# Patient Record
Sex: Female | Born: 1960 | Race: White | Hispanic: No | Smoking: Former smoker
Health system: Southern US, Community
[De-identification: ages and names within clinical notes are randomized; demographics above are authoritative.]

## PROBLEM LIST (undated history)

## (undated) DIAGNOSIS — I509 Heart failure, unspecified: Secondary | ICD-10-CM

## (undated) DIAGNOSIS — K589 Irritable bowel syndrome without diarrhea: Secondary | ICD-10-CM

## (undated) DIAGNOSIS — R0989 Other specified symptoms and signs involving the circulatory and respiratory systems: Secondary | ICD-10-CM

## (undated) DIAGNOSIS — R011 Cardiac murmur, unspecified: Secondary | ICD-10-CM

## (undated) DIAGNOSIS — K56609 Unspecified intestinal obstruction, unspecified as to partial versus complete obstruction: Secondary | ICD-10-CM

## (undated) DIAGNOSIS — I739 Peripheral vascular disease, unspecified: Secondary | ICD-10-CM

## (undated) DIAGNOSIS — Z5189 Encounter for other specified aftercare: Secondary | ICD-10-CM

## (undated) DIAGNOSIS — I350 Nonrheumatic aortic (valve) stenosis: Secondary | ICD-10-CM

## (undated) DIAGNOSIS — M1990 Unspecified osteoarthritis, unspecified site: Secondary | ICD-10-CM

## (undated) DIAGNOSIS — K219 Gastro-esophageal reflux disease without esophagitis: Secondary | ICD-10-CM

## (undated) DIAGNOSIS — E785 Hyperlipidemia, unspecified: Secondary | ICD-10-CM

## (undated) DIAGNOSIS — J449 Chronic obstructive pulmonary disease, unspecified: Secondary | ICD-10-CM

## (undated) DIAGNOSIS — F419 Anxiety disorder, unspecified: Secondary | ICD-10-CM

## (undated) DIAGNOSIS — I209 Angina pectoris, unspecified: Secondary | ICD-10-CM

## (undated) DIAGNOSIS — I219 Acute myocardial infarction, unspecified: Secondary | ICD-10-CM

## (undated) DIAGNOSIS — F209 Schizophrenia, unspecified: Secondary | ICD-10-CM

## (undated) DIAGNOSIS — J45909 Unspecified asthma, uncomplicated: Secondary | ICD-10-CM

## (undated) DIAGNOSIS — I2510 Atherosclerotic heart disease of native coronary artery without angina pectoris: Secondary | ICD-10-CM

## (undated) DIAGNOSIS — J439 Emphysema, unspecified: Secondary | ICD-10-CM

## (undated) DIAGNOSIS — H409 Unspecified glaucoma: Secondary | ICD-10-CM

## (undated) DIAGNOSIS — F329 Major depressive disorder, single episode, unspecified: Secondary | ICD-10-CM

## (undated) DIAGNOSIS — E559 Vitamin D deficiency, unspecified: Secondary | ICD-10-CM

## (undated) DIAGNOSIS — H269 Unspecified cataract: Secondary | ICD-10-CM

## (undated) DIAGNOSIS — R51 Headache: Secondary | ICD-10-CM

## (undated) DIAGNOSIS — C4490 Unspecified malignant neoplasm of skin, unspecified: Secondary | ICD-10-CM

## (undated) DIAGNOSIS — R519 Headache, unspecified: Secondary | ICD-10-CM

## (undated) DIAGNOSIS — C539 Malignant neoplasm of cervix uteri, unspecified: Secondary | ICD-10-CM

## (undated) DIAGNOSIS — R0602 Shortness of breath: Secondary | ICD-10-CM

## (undated) DIAGNOSIS — J189 Pneumonia, unspecified organism: Secondary | ICD-10-CM

## (undated) DIAGNOSIS — K859 Acute pancreatitis without necrosis or infection, unspecified: Secondary | ICD-10-CM

## (undated) DIAGNOSIS — G2581 Restless legs syndrome: Secondary | ICD-10-CM

## (undated) DIAGNOSIS — D649 Anemia, unspecified: Secondary | ICD-10-CM

## (undated) DIAGNOSIS — F319 Bipolar disorder, unspecified: Secondary | ICD-10-CM

## (undated) DIAGNOSIS — I10 Essential (primary) hypertension: Secondary | ICD-10-CM

## (undated) DIAGNOSIS — N185 Chronic kidney disease, stage 5: Secondary | ICD-10-CM

## (undated) DIAGNOSIS — R001 Bradycardia, unspecified: Secondary | ICD-10-CM

## (undated) DIAGNOSIS — Z86010 Personal history of colonic polyps: Secondary | ICD-10-CM

## (undated) DIAGNOSIS — G5600 Carpal tunnel syndrome, unspecified upper limb: Secondary | ICD-10-CM

## (undated) DIAGNOSIS — E079 Disorder of thyroid, unspecified: Secondary | ICD-10-CM

## (undated) DIAGNOSIS — K5792 Diverticulitis of intestine, part unspecified, without perforation or abscess without bleeding: Secondary | ICD-10-CM

## (undated) HISTORY — DX: Heart failure, unspecified: I50.9

## (undated) HISTORY — DX: Hyperlipidemia, unspecified: E78.5

## (undated) HISTORY — DX: Other specified symptoms and signs involving the circulatory and respiratory systems: R09.89

## (undated) HISTORY — DX: Unspecified cataract: H26.9

## (undated) HISTORY — DX: Unspecified asthma, uncomplicated: J45.909

## (undated) HISTORY — DX: Irritable bowel syndrome without diarrhea: K58.9

## (undated) HISTORY — DX: Schizophrenia, unspecified: F20.9

## (undated) HISTORY — DX: Acute pancreatitis without necrosis or infection, unspecified: K85.90

## (undated) HISTORY — DX: Unspecified intestinal obstruction, unspecified as to partial versus complete obstruction: K56.609

## (undated) HISTORY — DX: Nonrheumatic aortic (valve) stenosis: I35.0

## (undated) HISTORY — DX: Anemia, unspecified: D64.9

## (undated) HISTORY — DX: Unspecified glaucoma: H40.9

## (undated) HISTORY — DX: Unspecified osteoarthritis, unspecified site: M19.90

## (undated) HISTORY — DX: Emphysema, unspecified: J43.9

## (undated) HISTORY — DX: Restless legs syndrome: G25.81

## (undated) HISTORY — DX: Encounter for other specified aftercare: Z51.89

## (undated) HISTORY — DX: Unspecified malignant neoplasm of skin, unspecified: C44.90

## (undated) HISTORY — DX: Disorder of thyroid, unspecified: E07.9

## (undated) HISTORY — DX: Anxiety disorder, unspecified: F41.9

## (undated) HISTORY — DX: Personal history of colonic polyps: Z86.010

## (undated) HISTORY — DX: Gastro-esophageal reflux disease without esophagitis: K21.9

## (undated) HISTORY — DX: Malignant neoplasm of cervix uteri, unspecified: C53.9

## (undated) HISTORY — DX: Bipolar disorder, unspecified: F31.9

## (undated) HISTORY — PX: POLYPECTOMY: SHX149

## (undated) HISTORY — DX: Peripheral vascular disease, unspecified: I73.9

## (undated) HISTORY — DX: Vitamin D deficiency, unspecified: E55.9

## (undated) HISTORY — DX: Major depressive disorder, single episode, unspecified: F32.9

## (undated) HISTORY — DX: Bradycardia, unspecified: R00.1

## (undated) HISTORY — PX: UPPER GASTROINTESTINAL ENDOSCOPY: SHX188

## (undated) HISTORY — DX: Essential (primary) hypertension: I10

---

## 1983-10-02 DIAGNOSIS — C539 Malignant neoplasm of cervix uteri, unspecified: Secondary | ICD-10-CM

## 1983-10-02 HISTORY — DX: Malignant neoplasm of cervix uteri, unspecified: C53.9

## 1994-10-01 HISTORY — PX: RIGHT OOPHORECTOMY: SHX2359

## 1994-10-01 HISTORY — PX: APPENDECTOMY: SHX54

## 1995-10-02 HISTORY — PX: TOTAL ABDOMINAL HYSTERECTOMY: SHX209

## 1998-08-13 ENCOUNTER — Emergency Department (HOSPITAL_COMMUNITY): Admission: EM | Admit: 1998-08-13 | Discharge: 1998-08-13 | Payer: Self-pay | Admitting: Emergency Medicine

## 1998-08-13 ENCOUNTER — Encounter: Payer: Self-pay | Admitting: Emergency Medicine

## 1998-09-22 ENCOUNTER — Encounter: Admission: RE | Admit: 1998-09-22 | Discharge: 1998-09-22 | Payer: Self-pay | Admitting: Sports Medicine

## 1998-10-01 DIAGNOSIS — F32A Depression, unspecified: Secondary | ICD-10-CM

## 1998-10-01 DIAGNOSIS — J449 Chronic obstructive pulmonary disease, unspecified: Secondary | ICD-10-CM

## 1998-10-01 HISTORY — DX: Chronic obstructive pulmonary disease, unspecified: J44.9

## 1998-10-01 HISTORY — DX: Depression, unspecified: F32.A

## 1999-08-16 ENCOUNTER — Emergency Department (HOSPITAL_COMMUNITY): Admission: EM | Admit: 1999-08-16 | Discharge: 1999-08-16 | Payer: Self-pay | Admitting: Emergency Medicine

## 1999-09-15 ENCOUNTER — Encounter: Admission: RE | Admit: 1999-09-15 | Discharge: 1999-09-15 | Payer: Self-pay | Admitting: Family Medicine

## 1999-11-06 ENCOUNTER — Emergency Department (HOSPITAL_COMMUNITY): Admission: EM | Admit: 1999-11-06 | Discharge: 1999-11-06 | Payer: Self-pay | Admitting: Internal Medicine

## 1999-11-29 ENCOUNTER — Encounter: Admission: RE | Admit: 1999-11-29 | Discharge: 1999-11-29 | Payer: Self-pay | Admitting: Family Medicine

## 1999-12-07 ENCOUNTER — Ambulatory Visit (HOSPITAL_COMMUNITY): Admission: RE | Admit: 1999-12-07 | Discharge: 1999-12-07 | Payer: Self-pay | Admitting: Family Medicine

## 1999-12-07 ENCOUNTER — Encounter: Admission: RE | Admit: 1999-12-07 | Discharge: 1999-12-07 | Payer: Self-pay | Admitting: Family Medicine

## 1999-12-19 ENCOUNTER — Encounter: Admission: RE | Admit: 1999-12-19 | Discharge: 1999-12-19 | Payer: Self-pay | Admitting: Family Medicine

## 2000-01-01 ENCOUNTER — Ambulatory Visit (HOSPITAL_COMMUNITY): Admission: RE | Admit: 2000-01-01 | Discharge: 2000-01-01 | Payer: Self-pay | Admitting: *Deleted

## 2000-02-12 ENCOUNTER — Encounter: Admission: RE | Admit: 2000-02-12 | Discharge: 2000-02-12 | Payer: Self-pay | Admitting: Family Medicine

## 2001-05-22 ENCOUNTER — Encounter: Payer: Self-pay | Admitting: Emergency Medicine

## 2001-05-22 ENCOUNTER — Emergency Department (HOSPITAL_COMMUNITY): Admission: EM | Admit: 2001-05-22 | Discharge: 2001-05-22 | Payer: Self-pay | Admitting: Emergency Medicine

## 2002-04-28 ENCOUNTER — Emergency Department (HOSPITAL_COMMUNITY): Admission: EM | Admit: 2002-04-28 | Discharge: 2002-04-28 | Payer: Self-pay | Admitting: Emergency Medicine

## 2002-04-28 ENCOUNTER — Encounter: Payer: Self-pay | Admitting: Emergency Medicine

## 2002-11-05 ENCOUNTER — Ambulatory Visit (HOSPITAL_COMMUNITY): Admission: RE | Admit: 2002-11-05 | Discharge: 2002-11-05 | Payer: Self-pay | Admitting: Family Medicine

## 2002-11-06 ENCOUNTER — Emergency Department (HOSPITAL_COMMUNITY): Admission: EM | Admit: 2002-11-06 | Discharge: 2002-11-06 | Payer: Self-pay

## 2003-02-25 ENCOUNTER — Ambulatory Visit (HOSPITAL_COMMUNITY): Admission: RE | Admit: 2003-02-25 | Discharge: 2003-02-25 | Payer: Self-pay | Admitting: Orthopedic Surgery

## 2003-02-25 ENCOUNTER — Encounter: Payer: Self-pay | Admitting: Orthopedic Surgery

## 2003-03-17 ENCOUNTER — Ambulatory Visit (HOSPITAL_COMMUNITY): Admission: RE | Admit: 2003-03-17 | Discharge: 2003-03-17 | Payer: Self-pay | Admitting: Family Medicine

## 2003-12-25 ENCOUNTER — Emergency Department (HOSPITAL_COMMUNITY): Admission: EM | Admit: 2003-12-25 | Discharge: 2003-12-25 | Payer: Self-pay | Admitting: *Deleted

## 2004-02-16 ENCOUNTER — Ambulatory Visit (HOSPITAL_COMMUNITY): Admission: RE | Admit: 2004-02-16 | Discharge: 2004-02-16 | Payer: Self-pay | Admitting: Family Medicine

## 2004-06-13 ENCOUNTER — Ambulatory Visit: Payer: Self-pay | Admitting: Family Medicine

## 2004-06-16 ENCOUNTER — Ambulatory Visit (HOSPITAL_COMMUNITY): Admission: RE | Admit: 2004-06-16 | Discharge: 2004-06-16 | Payer: Self-pay | Admitting: Family Medicine

## 2004-09-02 ENCOUNTER — Encounter: Payer: Self-pay | Admitting: Emergency Medicine

## 2004-09-03 ENCOUNTER — Inpatient Hospital Stay (HOSPITAL_COMMUNITY): Admission: EM | Admit: 2004-09-03 | Discharge: 2004-09-07 | Payer: Self-pay | Admitting: Psychiatry

## 2004-09-03 ENCOUNTER — Ambulatory Visit: Payer: Self-pay | Admitting: Psychiatry

## 2004-09-13 ENCOUNTER — Ambulatory Visit: Payer: Self-pay | Admitting: Family Medicine

## 2004-09-13 ENCOUNTER — Other Ambulatory Visit: Admission: RE | Admit: 2004-09-13 | Discharge: 2004-09-13 | Payer: Self-pay | Admitting: Family Medicine

## 2004-10-24 ENCOUNTER — Ambulatory Visit: Payer: Self-pay | Admitting: Gastroenterology

## 2004-10-25 ENCOUNTER — Ambulatory Visit: Payer: Self-pay | Admitting: Gastroenterology

## 2004-10-27 ENCOUNTER — Ambulatory Visit: Payer: Self-pay | Admitting: Gastroenterology

## 2005-01-26 ENCOUNTER — Ambulatory Visit: Payer: Self-pay | Admitting: Family Medicine

## 2005-05-18 ENCOUNTER — Ambulatory Visit: Payer: Self-pay | Admitting: Family Medicine

## 2006-07-09 ENCOUNTER — Emergency Department (HOSPITAL_COMMUNITY): Admission: EM | Admit: 2006-07-09 | Discharge: 2006-07-09 | Payer: Self-pay | Admitting: Emergency Medicine

## 2006-10-01 DIAGNOSIS — D649 Anemia, unspecified: Secondary | ICD-10-CM

## 2006-10-01 DIAGNOSIS — K589 Irritable bowel syndrome without diarrhea: Secondary | ICD-10-CM

## 2006-10-01 DIAGNOSIS — K5792 Diverticulitis of intestine, part unspecified, without perforation or abscess without bleeding: Secondary | ICD-10-CM

## 2006-10-01 DIAGNOSIS — K56609 Unspecified intestinal obstruction, unspecified as to partial versus complete obstruction: Secondary | ICD-10-CM

## 2006-10-01 HISTORY — DX: Anemia, unspecified: D64.9

## 2006-10-01 HISTORY — PX: COLON SURGERY: SHX602

## 2006-10-01 HISTORY — DX: Diverticulitis of intestine, part unspecified, without perforation or abscess without bleeding: K57.92

## 2006-10-01 HISTORY — DX: Unspecified intestinal obstruction, unspecified as to partial versus complete obstruction: K56.609

## 2006-10-01 HISTORY — DX: Irritable bowel syndrome, unspecified: K58.9

## 2006-10-06 ENCOUNTER — Inpatient Hospital Stay (HOSPITAL_COMMUNITY): Admission: EM | Admit: 2006-10-06 | Discharge: 2006-10-13 | Payer: Self-pay | Admitting: Emergency Medicine

## 2006-11-01 ENCOUNTER — Ambulatory Visit (HOSPITAL_COMMUNITY): Admission: RE | Admit: 2006-11-01 | Discharge: 2006-11-01 | Payer: Self-pay | Admitting: General Surgery

## 2006-11-01 ENCOUNTER — Ambulatory Visit: Payer: Self-pay | Admitting: Family Medicine

## 2006-11-06 ENCOUNTER — Ambulatory Visit (HOSPITAL_COMMUNITY): Admission: RE | Admit: 2006-11-06 | Discharge: 2006-11-06 | Payer: Self-pay | Admitting: Family Medicine

## 2006-11-25 ENCOUNTER — Ambulatory Visit: Payer: Self-pay | Admitting: Family Medicine

## 2006-12-11 ENCOUNTER — Ambulatory Visit (HOSPITAL_COMMUNITY): Admission: RE | Admit: 2006-12-11 | Discharge: 2006-12-11 | Payer: Self-pay | Admitting: Family Medicine

## 2006-12-26 ENCOUNTER — Encounter: Admission: RE | Admit: 2006-12-26 | Discharge: 2006-12-26 | Payer: Self-pay | Admitting: Family Medicine

## 2007-01-04 ENCOUNTER — Inpatient Hospital Stay (HOSPITAL_COMMUNITY): Admission: EM | Admit: 2007-01-04 | Discharge: 2007-01-15 | Payer: Self-pay | Admitting: Emergency Medicine

## 2007-01-20 ENCOUNTER — Ambulatory Visit (HOSPITAL_COMMUNITY): Admission: RE | Admit: 2007-01-20 | Discharge: 2007-01-20 | Payer: Self-pay | Admitting: General Surgery

## 2007-03-03 ENCOUNTER — Inpatient Hospital Stay (HOSPITAL_COMMUNITY): Admission: RE | Admit: 2007-03-03 | Discharge: 2007-03-07 | Payer: Self-pay | Admitting: General Surgery

## 2007-03-03 ENCOUNTER — Encounter (INDEPENDENT_AMBULATORY_CARE_PROVIDER_SITE_OTHER): Payer: Self-pay | Admitting: General Surgery

## 2007-03-26 ENCOUNTER — Ambulatory Visit: Payer: Self-pay | Admitting: Family Medicine

## 2007-03-27 ENCOUNTER — Ambulatory Visit (HOSPITAL_COMMUNITY): Admission: RE | Admit: 2007-03-27 | Discharge: 2007-03-27 | Payer: Self-pay | Admitting: Family Medicine

## 2007-05-26 ENCOUNTER — Ambulatory Visit: Payer: Self-pay | Admitting: Internal Medicine

## 2007-06-13 ENCOUNTER — Ambulatory Visit (HOSPITAL_COMMUNITY): Admission: RE | Admit: 2007-06-13 | Discharge: 2007-06-13 | Payer: Self-pay | Admitting: Family Medicine

## 2007-10-02 DIAGNOSIS — Z86010 Personal history of colonic polyps: Secondary | ICD-10-CM

## 2007-10-02 HISTORY — DX: Personal history of colonic polyps: Z86.010

## 2008-05-11 ENCOUNTER — Emergency Department (HOSPITAL_COMMUNITY): Admission: EM | Admit: 2008-05-11 | Discharge: 2008-05-11 | Payer: Self-pay | Admitting: Emergency Medicine

## 2008-05-17 DIAGNOSIS — F17210 Nicotine dependence, cigarettes, uncomplicated: Secondary | ICD-10-CM | POA: Diagnosis present

## 2008-07-13 ENCOUNTER — Encounter: Payer: Self-pay | Admitting: Gastroenterology

## 2008-08-09 DIAGNOSIS — F329 Major depressive disorder, single episode, unspecified: Secondary | ICD-10-CM

## 2008-08-09 DIAGNOSIS — K5730 Diverticulosis of large intestine without perforation or abscess without bleeding: Secondary | ICD-10-CM | POA: Insufficient documentation

## 2008-08-09 DIAGNOSIS — R519 Headache, unspecified: Secondary | ICD-10-CM | POA: Insufficient documentation

## 2008-08-09 DIAGNOSIS — J45909 Unspecified asthma, uncomplicated: Secondary | ICD-10-CM | POA: Insufficient documentation

## 2008-08-09 DIAGNOSIS — K449 Diaphragmatic hernia without obstruction or gangrene: Secondary | ICD-10-CM | POA: Insufficient documentation

## 2008-08-09 DIAGNOSIS — Z860100 Personal history of colon polyps, unspecified: Secondary | ICD-10-CM | POA: Insufficient documentation

## 2008-08-09 DIAGNOSIS — Z85828 Personal history of other malignant neoplasm of skin: Secondary | ICD-10-CM | POA: Insufficient documentation

## 2008-08-09 DIAGNOSIS — K219 Gastro-esophageal reflux disease without esophagitis: Secondary | ICD-10-CM

## 2008-08-09 DIAGNOSIS — R51 Headache: Secondary | ICD-10-CM

## 2008-08-09 DIAGNOSIS — M129 Arthropathy, unspecified: Secondary | ICD-10-CM | POA: Insufficient documentation

## 2008-08-09 DIAGNOSIS — I10 Essential (primary) hypertension: Secondary | ICD-10-CM | POA: Insufficient documentation

## 2008-08-09 DIAGNOSIS — K222 Esophageal obstruction: Secondary | ICD-10-CM | POA: Insufficient documentation

## 2008-08-09 DIAGNOSIS — F411 Generalized anxiety disorder: Secondary | ICD-10-CM

## 2008-08-10 ENCOUNTER — Ambulatory Visit: Payer: Self-pay | Admitting: Gastroenterology

## 2008-08-10 DIAGNOSIS — K565 Intestinal adhesions [bands], unspecified as to partial versus complete obstruction: Secondary | ICD-10-CM

## 2008-08-10 DIAGNOSIS — Z8679 Personal history of other diseases of the circulatory system: Secondary | ICD-10-CM | POA: Insufficient documentation

## 2008-08-10 DIAGNOSIS — K589 Irritable bowel syndrome without diarrhea: Secondary | ICD-10-CM

## 2008-08-10 DIAGNOSIS — R195 Other fecal abnormalities: Secondary | ICD-10-CM | POA: Insufficient documentation

## 2008-08-10 DIAGNOSIS — D509 Iron deficiency anemia, unspecified: Secondary | ICD-10-CM | POA: Insufficient documentation

## 2008-08-10 DIAGNOSIS — R1084 Generalized abdominal pain: Secondary | ICD-10-CM | POA: Insufficient documentation

## 2008-08-10 LAB — CONVERTED CEMR LAB
Eosinophils Relative: 3.6 % (ref 0.0–5.0)
HCT: 41.4 % (ref 36.0–46.0)
Iron: 60 ug/dL (ref 42–145)
Lymphocytes Relative: 42.6 % (ref 12.0–46.0)
Monocytes Absolute: 0.4 10*3/uL (ref 0.1–1.0)
Monocytes Relative: 6.9 % (ref 3.0–12.0)
Neutrophils Relative %: 46.3 % (ref 43.0–77.0)
Platelets: 349 10*3/uL (ref 150–400)
RDW: 12.5 % (ref 11.5–14.6)
Saturation Ratios: 15 % — ABNORMAL LOW (ref 20.0–50.0)
Transferrin: 286.4 mg/dL (ref >=212.0)
Vitamin B-12: 343 pg/mL (ref 211–911)
WBC: 6.1 10*3/uL (ref 4.5–10.5)

## 2008-09-02 ENCOUNTER — Encounter: Payer: Self-pay | Admitting: Gastroenterology

## 2008-09-02 ENCOUNTER — Ambulatory Visit (HOSPITAL_COMMUNITY): Admission: RE | Admit: 2008-09-02 | Discharge: 2008-09-02 | Payer: Self-pay | Admitting: Gastroenterology

## 2008-09-02 ENCOUNTER — Ambulatory Visit: Payer: Self-pay | Admitting: Gastroenterology

## 2008-09-05 ENCOUNTER — Encounter: Payer: Self-pay | Admitting: Gastroenterology

## 2008-11-09 ENCOUNTER — Encounter: Admission: RE | Admit: 2008-11-09 | Discharge: 2008-11-09 | Payer: Self-pay | Admitting: Family Medicine

## 2009-12-19 ENCOUNTER — Emergency Department (HOSPITAL_COMMUNITY): Admission: EM | Admit: 2009-12-19 | Discharge: 2009-12-19 | Payer: Self-pay | Admitting: Emergency Medicine

## 2010-12-20 ENCOUNTER — Emergency Department (HOSPITAL_COMMUNITY): Payer: Self-pay

## 2010-12-20 ENCOUNTER — Emergency Department (HOSPITAL_COMMUNITY)
Admission: EM | Admit: 2010-12-20 | Discharge: 2010-12-21 | Disposition: A | Discharge status: Home / Self Care | Payer: Self-pay | Attending: Emergency Medicine | Admitting: Emergency Medicine

## 2010-12-20 DIAGNOSIS — R062 Wheezing: Secondary | ICD-10-CM | POA: Insufficient documentation

## 2010-12-20 DIAGNOSIS — J3489 Other specified disorders of nose and nasal sinuses: Secondary | ICD-10-CM | POA: Insufficient documentation

## 2010-12-20 DIAGNOSIS — R509 Fever, unspecified: Secondary | ICD-10-CM | POA: Insufficient documentation

## 2010-12-20 DIAGNOSIS — R0989 Other specified symptoms and signs involving the circulatory and respiratory systems: Secondary | ICD-10-CM | POA: Insufficient documentation

## 2010-12-20 DIAGNOSIS — R059 Cough, unspecified: Secondary | ICD-10-CM | POA: Insufficient documentation

## 2010-12-20 DIAGNOSIS — J438 Other emphysema: Secondary | ICD-10-CM | POA: Insufficient documentation

## 2010-12-20 DIAGNOSIS — R05 Cough: Secondary | ICD-10-CM | POA: Insufficient documentation

## 2010-12-20 DIAGNOSIS — R079 Chest pain, unspecified: Secondary | ICD-10-CM | POA: Insufficient documentation

## 2010-12-20 DIAGNOSIS — R0609 Other forms of dyspnea: Secondary | ICD-10-CM | POA: Insufficient documentation

## 2010-12-20 DIAGNOSIS — J40 Bronchitis, not specified as acute or chronic: Secondary | ICD-10-CM | POA: Insufficient documentation

## 2010-12-20 LAB — BASIC METABOLIC PANEL
BUN: 4 mg/dL — ABNORMAL LOW (ref 6–23)
CO2: 24 mEq/L (ref 19–32)
Glucose, Bld: 98 mg/dL (ref 70–99)
Potassium: 4 mEq/L (ref 3.5–5.1)
Sodium: 137 mEq/L (ref 135–145)

## 2010-12-20 LAB — CBC
HCT: 40.5 % (ref 36.0–46.0)
MCHC: 34.3 g/dL (ref 30.0–36.0)
MCV: 94 fL (ref 78.0–100.0)
Platelets: 399 10*3/uL (ref 150–400)
RDW: 12.9 % (ref 11.5–15.5)

## 2010-12-20 LAB — DIFFERENTIAL
Basophils Absolute: 0.1 10*3/uL (ref 0.0–0.1)
Eosinophils Absolute: 0.1 10*3/uL (ref 0.0–0.7)
Eosinophils Relative: 1 % (ref 0–5)
Lymphocytes Relative: 43 % (ref 12–46)
Lymphs Abs: 3.7 10*3/uL (ref 0.7–4.0)
Monocytes Absolute: 0.8 10*3/uL (ref 0.1–1.0)

## 2010-12-21 LAB — POCT CARDIAC MARKERS
CKMB, poc: <1 ng/mL — ABNORMAL LOW (ref 1.0–8.0)
Troponin i, poc: <0.05 ng/mL (ref 0.00–0.09)

## 2010-12-25 LAB — CBC
HCT: 38 % (ref 36.0–46.0)
Hemoglobin: 12.7 g/dL (ref 12.0–15.0)
Platelets: 375 10*3/uL (ref 150–400)
RBC: 3.93 MIL/uL (ref 3.87–5.11)
WBC: 6.7 10*3/uL (ref 4.0–10.5)

## 2010-12-25 LAB — POCT CARDIAC MARKERS
CKMB, poc: <1 ng/mL — ABNORMAL LOW (ref 1.0–8.0)
CKMB, poc: <1 ng/mL — ABNORMAL LOW (ref 1.0–8.0)
Troponin i, poc: <0.05 ng/mL (ref 0.00–0.09)

## 2010-12-25 LAB — BASIC METABOLIC PANEL
GFR calc Af Amer: >60 mL/min (ref >60)
GFR calc non Af Amer: >60 mL/min (ref >60)
Potassium: 4.3 mEq/L (ref 3.5–5.1)
Sodium: 138 mEq/L (ref 135–145)

## 2010-12-25 LAB — DIFFERENTIAL
Eosinophils Relative: 2 % (ref 0–5)
Lymphocytes Relative: 38 % (ref 12–46)
Lymphs Abs: 2.6 10*3/uL (ref 0.7–4.0)
Monocytes Absolute: 0.4 10*3/uL (ref 0.1–1.0)
Monocytes Relative: 7 % (ref 3–12)

## 2010-12-25 LAB — D-DIMER, QUANTITATIVE: D-Dimer, Quant: 0.4 ug/mL-FEU (ref 0.00–0.48)

## 2011-02-13 NOTE — Consult Note (Signed)
[name], [name]              ACCOUNT NO.:  [id number]   MEDICAL RECORD NO.:  [id number]          PATIENT TYPE:  INP   LOCATION:  [id number]                         FACILITY:  [facility]   PHYSICIAN:  Felizardo Hoffmann, M.D.  DATE OF BIRTH:  05-06-1961   DATE OF CONSULTATION:  03/06/2007  DATE OF DISCHARGE:                                 CONSULTATION   REASON FOR CONSULTATION:  Anxiety and depression.   REQUESTING PHYSICIAN:  Gwenyth Ober, M.D.   HISTORY OF PRESENT ILLNESS:  [name] is a 50 year old female admitted  to [facility] on Feb 17, 2007, due to ongoing problems with  diverticulitis.   Ms. Sistrunk underwent a segmental sigmoid colectomy on March 03, 2007.  She  also had an appendectomy that day and enterolysis. ices   She has insomnia.  Her mood is within normal limits.  She has intact  interests and constructive future goals, however, her energy is  decreased.  She has no thoughts of harming herself or others.  She has  no delusions or hallucinations.  She did not continue her trazodone as  an outpatient.   She also did not continue Cymbalta.  The Cymbalta has just been  restarted this week.  She is not having any adverse medication effects.   PAST PSYCHIATRIC HISTORY:  [name] has no history of increased energy  or decreased need for sleep.  She has a history of several periods of  major depression including anhedonia, poor energy, depressed mood and  difficulty concentrating.   She has undergone psychiatric admission at the [facility] in December 2005.  She underwent treatment for major  depression.  She was treated with Effexor in the past.  She has also  received trazodone.  She was given Seroquel and 150 mg nightly in 2005.  She does not have a history of known psychosis, so evidently, the  Seroquel was for severe anxiety and insomnia.  However, this cannot be  confirmed.   The patient most recently has responded to Cymbalta at  60 mg daily, 50  mg of trazodone nightly, partially helping her with insomnia.   FAMILY PSYCHIATRIC HISTORY:  Alcoholism in several members.   SOCIAL HISTORY:  The patient is divorced.  She has been living alone.  She is to work part-time in Scientist, research (medical), however, she is medically disabled  now.  She has a history of rare use of alcohol without complications.  She used to use marijuana in the past, but no other illegal drugs.  She  does not use marijuana now.  She has one grown daughter.  Education is  through the ninth grade.  Religion is [employer].   PAST MEDICAL HISTORY:  1. Chronic diverticulitis, status post segmental sigmoid colectomy.  2. Status post appendectomy.  3. Status post enterolysis.   MEDICATIONS:  The MAR is reviewed.  The patient is on Cymbalta 60 mg  daily.   ALLERGIES:  VICODIN.   LABORATORY DATA:  Electrolytes are unremarkable with BUN 1, creatinine  0.61.  WBC 8.3, hemoglobin 14.3, platelet count 456.   REVIEW  OF SYSTEMS:  CONSTITUTIONAL:  Afebrile.  HEAD:  No trauma.  EYES:  No visual changes.  EARS:  No hearing impairment.  NOSE:  No rhinorrhea.  THROAT:  No sore throat.  NEUROLOGIC:  Unremarkable.  PSYCHIATRIC:  As  above.  CARDIOVASCULAR:  No chest pain, palpitations or edema.  RESPIRATORY:  No coughing or wheezing.  GASTROINTESTINAL:  As above.  The patient has slight loose stools now, but her stools are becoming  more solid.  GENITOURINARY:  No dysuria.  SKIN:  Unremarkable.  HEMATOLOGIC/LYMPHATIC:  Unremarkable.  MUSCULOSKELETAL:  No deformities.  ENDOCRINE/METABOLIC unremarkable.   PHYSICAL EXAMINATION:  VITAL SIGNS:  Temperature 98.6, pulse 74,  respirations 16, blood pressure 137/75, O2 saturation on room air 98%.   MENTAL STATUS EXAM:  Ms. Stbernard is a middle-aged female partially  reclined in supine position in her hospital bed with good eye contact.  She is alert.  She is socially appropriate and cooperative.  She is  oriented to all spheres.  Her  memory is intact to immediate recent and  remote.  Fund of knowledge and intelligence are within normal limits.  Concentration is slightly decreased.  Affect is mildly constricted.  Mood is within normal limits.  Insight is intact.  Judgment is intact.   ASSESSMENT:  AXIS I:  1. (293.84) Anxiety disorder, not otherwise  specified.  1. (296.35) Major depressive disorder, recurrent, in partial      remission.  AXIS II:  Deferred.  AXIS III:  See general medical problems.  AXIS IV:  General medical.  AXIS V:  55.   IMPRESSION:  [name] is not at risk to harm herself or others.  She  agrees to call emergency services immediately for any thoughts of  harming herself, thoughts of harming others or distress.   The undersigned provided ego supportive psychotherapy and education.   The indications, alternatives and adverse effects of Cymbalta and  trazodone were discussed with the patient.  She understands and wants to  proceed as follows.   RECOMMENDATIONS:  1. Would continue Cymbalta 60 mg q.a.m. for antidepression.  Also, the      [place] component of Cymbalta should be effective in helping with      anxiety.  2. To help with insomnia and also augment Cymbalta, would utilize      trazodone and 50 mg nightly with 50 mg nightly p.r.n.  Would then      adjust trazodone as needed and as tolerated until sleep is normal      not to exceed 200      mg nightly.  3. Outpatient psychiatric followup can be obtained at one of the      clinics attached to [place] or [facility].      Felizardo Hoffmann, M.D.  Electronically Signed     JW/MEDQ  D:  03/09/2007  T:  03/10/2007  Job:  [id number]

## 2011-02-13 NOTE — Discharge Summary (Signed)
[name], [name]              ACCOUNT NO.:  [id number]   MEDICAL RECORD NO.:  [id number]          PATIENT TYPE:  INP   LOCATION:  [id number]                         FACILITY:  [facility]   PHYSICIAN:  Gwenyth Ober, M.D.    DATE OF BIRTH:  Aug 01, 1961   DATE OF ADMISSION:  03/03/2007  DATE OF DISCHARGE:  03/07/2007                               DISCHARGE SUMMARY   DISCHARGE DIAGNOSIS:  Chronic and acute diverticulitis.   ADDITIONAL DIAGNOSES:  Include:  1. Depression.  2. Tobacco or nicotine dependence.   PRINCIPLE PROCEDURE:  Was a sigmoid colectomy with primary anastomosis  done by Dr. Hulen Skains.   DISCHARGE MEDICATIONS:  1. Percocet 10/325 mg with acetaminophen, 1 tablet orally every 4      hours as needed for pain.  2. Reglan 2 mg, 1 tablet twice a day.  3. Trazodone 100 mg 1 tablet at bedtime.  4. Cymbalta 60 mg 1 tablet p.o. daily.  5. Nicotine patch 1 mg, one topically every day.   FOLLOW-UP:  To see Dr. Hulen Skains in two weeks.  She is to have her staples  removed and Steri-Strips applied today, prior to discharge.  She is to  do activity wise and no heavy lifting for another 4 weeks.  Should  return to work in 3 weeks with light duty, increase her activity slowly.  She can shower and pat her wound dry.   BRIEF SUMMARY OF HOSPITAL COURSE:  The patient was admitted on the day  of surgery on March 03, 2007, at which time she underwent an sigmoid  colectomy with colon with0 a primary anastomosis.  The procedure was  uneventful.  She had a hand-sewn anastomosis.  Post-op day #1, she was  started on sips of clear liquids and advanced to full clear liquids on  post-op day #2.  She was advanced to full liquids, and then a soft diet  and post-op day #3.   Dr. Rhona Raider saw her in follow-up for her previous depression related  to some home issues.  He recommended increasing her trazodone to 100 mg  at bedtime.  She was already on Cymbalta and 50 mg of trazodone prior to  that.  Post-op day  #4, she was doing well.  Her wound was __________  well with no evidence of infection.  Staples were to be removed and  Steri-Strips applied.  She was to follow up to see Dr. Hulen Skains in 2 weeks.   DIET ON DISCHARGE:  Soft.  She can advance that to a regular diet.   Condition is stable.      Gwenyth Ober, M.D.  Electronically Signed     JOW/MEDQ  D:  03/07/2007  T:  03/07/2007  Job:  [id number]

## 2011-02-13 NOTE — Op Note (Signed)
NAMEDANNELLE, BARGERSTOCK              ACCOUNT NO.:  [id number]   MEDICAL RECORD NO.:  [id number]          PATIENT TYPE:  INP   LOCATION:  2550                         FACILITY:  [facility]   PHYSICIAN:  Gwenyth Ober, M.D.    DATE OF BIRTH:  1961/05/24   DATE OF PROCEDURE:  03/03/2007  DATE OF DISCHARGE:                               OPERATIVE REPORT   PREOP DIAGNOSIS:  Sigmoid diverticulitis.   POSTOP DIAGNOSIS:  1. Sigmoid diverticulitis, chronic.  2. Multiple lower abdominal adhesions.  3. Retrocecal appendage.   PROCEDURE:  1. Segmental sigmoid colectomy.  2. Appendectomy.  3. Enterolysis.   SURGEON:  Gwenyth Ober, M.D.   ASSISTANT:  Jacelyn Pi, M.D.   ANESTHESIA:  General endotracheal.   ESTIMATED BLOOD LOSS:  Less than 50 mL.   COMPLICATIONS:  None.   CONDITION:  Stable.   INDICATIONS FOR OPERATION:  The patient is a 50 year old with a history  of substance abuse, but prior admissions for diverticulitis who comes in  for sigmoid colectomy.   FINDINGS:  [name] mid-to-distal sigmoid colon was chronically  inflamed and scarred and adhesed to the left pelvic brim.  We could  easily separate it from the ureter on that side.   The patient had multiple adhesions of the distal ileum in the right  lower quadrant, likely secondary to previous surgery for hysterectomy.  The patient also had a retrocecal appendix which because of its  position, if it had become inflamed may have been problematic.   OPERATION:  The patient was taken to the operating room and placed on he  table in the supine position.  After an adequate endotracheal anesthetic  was administered, she was prepped and draped in the usual sterile manner  thus closing the midline of the abdomen.   The incision was made from just the left of the umbilicus all the way  down to the pelvic brim; and was taken down through the midline fascia.  A Balfour retractor was placed and the patient was placed in  Trendelenburg position.   There were adhesions of small bowel to the right lower quadrant of the  pelvis which were taken down sharply with Metzenbaum scissors.  This  freed up the terminal ileum entering into the cecum which appeared to be  normal, but the appendix tracked out posteriorly and deeply  retroperitoneally; and, therefore, decision was made to remove it  because of the possibility of appendicitis; and its difficult position  retrocecal.  We mobilized the mesoappendix and then ligated at its base  with a 2-0 silk.  The base of the appendix was transected using a GIA-75  stapler which was used subsequently in order to transect the colon.   The area of the diverticulitis which had apparently been causing the  patient problems was adhesed to the pelvic brim.  We took it down with  Metzenbaum scissors; and also electrocautery.  Care was taken not to  injure the left ureter which could be visualized as a tract over the  iliac vessels.  We found a place in the proximal sigmoid colon where  we  came across it with a GIA-75 stapler.  The mesentery of the sigmoid  colon was taken using a LigaSure device with curved tips.  The distal  incision was just distal to the area of diverticulitis; and we came  across that area also with the GIA-75 stapler.  Again, the intervening  mesentery was taken with the LigaSure device.  We subsequently did a  hand-sewn, two-layer anastomosis between the proximal sigmoid; and the  distal sigmoid using Lembert back wall and anterior wall stitches of 3-0  silk; and also internal posterior wall running and then Connell  stitches, anteriorly, of 3-0 Vicryl.  A spring clamp was placed on the  proximal bowel during anastomosis with the bowel open.  The mesentery  was subsequently closed using interrupted 2-0 silk sutures.  Care was  taken not to snare any important structures during their closure.  The  ureter continued to be intact and uninjured.   We  changed our gloves and then irrigated the pelvis with warm saline  solution.  We then, removed the Balfour retractors; and closed the  abdomen using a running #1 PDS suture.  No drains were placed.  All  needle counts, sponge counts, and instrument counts were correct.  Sterile dressing was applied.      Gwenyth Ober, M.D.  Electronically Signed     JOW/MEDQ  D:  03/03/2007  T:  03/03/2007  Job:  [id number]   cc:   Felizardo Hoffmann, M.D.  Cherene Altes, M.D.
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
## 2011-02-16 NOTE — H&P (Signed)
[name], [name]              ACCOUNT NO.:  [id number]   MEDICAL RECORD NO.:  [id number]          PATIENT TYPE:  INP   LOCATION:  5707                         FACILITY:  [facility]   PHYSICIAN:  Lucy Chris, MD     DATE OF BIRTH:  December 17, 1960   DATE OF ADMISSION:  01/03/2007  DATE OF DISCHARGE:                              HISTORY & PHYSICAL   CHIEF COMPLAINT:  Abdominal pain.   HISTORY OF PRESENT ILLNESS:  The patient is a 50 year old woman with a  recent hospitalization in January of this year for diverticulitis with  microperforation and abscess formation, who presents to the emergency  room today because of two to three days of severe constipation and one  week of abdominal pain which she describes as sharp, severe, with  radiation throughout the abdomen.  She is unable to note anything that  makes it better or worse.  She does have two to three episodes of emesis  over the past week, but evidently this is not new for her.  She also  reports subjective fevers and hematochezia.  She denies any hematemesis,  though.   PAST MEDICAL HISTORY:  1. Sigmoid diverticulitis with microperforation and abscess in January      of 2008.  2. Irritable bowel syndrome.  3. Depression.  4. Right clavicular fracture.  5. Chronic obstructive pulmonary disease not currently on any      medications.  6. Status post total abdominal hysterectomy.  7. Normocytic anemia with hemoglobin of 11.1 during her      hospitalization in January.   FAMILY HISTORY:  Father is currently living, with history of  bone  cancer.  Mother is deceased from emphysema.   SOCIAL HISTORY:  She is divorced, lives alone, is currently seeking  Medicaid.  She does work part time in Scientist, research (medical).  She continues to smoke  two to three packs per day, and denies any alcohol use.   CURRENT MEDICATIONS:  None.   DRUG ALLERGIES:  No known drug allergies.   DRUG INTOLERANCES:  Vicodin causes nausea.   REVIEW OF SYSTEMS:  As per  HPI.  Positive for subjective fevers, chills,  fatigue.  Negative for chest pain, shortness of breath, cough, lower  extremity swelling, orthopnea, nocturia. Positive for abdominal pain,  mild nausea, emesis - this does not appear to be new.  Positive for  hematochezia.  Negative for melena.  Negative for hematemesis.  Negative  for dysuria.   PHYSICAL EXAMINATION:  VITAL SIGNS:  Temperature 97.8, pulse 68, blood  pressure 105/60, respiratory rate 18.  The patient is satting 97% on  room air.  GENERAL:  She appears older than stated age.  She is in no acute  distress.  CARDIOVASCULAR:  Distant, but regular rate and rhythm with no  appreciable murmurs, rubs, or gallops.  RESPIRATORY:  Respirations are with bibasilar rhonchi, with fair air  movement.  ABDOMEN:  Decreased bowel sounds, soft, diffuse tenderness to palpation,  greatest in the left lower quadrant.  No rebound, no guarding.  GU EXAM:  Deferred.  EXTREMITIES:  Without any edema or cyanosis.  PSYCHIATRIC EXAM:  She is very tearful and tangential, with slightly  pressured speech.   LABORATORY STUDIES:  She has a white blood cell count of 16 with a left  shift.  She has a hemoglobin of 14.6, platelet count of 340,000.  Sodium  139, potassium 3.9, chloride 108, bicarb 24, BUN 1, creatinine 0.5.  LFTs are all within normal limits.  Anion gap is 7.  Urinalysis is  negative.  CT of the pelvis questions recurrent diverticulitis and  possible underlying sigmoid cancer.   ASSESSMENT AND PLAN:  1. Diverticulitis.  Acutely, we will admit her to the hospital, keep      her n.p.o., hydrate with normal saline, and treat with      ciprofloxacin and metronidazole as this is the patient's second      attack in a relatively short period of time.  She will likely      require a surgical evaluation after this acute episode to discuss      removing part of the sigmoid colon.  Evidently, this has been      broached with her at some point  already.  2. Rhonchi on exam.  The patient appears to be asymptomatic.  She does      not have any wheezing to indicate this is from a chronic      obstructive pulmonary disease exacerbation.  Will start with a      chest x-ray and proceed from there.  Of note, she is currently      satting very well on room air and, again, is asymptomatic with this      physical exam finding.  3. Chronic obstructive pulmonary disease.  Again, appears to be      stable, although she does have some rhonchi on exam.  Will start      with a chest x-ray and follow her from there.  4. Chronic tobacco use.  Will ask for a smoking cessation consult and      encourage cessation while she is in the hospital.      Lucy Chris, MD  Electronically Signed     KK/MEDQ  D:  01/04/2007  T:  01/04/2007  Job:  [id number]

## 2011-02-16 NOTE — Discharge Summary (Signed)
NAMEEBONEE, OLEARY              ACCOUNT NO.:  [id number]   MEDICAL RECORD NO.:  [id number]          PATIENT TYPE:  INP   LOCATION:  3021                         FACILITY:  [facility]   PHYSICIAN:  Cherene Altes, M.D.DATE OF BIRTH:  Feb 07, 1961   DATE OF ADMISSION:  10/06/2006  DATE OF DISCHARGE:  10/13/2006                               DISCHARGE SUMMARY   PRIMARY CARE PHYSICIAN:  [facility].   GENERAL SURGEON:  Gwenyth Ober, M.D.   DISCHARGE DIAGNOSES:  1. Sigmoid diverticulitis with micro perforation and abscess.      a.     Clinical symptoms significantly improved.      b.     Confirmed resolving abscess by followup CT scan.  2. Irritable bowel syndrome.  3. History of depression with previous suicidal ideation.  4. Fracture of the right clavicle, October, under ongoing care by      orthopedic surgery.  5. Chronic obstructive pulmonary disease with ongoing tobacco abuse.  6. Status post total abdominal hysterectomy.  7. Normocytic anemia felt to be secondary to gastrointestinal blood      loss related to diverticular bleeding, will need long term      outpatient followup.   DISCHARGE MEDICATIONS:  1. Ciprofloxacin 500 mg b.i.d. for 10 days then stop.  2. Flagyl 500 mg p.o. t.i.d. for 10 days then stop.  3. Percocet 2 tablets every four hours p.r.n. pain.   FOLLOWUP:  1. The patient will follow up with Dr. Judeth Horn at [facility] in 10-14 days for a recheck.  2. The patient is also advised to follow up at [facility] in 7-14 days for routine medical followup and re-evaluation      of her anemia.   PROCEDURES:  1. A CT scan of the abdomen and pelvis on October 08, 2006:  Sigmoid      colon diverticulitis and small adjacent diverticular abscess of      approximately 12 x 16-mm with normal-appearing appendix.  2. A CT scan of the abdomen and pelvis on October 12, 2006:  Improving      diverticulitis and diminished abscess  size.   HISTORY OF PRESENT ILLNESS:  For details concerning the patient's  admission, please see history and physical dictated by Dr. Gwynneth Aliment on  October 06, 2006.   HOSPITAL COURSE:  Ms. Charidy Helmus is a pleasant 50 year old female  with a known history of diverticulosis diagnosed via colonoscopy in  2005.  She has suffered with at least 2-3 episodes of diverticulitis in  the interim since that time.  She usually follows a very close  diverticular appropriate diet.  Over the last week to two weeks, she has  had significant social stress and has become noncompliant with her diet.  She then began to notice the insidious onset of crampy left lower  quadrant abdominal pain.  She ultimately presented to the hospital  because of progression of pain and some episodes of bright red blood per  rectum.  A CT scan of the abdomen revealed significant sigmoid  diverticulitis with a diverticular abscess.  The patient was kept on NPO  status and antibiotics were initiated.  A surgery consult was  accomplished and agreed with medical therapy.  With IV antibiotics and  judicious diet restrictions, the patient improved significantly.  By  October 12, 2006, the patient's diet was able to be advanced to regular.  A CT scan of the abdomen on the same day revealed a decrease in size of  the small abscess and evidence of improving sigmoid diverticulitis.   The patient is to be discharged on a strict diverticular appropriate  diet.  She will complete 10 additional days or oral antibiotics.  She  will follow up with Dr. Hulen Skains as described above for consideration of  possible elective partial colectomy in the future.  She is also advised  to follow up with her primary care physician at [facility] for ongoing  medical care.   During this hospitalization, it was appreciated that the patient was  mildly anemic with a hemoglobin ranging from 11.5 to 12.  MCV was at the  upper limits of normal.  B12 was  obtained and was found to be normal.  It is felt that this patient's anemia is likely an anemia of acute blood  loss due to her recent GI bleeding of a diverticular nature.  Outpatient  followup is recommended at [facility] with further management  options to be made based upon followup lab values.   The patient does have a known history of COPD and continues to smoke.  Tobacco cessation consultation was offered during this hospital stay.  At the time of her discharge, there was no evidence of active wheezing  and it was not felt that long term inhalers are a necessity at the  present time.      Cherene Altes, M.D.  Electronically Signed     JTM/MEDQ  D:  10/12/2006  T:  10/12/2006  Job:  [id number]   cc:   Rock Nephew, M.D.

## 2011-02-16 NOTE — Discharge Summary (Signed)
[name], [name]              ACCOUNT NO.:  [id number]   MEDICAL RECORD NO.:  [id number]          PATIENT TYPE:  INP   LOCATION:  [id number]                         FACILITY:  [facility]   PHYSICIAN:  Cherene Altes, M.D.DATE OF BIRTH:  [birth year]   DATE OF ADMISSION:  01/03/2007  DATE OF DISCHARGE:  01/15/2007                               DISCHARGE SUMMARY   ADDENDUM:   PRIMARY CARE PHYSICIAN:  Unassigned.   GENERAL SURGEON:  Gwenyth Ober, M.D.   PSYCHIATRIST:  Felizardo Hoffmann, M.D.   DISCHARGE MEDICATIONS:  1. Ciprofloxacin 500 mg p.o. b.i.d. for 10 days.  2. Flagyl 500 mg p.o. b.i.d. for 10 days.  3. Trazodone 50 mg q.h.s.  4. Cymbalta 20 mg daily.  5. Phenergan 25 mg q.6 h. p.r.n.  6. Percocet 5/325 one to two q.4-6 h. as needed with a maximum of 10      tablets per day.   FOLLOWUP:  The patient is instructed to follow up with Dr. Hulen Skains as he  is directed her.  She is to call his office at [phone] to arrange for  surgery in approximately 1 week.  She has discussed this plan herself  with Dr. Hulen Skains and assures me that she is aware of what her followup  issues are.   DISCHARGE DIAGNOSES:  No change from previously dictated discharge  summary.   HOSPITAL COURSE:  On January 15, 2007, the patient was deemed to be stable  for discharge home.  Her vital signs were stable and she was afebrile.  She was able to tolerate a regular diet.  Followup plans have been made  with Dr. Chucky May of general surgery for the patient to return in one  week for consideration of surgery.      Cherene Altes, M.D.  Electronically Signed     JTM/MEDQ  D:  01/15/2007  T:  01/15/2007  Job:  [id number]   cc:   Gwenyth Ober, M.D.  Felizardo Hoffmann, M.D.
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
## 2011-02-16 NOTE — Consult Note (Signed)
[name], Collins              ACCOUNT NO.:  [id number]   MEDICAL RECORD NO.:  [id number]          PATIENT TYPE:  INP   LOCATION:  [id number]                         FACILITY:  [facility]   PHYSICIAN:  Felizardo Hoffmann, M.D.  DATE OF BIRTH:  1961-05-18   DATE OF CONSULTATION:  01/07/2007  DATE OF DISCHARGE:                                 CONSULTATION   REQUESTING PHYSICIAN:  Mobolaji B. Maia Petties, M.D.   REASON FOR CONSULTATION:  Depression, insomnia.   HISTORY OF PRESENT ILLNESS:  [name] is a 50 year old female admitted  to the [facility] on January 03, 2007.  She has been experiencing  increased medical problems including abdominal pain.   She has several weeks of depressed mood, decreased energy, lack of  interest, decreased concentration and insomnia.  She has no thoughts of  harming herself.  She has no thoughts of harming others.  She has no  delusions.  She has no hallucinations.   PAST PSYCHIATRIC HISTORY:  [name] does not have a history of mania.  She has a history of several depressions in the past.   [facility].  At that time, she was treated for major depression.  She was  treated with trazodone 100 mg at night.  She also was given Seroquel 150  mg daily.  Evidently, the Seroquel was given for severe anxiety at that  time.   She also has a history of being tried on Effexor.   FAMILY PSYCHIATRIC HISTORY:  Several are alcoholics.   SOCIAL HISTORY:  Education:  Ninth grade.  Occupation:  Medically  disabled for emphysema.  The patient has one grown daughter.  She has  used marijuana in the remote past but no other illegal drugs.  She has  rarely used alcohol.  She is divorced.  She lives alone.  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   GENERAL MEDICAL PROBLEMS:  1. Sigmoid diverticulitis.  2. Irritable bowel syndrome.  3. Right clavicular fracture.  4. Chronic obstructive  pulmonary disease.  5. History of abdominal hysterectomy.  6. Normocytic anemia.   MEDICATIONS:  The MAR is reviewed.   ALLERGIES:  IS TO VICODIN.   LABORATORY DATA:  WBC 5.7, hemoglobin 10.6, platelet count 337, BUN 3,  creatinine 0.57.   REVIEW OF SYSTEMS:  Noncontributory.   PHYSICAL EXAMINATION:  VITAL SIGNS:  Temperature is 98.5, pulse 87,  respiration 20, blood pressure 141/62, O2 saturation on room air 96%.   MENTAL STATUS EXAM:  [name] is alert.  She is oriented to all  spheres.  Her memory is intact to immediate, recent and remote.  Her  fund of knowledge and intelligence are within normal limits.  Her affect  is constricted.  Her mood is depressed.  Her speech involves normal rate  and prosody.  Thought process:  Logical, coherent, goal-directed.  No  looseness of associations.  Thought content:  No thoughts of harming  herself, no thoughts of harming others.  No delusions, no  hallucinations.  Her concentration is mildly  decreased.  Her insight is  good.  Her judgment is intact.  She is socially appropriate.  Her eye  contact is good.   ASSESSMENT:  AXIS I:  Major depressive disorder recurrent, severe.  293.84, anxiety disorder, not otherwise specified.  AXIS II:  Deferred.  AXIS III:  See general medical problems.  AXIS IV:  General medical and primary support group.  AXIS V:  55.   [name] is not at risk to harm herself or others.   She agrees to use emergency services immediately for any thoughts of  harming herself, thoughts of harming others or distress.   The undersigned provided ego supportive psychotherapy and education.   The indications, alternatives and adverse effects of trazodone and  Cymbalta were discussed with the patient.  She understands and would  like to proceed as below.   RECOMMENDATIONS:  1. Start Cymbalta at 20 mg p.o. q.a.m. and then would increase as      tolerated to 30 mg p.o. b.i.d. at 8:00 a.m. and 4:00 p.m. within       the next 10 days.  2. Would use trazodone 50-100 mg every night for insomnia.  This may      be adjusted by 50 mg per day as needed for insomnia up to an      estimated effective dose of 100-150 mg every night.  It is      estimated that she will not need more than 300 mg every night.  3. Regarding outpatient follow-up, would ask the case manager to set      this patient up with an appointment at one of the psychiatric      clinics attached to [facility] or [facility].  Also of the [facility] mental [facility] is available.      Other sources of counseling include [facility].      Felizardo Hoffmann, M.D.  Electronically Signed     JW/MEDQ  D:  01/11/2007  T:  01/11/2007  Job:  [id number]
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
## 2011-02-16 NOTE — Discharge Summary (Signed)
[name], [name]              ACCOUNT NO.:  [id number]   MEDICAL RECORD NO.:  [id number]          PATIENT TYPE:  INP   LOCATION:  [id number]                         FACILITY:  [facility]   PHYSICIAN:  Aquilla Hacker, M.D. DATE OF BIRTH:  November 09, 1960   DATE OF ADMISSION:  01/03/2007  DATE OF DISCHARGE:                               DISCHARGE SUMMARY   PRIMARY CARE DOCTOR:  Unassigned.   FINAL DIAGNOSES:  1. Recurring acute diverticulitis.  2. Urine drug screen positive for cocaine.  3. Urine drug screen positive for marijuana.  4. Urine drug screen positive for benzodiazepine.  5. Major depressive disorder.  6. Anxiety disorder.   CONSULTATIONS:  1. General surgery with Dr. Hulen Skains.  2. Psychiatry with Dr. Rhona Raider.   PROCEDURES:  1. CT scan of the abdomen and pelvis completed January 03, 2007.  2. Acute abdominal x-rays completed January 03, 2007.  3. Portable chest x-ray completed January 04, 2007.  4. CT scan of the abdomen and pelvis completed January 09, 2007   HISTORY OF PRESENT ILLNESS:  Ms. [name] is a 50 year old female who was  admitted secondary to chief complaint of abdominal pain.  She indicated  that two to three days prior to this admission.  She had developed  severe constipation, accompanied by at least one week of abdominal pain.  It was rather sharp and severe with radiation throughout her abdomen.   PAST MEDICAL HISTORY:  Please see that dictated by Dr. Lucy Chris.   HOSPITAL COURSE BY PROBLEMS:  Problem #1:  RECURRING DIVERTICULITIS.  A  CT scan of the patient's abdomen and pelvis was completed on January 03, 2007.  This revealed mild inflammatory stranding around the sigmoid  colon in the region of several small peripheral diverticula.  The  radiologist indicated that this more than likely represented a recurrent  diverticulitis.  It appeared to be in a similar location as that seen  previously.  The patient was started on empiric Ciprofloxacin along with  metronidazole.  General surgery was consulted and Dr. Hulen Skains has been  following the patient.  It appeared that the patient had initially been  conflicted.  With regards to whether or not she wanted to pursue a  surgical intervention. Over the latter portion of the hospitalization  she convinced herself that surgery would be the best avenue to pursue at  this time.  She has been maintained on around-the-clock pain medications  IV.  She has indicated that despite being given pain medications, her  abdominal pain has never completely resolved.  Repeat CAT scan of the  abdomen done on January 09, 2007 revealed no acute abdominal finding.  CAT  scan of the patient's pelvis revealed a nearly normal exam; the colonic  wall show some muscular hypertrophy, consistent with the patient's known  diverticulosis, but no inflammatory changes surrounding the colon were  seen.  Over the past few days the patient has remained afebrile.  Her  white count has been normal.  General surgery has indicated that they  may operate on the patient within the next week.   Problem #2:  DIARRHEA.  The patient has indicated that she has had  ongoing diarrhea, throughout her hospitalization.  Stool studies have  been sent; and they were negative for Giardia, and cryptosporidium.  C.  difficile was sent x3.  Each of the three samples sent for C. difficile  was negative; this is been monitored.   Problem #3:  DEPRESSION.  Dr. Rhona Raider has seen the patient on April 8,  he indicated that the patient suffers from Major depressive disorder,  which is recurrent and severe, as well as an anxiety disorder.  He  recommended that Cymbalta be initiated.  Trazodone also should be  initiated at night time for insomnia.   Problem #4:  URINE DRUG SCREEN POSITIVE for cocaine, marijuana, and  benzodiazepine.  The patient has been counseled against the use of  street drugs.   This brings me up to January 14, 2007.  It appears that the  patient is  being considered to undergo surgery, a week from now.      Aquilla Hacker, M.D.  Electronically Signed     OR/MEDQ  D:  01/14/2007  T:  01/14/2007  Job:  [id number]   cc:   Gwenyth Ober, M.D.

## 2011-07-19 LAB — BASIC METABOLIC PANEL
BUN: 1 — ABNORMAL LOW
GFR calc non Af Amer: >60
Glucose, Bld: 71
Potassium: 3.7

## 2011-10-02 DIAGNOSIS — I10 Essential (primary) hypertension: Secondary | ICD-10-CM

## 2011-10-02 DIAGNOSIS — E785 Hyperlipidemia, unspecified: Secondary | ICD-10-CM

## 2011-10-02 DIAGNOSIS — K859 Acute pancreatitis without necrosis or infection, unspecified: Secondary | ICD-10-CM

## 2011-10-02 HISTORY — DX: Hyperlipidemia, unspecified: E78.5

## 2011-10-02 HISTORY — DX: Essential (primary) hypertension: I10

## 2011-10-02 HISTORY — DX: Acute pancreatitis without necrosis or infection, unspecified: K85.90

## 2011-10-16 ENCOUNTER — Emergency Department (HOSPITAL_COMMUNITY)
Admission: EM | Admit: 2011-10-16 | Discharge: 2011-10-16 | Disposition: A | Discharge status: Home / Self Care | Payer: Self-pay | Attending: Emergency Medicine | Admitting: Emergency Medicine

## 2011-10-16 ENCOUNTER — Encounter (HOSPITAL_COMMUNITY): Payer: Self-pay | Admitting: Physical Medicine and Rehabilitation

## 2011-10-16 ENCOUNTER — Emergency Department (HOSPITAL_COMMUNITY): Payer: Self-pay

## 2011-10-16 ENCOUNTER — Other Ambulatory Visit: Payer: Self-pay

## 2011-10-16 DIAGNOSIS — R0602 Shortness of breath: Secondary | ICD-10-CM | POA: Insufficient documentation

## 2011-10-16 DIAGNOSIS — J449 Chronic obstructive pulmonary disease, unspecified: Secondary | ICD-10-CM | POA: Insufficient documentation

## 2011-10-16 DIAGNOSIS — M79609 Pain in unspecified limb: Secondary | ICD-10-CM | POA: Insufficient documentation

## 2011-10-16 DIAGNOSIS — R6883 Chills (without fever): Secondary | ICD-10-CM | POA: Insufficient documentation

## 2011-10-16 DIAGNOSIS — J4489 Other specified chronic obstructive pulmonary disease: Secondary | ICD-10-CM | POA: Insufficient documentation

## 2011-10-16 DIAGNOSIS — R079 Chest pain, unspecified: Secondary | ICD-10-CM | POA: Insufficient documentation

## 2011-10-16 DIAGNOSIS — R6884 Jaw pain: Secondary | ICD-10-CM | POA: Insufficient documentation

## 2011-10-16 DIAGNOSIS — Z7982 Long term (current) use of aspirin: Secondary | ICD-10-CM | POA: Insufficient documentation

## 2011-10-16 DIAGNOSIS — R109 Unspecified abdominal pain: Secondary | ICD-10-CM | POA: Insufficient documentation

## 2011-10-16 DIAGNOSIS — R110 Nausea: Secondary | ICD-10-CM | POA: Insufficient documentation

## 2011-10-16 HISTORY — DX: Diverticulitis of intestine, part unspecified, without perforation or abscess without bleeding: K57.92

## 2011-10-16 HISTORY — DX: Chronic obstructive pulmonary disease, unspecified: J44.9

## 2011-10-16 HISTORY — DX: Carpal tunnel syndrome, unspecified upper limb: G56.00

## 2011-10-16 LAB — LIPASE, BLOOD: Lipase: 93 U/L — ABNORMAL HIGH (ref 11–59)

## 2011-10-16 LAB — DIFFERENTIAL
Basophils Relative: 1 % (ref 0–1)
Eosinophils Absolute: 0.2 10*3/uL (ref 0.0–0.7)
Eosinophils Relative: 2 % (ref 0–5)
Monocytes Absolute: 0.6 10*3/uL (ref 0.1–1.0)
Monocytes Relative: 6 % (ref 3–12)
Neutrophils Relative %: 64 % (ref 43–77)

## 2011-10-16 LAB — CBC
Hemoglobin: 14.3 g/dL (ref 12.0–15.0)
MCH: 32.9 pg (ref 26.0–34.0)
MCHC: 34.8 g/dL (ref 30.0–36.0)

## 2011-10-16 LAB — COMPREHENSIVE METABOLIC PANEL
Albumin: 4.3 g/dL (ref 3.5–5.2)
BUN: 7 mg/dL (ref 6–23)
Calcium: 9.9 mg/dL (ref 8.4–10.5)
Creatinine, Ser: 0.52 mg/dL (ref 0.50–1.10)
Total Protein: 8 g/dL (ref 6.0–8.3)

## 2011-10-16 LAB — POCT I-STAT TROPONIN I: Troponin i, poc: 0 ng/mL (ref 0.00–0.08)

## 2011-10-16 MED ORDER — ONDANSETRON HCL 4 MG/2ML IJ SOLN
4.0000 mg | INTRAMUSCULAR | Status: DC | PRN
  Administered 2011-10-16: 4 mg via INTRAVENOUS
  Filled 2011-10-16 (×2): qty 2

## 2011-10-16 MED ORDER — PROMETHAZINE HCL 25 MG/ML IJ SOLN
25.0000 mg | INTRAMUSCULAR | Status: AC
  Administered 2011-10-16: 25 mg via INTRAVENOUS
  Filled 2011-10-16: qty 1

## 2011-10-16 MED ORDER — MORPHINE SULFATE 4 MG/ML IJ SOLN
4.0000 mg | Freq: Once | INTRAMUSCULAR | Status: AC
  Administered 2011-10-16: 4 mg via INTRAVENOUS
  Filled 2011-10-16: qty 1

## 2011-10-16 MED ORDER — OXYCODONE-ACETAMINOPHEN 5-325 MG PO TABS: 1.0000 | ORAL_TABLET | Freq: Four times a day (QID) | ORAL | Status: AC | PRN

## 2011-10-16 MED ORDER — DIPHENHYDRAMINE HCL 50 MG/ML IJ SOLN
50.0000 mg | Freq: Once | INTRAMUSCULAR | Status: DC
  Filled 2011-10-16: qty 1

## 2011-10-16 MED ORDER — SODIUM CHLORIDE 0.9 % IV BOLUS (SEPSIS)
500.0000 mL | Freq: Once | INTRAVENOUS | Status: AC
  Administered 2011-10-16: 500 mL via INTRAVENOUS

## 2011-10-16 MED ORDER — IOHEXOL 300 MG/ML  SOLN
80.0000 mL | Freq: Once | INTRAMUSCULAR | Status: AC | PRN
  Administered 2011-10-16: 80 mL via INTRAVENOUS

## 2011-10-16 MED ORDER — IOHEXOL 300 MG/ML  SOLN: 20.0000 mL | INTRAMUSCULAR | Status: AC

## 2011-10-16 MED ORDER — MORPHINE SULFATE 4 MG/ML IJ SOLN
6.0000 mg | Freq: Once | INTRAMUSCULAR | Status: AC
  Administered 2011-10-16: 6 mg via INTRAVENOUS
  Filled 2011-10-16: qty 2

## 2011-10-16 MED ORDER — MORPHINE SULFATE 4 MG/ML IJ SOLN
6.0000 mg | Freq: Once | INTRAMUSCULAR | Status: AC
  Administered 2011-10-16: 6 mg via INTRAVENOUS
  Filled 2011-10-16 (×2): qty 1

## 2011-10-16 NOTE — ED Notes (Signed)
Patient still in radiology.

## 2011-10-16 NOTE — ED Notes (Addendum)
Pt placed on cardiac monitor, bp cuff, and pulse ox.

## 2011-10-16 NOTE — ED Notes (Signed)
Pt assisted to bedside commode on arrival.

## 2011-10-16 NOTE — ED Provider Notes (Cosign Needed)
History     CSN: [id number]  Arrival date & time 10/16/11  1616   First MD Initiated Contact with Patient 10/16/11 1639      Chief Complaint  Patient presents with  . Chest Pain  . Shortness of Breath    (Consider location/radiation/quality/duration/timing/severity/associated sxs/prior treatment) HPI Comments: Patient presents emergency Department with a chief complaint of chest pain and shortness of breath with a history significant for COPD (emphysema) .  Patient states that the pain started around 2 o'clock this afternoon and that it is pleuritic and squeezing in nature.  Patient rates her pain as a 10 out of 10.  Location is midsternal that moves to her right chest.  In addition patient reports she had jaw pain, chills, nausea, left sided arm pain.  Arm pain x 3 weeks.  Patient is unaware or of any exacerbating or alleviating factors.  Patient arrived to emergency department via EMS where she was given one sublingual nitroglycerin that provided temporary relief.  Denies recent surgery, hx of blood clots, recent bed rest or travel, unilateral leg swelling or claudication, and hemoptysis. Pt also denies DOE, PND,  & orthopnea. Denies V/D, reports epigastric abdominal pain. Is unaware if worse with food. Denies knowledge of abd surgery in past.     Patient is a 51 y.o. female presenting with chest pain and shortness of breath. The history is provided by the patient.  Chest Pain Primary symptoms include shortness of breath.    Shortness of Breath  Associated symptoms include chest pain and shortness of breath.    Past Medical History  Diagnosis Date  . COPD (chronic obstructive pulmonary disease)   . Carpal tunnel syndrome   . Diverticulitis     No past surgical history on file.  No family history on file.  History  Substance Use Topics  . Smoking status: Not on file  . Smokeless tobacco: Not on file  . Alcohol Use:     OB History    Grav Para Term Preterm Abortions  TAB SAB Ect Mult Living                  Review of Systems  Respiratory: Positive for shortness of breath.   Cardiovascular: Positive for chest pain.    Allergies  Aspirin; Doxycycline; and Hydrocodone-acetaminophen  Home Medications   Current Outpatient Rx  Name Route Sig Dispense Refill  . ASPIRIN 81 MG PO CHEW Oral Chew 81 mg by mouth once.      BP 116/65  Pulse 56  Temp(Src) 98.5 F (36.9 C) (Oral)  Resp 17  SpO2 100%  Physical Exam  Nursing note and vitals reviewed. Constitutional: She is oriented to person, place, and time. She appears well-developed and well-nourished. No distress.  HENT:  Head: Normocephalic and atraumatic.  Eyes:       Normal appearance  Neck: Normal range of motion.  Cardiovascular: Normal rate, regular rhythm, normal heart sounds and intact distal pulses.   Pulmonary/Chest: Effort normal and breath sounds normal. No respiratory distress. She exhibits no tenderness.  Abdominal: Soft. Bowel sounds are normal. She exhibits no distension. There is tenderness in the right upper quadrant, epigastric area and periumbilical area. There is no guarding.       Tenderness to palpation severe  Musculoskeletal: She exhibits no edema and no tenderness.       2+ Dorsalis Pedis pulses  Neurological: She is alert and oriented to person, place, and time.  Skin: Skin is warm and dry.  No rash noted. She is not diaphoretic.  Psychiatric: She has a normal mood and affect. Her behavior is normal.    ED Course  Procedures (including critical care time)  Labs Reviewed  CBC - Abnormal; Notable for the following:    Platelets 440 (*)    All other components within normal limits  LIPASE, BLOOD - Abnormal; Notable for the following:    Lipase 93 (*)    All other components within normal limits  DIFFERENTIAL  COMPREHENSIVE METABOLIC PANEL  POCT I-STAT TROPONIN I  I-STAT TROPONIN I   Dg Chest 2 View  10/16/2011  *RADIOLOGY REPORT*  Clinical Data: Severe  right-sided chest pain today  CHEST - 2 VIEW  Comparison: Chest x-ray of 12/20/2010  Findings: No active infiltrate or effusion is seen.  There is some peribronchial thickening which may indicate bronchitis. Mediastinal contours are stable.  The heart is within normal limits in size.  An old mid right clavicular fracture is noted.  No acute bony abnormality is seen.  IMPRESSION: No active lung disease.  Peribronchial thickening may indicate bronchitis.  Old right mid clavicular fracture.  Original Report Authenticated By: Joretta Bachelor, M.D.   US Abdomen Complete  10/16/2011  *RADIOLOGY REPORT*  Clinical Data:  Right upper quadrant pain.  COMPLETE ABDOMINAL ULTRASOUND  Comparison:  03/27/2007 CT abdomen and pelvis.  Findings:  Gallbladder:  No gallstones, gallbladder wall thickening, or pericholecystic fluid.  Common bile duct:  4.9 mm.  Liver:  No focal lesion identified.  Within normal limits in parenchymal echogenicity.  IVC:  Unremarkable.  Pancreas:  Small portions not visualized secondary to bowel gas. Portions visualized appear unremarkable.  Spleen:  6.6 cm.  No focal mass.  Right Kidney:  10.2 cm. No hydronephrosis or renal mass.  Left Kidney:  10.8 cm. No hydronephrosis or renal mass.  Abdominal aorta:  Atherosclerotic type changes.  Maximal AP dimension 2.1 cm.  IMPRESSION: No gallstones.  Atherosclerotic type changes of the aorta with maximal AP dimension 2.1 cm.  Original Report Authenticated By: Doug Sou, M.D.     No diagnosis found. Due to patient's story and physical exam abdominal ultrasound has been ordered for further evaluation of epigastric and right upper quadrant abdominal pain.  Because of patient's lack of cardiac history, it negative troponin, normal EKG it is not likely the cardiac is the cause of patient's pain.  Patient's pain is currently being managed and emergency department with morphine and nausea with Zofran.  A CT abdomen and pelvis was ordered for patient's  elevated lipase to rule out possible pancreatitis.  Patient did not have a history of alcohol use, abuse.  Patient was unable to tolerate the oral contrast and Phenergan was given.   CT IMPRESSION:  1. No acute abnormalities seen within the abdomen or pelvis; no evidence for pancreatitis. 2. Diffuse calcification along the abdominal aorta and its branches, including at the origins of both renal arteries, advanced for age. 3. 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 4. Question of tiny hepatic cyst. 5. Dependent bibasilar atelectasis noted at the lung bases.  Original Report Authenticated By: Santa Lighter, M.D.  Patient had no acute findings on CT exam to explain abdominal pain.  Patient will be discharged with instructions to followup with gastroenterology for further evaluation of this pain.  This patient is also some been seen by Dr. Monia Pouch who agrees with my plan to dc pt with a PPI  and close follow up.    MDM  Abdominal Pain (RUQ, epigastric, periumbilical)     Medical screening examination/treatment/procedure(s) were conducted as a shared visit with non-physician practitioner(s) and myself.  I personally evaluated the patient during the encounter 51 yo woman with epigastric pain, and epigastric and RUQ tenderness on exam.  Lab workup shows mildly elevated lipase, negative abdominal ultrasound and abdominal and pelvic CT.  Rx with PPI, GI followup. Katy Apo, M.D.     Verl Dicker, [place] 10/21/11 [place], MD 10/24/11 [phone]

## 2011-10-16 NOTE — ED Notes (Signed)
Patient drinking po contrast again now after Phenergan.

## 2011-10-16 NOTE — ED Provider Notes (Signed)
4:40 PM  Date: 10/16/2011  Rate: 66  Rhythm: normal sinus rhythm  QRS Axis: normal  Intervals: normal  ST/T Wave abnormalities: normal  Conduction Disutrbances:none  Narrative Interpretation: Normal EKG  Old EKG Reviewed: unchanged    Mylinda Latina III, MD 10/16/11 [phone]

## 2011-10-16 NOTE — ED Notes (Signed)
Pt presents to department via [facility] for evaluation of midsternal/right sided chest pain. Onset this afternoon. Pain increases with deep breathing and movement. 10/10 upon arrival to ED. Received 1 sublingual nitroglycerin with some relief. 20g LAC. Pt conscious alert and oriented x4.

## 2011-10-16 NOTE — ED Notes (Signed)
Patient presents with sudden onset substernal chest pain since 1330 this afternoon with worsening pain upon deep inspiration.  Patient alert and orientedx3, skin warm, dry and intact, and reporting pain 7/10 which improved after EMS gave 1 SL nitro.  Patient had 81mg  of aspirin PTA.

## 2012-04-15 ENCOUNTER — Emergency Department (HOSPITAL_COMMUNITY)
Admission: EM | Admit: 2012-04-15 | Discharge: 2012-04-15 | Disposition: A | Discharge status: Home / Self Care | Payer: Medicaid Other | Attending: Emergency Medicine | Admitting: Emergency Medicine

## 2012-04-15 ENCOUNTER — Encounter (HOSPITAL_COMMUNITY): Payer: Self-pay

## 2012-04-15 ENCOUNTER — Emergency Department (HOSPITAL_COMMUNITY): Payer: Medicaid Other

## 2012-04-15 DIAGNOSIS — J449 Chronic obstructive pulmonary disease, unspecified: Secondary | ICD-10-CM

## 2012-04-15 DIAGNOSIS — B9789 Other viral agents as the cause of diseases classified elsewhere: Secondary | ICD-10-CM | POA: Insufficient documentation

## 2012-04-15 DIAGNOSIS — F17200 Nicotine dependence, unspecified, uncomplicated: Secondary | ICD-10-CM | POA: Insufficient documentation

## 2012-04-15 DIAGNOSIS — J4489 Other specified chronic obstructive pulmonary disease: Secondary | ICD-10-CM | POA: Insufficient documentation

## 2012-04-15 DIAGNOSIS — Z7982 Long term (current) use of aspirin: Secondary | ICD-10-CM | POA: Insufficient documentation

## 2012-04-15 DIAGNOSIS — R599 Enlarged lymph nodes, unspecified: Secondary | ICD-10-CM | POA: Insufficient documentation

## 2012-04-15 DIAGNOSIS — B349 Viral infection, unspecified: Secondary | ICD-10-CM

## 2012-04-15 LAB — URINALYSIS, ROUTINE W REFLEX MICROSCOPIC
Ketones, ur: NEGATIVE mg/dL
Leukocytes, UA: NEGATIVE
Protein, ur: NEGATIVE mg/dL
Urobilinogen, UA: 0.2 mg/dL (ref 0.0–1.0)

## 2012-04-15 LAB — CBC WITH DIFFERENTIAL/PLATELET
Eosinophils Absolute: 0.2 10*3/uL (ref 0.0–0.7)
Hemoglobin: 15.6 g/dL — ABNORMAL HIGH (ref 12.0–15.0)
Lymphocytes Relative: 32 % (ref 12–46)
Lymphs Abs: 2.7 10*3/uL (ref 0.7–4.0)
MCH: 32.4 pg (ref 26.0–34.0)
Monocytes Relative: 7 % (ref 3–12)
Neutrophils Relative %: 59 % (ref 43–77)
Platelets: 388 10*3/uL (ref 150–400)
RBC: 4.81 MIL/uL (ref 3.87–5.11)
WBC: 8.5 10*3/uL (ref 4.0–10.5)

## 2012-04-15 LAB — COMPREHENSIVE METABOLIC PANEL
ALT: 15 U/L (ref 0–35)
AST: 21 U/L (ref 0–37)
Calcium: 10.5 mg/dL (ref 8.4–10.5)
Creatinine, Ser: 0.59 mg/dL (ref 0.50–1.10)
GFR calc Af Amer: >90 mL/min (ref >90)
Glucose, Bld: 94 mg/dL (ref 70–99)
Sodium: 139 mEq/L (ref 135–145)
Total Protein: 8.8 g/dL — ABNORMAL HIGH (ref 6.0–8.3)

## 2012-04-15 MED ORDER — OXYCODONE-ACETAMINOPHEN 5-325 MG PO TABS
1.0000 | ORAL_TABLET | Freq: Once | ORAL | Status: AC
  Administered 2012-04-15: 1 via ORAL
  Filled 2012-04-15: qty 1

## 2012-04-15 MED ORDER — AEROCHAMBER PLUS W/MASK MISC
Status: AC
  Administered 2012-04-15: 1
  Filled 2012-04-15: qty 1

## 2012-04-15 MED ORDER — ALBUTEROL SULFATE HFA 108 (90 BASE) MCG/ACT IN AERS
4.0000 | INHALATION_SPRAY | RESPIRATORY_TRACT | Status: DC | PRN
  Administered 2012-04-15: 4 via RESPIRATORY_TRACT
  Filled 2012-04-15: qty 6.7

## 2012-04-15 MED ORDER — ONDANSETRON 4 MG PO TBDP
4.0000 mg | ORAL_TABLET | Freq: Once | ORAL | Status: AC
  Administered 2012-04-15: 4 mg via ORAL
  Filled 2012-04-15: qty 1

## 2012-04-15 NOTE — ED Notes (Signed)
sts feels like her pancreas hurts, and all of her lymphnodes are sore all over, and low grade fever.

## 2012-04-15 NOTE — ED Provider Notes (Signed)
History     CSN: [id number]  Arrival date & time 04/15/12  49   First MD Initiated Contact with Patient 04/15/12 1609      Chief Complaint  Patient presents with  . Lymphadenopathy    (Consider location/radiation/quality/duration/timing/severity/associated sxs/prior treatment) Patient is a 51 y.o. female presenting with general illness and abdominal pain. The history is provided by the patient and medical records.  Illness  The current episode started 3 to 5 days ago. The onset was gradual. The problem occurs continuously. The problem has been gradually improving. The problem is severe. The symptoms are relieved by one or more OTC medications. Associated symptoms include a fever (subjective), abdominal pain, congestion, rhinorrhea, cough, URI and wheezing. Pertinent negatives include no constipation, no diarrhea, no nausea, no vomiting, no headaches, no muscle aches and no rash. She has been eating less than usual. There were no sick contacts. She has received no recent medical care.  Abdominal Pain The primary symptoms of the illness include abdominal pain and fever (subjective). The primary symptoms of the illness do not include shortness of breath, nausea, vomiting, diarrhea or dysuria. The current episode started more than 2 days ago. Onset quality: waxing and waning. The problem has not changed since onset. The abdominal pain is located in the epigastric region. The abdominal pain does not radiate.  The patient states that she believes she is currently not pregnant. The patient has had a change in bowel habit (single white stool several days ago). Symptoms associated with the illness do not include chills, constipation, urgency, hematuria, frequency or back pain.    Past Medical History  Diagnosis Date  . COPD (chronic obstructive pulmonary disease)   . Carpal tunnel syndrome   . Diverticulitis     No past surgical history on file.  No family history on file.  History    Substance Use Topics  . Smoking status: Current Everyday Smoker  . Smokeless tobacco: Not on file  . Alcohol Use: No    OB History    Grav Para Term Preterm Abortions TAB SAB Ect Mult Living                  Review of Systems  Constitutional: Positive for fever (subjective). Negative for chills.  HENT: Positive for congestion and rhinorrhea.   Respiratory: Positive for cough and wheezing. Negative for shortness of breath.   Cardiovascular: Negative for chest pain and palpitations.  Gastrointestinal: Positive for abdominal pain. Negative for nausea, vomiting, diarrhea, constipation and abdominal distention.  Genitourinary: Negative for dysuria, urgency, frequency and hematuria.  Musculoskeletal: Negative for back pain.  Skin: Negative for color change and rash.  Neurological: Negative for light-headedness and headaches.  Hematological: Positive for adenopathy.  All other systems reviewed and are negative.    Allergies  Doxycycline; Aspirin; Hydrocodone-acetaminophen; and Iohexol  Home Medications   Current Outpatient Rx  Name Route Sig Dispense Refill  . ASPIRIN-ACETAMINOPHEN-CAFFEINE 250-250-65 MG PO TABS Oral Take 2 tablets by mouth every 6 (six) hours as needed. For pain    . OVER THE COUNTER MEDICATION Both Eyes Place 1 drop into both eyes daily as needed. Re-wetting drop for dry eyes      BP 150/93  Pulse 66  Temp 98.3 F (36.8 C) (Oral)  Resp 14  SpO2 97%  Physical Exam  Nursing note and vitals reviewed. Constitutional: She is oriented to person, place, and time. She appears well-developed and well-nourished.  HENT:  Head: Normocephalic and atraumatic.  Eyes: Pupils  are equal, round, and reactive to light.  Neck: Full passive range of motion without pain. No Brudzinski's sign and no Kernig's sign noted.  Cardiovascular: Normal rate, regular rhythm, normal heart sounds and intact distal pulses.   Pulmonary/Chest: Effort normal. No respiratory distress. She  has wheezes (mild, diffuse).  Abdominal: Soft. Bowel sounds are normal. She exhibits no distension. There is tenderness (very mild, epigastric). There is no rebound and no guarding.  Lymphadenopathy:    She has cervical adenopathy (tender, anterior).  Neurological: She is alert and oriented to person, place, and time.  Skin: Skin is warm and dry.  Psychiatric: She has a normal mood and affect.    ED Course  Procedures (including critical care time)  Labs Reviewed  COMPREHENSIVE METABOLIC PANEL - Abnormal; Notable for the following:    Total Protein 8.8 (*)     All other components within normal limits  LIPASE, BLOOD - Abnormal; Notable for the following:    Lipase 134 (*)     All other components within normal limits  CBC WITH DIFFERENTIAL - Abnormal; Notable for the following:    Hemoglobin 15.6 (*)     All other components within normal limits  URINALYSIS, ROUTINE W REFLEX MICROSCOPIC   Dg Chest 2 View  04/15/2012  *RADIOLOGY REPORT*  Clinical Data: Cough, fever, shortness of breath  CHEST - 2 VIEW  Comparison: 10/16/2011  Findings: Mild hyperinflation without focal pneumonia, collapse, consolidation, effusion, or pneumothorax.  Trachea midline.  Normal heart size and vascularity.  Remote right mid clavicle fracture with healed deformity.  IMPRESSION: Mild hyperinflation without acute chest process  Original Report Authenticated By: Jerilynn Mages. Daryll Brod, M.D.     1. Viral syndrome   2. COPD (chronic obstructive pulmonary disease)       MDM  This is a 51 year old female who presents here today with 2 separate complaints. She states that starting on Saturday the 13th she had diffuse painful lymphadenopathy as well subjective low-grade fevers. She states that since Saturday the symptoms have been gradually improving, but she does still have persistent anterior lymphadenopathy that has not completely resolved. She has had some cough and congestion, as well as some chest tightness and  shortness of breath consistent with prior emphysema exacerbation, however since she does not currently have an inhaler at home to use. She denies any other focal infectious symptoms. Her second complaint is epigastric discomfort intermittently since January when she was seen here and found to have an elevated lipase, when a right upper quadrant ultrasound and a CT scan did not show any acute findings. The patient was instructed to followup with GI about her pain, but was unable to do so due to lack of insurance. She's not having worse pain than normal, increased frequency of her pain, significant vomiting, fevers, or weight loss; she does note a single white stool several days ago, but has not had any other changes in her stools, and they have otherwise been normal in color. She has anterior cervical lymphadenopathy, mild diffuse wheezing, very minimal abdominal tenderness and exam is otherwise unremarkable. Labs here are remarkable for lipase of 134 which is only mildly elevated from her previous value last time she was seen. With chronic intermittent pain since her last visit, and mild discomfort today do not feel there is any benefit to repeating a CT scan, given her negative scan her last visit. Chest x-ray was obtained given her URI type symptoms and shortness of breath, which does not show any  acute findings. The patient was given several breathing treatments with albuterol and does endorse significant improvement of her shortness of breath and her chest tightness. Discussed with the patient continued symptomatic treatment of her viral illness, followup with a regular Dr. for chronic management of her emphysema, as well as for further evaluation of her abdominal pain and mild elevation of lipase. Discussed indications for return, the patient expressed understanding.       Marcelino Scot, MD 04/15/12 [phone]

## 2012-04-18 NOTE — ED Provider Notes (Signed)
I saw and evaluated the patient, reviewed the resident's note and I agree with the findings and plan. Pt c/o recent low grade fever and l/a. No fever currently. No chills/sweats. abd soft nt.   Mirna Mires, MD 04/18/12 [phone]

## 2012-06-07 ENCOUNTER — Emergency Department: Payer: Self-pay | Admitting: Emergency Medicine

## 2012-06-07 LAB — URINALYSIS, COMPLETE
Bilirubin,UR: NEGATIVE
Leukocyte Esterase: NEGATIVE
Nitrite: NEGATIVE
Ph: 5 (ref 4.5–8.0)
Protein: NEGATIVE
RBC,UR: 1 /HPF (ref 0–5)
Squamous Epithelial: 2
WBC UR: 1 /HPF (ref 0–5)

## 2012-06-07 LAB — LIPASE, BLOOD: Lipase: 509 U/L — ABNORMAL HIGH (ref 73–393)

## 2012-06-07 LAB — COMPREHENSIVE METABOLIC PANEL
Albumin: 4.3 g/dL (ref 3.4–5.0)
Alkaline Phosphatase: 123 U/L (ref 50–136)
Anion Gap: 6 — ABNORMAL LOW (ref 7–16)
BUN: 11 mg/dL (ref 7–18)
Bilirubin,Total: 0.7 mg/dL (ref 0.2–1.0)
Creatinine: 0.83 mg/dL (ref 0.60–1.30)
Glucose: 110 mg/dL — ABNORMAL HIGH (ref 65–99)
SGOT(AST): 29 U/L (ref 15–37)
SGPT (ALT): 20 U/L (ref 12–78)
Total Protein: 8.2 g/dL (ref 6.4–8.2)

## 2012-06-07 LAB — CBC
MCHC: 34.5 g/dL (ref 32.0–36.0)
Platelet: 362 10*3/uL (ref 150–440)
RDW: 13.8 % (ref 11.5–14.5)

## 2012-06-18 DIAGNOSIS — K5720 Diverticulitis of large intestine with perforation and abscess without bleeding: Secondary | ICD-10-CM | POA: Insufficient documentation

## 2012-07-21 ENCOUNTER — Encounter: Payer: Self-pay | Admitting: Gastroenterology

## 2012-07-21 ENCOUNTER — Other Ambulatory Visit: Payer: Self-pay | Admitting: Family Medicine

## 2012-07-21 DIAGNOSIS — Z1239 Encounter for other screening for malignant neoplasm of breast: Secondary | ICD-10-CM | POA: Insufficient documentation

## 2012-07-21 DIAGNOSIS — Z1231 Encounter for screening mammogram for malignant neoplasm of breast: Secondary | ICD-10-CM

## 2012-08-22 ENCOUNTER — Ambulatory Visit
Admission: RE | Admit: 2012-08-22 | Discharge: 2012-08-22 | Disposition: A | Discharge status: Home / Self Care | Payer: Medicaid Other | Source: Ambulatory Visit | Attending: Family Medicine | Admitting: Family Medicine

## 2012-08-22 DIAGNOSIS — Z1231 Encounter for screening mammogram for malignant neoplasm of breast: Secondary | ICD-10-CM

## 2012-08-26 ENCOUNTER — Ambulatory Visit (INDEPENDENT_AMBULATORY_CARE_PROVIDER_SITE_OTHER): Payer: Medicaid Other | Admitting: Gastroenterology

## 2012-08-26 ENCOUNTER — Encounter: Payer: Self-pay | Admitting: Gastroenterology

## 2012-08-26 VITALS — BP 148/90 | HR 90 | Ht 60.25 in | Wt 112.2 lb

## 2012-08-26 DIAGNOSIS — R109 Unspecified abdominal pain: Secondary | ICD-10-CM

## 2012-08-26 DIAGNOSIS — Z9049 Acquired absence of other specified parts of digestive tract: Secondary | ICD-10-CM

## 2012-08-26 DIAGNOSIS — K660 Peritoneal adhesions (postprocedural) (postinfection): Secondary | ICD-10-CM

## 2012-08-26 DIAGNOSIS — R110 Nausea: Secondary | ICD-10-CM

## 2012-08-26 DIAGNOSIS — F17200 Nicotine dependence, unspecified, uncomplicated: Secondary | ICD-10-CM

## 2012-08-26 DIAGNOSIS — Z9889 Other specified postprocedural states: Secondary | ICD-10-CM

## 2012-08-26 NOTE — Progress Notes (Signed)
History of Present Illness:  This is a very complex and involved 51 year old Caucasian female schizophrenic, heavy smoker with COPD who has chronic left mid abdominal pain for greater than 8 years.  She had perforated diverticulitis in 2008 that required partial colectomy by Dr. Hulen Skains.  She's had abdominal pain ever since that illness..  She underwent endoscopy and colonoscopy on several occasions, last performed in 2009,both which were unremarkable. She continues with steady abdominal pain unrelated to bowel movements, eating, or any other alleviating or precipitating factors.  She is in and out of the emergency room with pain, and had a CT scan in January of 2012 that was reviewed and was unremarkable.  She was told at that time that she had "pancreatitis".  Review of her record shows no previous gallbladder ultrasound exam.   The patient is crying throughout our exam and cannot understand why she has  to go through the disability process every year.  Apparently she is disabled and on Medicaid.  Recent labs from primary care were reviewed  And are entirely unremarkable.  There is no evidence of anemia or liver function test abnormalities.  She denies alcohol abuse.  I have reviewed this patient's present history, medical and surgical past history, allergies and medications.     ROS: The remainder of the 10 point ROS is ++... she is checked every possible review of system and plan all our exams.  As always he makes evaluated in her very difficulty.  Also complains of chronic anxiety and depression, arthralgias, myalgias, schizophrenia, Z. chronic gynecologic problems.  She has shortness of breath and cannot walk very far without dyspnea on exertion.    Physical Exam: Remarkably healthy-appearing patient although she is a chronic smoker.  Her vital signs today are stable blood pressure 140/90, pulse 90 and regular, weight   112 pounds the BMI of 21.74. General well developed well nourished patient in  no acute distress, appearing their stated age Eyes PERRLA, no icterus, fundoscopic exam per opthamologist Skin no lesions noted Neck supple, no adenopathy, no thyroid enlargement, no tenderness Chest clear to percussion and auscultation Heart no significant murmurs, gallops or rubs noted Abdomen no hepatosplenomegaly masses or tenderness, BS normal.  Rectal inspection normal no fissures, or fistulae noted.  No masses or tenderness on digital exam. Stool guaiac negative.  Screening during rectal exam because of pain. Extremities no acute joint lesions, edema, phlebitis or evidence of cellulitis. Neurologic patient oriented x 3, cranial nerves intact, no focal neurologic deficits noted. Psychological mental status normal and normal affect.  Assessment and plan: This patient does not need repeat endoscopy and colonoscopy.  She has chronic, chronic abdominal pain largely functional in nature perhaps related in some degree to intestinal adhesions from her previous diverticulitis and surgery.  There is no evidence of bowel obstruction, pancreatitis, malignancy, or other reasons for her chronic left mid abdominal pain.  She's had CT scan and serial labs within the last year that are normal.  Also I do not think she needs further labs or any further CT scans. I have referred her to a local PAIN CLINIC management of her chronic abdominal pain syndrome. To complete her workup I have scheduled upper abdominal ultrasound exam to exclude cholelithiasis as this has not been completed.    CC DR.James Wyatt at [place] and primary care,,,  Encounter Diagnoses  Name Primary?  . Abdominal pain Yes  . Nausea

## 2012-08-26 NOTE — Patient Instructions (Addendum)
You have been given a separate informational sheet regarding your tobacco use, the importance of quitting and local resources to help you quit.  You have been scheduled for an abdominal ultrasound at [facility] (1st floor of hospital) on 08-27-2012 at 8 AM. Please arrive 15 minutes prior to your appointment for registration. Make certain not to have anything to eat or drink 6 hours prior to your appointment. Should you need to reschedule your appointment, please contact radiology at [phone]. This test typically takes about 30 minutes to perform.  We will call you back with your information regarding your pain clinic appointment.

## 2012-08-27 ENCOUNTER — Ambulatory Visit (HOSPITAL_COMMUNITY): Payer: Medicaid Other

## 2012-08-27 ENCOUNTER — Ambulatory Visit (HOSPITAL_COMMUNITY)
Admission: RE | Admit: 2012-08-27 | Discharge: 2012-08-27 | Disposition: A | Discharge status: Home / Self Care | Payer: Medicaid Other | Source: Ambulatory Visit | Attending: Gastroenterology | Admitting: Gastroenterology

## 2012-08-27 DIAGNOSIS — R109 Unspecified abdominal pain: Secondary | ICD-10-CM | POA: Insufficient documentation

## 2012-08-27 DIAGNOSIS — R110 Nausea: Secondary | ICD-10-CM | POA: Insufficient documentation

## 2012-09-03 ENCOUNTER — Telehealth: Payer: Self-pay | Admitting: *Deleted

## 2012-09-03 NOTE — Telephone Encounter (Signed)
Called pain management because I faxed referral on 08-26-2012, have fax confirmation..  Pain management said they did not receive fax, wants me to fax again to [phone] attention Dorothy.  Faxed again. Waiting on response

## 2012-09-08 ENCOUNTER — Telehealth: Payer: Self-pay | Admitting: *Deleted

## 2012-09-08 NOTE — Telephone Encounter (Signed)
Called Pain Management  at [phone] and spoke with Abigail Butts. Abigail Butts transferred me to Earlie Server who is the referral coordinator.  Per Earlie Server never received fax and I have fax confirmation that fax was sent twice.  Will fax again and advised Earlie Server to call me if she does not receive.   Dorothy verbalized understanding.

## 2012-10-07 ENCOUNTER — Observation Stay (HOSPITAL_COMMUNITY)
Admission: EM | Admit: 2012-10-07 | Discharge: 2012-10-08 | Disposition: A | Discharge status: Home / Self Care | Payer: Medicaid Other | Attending: Internal Medicine | Admitting: Internal Medicine

## 2012-10-07 ENCOUNTER — Emergency Department (HOSPITAL_COMMUNITY): Payer: Medicaid Other

## 2012-10-07 ENCOUNTER — Encounter (HOSPITAL_COMMUNITY): Payer: Self-pay

## 2012-10-07 DIAGNOSIS — J45909 Unspecified asthma, uncomplicated: Secondary | ICD-10-CM | POA: Diagnosis present

## 2012-10-07 DIAGNOSIS — K565 Intestinal adhesions [bands], unspecified as to partial versus complete obstruction: Secondary | ICD-10-CM

## 2012-10-07 DIAGNOSIS — K222 Esophageal obstruction: Secondary | ICD-10-CM

## 2012-10-07 DIAGNOSIS — Z8679 Personal history of other diseases of the circulatory system: Secondary | ICD-10-CM

## 2012-10-07 DIAGNOSIS — M25519 Pain in unspecified shoulder: Secondary | ICD-10-CM | POA: Insufficient documentation

## 2012-10-07 DIAGNOSIS — R079 Chest pain, unspecified: Secondary | ICD-10-CM

## 2012-10-07 DIAGNOSIS — F17200 Nicotine dependence, unspecified, uncomplicated: Secondary | ICD-10-CM | POA: Insufficient documentation

## 2012-10-07 DIAGNOSIS — R195 Other fecal abnormalities: Secondary | ICD-10-CM

## 2012-10-07 DIAGNOSIS — F319 Bipolar disorder, unspecified: Secondary | ICD-10-CM | POA: Insufficient documentation

## 2012-10-07 DIAGNOSIS — R51 Headache: Secondary | ICD-10-CM

## 2012-10-07 DIAGNOSIS — R0602 Shortness of breath: Secondary | ICD-10-CM | POA: Insufficient documentation

## 2012-10-07 DIAGNOSIS — F209 Schizophrenia, unspecified: Secondary | ICD-10-CM | POA: Insufficient documentation

## 2012-10-07 DIAGNOSIS — R1084 Generalized abdominal pain: Secondary | ICD-10-CM

## 2012-10-07 DIAGNOSIS — K589 Irritable bowel syndrome without diarrhea: Secondary | ICD-10-CM | POA: Diagnosis present

## 2012-10-07 DIAGNOSIS — Z860100 Personal history of colon polyps, unspecified: Secondary | ICD-10-CM

## 2012-10-07 DIAGNOSIS — F411 Generalized anxiety disorder: Secondary | ICD-10-CM | POA: Diagnosis present

## 2012-10-07 DIAGNOSIS — F3289 Other specified depressive episodes: Secondary | ICD-10-CM

## 2012-10-07 DIAGNOSIS — M129 Arthropathy, unspecified: Secondary | ICD-10-CM

## 2012-10-07 DIAGNOSIS — F329 Major depressive disorder, single episode, unspecified: Secondary | ICD-10-CM

## 2012-10-07 DIAGNOSIS — I10 Essential (primary) hypertension: Secondary | ICD-10-CM | POA: Diagnosis present

## 2012-10-07 DIAGNOSIS — K449 Diaphragmatic hernia without obstruction or gangrene: Secondary | ICD-10-CM

## 2012-10-07 DIAGNOSIS — Z23 Encounter for immunization: Secondary | ICD-10-CM | POA: Insufficient documentation

## 2012-10-07 DIAGNOSIS — K5730 Diverticulosis of large intestine without perforation or abscess without bleeding: Secondary | ICD-10-CM

## 2012-10-07 DIAGNOSIS — Z862 Personal history of diseases of the blood and blood-forming organs and certain disorders involving the immune mechanism: Secondary | ICD-10-CM

## 2012-10-07 DIAGNOSIS — Z85828 Personal history of other malignant neoplasm of skin: Secondary | ICD-10-CM

## 2012-10-07 DIAGNOSIS — R0789 Other chest pain: Principal | ICD-10-CM | POA: Diagnosis present

## 2012-10-07 DIAGNOSIS — K219 Gastro-esophageal reflux disease without esophagitis: Secondary | ICD-10-CM | POA: Diagnosis present

## 2012-10-07 HISTORY — DX: Shortness of breath: R06.02

## 2012-10-07 LAB — COMPREHENSIVE METABOLIC PANEL
ALT: 11 U/L (ref 0–35)
AST: 18 U/L (ref 0–37)
Albumin: 4.2 g/dL (ref 3.5–5.2)
Alkaline Phosphatase: 91 U/L (ref 39–117)
CO2: 23 mEq/L (ref 19–32)
Chloride: 105 mEq/L (ref 96–112)
Creatinine, Ser: 0.8 mg/dL (ref 0.50–1.10)
GFR calc non Af Amer: 85 mL/min — ABNORMAL LOW (ref >90)
Potassium: 4.3 mEq/L (ref 3.5–5.1)
Total Bilirubin: 0.3 mg/dL (ref 0.3–1.2)

## 2012-10-07 LAB — CBC WITH DIFFERENTIAL/PLATELET
Basophils Absolute: 0.1 10*3/uL (ref 0.0–0.1)
HCT: 39 % (ref 36.0–46.0)
Hemoglobin: 13.4 g/dL (ref 12.0–15.0)
Lymphocytes Relative: 38 % (ref 12–46)
Monocytes Absolute: 0.4 10*3/uL (ref 0.1–1.0)
Neutro Abs: 3.9 10*3/uL (ref 1.7–7.7)
Neutrophils Relative %: 52 % (ref 43–77)
RDW: 12.8 % (ref 11.5–15.5)
WBC: 7.4 10*3/uL (ref 4.0–10.5)

## 2012-10-07 LAB — CBC
MCH: 31.8 pg (ref 26.0–34.0)
MCHC: 33.8 g/dL (ref 30.0–36.0)
Platelets: 352 10*3/uL (ref 150–400)
RBC: 4.18 MIL/uL (ref 3.87–5.11)

## 2012-10-07 LAB — CREATININE, SERUM
Creatinine, Ser: 0.91 mg/dL (ref 0.50–1.10)
GFR calc non Af Amer: 73 mL/min — ABNORMAL LOW (ref >90)

## 2012-10-07 LAB — URINALYSIS, ROUTINE W REFLEX MICROSCOPIC
Bilirubin Urine: NEGATIVE
Glucose, UA: NEGATIVE mg/dL
Hgb urine dipstick: NEGATIVE
Ketones, ur: NEGATIVE mg/dL
Protein, ur: NEGATIVE mg/dL
Urobilinogen, UA: 0.2 mg/dL (ref 0.0–1.0)

## 2012-10-07 MED ORDER — GUAIFENESIN ER 600 MG PO TB12
600.0000 mg | ORAL_TABLET | Freq: Two times a day (BID) | ORAL | Status: DC
  Filled 2012-10-07 (×3): qty 1

## 2012-10-07 MED ORDER — ACETAMINOPHEN 325 MG PO TABS: 650.0000 mg | ORAL_TABLET | Freq: Four times a day (QID) | ORAL | Status: DC | PRN

## 2012-10-07 MED ORDER — PANTOPRAZOLE SODIUM 20 MG PO TBEC
20.0000 mg | DELAYED_RELEASE_TABLET | Freq: Every day | ORAL | Status: DC
  Administered 2012-10-08: 20 mg via ORAL
  Filled 2012-10-07: qty 1

## 2012-10-07 MED ORDER — IBUPROFEN 400 MG PO TABS
400.0000 mg | ORAL_TABLET | Freq: Every day | ORAL | Status: DC | PRN
  Administered 2012-10-08: 400 mg via ORAL
  Filled 2012-10-07 (×2): qty 1

## 2012-10-07 MED ORDER — ZOLPIDEM TARTRATE 5 MG PO TABS: 5.0000 mg | ORAL_TABLET | Freq: Every evening | ORAL | Status: DC | PRN

## 2012-10-07 MED ORDER — ONDANSETRON HCL 4 MG/2ML IJ SOLN: 4.0000 mg | Freq: Four times a day (QID) | INTRAMUSCULAR | Status: DC | PRN

## 2012-10-07 MED ORDER — SODIUM CHLORIDE 0.9 % IJ SOLN
3.0000 mL | Freq: Two times a day (BID) | INTRAMUSCULAR | Status: DC
  Administered 2012-10-07: 3 mL via INTRAVENOUS

## 2012-10-07 MED ORDER — DOCUSATE SODIUM 100 MG PO CAPS
100.0000 mg | ORAL_CAPSULE | Freq: Two times a day (BID) | ORAL | Status: DC
  Administered 2012-10-07 – 2012-10-08 (×2): 100 mg via ORAL
  Filled 2012-10-07 (×3): qty 1

## 2012-10-07 MED ORDER — OXYCODONE-ACETAMINOPHEN 5-325 MG PO TABS
1.0000 | ORAL_TABLET | Freq: Four times a day (QID) | ORAL | Status: DC | PRN
  Administered 2012-10-07 – 2012-10-08 (×2): 1 via ORAL
  Filled 2012-10-07 (×3): qty 1

## 2012-10-07 MED ORDER — SODIUM CHLORIDE 0.9 % IV SOLN: 250.0000 mL | INTRAVENOUS | Status: DC | PRN

## 2012-10-07 MED ORDER — ALUM & MAG HYDROXIDE-SIMETH 200-200-20 MG/5ML PO SUSP: 30.0000 mL | Freq: Four times a day (QID) | ORAL | Status: DC | PRN

## 2012-10-07 MED ORDER — SODIUM CHLORIDE 0.9 % IJ SOLN: 3.0000 mL | INTRAMUSCULAR | Status: DC | PRN

## 2012-10-07 MED ORDER — SENNOSIDES-DOCUSATE SODIUM 8.6-50 MG PO TABS
1.0000 | ORAL_TABLET | Freq: Every evening | ORAL | Status: DC | PRN
  Filled 2012-10-07: qty 1

## 2012-10-07 MED ORDER — NICOTINE 21 MG/24HR TD PT24
21.0000 mg | MEDICATED_PATCH | Freq: Every day | TRANSDERMAL | Status: DC
  Administered 2012-10-07 – 2012-10-08 (×2): 21 mg via TRANSDERMAL
  Filled 2012-10-07 (×2): qty 1

## 2012-10-07 MED ORDER — ACETAMINOPHEN 650 MG RE SUPP: 650.0000 mg | Freq: Four times a day (QID) | RECTAL | Status: DC | PRN

## 2012-10-07 MED ORDER — MORPHINE SULFATE 2 MG/ML IJ SOLN: 1.0000 mg | INTRAMUSCULAR | Status: DC | PRN

## 2012-10-07 MED ORDER — MEGESTROL ACETATE 40 MG PO TABS: 40.0000 mg | ORAL_TABLET | ORAL | Status: DC

## 2012-10-07 MED ORDER — ONDANSETRON HCL 4 MG PO TABS
4.0000 mg | ORAL_TABLET | Freq: Four times a day (QID) | ORAL | Status: DC | PRN
  Administered 2012-10-08: 4 mg via ORAL
  Filled 2012-10-07: qty 1

## 2012-10-07 MED ORDER — ONDANSETRON HCL 4 MG/2ML IJ SOLN: 4.0000 mg | Freq: Three times a day (TID) | INTRAMUSCULAR | Status: AC | PRN

## 2012-10-07 MED ORDER — PNEUMOCOCCAL VAC POLYVALENT 25 MCG/0.5ML IJ INJ
0.5000 mL | INJECTION | INTRAMUSCULAR | Status: AC
  Administered 2012-10-08: 0.5 mL via INTRAMUSCULAR
  Filled 2012-10-07: qty 0.5

## 2012-10-07 MED ORDER — SODIUM CHLORIDE 0.9 % IJ SOLN: 3.0000 mL | Freq: Two times a day (BID) | INTRAMUSCULAR | Status: DC

## 2012-10-07 MED ORDER — ENOXAPARIN SODIUM 40 MG/0.4ML ~~LOC~~ SOLN
40.0000 mg | SUBCUTANEOUS | Status: DC
  Administered 2012-10-07: 40 mg via SUBCUTANEOUS
  Filled 2012-10-07 (×2): qty 0.4

## 2012-10-07 MED ORDER — LISINOPRIL 20 MG PO TABS
20.0000 mg | ORAL_TABLET | Freq: Every day | ORAL | Status: DC
Start: 2012-10-08 — End: 2012-10-08
  Administered 2012-10-08: 20 mg via ORAL
  Filled 2012-10-07: qty 1

## 2012-10-07 MED ORDER — ONDANSETRON HCL 4 MG/2ML IJ SOLN
4.0000 mg | Freq: Once | INTRAMUSCULAR | Status: AC
  Administered 2012-10-07: 4 mg via INTRAVENOUS
  Filled 2012-10-07: qty 2

## 2012-10-07 MED ORDER — NITROGLYCERIN 0.4 MG SL SUBL: 0.4000 mg | SUBLINGUAL_TABLET | SUBLINGUAL | Status: DC | PRN

## 2012-10-07 MED ORDER — MEGESTROL ACETATE 400 MG/10ML PO SUSP
400.0000 mg | Freq: Every day | ORAL | Status: DC
  Filled 2012-10-07 (×2): qty 10

## 2012-10-07 NOTE — ED Notes (Signed)
Pt had to urinate immediately upon arrival.  Pt given bedside commode and sample collected and left at bedside

## 2012-10-07 NOTE — H&P (Signed)
[facility] History and Physical  [name] [id number] DOB: 07/19/1961 DOA: 10/07/2012  Referring physician:  PCP: Cheyenne Cipro, MD  Specialists: cardiology  Chief Complaint: chest pain  HPI: [name] is a 52 y.o. female with past medical history that includes HTN , gerd, IBS, diverticulitus, asthma,hyperlipidemia, anxiety who presents to ED from pain clinic  With cc chest pain. Information obtained from patient. She reports today she was at pain clinic engaged in pulmonary function tests when she developed sudden restrosternal chest pain. Denies radiation of pain, describes pain as constant pressure and dull. Nothing made worse and the NTG given helped. Rates the pain as 7/10 and characterizes as severe. Associated symptoms include no sob, nausea no diaphoresis, no vomiting. Pt reports that she has had intermittent CP for last 4 months and she has been seen by her PCP who referred her to GI. GI referred her to pain clinic for chronic abdominal pain. Symptoms came on suddenly, have subsided, characterized as severe. Given risk factors [facility] asked to admit for further eval treatment  Review of Systems: The patient denies anorexia,  vision loss, decreased hearing, hoarseness,  syncope, dyspnea on exertion, peripheral edema, balance deficits, hemoptysis,  melena, hematochezia, severe indigestion/heartburn, hematuria, incontinence, genital sores, muscle weakness, suspicious skin lesions, transient blindness, difficulty walking, , unusual weight change, abnormal bleeding, enlarged lymph nodes, angioedema, and breast masses.    Past Medical History  Diagnosis Date  . COPD (chronic obstructive pulmonary disease) 2000  . Carpal tunnel syndrome   . Diverticulitis 2008  . Pancreatitis 10/2011  . Schizophrenia   . RLS (restless legs syndrome)   . GERD (gastroesophageal reflux disease) 2008  . Bipolar 1 disorder   . Small bowel obstruction 2008  . Cervical cancer 1985  . History  of colon polyps 2009  . Anemia 2008  . Hypertension 2013  . Headache   . Anxiety   . Asthma   . Arthritis 2000  . Depression 2000  . HLD (hyperlipidemia) 2013  . IBS (irritable bowel syndrome) 2008  . Shortness of breath    Past Surgical History  Procedure Date  . Colon surgery 2008    6 inches of colon removed due to obstruction  . Partial hysterectomy 1995  . Total abdominal hysterectomy 1996   Social History:  reports that she has been smoking Cigarettes.  She has a 58.5 pack-year smoking history. She has never used smokeless tobacco. She reports that she does not drink alcohol or use illicit drugs. Pt employed as Armed forces logistics/support/administrative officer at [employer]. Lives at home and grandson lives with her. Independent ADL's   Allergies  Allergen Reactions  . Doxycycline Anaphylaxis and Hives  . Aspirin Other (See Comments)    Internal bleeding  . Hydrocodone-Acetaminophen Nausea And Vomiting  . Iohexol Itching    Pt has itching nose after iv contrast injection    Family History  Problem Relation Age of Onset  . Other Mother     many bowel obstructions  . Kidney cancer Father   . Bone cancer Father   . Diabetes Father   . Diabetes Daughter   . Heart disease Mother   . Heart disease Father      Prior to Admission medications   Medication Sig Start Date End Date Taking? Authorizing Provider  Artificial Tear Ointment (LUBRICANT EYE OP) Apply 2 drops to eye every morning.   Yes Historical Provider, MD  ibuprofen (ADVIL,MOTRIN) 200 MG tablet Take 400 mg by mouth daily as needed. For headache  Yes Historical Provider, MD  lisinopril (PRINIVIL,ZESTRIL) 20 MG tablet Take 20 mg by mouth daily.   Yes Historical Provider, MD  megestrol (MEGACE) 40 MG tablet Take 40 mg by mouth once a week.   Yes Historical Provider, MD  oxyCODONE-acetaminophen (PERCOCET/ROXICET) 5-325 MG per tablet Take 1 tablet by mouth 4 (four) times daily as needed. For pain   Yes Historical Provider, MD  pantoprazole  (PROTONIX) 20 MG tablet Take 20 mg by mouth daily.   Yes Historical Provider, MD   Physical Exam: Filed Vitals:   10/07/12 1315 10/07/12 1330 10/07/12 1415 10/07/12 1444  BP: 124/76 137/65 127/58 142/69  Pulse: 51 59 56 52  Temp:    98 F (36.7 C)  TempSrc:    Oral  Resp: 20 18 13 16   Height:    5' (1.524 m)  Weight:    49.6 kg (109 lb 5.6 oz)  SpO2: 98% 99% 99% 99%     General:  Awake alert somewhat thin, looks older than stated age  Eyes: PERRL EOMI no scleral icterus  ENT: ears clear, nose without drainage, mucus membranes mouth pink slightly dry  Neck: supple, no JVD full ROM no lymphadenopathy  Cardiovascular: RRR No MGR No LEE PPP  Respiratory: normal effort. BS somewhat distant but clear No wheeze  Abdomen: soft +BS non-tender to palpation  Skin: warm/dry, no rash/lesion  Musculoskeletal: MOE no joint swelling/erythema. Non-tender to palpation  Psychiatric: appropriate, cooperative  Neurologic: cranial nerve II-XII intact speech clear facial symetry  Labs on Admission:  Basic Metabolic Panel:  Lab 99991111 1212  NA 139  K 4.3  CL 105  CO2 23  GLUCOSE 104*  BUN 9  CREATININE 0.80  CALCIUM 9.8  MG --  PHOS --   Liver Function Tests:  Lab 10/07/12 1212  AST 18  ALT 11  ALKPHOS 91  BILITOT 0.3  PROT 7.2  ALBUMIN 4.2   No results found for this basename: LIPASE:5,AMYLASE:5 in the last 168 hours No results found for this basename: AMMONIA:5 in the last 168 hours CBC:  Lab 10/07/12 1212  WBC 7.4  NEUTROABS 3.9  HGB 13.4  HCT 39.0  MCV 92.9  PLT 349   Cardiac Enzymes: No results found for this basename: CKTOTAL:5,CKMB:5,CKMBINDEX:5,TROPONINI:5 in the last 168 hours  BNP (last 3 results) No results found for this basename: PROBNP:3 in the last 8760 hours CBG: No results found for this basename: GLUCAP:5 in the last 168 hours  Radiological Exams on Admission: Dg Chest 2 View  10/07/2012  *RADIOLOGY REPORT*  Clinical Data: Chest pain   CHEST - 2 VIEW  Comparison: 04/15/2012  Findings: The heart is enlarged.  Pulmonary vascularity is within normal limits.  Lungs are hyperaerated with bronchitic and interstitial changes.  Right clavicle deformity is stable.  No pneumothorax or pleural effusion.  IMPRESSION: No active cardiopulmonary disease.  Chronic changes are noted.   Original Report Authenticated By: Marybelle Killings, M.D.     EKG: Independently reviewed. Similar to previous 1/13 with slightly prominent T waves  Assessment/Plan Principal Problem:  *Chest pain, atypical Active Problems:  ANXIETY  HYPERTENSION  ASTHMA  GERD  Irritable bowel syndrome  1. Chest pain: atypical. Will admit to tele. Improved on admission.  Will provide NTG and morphine, O2 support. Will cycle CE and repeat EKG in am. Pain relieved with NTG. Will check lipid panel and request cardiac consult given risk factors. Pt given ASA per EMS. Chart indicates allergy with effect nausea. i did not  start.  Likely need stress test but perhaps could be done OP.  2. HTN: fair control on admission. Pt reports difficult to control of late presumably due to pain issue. Lisinopril recently increased to 20mg . Will continue  3. IBS/diverticulosis/ chronic abdominal pain syndrome: currently at baseline. Unclear how much related to perceived chest pain. Continue home PPI, malox, pain med. Monitor   4. Asthma: at base line. Not 02 dependant. Chest xray without cardio pulmonary disease. Monitor sats with VS. 02 as needed  5. Hyperlipidemia: will check lipid panel. Consider statin depending on result   6. GERD: at baseline. Continue home med  7. Anxiety: stable at base line. monitor    [facility]  Code Status: full Family Communication:  Disposition Plan: home when ready hopefully in am  Time spent: 63 minutes  [facility]   If 7PM-7AM, please contact night-coverage [email or web address] Password [facility] 10/07/2012, 3:07  PM   Attending -seen and examined. Agree with above assessment and plan. Admitted with chest pain-sounds atypical but does have several risks factors-HTN, smoker, family history and dyslipidemia. Will consult cards for risk stratification. Is allergic to Aspirin, may need to start Plavix if troponins are elevated.  Nena Alexander MD

## 2012-10-07 NOTE — Consult Note (Addendum)
CARDIOLOGY CONSULT NOTE    [name] MRN: [id number] DOB/AGE: Jul 04, 1961 52 y.o.  Admit date: 10/07/2012 Referring Physician:  Nigel Bridgeman Primary Physician: Rachell Cipro, MD Primary Cardiologist:  New Reason for Consultation: Chest Pain  Principal Problem:  *Chest pain, atypical Active Problems:  ANXIETY  HYPERTENSION  ASTHMA  GERD  Irritable bowel syndrome   HPI:   52 yo with chronic pain. Mostly epigastric from previous diverticulitis and colon resection.  Takes percocet 3-5 x/day.  Was establishing at pain clinic  And doing a breathing test and got chest and epigastric pain.  Chest pain atypical and non exertional.  Persisted after test stopped. Waxing and waning and  Still has it.  ECG is normal with no ischemia or pericardial changes.  Still smokes.  History of COPD.  No pleuritic component cough sputum or hemoptysis.  No  postional component.  POC troponin normal.  Has had elevated lipase.  No recent stress testing.  Pain is sharp and non radiating.  No recent trauma.  CRF's HTN On Rx and smoking   @ROS @ All other systems reviewed and negative except as noted above  Past Medical History  Diagnosis Date  . COPD (chronic obstructive pulmonary disease) 2000  . Carpal tunnel syndrome   . Diverticulitis 2008  . Pancreatitis 10/2011  . Schizophrenia   . RLS (restless legs syndrome)   . GERD (gastroesophageal reflux disease) 2008  . Bipolar 1 disorder   . Small bowel obstruction 2008  . Cervical cancer 1985  . History of colon polyps 2009  . Anemia 2008  . Hypertension 2013  . Headache   . Anxiety   . Asthma   . Arthritis 2000  . Depression 2000  . HLD (hyperlipidemia) 2013  . IBS (irritable bowel syndrome) 2008  . Shortness of breath     Family History  Problem Relation Age of Onset  . Other Mother     many bowel obstructions  . Kidney cancer Father   . Bone cancer Father   . Diabetes Father   . Diabetes Daughter   . Heart disease Mother     . Heart disease Father     History   Social History  . Marital Status: Divorced    Spouse Name: N/A    Number of Children: 1  . Years of Education: N/A   Occupational History  . grill cook/cashier    Social History Main Topics  . Smoking status: Current Every Day Smoker -- 1.5 packs/day for 39 years    Types: Cigarettes  . Smokeless tobacco: Never Used  . Alcohol Use: No  . Drug Use: No  . Sexually Active: Yes    Birth Control/ Protection: Post-menopausal   Other Topics Concern  . Not on file   Social History Narrative  . No narrative on file    Past Surgical History  Procedure Date  . Colon surgery 2008    6 inches of colon removed due to obstruction  . Partial hysterectomy 1995  . Total abdominal hysterectomy 1996        . docusate sodium  100 mg Oral BID  . enoxaparin (LOVENOX) injection  40 mg Subcutaneous Q24H  . guaiFENesin  600 mg Oral BID  . lisinopril  20 mg Oral Daily  . megestrol  400 mg Oral Daily  . pantoprazole  20 mg Oral Daily  . pneumococcal 23 valent vaccine  0.5 mL Intramuscular Tomorrow-1000  . sodium chloride  3 mL Intravenous Q12H  . sodium  chloride  3 mL Intravenous Q12H      Physical Exam: Blood pressure 142/69, pulse 52, temperature 98 F (36.7 C), temperature source Oral, resp. rate 16, height 5' (1.524 m), weight 109 lb 5.6 oz (49.6 kg), SpO2 99.00%.    Affect appropriate Healthy:  appears stated age 52: normal Neck supple with no adenopathy JVP normal no bruits no thyromegaly Lungs clear with no wheezing and good diaphragmatic motion Heart:  S1/S2 no murmur, no rub, gallop or click PMI normal Abdomen: mild epigastric pain , BS positve, no tenderness, no AAA no bruit.  No HSM or HJR Distal pulses intact with no bruits No edema Neuro non-focal Skin warm and dry No muscular weakness   Labs:   Lab Results  Component Value Date   WBC 7.4 10/07/2012   HGB 13.4 10/07/2012   HCT 39.0 10/07/2012   MCV 92.9 10/07/2012   PLT  349 10/07/2012    Lab 10/07/12 1212  NA 139  K 4.3  CL 105  CO2 23  BUN 9  CREATININE 0.80  CALCIUM 9.8  PROT 7.2  BILITOT 0.3  ALKPHOS 91  ALT 11  AST 18  GLUCOSE 104*      Radiology: Dg Chest 2 View  10/07/2012  *RADIOLOGY REPORT*  Clinical Data: Chest pain  CHEST - 2 VIEW  Comparison: 04/15/2012  Findings: The heart is enlarged.  Pulmonary vascularity is within normal limits.  Lungs are hyperaerated with bronchitic and interstitial changes.  Right clavicle deformity is stable.  No pneumothorax or pleural effusion.  IMPRESSION: No active cardiopulmonary disease.  Chronic changes are noted.   Original Report Authenticated By: Marybelle Killings, M.D.     EKG: NSR normal ECG   ASSESSMENT AND PLAN:  Chest Pain:  Atypical with normal exam , ECG, troponin and CXR.  Will order stress echo for a.m. Epigastric Pain.  Per primary service abdomen non acute check amylase and lipas. Chronic Pain:  F/U pain clinic percocet for now.   Smoking: CXR ok no motivation to quit.   HTN:  Continue ACE f/u Dr Ernie Hew  Signed: Jenkins Rouge 10/07/2012, 4:08 PM

## 2012-10-07 NOTE — ED Notes (Signed)
Pt in NSR in route in EMS

## 2012-10-07 NOTE — ED Notes (Signed)
Pt in via [facility] from [place], pt c/o non radiating mid CP while doing exercise that required holding her breath for periods of time, pt received 324 ASA & x 1 SL nitro prior to arrival, pt A&O x4, follows commands, speaks in complete sentences, pt anxious in route

## 2012-10-07 NOTE — ED Provider Notes (Signed)
History     CSN: [id number]  Arrival date & time 10/07/12  1121   First MD Initiated Contact with Patient 10/07/12 1125      Chief Complaint  Patient presents with  . Chest Pain    (Consider location/radiation/quality/duration/timing/severity/associated sxs/prior treatment) HPI Comments: Patient with history of COPD, smoking, hypertension, high blood pressure, and family history of coronary artery disease -- presents today with complaint of chest pain. Patient was at her first pain clinic appointment when she developed 6-7/10 left chest pain that radiated to her left shoulder. She has pain due to adhesions from diverticulitis. EMS was called. Patient received one nitroglycerin which reduced her pain to 1/10. She was also given aspirin prior to arrival. Patient notes that she has had episodes of chest pain over the past 2 months that have mostly been associated with activity and resolved with rest. Patient states that she awoke 5 nights ago from sleep with chest pain. This is unusual for her. She followed up with her doctor and had a normal EKG. There was mention of having the patient see cardiologist. Patient had a remote stress test in the recent workups. Mother and father both had heart attacks in their 81s and 49's. Patient has shortness of breath that is at baseline due to COPD, she is not oxygen dependent. Onset of symptoms acute. Course is improving.  The history is provided by the patient.    Past Medical History  Diagnosis Date  . COPD (chronic obstructive pulmonary disease) 2000  . Carpal tunnel syndrome   . Diverticulitis 2008  . Pancreatitis 10/2011  . Schizophrenia   . RLS (restless legs syndrome)   . GERD (gastroesophageal reflux disease) 2008  . Bipolar 1 disorder   . Small bowel obstruction 2008  . Cervical cancer 1985  . History of colon polyps 2009  . Anemia 2008  . Hypertension 2013  . Headache   . Anxiety   . Asthma   . Arthritis 2000  . Depression 2000  . HLD  (hyperlipidemia) 2013  . IBS (irritable bowel syndrome) 2008    Past Surgical History  Procedure Date  . Colon surgery 2008    6 inches of colon removed due to obstruction  . Partial hysterectomy 1995  . Total abdominal hysterectomy 1996    Family History  Problem Relation Age of Onset  . Other Mother     many bowel obstructions  . Kidney cancer Father   . Bone cancer Father   . Diabetes Father   . Diabetes Daughter   . Heart disease Mother   . Heart disease Father     History  Substance Use Topics  . Smoking status: Current Every Day Smoker    Types: Cigarettes  . Smokeless tobacco: Never Used  . Alcohol Use: No    OB History    Grav Para Term Preterm Abortions TAB SAB Ect Mult Living                  Review of Systems  Constitutional: Negative for fever.  HENT: Negative for neck pain.   Eyes: Negative for redness.  Respiratory: Positive for shortness of breath. Negative for cough.   Cardiovascular: Positive for chest pain. Negative for palpitations and leg swelling.  Gastrointestinal: Negative for nausea, vomiting and abdominal pain.  Genitourinary: Negative for dysuria.  Musculoskeletal: Negative for back pain.  Skin: Negative for rash.  Neurological: Negative for syncope and light-headedness.    Allergies  Doxycycline; Aspirin; Hydrocodone-acetaminophen; and Iohexol  Home Medications   Current Outpatient Rx  Name  Route  Sig  Dispense  Refill  . ASPIRIN-ACETAMINOPHEN-CAFFEINE 250-250-65 MG PO TABS   Oral   Take 2 tablets by mouth every 6 (six) hours as needed. For pain         . OVER THE COUNTER MEDICATION   Both Eyes   Place 1 drop into both eyes daily as needed. Re-wetting drop for dry eyes           BP 152/69  Pulse 58  Temp 97.6 F (36.4 C) (Oral)  Resp 23  SpO2 100%  Physical Exam  Nursing note and vitals reviewed. Constitutional: She appears well-developed and well-nourished.  HENT:  Head: Normocephalic and atraumatic.    Mouth/Throat: Mucous membranes are normal. Mucous membranes are not dry.  Eyes: Conjunctivae normal are normal.  Neck: Trachea normal and normal range of motion. Neck supple. Normal carotid pulses and no JVD present. No muscular tenderness present. Carotid bruit is not present. No tracheal deviation present.  Cardiovascular: Normal rate, regular rhythm, S1 normal, S2 normal, normal heart sounds and intact distal pulses.  Exam reveals no decreased pulses.   No murmur heard. Pulses:      Radial pulses are 2+ on the right side, and 2+ on the left side.  Pulmonary/Chest: Effort normal. No respiratory distress. She has no wheezes. She exhibits no tenderness.  Abdominal: Soft. Normal aorta and bowel sounds are normal. There is tenderness. There is no rebound and no guarding.       Mild, generalized, patient states at baseline.   Musculoskeletal: Normal range of motion.  Neurological: She is alert.  Skin: Skin is warm and dry. She is not diaphoretic. No cyanosis. No pallor.  Psychiatric: She has a normal mood and affect.    ED Course  Procedures (including critical care time)  Labs Reviewed  CBC WITH DIFFERENTIAL - Abnormal; Notable for the following:    Basophils Relative 2 (*)     All other components within normal limits  COMPREHENSIVE METABOLIC PANEL - Abnormal; Notable for the following:    Glucose, Bld 104 (*)     GFR calc non Af Amer 85 (*)     All other components within normal limits  URINALYSIS, ROUTINE W REFLEX MICROSCOPIC  POCT I-STAT TROPONIN I   Dg Chest 2 View  10/07/2012  *RADIOLOGY REPORT*  Clinical Data: Chest pain  CHEST - 2 VIEW  Comparison: 04/15/2012  Findings: The heart is enlarged.  Pulmonary vascularity is within normal limits.  Lungs are hyperaerated with bronchitic and interstitial changes.  Right clavicle deformity is stable.  No pneumothorax or pleural effusion.  IMPRESSION: No active cardiopulmonary disease.  Chronic changes are noted.   Original Report  Authenticated By: Marybelle Killings, M.D.      1. Chest pain     11:50 AM Patient seen and examined. Work-up initiated. She has received ASA at pain clinic. EKG reviewed.    Vital signs reviewed and are as follows: Filed Vitals:   10/07/12 1132  BP: 152/69  Pulse: 58  Temp: 97.6 F (36.4 C)  Resp: 23    Date: 10/07/2012  Rate: 57  Rhythm: sinus bradycardia  QRS Axis: normal  Intervals: normal  ST/T Wave abnormalities: normal  Conduction Disutrbances:none  Narrative Interpretation: anteroseptal q-waves  Old EKG Reviewed: unchanged from 10/16/11  1:40 PM Spoke with [facility] who will see and admit for ACS rule-out.    MDM  Chest pain, story suggestive of unstable angina.  Will admit for rule-out and further eval. Initial work-up reassuring.         Carlisle Cater, [place] 10/07/12 1341

## 2012-10-08 DIAGNOSIS — R0789 Other chest pain: Secondary | ICD-10-CM

## 2012-10-08 DIAGNOSIS — R072 Precordial pain: Secondary | ICD-10-CM

## 2012-10-08 DIAGNOSIS — F329 Major depressive disorder, single episode, unspecified: Secondary | ICD-10-CM

## 2012-10-08 LAB — LIPID PANEL
Cholesterol: 190 mg/dL (ref 0–200)
Total CHOL/HDL Ratio: 3.7 RATIO
Triglycerides: 152 mg/dL — ABNORMAL HIGH (ref <150)
VLDL: 30 mg/dL (ref 0–40)

## 2012-10-08 MED ORDER — NICOTINE 21 MG/24HR TD PT24: 1.0000 | MEDICATED_PATCH | Freq: Every day | TRANSDERMAL | Status: DC

## 2012-10-08 NOTE — Progress Notes (Signed)
[name], female   DOB: 13-Mar-1961, 52 y.o.   MRN: [id number]    Subjective:  Atypical chest pain and abdominal pain  Objective:  Filed Vitals:   10/07/12 1444 10/07/12 2053 10/08/12 0500 10/08/12 0643  BP: 142/69 110/65 90/61 125/73  Pulse: 52 49 57   Temp: 98 F (36.7 C) 98.1 F (36.7 C) 97.5 F (36.4 C)   TempSrc: Oral Oral Oral   Resp: 16 16 16    Height: 5' (1.524 m)     Weight: 109 lb 5.6 oz (49.6 kg)     SpO2: 99% 97% 98%     Intake/Output from previous day: No intake or output data in the 24 hours ending 10/08/12 0835  Physical Exam: Affect appropriate Healthy:  appears stated age HEENT: normal Neck supple with no adenopathy JVP normal no bruits no thyromegaly Lungs clear with no wheezing and good diaphragmatic motion Heart:  S1/S2 no murmur, no rub, gallop or click PMI normal Abdomen: benighn, BS positve, no tenderness, no AAA S/P diverticular surgery no bruit.  No HSM or HJR Distal pulses intact with no bruits No edema Neuro non-focal Skin warm and dry No muscular weakness   Lab Results: Basic Metabolic Panel:  Basename 10/07/12 1635 10/07/12 1212  NA -- 139  K -- 4.3  CL -- 105  CO2 -- 23  GLUCOSE -- 104*  BUN -- 9  CREATININE 0.91 0.80  CALCIUM -- 9.8  MG -- --  PHOS -- --   Liver Function Tests:  [facility] 10/07/12 1212  AST 18  ALT 11  ALKPHOS 91  BILITOT 0.3  PROT 7.2  ALBUMIN 4.2    Basename 10/07/12 1635  LIPASE 47  AMYLASE --   CBC:  Basename 10/07/12 1635 10/07/12 1212  WBC 7.4 7.4  NEUTROABS -- 3.9  HGB 13.3 13.4  HCT 39.4 39.0  MCV 94.3 92.9  PLT 352 349   Cardiac Enzymes:  Basename 10/08/12 0228 10/07/12 2024 10/07/12 1635  CKTOTAL -- -- --  CKMB -- -- --  CKMBINDEX -- -- --  TROPONINI <0.30 <0.30 <0.30   BNP: No components found with this basename: POCBNP:3 D-Dimer: No results found for this basename: DDIMER:2 in the last 72 hours Hemoglobin A1C: No results found for this basename:  HGBA1C in the last 72 hours Fasting Lipid Panel:  Basename 10/08/12 0228  CHOL 190  HDL 51  LDLCALC 109*  TRIG 152*  CHOLHDL 3.7  LDLDIRECT --   Thyroid Function Tests: No results found for this basename: TSH,T4TOTAL,FREET3,T3FREE,THYROIDAB in the last 72 hours Anemia Panel: No results found for this basename: VITAMINB12,FOLATE,FERRITIN,TIBC,IRON,RETICCTPCT in the last 72 hours  Imaging: Dg Chest 2 View  10/07/2012  *RADIOLOGY REPORT*  Clinical Data: Chest pain  CHEST - 2 VIEW  Comparison: 04/15/2012  Findings: The heart is enlarged.  Pulmonary vascularity is within normal limits.  Lungs are hyperaerated with bronchitic and interstitial changes.  Right clavicle deformity is stable.  No pneumothorax or pleural effusion.  IMPRESSION: No active cardiopulmonary disease.  Chronic changes are noted.   Original Report Authenticated By: Marybelle Killings, M.D.     Cardiac Studies:  ECG:  NSR normal ECG 10/07/12    Telemetry: NSR no arrhythmia 10/08/2012   Medications:     . docusate sodium  100 mg Oral BID  . enoxaparin (LOVENOX) injection  40 mg Subcutaneous Q24H  . guaiFENesin  600 mg Oral BID  . lisinopril  20 mg Oral Daily  . megestrol  400 mg  Oral Daily  . nicotine  21 mg Transdermal Daily  . pantoprazole  20 mg Oral Daily  . pneumococcal 23 valent vaccine  0.5 mL Intramuscular Tomorrow-1000  . sodium chloride  3 mL Intravenous Q12H  . sodium chloride  3 mL Intravenous Q12H       Assessment/Plan:  Atypical chest pain:  For stress echo this am ok to D/C form our standpoint if negative HTN:  Continue ACE low sodium diet Smoking:  Nicotine patch CXR ok Chronic Pain: primary issue f/u clinic  Jenkins Rouge 10/08/2012, 8:35 AM

## 2012-10-08 NOTE — Discharge Summary (Signed)
Physician Discharge Summary  [name] [id number] DOB: 05-30-1961 DOA: 10/07/2012  PCP: Rachell Cipro, MD  Admit date: 10/07/2012 Discharge date: 10/08/2012  Time spent: 35 minutes  Recommendations for Outpatient Follow-up:  1. Stop smoking  Discharge Diagnoses:  Principal Problem:  *Chest pain, atypical Active Problems:  ANXIETY  HYPERTENSION  ASTHMA  GERD  Irritable bowel syndrome   Discharge Condition: improved  Diet recommendation: cardiac  Filed Weights   10/07/12 1444  Weight: 49.6 kg (109 lb 5.6 oz)    History of present illness:  [name] is a 52 y.o. female with past medical history that includes HTN , gerd, IBS, diverticulitus, asthma,hyperlipidemia, anxiety who presents to ED from pain clinic  With cc chest pain. Information obtained from patient. She reports today she was at pain clinic engaged in pulmonary function tests when she developed sudden restrosternal chest pain. Denies radiation of pain, describes pain as constant pressure and dull. Nothing made worse and the NTG given helped. Rates the pain as 7/10 and characterizes as severe. Associated symptoms include no sob, nausea no diaphoresis, no vomiting. Pt reports that she has had intermittent CP for last 4 months and she has been seen by her PCP who referred her to GI. GI referred her to pain clinic for chronic abdominal pain. Symptoms came on suddenly, have subsided, characterized as severe. Given risk factors [facility] asked to admit for further eval treatment   Hospital Course:  Chest Pain: Atypical with normal exam , ECG, troponin and CXR.  stress echo done- see results below  Epigastric Pain. Per primary service abdomen non acute, lipase ok- being worked up as an outpatient.  Chronic Pain: F/U pain clinic   Smoking: given patches for help with quitting will need to be titrated down by PCP HTN: Continue ACE    Procedures:  Stress echo: Rest:  - LV size was normal. - LV global systolic  function was normal. - Normal wall motion; no LV regional wall motion abnormalities. Stress:  - LV global systolic function was vigorous. - No evidence for new LV regional wall motion abnormalities.  ------------------------------------------------------------ Stress echo results: This is intrepreted as a nondiagnostic stress echo. The ECG was negative for ischemia. The images were obtained at a submaximal HR (73 % of predeicted maximal HR for the best first image, 56% predicted maximal HR for the last image). There were no segmental wall motion abnormalities to suggest ischemia at maximal exercise effort.  Imp: No evidence of ischemia but images were obtained at a submaximal level suggest Lexiscan Myoview if there is significant concern for ischemia.     Consultations:  cardiology  Discharge Exam: Filed Vitals:   10/07/12 1444 10/07/12 2053 10/08/12 0500 10/08/12 0643  BP: 142/69 110/65 90/61 125/73  Pulse: 52 49 57   Temp: 98 F (36.7 C) 98.1 F (36.7 C) 97.5 F (36.4 C)   TempSrc: Oral Oral Oral   Resp: 16 16 16    Height: 5' (1.524 m)     Weight: 49.6 kg (109 lb 5.6 oz)     SpO2: 99% 97% 98%     General: A+Ox3, NAd Cardiovascular: rrr Respiratory: clear anterior  Discharge Instructions      Discharge Orders    Future Orders Please Complete By Expires   Diet - low sodium heart healthy      Increase activity slowly      Discharge instructions      Comments:   Stop smoking       Medication List  As of 10/08/2012 12:26 PM    TAKE these medications         ibuprofen 200 MG tablet   Commonly known as: ADVIL,MOTRIN   Take 400 mg by mouth daily as needed. For headache      lisinopril 20 MG tablet   Commonly known as: PRINIVIL,ZESTRIL   Take 20 mg by mouth daily.      LUBRICANT EYE OP   Apply 2 drops to eye every morning.      megestrol 40 MG/ML suspension   Commonly known as: MEGACE   Take 200 mg by mouth daily. Take 1 teaspoonful daily.        nicotine 21 mg/24hr patch   Commonly known as: NICODERM CQ - dosed in mg/24 hours   Place 1 patch onto the skin daily.      oxyCODONE-acetaminophen 5-325 MG per tablet   Commonly known as: PERCOCET/ROXICET   Take 1 tablet by mouth 4 (four) times daily as needed. For pain      pantoprazole 20 MG tablet   Commonly known as: PROTONIX   Take 20 mg by mouth daily.        Follow-up Information    Follow up with [facility], MD. In 2 weeks.   Contact information:   [address], [place] [address] [phone]           The results of significant diagnostics from this hospitalization (including imaging, microbiology, ancillary and laboratory) are listed below for reference.    Significant Diagnostic Studies: Dg Chest 2 View  10/07/2012  *RADIOLOGY REPORT*  Clinical Data: Chest pain  CHEST - 2 VIEW  Comparison: 04/15/2012  Findings: The heart is enlarged.  Pulmonary vascularity is within normal limits.  Lungs are hyperaerated with bronchitic and interstitial changes.  Right clavicle deformity is stable.  No pneumothorax or pleural effusion.  IMPRESSION: No active cardiopulmonary disease.  Chronic changes are noted.   Original Report Authenticated By: Marybelle Killings, M.D.     Microbiology: No results found for this or any previous visit (from the past 240 hour(s)).   Labs: Basic Metabolic Panel:  Lab [id number] 1635 10/07/12 1212  NA -- 139  K -- 4.3  CL -- 105  CO2 -- 23  GLUCOSE -- 104*  BUN -- 9  CREATININE 0.91 0.80  CALCIUM -- 9.8  MG -- --  PHOS -- --   Liver Function Tests:  Lab 10/07/12 1212  AST 18  ALT 11  ALKPHOS 91  BILITOT 0.3  PROT 7.2  ALBUMIN 4.2    Lab 10/07/12 1635  LIPASE 47  AMYLASE --   No results found for this basename: AMMONIA:5 in the last 168 hours CBC:  Lab 10/07/12 1635 10/07/12 1212  WBC 7.4 7.4  NEUTROABS -- 3.9  HGB 13.3 13.4  HCT 39.4 39.0  MCV 94.3 92.9  PLT 352 349   Cardiac Enzymes:  Lab  10/08/12 0228 10/07/12 2024 10/07/12 1635  CKTOTAL -- -- --  CKMB -- -- --  CKMBINDEX -- -- --  TROPONINI <0.30 <0.30 <0.30   BNP: BNP (last 3 results) No results found for this basename: PROBNP:3 in the last 8760 hours CBG: No results found for this basename: GLUCAP:5 in the last 168 hours     Signed:  Eulogio Bear  [facility] 10/08/2012, 12:26 PM

## 2012-10-08 NOTE — Progress Notes (Signed)
Utilization review completed.  

## 2012-10-08 NOTE — Progress Notes (Signed)
  Echocardiogram Echocardiogram Stress Test has been performed.  Mauricio Po 10/08/2012, 9:52 AM

## 2012-10-09 NOTE — ED Provider Notes (Signed)
Medical screening examination/treatment/procedure(s) were performed by non-physician practitioner and as supervising physician I was immediately available for consultation/collaboration.   Sharyon Cable, MD 10/09/12 [phone]

## 2012-11-07 ENCOUNTER — Emergency Department (HOSPITAL_COMMUNITY): Payer: Medicaid Other

## 2012-11-07 ENCOUNTER — Encounter (HOSPITAL_COMMUNITY): Payer: Self-pay | Admitting: Emergency Medicine

## 2012-11-07 ENCOUNTER — Emergency Department (HOSPITAL_COMMUNITY)
Admission: EM | Admit: 2012-11-07 | Discharge: 2012-11-07 | Disposition: A | Discharge status: Home / Self Care | Payer: Medicaid Other | Attending: Emergency Medicine | Admitting: Emergency Medicine

## 2012-11-07 DIAGNOSIS — Z8541 Personal history of malignant neoplasm of cervix uteri: Secondary | ICD-10-CM | POA: Insufficient documentation

## 2012-11-07 DIAGNOSIS — Z79899 Other long term (current) drug therapy: Secondary | ICD-10-CM | POA: Insufficient documentation

## 2012-11-07 DIAGNOSIS — R609 Edema, unspecified: Secondary | ICD-10-CM | POA: Insufficient documentation

## 2012-11-07 DIAGNOSIS — R6884 Jaw pain: Secondary | ICD-10-CM | POA: Insufficient documentation

## 2012-11-07 DIAGNOSIS — R0602 Shortness of breath: Secondary | ICD-10-CM | POA: Insufficient documentation

## 2012-11-07 DIAGNOSIS — IMO0002 Reserved for concepts with insufficient information to code with codable children: Secondary | ICD-10-CM | POA: Insufficient documentation

## 2012-11-07 DIAGNOSIS — J449 Chronic obstructive pulmonary disease, unspecified: Secondary | ICD-10-CM | POA: Insufficient documentation

## 2012-11-07 DIAGNOSIS — Z8719 Personal history of other diseases of the digestive system: Secondary | ICD-10-CM | POA: Insufficient documentation

## 2012-11-07 DIAGNOSIS — Z8639 Personal history of other endocrine, nutritional and metabolic disease: Secondary | ICD-10-CM | POA: Insufficient documentation

## 2012-11-07 DIAGNOSIS — R0789 Other chest pain: Secondary | ICD-10-CM | POA: Insufficient documentation

## 2012-11-07 DIAGNOSIS — K219 Gastro-esophageal reflux disease without esophagitis: Secondary | ICD-10-CM | POA: Insufficient documentation

## 2012-11-07 DIAGNOSIS — F17200 Nicotine dependence, unspecified, uncomplicated: Secondary | ICD-10-CM | POA: Insufficient documentation

## 2012-11-07 DIAGNOSIS — Z860100 Personal history of colon polyps, unspecified: Secondary | ICD-10-CM | POA: Insufficient documentation

## 2012-11-07 DIAGNOSIS — Z8659 Personal history of other mental and behavioral disorders: Secondary | ICD-10-CM | POA: Insufficient documentation

## 2012-11-07 DIAGNOSIS — I10 Essential (primary) hypertension: Secondary | ICD-10-CM | POA: Insufficient documentation

## 2012-11-07 DIAGNOSIS — Z8669 Personal history of other diseases of the nervous system and sense organs: Secondary | ICD-10-CM | POA: Insufficient documentation

## 2012-11-07 DIAGNOSIS — J4489 Other specified chronic obstructive pulmonary disease: Secondary | ICD-10-CM | POA: Insufficient documentation

## 2012-11-07 DIAGNOSIS — Z862 Personal history of diseases of the blood and blood-forming organs and certain disorders involving the immune mechanism: Secondary | ICD-10-CM | POA: Insufficient documentation

## 2012-11-07 LAB — BASIC METABOLIC PANEL
BUN: 10 mg/dL (ref 6–23)
CO2: 25 mEq/L (ref 19–32)
Chloride: 104 mEq/L (ref 96–112)
Creatinine, Ser: 0.93 mg/dL (ref 0.50–1.10)
Glucose, Bld: 82 mg/dL (ref 70–99)
Potassium: 4.5 mEq/L (ref 3.5–5.1)

## 2012-11-07 LAB — CBC
HCT: 36.3 % (ref 36.0–46.0)
Hemoglobin: 12.1 g/dL (ref 12.0–15.0)
MCV: 95.3 fL (ref 78.0–100.0)
RBC: 3.81 MIL/uL — ABNORMAL LOW (ref 3.87–5.11)
RDW: 13.3 % (ref 11.5–15.5)
WBC: 8.1 10*3/uL (ref 4.0–10.5)

## 2012-11-07 MED ORDER — ALBUTEROL SULFATE (5 MG/ML) 0.5% IN NEBU
2.5000 mg | INHALATION_SOLUTION | RESPIRATORY_TRACT | Status: DC
  Administered 2012-11-07: 2.5 mg via RESPIRATORY_TRACT
  Filled 2012-11-07 (×2): qty 0.5

## 2012-11-07 MED ORDER — IPRATROPIUM BROMIDE 0.02 % IN SOLN
0.5000 mg | RESPIRATORY_TRACT | Status: DC
  Administered 2012-11-07: 0.5 mg via RESPIRATORY_TRACT
  Filled 2012-11-07 (×2): qty 2.5

## 2012-11-07 NOTE — ED Provider Notes (Signed)
MSE was initiated and I personally evaluated the patient and placed orders (if any) at  5:10 PM on November 07, 2012. CBC, bmet, troponin, cxr, ekg, duo-neb treatment. 52 year old female presents to the emergency department from her primary care physician's office due to increasing shortness of breath. 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 Shortness of breath worse on exertion. When she saw her PCP earlier today she was advised to go to the emergency room for a full workup. Denies chest pain, nausea, vomiting or diaphoresis. She had a stress test last month at [facility] which was negative. Admits to having left shoulder and arm pain on and off recently. States her ankles and hands get swollen when she wakes up in the morning. Her PCP told her she may have to get cardiac cath. On exam patient is well-developed, well-nourished and appears in no apparent distress. Heart tachycardic with regular rhythm. Lungs with mild inspiratory and expiratory wheezes scattered. Labs ordered an duo nebs started. She'll be moved to the acute side of the ED The patient appears stable so that the remainder of the MSE may be completed by another provider.  Illene Labrador, PA-C 11/07/12 1714

## 2012-11-07 NOTE — ED Notes (Addendum)
Pt is sent by Dr. Lorenza Burton for work up for recent increase in SOB. Pt in NAD. Sats 99% on room air. Pt says "i walked from the parking light and to the building and thought i was going to pass out...and was very winded."  Pt also reports chest pain and pain in left shoulder.

## 2012-11-07 NOTE — ED Provider Notes (Signed)
History    CSN: [id number] Arrival date & time 11/07/12  1632 First MD Initiated Contact with Patient 11/07/12 1710     Cc: chest pain  HPI Pt has been having chest pain for at least the last month.  THe pain is sharp pain in the chest and will radiate to the left shoulder, left jaw and the arm.  The pain will come and go.  Often in the am she will wake up with the pain in her arm.  She also notices swelling in her legs intermittently.  The pain occurs every day.  It usually lasts seconds at a time.  The jaw pain may last for about a minute. Her SOB increases with walking.   She was admitted to the hospital last month and had a stress test that was normal.  She saw her doctor today and was sent back to the ED.  Pt continues to smoke.  She has been taking her meds for acid reflux.  Past Medical History  Diagnosis Date  . COPD (chronic obstructive pulmonary disease) 2000  . Carpal tunnel syndrome   . Diverticulitis 2008  . Pancreatitis 10/2011  . Schizophrenia   . RLS (restless legs syndrome)   . GERD (gastroesophageal reflux disease) 2008  . Bipolar 1 disorder   . Small bowel obstruction 2008  . Cervical cancer 1985  . History of colon polyps 2009  . Anemia 2008  . Hypertension 2013  . Headache   . Anxiety   . Asthma   . Arthritis 2000  . Depression 2000  . HLD (hyperlipidemia) 2013  . IBS (irritable bowel syndrome) 2008  . Shortness of breath     Past Surgical History  Procedure Date  . Colon surgery 2008    6 inches of colon removed due to obstruction  . Partial hysterectomy 1995  . Total abdominal hysterectomy 1996    Family History  Problem Relation Age of Onset  . Other Mother     many bowel obstructions  . Kidney cancer Father   . Bone cancer Father   . Diabetes Father   . Diabetes Daughter   . Heart disease Mother   . Heart disease Father     History  Substance Use Topics  . Smoking status: Current Every Day Smoker -- 1.5 packs/day for 39 years   Types: Cigarettes  . Smokeless tobacco: Never Used  . Alcohol Use: No    OB History    Grav Para Term Preterm Abortions TAB SAB Ect Mult Living                  Review of Systems  All other systems reviewed and are negative.    Allergies  Doxycycline; Aspirin; Hydrocodone-acetaminophen; and Iohexol  Home Medications   Current Outpatient Rx  Name  Route  Sig  Dispense  Refill  . ALBUTEROL SULFATE HFA 108 (90 BASE) MCG/ACT IN AERS   Inhalation   Inhale 2 puffs into the lungs every 6 (six) hours as needed. Asthma         . LUBRICANT EYE OP   Ophthalmic   Apply 2 drops to eye every morning.         Marland Kitchen FLUTICASONE-SALMETEROL 250-50 MCG/DOSE IN AEPB   Inhalation   Inhale 1 puff into the lungs every 12 (twelve) hours.         Marland Kitchen LISINOPRIL 20 MG PO TABS   Oral   Take 20 mg by mouth daily.         Marland Kitchen  OXYCODONE-ACETAMINOPHEN 5-325 MG PO TABS   Oral   Take 1 tablet by mouth 4 (four) times daily as needed. For pain         . PANTOPRAZOLE SODIUM 20 MG PO TBEC   Oral   Take 20 mg by mouth daily.           BP 137/70  Pulse 101  Temp 98 F (36.7 C)  Resp 20  SpO2 99%  Physical Exam  Nursing note and vitals reviewed. Constitutional: She appears well-developed and well-nourished. No distress.  HENT:  Head: Normocephalic and atraumatic.  Right Ear: External ear normal.  Left Ear: External ear normal.  Eyes: Conjunctivae normal are normal. Right eye exhibits no discharge. Left eye exhibits no discharge. No scleral icterus.  Neck: Neck supple. No tracheal deviation present.  Cardiovascular: Normal rate, regular rhythm and intact distal pulses.   Pulmonary/Chest: Effort normal and breath sounds normal. No stridor. No respiratory distress. She has no wheezes. She has no rales.  Abdominal: Soft. Bowel sounds are normal. She exhibits no distension. There is no tenderness. There is no rebound and no guarding.  Musculoskeletal: She exhibits no edema and no  tenderness.  Neurological: She is alert. She has normal strength. No sensory deficit. Cranial nerve deficit:  no gross defecits noted. She exhibits normal muscle tone. She displays no seizure activity. Coordination normal.  Skin: Skin is warm and dry. No rash noted. She is not diaphoretic.  Psychiatric: She has a normal mood and affect.    ED Course  Procedures (including critical care time)  Labs Reviewed  CBC - Abnormal; Notable for the following:    RBC 3.81 (*)     All other components within normal limits  BASIC METABOLIC PANEL - Abnormal; Notable for the following:    GFR calc non Af Amer 70 (*)     GFR calc Af Amer 82 (*)     All other components within normal limits  POCT I-STAT TROPONIN I   Dg Chest 2 View  11/07/2012  *RADIOLOGY REPORT*  Clinical Data: COPD  CHEST - 2 VIEW  Comparison: Chest radiograph 10/07/2012  Findings: Normal mediastinum and cardiac silhouette.  Normal pulmonary  vasculature.  No evidence of effusion, infiltrate, or pneumothorax.  No acute bony abnormality.  IMPRESSION: No acute cardiopulmonary process.   Original Report Authenticated By: Suzy Bouchard, M.D.      Diagnosis: Chest pain   MDM  [name] EKG is unremarkable. Her chest x-ray and laboratory tests are unremarkable. The patient had inpatient hospitalization for this chest pain last month. She had a normal stress echocardiogram. She was seen by [facility] cardiology who felt that she had atypical chest pain. The patient was seen by her doctor and sent back to the emergency room. Her symptoms are atypical. The patient is not wheezing. I doubt she is having any significant COPD exacerbation. I doubt pulmonary embolism.  She does not have any risk factors and does not have any leg swelling on exam.  She is not tachycardic during my evaluation  Considering her recent evaluation that was normal and do not feel that she requires inpatient hospitalization. I will refer her to [facility] cardiology to  discuss whether they feel that any further intervention is necessary. They saw her while she was in the emergency department.        Kathalene Frames, MD 11/07/12 2030

## 2013-05-12 ENCOUNTER — Encounter (HOSPITAL_COMMUNITY): Payer: Self-pay | Admitting: Emergency Medicine

## 2013-05-12 ENCOUNTER — Emergency Department (HOSPITAL_COMMUNITY)
Admission: EM | Admit: 2013-05-12 | Discharge: 2013-05-12 | Disposition: A | Discharge status: Home / Self Care | Payer: Medicaid Other | Attending: Emergency Medicine | Admitting: Emergency Medicine

## 2013-05-12 DIAGNOSIS — F19939 Other psychoactive substance use, unspecified with withdrawal, unspecified: Secondary | ICD-10-CM | POA: Insufficient documentation

## 2013-05-12 DIAGNOSIS — R110 Nausea: Secondary | ICD-10-CM

## 2013-05-12 DIAGNOSIS — F17200 Nicotine dependence, unspecified, uncomplicated: Secondary | ICD-10-CM | POA: Insufficient documentation

## 2013-05-12 DIAGNOSIS — F319 Bipolar disorder, unspecified: Secondary | ICD-10-CM | POA: Insufficient documentation

## 2013-05-12 DIAGNOSIS — K219 Gastro-esophageal reflux disease without esophagitis: Secondary | ICD-10-CM | POA: Insufficient documentation

## 2013-05-12 DIAGNOSIS — Z8659 Personal history of other mental and behavioral disorders: Secondary | ICD-10-CM | POA: Insufficient documentation

## 2013-05-12 DIAGNOSIS — Z860100 Personal history of colon polyps, unspecified: Secondary | ICD-10-CM | POA: Insufficient documentation

## 2013-05-12 DIAGNOSIS — J441 Chronic obstructive pulmonary disease with (acute) exacerbation: Secondary | ICD-10-CM | POA: Insufficient documentation

## 2013-05-12 DIAGNOSIS — M129 Arthropathy, unspecified: Secondary | ICD-10-CM | POA: Insufficient documentation

## 2013-05-12 DIAGNOSIS — Z8719 Personal history of other diseases of the digestive system: Secondary | ICD-10-CM | POA: Insufficient documentation

## 2013-05-12 DIAGNOSIS — G479 Sleep disorder, unspecified: Secondary | ICD-10-CM | POA: Insufficient documentation

## 2013-05-12 DIAGNOSIS — Z8639 Personal history of other endocrine, nutritional and metabolic disease: Secondary | ICD-10-CM | POA: Insufficient documentation

## 2013-05-12 DIAGNOSIS — F411 Generalized anxiety disorder: Secondary | ICD-10-CM | POA: Insufficient documentation

## 2013-05-12 DIAGNOSIS — Z8541 Personal history of malignant neoplasm of cervix uteri: Secondary | ICD-10-CM | POA: Insufficient documentation

## 2013-05-12 DIAGNOSIS — Z760 Encounter for issue of repeat prescription: Secondary | ICD-10-CM | POA: Insufficient documentation

## 2013-05-12 DIAGNOSIS — Z79899 Other long term (current) drug therapy: Secondary | ICD-10-CM | POA: Insufficient documentation

## 2013-05-12 DIAGNOSIS — Z8669 Personal history of other diseases of the nervous system and sense organs: Secondary | ICD-10-CM | POA: Insufficient documentation

## 2013-05-12 DIAGNOSIS — Z862 Personal history of diseases of the blood and blood-forming organs and certain disorders involving the immune mechanism: Secondary | ICD-10-CM | POA: Insufficient documentation

## 2013-05-12 DIAGNOSIS — R450 Nervousness: Secondary | ICD-10-CM | POA: Insufficient documentation

## 2013-05-12 DIAGNOSIS — R443 Hallucinations, unspecified: Secondary | ICD-10-CM | POA: Insufficient documentation

## 2013-05-12 DIAGNOSIS — R112 Nausea with vomiting, unspecified: Secondary | ICD-10-CM | POA: Insufficient documentation

## 2013-05-12 DIAGNOSIS — I10 Essential (primary) hypertension: Secondary | ICD-10-CM | POA: Insufficient documentation

## 2013-05-12 LAB — CBC
HCT: 40.9 % (ref 36.0–46.0)
Hemoglobin: 14.4 g/dL (ref 12.0–15.0)
MCV: 93 fL (ref 78.0–100.0)
RBC: 4.4 MIL/uL (ref 3.87–5.11)
WBC: 7.6 10*3/uL (ref 4.0–10.5)

## 2013-05-12 LAB — RAPID URINE DRUG SCREEN, HOSP PERFORMED
Amphetamines: NOT DETECTED
Barbiturates: NOT DETECTED
Benzodiazepines: NOT DETECTED

## 2013-05-12 LAB — ACETAMINOPHEN LEVEL: Acetaminophen (Tylenol), Serum: <15 ug/mL (ref 10–30)

## 2013-05-12 LAB — COMPREHENSIVE METABOLIC PANEL
Alkaline Phosphatase: 90 U/L (ref 39–117)
BUN: 10 mg/dL (ref 6–23)
CO2: 22 mEq/L (ref 19–32)
Chloride: 104 mEq/L (ref 96–112)
Creatinine, Ser: 0.87 mg/dL (ref 0.50–1.10)
GFR calc non Af Amer: 76 mL/min — ABNORMAL LOW (ref >90)
Potassium: 4.5 mEq/L (ref 3.5–5.1)
Total Bilirubin: 0.4 mg/dL (ref 0.3–1.2)

## 2013-05-12 LAB — SALICYLATE LEVEL: Salicylate Lvl: <2 mg/dL — ABNORMAL LOW (ref 2.8–20.0)

## 2013-05-12 LAB — ETHANOL: Alcohol, Ethyl (B): <11 mg/dL (ref 0–11)

## 2013-05-12 MED ORDER — LORAZEPAM 1 MG PO TABS
1.0000 mg | ORAL_TABLET | Freq: Once | ORAL | Status: AC
  Administered 2013-05-12: 1 mg via ORAL
  Filled 2013-05-12: qty 1

## 2013-05-12 MED ORDER — VENLAFAXINE HCL ER 75 MG PO CP24: 75.0000 mg | ORAL_CAPSULE | Freq: Every day | ORAL | Status: DC

## 2013-05-12 MED ORDER — ALPRAZOLAM 0.5 MG PO TABS: 0.5000 mg | ORAL_TABLET | Freq: Every evening | ORAL | Status: DC | PRN

## 2013-05-12 MED ORDER — VENLAFAXINE HCL 75 MG PO TABS
75.0000 mg | ORAL_TABLET | ORAL | Status: AC
  Administered 2013-05-12: 75 mg via ORAL
  Filled 2013-05-12: qty 1

## 2013-05-12 NOTE — ED Provider Notes (Signed)
CSN: [id number]     Arrival date & time 05/12/13  1253 History     First MD Initiated Contact with Patient 05/12/13 1406     Chief Complaint  Patient presents with  . Medical Clearance  . Suicidal  . Medication Refill   (Consider location/radiation/quality/duration/timing/severity/associated sxs/prior Treatment) HPI Comments: 52 yo female with anxiety, depression, htn IBS hx presents with withdrawal sxs.  Since being off of effexor on Friday patient has had worsening nausea and feels like she is going to come out of her skin.  Flu like sxs.  Pt has a few episodes of suicidal ideation to slit her wrist but she feels improved and has no plan or similar ideation.  She just wants back on her effexor that she was doing well with.  She denies any specific reason or rx for stopping them.  She was sent to psychologist who does not prescribe and does not have reliable psychiatrist.    The history is provided by the patient.    Past Medical History  Diagnosis Date  . COPD (chronic obstructive pulmonary disease) 2000  . Carpal tunnel syndrome   . Diverticulitis 2008  . Pancreatitis 10/2011  . Schizophrenia   . RLS (restless legs syndrome)   . GERD (gastroesophageal reflux disease) 2008  . Bipolar 1 disorder   . Small bowel obstruction 2008  . Cervical cancer 1985  . History of colon polyps 2009  . Anemia 2008  . Hypertension 2013  . Headache(784.0)   . Anxiety   . Asthma   . Arthritis 2000  . Depression 2000  . HLD (hyperlipidemia) 2013  . IBS (irritable bowel syndrome) 2008  . Shortness of breath    Past Surgical History  Procedure Laterality Date  . Colon surgery  2008    6 inches of colon removed due to obstruction  . Partial hysterectomy  1995  . Total abdominal hysterectomy  1996   Family History  Problem Relation Age of Onset  . Other Mother     many bowel obstructions  . Kidney cancer Father   . Bone cancer Father   . Diabetes Father   . Diabetes Daughter   . Heart  disease Mother   . Heart disease Father    History  Substance Use Topics  . Smoking status: Current Every Day Smoker -- 1.50 packs/day for 39 years    Types: Cigarettes  . Smokeless tobacco: Never Used  . Alcohol Use: No   OB History   Grav Para Term Preterm Abortions TAB SAB Ect Mult Living                 Review of Systems  Constitutional: Positive for appetite change. Negative for fever and chills.  HENT: Negative for neck pain and neck stiffness.   Eyes: Negative for visual disturbance.  Respiratory: Negative for shortness of breath.   Cardiovascular: Negative for chest pain.  Gastrointestinal: Positive for nausea and vomiting. Negative for abdominal pain.  Genitourinary: Negative for dysuria and flank pain.  Musculoskeletal: Negative for back pain.  Skin: Negative for rash.  Neurological: Negative for light-headedness and headaches.  Psychiatric/Behavioral: Positive for suicidal ideas, hallucinations (auditory) and sleep disturbance. Negative for self-injury. The patient is nervous/anxious.     Allergies  Doxycycline; Aspirin; Hydrocodone-acetaminophen; and Iohexol  Home Medications   Current Outpatient Rx  Name  Route  Sig  Dispense  Refill  . albuterol (PROVENTIL HFA;VENTOLIN HFA) 108 (90 BASE) MCG/ACT inhaler   Inhalation   Inhale  2 puffs into the lungs 2 (two) times daily as needed (asthma).          . Artificial Tear Ointment (LUBRICANT EYE OP)   Ophthalmic   Apply 2 drops to eye daily as needed (dry eyes).          . Fluticasone-Salmeterol (ADVAIR) 250-50 MCG/DOSE AEPB   Inhalation   Inhale 1 puff into the lungs every 12 (twelve) hours.         Marland Kitchen ibuprofen (ADVIL,MOTRIN) 200 MG tablet   Oral   Take 800 mg by mouth 2 (two) times daily as needed for pain.         Marland Kitchen latanoprost (XALATAN) 0.005 % ophthalmic solution   Both Eyes   Place 1 drop into both eyes at bedtime.         Marland Kitchen lisinopril (PRINIVIL,ZESTRIL) 20 MG tablet   Oral   Take 20 mg  by mouth daily.         . pantoprazole (PROTONIX) 40 MG tablet   Oral   Take 40 mg by mouth daily.         Marland Kitchen PRESCRIPTION MEDICATION   Right Eye   Place 1 application into the right eye at bedtime. Eye ointment from Dr. Herbert Deaner for pain         . ALPRAZolam (XANAX) 0.5 MG tablet   Oral   Take 1 tablet (0.5 mg total) by mouth at bedtime as needed for sleep.   6 tablet   0   . prednisoLONE acetate (PRED FORTE) 1 % ophthalmic suspension   Both Eyes   Place 1 drop into both eyes See admin instructions. 1 drop every 2 hours as directed, then 4 times daily         . venlafaxine XR (EFFEXOR XR) 75 MG 24 hr capsule   Oral   Take 1 capsule (75 mg total) by mouth daily.   30 capsule   0    BP 142/72  Pulse 68  Temp(Src) 98 F (36.7 C) (Oral)  Resp 18  SpO2 99% Physical Exam  Nursing note and vitals reviewed. Constitutional: She is oriented to person, place, and time. She appears well-developed and well-nourished.  HENT:  Head: Normocephalic and atraumatic.  Eyes: Conjunctivae are normal. Right eye exhibits no discharge. Left eye exhibits no discharge.  Neck: Normal range of motion. Neck supple. No tracheal deviation present.  Cardiovascular: Normal rate and regular rhythm.   Pulmonary/Chest: Effort normal and breath sounds normal.  Abdominal: Soft. She exhibits no distension. There is no tenderness. There is no guarding.  Musculoskeletal: She exhibits no edema.  Neurological: She is alert and oriented to person, place, and time. No cranial nerve deficit.  Skin: Skin is warm. No rash noted.  Psychiatric: Her mood appears anxious. Her speech is not rapid and/or pressured and not delayed.  Mild anxious Normal speech.  Pt not currently suicidal. Pt more angry about not being able to refill script than anything.  Denies HI    ED Course   Procedures (including critical care time)  Labs Reviewed  CBC - Abnormal; Notable for the following:    Platelets 403 (*)    All  other components within normal limits  COMPREHENSIVE METABOLIC PANEL - Abnormal; Notable for the following:    Glucose, Bld 102 (*)    GFR calc non Af Amer 76 (*)    GFR calc Af Amer 89 (*)    All other components within normal limits  SALICYLATE LEVEL -  Abnormal; Notable for the following:    Salicylate Lvl [id number] (*)    All other components within normal limits  ACETAMINOPHEN LEVEL  ETHANOL  URINE RAPID DRUG SCREEN (HOSP PERFORMED)   No results found. 1. Nausea   2. Medication withdrawal, uncomplicated     MDM  Close fup discussed. Ativan in ED.  Pt very low risk for suicide, she feels safe going home, she does not have any intent on hurting herself. Effexor in ED and script with strict fup discussed. Pt improved in ED.  DC  Mariea Clonts, MD 05/12/13 [phone]

## 2013-05-12 NOTE — ED Notes (Signed)
PT reports she needs help withdrawing on Effexor 75mg  which she takes for depression. Been taking for 6 mos. Pt was tearful upon arrival but seems appropriate at this time. Talks with sitter. Last dose of Effexor was Friday.

## 2013-05-12 NOTE — ED Notes (Signed)
Sprite and warm blankets given

## 2013-05-12 NOTE — ED Notes (Signed)
Pt here c/o that "her PCP wont refill her Effexor" and thinks she is in withdrawal; pt sts SI and is tearful; pt sts N/V and sts out of meds x 4 days; pt admits to visual and auditory hallucinations

## 2013-08-14 DIAGNOSIS — E8940 Asymptomatic postprocedural ovarian failure: Secondary | ICD-10-CM | POA: Insufficient documentation

## 2013-08-14 DIAGNOSIS — N952 Postmenopausal atrophic vaginitis: Secondary | ICD-10-CM | POA: Insufficient documentation

## 2014-04-23 ENCOUNTER — Other Ambulatory Visit: Payer: Self-pay | Admitting: Nephrology

## 2014-04-23 DIAGNOSIS — N183 Chronic kidney disease, stage 3 unspecified: Secondary | ICD-10-CM

## 2014-04-30 ENCOUNTER — Other Ambulatory Visit: Payer: Medicaid Other

## 2014-04-30 ENCOUNTER — Ambulatory Visit
Admission: RE | Admit: 2014-04-30 | Discharge: 2014-04-30 | Disposition: A | Discharge status: Home / Self Care | Payer: Medicaid Other | Source: Ambulatory Visit | Attending: Nephrology | Admitting: Nephrology

## 2014-04-30 ENCOUNTER — Other Ambulatory Visit: Payer: Self-pay | Admitting: Nephrology

## 2014-04-30 DIAGNOSIS — N183 Chronic kidney disease, stage 3 unspecified: Secondary | ICD-10-CM

## 2014-04-30 DIAGNOSIS — I10 Essential (primary) hypertension: Secondary | ICD-10-CM

## 2014-04-30 DIAGNOSIS — N2581 Secondary hyperparathyroidism of renal origin: Secondary | ICD-10-CM | POA: Insufficient documentation

## 2014-05-06 ENCOUNTER — Other Ambulatory Visit: Payer: Self-pay | Admitting: Nephrology

## 2014-05-06 DIAGNOSIS — I10 Essential (primary) hypertension: Secondary | ICD-10-CM

## 2014-05-13 ENCOUNTER — Other Ambulatory Visit: Payer: Medicaid Other

## 2014-06-25 ENCOUNTER — Emergency Department: Payer: Self-pay | Admitting: Emergency Medicine

## 2014-06-25 LAB — CBC
HCT: 27.7 % — ABNORMAL LOW (ref 35.0–47.0)
HGB: 8.8 g/dL — ABNORMAL LOW (ref 12.0–16.0)
MCH: 28.3 pg (ref 26.0–34.0)
MCHC: 31.8 g/dL — ABNORMAL LOW (ref 32.0–36.0)
MCV: 89 fL (ref 80–100)
PLATELETS: 608 10*3/uL — AB (ref 150–440)
RBC: 3.11 10*6/uL — ABNORMAL LOW (ref 3.80–5.20)
RDW: 16.8 % — ABNORMAL HIGH (ref 11.5–14.5)
WBC: 9.8 10*3/uL (ref 3.6–11.0)

## 2014-06-25 LAB — COMPREHENSIVE METABOLIC PANEL
ALT: 14 U/L
Albumin: 2.8 g/dL — ABNORMAL LOW (ref 3.4–5.0)
Alkaline Phosphatase: 102 U/L
Anion Gap: 4 — ABNORMAL LOW (ref 7–16)
BUN: 14 mg/dL (ref 7–18)
Bilirubin,Total: 0.2 mg/dL (ref 0.2–1.0)
CHLORIDE: 102 mmol/L (ref 98–107)
Calcium, Total: 9.1 mg/dL (ref 8.5–10.1)
Co2: 28 mmol/L (ref 21–32)
Creatinine: 1.23 mg/dL (ref 0.60–1.30)
EGFR (African American): 59 — ABNORMAL LOW
GFR CALC NON AF AMER: 49 — AB
Glucose: 90 mg/dL (ref 65–99)
Osmolality: 268 (ref 275–301)
Potassium: 4.8 mmol/L (ref 3.5–5.1)
SGOT(AST): 21 U/L (ref 15–37)
Sodium: 134 mmol/L — ABNORMAL LOW (ref 136–145)
TOTAL PROTEIN: 7.5 g/dL (ref 6.4–8.2)

## 2014-06-25 LAB — TROPONIN I

## 2014-06-28 ENCOUNTER — Encounter (HOSPITAL_COMMUNITY): Payer: Self-pay | Admitting: Emergency Medicine

## 2014-06-28 ENCOUNTER — Emergency Department (HOSPITAL_COMMUNITY): Payer: Medicaid Other

## 2014-06-28 ENCOUNTER — Emergency Department (HOSPITAL_COMMUNITY)
Admission: EM | Admit: 2014-06-28 | Discharge: 2014-06-28 | Disposition: A | Discharge status: Home / Self Care | Payer: Medicaid Other | Attending: Emergency Medicine | Admitting: Emergency Medicine

## 2014-06-28 DIAGNOSIS — K219 Gastro-esophageal reflux disease without esophagitis: Secondary | ICD-10-CM | POA: Insufficient documentation

## 2014-06-28 DIAGNOSIS — Z8739 Personal history of other diseases of the musculoskeletal system and connective tissue: Secondary | ICD-10-CM | POA: Diagnosis not present

## 2014-06-28 DIAGNOSIS — F209 Schizophrenia, unspecified: Secondary | ICD-10-CM | POA: Diagnosis not present

## 2014-06-28 DIAGNOSIS — I10 Essential (primary) hypertension: Secondary | ICD-10-CM | POA: Diagnosis not present

## 2014-06-28 DIAGNOSIS — Z8669 Personal history of other diseases of the nervous system and sense organs: Secondary | ICD-10-CM | POA: Insufficient documentation

## 2014-06-28 DIAGNOSIS — Z862 Personal history of diseases of the blood and blood-forming organs and certain disorders involving the immune mechanism: Secondary | ICD-10-CM | POA: Insufficient documentation

## 2014-06-28 DIAGNOSIS — Z8541 Personal history of malignant neoplasm of cervix uteri: Secondary | ICD-10-CM | POA: Diagnosis not present

## 2014-06-28 DIAGNOSIS — Z79899 Other long term (current) drug therapy: Secondary | ICD-10-CM | POA: Diagnosis not present

## 2014-06-28 DIAGNOSIS — J45901 Unspecified asthma with (acute) exacerbation: Secondary | ICD-10-CM

## 2014-06-28 DIAGNOSIS — F319 Bipolar disorder, unspecified: Secondary | ICD-10-CM | POA: Insufficient documentation

## 2014-06-28 DIAGNOSIS — J189 Pneumonia, unspecified organism: Secondary | ICD-10-CM

## 2014-06-28 DIAGNOSIS — Z860100 Personal history of colon polyps, unspecified: Secondary | ICD-10-CM | POA: Insufficient documentation

## 2014-06-28 DIAGNOSIS — F17200 Nicotine dependence, unspecified, uncomplicated: Secondary | ICD-10-CM | POA: Diagnosis not present

## 2014-06-28 DIAGNOSIS — J441 Chronic obstructive pulmonary disease with (acute) exacerbation: Secondary | ICD-10-CM | POA: Insufficient documentation

## 2014-06-28 DIAGNOSIS — J159 Unspecified bacterial pneumonia: Secondary | ICD-10-CM | POA: Insufficient documentation

## 2014-06-28 DIAGNOSIS — Z87448 Personal history of other diseases of urinary system: Secondary | ICD-10-CM | POA: Insufficient documentation

## 2014-06-28 DIAGNOSIS — R079 Chest pain, unspecified: Secondary | ICD-10-CM | POA: Insufficient documentation

## 2014-06-28 DIAGNOSIS — Z792 Long term (current) use of antibiotics: Secondary | ICD-10-CM | POA: Insufficient documentation

## 2014-06-28 DIAGNOSIS — IMO0002 Reserved for concepts with insufficient information to code with codable children: Secondary | ICD-10-CM | POA: Diagnosis not present

## 2014-06-28 DIAGNOSIS — F411 Generalized anxiety disorder: Secondary | ICD-10-CM | POA: Diagnosis not present

## 2014-06-28 DIAGNOSIS — Z8639 Personal history of other endocrine, nutritional and metabolic disease: Secondary | ICD-10-CM | POA: Insufficient documentation

## 2014-06-28 LAB — URINALYSIS, ROUTINE W REFLEX MICROSCOPIC
BILIRUBIN URINE: NEGATIVE
Glucose, UA: NEGATIVE mg/dL
Hgb urine dipstick: NEGATIVE
KETONES UR: NEGATIVE mg/dL
LEUKOCYTES UA: NEGATIVE
Nitrite: NEGATIVE
PH: 6 (ref 5.0–8.0)
PROTEIN: NEGATIVE mg/dL
Specific Gravity, Urine: 1.012 (ref 1.005–1.030)
Urobilinogen, UA: 0.2 mg/dL (ref 0.0–1.0)

## 2014-06-28 LAB — COMPREHENSIVE METABOLIC PANEL
ALT: 15 U/L (ref 0–35)
ANION GAP: 11 (ref 5–15)
AST: 19 U/L (ref 0–37)
Albumin: 3.2 g/dL — ABNORMAL LOW (ref 3.5–5.2)
Alkaline Phosphatase: 99 U/L (ref 39–117)
BUN: 23 mg/dL (ref 6–23)
CO2: 28 meq/L (ref 19–32)
Calcium: 9.6 mg/dL (ref 8.4–10.5)
Chloride: 95 mEq/L — ABNORMAL LOW (ref 96–112)
Creatinine, Ser: 1.57 mg/dL — ABNORMAL HIGH (ref 0.50–1.10)
GFR, EST AFRICAN AMERICAN: 43 mL/min — AB (ref >90)
GFR, EST NON AFRICAN AMERICAN: 37 mL/min — AB (ref >90)
GLUCOSE: 80 mg/dL (ref 70–99)
Potassium: 5.2 mEq/L (ref 3.7–5.3)
Sodium: 134 mEq/L — ABNORMAL LOW (ref 137–147)
TOTAL PROTEIN: 7.2 g/dL (ref 6.0–8.3)
Total Bilirubin: <0.2 mg/dL — ABNORMAL LOW (ref 0.3–1.2)

## 2014-06-28 LAB — I-STAT TROPONIN, ED: Troponin i, poc: 0 ng/mL (ref 0.00–0.08)

## 2014-06-28 LAB — CBC
HEMATOCRIT: 28.4 % — AB (ref 36.0–46.0)
HEMOGLOBIN: 9 g/dL — AB (ref 12.0–15.0)
MCH: 27.7 pg (ref 26.0–34.0)
MCHC: 31.7 g/dL (ref 30.0–36.0)
MCV: 87.4 fL (ref 78.0–100.0)
Platelets: 727 10*3/uL — ABNORMAL HIGH (ref 150–400)
RBC: 3.25 MIL/uL — AB (ref 3.87–5.11)
RDW: 16.3 % — ABNORMAL HIGH (ref 11.5–15.5)
WBC: 12.8 10*3/uL — ABNORMAL HIGH (ref 4.0–10.5)

## 2014-06-28 MED ORDER — METHYLPREDNISOLONE SODIUM SUCC 125 MG IJ SOLR
125.0000 mg | Freq: Once | INTRAMUSCULAR | Status: AC
  Administered 2014-06-28: 125 mg via INTRAVENOUS
  Filled 2014-06-28: qty 2

## 2014-06-28 MED ORDER — GUAIFENESIN-CODEINE 100-10 MG/5ML PO SOLN: 10.0000 mL | Freq: Once | ORAL | Status: DC

## 2014-06-28 MED ORDER — AZITHROMYCIN 500 MG IV SOLR
500.0000 mg | Freq: Once | INTRAVENOUS | Status: AC
  Administered 2014-06-28: 500 mg via INTRAVENOUS
  Filled 2014-06-28 (×2): qty 500

## 2014-06-28 MED ORDER — DEXTROSE 5 % IV SOLN
1.0000 g | Freq: Once | INTRAVENOUS | Status: AC
  Administered 2014-06-28: 1 g via INTRAVENOUS
  Filled 2014-06-28: qty 10

## 2014-06-28 MED ORDER — GUAIFENESIN-CODEINE 100-10 MG/5ML PO SOLN: 10.0000 mL | Freq: Three times a day (TID) | ORAL | Status: DC | PRN

## 2014-06-28 MED ORDER — GUAIFENESIN 100 MG/5ML PO SOLN
5.0000 mL | Freq: Once | ORAL | Status: AC
  Administered 2014-06-28: 100 mg via ORAL
  Filled 2014-06-28: qty 5

## 2014-06-28 MED ORDER — PREDNISONE 20 MG PO TABS: 40.0000 mg | ORAL_TABLET | Freq: Every day | ORAL | Status: DC

## 2014-06-28 MED ORDER — IPRATROPIUM-ALBUTEROL 0.5-2.5 (3) MG/3ML IN SOLN
3.0000 mL | Freq: Once | RESPIRATORY_TRACT | Status: AC
  Administered 2014-06-28: 3 mL via RESPIRATORY_TRACT
  Filled 2014-06-28: qty 3

## 2014-06-28 MED ORDER — ALBUTEROL SULFATE HFA 108 (90 BASE) MCG/ACT IN AERS: 2.0000 | INHALATION_SPRAY | Freq: Four times a day (QID) | RESPIRATORY_TRACT | Status: DC | PRN

## 2014-06-28 NOTE — ED Notes (Signed)
Got dx w/ pneu  Both lungs at [facility] on Friday, yesterday  Started to have  Cp yesterday sharp under left breast  Pt cont to smoke  Has had some sob

## 2014-06-28 NOTE — ED Provider Notes (Signed)
CSN: [id number]     Arrival date & time 06/28/14  1500 History   First MD Initiated Contact with Patient 06/28/14 1702     Chief Complaint  Patient presents with  . Chest Pain     (Consider location/radiation/quality/duration/timing/severity/associated sxs/prior Treatment) HPI Comments: Patient is a 53 yo F PMHx significant for COPD, HTN, HLD, schizophrenia, presented to emergency department for continued chest pain, cough, wheezing. Patient states she's to [facility] on Friday and diagnosed with bilateral pneumonia and sent home on Levaquin. She said she is unable to take Levaquin due to her myasthenia gravis. She states the pharmacist gave her a Z-Pak and sent home. She states her symptoms have worsened despite taking the antibiotic. She endorses associated subjective fevers and chills along with nausea. No home O2 use.  Patient is a 53 y.o. female presenting with chest pain.  Chest Pain Associated symptoms: cough, fever (subjective) and nausea   Associated symptoms: no abdominal pain and not vomiting     Past Medical History  Diagnosis Date  . COPD (chronic obstructive pulmonary disease) 2000  . Carpal tunnel syndrome   . Diverticulitis 2008  . Pancreatitis 10/2011  . Schizophrenia   . RLS (restless legs syndrome)   . GERD (gastroesophageal reflux disease) 2008  . Bipolar 1 disorder   . Small bowel obstruction 2008  . Cervical cancer 1985  . History of colon polyps 2009  . Anemia 2008  . Hypertension 2013  . Headache(784.0)   . Anxiety   . Asthma   . Arthritis 2000  . Depression 2000  . HLD (hyperlipidemia) 2013  . IBS (irritable bowel syndrome) 2008  . Shortness of breath   . Renal disorder    Past Surgical History  Procedure Laterality Date  . Colon surgery  2008    6 inches of colon removed due to obstruction  . Partial hysterectomy  1995  . Total abdominal hysterectomy  1996   Family History  Problem Relation Age of Onset  . Other Mother     many  bowel obstructions  . Kidney cancer Father   . Bone cancer Father   . Diabetes Father   . Diabetes Daughter   . Heart disease Mother   . Heart disease Father    History  Substance Use Topics  . Smoking status: Current Every Day Smoker -- 1.50 packs/day for 39 years    Types: Cigarettes  . Smokeless tobacco: Never Used  . Alcohol Use: No   OB History   Grav Para Term Preterm Abortions TAB SAB Ect Mult Living                 Review of Systems  Constitutional: Positive for fever (subjective) and chills.  Respiratory: Positive for cough, chest tightness and wheezing.   Cardiovascular: Positive for chest pain.  Gastrointestinal: Positive for nausea. Negative for vomiting and abdominal pain.  All other systems reviewed and are negative.     Allergies  Doxycycline; Aspirin; Hydrocodone-acetaminophen; and Iohexol  Home Medications   Prior to Admission medications   Medication Sig Start Date End Date Taking? Authorizing Provider  albuterol (PROVENTIL HFA;VENTOLIN HFA) 108 (90 BASE) MCG/ACT inhaler Inhale 2 puffs into the lungs 2 (two) times daily as needed (asthma).    Yes Historical Provider, MD  ALPRAZolam Duanne Moron) 0.5 MG tablet Take 1 tablet (0.5 mg total) by mouth at bedtime as needed for sleep. 05/12/13  Yes Mariea Clonts, MD  amLODipine (NORVASC) 10 MG tablet Take 10 mg  by mouth daily.   Yes Historical Provider, MD  azithromycin (ZITHROMAX) 250 MG tablet Take 250-500 mg by mouth daily. Take 2 tablets by mouth on day 1 then then 1 tablet every day for 4 days after. A total of 5 days of therapy   Yes Historical Provider, MD  cholecalciferol (VITAMIN D) 1000 UNITS tablet Take 1,000 Units by mouth daily.   Yes Historical Provider, MD  Fluticasone-Salmeterol (ADVAIR) 250-50 MCG/DOSE AEPB Inhale 1 puff into the lungs every 12 (twelve) hours.   Yes Historical Provider, MD  gabapentin (NEURONTIN) 400 MG capsule Take 400-1,200 mg by mouth 4 (four) times daily. Patient takes 400 mg 3  times daily and 1200 mg at bedtime   Yes Historical Provider, MD  hydrochlorothiazide (HYDRODIURIL) 25 MG tablet Take 25 mg by mouth daily.   Yes Historical Provider, MD  metoprolol (LOPRESSOR) 50 MG tablet Take 50 mg by mouth 2 (two) times daily.   Yes Historical Provider, MD  pantoprazole (PROTONIX) 40 MG tablet Take 40 mg by mouth daily.   Yes Historical Provider, MD  ranitidine (ZANTAC) 150 MG tablet Take 150 mg by mouth 2 (two) times daily.   Yes Historical Provider, MD  venlafaxine XR (EFFEXOR XR) 75 MG 24 hr capsule Take 1 capsule (75 mg total) by mouth daily. 05/12/13  Yes Mariea Clonts, MD  albuterol (PROVENTIL HFA;VENTOLIN HFA) 108 (90 BASE) MCG/ACT inhaler Inhale 2 puffs into the lungs every 6 (six) hours as needed for wheezing or shortness of breath. 06/28/14   Sandra Brents L Waddell Iten, PA-C  guaiFENesin-codeine 100-10 MG/5ML syrup Take 10 mLs by mouth 3 (three) times daily as needed for cough. 06/28/14   Willo Yoon L Huntley Knoop, PA-C  predniSONE (DELTASONE) 20 MG tablet Take 2 tablets (40 mg total) by mouth daily. 06/28/14   Sylvain Hasten L Sarvesh Meddaugh, PA-C   BP 158/99  Pulse 67  Temp(Src) 98.6 F (37 C) (Oral)  Resp 21  Wt 93 lb (42.185 kg)  SpO2 99% Physical Exam  Nursing note and vitals reviewed. Constitutional: She is oriented to person, place, and time. She appears well-developed and well-nourished. No distress.  HENT:  Head: Normocephalic and atraumatic.  Right Ear: External ear normal.  Left Ear: External ear normal.  Nose: Nose normal.  Mouth/Throat: Oropharynx is clear and moist. No oropharyngeal exudate.  Eyes: Conjunctivae are normal.  Neck: Normal range of motion. Neck supple.  Cardiovascular: Normal rate, regular rhythm and normal heart sounds.   Pulmonary/Chest: Effort normal. She has wheezes (diffuse inspiratory and wheezing).  Abdominal: Soft. There is no tenderness.  Musculoskeletal: Normal range of motion. She exhibits no edema.  Neurological: She is alert and  oriented to person, place, and time.  Skin: Skin is warm and dry. She is not diaphoretic.  Psychiatric: She has a normal mood and affect.    ED Course  Procedures (including critical care time) Medications  guaiFENesin-codeine 100-10 MG/5ML solution 10 mL (not administered)  ipratropium-albuterol (DUONEB) 0.5-2.5 (3) MG/3ML nebulizer solution 3 mL (3 mLs Nebulization Given 06/28/14 1825)  azithromycin (ZITHROMAX) 500 mg in dextrose 5 % 250 mL IVPB (0 mg Intravenous Stopped 06/28/14 2212)  cefTRIAXone (ROCEPHIN) 1 g in dextrose 5 % 50 mL IVPB (0 g Intravenous Stopped 06/28/14 2102)  ipratropium-albuterol (DUONEB) 0.5-2.5 (3) MG/3ML nebulizer solution 3 mL (3 mLs Nebulization Given 06/28/14 1940)  methylPREDNISolone sodium succinate (SOLU-MEDROL) 125 mg/2 mL injection 125 mg (125 mg Intravenous Given 06/28/14 2024)  guaiFENesin (ROBITUSSIN) 100 MG/5ML solution 100 mg (100 mg Oral Given 06/28/14  2026)    Labs Review Labs Reviewed  CBC - Abnormal; Notable for the following:    WBC 12.8 (*)    RBC 3.25 (*)    Hemoglobin 9.0 (*)    HCT 28.4 (*)    RDW 16.3 (*)    Platelets 727 (*)    All other components within normal limits  COMPREHENSIVE METABOLIC PANEL - Abnormal; Notable for the following:    Sodium 134 (*)    Chloride 95 (*)    Creatinine, Ser 1.57 (*)    Albumin 3.2 (*)    Total Bilirubin <0.2 (*)    GFR calc non Af Amer 37 (*)    GFR calc Af Amer 43 (*)    All other components within normal limits  URINALYSIS, ROUTINE W REFLEX MICROSCOPIC  I-STAT TROPOININ, ED    Imaging Review Dg Chest 2 View  06/28/2014   ADDENDUM REPORT: 06/28/2014 18:22  ADDENDUM: There is an old healed fracture of the mid right clavicle.   Electronically Signed   By: Lowella Grip M.D.   On: 06/28/2014 18:22   06/28/2014   CLINICAL DATA:  Chest pain  EXAM: CHEST  2 VIEW  COMPARISON:  November 07, 2012  FINDINGS: There is patchy infiltrate in the inferior lingula. There is mild atelectasis in the right  middle lobe. Elsewhere lungs are clear. The heart is slightly enlarged with pulmonary vascularity within normal limits. No adenopathy. No bone lesions.  IMPRESSION: Inferior lingular infiltrate. Atelectasis right middle lobe. Heart is mildly enlarged.  Electronically Signed: By: Lowella Grip M.D. On: 06/28/2014 18:20     EKG Interpretation None      8:14 PM mild expiratory wheeze. Patient endorses improvement in breathing and coughing. Discussed lab results and imaging results with patient.  MDM   Final diagnoses:  Community acquired pneumonia    Filed Vitals:   06/28/14 2135  BP: 158/99  Pulse: 67  Temp: 98.6 F (37 C)  Resp: 21   Afebrile, NAD, non-toxic appearing, AAOx4. I have reviewed nursing notes, vital signs, and all appropriate lab and imaging results for this patient.   Patient has been diagnosed with CAP via chest xray. Pt is not ill appearing, immunocompromised, therefore I feel like the they can be treated as an OP with abx therapy. Patient is able to ambulate without hypoxia. Cough and chest wall pain managed in the emergency department. Pt has been advised to return to the ED if symptoms worsen or they do not improve. Pt verbalizes understanding and is agreeable with plan. Patient is stable at time of discharge   Patient d/w with Dr. Tawnya Crook, agrees with plan.      Harlow Mares, PA-C 06/28/14 2239

## 2014-06-28 NOTE — ED Notes (Signed)
Ambulated pt around unit and pt remained between 97% and 100% on room air with a pulse rate of 67 bpm

## 2014-06-28 NOTE — ED Notes (Signed)
Pt ambulated in hallway on Pulse Ox. O2 Sats ranged between 91%-93%. Pt c/o SOB while ambulating.

## 2014-06-28 NOTE — Discharge Instructions (Signed)
Please follow up with your primary care physician in 1-2 days. If you do not have one please call the [facility] number listed above. Please finish your antibiotic course. Please use your inhaler 2 puffs every 4-6 hours to help with cough and chest tightness. Please take Robitussin with codeine as prescribed. His aorta this may make you drowsy do not drive with medication.Please read all discharge instructions and return precautions.    Pneumonia Pneumonia is an infection of the lungs.  CAUSES Pneumonia may be caused by bacteria or a virus. Usually, these infections are caused by breathing infectious particles into the lungs (respiratory tract). SIGNS AND SYMPTOMS   Cough.  Fever.  Chest pain.  Increased rate of breathing.  Wheezing.  Mucus production. DIAGNOSIS  If you have the common symptoms of pneumonia, your health care provider will typically confirm the diagnosis with a chest X-ray. The X-ray will show an abnormality in the lung (pulmonary infiltrate) if you have pneumonia. Other tests of your blood, urine, or sputum may be done to find the specific cause of your pneumonia. Your health care provider may also do tests (blood gases or pulse oximetry) to see how well your lungs are working. TREATMENT  Some forms of pneumonia may be spread to other people when you cough or sneeze. You may be asked to wear a mask before and during your exam. Pneumonia that is caused by bacteria is treated with antibiotic medicine. Pneumonia that is caused by the influenza virus may be treated with an antiviral medicine. Most other viral infections must run their course. These infections will not respond to antibiotics.  HOME CARE INSTRUCTIONS   Cough suppressants may be used if you are losing too much rest. However, coughing protects you by clearing your lungs. You should avoid using cough suppressants if you can.  Your health care provider may have prescribed medicine if he or she  thinks your pneumonia is caused by bacteria or influenza. Finish your medicine even if you start to feel better.  Your health care provider may also prescribe an expectorant. This loosens the mucus to be coughed up.  Take medicines only as directed by your health care provider.  Do not smoke. Smoking is a common cause of bronchitis and can contribute to pneumonia. If you are a smoker and continue to smoke, your cough may last several weeks after your pneumonia has cleared.  A cold steam vaporizer or humidifier in your room or home may help loosen mucus.  Coughing is often worse at night. Sleeping in a semi-upright position in a recliner or using a couple pillows under your head will help with this.  Get rest as you feel it is needed. Your body will usually let you know when you need to rest. PREVENTION A pneumococcal shot (vaccine) is available to prevent a common bacterial cause of pneumonia. This is usually suggested for:  People over 82 years old.  Patients on chemotherapy.  People with chronic lung problems, such as bronchitis or emphysema.  People with immune system problems. If you are over 65 or have a high risk condition, you may receive the pneumococcal vaccine if you have not received it before. In some countries, a routine influenza vaccine is also recommended. This vaccine can help prevent some cases of pneumonia.You may be offered the influenza vaccine as part of your care. If you smoke, it is time to quit. You may receive instructions on how to stop smoking. Your health care provider  can provide medicines and counseling to help you quit. SEEK MEDICAL CARE IF: You have a fever. SEEK IMMEDIATE MEDICAL CARE IF:   Your illness becomes worse. This is especially true if you are elderly or weakened from any other disease.  You cannot control your cough with suppressants and are losing sleep.  You begin coughing up blood.  You develop pain which is getting worse or is  uncontrolled with medicines.  Any of the symptoms which initially brought you in for treatment are getting worse rather than better.  You develop shortness of breath or chest pain. MAKE SURE YOU:   Understand these instructions.  Will watch your condition.  Will get help right away if you are not doing well or get worse. Document Released: 09/17/2005 Document Revised: 02/01/2014 Document Reviewed: 12/07/2010 [facility] Patient Information 2015 [place], [place]. This information is not intended to replace advice given to you by your health care provider. Make sure you discuss any questions you have with your health care provider.

## 2014-06-29 NOTE — ED Provider Notes (Signed)
Medical screening examination/treatment/procedure(s) were performed by non-physician practitioner and as supervising physician I was immediately available for consultation/collaboration.   EKG Interpretation   Date/Time:  Monday June 28 2014 15:05:31 EDT Ventricular Rate:  59 PR Interval:  118 QRS Duration: 82 QT Interval:  430 QTC Calculation: 425 R Axis:   75 Text Interpretation:  Sinus bradycardia Possible Left atrial enlargement  Left ventricular hypertrophy LVH more prominent of latest EKG Confirmed by  DOCHERTY  MD, MEGAN ([id number]) on 06/29/2014 12:05:39 AM        Ernestina Patches, MD 06/29/14 0005

## 2014-07-01 DIAGNOSIS — J189 Pneumonia, unspecified organism: Secondary | ICD-10-CM

## 2014-07-01 HISTORY — DX: Pneumonia, unspecified organism: J18.9

## 2014-07-02 ENCOUNTER — Inpatient Hospital Stay (HOSPITAL_COMMUNITY)
Admission: EM | Admit: 2014-07-02 | Discharge: 2014-07-04 | DRG: 291 | Disposition: A | Discharge status: Home / Self Care | Payer: Medicaid Other | Attending: Internal Medicine | Admitting: Internal Medicine

## 2014-07-02 ENCOUNTER — Emergency Department (HOSPITAL_COMMUNITY): Payer: Medicaid Other

## 2014-07-02 ENCOUNTER — Encounter (HOSPITAL_COMMUNITY): Payer: Self-pay | Admitting: Emergency Medicine

## 2014-07-02 DIAGNOSIS — R0902 Hypoxemia: Secondary | ICD-10-CM | POA: Diagnosis not present

## 2014-07-02 DIAGNOSIS — F17210 Nicotine dependence, cigarettes, uncomplicated: Secondary | ICD-10-CM | POA: Diagnosis present

## 2014-07-02 DIAGNOSIS — F319 Bipolar disorder, unspecified: Secondary | ICD-10-CM | POA: Diagnosis present

## 2014-07-02 DIAGNOSIS — E871 Hypo-osmolality and hyponatremia: Secondary | ICD-10-CM | POA: Diagnosis present

## 2014-07-02 DIAGNOSIS — I5030 Unspecified diastolic (congestive) heart failure: Secondary | ICD-10-CM

## 2014-07-02 DIAGNOSIS — N183 Chronic kidney disease, stage 3 unspecified: Secondary | ICD-10-CM

## 2014-07-02 DIAGNOSIS — K219 Gastro-esophageal reflux disease without esophagitis: Secondary | ICD-10-CM | POA: Diagnosis present

## 2014-07-02 DIAGNOSIS — I10 Essential (primary) hypertension: Secondary | ICD-10-CM

## 2014-07-02 DIAGNOSIS — Z7952 Long term (current) use of systemic steroids: Secondary | ICD-10-CM | POA: Diagnosis not present

## 2014-07-02 DIAGNOSIS — Z86010 Personal history of colonic polyps: Secondary | ICD-10-CM

## 2014-07-02 DIAGNOSIS — Z681 Body mass index (BMI) 19 or less, adult: Secondary | ICD-10-CM

## 2014-07-02 DIAGNOSIS — J45909 Unspecified asthma, uncomplicated: Secondary | ICD-10-CM | POA: Diagnosis present

## 2014-07-02 DIAGNOSIS — F419 Anxiety disorder, unspecified: Secondary | ICD-10-CM | POA: Diagnosis present

## 2014-07-02 DIAGNOSIS — J449 Chronic obstructive pulmonary disease, unspecified: Secondary | ICD-10-CM | POA: Diagnosis present

## 2014-07-02 DIAGNOSIS — R634 Abnormal weight loss: Secondary | ICD-10-CM | POA: Diagnosis present

## 2014-07-02 DIAGNOSIS — R195 Other fecal abnormalities: Secondary | ICD-10-CM | POA: Diagnosis present

## 2014-07-02 DIAGNOSIS — R0602 Shortness of breath: Secondary | ICD-10-CM | POA: Diagnosis present

## 2014-07-02 DIAGNOSIS — D638 Anemia in other chronic diseases classified elsewhere: Secondary | ICD-10-CM

## 2014-07-02 DIAGNOSIS — E46 Unspecified protein-calorie malnutrition: Secondary | ICD-10-CM

## 2014-07-02 DIAGNOSIS — I5031 Acute diastolic (congestive) heart failure: Principal | ICD-10-CM | POA: Diagnosis present

## 2014-07-02 DIAGNOSIS — J189 Pneumonia, unspecified organism: Secondary | ICD-10-CM | POA: Insufficient documentation

## 2014-07-02 DIAGNOSIS — I059 Rheumatic mitral valve disease, unspecified: Secondary | ICD-10-CM

## 2014-07-02 DIAGNOSIS — D509 Iron deficiency anemia, unspecified: Secondary | ICD-10-CM | POA: Diagnosis present

## 2014-07-02 DIAGNOSIS — Z9114 Patient's other noncompliance with medication regimen: Secondary | ICD-10-CM | POA: Diagnosis present

## 2014-07-02 DIAGNOSIS — F209 Schizophrenia, unspecified: Secondary | ICD-10-CM | POA: Diagnosis present

## 2014-07-02 DIAGNOSIS — I129 Hypertensive chronic kidney disease with stage 1 through stage 4 chronic kidney disease, or unspecified chronic kidney disease: Secondary | ICD-10-CM | POA: Diagnosis present

## 2014-07-02 DIAGNOSIS — J441 Chronic obstructive pulmonary disease with (acute) exacerbation: Secondary | ICD-10-CM | POA: Diagnosis present

## 2014-07-02 DIAGNOSIS — Z7901 Long term (current) use of anticoagulants: Secondary | ICD-10-CM

## 2014-07-02 DIAGNOSIS — E43 Unspecified severe protein-calorie malnutrition: Secondary | ICD-10-CM | POA: Diagnosis present

## 2014-07-02 DIAGNOSIS — I509 Heart failure, unspecified: Secondary | ICD-10-CM

## 2014-07-02 DIAGNOSIS — R69 Illness, unspecified: Secondary | ICD-10-CM

## 2014-07-02 DIAGNOSIS — R1084 Generalized abdominal pain: Secondary | ICD-10-CM

## 2014-07-02 DIAGNOSIS — N184 Chronic kidney disease, stage 4 (severe): Secondary | ICD-10-CM | POA: Diagnosis present

## 2014-07-02 DIAGNOSIS — G8929 Other chronic pain: Secondary | ICD-10-CM

## 2014-07-02 DIAGNOSIS — M1990 Unspecified osteoarthritis, unspecified site: Secondary | ICD-10-CM | POA: Diagnosis present

## 2014-07-02 DIAGNOSIS — F411 Generalized anxiety disorder: Secondary | ICD-10-CM | POA: Diagnosis present

## 2014-07-02 LAB — I-STAT CHEM 8, ED
BUN: 39 mg/dL — AB (ref 6–23)
CALCIUM ION: 1.13 mmol/L (ref 1.12–1.23)
CHLORIDE: 102 meq/L (ref 96–112)
Creatinine, Ser: 1.6 mg/dL — ABNORMAL HIGH (ref 0.50–1.10)
Glucose, Bld: 126 mg/dL — ABNORMAL HIGH (ref 70–99)
HEMATOCRIT: 29 % — AB (ref 36.0–46.0)
Hemoglobin: 9.9 g/dL — ABNORMAL LOW (ref 12.0–15.0)
POTASSIUM: 4 meq/L (ref 3.7–5.3)
Sodium: 134 mEq/L — ABNORMAL LOW (ref 137–147)
TCO2: 23 mmol/L (ref 0–100)

## 2014-07-02 LAB — CBC WITH DIFFERENTIAL/PLATELET
BASOS ABS: 0 10*3/uL (ref 0.0–0.1)
BASOS PCT: 0 % (ref 0–1)
EOS ABS: 0 10*3/uL (ref 0.0–0.7)
Eosinophils Relative: 0 % (ref 0–5)
HEMATOCRIT: 26.9 % — AB (ref 36.0–46.0)
HEMOGLOBIN: 8.7 g/dL — AB (ref 12.0–15.0)
Lymphocytes Relative: 4 % — ABNORMAL LOW (ref 12–46)
Lymphs Abs: 0.7 10*3/uL (ref 0.7–4.0)
MCH: 28.1 pg (ref 26.0–34.0)
MCHC: 32.3 g/dL (ref 30.0–36.0)
MCV: 86.8 fL (ref 78.0–100.0)
MONO ABS: 0.9 10*3/uL (ref 0.1–1.0)
Monocytes Relative: 5 % (ref 3–12)
Neutro Abs: 17.5 10*3/uL — ABNORMAL HIGH (ref 1.7–7.7)
Neutrophils Relative %: 91 % — ABNORMAL HIGH (ref 43–77)
Platelets: 606 10*3/uL — ABNORMAL HIGH (ref 150–400)
RBC: 3.1 MIL/uL — ABNORMAL LOW (ref 3.87–5.11)
RDW: 16.3 % — AB (ref 11.5–15.5)
WBC: 19 10*3/uL — ABNORMAL HIGH (ref 4.0–10.5)

## 2014-07-02 LAB — I-STAT VENOUS BLOOD GAS, ED
Acid-Base Excess: 1 mmol/L (ref 0.0–2.0)
BICARBONATE: 26 meq/L — AB (ref 20.0–24.0)
O2 Saturation: 92 %
TCO2: 27 mmol/L (ref 0–100)
pCO2, Ven: 41.7 mmHg — ABNORMAL LOW (ref 45.0–50.0)
pH, Ven: 7.402 — ABNORMAL HIGH (ref 7.250–7.300)
pO2, Ven: 64 mmHg — ABNORMAL HIGH (ref 30.0–45.0)

## 2014-07-02 LAB — BASIC METABOLIC PANEL
ANION GAP: 13 (ref 5–15)
BUN: 42 mg/dL — ABNORMAL HIGH (ref 6–23)
CALCIUM: 9 mg/dL (ref 8.4–10.5)
CO2: 24 mEq/L (ref 19–32)
CREATININE: 1.56 mg/dL — AB (ref 0.50–1.10)
Chloride: 94 mEq/L — ABNORMAL LOW (ref 96–112)
GFR, EST AFRICAN AMERICAN: 43 mL/min — AB (ref >90)
GFR, EST NON AFRICAN AMERICAN: 37 mL/min — AB (ref >90)
Glucose, Bld: 123 mg/dL — ABNORMAL HIGH (ref 70–99)
Potassium: 3.9 mEq/L (ref 3.7–5.3)
Sodium: 131 mEq/L — ABNORMAL LOW (ref 137–147)

## 2014-07-02 LAB — I-STAT TROPONIN, ED: Troponin i, poc: 0.02 ng/mL (ref 0.00–0.08)

## 2014-07-02 LAB — POC OCCULT BLOOD, ED: FECAL OCCULT BLD: POSITIVE — AB

## 2014-07-02 LAB — I-STAT CG4 LACTIC ACID, ED: Lactic Acid, Venous: 1.87 mmol/L (ref 0.5–2.2)

## 2014-07-02 LAB — PRO B NATRIURETIC PEPTIDE: PRO B NATRI PEPTIDE: 27552 pg/mL — AB (ref 0–125)

## 2014-07-02 MED ORDER — ACETAMINOPHEN 650 MG RE SUPP: 650.0000 mg | Freq: Four times a day (QID) | RECTAL | Status: DC | PRN

## 2014-07-02 MED ORDER — HEPARIN SODIUM (PORCINE) 5000 UNIT/ML IJ SOLN
5000.0000 [IU] | Freq: Three times a day (TID) | INTRAMUSCULAR | Status: DC
  Administered 2014-07-02 – 2014-07-04 (×7): 5000 [IU] via SUBCUTANEOUS
  Filled 2014-07-02 (×8): qty 1

## 2014-07-02 MED ORDER — ALBUTEROL SULFATE (2.5 MG/3ML) 0.083% IN NEBU: 3.0000 mL | INHALATION_SOLUTION | Freq: Two times a day (BID) | RESPIRATORY_TRACT | Status: DC | PRN

## 2014-07-02 MED ORDER — SODIUM CHLORIDE 0.9 % IV SOLN: 250.0000 mL | INTRAVENOUS | Status: DC | PRN

## 2014-07-02 MED ORDER — SODIUM CHLORIDE 0.9 % IJ SOLN
3.0000 mL | Freq: Two times a day (BID) | INTRAMUSCULAR | Status: DC
  Administered 2014-07-02 – 2014-07-03 (×3): 3 mL via INTRAVENOUS

## 2014-07-02 MED ORDER — SODIUM CHLORIDE 0.9 % IJ SOLN
3.0000 mL | Freq: Two times a day (BID) | INTRAMUSCULAR | Status: DC
  Administered 2014-07-03 – 2014-07-04 (×3): 3 mL via INTRAVENOUS

## 2014-07-02 MED ORDER — SODIUM CHLORIDE 0.9 % IV BOLUS (SEPSIS): 1000.0000 mL | Freq: Once | INTRAVENOUS | Status: DC

## 2014-07-02 MED ORDER — BOOST / RESOURCE BREEZE PO LIQD
1.0000 | Freq: Three times a day (TID) | ORAL | Status: DC
  Administered 2014-07-02 – 2014-07-04 (×6): 1 via ORAL
  Administered 2014-07-04: 14:00:00 via ORAL

## 2014-07-02 MED ORDER — VENLAFAXINE HCL ER 150 MG PO CP24
300.0000 mg | ORAL_CAPSULE | Freq: Every day | ORAL | Status: DC
  Administered 2014-07-03 – 2014-07-04 (×2): 300 mg via ORAL
  Filled 2014-07-02 (×3): qty 2

## 2014-07-02 MED ORDER — FUROSEMIDE 10 MG/ML IJ SOLN
40.0000 mg | Freq: Once | INTRAMUSCULAR | Status: AC
  Administered 2014-07-02: 40 mg via INTRAVENOUS
  Filled 2014-07-02: qty 4

## 2014-07-02 MED ORDER — IPRATROPIUM BROMIDE 0.02 % IN SOLN: 0.5000 mg | Freq: Four times a day (QID) | RESPIRATORY_TRACT | Status: DC | PRN

## 2014-07-02 MED ORDER — GABAPENTIN 400 MG PO CAPS
400.0000 mg | ORAL_CAPSULE | Freq: Three times a day (TID) | ORAL | Status: DC
  Administered 2014-07-02 – 2014-07-04 (×7): 400 mg via ORAL
  Filled 2014-07-02 (×8): qty 1

## 2014-07-02 MED ORDER — MOMETASONE FURO-FORMOTEROL FUM 100-5 MCG/ACT IN AERO
2.0000 | INHALATION_SPRAY | Freq: Two times a day (BID) | RESPIRATORY_TRACT | Status: DC
  Administered 2014-07-02 – 2014-07-04 (×4): 2 via RESPIRATORY_TRACT
  Filled 2014-07-02: qty 8.8

## 2014-07-02 MED ORDER — ACETAMINOPHEN 325 MG PO TABS: 650.0000 mg | ORAL_TABLET | Freq: Four times a day (QID) | ORAL | Status: DC | PRN

## 2014-07-02 MED ORDER — ALBUTEROL SULFATE (2.5 MG/3ML) 0.083% IN NEBU: 2.5000 mg | INHALATION_SOLUTION | Freq: Four times a day (QID) | RESPIRATORY_TRACT | Status: DC | PRN

## 2014-07-02 MED ORDER — ONDANSETRON HCL 4 MG/2ML IJ SOLN: 4.0000 mg | Freq: Four times a day (QID) | INTRAMUSCULAR | Status: DC | PRN

## 2014-07-02 MED ORDER — INFLUENZA VAC SPLIT QUAD 0.5 ML IM SUSY
0.5000 mL | PREFILLED_SYRINGE | INTRAMUSCULAR | Status: AC
  Administered 2014-07-03: 0.5 mL via INTRAMUSCULAR
  Filled 2014-07-02: qty 0.5

## 2014-07-02 MED ORDER — ONDANSETRON HCL 4 MG PO TABS: 4.0000 mg | ORAL_TABLET | Freq: Four times a day (QID) | ORAL | Status: DC | PRN

## 2014-07-02 MED ORDER — VITAMIN D3 25 MCG (1000 UNIT) PO TABS
1000.0000 [IU] | ORAL_TABLET | Freq: Every day | ORAL | Status: DC
  Administered 2014-07-02 – 2014-07-04 (×3): 1000 [IU] via ORAL
  Filled 2014-07-02 (×3): qty 1

## 2014-07-02 MED ORDER — NICOTINE 21 MG/24HR TD PT24
21.0000 mg | MEDICATED_PATCH | Freq: Every day | TRANSDERMAL | Status: DC
  Administered 2014-07-02 – 2014-07-04 (×3): 21 mg via TRANSDERMAL
  Filled 2014-07-02 (×3): qty 1

## 2014-07-02 MED ORDER — DEXTROSE 5 % IV SOLN
2.0000 g | Freq: Once | INTRAVENOUS | Status: AC
  Administered 2014-07-02: 2 g via INTRAVENOUS
  Filled 2014-07-02: qty 2

## 2014-07-02 MED ORDER — PREDNISONE 20 MG PO TABS
20.0000 mg | ORAL_TABLET | Freq: Two times a day (BID) | ORAL | Status: DC
  Administered 2014-07-02 – 2014-07-04 (×4): 20 mg via ORAL
  Filled 2014-07-02 (×5): qty 1

## 2014-07-02 MED ORDER — RAMELTEON 8 MG PO TABS
8.0000 mg | ORAL_TABLET | Freq: Every day | ORAL | Status: DC
  Administered 2014-07-02 – 2014-07-03 (×2): 8 mg via ORAL
  Filled 2014-07-02 (×3): qty 1

## 2014-07-02 MED ORDER — IPRATROPIUM-ALBUTEROL 0.5-2.5 (3) MG/3ML IN SOLN
3.0000 mL | Freq: Once | RESPIRATORY_TRACT | Status: AC
  Administered 2014-07-02: 3 mL via RESPIRATORY_TRACT
  Filled 2014-07-02: qty 3

## 2014-07-02 MED ORDER — GABAPENTIN 400 MG PO CAPS: 400.0000 mg | ORAL_CAPSULE | Freq: Four times a day (QID) | ORAL | Status: DC

## 2014-07-02 MED ORDER — ALPRAZOLAM 0.5 MG PO TABS
0.5000 mg | ORAL_TABLET | Freq: Every day | ORAL | Status: DC | PRN
  Administered 2014-07-03: 0.5 mg via ORAL
  Filled 2014-07-02: qty 1

## 2014-07-02 MED ORDER — AMLODIPINE BESYLATE 5 MG PO TABS
5.0000 mg | ORAL_TABLET | Freq: Every day | ORAL | Status: DC
  Administered 2014-07-02 – 2014-07-03 (×2): 5 mg via ORAL
  Filled 2014-07-02 (×2): qty 1

## 2014-07-02 MED ORDER — GABAPENTIN 600 MG PO TABS
1200.0000 mg | ORAL_TABLET | Freq: Every day | ORAL | Status: DC
  Administered 2014-07-02 – 2014-07-04 (×3): 1200 mg via ORAL
  Filled 2014-07-02 (×3): qty 2

## 2014-07-02 MED ORDER — PRAZOSIN HCL 2 MG PO CAPS
2.0000 mg | ORAL_CAPSULE | Freq: Every day | ORAL | Status: DC
  Administered 2014-07-02 – 2014-07-03 (×2): 2 mg via ORAL
  Filled 2014-07-02 (×3): qty 1

## 2014-07-02 MED ORDER — SODIUM CHLORIDE 0.9 % IJ SOLN: 3.0000 mL | INTRAMUSCULAR | Status: DC | PRN

## 2014-07-02 MED ORDER — PANTOPRAZOLE SODIUM 40 MG PO TBEC
40.0000 mg | DELAYED_RELEASE_TABLET | Freq: Every day | ORAL | Status: DC
  Administered 2014-07-02 – 2014-07-04 (×3): 40 mg via ORAL
  Filled 2014-07-02 (×3): qty 1

## 2014-07-02 MED ORDER — METOPROLOL TARTRATE 50 MG PO TABS
50.0000 mg | ORAL_TABLET | Freq: Two times a day (BID) | ORAL | Status: DC
  Administered 2014-07-02 – 2014-07-04 (×5): 50 mg via ORAL
  Filled 2014-07-02 (×6): qty 1

## 2014-07-02 MED ORDER — GABAPENTIN 800 MG PO TABS
400.0000 mg | ORAL_TABLET | Freq: Three times a day (TID) | ORAL | Status: DC
  Filled 2014-07-02 (×2): qty 0.5

## 2014-07-02 MED ORDER — TRAMADOL HCL 50 MG PO TABS
50.0000 mg | ORAL_TABLET | Freq: Three times a day (TID) | ORAL | Status: DC
  Administered 2014-07-02 – 2014-07-04 (×7): 50 mg via ORAL
  Filled 2014-07-02 (×7): qty 1

## 2014-07-02 NOTE — H&P (Signed)
Date: 07/02/2014               Patient Name:  [name] MRN: [id number]  DOB: [birth year] Age / Sex: 53 y.o., female   PCP: Leamon Arnt, MD         Medical Service: Internal Medicine Teaching Service         Attending Physician: Dr. Michel Bickers, MD    First Contact: Dr. Genene Churn  Pager: [phone]  Second Contact: Dr. Heber Florence Pager: [phone]       After Hours (After 5p/  First Contact Pager: [phone]  weekends / holidays): Second Contact Pager: [phone]   Chief Complaint: Shortness of breath  History of Present Illness:  [name] is a 53 year old woman with PMH of COPD, CKD stage 3, bipolar disorder, schizophrenia, chronic generalized abdominal pain, HTN, who presents to the [facility] ED for evaluation of shortness of breath. She has had shortness of breath for 2 weeks now with cough productive of clear/yellow sputum and wheezing as well as subjective fever. She was treated for bilateral lower lobe pneumonia at [place] with Levaquin which she did not take due to her own report of having myasthenia gravis (no formal diagnosis in her chart per PCP). Instead of the Levaquin she was given Zpack by her pharmacist which she finished on Tuesday. She felt somewhat better for a a few days while taking Zpack but worsened on Monday and was seen at the [facility] ED. She was given Azithromycin IV and Ceftriaxone IV during that ED visit and was discharged with prescription for prednisone, DuoNeb, and continuation of Zpack. Her cough improved but her shortness of breath persisted and she returned to the ED today.   In the ED she was found to be hypoxic with O2 sats to 80s (though not charted), with tachypnea (RR of 25), and was started on oxygen supplementation. VBG was 7.4/41/64/26. Lactic acid 1.87. ProBNP elevated to [address]. CXR with no consolidation but mild cardiomegaly and moderate CHF. Nonetheless, she was started on Ceftriaxone IV for pneumonia. She was also found to be hypertensive with BP of 194/87  and was given Lasix 40mg  IV once. The IMTS team was called for admission for further evaluation and treatment of her shortness of breath.    Outpatient medications: ALBUTEROL SULFATE HFA 108 (90 BASE) MCG/ACT INHALER Inhale 2 puffs into the lungs every 6 (six) hours as needed for Wheezing or Shortness of Breath.  ALPRAZOLAM (XANAX) 0.25 MG TABLET Take 0.25 mg by mouth 3 (three) times a day as needed for Sleep.  CALCIUM CARBONATE-VITAMIN D (CALTRATE 600 + D) 600-400 MG-UNIT PER TABLET Take 1 tablet by mouth 2 (two) times daily.  AMLODIPINE 5mg  once daily CONJUGATED ESTROGENS (PREMARIN) VAGINAL CREAM Place 0.5 g vaginally at bedtime as needed.  FLUTICASONE-SALMETEROL (ADVAIR DISKUS) 500-50 MCG/DOSE AEPB INHALATION POWDER Inhale 1 puff into the lungs 2 (two) times daily.  GABAPENTIN (NEURONTIN) 300 MG CAPSULE Take 2 capsules (600 mg total) by mouth 3 (three) times a day.  HYDROCHLOROTHIAZIDE (HYDRODIURIL) 25 MG TABLET Take 1 tablet (25 mg total) by mouth daily.  IBUPROFEN (MOTRIN) 200 MG TABLET Take 200 mg by mouth every 6 (six) hours as needed.  IPRATROPIUM (ATROVENT HFA) 17 MCG/ACT INHALER Inhale 2 puffs into the lungs 2 (two) times daily.  METOPROLOL TARTRATE (LOPRESSOR) 50 MG TABLET Take 1 tablet (50 mg total) by mouth 2 (two) times daily.  PRAZOSIN 2mg  once tablet by mouth daily at bedtime PANTOPRAZOLE SODIUM (PROTONIX) 40 MG TABLET  Take 1 tablet (40 mg total) by mouth every morning.  TRAMADOL (ULTRAM) 50 MG TABLET Take 1 tablet (50 mg total) by mouth every 8 (eight) hours as needed for Pain.  VENLAFAXINE HCL (EFFEXOR XR) 75 MG 24 HR CAPSULE Take 1 capsule (75 mg total) by mouth at bedtime.    Meds: Current Facility-Administered Medications  Medication Dose Route Frequency Provider Last Rate Last Dose  . 0.9 %  sodium chloride infusion  250 mL Intravenous PRN Blain Pais, MD      . acetaminophen (TYLENOL) tablet 650 mg  650 mg Oral Q6H PRN Blain Pais, MD       Or  .  acetaminophen (TYLENOL) suppository 650 mg  650 mg Rectal Q6H PRN Blain Pais, MD      . albuterol (PROVENTIL) (2.5 MG/3ML) 0.083% nebulizer solution 2.5 mg  2.5 mg Nebulization Q6H PRN Blain Pais, MD      . albuterol (PROVENTIL) (2.5 MG/3ML) 0.083% nebulizer solution 3 mL  3 mL Inhalation BID PRN Blain Pais, MD      . ALPRAZolam Duanne Moron) tablet 0.5 mg  0.5 mg Oral Daily PRN Blain Pais, MD      . amLODipine (NORVASC) tablet 5 mg  5 mg Oral Daily Blain Pais, MD      . cholecalciferol (VITAMIN D) tablet 1,000 Units  1,000 Units Oral Daily Blain Pais, MD      . furosemide (LASIX) injection 40 mg  40 mg Intravenous Once Blain Pais, MD      . gabapentin (NEURONTIN) tablet 1,200 mg  1,200 mg Oral QHS Michel Bickers, MD      . gabapentin (NEURONTIN) tablet 400 mg  400 mg Oral TID [facility] Michel Bickers, MD      . heparin injection 5,000 Units  5,000 Units Subcutaneous 3 times per day Blain Pais, MD      . ipratropium (ATROVENT) nebulizer solution 0.5 mg  0.5 mg Nebulization Q6H PRN Blain Pais, MD      . metoprolol (LOPRESSOR) tablet 50 mg  50 mg Oral BID Blain Pais, MD      . mometasone-formoterol (DULERA) 100-5 MCG/ACT inhaler 2 puff  2 puff Inhalation BID Blain Pais, MD      . nicotine (NICODERM CQ - dosed in mg/24 hours) patch 21 mg  21 mg Transdermal Daily Blain Pais, MD      . ondansetron (ZOFRAN) tablet 4 mg  4 mg Oral Q6H PRN Blain Pais, MD       Or  . ondansetron (ZOFRAN) injection 4 mg  4 mg Intravenous Q6H PRN Blain Pais, MD      . pantoprazole (PROTONIX) EC tablet 40 mg  40 mg Oral Daily Blain Pais, MD      . prazosin (MINIPRESS) capsule 2 mg  2 mg Oral QHS Blain Pais, MD      . predniSONE (DELTASONE) tablet 20 mg  20 mg Oral BID Blain Pais, MD      . sodium chloride 0.9 % injection 3 mL  3 mL Intravenous Q12H Blain Pais, MD      . sodium  chloride 0.9 % injection 3 mL  3 mL Intravenous Q12H Blain Pais, MD      . sodium chloride 0.9 % injection 3 mL  3 mL Intravenous PRN Blain Pais, MD      . traMADol Veatrice Bourbon) tablet 50 mg  50 mg Oral Q8H Blain Pais, MD      . Derrill Memo ON 07/03/2014] venlafaxine XR (EFFEXOR-XR) 24 hr capsule 300 mg  300 mg Oral Q breakfast Blain Pais, MD        Allergies: Allergies as of 07/02/2014 - Review Complete 07/02/2014  Allergen Reaction Noted  . Doxycycline Anaphylaxis and Hives   . Aspirin Other (See Comments) 10/16/2011  . Hydrocodone-acetaminophen Nausea And Vomiting   . Iohexol Itching 10/16/2011   Past Medical History  Diagnosis Date  . COPD (chronic obstructive pulmonary disease) 2000  . Carpal tunnel syndrome   . Diverticulitis 2008  . Pancreatitis 10/2011  . Schizophrenia   . RLS (restless legs syndrome)   . GERD (gastroesophageal reflux disease) 2008  . Bipolar 1 disorder   . Small bowel obstruction 2008  . Cervical cancer 1985  . History of colon polyps 2009  . Anemia 2008  . Hypertension 2013  . Headache(784.0)   . Anxiety   . Asthma   . Arthritis 2000  . Depression 2000  . HLD (hyperlipidemia) 2013  . IBS (irritable bowel syndrome) 2008  . Shortness of breath   . Renal disorder    Past Surgical History  Procedure Laterality Date  . Colon surgery  2008    6 inches of colon removed due to obstruction  . Partial hysterectomy  1995  . Total abdominal hysterectomy  1996   Family History  Problem Relation Age of Onset  . Other Mother     many bowel obstructions  . Kidney cancer Father   . Bone cancer Father   . Diabetes Father   . Diabetes Daughter   . Heart disease Mother   . Heart disease Father    History   Social History  . Marital Status: Single    Spouse Name: N/A    Number of Children: 1  . Years of Education: N/A   Occupational History  . grill cook/cashier    Social History Main Topics  . Smoking status: Current  Every Day Smoker -- 1.50 packs/day for 39 years    Types: Cigarettes  . Smokeless tobacco: Never Used  . Alcohol Use: No  . Drug Use: No  . Sexual Activity: Yes    Birth Control/ Protection: Post-menopausal   Other Topics Concern  . Not on file   Social History Narrative  . No narrative on file    Review of Systems: Review of Systems  Constitutional: Negative for fever, chills, weight loss, malaise/fatigue and diaphoresis.  Respiratory: Positive for cough, shortness of breath and wheezing. Negative for hemoptysis.   Cardiovascular: Positive for leg swelling. Negative for chest pain and orthopnea.  Gastrointestinal: Negative for nausea, vomiting and diarrhea.  Genitourinary: Negative for dysuria.  Musculoskeletal: Positive for back pain. Negative for myalgias.  Skin: Negative for rash.  Neurological: Negative for dizziness and weakness.  Psychiatric/Behavioral: Negative for depression and suicidal ideas. The patient is not nervous/anxious.     Physical Exam: Blood pressure 200/94, pulse 74, temperature 98 F (36.7 C), temperature source Oral, resp. rate 22, height 5\' 1"  (1.549 m), weight 95 lb 10.9 oz (43.4 kg), SpO2 98.00%. Physical Exam  Nursing note and vitals reviewed. Constitutional: She is oriented to person, place, and time. She appears well-developed. No distress.  Thin   Eyes: Conjunctivae are normal. No scleral icterus.  Cardiovascular: Normal rate and regular rhythm.   No murmur heard. Respiratory: Effort normal. No respiratory distress. She has wheezes. She has rales. She  exhibits no tenderness.  Bilateral crackles up to her mid lung fields.  Faint expiratory wheezing at the bases  GI: Soft. Bowel sounds are normal. She exhibits no distension. There is no tenderness. There is no rebound and no guarding.  Musculoskeletal:  Trace pitting edema bilaterally up to her knees  Neurological: She is alert and oriented to person, place, and time.  Skin: Skin is warm and  dry. No rash noted. She is not diaphoretic. No erythema. No pallor.  Psychiatric: She has a normal mood and affect.     Lab results: Basic Metabolic Panel:  Recent Labs  07/02/14 0655 07/02/14 0709  NA 131* 134*  K 3.9 4.0  CL 94* 102  CO2 24  --   GLUCOSE 123* 126*  BUN 42* 39*  CREATININE 1.56* 1.60*  CALCIUM 9.0  --    CBC:  Recent Labs  07/02/14 0655 07/02/14 0709  WBC 19.0*  --   NEUTROABS 17.5*  --   HGB 8.7* 9.9*  HCT 26.9* 29.0*  MCV 86.8  --   PLT 606*  --    BNP:  Recent Labs  07/02/14 0655  PROBNP 27552.0*   Urine Drug Screen: Drugs of Abuse     Component Value Date/Time   LABOPIA NONE DETECTED 05/12/2013 1536   COCAINSCRNUR NONE DETECTED 05/12/2013 1536   LABBENZ NONE DETECTED 05/12/2013 1536   AMPHETMU NONE DETECTED 05/12/2013 1536   THCU POSITIVE* 05/12/2013 1536   LABBARB NONE DETECTED 05/12/2013 1536     Imaging results:  Dg Chest 2 View  07/02/2014   CLINICAL DATA:  Short of breath.  Cough.  Evaluate pneumonia.  EXAM: CHEST  2 VIEW  COMPARISON:  06/28/2014.  FINDINGS: Mild cardiomegaly. New basilar opacity with interstitial and alveolar airspace disease consistent with pulmonary edema. New bilateral pleural effusions. Basilar atelectasis. Old RIGHT clavicle fracture noted. Given the interstitial opacity and alveolar opacity with pleural effusions, the findings are most compatible with CHF.  IMPRESSION: Mild to moderate CHF.   Electronically Signed   By: Dereck Ligas M.D.   On: 07/02/2014 07:19    Other results: ED ECG REPORT  Date: 07/02/2014  EKG Time: 646  Rate: 69  Rhythm: normal sinus rhythm,    Axis: normal  Intervals: borderline short PR interval, nl QTc.   ST&T Change: TWi in V1 and V2 as seen in previous tracings. LVH  Narrative Interpretation: Biatrial enlargement, unchanged from previous tracing     Assessment & Plan by Problem: 53 year old woman with PMH of COPD, current smoker, CKD stage 3, Bipolar disorder,  schizophrenia, chronic generalized abdominal pain, presenting with shortness of breath x 2 weeks with a cough that improved after tx for CAP but SOB that has persisted with CXR and physical exam findings of volume overload.   Shortness of breath: With hypoxia. Likely multifactorial but most likely exacerbated by volume overload. She does not carry formal diagnosis of CHF with stress echocardiogram in January 2014 showing normal systolic function with no wall motion abnormality. She does have worsening renal function with creatinine of 2.3 per her last PCP's visit in 03/29/14. Her Creatinine here is 1.6 with hyponatremia and proBNP elevated to 27552. Her CXR is c/w moderate CHF findings--I dicussed her CXR of today with Dr. Gerilyn Pilgrim and he explains that her findings are very unlikely consistent with pneumonia. She was recently treated for CAP with reported fever 1 week ago but has not had fever since finishing her Zpack and her cough is  no longer productive. She has a leucocytosis but this is non specific and could also be secondary to prednisone use. She denies fever/chills since last week. She received ceftriaxone 2g in the ED with blood cultures drawn. Well's score and geneva score for PE are zero with very low suspicious for PE.   -Admit to telemetry  -Will not continue Abx for now, continue monitoring for fever -Lasix 40mg  IV BID, may increase if no increase in UOP -Strict intake and output -Daily weights -BMET in am for Creatinine and potassium trending -CBC in am -DuoNebs q6hr PRN for wheezing/COPD -smoking cessation counseling -Consider repeating 2 view CXR depending on symptoms    COPD: Shortness of breath could be due to mild exacerbation though volume overload with pulmonary edema is the most likely explanation. Very mild wheezing on physical exam.  -O2 supplementation PRN -Continue prednisone 40 mg daily (day 4/5) -Ambulate with O2 sats once SOB is improved -Continue DuoNebs -Dulera (on  home Advair but this is not preferred formulary) -incentive spirometry -counseling on tobacco cessation   HTN: BP elevated here at 194/87 on presentation. Pt did not take her home medications which are amlodipine 5mg  daily, prazosin 2mg  qHS, metoprolol 50mg  BID, and HCTZ 25mg  daily.  -Resume amlodipine 5mg   -Lasix 40mg  IV BID per above -Hold HCTZ while she is on Lasix -Resume metoprolol 50mg  BID  Anemia of chronic disease: Hgb of 8.7-9.9 here, BL since dx of CKD is unknown. Likely multifactorial due to anemia of chronic disease (CKD), dilutional, protein-calorie malnutrition (BMI of 18) . Anemia panel from 2009 revealed  iron of 60, ferritin of 25, nl folate of  She has hx of FOBT positive with extensive work up. Per ED provider, rectal exam with a small, nodular mass, it feels to be about half a centimeter right at the anal entrance. She has history of colonoscopy with Dr. 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 Last colonoscopy in December 2009 with single small, hyperplastic appearing sigmoid polyp, normal appearing anastomosis at 25cm from anus from previous diverticulitis surgery. She also had endoscopy on December 2009 that was normal. Her CKD has progressively worsened in the last year and may also explain her anemia.  -Monitor for s/s of bleeding -Diuresis per above -Dietitian consult -CBC in am -anemia panel -follow up with GI as outpatient -follow up with Nephrology as outpatient   CKD stage 3: Followed by Dr. Jimmy Footman in Nephrology. Bl Cr appears to be ~1.8 but 1.6 here. Hypervolemia could be 2/2 to her decline in renal function, may need fluid restriction, chronic diuretic use -BMET in am -Lasix IV per above -Continue home vitamin D supplementation   Anxiety - Continue home Xanax (this tx was confirmed by her PCP).  -Xanax 0.5mg  daily PRN for anxiety  Bipolar disorder - On  Effexor 150mg  24 hr at home which was continued.  She is not on a mood stabilizer per her PCP's note but is followed by psychiatry.   Schizophrenia - Stable. Reported in her chart but not on antipsychotic. Followed by psychiatry as outpt.   Tobacco use disorder- Has 40 pack-year smoking history.  -Continue counseling on smoking cessation -nicotine patch 21mg  daily  Chronic generalized abdominal pain - She has chronic abdominal pain with extensive work up. Her PCP does not prescribed Percocet for her but she has had this tx off and one for various provider for other complaints.  -Continue home tramadol  50mg  q8hr PRN for pain -Continue gabapentin 400mg  TID ac and 1200mg  qHS.  -Pt will need pain clinic referral from her PCP after her discharge  Protein calorie malnutrition - Likely due to worsening renal disease and COPD. BMI of 18.  -Consult to dietitian  FEN:  [place] BMET in am Renal diet  DVT prophylaxis: Heparin [place] TID  Dispo: Disposition is deferred at this time, awaiting improvement of current medical problems. Anticipated discharge in approximately 1-2 day(s).   The patient does have a current PCP Leamon Arnt, MD) and does not need an [facility] hospital follow-up appointment after discharge.  The patient does not have transportation limitations that hinder transportation to clinic appointments.  Signed: Blain Pais, MD 07/02/2014, 11:39 AM

## 2014-07-02 NOTE — Progress Notes (Signed)
Patient BP 200/89 on admission.  MD notified via resident pager.  MD stated to give patient's daily blood pressure medications and recheck.  Will continue to monitor.

## 2014-07-02 NOTE — Progress Notes (Signed)
  Echocardiogram 2D Echocardiogram has been performed.  [name] 07/02/2014, 3:07 PM

## 2014-07-02 NOTE — Progress Notes (Signed)
Admission note:  Arrival Method: stretcher from ED Mental Orientation: A & O x 4 Telemetry: placed on telemetry box 10, CCMD notified Assessment: Assessed and documented in Doc Flowsheets Skin: intact IV: right arm, saline locked Pain: denies Tubes: none Safety Measures: Educated on fall precautions, verbalized understaning Admission Screening: Called Admission Nurse  [phone] Orientation: Patient has been oriented to the unit, staff and to the room.

## 2014-07-02 NOTE — ED Notes (Signed)
Admitting physician at bedside

## 2014-07-02 NOTE — Progress Notes (Signed)
UR completed 

## 2014-07-02 NOTE — ED Provider Notes (Addendum)
I have assumed care for the patient and examined her at 735. The patient is alert interactive her speech is clear. She is tachypneic but saturating 100% on nasal cannula oxygen. Lung sounds have coarse crackles and occasional expiratory wheeze predominantly clear with coughing. Heart is regular and tachycardic I do not appreciate any rub murmur gallop. Her lower extremities have no edema the calves are soft and nontender.  At this time chest x-ray interpretation by radiology is for CHF and she does have elevated BNP. However there were focal infiltrate on chest x-ray done on 928, these have increased in density, and the patient reports she had a temperature documented at 103 one week ago. I due to a presentation is consistent with a pneumonia with failed outpatient treatment and possible associated CHF, with primary illness being pneumonia. She did not endorse classic influenza symptoms of acute onset of illness with fever body aches chills. Onset sounds more insidious starting with nonproductive cough for several weeks.  Rectal exam is for a small, nodular mass, it feels to be about half a centimeter right at the anal entrance. She has several hard small stools in the vault. The stool is brown and normal color. No melena, no blood tinging.  CRITICAL CARE Performed by: Charlesetta Shanks   Total critical care time: 30  Critical care time was exclusive of separately billable procedures and treating other patients.  Critical care was necessary to treat or prevent imminent or life-threatening deterioration.  Critical care was time spent personally by me on the following activities: development of treatment plan with patient and/or surrogate as well as nursing, discussions with consultants, evaluation of patient's response to treatment, examination of patient, obtaining history from patient or surrogate, ordering and performing treatments and interventions, ordering and review of laboratory studies,  ordering and review of radiographic studies, pulse oximetry and re-evaluation of patient's condition.  Charlesetta Shanks, MD 07/02/14 [id number]  Charlesetta Shanks, MD 07/02/14 [place], MD 07/02/14 [phone]

## 2014-07-02 NOTE — ED Provider Notes (Addendum)
CSN: [id number]     Arrival date & time 07/02/14  [id number] History   First MD Initiated Contact with Patient 07/02/14 [phone]     Chief Complaint  Patient presents with  . Shortness of Breath     (Consider location/radiation/quality/duration/timing/severity/associated sxs/prior Treatment) HPI [name] is a 53 y.o. female with past medical history of COPD, chronic kidney disease coming in with shortness of breath. Patient was seen at [facility] a week ago was diagnosed with pneumonia. She was sent home on Levaquin however she could not take it due to her myasthenia gravis. The pharmacist gave her azithromycin instead and she completed this course. She states that she does not feel like she's gotten better since then. She's been using her inhaler more often but denies wheezing. She's had a nonproductive cough as well. She has not been admitted to the hospital since January 2014. Patient denies wearing oxygen at home. She has no further complaints.  10 Systems reviewed and are negative for acute change except as noted in the HPI.      Past Medical History  Diagnosis Date  . COPD (chronic obstructive pulmonary disease) 2000  . Carpal tunnel syndrome   . Diverticulitis 2008  . Pancreatitis 10/2011  . Schizophrenia   . RLS (restless legs syndrome)   . GERD (gastroesophageal reflux disease) 2008  . Bipolar 1 disorder   . Small bowel obstruction 2008  . Cervical cancer 1985  . History of colon polyps 2009  . Anemia 2008  . Hypertension 2013  . Headache(784.0)   . Anxiety   . Asthma   . Arthritis 2000  . Depression 2000  . HLD (hyperlipidemia) 2013  . IBS (irritable bowel syndrome) 2008  . Shortness of breath   . Renal disorder    Past Surgical History  Procedure Laterality Date  . Colon surgery  2008    6 inches of colon removed due to obstruction  . Partial hysterectomy  1995  . Total abdominal hysterectomy  1996   Family History  Problem Relation Age of Onset  . Other Mother      many bowel obstructions  . Kidney cancer Father   . Bone cancer Father   . Diabetes Father   . Diabetes Daughter   . Heart disease Mother   . Heart disease Father    History  Substance Use Topics  . Smoking status: Current Every Day Smoker -- 1.50 packs/day for 39 years    Types: Cigarettes  . Smokeless tobacco: Never Used  . Alcohol Use: No   OB History   Grav Para Term Preterm Abortions TAB SAB Ect Mult Living                 Review of Systems    Allergies  Doxycycline; Aspirin; Hydrocodone-acetaminophen; and Iohexol  Home Medications   Prior to Admission medications   Medication Sig Start Date End Date Taking? Authorizing Provider  albuterol (PROVENTIL HFA;VENTOLIN HFA) 108 (90 BASE) MCG/ACT inhaler Inhale 2 puffs into the lungs 2 (two) times daily as needed (asthma).     Historical Provider, MD  albuterol (PROVENTIL HFA;VENTOLIN HFA) 108 (90 BASE) MCG/ACT inhaler Inhale 2 puffs into the lungs every 6 (six) hours as needed for wheezing or shortness of breath. 06/28/14   Jennifer L Piepenbrink, PA-C  ALPRAZolam (XANAX) 0.5 MG tablet Take 1 tablet (0.5 mg total) by mouth at bedtime as needed for sleep. 05/12/13   Mariea Clonts, MD  amLODipine (NORVASC) 10 MG tablet Take  10 mg by mouth daily.    Historical Provider, MD  azithromycin (ZITHROMAX) 250 MG tablet Take 250-500 mg by mouth daily. Take 2 tablets by mouth on day 1 then then 1 tablet every day for 4 days after. A total of 5 days of therapy    Historical Provider, MD  cholecalciferol (VITAMIN D) 1000 UNITS tablet Take 1,000 Units by mouth daily.    Historical Provider, MD  Fluticasone-Salmeterol (ADVAIR) 250-50 MCG/DOSE AEPB Inhale 1 puff into the lungs every 12 (twelve) hours.    Historical Provider, MD  gabapentin (NEURONTIN) 400 MG capsule Take 400-1,200 mg by mouth 4 (four) times daily. Patient takes 400 mg 3 times daily and 1200 mg at bedtime    Historical Provider, MD  guaiFENesin-codeine 100-10 MG/5ML syrup  Take 10 mLs by mouth 3 (three) times daily as needed for cough. 06/28/14   Jennifer L Piepenbrink, PA-C  hydrochlorothiazide (HYDRODIURIL) 25 MG tablet Take 25 mg by mouth daily.    Historical Provider, MD  metoprolol (LOPRESSOR) 50 MG tablet Take 50 mg by mouth 2 (two) times daily.    Historical Provider, MD  pantoprazole (PROTONIX) 40 MG tablet Take 40 mg by mouth daily.    Historical Provider, MD  predniSONE (DELTASONE) 20 MG tablet Take 2 tablets (40 mg total) by mouth daily. 06/28/14   Jennifer L Piepenbrink, PA-C  ranitidine (ZANTAC) 150 MG tablet Take 150 mg by mouth 2 (two) times daily.    Historical Provider, MD  venlafaxine XR (EFFEXOR XR) 75 MG 24 hr capsule Take 1 capsule (75 mg total) by mouth daily. 05/12/13   Mariea Clonts, MD   BP 187/92  Pulse 69  Temp(Src) 98.1 F (36.7 C) (Oral)  Resp 23  SpO2 93% Physical Exam  Nursing note and vitals reviewed. Constitutional: She is oriented to person, place, and time. She appears well-developed and well-nourished. She appears distressed.  HENT:  Head: Normocephalic and atraumatic.  Nose: Nose normal.  Mouth/Throat: Oropharynx is clear and moist. No oropharyngeal exudate.  Eyes: Conjunctivae and EOM are normal. Pupils are equal, round, and reactive to light. No scleral icterus.  Neck: Normal range of motion. Neck supple. No JVD present. No tracheal deviation present. No thyromegaly present.  Cardiovascular: Normal rate, regular rhythm and normal heart sounds.  Exam reveals no gallop and no friction rub.   No murmur heard. Pulmonary/Chest: Effort normal and breath sounds normal. No respiratory distress. She has no wheezes. She exhibits no tenderness.  Tachypnea noted, increased work of breathing, speaking in full sentences.  No wheezing or rhonchi heard on exam.  Abdominal: Soft. Bowel sounds are normal. She exhibits no distension and no mass. There is no tenderness. There is no rebound and no guarding.  Musculoskeletal: Normal range of  motion. She exhibits no edema and no tenderness.  Lymphadenopathy:    She has no cervical adenopathy.  Neurological: She is alert and oriented to person, place, and time. No cranial nerve deficit. She exhibits normal muscle tone.  Skin: Skin is warm and dry. No rash noted. She is not diaphoretic. No erythema. No pallor.    ED Course  Procedures (including critical care time) Labs Review Labs Reviewed  CULTURE, BLOOD (ROUTINE X 2)  CULTURE, BLOOD (ROUTINE X 2)  CBC WITH DIFFERENTIAL  BASIC METABOLIC PANEL  I-STAT CG4 LACTIC ACID, ED  I-STAT TROPOININ, ED  I-STAT VENOUS BLOOD GAS, ED  I-STAT CHEM 8, ED    Imaging Review No results found.   EKG Interpretation None  MDM   Final diagnoses:  None    Patient presents to emergency department out of concern for shortness of breath. She has been diagnosed with pneumonia and is now hypoxic to 80% on room air.  Patient likely failed outpatient therapy for treatment. She has not been hospitalized since January 2014, she was given ceftriaxone IV. Labs and chest x-ray are currently pending. Patient will have to be admitted for hypoxic pneumonia. Please see the oncoming provider's note for ultimate disposition.  CRITICAL CARE Performed by: Everlene Balls   Total critical care time: 35 min  Critical care time was exclusive of separately billable procedures and treating other patients.  Critical care was necessary to treat or prevent imminent or life-threatening deterioration.  Critical care was time spent personally by me on the following activities: development of treatment plan with patient and/or surrogate as well as nursing, discussions with consultants, evaluation of patient's response to treatment, examination of patient, obtaining history from patient or surrogate, ordering and performing treatments and interventions, ordering and review of laboratory studies, ordering and review of radiographic studies, pulse oximetry and  re-evaluation of patient's condition.   Everlene Balls, MD 07/02/14 0700  Everlene Balls, MD 07/02/14 [phone]

## 2014-07-02 NOTE — ED Notes (Signed)
Pt given bedside commode

## 2014-07-02 NOTE — Progress Notes (Signed)
INITIAL NUTRITION ASSESSMENT  DOCUMENTATION CODES Per approved criteria  -Severe malnutrition in the context of chronic illness -Underweight   INTERVENTION: -Resource Breeze po TID, each supplement provides 250 kcal and 9 grams of protein -Patient educated on a low potassium diet and provided with low-moderate potassium foods to maximize food choices. We also discussed renal friendly protein/nutrition supplement options at home and discussed ways to increase calorie intake.   NUTRITION DIAGNOSIS: Malnutrition related to CKD as evidenced by 14% weight loss in 1 year and severe muscle and fat depletion.   Goal: Patient will meet >/=90% of estimated nutrition needs  Monitor:  PO intake, weight, labs, I/Os  Reason for Assessment: Consult  53 y.o. female  Admitting Dx: Shortness of breath  ASSESSMENT: 53 year old woman with PMH of COPD, CKD stage 3, bipolar disorder, schizophrenia, chronic generalized abdominal pain, HTN, who [facility] ED for evaluation of shortness of breath. She has had shortness of breath for 2 weeks now with cough productive of clear/yellow sputum and wheezing as well as subjective fever.   Patient with a BMI of 18.1. She reports that her appetite has been fair due to CKD. She does try to eat regularly, but struggles to get enough food with limitations on potassium intake. Potassium currently WNL, but she states that she has had issues with high potassium.   She has lost 14% of her usual weight in the last 1-2 years, which is not significant over the time frame, but she does have evidence of severe fat and muscle depletion.   Nutrition Focused Physical Exam:  Subcutaneous Fat:  Orbital Region: moderate depletion Upper Arm Region: severe depletion Thoracic and Lumbar Region: severe depletion  Muscle:  Temple Region: moderate depletion Clavicle Bone Region: severe depletion Clavicle and Acromion Bone Region: severe depletion Scapular Bone Region:  severe depletion Dorsal Hand: severe depletion Patellar Region: severe depletion Anterior Thigh Region: severe depletion Posterior Calf Region: severe depletion  Edema: not present   Height: Ht Readings from Last 1 Encounters:  07/02/14 5\' 1"  (1.549 m)    Weight: Wt Readings from Last 1 Encounters:  07/02/14 95 lb 10.9 oz (43.4 kg)    Ideal Body Weight: 105 pounds  % Ideal Body Weight: 90%  Wt Readings from Last 10 Encounters:  07/02/14 95 lb 10.9 oz (43.4 kg)  06/28/14 93 lb (42.185 kg)  10/07/12 109 lb 5.6 oz (49.6 kg)  08/26/12 112 lb 4 oz (50.916 kg)  08/10/08 124 lb 2.1 oz (56.306 kg)    Usual Body Weight: 110 pounds  % Usual Body Weight: 86%  BMI:  Body mass index is 18.09 kg/(m^2). Patient is underweight.   Estimated Nutritional Needs: Kcal: 1400-1500 kcal Protein: 50-60 g Fluid: >1.5 L/day  Skin: intact  Diet Order: Renal  EDUCATION NEEDS: -Education needs addressed   Intake/Output Summary (Last 24 hours) at 07/02/14 1529 Last data filed at 07/02/14 1300  Gross per 24 hour  Intake    360 ml  Output      5 ml  Net    355 ml    Last BM: PTA   Labs:   Recent Labs Lab 06/28/14 1510 07/02/14 0655 07/02/14 0709  NA 134* 131* 134*  K 5.2 3.9 4.0  CL 95* 94* 102  CO2 28 24  --   BUN 23 42* 39*  CREATININE 1.57* 1.56* 1.60*  CALCIUM 9.6 9.0  --   GLUCOSE 80 123* 126*    CBG (last 3)  No  results found for this basename: GLUCAP,  in the last 72 hours  Scheduled Meds: . amLODipine  5 mg Oral Daily  . cholecalciferol  1,000 Units Oral Daily  . gabapentin  400 mg Oral TID AC  . gabapentin  1,200 mg Oral QHS  . heparin  5,000 Units Subcutaneous 3 times per day  . [START ON 07/03/2014] Influenza vac split quadrivalent PF  0.5 mL Intramuscular Tomorrow-1000  . metoprolol  50 mg Oral BID  . mometasone-formoterol  2 puff Inhalation BID  . nicotine  21 mg Transdermal Daily  . pantoprazole  40 mg Oral Daily  . prazosin  2 mg Oral QHS  .  predniSONE  20 mg Oral BID  . sodium chloride  3 mL Intravenous Q12H  . sodium chloride  3 mL Intravenous Q12H  . traMADol  50 mg Oral Q8H  . [START ON 07/03/2014] venlafaxine XR  300 mg Oral Q breakfast    Continuous Infusions:   Past Medical History  Diagnosis Date  . COPD (chronic obstructive pulmonary disease) 2000  . Carpal tunnel syndrome   . Diverticulitis 2008  . Pancreatitis 10/2011  . Schizophrenia   . RLS (restless legs syndrome)   . GERD (gastroesophageal reflux disease) 2008  . Bipolar 1 disorder   . Small bowel obstruction 2008  . Cervical cancer 1985  . History of colon polyps 2009  . Anemia 2008  . Hypertension 2013  . Headache(784.0)   . Anxiety   . Asthma   . Arthritis 2000  . Depression 2000  . HLD (hyperlipidemia) 2013  . IBS (irritable bowel syndrome) 2008  . Shortness of breath   . Renal disorder     Past Surgical History  Procedure Laterality Date  . Colon surgery  2008    6 inches of colon removed due to obstruction  . Partial hysterectomy  1995  . Total abdominal hysterectomy  1996    Larey Seat, RD, LDN Pager #: [phone] After-Hours Pager #: [phone]

## 2014-07-02 NOTE — ED Notes (Signed)
Pt EKG results given to Dr. Claudine Mouton

## 2014-07-02 NOTE — Care Management Note (Signed)
07/02/2014 Noted referral for assistance with medications, however this pt has Medicaid , therefore CM is unable to assist with medications.   Will follow for other d/c needs.   Jasmine Pang RN MPH, case manager, [phone]

## 2014-07-02 NOTE — ED Notes (Signed)
Patient with shortness of breath and chest pain.  Patient was seen earlier yesterday.  Patient with oxygen sats in 80's.

## 2014-07-03 ENCOUNTER — Inpatient Hospital Stay (HOSPITAL_COMMUNITY): Payer: Medicaid Other

## 2014-07-03 DIAGNOSIS — N183 Chronic kidney disease, stage 3 (moderate): Secondary | ICD-10-CM

## 2014-07-03 DIAGNOSIS — Z720 Tobacco use: Secondary | ICD-10-CM

## 2014-07-03 DIAGNOSIS — F419 Anxiety disorder, unspecified: Secondary | ICD-10-CM

## 2014-07-03 DIAGNOSIS — F209 Schizophrenia, unspecified: Secondary | ICD-10-CM

## 2014-07-03 DIAGNOSIS — R0602 Shortness of breath: Secondary | ICD-10-CM

## 2014-07-03 DIAGNOSIS — R0902 Hypoxemia: Secondary | ICD-10-CM

## 2014-07-03 DIAGNOSIS — D649 Anemia, unspecified: Secondary | ICD-10-CM

## 2014-07-03 DIAGNOSIS — I129 Hypertensive chronic kidney disease with stage 1 through stage 4 chronic kidney disease, or unspecified chronic kidney disease: Secondary | ICD-10-CM

## 2014-07-03 DIAGNOSIS — R109 Unspecified abdominal pain: Secondary | ICD-10-CM

## 2014-07-03 DIAGNOSIS — F319 Bipolar disorder, unspecified: Secondary | ICD-10-CM

## 2014-07-03 DIAGNOSIS — E43 Unspecified severe protein-calorie malnutrition: Secondary | ICD-10-CM

## 2014-07-03 DIAGNOSIS — J449 Chronic obstructive pulmonary disease, unspecified: Secondary | ICD-10-CM

## 2014-07-03 LAB — BASIC METABOLIC PANEL
Anion gap: 14 (ref 5–15)
BUN: 45 mg/dL — AB (ref 6–23)
CHLORIDE: 95 meq/L — AB (ref 96–112)
CO2: 27 meq/L (ref 19–32)
CREATININE: 1.87 mg/dL — AB (ref 0.50–1.10)
Calcium: 8.9 mg/dL (ref 8.4–10.5)
GFR calc Af Amer: 35 mL/min — ABNORMAL LOW (ref >90)
GFR calc non Af Amer: 30 mL/min — ABNORMAL LOW (ref >90)
Glucose, Bld: 147 mg/dL — ABNORMAL HIGH (ref 70–99)
Potassium: 5 mEq/L (ref 3.7–5.3)
Sodium: 136 mEq/L — ABNORMAL LOW (ref 137–147)

## 2014-07-03 LAB — CBC
HCT: 27 % — ABNORMAL LOW (ref 36.0–46.0)
HEMOGLOBIN: 8.6 g/dL — AB (ref 12.0–15.0)
MCH: 26.5 pg (ref 26.0–34.0)
MCHC: 31.9 g/dL (ref 30.0–36.0)
MCV: 83.3 fL (ref 78.0–100.0)
Platelets: 538 10*3/uL — ABNORMAL HIGH (ref 150–400)
RBC: 3.24 MIL/uL — AB (ref 3.87–5.11)
RDW: 15.9 % — ABNORMAL HIGH (ref 11.5–15.5)
WBC: 9.7 10*3/uL (ref 4.0–10.5)

## 2014-07-03 LAB — TROPONIN I: Troponin I: <0.3 ng/mL (ref <0.30)

## 2014-07-03 LAB — RETICULOCYTES
RBC.: 3.54 MIL/uL — ABNORMAL LOW (ref 3.87–5.11)
Retic Count, Absolute: 123.9 10*3/uL (ref 19.0–186.0)
Retic Ct Pct: 3.5 % — ABNORMAL HIGH (ref 0.4–3.1)

## 2014-07-03 MED ORDER — ASPIRIN 81 MG PO CHEW
324.0000 mg | CHEWABLE_TABLET | Freq: Once | ORAL | Status: AC
  Administered 2014-07-03: 324 mg via ORAL
  Filled 2014-07-03: qty 4

## 2014-07-03 MED ORDER — AMLODIPINE BESYLATE 10 MG PO TABS
10.0000 mg | ORAL_TABLET | Freq: Every day | ORAL | Status: DC
  Administered 2014-07-04: 10 mg via ORAL
  Filled 2014-07-03: qty 1

## 2014-07-03 MED ORDER — AMLODIPINE BESYLATE 5 MG PO TABS
5.0000 mg | ORAL_TABLET | Freq: Once | ORAL | Status: AC
  Administered 2014-07-03: 5 mg via ORAL
  Filled 2014-07-03 (×2): qty 1

## 2014-07-03 NOTE — Plan of Care (Signed)
Problem: Phase I Progression Outcomes Goal: First antibiotic given within 6hrs of admit Outcome: Completed/Met Date Met:  07/03/14 07/02/14 see eMAR Goal: Confirm chest x-ray completed Outcome: Completed/Met Date Met:  07/03/14 07/02/14

## 2014-07-03 NOTE — Progress Notes (Signed)
Pt complains of CP 8/10 that started about an hour ago and is constant. VS 98.4-70-19-187/86. O2 sat. Is 100% on 2L nasal canula. Rapid response nurse notified. MD notified. Orders received.

## 2014-07-03 NOTE — Progress Notes (Signed)
Subjective: Feels some weak ness today. States that SOB and cough is better. Felt that she didn't get better from outpatient treatment for her PNA (with zeepac, prednisone). States that her SOB is related to lying flat at night. Admits to weight loss without trying. States her anemia has been looked at by PCP but thinks nothing was done about it. Denies chest pain, fever, chills, n/v.   Objective: Vital signs in last 24 hours: Filed Vitals:   07/03/14 0402 07/03/14 0500 07/03/14 0606 07/03/14 1025  BP: 175/77  156/78 191/92  Pulse: 59  59 64  Temp:   98.3 F (36.8 C) 98.3 F (36.8 C)  TempSrc:    Oral  Resp:   19 20  Height:      Weight:  90 lb 11.2 oz (41.141 kg)    SpO2:   100% 96%   Weight change:   Intake/Output Summary (Last 24 hours) at 07/03/14 1507 Last data filed at 07/03/14 1027  Gross per 24 hour  Intake   1040 ml  Output      0 ml  Net   1040 ml   Vitals reviewed. General: resting in bed, NAD HEENT: PERRL, EOMI, no scleral icterus Cardiac: RRR, no rubs, murmurs or gallops Pulm: clear to auscultation bilaterally, no wheezes, trace crackles on bases.  Abd: soft, nontender, nondistended, BS present Ext: warm and well perfused, no pedal edema Neuro: alert and oriented X3, cranial nerves II-XII grossly intact, strength and sensation to light touch equal in bilateral upper and lower extremities  Lab Results: Basic Metabolic Panel:  Recent Labs Lab 07/02/14 0655 07/02/14 0709 07/03/14 0412  NA 131* 134* 136*  K 3.9 4.0 5.0  CL 94* 102 95*  CO2 24  --  27  GLUCOSE 123* 126* 147*  BUN 42* 39* 45*  CREATININE 1.56* 1.60* 1.87*  CALCIUM 9.0  --  8.9   Liver Function Tests:  Recent Labs Lab 06/28/14 1510  AST 19  ALT 15  ALKPHOS 99  BILITOT <0.2*  PROT 7.2  ALBUMIN 3.2*   No results found for this basename: LIPASE, AMYLASE,  in the last 168 hours No results found for this basename: AMMONIA,  in the last 168 hours CBC:  Recent Labs Lab  07/02/14 0655 07/02/14 0709 07/03/14 0412  WBC 19.0*  --  9.7  NEUTROABS 17.5*  --   --   HGB 8.7* 9.9* 8.6*  HCT 26.9* 29.0* 27.0*  MCV 86.8  --  83.3  PLT 606*  --  538*   Cardiac Enzymes: No results found for this basename: CKTOTAL, CKMB, CKMBINDEX, TROPONINI,  in the last 168 hours BNP:  Recent Labs Lab 07/02/14 0655  PROBNP 27552.0*   D-Dimer: No results found for this basename: DDIMER,  in the last 168 hours CBG: No results found for this basename: GLUCAP,  in the last 168 hours Hemoglobin A1C: No results found for this basename: HGBA1C,  in the last 168 hours Fasting Lipid Panel: No results found for this basename: CHOL, HDL, LDLCALC, TRIG, CHOLHDL, LDLDIRECT,  in the last 168 hours Thyroid Function Tests: No results found for this basename: TSH, T4TOTAL, FREET4, T3FREE, THYROIDAB,  in the last 168 hours Coagulation: No results found for this basename: LABPROT, INR,  in the last 168 hours Anemia Panel: No results found for this basename: VITAMINB12, FOLATE, FERRITIN, TIBC, IRON, RETICCTPCT,  in the last 168 hours Urine Drug Screen: Drugs of Abuse     Component Value Date/Time  LABOPIA NONE DETECTED 05/12/2013 1536   COCAINSCRNUR NONE DETECTED 05/12/2013 1536   LABBENZ NONE DETECTED 05/12/2013 1536   AMPHETMU NONE DETECTED 05/12/2013 1536   THCU POSITIVE* 05/12/2013 1536   LABBARB NONE DETECTED 05/12/2013 1536    Alcohol Level: No results found for this basename: ETH,  in the last 168 hours Urinalysis:  Recent Labs Lab 06/28/14 1518  COLORURINE YELLOW  LABSPEC 1.012  PHURINE 6.0  GLUCOSEU NEGATIVE  HGBUR NEGATIVE  BILIRUBINUR NEGATIVE  KETONESUR NEGATIVE  PROTEINUR NEGATIVE  UROBILINOGEN 0.2  NITRITE NEGATIVE  [place]. Labs:  Micro Results: Recent Results (from the past 240 hour(s))  CULTURE, BLOOD (ROUTINE X 2)     Status: None   Collection Time    07/02/14  7:42 AM      Result Value Ref Range Status   Specimen Description  BLOOD RIGHT FOREARM   Final   Special Requests BOTTLES DRAWN AEROBIC AND ANAEROBIC 5CC   Final   Culture  Setup Time     Final   Value: 07/02/2014 12:35     Performed at [facility]   Culture     Final   Value:        BLOOD CULTURE RECEIVED NO GROWTH TO DATE CULTURE WILL BE HELD FOR 5 DAYS BEFORE ISSUING A FINAL NEGATIVE REPORT     Performed at [facility]   Report Status PENDING   Incomplete  CULTURE, BLOOD (ROUTINE X 2)     Status: None   Collection Time    07/02/14  7:48 AM      Result Value Ref Range Status   Specimen Description BLOOD LEFT ANTECUBITAL   Final   Special Requests     Final   Value: BOTTLES DRAWN AEROBIC AND ANAEROBIC 5CC BLUE, 2CC RED   Culture  Setup Time     Final   Value: 07/02/2014 12:35     Performed at [facility]   Culture     Final   Value:        BLOOD CULTURE RECEIVED NO GROWTH TO DATE CULTURE WILL BE HELD FOR 5 DAYS BEFORE ISSUING A FINAL NEGATIVE REPORT     Performed at [facility]   Report Status PENDING   Incomplete   Studies/Results: Dg Chest 2 View  07/02/2014   CLINICAL DATA:  Short of breath.  Cough.  Evaluate pneumonia.  EXAM: CHEST  2 VIEW  COMPARISON:  06/28/2014.  FINDINGS: Mild cardiomegaly. New basilar opacity with interstitial and alveolar airspace disease consistent with pulmonary edema. New bilateral pleural effusions. Basilar atelectasis. Old RIGHT clavicle fracture noted. Given the interstitial opacity and alveolar opacity with pleural effusions, the findings are most compatible with CHF.  IMPRESSION: Mild to moderate CHF.   Electronically Signed   By: Dereck Ligas M.D.   On: 07/02/2014 07:19   Medications: I have reviewed the patient's current medications. Scheduled Meds: . [START ON 07/04/2014] amLODipine  10 mg Oral Daily  . amLODipine  5 mg Oral Once  . cholecalciferol  1,000 Units Oral Daily  . feeding supplement (RESOURCE BREEZE)  1 Container Oral TID BM  . gabapentin  400 mg Oral TID AC  .  gabapentin  1,200 mg Oral QHS  . heparin  5,000 Units Subcutaneous 3 times per day  . metoprolol  50 mg Oral BID  . mometasone-formoterol  2 puff Inhalation BID  . nicotine  21 mg Transdermal Daily  . pantoprazole  40 mg  Oral Daily  . prazosin  2 mg Oral QHS  . predniSONE  20 mg Oral BID  . ramelteon  8 mg Oral QHS  . sodium chloride  3 mL Intravenous Q12H  . sodium chloride  3 mL Intravenous Q12H  . traMADol  50 mg Oral Q8H  . venlafaxine XR  300 mg Oral Q breakfast   Continuous Infusions:  PRN Meds:.sodium chloride, acetaminophen, acetaminophen, albuterol, ALPRAZolam, ipratropium, ondansetron (ZOFRAN) IV, ondansetron, sodium chloride Assessment/Plan: Principal Problem:   Shortness of breath Active Problems:   Anxiety state   Abdominal pain, generalized   FECAL OCCULT BLOOD   Chronic disease anemia   COPD (chronic obstructive pulmonary disease)   CKD (chronic kidney disease) stage 3, GFR 30-59 ml/min   Affective bipolar disorder   Schizophrenia   Tobacco use   Protein-calorie malnutrition, severe   53 yo female with COPD, CKD, here with SOB unsure if from CHF or PNA.   SOB -could be from CHF or PNA. Had BLE edema and CXR showing some opacities on bilateral bases.  Obtained 2D ECHO showing EF 55-60%, MR regur, mod LA dilation. Received 40mg  IV lasix 2x yesterday. Today no BLE edema, lungs sound clear, breathing is better today. This improvement is consistent with 5 lb weight loss since admission which would suggest volume overload and CHF. - will repeat CXR today to see if bases cleared up which would point out to CHF as the source of SOB. Otherwise, we would lean towards PNA as the source and perhaps start abx. - monitor weight, strict i/o - Duoneb/dulera, ISS.  HTN - BP remains in 190's.  - is on amlodopine 5mg  and metoprolol 50mg  BID at home.  - she might be chronically high. Increased amlodopine to 10mg  today and continuing metoprolol 50mg  BID. - avoid acei/arbs. Will  not treat aggressively. May give hydralazine if >200 consistently.  CKD stage 3 - unclear baseline. Crt was 0.9 around 08/2013, bumped up after AceI, has been around 1.8 new baseline since then. Followed by Dr. Jimmy Footman. Currently Crt ~1.6-1.8.  - will have her follow up with Dr. Jimmy Footman upon discharge. Will monitor kidney function to make sure it's not worsening -avoid ACEI/ARBs.  Severe Malnutrition / Weight loss - had lost weight from 114 lb to 95lb without trying. Multifactorial, could be from CKD, COPD, malnutrition, or malignancy. Doesn't feel like eating much. - needs to be followed closely by PCP outpatient. - had been followed by GI closely with regular colonoscopies, last 2-3 years ago per patient with polyp removal. - resource breeze TID, appreciate nutrition recs.   Normocytic Anemia unclear if acute vs chronic -baseline hgb unclear since onset of CKD. Hgb was ~12 1 year ago but has been low per patient.  Currently hgb ~8.6. Denies any bloody stool or hempotysis. FOBT positive. Has been followed up by GI outpatient. Has hx of diverticulutis, had colon anastomosis. Last cx 2-3 years ago per patient with some polyps. Will need to be addressed by PCP.  - have ordered anemia panel here.f/up.  COPD -  -finished 5 days prednisone today.  - cont duonebs/ dulera (advair at home). ISS, tobacco cessation.   Chronic generalized ab pain - had extensive workup by PCP.  - Continue home tramadol 50mg  q8hr PRN for pain  -Continue gabapentin 400mg  TID ac and 1200mg  qHS.  -Pt will need pain clinic referral from her PCP after her discharge  Anxiety - Continue home Xanax (this tx was confirmed by her PCP).  -Xanax  0.5mg  daily PRN for anxiety  Bipolar disorder - On Effexor 150mg  24 hr at home which was continued. She is not on a mood stabilizer per her PCP's note but is followed by psychiatry.   Schizophrenia - Stable. Reported in her chart but not on antipsychotic. Followed by psychiatry as  outpt.   Tobacco use disorder- Has 40 pack-year smoking history.  -Continue counseling on smoking cessation  -nicotine patch 21mg  daily  DVT prophylaxis: Heparin [place] TID  Dispo: Disposition is deferred at this time, awaiting improvement of current medical problems.  Anticipated discharge in approximately 2-3 day(s).   The patient does have a current PCP Leamon Arnt, MD) and does need an [facility] hospital follow-up appointment after discharge.  The patient does not have transportation limitations that hinder transportation to clinic appointments.  .Services Needed at time of discharge: Y = Yes, Blank = No PT:   OT:   RN:   Equipment:   Other:     LOS: 1 day   Dellia Nims, MD 07/03/2014, 3:07 PM

## 2014-07-03 NOTE — Progress Notes (Signed)
Pts daughter concerned that pts "dose of neurontin is too high for her body weight." Daughter is also requesting a 3-in-1 bedside commode as pt sometimes has "difficulty making it to the bathroom in time." Will continue to monitor.

## 2014-07-03 NOTE — Progress Notes (Signed)
[name], female   DOB: 1961/01/05, 53 y.o.   MRN: [id number]        Attending progress note    Date of Admission:  07/02/2014     Principal Problem:   Shortness of breath Active Problems:   Anxiety state   Abdominal pain, generalized   FECAL OCCULT BLOOD   Chronic disease anemia   COPD (chronic obstructive pulmonary disease)   CKD (chronic kidney disease) stage 3, GFR 30-59 ml/min   Affective bipolar disorder   Schizophrenia   Tobacco use   Protein-calorie malnutrition, severe   . [START ON 07/04/2014] amLODipine  10 mg Oral Daily  . amLODipine  5 mg Oral Once  . cholecalciferol  1,000 Units Oral Daily  . feeding supplement (RESOURCE BREEZE)  1 Container Oral TID BM  . gabapentin  400 mg Oral TID AC  . gabapentin  1,200 mg Oral QHS  . heparin  5,000 Units Subcutaneous 3 times per day  . metoprolol  50 mg Oral BID  . mometasone-formoterol  2 puff Inhalation BID  . nicotine  21 mg Transdermal Daily  . pantoprazole  40 mg Oral Daily  . prazosin  2 mg Oral QHS  . predniSONE  20 mg Oral BID  . ramelteon  8 mg Oral QHS  . sodium chloride  3 mL Intravenous Q12H  . sodium chloride  3 mL Intravenous Q12H  . traMADol  50 mg Oral Q8H  . venlafaxine XR  300 mg Oral Q breakfast    I have seen and examined [name] with Dr. Heber [place] and our team. [name] probably has multifactorial shortness of breath with components of COPD, community-acquired pneumonia and CHF from diastolic dysfunction. She is feeling better today after one dose of antibiotics and diuresis. She is reported to have chronic kidney disease but looking at recent labs it appears that she had some acute kidney injury while on an ACE inhibitor. Her creatinine returned to normal but she now has acute renal insufficiency with this illness. I also note that she has significant unintentional weight loss. Records indicate that she has lost 24 pounds since January of 2014. She's also developed a new  normocytic anemia. These will need close outpatient followup. We will repeat a chest x-ray today to see if her infiltrates have cleared significantly suggesting that diastolic heart failure was the major problem.  Michel Bickers, MD [facility] [phone] pager   [phone] cell 07/03/2014, 2:10 PM

## 2014-07-04 DIAGNOSIS — I509 Heart failure, unspecified: Secondary | ICD-10-CM

## 2014-07-04 DIAGNOSIS — D509 Iron deficiency anemia, unspecified: Secondary | ICD-10-CM

## 2014-07-04 LAB — IRON AND TIBC
Iron: 46 ug/dL (ref 42–135)
Saturation Ratios: 9 % — ABNORMAL LOW (ref 20–55)
TIBC: 496 ug/dL — AB (ref 250–470)
UIBC: 450 ug/dL — ABNORMAL HIGH (ref 125–400)

## 2014-07-04 LAB — FERRITIN: FERRITIN: 20 ng/mL (ref 10–291)

## 2014-07-04 MED ORDER — AMLODIPINE BESYLATE 10 MG PO TABS: 10.0000 mg | ORAL_TABLET | Freq: Every day | ORAL | Status: DC

## 2014-07-04 MED ORDER — OXYCODONE-ACETAMINOPHEN 5-325 MG PO TABS
1.0000 | ORAL_TABLET | Freq: Once | ORAL | Status: AC
  Administered 2014-07-04: 1 via ORAL
  Filled 2014-07-04: qty 1

## 2014-07-04 MED ORDER — FERROUS SULFATE 325 (65 FE) MG PO TABS
325.0000 mg | ORAL_TABLET | Freq: Three times a day (TID) | ORAL | Status: DC
  Filled 2014-07-04 (×2): qty 1

## 2014-07-04 MED ORDER — BOOST / RESOURCE BREEZE PO LIQD: 1.0000 | Freq: Three times a day (TID) | ORAL | Status: DC

## 2014-07-04 MED ORDER — DOCUSATE SODIUM 100 MG PO CAPS
100.0000 mg | ORAL_CAPSULE | Freq: Two times a day (BID) | ORAL | Status: DC
  Administered 2014-07-04: 100 mg via ORAL
  Filled 2014-07-04: qty 1

## 2014-07-04 MED ORDER — DSS 100 MG PO CAPS: 100.0000 mg | ORAL_CAPSULE | Freq: Two times a day (BID) | ORAL | Status: DC

## 2014-07-04 MED ORDER — FERROUS SULFATE 325 (65 FE) MG PO TABS: 325.0000 mg | ORAL_TABLET | Freq: Three times a day (TID) | ORAL | Status: DC

## 2014-07-04 MED ORDER — GABAPENTIN 400 MG PO CAPS: 400.0000 mg | ORAL_CAPSULE | Freq: Two times a day (BID) | ORAL | Status: DC

## 2014-07-04 NOTE — Evaluation (Signed)
Physical Therapy Evaluation/ discharge Patient Details Name: Lacheryl Raney MRN: [id number] DOB: 1961/04/22 Today's Date: 07/04/2014   History of Present Illness  Ms. Dicarlo is a 53 year old woman with PMH of COPD, CKD stage 3, bipolar disorder, schizophrenia, chronic generalized abdominal pain, HTN, who presents to the [facility] ED for evaluation of shortness of breath. She has had shortness of breath for 2 weeks now with cough productive of clear/yellow sputum and wheezing as well as subjective fever. She was treated for bilateral lower lobe pneumonia at [place] with Levaquin which she did not take due to her own report of having myasthenia gravis (no formal diagnosis in her chart per PCP).   Clinical Impression  Pt moving well with rapid gait speed, no CP, no SOB and able to maintain sats 97% throughout the entire time on RA. Pt able to don socks EOB, no deviations or LOB with gait and no further therapy needs with pt aware and agreeable.     Follow Up Recommendations No PT follow up    Equipment Recommendations  None recommended by PT    Recommendations for Other Services       Precautions / Restrictions Precautions Precautions: None      Mobility  Bed Mobility Overal bed mobility: Independent                Transfers Overall transfer level: Independent                  Ambulation/Gait Ambulation/Gait assistance: Independent Ambulation Distance (Feet): 500 Feet Assistive device: None Gait Pattern/deviations: WFL(Within Functional Limits)   Gait velocity interpretation: >2.62 ft/sec, indicative of independent community ambulator    Stairs Stairs: Yes Stairs assistance: Modified independent (Device/Increase time) Stair Management: One rail Left;Alternating pattern;Forwards Number of Stairs: 5    Wheelchair Mobility    Modified Rankin (Stroke Patients Only)       Balance Overall balance assessment: No apparent balance deficits (not formally  assessed)                                           Pertinent Vitals/Pain Pain Assessment: No/denies pain    Home Living Family/patient expects to be discharged to:: Private residence Living Arrangements: Spouse/significant other Available Help at Discharge: Family;Available 24 hours/day Type of Home: Mobile home Home Access: Stairs to enter Entrance Stairs-Rails: Right Entrance Stairs-Number of Steps: 4 Home Layout: One level Home Equipment: None      Prior Function Level of Independence: Independent               Hand Dominance        Extremity/Trunk Assessment   Upper Extremity Assessment: Overall WFL for tasks assessed           Lower Extremity Assessment: Overall WFL for tasks assessed      Cervical / Trunk Assessment: Normal  Communication   Communication: No difficulties  Cognition Arousal/Alertness: Awake/alert Behavior During Therapy: WFL for tasks assessed/performed Overall Cognitive Status: Within Functional Limits for tasks assessed                      General Comments      Exercises        Assessment/Plan    PT Assessment Patent does not need any further PT services  PT Diagnosis Other (comment) (Pt referred to therapy for Shortness of breath)  PT Problem List    PT Treatment Interventions     PT Goals (Current goals can be found in the Care Plan section) Acute Rehab PT Goals PT Goal Formulation: No goals set, d/c therapy    Frequency     Barriers to discharge        Co-evaluation               End of Session   Activity Tolerance: Patient tolerated treatment well Patient left: in bed;with call bell/phone within reach Nurse Communication: Mobility status         Time: [id number] PT Time Calculation (min): 7 min   Charges:   PT Evaluation $Initial PT Evaluation Tier I: 1 Procedure     PT G CodesMelford Aase 07/04/2014, 2:02 PM Elwyn Reach,  [place]

## 2014-07-04 NOTE — Progress Notes (Signed)
Sitting at the bedside,patient's baseline room air oxygen saturation is100%,ambulated along the entire length of the hallway to and from her room,patient able to maintained room air saturation while walking at 95%-100%,no chest pain,no dizziness, not in distress.

## 2014-07-04 NOTE — Progress Notes (Signed)
Called per floor RN at 2020 for pt with 8/10 chest pain. Unable to see right away. RN advised to get a set of VS, EKG and notify MD for orders. Suggesting labs, aspirin, NTG, and/or morphine and evaluation of EKG from MD. Upon my arrival at 2045 pt already received aspirin chewable, MD called updated on pt status per RN and troponin lab ordered. Pt alert oriented resting comfortable in bed. Denies chest pain but complains of some mild anxiety. Rn encouraged to give xanax prn with evening meds and monitor troponin lab. BP elevated, per RN bp has been elevated for her entire hospital stay, MD aware and no orders concerning bp at this time. Advised to call myself or provider for further concerns.

## 2014-07-04 NOTE — Discharge Instructions (Signed)
Please Pick up and start taking Iron pills, take 325mg  of Iron three times a day.  This may cause some constipation so please start taking 100mg  of Colace twice a day to prevent constipation. I want you to increase Amlodipine to 10mg  a day Please resume your hydrochlorothiazide. It is very important to follow up with you PCP in the next 1-2 weeks, I also want you to see your GI doctor and kidney doctor soon as well.

## 2014-07-04 NOTE — Discharge Summary (Signed)
Name: [name] MRN: [id number] DOB: 03-Dec-1960 53 y.o. PCP: Leamon Arnt, MD  Date of Admission: 07/02/2014  6:36 AM Date of Discharge: 07/04/2014 Attending Physician: Michel Bickers, MD  Discharge Diagnosis: Principal Problem:   Heart failure with preserved ejection fraction Active Problems:   Anxiety state   Abdominal pain, generalized   FECAL OCCULT BLOOD   Anemia, iron deficiency   COPD (chronic obstructive pulmonary disease)   CKD (chronic kidney disease) stage 3, GFR 30-59 ml/min   Affective bipolar disorder   Schizophrenia   Tobacco use   Protein-calorie malnutrition, severe  Discharge Medications:   Medication List    STOP taking these medications       predniSONE 20 MG tablet  Commonly known as:  DELTASONE      TAKE these medications       albuterol 108 (90 BASE) MCG/ACT inhaler  Commonly known as:  PROVENTIL HFA;VENTOLIN HFA  Inhale 2 puffs into the lungs 2 (two) times daily as needed (asthma).     ALPRAZolam 0.5 MG tablet  Commonly known as:  XANAX  Take 1 mg by mouth daily as needed for anxiety.     amLODipine 10 MG tablet  Commonly known as:  NORVASC  Take 1 tablet (10 mg total) by mouth daily.     cholecalciferol 1000 UNITS tablet  Commonly known as:  VITAMIN D  Take 1,000 Units by mouth daily.     DSS 100 MG Caps  Take 100 mg by mouth 2 (two) times daily.     feeding supplement (RESOURCE BREEZE) Liqd  Take 1 Container by mouth 3 (three) times daily between meals.     ferrous sulfate 325 (65 FE) MG tablet  Take 1 tablet (325 mg total) by mouth 3 (three) times daily with meals.     Fluticasone-Salmeterol 250-50 MCG/DOSE Aepb  Commonly known as:  ADVAIR  Inhale 1 puff into the lungs every 12 (twelve) hours.     gabapentin 400 MG capsule  Commonly known as:  NEURONTIN  Take 1 capsule (400 mg total) by mouth 2 (two) times daily. Patient takes 400 mg 3 times daily and 1200 mg at bedtime     hydrochlorothiazide 25 MG tablet    Commonly known as:  HYDRODIURIL  Take 25 mg by mouth daily.     metoprolol 50 MG tablet  Commonly known as:  LOPRESSOR  Take 50 mg by mouth 2 (two) times daily.     pantoprazole 40 MG tablet  Commonly known as:  PROTONIX  Take 40 mg by mouth daily.     prazosin 2 MG capsule  Commonly known as:  MINIPRESS  Take 2 mg by mouth at bedtime.     ranitidine 150 MG tablet  Commonly known as:  ZANTAC  Take 150 mg by mouth 2 (two) times daily.     venlafaxine XR 150 MG 24 hr capsule  Commonly known as:  EFFEXOR-XR  Take 300 mg by mouth daily with breakfast.        Disposition and follow-up:   Ms.[name] was discharged from [facility] in Stable condition.  At the hospital follow up visit please address:  1.  Symptoms of dyspnea (diuresed while inpatient, but did not start any home lasix).  Compliance with supplemental Iron.  Repeat Renal function.  2.  Labs / imaging needed at time of follow-up: BMP  3.  Pending labs/ test needing follow-up: None  Follow-up Appointments:   Discharge Instructions:  Discharge Instructions   (HEART FAILURE PATIENTS) Call MD:  Anytime you have any of the following symptoms: 1) 3 pound weight gain in 24 hours or 5 pounds in 1 week 2) shortness of breath, with or without a dry hacking cough 3) swelling in the hands, feet or stomach 4) if you have to sleep on extra pillows at night in order to breathe.    Complete by:  As directed      Call MD for:  difficulty breathing, headache or visual disturbances    Complete by:  As directed      Diet - low sodium heart healthy    Complete by:  As directed      Increase activity slowly    Complete by:  As directed            Consultations:    Procedures Performed:  Dg Chest 2 View  07/03/2014   CLINICAL DATA:  Recent hospitalization for pneumonia ; now with shortness of breath; history of CHF, COPD, and hypertension ; current tobacco use  EXAM: CHEST  2 VIEW  COMPARISON:  PA  and lateral chest of July 02, 2014  FINDINGS: The lungs are hyperinflated with hemidiaphragm flattening. There are small bilateral pleural effusions. The pulmonary interstitial markings have markedly improved since yesterday's study. The cardiopericardial silhouette is normal in size. The pulmonary vascularity is not engorged. The mediastinum is normal in width. There is an old ununited posttraumatic fracture of the midshaft of the right clavicle.  IMPRESSION: There has been marked improvement in the appearance of the pulmonary interstitium consistent with resolving CHF. There remain small bilateral pleural effusions. There is underlying COPD.   Electronically Signed   By: David  Martinique   On: 07/03/2014 15:26   Dg Chest 2 View  07/02/2014   CLINICAL DATA:  Short of breath.  Cough.  Evaluate pneumonia.  EXAM: CHEST  2 VIEW  COMPARISON:  06/28/2014.  FINDINGS: Mild cardiomegaly. New basilar opacity with interstitial and alveolar airspace disease consistent with pulmonary edema. New bilateral pleural effusions. Basilar atelectasis. Old RIGHT clavicle fracture noted. Given the interstitial opacity and alveolar opacity with pleural effusions, the findings are most compatible with CHF.  IMPRESSION: Mild to moderate CHF.   Electronically Signed   By: Dereck Ligas M.D.   On: 07/02/2014 07:19   Dg Chest 2 View  06/28/2014   ADDENDUM REPORT: 06/28/2014 18:22  ADDENDUM: There is an old healed fracture of the mid right clavicle.   Electronically Signed   By: Lowella Grip M.D.   On: 06/28/2014 18:22   06/28/2014   CLINICAL DATA:  Chest pain  EXAM: CHEST  2 VIEW  COMPARISON:  November 07, 2012  FINDINGS: There is patchy infiltrate in the inferior lingula. There is mild atelectasis in the right middle lobe. Elsewhere lungs are clear. The heart is slightly enlarged with pulmonary vascularity within normal limits. No adenopathy. No bone lesions.  IMPRESSION: Inferior lingular infiltrate. Atelectasis right middle  lobe. Heart is mildly enlarged.  Electronically Signed: By: Lowella Grip M.D. On: 06/28/2014 18:20    2D Echo: 07/02/14:Study Conclusions  - Left ventricle: The cavity size was normal. Wall thickness was normal. Systolic function was normal. The estimated ejection fraction was in the range of 55% to 60%. - Mitral valve: There was moderate regurgitation. - Left atrium: The atrium was moderately dilated. - Pulmonary arteries: PA peak pressure: 59 mm Hg (S). - Pericardium, extracardiac: There was a left pleural effusion.  Admission HPI: Ms. Kyles is a 53 year old woman with PMH of COPD, CKD stage 3, bipolar disorder, schizophrenia, chronic generalized abdominal pain, HTN, who presents to the [facility] ED for evaluation of shortness of breath. She has had shortness of breath for 2 weeks now with cough productive of clear/yellow sputum and wheezing as well as subjective fever. She was treated for bilateral lower lobe pneumonia at [place] with Levaquin which she did not take due to her own report of having myasthenia gravis (no formal diagnosis in her chart per PCP). Instead of the Levaquin she was given Zpack by her pharmacist which she finished on Tuesday. She felt somewhat better for a a few days while taking Zpack but worsened on Monday and was seen at the [facility] ED. She was given Azithromycin IV and Ceftriaxone IV during that ED visit and was discharged with prescription for prednisone, DuoNeb, and continuation of Zpack.  Her cough improved but her shortness of breath persisted and she returned to the ED today.  In the ED she was found to be hypoxic with O2 sats to 80s (though not charted), with tachypnea (RR of 25), and was started on oxygen supplementation. VBG was 7.4/41/64/26. Lactic acid 1.87. ProBNP elevated to [address]. CXR with no consolidation but mild cardiomegaly and moderate CHF. Nonetheless, she was started on Ceftriaxone IV for pneumonia. She was also found to be hypertensive with BP  of 194/87 and was given Lasix 40mg  IV once. The IMTS team was called for admission for further evaluation and treatment of her shortness of breath.    Hospital Course by problem list:   Heart failure with preserved ejection fraction Patient was admitted for dyspnea and reported a history of recently being treated for Pneumonia with azithromycin.  On admission her CXR showed vascular congestion consistent with acute congestive heart failure.  In addition her proBNP was elevated to 27k.  She was initially started on both treatment for CAP and CHF with IV antibiotics and IV Lasix.  She was monitored clinically and had good UOP and remained afebrile.  She responded quickly to the diuresis and a transthoracic echocardiogram showed that she had elevated pulmonary artery pressures despite normal LVEF.  She was continue with diuresis and a repeat CXR showed marked improvement consistent with resolving CHF.  She was able to maintain oxygenation without supplementation and was ambulated by nursing without hypoxia.  She was seen by physical therapy prior to discharge without any home health needs identified.    Anemia, iron deficiency -Patient has a history of anemia, she attributed this to her CKD however this was felt to be unlikely as her CKD appears to be stage 3.  Iron studies were obtained which revealed high TIBC low sat ratio and a ferritin of 20 c/w Iron deficiency anemia.  She was started on iron supplements, and encourage to take this 3 times a day.  She was started on a stool softener    COPD (chronic obstructive pulmonary disease) - She was already in the process of treatment with prednisone for a presumed COPD exacerbation.  We continued treatment to finish her 5 day course.  We continue her home medications, no wheezing was appreciated    CKD (chronic kidney disease) stage 3, GFR 30-59 ml/min - She has had progressive kidney disease and is now followed by Dr. Jimmy Footman.  Her renal function was  monitored with diuresis and her SCr was 1.8 on discharge.  Discharge Vitals:   BP 170/78  Pulse 72  Temp(Src) 98.5 F (36.9 C) (Oral)  Resp 20  Ht 5\' 1"  (1.549 m)  Wt 90 lb 11.2 oz (41.141 kg)  BMI 17.15 kg/m2  SpO2 97%  Discharge Labs:  Results for orders placed during the hospital encounter of 07/02/14 (from the past 24 hour(s))  IRON AND TIBC     Status: Abnormal   Collection Time    07/03/14  3:45 PM      Result Value Ref Range   Iron 46  42 - 135 ug/dL   TIBC 496 (*) 250 - 470 ug/dL   Saturation Ratios 9 (*) 20 - 55 %   UIBC 450 (*) 125 - 400 ug/dL  FERRITIN     Status: None   Collection Time    07/03/14  3:45 PM      Result Value Ref Range   Ferritin 20  10 - 291 ng/mL  RETICULOCYTES     Status: Abnormal   Collection Time    07/03/14  3:45 PM      Result Value Ref Range   Retic Ct Pct 3.5 (*) 0.4 - 3.1 %   RBC. 3.54 (*) 3.87 - 5.11 MIL/uL   Retic Count, Manual 123.9  19.0 - 186.0 K/uL  TROPONIN I     Status: None   Collection Time    07/03/14  8:36 PM      Result Value Ref Range   Troponin I <0.30  <0.30 ng/mL    Signed: Lucious Groves, DO 07/04/2014, 3:23 PM    Services Ordered on Discharge: none Equipment Ordered on Discharge: none

## 2014-07-04 NOTE — Progress Notes (Signed)
Subjective: Patient reports she feels much better today,  Reports her SOB has pretty much resolved. She has no acute complaints. Objective: Vital signs in last 24 hours: Filed Vitals:   07/03/14 2340 07/04/14 0450 07/04/14 0929 07/04/14 1400  BP: 186/83 158/76 170/78   Pulse: 70 67 66 72  Temp:  98.3 F (36.8 C) 98.5 F (36.9 C)   TempSrc:  Oral Oral   Resp:  18 20   Height:      Weight:      SpO2:  98% 98% 97%   Weight change:   Intake/Output Summary (Last 24 hours) at 07/04/14 1500 Last data filed at 07/03/14 1808  Gross per 24 hour  Intake    240 ml  Output      0 ml  Net    240 ml  General: resting in bed Cardiac: RRR, no rubs, murmurs or gallops Pulm: CTAB Abd: soft, nontender, nondistended, BS present Ext: warm and well perfused, no pedal edema Neuro: alert and oriented Lab Results: Basic Metabolic Panel:  Recent Labs Lab 07/02/14 0655 07/02/14 0709 07/03/14 0412  NA 131* 134* 136*  K 3.9 4.0 5.0  CL 94* 102 95*  CO2 24  --  27  GLUCOSE 123* 126* 147*  BUN 42* 39* 45*  CREATININE 1.56* 1.60* 1.87*  CALCIUM 9.0  --  8.9   Liver Function Tests:  Recent Labs Lab 06/28/14 1510  AST 19  ALT 15  ALKPHOS 99  BILITOT <0.2*  PROT 7.2  ALBUMIN 3.2*   No results found for this basename: LIPASE, AMYLASE,  in the last 168 hours No results found for this basename: AMMONIA,  in the last 168 hours CBC:  Recent Labs Lab 07/02/14 0655 07/02/14 0709 07/03/14 0412  WBC 19.0*  --  9.7  NEUTROABS 17.5*  --   --   HGB 8.7* 9.9* 8.6*  HCT 26.9* 29.0* 27.0*  MCV 86.8  --  83.3  PLT 606*  --  538*   Cardiac Enzymes:  Recent Labs Lab 07/03/14 2036  TROPONINI <0.30   BNP:  Recent Labs Lab 07/02/14 0655  PROBNP 27552.0*   D-Dimer: No results found for this basename: DDIMER,  in the last 168 hours CBG: No results found for this basename: GLUCAP,  in the last 168 hours Hemoglobin A1C: No results found for this basename: HGBA1C,  in the last  168 hours Fasting Lipid Panel: No results found for this basename: CHOL, HDL, LDLCALC, TRIG, CHOLHDL, LDLDIRECT,  in the last 168 hours Thyroid Function Tests: No results found for this basename: TSH, T4TOTAL, FREET4, T3FREE, THYROIDAB,  in the last 168 hours Coagulation: No results found for this basename: LABPROT, INR,  in the last 168 hours Anemia Panel:  Recent Labs Lab 07/03/14 1545  FERRITIN 20  TIBC 496*  IRON 46  RETICCTPCT 3.5*   Urine Drug Screen: Drugs of Abuse     Component Value Date/Time   LABOPIA NONE DETECTED 05/12/2013 1536   COCAINSCRNUR NONE DETECTED 05/12/2013 1536   LABBENZ NONE DETECTED 05/12/2013 1536   AMPHETMU NONE DETECTED 05/12/2013 1536   THCU POSITIVE* 05/12/2013 1536   LABBARB NONE DETECTED 05/12/2013 1536    Alcohol Level: No results found for this basename: ETH,  in the last 168 hours Urinalysis:  Recent Labs Lab 06/28/14 1518  COLORURINE YELLOW  LABSPEC 1.012  PHURINE 6.0  GLUCOSEU NEGATIVE  HGBUR NEGATIVE  BILIRUBINUR NEGATIVE  KETONESUR NEGATIVE  PROTEINUR NEGATIVE  UROBILINOGEN 0.2  NITRITE  NEGATIVE  LEUKOCYTESUR NEGATIVE    Micro Results: Recent Results (from the past 240 hour(s))  CULTURE, BLOOD (ROUTINE X 2)     Status: None   Collection Time    07/02/14  7:42 AM      Result Value Ref Range Status   Specimen Description BLOOD RIGHT FOREARM   Final   Special Requests BOTTLES DRAWN AEROBIC AND ANAEROBIC 5CC   Final   Culture  Setup Time     Final   Value: 07/02/2014 12:35     Performed at [facility]   Culture     Final   Value:        BLOOD CULTURE RECEIVED NO GROWTH TO DATE CULTURE WILL BE HELD FOR 5 DAYS BEFORE ISSUING A FINAL NEGATIVE REPORT     Performed at [facility]   Report Status PENDING   Incomplete  CULTURE, BLOOD (ROUTINE X 2)     Status: None   Collection Time    07/02/14  7:48 AM      Result Value Ref Range Status   Specimen Description BLOOD LEFT ANTECUBITAL   Final   Special Requests      Final   Value: BOTTLES DRAWN AEROBIC AND ANAEROBIC 5CC BLUE, 2CC RED   Culture  Setup Time     Final   Value: 07/02/2014 12:35     Performed at [facility]   Culture     Final   Value:        BLOOD CULTURE RECEIVED NO GROWTH TO DATE CULTURE WILL BE HELD FOR 5 DAYS BEFORE ISSUING A FINAL NEGATIVE REPORT     Performed at [facility]   Report Status PENDING   Incomplete   Studies/Results: Dg Chest 2 View  07/03/2014   CLINICAL DATA:  Recent hospitalization for pneumonia ; now with shortness of breath; history of CHF, COPD, and hypertension ; current tobacco use  EXAM: CHEST  2 VIEW  COMPARISON:  PA and lateral chest of July 02, 2014  FINDINGS: The lungs are hyperinflated with hemidiaphragm flattening. There are small bilateral pleural effusions. The pulmonary interstitial markings have markedly improved since yesterday's study. The cardiopericardial silhouette is normal in size. The pulmonary vascularity is not engorged. The mediastinum is normal in width. There is an old ununited posttraumatic fracture of the midshaft of the right clavicle.  IMPRESSION: There has been marked improvement in the appearance of the pulmonary interstitium consistent with resolving CHF. There remain small bilateral pleural effusions. There is underlying COPD.   Electronically Signed   By: David  Martinique   On: 07/03/2014 15:26   Medications: I have reviewed the patient's current medications. Scheduled Meds: . amLODipine  10 mg Oral Daily  . cholecalciferol  1,000 Units Oral Daily  . docusate sodium  100 mg Oral BID  . feeding supplement (RESOURCE BREEZE)  1 Container Oral TID BM  . ferrous sulfate  325 mg Oral TID WC  . gabapentin  400 mg Oral TID AC  . gabapentin  1,200 mg Oral QHS  . heparin  5,000 Units Subcutaneous 3 times per day  . metoprolol  50 mg Oral BID  . mometasone-formoterol  2 puff Inhalation BID  . nicotine  21 mg Transdermal Daily  . pantoprazole  40 mg Oral Daily  . prazosin   2 mg Oral QHS  . predniSONE  20 mg Oral BID  . ramelteon  8 mg Oral QHS  . sodium chloride  3 mL Intravenous  Q12H  . sodium chloride  3 mL Intravenous Q12H  . traMADol  50 mg Oral Q8H  . venlafaxine XR  300 mg Oral Q breakfast   Continuous Infusions:  PRN Meds:.sodium chloride, acetaminophen, acetaminophen, albuterol, ALPRAZolam, ipratropium, ondansetron (ZOFRAN) IV, ondansetron, sodium chloride Assessment/Plan:   Heart failure with preserved ejection fraction - Patient had echo this admission with normal ejection fraction but presented with acute dyspnea.  CXR on admission suggested pulmonary edema and she was diuresed, repeat CXR yesterday showed marked improved in pulmonary edema suggestive of CHF as the cause of her acute dyspnea. - Patient ambulated by nursing and maintained O2 saturation of >95%. - Worked with physical therapy today with no acute needs or home health needs identified. - Will discharge home today and resume HCTZ, this may need to be reevaluated (and possibly changed to lasix) by her PCP which she confirms she will have close follow up.    Anxiety state - Home Xanax   Abdominal pain, generalized    Anemia, iron deficiency Hgb down to 8.6 mg/dl this admission, anemia panel was checked with showed high TIBC and ferritin of 20 suggesting IDA.  Of note she does have a history of diverticulosis and colon polyps.   -Will start patient on supplemental Iron and bowel regimen. She will need close follow up with her Gastroenterologist especially given her rencet weight loss.Marland Kitchen    COPD (chronic obstructive pulmonary disease) -Continue home meds, no wheezing on exam.    CKD (chronic kidney disease) stage 3, GFR 30-59 ml/min - Will need close follow up with her nephrologist Dr. Jimmy Footman.    Protein-calorie malnutrition, severe - Will need close monitoring of her weight as outpatient.  Dispo: Discharge home today  The patient does have a current PCP Leamon Arnt, MD)  and does not need an [facility] hospital follow-up appointment after discharge.  The patient does not have transportation limitations that hinder transportation to clinic appointments.  .Services Needed at time of discharge: Y = Yes, Blank = No PT:   OT:   RN:   Equipment:   Other:     LOS: 2 days   Lucious Groves, DO 07/04/2014, 3:00 PM

## 2014-07-04 NOTE — Progress Notes (Signed)
D/C instructions reviewed with Pt and family at the bedside. 1 percocet given per MD order for Pts headache of 8/10. Pt D/Cd at this time.

## 2014-07-08 LAB — CULTURE, BLOOD (ROUTINE X 2)
CULTURE: NO GROWTH
Culture: NO GROWTH

## 2014-07-26 ENCOUNTER — Inpatient Hospital Stay (HOSPITAL_COMMUNITY): Admission: RE | Admit: 2014-07-26 | Payer: Medicaid Other | Source: Ambulatory Visit

## 2014-07-30 ENCOUNTER — Inpatient Hospital Stay (HOSPITAL_COMMUNITY): Admission: RE | Admit: 2014-07-30 | Payer: Medicaid Other | Source: Ambulatory Visit

## 2014-07-30 DIAGNOSIS — D638 Anemia in other chronic diseases classified elsewhere: Secondary | ICD-10-CM | POA: Insufficient documentation

## 2014-09-06 DIAGNOSIS — I209 Angina pectoris, unspecified: Secondary | ICD-10-CM

## 2014-09-07 ENCOUNTER — Encounter (HOSPITAL_COMMUNITY): Payer: Self-pay | Admitting: General Practice

## 2014-09-07 ENCOUNTER — Encounter (HOSPITAL_COMMUNITY)
Admission: RE | Disposition: A | Discharge status: Home / Self Care | Payer: Self-pay | Source: Ambulatory Visit | Attending: Cardiology

## 2014-09-07 ENCOUNTER — Ambulatory Visit (HOSPITAL_COMMUNITY)
Admission: RE | Admit: 2014-09-07 | Discharge: 2014-09-08 | Disposition: A | Discharge status: Home / Self Care | Payer: Medicaid Other | Source: Ambulatory Visit | Attending: Cardiology | Admitting: Cardiology

## 2014-09-07 DIAGNOSIS — N184 Chronic kidney disease, stage 4 (severe): Secondary | ICD-10-CM | POA: Diagnosis not present

## 2014-09-07 DIAGNOSIS — I150 Renovascular hypertension: Secondary | ICD-10-CM | POA: Diagnosis present

## 2014-09-07 DIAGNOSIS — Z720 Tobacco use: Secondary | ICD-10-CM | POA: Insufficient documentation

## 2014-09-07 DIAGNOSIS — R001 Bradycardia, unspecified: Secondary | ICD-10-CM | POA: Insufficient documentation

## 2014-09-07 DIAGNOSIS — Z7982 Long term (current) use of aspirin: Secondary | ICD-10-CM | POA: Diagnosis not present

## 2014-09-07 DIAGNOSIS — I701 Atherosclerosis of renal artery: Secondary | ICD-10-CM | POA: Diagnosis not present

## 2014-09-07 DIAGNOSIS — I517 Cardiomegaly: Secondary | ICD-10-CM | POA: Diagnosis not present

## 2014-09-07 DIAGNOSIS — E785 Hyperlipidemia, unspecified: Secondary | ICD-10-CM | POA: Insufficient documentation

## 2014-09-07 DIAGNOSIS — J449 Chronic obstructive pulmonary disease, unspecified: Secondary | ICD-10-CM | POA: Insufficient documentation

## 2014-09-07 DIAGNOSIS — Z862 Personal history of diseases of the blood and blood-forming organs and certain disorders involving the immune mechanism: Secondary | ICD-10-CM | POA: Diagnosis not present

## 2014-09-07 DIAGNOSIS — I129 Hypertensive chronic kidney disease with stage 1 through stage 4 chronic kidney disease, or unspecified chronic kidney disease: Secondary | ICD-10-CM | POA: Diagnosis not present

## 2014-09-07 DIAGNOSIS — I209 Angina pectoris, unspecified: Secondary | ICD-10-CM

## 2014-09-07 DIAGNOSIS — I700 Atherosclerosis of aorta: Secondary | ICD-10-CM | POA: Insufficient documentation

## 2014-09-07 DIAGNOSIS — R079 Chest pain, unspecified: Secondary | ICD-10-CM | POA: Diagnosis present

## 2014-09-07 HISTORY — DX: Chronic kidney disease, stage 5: N18.5

## 2014-09-07 HISTORY — PX: LEFT HEART CATHETERIZATION WITH CORONARY ANGIOGRAM: SHX5451

## 2014-09-07 HISTORY — DX: Atherosclerotic heart disease of native coronary artery without angina pectoris: I25.10

## 2014-09-07 HISTORY — DX: Cardiac murmur, unspecified: R01.1

## 2014-09-07 HISTORY — DX: Headache, unspecified: R51.9

## 2014-09-07 HISTORY — DX: Headache: R51

## 2014-09-07 HISTORY — PX: ABDOMINAL ANGIOGRAM: SHX5499

## 2014-09-07 HISTORY — PX: CORONARY ANGIOPLASTY WITH STENT PLACEMENT: SHX49

## 2014-09-07 HISTORY — DX: Pneumonia, unspecified organism: J18.9

## 2014-09-07 LAB — BASIC METABOLIC PANEL
Anion gap: 15 (ref 5–15)
BUN: 51 mg/dL — ABNORMAL HIGH (ref 6–23)
CHLORIDE: 100 meq/L (ref 96–112)
CO2: 25 mEq/L (ref 19–32)
Calcium: 9.3 mg/dL (ref 8.4–10.5)
Creatinine, Ser: 2.02 mg/dL — ABNORMAL HIGH (ref 0.50–1.10)
GFR calc Af Amer: 32 mL/min — ABNORMAL LOW (ref >90)
GFR calc non Af Amer: 27 mL/min — ABNORMAL LOW (ref >90)
Glucose, Bld: 97 mg/dL (ref 70–99)
Potassium: 4.5 mEq/L (ref 3.7–5.3)
Sodium: 140 mEq/L (ref 137–147)

## 2014-09-07 LAB — POCT ACTIVATED CLOTTING TIME
Activated Clotting Time: 220 seconds
Activated Clotting Time: 221 seconds

## 2014-09-07 SURGERY — LEFT HEART CATHETERIZATION WITH CORONARY ANGIOGRAM
Anesthesia: LOCAL

## 2014-09-07 MED ORDER — LIDOCAINE HCL (PF) 1 % IJ SOLN
INTRAMUSCULAR | Status: AC
  Filled 2014-09-07: qty 30

## 2014-09-07 MED ORDER — CLOPIDOGREL BISULFATE 75 MG PO TABS
75.0000 mg | ORAL_TABLET | Freq: Every day | ORAL | Status: DC
  Administered 2014-09-08: 09:00:00 75 mg via ORAL
  Filled 2014-09-07: qty 1

## 2014-09-07 MED ORDER — SODIUM CHLORIDE 0.9 % IV SOLN: INTRAVENOUS | Status: DC

## 2014-09-07 MED ORDER — PANTOPRAZOLE SODIUM 40 MG PO TBEC
40.0000 mg | DELAYED_RELEASE_TABLET | Freq: Every day | ORAL | Status: DC
Start: 2014-09-07 — End: 2014-09-08
  Administered 2014-09-07 – 2014-09-08 (×2): 40 mg via ORAL
  Filled 2014-09-07 (×2): qty 1

## 2014-09-07 MED ORDER — METHYLPREDNISOLONE SODIUM SUCC 125 MG IJ SOLR: 125.0000 mg | Freq: Once | INTRAMUSCULAR | Status: DC

## 2014-09-07 MED ORDER — HEPARIN (PORCINE) IN NACL 2-0.9 UNIT/ML-% IJ SOLN
INTRAMUSCULAR | Status: AC
  Filled 2014-09-07: qty 1000

## 2014-09-07 MED ORDER — GABAPENTIN 400 MG PO CAPS
400.0000 mg | ORAL_CAPSULE | Freq: Two times a day (BID) | ORAL | Status: DC
Start: 2014-09-07 — End: 2014-09-08
  Administered 2014-09-07 – 2014-09-08 (×2): 400 mg via ORAL
  Filled 2014-09-07 (×3): qty 1

## 2014-09-07 MED ORDER — FENTANYL CITRATE 0.05 MG/ML IJ SOLN
INTRAMUSCULAR | Status: AC
  Filled 2014-09-07: qty 2

## 2014-09-07 MED ORDER — LABETALOL HCL 5 MG/ML IV SOLN
INTRAVENOUS | Status: AC
  Filled 2014-09-07: qty 4

## 2014-09-07 MED ORDER — AMLODIPINE BESYLATE 10 MG PO TABS
10.0000 mg | ORAL_TABLET | Freq: Every day | ORAL | Status: DC
  Administered 2014-09-08: 06:00:00 10 mg via ORAL
  Filled 2014-09-07 (×2): qty 1

## 2014-09-07 MED ORDER — METOPROLOL TARTRATE 50 MG PO TABS
50.0000 mg | ORAL_TABLET | Freq: Two times a day (BID) | ORAL | Status: DC
  Administered 2014-09-07 – 2014-09-08 (×2): 50 mg via ORAL
  Filled 2014-09-07 (×2): qty 1
  Filled 2014-09-07: qty 2
  Filled 2014-09-07: qty 1

## 2014-09-07 MED ORDER — ALPRAZOLAM 0.5 MG PO TABS
1.0000 mg | ORAL_TABLET | Freq: Three times a day (TID) | ORAL | Status: DC | PRN
  Administered 2014-09-07 – 2014-09-08 (×2): 1 mg via ORAL
  Filled 2014-09-07 (×2): qty 2

## 2014-09-07 MED ORDER — HYDRALAZINE HCL 20 MG/ML IJ SOLN
INTRAMUSCULAR | Status: AC
  Filled 2014-09-07: qty 1

## 2014-09-07 MED ORDER — MOMETASONE FURO-FORMOTEROL FUM 200-5 MCG/ACT IN AERO
2.0000 | INHALATION_SPRAY | Freq: Two times a day (BID) | RESPIRATORY_TRACT | Status: DC
  Filled 2014-09-07: qty 8.8

## 2014-09-07 MED ORDER — HEPARIN SODIUM (PORCINE) 1000 UNIT/ML IJ SOLN
INTRAMUSCULAR | Status: AC
  Filled 2014-09-07: qty 1

## 2014-09-07 MED ORDER — CLOPIDOGREL BISULFATE 75 MG PO TABS
600.0000 mg | ORAL_TABLET | Freq: Once | ORAL | Status: AC
  Administered 2014-09-07: 600 mg via ORAL
  Filled 2014-09-07: qty 8

## 2014-09-07 MED ORDER — NITROGLYCERIN 1 MG/10 ML FOR IR/CATH LAB
INTRA_ARTERIAL | Status: AC
  Filled 2014-09-07: qty 10

## 2014-09-07 MED ORDER — DOCUSATE SODIUM 100 MG PO CAPS
100.0000 mg | ORAL_CAPSULE | Freq: Two times a day (BID) | ORAL | Status: DC
  Administered 2014-09-07: 100 mg via ORAL
  Filled 2014-09-07 (×3): qty 1

## 2014-09-07 MED ORDER — SODIUM CHLORIDE 0.9 % IV BOLUS (SEPSIS)
500.0000 mL | Freq: Once | INTRAVENOUS | Status: AC
  Administered 2014-09-07: 500 mL via INTRAVENOUS

## 2014-09-07 MED ORDER — BOOST / RESOURCE BREEZE PO LIQD
1.0000 | Freq: Three times a day (TID) | ORAL | Status: DC
  Administered 2014-09-07 – 2014-09-08 (×2): 1 via ORAL
  Filled 2014-09-07 (×6): qty 1

## 2014-09-07 MED ORDER — SODIUM CHLORIDE 0.9 % IJ SOLN: 3.0000 mL | INTRAMUSCULAR | Status: DC | PRN

## 2014-09-07 MED ORDER — VENLAFAXINE HCL ER 150 MG PO CP24
300.0000 mg | ORAL_CAPSULE | Freq: Every day | ORAL | Status: DC
  Administered 2014-09-08: 300 mg via ORAL
  Filled 2014-09-07 (×2): qty 2

## 2014-09-07 MED ORDER — ACETAMINOPHEN 325 MG PO TABS
650.0000 mg | ORAL_TABLET | ORAL | Status: DC | PRN
  Administered 2014-09-07 – 2014-09-08 (×2): 650 mg via ORAL
  Filled 2014-09-07 (×2): qty 2

## 2014-09-07 MED ORDER — SODIUM CHLORIDE 0.9 % IV SOLN: 250.0000 mL | INTRAVENOUS | Status: DC | PRN

## 2014-09-07 MED ORDER — MIDAZOLAM HCL 2 MG/2ML IJ SOLN
INTRAMUSCULAR | Status: AC
  Filled 2014-09-07: qty 2

## 2014-09-07 MED ORDER — ONDANSETRON HCL 4 MG/2ML IJ SOLN: 4.0000 mg | Freq: Four times a day (QID) | INTRAMUSCULAR | Status: DC | PRN

## 2014-09-07 MED ORDER — SODIUM CHLORIDE 0.9 % IV SOLN
40.0000 mg | Freq: Once | INTRAVENOUS | Status: DC
  Filled 2014-09-07: qty 4

## 2014-09-07 MED ORDER — SODIUM CHLORIDE 0.9 % IJ SOLN: 3.0000 mL | Freq: Two times a day (BID) | INTRAMUSCULAR | Status: DC

## 2014-09-07 MED ORDER — ALBUTEROL SULFATE (2.5 MG/3ML) 0.083% IN NEBU: 3.0000 mL | INHALATION_SOLUTION | Freq: Two times a day (BID) | RESPIRATORY_TRACT | Status: DC | PRN

## 2014-09-07 MED ORDER — DIPHENHYDRAMINE HCL 50 MG/ML IJ SOLN: 25.0000 mg | Freq: Once | INTRAMUSCULAR | Status: DC

## 2014-09-07 MED ORDER — VITAMIN D3 25 MCG (1000 UNIT) PO TABS
1000.0000 [IU] | ORAL_TABLET | Freq: Every day | ORAL | Status: DC
  Administered 2014-09-07 – 2014-09-08 (×2): 1000 [IU] via ORAL
  Filled 2014-09-07 (×2): qty 1

## 2014-09-07 MED ORDER — PRAZOSIN HCL 2 MG PO CAPS
2.0000 mg | ORAL_CAPSULE | Freq: Every day | ORAL | Status: DC
  Filled 2014-09-07: qty 1

## 2014-09-07 NOTE — CV Procedure (Signed)
Procedure performed: Selective left and right coronary arteriogram, abdominal aortogram, selective right and left renal arteriogram, PTA and stenting of the right renal artery with implantation of a 6.0 x 15 mm Herculink Elite balloon-expandable stent, stenosis reduced from 99% to 0%. PTA and stenting of the left renal artery with implantation of a 6.0 x 18 mm Herculink Elite balloon-expandable stent, stenosis reduced from 90% to 0%. No gradient post angioplasty. Right femoral arteriogram and closure of right femoral arterial access with Perclose.  Indication: Patient is a 53 year old Caucasian female with chronic tobacco use disorder, hyperlipidemia, hypertensive renal disease, stage 3-4 chronic kidney disease, difficult to control hypertension, who presents with chest pain suggestive of angina pectoris. Outpatient stress test revealed dilated LV, post stress dilatation of the LV, no evidence of ischemia, EF 36%, considered to be high risk and cannot exclude multivessel disease. Outpatient radiology renal duplex had revealed normal appearing kidneys with high range normal velocity across both the renal arteries. However due to difficult to control hypertension, abnormal stress test, clinically a loud bruit in the abdomen, I had suspected she probably has renal artery stenosis, she had developed renal failure on ACE inhibitor in the past, with this, she was brought to the angiography suite to eval at both the coronary anatomy and possible renal anatomy and possible renal angioplasty.  Patient was hydrated prior to angiography, staged coronary intervention was planned, minimal contrast use was planned, patient aware of risks and benefits.  Coronary angiography: Aortic pressures were 204/94 with a mean of 131 mmHg. Left main coronary artery is smooth and normal. Circumflex coronary artery large vessel, smooth and normal. LAD large vessel, smooth and normal. Right coronary artery dominant, smooth and  normal.  Abdominal aortogram: The infrarenal abdominal aorta was severely calcified and there is moderate diffuse atherosclerotic changes noted throughout the aorta. There were 2 renal arteries one on either sides.   Selective right and left renal arteriogram: Right renal artery had a high-grade 90-95% stenosis, left renal artery 90% stenosis, both heavily calcified.  Interventional data: Successful PTA and stenting of bilateral renal arteries. PTA and stenting of the right renal artery with implantation of a 6.0 x 15 mm Herculink Elite balloon-expandable stent, stenosis reduced from 99% to 0%. PTA and stenting of the left renal artery with implantation of a 6.0 x 18 mm Herculink Elite balloon-expandable stent, stenosis reduced from 90% to 0%. No gradient post angioplasty.  A total of 40-50 mL of contrast was utilized for the entire procedure.   Technique: Under sterile precautions using a 5 French right femoral arterial access a 5 French Judkins left diagnostic catheter was utilized to engage left main coronary artery and angiography was performed. The catheter then exchanged to a 5 [place] Judkins right diagnostic catheter and angiography was performed. Same catheter was utilized to perform abdominal aortogram and selective right and left renal arteriogram.  Technique of intervention: 5 French sheath exchanged to a 6 [place] sheath. A 6 French LIMA guide catheter was utilized engaged the right artery. Using stabilizer 0.014 x 1 90 cm guidewire, I cross the right renal artery stenosis, using heparin find coagulation, I utilized a 5.0 x 15 mm via Trak 14+ balloon and performed balloon angioplasty at 12 atmospheric pressure 2 for 60 seconds and 90 seconds. This was followed by implantation of a 6.0 x 15 mm Herculink stent deployed at 10 atmospheric pressure. Both the inflow and outflow of the stent was post dilated at 12 atmospheric pressure for 45 seconds each.  Attention was then directed towards the  left renal artery. Much more difficult cannulation, I had to use 5 French glide tip SOS catheter to engage the left renal artery ostium. Once the ostium was engaged, the SOS catheter was pulled out and the guide catheter 6 French LIMA guide was utilized for performing angioplasty. Using the same guidewire, I utilized a previously used via Trak balloon and balloon angioplasty at 10 atmospheric pressure was performed 2 for 60 and 30 seconds. This was followed by attempt to angioplasty and stenting with a 6.0 x 18 mm Herculink Elite stent, the whole system prolapsed including the guidewire. I was able to again reengage the left renal artery, and using stabilizer guidewire I then advanced the previously utilized 5.0 x 15 mm via Trak balloon and the balloon was inflated to 12 atmospheric pressure distal to the stenosis, and I gently advanced the guide catheter to be throat the left renal artery. The balloon was then pulled out and then the stent was readvanced and this time I was able to stent the left renal artery and deployed the stent at 10 atmospheric pressure for 60 seconds followed by post dilatation at the ostium at 10 atmospheric pressure for 30 seconds. Post angioplasty there was no pressure gradient, angiography revealed excellent result. The guidewire was withdrawn guide catheter disengaged and pulled out of the body over the J-wire. Right femoral arterial gram was performed through the arterial access sheath and access closed with Perclose with excellent hemostasis. A total of 40-50 mL of contrast was utilized for the entire procedure.

## 2014-09-07 NOTE — H&P (Signed)
  Please see office visit notes for complete details of HPI.  

## 2014-09-07 NOTE — Interval H&P Note (Signed)
History and Physical Interval Note:  09/07/2014 11:52 AM  [name]  has presented today for surgery, with the diagnosis of cp, positive nuclear stress test  The various methods of treatment have been discussed with the patient and family. After consideration of risks, benefits and other options for treatment, the patient has consented to  Procedure(s): LEFT HEART CATHETERIZATION WITH CORONARY ANGIOGRAM (N/A) ABDOMINAL ANGIOGRAM (N/A) and possible renal angioplasty as a surgical intervention .  [name] history has been reviewed, patient examined, no change in status, stable for surgery.  I have reviewed the patient's chart and labs.  Questions were answered to the patient's satisfaction.     Laverda Page

## 2014-09-07 NOTE — Progress Notes (Addendum)
Called [employer] RN, regarding abnormal lab values of BUN 52 and Creatinine 2.16 Drawn in office and new order for stat BMET obtained.

## 2014-09-07 NOTE — Progress Notes (Signed)
Stat BMP results back NS bolus completed.  Called to  Annie Main, R.N. Who will discuss with Dr. Einar Gip and notify us of plan of care.

## 2014-09-08 DIAGNOSIS — I700 Atherosclerosis of aorta: Secondary | ICD-10-CM | POA: Diagnosis not present

## 2014-09-08 LAB — BASIC METABOLIC PANEL
Anion gap: 15 (ref 5–15)
BUN: 27 mg/dL — ABNORMAL HIGH (ref 6–23)
CO2: 23 mEq/L (ref 19–32)
CREATININE: 1.3 mg/dL — AB (ref 0.50–1.10)
Calcium: 9.7 mg/dL (ref 8.4–10.5)
Chloride: 100 mEq/L (ref 96–112)
GFR calc non Af Amer: 47 mL/min — ABNORMAL LOW (ref >90)
GFR, EST AFRICAN AMERICAN: 54 mL/min — AB (ref >90)
Glucose, Bld: 89 mg/dL (ref 70–99)
Potassium: 3.8 mEq/L (ref 3.7–5.3)
Sodium: 138 mEq/L (ref 137–147)

## 2014-09-08 LAB — CBC
HEMATOCRIT: 35.1 % — AB (ref 36.0–46.0)
Hemoglobin: 11.3 g/dL — ABNORMAL LOW (ref 12.0–15.0)
MCH: 28.8 pg (ref 26.0–34.0)
MCHC: 32.2 g/dL (ref 30.0–36.0)
MCV: 89.5 fL (ref 78.0–100.0)
Platelets: 363 10*3/uL (ref 150–400)
RBC: 3.92 MIL/uL (ref 3.87–5.11)
RDW: 17.5 % — ABNORMAL HIGH (ref 11.5–15.5)
WBC: 6.3 10*3/uL (ref 4.0–10.5)

## 2014-09-08 MED ORDER — ASPIRIN 81 MG PO TABS: 81.0000 mg | ORAL_TABLET | Freq: Every day | ORAL | Status: DC

## 2014-09-08 MED ORDER — FAMOTIDINE 20 MG PO TABS: 20.0000 mg | ORAL_TABLET | Freq: Two times a day (BID) | ORAL | Status: DC

## 2014-09-08 MED ORDER — CLOPIDOGREL BISULFATE 75 MG PO TABS: 75.0000 mg | ORAL_TABLET | Freq: Every day | ORAL | Status: DC

## 2014-09-08 NOTE — Discharge Summary (Signed)
Physician Discharge Summary  [name] MRN: [id number] DOB/AGE: Jul 02, 1961 53 y.o.  Admit date: 09/07/2014 Discharge date: 09/08/2014  Primary Discharge Diagnosis Renovascular hypertension Bilateral renal artery stenosis, successful PTA and stenting of bilateral renal arteries with implantation of a 6.0 x 18 mm Herculink Elite balloon-expandable stent, stenosis reduced from 90% to 0%. Stage IV chronic kidney disease  Secondary Discharge Diagnosis Hypertension Hyperlipidemia Tobacco use disorder COPD  Significant Diagnostic Studies: 09/07/2014: Coronary angiography: Aortic pressures were 204/94 with a mean of 131 mmHg. Left main coronary artery is smooth and normal. Circumflex coronary artery large vessel, smooth and normal. LAD large vessel, smooth and normal. Right coronary artery dominant, smooth and normal.  Abdominal aortogram: The infrarenal abdominal aorta was severely calcified and there is moderate diffuse atherosclerotic changes noted throughout the aorta. There were 2 renal arteries one on either sides.   Selective right and left renal arteriogram: Right renal artery had a high-grade 90-95% stenosis, left renal artery 90% stenosis, both heavily calcified.  Interventional data: Successful PTA and stenting of bilateral renal arteries. PTA and stenting of the right renal artery with implantation of a 6.0 x 15 mm Herculink Elite balloon-expandable stent, stenosis reduced from 99% to 0%. PTA and stenting of the left renal artery with implantation of a 6.0 x 18 mm Herculink Elite balloon-expandable stent, stenosis reduced from 90% to 0%. No gradient post angioplasty. A total of 40-50 mL of contrast was utilized for the entire procedure.  Hospital Course: Patient in the outpatient basis had chest pain suggestive of angina pectoris, has multiple cardio vascular risk factors, outpatient stress testing had revealed post stress LV dilatation, hence given continued chest  discomfort, multivessel disease was thought about, she was scheduled for outpatient elective coronary angiography.  She also has difficult to control hypertension and stage IV chronic kidney disease. I had clinically suspected bilateral renal artery stenosis. Hence was also scheduled for renal arteriogram. Due to stage IV chronic kidney disease minimal contrast use was planned.  Patient underwent successful coronary and renal arteriogram, revealing no significant coronary artery disease, but has extensive abdominal aortic atherosclerosis, bilateral atherosclerotic renal artery stenosis which were high-grade between 90-95%. She underwent successful revascularization.  Following this, a blood pressure immediately improved, serum creatinine decreased from 2.12 to 1.3, EGFR improved from 22 to 47 mL. The following morning although the blood pressure was slightly elevated, no changes were done, would let her recuperate, I will see her back in the outpatient basis, and then readjust her antihypertensive medications. Smoking cessation was again discussed with the patient.  Recommendations on discharge: Patient has history of anemia in the past, has history of GI bleed remotely, I have started her on clopidogrel 75 mg by mouth daily, will use it at least for minimum of 4 weeks, if possible will try to use it for a total of 12 weeks and also advised her to take aspirin 81 mg by mouth daily indefinitely. Started the patient on Pepcid 40 mg by mouth daily. I will see her back in the office in 10 days to 2 weeks.  Discharge Exam: Blood pressure 176/89, pulse 54, temperature 97.8 F (36.6 C), temperature source Oral, resp. rate 18, height 5' 1.25" (1.556 m), weight 43.5 kg (95 lb 14.4 oz), SpO2 95 %.   General appearance: alert, cooperative, appears older than stated age and no distress Resp: rhonchi bilaterally and wheezes bilaterally Cardio: S1, S2 normal, no S3 or S4 and systolic murmur: early systolic 1/6,  crescendo at 2nd  right intercostal space GI: soft, non-tender; bowel sounds normal; no masses,  no organomegaly Extremities: extremities normal, atraumatic, no cyanosis or edema Pulses: Faint bilateral carotid bruit present, prominent abdominal bruit present, faint bilateral femoral bruit present. Popliteal pulse is 1-2+ and pedal pulses 1+ bilaterally. Right groin site has healed well. Neurologic: Grossly normal Labs:   Lab Results  Component Value Date   WBC 6.3 09/08/2014   HGB 11.3* 09/08/2014   HCT 35.1* 09/08/2014   MCV 89.5 09/08/2014   PLT 363 09/08/2014    Recent Labs Lab 09/08/14 0258  NA 138  K 3.8  CL 100  CO2 23  BUN 27*  CREATININE 1.30*  CALCIUM 9.7  GLUCOSE 89   Lab Results  Component Value Date   TROPONINI <0.30 07/03/2014    Lipid Panel     Component Value Date/Time   CHOL 190 10/08/2012 0228   TRIG 152* 10/08/2012 0228   HDL 51 10/08/2012 0228   CHOLHDL 3.7 10/08/2012 0228   VLDL 30 10/08/2012 0228   LDLCALC 109* 10/08/2012 0228  . FOLLOW UP PLANS AND APPOINTMENTS    Medication List    STOP taking these medications        ferrous sulfate 325 (65 FE) MG tablet     furosemide 80 MG tablet  Commonly known as:  LASIX      TAKE these medications        acetaminophen 500 MG tablet  Commonly known as:  TYLENOL  Take 2,000 mg by mouth every 6 (six) hours as needed for headache.     albuterol 108 (90 BASE) MCG/ACT inhaler  Commonly known as:  PROVENTIL HFA;VENTOLIN HFA  Inhale 2 puffs into the lungs 2 (two) times daily as needed (asthma).     ALPRAZolam 0.5 MG tablet  Commonly known as:  XANAX  Take 1 mg by mouth 3 (three) times daily as needed for anxiety.     amLODipine 10 MG tablet  Commonly known as:  NORVASC  Take 1 tablet (10 mg total) by mouth daily.     aspirin 81 MG tablet  Take 1 tablet (81 mg total) by mouth daily.     cholecalciferol 1000 UNITS tablet  Commonly known as:  VITAMIN D  Take 1,000 Units by mouth daily.      clopidogrel 75 MG tablet  Commonly known as:  PLAVIX  Take 1 tablet (75 mg total) by mouth daily.     DSS 100 MG Caps  Take 100 mg by mouth 2 (two) times daily.     famotidine 20 MG tablet  Commonly known as:  PEPCID  Take 1 tablet (20 mg total) by mouth 2 (two) times daily.     feeding supplement (RESOURCE BREEZE) Liqd  Take 1 Container by mouth 3 (three) times daily between meals.     Fluticasone-Salmeterol 500-50 MCG/DOSE Aepb  Commonly known as:  ADVAIR  Inhale 1 puff into the lungs 2 (two) times daily.     gabapentin 400 MG capsule  Commonly known as:  NEURONTIN  Take 1 capsule (400 mg total) by mouth 2 (two) times daily. Patient takes 400 mg 3 times daily and 1200 mg at bedtime     metoprolol 50 MG tablet  Commonly known as:  LOPRESSOR  Take 50 mg by mouth 2 (two) times daily.     pantoprazole 40 MG tablet  Commonly known as:  PROTONIX  Take 40 mg by mouth daily.     prazosin 2 MG capsule  Commonly known as:  MINIPRESS  Take 2 mg by mouth at bedtime.     venlafaxine XR 150 MG 24 hr capsule  Commonly known as:  EFFEXOR-XR  Take 300 mg by mouth daily with breakfast.          Laverda Page, MD 09/08/2014, 8:20 AM  Pager: [phone] Office: [phone] If no answer: [phone]

## 2014-09-09 ENCOUNTER — Encounter (HOSPITAL_COMMUNITY): Payer: Self-pay | Admitting: Cardiology

## 2014-10-19 DIAGNOSIS — Z9049 Acquired absence of other specified parts of digestive tract: Secondary | ICD-10-CM | POA: Insufficient documentation

## 2015-09-07 ENCOUNTER — Emergency Department (HOSPITAL_COMMUNITY)
Admission: EM | Admit: 2015-09-07 | Discharge: 2015-09-07 | Disposition: A | Discharge status: Home / Self Care | Payer: Medicare Other | Attending: Emergency Medicine | Admitting: Emergency Medicine

## 2015-09-07 ENCOUNTER — Emergency Department (HOSPITAL_COMMUNITY): Payer: Medicare Other

## 2015-09-07 ENCOUNTER — Encounter (HOSPITAL_COMMUNITY): Payer: Self-pay

## 2015-09-07 DIAGNOSIS — N185 Chronic kidney disease, stage 5: Secondary | ICD-10-CM | POA: Diagnosis not present

## 2015-09-07 DIAGNOSIS — Z7951 Long term (current) use of inhaled steroids: Secondary | ICD-10-CM | POA: Insufficient documentation

## 2015-09-07 DIAGNOSIS — Z9889 Other specified postprocedural states: Secondary | ICD-10-CM | POA: Insufficient documentation

## 2015-09-07 DIAGNOSIS — Z8541 Personal history of malignant neoplasm of cervix uteri: Secondary | ICD-10-CM | POA: Diagnosis not present

## 2015-09-07 DIAGNOSIS — R011 Cardiac murmur, unspecified: Secondary | ICD-10-CM | POA: Insufficient documentation

## 2015-09-07 DIAGNOSIS — K589 Irritable bowel syndrome without diarrhea: Secondary | ICD-10-CM | POA: Diagnosis not present

## 2015-09-07 DIAGNOSIS — F419 Anxiety disorder, unspecified: Secondary | ICD-10-CM | POA: Insufficient documentation

## 2015-09-07 DIAGNOSIS — Z86010 Personal history of colonic polyps: Secondary | ICD-10-CM | POA: Diagnosis not present

## 2015-09-07 DIAGNOSIS — Z862 Personal history of diseases of the blood and blood-forming organs and certain disorders involving the immune mechanism: Secondary | ICD-10-CM | POA: Diagnosis not present

## 2015-09-07 DIAGNOSIS — Z7982 Long term (current) use of aspirin: Secondary | ICD-10-CM | POA: Diagnosis not present

## 2015-09-07 DIAGNOSIS — Z8701 Personal history of pneumonia (recurrent): Secondary | ICD-10-CM | POA: Insufficient documentation

## 2015-09-07 DIAGNOSIS — M79645 Pain in left finger(s): Secondary | ICD-10-CM

## 2015-09-07 DIAGNOSIS — M1990 Unspecified osteoarthritis, unspecified site: Secondary | ICD-10-CM | POA: Diagnosis not present

## 2015-09-07 DIAGNOSIS — F319 Bipolar disorder, unspecified: Secondary | ICD-10-CM | POA: Diagnosis not present

## 2015-09-07 DIAGNOSIS — I120 Hypertensive chronic kidney disease with stage 5 chronic kidney disease or end stage renal disease: Secondary | ICD-10-CM | POA: Diagnosis not present

## 2015-09-07 DIAGNOSIS — Z79899 Other long term (current) drug therapy: Secondary | ICD-10-CM | POA: Insufficient documentation

## 2015-09-07 DIAGNOSIS — J449 Chronic obstructive pulmonary disease, unspecified: Secondary | ICD-10-CM | POA: Diagnosis not present

## 2015-09-07 DIAGNOSIS — Z7902 Long term (current) use of antithrombotics/antiplatelets: Secondary | ICD-10-CM | POA: Diagnosis not present

## 2015-09-07 DIAGNOSIS — Z9861 Coronary angioplasty status: Secondary | ICD-10-CM | POA: Diagnosis not present

## 2015-09-07 DIAGNOSIS — Z8639 Personal history of other endocrine, nutritional and metabolic disease: Secondary | ICD-10-CM | POA: Diagnosis not present

## 2015-09-07 DIAGNOSIS — I2510 Atherosclerotic heart disease of native coronary artery without angina pectoris: Secondary | ICD-10-CM | POA: Diagnosis not present

## 2015-09-07 DIAGNOSIS — G2581 Restless legs syndrome: Secondary | ICD-10-CM | POA: Insufficient documentation

## 2015-09-07 DIAGNOSIS — K219 Gastro-esophageal reflux disease without esophagitis: Secondary | ICD-10-CM | POA: Insufficient documentation

## 2015-09-07 NOTE — ED Notes (Signed)
Patient reports that she had a puncture wound to the left thumb and is now having pain that radiates into the left shoulder. Patient also reports decreased movement of the left thumb.

## 2015-09-07 NOTE — ED Provider Notes (Signed)
CSN: [id number]     Arrival date & time 09/07/15  1119 History   First MD Initiated Contact with Patient 09/07/15 1134     Chief Complaint  Patient presents with  . thumb injury      (Consider location/radiation/quality/duration/timing/severity/associated sxs/prior Treatment) HPI Comments: Pt comes in with c/o left thumb tenderness that started after a puncture wound 2 weeks ago. She states that the puncture wound healed but she feels like she can't move her thumb at times and she gets radiating pain up to her shoulder. Denies numbness or weakness. No previous injury  The history is provided by the patient. No language interpreter was used.    Past Medical History  Diagnosis Date  . COPD (chronic obstructive pulmonary disease) ([place]) 2000  . Carpal tunnel syndrome   . Diverticulitis 2008  . Pancreatitis 10/2011  . Schizophrenia ([place])   . RLS (restless legs syndrome)   . Bipolar 1 disorder ([place])   . Small bowel obstruction ([place]) 2008  . Cervical cancer ([place]) 1985  . History of colon polyps 2009  . Anemia 2008  . Hypertension 2013  . Anxiety   . Asthma   . Depression 2000  . HLD (hyperlipidemia) 2013  . IBS (irritable bowel syndrome) 2008  . Shortness of breath   . Coronary artery disease   . Heart murmur   . GERD (gastroesophageal reflux disease)   . Daily headache   . Arthritis     "all my joints ache" (09/07/2014)  . Chronic kidney disease (CKD), stage V ([place])   . Pneumonia 07/2014   Past Surgical History  Procedure Laterality Date  . Colon surgery  2008    6 inches of colon removed due to obstruction  . Right oophorectomy Right 1996  . Total abdominal hysterectomy  1997  . Coronary angioplasty with stent placement  09/07/2014    "2"  . Appendectomy  1996  . Left heart catheterization with coronary angiogram N/A 09/07/2014    Procedure: LEFT HEART CATHETERIZATION WITH CORONARY ANGIOGRAM;  Surgeon: Laverda Page, MD;  Location[facility] C[facility] LAB;  Service:  Cardiovascular;  Laterality: N/A;  . Abdominal angiogram N/A 09/07/2014    Procedure: ABDOMINAL ANGIOGRAM;  Surgeon: Laverda Page, MD;  Location: [facility] CATH LAB;  Service: Cardiovascular;  Laterality: N/A;   Family History  Problem Relation Age of Onset  . Other Mother     many bowel obstructions  . Kidney cancer Father   . Bone cancer Father   . Diabetes Father   . Diabetes Daughter   . Heart disease Mother   . Heart disease Father    Social History  Substance Use Topics  . Smoking status: Current Every Day Smoker -- 0.50 packs/day for 40 years    Types: Cigarettes  . Smokeless tobacco: Never Used  . Alcohol Use: No   OB History    No data available     Review of Systems  All other systems reviewed and are negative.     Allergies  Doxycycline; Aspirin; Hydrocodone-acetaminophen; and Iohexol  Home Medications   Prior to Admission medications   Medication Sig Start Date End Date Taking? Authorizing Provider  acetaminophen (TYLENOL) 500 MG tablet Take 2,000 mg by mouth every 6 (six) hours as needed for headache.    Historical Provider, MD  albuterol (PROVENTIL HFA;VENTOLIN HFA) 108 (90 BASE) MCG/ACT inhaler Inhale 2 puffs into the lungs 2 (two) times daily as needed (asthma).     Historical Provider, MD  ALPRAZolam (XANAX) 0.5 MG tablet Take 1 mg by mouth 3 (three) times daily as needed for anxiety.     Historical Provider, MD  amLODipine (NORVASC) 10 MG tablet Take 1 tablet (10 mg total) by mouth daily. 07/04/14   Lucious Groves, DO  aspirin 81 MG tablet Take 1 tablet (81 mg total) by mouth daily. 09/08/14   Adrian Prows, MD  cholecalciferol (VITAMIN D) 1000 UNITS tablet Take 1,000 Units by mouth daily.    Historical Provider, MD  clopidogrel (PLAVIX) 75 MG tablet Take 1 tablet (75 mg total) by mouth daily. 09/08/14   Adrian Prows, MD  docusate sodium 100 MG CAPS Take 100 mg by mouth 2 (two) times daily. 07/04/14   Lucious Groves, DO  famotidine (PEPCID) 20 MG tablet Take 1  tablet (20 mg total) by mouth 2 (two) times daily. 09/08/14   Adrian Prows, MD  feeding supplement, RESOURCE BREEZE, (RESOURCE BREEZE) LIQD Take 1 Container by mouth 3 (three) times daily between meals. 07/04/14   Lucious Groves, DO  Fluticasone-Salmeterol (ADVAIR) 500-50 MCG/DOSE AEPB Inhale 1 puff into the lungs 2 (two) times daily.    Historical Provider, MD  gabapentin (NEURONTIN) 400 MG capsule Take 1 capsule (400 mg total) by mouth 2 (two) times daily. Patient takes 400 mg 3 times daily and 1200 mg at bedtime Patient taking differently: Take 400 mg by mouth 2 (two) times daily.  07/04/14   Lucious Groves, DO  metoprolol (LOPRESSOR) 50 MG tablet Take 50 mg by mouth 2 (two) times daily.    Historical Provider, MD  pantoprazole (PROTONIX) 40 MG tablet Take 40 mg by mouth daily.    Historical Provider, MD  prazosin (MINIPRESS) 2 MG capsule Take 2 mg by mouth at bedtime.    Historical Provider, MD  venlafaxine XR (EFFEXOR-XR) 150 MG 24 hr capsule Take 300 mg by mouth daily with breakfast.    Historical Provider, MD   BP 139/84 mmHg  Pulse 53  Temp(Src) 98.1 F (36.7 C) (Oral)  Resp 16  Ht 5' 1.75" (1.568 m)  Wt 48.081 kg  BMI 19.56 kg/m2  SpO2 97% Physical Exam  Constitutional: She is oriented to person, place, and time. She appears well-developed and well-nourished.  Cardiovascular: Normal rate and regular rhythm.   Pulmonary/Chest: Effort normal and breath sounds normal.  Musculoskeletal: Normal range of motion.  Full rom of the left thumb. Good sensation and strength. Tender at the base of the tumb. Abrasion noted. No open wound  Neurological: She is alert and oriented to person, place, and time. She exhibits normal muscle tone. Coordination normal.  Skin: Skin is warm and dry.  Nursing note and vitals reviewed.   ED Course  Procedures (including critical care time) Labs Review Labs Reviewed - No data to display  Imaging Review Dg Finger Thumb Left  09/07/2015  CLINICAL DATA:   Puncture wound 2 weeks ago with persistent pain and decreased motion, initial encounter EXAM: LEFT THUMB 2+V COMPARISON:  None. FINDINGS: No acute fracture or dislocation is noted. No significant soft tissue abnormality is seen. No radiopaque foreign body is noted. IMPRESSION: No acute abnormality seen. Electronically Signed   By: Inez Catalina M.D.   On: 09/07/2015 12:45   I have personally reviewed and evaluated these images and lab results as part of my medical decision-making.   EKG Interpretation None      MDM   Final diagnoses:  Thumb pain, left    No acute bony problem  noted. No cellulitis. Pt placed in thumb spica for comfort.    Glendell Docker, NP 09/07/15 [place], MD 09/07/15 [phone]

## 2015-09-07 NOTE — Discharge Instructions (Signed)

## 2015-10-31 ENCOUNTER — Inpatient Hospital Stay (HOSPITAL_COMMUNITY)
Admission: EM | Admit: 2015-10-31 | Discharge: 2015-11-02 | DRG: 253 | Disposition: A | Discharge status: Home / Self Care | Payer: Medicare Other | Attending: Cardiology | Admitting: Cardiology

## 2015-10-31 ENCOUNTER — Encounter (HOSPITAL_COMMUNITY): Payer: Self-pay | Admitting: Emergency Medicine

## 2015-10-31 DIAGNOSIS — F319 Bipolar disorder, unspecified: Secondary | ICD-10-CM | POA: Diagnosis present

## 2015-10-31 DIAGNOSIS — I701 Atherosclerosis of renal artery: Secondary | ICD-10-CM | POA: Diagnosis present

## 2015-10-31 DIAGNOSIS — I2510 Atherosclerotic heart disease of native coronary artery without angina pectoris: Secondary | ICD-10-CM | POA: Diagnosis present

## 2015-10-31 DIAGNOSIS — Z79899 Other long term (current) drug therapy: Secondary | ICD-10-CM

## 2015-10-31 DIAGNOSIS — J45909 Unspecified asthma, uncomplicated: Secondary | ICD-10-CM | POA: Diagnosis present

## 2015-10-31 DIAGNOSIS — R0602 Shortness of breath: Secondary | ICD-10-CM | POA: Diagnosis not present

## 2015-10-31 DIAGNOSIS — F419 Anxiety disorder, unspecified: Secondary | ICD-10-CM | POA: Diagnosis present

## 2015-10-31 DIAGNOSIS — I10 Essential (primary) hypertension: Secondary | ICD-10-CM

## 2015-10-31 DIAGNOSIS — Z7902 Long term (current) use of antithrombotics/antiplatelets: Secondary | ICD-10-CM

## 2015-10-31 DIAGNOSIS — T82858A Stenosis of vascular prosthetic devices, implants and grafts, initial encounter: Secondary | ICD-10-CM | POA: Diagnosis not present

## 2015-10-31 DIAGNOSIS — K219 Gastro-esophageal reflux disease without esophagitis: Secondary | ICD-10-CM | POA: Diagnosis present

## 2015-10-31 DIAGNOSIS — F17210 Nicotine dependence, cigarettes, uncomplicated: Secondary | ICD-10-CM | POA: Diagnosis present

## 2015-10-31 DIAGNOSIS — Z7982 Long term (current) use of aspirin: Secondary | ICD-10-CM

## 2015-10-31 DIAGNOSIS — R0902 Hypoxemia: Secondary | ICD-10-CM

## 2015-10-31 DIAGNOSIS — I160 Hypertensive urgency: Secondary | ICD-10-CM | POA: Diagnosis present

## 2015-10-31 DIAGNOSIS — F209 Schizophrenia, unspecified: Secondary | ICD-10-CM | POA: Diagnosis present

## 2015-10-31 DIAGNOSIS — R519 Headache, unspecified: Secondary | ICD-10-CM | POA: Diagnosis present

## 2015-10-31 DIAGNOSIS — M1990 Unspecified osteoarthritis, unspecified site: Secondary | ICD-10-CM | POA: Diagnosis present

## 2015-10-31 DIAGNOSIS — I130 Hypertensive heart and chronic kidney disease with heart failure and stage 1 through stage 4 chronic kidney disease, or unspecified chronic kidney disease: Secondary | ICD-10-CM | POA: Diagnosis present

## 2015-10-31 DIAGNOSIS — K589 Irritable bowel syndrome without diarrhea: Secondary | ICD-10-CM | POA: Diagnosis present

## 2015-10-31 DIAGNOSIS — E785 Hyperlipidemia, unspecified: Secondary | ICD-10-CM | POA: Diagnosis present

## 2015-10-31 DIAGNOSIS — Y832 Surgical operation with anastomosis, bypass or graft as the cause of abnormal reaction of the patient, or of later complication, without mention of misadventure at the time of the procedure: Secondary | ICD-10-CM | POA: Diagnosis present

## 2015-10-31 DIAGNOSIS — Z955 Presence of coronary angioplasty implant and graft: Secondary | ICD-10-CM

## 2015-10-31 DIAGNOSIS — N184 Chronic kidney disease, stage 4 (severe): Secondary | ICD-10-CM | POA: Diagnosis present

## 2015-10-31 DIAGNOSIS — Z8541 Personal history of malignant neoplasm of cervix uteri: Secondary | ICD-10-CM

## 2015-10-31 DIAGNOSIS — J449 Chronic obstructive pulmonary disease, unspecified: Secondary | ICD-10-CM | POA: Diagnosis present

## 2015-10-31 DIAGNOSIS — R51 Headache: Secondary | ICD-10-CM

## 2015-10-31 DIAGNOSIS — N179 Acute kidney failure, unspecified: Secondary | ICD-10-CM | POA: Diagnosis present

## 2015-10-31 DIAGNOSIS — I129 Hypertensive chronic kidney disease with stage 1 through stage 4 chronic kidney disease, or unspecified chronic kidney disease: Secondary | ICD-10-CM | POA: Diagnosis present

## 2015-10-31 DIAGNOSIS — I5032 Chronic diastolic (congestive) heart failure: Secondary | ICD-10-CM | POA: Diagnosis present

## 2015-10-31 DIAGNOSIS — R079 Chest pain, unspecified: Secondary | ICD-10-CM | POA: Diagnosis present

## 2015-10-31 DIAGNOSIS — I2781 Cor pulmonale (chronic): Secondary | ICD-10-CM | POA: Diagnosis present

## 2015-10-31 LAB — BASIC METABOLIC PANEL
ANION GAP: 12 (ref 5–15)
BUN: 19 mg/dL (ref 6–20)
CO2: 27 mmol/L (ref 22–32)
Calcium: 9.7 mg/dL (ref 8.9–10.3)
Chloride: 99 mmol/L — ABNORMAL LOW (ref 101–111)
Creatinine, Ser: 1.93 mg/dL — ABNORMAL HIGH (ref 0.44–1.00)
GFR calc non Af Amer: 29 mL/min — ABNORMAL LOW (ref >60)
GFR, EST AFRICAN AMERICAN: 33 mL/min — AB (ref >60)
Glucose, Bld: 109 mg/dL — ABNORMAL HIGH (ref 65–99)
Potassium: 3.4 mmol/L — ABNORMAL LOW (ref 3.5–5.1)
Sodium: 138 mmol/L (ref 135–145)

## 2015-10-31 LAB — CBC
HCT: 34.8 % — ABNORMAL LOW (ref 36.0–46.0)
Hemoglobin: 11.2 g/dL — ABNORMAL LOW (ref 12.0–15.0)
MCH: 29.5 pg (ref 26.0–34.0)
MCHC: 32.2 g/dL (ref 30.0–36.0)
MCV: 91.6 fL (ref 78.0–100.0)
Platelets: 351 10*3/uL (ref 150–400)
RBC: 3.8 MIL/uL — ABNORMAL LOW (ref 3.87–5.11)
RDW: 15.4 % (ref 11.5–15.5)
WBC: 7.9 10*3/uL (ref 4.0–10.5)

## 2015-10-31 NOTE — ED Notes (Signed)
Pt. reports headache / neck pain for several days unrelieved by OTC medication  with blurred vision , photophobia and nausea , denies head injury or fever ,  Hypertensive at triage .

## 2015-10-31 NOTE — ED Provider Notes (Signed)
By signing my name below, I, [name], attest that this documentation has been prepared under the direction and in the presence of [place], DO. Electronically Signed: [name], ED Scribe. 11/01/2015. 2:37 AM.  TIME SEEN: 12:01 AM  CHIEF COMPLAINT: headache  HPI: [name] is a 55 y.o. female with history of hypertension on metoprolol and Minipress, COPD not on oxygen who presents to the Emergency Department complaining of a constant, atraumatic, 9/10 in severity, waxing and waning, headache with onset 5 days ago. Her headaches are typically improved with Tylenol, but that has been ineffective for this pain. She last had pain like this before a CHF exacerbation. She denies any numbness, tingling or focal weakness.   She is also complaining of right-sided neck pain, productive cough, SOB for 5 days and intermittent sharp/tight/pressure-like chest pain (none at present, last episode near noon).   She is aware that her blood pressure has been elevated, but denies missing any doses of her metoprolol or prazosin. Pt denies LE swelling or pain, fever. She is not on a blood thinner. Her diuretic was stopped in July 2016.   PCP is Dr. Jonni Sanger with [facility].   ROS: See HPI Constitutional: no fever  Eyes: no drainage  ENT: no runny nose   Cardiovascular: chest pain  Resp: SOB  GI: no vomiting GU: no dysuria Integumentary: no rash  Allergy: no hives  Musculoskeletal: no leg swelling  Neurological: no slurred speech ROS otherwise negative  PAST MEDICAL HISTORY/PAST SURGICAL HISTORY:  Past Medical History  Diagnosis Date  . COPD (chronic obstructive pulmonary disease) ([place]) 2000  . Carpal tunnel syndrome   . Diverticulitis 2008  . Pancreatitis 10/2011  . Schizophrenia ([place])   . RLS (restless legs syndrome)   . Bipolar 1 disorder ([place])   . Small bowel obstruction ([place]) 2008  . Cervical cancer ([place]) 1985  . History of colon polyps 2009  . Anemia 2008  . Hypertension  2013  . Anxiety   . Asthma   . Depression 2000  . HLD (hyperlipidemia) 2013  . IBS (irritable bowel syndrome) 2008  . Shortness of breath   . Coronary artery disease   . Heart murmur   . GERD (gastroesophageal reflux disease)   . Daily headache   . Arthritis     "all my joints ache" (09/07/2014)  . Chronic kidney disease (CKD), stage V ([place])   . Pneumonia 07/2014    MEDICATIONS:  Prior to Admission medications   Medication Sig Start Date End Date Taking? Authorizing Provider  acetaminophen (TYLENOL) 500 MG tablet Take 2,000 mg by mouth every 6 (six) hours as needed for headache.    Historical Provider, MD  albuterol (PROVENTIL HFA;VENTOLIN HFA) 108 (90 BASE) MCG/ACT inhaler Inhale 2 puffs into the lungs 2 (two) times daily as needed (asthma).     Historical Provider, MD  ALPRAZolam Duanne Moron) 0.5 MG tablet Take 1 mg by mouth 3 (three) times daily as needed for anxiety.     Historical Provider, MD  amLODipine (NORVASC) 10 MG tablet Take 1 tablet (10 mg total) by mouth daily. 07/04/14   Lucious Groves, DO  aspirin 81 MG tablet Take 1 tablet (81 mg total) by mouth daily. 09/08/14   Adrian Prows, MD  cholecalciferol (VITAMIN D) 1000 UNITS tablet Take 1,000 Units by mouth daily.    Historical Provider, MD  clopidogrel (PLAVIX) 75 MG tablet Take 1 tablet (75 mg total) by mouth daily. 09/08/14   Adrian Prows, MD  docusate sodium 100 MG CAPS Take 100 mg by mouth 2 (two) times daily. 07/04/14   Lucious Groves, DO  famotidine (PEPCID) 20 MG tablet Take 1 tablet (20 mg total) by mouth 2 (two) times daily. 09/08/14   Adrian Prows, MD  feeding supplement, RESOURCE BREEZE, (RESOURCE BREEZE) LIQD Take 1 Container by mouth 3 (three) times daily between meals. 07/04/14   Lucious Groves, DO  Fluticasone-Salmeterol (ADVAIR) 500-50 MCG/DOSE AEPB Inhale 1 puff into the lungs 2 (two) times daily.    Historical Provider, MD  gabapentin (NEURONTIN) 400 MG capsule Take 1 capsule (400 mg total) by mouth 2 (two) times daily.  Patient takes 400 mg 3 times daily and 1200 mg at bedtime Patient taking differently: Take 400 mg by mouth 2 (two) times daily.  07/04/14   Lucious Groves, DO  metoprolol (LOPRESSOR) 50 MG tablet Take 50 mg by mouth 2 (two) times daily.    Historical Provider, MD  pantoprazole (PROTONIX) 40 MG tablet Take 40 mg by mouth daily.    Historical Provider, MD  prazosin (MINIPRESS) 2 MG capsule Take 2 mg by mouth at bedtime.    Historical Provider, MD  venlafaxine XR (EFFEXOR-XR) 150 MG 24 hr capsule Take 300 mg by mouth daily with breakfast.    Historical Provider, MD    ALLERGIES:  Allergies  Allergen Reactions  . Doxycycline Anaphylaxis and Hives  . Aspirin Other (See Comments)    Internal bleeding  . Hydrocodone-Acetaminophen Nausea And Vomiting  . Iohexol Itching    Pt has itching nose after iv contrast injection    SOCIAL HISTORY:  Social History  Substance Use Topics  . Smoking status: Current Every Day Smoker -- 0.50 packs/day for 40 years    Types: Cigarettes  . Smokeless tobacco: Never Used  . Alcohol Use: No    FAMILY HISTORY: Family History  Problem Relation Age of Onset  . Other Mother     many bowel obstructions  . Kidney cancer Father   . Bone cancer Father   . Diabetes Father   . Diabetes Daughter   . Heart disease Mother   . Heart disease Father     EXAM: BP 226/95 mmHg  Pulse 64  Temp(Src) 97.9 F (36.6 C) (Oral)  Resp 16  SpO2 97% CONSTITUTIONAL: Alert and oriented and responds appropriately to questions. chronically-ill appearing; well-nourished HEAD: Normocephalic EYES: Conjunctivae clear, PERRL ENT: normal nose; no rhinorrhea; moist mucous membranes; pharynx without lesions noted NECK: Supple, no meningismus, no LAD, no JVD but pt does have bruit when auscultation carotid artery on the right side, no bruit on the left; no nuchal rigidity CARD: RRR; S1 and S2 appreciated; no murmurs, no clicks, no rubs, no gallops RESP: Normal chest excursion without  splinting or tachypnea; breath sounds equal bilaterally; very mild intermittent scattered wheezing and more persistent localized wheezing in the right lower lobe , no rhonchi, no rales, no hypoxia or respiratory distress, speaking full sentences ABD/GI: Normal bowel sounds; non-distended; soft, non-tender, no rebound, no guarding, no peritoneal signs BACK:  The back appears normal and is non-tender to palpation, there is no CVA tenderness EXT: Normal ROM in all joints; non-tender to palpation; no edema; normal capillary refill; no cyanosis, no calf tenderness or swelling    SKIN: Normal color for age and race; warm NEURO: Moves all extremities equally, sensation to light touch intact diffusely, cranial nerves II through XII intact PSYCH: [name] mood and manner are appropriate. Grooming and personal hygiene  are appropriate.  MEDICAL DECISION MAKING: Patient here with multiple complaints. She states she has had a headache for the past 5 days. Not sudden onset, thunderclap but she does describe it as severe. Reports she has had similar headaches in the past. Is noted to be extremely hypertensive which is new for her. Reports she has been taking her blood pressure medication as prescribed. No new neurologic deficits. No head injury. Not on anticoagulation. Will treat with morphine for pain control and see if this improves her headache as well as her blood pressure. We'll also obtain a head CT.   Patient also complaining of chest pain. She does have risk factors for ACS. No chest pain currently. EKG shows no new ischemic changes. Concern for hypertensive urgency as well. She is also short of breath with some mild wheezing. We'll give DuoNeb, Solu-Medrol and reassess.  ED PROGRESS: Patient's labs show chronic kidney disease which is unchanged compared to prior. His urine does show trace hematuria and proteinuria which is new. Troponin is negative. Chest x-ray clear. Head CT negative for intracranial  hemorrhage, mass or stroke.  1:31 AM I re-evaluated the patient and her headache has improved to 5/10 in severity. Blood pressure is 225/100 at this time. Pt had O2 saturation at 88% on RA when I entered the room. She was placed on 2L with improvement to 95%. He does not have an oxygen requirement at home. We'll give second dose of IV pain medication as well as IV hydralazine for her persistent hypertension.  2:32 AM-Consult complete with Dr. Hal Hope (Hospitalist). Patient case explained and discussed. Agrees to admit patient for further evaluation and treatment, inpatient telemetry. Patient's blood pressure has improved to 195/95. We'll hold on further blood pressure treatment, drips at this time and hospitalist agrees. Here transferred to hospitalist service for further management. Patient and her daughter updated with plan.   EKG Interpretation  Date/Time:  Tuesday November 01 2015 00:03:26 EST Ventricular Rate:  64 PR Interval:  135 QRS Duration: 91 QT Interval:  473 QTC Calculation: 488 R Axis:   86 Text Interpretation:  Sinus rhythm Left atrial enlargement Probable left ventricular hypertrophy Anterior Q waves, possibly due to LVH No significant change since last tracing Confirmed by WARD,  DO, KRISTEN [id number]) on 11/01/2015 12:05:53 AM       I personally performed the services described in this documentation, which was scribed in my presence. The recorded information has been reviewed and is accurate.      [place], DO 11/01/15 [id number]

## 2015-11-01 ENCOUNTER — Emergency Department (HOSPITAL_COMMUNITY): Payer: Medicare Other

## 2015-11-01 ENCOUNTER — Encounter (HOSPITAL_COMMUNITY)
Admission: EM | Disposition: A | Discharge status: Home / Self Care | Payer: Self-pay | Source: Home / Self Care | Attending: Internal Medicine

## 2015-11-01 ENCOUNTER — Encounter (HOSPITAL_COMMUNITY): Payer: Self-pay | Admitting: Internal Medicine

## 2015-11-01 DIAGNOSIS — N184 Chronic kidney disease, stage 4 (severe): Secondary | ICD-10-CM | POA: Diagnosis present

## 2015-11-01 DIAGNOSIS — F209 Schizophrenia, unspecified: Secondary | ICD-10-CM | POA: Diagnosis present

## 2015-11-01 DIAGNOSIS — Z7982 Long term (current) use of aspirin: Secondary | ICD-10-CM | POA: Diagnosis not present

## 2015-11-01 DIAGNOSIS — M1990 Unspecified osteoarthritis, unspecified site: Secondary | ICD-10-CM | POA: Diagnosis present

## 2015-11-01 DIAGNOSIS — F419 Anxiety disorder, unspecified: Secondary | ICD-10-CM | POA: Diagnosis present

## 2015-11-01 DIAGNOSIS — I2781 Cor pulmonale (chronic): Secondary | ICD-10-CM | POA: Diagnosis present

## 2015-11-01 DIAGNOSIS — I701 Atherosclerosis of renal artery: Secondary | ICD-10-CM | POA: Diagnosis present

## 2015-11-01 DIAGNOSIS — R51 Headache: Secondary | ICD-10-CM

## 2015-11-01 DIAGNOSIS — R079 Chest pain, unspecified: Secondary | ICD-10-CM

## 2015-11-01 DIAGNOSIS — K589 Irritable bowel syndrome without diarrhea: Secondary | ICD-10-CM | POA: Diagnosis present

## 2015-11-01 DIAGNOSIS — Z8541 Personal history of malignant neoplasm of cervix uteri: Secondary | ICD-10-CM | POA: Diagnosis not present

## 2015-11-01 DIAGNOSIS — N179 Acute kidney failure, unspecified: Secondary | ICD-10-CM | POA: Diagnosis present

## 2015-11-01 DIAGNOSIS — I130 Hypertensive heart and chronic kidney disease with heart failure and stage 1 through stage 4 chronic kidney disease, or unspecified chronic kidney disease: Secondary | ICD-10-CM | POA: Diagnosis present

## 2015-11-01 DIAGNOSIS — I5032 Chronic diastolic (congestive) heart failure: Secondary | ICD-10-CM | POA: Diagnosis present

## 2015-11-01 DIAGNOSIS — Z7902 Long term (current) use of antithrombotics/antiplatelets: Secondary | ICD-10-CM | POA: Diagnosis not present

## 2015-11-01 DIAGNOSIS — Z79899 Other long term (current) drug therapy: Secondary | ICD-10-CM | POA: Diagnosis not present

## 2015-11-01 DIAGNOSIS — K219 Gastro-esophageal reflux disease without esophagitis: Secondary | ICD-10-CM | POA: Diagnosis present

## 2015-11-01 DIAGNOSIS — R0602 Shortness of breath: Secondary | ICD-10-CM | POA: Diagnosis present

## 2015-11-01 DIAGNOSIS — E785 Hyperlipidemia, unspecified: Secondary | ICD-10-CM | POA: Diagnosis present

## 2015-11-01 DIAGNOSIS — T82858A Stenosis of vascular prosthetic devices, implants and grafts, initial encounter: Secondary | ICD-10-CM | POA: Diagnosis present

## 2015-11-01 DIAGNOSIS — Y832 Surgical operation with anastomosis, bypass or graft as the cause of abnormal reaction of the patient, or of later complication, without mention of misadventure at the time of the procedure: Secondary | ICD-10-CM | POA: Diagnosis present

## 2015-11-01 DIAGNOSIS — I160 Hypertensive urgency: Secondary | ICD-10-CM | POA: Diagnosis present

## 2015-11-01 DIAGNOSIS — F17210 Nicotine dependence, cigarettes, uncomplicated: Secondary | ICD-10-CM | POA: Diagnosis present

## 2015-11-01 DIAGNOSIS — I129 Hypertensive chronic kidney disease with stage 1 through stage 4 chronic kidney disease, or unspecified chronic kidney disease: Secondary | ICD-10-CM | POA: Diagnosis present

## 2015-11-01 DIAGNOSIS — J45909 Unspecified asthma, uncomplicated: Secondary | ICD-10-CM | POA: Diagnosis present

## 2015-11-01 DIAGNOSIS — J449 Chronic obstructive pulmonary disease, unspecified: Secondary | ICD-10-CM | POA: Diagnosis present

## 2015-11-01 DIAGNOSIS — I2510 Atherosclerotic heart disease of native coronary artery without angina pectoris: Secondary | ICD-10-CM | POA: Diagnosis present

## 2015-11-01 DIAGNOSIS — Z955 Presence of coronary angioplasty implant and graft: Secondary | ICD-10-CM | POA: Diagnosis not present

## 2015-11-01 DIAGNOSIS — R519 Headache, unspecified: Secondary | ICD-10-CM | POA: Diagnosis present

## 2015-11-01 DIAGNOSIS — F319 Bipolar disorder, unspecified: Secondary | ICD-10-CM | POA: Diagnosis present

## 2015-11-01 HISTORY — PX: PERIPHERAL VASCULAR CATHETERIZATION: SHX172C

## 2015-11-01 LAB — URINE MICROSCOPIC-ADD ON

## 2015-11-01 LAB — COMPREHENSIVE METABOLIC PANEL
ALT: 16 U/L (ref 14–54)
ANION GAP: 9 (ref 5–15)
AST: 29 U/L (ref 15–41)
Albumin: 4 g/dL (ref 3.5–5.0)
Alkaline Phosphatase: 106 U/L (ref 38–126)
BILIRUBIN TOTAL: 0.3 mg/dL (ref 0.3–1.2)
BUN: 21 mg/dL — AB (ref 6–20)
CHLORIDE: 99 mmol/L — AB (ref 101–111)
CO2: 27 mmol/L (ref 22–32)
Calcium: 9.5 mg/dL (ref 8.9–10.3)
Creatinine, Ser: 2.08 mg/dL — ABNORMAL HIGH (ref 0.44–1.00)
GFR, EST AFRICAN AMERICAN: 30 mL/min — AB (ref >60)
GFR, EST NON AFRICAN AMERICAN: 26 mL/min — AB (ref >60)
Glucose, Bld: 164 mg/dL — ABNORMAL HIGH (ref 65–99)
POTASSIUM: 3.6 mmol/L (ref 3.5–5.1)
Sodium: 135 mmol/L (ref 135–145)
TOTAL PROTEIN: 7.3 g/dL (ref 6.5–8.1)

## 2015-11-01 LAB — CBC WITH DIFFERENTIAL/PLATELET
BASOS ABS: 0.1 10*3/uL (ref 0.0–0.1)
Basophils Relative: 1 %
EOS ABS: 0.1 10*3/uL (ref 0.0–0.7)
EOS PCT: 1 %
HCT: 34.4 % — ABNORMAL LOW (ref 36.0–46.0)
Hemoglobin: 11.5 g/dL — ABNORMAL LOW (ref 12.0–15.0)
LYMPHS ABS: 0.5 10*3/uL — AB (ref 0.7–4.0)
LYMPHS PCT: 7 %
MCH: 30.9 pg (ref 26.0–34.0)
MCHC: 33.4 g/dL (ref 30.0–36.0)
MCV: 92.5 fL (ref 78.0–100.0)
Monocytes Absolute: 0.1 10*3/uL (ref 0.1–1.0)
Monocytes Relative: 1 %
NEUTROS PCT: 90 %
Neutro Abs: 6.5 10*3/uL (ref 1.7–7.7)
PLATELETS: 314 10*3/uL (ref 150–400)
RBC: 3.72 MIL/uL — ABNORMAL LOW (ref 3.87–5.11)
RDW: 15.6 % — AB (ref 11.5–15.5)
WBC: 7.2 10*3/uL (ref 4.0–10.5)

## 2015-11-01 LAB — POCT ACTIVATED CLOTTING TIME
ACTIVATED CLOTTING TIME: 281 s
Activated Clotting Time: 147 seconds
Activated Clotting Time: 198 seconds
Activated Clotting Time: 193 seconds
Activated Clotting Time: 178 seconds

## 2015-11-01 LAB — URINALYSIS, ROUTINE W REFLEX MICROSCOPIC
Bilirubin Urine: NEGATIVE
Glucose, UA: NEGATIVE mg/dL
Ketones, ur: NEGATIVE mg/dL
LEUKOCYTES UA: NEGATIVE
NITRITE: NEGATIVE
Protein, ur: >300 mg/dL — AB
SPECIFIC GRAVITY, URINE: 1.008 (ref 1.005–1.030)
pH: 6.5 (ref 5.0–8.0)

## 2015-11-01 LAB — TROPONIN I: Troponin I: <0.03 ng/mL (ref <0.031)

## 2015-11-01 LAB — BRAIN NATRIURETIC PEPTIDE: B Natriuretic Peptide: 140.2 pg/mL — ABNORMAL HIGH (ref 0.0–100.0)

## 2015-11-01 LAB — SEDIMENTATION RATE: Sed Rate: 49 mm/hr — ABNORMAL HIGH (ref 0–22)

## 2015-11-01 SURGERY — RENAL ANGIOGRAPHY

## 2015-11-01 MED ORDER — GABAPENTIN 400 MG PO CAPS
400.0000 mg | ORAL_CAPSULE | Freq: Two times a day (BID) | ORAL | Status: DC
  Administered 2015-11-01 – 2015-11-02 (×3): 400 mg via ORAL
  Filled 2015-11-01 (×3): qty 1

## 2015-11-01 MED ORDER — AMLODIPINE BESYLATE 5 MG PO TABS
5.0000 mg | ORAL_TABLET | ORAL | Status: AC
  Administered 2015-11-01: 5 mg via ORAL
  Filled 2015-11-01: qty 1

## 2015-11-01 MED ORDER — SODIUM CHLORIDE 0.9 % IV SOLN: 1.0000 mL/kg/h | INTRAVENOUS | Status: AC

## 2015-11-01 MED ORDER — LABETALOL HCL 5 MG/ML IV SOLN
INTRAVENOUS | Status: AC
  Filled 2015-11-01: qty 4

## 2015-11-01 MED ORDER — HEPARIN (PORCINE) IN NACL 2-0.9 UNIT/ML-% IJ SOLN
INTRAMUSCULAR | Status: DC | PRN
  Administered 2015-11-01: 15 mL

## 2015-11-01 MED ORDER — METOPROLOL TARTRATE 50 MG PO TABS
50.0000 mg | ORAL_TABLET | Freq: Two times a day (BID) | ORAL | Status: DC
Start: 2015-11-01 — End: 2015-11-01
  Administered 2015-11-01: 50 mg via ORAL
  Filled 2015-11-01: qty 1

## 2015-11-01 MED ORDER — AMLODIPINE BESYLATE 5 MG PO TABS
5.0000 mg | ORAL_TABLET | Freq: Every day | ORAL | Status: DC
  Administered 2015-11-01: 5 mg via ORAL
  Filled 2015-11-01: qty 1

## 2015-11-01 MED ORDER — CLOPIDOGREL BISULFATE 75 MG PO TABS
75.0000 mg | ORAL_TABLET | Freq: Every day | ORAL | Status: DC
  Administered 2015-11-01 – 2015-11-02 (×2): 75 mg via ORAL
  Filled 2015-11-01 (×2): qty 1

## 2015-11-01 MED ORDER — ALBUTEROL SULFATE (2.5 MG/3ML) 0.083% IN NEBU: 2.5000 mg | INHALATION_SOLUTION | RESPIRATORY_TRACT | Status: DC

## 2015-11-01 MED ORDER — METHYLPREDNISOLONE SODIUM SUCC 125 MG IJ SOLR
125.0000 mg | Freq: Once | INTRAMUSCULAR | Status: AC
  Administered 2015-11-01: 125 mg via INTRAVENOUS
  Filled 2015-11-01: qty 2

## 2015-11-01 MED ORDER — MIDAZOLAM HCL 2 MG/2ML IJ SOLN
INTRAMUSCULAR | Status: DC | PRN
  Administered 2015-11-01: 1 mg via INTRAVENOUS

## 2015-11-01 MED ORDER — CILOSTAZOL 100 MG PO TABS
100.0000 mg | ORAL_TABLET | Freq: Two times a day (BID) | ORAL | Status: DC
  Administered 2015-11-01: 100 mg via ORAL
  Filled 2015-11-01 (×3): qty 1

## 2015-11-01 MED ORDER — HYDRALAZINE HCL 20 MG/ML IJ SOLN: 10.0000 mg | INTRAMUSCULAR | Status: DC | PRN

## 2015-11-01 MED ORDER — HEPARIN SODIUM (PORCINE) 1000 UNIT/ML IJ SOLN
INTRAMUSCULAR | Status: AC
  Filled 2015-11-01: qty 1

## 2015-11-01 MED ORDER — SODIUM BICARBONATE BOLUS VIA INFUSION
INTRAVENOUS | Status: AC
  Administered 2015-11-01: 12:00:00 via INTRAVENOUS
  Filled 2015-11-01 (×2): qty 1

## 2015-11-01 MED ORDER — AMLODIPINE BESYLATE 10 MG PO TABS: 10.0000 mg | ORAL_TABLET | Freq: Every day | ORAL | Status: DC

## 2015-11-01 MED ORDER — MIDAZOLAM HCL 2 MG/2ML IJ SOLN
INTRAMUSCULAR | Status: AC
  Filled 2015-11-01: qty 2

## 2015-11-01 MED ORDER — IPRATROPIUM BROMIDE 0.02 % IN SOLN: 0.5000 mg | RESPIRATORY_TRACT | Status: DC

## 2015-11-01 MED ORDER — PANTOPRAZOLE SODIUM 40 MG PO TBEC
40.0000 mg | DELAYED_RELEASE_TABLET | Freq: Every day | ORAL | Status: DC
  Administered 2015-11-01 – 2015-11-02 (×2): 40 mg via ORAL
  Filled 2015-11-01 (×2): qty 1

## 2015-11-01 MED ORDER — ALBUTEROL SULFATE (2.5 MG/3ML) 0.083% IN NEBU: 2.5000 mg | INHALATION_SOLUTION | RESPIRATORY_TRACT | Status: DC | PRN

## 2015-11-01 MED ORDER — IPRATROPIUM-ALBUTEROL 0.5-2.5 (3) MG/3ML IN SOLN
RESPIRATORY_TRACT | Status: AC
  Filled 2015-11-01: qty 3

## 2015-11-01 MED ORDER — HYDROMORPHONE HCL 1 MG/ML IJ SOLN
INTRAMUSCULAR | Status: AC
  Filled 2015-11-01: qty 1

## 2015-11-01 MED ORDER — LABETALOL HCL 5 MG/ML IV SOLN
INTRAVENOUS | Status: DC | PRN
  Administered 2015-11-01 (×2): 15 mg via INTRAVENOUS

## 2015-11-01 MED ORDER — ONDANSETRON HCL 4 MG PO TABS: 4.0000 mg | ORAL_TABLET | Freq: Four times a day (QID) | ORAL | Status: DC | PRN

## 2015-11-01 MED ORDER — FENTANYL CITRATE (PF) 100 MCG/2ML IJ SOLN: INTRAMUSCULAR | Status: DC | PRN

## 2015-11-01 MED ORDER — ALPRAZOLAM 0.5 MG PO TABS
1.0000 mg | ORAL_TABLET | Freq: Three times a day (TID) | ORAL | Status: DC | PRN
  Administered 2015-11-01 – 2015-11-02 (×2): 1 mg via ORAL
  Filled 2015-11-01 (×2): qty 2

## 2015-11-01 MED ORDER — VENLAFAXINE HCL ER 150 MG PO CP24
300.0000 mg | ORAL_CAPSULE | Freq: Every day | ORAL | Status: DC
  Administered 2015-11-01 – 2015-11-02 (×2): 300 mg via ORAL
  Filled 2015-11-01 (×3): qty 2

## 2015-11-01 MED ORDER — FAMOTIDINE 20 MG PO TABS
20.0000 mg | ORAL_TABLET | Freq: Two times a day (BID) | ORAL | Status: DC
  Administered 2015-11-01 – 2015-11-02 (×3): 20 mg via ORAL
  Filled 2015-11-01 (×3): qty 1

## 2015-11-01 MED ORDER — IPRATROPIUM-ALBUTEROL 0.5-2.5 (3) MG/3ML IN SOLN: 3.0000 mL | Freq: Four times a day (QID) | RESPIRATORY_TRACT | Status: DC

## 2015-11-01 MED ORDER — DEXTROSE 5 % IV SOLN
Freq: Once | INTRAVENOUS | Status: AC
  Administered 2015-11-01: 22:00:00 via INTRAVENOUS
  Filled 2015-11-01 (×2): qty 1000

## 2015-11-01 MED ORDER — MORPHINE SULFATE (PF) 4 MG/ML IV SOLN
4.0000 mg | Freq: Once | INTRAVENOUS | Status: AC
  Administered 2015-11-01: 4 mg via INTRAVENOUS
  Filled 2015-11-01: qty 1

## 2015-11-01 MED ORDER — IPRATROPIUM-ALBUTEROL 0.5-2.5 (3) MG/3ML IN SOLN
3.0000 mL | Freq: Once | RESPIRATORY_TRACT | Status: AC
  Administered 2015-11-01: 3 mL via RESPIRATORY_TRACT
  Filled 2015-11-01: qty 3

## 2015-11-01 MED ORDER — HYDRALAZINE HCL 20 MG/ML IJ SOLN
10.0000 mg | Freq: Once | INTRAMUSCULAR | Status: AC
  Administered 2015-11-01: 10 mg via INTRAVENOUS
  Filled 2015-11-01: qty 1

## 2015-11-01 MED ORDER — MOMETASONE FURO-FORMOTEROL FUM 200-5 MCG/ACT IN AERO
2.0000 | INHALATION_SPRAY | Freq: Two times a day (BID) | RESPIRATORY_TRACT | Status: DC
Start: 2015-11-01 — End: 2015-11-02
  Administered 2015-11-01 – 2015-11-02 (×3): 2 via RESPIRATORY_TRACT
  Filled 2015-11-01: qty 8.8

## 2015-11-01 MED ORDER — SODIUM CHLORIDE 0.9 % IV SOLN
INTRAVENOUS | Status: DC
  Administered 2015-11-01 – 2015-11-02 (×2): via INTRAVENOUS

## 2015-11-01 MED ORDER — NITROGLYCERIN 0.4 MG SL SUBL
0.4000 mg | SUBLINGUAL_TABLET | SUBLINGUAL | Status: DC | PRN
Start: 2015-11-01 — End: 2015-11-02

## 2015-11-01 MED ORDER — IODIXANOL 320 MG/ML IV SOLN
INTRAVENOUS | Status: DC | PRN
  Administered 2015-11-01 (×2): 8 mL via INTRA_ARTERIAL

## 2015-11-01 MED ORDER — METOPROLOL TARTRATE 50 MG PO TABS
50.0000 mg | ORAL_TABLET | Freq: Two times a day (BID) | ORAL | Status: DC
  Administered 2015-11-02: 50 mg via ORAL
  Filled 2015-11-01: qty 1

## 2015-11-01 MED ORDER — DIPHENHYDRAMINE HCL 50 MG/ML IJ SOLN
25.0000 mg | Freq: Once | INTRAMUSCULAR | Status: AC
  Administered 2015-11-01: 25 mg via INTRAVENOUS
  Filled 2015-11-01: qty 1

## 2015-11-01 MED ORDER — ONDANSETRON HCL 4 MG/2ML IJ SOLN: 4.0000 mg | Freq: Four times a day (QID) | INTRAMUSCULAR | Status: DC | PRN

## 2015-11-01 MED ORDER — SODIUM CHLORIDE 0.9 % IV SOLN
INTRAVENOUS | Status: DC | PRN
  Administered 2015-11-01: 10 mL/h via INTRAVENOUS

## 2015-11-01 MED ORDER — ASPIRIN 81 MG PO TABS: 81.0000 mg | ORAL_TABLET | Freq: Every day | ORAL | Status: DC

## 2015-11-01 MED ORDER — HYDRALAZINE HCL 20 MG/ML IJ SOLN
INTRAMUSCULAR | Status: AC
  Filled 2015-11-01: qty 1

## 2015-11-01 MED ORDER — HYDROMORPHONE HCL 1 MG/ML IJ SOLN
INTRAMUSCULAR | Status: DC | PRN
  Administered 2015-11-01: 0.5 mg via INTRAVENOUS

## 2015-11-01 MED ORDER — HYDRALAZINE HCL 20 MG/ML IJ SOLN
INTRAMUSCULAR | Status: DC | PRN
  Administered 2015-11-01: 10 mg via INTRAVENOUS

## 2015-11-01 MED ORDER — AMLODIPINE BESYLATE 10 MG PO TABS
10.0000 mg | ORAL_TABLET | Freq: Every day | ORAL | Status: DC
  Administered 2015-11-02: 10 mg via ORAL
  Filled 2015-11-01: qty 1

## 2015-11-01 MED ORDER — ASPIRIN EC 325 MG PO TBEC
325.0000 mg | DELAYED_RELEASE_TABLET | Freq: Every day | ORAL | Status: DC
  Administered 2015-11-01: 325 mg via ORAL
  Filled 2015-11-01: qty 1

## 2015-11-01 MED ORDER — ATORVASTATIN CALCIUM 80 MG PO TABS
80.0000 mg | ORAL_TABLET | Freq: Every day | ORAL | Status: DC
  Administered 2015-11-01 – 2015-11-02 (×2): 80 mg via ORAL
  Filled 2015-11-01 (×2): qty 1

## 2015-11-01 MED ORDER — MORPHINE SULFATE (PF) 2 MG/ML IV SOLN: 1.0000 mg | INTRAVENOUS | Status: DC | PRN

## 2015-11-01 MED ORDER — HEPARIN SODIUM (PORCINE) 1000 UNIT/ML IJ SOLN
INTRAMUSCULAR | Status: DC | PRN
  Administered 2015-11-01: 3000 [IU] via INTRAVENOUS
  Administered 2015-11-01: 4000 [IU] via INTRAVENOUS

## 2015-11-01 MED ORDER — DOCUSATE SODIUM 100 MG PO CAPS
100.0000 mg | ORAL_CAPSULE | Freq: Two times a day (BID) | ORAL | Status: DC
  Administered 2015-11-01 – 2015-11-02 (×3): 100 mg via ORAL
  Filled 2015-11-01 (×3): qty 1

## 2015-11-01 MED ORDER — IPRATROPIUM-ALBUTEROL 0.5-2.5 (3) MG/3ML IN SOLN
3.0000 mL | Freq: Three times a day (TID) | RESPIRATORY_TRACT | Status: DC
  Administered 2015-11-01: 3 mL via RESPIRATORY_TRACT

## 2015-11-01 MED ORDER — ONDANSETRON HCL 4 MG/2ML IJ SOLN
4.0000 mg | Freq: Once | INTRAMUSCULAR | Status: AC
  Administered 2015-11-01: 4 mg via INTRAVENOUS
  Filled 2015-11-01: qty 2

## 2015-11-01 SURGICAL SUPPLY — 18 items
BALLN ANGIOSCULPT OTW 6X20 (BALLOONS) ×2
BALLOON ANGIOSCULPT OTW 6X20 (BALLOONS) IMPLANT
CATH ANGIO 5F PIGTAIL 100CM (CATHETERS) ×1 IMPLANT
CATH CROSS OVER TEMPO 5F (CATHETERS) ×2 IMPLANT
FILTER CO2 0.2 MICRON (VASCULAR PRODUCTS) ×1 IMPLANT
GUIDE CATH VISTA IMA 6F (CATHETERS) ×1 IMPLANT
KIT ENCORE 26 ADVANTAGE (KITS) ×2 IMPLANT
KIT MICROINTRODUCER STIFF 5F (SHEATH) ×1 IMPLANT
KIT PV (KITS) ×2 IMPLANT
RESERVOIR CO2 (VASCULAR PRODUCTS) ×1 IMPLANT
SET FLUSH CO2 (MISCELLANEOUS) ×1 IMPLANT
SHEATH PINNACLE 6F 10CM (SHEATH) ×1 IMPLANT
SYR MEDRAD MARK V 150ML (SYRINGE) ×2 IMPLANT
TRANSDUCER W/STOPCOCK (MISCELLANEOUS) ×2 IMPLANT
TRAY PV CATH (CUSTOM PROCEDURE TRAY) ×2 IMPLANT
TUBING CIL FLEX 10 FLL-RA (TUBING) ×1 IMPLANT
WIRE HITORQ VERSACORE ST 145CM (WIRE) ×1 IMPLANT
WIRE STABILIZER XS .014X180CM (WIRE) ×1 IMPLANT

## 2015-11-01 NOTE — Progress Notes (Signed)
Site area: RFA Site Prior to Removal:  Level 0 Pressure Applied For:54min Manual:   yes Patient Status During Pull: stable  Post Pull Site:  Level 0 Post Pull Instructions Given: yes  Post Pull Pulses Present: palpable Dressing Applied: clear  Bedrest begins @ 1820 till 2220 Comments:  RFA

## 2015-11-01 NOTE — Consult Note (Signed)
CARDIOLOGY CONSULT NOTE  [name] MRN: [id number] DOB/AGE: 53-Aug-1962 53 y.o.  Admit date: 10/31/2015 Referring Physician  Paulino Door, MD Primary Physician:  Leamon Arnt, MD Reason for Consultation  Chest Pain  HPI: [name]  is a 55 y.o. female caucasian female with COPD, diastolic CHF, tobacco abuse, and difficult to control hypertension with CKD IV which had resolved with improvement in eGFR from 22 to >50 since angioplasty to her renal arteries on 09/07/2014. She was last seen in the office on 05/13/2015 at which time she was doing well, blood pressure was controlled and renal function was stable (Crea 1.25/eGFR 50). She has h/o chronic exertional chest pain suggestive of angina pectoris with normal coronary arteries which is NTG responsive. She is now admitted with hypertensive urgency, chest pain, and headache. No elevation in cardiac enzymes, no acute EKG changes.   For the past 2 days she has not been feeling well, had diffuse chest discomfort and sharp chest pains but states that not enough to take nitroglycerin. She felt that her blood pressure was elevated and presented to the emergency room. Presently she is asymptomatic without any chest pain.   Past Medical History  Diagnosis Date  . COPD (chronic obstructive pulmonary disease) ([place]) 2000  . Carpal tunnel syndrome   . Diverticulitis 2008  . Pancreatitis 10/2011  . Schizophrenia ([place])   . RLS (restless legs syndrome)   . Bipolar 1 disorder ([place])   . Small bowel obstruction ([place]) 2008  . Cervical cancer ([place]) 1985  . History of colon polyps 2009  . Anemia 2008  . Hypertension 2013  . Anxiety   . Asthma   . Depression 2000  . HLD (hyperlipidemia) 2013  . IBS (irritable bowel syndrome) 2008  . Shortness of breath   . Coronary artery disease   . Heart murmur   . GERD (gastroesophageal reflux disease)   . Daily headache   . Arthritis     "all my joints ache" (09/07/2014)  . Chronic kidney  disease (CKD), stage V ([place])   . Pneumonia 07/2014     Past Surgical History  Procedure Laterality Date  . Colon surgery  2008    6 inches of colon removed due to obstruction  . Right oophorectomy Right 1996  . Total abdominal hysterectomy  1997  . Coronary angioplasty with stent placement  09/07/2014    "2"  . Appendectomy  1996  . Left heart catheterization with coronary angiogram N/A 09/07/2014    Procedure: LEFT HEART CATHETERIZATION WITH CORONARY ANGIOGRAM;  Surgeon: Laverda Page, MD;  Location: [facility] CATH LAB;  Service: Cardiovascular;  Laterality: N/A;  . Abdominal angiogram N/A 09/07/2014    Procedure: ABDOMINAL ANGIOGRAM;  Surgeon: Laverda Page, MD;  Location: [facility] CATH LAB;  Service: Cardiovascular;  Laterality: N/A;     Family History  Problem Relation Age of Onset  . Other Mother     many bowel obstructions  . Kidney cancer Father   . Bone cancer Father   . Diabetes Father   . Diabetes Daughter   . Heart disease Mother   . Heart disease Father      Social History: Social History   Social History  . Marital Status: Divorced    Spouse Name: N/A  . Number of Children: 1  . Years of Education: N/A   Occupational History  . grill cook/cashier    Social History Main Topics  . Smoking status: Current Every Day Smoker -- 0.50  packs/day for 40 years    Types: Cigarettes  . Smokeless tobacco: Never Used  . Alcohol Use: No  . Drug Use: No  . Sexual Activity: Yes    Birth Control/ Protection: Post-menopausal   Other Topics Concern  . Not on file   Social History Narrative     Prescriptions prior to admission  Medication Sig Dispense Refill Last Dose  . acetaminophen (TYLENOL) 500 MG tablet Take 2,000 mg by mouth every 6 (six) hours as needed for headache.   09/06/2014 at Unknown time  . albuterol (PROVENTIL HFA;VENTOLIN HFA) 108 (90 BASE) MCG/ACT inhaler Inhale 2 puffs into the lungs 2 (two) times daily as needed (asthma).    09/07/2014 at Unknown time  .  ALPRAZolam (XANAX) 0.5 MG tablet Take 1 mg by mouth 3 (three) times daily as needed for anxiety.    Past Week at Unknown time  . amLODipine (NORVASC) 10 MG tablet Take 1 tablet (10 mg total) by mouth daily. 30 tablet 0 09/07/2014 at 0800  . aspirin 81 MG tablet Take 1 tablet (81 mg total) by mouth daily. 360 tablet 0   . cholecalciferol (VITAMIN D) 1000 UNITS tablet Take 1,000 Units by mouth daily.   Past Month at Unknown time  . clopidogrel (PLAVIX) 75 MG tablet Take 1 tablet (75 mg total) by mouth daily. 30 tablet 0   . docusate sodium 100 MG CAPS Take 100 mg by mouth 2 (two) times daily. 60 capsule 0 Past Week at Unknown time  . famotidine (PEPCID) 20 MG tablet Take 1 tablet (20 mg total) by mouth 2 (two) times daily. 30 tablet 6   . feeding supplement, RESOURCE BREEZE, (RESOURCE BREEZE) LIQD Take 1 Container by mouth 3 (three) times daily between meals.  0 Unknown at Unknown time  . Fluticasone-Salmeterol (ADVAIR) 500-50 MCG/DOSE AEPB Inhale 1 puff into the lungs 2 (two) times daily.   09/06/2014 at Unknown time  . gabapentin (NEURONTIN) 400 MG capsule Take 1 capsule (400 mg total) by mouth 2 (two) times daily. Patient takes 400 mg 3 times daily and 1200 mg at bedtime (Patient taking differently: Take 400 mg by mouth 2 (two) times daily. )   09/07/2014 at 0700  . metoprolol (LOPRESSOR) 50 MG tablet Take 50 mg by mouth 2 (two) times daily.   09/07/2014 at 0700  . pantoprazole (PROTONIX) 40 MG tablet Take 40 mg by mouth daily.   09/07/2014 at 0700  . prazosin (MINIPRESS) 2 MG capsule Take 2 mg by mouth at bedtime.   09/07/2014 at 0700  . venlafaxine XR (EFFEXOR-XR) 150 MG 24 hr capsule Take 300 mg by mouth daily with breakfast.   09/07/2014 at 0700     ROS: General: no fevers/chills/night sweats Eyes: no blurry vision, diplopia, or amaurosis ENT: no sore throat or hearing loss Resp: Has chronic cough, wheezing, no hemoptysis CV: reports chest pain; no edema or palpitations GI: no abdominal pain,  nausea, vomiting, diarrhea, or constipation GU: no dysuria, frequency, or hematuria Skin: no rash Neuro: Reports headache; no numbness, tingling, or weakness of extremities Musculoskeletal: no joint pain or swelling Heme: no bleeding, DVT, or easy bruising Endo: no polydipsia or polyuria    Physical Exam: Blood pressure 175/82, pulse 65, temperature 98.1 F (36.7 C), temperature source Oral, resp. rate 16, height '5\' 1"'  (1.549 m), weight 49.034 kg (108 lb 1.6 oz), SpO2 97 %.   General appearance: alert, cooperative, appears older than stated age and no distress Lungs: Bilateral scattered rhonchi  present Heart: regular rate and rhythm, S1, S2 normal, no murmur, click, rub or gallop Abdomen: soft, non-tender; bowel sounds normal; no masses,  no organomegaly Extremities: extremities normal, atraumatic, no cyanosis or edema Pulses: 2+ and symmetric abdominal bruit heard Neurologic: Grossly normal  Labs: BMP Latest Ref Rng 11/01/2015 10/31/2015 09/08/2014  Glucose 65 - 99 mg/dL 164(H) 109(H) 89  BUN 6 - 20 mg/dL 21(H) 19 27(H)  Creatinine 0.44 - 1.00 mg/dL 2.08(H) 1.93(H) 1.30(H)  Sodium 135 - 145 mmol/L 135 138 138  Potassium 3.5 - 5.1 mmol/L 3.6 3.4(L) 3.8  Chloride 101 - 111 mmol/L 99(L) 99(L) 100  CO2 22 - 32 mmol/L '27 27 23  ' Calcium 8.9 - 10.3 mg/dL 9.5 9.7 9.7    Lab Results  Component Value Date   WBC 7.9 10/31/2015   HGB 11.2* 10/31/2015   HCT 34.8* 10/31/2015   MCV 91.6 10/31/2015   PLT 351 10/31/2015     Lipid Panel     Component Value Date/Time   CHOL 190 10/08/2012 0228   TRIG 152* 10/08/2012 0228   HDL 51 10/08/2012 0228   CHOLHDL 3.7 10/08/2012 0228   VLDL 30 10/08/2012 0228   LDLCALC 109* 10/08/2012 0228    BNP (last 3 results)  Recent Labs  11/01/15 0056  BNP 140.2*     Recent Labs  11/01/15 0056 11/01/15 0558  TROPONINI <0.03 <0.03    Lab Results  Component Value Date   TROPONINI <0.03 11/01/2015    EKG 11/01/2015: Sinus rhythm at a rate  of 64 bpm, normal axis, normal intervals, borderline criteria for LVH, no evidence of ischemia.  Echo 07/02/2014:  - Left ventricle: The cavity size was normal. Wall thickness was normal. Systolic function was normal. The estimated ejection fraction was in the range of 55% to 60%. - Mitral valve: There was moderate regurgitation. - Left atrium: The atrium was moderately dilated. - Pulmonary arteries: PA peak pressure: 59 mm Hg (S). - Pericardium, extracardiac: There was a left pleural effusion.   Radiology: Dg Chest 2 View  11/01/2015  CLINICAL DATA:  Mid chest pain and shortness of breath.  Headache. EXAM: CHEST  2 VIEW COMPARISON:  Most recent chest radiographs 07/03/2014 FINDINGS: The cardiomediastinal contours are normal. Mild hyperinflation and bronchitic change. Pulmonary vasculature is normal. No consolidation, pleural effusion, or pneumothorax. No acute osseous abnormalities are seen. Remote right midshaft clavicle fracture again seen. IMPRESSION: No acute pulmonary process. Electronically Signed   By: Jeb Levering M.D.   On: 11/01/2015 01:07   Ct Head Wo Contrast  11/01/2015  CLINICAL DATA:  Headache for 5 days, hypertension. Light sensitivity with blurry vision. EXAM: CT HEAD WITHOUT CONTRAST TECHNIQUE: Contiguous axial images were obtained from the base of the skull through the vertex without intravenous contrast. COMPARISON:  Head CT 06/07/2012 FINDINGS: No intracranial hemorrhage, mass effect, or midline shift. No hydrocephalus. The basilar cisterns are patent. No evidence of territorial infarct. No intracranial fluid collection. Calvarium is intact. Included paranasal sinuses and mastoid air cells are well aerated. IMPRESSION: No acute intracranial abnormality. Electronically Signed   By: Jeb Levering M.D.   On: 11/01/2015 01:05    Scheduled Meds: . albuterol  2.5 mg Nebulization Q4H  . amLODipine  5 mg Oral Daily  . aspirin EC  325 mg Oral Daily  . clopidogrel  75 mg Oral  Daily  . docusate sodium  100 mg Oral BID  . famotidine  20 mg Oral BID  . gabapentin  400 mg  Oral BID  . ipratropium  0.5 mg Nebulization Q4H  . metoprolol  50 mg Oral BID  . mometasone-formoterol  2 puff Inhalation BID  . pantoprazole  40 mg Oral Daily  . venlafaxine XR  300 mg Oral Q breakfast   Continuous Infusions:  PRN Meds:.albuterol, ALPRAZolam, hydrALAZINE, morphine injection, ondansetron **OR** ondansetron (ZOFRAN) IV  ASSESSMENT AND PLAN:  1. Chest pain (Coronary angiogram 09/07/2014: Normal coronary arteries), cardiac troponin negative for injury. Suspect musculoskeletal etiology and probable hypertension with hypertensive urgency. 2. Renovascular hypertension: 09/07/2014 H/O Right renal artery 6.0 x 15 mm Herculink, left renal artery 6.0 x 18 mm Herculis Balloon expandable stent, 95% bilateral renal artery stenosis to 0% 3. Acute on chronic CKD 4. Tobacco use disorder 5. Hyperlipidemia 6. Chronic cor pulmonale and diastolic heart failure  Recommendation: due to hypertensive urgency and decrease in renal function, suspect renal artery restenosis. She is already scheduled for renal angiography later today for further evaluation. Will begin bicarb gtt for contrast induced nephropathy protection. I have discussed with the patient regarding renal angiography and angioplasty, she understands 1-2% risk of acute renal failure, kidney loss. Procedure not limited to contrast nephropathy. Less than 1% overall risk of death, bleeding, infection also extend into the patient. Patient is willing to proceed. I will try to perform the procedure today if schedule permits.  Adrian Prows, MD 11/01/2015, 6:46 AM [facility]. [place] Pager: [phone] Office: [phone] If no answer Cell [phone]

## 2015-11-01 NOTE — Interval H&P Note (Signed)
History and Physical Interval Note:  11/01/2015 2:11 PM  [name]  has presented today for surgery, with the diagnosis of claudication  The various methods of treatment have been discussed with the patient and family. After consideration of risks, benefits and other options for treatment, the patient has consented to  Procedure(s): Renal Angiography (N/A) and possible angioplasty  as a surgical intervention .  [name] history has been reviewed, patient examined, no change in status, stable for surgery.  I have reviewed the patient's chart and labs.  Questions were answered to the patient's satisfaction.     Adrian Prows

## 2015-11-01 NOTE — H&P (View-Only) (Signed)
CARDIOLOGY CONSULT NOTE  [name] MRN: [id number] DOB/AGE: 10/26/1960 55 y.o.  Admit date: 10/31/2015 Referring Physician  Paulino Door, MD Primary Physician:  Leamon Arnt, MD Reason for Consultation  Chest Pain  HPI: [name]  is a 55 y.o. female caucasian female with COPD, diastolic CHF, tobacco abuse, and difficult to control hypertension with CKD IV which had resolved with improvement in eGFR from 22 to >50 since angioplasty to her renal arteries on 09/07/2014. She was last seen in the office on 05/13/2015 at which time she was doing well, blood pressure was controlled and renal function was stable (Crea 1.25/eGFR 50). She has h/o chronic exertional chest pain suggestive of angina pectoris with normal coronary arteries which is NTG responsive. She is now admitted with hypertensive urgency, chest pain, and headache. No elevation in cardiac enzymes, no acute EKG changes.   For the past 2 days she has not been feeling well, had diffuse chest discomfort and sharp chest pains but states that not enough to take nitroglycerin. She felt that her blood pressure was elevated and presented to the emergency room. Presently she is asymptomatic without any chest pain.   Past Medical History  Diagnosis Date  . COPD (chronic obstructive pulmonary disease) ([place]) 2000  . Carpal tunnel syndrome   . Diverticulitis 2008  . Pancreatitis 10/2011  . Schizophrenia ([place])   . RLS (restless legs syndrome)   . Bipolar 1 disorder ([place])   . Small bowel obstruction ([place]) 2008  . Cervical cancer ([place]) 1985  . History of colon polyps 2009  . Anemia 2008  . Hypertension 2013  . Anxiety   . Asthma   . Depression 2000  . HLD (hyperlipidemia) 2013  . IBS (irritable bowel syndrome) 2008  . Shortness of breath   . Coronary artery disease   . Heart murmur   . GERD (gastroesophageal reflux disease)   . Daily headache   . Arthritis     "all my joints ache" (09/07/2014)  . Chronic kidney  disease (CKD), stage V ([facility])   . Pneumonia 07/2014     Past Surgical History  Procedure Laterality Date  . Colon surgery  2008    6 inches of colon removed due to obstruction  . Right oophorectomy Right 1996  . Total abdominal hysterectomy  1997  . Coronary angioplasty with stent placement  09/07/2014    "2"  . Appendectomy  1996  . Left heart catheterization with coronary angiogram N/A 09/07/2014    Procedure: LEFT HEART CATHETERIZATION WITH CORONARY ANGIOGRAM;  Surgeon: Laverda Page, MD;  Location: [facility] CATH LAB;  Service: Cardiovascular;  Laterality: N/A;  . Abdominal angiogram N/A 09/07/2014    Procedure: ABDOMINAL ANGIOGRAM;  Surgeon: Laverda Page, MD;  Location: [facility] CATH LAB;  Service: Cardiovascular;  Laterality: N/A;     Family History  Problem Relation Age of Onset  . Other Mother     many bowel obstructions  . Kidney cancer Father   . Bone cancer Father   . Diabetes Father   . Diabetes Daughter   . Heart disease Mother   . Heart disease Father      Social History: Social History   Social History  . Marital Status: Divorced    Spouse Name: N/A  . Number of Children: 1  . Years of Education: N/A   Occupational History  . grill cook/cashier    Social History Main Topics  . Smoking status: Current Every Day Smoker -- 0.50  packs/day for 40 years    Types: Cigarettes  . Smokeless tobacco: Never Used  . Alcohol Use: No  . Drug Use: No  . Sexual Activity: Yes    Birth Control/ Protection: Post-menopausal   Other Topics Concern  . Not on file   Social History Narrative     Prescriptions prior to admission  Medication Sig Dispense Refill Last Dose  . acetaminophen (TYLENOL) 500 MG tablet Take 2,000 mg by mouth every 6 (six) hours as needed for headache.   09/06/2014 at Unknown time  . albuterol (PROVENTIL HFA;VENTOLIN HFA) 108 (90 BASE) MCG/ACT inhaler Inhale 2 puffs into the lungs 2 (two) times daily as needed (asthma).    09/07/2014 at Unknown time  .  ALPRAZolam (XANAX) 0.5 MG tablet Take 1 mg by mouth 3 (three) times daily as needed for anxiety.    Past Week at Unknown time  . amLODipine (NORVASC) 10 MG tablet Take 1 tablet (10 mg total) by mouth daily. 30 tablet 0 09/07/2014 at 0800  . aspirin 81 MG tablet Take 1 tablet (81 mg total) by mouth daily. 360 tablet 0   . cholecalciferol (VITAMIN D) 1000 UNITS tablet Take 1,000 Units by mouth daily.   Past Month at Unknown time  . clopidogrel (PLAVIX) 75 MG tablet Take 1 tablet (75 mg total) by mouth daily. 30 tablet 0   . docusate sodium 100 MG CAPS Take 100 mg by mouth 2 (two) times daily. 60 capsule 0 Past Week at Unknown time  . famotidine (PEPCID) 20 MG tablet Take 1 tablet (20 mg total) by mouth 2 (two) times daily. 30 tablet 6   . feeding supplement, RESOURCE BREEZE, (RESOURCE BREEZE) LIQD Take 1 Container by mouth 3 (three) times daily between meals.  0 Unknown at Unknown time  . Fluticasone-Salmeterol (ADVAIR) 500-50 MCG/DOSE AEPB Inhale 1 puff into the lungs 2 (two) times daily.   09/06/2014 at Unknown time  . gabapentin (NEURONTIN) 400 MG capsule Take 1 capsule (400 mg total) by mouth 2 (two) times daily. Patient takes 400 mg 3 times daily and 1200 mg at bedtime (Patient taking differently: Take 400 mg by mouth 2 (two) times daily. )   09/07/2014 at 0700  . metoprolol (LOPRESSOR) 50 MG tablet Take 50 mg by mouth 2 (two) times daily.   09/07/2014 at 0700  . pantoprazole (PROTONIX) 40 MG tablet Take 40 mg by mouth daily.   09/07/2014 at 0700  . prazosin (MINIPRESS) 2 MG capsule Take 2 mg by mouth at bedtime.   09/07/2014 at 0700  . venlafaxine XR (EFFEXOR-XR) 150 MG 24 hr capsule Take 300 mg by mouth daily with breakfast.   09/07/2014 at 0700     ROS: General: no fevers/chills/night sweats Eyes: no blurry vision, diplopia, or amaurosis ENT: no sore throat or hearing loss Resp: Has chronic cough, wheezing, no hemoptysis CV: reports chest pain; no edema or palpitations GI: no abdominal pain,  nausea, vomiting, diarrhea, or constipation GU: no dysuria, frequency, or hematuria Skin: no rash Neuro: Reports headache; no numbness, tingling, or weakness of extremities Musculoskeletal: no joint pain or swelling Heme: no bleeding, DVT, or easy bruising Endo: no polydipsia or polyuria    Physical Exam: Blood pressure 175/82, pulse 65, temperature 98.1 F (36.7 C), temperature source Oral, resp. rate 16, height '5\' 1"'  (1.549 m), weight 49.034 kg (108 lb 1.6 oz), SpO2 97 %.   General appearance: alert, cooperative, appears older than stated age and no distress Lungs: Bilateral scattered rhonchi  present Heart: regular rate and rhythm, S1, S2 normal, no murmur, click, rub or gallop Abdomen: soft, non-tender; bowel sounds normal; no masses,  no organomegaly Extremities: extremities normal, atraumatic, no cyanosis or edema Pulses: 2+ and symmetric abdominal bruit heard Neurologic: Grossly normal  Labs: BMP Latest Ref Rng 11/01/2015 10/31/2015 09/08/2014  Glucose 65 - 99 mg/dL 164(H) 109(H) 89  BUN 6 - 20 mg/dL 21(H) 19 27(H)  Creatinine 0.44 - 1.00 mg/dL 2.08(H) 1.93(H) 1.30(H)  Sodium 135 - 145 mmol/L 135 138 138  Potassium 3.5 - 5.1 mmol/L 3.6 3.4(L) 3.8  Chloride 101 - 111 mmol/L 99(L) 99(L) 100  CO2 22 - 32 mmol/L '27 27 23  ' Calcium 8.9 - 10.3 mg/dL 9.5 9.7 9.7    Lab Results  Component Value Date   WBC 7.9 10/31/2015   HGB 11.2* 10/31/2015   HCT 34.8* 10/31/2015   MCV 91.6 10/31/2015   PLT 351 10/31/2015     Lipid Panel     Component Value Date/Time   CHOL 190 10/08/2012 0228   TRIG 152* 10/08/2012 0228   HDL 51 10/08/2012 0228   CHOLHDL 3.7 10/08/2012 0228   VLDL 30 10/08/2012 0228   LDLCALC 109* 10/08/2012 0228    BNP (last 3 results)  Recent Labs  11/01/15 0056  BNP 140.2*     Recent Labs  11/01/15 0056 11/01/15 0558  TROPONINI <0.03 <0.03    Lab Results  Component Value Date   TROPONINI <0.03 11/01/2015    EKG 11/01/2015: Sinus rhythm at a rate  of 64 bpm, normal axis, normal intervals, borderline criteria for LVH, no evidence of ischemia.  Echo 07/02/2014:  - Left ventricle: The cavity size was normal. Wall thickness was normal. Systolic function was normal. The estimated ejection fraction was in the range of 55% to 60%. - Mitral valve: There was moderate regurgitation. - Left atrium: The atrium was moderately dilated. - Pulmonary arteries: PA peak pressure: 59 mm Hg (S). - Pericardium, extracardiac: There was a left pleural effusion.   Radiology: Dg Chest 2 View  11/01/2015  CLINICAL DATA:  Mid chest pain and shortness of breath.  Headache. EXAM: CHEST  2 VIEW COMPARISON:  Most recent chest radiographs 07/03/2014 FINDINGS: The cardiomediastinal contours are normal. Mild hyperinflation and bronchitic change. Pulmonary vasculature is normal. No consolidation, pleural effusion, or pneumothorax. No acute osseous abnormalities are seen. Remote right midshaft clavicle fracture again seen. IMPRESSION: No acute pulmonary process. Electronically Signed   By: Jeb Levering M.D.   On: 11/01/2015 01:07   Ct Head Wo Contrast  11/01/2015  CLINICAL DATA:  Headache for 5 days, hypertension. Light sensitivity with blurry vision. EXAM: CT HEAD WITHOUT CONTRAST TECHNIQUE: Contiguous axial images were obtained from the base of the skull through the vertex without intravenous contrast. COMPARISON:  Head CT 06/07/2012 FINDINGS: No intracranial hemorrhage, mass effect, or midline shift. No hydrocephalus. The basilar cisterns are patent. No evidence of territorial infarct. No intracranial fluid collection. Calvarium is intact. Included paranasal sinuses and mastoid air cells are well aerated. IMPRESSION: No acute intracranial abnormality. Electronically Signed   By: Jeb Levering M.D.   On: 11/01/2015 01:05    Scheduled Meds: . albuterol  2.5 mg Nebulization Q4H  . amLODipine  5 mg Oral Daily  . aspirin EC  325 mg Oral Daily  . clopidogrel  75 mg Oral  Daily  . docusate sodium  100 mg Oral BID  . famotidine  20 mg Oral BID  . gabapentin  400 mg  Oral BID  . ipratropium  0.5 mg Nebulization Q4H  . metoprolol  50 mg Oral BID  . mometasone-formoterol  2 puff Inhalation BID  . pantoprazole  40 mg Oral Daily  . venlafaxine XR  300 mg Oral Q breakfast   Continuous Infusions:  PRN Meds:.albuterol, ALPRAZolam, hydrALAZINE, morphine injection, ondansetron **OR** ondansetron (ZOFRAN) IV  ASSESSMENT AND PLAN:  1. Chest pain (Coronary angiogram 09/07/2014: Normal coronary arteries), cardiac troponin negative for injury. Suspect musculoskeletal etiology and probable hypertension with hypertensive urgency. 2. Renovascular hypertension: 09/07/2014 H/O Right renal artery 6.0 x 15 mm Herculink, left renal artery 6.0 x 18 mm Herculis Balloon expandable stent, 95% bilateral renal artery stenosis to 0% 3. Acute on chronic CKD 4. Tobacco use disorder 5. Hyperlipidemia 6. Chronic cor pulmonale and diastolic heart failure  Recommendation: due to hypertensive urgency and decrease in renal function, suspect renal artery restenosis. She is already scheduled for renal angiography later today for further evaluation. Will begin bicarb gtt for contrast induced nephropathy protection. I have discussed with the patient regarding renal angiography and angioplasty, she understands 1-2% risk of acute renal failure, kidney loss. Procedure not limited to contrast nephropathy. Less than 1% overall risk of death, bleeding, infection also extend into the patient. Patient is willing to proceed. I will try to perform the procedure today if schedule permits.  Adrian Prows, MD 11/01/2015, 6:46 AM [facility]. [place] Pager: [phone] Office: [phone] If no answer Cell [phone]

## 2015-11-01 NOTE — H&P (Signed)
[facility] History and Physical  Mariana Doorley [id number] DOB: August 29, 1961 DOA: 10/31/2015  Referring physician: Dr. Leonides Schanz. PCP: Leamon Arnt, MD  Specialists: Dr. Einar Gip. Cardiologist.  Chief Complaint: Headache and chest pain.  HPI: [name] is a 55 y.o. female history of hypertension, COPD, renal artery stenosis, chronic disease present to the ER because of headache. Patient has been having headache over the last 5 days. Headache is mostly in the frontal area. Denies any blurred vision has some nausea. Denies any focal deficits. Patient also has been having chest pain off and on last 5 days. In the ER patient was given IV hydralazine following which patient's blood pressure shows improvement. Troponin was negative EKG showing nonspecific changes. Chest x-ray was unremarkable CT head was unremarkable. Patient will be admitted for hypertensive urgency and chest pain. Since patient also is mildly wheezing Solu-Medrol was given in the ER. Patient states her antihypertensives very recently de-escalated 3 months ago.   Review of Systems: As presented in the history of presenting illness, rest negative.  Past Medical History  Diagnosis Date  . COPD (chronic obstructive pulmonary disease) ([place]) 2000  . Carpal tunnel syndrome   . Diverticulitis 2008  . Pancreatitis 10/2011  . Schizophrenia ([place])   . RLS (restless legs syndrome)   . Bipolar 1 disorder ([place])   . Small bowel obstruction ([place]) 2008  . Cervical cancer ([place]) 1985  . History of colon polyps 2009  . Anemia 2008  . Hypertension 2013  . Anxiety   . Asthma   . Depression 2000  . HLD (hyperlipidemia) 2013  . IBS (irritable bowel syndrome) 2008  . Shortness of breath   . Coronary artery disease   . Heart murmur   . GERD (gastroesophageal reflux disease)   . Daily headache   . Arthritis     "all my joints ache" (09/07/2014)  . Chronic kidney disease (CKD), stage V ([place])   . Pneumonia 07/2014   Past Surgical  History  Procedure Laterality Date  . Colon surgery  2008    6 inches of colon removed due to obstruction  . Right oophorectomy Right 1996  . Total abdominal hysterectomy  1997  . Coronary angioplasty with stent placement  09/07/2014    "2"  . Appendectomy  1996  . Left heart catheterization with coronary angiogram N/A 09/07/2014    Procedure: LEFT HEART CATHETERIZATION WITH CORONARY ANGIOGRAM;  Surgeon: Laverda Page, MD;  Location: [facility] CATH LAB;  Service: Cardiovascular;  Laterality: N/A;  . Abdominal angiogram N/A 09/07/2014    Procedure: ABDOMINAL ANGIOGRAM;  Surgeon: Laverda Page, MD;  Location: [facility] CATH LAB;  Service: Cardiovascular;  Laterality: N/A;   Social History:  reports that she has been smoking Cigarettes.  She has a 20 pack-year smoking history. She has never used smokeless tobacco. She reports that she does not drink alcohol or use illicit drugs. Where does patient live home. Can patient participate in ADLs? Yes.  Allergies  Allergen Reactions  . Doxycycline Anaphylaxis and Hives  . Aspirin Other (See Comments)    Internal bleeding  . Hydrocodone-Acetaminophen Nausea And Vomiting  . Iohexol Itching    Pt has itching nose after iv contrast injection    Family History:  Family History  Problem Relation Age of Onset  . Other Mother     many bowel obstructions  . Kidney cancer Father   . Bone cancer Father   . Diabetes Father   . Diabetes Daughter   . Heart disease  Mother   . Heart disease Father       Prior to Admission medications   Medication Sig Start Date End Date Taking? Authorizing Provider  acetaminophen (TYLENOL) 500 MG tablet Take 2,000 mg by mouth every 6 (six) hours as needed for headache.    Historical Provider, MD  albuterol (PROVENTIL HFA;VENTOLIN HFA) 108 (90 BASE) MCG/ACT inhaler Inhale 2 puffs into the lungs 2 (two) times daily as needed (asthma).     Historical Provider, MD  ALPRAZolam Duanne Moron) 0.5 MG tablet Take 1 mg by mouth 3 (three)  times daily as needed for anxiety.     Historical Provider, MD  amLODipine (NORVASC) 10 MG tablet Take 1 tablet (10 mg total) by mouth daily. 07/04/14   Lucious Groves, DO  aspirin 81 MG tablet Take 1 tablet (81 mg total) by mouth daily. 09/08/14   Adrian Prows, MD  cholecalciferol (VITAMIN D) 1000 UNITS tablet Take 1,000 Units by mouth daily.    Historical Provider, MD  clopidogrel (PLAVIX) 75 MG tablet Take 1 tablet (75 mg total) by mouth daily. 09/08/14   Adrian Prows, MD  docusate sodium 100 MG CAPS Take 100 mg by mouth 2 (two) times daily. 07/04/14   Lucious Groves, DO  famotidine (PEPCID) 20 MG tablet Take 1 tablet (20 mg total) by mouth 2 (two) times daily. 09/08/14   Adrian Prows, MD  feeding supplement, RESOURCE BREEZE, (RESOURCE BREEZE) LIQD Take 1 Container by mouth 3 (three) times daily between meals. 07/04/14   Lucious Groves, DO  Fluticasone-Salmeterol (ADVAIR) 500-50 MCG/DOSE AEPB Inhale 1 puff into the lungs 2 (two) times daily.    Historical Provider, MD  gabapentin (NEURONTIN) 400 MG capsule Take 1 capsule (400 mg total) by mouth 2 (two) times daily. Patient takes 400 mg 3 times daily and 1200 mg at bedtime Patient taking differently: Take 400 mg by mouth 2 (two) times daily.  07/04/14   Lucious Groves, DO  metoprolol (LOPRESSOR) 50 MG tablet Take 50 mg by mouth 2 (two) times daily.    Historical Provider, MD  pantoprazole (PROTONIX) 40 MG tablet Take 40 mg by mouth daily.    Historical Provider, MD  prazosin (MINIPRESS) 2 MG capsule Take 2 mg by mouth at bedtime.    Historical Provider, MD  venlafaxine XR (EFFEXOR-XR) 150 MG 24 hr capsule Take 300 mg by mouth daily with breakfast.    Historical Provider, MD    Physical Exam: Filed Vitals:   11/01/15 0322 11/01/15 0333 11/01/15 0354 11/01/15 0404  BP: 189/84 202/96 159/99 170/78  Pulse:  58 65 65  Temp:    98.1 F (36.7 C)  TempSrc:    Oral  Resp:  15  16  Height:    5\' 1"  (1.549 m)  Weight:    49.034 kg (108 lb 1.6 oz)  SpO2:  98%  97%      General:  Moderately built and nourished.  Eyes: Anicteric. No pallor.  ENT: No discharge from the ears eyes nose normal.  Neck: No JVD appreciated. No mass felt.  Cardiovascular: S1 and S2 heard.  Respiratory: No rhonchi or crepitations.  Abdomen: Soft nontender bowel sounds present.  Skin: No rash.  Musculoskeletal: No edema.  Psychiatric: Appears normal.  Neurologic: Alert awake oriented to time place and person. Moves all extremities.  Labs on Admission:  Basic Metabolic Panel:  Recent Labs Lab 10/31/15 2259  NA 138  K 3.4*  CL 99*  CO2 27  GLUCOSE 109*  BUN 19  CREATININE 1.93*  CALCIUM 9.7   Liver Function Tests: No results for input(s): AST, ALT, ALKPHOS, BILITOT, PROT, ALBUMIN in the last 168 hours. No results for input(s): LIPASE, AMYLASE in the last 168 hours. No results for input(s): AMMONIA in the last 168 hours. CBC:  Recent Labs Lab 10/31/15 2259  WBC 7.9  HGB 11.2*  HCT 34.8*  MCV 91.6  PLT 351   Cardiac Enzymes:  Recent Labs Lab 11/01/15 0056  TROPONINI <0.03    BNP (last 3 results)  Recent Labs  11/01/15 0056  BNP 140.2*    ProBNP (last 3 results) No results for input(s): PROBNP in the last 8760 hours.  CBG: No results for input(s): GLUCAP in the last 168 hours.  Radiological Exams on Admission: Dg Chest 2 View  11/01/2015  CLINICAL DATA:  Mid chest pain and shortness of breath.  Headache. EXAM: CHEST  2 VIEW COMPARISON:  Most recent chest radiographs 07/03/2014 FINDINGS: The cardiomediastinal contours are normal. Mild hyperinflation and bronchitic change. Pulmonary vasculature is normal. No consolidation, pleural effusion, or pneumothorax. No acute osseous abnormalities are seen. Remote right midshaft clavicle fracture again seen. IMPRESSION: No acute pulmonary process. Electronically Signed   By: Jeb Levering M.D.   On: 11/01/2015 01:07   Ct Head Wo Contrast  11/01/2015  CLINICAL DATA:  Headache for 5 days,  hypertension. Light sensitivity with blurry vision. EXAM: CT HEAD WITHOUT CONTRAST TECHNIQUE: Contiguous axial images were obtained from the base of the skull through the vertex without intravenous contrast. COMPARISON:  Head CT 06/07/2012 FINDINGS: No intracranial hemorrhage, mass effect, or midline shift. No hydrocephalus. The basilar cisterns are patent. No evidence of territorial infarct. No intracranial fluid collection. Calvarium is intact. Included paranasal sinuses and mastoid air cells are well aerated. IMPRESSION: No acute intracranial abnormality. Electronically Signed   By: Jeb Levering M.D.   On: 11/01/2015 01:05    EKG: Independently reviewed. Normal sinus rhythm with LVH changes.  Assessment/Plan Principal Problem:   Hypertensive urgency Active Problems:   COPD (chronic obstructive pulmonary disease) (HCC)   Chest pain   Headache   Pain in the chest   1. Hypertensive urgency - patient will be continued on when necessary IV hydralazine for systolic blood pressure more than 180. I have started patient on Norvasc 5 mg by mouth daily and will continue patient's metoprolol. Slowly escalate patient's antihypertensives. 2. Chest pain - probably related to #1. Cycle cardiac markers. Aspirin. Check 2-D echo. When necessary nitroglycerin. I have consulted patient's cardiologist Dr. Einar Gip. 3. COPD - was initially wheezing in the ER. Presently breathing is improved. I will continue with nebulizers. 4. Headache probably related to uncontrolled hypertension - has improved after blood pressure was controlled. Closely monitor. Check sedimentation rate. 5. History of chronic kidney disease stage III with renal artery stenosis status post stenting - closely monitor metabolic panel.   DVT Prophylaxis SCDs until blood pressure is controlled.  Code Status: Full code.  Family Communication: Discussed with patient.  Disposition Plan: Admit to inpatient.    Kaiah Hosea N. [facility] Pager [phone].  If 7PM-7AM, please contact night-coverage [email or web address] Password [email or web address] 11/01/2015, 5:04 AM

## 2015-11-01 NOTE — ED Notes (Signed)
Consulted EDP regarding patient's continued hypertension, RN to consult admitting MD.

## 2015-11-01 NOTE — ED Notes (Signed)
Dr. Hal Hope has seen patient, BP has come down, pt is okay to go to telemetry floor. Floor nurse notified.

## 2015-11-01 NOTE — Progress Notes (Signed)
   [facility]                                                                              Patient Demographics  [name], is a 55 y.o. female, DOB - 08/27/1961, [id number]  Admit date - 10/31/2015   Admitting Physician Rise Patience, MD  Outpatient Primary MD for the patient is ANDY,CAMILLE L, MD  LOS - 0   Chief Complaint  Patient presents with  . Headache  . Hypertension      HPI on 11/01/2015 by Dr. Gean Birchwood [name] is a 55 y.o. female history of hypertension, COPD, renal artery stenosis, chronic disease present to the ER because of headache. Patient has been having headache over the last 5 days. Headache is mostly in the frontal area. Denies any blurred vision has some nausea. Denies any focal deficits. Patient also has been having chest pain off and on last 5 days. In the ER patient was given IV hydralazine following which patient's blood pressure shows improvement. Troponin was negative EKG showing nonspecific changes. Chest x-ray was unremarkable CT head was unremarkable. Patient will be admitted for hypertensive urgency and chest pain. Since patient also is mildly wheezing Solu-Medrol was given in the ER. Patient states her antihypertensives very recently de-escalated 3 months ago.   Assessment & Plan   Patient admitted earlier today by Dr. Gean Birchwood.  See H&P for details.  Agree with current assessment and plan.  Accelerated hypertension -Suspected to be secondary to renal artery stenosis -Continue amlodipine and metoprolol, PRN hydralazine -plan for angiography later today -Dr. Einar Gip consulted and appreciated  Chest pain -Troponins cycled, currently negative -Possibly musculoskeletal in etiology versus hypertensive urgency -Cardiology consulted and  appreciated -Continue metoprolol  COPD -Stable currently no wheezing -Continue nebs as needed  Headache -Resolved -CT of the head unremarkable  Acute on Chronic kidney  disease, stage III-IV -Patient did have chronic kidney disease however underwent angioplasty to renal arteries 09/07/2014, GFR had recovered. -Dr. Nadyne Coombes suspects possible restenosis of renal arteries, patient scheduled for angiography today -Given bicarbonate, Solu-Medrol, Benadryl prior to procedure  Tobacco abuse -Discussed smoking cessation   Code Status: Full  Family Communication: Family at bedside  Disposition Plan: Admitted. Pending further recommendations and workup.  Time Spent in minutes   30 minutes  Procedures  None  Consults   Cardiology  DVT Prophylaxis  SCDs  [name] D.O. on 11/01/2015 at 12:27 PM  Between 7am to 7pm - Pager - [phone]  After 7pm go to [email or web address] - password [email or web address]  And look for the night coverage person covering for me after hours  [facility] Office  [phone]

## 2015-11-02 ENCOUNTER — Encounter (HOSPITAL_COMMUNITY): Payer: Self-pay | Admitting: Cardiology

## 2015-11-02 LAB — BASIC METABOLIC PANEL
Anion gap: 13 (ref 5–15)
BUN: 23 mg/dL — AB (ref 6–20)
CO2: 28 mmol/L (ref 22–32)
CREATININE: 1.58 mg/dL — AB (ref 0.44–1.00)
Calcium: 8.8 mg/dL — ABNORMAL LOW (ref 8.9–10.3)
Chloride: 93 mmol/L — ABNORMAL LOW (ref 101–111)
GFR calc Af Amer: 42 mL/min — ABNORMAL LOW (ref >60)
GFR, EST NON AFRICAN AMERICAN: 36 mL/min — AB (ref >60)
Glucose, Bld: 153 mg/dL — ABNORMAL HIGH (ref 65–99)
POTASSIUM: 3.6 mmol/L (ref 3.5–5.1)
SODIUM: 134 mmol/L — AB (ref 135–145)

## 2015-11-02 LAB — CBC
HEMATOCRIT: 29.2 % — AB (ref 36.0–46.0)
Hemoglobin: 9.3 g/dL — ABNORMAL LOW (ref 12.0–15.0)
MCH: 29.2 pg (ref 26.0–34.0)
MCHC: 31.8 g/dL (ref 30.0–36.0)
MCV: 91.8 fL (ref 78.0–100.0)
PLATELETS: 314 10*3/uL (ref 150–400)
RBC: 3.18 MIL/uL — ABNORMAL LOW (ref 3.87–5.11)
RDW: 15.7 % — AB (ref 11.5–15.5)
WBC: 10.5 10*3/uL (ref 4.0–10.5)

## 2015-11-02 MED ORDER — INFLUENZA VAC SPLIT QUAD 0.5 ML IM SUSY: 0.5000 mL | PREFILLED_SYRINGE | INTRAMUSCULAR | Status: DC

## 2015-11-02 MED ORDER — CLOPIDOGREL BISULFATE 75 MG PO TABS: 75.0000 mg | ORAL_TABLET | Freq: Every day | ORAL | Status: DC

## 2015-11-02 MED ORDER — HYDRALAZINE HCL 25 MG PO TABS: 25.0000 mg | ORAL_TABLET | Freq: Three times a day (TID) | ORAL | Status: DC

## 2015-11-02 MED ORDER — AMLODIPINE BESYLATE 10 MG PO TABS: 10.0000 mg | ORAL_TABLET | Freq: Every day | ORAL | Status: DC

## 2015-11-02 MED ORDER — CILOSTAZOL 100 MG PO TABS: 100.0000 mg | ORAL_TABLET | Freq: Two times a day (BID) | ORAL | Status: DC

## 2015-11-02 NOTE — Care Management Note (Signed)
Case Management Note  Patient Details  Name: [name] MRN: [id number] Date of Birth: July 19, 1961  Subjective/Objective:  Pt admitted with hypertensive urgency                 Action/Plan:  Pt is independent from home   Expected Discharge Date:                  Expected Discharge Plan:  Home/Self Care  In-House Referral:     Discharge planning Services  CM Consult  Post Acute Care Choice:    Choice offered to:     DME Arranged:    DME Agency:     HH Arranged:    [place] Agency:     Status of Service:  Completed, signed off  Medicare Important Message Given:    Date Medicare IM Given:    Medicare IM give by:    Date Additional Medicare IM Given:    Additional Medicare Important Message give by:     If discussed at [place] of Stay Meetings, dates discussed:    Additional Comments: Pt will discharge home today . CM assessed pt prior to discharge and no CM needs were determined Maryclare Labrador, RN 11/02/2015, 9:50 AM

## 2015-11-02 NOTE — Discharge Instructions (Signed)
DASH Eating Plan °DASH stands for "Dietary Approaches to Stop Hypertension." The DASH eating plan is a healthy eating plan that has been shown to reduce high blood pressure (hypertension). Additional health benefits may include reducing the risk of type 2 diabetes mellitus, heart disease, and stroke. The DASH eating plan may also help with weight loss. °WHAT DO I NEED TO KNOW ABOUT THE DASH EATING PLAN? °For the DASH eating plan, you will follow these general guidelines: °· Choose foods with a percent daily value for sodium of less than 5% (as listed on the food label). °· Use salt-free seasonings or herbs instead of table salt or sea salt. °· Check with your health care provider or pharmacist before using salt substitutes. °· Eat lower-sodium products, often labeled as "lower sodium" or "no salt added." °· Eat fresh foods. °· Eat more vegetables, fruits, and low-fat dairy products. °· Choose whole grains. Look for the word "whole" as the first word in the ingredient list. °· Choose fish and skinless chicken or turkey more often than red meat. Limit fish, poultry, and meat to 6 oz (170 g) each day. °· Limit sweets, desserts, sugars, and sugary drinks. °· Choose heart-healthy fats. °· Limit cheese to 1 oz (28 g) per day. °· Eat more home-cooked food and less restaurant, buffet, and fast food. °· Limit fried foods. °· Cook foods using methods other than frying. °· Limit canned vegetables. If you do use them, rinse them well to decrease the sodium. °· When eating at a restaurant, ask that your food be prepared with less salt, or no salt if possible. °WHAT FOODS CAN I EAT? °Seek help from a dietitian for individual calorie needs. °Grains °Whole grain or whole wheat bread. Brown rice. Whole grain or whole wheat pasta. Quinoa, bulgur, and whole grain cereals. Low-sodium cereals. Corn or whole wheat flour tortillas. Whole grain cornbread. Whole grain crackers. Low-sodium crackers. °Vegetables °Fresh or frozen vegetables  (raw, steamed, roasted, or grilled). Low-sodium or reduced-sodium tomato and vegetable juices. Low-sodium or reduced-sodium tomato sauce and paste. Low-sodium or reduced-sodium canned vegetables.  °Fruits °All fresh, canned (in natural juice), or frozen fruits. °Meat and Other Protein Products °Ground beef (85% or leaner), grass-fed beef, or beef trimmed of fat. Skinless chicken or turkey. Ground chicken or turkey. Pork trimmed of fat. All fish and seafood. Eggs. Dried beans, peas, or lentils. Unsalted nuts and seeds. Unsalted canned beans. °Dairy °Low-fat dairy products, such as skim or 1% milk, 2% or reduced-fat cheeses, low-fat ricotta or cottage cheese, or plain low-fat yogurt. Low-sodium or reduced-sodium cheeses. °Fats and Oils °Tub margarines without trans fats. Light or reduced-fat mayonnaise and salad dressings (reduced sodium). Avocado. Safflower, olive, or canola oils. Natural peanut or almond butter. °Other °Unsalted popcorn and pretzels. °The items listed above may not be a complete list of recommended foods or beverages. Contact your dietitian for more options. °WHAT FOODS ARE NOT RECOMMENDED? °Grains °White bread. White pasta. White rice. Refined cornbread. Bagels and croissants. Crackers that contain trans fat. °Vegetables °Creamed or fried vegetables. Vegetables in a cheese sauce. Regular canned vegetables. Regular canned tomato sauce and paste. Regular tomato and vegetable juices. °Fruits °Dried fruits. Canned fruit in light or heavy syrup. Fruit juice. °Meat and Other Protein Products °Fatty cuts of meat. Ribs, chicken wings, bacon, sausage, bologna, salami, chitterlings, fatback, hot dogs, bratwurst, and packaged luncheon meats. Salted nuts and seeds. Canned beans with salt. °Dairy °Whole or 2% milk, cream, half-and-half, and cream cheese. Whole-fat or sweetened yogurt. Full-fat   cheeses or blue cheese. Nondairy creamers and whipped toppings. Processed cheese, cheese spreads, or cheese  curds. Condiments Onion and garlic salt, seasoned salt, table salt, and sea salt. Canned and packaged gravies. Worcestershire sauce. Tartar sauce. Barbecue sauce. Teriyaki sauce. Soy sauce, including reduced sodium. Steak sauce. Fish sauce. Oyster sauce. Cocktail sauce. Horseradish. Ketchup and mustard. Meat flavorings and tenderizers. Bouillon cubes. Hot sauce. Tabasco sauce. Marinades. Taco seasonings. Relishes. Fats and Oils Butter, stick margarine, lard, shortening, ghee, and bacon fat. Coconut, palm kernel, or palm oils. Regular salad dressings. Other Pickles and olives. Salted popcorn and pretzels. The items listed above may not be a complete list of foods and beverages to avoid. Contact your dietitian for more information. WHERE CAN I FIND MORE INFORMATION? National Heart, Lung, and Blood Institute: travelstabloid.com   This information is not intended to replace advice given to you by your health care provider. Make sure you discuss any questions you have with your health care provider.   Document Released: 09/06/2011 Document Revised: 10/08/2014 Document Reviewed: 07/22/2013 Elsevier Interactive Patient Education 2016 [employer].  Hypertension Hypertension is another name for high blood pressure. High blood pressure forces your heart to work harder to pump blood. A blood pressure reading has two numbers, which includes a higher number over a lower number (example: 110/72). HOME CARE   Have your blood pressure rechecked by your doctor.  Only take medicine as told by your doctor. Follow the directions carefully. The medicine does not work as well if you skip doses. Skipping doses also puts you at risk for problems.  Do not smoke.  Monitor your blood pressure at home as told by your doctor. GET HELP IF:  You think you are having a reaction to the medicine you are taking.  You have repeat headaches or feel dizzy.  You have puffiness (swelling)  in your ankles.  You have trouble with your vision. GET HELP RIGHT AWAY IF:   You get a very bad headache and are confused.  You feel weak, numb, or faint.  You get chest or belly (abdominal) pain.  You throw up (vomit).  You cannot breathe very well. MAKE SURE YOU:   Understand these instructions.  Will watch your condition.  Will get help right away if you are not doing well or get worse.   This information is not intended to replace advice given to you by your health care provider. Make sure you discuss any questions you have with your health care provider.   Document Released: 03/05/2008 Document Revised: 09/22/2013 Document Reviewed: 07/10/2013 Elsevier Interactive Patient Education [employer].

## 2015-11-02 NOTE — Progress Notes (Signed)
Pt. Discharged to home  Pt. D/C'd via wheelchair with spouse Discharge information reviewed and given All personal belongings given to Pt.  Education discussed and reviewed  IV was d/c intact upon removal Tele d/c

## 2015-11-02 NOTE — Discharge Summary (Signed)
Physician Discharge Summary  [name] MRN: [id number] DOB/AGE: 53-Jul-1962 53 y.o.  Admit date: 10/31/2015 Discharge date: 11/02/2015  Discharge Diagnoses: 1. Chest pain (Coronary angiogram 09/07/2014: Normal coronary arteries), cardiac troponin negative for injury. Suspect musculoskeletal etiology and probable hypertension with hypertensive urgency. 2. Renovascular hypertension (09/07/2014 H/O Right renal artery 6.0 x 15 mm Herculink, left renal artery 6.0 x 18 mm Herculis Balloon expandable stent, 95% bilateral renal artery stenosis to 0%) 3. Acute on chronic CKD, resolving 4. Tobacco use disorder 5. Hyperlipidemia 6. Chronic cor pulmonale and diastolic heart failure  Significant Diagnostic Studies: Renal angiography 10/31/2014: Abdominal aortogram revealed mild atherosclerotic changes of the abdominal aorta. 2 renal arteries one on either sides. They appeared to be patent however not well visualized due to CO2 injection.  Selective right renal arteriogram revealed widely patent right renal artery without pressure gradient. 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 Suggestive of high-grade greater than 90% stenosis.  Hospital Course:  [name] is a 55 y.o. female caucasian female with COPD, diastolic CHF, tobacco abuse, and difficult to control hypertension with CKD IV which had resolved with improvement in eGFR from 22 to >50 since angioplasty to her renal arteries on 09/07/2014. She was last seen in the office on 05/13/2015 at which time she was doing well, blood pressure was controlled and renal function was stable (Crea 1.25/eGFR 50). She has h/o chronic exertional chest pain suggestive of angina pectoris with normal coronary arteries which is NTG responsive. She was admitted on 10/31/2015 with hypertensive urgency, chest pain, and headache. No elevation in  cardiac enzymes, no acute EKG changes.   Due to hypertensive urgency and decrease in renal function, suspected renal artery restenosis. She was scheduled for renal angiography on 11/01/2015.  After confirming stenosis severe at the on the left renal artery, she underwent successful PTA and scoring balloon angioplasty with a 6.0 x 20 mm Angiosculpt balloon, peak 13 atmospheric pressure inflation. Post procedure there was 0 mmHg pressure gradient from greater than 150 mmHg pressure gradient previous to the procedure. A total of 8 mL of contrast was utilized for diagnostic angiography and interventional procedure. About 180-200 mL of CO2 was utilized for angiography.   Recommendations on discharge: Renal function improving with eGFR improved from 30 to 42. Suspect blood pressure will continue to improve but will add Hydralazine 50m tid for now. Discussed smoking cessation at length. Follow up outpatient next week.   Discharge Exam: Blood pressure 150/56, pulse 73, temperature 98.2 F (36.8 C), temperature source Oral, resp. rate 18, height _0  (1.549 m), weight 51.529 kg (113 lb 9.6 oz), SpO2 95 %.    General appearance: alert, cooperative, appears older than stated age and no distress Lungs: Bilateral scattered rhonchi present Heart: regular rate and rhythm, S1, S2 normal, no murmur, click, rub or gallop Abdomen: soft, non-tender; bowel sounds normal; no masses, no organomegaly Extremities: extremities normal, atraumatic, no cyanosis or edema Pulses: 2+ and symmetric abdominal bruit heard, right femoral access site asymptomatic Neurologic: Grossly normal  Labs:   BMP Latest Ref Rng 11/02/2015 11/01/2015 10/31/2015  Glucose 65 - 99 mg/dL 153(H) 164(H) 109(H)  BUN 6 - 20 mg/dL 23(H) 21(H) 19  Creatinine 0.44 - 1.00 mg/dL 1.58(H) 2.08(H) 1.93(H)  Sodium 135 - 145 mmol/L 134(L) 135 138  Potassium 3.5 - 5.1 mmol/L 3.6 3.6 3.4(L)  Chloride 101 - 111 mmol/L 93(L) 99(L) 99(L)  CO2 22 - 32 mmol/L _0 Calcium 8.9 - 10.3 mg/dL 8.8(L) 9.5 9.7   Lab Results  Component Value Date   WBC 10.5 11/02/2015   HGB 9.3* 11/02/2015   HCT 29.2* 11/02/2015   MCV 91.8 11/02/2015   PLT 314 11/02/2015     Lipid Panel     Component Value Date/Time   CHOL 190 10/08/2012 0228   TRIG 152* 10/08/2012 0228   HDL 51 10/08/2012 0228   CHOLHDL 3.7 10/08/2012 0228   VLDL 30 10/08/2012 0228   LDLCALC 109* 10/08/2012 0228    BNP (last 3 results)  Recent Labs  11/01/15 0056  BNP 140.2*    HEMOGLOBIN A1C No results found for: HGBA1C, MPG  Cardiac Panel (last 3 results)  Recent Labs  11/01/15 0056 11/01/15 0558 11/01/15 1910  TROPONINI <0.03 <0.03 <0.03    Lab Results  Component Value Date   TROPONINI <0.03 11/01/2015     TSH No results for input(s): TSH in the last 8760 hours.  EKG 11/01/2015: Sinus rhythm at a rate of 64 bpm, normal axis, normal intervals, borderline criteria for LVH, no evidence of ischemia.   Radiology: Dg Chest 2 View  11/01/2015  CLINICAL DATA:  Mid chest pain and shortness of breath.  Headache. EXAM: CHEST  2 VIEW COMPARISON:  Most recent chest radiographs 07/03/2014 FINDINGS: The cardiomediastinal contours are normal. Mild hyperinflation and bronchitic change. Pulmonary vasculature is normal. No consolidation, pleural effusion, or pneumothorax. No acute osseous abnormalities are seen. Remote right midshaft clavicle fracture again seen. IMPRESSION: No acute pulmonary process. Electronically Signed   By: Jeb Levering M.D.   On: 11/01/2015 01:07   Ct Head Wo Contrast  11/01/2015  CLINICAL DATA:  Headache for 5 days, hypertension. Light sensitivity with blurry vision. EXAM: CT HEAD WITHOUT CONTRAST TECHNIQUE: Contiguous axial images were obtained from the base of the skull through the vertex without intravenous contrast. COMPARISON:  Head CT 06/07/2012 FINDINGS: No intracranial hemorrhage, mass effect, or midline shift. No hydrocephalus. The basilar  cisterns are patent. No evidence of territorial infarct. No intracranial fluid collection. Calvarium is intact. Included paranasal sinuses and mastoid air cells are well aerated. IMPRESSION: No acute intracranial abnormality. Electronically Signed   By: Jeb Levering M.D.   On: 11/01/2015 01:05      FOLLOW UP PLANS AND APPOINTMENTS  Follow-up Information    Follow up with Adrian Prows, MD In 1 week.   Specialty:  Cardiology   Contact information:   [address] [place] [address] [phone]          Medication List    STOP taking these medications        aspirin 81 MG tablet      TAKE these medications        acetaminophen 500 MG tablet  Commonly known as:  TYLENOL  Take 2,000 mg by mouth every 6 (six) hours as needed for headache.     albuterol 108 (90 Base) MCG/ACT inhaler  Commonly known as:  PROVENTIL HFA;VENTOLIN HFA  Inhale 2 puffs into the lungs 2 (two) times daily as needed (asthma).     ALPRAZolam 0.5 MG tablet  Commonly known as:  XANAX  Take 1 mg by mouth 2 (two) times daily as needed for anxiety.     amLODipine 10 MG tablet  Commonly known as:  NORVASC  Take 1 tablet (10 mg total) by mouth daily.     atorvastatin 80 MG  tablet  Commonly known as:  LIPITOR  Take 80 mg by mouth daily.     cholecalciferol 1000 units tablet  Commonly known as:  VITAMIN D  Take 1,000 Units by mouth daily.     cilostazol 100 MG tablet  Commonly known as:  PLETAL  Take 1 tablet (100 mg total) by mouth 2 (two) times daily.     clopidogrel 75 MG tablet  Commonly known as:  PLAVIX  Take 1 tablet (75 mg total) by mouth daily.     famotidine 20 MG tablet  Commonly known as:  PEPCID  Take 1 tablet (20 mg total) by mouth 2 (two) times daily.     gabapentin 400 MG capsule  Commonly known as:  NEURONTIN  Take 1 capsule (400 mg total) by mouth 2 (two) times daily. Patient takes 400 mg 3 times daily and 1200 mg at bedtime     hydrALAZINE 25 MG tablet   Commonly known as:  APRESOLINE  Take 1 tablet (25 mg total) by mouth 3 (three) times daily.     metoprolol 50 MG tablet  Commonly known as:  LOPRESSOR  Take 50 mg by mouth 2 (two) times daily.     nitroGLYCERIN 0.4 MG SL tablet  Commonly known as:  NITROSTAT  Place 0.4 mg under the tongue every 5 (five) minutes as needed for chest pain.     pantoprazole 40 MG tablet  Commonly known as:  PROTONIX  Take 40 mg by mouth daily.     venlafaxine XR 150 MG 24 hr capsule  Commonly known as:  EFFEXOR-XR  Take 300 mg by mouth daily with breakfast.          Rachel Bo, NP-C 11/02/2015, 7:33 AM [facility] Pager: [phone] Office: [phone]

## 2015-11-09 ENCOUNTER — Emergency Department (HOSPITAL_COMMUNITY)
Admission: EM | Admit: 2015-11-09 | Discharge: 2015-11-09 | Disposition: A | Discharge status: Home / Self Care | Payer: Medicare Other | Attending: Emergency Medicine | Admitting: Emergency Medicine

## 2015-11-09 ENCOUNTER — Emergency Department (HOSPITAL_COMMUNITY): Payer: Medicare Other

## 2015-11-09 DIAGNOSIS — R011 Cardiac murmur, unspecified: Secondary | ICD-10-CM | POA: Diagnosis not present

## 2015-11-09 DIAGNOSIS — N185 Chronic kidney disease, stage 5: Secondary | ICD-10-CM | POA: Diagnosis not present

## 2015-11-09 DIAGNOSIS — Z79899 Other long term (current) drug therapy: Secondary | ICD-10-CM | POA: Insufficient documentation

## 2015-11-09 DIAGNOSIS — Z862 Personal history of diseases of the blood and blood-forming organs and certain disorders involving the immune mechanism: Secondary | ICD-10-CM | POA: Insufficient documentation

## 2015-11-09 DIAGNOSIS — F419 Anxiety disorder, unspecified: Secondary | ICD-10-CM | POA: Insufficient documentation

## 2015-11-09 DIAGNOSIS — Z86010 Personal history of colonic polyps: Secondary | ICD-10-CM | POA: Diagnosis not present

## 2015-11-09 DIAGNOSIS — I120 Hypertensive chronic kidney disease with stage 5 chronic kidney disease or end stage renal disease: Secondary | ICD-10-CM | POA: Insufficient documentation

## 2015-11-09 DIAGNOSIS — Z760 Encounter for issue of repeat prescription: Secondary | ICD-10-CM | POA: Diagnosis not present

## 2015-11-09 DIAGNOSIS — F319 Bipolar disorder, unspecified: Secondary | ICD-10-CM | POA: Diagnosis not present

## 2015-11-09 DIAGNOSIS — Z8701 Personal history of pneumonia (recurrent): Secondary | ICD-10-CM | POA: Diagnosis not present

## 2015-11-09 DIAGNOSIS — Z8541 Personal history of malignant neoplasm of cervix uteri: Secondary | ICD-10-CM | POA: Diagnosis not present

## 2015-11-09 DIAGNOSIS — F209 Schizophrenia, unspecified: Secondary | ICD-10-CM | POA: Diagnosis not present

## 2015-11-09 DIAGNOSIS — J449 Chronic obstructive pulmonary disease, unspecified: Secondary | ICD-10-CM | POA: Diagnosis not present

## 2015-11-09 DIAGNOSIS — R079 Chest pain, unspecified: Secondary | ICD-10-CM | POA: Diagnosis present

## 2015-11-09 DIAGNOSIS — Z9889 Other specified postprocedural states: Secondary | ICD-10-CM | POA: Diagnosis not present

## 2015-11-09 DIAGNOSIS — E785 Hyperlipidemia, unspecified: Secondary | ICD-10-CM | POA: Diagnosis not present

## 2015-11-09 DIAGNOSIS — K219 Gastro-esophageal reflux disease without esophagitis: Secondary | ICD-10-CM | POA: Insufficient documentation

## 2015-11-09 DIAGNOSIS — I2510 Atherosclerotic heart disease of native coronary artery without angina pectoris: Secondary | ICD-10-CM | POA: Insufficient documentation

## 2015-11-09 DIAGNOSIS — G2581 Restless legs syndrome: Secondary | ICD-10-CM | POA: Insufficient documentation

## 2015-11-09 DIAGNOSIS — Z7902 Long term (current) use of antithrombotics/antiplatelets: Secondary | ICD-10-CM | POA: Diagnosis not present

## 2015-11-09 DIAGNOSIS — F17210 Nicotine dependence, cigarettes, uncomplicated: Secondary | ICD-10-CM | POA: Diagnosis not present

## 2015-11-09 LAB — I-STAT TROPONIN, ED
TROPONIN I, POC: 0.09 ng/mL — AB (ref 0.00–0.08)
TROPONIN I, POC: 0.08 ng/mL (ref 0.00–0.08)

## 2015-11-09 LAB — CBC
HCT: 33.9 % — ABNORMAL LOW (ref 36.0–46.0)
Hemoglobin: 11.3 g/dL — ABNORMAL LOW (ref 12.0–15.0)
MCH: 30.5 pg (ref 26.0–34.0)
MCHC: 33.3 g/dL (ref 30.0–36.0)
MCV: 91.4 fL (ref 78.0–100.0)
PLATELETS: 523 10*3/uL — AB (ref 150–400)
RBC: 3.71 MIL/uL — AB (ref 3.87–5.11)
RDW: 15.4 % (ref 11.5–15.5)
WBC: 7.6 10*3/uL (ref 4.0–10.5)

## 2015-11-09 LAB — BASIC METABOLIC PANEL
Anion gap: 11 (ref 5–15)
BUN: 12 mg/dL (ref 6–20)
CALCIUM: 9.9 mg/dL (ref 8.9–10.3)
CO2: 19 mmol/L — ABNORMAL LOW (ref 22–32)
CREATININE: 1.11 mg/dL — AB (ref 0.44–1.00)
Chloride: 107 mmol/L (ref 101–111)
GFR, EST NON AFRICAN AMERICAN: 56 mL/min — AB (ref >60)
Glucose, Bld: 111 mg/dL — ABNORMAL HIGH (ref 65–99)
Potassium: 4.8 mmol/L (ref 3.5–5.1)
SODIUM: 137 mmol/L (ref 135–145)

## 2015-11-09 MED ORDER — VENLAFAXINE HCL ER 150 MG PO CP24
150.0000 mg | ORAL_CAPSULE | Freq: Once | ORAL | Status: AC
  Administered 2015-11-09: 150 mg via ORAL
  Filled 2015-11-09: qty 1

## 2015-11-09 MED ORDER — VENLAFAXINE HCL ER 150 MG PO CP24: 150.0000 mg | ORAL_CAPSULE | Freq: Every day | ORAL | Status: DC

## 2015-11-09 NOTE — ED Provider Notes (Signed)
Patient with intermittent chest pains since yesterday anterior lasting anywhere from a minute to 20 minutes. She vomited one time this morning. Pain worse with changing position, improved with many still. Nonexertional. No other associated symptoms. She continues to smoke She also reports that she ran out of her Effexor a few weeks ago. Her daughter reports that she was "talking out of her head" earlier this morning. Presently patient is alert appropriate Glasgow Coma Score 15 lungs clear to auscultation heart regular rate and rhythm no murmurs abdomen nondistended nontender extremities edema.  Orlie Dakin, MD 11/09/15 1745

## 2015-11-09 NOTE — ED Notes (Signed)
Patient undressed, in gown, on monitor, continuous pulse oximetry and blood pressure cuff; visitor at bedside 

## 2015-11-09 NOTE — Discharge Instructions (Signed)
Nonspecific Chest Pain  Follow up with Dr. Jonni Sanger. Make an appointment to get your medications refilled. Return for chest pain, shortness of breath, or diaphoresis. Chest pain can be caused by many different conditions. There is always a chance that your pain could be related to something serious, such as a heart attack or a blood clot in your lungs. Chest pain can also be caused by conditions that are not life-threatening. If you have chest pain, it is very important to follow up with your health care provider. CAUSES  Chest pain can be caused by:  Heartburn.  Pneumonia or bronchitis.  Anxiety or stress.  Inflammation around your heart (pericarditis) or lung (pleuritis or pleurisy).  A blood clot in your lung.  A collapsed lung (pneumothorax). It can develop suddenly on its own (spontaneous pneumothorax) or from trauma to the chest.  Shingles infection (varicella-zoster virus).  Heart attack.  Damage to the bones, muscles, and cartilage that make up your chest wall. This can include:  Bruised bones due to injury.  Strained muscles or cartilage due to frequent or repeated coughing or overwork.  Fracture to one or more ribs.  Sore cartilage due to inflammation (costochondritis). RISK FACTORS  Risk factors for chest pain may include:  Activities that increase your risk for trauma or injury to your chest.  Respiratory infections or conditions that cause frequent coughing.  Medical conditions or overeating that can cause heartburn.  Heart disease or family history of heart disease.  Conditions or health behaviors that increase your risk of developing a blood clot.  Having had chicken pox (varicella zoster). SIGNS AND SYMPTOMS Chest pain can feel like:  Burning or tingling on the surface of your chest or deep in your chest.  Crushing, pressure, aching, or squeezing pain.  Dull or sharp pain that is worse when you move, cough, or take a deep breath.  Pain that is also  felt in your back, neck, shoulder, or arm, or pain that spreads to any of these areas. Your chest pain may come and go, or it may stay constant. DIAGNOSIS Lab tests or other studies may be needed to find the cause of your pain. Your health care provider may have you take a test called an ambulatory ECG (electrocardiogram). An ECG records your heartbeat patterns at the time the test is performed. You may also have other tests, such as:  Transthoracic echocardiogram (TTE). During echocardiography, sound waves are used to create a picture of all of the heart structures and to look at how blood flows through your heart.  Transesophageal echocardiogram (TEE).This is a more advanced imaging test that obtains images from inside your body. It allows your health care provider to see your heart in finer detail.  Cardiac monitoring. This allows your health care provider to monitor your heart rate and rhythm in real time.  Holter monitor. This is a portable device that records your heartbeat and can help to diagnose abnormal heartbeats. It allows your health care provider to track your heart activity for several days, if needed.  Stress tests. These can be done through exercise or by taking medicine that makes your heart beat more quickly.  Blood tests.  Imaging tests. TREATMENT  Your treatment depends on what is causing your chest pain. Treatment may include:  Medicines. These may include:  Acid blockers for heartburn.  Anti-inflammatory medicine.  Pain medicine for inflammatory conditions.  Antibiotic medicine, if an infection is present.  Medicines to dissolve blood clots.  Medicines to  treat coronary artery disease.  Supportive care for conditions that do not require medicines. This may include:  Resting.  Applying heat or cold packs to injured areas.  Limiting activities until pain decreases. HOME CARE INSTRUCTIONS  If you were prescribed an antibiotic medicine, finish it all  even if you start to feel better.  Avoid any activities that bring on chest pain.  Do not use any tobacco products, including cigarettes, chewing tobacco, or electronic cigarettes. If you need help quitting, ask your health care provider.  Do not drink alcohol.  Take medicines only as directed by your health care provider.  Keep all follow-up visits as directed by your health care provider. This is important. This includes any further testing if your chest pain does not go away.  If heartburn is the cause for your chest pain, you may be told to keep your head raised (elevated) while sleeping. This reduces the chance that acid will go from your stomach into your esophagus.  Make lifestyle changes as directed by your health care provider. These may include:  Getting regular exercise. Ask your health care provider to suggest some activities that are safe for you.  Eating a heart-healthy diet. A registered dietitian can help you to learn healthy eating options.  Maintaining a healthy weight.  Managing diabetes, if necessary.  Reducing stress. SEEK MEDICAL CARE IF:  Your chest pain does not go away after treatment.  You have a rash with blisters on your chest.  You have a fever. SEEK IMMEDIATE MEDICAL CARE IF:   Your chest pain is worse.  You have an increasing cough, or you cough up blood.  You have severe abdominal pain.  You have severe weakness.  You faint.  You have chills.  You have sudden, unexplained chest discomfort.  You have sudden, unexplained discomfort in your arms, back, neck, or jaw.  You have shortness of breath at any time.  You suddenly start to sweat, or your skin gets clammy.  You feel nauseous or you vomit.  You suddenly feel light-headed or dizzy.  Your heart begins to beat quickly, or it feels like it is skipping beats. These symptoms may represent a serious problem that is an emergency. Do not wait to see if the symptoms will go away. Get  medical help right away. Call your local emergency services (911 in the [place] Do not drive yourself to the hospital.   This information is not intended to replace advice given to you by your health care provider. Make sure you discuss any questions you have with your health care provider.   Document Released: 06/27/2005 Document Revised: 10/08/2014 Document Reviewed: 04/23/2014 Elsevier Interactive Patient Education [employer].

## 2015-11-09 NOTE — ED Notes (Signed)
Patient's daughter states that the patient hasn't taken effexor in 3 days and asks that the patient receive this medication in the emergency department today.

## 2015-11-09 NOTE — Progress Notes (Signed)
CSW engaged with Patient at her bedside to discuss outpatient mental health resources. Patient reports that she was previously being treated at the [facility] but missed an appointment and was "kicked out". CSW provided Patient with resources to outpatient medication/therapy providers in the [facility] as well as information for [facility] in [place] that has walk-in hours for new patients Monday-Friday from 8AM-3PM. Patient was appreciative of CSW's visit. Patient denies any other concerns at this time. CSW signing off as social work interventions have been completed. Please contact if new need(s) arise.   Holly Bodily, LCSW [facility] ED/ [place] Social Worker [phone]

## 2015-11-09 NOTE — ED Provider Notes (Signed)
CSN: [id number]     Arrival date & time 11/09/15  1032 History   First MD Initiated Contact with Patient 11/09/15 1033     Chief Complaint  Patient presents with  . Chest Pain   (Consider location/radiation/quality/duration/timing/severity/associated sxs/prior Treatment) The history is provided by the patient and a relative. No language interpreter was used.   [name] is a 55 year old female with a history of COPD, pancreatitis, schizophrenia, SBO, anemia, hypertension, hyperlipidemia, and CKD who presents via EMS for chest pain that began last night. She describes it as an intermittent dull pain on the left side of her chest radiating to the middle. Daughter states that she was admitted last week but left early even though they recommended that she stay. He reports that she was unable to get some of her blood pressure medications and that she has not followed up with her PCP appointments and that they told her she would not be getting her medications due to missed appointments. She took a Xanax last night to help calm her nerves.  She is not on any anticoagulation medication. Past Medical History  Diagnosis Date  . COPD (chronic obstructive pulmonary disease) ([place]) 2000  . Carpal tunnel syndrome   . Diverticulitis 2008  . Pancreatitis 10/2011  . Schizophrenia ([place])   . RLS (restless legs syndrome)   . Bipolar 1 disorder ([place])   . Small bowel obstruction ([place]) 2008  . Cervical cancer ([place]) 1985  . History of colon polyps 2009  . Anemia 2008  . Hypertension 2013  . Anxiety   . Asthma   . Depression 2000  . HLD (hyperlipidemia) 2013  . IBS (irritable bowel syndrome) 2008  . Shortness of breath   . Coronary artery disease   . Heart murmur   . GERD (gastroesophageal reflux disease)   . Daily headache   . Arthritis     "all my joints ache" (09/07/2014)  . Chronic kidney disease (CKD), stage V ([place])   . Pneumonia 07/2014   Past Surgical History  Procedure Laterality Date  .  Colon surgery  2008    6 inches of colon removed due to obstruction  . Right oophorectomy Right 1996  . Total abdominal hysterectomy  1997  . Coronary angioplasty with stent placement  09/07/2014    "2"  . Appendectomy  1996  . Left heart catheterization with coronary angiogram N/A 09/07/2014    Procedure: LEFT HEART CATHETERIZATION WITH CORONARY ANGIOGRAM;  Surgeon: Laverda Page, MD;  Location: [facility] CATH LAB;  Service: Cardiovascular;  Laterality: N/A;  . Abdominal angiogram N/A 09/07/2014    Procedure: ABDOMINAL ANGIOGRAM;  Surgeon: Laverda Page, MD;  Location: [facility] CATH LAB;  Service: Cardiovascular;  Laterality: N/A;  . Peripheral vascular catheterization N/A 11/01/2015    Procedure: Renal Angiography;  Surgeon: Adrian Prows, MD;  Location: [facility] CV LAB;  Service: Cardiovascular;  Laterality: N/A;   Family History  Problem Relation Age of Onset  . Other Mother     many bowel obstructions  . Kidney cancer Father   . Bone cancer Father   . Diabetes Father   . Diabetes Daughter   . Heart disease Mother   . Heart disease Father    Social History  Substance Use Topics  . Smoking status: Current Every Day Smoker -- 0.50 packs/day for 40 years    Types: Cigarettes  . Smokeless tobacco: Never Used  . Alcohol Use: No   OB History    No data available  Review of Systems  All other systems reviewed and are negative.     Allergies  Doxycycline; Aspirin; Hydrocodone-acetaminophen; and Iohexol  Home Medications   Prior to Admission medications   Medication Sig Start Date End Date Taking? Authorizing Provider  acetaminophen (TYLENOL) 500 MG tablet Take 2,000 mg by mouth every 6 (six) hours as needed for headache.   Yes Historical Provider, MD  albuterol (PROVENTIL HFA;VENTOLIN HFA) 108 (90 BASE) MCG/ACT inhaler Inhale 2 puffs into the lungs 2 (two) times daily as needed (asthma).    Yes Historical Provider, MD  ALPRAZolam Duanne Moron) 0.5 MG tablet Take 1 mg by mouth 2  (two) times daily as needed for anxiety.    Yes Historical Provider, MD  amLODipine (NORVASC) 10 MG tablet Take 1 tablet (10 mg total) by mouth daily. 11/02/15  Yes Neldon Labella, NP  atorvastatin (LIPITOR) 80 MG tablet Take 80 mg by mouth daily.   Yes Historical Provider, MD  cholecalciferol (VITAMIN D) 1000 UNITS tablet Take 1,000 Units by mouth daily.   Yes Historical Provider, MD  cilostazol (PLETAL) 100 MG tablet Take 1 tablet (100 mg total) by mouth 2 (two) times daily. 11/02/15  Yes Neldon Labella, NP  clopidogrel (PLAVIX) 75 MG tablet Take 1 tablet (75 mg total) by mouth daily. 11/02/15  Yes Neldon Labella, NP  docusate sodium (COLACE) 100 MG capsule Take 100 mg by mouth 2 (two) times daily.   Yes Historical Provider, MD  famotidine (PEPCID) 20 MG tablet Take 1 tablet (20 mg total) by mouth 2 (two) times daily. Patient taking differently: Take 20 mg by mouth daily.  09/08/14  Yes Adrian Prows, MD  gabapentin (NEURONTIN) 400 MG capsule Take 1 capsule (400 mg total) by mouth 2 (two) times daily. Patient takes 400 mg 3 times daily and 1200 mg at bedtime Patient taking differently: Take 800 mg by mouth at bedtime.  07/04/14  Yes Lucious Groves, DO  hydrALAZINE (APRESOLINE) 25 MG tablet Take 1 tablet (25 mg total) by mouth 3 (three) times daily. 11/02/15  Yes Neldon Labella, NP  isosorbide mononitrate (IMDUR) 120 MG 24 hr tablet Take 60 mg by mouth daily.   Yes Historical Provider, MD  metoprolol (LOPRESSOR) 50 MG tablet Take 50 mg by mouth 2 (two) times daily.   Yes Historical Provider, MD  pantoprazole (PROTONIX) 40 MG tablet Take 40 mg by mouth daily.   Yes Historical Provider, MD  nitroGLYCERIN (NITROSTAT) 0.4 MG SL tablet Place 0.4 mg under the tongue every 5 (five) minutes as needed for chest pain.    Historical Provider, MD  venlafaxine XR (EFFEXOR XR) 150 MG 24 hr capsule Take 1 capsule (150 mg total) by mouth daily with breakfast. 11/09/15   Brendy Ficek Patel-Mills, PA-C   BP 132/77 mmHg   Pulse 77  Temp(Src) 97.9 F (36.6 C) (Oral)  Resp 17  SpO2 97% Physical Exam  Constitutional: She is oriented to person, place, and time. She appears well-developed and well-nourished. She appears distressed.  Anxious  HENT:  Head: Normocephalic and atraumatic.  Eyes: Conjunctivae are normal.  Neck: Normal range of motion. Neck supple.  Cardiovascular: Normal rate, regular rhythm and normal heart sounds.   No murmur heard. RRR. No murmur.  Pulmonary/Chest: Effort normal and breath sounds normal. No respiratory distress. She has no wheezes. She has no rales.  No shortness of breath. Regular rate and rhythm.  Abdominal: Soft. There is no tenderness.  Musculoskeletal: Normal range of motion. She exhibits no edema or tenderness.  No lower extremity edema or tenderness.  Neurological: She is alert and oriented to person, place, and time.  Skin: Skin is warm and dry.  Nursing note and vitals reviewed.   ED Course  Procedures (including critical care time) Labs Review Labs Reviewed  BASIC METABOLIC PANEL - Abnormal; Notable for the following:    CO2 19 (*)    Glucose, Bld 111 (*)    Creatinine, Ser 1.11 (*)    GFR calc non Af Amer 56 (*)    All other components within normal limits  CBC - Abnormal; Notable for the following:    RBC 3.71 (*)    Hemoglobin 11.3 (*)    HCT 33.9 (*)    Platelets 523 (*)    All other components within normal limits  I-STAT TROPOININ, ED - Abnormal; Notable for the following:    Troponin i, poc 0.09 (*)    All other components within normal limits  I-STAT TROPOININ, ED    Imaging Review Dg Chest 2 View  11/09/2015  CLINICAL DATA:  Left-sided chest pain for 1 day EXAM: CHEST  2 VIEW COMPARISON:  November 01, 2015 FINDINGS: There is no edema or consolidation. The heart size and pulmonary vascularity are normal. No adenopathy. No acute bone lesions. There is evidence of a prior fracture of the right clavicle with remodeling. There is atherosclerotic  calcification in the abdominal aorta with apparent renal stents bilaterally. IMPRESSION: No edema or consolidation. Electronically Signed   By: Lowella Grip III M.D.   On: 11/09/2015 11:23   I have personally reviewed and evaluated these images and lab results as part of my medical decision-making.   EKG Interpretation   Date/Time:  Wednesday November 09 2015 10:44:17 EST Ventricular Rate:  81 PR Interval:  116 QRS Duration: 86 QT Interval:  395 QTC Calculation: 458 R Axis:   99 Text Interpretation:  Ectopic atrial rhythm Atrial premature complex  Borderline short PR interval Left ventricular hypertrophy Probable lateral  infarct, age indeterminate No significant change since last tracing  Confirmed by JACUBOWITZ  MD, SAM [id number]) on 11/09/2015 12:18:44 PM      MDM   Final diagnoses:  Chest pain, unspecified chest pain type  Medication refill   She presents for chest pain that began last night. Denies shortness of breath. She was seen by Dr. Einar Gip with cardiology 9 days ago. Per his note, patient has history of chronic external chest pain suggestive of angina pectoris with normal coronary arteries. She was admitted for hypertensive urgency and chest pain. She had no troponin elevation or EKG changes during her stay. She had been having chest pain but not enough to take nitroglycerin.Patient had echocardiogram done in October 2015 which showed ejection fraction of 55-60%.  Selective right renal arteriogram revealed widely patent right renal artery without pressure gradient. 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 Suggestive of high-grade greater than 90% stenosis. Recommendations on discharge: Renal function improving with eGFR improved from 30 to 42. Suspect blood pressure will continue to improve but will add Hydralazine 58m tid for now. Discussed smoking cessation at  length. Follow up outpatient next week.  Today, patient reports taking her hydralazine. BP is stable at 129/81. Recheck: Patient seems to be more calm. Daughter at bedside. I spoke to Dr. GEinar Gipregarding the patient's chest pain and labs. Patient's creatinine is now 1.11 which is significantly improved from disposition last week.  He was not worried about troponin of 0.09 and states that this may be related to her renal disease. Repeat troponin is 0.08. Hemoglobin is stable. Chest x-ray shows no edema, infiltrate, or pneumothorax. EKG is unchanged from one week ago. Dr. Einar Gip agreed that patient could go home and follow up with Dr. Jonni Sanger.   She was written prescription for effexor.  She was given psych referral by social work. Return precautions discussed with patient.  Filed Vitals:   11/09/15 1330 11/09/15 1345  BP: 148/70 132/77  Pulse: 78 77  Temp:    Resp:       Ottie Glazier, PA-C 11/09/15 [place], MD 11/09/15 1745

## 2015-11-09 NOTE — ED Notes (Signed)
Per [facility] patient complains of chest pain since yesterday.  Patient describes pain as "starting out sharp, and then as it goes away it gets dull".  Patient states she had a vascular stint placed in her right leg November 03, 2015 and she hasn't felt well since.  Patient also complains of feeling chilled and that she took a xanax last night to go to sleep.

## 2016-04-04 NOTE — H&P (Signed)
OFFICE VISIT NOTES COPIED TO EPIC FOR DOCUMENTATION  History of Present Illness Cheyenne Page MD; 03/12/2016 6:35 AM) Patient words: Last O/V 11/28/2015; 3 Mth F/U - renal duplex results; Pt c/o being weak & tired. She is also having frequent headaches.  The patient is a 55 year old female who presents for a Follow-up for Hypertension. She has a history of COPD, diastolic CHF, tobacco abuse, and difficult to control hypertension with CKD IV which had resolved with improvement in eGFR from 22 to >50 since angioplasty to her renal arteries on 09/07/2014. She has h/o chronic exertional chest pain suggestive of angina pectoris with normal coronary arteries which is NTG responsive.  She was admitted on 10/31/2015 with hypertensive urgency and decrease in renal function, suspected renal artery restenosis. She was scheduled for renal angiography on 11/01/2015. She had restenosis on the left renal artery, she underwent successful scoring balloon angioplasty with a 6.0 x 20 mm Angiosculpt balloon. She was scheduled for repeat renal artery duplex for surveillance and presents today for follow up. Denies any new symptoms or concerns today except for increased frequency of headaches. No visual disturbances. No nausea or vomiting. Still smoking. BP controlled. No chest pain, has not used s.l NTG recently.   Problem List/Past Medical ([name]; 03/12/2016 6:32 AM) Cor pulmonale (chronic) (I27.81)  Non-rheumatic mitral regurgitation (I34.0)  Dyspnea and respiratory abnormalities (R06.00, R06.89)  Angina pectoris (I20.9)  Coronary angiogram 09/07/2014: Normal coronary arteries. Lexiscan Myoview stress test 07/28/2014: 1. Resting EKG demonstrates what appears to be ectopic atrial rhythm. There is nonspecific inferior and lateral 1 mm ST segment depression, cannot exclude ischemia. Stress EKG is nondiagnostic for ischemia, however there was additional 1 mm ST depression specifically in the inferior lead.  Stress symptoms included dizziness. Stress terminated due to and of protocol. 2. The t.i.d. index was 1.16. The left ventricle was dilated moderately, with left ventricular end-diastolic volume of 665 mL. The stress images appeared to show additional LV dilatation. The perfusion imaging study demonstrates uniform uptake of radioisotope without demonstrable ischemia. Dynamic QGS images reveal mild global hypokinesis, LVEF estimated at 36%. This represents a high risk study, clinical correlation is highly recommended. Multivessel disease cannot be completely excluded. 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 Centrilobular emphysema (J43.2)  Renal artery stenosis, native, bilateral (I70.1)  Renal artery duplex 02/29/2016: Hemodynamically significant stenosis of the right renal artery. Normal intrarenal vascular perfusion is noted in both kidneys. Diffuse mixed plaque noted in the abdominal aorta. Clinical correlation is suggested. Compared to 11/24/15, left renal artery stenosis not present and right renal artery stenosis is now and represents in-stent restenosis. Left renal artery stent is patent. Nephrology office visit 02/01/2015: Follow-up CKD stage IV, has improved to stage II since renal artery stenting. Labs and BP stable. No changes. Renal artery duplex 11/24/2015: Hemodynamically significant stenosis of the left renal artery. Normal intrarenal vascular perfusion is noted in both kidneys. Diffuse plaque noted in the abdominal aorta with diffuse calcification. Clinical correlation is suggested. No significant change from 02/03/2015. Right renal artery stent patent. Consider further w/u if clinically indicated. Renal arteriogram/angioplasty 11/01/2015: H/O bilateral renal stenting on 09/07/2014. Right renal artery 6.0 x 15 mm Herculink widely patent, left renal artery 6.0 x 18 mm Herculink in-stent  restenosis s/p 6x20 mm Angiosculpt scoring balloon PTA, 90% to 0%. Bruit (arterial) (R09.89)  carotid, abdominal and bifemoral. CHF (congestive  heart failure), NYHA class II, chronic, diastolic (M01.02)  Echocardiogram 07/02/2014: Normal LV systolic function, ejection fraction 55-60%. Moderately dilated left atrium. Moderate mitral regurgitation, mild tricuspid regurgitation and moderate pulmonary hypertension, PA pressure estimated to be 59 mmHg. Right ventricle was normal. 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 Follow up in one year is appropriate if clinically indicated. Hypovitaminosis D (E55.9)  Schizophrenia (F20.9)  RLS (restless legs syndrome) (G25.81)  Anxiety (F41.9)  Arthritis (M19.90)  IDA (iron deficiency anemia) (D50.9)   Allergies ([name]; March 17, 2016 2:03 PM) Doxycycline *DERMATOLOGICALS*  Anaphylaxis, Rash. Aspirin *ANALGESICS - NonNarcotic*  internal bleeding HYDROcodone Bitartrate *CHEMICALS*  Itching, Vomiting. Iohexol *DIAGNOSTIC PRODUCTS*   Family History ([name]; 17-Mar-2016 2:03 PM) Mother  Deceased. at age 67, from MI ; HEART DISEASE, known seizures, lung disease and colon cancer Father  Deceased. at age 58, from [place], BONE CANCER, DIABETES, HEART DISEASE, prostate cancer Siblings  2 Older Brothers  Social History ([name]; March 17, 2016 2:03 PM) Current tobacco use  Current every day smoker. 1/3 ppd (pt uses Nicotine patches also) Non Drinker/No Alcohol Use  Marital status  Divorced. Number of Children  1. Living Situation  Lives with relatives. 2 grandchildren live with her  Past  Surgical History ([name]; 03/17/16 2:03 PM) PARTIAL HYSTERECTOMY 1996 Hysterectomy; Abdominal 1997 total Colectomy 2009 6 inches of colon removed due to obstruction.  Medication History ([name]; March 17, 2016 2:13 PM) Atorvastatin Calcium (80MG Tablet, 1 (one) Tablet Oral daily, Taken starting 02/17/2016) Active. Clopidogrel Bisulfate (75MG Tablet, 1 Tablet Oral daily, Taken starting 02/17/2016) Active. Chlorthalidone (25MG Tablet, 1 (one) Tablet Oral daily, Taken starting 12/13/2015) Active. Famotidine (20MG Tablet, 1 Tablet Oral daily, Taken starting 12/07/2015) Active. Nitrostat (0.4MG Tab Sublingual, 1 (one) Tab Sub Tab Sublingua Sublingual every 5 minutes as needed for chest pain., Taken starting 10/05/2015) Active. Isosorbide Mononitrate ER (120MG Tablet ER 24HR,  (one half) Tablet ER 24HR Ta Oral daily, Taken starting 04/05/2015) Active. Nicotine (21MG/24HR Patch 24HR, 1 (one) Patch 24HR Patch 24HR Transdermal daily, Taken starting 07/23/2014) Active. ProAir HFA (108 (90 Base)MCG/ACT Aerosol Soln, 2 puffs Inhalation daily as needed) Active. Albuterol-Ipratropium (2.5-0.5MG/3ML Solution, 2 puffs Inhalation 4-5 times daily) Active. Pantoprazole Sodium (40MG Tablet DR, 1 Oral daily) Active. Metoprolol Tartrate (50MG Tablet, 1 Oral two times daily) Active. Gabapentin (400MG Tablet, 1 Oral daily) Active. Venlafaxine HCl ER (150MG Tablet ER 24HR, 2 Oral every morning) Active. Docusate Sodium (100MG Capsule, 1 Oral two times daily) Active. ALPRAZolam (1MG Tablet, 2 Oral daily as needed) Active. Spiriva HandiHaler (18MCG Capsule, 1 puff Inhalation daily) Active. Calcium 600 (1500 (600 Ca)MG Tablet, 1 Oral daily) Active. Medications Reconciled (Verbally)  Diagnostic Studies History ([name]; 03/17/16 2:05 PM) Worthy Keeler  12/09/2015: Creatinine 1.12, eGFR 56, potassium 5.7 11/29/2015: Creatinine 1.07, eGFR 59, potassium 5.8, Hb 9.7/HCT 28.8 with normocytic  indices 11/28/2015: Creatinine 1.16, eGFR 54, potassium 5.5, total cholesterol 154, triglycerides 112, HDL 56, LDL 76, LDL particle #802, vitamin D 32.9 11/09/2015: BUN 12, creatinine 1.11, eGFR 56, potassium 4.8, HDL 11.3/HCT 33.9 with normocytic indices, PLT 523 10/31/2015: BUN 19, creatinine 1.93, eGFR 29, potassium 3.4, HDL 11.2/HCT 34.8 with normocytic indices, PLT 351, BNP 140.2 01/03/2015: Total cholesterol 144, triglycerides 139, HDL 53, LDL 63, creatinine 1.0, CMP normal  10/08/2012: Total cholesterol 190, triglycerides 152, HDL 51, LDL 109. Colonoscopy 09/02/2008 Assessment: Single small, hyperplastic appearing sigmoid polyp. Otherwise normal examination to the cecum. Normal appearing anastomosis at 25cm from anus from previous diverticulitis surgery. Stress Echo 10/08/2012 Stress echo results: This is intrepreted as a nondiagnostic stress echo. The ECG was negative for ischemia. The images were obtained at a submaximal HR (73 % of predeicted maximal HR for the best first image, 56% predicted maximal HR for the last image). There were no segmental wall motion abnormalities to suggest ischemia at maximal exercise effort. Imp: No evidence of ischemia but images were obtained at a submaximal level suggest Lexiscan Myoview if there is significant concern for ischemia. Renal Dopplers 04/30/2014 Right Kidney: Length: 8.8 cm (decreased) Echogenicity within normal limits. No mass or hydronephrosis visualized. Left Kidney: Length: 10.3 cm. Echogenicity within normal limits. No mass or hydronephrosis visualized. Bladder: Within normal. Atherosclerotic disease of the abdominal aorta. Abdominal aorta normal in caliber. Two right renal arteries are demonstrated , one left. Bilateral upper end velocity. Echocardiogram 07/02/2014 - Left ventricle: The cavity size was normal. Wall thickness was normal. Systolic function was normal. The estimated ejection fraction was in the range of 55% to 60%. - Mitral valve: There was  moderate regurgitation. - Left atrium: The atrium was moderately dilated. - Moderate pulmonary hypertension. PA peak pressure: 59 mm Hg (S). - Pericardium, extracardiac: There was a left pleural effusion. Nuclear stress test 07/28/2014 Lexiscan Myoview stress test 07/28/2014: 1. Resting EKG demonstrates what appears to be ectopic atrial rhythm. There is nonspecific inferior and lateral 1 mm ST segment depression, cannot exclude ischemia. Stress EKG is nondiagnostic for ischemia, however there was additional 1 mm ST depression specifically in the inferior lead. Stress symptoms included dizziness. Stress terminated due to and of protocol. 2. The t.i.d. index was 1.16. The left ventricle was dilated moderately, with left ventricular end-diastolic volume of 175 mL. The stress images appeared to show additional LV dilatation. The perfusion imaging study demonstrates uniform uptake of radioisotope without demonstrable ischemia. Dynamic QGS images reveal mild global hypokinesis, LVEF estimated at 36%. This represents a high risk study, clinical correlation is highly recommended. Multivessel disease cannot be completely excluded. Coronary Angiogram 09/07/2014 Normal coronary arteries. Renal Arteriogram/Angioplasty 09/07/2014 Right renal artery 6.0 x 15 mm Herculink, left renal artery 6.0 x 18 mm Herculean Balloon expandable stent, 95% bilateral renal artery stenosis to 0% Renal Doppler 02/03/2015 Normal renal ultrasound demonstating normal kidney size. No hydronephrosis. See separate scan for renal artery stenosis. Renal Arteriogram 11/01/2015 : H/O bilateral renal stenting on 09/07/2014: Right renal artery 6.0 x 15 mm Herculink widely patent, left renal artery 6.0 x 18 mm Herculink in-stent restenosis s/p 6x20 mm Angiosculpt scoring balloon PTA, 90% to 0%. Renal Doppler 02/29/2016 Hemodynamically significant stenosis of the right renal artery. Normal intrarenal vascular perfusion is noted in both kidneys.  Diffuse mixed plaque noted in the abdominal aorta. Clinical correlation is suggested. Compared to 11/24/15, left renal artery stenosis not present and right renal artery stenosis is now and represents in-stent restenosis. Left renal artery stent is patent.    Review of Systems Cheyenne Page MD; 03/08/2016 2:44 PM) General Present- Weight Gain (intentional). Not Present- Fever and Night Sweats. Skin Not Present- Itching and Rash. HEENT Not Present- Headache. Respiratory Present- Chronic Cough, Difficulty Breathing on Exertion (improving since nearly quitting smoking) and Sputum Production (especially in the morning). Not Present- Hemoptysis and Snoring. Cardiovascular Not Present- Chest Pain, Claudications and Fainting. Gastrointestinal Not  Present- Abdominal Pain, Constipation, Diarrhea, Nausea and Vomiting. Female Genitourinary Present- Change in urinary stream and Difficulty Emptying Bladder. Musculoskeletal Present- Back Pain and Joint Pain (diffuse and back pain). Neurological Not Present- Headaches. Hematology Not Present- Blood Clots, Easy Bruising and Nose Bleed.  Vitals ([name]; 03/08/2016 2:16 PM) 03/08/2016 2:05 PM Weight: 114.31 lb Height: 61in Body Surface Area: 1.49 m Body Mass Index: 21.6 kg/m  Pulse: 87 (Regular)  P.OX: 90% (Room air) BP: 122/64 (Sitting, Left Arm, Standard)       Physical Exam Cheyenne Page MD; 03/08/2016 2:44 PM) General Mental Status-Alert. General Appearance-Cooperative, Appears older than stated age, Not in acute distress. Orientation-Oriented X3. Build & Nutrition-Asthenic and Moderately built.  Head and Neck Thyroid Gland Characteristics - no palpable nodules, no palpable enlargement.  Chest and Lung Exam Palpation Tender - No chest wall tenderness. Auscultation Breath sounds - Bronchovesicular - Both Lung Fields. Adventitious sounds - Expiratory wheeze - Both Lung  Fields.  Cardiovascular Inspection Jugular vein - Right - No Distention. Auscultation Heart Sounds - Normal heart sounds. Murmurs & Other Heart Sounds: Murmur - Location - Aortic Area, Mitral Area and Pulmonic Area. Timing - Early systolic. Grade - III/VI.  Abdomen Palpation/Percussion Normal exam - Non Tender and No hepatosplenomegaly. Auscultation Normal exam - Bowel sounds normal. Abdominal bruit - Periumbilical.  Peripheral Vascular Lower Extremity Inspection - Bilateral - Not Ischemic, No Pigmentation, no Varicose veins. Palpation - Femoral pulse - Bilateral - Bruit. Popliteal pulse - Bilateral - Normal. Dorsalis pedis pulse - Left - Normal. Right - 1+. Posterior tibial pulse - Bilateral - 1+. Carotid arteries - Left-Soft Bruit. Carotid arteries - Right-Harsh Bruit. Abdomen-Epigastric bruit present, No prominent abdominal aortic pulsation.  Neurologic Motor-Grossly intact without any focal deficits.  Musculoskeletal Global Assessment Left Lower Extremity - normal range of motion without pain. Right Lower Extremity - normal range of motion without pain.   Results Cheyenne Page MD; 03/13/2016 5:41 PM) Labs  Name Value Range Date METABOLIC PANEL, BASIC (05397)   Collected: 12/09/2015 4:06 PM Glucose, Serum 98 mg/dL 65-99 mg/dL Collected: 12/09/2015 4:06 PM BUN 16 mg/dL 6-24 mg/dL Collected: 12/09/2015 4:06 PM Creatinine, Serum 1.12 mg/dL 0.57-1.00 mg/dL Collected: 12/09/2015 4:06 PM eGFR If NonAfricn Am 56 mL/min/1.73 >59 mL/min/1.73 Collected: 12/09/2015 4:06 PM eGFR If Africn Am 65 mL/min/1.73 >59 mL/min/1.73 Collected: 12/09/2015 4:06 PM BUN/Creatinine Ratio 14 9-23 Collected: 12/09/2015 4:06 PM Sodium, Serum 135 mmol/L 134-144 mmol/L Collected: 12/09/2015 4:06 PM Potassium, Serum 5.7 mmol/L 3.5-5.2 mmol/L Collected: 12/09/2015 4:06 PM Chloride, Serum 103 mmol/L 96-106  mmol/L Collected: 12/09/2015 4:06 PM Carbon Dioxide, Total 25 mmol/L 18-29 mmol/L Collected: 12/09/2015 4:06 PM Calcium, Serum 9.7 mg/dL 8.7-10.2 mg/dL Collected: 12/09/2015 4:06 PM LIPOPROTEIN, BLD, BY NMR (67341)   Collected: 05/13/2015 3:23 PM LDL-P 1230 nmol/L <1000 nmol/L Collected: 05/13/2015 3:23 PM LDL-C 97 mg/dL 0-99 mg/dL Collected: 05/13/2015 3:23 PM HDL-C 44 mg/dL >39 mg/dL Collected: 05/13/2015 3:23 PM Triglycerides 112 mg/dL 0-149 mg/dL Collected: 05/13/2015 3:23 PM Cholesterol, Total 163 mg/dL 100-199 mg/dL Collected: 05/13/2015 3:23 PM HDL-P (Total) 29.7 umol/L >=30.5 umol/L Collected: 05/13/2015 3:23 PM Small LDL-P 615 nmol/L <=527 nmol/L Collected: 05/13/2015 3:23 PM LDL Size 21.0 nm >20.5 nm Collected: 05/13/2015 3:23 PM LP-IR Score 47 <=45 Collected: 05/13/2015 3:23 PM    Assessment & Plan Cheyenne Page MD; 03/12/2016 6:39 AM) Hypertension, 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 No change from EKG on  05/13/2015. Future Plans 8/63/8177: METABOLIC PANEL, COMPREHENSIVE (11657) - one time 03/30/2016: CBC & PLATELETS (AUTO) (90383) - one time Renal artery stenosis, native, bilateral (I70.1) Story: Renal artery duplex 02/29/2016: Hemodynamically significant stenosis of the right renal artery. Normal intrarenal vascular perfusion is noted in both kidneys. Diffuse mixed plaque noted in the abdominal aorta. Clinical correlation is suggested. Compared to 11/24/15, left renal artery stenosis not present and right renal artery stenosis is now and represents in-stent restenosis. Left renal artery stent is patent.  Nephrology office visit 02/01/2015: Follow-up CKD stage IV, has improved to stage II since renal artery stenting. Labs and BP stable. No changes.  Renal arteriogram/angioplasty  11/01/2015: H/O bilateral renal stenting on 09/07/2014: Right renal artery 6.0 x 15 mm Herculink widely patent, left renal artery 6.0 x 18 mm Herculink in-stent restenosis s/p 6x20 mm Angiosculpt scoring balloon PTA, 90% to 0%. Future Plans 03/30/2016: PT (PROTHROMBIN TIME) (33832) - one time Asymptomatic bilateral carotid artery stenosis (N19.16) Story: Carotid artery duplex 01/19/2015: Mild stenosis of <50% in bilateral carotid bulb with heteregenous plaque. Follow up in one year is appropriate if clinically indicated. Hyperlipidemia, group A (E78.00) Future Plans 03/30/2016: LIPID PANEL (60600) - one time Hypovitaminosis D (E55.9) Labwork Story: 12/09/2015: Creatinine 1.12, eGFR 56, potassium 5.7  11/29/2015: Creatinine 1.07, eGFR 59, potassium 5.8, Hb 9.7/HCT 28.8 with normocytic indices. Total cholesterol 154, triglycerides 112, HDL 56, LDL 76, LDL particle #802, vitamin D 32.9  11/09/2015: BUN 12, creatinine 1.11, eGFR 56, potassium 4.8, HDL 11.3/HCT 33.9 with normocytic indices, PLT 523  10/31/2015: BUN 19, creatinine 1.93, eGFR 29, potassium 3.4, HDL 11.2/HCT 34.8 with normocytic indices, PLT 351, BNP 140.2  01/03/2015: Total cholesterol 144, triglycerides 139, HDL 53, LDL 63, creatinine 1.0, CMP normal. 10/08/2012: Total cholesterol 190, triglycerides 152, HDL 51, LDL 109. Tobacco use disorder (F17.200) Story: Now down from 2 packs to 1/3 pack cigarettes a day. Current Plans Mechanism of underlying disease process and action of medications discussed with the patient. I discussed primary/secondary prevention and also dietary counseling was done. Patient has started to smoke again, and I had a 50 minutes of discussions regarding smoking cessation and the effects of smoking on cardiac vascular fitness. Patient appears to be motivated to quit smoking again.  She has recurrent restenosis of right femoral artery stent, I would like to proceed with repeat renal angiography for renal consultation.  Discussed the risks associated with peripheral angiography, patient understands and is willing to proceed. Lipids are controlled.  I'll repeat lipid profile along with CMP for pre-renal angiography workup. I will see her back after angiography. Medication reconciliation needs to be performed and patient will fax a list. She has symptoms suggestive of urinary tract infection with frequency and lower abdominal discomfort, advised her to contact her PCP.  CC: Dr. Billey Chang.    Signed by Cheyenne Page, MD (03/13/2016 5:42 PM)

## 2016-04-09 MED ORDER — SODIUM CHLORIDE 0.9% FLUSH: 3.0000 mL | Freq: Two times a day (BID) | INTRAVENOUS | Status: DC

## 2016-04-09 MED ORDER — SODIUM CHLORIDE 0.9 % IV SOLN
INTRAVENOUS | Status: DC
  Administered 2016-04-10: 08:00:00 via INTRAVENOUS

## 2016-04-09 MED ORDER — SODIUM CHLORIDE 0.9 % IV SOLN: 250.0000 mL | INTRAVENOUS | Status: DC | PRN

## 2016-04-09 MED ORDER — SODIUM CHLORIDE 0.9% FLUSH: 3.0000 mL | INTRAVENOUS | Status: DC | PRN

## 2016-04-10 ENCOUNTER — Encounter (HOSPITAL_COMMUNITY)
Admission: RE | Disposition: A | Discharge status: Home / Self Care | Payer: Self-pay | Source: Ambulatory Visit | Attending: Cardiology

## 2016-04-10 ENCOUNTER — Ambulatory Visit (HOSPITAL_COMMUNITY)
Admission: RE | Admit: 2016-04-10 | Discharge: 2016-04-10 | Disposition: A | Discharge status: Home / Self Care | Payer: Medicare Other | Source: Ambulatory Visit | Attending: Cardiology | Admitting: Cardiology

## 2016-04-10 DIAGNOSIS — I209 Angina pectoris, unspecified: Secondary | ICD-10-CM | POA: Diagnosis not present

## 2016-04-10 DIAGNOSIS — T82856A Stenosis of peripheral vascular stent, initial encounter: Secondary | ICD-10-CM | POA: Insufficient documentation

## 2016-04-10 DIAGNOSIS — N185 Chronic kidney disease, stage 5: Secondary | ICD-10-CM | POA: Insufficient documentation

## 2016-04-10 DIAGNOSIS — I6523 Occlusion and stenosis of bilateral carotid arteries: Secondary | ICD-10-CM | POA: Insufficient documentation

## 2016-04-10 DIAGNOSIS — I081 Rheumatic disorders of both mitral and tricuspid valves: Secondary | ICD-10-CM | POA: Diagnosis not present

## 2016-04-10 DIAGNOSIS — I5032 Chronic diastolic (congestive) heart failure: Secondary | ICD-10-CM | POA: Insufficient documentation

## 2016-04-10 DIAGNOSIS — Y812 Prosthetic and other implants, materials and accessory general- and plastic-surgery devices associated with adverse incidents: Secondary | ICD-10-CM | POA: Diagnosis not present

## 2016-04-10 DIAGNOSIS — F17210 Nicotine dependence, cigarettes, uncomplicated: Secondary | ICD-10-CM | POA: Diagnosis not present

## 2016-04-10 DIAGNOSIS — F319 Bipolar disorder, unspecified: Secondary | ICD-10-CM | POA: Insufficient documentation

## 2016-04-10 DIAGNOSIS — J449 Chronic obstructive pulmonary disease, unspecified: Secondary | ICD-10-CM | POA: Diagnosis not present

## 2016-04-10 DIAGNOSIS — I150 Renovascular hypertension: Secondary | ICD-10-CM | POA: Diagnosis not present

## 2016-04-10 DIAGNOSIS — Z8249 Family history of ischemic heart disease and other diseases of the circulatory system: Secondary | ICD-10-CM | POA: Insufficient documentation

## 2016-04-10 DIAGNOSIS — Z808 Family history of malignant neoplasm of other organs or systems: Secondary | ICD-10-CM | POA: Insufficient documentation

## 2016-04-10 DIAGNOSIS — F209 Schizophrenia, unspecified: Secondary | ICD-10-CM | POA: Diagnosis not present

## 2016-04-10 DIAGNOSIS — E559 Vitamin D deficiency, unspecified: Secondary | ICD-10-CM | POA: Insufficient documentation

## 2016-04-10 DIAGNOSIS — E785 Hyperlipidemia, unspecified: Secondary | ICD-10-CM | POA: Diagnosis not present

## 2016-04-10 DIAGNOSIS — I701 Atherosclerosis of renal artery: Secondary | ICD-10-CM | POA: Diagnosis present

## 2016-04-10 DIAGNOSIS — I272 Other secondary pulmonary hypertension: Secondary | ICD-10-CM | POA: Diagnosis not present

## 2016-04-10 DIAGNOSIS — I2781 Cor pulmonale (chronic): Secondary | ICD-10-CM | POA: Diagnosis not present

## 2016-04-10 DIAGNOSIS — Z8042 Family history of malignant neoplasm of prostate: Secondary | ICD-10-CM | POA: Diagnosis not present

## 2016-04-10 DIAGNOSIS — G2581 Restless legs syndrome: Secondary | ICD-10-CM | POA: Insufficient documentation

## 2016-04-10 DIAGNOSIS — I700 Atherosclerosis of aorta: Secondary | ICD-10-CM | POA: Diagnosis not present

## 2016-04-10 DIAGNOSIS — F419 Anxiety disorder, unspecified: Secondary | ICD-10-CM | POA: Diagnosis not present

## 2016-04-10 DIAGNOSIS — D509 Iron deficiency anemia, unspecified: Secondary | ICD-10-CM | POA: Insufficient documentation

## 2016-04-10 DIAGNOSIS — M1990 Unspecified osteoarthritis, unspecified site: Secondary | ICD-10-CM | POA: Insufficient documentation

## 2016-04-10 DIAGNOSIS — Z800 Family history of malignant neoplasm of digestive organs: Secondary | ICD-10-CM | POA: Insufficient documentation

## 2016-04-10 DIAGNOSIS — I132 Hypertensive heart and chronic kidney disease with heart failure and with stage 5 chronic kidney disease, or end stage renal disease: Secondary | ICD-10-CM | POA: Diagnosis not present

## 2016-04-10 DIAGNOSIS — K219 Gastro-esophageal reflux disease without esophagitis: Secondary | ICD-10-CM | POA: Diagnosis not present

## 2016-04-10 DIAGNOSIS — Z801 Family history of malignant neoplasm of trachea, bronchus and lung: Secondary | ICD-10-CM | POA: Insufficient documentation

## 2016-04-10 HISTORY — PX: PERIPHERAL VASCULAR CATHETERIZATION: SHX172C

## 2016-04-10 LAB — POCT ACTIVATED CLOTTING TIME
ACTIVATED CLOTTING TIME: 224 s
ACTIVATED CLOTTING TIME: 241 s
Activated Clotting Time: 197 seconds
Activated Clotting Time: 180 seconds

## 2016-04-10 SURGERY — RENAL ANGIOGRAPHY

## 2016-04-10 MED ORDER — HEPARIN (PORCINE) IN NACL 2-0.9 UNIT/ML-% IJ SOLN
INTRAMUSCULAR | Status: DC | PRN
  Administered 2016-04-10: 1000 mL

## 2016-04-10 MED ORDER — HEPARIN SODIUM (PORCINE) 1000 UNIT/ML IJ SOLN
INTRAMUSCULAR | Status: AC
  Filled 2016-04-10: qty 1

## 2016-04-10 MED ORDER — MIDAZOLAM HCL 2 MG/2ML IJ SOLN
INTRAMUSCULAR | Status: AC
  Filled 2016-04-10: qty 2

## 2016-04-10 MED ORDER — DIPHENHYDRAMINE HCL 25 MG PO CAPS
50.0000 mg | ORAL_CAPSULE | Freq: Once | ORAL | Status: AC
  Administered 2016-04-10: 50 mg via ORAL

## 2016-04-10 MED ORDER — FAMOTIDINE IN NACL 20-0.9 MG/50ML-% IV SOLN
20.0000 mg | Freq: Once | INTRAVENOUS | Status: AC
  Administered 2016-04-10: 20 mg via INTRAVENOUS

## 2016-04-10 MED ORDER — MIDAZOLAM HCL 2 MG/2ML IJ SOLN
INTRAMUSCULAR | Status: DC | PRN
  Administered 2016-04-10 (×2): 1 mg via INTRAVENOUS

## 2016-04-10 MED ORDER — IOPAMIDOL (ISOVUE-370) INJECTION 76%
INTRAVENOUS | Status: DC | PRN
  Administered 2016-04-10: 25 mL via INTRA_ARTERIAL

## 2016-04-10 MED ORDER — FENTANYL CITRATE (PF) 100 MCG/2ML IJ SOLN
INTRAMUSCULAR | Status: AC
  Filled 2016-04-10: qty 2

## 2016-04-10 MED ORDER — FENTANYL CITRATE (PF) 100 MCG/2ML IJ SOLN
INTRAMUSCULAR | Status: DC | PRN
  Administered 2016-04-10: 25 ug via INTRAVENOUS

## 2016-04-10 MED ORDER — DIPHENHYDRAMINE HCL 50 MG/ML IJ SOLN
INTRAMUSCULAR | Status: AC
  Filled 2016-04-10: qty 1

## 2016-04-10 MED ORDER — LIDOCAINE HCL (PF) 1 % IJ SOLN
INTRAMUSCULAR | Status: AC
  Filled 2016-04-10: qty 30

## 2016-04-10 MED ORDER — DIPHENHYDRAMINE HCL 25 MG PO CAPS
ORAL_CAPSULE | ORAL | Status: AC
  Filled 2016-04-10: qty 2

## 2016-04-10 MED ORDER — HEPARIN SODIUM (PORCINE) 1000 UNIT/ML IJ SOLN
INTRAMUSCULAR | Status: DC | PRN
  Administered 2016-04-10: 1000 [IU] via INTRAVENOUS
  Administered 2016-04-10: 5000 [IU] via INTRAVENOUS

## 2016-04-10 MED ORDER — METHYLPREDNISOLONE SODIUM SUCC 125 MG IJ SOLR
INTRAMUSCULAR | Status: AC
  Filled 2016-04-10: qty 2

## 2016-04-10 MED ORDER — METHYLPREDNISOLONE SODIUM SUCC 125 MG IJ SOLR
80.0000 mg | INTRAMUSCULAR | Status: AC
  Administered 2016-04-10: 80 mg via INTRAVENOUS

## 2016-04-10 MED ORDER — LIDOCAINE HCL (PF) 1 % IJ SOLN
INTRAMUSCULAR | Status: DC | PRN
  Administered 2016-04-10: 16 mL via INTRADERMAL

## 2016-04-10 MED ORDER — FAMOTIDINE IN NACL 20-0.9 MG/50ML-% IV SOLN
INTRAVENOUS | Status: AC
  Filled 2016-04-10: qty 50

## 2016-04-10 MED ORDER — SODIUM CHLORIDE 0.9 % IV SOLN: 1.0000 mL/kg/h | INTRAVENOUS | Status: DC

## 2016-04-10 MED ORDER — FENTANYL CITRATE (PF) 100 MCG/2ML IJ SOLN
50.0000 ug | INTRAMUSCULAR | Status: DC | PRN
  Administered 2016-04-10 (×2): 50 ug via INTRAVENOUS

## 2016-04-10 SURGICAL SUPPLY — 16 items
BALLN ANGIOSCULPT OTW 6X20 (BALLOONS) ×4
BALLOON ANGIOSCULPT OTW 6X20 (BALLOONS) IMPLANT
CATH ANGIO 5F PIGTAIL 65CM (CATHETERS) ×2 IMPLANT
CATH CROSS OVER TEMPO 5F (CATHETERS) ×2 IMPLANT
GUIDE CATH VISTA JR4 6F (CATHETERS) ×2 IMPLANT
KIT ENCORE 26 ADVANTAGE (KITS) ×2 IMPLANT
KIT MICROINTRODUCER STIFF 5F (SHEATH) ×2 IMPLANT
KIT PV (KITS) ×4 IMPLANT
SHEATH PINNACLE 6F 10CM (SHEATH) ×2 IMPLANT
STOPCOCK MORSE 400PSI 3WAY (MISCELLANEOUS) ×2 IMPLANT
SYR MEDRAD MARK V 150ML (SYRINGE) ×4 IMPLANT
TRANSDUCER W/STOPCOCK (MISCELLANEOUS) ×4 IMPLANT
TRAY PV CATH (CUSTOM PROCEDURE TRAY) ×4 IMPLANT
TUBING CIL FLEX 10 FLL-RA (TUBING) ×2 IMPLANT
WIRE HITORQ VERSACORE ST 145CM (WIRE) ×2 IMPLANT
WIRE SPARTACORE .014X300CM (WIRE) ×2 IMPLANT

## 2016-04-10 NOTE — Progress Notes (Signed)
Site area: RFA  Site Prior to Removal:  Level 0 Pressure Applied For: Manual: yes   Patient Status During Pull: stable  Post Pull Site:  Level 0 Post Pull Instructions Given:yes   Post Pull Pulses Present:  Dressing Applied:Tegaderm   Bedrest begins @  Comments:

## 2016-04-10 NOTE — Interval H&P Note (Signed)
History and Physical Interval Note:  04/10/2016 9:13 AM  [name]  has presented today for surgery, with the diagnosis of renal artery stenosis  The various methods of treatment have been discussed with the patient and family. After consideration of risks, benefits and other options for treatment, the patient has consented to  Procedure(s): Renal Angiography (N/A) and possible angioplasty as a surgical intervention .  [name] history has been reviewed, patient examined, no change in status, stable for surgery.  I have reviewed the patient's chart and labs.  Questions were answered to the patient's satisfaction.     Adrian Prows

## 2016-04-10 NOTE — Progress Notes (Signed)
Rt sheath pulled from rt groin and held for 20 min by Satira Sark. Vitals stable. Level 0. Dressed with gauzen and tape, Pt given instructions.

## 2016-04-10 NOTE — Discharge Instructions (Signed)
Angiogram, Care After °Refer to this sheet in the next few weeks. These instructions provide you with information about caring for yourself after your procedure. Your health care provider may also give you more specific instructions. Your treatment has been planned according to current medical practices, but problems sometimes occur. Call your health care provider if you have any problems or questions after your procedure. °WHAT TO EXPECT AFTER THE PROCEDURE °After your procedure, it is typical to have the following: °· Bruising at the catheter insertion site that usually fades within 1-2 weeks. °· Blood collecting in the tissue (hematoma) that may be painful to the touch. It should usually decrease in size and tenderness within 1-2 weeks. °HOME CARE INSTRUCTIONS °· Take medicines only as directed by your health care provider. °· You may shower 24-48 hours after the procedure or as directed by your health care provider. Remove the bandage (dressing) and gently wash the site with plain soap and water. Pat the area dry with a clean towel. Do not rub the site, because this may cause bleeding. °· Do not take baths, swim, or use a hot tub until your health care provider approves. °· Check your insertion site every day for redness, swelling, or drainage. °· Do not apply powder or lotion to the site. °· Do not lift over 10 lb (4.5 kg) for 5 days after your procedure or as directed by your health care provider. °· Ask your health care provider when it is okay to: °¨ Return to work or school. °¨ Resume usual physical activities or sports. °¨ Resume sexual activity. °· Do not drive home if you are discharged the same day as the procedure. Have someone else drive you. °· You may drive 24 hours after the procedure unless otherwise instructed by your health care provider. °· Do not operate machinery or power tools for 24 hours after the procedure or as directed by your health care provider. °· If your procedure was done as an  outpatient procedure, which means that you went home the same day as your procedure, a responsible adult should be with you for the first 24 hours after you arrive home. °· Keep all follow-up visits as directed by your health care provider. This is important. °SEEK MEDICAL CARE IF: °· You have a fever. °· You have chills. °· You have increased bleeding from the catheter insertion site. Hold pressure on the site. °SEEK IMMEDIATE MEDICAL CARE IF: °· You have unusual pain at the catheter insertion site. °· You have redness, warmth, or swelling at the catheter insertion site. °· You have drainage (other than a small amount of blood on the dressing) from the catheter insertion site. °· The catheter insertion site is bleeding, and the bleeding does not stop after 30 minutes of holding steady pressure on the site. °· The area near or just beyond the catheter insertion site becomes pale, cool, tingly, or numb. °  °This information is not intended to replace advice given to you by your health care provider. Make sure you discuss any questions you have with your health care provider. °  °Document Released: 04/05/2005 Document Revised: 10/08/2014 Document Reviewed: 02/18/2013 °Elsevier Interactive Patient Education ©2016 [employer] ° °

## 2016-04-11 ENCOUNTER — Encounter (HOSPITAL_COMMUNITY): Payer: Self-pay | Admitting: Cardiology

## 2016-06-22 DIAGNOSIS — R6889 Other general symptoms and signs: Secondary | ICD-10-CM | POA: Insufficient documentation

## 2016-10-02 ENCOUNTER — Ambulatory Visit (INDEPENDENT_AMBULATORY_CARE_PROVIDER_SITE_OTHER): Payer: Medicare Other | Admitting: Gastroenterology

## 2016-10-02 ENCOUNTER — Telehealth: Payer: Self-pay | Admitting: *Deleted

## 2016-10-02 ENCOUNTER — Encounter: Payer: Self-pay | Admitting: Gastroenterology

## 2016-10-02 VITALS — BP 118/64 | HR 70 | Ht 61.75 in | Wt 118.0 lb

## 2016-10-02 DIAGNOSIS — R195 Other fecal abnormalities: Secondary | ICD-10-CM | POA: Diagnosis not present

## 2016-10-02 DIAGNOSIS — K5900 Constipation, unspecified: Secondary | ICD-10-CM

## 2016-10-02 MED ORDER — NA SULFATE-K SULFATE-MG SULF 17.5-3.13-1.6 GM/177ML PO SOLN: 1.0000 | Freq: Once | ORAL | 0 refills | Status: AC

## 2016-10-02 NOTE — Telephone Encounter (Signed)
10/02/2016   RE: [name] DOB: Mar 08, 1961 MRN: [id number]   Dear Dr Einar Gip,   We have scheduled the above patient for an endoscopic procedure. Our records show that she is on anticoagulation therapy.   Please advise as to how long the patient may come off her therapy of Pletal prior to the procedure, which is scheduled for 10/25/2016.  Please fax back/ or route the completed form to [place] at [phone].   Sincerely,    Genella Mech ,CMA

## 2016-10-02 NOTE — Progress Notes (Signed)
HPI: This is a  A pleasant 56 yo woman   who was referred to me by Leamon Arnt, MD  to evaluate  + cologuard screening test by her PCP   Chief complaint is *+ cologuard, also chronic (decade) abd pains.  Below is cut/pasted from 2013 office note from her previous gastroenterologist, Dr. Sharlett Iles at her most recent visit with [place]: "This is a very complex and involved 56 year old Caucasian female schizophrenic, heavy smoker with COPD who has chronic left mid abdominal pain for greater than 8 years.  She had perforated diverticulitis in 2008 that required partial colectomy by Dr. Hulen Skains.  She's had abdominal pain ever since that illness..  She underwent endoscopy and colonoscopy on several occasions, last performed in 2009,both which were unremarkable. She continues with steady abdominal pain unrelated to bowel movements, eating, or any other alleviating or precipitating factors.  She is in and out of the emergency room with pain, and had a CT scan in January of 2012 that was reviewed and was unremarkable.  She was told at that time that she had "pancreatitis".  Review of her record shows no previous gallbladder ultrasound exam."   She had colonoscopy by me, referred by Dr. Sharlett Iles 08/2008: normal left sided colo-colonic anastomosis from previous diverticular surgery, single small polyp that was HP on pathology.  I recommended recall screening at 10 year interval.  Has scibbolous stools chronically. Has never tried fiber supplements. She does take Colace which works intermittently  Overall stable weight.  Recent cologuard was positive; 06/2016  07/2014 echo showed LV EF 55-60%  She also mentioned her chronic left lower quadrant abdominal pains. These have not worsened. This is something she still with for a decade at least.  Intermittently bloody stools.  She is on Pletal for peripheral vascular disease  Review of systems: Pertinent positive and negative review of systems were noted in  the above HPI section. Complete review of systems was performed and was otherwise normal.   Past Medical History:  Diagnosis Date  . Anemia 2008  . Anxiety   . Arthritis    "all my joints ache" (09/07/2014)  . Asthma   . Bipolar 1 disorder ([place])   . Carpal tunnel syndrome   . Cervical cancer ([place]) 1985  . Chronic kidney disease (CKD), stage V ([place])   . COPD (chronic obstructive pulmonary disease) ([place]) 2000  . Coronary artery disease   . Daily headache   . Depression 2000  . Diverticulitis 2008  . GERD (gastroesophageal reflux disease)   . Heart murmur   . History of colon polyps 2009  . HLD (hyperlipidemia) 2013  . Hypertension 2013  . IBS (irritable bowel syndrome) 2008  . Pancreatitis 10/2011  . Pneumonia 07/2014  . RLS (restless legs syndrome)   . Schizophrenia ([place])   . Shortness of breath   . Small bowel obstruction 2008    Past Surgical History:  Procedure Laterality Date  . ABDOMINAL ANGIOGRAM N/A 09/07/2014   Procedure: ABDOMINAL ANGIOGRAM;  Surgeon: Laverda Page, MD;  Location: [facility] CATH LAB;  Service: Cardiovascular;  Laterality: N/A;  . APPENDECTOMY  1996  . COLON SURGERY  2008   6 inches of colon removed due to obstruction  . CORONARY ANGIOPLASTY WITH STENT PLACEMENT  09/07/2014   "2"  . LEFT HEART CATHETERIZATION WITH CORONARY ANGIOGRAM N/A 09/07/2014   Procedure: LEFT HEART CATHETERIZATION WITH CORONARY ANGIOGRAM;  Surgeon: Laverda Page, MD;  Location: [facility] CATH LAB;  Service: Cardiovascular;  Laterality:  N/A;  . PERIPHERAL VASCULAR CATHETERIZATION N/A 11/01/2015   Procedure: Renal Angiography;  Surgeon: Adrian Prows, MD;  Location: [facility] CV LAB;  Service: Cardiovascular;  Laterality: N/A;  . PERIPHERAL VASCULAR CATHETERIZATION N/A 04/10/2016   Procedure: Renal Angiography;  Surgeon: Adrian Prows, MD;  Location: [place] [facility] CV LAB;  Service: Cardiovascular;  Laterality: N/A;  . PERIPHERAL VASCULAR CATHETERIZATION  04/10/2016   Procedure: Peripheral  Vascular Intervention;  Surgeon: Adrian Prows, MD;  Location: [place] CV LAB;  Service: Cardiovascular;;  . RIGHT OOPHORECTOMY Right 1996  . TOTAL ABDOMINAL HYSTERECTOMY  1997    Current Outpatient Prescriptions  Medication Sig Dispense Refill  . acetaminophen (TYLENOL) 500 MG tablet Take 2,000 mg by mouth every 6 (six) hours as needed for headache.    . albuterol (PROVENTIL HFA;VENTOLIN HFA) 108 (90 BASE) MCG/ACT inhaler Inhale 2 puffs into the lungs 2 (two) times daily as needed (asthma).     . ALPRAZolam (XANAX) 0.5 MG tablet Take 1 mg by mouth 2 (two) times daily as needed for anxiety.     . ALPRAZolam (XANAX) 1 MG tablet Take 1 mg by mouth 2 (two) times daily as needed for anxiety.     Marland Kitchen amLODipine (NORVASC) 10 MG tablet Take 1 tablet (10 mg total) by mouth daily. 30 tablet 1  . aspirin EC 81 MG tablet Take 81 mg by mouth daily.    Marland Kitchen atorvastatin (LIPITOR) 80 MG tablet Take 80 mg by mouth daily.    . cholecalciferol (VITAMIN D) 1000 UNITS tablet Take 1,000 Units by mouth daily.    . cilostazol (PLETAL) 100 MG tablet Take 1 tablet (100 mg total) by mouth 2 (two) times daily. 60 tablet 1  . docusate sodium (COLACE) 100 MG capsule Take 100 mg by mouth daily as needed for mild constipation.     . famotidine (PEPCID) 20 MG tablet Take 1 tablet (20 mg total) by mouth 2 (two) times daily. (Patient taking differently: Take 20 mg by mouth daily. ) 30 tablet 6  . gabapentin (NEURONTIN) 400 MG capsule Take 1 capsule (400 mg total) by mouth 2 (two) times daily. Patient takes 400 mg 3 times daily and 1200 mg at bedtime (Patient taking differently: Take 800 mg by mouth at bedtime. )    . isosorbide mononitrate (IMDUR) 120 MG 24 hr tablet Take 60 mg by mouth daily.    . metoprolol (LOPRESSOR) 50 MG tablet Take 50 mg by mouth 2 (two) times daily.    . nitroGLYCERIN (NITROSTAT) 0.4 MG SL tablet Place 0.4 mg under the tongue every 5 (five) minutes as needed for chest pain.    . pantoprazole (PROTONIX) 40  MG tablet Take 40 mg by mouth daily.    Marland Kitchen venlafaxine XR (EFFEXOR XR) 150 MG 24 hr capsule Take 1 capsule (150 mg total) by mouth daily with breakfast. 30 capsule 0   No current facility-administered medications for this visit.     Allergies as of 10/02/2016 - Review Complete 10/02/2016  Allergen Reaction Noted  . Doxycycline Anaphylaxis and Hives   . Aspirin Other (See Comments) 10/16/2011  . Hydrocodone-acetaminophen Nausea And Vomiting   . Iohexol Itching 10/16/2011    Family History  Problem Relation Age of Onset  . Other Mother     many bowel obstructions  . Heart disease Mother   . Kidney cancer Father   . Bone cancer Father   . Diabetes Father   . Heart disease Father   . Diabetes Daughter  Social History   Social History  . Marital status: Divorced    Spouse name: N/A  . Number of children: 1  . Years of education: N/A   Occupational History  . grill cook/cashier Unemployed   Social History Main Topics  . Smoking status: Current Every Day Smoker    Packs/day: 0.50    Years: 40.00    Types: Cigarettes  . Smokeless tobacco: Never Used  . Alcohol use No  . Drug use: No  . Sexual activity: Yes    Birth control/ protection: Post-menopausal   Other Topics Concern  . Not on file   Social History Narrative  . No narrative on file     Physical Exam: BP 118/64   Pulse 70   Ht 5' 1.75" (1.568 m)   Wt 118 lb (53.5 kg)   BMI 21.76 kg/m  Constitutional: generally well-appearing Psychiatric: alert and oriented x3 Eyes: extraocular movements intact Mouth: oral pharynx moist, no lesions Neck: supple no lymphadenopathy Cardiovascular: heart regular rate and rhythm Lungs: clear to auscultation bilaterally Abdomen: soft, nontender, nondistended, no obvious ascites, no peritoneal signs, normal bowel sounds Extremities: no lower extremity edema bilaterally Skin: no lesions on visible extremities   Assessment and plan: 56 y.o. female with  Positive [place]  guard colon cancer screening test  For her positive colon cancer screening test she needs colonoscopy to check for colon cancer, large advanced polyps. We will arrange for that to be done at her soonest convenience. She tends to have scibbolous stools with straining frequently. She has never tried fiber supplements I recommended she try those now. Generally these are better than Colace. I see no reason for any further blood tests or imaging studies. She will have to hold her Pletal for 5 days before the procedure and we will communicate with her cardiologist to make sure he thinks that is safe for her to do.    Owens Loffler, MD [facility] 10/02/2016, 2:00 PM  Cc: Leamon Arnt, MD

## 2016-10-02 NOTE — Patient Instructions (Addendum)
You will be set up for a colonoscopy for + cologuard screening test [facility] hospital, next available).  Please start taking citrucel (orange flavored) powder fiber supplement.  This may cause some bloating at first but that usually goes away. Begin with a small spoonful and work your way up to a large, heaping spoonful daily over a week.  We will contact your prescriber of your Pletal

## 2016-10-04 ENCOUNTER — Encounter: Payer: Self-pay | Admitting: Cardiology

## 2016-10-04 NOTE — Telephone Encounter (Signed)
===  View-only below this line===  ----- Message ----- From: Adrian Prows, MD Sent: 10/04/2016   6:24 AM To: Oda Kilts, CMA Subject: Pletal                                         Hold Pletal for 6 days prior to procedure and restart same day if no biopsy otherwise 4-5 days later. Adrian Prows   Called patient to inform her of Dr Milderd Meager instructions   Patient verbally understands

## 2016-10-23 ENCOUNTER — Encounter (HOSPITAL_COMMUNITY): Payer: Self-pay | Admitting: *Deleted

## 2016-10-25 ENCOUNTER — Ambulatory Visit (HOSPITAL_COMMUNITY): Payer: Medicare Other | Admitting: Anesthesiology

## 2016-10-25 ENCOUNTER — Encounter (HOSPITAL_COMMUNITY)
Admission: RE | Disposition: A | Discharge status: Home / Self Care | Payer: Self-pay | Source: Ambulatory Visit | Attending: Gastroenterology

## 2016-10-25 ENCOUNTER — Encounter (HOSPITAL_COMMUNITY): Payer: Self-pay | Admitting: *Deleted

## 2016-10-25 ENCOUNTER — Ambulatory Visit (HOSPITAL_COMMUNITY)
Admission: RE | Admit: 2016-10-25 | Discharge: 2016-10-25 | Disposition: A | Discharge status: Home / Self Care | Payer: Medicare Other | Source: Ambulatory Visit | Attending: Gastroenterology | Admitting: Gastroenterology

## 2016-10-25 DIAGNOSIS — F209 Schizophrenia, unspecified: Secondary | ICD-10-CM | POA: Diagnosis not present

## 2016-10-25 DIAGNOSIS — D123 Benign neoplasm of transverse colon: Secondary | ICD-10-CM | POA: Diagnosis not present

## 2016-10-25 DIAGNOSIS — K219 Gastro-esophageal reflux disease without esophagitis: Secondary | ICD-10-CM | POA: Insufficient documentation

## 2016-10-25 DIAGNOSIS — E785 Hyperlipidemia, unspecified: Secondary | ICD-10-CM | POA: Insufficient documentation

## 2016-10-25 DIAGNOSIS — D125 Benign neoplasm of sigmoid colon: Secondary | ICD-10-CM | POA: Diagnosis not present

## 2016-10-25 DIAGNOSIS — D649 Anemia, unspecified: Secondary | ICD-10-CM | POA: Insufficient documentation

## 2016-10-25 DIAGNOSIS — Z86010 Personal history of colonic polyps: Secondary | ICD-10-CM | POA: Insufficient documentation

## 2016-10-25 DIAGNOSIS — K859 Acute pancreatitis without necrosis or infection, unspecified: Secondary | ICD-10-CM | POA: Diagnosis not present

## 2016-10-25 DIAGNOSIS — K589 Irritable bowel syndrome without diarrhea: Secondary | ICD-10-CM | POA: Insufficient documentation

## 2016-10-25 DIAGNOSIS — F329 Major depressive disorder, single episode, unspecified: Secondary | ICD-10-CM | POA: Diagnosis not present

## 2016-10-25 DIAGNOSIS — I2510 Atherosclerotic heart disease of native coronary artery without angina pectoris: Secondary | ICD-10-CM | POA: Diagnosis not present

## 2016-10-25 DIAGNOSIS — R195 Other fecal abnormalities: Secondary | ICD-10-CM

## 2016-10-25 DIAGNOSIS — G5600 Carpal tunnel syndrome, unspecified upper limb: Secondary | ICD-10-CM | POA: Diagnosis not present

## 2016-10-25 DIAGNOSIS — J449 Chronic obstructive pulmonary disease, unspecified: Secondary | ICD-10-CM | POA: Insufficient documentation

## 2016-10-25 DIAGNOSIS — D124 Benign neoplasm of descending colon: Secondary | ICD-10-CM | POA: Diagnosis not present

## 2016-10-25 DIAGNOSIS — Z79899 Other long term (current) drug therapy: Secondary | ICD-10-CM | POA: Diagnosis not present

## 2016-10-25 DIAGNOSIS — K5900 Constipation, unspecified: Secondary | ICD-10-CM

## 2016-10-25 DIAGNOSIS — I129 Hypertensive chronic kidney disease with stage 1 through stage 4 chronic kidney disease, or unspecified chronic kidney disease: Secondary | ICD-10-CM | POA: Diagnosis not present

## 2016-10-25 DIAGNOSIS — K56609 Unspecified intestinal obstruction, unspecified as to partial versus complete obstruction: Secondary | ICD-10-CM | POA: Insufficient documentation

## 2016-10-25 DIAGNOSIS — Z8541 Personal history of malignant neoplasm of cervix uteri: Secondary | ICD-10-CM | POA: Insufficient documentation

## 2016-10-25 DIAGNOSIS — F319 Bipolar disorder, unspecified: Secondary | ICD-10-CM | POA: Insufficient documentation

## 2016-10-25 DIAGNOSIS — Z1211 Encounter for screening for malignant neoplasm of colon: Secondary | ICD-10-CM | POA: Insufficient documentation

## 2016-10-25 DIAGNOSIS — G2581 Restless legs syndrome: Secondary | ICD-10-CM | POA: Diagnosis not present

## 2016-10-25 DIAGNOSIS — F17210 Nicotine dependence, cigarettes, uncomplicated: Secondary | ICD-10-CM | POA: Insufficient documentation

## 2016-10-25 DIAGNOSIS — M1990 Unspecified osteoarthritis, unspecified site: Secondary | ICD-10-CM | POA: Insufficient documentation

## 2016-10-25 DIAGNOSIS — Z7982 Long term (current) use of aspirin: Secondary | ICD-10-CM | POA: Insufficient documentation

## 2016-10-25 DIAGNOSIS — K635 Polyp of colon: Secondary | ICD-10-CM | POA: Insufficient documentation

## 2016-10-25 DIAGNOSIS — F419 Anxiety disorder, unspecified: Secondary | ICD-10-CM | POA: Diagnosis not present

## 2016-10-25 HISTORY — DX: Acute myocardial infarction, unspecified: I21.9

## 2016-10-25 HISTORY — PX: COLONOSCOPY WITH PROPOFOL: SHX5780

## 2016-10-25 SURGERY — COLONOSCOPY WITH PROPOFOL
Anesthesia: Monitor Anesthesia Care

## 2016-10-25 MED ORDER — PHENYLEPHRINE HCL 10 MG/ML IJ SOLN
INTRAMUSCULAR | Status: DC | PRN
  Administered 2016-10-25: 120 ug via INTRAVENOUS

## 2016-10-25 MED ORDER — PROPOFOL 500 MG/50ML IV EMUL
INTRAVENOUS | Status: DC | PRN
  Administered 2016-10-25: 250 ug/kg/min via INTRAVENOUS

## 2016-10-25 MED ORDER — PROPOFOL 10 MG/ML IV BOLUS
INTRAVENOUS | Status: AC
  Filled 2016-10-25: qty 20

## 2016-10-25 MED ORDER — PROPOFOL 10 MG/ML IV BOLUS
INTRAVENOUS | Status: AC
  Filled 2016-10-25: qty 40

## 2016-10-25 MED ORDER — SODIUM CHLORIDE 0.9 % IV SOLN: INTRAVENOUS | Status: DC

## 2016-10-25 MED ORDER — LACTATED RINGERS IV SOLN
INTRAVENOUS | Status: DC
  Administered 2016-10-25: 1000 mL via INTRAVENOUS

## 2016-10-25 MED ORDER — LIDOCAINE 2% (20 MG/ML) 5 ML SYRINGE
INTRAMUSCULAR | Status: AC
  Filled 2016-10-25: qty 5

## 2016-10-25 SURGICAL SUPPLY — 22 items

## 2016-10-25 NOTE — Transfer of Care (Signed)
Immediate Anesthesia Transfer of Care Note  Patient: [name]  Procedure(s) Performed: Procedure(s): COLONOSCOPY WITH PROPOFOL (N/A)  Patient Location: PACU  Anesthesia Type:MAC  Level of Consciousness: awake, alert , oriented and patient cooperative  Airway & Oxygen Therapy: Patient Spontanous Breathing and Patient connected to face mask oxygen  Post-op Assessment: Report given to RN, Post -op Vital signs reviewed and stable and Patient moving all extremities X 4  Post vital signs: stable  Last Vitals:  Vitals:   10/25/16 0813 10/25/16 0923  BP: (!) 157/77 (!) 110/52  Pulse: 83 (!) 56  Resp: 15 18  Temp: 36.4 C 36.7 C    Last Pain:  Vitals:   10/25/16 0923  TempSrc: Oral         Complications: No apparent anesthesia complications

## 2016-10-25 NOTE — Anesthesia Preprocedure Evaluation (Signed)
Anesthesia Evaluation  Patient identified by MRN, date of birth, ID band Patient awake    Reviewed: Allergy & Precautions, H&P , Patient's Chart, lab work & pertinent test results, reviewed documented beta blocker date and time   Airway Mallampati: II  TM Distance: >3 FB Neck ROM: full    Dental no notable dental hx.    Pulmonary Current Smoker,    Pulmonary exam normal breath sounds clear to auscultation       Cardiovascular hypertension,  Rhythm:regular Rate:Normal     Neuro/Psych    GI/Hepatic   Endo/Other    Renal/GU      Musculoskeletal   Abdominal   Peds  Hematology   Anesthesia Other Findings   Reproductive/Obstetrics                             Anesthesia Physical Anesthesia Plan  ASA: II  Anesthesia Plan: MAC   Post-op Pain Management:    Induction: Intravenous  Airway Management Planned: Mask and Natural Airway  Additional Equipment:   Intra-op Plan:   Post-operative Plan:   Informed Consent: I have reviewed the patients History and Physical, chart, labs and discussed the procedure including the risks, benefits and alternatives for the proposed anesthesia with the patient or authorized representative who has indicated his/her understanding and acceptance.   Dental Advisory Given  Plan Discussed with: CRNA and Surgeon  Anesthesia Plan Comments: (Discussed sedation and potential to need to place airway or ETT if warranted by clinical changes intra-operatively. We will start procedure as MAC.)        Anesthesia Quick Evaluation

## 2016-10-25 NOTE — Op Note (Signed)
[facility] Patient Name: [name] Procedure Date: 10/25/2016 MRN: [id number] Attending MD: Milus Banister , MD Date of Birth: 02-May-1961 CSN: [id number] Age: 56 Admit Type: Outpatient Procedure:                Colonoscopy Indications:              Positive Cologuard test Providers:                Milus Banister, MD, Vista Lawman, RN, William Dalton, Technician Referring MD:              Medicines:                Monitored Anesthesia Care Complications:            No immediate complications. Estimated blood loss:                            None. Estimated Blood Loss:     Estimated blood loss: none. Procedure:                Pre-Anesthesia Assessment:                           - Prior to the procedure, a History and Physical                            was performed, and patient medications and                            allergies were reviewed. [name] tolerance of                            previous anesthesia was also reviewed. The risks                            and benefits of the procedure and the sedation                            options and risks were discussed with the patient.                            All questions were answered, and informed consent                            was obtained. Prior Anticoagulants: The patient has                            taken Plavix (clopidogrel), last dose was 5 days                            prior to procedure. ASA Grade Assessment: III - A                            patient  with severe systemic disease. After                            reviewing the risks and benefits, the patient was                            deemed in satisfactory condition to undergo the                            procedure.                           After obtaining informed consent, the colonoscope                            was passed under direct vision. Throughout the   procedure, the patient's blood pressure, pulse, and                            oxygen saturations were monitored continuously. The                            EC-[id number] ([id number]) scope was introduced through                            the anus and advanced to the the cecum, identified                            by appendiceal orifice and ileocecal valve. The                            colonoscopy was performed without difficulty. The                            patient tolerated the procedure well. The quality                            of the bowel preparation was excellent. The                            ileocecal valve, appendiceal orifice, and rectum                            were photographed. Scope In: 8:46:29 AM Scope Out: 9:18:08 AM Scope Withdrawal Time: 0 hours 26 minutes 3 seconds  Total Procedure Duration: 0 hours 31 minutes 39 seconds  Findings:      Two sessile polyps were found in the descending colon and transverse       colon. The polyps were 3 to 4 mm in size. These polyps were removed with       a cold snare. Resection and retrieval were complete.      A 10 mm polyp was found in the distal sigmoid colon. The polyp was       sessile. The polyp was removed with a hot snare, piecemeal fashion.       Resection and  retrieval were complete.      Two sessile polyps were found in the recto-sigmoid colon. The polyps       were 4 to 5 mm in size. These polyps were removed with a cold snare.       Resection and retrieval were complete.      The exam was otherwise without abnormality on direct and retroflexion       views. Impression:               - Two 3 to 4 mm polyps in the descending colon and                            in the transverse colon, removed with a cold snare.                            Resected and retrieved. (jar 1)                           - One 10 mm polyp in the distal sigmoid colon,                            removed with a hot snare in piecemeal fashion.                             Resected and retrieved. (jar 2)                           - Two (hyperplastic appearing) 4 to 5 mm polyps at                            the recto-sigmoid colon, removed with a cold snare.                            Resected and retrieved. (jar 3)                           - The examination was otherwise normal on direct                            and retroflexion views. Moderate Sedation:      N/A- Per Anesthesia Care Recommendation:           - Patient has a contact number available for                            emergencies. The signs and symptoms of potential                            delayed complications were discussed with the                            patient. Return to normal activities tomorrow.                            Written discharge instructions were  provided to the                            patient.                           - Resume previous diet.                           - Continue present medications.                           - Repeat colonoscopy is recommended. The                            colonoscopy date will be determined after pathology                            results from today's exam become available for                            review. Possibly you will need repeat flexible                            sigmoidoscopy in 6 months given the piecemeal                            resection needed for the distal sigmoid polyp.                           - Please start the fiber supplements which we spoke                            about at the time of your office visit. Procedure Code(s):        --- Professional ---                           [phone], Colonoscopy, flexible; with removal of                            tumor(s), polyp(s), or other lesion(s) by snare                            technique Diagnosis Code(s):        --- Professional ---                           D12.4, Benign neoplasm of descending colon                            D12.3, Benign neoplasm of transverse colon (hepatic                            flexure or splenic flexure)  D12.7, Benign neoplasm of rectosigmoid junction                           D12.5, Benign neoplasm of sigmoid colon                           R19.5, Other fecal abnormalities CPT copyright 2016 American Medical Association. All rights reserved. The codes documented in this report are preliminary and upon coder review may  be revised to meet current compliance requirements. Milus Banister, MD 10/25/2016 9:29:22 AM This report has been signed electronically. Number of Addenda: 0

## 2016-10-25 NOTE — Interval H&P Note (Signed)
History and Physical Interval Note:  10/25/2016 8:29 AM  [name]  has presented today for surgery, with the diagnosis of Positive cologard  The various methods of treatment have been discussed with the patient and family. After consideration of risks, benefits and other options for treatment, the patient has consented to  Procedure(s): COLONOSCOPY WITH PROPOFOL (N/A) as a surgical intervention .  [name] history has been reviewed, patient examined, no change in status, stable for surgery.  I have reviewed the patient's chart and labs.  Questions were answered to the patient's satisfaction.     Milus Banister

## 2016-10-25 NOTE — Discharge Instructions (Signed)

## 2016-10-25 NOTE — H&P (View-Only) (Signed)
HPI: This is a  A pleasant 56 yo woman   who was referred to me by Leamon Arnt, MD  to evaluate  + cologuard screening test by her PCP   Chief complaint is *+ cologuard, also chronic (decade) abd pains.  Below is cut/pasted from 2013 office note from her previous gastroenterologist, Dr. Sharlett Iles at her most recent visit with [place]: "This is a very complex and involved 56 year old Caucasian female schizophrenic, heavy smoker with COPD who has chronic left mid abdominal pain for greater than 8 years.  She had perforated diverticulitis in 2008 that required partial colectomy by Dr. Hulen Skains.  She's had abdominal pain ever since that illness..  She underwent endoscopy and colonoscopy on several occasions, last performed in 2009,both which were unremarkable. She continues with steady abdominal pain unrelated to bowel movements, eating, or any other alleviating or precipitating factors.  She is in and out of the emergency room with pain, and had a CT scan in January of 2012 that was reviewed and was unremarkable.  She was told at that time that she had "pancreatitis".  Review of her record shows no previous gallbladder ultrasound exam."   She had colonoscopy by me, referred by Dr. Sharlett Iles 08/2008: normal left sided colo-colonic anastomosis from previous diverticular surgery, single small polyp that was HP on pathology.  I recommended recall screening at 10 year interval.  Has scibbolous stools chronically. Has never tried fiber supplements. She does take Colace which works intermittently  Overall stable weight.  Recent cologuard was positive; 06/2016  07/2014 echo showed LV EF 55-60%  She also mentioned her chronic left lower quadrant abdominal pains. These have not worsened. This is something she still with for a decade at least.  Intermittently bloody stools.  She is on Pletal for peripheral vascular disease  Review of systems: Pertinent positive and negative review of systems were noted in  the above HPI section. Complete review of systems was performed and was otherwise normal.   Past Medical History:  Diagnosis Date  . Anemia 2008  . Anxiety   . Arthritis    "all my joints ache" (09/07/2014)  . Asthma   . Bipolar 1 disorder ([place])   . Carpal tunnel syndrome   . Cervical cancer ([place]) 1985  . Chronic kidney disease (CKD), stage V ([place])   . COPD (chronic obstructive pulmonary disease) ([place]) 2000  . Coronary artery disease   . Daily headache   . Depression 2000  . Diverticulitis 2008  . GERD (gastroesophageal reflux disease)   . Heart murmur   . History of colon polyps 2009  . HLD (hyperlipidemia) 2013  . Hypertension 2013  . IBS (irritable bowel syndrome) 2008  . Pancreatitis 10/2011  . Pneumonia 07/2014  . RLS (restless legs syndrome)   . Schizophrenia ([place])   . Shortness of breath   . Small bowel obstruction 2008    Past Surgical History:  Procedure Laterality Date  . ABDOMINAL ANGIOGRAM N/A 09/07/2014   Procedure: ABDOMINAL ANGIOGRAM;  Surgeon: Laverda Page, MD;  Location: [facility] CATH LAB;  Service: Cardiovascular;  Laterality: N/A;  . APPENDECTOMY  1996  . COLON SURGERY  2008   6 inches of colon removed due to obstruction  . CORONARY ANGIOPLASTY WITH STENT PLACEMENT  09/07/2014   "2"  . LEFT HEART CATHETERIZATION WITH CORONARY ANGIOGRAM N/A 09/07/2014   Procedure: LEFT HEART CATHETERIZATION WITH CORONARY ANGIOGRAM;  Surgeon: Laverda Page, MD;  Location: [facility] CATH LAB;  Service: Cardiovascular;  Laterality:  N/A;  . PERIPHERAL VASCULAR CATHETERIZATION N/A 11/01/2015   Procedure: Renal Angiography;  Surgeon: Adrian Prows, MD;  Location: [place] CV LAB;  Service: Cardiovascular;  Laterality: N/A;  . PERIPHERAL VASCULAR CATHETERIZATION N/A 04/10/2016   Procedure: Renal Angiography;  Surgeon: Adrian Prows, MD;  Location: [place] CV LAB;  Service: Cardiovascular;  Laterality: N/A;  . PERIPHERAL VASCULAR CATHETERIZATION  04/10/2016   Procedure: Peripheral  Vascular Intervention;  Surgeon: Adrian Prows, MD;  Location: [place] CV LAB;  Service: Cardiovascular;;  . RIGHT OOPHORECTOMY Right 1996  . TOTAL ABDOMINAL HYSTERECTOMY  1997    Current Outpatient Prescriptions  Medication Sig Dispense Refill  . acetaminophen (TYLENOL) 500 MG tablet Take 2,000 mg by mouth every 6 (six) hours as needed for headache.    . albuterol (PROVENTIL HFA;VENTOLIN HFA) 108 (90 BASE) MCG/ACT inhaler Inhale 2 puffs into the lungs 2 (two) times daily as needed (asthma).     . ALPRAZolam (XANAX) 0.5 MG tablet Take 1 mg by mouth 2 (two) times daily as needed for anxiety.     . ALPRAZolam (XANAX) 1 MG tablet Take 1 mg by mouth 2 (two) times daily as needed for anxiety.     Marland Kitchen amLODipine (NORVASC) 10 MG tablet Take 1 tablet (10 mg total) by mouth daily. 30 tablet 1  . aspirin EC 81 MG tablet Take 81 mg by mouth daily.    Marland Kitchen atorvastatin (LIPITOR) 80 MG tablet Take 80 mg by mouth daily.    . cholecalciferol (VITAMIN D) 1000 UNITS tablet Take 1,000 Units by mouth daily.    . cilostazol (PLETAL) 100 MG tablet Take 1 tablet (100 mg total) by mouth 2 (two) times daily. 60 tablet 1  . docusate sodium (COLACE) 100 MG capsule Take 100 mg by mouth daily as needed for mild constipation.     . famotidine (PEPCID) 20 MG tablet Take 1 tablet (20 mg total) by mouth 2 (two) times daily. (Patient taking differently: Take 20 mg by mouth daily. ) 30 tablet 6  . gabapentin (NEURONTIN) 400 MG capsule Take 1 capsule (400 mg total) by mouth 2 (two) times daily. Patient takes 400 mg 3 times daily and 1200 mg at bedtime (Patient taking differently: Take 800 mg by mouth at bedtime. )    . isosorbide mononitrate (IMDUR) 120 MG 24 hr tablet Take 60 mg by mouth daily.    . metoprolol (LOPRESSOR) 50 MG tablet Take 50 mg by mouth 2 (two) times daily.    . nitroGLYCERIN (NITROSTAT) 0.4 MG SL tablet Place 0.4 mg under the tongue every 5 (five) minutes as needed for chest pain.    . pantoprazole (PROTONIX) 40  MG tablet Take 40 mg by mouth daily.    Marland Kitchen venlafaxine XR (EFFEXOR XR) 150 MG 24 hr capsule Take 1 capsule (150 mg total) by mouth daily with breakfast. 30 capsule 0   No current facility-administered medications for this visit.     Allergies as of 10/02/2016 - Review Complete 10/02/2016  Allergen Reaction Noted  . Doxycycline Anaphylaxis and Hives   . Aspirin Other (See Comments) 10/16/2011  . Hydrocodone-acetaminophen Nausea And Vomiting   . Iohexol Itching 10/16/2011    Family History  Problem Relation Age of Onset  . Other Mother     many bowel obstructions  . Heart disease Mother   . Kidney cancer Father   . Bone cancer Father   . Diabetes Father   . Heart disease Father   . Diabetes Daughter  Social History   Social History  . Marital status: Divorced    Spouse name: N/A  . Number of children: 1  . Years of education: N/A   Occupational History  . grill cook/cashier Unemployed   Social History Main Topics  . Smoking status: Current Every Day Smoker    Packs/day: 0.50    Years: 40.00    Types: Cigarettes  . Smokeless tobacco: Never Used  . Alcohol use No  . Drug use: No  . Sexual activity: Yes    Birth control/ protection: Post-menopausal   Other Topics Concern  . Not on file   Social History Narrative  . No narrative on file     Physical Exam: BP 118/64   Pulse 70   Ht 5' 1.75" (1.568 m)   Wt 118 lb (53.5 kg)   BMI 21.76 kg/m  Constitutional: generally well-appearing Psychiatric: alert and oriented x3 Eyes: extraocular movements intact Mouth: oral pharynx moist, no lesions Neck: supple no lymphadenopathy Cardiovascular: heart regular rate and rhythm Lungs: clear to auscultation bilaterally Abdomen: soft, nontender, nondistended, no obvious ascites, no peritoneal signs, normal bowel sounds Extremities: no lower extremity edema bilaterally Skin: no lesions on visible extremities   Assessment and plan: 56 y.o. female with  Positive [place]  guard colon cancer screening test  For her positive colon cancer screening test she needs colonoscopy to check for colon cancer, large advanced polyps. We will arrange for that to be done at her soonest convenience. She tends to have scibbolous stools with straining frequently. She has never tried fiber supplements I recommended she try those now. Generally these are better than Colace. I see no reason for any further blood tests or imaging studies. She will have to hold her Pletal for 5 days before the procedure and we will communicate with her cardiologist to make sure he thinks that is safe for her to do.    Owens Loffler, MD [facility] 10/02/2016, 2:00 PM  Cc: Leamon Arnt, MD

## 2016-10-25 NOTE — Anesthesia Postprocedure Evaluation (Signed)
Anesthesia Post Note  Patient: [name]  Procedure(s) Performed: Procedure(s) (LRB): COLONOSCOPY WITH PROPOFOL (N/A)  Patient location during evaluation: PACU Anesthesia Type: MAC Level of consciousness: awake and alert Pain management: pain level controlled Vital Signs Assessment: post-procedure vital signs reviewed and stable Respiratory status: spontaneous breathing, nonlabored ventilation, respiratory function stable and patient connected to nasal cannula oxygen Cardiovascular status: stable and blood pressure returned to baseline Anesthetic complications: no       Last Vitals:  Vitals:   10/25/16 0930 10/25/16 0950  BP: 133/60 (!) 154/83  Pulse:    Resp:    Temp:      Last Pain:  Vitals:   10/25/16 0923  TempSrc: Oral                 Aubriee Szeto EDWARD

## 2016-10-26 ENCOUNTER — Encounter (HOSPITAL_COMMUNITY): Payer: Self-pay | Admitting: Gastroenterology

## 2017-02-15 ENCOUNTER — Encounter: Payer: Self-pay | Admitting: Gastroenterology

## 2017-02-22 ENCOUNTER — Encounter: Payer: Self-pay | Admitting: Gastroenterology

## 2017-03-05 ENCOUNTER — Ambulatory Visit (INDEPENDENT_AMBULATORY_CARE_PROVIDER_SITE_OTHER)
Admission: RE | Admit: 2017-03-05 | Discharge: 2017-03-05 | Disposition: A | Discharge status: Home / Self Care | Payer: Medicare Other | Source: Ambulatory Visit | Attending: Internal Medicine | Admitting: Internal Medicine

## 2017-03-05 ENCOUNTER — Ambulatory Visit (INDEPENDENT_AMBULATORY_CARE_PROVIDER_SITE_OTHER): Payer: Medicare Other | Admitting: Internal Medicine

## 2017-03-05 ENCOUNTER — Encounter: Payer: Self-pay | Admitting: Internal Medicine

## 2017-03-05 VITALS — BP 90/60 | HR 74 | Ht 61.75 in | Wt 118.0 lb

## 2017-03-05 DIAGNOSIS — J449 Chronic obstructive pulmonary disease, unspecified: Secondary | ICD-10-CM

## 2017-03-05 DIAGNOSIS — I10 Essential (primary) hypertension: Secondary | ICD-10-CM | POA: Diagnosis not present

## 2017-03-05 DIAGNOSIS — F17210 Nicotine dependence, cigarettes, uncomplicated: Secondary | ICD-10-CM | POA: Diagnosis not present

## 2017-03-05 MED ORDER — MOMETASONE FURO-FORMOTEROL FUM 100-5 MCG/ACT IN AERO: 2.0000 | INHALATION_SPRAY | Freq: Two times a day (BID) | RESPIRATORY_TRACT | 0 refills | Status: DC

## 2017-03-05 MED ORDER — ATENOLOL 50 MG PO TABS: 50.0000 mg | ORAL_TABLET | Freq: Every day | ORAL | Status: DC

## 2017-03-05 MED ORDER — MOMETASONE FURO-FORMOTEROL FUM 100-5 MCG/ACT IN AERO: 2.0000 | INHALATION_SPRAY | Freq: Two times a day (BID) | RESPIRATORY_TRACT | 11 refills | Status: DC

## 2017-03-05 NOTE — Patient Instructions (Addendum)
Add pepcid 20 mg (over the counter)  at bedtime to see if it helps your night time breathing issues  Continue protonix Take 30-60 min before first meal of the day   GERD (REFLUX)  is an extremely common cause of respiratory symptoms just like yours , many times with no obvious heartburn at all.    It can be treated with medication, but also with lifestyle changes including elevation of the head of your bed (ideally with 6 inch  bed blocks),  Smoking cessation, avoidance of late meals, excessive alcohol, and avoid fatty foods, chocolate, peppermint, colas, red wine, and acidic juices such as orange juice.  NO MINT OR MENTHOL PRODUCTS SO NO COUGH DROPS   USE SUGARLESS CANDY INSTEAD (Jolley ranchers or Stover's or Life Savers) or even ice chips will also do - the key is to swallow to prevent all throat clearing. NO OIL BASED VITAMINS - use powdered substitutes.   The key is to stop smoking completely before smoking completely stops you!   dulera 100 Take 2 puffs first thing in am and then another 2 puffs about 12 hours later - if not helping don't fill the prescription .   Work on inhaler technique:  relax and gently blow all the way out then take a nice smooth deep breath back in, triggering the inhaler at same time you start breathing in.  Hold for up to 5 seconds if you can. Blow out thru nose. Rinse and gargle with water when done  Only use your albuterol as a rescue medication to be used if you can't catch your breath by resting or doing a relaxed purse lip breathing pattern.  - The less you use it, the better it will work when you need it. - Ok to use up to 2 puffs  every 4 hours if you must but call for immediate appointment if use goes up over your usual need - Don't leave home without it !!  (think of it like the spare tire for your car)     Please schedule a follow up office visit in 6 weeks, call sooner if needed with all medications /inhalers/ solutions in hand so we can verify  exactly what you are taking. This includes all medications from all doctors and over the counters

## 2017-03-05 NOTE — Progress Notes (Signed)
Subjective:     [name], female   DOB: [birth year],    MRN: [id number]  HPI  45 yowf active smoker with doe x 2000 dx as copd "[facility] at [facility] point but progressed to point where sob whenever lie down assoc with congested cough and no better on Bevespi but better on ventolin so referred to pulmonary clinic 03/05/2017 by Dr   Billey Chang for copd eval but did not meet spirometric criteria for copd on initial encounter    03/05/2017 1st [facility] office visit/ Tyjai Matuszak   Chief Complaint  Patient presents with  . Pulmonary Consult    Referred by Dr. Alcide Goodness. Pt states she was dxed with COPD/Emphysema since 2000.  She states her breathing is worse when she lies down flat and also with exertion such as walking from room to room at home.   onset was 2000 doe indolent / progressive  but quite variable best days doe = MMRC1 = can walk nl pace, flat grade, can't hurry or go uphills or steps s sob  Esp p prednisone last rx April  And maybe still needs albuterol maybe once a day around lunch Am mucus x 20 min x sev tbsp yellowish mucus   Not able to lie flat x 2 years wakes up around 1 am and sometimes stays up for hours due to sob but never tries noct ventolin  No obvious day to day or daytime variability or assoc   mucus plugs or hemoptysis or cp or chest tightness, subjective wheeze or overt sinus or hb symptoms. No unusual exp hx or h/o childhood pna/ asthma or knowledge of premature birth.   Also denies any obvious fluctuation of symptoms with weather or environmental changes or other aggravating or alleviating factors except as outlined above   Current Medications, Allergies, Complete Past Medical History, Past Surgical History, Family History, and Social History were reviewed in [employer] record.  ROS  The following are not active complaints unless bolded sore throat, dysphagia, dental problems, itching, sneezing,  nasal congestion or excess/  purulent secretions, ear ache,   fever, chills, sweats, unintended wt loss, classically pleuritic or exertional cp,  orthopnea pnd or leg swelling, presyncope, palpitations, abdominal pain, anorexia, nausea, vomiting, diarrhea  or change in bowel or bladder habits, change in stools or urine, dysuria,hematuria,  rash, arthralgias, visual complaints, headache, numbness, weakness or ataxia or problems with walking or coordination,  change in mood/affect or memory.            Review of Systems     Objective:   Physical Exam     amb wf nad with very congested sounding coughing and about 10 y older in appearance than stated age   Wt Readings from Last 3 Encounters:  03/05/17 118 lb (53.5 kg)  10/25/16 118 lb (53.5 kg)  10/02/16 118 lb (53.5 kg)    Vital signs reviewed   HEENT: nl  turbinates bilaterally, and oropharynx. Nl external ear canals without cough reflex - edentulous   NECK :  without JVD/Nodes/TM/ nl carotid upstrokes bilaterally   LUNGS: no acc muscle use,  slt barrel chested  With scattered but minimal insp and exp rhonchi bilaterally    CV:  RRR  no s3 or murmur or increase in P2, and no edema   ABD:  soft and nontender with nl inspiratory excursion in the supine position. No bruits or organomegaly appreciated, bowel sounds nl  MS:  Nl gait/  ext warm without deformities, calf tenderness, cyanosis or clubbing No obvious joint restrictions   SKIN: warm and dry without lesions    NEURO:  alert, approp, nl sensorium with  no motor or cerebellar deficits apparent.    CXR PA and Lateral:   03/05/2017 :    I personally reviewed images and agree with radiology impression as follows:   No active cardiopulmonary disease.      Assessment:

## 2017-03-05 NOTE — Assessment & Plan Note (Signed)

## 2017-03-05 NOTE — Assessment & Plan Note (Signed)
Strongly prefer in this setting: Bystolic, the most beta -1  selective Beta blocker available in sample form, with bisoprolol the most selective generic choice  on the market.   Try bisoprolol 5 mg daily

## 2017-03-06 ENCOUNTER — Encounter: Payer: Self-pay | Admitting: Internal Medicine

## 2017-03-06 NOTE — Progress Notes (Signed)
Spoke with pt and notified of results per Dr. Wert. Pt verbalized understanding and denied any questions. 

## 2017-03-06 NOTE — Assessment & Plan Note (Addendum)
Spirometry 03/05/2017  FEV1 1.39 (56%)  Ratio 76 with mild curvature no rx prior - 03/05/2017  After extensive coaching HFA effectiveness =    75% try dulera 100 2bid    She does not meet criteria for copd but does have classic CB hx/ findings and may benefit from rx for AB with lose dose dulera but the most impt aspect of her care is to stop smoking (see separate a/p)   She may also have a component of noct gerd masquerading as noct asthma so needs max gerd rx until returns / discussed     Total time devoted to counseling  > 50 % of initial 60 min office visit:  review case with pt/ discussion of options/alternatives/ personally creating written customized instructions  in presence of pt  then going over those specific  Instructions directly with the pt including how to use all of the meds but in particular covering each new medication in detail and the difference between the maintenance= "automatic" meds and the prns using an action plan format for the latter (If this problem/symptom => do that organization reading Left to right).  Please see AVS from this visit for a full list of these instructions which I personally wrote for this pt and  are unique to this visit.

## 2017-03-07 ENCOUNTER — Ambulatory Visit (INDEPENDENT_AMBULATORY_CARE_PROVIDER_SITE_OTHER): Payer: Medicare Other | Admitting: Physician Assistant

## 2017-03-07 ENCOUNTER — Encounter: Payer: Self-pay | Admitting: Physician Assistant

## 2017-03-07 VITALS — BP 124/72 | HR 66 | Ht 61.75 in | Wt 118.4 lb

## 2017-03-07 DIAGNOSIS — R1314 Dysphagia, pharyngoesophageal phase: Secondary | ICD-10-CM

## 2017-03-07 DIAGNOSIS — Z7901 Long term (current) use of anticoagulants: Secondary | ICD-10-CM | POA: Diagnosis not present

## 2017-03-07 DIAGNOSIS — Z86010 Personal history of colonic polyps: Secondary | ICD-10-CM

## 2017-03-07 NOTE — Progress Notes (Signed)
Chief Complaint: Dysphagia, history of adenomatous polyps  HPI:  [name] is a 56 year old Caucasian female with a past medical history of anxiety, schizophrenia, heavy smoking with COPD, CKD, CAD maintained on Plavix, reflux and multiple others listed below, who presents to clinic today to schedule her repeat flex sigmoidoscopy as instructed by Dr. Ardis Hughs after time of colonoscopy in January and for a complaint of dysphagia.    Per review of chart it appears patient's last colonoscopy was performed 10/25/16 then for a positive cologuard test. At that time, she had finding of 2 3-4 mm polyps in the descending colon and transverse colon, one 10 mm polyp in the distal sigmoid colon removed with a hot snare in piecemeal factious fashion, 2 4-5 mm polyps at the rectosigmoid colon and otherwise normal exam. Pathology revealed tubular adenomas and hyperplastic polyps. It was recommended patient have a repeat flex sigmoidoscopy in 6 months due to the fact that a tubular adenoma was removed piecemeal fashion at that time.   Today, the patient describes that she knows she is due for a repeat procedure due to finding of polyps at last colonoscopy. Her primary complaint today is of dysphagia telling me that this occurs at least 6 times per week. This can happen with her pills or solid food but never occurs with liquids. She tells me that everything feels as though it get stuck on its way down and will eventually "melt" down", or it is encouraged with some drinking of water. This has happened to the patient before and required dilation per report.   Patient does have chronic reflux symptoms which she tells me are terrible if she is not on her medicine. She takes Protonix 40 mg once daily and Pepcid twice a day.   Patient does use Plavix on a daily basis for her coronary artery disease.   Patient denies fever, chills, melena, weight loss, fatigue, anorexia, nausea, vomiting, abdominal pain or symptoms that awaken  her at night.  Past Medical History:  Diagnosis Date  . Anemia 2008  . Anxiety   . Arthritis    "all my joints ache" (09/07/2014)  . Asthma   . Bipolar 1 disorder ([place])   . Carpal tunnel syndrome   . Cervical cancer ([place]) 1985  . Chronic kidney disease (CKD), stage V ([place])   . COPD (chronic obstructive pulmonary disease) ([place]) 2000  . Coronary artery disease   . Daily headache   . Depression 2000  . Diverticulitis 2008  . GERD (gastroesophageal reflux disease)   . Heart murmur   . History of colon polyps 2009  . HLD (hyperlipidemia) 2013  . Hypertension 2013  . IBS (irritable bowel syndrome) 2008  . Myocardial infarction ([place])    2015  . Pancreatitis 10/2011  . Pneumonia 07/2014  . RLS (restless legs syndrome)   . Schizophrenia ([place])   . Shortness of breath   . Small bowel obstruction ([place]) 2008    Past Surgical History:  Procedure Laterality Date  . ABDOMINAL ANGIOGRAM N/A 09/07/2014   Procedure: ABDOMINAL ANGIOGRAM;  Surgeon: Laverda Page, MD;  Location: [facility] CATH LAB;  Service: Cardiovascular;  Laterality: N/A;  . APPENDECTOMY  1996  . COLON SURGERY  2008   6 inches of colon removed due to obstruction  . COLONOSCOPY WITH PROPOFOL N/A 10/25/2016   Procedure: COLONOSCOPY WITH PROPOFOL;  Surgeon: Milus Banister, MD;  Location: [facility] ENDOSCOPY;  Service: Endoscopy;  Laterality: N/A;  . CORONARY ANGIOPLASTY WITH STENT PLACEMENT  09/07/2014   "2"  . LEFT HEART CATHETERIZATION WITH CORONARY ANGIOGRAM N/A 09/07/2014   Procedure: LEFT HEART CATHETERIZATION WITH CORONARY ANGIOGRAM;  Surgeon: Laverda Page, MD;  Location: [facility] CATH LAB;  Service: Cardiovascular;  Laterality: N/A;  . PERIPHERAL VASCULAR CATHETERIZATION N/A 11/01/2015   Procedure: Renal Angiography;  Surgeon: Adrian Prows, MD;  Location: [place] CV LAB;  Service: Cardiovascular;  Laterality: N/A;  . PERIPHERAL VASCULAR CATHETERIZATION N/A 04/10/2016   Procedure: Renal Angiography;  Surgeon: Adrian Prows, MD;   Location: [place] CV LAB;  Service: Cardiovascular;  Laterality: N/A;  . PERIPHERAL VASCULAR CATHETERIZATION  04/10/2016   Procedure: Peripheral Vascular Intervention;  Surgeon: Adrian Prows, MD;  Location: [place] [facility] CV LAB;  Service: Cardiovascular;;  . RIGHT OOPHORECTOMY Right 1996  . TOTAL ABDOMINAL HYSTERECTOMY  1997    Current Outpatient Prescriptions  Medication Sig Dispense Refill  . albuterol (PROVENTIL HFA;VENTOLIN HFA) 108 (90 BASE) MCG/ACT inhaler Inhale 2 puffs into the lungs 2 (two) times daily as needed (asthma).     Marland Kitchen amLODipine (NORVASC) 10 MG tablet Take 1 tablet (10 mg total) by mouth daily. 30 tablet 1  . aspirin EC 81 MG tablet Take 81 mg by mouth daily.    Marland Kitchen atenolol (TENORMIN) 50 MG tablet Take 1 tablet (50 mg total) by mouth daily.    Marland Kitchen atorvastatin (LIPITOR) 80 MG tablet Take 80 mg by mouth daily.    . chlorthalidone (HYGROTON) 25 MG tablet Take 25 mg by mouth daily.    . clopidogrel (PLAVIX) 75 MG tablet Take 75 mg by mouth daily.    . famotidine (PEPCID) 20 MG tablet Take 1 tablet (20 mg total) by mouth 2 (two) times daily. (Patient taking differently: Take 20 mg by mouth daily. ) 30 tablet 6  . gabapentin (NEURONTIN) 400 MG capsule Take 1 capsule (400 mg total) by mouth 2 (two) times daily. Patient takes 400 mg 3 times daily and 1200 mg at bedtime (Patient taking differently: Take 400 mg by mouth daily. )    . isosorbide mononitrate (IMDUR) 120 MG 24 hr tablet Take 60 mg by mouth daily.    . metoprolol (LOPRESSOR) 50 MG tablet Take 50 mg by mouth 2 (two) times daily.    . mometasone-formoterol (DULERA) 100-5 MCG/ACT AERO Inhale 2 puffs into the lungs 2 (two) times daily. 1 Inhaler 11  . nitroGLYCERIN (NITROSTAT) 0.4 MG SL tablet Place 0.4 mg under the tongue every 5 (five) minutes as needed for chest pain.    . pantoprazole (PROTONIX) 40 MG tablet Take 40 mg by mouth daily.    Marland Kitchen venlafaxine XR (EFFEXOR XR) 150 MG 24 hr capsule Take 1 capsule (150 mg total) by mouth  daily with breakfast. 30 capsule 0   No current facility-administered medications for this visit.     Allergies as of 03/07/2017 - Review Complete 03/06/2017  Allergen Reaction Noted  . Doxycycline Anaphylaxis and Hives   . Aspirin Other (See Comments) 10/16/2011  . Hydrocodone-acetaminophen Nausea And Vomiting   . Iohexol Itching 10/16/2011    Family History  Problem Relation Age of Onset  . Other Mother        many bowel obstructions  . Heart disease Mother   . Kidney cancer Father   . Bone cancer Father   . Diabetes Father   . Heart disease Father   . Diabetes Daughter     Social History   Social History  . Marital status: Divorced    Spouse name:  N/A  . Number of children: 1  . Years of education: N/A   Occupational History  . grill cook/cashier Unemployed   Social History Main Topics  . Smoking status: Current Every Day Smoker    Packs/day: 0.50    Years: 40.00    Types: Cigarettes  . Smokeless tobacco: Never Used  . Alcohol use No  . Drug use: No  . Sexual activity: Yes    Birth control/ protection: Post-menopausal   Other Topics Concern  . Not on file   Social History Narrative  . No narrative on file    Review of Systems:    Constitutional: No weight loss, fever or chills Cardiovascular: No chest pain Respiratory:Chronic SOB Gastrointestinal: See HPI and otherwise negative   Physical Exam:  Vital signs: BP 124/72   Pulse 66   Ht 5' 1.75" (1.568 m)   Wt 118 lb 6.4 oz (53.7 kg)   SpO2 94%   BMI 21.83 kg/m    Constitutional:   Pleasant Caucasian female appears to be in NAD, Well developed, Well nourished, alert and cooperative Respiratory: Respirations even and unlabored. Lungs clear to auscultation bilaterally.   No wheezes, crackles, or rhonchi.  Cardiovascular: Normal S1, S2. No MRG. Regular rate and rhythm. No peripheral edema, cyanosis or pallor.  Gastrointestinal:  Soft, nondistended, mild LLQ ttp. No rebound or guarding. Normal  bowel sounds. No appreciable masses or hepatomegaly. Psychiatric:  Demonstrates good judgement and reason without abnormal affect or behaviors.  No recent labs or imaging.  Assessment: 1. Dysphagia: Worsening over the past 6 months, patient has history of esophageal stricture last dilated in 2006; likely this represents the same 2. History of adenomatous polyps: Last colonoscopy in January of this year, tubular adenoma was removed in piecemeal fashion from rectosigmoid junction and repeat flexible sigmoidoscopy was recommended in 6 months  Plan: 1. Scheduled patient for an EGD with dilation and a flexible sigmoidoscopy with Dr. Ardis Hughs in the [facility]. Did discuss risks, benefits, limitations and alternatives and the patient agrees to proceed. 2. Patient to continue her Pantoprazole 40 mg daily and Pepcid 20 mg twice a day 3. Reviewed anti-dysphagia measures including taking small bites, chewing well, avoiding distraction while eating and the chin tuck technique 4. Patient was advised to hold her Plavix for 5 days prior to her procedure. We will communicate with her cardiologist to ensure that holding her Plavix is acceptable. 5. Patient to return to clinic per recommendation from Dr. Ardis Hughs after time of procedures.  Ellouise Newer, PA-C [facility] 03/07/2017, 1:19 PM  Cc: Leamon Arnt, MD

## 2017-03-07 NOTE — Patient Instructions (Addendum)
You have been scheduled for a flexible sigmoidoscopy and endoscopy. Please follow the written instructions given to you at your visit today. If you use inhalers (even only as needed), please bring them with you on the day of your procedure.

## 2017-03-08 NOTE — Progress Notes (Signed)
I agree with the above note, plan 

## 2017-03-15 ENCOUNTER — Telehealth: Payer: Self-pay | Admitting: Emergency Medicine

## 2017-03-15 NOTE — Telephone Encounter (Signed)
Received fax from Dr. Irven Shelling office ok to hold Plavix for 5 days. Attempted to call patient and her voicemail is not set up. Will try and call back later.     [name] September 06, 1961 [id number]  Dear Dr. Einar Gip:  We have scheduled the above named patient for a(n) endoscopy procedure. Our records show that (s)he is on anticoagulation therapy.  Please advise as to whether the patient may come off their therapy of Plavix 5 days prior to their procedure which is scheduled for 05-08-17.  Please route your response to Tinnie Gens, CMA or fax response to [phone].    Sincerely,  Tinnie Gens, [facility]

## 2017-04-12 NOTE — Anesthesia Postprocedure Evaluation (Signed)
Anesthesia Post Note  Patient: [name]  Procedure(s) Performed: Procedure(s) (LRB): COLONOSCOPY WITH PROPOFOL (N/A)     Anesthesia Post Evaluation  Last Vitals:  Vitals:   10/25/16 0930 10/25/16 0950  BP: 133/60 (!) 154/83  Pulse:    Resp:    Temp:      Last Pain:  Vitals:   10/29/16 1054  TempSrc:   PainSc: 0-No pain                 Lakecia Deschamps EDWARD

## 2017-04-12 NOTE — Addendum Note (Signed)
Addendum  created 04/12/17 1320 by Lyndle Herrlich, MD   Sign clinical note

## 2017-04-16 ENCOUNTER — Ambulatory Visit: Payer: Medicare Other | Admitting: Internal Medicine

## 2017-04-26 ENCOUNTER — Ambulatory Visit (INDEPENDENT_AMBULATORY_CARE_PROVIDER_SITE_OTHER): Payer: Medicare Other | Admitting: Internal Medicine

## 2017-04-26 ENCOUNTER — Other Ambulatory Visit: Payer: Medicare Other

## 2017-04-26 ENCOUNTER — Encounter: Payer: Self-pay | Admitting: Internal Medicine

## 2017-04-26 VITALS — BP 104/68 | HR 56 | Ht 61.75 in | Wt 112.0 lb

## 2017-04-26 DIAGNOSIS — J449 Chronic obstructive pulmonary disease, unspecified: Secondary | ICD-10-CM

## 2017-04-26 DIAGNOSIS — I10 Essential (primary) hypertension: Secondary | ICD-10-CM

## 2017-04-26 DIAGNOSIS — F17210 Nicotine dependence, cigarettes, uncomplicated: Secondary | ICD-10-CM | POA: Diagnosis not present

## 2017-04-26 MED ORDER — MOMETASONE FURO-FORMOTEROL FUM 100-5 MCG/ACT IN AERO: 2.0000 | INHALATION_SPRAY | Freq: Two times a day (BID) | RESPIRATORY_TRACT | 0 refills | Status: DC

## 2017-04-26 MED ORDER — MOMETASONE FURO-FORMOTEROL FUM 100-5 MCG/ACT IN AERO: INHALATION_SPRAY | RESPIRATORY_TRACT | 11 refills | Status: AC

## 2017-04-26 MED ORDER — BISOPROLOL FUMARATE 5 MG PO TABS: 5.0000 mg | ORAL_TABLET | Freq: Every day | ORAL | 2 refills | Status: DC

## 2017-04-26 NOTE — Assessment & Plan Note (Signed)
Spirometry 03/05/2017  FEV1 1.39 (56%)  Ratio 76 with mild curvature no rx prior - 03/05/2017    try dulera 100 2bid  > improved while on it  - 04/26/2017  After extensive coaching HFA effectiveness =    75% from a baseline of 50% > continue dulera 100 2bid - Alpha One AT def screen 04/26/2017 >>>   Agree with her husband apparently a homozygote she needs to be screened for carrier status though given how minimal her copd is relative to her smoking hx this seems very unlikely   Main issue is smoking, not choice of meds, so pulmonary f/u is prn   I had an extended discussion with the patient/daughter  reviewing all relevant studies completed to date and  lasting 15 to 20 minutes of a 25 minute visit    Each maintenance medication was reviewed in detail including most importantly the difference between maintenance and prns and under what circumstances the prns are to be triggered using an action plan format that is not reflected in the computer generated alphabetically organized AVS.    Please see AVS for specific instructions unique to this visit that I personally wrote and verbalized to the the pt in detail and then reviewed with pt  by my nurse highlighting any  changes in therapy recommended at today's visit to their plan of care.

## 2017-04-26 NOTE — Assessment & Plan Note (Signed)

## 2017-04-26 NOTE — Patient Instructions (Addendum)
Bisoprolol 5 mg twice daily and stop atenolol and lopressor (metaprolol)    Resume dulera 100 Take 2 puffs first thing in am and then another 2 puffs about 12 hours later.   Work on inhaler technique:  relax and gently blow all the way out then take a nice smooth deep breath back in, triggering the inhaler at same time you start breathing in.  Hold for up to 5 seconds if you can. Blow out thru nose. Rinse and gargle with water when done       Only use your albuterol as a rescue medication to be used if you can't catch your breath by resting or doing a relaxed purse lip breathing pattern.  - The less you use it, the better it will work when you need it. - Ok to use up to 2 puffs  every 4 hours if you must but call for immediate appointment if use goes up over your usual need - Don't leave home without it !!  (think of it like the spare tire for your car)   The key is to stop smoking completely before smoking completely stops you!   .Please remember to go to the lab department downstairs in the basement  for your tests - we will call you with the results when they are available.       If you are satisfied with your treatment plan,  let your doctor know and he/she can either refill your medications or you can return here when your prescription runs out.     If in any way you are not 100% satisfied,  please tell us.  If 100% better, tell your friends!  Pulmonary follow up is as needed

## 2017-04-26 NOTE — Progress Notes (Signed)
Subjective:     [name], female   DOB: [birth year],    MRN: [id number]    Brief patient profile:  74 yowf active smoker with doe x 2000 dx as copd "[facility] at [facility] point but progressed to point where sob whenever lie down assoc with congested cough and no better on Bevespi but better on ventolin so referred to pulmonary clinic 03/05/2017 by Dr   Billey Chang for copd eval but did not meet spirometric criteria for copd on initial encounter     History of Present Illness  03/05/2017 1st [facility] office visit/ [facility]   Chief Complaint  Patient presents with  . Pulmonary Consult    Referred by Dr. Alcide Goodness. Pt states she was dxed with COPD/Emphysema since 2000.  She states her breathing is worse when she lies down flat and also with exertion such as walking from room to room at home.   onset was 2000 doe indolent / progressive  but quite variable best days doe = MMRC1 = can walk nl pace, flat grade, can't hurry or go uphills or steps s sob  Esp p prednisone last rx April  And maybe still needs albuterol maybe once a day around lunch Am mucus x 20 min x sev tbsp yellowish mucus   Not able to lie flat x 2 years wakes up around 1 am and sometimes stays up for hours due to sob but never tries noct ventolin rec Add pepcid 20 mg (over the counter)  at bedtime to see if it helps your night time breathing issues Continue protonix Take 30-60 min before first meal of the day  GERD The key is to stop smoking completely before smoking completely stops you!  dulera 100 Take 2 puffs first thing in am and then another 2 puffs about 12 hours later - if not helping don't fill the prescription Work on inhaler technique:   Only use your albuterol as a rescue medication      04/26/2017  f/u ov/[facility] re:  COPD 0 / AB worse off dulera and still smoking  Chief Complaint  Patient presents with  . Follow-up    Breathing has been worse x 1 wk. She has been out of [facility] for the past wks.   She is coughing more with white to yellow sputum. She is using her albuterol inhaler 2 x per wk on average.    doing better hs and daytime on dulera 100 vs off with much less saba  Turns out husband has alpha one def and daughter wants her checked for the trait  No obvious day to day or daytime variability or assoc   mucus plugs or hemoptysis or cp or chest tightness, subjective wheeze or overt sinus or hb symptoms. No unusual exp hx or h/o childhood pna/ asthma or knowledge of premature birth.  Sleeping ok while on meds (duelra and hs pepcid)  without nocturnal  or early am exacerbation  of respiratory  c/o's or need for noct saba. Also denies any obvious fluctuation of symptoms with weather or environmental changes or other aggravating or alleviating factors except as outlined above   Current Medications, Allergies, Complete Past Medical History, Past Surgical History, Family History, and Social History were reviewed in [employer] record.  ROS  The following are not active complaints unless bolded sore throat, dysphagia, dental problems, itching, sneezing,  nasal congestion or excess/ purulent secretions, ear ache,   fever, chills, sweats, unintended wt  loss, classically pleuritic or exertional cp,  orthopnea pnd or leg swelling, presyncope, palpitations, abdominal pain, anorexia, nausea, vomiting, diarrhea  or change in bowel or bladder habits, change in stools or urine, dysuria,hematuria,  rash, arthralgias, visual complaints, headache, numbness, weakness or ataxia or problems with walking or coordination,  change in mood/affect or memory.                          Objective:   Physical Exam     amb wf nad with rattling sounding coughing and about 10 y older in appearance than stated age    04/26/2017        112   03/05/17 118 lb (53.5 kg)  10/25/16 118 lb (53.5 kg)  10/02/16 118 lb (53.5 kg)    Vital signs reviewed  - Note on arrival 02 sats  97% on RA  and bp 104/68 and pulse 56      HEENT: nl  turbinates bilaterally, and oropharynx. Nl external ear canals without cough reflex - edentulous   NECK :  without JVD/Nodes/TM/ nl carotid upstrokes bilaterally   LUNGS: no acc muscle use,  slt barrel chested  With  minimal insp and exp rhonchi bilaterally    CV:  RRR  no s3 or murmur or increase in P2, and no edema   ABD:  soft and nontender with nl inspiratory excursion in the supine position. No bruits or organomegaly appreciated, bowel sounds nl  MS:  Nl gait/ ext warm without deformities, calf tenderness, cyanosis or clubbing No obvious joint restrictions   SKIN: warm and dry without lesions    NEURO:  alert, approp, nl sensorium with  no motor or cerebellar deficits apparent.    CXR PA and Lateral:   03/05/2017 :    I personally reviewed images and agree with radiology impression as follows:   No active cardiopulmonary disease.   Labs ordered 04/26/2017  Alpha one AT screen     Assessment:

## 2017-04-26 NOTE — Assessment & Plan Note (Addendum)
   Strongly prefer in this setting: Bystolic, the most beta -1  selective Beta blocker available in sample form, with bisoprolol the most selective generic choice  on the market.   Rec:  Change tenormin /opressor to bisoprolol 5 bid  04/26/2017 due to copd/ab and redundancy with BB

## 2017-04-29 LAB — ALPHA-1-ANTITRYPSIN: A-1 Antitrypsin, Ser: 172 mg/dL (ref 83–199)

## 2017-04-30 LAB — ALPHA-1 ANTITRYPSIN PHENOTYPE: A1 ANTITRYPSIN: 176 mg/dL (ref 83–199)

## 2017-05-01 NOTE — Progress Notes (Signed)
ATC, NA and no VM set up yet, [facility]

## 2017-05-02 NOTE — Progress Notes (Signed)
Spoke with pt and notified of results per Dr. Wert. Pt verbalized understanding and denied any questions. 

## 2017-05-08 ENCOUNTER — Encounter: Payer: Self-pay | Admitting: Gastroenterology

## 2017-05-08 ENCOUNTER — Ambulatory Visit (AMBULATORY_SURGERY_CENTER): Payer: Medicare Other | Admitting: Gastroenterology

## 2017-05-08 VITALS — BP 116/55 | HR 52 | Temp 97.8°F | Resp 31 | Ht 61.0 in | Wt 112.0 lb

## 2017-05-08 DIAGNOSIS — R1310 Dysphagia, unspecified: Secondary | ICD-10-CM | POA: Diagnosis not present

## 2017-05-08 DIAGNOSIS — Z86010 Personal history of colonic polyps: Secondary | ICD-10-CM | POA: Diagnosis not present

## 2017-05-08 DIAGNOSIS — D128 Benign neoplasm of rectum: Secondary | ICD-10-CM

## 2017-05-08 DIAGNOSIS — K621 Rectal polyp: Secondary | ICD-10-CM

## 2017-05-08 MED ORDER — SODIUM CHLORIDE 0.9 % IV SOLN: 500.0000 mL | INTRAVENOUS | Status: DC

## 2017-05-08 NOTE — Op Note (Signed)
[place] [facility] Patient Name: [name] Procedure Date: 05/08/2017 2:32 PM MRN: [id number] Endoscopist: Milus Banister , MD Age: 56 Referring MD:  Date of Birth: [birth year] Gender: Female Account #: [id number] Procedure:                Upper GI endoscopy Indications:              Dysphagia Medicines:                Monitored Anesthesia Care Procedure:                Pre-Anesthesia Assessment:                           - Prior to the procedure, a History and Physical                            was performed, and patient medications and                            allergies were reviewed. [name] tolerance of                            previous anesthesia was also reviewed. The risks                            and benefits of the procedure and the sedation                            options and risks were discussed with the patient.                            All questions were answered, and informed consent                            was obtained. Prior Anticoagulants: The patient has                            taken Plavix (clopidogrel), last dose was 5 days                            prior to procedure. ASA Grade Assessment: III - A                            patient with severe systemic disease. After                            reviewing the risks and benefits, the patient was                            deemed in satisfactory condition to undergo the                            procedure.  After obtaining informed consent, the endoscope was                            passed under direct vision. Throughout the                            procedure, the patient's blood pressure, pulse, and                            oxygen saturations were monitored continuously. The                            Endoscope was introduced through the mouth, and                            advanced to the second part of duodenum. The upper   GI endoscopy was accomplished without difficulty.                            The patient tolerated the procedure well. Scope In: Scope Out: Findings:                 The esophagus was normal.                           The stomach was normal.                           The examined duodenum was normal. Complications:            No immediate complications. Estimated blood loss:                            None. Estimated Blood Loss:     Estimated blood loss: none. Impression:               - Normal esophagus.                           - Normal stomach.                           - Normal examined duodenum. Recommendation:           - Patient has a contact number available for                            emergencies. The signs and symptoms of potential                            delayed complications were discussed with the                            patient. Return to normal activities tomorrow.                            Written discharge instructions were provided to the  patient.                           - Resume previous diet. Chew your food well, eat                            slowly and take small bites.                           - Continue present medications. OK to resume your                            plavix today. Milus Banister, MD 05/08/2017 2:51:25 PM This report has been signed electronically.

## 2017-05-08 NOTE — Op Note (Signed)
[place] Patient Name: [name] Procedure Date: 05/08/2017 2:32 PM MRN: [id number] Endoscopist: Milus Banister , MD Age: 56 Referring MD:  Date of Birth: 1961/06/25 Gender: Female Account #: [id number] Procedure:                Flexible Sigmoidoscopy Indications:              High risk colon cancer surveillance: Personal                            history of colonic polyps (10/2016 colonoscopy Dr.                            Ardis Hughs, several polyps removed, one was 63mm                            adenoma in sigmoid that was removed                            piecemeal/cautery) Medicines:                Monitored Anesthesia Care Procedure:                Pre-Anesthesia Assessment:                           - Prior to the procedure, a History and Physical                            was performed, and patient medications and                            allergies were reviewed. [name] tolerance of                            previous anesthesia was also reviewed. The risks                            and benefits of the procedure and the sedation                            options and risks were discussed with the patient.                            All questions were answered, and informed consent                            was obtained. Prior Anticoagulants: The patient has                            taken Plavix (clopidogrel), last dose was 5 days                            prior to procedure. ASA Grade Assessment: III - A  patient with severe systemic disease. After                            reviewing the risks and benefits, the patient was                            deemed in satisfactory condition to undergo the                            procedure.                           After obtaining informed consent, the scope was                            passed under direct vision. The Colonoscope was                            introduced  through the anus and advanced to the the                            descending colon. The flexible sigmoidoscopy was                            accomplished without difficulty. The patient                            tolerated the procedure well. The quality of the                            bowel preparation was fair. Scope In: Scope Out: Findings:                 A 6 mm polyp was found in the rectum. The polyp was                            sessile. The polyp was removed with a cold snare.                            Resection and retrieval were complete.                           The exam was otherwise without abnormality. Complications:            No immediate complications. Estimated blood loss:                            None. Estimated Blood Loss:     Estimated blood loss: none. Impression:               - Preparation of the colon was fair.                           - One 6 mm polyp in the rectum, removed with a cold  snare. Resected and retrieved.                           - The examination was otherwise normal. Recommendation:           - Patient has a contact number available for                            emergencies. The signs and symptoms of potential                            delayed complications were discussed with the                            patient. Return to normal activities tomorrow.                            Written discharge instructions were provided to the                            patient.                           - Resume regular diet.                           - Continue present medications.                           - Await pathology for final recomendations. Milus Banister, MD 05/08/2017 2:45:48 PM This report has been signed electronically.

## 2017-05-09 ENCOUNTER — Telehealth: Payer: Self-pay

## 2017-05-09 DIAGNOSIS — K621 Rectal polyp: Secondary | ICD-10-CM | POA: Diagnosis not present

## 2017-05-09 NOTE — Telephone Encounter (Signed)
  Follow up Call-  Call back number 05/08/2017  Post procedure Call Back phone  # [phone]  Permission to leave phone message Yes  Some recent data might be hidden     Patient questions:  Do you have a fever, pain , or abdominal swelling? No. Pain Score  0 *  Have you tolerated food without any problems? Yes.    Have you been able to return to your normal activities? Yes.    Do you have any questions about your discharge instructions: Diet   No. Medications  No. Follow up visit  No.  Do you have questions or concerns about your Care? No.  Actions: * If pain score is 4 or above: No action needed, pain <4.

## 2017-05-09 NOTE — Patient Instructions (Signed)
YOU HAD AN ENDOSCOPIC PROCEDURE TODAY: Refer to the procedure report and other information in the discharge instructions given to you for any specific questions about what was found during the examination. If this information does not answer your questions, please call [place] office at [phone] to clarify.  ° °YOU SHOULD EXPECT: Some feelings of bloating in the abdomen. Passage of more gas than usual. Walking can help get rid of the air that was put into your GI tract during the procedure and reduce the bloating. If you had a lower endoscopy (such as a colonoscopy or flexible sigmoidoscopy) you may notice spotting of blood in your stool or on the toilet paper. Some abdominal soreness may be present for a day or two, also. ° °DIET: Your first meal following the procedure should be a light meal and then it is ok to progress to your normal diet. A half-sandwich or bowl of soup is an example of a good first meal. Heavy or fried foods are harder to digest and may make you feel nauseous or bloated. Drink plenty of fluids but you should avoid alcoholic beverages for 24 hours. If you had a esophageal dilation, please see attached instructions for diet.   ° °ACTIVITY: Your care partner should take you home directly after the procedure. You should plan to take it easy, moving slowly for the rest of the day. You can resume normal activity the day after the procedure however YOU SHOULD NOT DRIVE, use power tools, machinery or perform tasks that involve climbing or major physical exertion for 24 hours (because of the sedation medicines used during the test).  ° °SYMPTOMS TO REPORT IMMEDIATELY: °A gastroenterologist can be reached at any hour. Please call [phone]  for any of the following symptoms:  °Following lower endoscopy (colonoscopy, flexible sigmoidoscopy) °Excessive amounts of blood in the stool  °Significant tenderness, worsening of abdominal pains  °Swelling of the abdomen that is new, acute  °Fever of 100° or  higher  °Following upper endoscopy (EGD, EUS, ERCP, esophageal dilation) °Vomiting of blood or coffee ground material  °New, significant abdominal pain  °New, significant chest pain or pain under the shoulder blades  °Painful or persistently difficult swallowing  °New shortness of breath  °Black, tarry-looking or red, bloody stools ° °FOLLOW UP:  °If any biopsies were taken you will be contacted by phone or by letter within the next 1-3 weeks. Call [phone]  if you have not heard about the biopsies in 3 weeks.  °Please also call with any specific questions about appointments or follow up tests. ° °

## 2017-05-09 NOTE — Progress Notes (Signed)
Pathology put into chart on 05-09-17 at 716 d/t [employer] being down 05-08-17

## 2017-05-19 ENCOUNTER — Encounter: Payer: Self-pay | Admitting: Gastroenterology

## 2017-06-05 ENCOUNTER — Emergency Department (HOSPITAL_COMMUNITY): Payer: Medicare Other

## 2017-06-05 ENCOUNTER — Encounter (HOSPITAL_COMMUNITY): Payer: Self-pay | Admitting: *Deleted

## 2017-06-05 DIAGNOSIS — Z5321 Procedure and treatment not carried out due to patient leaving prior to being seen by health care provider: Secondary | ICD-10-CM | POA: Diagnosis not present

## 2017-06-05 DIAGNOSIS — R079 Chest pain, unspecified: Secondary | ICD-10-CM | POA: Insufficient documentation

## 2017-06-05 LAB — BASIC METABOLIC PANEL
ANION GAP: 8 (ref 5–15)
BUN: 16 mg/dL (ref 6–20)
CHLORIDE: 99 mmol/L — AB (ref 101–111)
CO2: 27 mmol/L (ref 22–32)
Calcium: 9.7 mg/dL (ref 8.9–10.3)
Creatinine, Ser: 1.51 mg/dL — ABNORMAL HIGH (ref 0.44–1.00)
GFR calc Af Amer: 44 mL/min — ABNORMAL LOW (ref >60)
GFR calc non Af Amer: 38 mL/min — ABNORMAL LOW (ref >60)
GLUCOSE: 68 mg/dL (ref 65–99)
POTASSIUM: 4.5 mmol/L (ref 3.5–5.1)
Sodium: 134 mmol/L — ABNORMAL LOW (ref 135–145)

## 2017-06-05 LAB — CBC
HEMATOCRIT: 35.9 % — AB (ref 36.0–46.0)
HEMOGLOBIN: 11.6 g/dL — AB (ref 12.0–15.0)
MCH: 29.2 pg (ref 26.0–34.0)
MCHC: 32.3 g/dL (ref 30.0–36.0)
MCV: 90.4 fL (ref 78.0–100.0)
Platelets: 402 10*3/uL — ABNORMAL HIGH (ref 150–400)
RBC: 3.97 MIL/uL (ref 3.87–5.11)
RDW: 16.3 % — ABNORMAL HIGH (ref 11.5–15.5)
WBC: 7 10*3/uL (ref 4.0–10.5)

## 2017-06-05 LAB — I-STAT TROPONIN, ED: Troponin i, poc: 0.01 ng/mL (ref 0.00–0.08)

## 2017-06-05 NOTE — ED Triage Notes (Signed)
Pt in c/o CP & SOB & n/v onset x 5 days, pt reports vomiting yesterday morning, pt sent here by PCP for hypotension and eval for CP, pt A&O x4

## 2017-06-06 ENCOUNTER — Emergency Department (HOSPITAL_COMMUNITY)
Admission: EM | Admit: 2017-06-06 | Discharge: 2017-06-06 | Disposition: A | Discharge status: Home / Self Care | Payer: Medicare Other | Attending: Emergency Medicine | Admitting: Emergency Medicine

## 2017-06-06 NOTE — ED Notes (Signed)
Patient called for room x 2, no answer.

## 2017-06-06 NOTE — ED Notes (Signed)
Patient called for VS, no answer. Third time called with no answer.

## 2017-10-27 DIAGNOSIS — I701 Atherosclerosis of renal artery: Secondary | ICD-10-CM | POA: Diagnosis present

## 2017-10-27 NOTE — H&P (Signed)
OFFICE VISIT NOTES COPIED TO EPIC FOR DOCUMENTATION  . History of Present Illness Laverda Page MD; 10/02/2017 5:22 PM) Patient words: Last OV 07/04/2017; test results.  The patient is a 56 year old female who presents for a Follow-up for Peripheral vascular disease.  Additional reasons for visit:  Hypertension is described as the following: Patient with resistant hypertension, had stage IV chronic kidney disease prior to bilateral renal artery stenting, which is resolved and now has stage II to 3 kidney disease and has avoided hemodialysis. Blood pressure also improved since bilateral renal angioplasty. She underwent renal duplex scan for surveillance and presents here for follow-up.  She was diagnosed with orthostatic hypotension in September when I saw her, and I had discontinued amlodipine and metoprolol due to marked orthostasis. Since then symptoms of dizziness is improved. Except for mild dizziness occasionally.  She has a history of COPD, diastolic CHF, ongoing tobacco abuse, and difficult to control hypertension with CKD IV which had resolved with improvement in eGFR from 22 to >50 since bilateral renal angioplasty on 09/07/2014. Underwent renal angiography on 11/01/2015. Underwent scoring balloon angioplasty with a 6.0 x 20 mm Angiosculpt balloon for restenosis on the left renal artery.   Problem List/Past Medical Anderson Malta Sergeant; 10/02/2017 3:32 PM) Schizophrenia (F20.9)  RLS (restless legs syndrome) (G25.81)  Anxiety (F41.9)  Arthritis (M19.90)  IDA (iron deficiency anemia) (D50.9)  Non-rheumatic mitral regurgitation (I34.0)  Dyspnea and respiratory abnormalities (R06.00, R06.89)  Ectopic atrial rhythm (I49.1)  Bipolar 1 disorder (F31.9)  Tobacco use disorder (F17.200)  Now down from 2 packs to 1/2 pack cigarettes a day. Centrilobular emphysema (J43.2)  Renal artery stenosis, native, bilateral (I70.1)  Renal artery duplex 09/18/2017: Hemodynamically  significant stenosis of the right renal artery. Normal intrarenal vascular perfusion is noted in both kidneys. There is increased echogenecity of both kidneys suggests medico-renal disease with right kidney slightly shrunk at 8.03x3.97x4 cm compared to 9.144.664.6 cm Diffuse plaque noted in the abdominal aorta with calcification. Compared to the study done on 01/22/2017, left renal artery stenosis not evident, right renal artery stenosis new. Patient has h/o bilateral renal artery stenting. Bilateral carotid bruits (R09.89)  Carotid artery duplex 05/30/2017: Minimal stenosis in the left internal carotid artery (1-15%). Minimal iblateral heteregenous plaque. Antegrade right vertebral artery flow. Antegrade left vertebral artery flow. F/U studies if clinically indicated. CHF (congestive heart failure), NYHA class II, chronic, diastolic (X90.24)  Echocardiogram 02/26/2017: Left ventricle cavity is normal in size. Normal global wall motion. Normal diastolic filling pattern. Calculated EF 63%. Left atrial cavity is mild to moderately dilated by volume. Borderline prolapse of the posterior MV leaflet with mild anteriorly directed MR. Mild tricuspid regurgitation. No evidence of pulmonary hypertension. Compared to hospital Echocardiogram 07/02/2014: Moderate mitral regurgitation and moderate pulmonary hypertension, PA pressure estimated to be 59 mmHg no longer present. 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 Follow up in one year is appropriate if clinically indicated. Hypovitaminosis D (E55.9)  Labwork  Labs 05/09/2017: Total cholesterol 141, triglycerides 140, HDL 44, LDL 68. Labs 01/21/2017: HB 12.3/HCT 37.0,  platelets 418, WBC 7.0. Normal indicis. TSH normal. Total cholesterol 218, triglycerides 115, HDL 47, LDL 138. Serum glucose 84 mg, BUN 16, creatinine 1.11, eGFR 56 mL, potassium 4.8. CMP otherwise normal. Claudication (I73.9)  Lower extremity  arterial duplex 05/09/2016: No hemodynamically significant stenoses are identified in the lower extremity arterial system. Mild diffuse plaque noted. This exam reveals mildly decreased perfusion of the lower extremity, noted at the post tibial artery level. LABI 0.95 and RABI 0.94 with triphasic waveform (normal). LEFT POP FOSSA-- ? septated bakers cyst measuring 2x2 cm noted. Other iron deficiency anemias (C12.7)  Systolic murmur (N17.0)  Orthostatic hypotension (I95.1)  Atypical chest pain (R07.89)  Sinus bradycardia (R00.1)  EKG 06/06/2017: Sinus bradycardia at 49 bpm, normal axis, normal interval, no evidence of ischemia. Normal EKG.  Allergies Anderson Malta Sergeant; 2017-10-17 3:32 PM) Angiotensin Receptor Blockers  Diovan severe hyperkalemia even at low dose Doxycycline *DERMATOLOGICALS*  Anaphylaxis, Rash. Aspirin *ANALGESICS - NonNarcotic*  internal bleeding HYDROcodone Bitartrate *CHEMICALS*  Itching, Vomiting. Iohexol *DIAGNOSTIC PRODUCTS*   Family History Lolita Lenz; 2017/10/17 3:32 PM) Mother  Deceased. at age 75, from MI ; HEART DISEASE, known seizures, lung disease and colon cancer Father  Deceased. at age 49, from [place], BONE CANCER, DIABETES, HEART DISEASE, prostate cancer Siblings  2 Older Brothers  Social History Anderson Malta Sergeant; 2017/10/17 3:32 PM) Current tobacco use  Current every day smoker. 1/3 ppd (pt uses Nicotine patches also) Non Drinker/No Alcohol Use  Marital status  Divorced. Number of Children  1. Living Situation  Lives with relatives. 1 grandchild lives with her  Past Surgical History Anderson Malta Sergeant; Oct 17, 2017 3:32 PM) PARTIAL HYSTERECTOMY [1996]: Hysterectomy; Abdominal [1997]:  total Colectomy [2009]: 6 inches of colon removed due to obstruction.  Medication History Anderson Malta Sergeant; 17-Oct-2017 3:42 PM) Isosorbide Mononitrate ER (120MG Tablet ER 24HR,  (one half) Ta Tablet ER 24H Oral daily, Taken starting 07/26/2017) Active. Atorvastatin Calcium (80MG Tablet, 1 (one) Table Tabl Table Oral daily, Taken starting 07/26/2017) Active. Chlorthalidone (25MG Tablet, 1 (one) Tablet Oral daily, Taken starting 07/26/2017) Active. Atenolol (50MG Tablet, 1 (one) Tablet Oral daily, Taken starting 07/26/2017) Active. Nicotine (21MG/24HR Patch 24HR, 1 (one) Patch 24HR Patch 24HR Transdermal daily, Taken starting 07/23/2014) Active. Pantoprazole Sodium (40MG Tablet DR, 1 Oral daily) Active. Gabapentin (400MG Tablet, 2 Oral at bedtime) Active. Venlafaxine HCl ER (150MG Tablet ER 24HR, 2 Oral every morning) Active. ALPRAZolam (1MG Tablet, 1 Oral three times daily as needed) Active. Aspirin EC (81MG Tablet DR, 1 Oral daily) Active. (Pt not consistant) Calcium-Vitamin D (1 Oral daily) Specific strength unknown - Active. Dulera (100-5MCG/ACT Aerosol, 2 puffs Inhalation two times daily) Active. Medications Reconciled (verbally with pt)  Diagnostic Studies History Laverda Page, MD; October 17, 2017 5:23 PM) Renal Dopplers [09/18/2017]: Hemodynamically significant stenosis of the right renal artery. Normal intrarenal vascular perfusion is noted in both kidneys. There is increased echogenecity of both kidneys suggests medico-renal disease with right kidney slightly shrunk at 8.03x3.97x4 cm compared to 9.144.664.6 cm Diffuse plaque noted in the abdominal aorta with calcification. Compared to the study done on 01/22/2017, left renal artery stenosis not evident, right renal artery stenosis new. Patient has h/o bilateral renal artery stenting. Renal Angiogram [04/10/2016]: Scoring balloon angioplasty left in-stent restenosis with 6 x 20 mm angiosculpt balloon. Right renal  artery stent widely patent. H/O bilateral renal stenting on 09/07/2014: Right renal artery 6.0 x 15 mm Herculink widely patent, left renal artery 6.0 x 18 mm Herculink stent. Echocardiogram [02/26/2017]: Left ventricle cavity is normal in size. Normal global wall motion. Normal diastolic filling pattern. Calculated EF 63%. Left atrial cavity is mild to moderately dilated by volume. Borderline prolapse of the posterior MV leaflet with mild anteriorly directed MR. Mild tricuspid regurgitation. No evidence  of pulmonary hypertension. Compared to hospital Echocardiogram 07/02/2014: Moderate mitral regurgitation and moderate pulmonary hypertension, PA pressure estimated to be 59 mmHg no longer present. Coronary Angiogram [04/10/2016]: No significant CAD. Normal LVEF.    Review of Systems Laverda Page MD; 10/02/2017 4:35 PM) General Present- Fatigue. Not Present- Fever and Night Sweats. Skin Not Present- Itching and Rash. HEENT Not Present- Headache. Respiratory Present- Chronic Cough, Difficulty Breathing on Exertion and Sputum Production (especially in the morning). Not Present- Hemoptysis and Snoring. Cardiovascular Present- Chest Pain. Not Present- Fainting. Gastrointestinal Present- Abdominal Pain (epigastric), Dysphagia (occasionally. EGD 05/07/2017 normal) and Nausea (for the last few days). Not Present- Constipation, Diarrhea and Vomiting. Neurological Present- Dizziness and Tremor. Not Present- Headaches and Syncope. Hematology Not Present- Blood Clots, Easy Bruising and Nose Bleed.  Vitals Anderson Malta Sergeant; 10/02/2017 3:44 PM) 10/02/2017 3:33 PM Weight: 120.13 lb Height: 61in Body Surface Area: 1.52 m Body Mass Index: 22.7 kg/m  Pulse: 57 (Regular)  P.OX: 97% (Room air) BP: 131/69 (Sitting, Left Arm, Standard)       Physical Exam Laverda Page MD; 10/02/2017 4:33 PM) General Mental Status-Alert. General Appearance-Cooperative, Appears older than stated age,  Not in acute distress. Orientation-Oriented X3. Build & Nutrition-Asthenic and Moderately built.  Head and Neck Thyroid Gland Characteristics - no palpable nodules, no palpable enlargement.  Chest and Lung Exam Palpation Tender - No chest wall tenderness. Auscultation Breath sounds - Bronchovesicular - Both Lung Fields. Adventitious sounds - Expiratory wheeze - Both Lung Fields.  Cardiovascular Inspection Jugular vein - Right - No Distention. Auscultation Heart Sounds - Normal heart sounds. Murmurs & Other Heart Sounds: Murmur - Location - Aortic Area and Tricuspid Area. Timing - Early systolic. Grade - II/VI.  Abdomen Palpation/Percussion Palpation and Percussion of the abdomen reveal - Mild epigastic tenderness present and No hepatosplenomegaly. Auscultation Normal exam - Bowel sounds normal. Abdominal Bruit - Periumbilical.  Peripheral Vascular Lower Extremity Inspection - Bilateral - Not Ischemic, No Pigmentation, no Varicose veins. Palpation - Femoral pulse - Bilateral - 2+ and Bruit. Popliteal pulse - Bilateral - 1+. Dorsalis pedis pulse - Bilateral - 1+. Posterior tibial pulse - Bilateral - 1+. Carotid arteries - Left-Harsh Bruit. Carotid arteries - Right-Soft Bruit. Abdomen-Epigastric bruit present, No prominent abdominal aortic pulsation.  Neurologic Motor-Grossly intact without any focal deficits.  Musculoskeletal Global Assessment Left Lower Extremity - normal range of motion without pain. Right Lower Extremity - normal range of motion without pain.    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 Peak Velocity of 308/66 cm/s. Normal intrarenal vascular perfusion is noted in both  kidneys. There is increased echogenecity of both kidneys suggests medico-renal disease with right kidney slightly shrunk at 8.03x3.97x4 cm compared to 9.144.664.6 cm Diffuse plaque noted in the abdominal aorta with calcification. Compared to the study done on 01/22/2017, left renal artery stenosis not evident, right renal artery stenosis new. Patient has h/o bilateral renal artery stenting. Impression: Renal arteriogram 04/10/2016: Scoring balloon angioplasty left in-stent restenosis with 6 x 20 mm angiosculpt balloon. Right renal artery stent widely patent. H/O bilateral renal stenting on 09/07/2014: Right renal artery 6.0 x 15 mm Herculink widely patent, left renal artery 6.0 x 18 mm Herculink stent.   Tobacco use disorder (F17.200) Story: Now down from 2 packs to 1/2 pack cigarettes a day  and using Nicotine patch Labwork Story: 10/24/2017: Creatinine 1.18, EGFR 52/60, potassium 5.1, BMP normal.  Platelets 417, CBC otherwise normal.  INR 1.0, prothrombin time 10.4.  Labs 05/09/2017: Total cholesterol 141, triglycerides 140, HDL 44, LDL 68.  Labs 01/21/2017: HB 12.3/HCT 37.0, platelets 418, WBC 7.0. Normal indicis. TSH normal. Total cholesterol 218, triglycerides 115, HDL 47, LDL 138. Serum glucose 84 mg, BUN 16, creatinine 1.11, eGFR 56 mL, potassium 4.8. CMP otherwise normal.  Note:- Recommendations:  Patient is here on a three-month office visit and follow-up of renovascular hypertension, recently performed renal duplex clearly indicates restenosis in the right renal artery with high-grade velocity. Unfortunately still continues to smoke, atypical chest pain is probably related to significant GERD with continued tobacco use disorder and the medications. She does have mild epigastric tenderness. No dark stools. Dizziness is mild and stable.  In view of renal artery stenosis, reduced. Kidney size, would recommend proceeding with repeat renal angiogram and possible angioplasty. At the outset  prior to renal angioplasty, patient was close to stage IV chronic kidney disease with uncontrolled hypertension and since renal angioplasty blood pressure has been very well controlled and renal function is remained stable. Ration is out of the risks and benefits of renal angiography and angioplasty including risk of bleeding, perforation, loss of kidney and also development of worsening renal function due to contrast.  CC: Dr. Billey Chang.    Signed by Laverda Page, MD (10/02/2017 5:24 PM)

## 2017-10-29 ENCOUNTER — Ambulatory Visit (HOSPITAL_COMMUNITY)
Admission: RE | Disposition: A | Discharge status: Home / Self Care | Payer: Self-pay | Source: Ambulatory Visit | Attending: Cardiology

## 2017-10-29 ENCOUNTER — Ambulatory Visit (HOSPITAL_COMMUNITY)
Admission: RE | Admit: 2017-10-29 | Discharge: 2017-10-29 | Disposition: A | Discharge status: Home / Self Care | Payer: Medicare Other | Source: Ambulatory Visit | Attending: Cardiology | Admitting: Cardiology

## 2017-10-29 DIAGNOSIS — M1990 Unspecified osteoarthritis, unspecified site: Secondary | ICD-10-CM | POA: Diagnosis not present

## 2017-10-29 DIAGNOSIS — K219 Gastro-esophageal reflux disease without esophagitis: Secondary | ICD-10-CM | POA: Insufficient documentation

## 2017-10-29 DIAGNOSIS — I739 Peripheral vascular disease, unspecified: Secondary | ICD-10-CM | POA: Insufficient documentation

## 2017-10-29 DIAGNOSIS — N184 Chronic kidney disease, stage 4 (severe): Secondary | ICD-10-CM | POA: Diagnosis not present

## 2017-10-29 DIAGNOSIS — E559 Vitamin D deficiency, unspecified: Secondary | ICD-10-CM | POA: Insufficient documentation

## 2017-10-29 DIAGNOSIS — G2581 Restless legs syndrome: Secondary | ICD-10-CM | POA: Diagnosis not present

## 2017-10-29 DIAGNOSIS — I5032 Chronic diastolic (congestive) heart failure: Secondary | ICD-10-CM | POA: Diagnosis not present

## 2017-10-29 DIAGNOSIS — I130 Hypertensive heart and chronic kidney disease with heart failure and stage 1 through stage 4 chronic kidney disease, or unspecified chronic kidney disease: Secondary | ICD-10-CM | POA: Insufficient documentation

## 2017-10-29 DIAGNOSIS — F419 Anxiety disorder, unspecified: Secondary | ICD-10-CM | POA: Diagnosis not present

## 2017-10-29 DIAGNOSIS — F209 Schizophrenia, unspecified: Secondary | ICD-10-CM | POA: Insufficient documentation

## 2017-10-29 DIAGNOSIS — I701 Atherosclerosis of renal artery: Secondary | ICD-10-CM | POA: Diagnosis present

## 2017-10-29 DIAGNOSIS — Z7982 Long term (current) use of aspirin: Secondary | ICD-10-CM | POA: Diagnosis not present

## 2017-10-29 DIAGNOSIS — E7800 Pure hypercholesterolemia, unspecified: Secondary | ICD-10-CM | POA: Insufficient documentation

## 2017-10-29 DIAGNOSIS — F319 Bipolar disorder, unspecified: Secondary | ICD-10-CM | POA: Diagnosis not present

## 2017-10-29 DIAGNOSIS — I6523 Occlusion and stenosis of bilateral carotid arteries: Secondary | ICD-10-CM | POA: Insufficient documentation

## 2017-10-29 DIAGNOSIS — Z8249 Family history of ischemic heart disease and other diseases of the circulatory system: Secondary | ICD-10-CM | POA: Diagnosis not present

## 2017-10-29 DIAGNOSIS — I700 Atherosclerosis of aorta: Secondary | ICD-10-CM | POA: Diagnosis not present

## 2017-10-29 DIAGNOSIS — I951 Orthostatic hypotension: Secondary | ICD-10-CM | POA: Insufficient documentation

## 2017-10-29 DIAGNOSIS — F17210 Nicotine dependence, cigarettes, uncomplicated: Secondary | ICD-10-CM | POA: Insufficient documentation

## 2017-10-29 DIAGNOSIS — I150 Renovascular hypertension: Secondary | ICD-10-CM | POA: Diagnosis present

## 2017-10-29 DIAGNOSIS — R0789 Other chest pain: Secondary | ICD-10-CM | POA: Diagnosis not present

## 2017-10-29 HISTORY — PX: LOWER EXTREMITY ANGIOGRAPHY: CATH118251

## 2017-10-29 HISTORY — PX: PERIPHERAL VASCULAR INTERVENTION: CATH118257

## 2017-10-29 HISTORY — PX: RENAL ANGIOGRAPHY: CATH118260

## 2017-10-29 SURGERY — RENAL ANGIOGRAPHY
Anesthesia: LOCAL

## 2017-10-29 MED ORDER — MIDAZOLAM HCL 2 MG/2ML IJ SOLN
INTRAMUSCULAR | Status: AC
  Filled 2017-10-29: qty 2

## 2017-10-29 MED ORDER — SODIUM CHLORIDE 0.9 % IV SOLN: 250.0000 mL | INTRAVENOUS | Status: DC | PRN

## 2017-10-29 MED ORDER — HEPARIN (PORCINE) IN NACL 2-0.9 UNIT/ML-% IJ SOLN
INTRAMUSCULAR | Status: AC
  Filled 2017-10-29: qty 1000

## 2017-10-29 MED ORDER — MIDAZOLAM HCL 2 MG/2ML IJ SOLN
INTRAMUSCULAR | Status: DC | PRN
  Administered 2017-10-29 (×3): 1 mg via INTRAVENOUS

## 2017-10-29 MED ORDER — SODIUM CHLORIDE 0.9% FLUSH: 3.0000 mL | INTRAVENOUS | Status: DC | PRN

## 2017-10-29 MED ORDER — SODIUM CHLORIDE 0.9% FLUSH: 3.0000 mL | Freq: Two times a day (BID) | INTRAVENOUS | Status: DC

## 2017-10-29 MED ORDER — CLOPIDOGREL BISULFATE 300 MG PO TABS
ORAL_TABLET | ORAL | Status: AC
  Filled 2017-10-29: qty 1

## 2017-10-29 MED ORDER — CLOPIDOGREL BISULFATE 75 MG PO TABS
300.0000 mg | ORAL_TABLET | Freq: Once | ORAL | Status: AC
  Administered 2017-10-29: 300 mg via ORAL

## 2017-10-29 MED ORDER — HEPARIN SODIUM (PORCINE) 1000 UNIT/ML IJ SOLN
INTRAMUSCULAR | Status: DC | PRN
  Administered 2017-10-29: 3000 [IU] via INTRAVENOUS

## 2017-10-29 MED ORDER — HYDRALAZINE HCL 20 MG/ML IJ SOLN: 5.0000 mg | INTRAMUSCULAR | Status: DC | PRN

## 2017-10-29 MED ORDER — METHYLPREDNISOLONE SODIUM SUCC 125 MG IJ SOLR
INTRAMUSCULAR | Status: AC
  Administered 2017-10-29: 125 mg via INTRAVENOUS
  Filled 2017-10-29: qty 2

## 2017-10-29 MED ORDER — DIPHENHYDRAMINE HCL 50 MG/ML IJ SOLN
25.0000 mg | Freq: Once | INTRAMUSCULAR | Status: AC
  Administered 2017-10-29: 25 mg via INTRAVENOUS

## 2017-10-29 MED ORDER — SODIUM CHLORIDE 0.9 % IV SOLN: INTRAVENOUS | Status: DC

## 2017-10-29 MED ORDER — HEPARIN (PORCINE) IN NACL 2-0.9 UNIT/ML-% IJ SOLN
INTRAMUSCULAR | Status: AC | PRN
  Administered 2017-10-29: 1000 mL

## 2017-10-29 MED ORDER — METHYLPREDNISOLONE SODIUM SUCC 125 MG IJ SOLR
125.0000 mg | Freq: Once | INTRAMUSCULAR | Status: AC
  Administered 2017-10-29: 125 mg via INTRAVENOUS

## 2017-10-29 MED ORDER — HEPARIN SODIUM (PORCINE) 1000 UNIT/ML IJ SOLN
INTRAMUSCULAR | Status: AC
  Filled 2017-10-29: qty 1

## 2017-10-29 MED ORDER — LIDOCAINE HCL 1 % IJ SOLN
INTRAMUSCULAR | Status: AC
  Filled 2017-10-29: qty 20

## 2017-10-29 MED ORDER — LIDOCAINE HCL (PF) 1 % IJ SOLN
INTRAMUSCULAR | Status: DC | PRN
  Administered 2017-10-29: 30 mL

## 2017-10-29 MED ORDER — SODIUM CHLORIDE 0.9 % WEIGHT BASED INFUSION: 1.0000 mL/kg/h | INTRAVENOUS | Status: DC

## 2017-10-29 MED ORDER — FENTANYL CITRATE (PF) 100 MCG/2ML IJ SOLN
INTRAMUSCULAR | Status: DC | PRN
  Administered 2017-10-29 (×3): 25 ug via INTRAVENOUS

## 2017-10-29 MED ORDER — ASPIRIN 81 MG PO CHEW
CHEWABLE_TABLET | ORAL | Status: AC
  Administered 2017-10-29: 81 mg
  Filled 2017-10-29: qty 1

## 2017-10-29 MED ORDER — CLOPIDOGREL BISULFATE 75 MG PO TABS: 75.0000 mg | ORAL_TABLET | Freq: Every day | ORAL | 3 refills | Status: AC

## 2017-10-29 MED ORDER — DIPHENHYDRAMINE HCL 50 MG/ML IJ SOLN
INTRAMUSCULAR | Status: AC
  Administered 2017-10-29: 25 mg via INTRAVENOUS
  Filled 2017-10-29: qty 1

## 2017-10-29 MED ORDER — IODIXANOL 320 MG/ML IV SOLN
INTRAVENOUS | Status: DC | PRN
  Administered 2017-10-29: 110 mL via INTRA_ARTERIAL

## 2017-10-29 MED ORDER — FENTANYL CITRATE (PF) 100 MCG/2ML IJ SOLN
INTRAMUSCULAR | Status: AC
  Filled 2017-10-29: qty 2

## 2017-10-29 SURGICAL SUPPLY — 24 items
BALLN MUSTANG 12X20X75 (BALLOONS) ×3
BALLOON MUSTANG 12X20X75 (BALLOONS) IMPLANT
CATH ANGIO 5F BER2 65CM (CATHETERS) ×1 IMPLANT
CATH ANGIO 5F PIGTAIL 65CM (CATHETERS) ×1 IMPLANT
CATH SOFT-VU 4F 65 STRAIGHT (CATHETERS) IMPLANT
CATH SOFT-VU STRAIGHT 4F 65CM (CATHETERS) ×3
DEVICE CLOSURE PERCLS PRGLD 6F (VASCULAR PRODUCTS) IMPLANT
GUIDE CATH VISTA IMA 6F (CATHETERS) ×1 IMPLANT
KIT ENCORE 26 ADVANTAGE (KITS) ×1 IMPLANT
KIT MICROINTRODUCER STIFF 5F (SHEATH) ×1 IMPLANT
KIT PV (KITS) ×3 IMPLANT
PERCLOSE PROGLIDE 6F (VASCULAR PRODUCTS) ×6
SHEATH BRITE TIP 7FR 35CM (SHEATH) ×1 IMPLANT
SHEATH PINNACLE 6F 10CM (SHEATH) ×1 IMPLANT
SHEATH PINNACLE 7F 10CM (SHEATH) ×1 IMPLANT
STENT OMNILINK ELITE 10X29X80 (Permanent Stent) ×2 IMPLANT
STOPCOCK MORSE 400PSI 3WAY (MISCELLANEOUS) ×1 IMPLANT
SYRINGE MEDRAD AVANTA MACH 7 (SYRINGE) ×1 IMPLANT
TAPE VIPERTRACK RADIOPAQ (MISCELLANEOUS) IMPLANT
TAPE VIPERTRACK RADIOPAQUE (MISCELLANEOUS) ×3
TRANSDUCER W/STOPCOCK (MISCELLANEOUS) ×3 IMPLANT
TRAY PV CATH (CUSTOM PROCEDURE TRAY) ×3 IMPLANT
WIRE HITORQ VERSACORE ST 145CM (WIRE) ×1 IMPLANT
WIRE J 3MM .035X145CM (WIRE) ×1 IMPLANT

## 2017-10-29 NOTE — Discharge Instructions (Signed)

## 2017-10-29 NOTE — Interval H&P Note (Signed)
History and Physical Interval Note:  10/29/2017 7:22 AM  [name]  has presented today for surgery, with the diagnosis of renal artery stenosis  The various methods of treatment have been discussed with the patient and family. After consideration of risks, benefits and other options for treatment, the patient has consented to  Procedure(s): RENAL ANGIOGRAPHY (N/A) as a surgical intervention .  [name] history has been reviewed, patient examined, no change in status, stable for surgery.  I have reviewed the patient's chart and labs.  Questions were answered to the patient's satisfaction.     [place]

## 2017-10-29 NOTE — Progress Notes (Signed)
Pt ambulatory in hall tolerated well.  Right groin level zero.  Will continue to monitor

## 2017-10-30 ENCOUNTER — Encounter (HOSPITAL_COMMUNITY): Payer: Self-pay | Admitting: Cardiology

## 2017-10-30 MED FILL — Lidocaine HCl Local Inj 1%: INTRAMUSCULAR | Qty: 20 | Status: AC

## 2017-12-26 DIAGNOSIS — Z9181 History of falling: Secondary | ICD-10-CM | POA: Insufficient documentation

## 2018-01-09 ENCOUNTER — Ambulatory Visit: Payer: Medicare Other | Attending: Family Medicine | Admitting: Physical Therapy

## 2018-03-21 DIAGNOSIS — R9439 Abnormal result of other cardiovascular function study: Secondary | ICD-10-CM

## 2018-03-21 NOTE — Progress Notes (Addendum)
Labs 03/18/2018:  Serum glucose 102 mg, BUN 8, creatinine 0.95, eGFR > 60, potassium 5.6.   HB 9.6/HCT 31.9, Normal indicis.  Platelets elevated at 467.  Patient has had long standing anemia with normal indices and no active bleeding.  12/06/2017: Creatinine 1.13, EGFR 55, potassium 5.9, BMP otherwise normal.

## 2018-03-21 NOTE — H&P (Signed)
[name] 03/12/2018 1:30 PM Location: [facility] Patient #: [id number] DOB: 15-Apr-1961 Divorced / Language: [name] / Race: White Female   History of Present Illness [name]; 03/13/2018 9:56 PM) Patient words: Last O/V 02/05/2018; F/U Nuc, ABI's, Carotid Doppler.  The patient is a 57 year old female who presents for a Follow-up for Hypertension. Patient with resistant hypertension, COPD, diastolic CHF, ongoing tobacco abuse, and difficult to control hypertension with CKD stage IV which has resolved with improvement in EGFR from 22 to greater than 50 since bilateral internal angioplasty on 09/07/2014. Underwent renal angiography on 11/01/2015. Underwent scoring balloon angioplasty with 6.020 mm angiosculpt balloon for restenosis of the left renal artery. Blood pressure is also improved since bilateral renal angioplasty. She underwent renal duplex scan for surveillance that showed restenosis in the right renal artery with high-grade velocities. She underwent repeat renal and abdominal angiogram on 10/29/2017 that showed widely patent renal arteries, but distal abdominal aorta calcification in which she underwent stenting with 10x 29 mm Omnilink Elite stent.  Patient presents for follow up after testing. Due to continued episodes of chest pain that is nitroglycerin responsive, she underwent lexiscan nuclear stress testing. Continues to have claudication symptoms in her left leg and repeat ABI was performed. No new complaints. Continues to smoke 1/2 PPD.   Problem List/Past Medical ([name]; 03/12/2018 1:43 PM) Schizophrenia (F20.9)  RLS (restless legs syndrome) (G25.81)  Anxiety (F41.9)  Arthritis (M19.90)  IDA (iron deficiency anemia) (D50.9)  Non-rheumatic mitral regurgitation (I34.0)  Dyspnea and respiratory abnormalities (R06.00, R06.89)  Ectopic atrial rhythm (I49.1)  Bipolar 1 disorder (F31.9)  Tobacco use disorder (F17.200)  Now down  from 2 packs to 1/2 pack cigarettes a day and using Nicotine patch Centrilobular emphysema (J43.2)  Renal artery stenosis, native, bilateral (I70.1)  Renal artery duplex 09/18/2017: Hemodynamically significant stenosis of the right renal artery. Peak Velocity of 308/66 cm/s. Normal intrarenal vascular perfusion is noted in both kidneys. There is increased echogenecity of both kidneys suggests medico-renal disease with right kidney slightly shrunk at 8.03x3.97x4 cm compared to 9.144.664.6 cm Diffuse plaque noted in the abdominal aorta with calcification. Compared to the study done on 01/22/2017, left renal artery stenosis not evident, right renal artery stenosis new. Patient has h/o bilateral renal artery stenting. Bilateral carotid bruits (R09.89)  Carotid artery duplex 03/06/2018: Stenosis in the left internal carotid artery (16-49%). Bilateral carotids show mild diffuse heterogeneous plaque. Antegrade right vertebral artery flow. Antegrade left vertebral artery flow. Compared to the study done on 05/30/2017, left ICA stenosis slightly worse. Follow up in one year is appropriate if clinically indicated. CHF (congestive heart failure), NYHA class II, chronic, diastolic (X21.19)  Echocardiogram 02/26/2017: Left ventricle cavity is normal in size. Normal global wall motion. Normal diastolic filling pattern. Calculated EF 63%. Left atrial cavity is mild to moderately dilated by volume. Borderline prolapse of the posterior MV leaflet with mild anteriorly directed MR. Mild tricuspid regurgitation. No evidence of pulmonary hypertension. Compared to hospital Echocardiogram 07/02/2014: Moderate mitral regurgitation and moderate pulmonary hypertension, PA pressure estimated to be 59 mmHg no longer present. 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 Follow up in one year is appropriate if clinically indicated. Hypovitaminosis D (E55.9)  Labwork  12/06/2017: Creatinine 1.13, EGFR 55, potassium  5.9, BMP otherwise normal. 10/24/2017: Creatinine 1.18, EGFR 52/60, potassium 5.1, BMP normal. Platelets 417, CBC otherwise normal. INR 1.0, prothrombin time 10.4. Labs 05/09/2017: Total cholesterol 141, triglycerides 140, HDL 44, LDL 68. Labs 01/21/2017: HB 12.3/HCT 37.0, platelets 418, WBC 7.0. Normal indicis. TSH normal. Total cholesterol 218, triglycerides 115, HDL 47, LDL 138. Serum glucose 84 mg, BUN 16, creatinine 1.11, eGFR 56 mL, potassium 4.8. CMP otherwise normal. Claudication (I73.9)  abdominal aortogram 10/29/2017: Widely patent renal arteries. Distal abdominal aorta calcification percent stenosis S/P 10 x 29 mm Omnilink Elite stent, 80% to 0%. Lower extremity arterial duplex 05/09/2016: No hemodynamically significant stenoses are identified in the lower extremity arterial system. Mild diffuse plaque noted. This exam reveals mildly decreased perfusion of the lower extremity, noted at the post tibial artery level. LABI 0.95 and RABI 0.94 with triphasic waveform (normal). LEFT POP FOSSA-- ? septated bakers cyst measuring 2x2 cm noted. Other iron deficiency anemias (G31.5)  Systolic murmur (V76.1)  Orthostatic hypotension (I95.1)  Atypical chest pain (R07.89)  EKG 12/05/2017: Normal sinus rhythm at 73 bpm, normal axis, normal interval, no evidence of ischemia. Normal EKG. Lexiscan myoview stress test 02/17/2018: 1. Lexiscan stress test was performed. Exercise capacity was not assessed. Stress symptoms included dizziness, nausea, headache, and chest pressure.. Peak blood pressure was 176/88 mmHg. Stress EKG is non diagnostic for ischemia as it is a pharmacologic stress. In addition, it showed The stress  electrocardiogram showed sinus tachycardia, normal stress conduction, left ventricular hypertrophy, no stress arrhythmias and normal stress repolarization. 2. The overall quality of the study is excellent. Left ventricular cavity is noted to be normal on the rest and stress studies. Gated SPECT images reveal normal myocardial thickening and wall motion. The left ventricular ejection fraction was calculated or visually estimated to be 78%. SPECT images demonstrate small perfusion abnormality of moderate intensity in the mid anterolateral and apical lateral myocardial wall(s) on the stress images, reversible on rest images. Findings suggest small area of moderate intensity ischemia in mid to apical anterolateral myocardium. 3. Low risk study. Sinus bradycardia (R00.1)  EKG 06/06/2017: Sinus bradycardia at 49 bpm, normal axis, normal interval, no evidence of ischemia. Normal EKG. Right groin pain (R10.31)  Abdominal aortic stenosis (Q25.1)  abdominal aortogram 10/29/2017: Widely patent renal arteries. Distal abdominal aorta calcification percent stenosis S/P 10 x 29 mm Omnilink Elite stent, 80% to 0%.  Allergies ([name]; 04-09-2018 1:43 PM) Angiotensin Receptor Blockers  Diovan severe hyperkalemia even at low dose Doxycycline *DERMATOLOGICALS*  Anaphylaxis, Rash. Aspirin *ANALGESICS - NonNarcotic*  internal bleeding HYDROcodone Bitartrate *CHEMICALS*  Itching, Vomiting. Iohexol *DIAGNOSTIC PRODUCTS*   Family History ([name]; 04/09/2018 1:43 PM) Mother  Deceased. at age 32, from MI ; HEART DISEASE, known seizures, lung disease and colon cancer Father  Deceased. at age 33, from [place], BONE CANCER, DIABETES, HEART DISEASE, prostate cancer Siblings  2 Older Brothers  Social History ([name]; 2018-04-09 1:43 PM) Current tobacco use  Current every day smoker. 1/3 ppd (pt uses Nicotine patches also) Non Drinker/No Alcohol Use  Marital status  Divorced. Number of  Children  1. Living Situation  Lives with relatives. 1 grandchild lives with her  Past Surgical History ([name]; 2018/04/09 1:43 PM) PARTIAL HYSTERECTOMY [1996]: Hysterectomy; Abdominal [1997]: total Colectomy [2009]: 6 inches of colon removed due to obstruction.  Medication History (Cheyenne Louretta Shorten; April 09, 2018 1:49 PM) Pantoprazole Sodium (40MG Tablet DR, 1 Tablet Tablet Oral daily, Taken starting 12/06/2017) Active. Atenolol (25MG Tablet,  Tablet Tablet Oral daily, Taken  starting 12/05/2017) Active. Famotidine (20MG Tablet, 1 Table Tablet Tablet Tab Oral daily, Taken starting 10/25/2017) Active. Isosorbide Mononitrate ER (60MG Tablet ER 24HR, 1 Tablet ER 24HR Tablet ER 24H Oral daily, Taken starting 07/26/2017) Active. Atorvastatin Calcium (80MG Tablet, 1 (one) Table Tabl Table Oral daily, Taken starting 07/26/2017) Active. Nicotine (21MG/24HR Patch 24HR, 1 (one) Patch 24HR Patch 24HR Transdermal daily, Taken starting 07/23/2014) Active. Gabapentin (400MG Tablet, 2 Oral daily) Active. Venlafaxine HCl ER (150MG Tablet ER 24HR, 2 Oral every morning) Active. ALPRAZolam (1MG Tablet, 1 Oral three times daily as needed) Active. Aspirin EC (81MG Tablet DR, 1 Oral daily) Active. (Pt not consistant) Calcium-Vitamin D (1 Oral daily) Specific strength unknown - Active. Dulera (100-5MCG/ACT Aerosol, 2 puffs Inhalation two times daily) Active. Clopidogrel Bisulfate (75MG Tablet, 1 Oral daily) Active. Premarin (0.625MG/GM Cream, Vaginal weekly) Active. ProAir HFA (108 (90 Base)MCG/ACT Aerosol Soln, Inhalation prn) Active. Medications Reconciled (verbally "everything the same")  Diagnostic Studies History ([name]; 04/10/2018 1:45 PM) ABI's  03/06/2018: This exam reveals mildly decreased perfusion of the right lower extremity, noted at the dorsalis pedis artery level (ABI 0.87) and mildly decreased perfusion of the left lower extremity, noted at the post tibial artery  level (ABI 0.84). Moderately abnormal waveforms of the right ankle. Mildly abnormal waveforms of the left ankle. Compared to 05/09/2016, left ABI 0.95 and right ABI 0.94 with normal waveforms. Carotid Doppler  03/06/2018: Stenosis in the left internal carotid artery (16-49%). Bilateral carotids show mild diffuse heterogeneous plaque. Antegrade right vertebral artery flow. Antegrade left vertebral artery flow. Compared to the study done on 05/30/2017, left ICA stenosis slightly worse. Follow up in one year is appropriate if clinically indicated. Nuclear stress test  02/17/2018: 1. Lexiscan stress test was performed. Exercise capacity was not assessed. Stress symptoms included dizziness, nausea, headache, and chest pressure.. Peak blood pressure was 176/88 mmHg. Stress EKG is non diagnostic for ischemia as it is a pharmacologic stress. In addition, it showed The stress electrocardiogram showed sinus tachycardia, normal stress conduction, left ventricular hypertrophy, no stress arrhythmias and normal stress repolarization. 2. The overall quality of the study is excellent. Left ventricular cavity is noted to be normal on the rest and stress studies. Gated SPECT images reveal normal myocardial thickening and wall motion. The left ventricular ejection fraction was calculated or visually estimated to be 78%. SPECT images demonstrate small perfusion abnormality of moderate intensity in the mid anterolateral and apical lateral myocardial wall(s) on the stress images, reversible on rest images. Findings suggest small area of moderate intensity ischemia in mid to apical anterolateral myocardium. 3. Low risk study.    Review of Systems Cheyenne Maine, Collins; 03/13/2018 9:56 PM) General Not Present- Appetite Loss and Weight Gain. Respiratory Not Present- Chronic Cough and Wakes up from Sleep Wheezing or Short of Breath. Cardiovascular Present- Chest Pain (at rest; worsened), Claudications (improved) and  Difficulty Breathing On Exertion (chronic stable). Not Present- Difficulty Breathing Lying Down. Gastrointestinal Present- Nausea. Not Present- Black, Tarry Stool and Difficulty Swallowing. Musculoskeletal Present- Muscle Pain (left leg and left arm along with tingling/numbness). Not Present- Decreased Range of Motion and Muscle Atrophy. Neurological Not Present- Attention Deficit. Psychiatric Not Present- Personality Changes and Suicidal Ideation. Endocrine Not Present- Cold Intolerance and Heat Intolerance. Hematology Not Present- Abnormal Bleeding. All other systems negative  Vitals ([name]; April 10, 2018 1:53 PM) 2018/04/10 1:52 PM Weight: 121.19 lb Height: 61in Body Surface Area: 1.53 m Body Mass Index: 22.9 kg/m  Pulse: 80 (Regular)  P.OX: 95% (Room  air) BP: 145/69 (Standing, Right Arm, Standard)    03/12/2018 1:51 PM Weight: 121.19 lb Height: 61in Body Surface Area: 1.53 m Body Mass Index: 22.9 kg/m  Pulse: 70 (Regular)  P.OX: 97% (Room air) BP: 170/78 (Sitting, Right Arm, Standard)    03/12/2018 1:46 PM Weight: 121.19 lb Height: 61in Body Surface Area: 1.53 m Body Mass Index: 22.9 kg/m  Pulse: 71 (Regular)  P.OX: 98% (Room air) BP: 168/77 (Supine, Right Arm, Standard)       Physical Exam Cheyenne Maine, Collins; 03/13/2018 9:56 PM) General Mental Status-Alert. General Appearance-Cooperative, Appears older than stated age, Not in acute distress. Orientation-Oriented X3. Build & Nutrition-Asthenic and Moderately built.  Chest and Lung Exam Inspection Shape - Barrel-shaped. Auscultation Breath sounds - Prolonged expiration - Both Lung Fields.  Cardiovascular Auscultation Rhythm - Regular. Heart Sounds - Normal heart sounds.  Abdomen Palpation/Percussion Normal exam - Mild epigastic tenderness present and No hepatosplenomegaly. Auscultation Normal exam - Bowel sounds normal. Abdominal bruit -  Periumbilical.  Peripheral Vascular Lower Extremity Inspection - Bilateral - Not Ischemic, No Pigmentation, no Varicose veins. Palpation - Temperature - Bilateral - Warm. Femoral pulse - Bilateral - 2+ and Bruit. Popliteal pulse - Bilateral - 1+. Dorsalis pedis pulse - Bilateral - 1+. Posterior tibial pulse - Bilateral - 1+. Carotid arteries - Left-Harsh Bruit. Carotid arteries - Right-Soft Bruit. Abdomen-Epigastric bruit present, No prominent abdominal aortic pulsation.  Neurologic Motor-Grossly intact without any focal deficits.  Musculoskeletal Global Assessment Left Lower Extremity - normal range of motion without pain. Right Lower Extremity - normal range of motion without pain.   Results [name]; 03/13/2018 10:00 PM) Procedures  Name Value Date Complete duplex scan of bilateral carotid arteries (76226) Comments: Carotid artery duplex 03/06/2018: Stenosis in the left internal carotid artery (16-49%). Bilateral carotids show mild diffuse heterogeneous plaque. Antegrade right vertebral artery flow. Antegrade left vertebral artery flow. Compared to the study done on 05/30/2017, left ICA stenosis slightly worse. Follow up in one year is appropriate if clinically indicated.  Performed: 03/05/2018 3:40 PM CV - Ankle Brachial Index (13.2) Pre Procedure Procedure: Ankle Brachial Index (ABI 03/06/2018: This exam reveals mildly decreased perfusion of the right lower extremity, noted at the dorsalis pedis artery level (ABI 0.87) and mildly decreased perfusion of the left lower extremity, noted at the post tibial artery level (ABI 0.84). Moderately abnormal waveforms of the right ankle. Mildly abnormal waveforms of the left ankle. Compared to 05/09/2016, left ABI 0.95 and right ABI 0.94 with normal waveforms.)  Performed: 03/05/2018 3:40 PM    Assessment & Plan [name]; 03/13/2018 10:00 PM) Abnormal nuclear stress test (R94.39) Atypical chest pain  (R07.89) Story: EKG 12/05/2017: Normal sinus rhythm at 73 bpm, normal axis, normal interval, no evidence of ischemia. Normal EKG.  Lexiscan myoview stress test 02/17/2018: 1. Lexiscan stress test was performed. Exercise capacity was not assessed. Stress symptoms included dizziness, nausea, headache, and chest pressure.. Peak blood pressure was 176/88 mmHg. Stress EKG is non diagnostic for ischemia as it is a pharmacologic stress. In addition, it showed The stress electrocardiogram showed sinus tachycardia, normal stress conduction, left ventricular hypertrophy, no stress arrhythmias and normal stress repolarization. 2. The overall quality of the study is excellent. Left ventricular cavity is noted to be normal on the rest and stress studies. Gated SPECT images reveal normal myocardial thickening and wall motion. The left ventricular ejection fraction was calculated or visually estimated to be 78%. SPECT images demonstrate small perfusion abnormality of  moderate intensity in the mid anterolateral and apical lateral myocardial wall(s) on the stress images, reversible on rest images. Findings suggest small area of moderate intensity ischemia in mid to apical anterolateral myocardium. 3. Intermediate risk study. Current Plans METABOLIC PANEL, BASIC (76720) CBC & PLATELETS (AUTO) (94709) Abdominal aortic stenosis (Q25.1) Story: abdominal aortogram 10/29/2017: Widely patent renal arteries. Distal abdominal aorta calcification percent stenosis S/P 10 x 29 mm Omnilink Elite stent, 80% to 0%. Tobacco use disorder (F17.200) Story: Now down from 2 packs to 1/2 pack cigarettes a day and using Nicotine patch Bilateral carotid bruits (R09.89) Story: Carotid artery duplex 03/06/2018: Stenosis in the left internal carotid artery (16-49%). Bilateral carotids show mild diffuse heterogeneous plaque. Antegrade right vertebral artery flow. Antegrade left vertebral artery flow. Compared to the study done on 05/30/2017,  left ICA stenosis slightly worse. Follow up in one year is appropriate if clinically indicated. Peripheral artery disease (I73.9) Story: ABI 03/06/2018: This exam reveals mildly decreased perfusion of the right lower extremity, noted at the dorsalis pedis artery level (ABI 0.87) and mildly decreased perfusion of the left lower extremity, noted at the post tibial artery level (ABI 0.84). Moderately abnormal waveforms of the right ankle. Mildly abnormal waveforms of the left ankle. Compared to 05/09/2016, left ABI 0.95 and right ABI 0.94 with normal waveforms. Labwork Story: 12/06/2017: Creatinine 1.13, EGFR 55, potassium 5.9, BMP otherwise normal.  10/24/2017: Creatinine 1.18, EGFR 52/60, potassium 5.1, BMP normal. Platelets 417, CBC otherwise normal. INR 1.0, prothrombin time 10.4.  Labs 05/09/2017: Total cholesterol 141, triglycerides 140, HDL 44, LDL 68.  Labs 01/21/2017: HB 12.3/HCT 37.0, platelets 418, WBC 7.0. Normal indicis. TSH normal. Total cholesterol 218, triglycerides 115, HDL 47, LDL 138. Serum glucose 84 mg, BUN 16, creatinine 1.11, eGFR 56 mL, potassium 4.8. CMP otherwise normal.  Note:. Recommendation:  Patient presents for follow-up after testing. She continues to have nitroglycerin responsive chest pain, but do feel that her symptoms are atypical. In view of her history and abnormal stress test, I discussed options of continued aggressive medical management versus coronary angiogram and possible revascularization. Patient wishes to proceed with coronary angiogram. Schedule for cardiac catheterization, and possible angioplasty. We discussed regarding risks, benefits, alternatives to this including stress testing, CTA and continued medical therapy. Patient wants to proceed. Understands <1-2% risk of death, stroke, MI, urgent CABG, bleeding, infection, renal failure but not limited to these.  Has slight progression in peripheral artery disease by recent ABI. She is on appropriate  medical therapy and there is no evidence of ischemic limb. We'll continue to watch clinically and managed with aggressive medical management.  Also has slight worsening and left carotid stenosis, will repeat carotid duplex in 1 year for surveillance.  Extensive discussion with the patient regarding the importance of smoking cessation. I discussed that her continued smoking has led to continued progression of her disease and the risk of continued smoking. She appears motivated to quit and has been able to cut back to half pack per day. She will continue to work to quit smoking with use of nicotine patches. We'll see her back after the procedure for further recommendations and evaluation.  *I have discussed this case with Dr. Virgina Jock and he personally examined the patient and participated in formulating the plan.*  CC: Dr. Bernerd Limbo    Signed by Cheyenne Maine, Collins (03/13/2018 10:01 PM)

## 2018-03-25 ENCOUNTER — Ambulatory Visit (HOSPITAL_COMMUNITY)
Admission: RE | Admit: 2018-03-25 | Discharge: 2018-03-25 | Disposition: A | Discharge status: Home / Self Care | Payer: Medicare Other | Source: Ambulatory Visit | Attending: Cardiology | Admitting: Cardiology

## 2018-03-25 ENCOUNTER — Ambulatory Visit (HOSPITAL_COMMUNITY)
Admission: RE | Disposition: A | Discharge status: Home / Self Care | Payer: Self-pay | Source: Ambulatory Visit | Attending: Cardiology

## 2018-03-25 DIAGNOSIS — I340 Nonrheumatic mitral (valve) insufficiency: Secondary | ICD-10-CM | POA: Insufficient documentation

## 2018-03-25 DIAGNOSIS — G2581 Restless legs syndrome: Secondary | ICD-10-CM | POA: Insufficient documentation

## 2018-03-25 DIAGNOSIS — M1990 Unspecified osteoarthritis, unspecified site: Secondary | ICD-10-CM | POA: Insufficient documentation

## 2018-03-25 DIAGNOSIS — R9439 Abnormal result of other cardiovascular function study: Secondary | ICD-10-CM | POA: Diagnosis not present

## 2018-03-25 DIAGNOSIS — F209 Schizophrenia, unspecified: Secondary | ICD-10-CM | POA: Diagnosis not present

## 2018-03-25 DIAGNOSIS — I110 Hypertensive heart disease with heart failure: Secondary | ICD-10-CM | POA: Diagnosis not present

## 2018-03-25 DIAGNOSIS — E7800 Pure hypercholesterolemia, unspecified: Secondary | ICD-10-CM | POA: Diagnosis not present

## 2018-03-25 DIAGNOSIS — F319 Bipolar disorder, unspecified: Secondary | ICD-10-CM | POA: Diagnosis not present

## 2018-03-25 DIAGNOSIS — R079 Chest pain, unspecified: Secondary | ICD-10-CM | POA: Diagnosis present

## 2018-03-25 DIAGNOSIS — R0789 Other chest pain: Secondary | ICD-10-CM | POA: Diagnosis not present

## 2018-03-25 DIAGNOSIS — K219 Gastro-esophageal reflux disease without esophagitis: Secondary | ICD-10-CM | POA: Diagnosis not present

## 2018-03-25 DIAGNOSIS — E559 Vitamin D deficiency, unspecified: Secondary | ICD-10-CM | POA: Diagnosis not present

## 2018-03-25 DIAGNOSIS — I6523 Occlusion and stenosis of bilateral carotid arteries: Secondary | ICD-10-CM | POA: Insufficient documentation

## 2018-03-25 DIAGNOSIS — Z8249 Family history of ischemic heart disease and other diseases of the circulatory system: Secondary | ICD-10-CM | POA: Insufficient documentation

## 2018-03-25 DIAGNOSIS — I5032 Chronic diastolic (congestive) heart failure: Secondary | ICD-10-CM | POA: Insufficient documentation

## 2018-03-25 DIAGNOSIS — F17210 Nicotine dependence, cigarettes, uncomplicated: Secondary | ICD-10-CM | POA: Diagnosis not present

## 2018-03-25 DIAGNOSIS — I701 Atherosclerosis of renal artery: Secondary | ICD-10-CM | POA: Diagnosis not present

## 2018-03-25 DIAGNOSIS — F419 Anxiety disorder, unspecified: Secondary | ICD-10-CM | POA: Insufficient documentation

## 2018-03-25 HISTORY — PX: LEFT HEART CATH AND CORONARY ANGIOGRAPHY: CATH118249

## 2018-03-25 SURGERY — LEFT HEART CATH AND CORONARY ANGIOGRAPHY
Anesthesia: LOCAL

## 2018-03-25 MED ORDER — DIPHENHYDRAMINE HCL 50 MG/ML IJ SOLN
INTRAMUSCULAR | Status: AC
  Filled 2018-03-25: qty 1

## 2018-03-25 MED ORDER — NITROGLYCERIN 1 MG/10 ML FOR IR/CATH LAB
INTRA_ARTERIAL | Status: AC
  Filled 2018-03-25: qty 10

## 2018-03-25 MED ORDER — HEPARIN (PORCINE) IN NACL 2-0.9 UNITS/ML
INTRAMUSCULAR | Status: AC | PRN
  Administered 2018-03-25 (×2): 500 mL via INTRA_ARTERIAL

## 2018-03-25 MED ORDER — FENTANYL CITRATE (PF) 100 MCG/2ML IJ SOLN
INTRAMUSCULAR | Status: DC | PRN
  Administered 2018-03-25: 25 ug via INTRAVENOUS

## 2018-03-25 MED ORDER — ACETAMINOPHEN 325 MG PO TABS: 650.0000 mg | ORAL_TABLET | ORAL | Status: DC | PRN

## 2018-03-25 MED ORDER — MIDAZOLAM HCL 2 MG/2ML IJ SOLN
INTRAMUSCULAR | Status: AC
Start: 2018-03-25 — End: 2018-03-25
  Filled 2018-03-25: qty 2

## 2018-03-25 MED ORDER — METHYLPREDNISOLONE SODIUM SUCC 125 MG IJ SOLR
INTRAMUSCULAR | Status: DC | PRN
  Administered 2018-03-25: 125 mg via INTRAVENOUS

## 2018-03-25 MED ORDER — IOHEXOL 350 MG/ML SOLN
INTRAVENOUS | Status: DC | PRN
  Administered 2018-03-25: 75 mL via INTRA_ARTERIAL

## 2018-03-25 MED ORDER — FENTANYL CITRATE (PF) 100 MCG/2ML IJ SOLN
INTRAMUSCULAR | Status: AC
  Filled 2018-03-25: qty 2

## 2018-03-25 MED ORDER — SODIUM CHLORIDE 0.9% FLUSH: 3.0000 mL | INTRAVENOUS | Status: DC | PRN

## 2018-03-25 MED ORDER — SODIUM CHLORIDE 0.9 % IV SOLN: 250.0000 mL | INTRAVENOUS | Status: DC | PRN

## 2018-03-25 MED ORDER — SODIUM CHLORIDE 0.9 % IV SOLN: INTRAVENOUS | Status: AC

## 2018-03-25 MED ORDER — SODIUM CHLORIDE 0.9% FLUSH: 3.0000 mL | Freq: Two times a day (BID) | INTRAVENOUS | Status: DC

## 2018-03-25 MED ORDER — METHYLPREDNISOLONE SODIUM SUCC 125 MG IJ SOLR
INTRAMUSCULAR | Status: AC
  Filled 2018-03-25: qty 2

## 2018-03-25 MED ORDER — LIDOCAINE HCL (PF) 1 % IJ SOLN
INTRAMUSCULAR | Status: AC
  Filled 2018-03-25: qty 30

## 2018-03-25 MED ORDER — VERAPAMIL HCL 2.5 MG/ML IV SOLN
INTRAVENOUS | Status: AC
  Filled 2018-03-25: qty 2

## 2018-03-25 MED ORDER — DIPHENHYDRAMINE HCL 50 MG/ML IJ SOLN
INTRAMUSCULAR | Status: DC | PRN
  Administered 2018-03-25: 25 mg via INTRAVENOUS

## 2018-03-25 MED ORDER — ONDANSETRON HCL 4 MG/2ML IJ SOLN: 4.0000 mg | Freq: Four times a day (QID) | INTRAMUSCULAR | Status: DC | PRN

## 2018-03-25 MED ORDER — ASPIRIN 81 MG PO CHEW
81.0000 mg | CHEWABLE_TABLET | ORAL | Status: AC
  Administered 2018-03-25: 81 mg via ORAL

## 2018-03-25 MED ORDER — LIDOCAINE HCL (PF) 1 % IJ SOLN
INTRAMUSCULAR | Status: DC | PRN
  Administered 2018-03-25: 2 mL

## 2018-03-25 MED ORDER — ASPIRIN 81 MG PO CHEW
CHEWABLE_TABLET | ORAL | Status: AC
  Administered 2018-03-25: 81 mg via ORAL
  Filled 2018-03-25: qty 1

## 2018-03-25 MED ORDER — SODIUM CHLORIDE 0.9% FLUSH
3.0000 mL | INTRAVENOUS | Status: DC | PRN
Start: 2018-03-25 — End: 2018-03-25

## 2018-03-25 MED ORDER — SODIUM CHLORIDE 0.9 % IV SOLN
INTRAVENOUS | Status: AC
  Administered 2018-03-25: 11:00:00 via INTRAVENOUS

## 2018-03-25 MED ORDER — MIDAZOLAM HCL 2 MG/2ML IJ SOLN: INTRAMUSCULAR | Status: DC | PRN

## 2018-03-25 MED ORDER — HEPARIN (PORCINE) IN NACL 2-0.9 UNITS/ML
INTRAMUSCULAR | Status: DC | PRN
  Administered 2018-03-25 (×2): 5 mL via INTRA_ARTERIAL

## 2018-03-25 MED ORDER — FAMOTIDINE IN NACL 20-0.9 MG/50ML-% IV SOLN
INTRAVENOUS | Status: AC | PRN
  Administered 2018-03-25: 20 mg via INTRAVENOUS

## 2018-03-25 MED ORDER — HEPARIN (PORCINE) IN NACL 1000-0.9 UT/500ML-% IV SOLN
INTRAVENOUS | Status: AC
  Filled 2018-03-25: qty 1000

## 2018-03-25 MED ORDER — HEPARIN SODIUM (PORCINE) 1000 UNIT/ML IJ SOLN
INTRAMUSCULAR | Status: DC | PRN
  Administered 2018-03-25: 3000 [IU] via INTRAVENOUS

## 2018-03-25 MED ORDER — FAMOTIDINE IN NACL 20-0.9 MG/50ML-% IV SOLN
INTRAVENOUS | Status: AC
  Filled 2018-03-25: qty 50

## 2018-03-25 SURGICAL SUPPLY — 11 items
CATH INFINITI 5 FR AR1 MOD (CATHETERS) ×1 IMPLANT
CATH INFINITI 5 FR JL3.5 (CATHETERS) ×1 IMPLANT
CATH INFINITI JR4 5F (CATHETERS) ×1 IMPLANT
DEVICE RAD COMP TR BAND LRG (VASCULAR PRODUCTS) ×2 IMPLANT
GLIDESHEATH SLEND A-KIT 6F 20G (SHEATH) ×1 IMPLANT
GUIDEWIRE INQWIRE 1.5J.035X260 (WIRE) IMPLANT
INQWIRE 1.5J .035X260CM (WIRE) ×2
KIT HEART LEFT (KITS) ×2 IMPLANT
PACK CARDIAC CATHETERIZATION (CUSTOM PROCEDURE TRAY) ×2 IMPLANT
TRANSDUCER W/STOPCOCK (MISCELLANEOUS) ×2 IMPLANT
TUBING CIL FLEX 10 FLL-RA (TUBING) ×2 IMPLANT

## 2018-03-25 NOTE — Discharge Instructions (Signed)

## 2018-03-25 NOTE — Interval H&P Note (Signed)
History and Physical Interval Note:  03/25/2018 12:28 PM  [name]  has presented today for surgery, with the diagnosis of cp, abnormal stress test  The various methods of treatment have been discussed with the patient and family. After consideration of risks, benefits and other options for treatment, the patient has consented to  Procedure(s): LEFT HEART CATH AND CORONARY ANGIOGRAPHY (N/A) as a surgical intervention .  [name] history has been reviewed, patient examined, no change in status, stable for surgery.  I have reviewed the patient's chart and labs.  Questions were answered to the patient's satisfaction.    2016/2017 Appropriate Use Criteria for Coronary Revascularization Clinical Presentation: Diabetes Mellitus? Symptom Status? S/P CABG? Antianginal Therapy (# of long-acting drugs)? Results of Non-invasive testing? FFR/iFR results in all diseased vessels? Patient undergoing renal transplant? Patient undergoing percutaneous valve procedure (TAVR, MitraClip, Others)? Symptom Status:  Ischemic Symptoms  Non-invasive Testing:  Intermediate Risk  If no or indeterminate stress test, FFR/iFR results in all diseased vessels:  N/A  Diabetes Mellitus:  No  S/P CABG:  No  Antianginal therapy (number of long-acting drugs):  >=2  Patient undergoing renal transplant:  No  Patient undergoing percutaneous valve procedure:  No    newline 1 Vessel Disease PCI CABG  No proximal LAD involvement, No proximal left dominant LCX involvement A (8); Indication 2 M (6); Indication 2   Proximal left dominant LCX involvement A (8); Indication 5 A (8); Indication 5   Proximal LAD involvement A (8); Indication 5 A (8); Indication 5   newline 2 Vessel Disease  No proximal LAD involvement A (8); Indication 8 A (7); Indication 8   Proximal LAD involvement A (8); Indication 11 A (8); Indication 11   newline 3 Vessel Disease  Low disease complexity (e.g., focal stenoses, SYNTAX <=22) A (8);  Indication 17 A (8); Indication 17   Intermediate or high disease complexity (e.g., SYNTAX >=23) M (6); Indication 21 A (9); Indication 21   newline Left Main Disease  Isolated LMCA disease: ostial or midshaft A (7); Indication 24 A (9); Indication 24   Isolated LMCA disease: bifurcation involvement M (6); Indication 25 A (9); Indication 25   LMCA ostial or midshaft, concurrent low disease burden multivessel disease (e.g., 1-2 additional focal stenoses, SYNTAX <=22) A (7); Indication 26 A (9); Indication 26   LMCA ostial or midshaft, concurrent intermediate or high disease burden multivessel disease (e.g., 1-2 additional bifurcation stenoses, long stenoses, SYNTAX >=23) M (4); Indication 27 A (9); Indication 27   LMCA bifurcation involvement, concurrent low disease burden multivessel disease (e.g., 1-2 additional focal stenoses, SYNTAX <=22) M (6); Indication 28 A (9); Indication 28   LMCA bifurcation involvement, concurrent intermediate or high disease burden multivessel disease (e.g., 1-2 additional bifurcation stenoses, long stenoses, SYNTAX >=23) R (3); Indication 29 A (9); Indication [place]

## 2018-03-25 NOTE — Research (Signed)
CADFEM Informed Consent   Subject Name: [name]  Subject met inclusion and exclusion criteria.  The informed consent form, study requirements and expectations were reviewed with the subject and questions and concerns were addressed prior to the signing of the consent form.  The subject verbalized understanding of the trail requirements.  The subject agreed to participate in the CADFEM trial and signed the informed consent.  The informed consent was obtained prior to performance of any protocol-specific procedures for the subject.  A copy of the signed informed consent was given to the subject and a copy was placed in the subject's medical record.  Neva Seat 03/25/2018, 11:07 AM

## 2018-03-26 ENCOUNTER — Encounter (HOSPITAL_COMMUNITY): Payer: Self-pay | Admitting: Cardiology

## 2018-03-26 MED FILL — Heparin Sod (Porcine)-NaCl IV Soln 1000 Unit/500ML-0.9%: INTRAVENOUS | Qty: 1000 | Status: AC

## 2018-03-26 MED FILL — Nitroglycerin IV Soln 100 MCG/ML in D5W: INTRA_ARTERIAL | Qty: 10 | Status: AC

## 2018-03-26 MED FILL — Famotidine in NaCl 0.9% IV Soln 20 MG/50ML: INTRAVENOUS | Qty: 50 | Status: AC

## 2018-10-22 DIAGNOSIS — E7800 Pure hypercholesterolemia, unspecified: Secondary | ICD-10-CM | POA: Insufficient documentation

## 2018-11-19 ENCOUNTER — Encounter (HOSPITAL_COMMUNITY): Payer: Self-pay | Admitting: Emergency Medicine

## 2018-11-19 ENCOUNTER — Ambulatory Visit (HOSPITAL_COMMUNITY)
Admission: EM | Admit: 2018-11-19 | Discharge: 2018-11-19 | Disposition: A | Discharge status: Home / Self Care | Payer: Medicare Other

## 2018-11-19 DIAGNOSIS — L989 Disorder of the skin and subcutaneous tissue, unspecified: Secondary | ICD-10-CM

## 2018-11-19 NOTE — ED Notes (Signed)
Patient able to ambulate independently  

## 2018-11-19 NOTE — Discharge Instructions (Addendum)
I am worried this is skin cancer and you need a biopsy. You need to see dermatology for further work up.  You can also call surgery center to see if they can help with this.

## 2018-11-19 NOTE — ED Provider Notes (Signed)
[facility]    CSN: [id number] Arrival date & time: 11/19/18  0935     History   Chief Complaint Chief Complaint  Patient presents with  . Mass    HPI [name] is a 58 y.o. female.   Patient is a 58 year old female presents with lesion to the right forearm. This has been present and worsening over the past few months. The area has been somewhat itchy and painful. 2 months back she was scratched in that area by a dog and has been cleaning it. No drainage or fever to the area. The area bleeds and then scabs over.  Patient is currently taking Plavix.  she reports that she quit taking her Plavix 2 days ago.   Denies any fever, joint pain. Denies any recent changes in lotions, detergents, foods or other possible irritants. No recent travel. Nobody else at home has the rash. Patient has been outside but denies any contact with plants or insects. No new foods or medications.  She does have a history of skin cancer  ROS per HPI       This voicemail on 2 days ago patient was documented to have some procedure and appointment so she will in the morning Past Medical History:  Diagnosis Date  . Anemia 2008  . Anxiety   . Arthritis    "all my joints ache" (09/07/2014)  . Asthma   . Bipolar 1 disorder ([place])   . Carpal tunnel syndrome   . Cervical cancer ([place]) 1985  . Chronic kidney disease (CKD), stage V ([place])   . COPD (chronic obstructive pulmonary disease) ([place]) 2000  . Coronary artery disease   . Daily headache   . Depression 2000  . Diverticulitis 2008  . GERD (gastroesophageal reflux disease)   . Glaucoma   . Heart murmur   . History of colon polyps 2009  . HLD (hyperlipidemia) 2013  . Hypertension 2013  . IBS (irritable bowel syndrome) 2008  . Myocardial infarction ([place])    2015  . Pancreatitis 10/2011  . Pneumonia 07/2014  . RLS (restless legs syndrome)   . Schizophrenia ([place])   . Shortness of breath   . Small bowel obstruction ([place]) 2008     Patient Active Problem List   Diagnosis Date Noted  . Abnormal stress test 03/21/2018  . Renal artery stenosis, native, bilateral ([place]) 10/27/2017  . Benign neoplasm of transverse colon   . Benign neoplasm of descending colon   . Benign neoplasm of sigmoid colon   . Hypertensive urgency 11/01/2015  . Chest pain 11/01/2015  . 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 Angina pectoris ([place]) 09/06/2014  . Heart failure with preserved ejection fraction ([place]) 07/02/2014  . COPD GOLD 0 / still smoking 07/02/2014  . CKD (chronic kidney disease) stage 3, GFR 30-59 ml/min (HCC) 07/02/2014  . Protein-calorie malnutrition, severe ([place]) 07/02/2014  . Affective bipolar disorder ([place]) 02/02/2009  . ADHESIONS, INTESTINAL W/OBSTRUCTION 08/10/2008  . Irritable bowel syndrome 08/10/2008  . Abdominal pain, generalized 08/10/2008  . FECAL OCCULT BLOOD 08/10/2008  . Anemia, iron deficiency 08/10/2008  . Anxiety state 08/09/2008  . DEPRESSION 08/09/2008  . Essential hypertension 08/09/2008  . ASTHMA 08/09/2008  . ESOPHAGEAL STRICTURE 08/09/2008  . GERD 08/09/2008  . HIATAL HERNIA 08/09/2008  . Diverticulosis of large intestine 08/09/2008  . ARTHRITIS 08/09/2008  . HEADACHE, CHRONIC 08/09/2008  . SKIN CANCER, HX OF 08/09/2008  . COLONIC POLYPS, HYPERPLASTIC, HX OF 08/09/2008  . Cigarette smoker 05/17/2008  .  Schizophrenia ([place]) 04/30/2008    Past Surgical History:  Procedure Laterality Date  . ABDOMINAL ANGIOGRAM N/A 09/07/2014   Procedure: ABDOMINAL ANGIOGRAM;  Surgeon: Laverda Page, MD;  Location: [facility] CATH LAB;  Service: Cardiovascular;  Laterality: N/A;  . APPENDECTOMY  1996  . COLON SURGERY  2008   6 inches of colon removed due to obstruction  . COLONOSCOPY WITH PROPOFOL N/A 10/25/2016   Procedure: COLONOSCOPY WITH PROPOFOL;  Surgeon: Milus Banister, MD;  Location: [facility] ENDOSCOPY;  Service: Endoscopy;  Laterality: N/A;  . CORONARY ANGIOPLASTY WITH STENT PLACEMENT  09/07/2014    "2"  . LEFT HEART CATH AND CORONARY ANGIOGRAPHY N/A 03/25/2018   Procedure: LEFT HEART CATH AND CORONARY ANGIOGRAPHY;  Surgeon: Nigel Mormon, MD;  Location: [place] CV LAB;  Service: Cardiovascular;  Laterality: N/A;  . LEFT HEART CATHETERIZATION WITH CORONARY ANGIOGRAM N/A 09/07/2014   Procedure: LEFT HEART CATHETERIZATION WITH CORONARY ANGIOGRAM;  Surgeon: Laverda Page, MD;  Location: [facility] CATH LAB;  Service: Cardiovascular;  Laterality: N/A;  . LOWER EXTREMITY ANGIOGRAPHY  10/29/2017   Procedure: Lower Extremity Angiography;  Surgeon: Adrian Prows, MD;  Location: [place] CV LAB;  Service: Cardiovascular;;  . PERIPHERAL VASCULAR CATHETERIZATION N/A 11/01/2015   Procedure: Renal Angiography;  Surgeon: Adrian Prows, MD;  Location: [place] CV LAB;  Service: Cardiovascular;  Laterality: N/A;  . PERIPHERAL VASCULAR CATHETERIZATION N/A 04/10/2016   Procedure: Renal Angiography;  Surgeon: Adrian Prows, MD;  Location: [place] CV LAB;  Service: Cardiovascular;  Laterality: N/A;  . PERIPHERAL VASCULAR CATHETERIZATION  04/10/2016   Procedure: Peripheral Vascular Intervention;  Surgeon: Adrian Prows, MD;  Location: [place] CV LAB;  Service: Cardiovascular;;  . PERIPHERAL VASCULAR INTERVENTION  10/29/2017   Procedure: PERIPHERAL VASCULAR INTERVENTION;  Surgeon: Adrian Prows, MD;  Location: [place] CV LAB;  Service: Cardiovascular;;  . RENAL ANGIOGRAPHY N/A 10/29/2017   Procedure: RENAL ANGIOGRAPHY;  Surgeon: Adrian Prows, MD;  Location: [place] CV LAB;  Service: Cardiovascular;  Laterality: N/A;  . RIGHT OOPHORECTOMY Right 1996  . TOTAL ABDOMINAL HYSTERECTOMY  1997    OB History   No obstetric history on file.      Home Medications    Prior to Admission medications   Medication Sig Start Date End Date Taking? Authorizing Provider  acetaminophen (TYLENOL) 500 MG tablet Take 1,000 mg by mouth every 8 (eight) hours as needed for mild pain or headache.   Yes [provider]  albuterol (PROVENTIL HFA;VENTOLIN HFA) 108 (90 BASE) MCG/ACT inhaler Inhale 2 puffs into the lungs 2 (two) times daily as needed (asthma).    Yes [provider]  ALPRAZolam Duanne Moron) 1 MG tablet Take 1 tablet by mouth 2 (two) times daily as needed for anxiety.  04/15/17  Yes [provider]  aspirin EC 81 MG tablet Take 81 mg by mouth daily.   Yes [provider]  atenolol (TENORMIN) 25 MG tablet Take 12.5 mg by mouth daily.   Yes [provider]  atorvastatin (LIPITOR) 80 MG tablet Take 40 mg by mouth daily.    Yes [provider]  Calcium Carb-Cholecalciferol (CALCIUM-VITAMIN D) 600-400 MG-UNIT TABS Take 1 tablet by mouth daily.   Yes [provider]  clopidogrel (PLAVIX) 75 MG tablet Take 75 mg by mouth daily.   Yes [provider]  famotidine (PEPCID) 20 MG tablet Take 1 tablet (20 mg total) by mouth 2 (two) times daily. Patient taking differently: Take 20 mg by mouth daily.  09/08/14  Yes Adrian Prows, MD  gabapentin (NEURONTIN) 400 MG capsule Take 1 capsule (400 mg total) by mouth 2 (two) times daily. Patient takes 400 mg 3 times daily and 1200 mg at bedtime Patient taking differently: Take 400 mg by mouth at bedtime.  07/04/14  Yes Lucious Groves, DO  mometasone-formoterol (DULERA) 100-5 MCG/ACT AERO Take 2 puffs first thing in am and then another 2 puffs about 12 hours later. Patient taking differently: Inhale 2 puffs into the lungs 2 (two) times daily. Take 2 puffs first thing in am and then another 2 puffs about 12 hours later. 04/26/17  Yes Tanda Rockers, MD  Multiple Vitamin (MULTIVITAMIN WITH MINERALS) TABS tablet Take 1 tablet by mouth daily.   Yes [provider]  pantoprazole (PROTONIX) 40 MG tablet Take 40 mg by mouth daily.   Yes [provider]  venlafaxine XR (EFFEXOR XR) 150 MG 24 hr capsule Take 1 capsule (150 mg total) by mouth daily with breakfast. Patient taking differently: Take 300 mg by mouth  daily with breakfast.  11/09/15  Yes Patel-Mills, Orvil Feil, PA-C  isosorbide mononitrate (IMDUR) 60 MG 24 hr tablet Take 60 mg by mouth daily.    [provider]  nitroGLYCERIN (NITROSTAT) 0.4 MG SL tablet Place 0.4 mg under the tongue every 5 (five) minutes as needed for chest pain.    [provider]    Family History Family History  Problem Relation Age of Onset  . Other Mother        many bowel obstructions  . Heart disease Mother   . Kidney cancer Father   . Bone cancer Father   . Diabetes Father   . Heart disease Father   . Diabetes Daughter     Social History Social History   Tobacco Use  . Smoking status: Current Every Day Smoker    Packs/day: 0.75    Years: 40.00    Pack years: 30.00    Types: Cigarettes  . Smokeless tobacco: Never Used  Substance Use Topics  . Alcohol use: No  . Drug use: Yes    Frequency: 3.0 times per week    Types: Marijuana    Comment: last use yesterday     Allergies   Doxycycline; Hydrocodone-acetaminophen; and Iohexol   Review of Systems Review of Systems   Physical Exam Triage Vital Signs ED Triage Vitals  Enc Vitals Group     BP 11/19/18 0959 134/79     Pulse Rate 11/19/18 0959 (!) 54     Resp 11/19/18 0959 16     Temp 11/19/18 0959 97.8 F (36.6 C)     Temp Source 11/19/18 0959 Oral     SpO2 11/19/18 0959 97 %     Weight --      Height --      Head Circumference --      Peak Flow --      Pain Score 11/19/18 1001 7     Pain Loc --      Pain Edu? --      Excl. in [place]? --    No data found.  Updated Vital Signs BP 134/79 (BP Location: Right Arm)   Pulse (!) 54   Temp 97.8 F (36.6 C) (Oral)   Resp 16   SpO2 97%   Visual Acuity Right Eye Distance:   Left Eye Distance:   Bilateral Distance:    Right Eye Near:   Left Eye Near:    Bilateral Near:  Physical Exam Vitals signs and nursing note reviewed.  Constitutional:      Appearance: Normal appearance.  HENT:     Head: Normocephalic  and atraumatic.     Nose: Nose normal.     Mouth/Throat:     Pharynx: Oropharynx is clear.  Neck:     Musculoskeletal: Normal range of motion.  Pulmonary:     Effort: Pulmonary effort is normal.  Musculoskeletal: Normal range of motion.  Skin:    General: Skin is warm and dry.     Findings: Lesion present.     Comments: See picture for detail  Neurological:     Mental Status: She is alert.  Psychiatric:        Mood and Affect: Mood normal.        UC Treatments / Results  Labs (all labs ordered are listed, but only abnormal results are displayed) Labs Reviewed - No data to display  EKG None  Radiology No results found.  Procedures Procedures (including critical care time)  Medications Ordered in UC Medications - No data to display  Initial Impression / Assessment and Plan / UC Course  I have reviewed the triage vital signs and the nursing notes.  Pertinent labs & imaging results that were available during my care of the patient were reviewed by me and considered in my medical decision making (see chart for details).     There is some concern for some sort of cancerous lesion Patient has appointment on March 10 with a dermatologist Spoke with Dr. 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  He agreed to have her come back in tomorrow and have it removed and sent to pathology for testing since she does not want to wait until then.  Pt called and she agreed to plan.  She will return tomorrow.   Final Clinical Impressions(s) / UC Diagnoses   Final diagnoses:  Skin lesions     Discharge Instructions     I am worried this is skin cancer and you need a biopsy. You need to see dermatology for further work up.  You can also call surgery center to see if they can help with this.      ED Prescriptions    None     Controlled Substance Prescriptions [place] Controlled Substance Registry consulted? Not Applicable   Orvan July, NP 11/19/18 1430

## 2018-11-19 NOTE — ED Triage Notes (Signed)
Pt presents to [facility] for assessment of growth on her right forearm x 3 months.  Pt was scratched/punctured by a dog climbing over her.

## 2018-11-20 ENCOUNTER — Other Ambulatory Visit: Payer: Self-pay

## 2018-11-20 ENCOUNTER — Encounter (HOSPITAL_COMMUNITY): Payer: Self-pay | Admitting: Emergency Medicine

## 2018-11-20 ENCOUNTER — Ambulatory Visit (HOSPITAL_COMMUNITY)
Admission: EM | Admit: 2018-11-20 | Discharge: 2018-11-20 | Disposition: A | Discharge status: Home / Self Care | Payer: Medicare Other | Attending: Family Medicine | Admitting: Family Medicine

## 2018-11-20 DIAGNOSIS — L989 Disorder of the skin and subcutaneous tissue, unspecified: Secondary | ICD-10-CM | POA: Insufficient documentation

## 2018-11-20 MED ORDER — TRAMADOL HCL 50 MG PO TABS: 50.0000 mg | ORAL_TABLET | Freq: Two times a day (BID) | ORAL | 0 refills | Status: AC | PRN

## 2018-11-20 MED ORDER — LIDOCAINE-EPINEPHRINE (PF) 2 %-1:200000 IJ SOLN
INTRAMUSCULAR | Status: AC
  Filled 2018-11-20: qty 20

## 2018-11-20 MED ORDER — CLINDAMYCIN HCL 150 MG PO CAPS: 150.0000 mg | ORAL_CAPSULE | Freq: Four times a day (QID) | ORAL | 0 refills | Status: DC

## 2018-11-20 MED ORDER — VARENICLINE TARTRATE 1 MG PO TABS: 1.0000 mg | ORAL_TABLET | Freq: Two times a day (BID) | ORAL | 2 refills | Status: DC

## 2018-11-20 NOTE — ED Provider Notes (Signed)
[place]    CSN: [id number] Arrival date & time: 11/20/18  0901     History   Chief Complaint Chief Complaint  Patient presents with  . Skin Ulcer    HPI [name] is a 58 y.o. female.   HPI  Past Medical History:  Diagnosis Date  . Anemia 2008  . Anxiety   . Arthritis    "all my joints ache" (09/07/2014)  . Asthma   . Bipolar 1 disorder ([place])   . Carpal tunnel syndrome   . Cervical cancer ([place]) 1985  . Chronic kidney disease (CKD), stage V ([place])   . COPD (chronic obstructive pulmonary disease) ([place]) 2000  . Coronary artery disease   . Daily headache   . Depression 2000  . Diverticulitis 2008  . GERD (gastroesophageal reflux disease)   . Glaucoma   . Heart murmur   . History of colon polyps 2009  . HLD (hyperlipidemia) 2013  . Hypertension 2013  . IBS (irritable bowel syndrome) 2008  . Myocardial infarction ([place])    2015  . Pancreatitis 10/2011  . Pneumonia 07/2014  . RLS (restless legs syndrome)   . Schizophrenia ([place])   . Shortness of breath   . Small bowel obstruction ([place]) 2008    Patient Active Problem List   Diagnosis Date Noted  . Abnormal stress test 03/21/2018  . Renal artery stenosis, native, bilateral ([place]) 10/27/2017  . Benign neoplasm of transverse colon   . Benign neoplasm of descending colon   . Benign neoplasm of sigmoid colon   . Hypertensive urgency 11/01/2015  . Chest pain 11/01/2015  . 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 Angina pectoris ([place]) 09/06/2014  . Heart failure with preserved ejection fraction ([place]) 07/02/2014  . COPD GOLD 0 / still smoking 07/02/2014  . CKD (chronic kidney disease) stage 3, GFR 30-59 ml/min (HCC) 07/02/2014  . Protein-calorie malnutrition, severe ([place]) 07/02/2014  . Affective bipolar disorder ([place]) 02/02/2009  . ADHESIONS, INTESTINAL W/OBSTRUCTION 08/10/2008  . Irritable bowel syndrome 08/10/2008  . Abdominal pain, generalized 08/10/2008  . FECAL OCCULT BLOOD  08/10/2008  . Anemia, iron deficiency 08/10/2008  . Anxiety state 08/09/2008  . DEPRESSION 08/09/2008  . Essential hypertension 08/09/2008  . ASTHMA 08/09/2008  . ESOPHAGEAL STRICTURE 08/09/2008  . GERD 08/09/2008  . HIATAL HERNIA 08/09/2008  . Diverticulosis of large intestine 08/09/2008  . ARTHRITIS 08/09/2008  . HEADACHE, CHRONIC 08/09/2008  . SKIN CANCER, HX OF 08/09/2008  . COLONIC POLYPS, HYPERPLASTIC, HX OF 08/09/2008  . Cigarette smoker 05/17/2008  . Schizophrenia ([place]) 04/30/2008    Past Surgical History:  Procedure Laterality Date  . ABDOMINAL ANGIOGRAM N/A 09/07/2014   Procedure: ABDOMINAL ANGIOGRAM;  Surgeon: Laverda Page, MD;  Location: [facility] CATH LAB;  Service: Cardiovascular;  Laterality: N/A;  . APPENDECTOMY  1996  . COLON SURGERY  2008   6 inches of colon removed due to obstruction  . COLONOSCOPY WITH PROPOFOL N/A 10/25/2016   Procedure: COLONOSCOPY WITH PROPOFOL;  Surgeon: Milus Banister, MD;  Location: [facility] ENDOSCOPY;  Service: Endoscopy;  Laterality: N/A;  . CORONARY ANGIOPLASTY WITH STENT PLACEMENT  09/07/2014   "2"  . LEFT HEART CATH AND CORONARY ANGIOGRAPHY N/A 03/25/2018   Procedure: LEFT HEART CATH AND CORONARY ANGIOGRAPHY;  Surgeon: Nigel Mormon, MD;  Location: [place] CV LAB;  Service: Cardiovascular;  Laterality: N/A;  . LEFT HEART CATHETERIZATION WITH CORONARY ANGIOGRAM N/A 09/07/2014   Procedure: LEFT HEART CATHETERIZATION WITH CORONARY ANGIOGRAM;  Surgeon: Turner Daniels  Einar Gip, MD;  Location: [place] CATH LAB;  Service: Cardiovascular;  Laterality: N/A;  . LOWER EXTREMITY ANGIOGRAPHY  10/29/2017   Procedure: Lower Extremity Angiography;  Surgeon: Adrian Prows, MD;  Location: [place] CV LAB;  Service: Cardiovascular;;  . PERIPHERAL VASCULAR CATHETERIZATION N/A 11/01/2015   Procedure: Renal Angiography;  Surgeon: Adrian Prows, MD;  Location: [place] CV LAB;  Service: Cardiovascular;  Laterality: N/A;  . PERIPHERAL VASCULAR CATHETERIZATION N/A  04/10/2016   Procedure: Renal Angiography;  Surgeon: Adrian Prows, MD;  Location: [place] CV LAB;  Service: Cardiovascular;  Laterality: N/A;  . PERIPHERAL VASCULAR CATHETERIZATION  04/10/2016   Procedure: Peripheral Vascular Intervention;  Surgeon: Adrian Prows, MD;  Location: [place] CV LAB;  Service: Cardiovascular;;  . PERIPHERAL VASCULAR INTERVENTION  10/29/2017   Procedure: PERIPHERAL VASCULAR INTERVENTION;  Surgeon: Adrian Prows, MD;  Location: [place] CV LAB;  Service: Cardiovascular;;  . RENAL ANGIOGRAPHY N/A 10/29/2017   Procedure: RENAL ANGIOGRAPHY;  Surgeon: Adrian Prows, MD;  Location: [place] CV LAB;  Service: Cardiovascular;  Laterality: N/A;  . RIGHT OOPHORECTOMY Right 1996  . TOTAL ABDOMINAL HYSTERECTOMY  1997    OB History   No obstetric history on file.      Home Medications    Prior to Admission medications   Medication Sig Start Date End Date Taking? Authorizing Provider  acetaminophen (TYLENOL) 500 MG tablet Take 1,000 mg by mouth every 8 (eight) hours as needed for mild pain or headache.    [provider]  albuterol (PROVENTIL HFA;VENTOLIN HFA) 108 (90 BASE) MCG/ACT inhaler Inhale 2 puffs into the lungs 2 (two) times daily as needed (asthma).     [provider]  ALPRAZolam Duanne Moron) 1 MG tablet Take 1 tablet by mouth 2 (two) times daily as needed for anxiety.  04/15/17   [provider]  aspirin EC 81 MG tablet Take 81 mg by mouth daily.    [provider]  atenolol (TENORMIN) 25 MG tablet Take 12.5 mg by mouth daily.    [provider]  atorvastatin (LIPITOR) 80 MG tablet Take 40 mg by mouth daily.     [provider]  Calcium Carb-Cholecalciferol (CALCIUM-VITAMIN D) 600-400 MG-UNIT TABS Take 1 tablet by mouth daily.    [provider]  clindamycin (CLEOCIN) 150 MG capsule Take 1 capsule (150 mg total) by mouth every 6 (six) hours. 11/20/18   Loura Halt A, NP  clopidogrel (PLAVIX) 75 MG tablet Take  75 mg by mouth daily.    [provider]  famotidine (PEPCID) 20 MG tablet Take 1 tablet (20 mg total) by mouth 2 (two) times daily. Patient taking differently: Take 20 mg by mouth daily.  09/08/14   Adrian Prows, MD  gabapentin (NEURONTIN) 400 MG capsule Take 1 capsule (400 mg total) by mouth 2 (two) times daily. Patient takes 400 mg 3 times daily and 1200 mg at bedtime Patient taking differently: Take 400 mg by mouth at bedtime.  07/04/14   Lucious Groves, DO  isosorbide mononitrate (IMDUR) 60 MG 24 hr tablet Take 60 mg by mouth daily.    [provider]  mometasone-formoterol (DULERA) 100-5 MCG/ACT AERO Take 2 puffs first thing in am and then another 2 puffs about 12 hours later. Patient taking differently: Inhale 2 puffs into the lungs 2 (two) times daily. Take 2 puffs first thing in am and then another 2 puffs about 12 hours later. 04/26/17   Tanda Rockers, MD  Multiple Vitamin (MULTIVITAMIN WITH  MINERALS) TABS tablet Take 1 tablet by mouth daily.    [provider]  nitroGLYCERIN (NITROSTAT) 0.4 MG SL tablet Place 0.4 mg under the tongue every 5 (five) minutes as needed for chest pain.    [provider]  pantoprazole (PROTONIX) 40 MG tablet Take 40 mg by mouth daily.    [provider]  traMADol (ULTRAM) 50 MG tablet Take 1 tablet (50 mg total) by mouth every 12 (twelve) hours as needed for up to 3 days for severe pain. 11/20/18 11/23/18  Loura Halt A, NP  venlafaxine XR (EFFEXOR XR) 150 MG 24 hr capsule Take 1 capsule (150 mg total) by mouth daily with breakfast. Patient taking differently: Take 300 mg by mouth daily with breakfast.  11/09/15   Patel-Mills, Orvil Feil, PA-C    Family History Family History  Problem Relation Age of Onset  . Other Mother        many bowel obstructions  . Heart disease Mother   . Kidney cancer Father   . Bone cancer Father   . Diabetes Father   . Heart disease Father   . Diabetes Daughter     Social History Social  History   Tobacco Use  . Smoking status: Current Every Day Smoker    Packs/day: 0.75    Years: 40.00    Pack years: 30.00    Types: Cigarettes  . Smokeless tobacco: Never Used  Substance Use Topics  . Alcohol use: No  . Drug use: Yes    Frequency: 3.0 times per week    Types: Marijuana    Comment: last use yesterday     Allergies   Doxycycline; Hydrocodone-acetaminophen; and Iohexol   Review of Systems Review of Systems   Physical Exam Triage Vital Signs ED Triage Vitals  Enc Vitals Group     BP 11/20/18 0923 130/84     Pulse Rate 11/20/18 0923 65     Resp 11/20/18 0923 16     Temp 11/20/18 0923 98.4 F (36.9 C)     Temp Source 11/20/18 0923 Oral     SpO2 11/20/18 0923 96 %     Weight --      Height --      Head Circumference --      Peak Flow --      Pain Score 11/20/18 0920 8     Pain Loc --      Pain Edu? --      Excl. in [place]? --    No data found.  Updated Vital Signs BP 130/84   Pulse 65   Temp 98.4 F (36.9 C) (Oral)   Resp 16   SpO2 96%   Visual Acuity Right Eye Distance:   Left Eye Distance:   Bilateral Distance:    Right Eye Near:   Left Eye Near:    Bilateral Near:     Physical Exam   UC Treatments / Results  Labs (all labs ordered are listed, but only abnormal results are displayed) Labs Reviewed - No data to display  EKG None  Radiology No results found.  Procedures Laceration Repair Date/Time: 11/20/2018 10:27 AM Performed by: Robyn Haber, MD Authorized by: Robyn Haber, MD   Consent:    Consent obtained:  Verbal   Consent given by:  Patient   Risks discussed:  Infection, need for additional repair and poor cosmetic result   Alternatives discussed:  No treatment Anesthesia (see MAR for exact dosages):    Anesthesia method:  Local  infiltration   Local anesthetic:  Lidocaine 2% WITH epi Laceration details:    Location:  Shoulder/arm   Shoulder/arm location:  R lower arm   Length (cm):  5   Depth (mm):   5 Repair type:    Repair type:  Complex Pre-procedure details:    Preparation:  Patient was prepped and draped in usual sterile fashion Exploration:    Limited defect created (wound extended): no     Hemostasis achieved with:  Direct pressure   Wound exploration: wound explored through full range of motion     Contaminated: no   Treatment:    Area cleansed with:  Shur-Clens   Amount of cleaning:  Standard   Visualized foreign bodies/material removed: no     Debridement:  Moderate   Undermining:  Minimal   Scar revision: no   Skin repair:    Repair method:  Sutures   Suture size:  4-0   Suture material:  Prolene   Suture technique:  Vertical mattress   Number of sutures:  10 Approximation:    Approximation:  Close Post-procedure details:    Dressing:  Bulky dressing   Patient tolerance of procedure:  Tolerated well, no immediate complications   (including critical care time)  Medications Ordered in UC Medications - No data to display  Initial Impression / Assessment and Plan / UC Course  I have reviewed the triage vital signs and the nursing notes.  Pertinent labs & imaging results that were available during my care of the patient were reviewed by me and considered in my medical decision making (see chart for details).    Final Clinical Impressions(s) / UC Diagnoses   Final diagnoses:  Encounter for removal of skin lesion     Discharge Instructions     We have remove the lesion from your arm.  You will need to return 10 days for suture removal I am prescribing some antibiotics I will give you a few tramadol to help with pain for the next couple of days We will send the lesion for biopsy and call you with the results    ED Prescriptions    Medication Sig Dispense Auth. Provider   traMADol (ULTRAM) 50 MG tablet Take 1 tablet (50 mg total) by mouth every 12 (twelve) hours as needed for up to 3 days for severe pain. 6 tablet Bast, Traci A, NP   clindamycin  (CLEOCIN) 150 MG capsule Take 1 capsule (150 mg total) by mouth every 6 (six) hours. 28 capsule Loura Halt A, NP     Controlled Substance Prescriptions [place] Controlled Substance Registry consulted? Not Applicable   Robyn Haber, MD 11/20/18 1032

## 2018-11-20 NOTE — Discharge Instructions (Signed)
We have remove the lesion from your arm.  You will need to return 10 days for suture removal I am prescribing some antibiotics I will give you a few tramadol to help with pain for the next couple of days We will send the lesion for biopsy and call you with the results

## 2018-11-20 NOTE — ED Triage Notes (Signed)
PT has a lesion on right forearm that has been present for 2 months.

## 2018-11-26 ENCOUNTER — Ambulatory Visit (INDEPENDENT_AMBULATORY_CARE_PROVIDER_SITE_OTHER): Payer: Medicare Other | Admitting: Cardiology

## 2018-11-26 ENCOUNTER — Encounter: Payer: Self-pay | Admitting: Cardiology

## 2018-11-26 VITALS — BP 132/68 | HR 53 | Ht 61.0 in | Wt 123.0 lb

## 2018-11-26 DIAGNOSIS — M5440 Lumbago with sciatica, unspecified side: Secondary | ICD-10-CM

## 2018-11-26 DIAGNOSIS — I10 Essential (primary) hypertension: Secondary | ICD-10-CM

## 2018-11-26 DIAGNOSIS — G8929 Other chronic pain: Secondary | ICD-10-CM

## 2018-11-26 DIAGNOSIS — Q251 Coarctation of aorta: Secondary | ICD-10-CM | POA: Diagnosis not present

## 2018-11-26 DIAGNOSIS — I739 Peripheral vascular disease, unspecified: Secondary | ICD-10-CM

## 2018-11-26 DIAGNOSIS — F17200 Nicotine dependence, unspecified, uncomplicated: Secondary | ICD-10-CM

## 2018-11-26 NOTE — Progress Notes (Signed)
Subjective:   [name], female    DOB: [birth year], 58 y.o.   MRN: [id number]  Bernerd Limbo, MD:  Chief Complaint  Patient presents with  . Hypertension    pt c/o leg pain    HPI: [name]  is a 58 y.o. female  with  hypertension, COPD, diastolic CHF, ongoing tobacco abuse, and difficult to control hypertension with CKD, with bilateral intrarenal angioplasty in 2015 and again underwent renal angiography in 2017 that showed  restenosis of the left renal artery and underwent scoring balloon angioplasty with 6.020 mm angiosculpt balloon.She underwent renal duplex scan for surveillance that showed restenosis in the right renal artery with high-grade velocities. She underwent repeat renal and abdominal angiogram on 10/29/2017 that showed widely patent renal arteries, but distal abdominal aorta stenosis with diffuse disease, calcification was present for which she underwent stenting with 10x 29 mm Omnilink Elite stent.  Due to continued symptoms of chest pain that was nitroglycerin responsive, underwent coronary angiogram on 03/25/2018 that revealed normal coronary arteries. She continues to have occasional chest pain, but now feels that this is related to her back pain. Continues to smoke 1/2 PPD.  She is here on 3 month follow up. She has now had right arm ulceration removal and was told last week that biopsy confirmed skin cancer.. She is to see Dermatology in March. She has another spot on her right forearm that she feels is also cancer.   Today, she is complaining of left leg pain that starts in her hip and radiates down her leg associated with exertion, but does not occur each time she exerts herself, but does feel this is worsening. She also has chronic back pain. She has been told to have severe CKD; however, on last check, kidney function is normal.     Past Medical History:  Diagnosis Date  . Anemia 2008  . Anxiety   . Aortic stenosis   . Arthritis    "all  my joints ache" (09/07/2014)  . Asthma   . Bipolar 1 disorder ([place])   . Bradycardia   . Bruit   . Carpal tunnel syndrome   . Cervical cancer ([place]) 1985  . CHF (congestive heart failure) ([place])   . Chronic kidney disease (CKD), stage V ([place])   . COPD (chronic obstructive pulmonary disease) ([place]) 2000  . Coronary artery disease   . Daily headache   . Depression 2000  . Diverticulitis 2008  . GERD (gastroesophageal reflux disease)   . GERD (gastroesophageal reflux disease)   . Glaucoma   . Heart murmur   . History of colon polyps 2009  . HLD (hyperlipidemia) 2013  . Hypertension 2013  . Hypovitaminosis D   . IBS (irritable bowel syndrome) 2008  . Myocardial infarction ([place])    2015  . PAD (peripheral artery disease) ([place])   . Pancreatitis 10/2011  . Pneumonia 07/2014  . RLS (restless legs syndrome)   . Schizophrenia ([place])   . Shortness of breath   . Skin cancer   . Small bowel obstruction ([place]) 2008    Past Surgical History:  Procedure Laterality Date  . ABDOMINAL ANGIOGRAM N/A 09/07/2014   Procedure: ABDOMINAL ANGIOGRAM;  Surgeon: Laverda Page, MD;  [facility] CATH LAB;  Service: Cardiovascular;  Laterality: N/A;  . APPENDECTOMY  1996  . COLON SURGERY  2008   6 inches of colon removed due to obstruction  . COLONOSCOPY WITH PROPOFOL N/A 10/25/2016   Procedure:  COLONOSCOPY WITH PROPOFOL;  Surgeon: Milus Banister, MD;  Location: [facility] ENDOSCOPY;  Service: Endoscopy;  Laterality: N/A;  . CORONARY ANGIOPLASTY WITH STENT PLACEMENT  09/07/2014   "2"  . LEFT HEART CATH AND CORONARY ANGIOGRAPHY N/A 03/25/2018   Procedure: LEFT HEART CATH AND CORONARY ANGIOGRAPHY;  Surgeon: Nigel Mormon, MD;  Location: [place] CV LAB;  Service: Cardiovascular;  Laterality: N/A;  . LEFT HEART CATHETERIZATION WITH CORONARY ANGIOGRAM N/A 09/07/2014   Procedure: LEFT HEART CATHETERIZATION WITH CORONARY ANGIOGRAM;  Surgeon: Laverda Page, MD;  Location: [facility] CATH LAB;  Service:  Cardiovascular;  Laterality: N/A;  . LOWER EXTREMITY ANGIOGRAPHY  10/29/2017   Procedure: Lower Extremity Angiography;  Surgeon: Adrian Prows, MD;  Location: [place] CV LAB;  Service: Cardiovascular;;  . PERIPHERAL VASCULAR CATHETERIZATION N/A 11/01/2015   Procedure: Renal Angiography;  Surgeon: Adrian Prows, MD;  Location: [place] CV LAB;  Service: Cardiovascular;  Laterality: N/A;  . PERIPHERAL VASCULAR CATHETERIZATION N/A 04/10/2016   Procedure: Renal Angiography;  Surgeon: Adrian Prows, MD;  Location: [place] CV LAB;  Service: Cardiovascular;  Laterality: N/A;  . PERIPHERAL VASCULAR CATHETERIZATION  04/10/2016   Procedure: Peripheral Vascular Intervention;  Surgeon: Adrian Prows, MD;  Location: [place] CV LAB;  Service: Cardiovascular;;  . PERIPHERAL VASCULAR INTERVENTION  10/29/2017   Procedure: PERIPHERAL VASCULAR INTERVENTION;  Surgeon: Adrian Prows, MD;  Location: [place] CV LAB;  Service: Cardiovascular;;  . RENAL ANGIOGRAPHY N/A 10/29/2017   Procedure: RENAL ANGIOGRAPHY;  Surgeon: Adrian Prows, MD;  Location: [place] CV LAB;  Service: Cardiovascular;  Laterality: N/A;  . RIGHT OOPHORECTOMY Right 1996  . TOTAL ABDOMINAL HYSTERECTOMY  1997    Family History  Problem Relation Age of Onset  . Other Mother        many bowel obstructions  . Heart disease Mother   . Kidney cancer Father   . Bone cancer Father   . Diabetes Father   . Heart disease Father   . Diabetes Daughter     Social History   Socioeconomic History  . Marital status: Divorced    Spouse name: Not on file  . Number of children: 1  . Years of education: Not on file  . Highest education level: Not on file  Occupational History  . Occupation: Scientist, research (life sciences): UNEMPLOYED  Social Needs  . Financial resource strain: Not on file  . Food insecurity:    Worry: Not on file    Inability: Not on file  . Transportation needs:    Medical: Not on file    Non-medical: Not on file  Tobacco Use  .  Smoking status: Current Every Day Smoker    Packs/day: 0.75    Years: 40.00    Pack years: 30.00    Types: Cigarettes  . Smokeless tobacco: Never Used  Substance and Sexual Activity  . Alcohol use: No  . Drug use: Yes    Frequency: 3.0 times per week    Types: Marijuana    Comment: last use yesterday  . Sexual activity: Yes    Birth control/protection: Post-menopausal  Lifestyle  . Physical activity:    Days per week: Not on file    Minutes per session: Not on file  . Stress: Not on file  Relationships  . Social connections:    Talks on phone: Not on file    Gets together: Not on file    Attends religious service: Not on file    Active member  of club or organization: Not on file    Attends meetings of clubs or organizations: Not on file    Relationship status: Not on file  . Intimate partner violence:    Fear of current or ex partner: Not on file    Emotionally abused: Not on file    Physically abused: Not on file    Forced sexual activity: Not on file  Other Topics Concern  . Not on file  Social History Narrative  . Not on file    Current Meds  Medication Sig  . acetaminophen (TYLENOL) 500 MG tablet Take 1,000 mg by mouth every 8 (eight) hours as needed for mild pain or headache.  . albuterol (PROVENTIL HFA;VENTOLIN HFA) 108 (90 BASE) MCG/ACT inhaler Inhale 2 puffs into the lungs 2 (two) times daily as needed (asthma).   . ALPRAZolam (XANAX) 1 MG tablet Take 1 tablet by mouth 2 (two) times daily as needed for anxiety.   Marland Kitchen aspirin EC 81 MG tablet Take 81 mg by mouth daily.  Marland Kitchen atenolol (TENORMIN) 25 MG tablet Take 12.5 mg by mouth daily.  Marland Kitchen atorvastatin (LIPITOR) 80 MG tablet Take 80 mg by mouth daily.   . Calcium Carb-Cholecalciferol (CALCIUM-VITAMIN D) 600-400 MG-UNIT TABS Take 1 tablet by mouth daily.  . clindamycin (CLEOCIN) 150 MG capsule Take 1 capsule (150 mg total) by mouth every 6 (six) hours.  . clopidogrel (PLAVIX) 75 MG tablet Take 75 mg by mouth daily.  Marland Kitchen  conjugated estrogens (PREMARIN) vaginal cream Place vaginally.  . famotidine (PEPCID) 20 MG tablet Take 1 tablet (20 mg total) by mouth 2 (two) times daily. (Patient taking differently: Take 20 mg by mouth daily. )  . gabapentin (NEURONTIN) 400 MG capsule Take 1 capsule (400 mg total) by mouth 2 (two) times daily. Patient takes 400 mg 3 times daily and 1200 mg at bedtime (Patient taking differently: Take 400 mg by mouth at bedtime. )  . mometasone-formoterol (DULERA) 100-5 MCG/ACT AERO Take 2 puffs first thing in am and then another 2 puffs about 12 hours later. (Patient taking differently: Inhale 2 puffs into the lungs 2 (two) times daily. Take 2 puffs first thing in am and then another 2 puffs about 12 hours later.)  . Multiple Vitamin (MULTIVITAMIN WITH MINERALS) TABS tablet Take 1 tablet by mouth daily.  . nitroGLYCERIN (NITROSTAT) 0.4 MG SL tablet Place 0.4 mg under the tongue every 5 (five) minutes as needed for chest pain.  . pantoprazole (PROTONIX) 40 MG tablet Take 40 mg by mouth daily.  . varenicline (CHANTIX CONTINUING MONTH PAK) 1 MG tablet Take 1 tablet (1 mg total) by mouth 2 (two) times daily.  Marland Kitchen venlafaxine XR (EFFEXOR XR) 150 MG 24 hr capsule Take 1 capsule (150 mg total) by mouth daily with breakfast. (Patient taking differently: Take 300 mg by mouth daily with breakfast. )     Review of Systems  Constitution: Negative for decreased appetite, malaise/fatigue, weight gain and weight loss.  Eyes: Negative for visual disturbance.  Cardiovascular: Positive for chest pain, claudication and dyspnea on exertion. Negative for leg swelling, orthopnea, palpitations and syncope.  Respiratory: Negative for hemoptysis and wheezing.   Endocrine: Negative for cold intolerance and heat intolerance.  Hematologic/Lymphatic: Does not bruise/bleed easily.  Skin: Negative for nail changes.  Musculoskeletal: Negative for muscle weakness and myalgias.  Gastrointestinal: Negative for abdominal pain,  change in bowel habit, nausea and vomiting.  Neurological: Negative for difficulty with concentration, dizziness, focal weakness and headaches.  Psychiatric/Behavioral:  Negative for altered mental status and suicidal ideas.  All other systems reviewed and are negative.      Objective:     Blood pressure 132/68, pulse (!) 53, height 5\' 1"  (1.549 m), weight 123 lb (55.8 kg), SpO2 97 %.  Echocardiogram 02/26/2017: Left ventricle cavity is normal in size. Normal global wall motion. Normal diastolic filling pattern. Calculated EF 63%. Left atrial cavity is mild to moderately dilated by volume. Borderline prolapse of the posterior MV leaflet with mild anteriorly directed MR. Mild tricuspid regurgitation. No evidence of pulmonary hypertension. Compared to hospital Echocardiogram 07/02/2014: Moderate mitral regurgitation and moderate pulmonary hypertension, PA pressure estimated to be 59 mmHg no longer present.   Lexiscan myoview stress test 02/17/2018: 1. Lexiscan stress test was performed. Exercise capacity was not assessed. Stress symptoms included dizziness, nausea, headache, and chest pressure.. Peak blood pressure was 176/88 mmHg. Stress EKG is non diagnostic for ischemia as it is a pharmacologic stress. In addition, it showed The stress electrocardiogram showed sinus tachycardia, normal stress conduction, left ventricular hypertrophy, no stress arrhythmias and normal stress repolarization. 2. The overall quality of the study is excellent. Left ventricular cavity is noted to be normal on the rest and stress studies. Gated SPECT images reveal normal myocardial thickening and wall motion. The left ventricular ejection fraction was calculated or visually estimated to be 78%. SPECT images demonstrate small perfusion abnormality of moderate intensity in the mid anterolateral and apical lateral myocardial wall(s) on the stress images, reversible on rest images. Findings suggest small area of moderate  intensity ischemia in mid to apical anterolateral myocardium. 3. Intermediate risk study.  03/25/2018-cardiac cath.-normal coronary arteries.  abdominal aortogram 10/29/2017: Widely patent renal arteries. Distal abdominal aorta diffuse disease, calcification with 80% stenosis S/P 10 x 29 mm Omnilink Elite stent, 80% to 0%.  Renal arteriogram 02/29/2018: Widely patient stents. 04/10/2016: Scoring balloon angioplasty left in-stent restenosis with 6 x 20 mm angiosculpt balloon.  Right renal artery stent widely patent. H/O bilateral renal stenting on 09/07/2014: Right renal artery 6.0 x 15 mm Herculink widely patent, left renal artery  6.0 x 18 mm Herculink stent.  Renal artery duplex 09/18/2017: Hemodynamically significant stenosis of the right renal artery. Peak Velocity of 308/66 cm/s. Normal intrarenal vascular perfusion is noted in both kidneys. There is increased echogenecity of both kidneys suggests medico-renal disease with right kidney slightly shrunk at 8.03x3.97x4 cm compared to 9.144.664.6 cm Diffuse plaque noted in the abdominal aorta with calcification.  Compared to the study done on 01/22/2017, left renal artery stenosis not evident, right renal artery stenosis new.  Patient has h/o bilateral renal artery stenting.  Physical Exam  Constitutional: She is oriented to person, place, and time. Vital signs are normal. She appears well-developed and well-nourished.  HENT:  Head: Normocephalic and atraumatic.  Neck: Normal range of motion.  Cardiovascular: Normal rate, regular rhythm, normal heart sounds and intact distal pulses.  Pulses:      Carotid pulses are on the right side with bruit and on the left side with bruit.      Femoral pulses are 2+ on the right side with bruit and 2+ on the left side with bruit.      Popliteal pulses are 1+ on the right side and 1+ on the left side.       Dorsalis pedis pulses are 1+ on the right side and 1+ on the left side.       Posterior tibial  pulses are 1+ on the right side  and 1+ on the left side.  Pulmonary/Chest: Effort normal and breath sounds normal. No accessory muscle usage. No respiratory distress.  Prolonged expiration, barrel shaped chest  Abdominal: Soft. Bowel sounds are normal. She exhibits abdominal bruit.  Musculoskeletal: Normal range of motion.  Neurological: She is alert and oriented to person, place, and time.  Skin: Skin is warm and dry.  Vitals reviewed.          Assessment & Recommendations:   1. Claudication [facility]) Patient has symptoms of claudication particularly in her left leg. Vascular exam is unchanged, no evidence of limb ischemia. Although her symptoms may be neurogenic from back pain, given her history, will further evaluate with bilateral ABI.  2. Essential hypertension Has generally been well controlled, and actually have decreased her dose of atenolol down to in the past. Will reevaluate at her next office visit.  3. Abdominal aortic stenosis On plavix and ASA therapy. Consider [place] for surveillance, will discuss at her next OV.  4. Chronic low back pain with sciatica, sciatica laterality unspecified, unspecified back pain laterality Suspect may be etiology for her claudication symptoms. She is on Gabapentin. Could consider Lyrica if she continues to have symptoms with Gabapentin.   5. Tobacco use disorder Unfortunately, she continues to smoke despite multiple conversations about the importance of quitting. She states that she is under to much stress right now in finding about skin cancer to work to quit right now. Will reevaluate her readiness and willingness at next OV.   I will see her back after the ABI for reevaluation and further recommendations.  Jeri Lager, FNP-C [facility], [place] Office: [phone] Fax: [phone]

## 2018-11-30 ENCOUNTER — Encounter: Payer: Self-pay | Admitting: Cardiology

## 2018-11-30 DIAGNOSIS — Q251 Coarctation of aorta: Secondary | ICD-10-CM | POA: Insufficient documentation

## 2018-12-05 ENCOUNTER — Ambulatory Visit: Payer: Medicare Other

## 2018-12-05 DIAGNOSIS — I739 Peripheral vascular disease, unspecified: Secondary | ICD-10-CM

## 2018-12-19 ENCOUNTER — Other Ambulatory Visit: Payer: Self-pay | Admitting: Cardiology

## 2018-12-19 DIAGNOSIS — R0989 Other specified symptoms and signs involving the circulatory and respiratory systems: Secondary | ICD-10-CM

## 2018-12-24 ENCOUNTER — Other Ambulatory Visit: Payer: Self-pay | Admitting: Cardiology

## 2018-12-25 ENCOUNTER — Telehealth: Payer: Self-pay

## 2018-12-25 ENCOUNTER — Encounter: Payer: Medicare Other | Admitting: Cardiology

## 2018-12-25 ENCOUNTER — Other Ambulatory Visit: Payer: Self-pay

## 2019-01-16 NOTE — Progress Notes (Signed)
Patient no showed

## 2019-01-22 ENCOUNTER — Ambulatory Visit (INDEPENDENT_AMBULATORY_CARE_PROVIDER_SITE_OTHER): Payer: Medicare Other | Admitting: Cardiology

## 2019-01-22 ENCOUNTER — Other Ambulatory Visit: Payer: Self-pay

## 2019-01-22 ENCOUNTER — Encounter: Payer: Self-pay | Admitting: Cardiology

## 2019-01-22 VITALS — BP 122/56 | HR 61 | Ht 61.0 in | Wt 121.0 lb

## 2019-01-22 DIAGNOSIS — G8929 Other chronic pain: Secondary | ICD-10-CM

## 2019-01-22 DIAGNOSIS — R0789 Other chest pain: Secondary | ICD-10-CM | POA: Diagnosis not present

## 2019-01-22 DIAGNOSIS — Q251 Coarctation of aorta: Secondary | ICD-10-CM

## 2019-01-22 DIAGNOSIS — M5440 Lumbago with sciatica, unspecified side: Secondary | ICD-10-CM

## 2019-01-22 DIAGNOSIS — I10 Essential (primary) hypertension: Secondary | ICD-10-CM

## 2019-01-22 DIAGNOSIS — F17200 Nicotine dependence, unspecified, uncomplicated: Secondary | ICD-10-CM | POA: Diagnosis not present

## 2019-01-22 DIAGNOSIS — I739 Peripheral vascular disease, unspecified: Secondary | ICD-10-CM | POA: Diagnosis not present

## 2019-01-22 DIAGNOSIS — R079 Chest pain, unspecified: Secondary | ICD-10-CM

## 2019-01-22 NOTE — Progress Notes (Signed)
Subjective:   [name], female    DOB: 1960-10-25, 58 y.o.   MRN: [id number]  Bernerd Limbo, MD:  No chief complaint on file.  This visit type was conducted due to national recommendations for restrictions regarding the COVID-19 Pandemic (e.g. social distancing).  This format is felt to be most appropriate for this patient at this time.  All issues noted in this document were discussed and addressed.  No physical exam was performed (except for noted visual exam findings with Telehealth visits).  The patient has consented to conduct a Telehealth visit and understands insurance will be billed.   I discussed the limitations of evaluation and management by telemedicine and the availability of in person appointments. The patient expressed understanding and agreed to proceed.  Virtual Visit via Video Note is as below  I connected with [name], on 01/22/19 at 1450 by a video enabled telemedicine application and verified that I am speaking with the correct person using two identifiers.     I have discussed with her regarding the safety during COVID Pandemic and steps and precautions including social distancing with the patient.    HPI: [name]  is a 58 y.o. female  with  hypertension, COPD, diastolic CHF, ongoing tobacco abuse, and difficult to control hypertension with CKD, with bilateral intrarenal angioplasty in 2015 and again underwent renal angiography in 2017 that showed  restenosis of the left renal artery and underwent scoring balloon angioplasty with 6.020 mm angiosculpt balloon.She underwent renal duplex scan for surveillance that showed restenosis in the right renal artery with high-grade velocities. She underwent repeat renal and abdominal angiogram on 10/29/2017 that showed widely patent renal arteries, but distal abdominal aorta stenosis with diffuse disease, calcification was present for which she underwent stenting with 10x 29 mm Omnilink Elite stent.  Due  to continued symptoms of chest pain that was nitroglycerin responsive, underwent coronary angiogram on 03/25/2018 that revealed normal coronary arteries. She continues to have occasional chest pain that is felt to be related to her back; however, does have nitroglycerin responsive chest pain and last episode was one week ago. Not associated with exertion. Continues to smoke 1/2 PPD.  This is a virtual visit to discuss her lower extremity results. Previously she had reported left leg pain; however, today states is worse on the right leg with exertion. Does not occur each time with exertion and can also be at rest. No leg ulcerations. 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   She also has chronic back pain. She has been told to have severe CKD; however, on last check, kidney function is normal.     Past Medical History:  Diagnosis Date  . Anemia 2008  . Anxiety   . Aortic stenosis   . Arthritis    "all my joints ache" (09/07/2014)  . Asthma   . Bipolar 1 disorder ([place])   . Bradycardia   . Bruit   . Carpal tunnel syndrome   . Cervical cancer ([place]) 1985  . CHF (congestive heart failure) ([place])   . Chronic kidney disease (CKD), stage V ([place])   . COPD (chronic obstructive pulmonary disease) ([place]) 2000  . Coronary artery disease   . Daily headache   . Depression 2000  . Diverticulitis 2008  . GERD (gastroesophageal reflux disease)   . GERD (gastroesophageal reflux disease)   .  Glaucoma   . Heart murmur   . History of colon polyps 2009  . HLD (hyperlipidemia) 2013  . Hypertension 2013  . Hypovitaminosis D   . IBS (irritable bowel syndrome) 2008  . Myocardial infarction ([place])    2015  . PAD (peripheral artery disease) ([place])   . Pancreatitis 10/2011  . Pneumonia 07/2014  . RLS (restless legs syndrome)   . Schizophrenia ([place])   . Shortness of breath   . Skin cancer    . Small bowel obstruction ([place]) 2008    Past Surgical History:  Procedure Laterality Date  . ABDOMINAL ANGIOGRAM N/A 09/07/2014   Procedure: ABDOMINAL ANGIOGRAM;  Surgeon: Laverda Page, MD;  Location: [facility] CATH LAB;  Service: Cardiovascular;  Laterality: N/A;  . APPENDECTOMY  1996  . COLON SURGERY  2008   6 inches of colon removed due to obstruction  . COLONOSCOPY WITH PROPOFOL N/A 10/25/2016   Procedure: COLONOSCOPY WITH PROPOFOL;  Surgeon: Milus Banister, MD;  Location: [facility] ENDOSCOPY;  Service: Endoscopy;  Laterality: N/A;  . CORONARY ANGIOPLASTY WITH STENT PLACEMENT  09/07/2014   "2"  . LEFT HEART CATH AND CORONARY ANGIOGRAPHY N/A 03/25/2018   Procedure: LEFT HEART CATH AND CORONARY ANGIOGRAPHY;  Surgeon: Nigel Mormon, MD;  Location: [place] CV LAB;  Service: Cardiovascular;  Laterality: N/A;  . LEFT HEART CATHETERIZATION WITH CORONARY ANGIOGRAM N/A 09/07/2014   Procedure: LEFT HEART CATHETERIZATION WITH CORONARY ANGIOGRAM;  Surgeon: Laverda Page, MD;  Location: [facility] CATH LAB;  Service: Cardiovascular;  Laterality: N/A;  . LOWER EXTREMITY ANGIOGRAPHY  10/29/2017   Procedure: Lower Extremity Angiography;  Surgeon: Adrian Prows, MD;  Location: [place] CV LAB;  Service: Cardiovascular;;  . PERIPHERAL VASCULAR CATHETERIZATION N/A 11/01/2015   Procedure: Renal Angiography;  Surgeon: Adrian Prows, MD;  Location: [address] CV LAB;  Service: Cardiovascular;  Laterality: N/A;  . PERIPHERAL VASCULAR CATHETERIZATION N/A 04/10/2016   Procedure: Renal Angiography;  Surgeon: Adrian Prows, MD;  Location: [place] CV LAB;  Service: Cardiovascular;  Laterality: N/A;  . PERIPHERAL VASCULAR CATHETERIZATION  04/10/2016   Procedure: Peripheral Vascular Intervention;  Surgeon: Adrian Prows, MD;  Location: [place] CV LAB;  Service: Cardiovascular;;  . PERIPHERAL VASCULAR INTERVENTION  10/29/2017   Procedure: PERIPHERAL VASCULAR INTERVENTION;  Surgeon: Adrian Prows, MD;  Location: [place] CV LAB;   Service: Cardiovascular;;  . RENAL ANGIOGRAPHY N/A 10/29/2017   Procedure: RENAL ANGIOGRAPHY;  Surgeon: Adrian Prows, MD;  Location: [place] CV LAB;  Service: Cardiovascular;  Laterality: N/A;  . RIGHT OOPHORECTOMY Right 1996  . TOTAL ABDOMINAL HYSTERECTOMY  1997    Family History  Problem Relation Age of Onset  . Other Mother        many bowel obstructions  . Heart disease Mother   . Kidney cancer Father   . Bone cancer Father   . Diabetes Father   . Heart disease Father   . Diabetes Daughter     Social History   Socioeconomic History  . Marital status: Divorced    Spouse name: Not on file  . Number of children: 1  . Years of education: Not on file  . Highest education level: Not on file  Occupational History  . Occupation: Scientist, research (life sciences): UNEMPLOYED  Social Needs  . Financial resource strain: Not on file  . Food insecurity:    Worry: Not on file    Inability: Not on file  . Transportation needs:    Medical: Not on  file    Non-medical: Not on file  Tobacco Use  . Smoking status: Current Every Day Smoker    Packs/day: 0.75    Years: 40.00    Pack years: 30.00    Types: Cigarettes  . Smokeless tobacco: Never Used  Substance and Sexual Activity  . Alcohol use: No  . Drug use: Yes    Frequency: 3.0 times per week    Types: Marijuana    Comment: last use yesterday  . Sexual activity: Yes    Birth control/protection: Post-menopausal  Lifestyle  . Physical activity:    Days per week: Not on file    Minutes per session: Not on file  . Stress: Not on file  Relationships  . Social connections:    Talks on phone: Not on file    Gets together: Not on file    Attends religious service: Not on file    Active member of club or organization: Not on file    Attends meetings of clubs or organizations: Not on file    Relationship status: Not on file  . Intimate partner violence:    Fear of current or ex partner: Not on file    Emotionally abused: Not  on file    Physically abused: Not on file    Forced sexual activity: Not on file  Other Topics Concern  . Not on file  Social History Narrative  . Not on file    No outpatient medications have been marked as taking for the 01/22/19 encounter (Appointment) with Miquel Dunn, NP.     Review of Systems  Constitution: Negative for decreased appetite, malaise/fatigue, weight gain and weight loss.  Eyes: Negative for visual disturbance.  Cardiovascular: Positive for chest pain, claudication and dyspnea on exertion. Negative for leg swelling, orthopnea, palpitations and syncope.  Respiratory: Negative for hemoptysis and wheezing.   Endocrine: Negative for cold intolerance and heat intolerance.  Hematologic/Lymphatic: Does not bruise/bleed easily.  Skin: Negative for nail changes.  Musculoskeletal: Negative for muscle weakness and myalgias.  Gastrointestinal: Negative for abdominal pain, change in bowel habit, nausea and vomiting.  Neurological: Negative for difficulty with concentration, dizziness, focal weakness and headaches.  Psychiatric/Behavioral: Negative for altered mental status and suicidal ideas.  All other systems reviewed and are negative.      Objective:     There were no vitals taken for this visit.  Lower extremity arterial duplex 12/05/2018: No hemodynamically significant stenoses are identified in the lower extremity arterial system.  This exam reveals moderately decreased perfusion of the right lower extremity, noted at the dorsalis pedis and post tibial artery level (ABI 0.68) and mildly decreased perfusion of the left lower extremity, noted at the post tibial artery level (ABI 0.83). Triphasic waveform the the major vessels suggest diffuse small vessel disease. Left proximal SFA has < 50% stenosis.  208/06/2016, mild progression of PAD with decreased ABI bilaterally compared to left ABI 0.95 and right ABI 0.94.  Echocardiogram 02/26/2017: Left ventricle  cavity is normal in size. Normal global wall motion. Normal diastolic filling pattern. Calculated EF 63%. Left atrial cavity is mild to moderately dilated by volume. Borderline prolapse of the posterior MV leaflet with mild anteriorly directed MR. Mild tricuspid regurgitation. No evidence of pulmonary hypertension. Compared to hospital Echocardiogram 07/02/2014: Moderate mitral regurgitation and moderate pulmonary hypertension, PA pressure estimated to be 59 mmHg no longer present.   Lexiscan myoview stress test 02/17/2018: 1. Lexiscan stress test was performed. Exercise capacity was not assessed. Stress  symptoms included dizziness, nausea, headache, and chest pressure.. Peak blood pressure was 176/88 mmHg. Stress EKG is non diagnostic for ischemia as it is a pharmacologic stress. In addition, it showed The stress electrocardiogram showed sinus tachycardia, normal stress conduction, left ventricular hypertrophy, no stress arrhythmias and normal stress repolarization. 2. The overall quality of the study is excellent. Left ventricular cavity is noted to be normal on the rest and stress studies. Gated SPECT images reveal normal myocardial thickening and wall motion. The left ventricular ejection fraction was calculated or visually estimated to be 78%. SPECT images demonstrate small perfusion abnormality of moderate intensity in the mid anterolateral and apical lateral myocardial wall(s) on the stress images, reversible on rest images. Findings suggest small area of moderate intensity ischemia in mid to apical anterolateral myocardium. 3. Intermediate risk study.  03/25/2018-cardiac cath.-normal coronary arteries.  abdominal aortogram 10/29/2017: Widely patent renal arteries. Distal abdominal aorta diffuse disease, calcification with 80% stenosis S/P 10 x 29 mm Omnilink Elite stent, 80% to 0%.  Renal arteriogram 02/29/2018: Widely patient stents. 04/10/2016: Scoring balloon angioplasty left in-stent restenosis  with 6 x 20 mm angiosculpt balloon.  Right renal artery stent widely patent. H/O bilateral renal stenting on 09/07/2014: Right renal artery 6.0 x 15 mm Herculink widely patent, left renal artery  6.0 x 18 mm Herculink stent.  Renal artery duplex 09/18/2017: Hemodynamically significant stenosis of the right renal artery. Peak Velocity of 308/66 cm/s. Normal intrarenal vascular perfusion is noted in both kidneys. There is increased echogenecity of both kidneys suggests medico-renal disease with right kidney slightly shrunk at 8.03x3.97x4 cm compared to 9.144.664.6 cm Diffuse plaque noted in the abdominal aorta with calcification.  Compared to the study done on 01/22/2017, left renal artery stenosis not evident, right renal artery stenosis new.  Patient has h/o bilateral renal artery stenting.  Physical Exam  Constitutional: She is oriented to person, place, and time. She appears well-developed and well-nourished. No distress.  Neck: No JVD present. No tracheal deviation present.  Pulmonary/Chest: Effort normal. No respiratory distress.  Neurological: She is alert and oriented to person, place, and time.  Psychiatric: Her behavior is normal.        Assessment & Recommendations:   1. Peripheral arterial disease ([place]) I have discussed recently obtained LE arterial duplex results with the patient. Found to have mild progression of PAD by ABI; however, triphasic waveform throughout that is suggestive of small vessel disease. Continues to be on ASA and plavix and high dose statin. Would recommend continuing the same and monitor her claudication symptoms. Suspect her pain in her legs is multifactoral from PAD, neuropathy, and back pain. Advised her that her continued progression of PAD is purely related to continued tobacco use and have stressed the importance of cessation.  2. Chronic low back pain with sciatica, sciatica laterality unspecified, unspecified back pain laterality Suspect may be  contributing to her leg pain as well. On gabapentin for neuropathic pain, question if she would benefit from Lyrica. Will defer to PCP.  3. Tobacco use disorder [place] that patient work to completely quit smoking. I have advised her that her continued progression of PAD is related to this and will continue unless she quits smoking. Patient verbalizes understanding and states she will work to quit smoking.   4. Chest pain of uncertain etiology Had normal coronary angiogram in June 2019 that was also nitroglycerin responsive at that time. I suspect non-cardiac etiology for her chest pain possibly related to her back pain.   5.  Essential hypertension Well controlled on current medical therapy. Continue the same.   6. Abdominal aortic stenosis Continue on ASA and plavix. She is scheduled for carotid duplex in June, will also schedule for abdominal aortic duplex at that time to follow up given her history of stenosis and stent placement.    Plan: I will see her back in 4 months for follow up, but encouraged her to contact me sooner if needed for any new or worsening problems.    Jeri Lager, FNP-C [facility], [place] Office: [phone] Fax: [phone]

## 2019-03-09 ENCOUNTER — Other Ambulatory Visit: Payer: Medicare Other

## 2019-03-09 DIAGNOSIS — Z5329 Procedure and treatment not carried out because of patient's decision for other reasons: Secondary | ICD-10-CM

## 2019-03-10 ENCOUNTER — Telehealth: Payer: Self-pay

## 2019-03-10 NOTE — Telephone Encounter (Signed)
Pt called stating that she has been having a heaviness in both arms and cp/pressure x 3 days. Also having heart rate issues. She said its almost like anxiety. She can feel it coming on. She had an [place] appt scheduled yesterday but pt got sick on the way her and had to go back home. It has been rescheduled to 7/27. Please advise.//ah

## 2019-03-10 NOTE — Telephone Encounter (Signed)
Does she have any nitroglycerin? She will likely need to be seen.

## 2019-03-10 NOTE — Telephone Encounter (Signed)
Pt has nitro but has only been taking aspirin daily. LMOM to call and schedule appt.//ah

## 2019-03-11 ENCOUNTER — Encounter: Payer: Self-pay | Admitting: Cardiology

## 2019-03-11 ENCOUNTER — Ambulatory Visit (INDEPENDENT_AMBULATORY_CARE_PROVIDER_SITE_OTHER): Payer: Medicare Other | Admitting: Cardiology

## 2019-03-11 ENCOUNTER — Other Ambulatory Visit: Payer: Self-pay

## 2019-03-11 VITALS — BP 151/81 | HR 53 | Ht 61.0 in | Wt 118.0 lb

## 2019-03-11 DIAGNOSIS — R0789 Other chest pain: Secondary | ICD-10-CM | POA: Diagnosis not present

## 2019-03-11 DIAGNOSIS — Q251 Coarctation of aorta: Secondary | ICD-10-CM

## 2019-03-11 DIAGNOSIS — I209 Angina pectoris, unspecified: Secondary | ICD-10-CM | POA: Diagnosis not present

## 2019-03-11 DIAGNOSIS — F17200 Nicotine dependence, unspecified, uncomplicated: Secondary | ICD-10-CM | POA: Diagnosis not present

## 2019-03-11 MED ORDER — ISOSORBIDE MONONITRATE ER 30 MG PO TB24: 30.0000 mg | ORAL_TABLET | Freq: Every day | ORAL | 1 refills | Status: DC

## 2019-03-11 NOTE — Telephone Encounter (Signed)
Appt 6/10.//ah

## 2019-03-11 NOTE — Progress Notes (Signed)
Subjective:   [name], female    DOB: 07/10/1961, 58 y.o.   MRN: [id number]  Bernerd Limbo, MD:  Chief Complaint  Patient presents with   Hypertension   Chest Pain     HPI: [name]  is a 58 y.o. female  with  hypertension, COPD, diastolic CHF, ongoing tobacco abuse, and difficult to control hypertension with CKD, with bilateral intrarenal angioplasty in 2015 and again underwent renal angiography in 2017 that showed  restenosis of the left renal artery and underwent scoring balloon angioplasty with 6.020 mm angiosculpt balloon.She underwent renal duplex scan for surveillance that showed restenosis in the right renal artery with high-grade velocities. She underwent repeat renal and abdominal angiogram on 10/29/2017 that showed widely patent renal arteries, but distal abdominal aorta stenosis with diffuse disease, calcification was present for which she underwent stenting with 10x 29 mm Omnilink Elite stent.  Patient seen for acute visit today for chest pain. For the last 2 weeks has had more frequent episodes of chest pain with exertion associated with bilateral arm heaviness. States that her activity level has decreased because of this. She used nitroglycerin last week that relieved her discomfort. She can have bilateral arm heaviness without chest pain occasionally. She underwent coronary angiogram in June 2019 for nitroglycerin responsive chest pain that revealed normal coronary arteries.   Continues to smoke 1/2 PPD.  She was scheduled for abdominal aortic duplex at her last office visit, that she has not had performed yet.   She also has chronic back pain. She has been told to have severe CKD; however, on last check, kidney function is normal.     Past Medical History:  Diagnosis Date   Anemia 2008   Anxiety    Aortic stenosis    Arthritis    "all my joints ache" (09/07/2014)   Asthma    Bipolar 1 disorder (HCC)    Bradycardia    Bruit     Carpal tunnel syndrome    Cervical cancer ([place]) 1985   CHF (congestive heart failure) (HCC)    Chronic kidney disease (CKD), stage V (HCC)    COPD (chronic obstructive pulmonary disease) ([place]) 2000   Coronary artery disease    Daily headache    Depression 2000   Diverticulitis 2008   GERD (gastroesophageal reflux disease)    GERD (gastroesophageal reflux disease)    Glaucoma    Heart murmur    History of colon polyps 2009   HLD (hyperlipidemia) 2013   Hypertension 2013   Hypovitaminosis D    IBS (irritable bowel syndrome) 2008   Myocardial infarction [facility])    2015   PAD (peripheral artery disease) ([place])    Pancreatitis 10/2011   Pneumonia 07/2014   RLS (restless legs syndrome)    Schizophrenia (HCC)    Shortness of breath    Skin cancer    Small bowel obstruction ([place]) 2008    Past Surgical History:  Procedure Laterality Date   ABDOMINAL ANGIOGRAM N/A 09/07/2014   Procedure: ABDOMINAL ANGIOGRAM;  Surgeon: Laverda Page, MD;  Location: [facility] CATH LAB;  Service: Cardiovascular;  Laterality: N/A;   APPENDECTOMY  1996   COLON SURGERY  2008   6 inches of colon removed due to obstruction   COLONOSCOPY WITH PROPOFOL N/A 10/25/2016   Procedure: COLONOSCOPY WITH PROPOFOL;  Surgeon: Milus Banister, MD;  Location: [facility] ENDOSCOPY;  Service: Endoscopy;  Laterality: N/A;   CORONARY ANGIOPLASTY WITH STENT PLACEMENT  09/07/2014   "  2"   LEFT HEART CATH AND CORONARY ANGIOGRAPHY N/A 03/25/2018   Procedure: LEFT HEART CATH AND CORONARY ANGIOGRAPHY;  Surgeon: Nigel Mormon, MD;  Location: [place] CV LAB;  Service: Cardiovascular;  Laterality: N/A;   LEFT HEART CATHETERIZATION WITH CORONARY ANGIOGRAM N/A 09/07/2014   Procedure: LEFT HEART CATHETERIZATION WITH CORONARY ANGIOGRAM;  Surgeon: Laverda Page, MD;  Location: [facility] CATH LAB;  Service: Cardiovascular;  Laterality: N/A;   LOWER EXTREMITY ANGIOGRAPHY  10/29/2017   Procedure: Lower Extremity  Angiography;  Surgeon: Adrian Prows, MD;  Location: [place] CV LAB;  Service: Cardiovascular;;   PERIPHERAL VASCULAR CATHETERIZATION N/A 11/01/2015   Procedure: Renal Angiography;  Surgeon: Adrian Prows, MD;  Location: [place] CV LAB;  Service: Cardiovascular;  Laterality: N/A;   PERIPHERAL VASCULAR CATHETERIZATION N/A 04/10/2016   Procedure: Renal Angiography;  Surgeon: Adrian Prows, MD;  Location: [place] CV LAB;  Service: Cardiovascular;  Laterality: N/A;   PERIPHERAL VASCULAR CATHETERIZATION  04/10/2016   Procedure: Peripheral Vascular Intervention;  Surgeon: Adrian Prows, MD;  Location: [place] CV LAB;  Service: Cardiovascular;;   PERIPHERAL VASCULAR INTERVENTION  10/29/2017   Procedure: PERIPHERAL VASCULAR INTERVENTION;  Surgeon: Adrian Prows, MD;  Location: [place] CV LAB;  Service: Cardiovascular;;   RENAL ANGIOGRAPHY N/A 10/29/2017   Procedure: RENAL ANGIOGRAPHY;  Surgeon: Adrian Prows, MD;  Location: [place] CV LAB;  Service: Cardiovascular;  Laterality: N/A;   RIGHT OOPHORECTOMY Right 1996   TOTAL ABDOMINAL HYSTERECTOMY  1997    Family History  Problem Relation Age of Onset   Other Mother        many bowel obstructions   Heart disease Mother    Kidney cancer Father    Bone cancer Father    Diabetes Father    Heart disease Father    Diabetes Daughter    Heart disease Brother     Social History   Socioeconomic History   Marital status: Divorced    Spouse name: Not on file   Number of children: 1   Years of education: Not on file   Highest education level: Not on file  Occupational History   Occupation: Scientist, research (life sciences): UNEMPLOYED  Social Designer, fashion/clothing strain: Not on file   Food insecurity    Worry: Not on file    Inability: Not on file   Transportation needs    Medical: Not on file    Non-medical: Not on file  Tobacco Use   Smoking status: Current Every Day Smoker    Packs/day: 0.75    Years: 40.00     Pack years: 30.00    Types: Cigarettes   Smokeless tobacco: Never Used  Substance and Sexual Activity   Alcohol use: No   Drug use: Yes    Frequency: 3.0 times per week    Types: Marijuana    Comment: last use yesterday   Sexual activity: Yes    Birth control/protection: Post-menopausal  Lifestyle   Physical activity    Days per week: Not on file    Minutes per session: Not on file   Stress: Not on file  Relationships   Social connections    Talks on phone: Not on file    Gets together: Not on file    Attends religious service: Not on file    Active member of club or organization: Not on file    Attends meetings of clubs or organizations: Not on file    Relationship status:  Not on file   Intimate partner violence    Fear of current or ex partner: Not on file    Emotionally abused: Not on file    Physically abused: Not on file    Forced sexual activity: Not on file  Other Topics Concern   Not on file  Social History Narrative   Not on file    Current Meds  Medication Sig   acetaminophen (TYLENOL) 500 MG tablet Take 1,000 mg by mouth every 8 (eight) hours as needed for mild pain or headache.   albuterol (PROVENTIL HFA;VENTOLIN HFA) 108 (90 BASE) MCG/ACT inhaler Inhale 2 puffs into the lungs 2 (two) times daily as needed (asthma).    ALPRAZolam (XANAX) 1 MG tablet Take 1 tablet by mouth 2 (two) times daily as needed for anxiety.    aspirin EC 81 MG tablet Take 81 mg by mouth daily.   atenolol (TENORMIN) 25 MG tablet Take 12.5 mg by mouth daily.   atorvastatin (LIPITOR) 80 MG tablet Take 80 mg by mouth daily.    clopidogrel (PLAVIX) 75 MG tablet TAKE 1 TABLET BY MOUTH ONCE DAILY   conjugated estrogens (PREMARIN) vaginal cream Place vaginally.   gabapentin (NEURONTIN) 400 MG capsule Take 1 capsule (400 mg total) by mouth 2 (two) times daily. Patient takes 400 mg 3 times daily and 1200 mg at bedtime (Patient taking differently: Take 400 mg by mouth at  bedtime. )   mometasone-formoterol (DULERA) 100-5 MCG/ACT AERO Take 2 puffs first thing in am and then another 2 puffs about 12 hours later. (Patient taking differently: Inhale 2 puffs into the lungs 2 (two) times daily. Take 2 puffs first thing in am and then another 2 puffs about 12 hours later.)   Multiple Vitamin (MULTIVITAMIN WITH MINERALS) TABS tablet Take 1 tablet by mouth daily.   nitroGLYCERIN (NITROSTAT) 0.4 MG SL tablet Place 0.4 mg under the tongue every 5 (five) minutes as needed for chest pain.   pantoprazole (PROTONIX) 40 MG tablet Take 40 mg by mouth daily.   venlafaxine XR (EFFEXOR XR) 150 MG 24 hr capsule Take 1 capsule (150 mg total) by mouth daily with breakfast. (Patient taking differently: Take 300 mg by mouth daily with breakfast. )   vitamin B-12 (CYANOCOBALAMIN) 500 MCG tablet Take 500 mcg by mouth daily.     Review of Systems  Constitution: Negative for decreased appetite, malaise/fatigue, weight gain and weight loss.  Eyes: Negative for visual disturbance.  Cardiovascular: Positive for chest pain, claudication and dyspnea on exertion. Negative for leg swelling, orthopnea, palpitations and syncope.  Respiratory: Negative for hemoptysis and wheezing.   Endocrine: Negative for cold intolerance and heat intolerance.  Hematologic/Lymphatic: Does not bruise/bleed easily.  Skin: Negative for nail changes.  Musculoskeletal: Negative for muscle weakness and myalgias.  Gastrointestinal: Negative for abdominal pain, change in bowel habit, nausea and vomiting.  Neurological: Negative for difficulty with concentration, dizziness, focal weakness and headaches.  Psychiatric/Behavioral: Negative for altered mental status and suicidal ideas.  All other systems reviewed and are negative.      Objective:     Blood pressure (!) 151/81, pulse (!) 53, height 5\' 1"  (1.549 m), weight 118 lb (53.5 kg), SpO2 98 %.  Lower extremity arterial duplex 12/05/2018: No hemodynamically  significant stenoses are identified in the lower extremity arterial system.  This exam reveals moderately decreased perfusion of the right lower extremity, noted at the dorsalis pedis and post tibial artery level (ABI 0.68) and mildly decreased perfusion of the  left lower extremity, noted at the post tibial artery level (ABI 0.83). Triphasic waveform the the major vessels suggest diffuse small vessel disease. Left proximal SFA has < 50% stenosis.  208/06/2016, mild progression of PAD with decreased ABI bilaterally compared to left ABI 0.95 and right ABI 0.94.  Echocardiogram 02/26/2017: Left ventricle cavity is normal in size. Normal global wall motion. Normal diastolic filling pattern. Calculated EF 63%. Left atrial cavity is mild to moderately dilated by volume. Borderline prolapse of the posterior MV leaflet with mild anteriorly directed MR. Mild tricuspid regurgitation. No evidence of pulmonary hypertension. Compared to hospital Echocardiogram 07/02/2014: Moderate mitral regurgitation and moderate pulmonary hypertension, PA pressure estimated to be 59 mmHg no longer present.   Lexiscan myoview stress test 02/17/2018: 1. Lexiscan stress test was performed. Exercise capacity was not assessed. Stress symptoms included dizziness, nausea, headache, and chest pressure.. Peak blood pressure was 176/88 mmHg. Stress EKG is non diagnostic for ischemia as it is a pharmacologic stress. In addition, it showed The stress electrocardiogram showed sinus tachycardia, normal stress conduction, left ventricular hypertrophy, no stress arrhythmias and normal stress repolarization. 2. The overall quality of the study is excellent. Left ventricular cavity is noted to be normal on the rest and stress studies. Gated SPECT images reveal normal myocardial thickening and wall motion. The left ventricular ejection fraction was calculated or visually estimated to be 78%. SPECT images demonstrate small perfusion abnormality of  moderate intensity in the mid anterolateral and apical lateral myocardial wall(s) on the stress images, reversible on rest images. Findings suggest small area of moderate intensity ischemia in mid to apical anterolateral myocardium. 3. Intermediate risk study.  03/25/2018-cardiac cath.-normal coronary arteries.  abdominal aortogram 10/29/2017: Widely patent renal arteries. Distal abdominal aorta diffuse disease, calcification with 80% stenosis S/P 10 x 29 mm Omnilink Elite stent, 80% to 0%.  Renal arteriogram 02/29/2018: Widely patient stents. 04/10/2016: Scoring balloon angioplasty left in-stent restenosis with 6 x 20 mm angiosculpt balloon.  Right renal artery stent widely patent. H/O bilateral renal stenting on 09/07/2014: Right renal artery 6.0 x 15 mm Herculink widely patent, left renal artery  6.0 x 18 mm Herculink stent.  Renal artery duplex 09/18/2017: Hemodynamically significant stenosis of the right renal artery. Peak Velocity of 308/66 cm/s. Normal intrarenal vascular perfusion is noted in both kidneys. There is increased echogenecity of both kidneys suggests medico-renal disease with right kidney slightly shrunk at 8.03x3.97x4 cm compared to 9.144.664.6 cm Diffuse plaque noted in the abdominal aorta with calcification.  Compared to the study done on 01/22/2017, left renal artery stenosis not evident, right renal artery stenosis new.  Patient has h/o bilateral renal artery stenting.  Physical Exam  Constitutional: She is oriented to person, place, and time. Vital signs are normal. She appears well-developed and well-nourished.  HENT:  Head: Normocephalic and atraumatic.  Neck: Normal range of motion.  Cardiovascular: Normal rate, regular rhythm, normal heart sounds and intact distal pulses.  Pulses:      Carotid pulses are on the right side with bruit and on the left side with bruit.      Femoral pulses are 2+ on the right side with bruit and 2+ on the left side with bruit.       Popliteal pulses are 1+ on the right side and 1+ on the left side.       Dorsalis pedis pulses are 1+ on the right side and 1+ on the left side.       Posterior tibial pulses are 1+  on the right side and 1+ on the left side.  Pulmonary/Chest: Effort normal and breath sounds normal. No accessory muscle usage. No respiratory distress. She exhibits tenderness.  Prolonged expiration, barrel shaped chest  Abdominal: Soft. Bowel sounds are normal. She exhibits abdominal bruit.  Musculoskeletal: Normal range of motion.  Neurological: She is alert and oriented to person, place, and time.  Skin: Skin is warm and dry.  Vitals reviewed.       Assessment & Recommendations:   1. Angina pectoris ([place])   2. Musculoskeletal chest pain   3. Tobacco use disorder   4. Abdominal aortic stenosis    EKG 03/11/2019: Sinus bradycardia at 53 bpm, left atrial enlargement, normal axis, no evidence of ischemia.    Plan:  Patient seen for acute visit today for increased frequency of chest pain. While she does have musculoskeletal chest pain noted with palpation of chest wall in substernal region, this chest pain is different than her exertional chest pain. Suspect her symptoms of angina are likely related to microvascular disease as she had normal coronary angiogram in 2019. I will start her on Isosorbide mononitrate 30 mg daily. If she does not have improvement, will try ranexa. Advised her to use nitroglycerin as needed. Blood pressure is also elevated today.   She will continue with aortic duplex as ordered previously. I again have urged her to quit smoking. She is not interested in medications. I will see her in a few weeks for follow up.    *I have discussed this case with Dr. Einar Gip and he participated in formulating the plan.*    Jeri Lager, [facility], [place] Office: [phone] Fax: [phone]

## 2019-03-12 ENCOUNTER — Encounter: Payer: Self-pay | Admitting: Cardiology

## 2019-03-25 ENCOUNTER — Ambulatory Visit: Payer: Medicare Other | Admitting: Cardiology

## 2019-03-25 NOTE — Progress Notes (Deleted)
Primary Physician:  Bernerd Limbo, MD   [name], female    DOB: December 10, 1960, 58 y.o.   MRN: [id number]  Subjective:    No chief complaint on file.   HPI: [name]  is a 58 y.o. female  with hypertension, COPD, diastolic CHF, ongoing tobacco abuse, and difficult to control hypertension with CKD, with bilateral intrarenal angioplasty in 2015 and again underwent renal angiography in 2017 that showed  restenosis of the left renal artery and underwent scoring balloon angioplasty with 6.020 mm angiosculpt balloon.She underwent renal duplex scan for surveillance that showed restenosis in the right renal artery with high-grade velocities. She underwent repeat renal and abdominal angiogram on 10/29/2017 that showed widely patent renal arteries, but distal abdominal aorta stenosis with diffuse disease, calcification was present for which she underwent stenting with 10x 29 mm Omnilink Elite stent.  Patient seen 2 weeks ago for symptoms of angina felt to be related to microvascular disease, she was started on Imdur 30 mg daily. She now presents for follow up.  She underwent coronary angiogram in June 2019 for nitroglycerin responsive chest pain that revealed normal coronary arteries.   Continues to smoke 1/2 PPD.  She was scheduled for abdominal aortic duplex at her last office visit, that she has not had performed yet.   She also has chronic back pain. She has been told to have severe CKD; however, on last check, kidney function is normal.   Past Medical History:  Diagnosis Date   Anemia 2008   Anxiety    Aortic stenosis    Arthritis    "all my joints ache" (09/07/2014)   Asthma    Bipolar 1 disorder (HCC)    Bradycardia    Bruit    Carpal tunnel syndrome    Cervical cancer ([place]) 1985   CHF (congestive heart failure) (HCC)    Chronic kidney disease (CKD), stage V (HCC)    COPD (chronic obstructive pulmonary disease) ([place]) 2000   Coronary  artery disease    Daily headache    Depression 2000   Diverticulitis 2008   GERD (gastroesophageal reflux disease)    GERD (gastroesophageal reflux disease)    Glaucoma    Heart murmur    History of colon polyps 2009   HLD (hyperlipidemia) 2013   Hypertension 2013   Hypovitaminosis D    IBS (irritable bowel syndrome) 2008   Myocardial infarction [facility])    2015   PAD (peripheral artery disease) ([place])    Pancreatitis 10/2011   Pneumonia 07/2014   RLS (restless legs syndrome)    Schizophrenia (HCC)    Shortness of breath    Skin cancer    Small bowel obstruction ([place]) 2008    Past Surgical History:  Procedure Laterality Date   ABDOMINAL ANGIOGRAM N/A 09/07/2014   Procedure: ABDOMINAL ANGIOGRAM;  Surgeon: Laverda Page, MD;  Location: [facility] CATH LAB;  Service: Cardiovascular;  Laterality: N/A;   APPENDECTOMY  1996   COLON SURGERY  2008   6 inches of colon removed due to obstruction   COLONOSCOPY WITH PROPOFOL N/A 10/25/2016   Procedure: COLONOSCOPY WITH PROPOFOL;  Surgeon: Milus Banister, MD;  Location: [facility] ENDOSCOPY;  Service: Endoscopy;  Laterality: N/A;   CORONARY ANGIOPLASTY WITH STENT PLACEMENT  09/07/2014   "2"   LEFT HEART CATH AND CORONARY ANGIOGRAPHY N/A 03/25/2018   Procedure: LEFT HEART CATH AND CORONARY ANGIOGRAPHY;  Surgeon: Nigel Mormon, MD;  Location: [place] CV LAB;  Service:  Cardiovascular;  Laterality: N/A;   LEFT HEART CATHETERIZATION WITH CORONARY ANGIOGRAM N/A 09/07/2014   Procedure: LEFT HEART CATHETERIZATION WITH CORONARY ANGIOGRAM;  Surgeon: Laverda Page, MD;  Location: [facility] CATH LAB;  Service: Cardiovascular;  Laterality: N/A;   LOWER EXTREMITY ANGIOGRAPHY  10/29/2017   Procedure: Lower Extremity Angiography;  Surgeon: Adrian Prows, MD;  Location: [place] CV LAB;  Service: Cardiovascular;;   PERIPHERAL VASCULAR CATHETERIZATION N/A 11/01/2015   Procedure: Renal Angiography;  Surgeon: Adrian Prows, MD;  Location: [place] CV LAB;  Service: Cardiovascular;  Laterality: N/A;   PERIPHERAL VASCULAR CATHETERIZATION N/A 04/10/2016   Procedure: Renal Angiography;  Surgeon: Adrian Prows, MD;  Location: [facility] CV LAB;  Service: Cardiovascular;  Laterality: N/A;   PERIPHERAL VASCULAR CATHETERIZATION  04/10/2016   Procedure: Peripheral Vascular Intervention;  Surgeon: Adrian Prows, MD;  Location: [place] CV LAB;  Service: Cardiovascular;;   PERIPHERAL VASCULAR INTERVENTION  10/29/2017   Procedure: PERIPHERAL VASCULAR INTERVENTION;  Surgeon: Adrian Prows, MD;  Location: [place] CV LAB;  Service: Cardiovascular;;   RENAL ANGIOGRAPHY N/A 10/29/2017   Procedure: RENAL ANGIOGRAPHY;  Surgeon: Adrian Prows, MD;  Location: [place] CV LAB;  Service: Cardiovascular;  Laterality: N/A;   RIGHT OOPHORECTOMY Right 1996   TOTAL ABDOMINAL HYSTERECTOMY  1997    Social History   Socioeconomic History   Marital status: Divorced    Spouse name: Not on file   Number of children: 1   Years of education: Not on file   Highest education level: Not on file  Occupational History   Occupation: Scientist, research (life sciences): UNEMPLOYED  Social Designer, fashion/clothing strain: Not on file   Food insecurity    Worry: Not on file    Inability: Not on file   Transportation needs    Medical: Not on file    Non-medical: Not on file  Tobacco Use   Smoking status: Current Every Day Smoker    Packs/day: 0.75    Years: 40.00    Pack years: 30.00    Types: Cigarettes   Smokeless tobacco: Never Used  Substance and Sexual Activity   Alcohol use: No   Drug use: Yes    Frequency: 3.0 times per week    Types: Marijuana    Comment: last use yesterday   Sexual activity: Yes    Birth control/protection: Post-menopausal  Lifestyle   Physical activity    Days per week: Not on file    Minutes per session: Not on file   Stress: Not on file  Relationships   Social connections    Talks on phone: Not on file     Gets together: Not on file    Attends religious service: Not on file    Active member of club or organization: Not on file    Attends meetings of clubs or organizations: Not on file    Relationship status: Not on file   Intimate partner violence    Fear of current or ex partner: Not on file    Emotionally abused: Not on file    Physically abused: Not on file    Forced sexual activity: Not on file  Other Topics Concern   Not on file  Social History Narrative   Not on file    ROS    Objective:  There were no vitals taken for this visit. There is no height or weight on file to calculate BMI.    Physical Exam Radiology: No results  found.  Laboratory examination:   *** CMP Latest Ref Rng & Units 06/05/2017 11/09/2015 11/02/2015  Glucose 65 - 99 mg/dL 68 111(H) 153(H)  BUN 6 - 20 mg/dL 16 12 23(H)  Creatinine 0.44 - 1.00 mg/dL 1.51(H) 1.11(H) 1.58(H)  Sodium 135 - 145 mmol/L 134(L) 137 134(L)  Potassium 3.5 - 5.1 mmol/L 4.5 4.8 3.6  Chloride 101 - 111 mmol/L 99(L) 107 93(L)  CO2 22 - 32 mmol/L 27 19(L) 28  Calcium 8.9 - 10.3 mg/dL 9.7 9.9 8.8(L)  Total Protein 6.5 - 8.1 g/dL - - -  Total Bilirubin 0.3 - 1.2 mg/dL - - -  Alkaline Phos 38 - 126 U/L - - -  AST 15 - 41 U/L - - -  ALT 14 - 54 U/L - - -   CBC Latest Ref Rng & Units 06/05/2017 11/09/2015 11/02/2015  WBC 4.0 - 10.5 K/uL 7.0 7.6 10.5  Hemoglobin 12.0 - 15.0 g/dL 11.6(L) 11.3(L) 9.3(L)  Hematocrit 36.0 - 46.0 % 35.9(L) 33.9(L) 29.2(L)  Platelets 150 - 400 K/uL 402(H) 523(H) 314   Lipid Panel     Component Value Date/Time   CHOL 190 10/08/2012 0228   TRIG 152 (H) 10/08/2012 0228   HDL 51 10/08/2012 0228   CHOLHDL 3.7 10/08/2012 0228   VLDL 30 10/08/2012 0228   LDLCALC 109 (H) 10/08/2012 0228   HEMOGLOBIN A1C No results found for: HGBA1C, MPG TSH No results for input(s): TSH in the last 8760 hours.  PRN Meds:. There are no discontinued medications. No outpatient medications have been marked as taking for  the 03/25/19 encounter (Appointment) with Miquel Dunn, NP.    Cardiac Studies:   Lower extremity arterial duplex 12/05/2018: No hemodynamically significant stenoses are identified in the lower extremity arterial system.  This exam reveals moderately decreased perfusion of the right lower extremity, noted at the dorsalis pedis and post tibial artery level (ABI 0.68) and mildly decreased perfusion of the left lower extremity, noted at the post tibial artery level (ABI 0.83). Triphasic waveform the the major vessels suggest diffuse small vessel disease. Left proximal SFA has < 50% stenosis.  208/06/2016, mild progression of PAD with decreased ABI bilaterally compared to left ABI 0.95 and right ABI 0.94.  Echocardiogram 02/26/2017: Left ventricle cavity is normal in size. Normal global wall motion. Normal diastolic filling pattern. Calculated EF 63%. Left atrial cavity is mild to moderately dilated by volume. Borderline prolapse of the posterior MV leaflet with mild anteriorly directed MR. Mild tricuspid regurgitation. No evidence of pulmonary hypertension. Compared to hospital Echocardiogram 07/02/2014: Moderate mitral regurgitation and moderate pulmonary hypertension, PA pressure estimated to be 59 mmHg no longer present.   Lexiscan myoview stress test 02/17/2018: 1. Lexiscan stress test was performed. Exercise capacity was not assessed. Stress symptoms included dizziness, nausea, headache, and chest pressure.. Peak blood pressure was 176/88 mmHg. Stress EKG is non diagnostic for ischemia as it is a pharmacologic stress. In addition, it showed The stress electrocardiogram showed sinus tachycardia, normal stress conduction, left ventricular hypertrophy, no stress arrhythmias and normal stress repolarization. 2. The overall quality of the study is excellent. Left ventricular cavity is noted to be normal on the rest and stress studies. Gated SPECT images reveal normal myocardial thickening and  wall motion. The left ventricular ejection fraction was calculated or visually estimated to be 78%. SPECT images demonstrate small perfusion abnormality of moderate intensity in the mid anterolateral and apical lateral myocardial wall(s) on the stress images, reversible on rest images. Findings suggest  small area of moderate intensity ischemia in mid to apical anterolateral myocardium. 3. Intermediate risk study.  03/25/2018-cardiac cath.-normal coronary arteries.  abdominal aortogram 10/29/2017: Widely patent renal arteries. Distal abdominal aorta diffuse disease, calcification with 80% stenosis S/P 10 x 29 mm Omnilink Elite stent, 80% to 0%.  Renal arteriogram 02/29/2018: Widely patient stents. 04/10/2016: Scoring balloon angioplasty left in-stent restenosis with 6 x 20 mm angiosculpt balloon.  Right renal artery stent widely patent. H/O bilateral renal stenting on 09/07/2014: Right renal artery 6.0 x 15 mm Herculink widely patent, left renal artery  6.0 x 18 mm Herculink stent.  Renal artery duplex 09/18/2017: Hemodynamically significant stenosis of the right renal artery. Peak Velocity of 308/66 cm/s. Normal intrarenal vascular perfusion is noted in both kidneys. There is increased echogenecity of both kidneys suggests medico-renal disease with right kidney slightly shrunk at 8.03x3.97x4 cm compared to 9.144.664.6 cm Diffuse plaque noted in the abdominal aorta with calcification.  Compared to the study done on 01/22/2017, left renal artery stenosis not evident, right renal artery stenosis new.  Patient has h/o bilateral renal artery stenting.  Assessment:   No diagnosis found.  EKG 03/11/2019: Sinus bradycardia at 53 bpm, left atrial enlargement, normal axis, no evidence of ischemia.   Recommendations:   If no improvement in chest pain with Imdur, will try ranexa.  Miquel Dunn, MSN, APRN, FNP-C [facility]. [place] Office: [phone] Fax: [phone]

## 2019-03-30 NOTE — Progress Notes (Signed)
Primary Physician:  Bernerd Limbo, MD   [name], female    DOB: 05/10/1961, 58 y.o.   MRN: [id number]  Subjective:    Chief Complaint  Patient presents with  . Hypertension  . Chest Pain  . Follow-up    HPI: [name]  is a 58 y.o. female  with hypertension, COPD, diastolic CHF, ongoing tobacco abuse, and difficult to control hypertension with CKD, with bilateral intrarenal angioplasty in 2015 and again underwent renal angiography in 2017 that showed  restenosis of the left renal artery and underwent scoring balloon angioplasty with 6.020 mm angiosculpt balloon.She underwent renal duplex scan for surveillance that showed restenosis in the right renal artery with high-grade velocities. She underwent repeat renal and abdominal angiogram on 10/29/2017 that showed widely patent renal arteries, but distal abdominal aorta stenosis with diffuse disease, calcification was present for which she underwent stenting with 10x 29 mm Omnilink Elite stent.  Patient seen 2 weeks ago for symptoms of angina felt to be related to microvascular disease, she was started on Imdur 30 mg daily. She now presents for follow up.  Reports significant improvement in chest pain with addition of Imdur. She has been taking 1 1/2 of atenolol due to high BP. Overall feeling well. She underwent coronary angiogram in June 2019 for nitroglycerin responsive chest pain that revealed normal coronary arteries.   Continues to smoke 1/2 PPD.  She was scheduled for abdominal aortic duplex a few months ago, that she has not had performed yet.   She also has chronic back pain. She has been told to have severe CKD; however, on last check, kidney function is normal. Patient continues to feel that she needs to see nephrologist as she has bilateral flank pain with standing for prolonged periods.   Past Medical History:  Diagnosis Date  . Anemia 2008  . Anxiety   . Aortic stenosis   . Arthritis    "all my joints ache" (09/07/2014)  . Asthma   . Bipolar 1 disorder ([place])   . Bradycardia   . Bruit   . Carpal tunnel syndrome   . Cervical cancer ([place]) 1985  . CHF (congestive heart failure) ([place])   . Chronic kidney disease (CKD), stage V ([place])   . COPD (chronic obstructive pulmonary disease) ([place]) 2000  . Coronary artery disease   . Daily headache   . Depression 2000  . Diverticulitis 2008  . GERD (gastroesophageal reflux disease)   . GERD (gastroesophageal reflux disease)   . Glaucoma   . Heart murmur   . History of colon polyps 2009  . HLD (hyperlipidemia) 2013  . Hypertension 2013  . Hypovitaminosis D   . IBS (irritable bowel syndrome) 2008  . Myocardial infarction ([place])    2015  . PAD (peripheral artery disease) ([place])   . Pancreatitis 10/2011  . Pneumonia 07/2014  . RLS (restless legs syndrome)   . Schizophrenia ([place])   . Shortness of breath   . Skin cancer   . Small bowel obstruction ([place]) 2008    Past Surgical History:  Procedure Laterality Date  . ABDOMINAL ANGIOGRAM N/A 09/07/2014   Procedure: ABDOMINAL ANGIOGRAM;  Surgeon: Laverda Page, MD;  Location: [facility] CATH LAB;  Service: Cardiovascular;  Laterality: N/A;  . APPENDECTOMY  1996  . COLON SURGERY  2008   6 inches of colon removed due to obstruction  . COLONOSCOPY WITH PROPOFOL N/A 10/25/2016   Procedure: COLONOSCOPY WITH PROPOFOL;  Surgeon: Milus Banister,  MD;  Location: [facility] ENDOSCOPY;  Service: Endoscopy;  Laterality: N/A;  . CORONARY ANGIOPLASTY WITH STENT PLACEMENT  09/07/2014   "2"  . LEFT HEART CATH AND CORONARY ANGIOGRAPHY N/A 03/25/2018   Procedure: LEFT HEART CATH AND CORONARY ANGIOGRAPHY;  Surgeon: Nigel Mormon, MD;  Location: [place] CV LAB;  Service: Cardiovascular;  Laterality: N/A;  . LEFT HEART CATHETERIZATION WITH CORONARY ANGIOGRAM N/A 09/07/2014   Procedure: LEFT HEART CATHETERIZATION WITH CORONARY ANGIOGRAM;  Surgeon: Laverda Page, MD;  Location: [facility] CATH LAB;  Service:  Cardiovascular;  Laterality: N/A;  . LOWER EXTREMITY ANGIOGRAPHY  10/29/2017   Procedure: Lower Extremity Angiography;  Surgeon: Adrian Prows, MD;  Location: [place] CV LAB;  Service: Cardiovascular;;  . PERIPHERAL VASCULAR CATHETERIZATION N/A 11/01/2015   Procedure: Renal Angiography;  Surgeon: Adrian Prows, MD;  Location: [place] CV LAB;  Service: Cardiovascular;  Laterality: N/A;  . PERIPHERAL VASCULAR CATHETERIZATION N/A 04/10/2016   Procedure: Renal Angiography;  Surgeon: Adrian Prows, MD;  Location: [place] CV LAB;  Service: Cardiovascular;  Laterality: N/A;  . PERIPHERAL VASCULAR CATHETERIZATION  04/10/2016   Procedure: Peripheral Vascular Intervention;  Surgeon: Adrian Prows, MD;  Location: [place] CV LAB;  Service: Cardiovascular;;  . PERIPHERAL VASCULAR INTERVENTION  10/29/2017   Procedure: PERIPHERAL VASCULAR INTERVENTION;  Surgeon: Adrian Prows, MD;  Location: [place] CV LAB;  Service: Cardiovascular;;  . RENAL ANGIOGRAPHY N/A 10/29/2017   Procedure: RENAL ANGIOGRAPHY;  Surgeon: Adrian Prows, MD;  Location: [place] CV LAB;  Service: Cardiovascular;  Laterality: N/A;  . RIGHT OOPHORECTOMY Right 1996  . TOTAL ABDOMINAL HYSTERECTOMY  1997    Social History   Socioeconomic History  . Marital status: Divorced    Spouse name: Not on file  . Number of children: 1  . Years of education: Not on file  . Highest education level: Not on file  Occupational History  . Occupation: Scientist, research (life sciences): UNEMPLOYED  Social Needs  . Financial resource strain: Not on file  . Food insecurity    Worry: Not on file    Inability: Not on file  . Transportation needs    Medical: Not on file    Non-medical: Not on file  Tobacco Use  . Smoking status: Current Every Day Smoker    Packs/day: 0.25    Years: 40.00    Pack years: 10.00    Types: Cigarettes  . Smokeless tobacco: Never Used  Substance and Sexual Activity  . Alcohol use: No  . Drug use: Yes    Frequency: 3.0 times  per week    Types: Marijuana    Comment: last use yesterday  . Sexual activity: Yes    Birth control/protection: Post-menopausal  Lifestyle  . Physical activity    Days per week: Not on file    Minutes per session: Not on file  . Stress: Not on file  Relationships  . Social Herbalist on phone: Not on file    Gets together: Not on file    Attends religious service: Not on file    Active member of club or organization: Not on file    Attends meetings of clubs or organizations: Not on file    Relationship status: Not on file  . Intimate partner violence    Fear of current or ex partner: Not on file    Emotionally abused: Not on file    Physically abused: Not on file    Forced sexual activity: Not  on file  Other Topics Concern  . Not on file  Social History Narrative  . Not on file    Review of Systems  Constitution: Negative for decreased appetite, malaise/fatigue, weight gain and weight loss.  Eyes: Negative for visual disturbance.  Cardiovascular: Positive for claudication and dyspnea on exertion. Negative for chest pain, leg swelling, orthopnea, palpitations and syncope.  Respiratory: Negative for hemoptysis and wheezing.   Endocrine: Negative for cold intolerance and heat intolerance.  Hematologic/Lymphatic: Does not bruise/bleed easily.  Skin: Negative for nail changes.  Musculoskeletal: Negative for muscle weakness and myalgias.  Gastrointestinal: Negative for abdominal pain, change in bowel habit, nausea and vomiting.  Neurological: Negative for difficulty with concentration, dizziness, focal weakness and headaches.  Psychiatric/Behavioral: Negative for altered mental status and suicidal ideas.  All other systems reviewed and are negative.     Objective:  Blood pressure (!) 156/81, pulse (!) 58, height 5\' 1"  (1.549 m), weight 116 lb 6.4 oz (52.8 kg), SpO2 98 %. Body mass index is 21.99 kg/m.    Physical Exam  Constitutional: She is oriented to person,  place, and time. Vital signs are normal. She appears well-developed and well-nourished.  HENT:  Head: Normocephalic and atraumatic.  Neck: Normal range of motion.  Cardiovascular: Normal rate, regular rhythm, normal heart sounds and intact distal pulses.  Pulses:      Carotid pulses are on the right side with bruit and on the left side with bruit.      Femoral pulses are 2+ on the right side with bruit and 2+ on the left side with bruit.      Popliteal pulses are 1+ on the right side and 1+ on the left side.       Dorsalis pedis pulses are 1+ on the right side and 1+ on the left side.       Posterior tibial pulses are 1+ on the right side and 1+ on the left side.  Pulmonary/Chest: Effort normal and breath sounds normal. No accessory muscle usage. No respiratory distress. She exhibits tenderness.  Prolonged expiration, barrel shaped chest  Abdominal: Soft. Bowel sounds are normal. She exhibits abdominal bruit.  Musculoskeletal: Normal range of motion.  Neurological: She is alert and oriented to person, place, and time.  Skin: Skin is warm and dry.  Vitals reviewed.  Radiology: No results found.  Laboratory examination:    CMP Latest Ref Rng & Units 06/05/2017 11/09/2015 11/02/2015  Glucose 65 - 99 mg/dL 68 111(H) 153(H)  BUN 6 - 20 mg/dL 16 12 23(H)  Creatinine 0.44 - 1.00 mg/dL 1.51(H) 1.11(H) 1.58(H)  Sodium 135 - 145 mmol/L 134(L) 137 134(L)  Potassium 3.5 - 5.1 mmol/L 4.5 4.8 3.6  Chloride 101 - 111 mmol/L 99(L) 107 93(L)  CO2 22 - 32 mmol/L 27 19(L) 28  Calcium 8.9 - 10.3 mg/dL 9.7 9.9 8.8(L)  Total Protein 6.5 - 8.1 g/dL - - -  Total Bilirubin 0.3 - 1.2 mg/dL - - -  Alkaline Phos 38 - 126 U/L - - -  AST 15 - 41 U/L - - -  ALT 14 - 54 U/L - - -   CBC Latest Ref Rng & Units 06/05/2017 11/09/2015 11/02/2015  WBC 4.0 - 10.5 K/uL 7.0 7.6 10.5  Hemoglobin 12.0 - 15.0 g/dL 11.6(L) 11.3(L) 9.3(L)  Hematocrit 36.0 - 46.0 % 35.9(L) 33.9(L) 29.2(L)  Platelets 150 - 400 K/uL 402(H) 523(H)  314   Lipid Panel     Component Value Date/Time   CHOL  190 10/08/2012 0228   TRIG 152 (H) 10/08/2012 0228   HDL 51 10/08/2012 0228   CHOLHDL 3.7 10/08/2012 0228   VLDL 30 10/08/2012 0228   LDLCALC 109 (H) 10/08/2012 0228   HEMOGLOBIN A1C No results found for: HGBA1C, MPG TSH No results for input(s): TSH in the last 8760 hours.  PRN Meds:. Medications Discontinued During This Encounter  Medication Reason  . clindamycin (CLEOCIN) 150 MG capsule Error   Current Meds  Medication Sig  . acetaminophen (TYLENOL) 500 MG tablet Take 1,000 mg by mouth every 8 (eight) hours as needed for mild pain or headache.  . albuterol (PROVENTIL HFA;VENTOLIN HFA) 108 (90 BASE) MCG/ACT inhaler Inhale 2 puffs into the lungs 2 (two) times daily as needed (asthma).   . ALPRAZolam (XANAX) 1 MG tablet Take 1 tablet by mouth 2 (two) times daily as needed for anxiety.   Marland Kitchen aspirin EC 81 MG tablet Take 81 mg by mouth daily.  Marland Kitchen atenolol (TENORMIN) 25 MG tablet Take by mouth daily. 1 1/2 daily  . atorvastatin (LIPITOR) 80 MG tablet Take 80 mg by mouth daily.   . Calcium Carb-Cholecalciferol (CALCIUM-VITAMIN D) 600-400 MG-UNIT TABS Take 1 tablet by mouth daily.  . clopidogrel (PLAVIX) 75 MG tablet TAKE 1 TABLET BY MOUTH ONCE DAILY  . conjugated estrogens (PREMARIN) vaginal cream Place vaginally.  . gabapentin (NEURONTIN) 400 MG capsule Take 1 capsule (400 mg total) by mouth 2 (two) times daily. Patient takes 400 mg 3 times daily and 1200 mg at bedtime (Patient taking differently: Take 400 mg by mouth at bedtime. )  . isosorbide mononitrate (IMDUR) 30 MG 24 hr tablet Take 1 tablet (30 mg total) by mouth daily.  . mometasone-formoterol (DULERA) 100-5 MCG/ACT AERO Take 2 puffs first thing in am and then another 2 puffs about 12 hours later. (Patient taking differently: Inhale 2 puffs into the lungs 2 (two) times daily. Take 2 puffs first thing in am and then another 2 puffs about 12 hours later.)  . Multiple Vitamin  (MULTIVITAMIN WITH MINERALS) TABS tablet Take 1 tablet by mouth daily.  . nitroGLYCERIN (NITROSTAT) 0.4 MG SL tablet Place 0.4 mg under the tongue every 5 (five) minutes as needed for chest pain.  . pantoprazole (PROTONIX) 40 MG tablet Take 40 mg by mouth daily.  Marland Kitchen venlafaxine XR (EFFEXOR XR) 150 MG 24 hr capsule Take 1 capsule (150 mg total) by mouth daily with breakfast. (Patient taking differently: Take 300 mg by mouth daily with breakfast. )  . vitamin B-12 (CYANOCOBALAMIN) 500 MCG tablet Take 500 mcg by mouth daily.    Cardiac Studies:   Lower extremity arterial duplex 12/05/2018: No hemodynamically significant stenoses are identified in the lower extremity arterial system.  This exam reveals moderately decreased perfusion of the right lower extremity, noted at the dorsalis pedis and post tibial artery level (ABI 0.68) and mildly decreased perfusion of the left lower extremity, noted at the post tibial artery level (ABI 0.83). Triphasic waveform the the major vessels suggest diffuse small vessel disease. Left proximal SFA has < 50% stenosis.  208/06/2016, mild progression of PAD with decreased ABI bilaterally compared to left ABI 0.95 and right ABI 0.94.  Echocardiogram 02/26/2017: Left ventricle cavity is normal in size. Normal global wall motion. Normal diastolic filling pattern. Calculated EF 63%. Left atrial cavity is mild to moderately dilated by volume. Borderline prolapse of the posterior MV leaflet with mild anteriorly directed MR. Mild tricuspid regurgitation. No evidence of pulmonary hypertension. Compared to hospital  Echocardiogram 07/02/2014: Moderate mitral regurgitation and moderate pulmonary hypertension, PA pressure estimated to be 59 mmHg no longer present.  Lexiscan myoview stress test 02/17/2018: 1. Lexiscan stress test was performed. Exercise capacity was not assessed. Stress symptoms included dizziness, nausea, headache, and chest pressure.. Peak blood pressure was  176/88 mmHg. Stress EKG is non diagnostic for ischemia as it is a pharmacologic stress. In addition, it showed The stress electrocardiogram showed sinus tachycardia, normal stress conduction, left ventricular hypertrophy, no stress arrhythmias and normal stress repolarization. 2. The overall quality of the study is excellent. Left ventricular cavity is noted to be normal on the rest and stress studies. Gated SPECT images reveal normal myocardial thickening and wall motion. The left ventricular ejection fraction was calculated or visually estimated to be 78%. SPECT images demonstrate small perfusion abnormality of moderate intensity in the mid anterolateral and apical lateral myocardial wall(s) on the stress images, reversible on rest images. Findings suggest small area of moderate intensity ischemia in mid to apical anterolateral myocardium. 3. Intermediate risk study.  03/25/2018-cardiac cath.-normal coronary arteries.  abdominal aortogram 10/29/2017: Widely patent renal arteries. Distal abdominal aorta diffuse disease, calcification with 80% stenosis S/P 10 x 29 mm Omnilink Elite stent, 80% to 0%.  Renal arteriogram 02/29/2018: Widely patient stents. 04/10/2016: Scoring balloon angioplasty left in-stent restenosis with 6 x 20 mm angiosculpt balloon.  Right renal artery stent widely patent. H/O bilateral renal stenting on 09/07/2014: Right renal artery 6.0 x 15 mm Herculink widely patent, left renal artery  6.0 x 18 mm Herculink stent.  Renal artery duplex 09/18/2017: Hemodynamically significant stenosis of the right renal artery. Peak Velocity of 308/66 cm/s. Normal intrarenal vascular perfusion is noted in both kidneys. There is increased echogenecity of both kidneys suggests medico-renal disease with right kidney slightly shrunk at 8.03x3.97x4 cm compared to 9.144.664.6 cm Diffuse plaque noted in the abdominal aorta with calcification.  Compared to the study done on 01/22/2017, left renal artery  stenosis not evident, right renal artery stenosis new.  Patient has h/o bilateral renal artery stenting.  Assessment:     ICD-10-CM   1. Angina pectoris (HCC)  I20.9   2. Peripheral arterial disease (HCC)  I73.9   3. Tobacco use disorder  F17.200   4. Essential hypertension  I10     EKG 03/11/2019: Sinus bradycardia at 53 bpm, left atrial enlargement, normal axis, no evidence of ischemia.   Recommendations:   Chest pain has significantly improved with addition of Imdur, will continue the same. Continue to feel angina symptoms related to microvascular disease. If she has recurrence of chest pain, will consider trial of Ranexa.  Blood pressure is elevated today, she has been taking 11/2 of atenolol. I will increase atenolol to 25 mg twice a day. She will start home monitoring as well. She has previously had issues with orthostatic hypotension.   I had ordered aortic duplex a few months ago; however, was cancelled in view of COVID. Will have this rescheduled for surviellance of PAD and due to history of abdominal aortic stenosis. Claudication symptoms are stable. She will continue to need aggressive risk factor modifications particularly with smoking cessation. She is working to quit on her own at this time. I will see her back in 2-3 months for follow up.   Miquel Dunn, MSN, APRN, FNP-C [facility] Cardiovascular. [place] Office: [phone] Fax: [phone]

## 2019-03-31 ENCOUNTER — Encounter: Payer: Self-pay | Admitting: Cardiology

## 2019-03-31 ENCOUNTER — Other Ambulatory Visit: Payer: Self-pay

## 2019-03-31 ENCOUNTER — Ambulatory Visit (INDEPENDENT_AMBULATORY_CARE_PROVIDER_SITE_OTHER): Payer: Medicare Other | Admitting: Cardiology

## 2019-03-31 VITALS — BP 156/81 | HR 58 | Ht 61.0 in | Wt 116.4 lb

## 2019-03-31 DIAGNOSIS — F17200 Nicotine dependence, unspecified, uncomplicated: Secondary | ICD-10-CM | POA: Diagnosis not present

## 2019-03-31 DIAGNOSIS — I10 Essential (primary) hypertension: Secondary | ICD-10-CM

## 2019-03-31 DIAGNOSIS — I739 Peripheral vascular disease, unspecified: Secondary | ICD-10-CM

## 2019-03-31 DIAGNOSIS — I209 Angina pectoris, unspecified: Secondary | ICD-10-CM | POA: Diagnosis not present

## 2019-03-31 MED ORDER — ATENOLOL 25 MG PO TABS: 25.0000 mg | ORAL_TABLET | Freq: Two times a day (BID) | ORAL | 1 refills | Status: DC

## 2019-04-17 ENCOUNTER — Ambulatory Visit (INDEPENDENT_AMBULATORY_CARE_PROVIDER_SITE_OTHER): Payer: Medicare Other

## 2019-04-17 ENCOUNTER — Other Ambulatory Visit: Payer: Self-pay

## 2019-04-17 DIAGNOSIS — R0989 Other specified symptoms and signs involving the circulatory and respiratory systems: Secondary | ICD-10-CM | POA: Diagnosis not present

## 2019-04-17 DIAGNOSIS — I6522 Occlusion and stenosis of left carotid artery: Secondary | ICD-10-CM

## 2019-05-13 ENCOUNTER — Other Ambulatory Visit: Payer: Medicare Other

## 2019-05-19 ENCOUNTER — Other Ambulatory Visit: Payer: Self-pay | Admitting: Cardiology

## 2019-05-27 ENCOUNTER — Other Ambulatory Visit: Payer: Self-pay

## 2019-05-27 ENCOUNTER — Ambulatory Visit (INDEPENDENT_AMBULATORY_CARE_PROVIDER_SITE_OTHER): Payer: Medicare Other

## 2019-05-27 DIAGNOSIS — Q251 Coarctation of aorta: Secondary | ICD-10-CM | POA: Diagnosis not present

## 2019-06-02 ENCOUNTER — Ambulatory Visit: Payer: Medicare Other | Admitting: Cardiology

## 2019-06-03 ENCOUNTER — Ambulatory Visit: Payer: Medicare Other | Admitting: Cardiology

## 2019-06-03 NOTE — Progress Notes (Deleted)
Primary Physician:  Bernerd Limbo, MD   [name], female    DOB: Jan 03, 1961, 58 y.o.   MRN: [id number]  Subjective:    No chief complaint on file.   HPI: [name]  is a 58 y.o. female  with hypertension, COPD, diastolic CHF, ongoing tobacco abuse, and difficult to control hypertension with CKD, with bilateral intrarenal angioplasty in 2015 and again underwent renal angiography in 2017 that showed  restenosis of the left renal artery and underwent scoring balloon angioplasty with 6.020 mm angiosculpt balloon.She underwent renal duplex scan for surveillance that showed restenosis in the right renal artery with high-grade velocities. She underwent repeat renal and abdominal angiogram on 10/29/2017 that showed widely patent renal arteries, but distal abdominal aorta stenosis with diffuse disease, calcification was present for which she underwent stenting with 10x 29 mm Omnilink Elite stent.  Recently seen for symptoms of angina felt to be related to microvascular disease, she was started on Imdur 30 mg daily. Symptoms have improved with this. She underwent abdominal aortic duplex and now presents for follow up.  Reports significant improvement in chest pain with addition of Imdur. She has been taking 1 1/2 of atenolol due to high BP. Overall feeling well. She underwent coronary angiogram in June 2019 for nitroglycerin responsive chest pain that revealed normal coronary arteries.   Continues to smoke 1/2 PPD.  She also has chronic back pain. She has been told to have severe CKD; however, on last check, kidney function is normal. Patient continues to feel that she needs to see nephrologist as she has bilateral flank pain with standing for prolonged periods.   Past Medical History:  Diagnosis Date  . Anemia 2008  . Anxiety   . Aortic stenosis   . Arthritis    "all my joints ache" (09/07/2014)  . Asthma   . Bipolar 1 disorder ([place])   . Bradycardia   . Bruit    . Carpal tunnel syndrome   . Cervical cancer ([place]) 1985  . CHF (congestive heart failure) ([place])   . Chronic kidney disease (CKD), stage V ([place])   . COPD (chronic obstructive pulmonary disease) ([place]) 2000  . Coronary artery disease   . Daily headache   . Depression 2000  . Diverticulitis 2008  . GERD (gastroesophageal reflux disease)   . GERD (gastroesophageal reflux disease)   . Glaucoma   . Heart murmur   . History of colon polyps 2009  . HLD (hyperlipidemia) 2013  . Hypertension 2013  . Hypovitaminosis D   . IBS (irritable bowel syndrome) 2008  . Myocardial infarction ([place])    2015  . PAD (peripheral artery disease) ([place])   . Pancreatitis 10/2011  . Pneumonia 07/2014  . RLS (restless legs syndrome)   . Schizophrenia ([place])   . Shortness of breath   . Skin cancer   . Small bowel obstruction ([place]) 2008    Past Surgical History:  Procedure Laterality Date  . ABDOMINAL ANGIOGRAM N/A 09/07/2014   Procedure: ABDOMINAL ANGIOGRAM;  Surgeon: Laverda Page, MD;  Location: [facility] CATH LAB;  Service: Cardiovascular;  Laterality: N/A;  . APPENDECTOMY  1996  . COLON SURGERY  2008   6 inches of colon removed due to obstruction  . COLONOSCOPY WITH PROPOFOL N/A 10/25/2016   Procedure: COLONOSCOPY WITH PROPOFOL;  Surgeon: Milus Banister, MD;  Location: [facility] ENDOSCOPY;  Service: Endoscopy;  Laterality: N/A;  . CORONARY ANGIOPLASTY WITH STENT PLACEMENT  09/07/2014   "2"  .  LEFT HEART CATH AND CORONARY ANGIOGRAPHY N/A 03/25/2018   Procedure: LEFT HEART CATH AND CORONARY ANGIOGRAPHY;  Surgeon: Nigel Mormon, MD;  Location: [place] CV LAB;  Service: Cardiovascular;  Laterality: N/A;  . LEFT HEART CATHETERIZATION WITH CORONARY ANGIOGRAM N/A 09/07/2014   Procedure: LEFT HEART CATHETERIZATION WITH CORONARY ANGIOGRAM;  Surgeon: Laverda Page, MD;  Location: [facility] CATH LAB;  Service: Cardiovascular;  Laterality: N/A;  . LOWER EXTREMITY ANGIOGRAPHY  10/29/2017   Procedure: Lower Extremity  Angiography;  Surgeon: Adrian Prows, MD;  Location: [place] CV LAB;  Service: Cardiovascular;;  . PERIPHERAL VASCULAR CATHETERIZATION N/A 11/01/2015   Procedure: Renal Angiography;  Surgeon: Adrian Prows, MD;  Location: [place] CV LAB;  Service: Cardiovascular;  Laterality: N/A;  . PERIPHERAL VASCULAR CATHETERIZATION N/A 04/10/2016   Procedure: Renal Angiography;  Surgeon: Adrian Prows, MD;  Location: [place] CV LAB;  Service: Cardiovascular;  Laterality: N/A;  . PERIPHERAL VASCULAR CATHETERIZATION  04/10/2016   Procedure: Peripheral Vascular Intervention;  Surgeon: Adrian Prows, MD;  Location: [place] CV LAB;  Service: Cardiovascular;;  . PERIPHERAL VASCULAR INTERVENTION  10/29/2017   Procedure: PERIPHERAL VASCULAR INTERVENTION;  Surgeon: Adrian Prows, MD;  Location: [place] CV LAB;  Service: Cardiovascular;;  . RENAL ANGIOGRAPHY N/A 10/29/2017   Procedure: RENAL ANGIOGRAPHY;  Surgeon: Adrian Prows, MD;  Location: [place] CV LAB;  Service: Cardiovascular;  Laterality: N/A;  . RIGHT OOPHORECTOMY Right 1996  . TOTAL ABDOMINAL HYSTERECTOMY  1997    Social History   Socioeconomic History  . Marital status: Divorced    Spouse name: Not on file  . Number of children: 1  . Years of education: Not on file  . Highest education level: Not on file  Occupational History  . Occupation: Scientist, research (life sciences): UNEMPLOYED  Social Needs  . Financial resource strain: Not on file  . Food insecurity    Worry: Not on file    Inability: Not on file  . Transportation needs    Medical: Not on file    Non-medical: Not on file  Tobacco Use  . Smoking status: Current Every Day Smoker    Packs/day: 0.25    Years: 40.00    Pack years: 10.00    Types: Cigarettes  . Smokeless tobacco: Never Used  Substance and Sexual Activity  . Alcohol use: No  . Drug use: Yes    Frequency: 3.0 times per week    Types: Marijuana    Comment: last use yesterday  . Sexual activity: Yes    Birth  control/protection: Post-menopausal  Lifestyle  . Physical activity    Days per week: Not on file    Minutes per session: Not on file  . Stress: Not on file  Relationships  . Social Herbalist on phone: Not on file    Gets together: Not on file    Attends religious service: Not on file    Active member of club or organization: Not on file    Attends meetings of clubs or organizations: Not on file    Relationship status: Not on file  . Intimate partner violence    Fear of current or ex partner: Not on file    Emotionally abused: Not on file    Physically abused: Not on file    Forced sexual activity: Not on file  Other Topics Concern  . Not on file  Social History Narrative  . Not on file    Review of  Systems  Constitution: Negative for decreased appetite, malaise/fatigue, weight gain and weight loss.  Eyes: Negative for visual disturbance.  Cardiovascular: Positive for claudication and dyspnea on exertion. Negative for chest pain, leg swelling, orthopnea, palpitations and syncope.  Respiratory: Negative for hemoptysis and wheezing.   Endocrine: Negative for cold intolerance and heat intolerance.  Hematologic/Lymphatic: Does not bruise/bleed easily.  Skin: Negative for nail changes.  Musculoskeletal: Negative for muscle weakness and myalgias.  Gastrointestinal: Negative for abdominal pain, change in bowel habit, nausea and vomiting.  Neurological: Negative for difficulty with concentration, dizziness, focal weakness and headaches.  Psychiatric/Behavioral: Negative for altered mental status and suicidal ideas.  All other systems reviewed and are negative.     Objective:  There were no vitals taken for this visit. There is no height or weight on file to calculate BMI.    Physical Exam  Constitutional: She is oriented to person, place, and time. Vital signs are normal. She appears well-developed and well-nourished.  HENT:  Head: Normocephalic and atraumatic.   Neck: Normal range of motion.  Cardiovascular: Normal rate, regular rhythm, normal heart sounds and intact distal pulses.  Pulses:      Carotid pulses are on the right side with bruit and on the left side with bruit.      Femoral pulses are 2+ on the right side with bruit and 2+ on the left side with bruit.      Popliteal pulses are 1+ on the right side and 1+ on the left side.       Dorsalis pedis pulses are 1+ on the right side and 1+ on the left side.       Posterior tibial pulses are 1+ on the right side and 1+ on the left side.  Pulmonary/Chest: Effort normal and breath sounds normal. No accessory muscle usage. No respiratory distress. She exhibits tenderness.  Prolonged expiration, barrel shaped chest  Abdominal: Soft. Bowel sounds are normal. She exhibits abdominal bruit.  Musculoskeletal: Normal range of motion.  Neurological: She is alert and oriented to person, place, and time.  Skin: Skin is warm and dry.  Vitals reviewed.  Radiology: No results found.  Laboratory examination:    CMP Latest Ref Rng & Units 06/05/2017 11/09/2015 11/02/2015  Glucose 65 - 99 mg/dL 68 111(H) 153(H)  BUN 6 - 20 mg/dL 16 12 23(H)  Creatinine 0.44 - 1.00 mg/dL 1.51(H) 1.11(H) 1.58(H)  Sodium 135 - 145 mmol/L 134(L) 137 134(L)  Potassium 3.5 - 5.1 mmol/L 4.5 4.8 3.6  Chloride 101 - 111 mmol/L 99(L) 107 93(L)  CO2 22 - 32 mmol/L 27 19(L) 28  Calcium 8.9 - 10.3 mg/dL 9.7 9.9 8.8(L)  Total Protein 6.5 - 8.1 g/dL - - -  Total Bilirubin 0.3 - 1.2 mg/dL - - -  Alkaline Phos 38 - 126 U/L - - -  AST 15 - 41 U/L - - -  ALT 14 - 54 U/L - - -   CBC Latest Ref Rng & Units 06/05/2017 11/09/2015 11/02/2015  WBC 4.0 - 10.5 K/uL 7.0 7.6 10.5  Hemoglobin 12.0 - 15.0 g/dL 11.6(L) 11.3(L) 9.3(L)  Hematocrit 36.0 - 46.0 % 35.9(L) 33.9(L) 29.2(L)  Platelets 150 - 400 K/uL 402(H) 523(H) 314   Lipid Panel     Component Value Date/Time   CHOL 190 10/08/2012 0228   TRIG 152 (H) 10/08/2012 0228   HDL 51 10/08/2012 0228    CHOLHDL 3.7 10/08/2012 0228   VLDL 30 10/08/2012 0228   LDLCALC 109 (H) 10/08/2012  0228   HEMOGLOBIN A1C No results found for: HGBA1C, MPG TSH No results for input(s): TSH in the last 8760 hours.  PRN Meds:. There are no discontinued medications. No outpatient medications have been marked as taking for the 06/03/19 encounter (Appointment) with Miquel Dunn, NP.    Cardiac Studies:   Abdominal Aortic Duplex  05/27/2019: Moderate plaque noted in the proximal, mid and distal aorta.  No abdominal aortic aneurysm. Distal abdominal aortic velocity is mildly elevated suggestive of <50% stenosis. Patient has h/o distal abdominal aortic stenosis and stenting on 10/29/2017. The stent appears patent.   Lower extremity arterial duplex 12/05/2018: No hemodynamically significant stenoses are identified in the lower extremity arterial system.  This exam reveals moderately decreased perfusion of the right lower extremity, noted at the dorsalis pedis and post tibial artery level (ABI 0.68) and mildly decreased perfusion of the left lower extremity, noted at the post tibial artery level (ABI 0.83). Triphasic waveform the the major vessels suggest diffuse small vessel disease. Left proximal SFA has < 50% stenosis.  208/06/2016, mild progression of PAD with decreased ABI bilaterally compared to left ABI 0.95 and right ABI 0.94.  Echocardiogram 02/26/2017: Left ventricle cavity is normal in size. Normal global wall motion. Normal diastolic filling pattern. Calculated EF 63%. Left atrial cavity is mild to moderately dilated by volume. Borderline prolapse of the posterior MV leaflet with mild anteriorly directed MR. Mild tricuspid regurgitation. No evidence of pulmonary hypertension. Compared to hospital Echocardiogram 07/02/2014: Moderate mitral regurgitation and moderate pulmonary hypertension, PA pressure estimated to be 59 mmHg no longer present.  Lexiscan myoview stress test 02/17/2018: 1.  Lexiscan stress test was performed. Exercise capacity was not assessed. Stress symptoms included dizziness, nausea, headache, and chest pressure.. Peak blood pressure was 176/88 mmHg. Stress EKG is non diagnostic for ischemia as it is a pharmacologic stress. In addition, it showed The stress electrocardiogram showed sinus tachycardia, normal stress conduction, left ventricular hypertrophy, no stress arrhythmias and normal stress repolarization. 2. The overall quality of the study is excellent. Left ventricular cavity is noted to be normal on the rest and stress studies. Gated SPECT images reveal normal myocardial thickening and wall motion. The left ventricular ejection fraction was calculated or visually estimated to be 78%. SPECT images demonstrate small perfusion abnormality of moderate intensity in the mid anterolateral and apical lateral myocardial wall(s) on the stress images, reversible on rest images. Findings suggest small area of moderate intensity ischemia in mid to apical anterolateral myocardium. 3. Intermediate risk study.  03/25/2018-cardiac cath.-normal coronary arteries.  abdominal aortogram 10/29/2017: Widely patent renal arteries. Distal abdominal aorta diffuse disease, calcification with 80% stenosis S/P 10 x 29 mm Omnilink Elite stent, 80% to 0%.  Renal arteriogram 02/29/2018: Widely patient stents. 04/10/2016: Scoring balloon angioplasty left in-stent restenosis with 6 x 20 mm angiosculpt balloon.  Right renal artery stent widely patent. H/O bilateral renal stenting on 09/07/2014: Right renal artery 6.0 x 15 mm Herculink widely patent, left renal artery  6.0 x 18 mm Herculink stent.  Renal artery duplex 09/18/2017: Hemodynamically significant stenosis of the right renal artery. Peak Velocity of 308/66 cm/s. Normal intrarenal vascular perfusion is noted in both kidneys. There is increased echogenecity of both kidneys suggests medico-renal disease with right kidney slightly shrunk at  8.03x3.97x4 cm compared to 9.144.664.6 cm Diffuse plaque noted in the abdominal aorta with calcification.  Compared to the study done on 01/22/2017, left renal artery stenosis not evident, right renal artery stenosis new.  Patient has h/o  bilateral renal artery stenting.  Assessment:   No diagnosis found.  EKG 03/11/2019: Sinus bradycardia at 53 bpm, left atrial enlargement, normal axis, no evidence of ischemia.   Recommendations:   Chest pain has significantly improved with addition of Imdur, will continue the same. Continue to feel angina symptoms related to microvascular disease. If she has recurrence of chest pain, will consider trial of Ranexa.  Blood pressure is elevated today, she has been taking 11/2 of atenolol. I will increase atenolol to 25 mg twice a day. She will start home monitoring as well. She has previously had issues with orthostatic hypotension.   I had ordered aortic duplex a few months ago; however, was cancelled in view of COVID. Will have this rescheduled for surviellance of PAD and due to history of abdominal aortic stenosis. Claudication symptoms are stable. She will continue to need aggressive risk factor modifications particularly with smoking cessation. She is working to quit on her own at this time. I will see her back in 2-3 months for follow up.   Miquel Dunn, MSN, APRN, FNP-C [facility]. [place] Office: [phone] Fax: [phone]

## 2019-07-01 ENCOUNTER — Ambulatory Visit: Payer: Medicare Other | Admitting: Cardiology

## 2019-07-02 ENCOUNTER — Ambulatory Visit (INDEPENDENT_AMBULATORY_CARE_PROVIDER_SITE_OTHER): Payer: Medicare Other | Admitting: Cardiology

## 2019-07-02 ENCOUNTER — Encounter: Payer: Self-pay | Admitting: Cardiology

## 2019-07-02 ENCOUNTER — Other Ambulatory Visit: Payer: Self-pay

## 2019-07-02 VITALS — BP 134/68 | HR 56 | Temp 98.0°F | Ht 61.75 in | Wt 113.8 lb

## 2019-07-02 DIAGNOSIS — R0789 Other chest pain: Secondary | ICD-10-CM

## 2019-07-02 DIAGNOSIS — I209 Angina pectoris, unspecified: Secondary | ICD-10-CM

## 2019-07-02 DIAGNOSIS — I739 Peripheral vascular disease, unspecified: Secondary | ICD-10-CM

## 2019-07-02 DIAGNOSIS — F17200 Nicotine dependence, unspecified, uncomplicated: Secondary | ICD-10-CM

## 2019-07-02 DIAGNOSIS — I10 Essential (primary) hypertension: Secondary | ICD-10-CM

## 2019-07-02 MED ORDER — ISOSORBIDE MONONITRATE ER 60 MG PO TB24: 60.0000 mg | ORAL_TABLET | Freq: Every day | ORAL | 3 refills | Status: DC

## 2019-07-02 NOTE — Progress Notes (Signed)
Primary Physician:  Bernerd Limbo, MD   [name], female    DOB: 1961/08/22, 58 y.o.   MRN: [id number]  Subjective:    Chief Complaint  Patient presents with   Follow-up   Chest Pain   Hypertension    HPI: [name]  is a 58 y.o. female  with hypertension, COPD, diastolic CHF, ongoing tobacco abuse, and difficult to control hypertension with CKD, with bilateral intrarenal angioplasty in 2015 and again underwent renal angiography in 2017 that showed  restenosis of the left renal artery and underwent scoring balloon angioplasty with 6.020 mm angiosculpt balloon.She underwent renal duplex scan for surveillance that showed restenosis in the right renal artery with high-grade velocities. She underwent repeat renal and abdominal angiogram on 10/29/2017 that showed widely patent renal arteries, but distal abdominal aorta stenosis with diffuse disease, calcification was present for which she underwent stenting with 10x 29 mm Omnilink Elite stent.  She underwent abdominal aortic duplex and now presents for follow up.  Patient reports that she is not feeling well for last few weeks, under significant amount of stress surrounding her break-up with her long-term boyfriend and stress with upcoming move.  She has since been having chest discomfort that is fairly persistent, but does have more intense episodes that resolve with nitroglycerin.  States her chest pain is exacerbated by lifting heavy objects or certain positions.  Also worsened with deep breathing.  She had been seen a few months ago for angina and initially had significant improvement in chest pain with addition of Imdur. She has been taking 1 1/2 of atenolol due to high BP. Overall feeling well. She underwent coronary angiogram in June 2019 for nitroglycerin responsive chest pain that revealed normal coronary arteries.   Continues to smoke 1/2 PPD, but slightly more.  She also has chronic back pain. She  has been told to have severe CKD; however, on last check, kidney function is normal. Patient continues to feel that she needs to see nephrologist as she has bilateral flank pain with standing for prolonged periods.   Past Medical History:  Diagnosis Date   Anemia 2008   Anxiety    Aortic stenosis    Arthritis    "all my joints ache" (09/07/2014)   Asthma    Bipolar 1 disorder (HCC)    Bradycardia    Bruit    Carpal tunnel syndrome    Cervical cancer (HCC) 1985   CHF (congestive heart failure) (HCC)    Chronic kidney disease (CKD), stage V (HCC)    COPD (chronic obstructive pulmonary disease) ([place]) 2000   Coronary artery disease    Daily headache    Depression 2000   Diverticulitis 2008   GERD (gastroesophageal reflux disease)    GERD (gastroesophageal reflux disease)    Glaucoma    Heart murmur    History of colon polyps 2009   HLD (hyperlipidemia) 2013   Hypertension 2013   Hypovitaminosis D    IBS (irritable bowel syndrome) 2008   Myocardial infarction [facility])    2015   PAD (peripheral artery disease) ([place])    Pancreatitis 10/2011   Pneumonia 07/2014   RLS (restless legs syndrome)    Schizophrenia (HCC)    Shortness of breath    Skin cancer    Small bowel obstruction ([place]) 2008    Past Surgical History:  Procedure Laterality Date   ABDOMINAL ANGIOGRAM N/A 09/07/2014   Procedure: ABDOMINAL ANGIOGRAM;  Surgeon: Laverda Page, MD;  Location: [place] CATH LAB;  Service: Cardiovascular;  Laterality: N/A;   APPENDECTOMY  1996   COLON SURGERY  2008   6 inches of colon removed due to obstruction   COLONOSCOPY WITH PROPOFOL N/A 10/25/2016   Procedure: COLONOSCOPY WITH PROPOFOL;  Surgeon: Milus Banister, MD;  Location: [facility] ENDOSCOPY;  Service: Endoscopy;  Laterality: N/A;   CORONARY ANGIOPLASTY WITH STENT PLACEMENT  09/07/2014   "2"   LEFT HEART CATH AND CORONARY ANGIOGRAPHY N/A 03/25/2018   Procedure: LEFT HEART CATH AND CORONARY  ANGIOGRAPHY;  Surgeon: Nigel Mormon, MD;  Location: [place] CV LAB;  Service: Cardiovascular;  Laterality: N/A;   LEFT HEART CATHETERIZATION WITH CORONARY ANGIOGRAM N/A 09/07/2014   Procedure: LEFT HEART CATHETERIZATION WITH CORONARY ANGIOGRAM;  Surgeon: Laverda Page, MD;  Location: [facility] C[facility] LAB;  Service: Cardiovascular;  Laterality: N/A;   LOWER EXTREMITY ANGIOGRAPHY  10/29/2017   Procedure: Lower Extremity Angiography;  Surgeon: Adrian Prows, MD;  Location: [place] CV LAB;  Service: Cardiovascular;;   PERIPHERAL VASCULAR CATHETERIZATION N/A 11/01/2015   Procedure: Renal Angiography;  Surgeon: Adrian Prows, MD;  Location: [place] CV LAB;  Service: Cardiovascular;  Laterality: N/A;   PERIPHERAL VASCULAR CATHETERIZATION N/A 04/10/2016   Procedure: Renal Angiography;  Surgeon: Adrian Prows, MD;  Location: [place] CV LAB;  Service: Cardiovascular;  Laterality: N/A;   PERIPHERAL VASCULAR CATHETERIZATION  04/10/2016   Procedure: Peripheral Vascular Intervention;  Surgeon: Adrian Prows, MD;  Location: [place] CV LAB;  Service: Cardiovascular;;   PERIPHERAL VASCULAR INTERVENTION  10/29/2017   Procedure: PERIPHERAL VASCULAR INTERVENTION;  Surgeon: Adrian Prows, MD;  Location: [place] CV LAB;  Service: Cardiovascular;;   RENAL ANGIOGRAPHY N/A 10/29/2017   Procedure: RENAL ANGIOGRAPHY;  Surgeon: Adrian Prows, MD;  Location: [place] CV LAB;  Service: Cardiovascular;  Laterality: N/A;   RIGHT OOPHORECTOMY Right 1996   TOTAL ABDOMINAL HYSTERECTOMY  1997    Social History   Socioeconomic History   Marital status: Divorced    Spouse name: Not on file   Number of children: 1   Years of education: Not on file   Highest education level: Not on file  Occupational History   Occupation: Scientist, research (life sciences): UNEMPLOYED  Social Designer, fashion/clothing strain: Not on file   Food insecurity    Worry: Not on file    Inability: Not on file   Transportation  needs    Medical: Not on file    Non-medical: Not on file  Tobacco Use   Smoking status: Current Every Day Smoker    Packs/day: 0.25    Years: 40.00    Pack years: 10.00    Types: Cigarettes   Smokeless tobacco: Never Used  Substance and Sexual Activity   Alcohol use: No   Drug use: Yes    Frequency: 3.0 times per week    Types: Marijuana    Comment: last use yesterday   Sexual activity: Yes    Birth control/protection: Post-menopausal  Lifestyle   Physical activity    Days per week: Not on file    Minutes per session: Not on file   Stress: Not on file  Relationships   Social connections    Talks on phone: Not on file    Gets together: Not on file    Attends religious service: Not on file    Active member of club or organization: Not on file    Attends meetings of clubs or organizations: Not on  file    Relationship status: Not on file   Intimate partner violence    Fear of current or ex partner: Not on file    Emotionally abused: Not on file    Physically abused: Not on file    Forced sexual activity: Not on file  Other Topics Concern   Not on file  Social History Narrative   Not on file    Review of Systems  Constitution: Negative for decreased appetite, malaise/fatigue, weight gain and weight loss.  Eyes: Negative for visual disturbance.  Cardiovascular: Positive for chest pain, claudication and dyspnea on exertion. Negative for leg swelling, orthopnea, palpitations and syncope.  Respiratory: Negative for hemoptysis and wheezing.   Endocrine: Negative for cold intolerance and heat intolerance.  Hematologic/Lymphatic: Does not bruise/bleed easily.  Skin: Negative for nail changes.  Musculoskeletal: Negative for muscle weakness and myalgias.  Gastrointestinal: Negative for abdominal pain, change in bowel habit, nausea and vomiting.  Neurological: Negative for difficulty with concentration, dizziness, focal weakness and headaches.    Psychiatric/Behavioral: Positive for depression. Negative for altered mental status and suicidal ideas.  All other systems reviewed and are negative.     Objective:  Blood pressure 134/68, pulse (!) 56, temperature 98 F (36.7 C), height 5' 1.75" (1.568 m), weight 113 lb 12.8 oz (51.6 kg), SpO2 99 %. Body mass index is 20.98 kg/m.    Physical Exam  Constitutional: She is oriented to person, place, and time. Vital signs are normal. She appears well-developed and well-nourished.  HENT:  Head: Normocephalic and atraumatic.  Neck: Normal range of motion.  Cardiovascular: Normal rate, regular rhythm, normal heart sounds and intact distal pulses.  Pulses:      Carotid pulses are on the right side with bruit and on the left side with bruit.      Femoral pulses are 2+ on the right side with bruit and 2+ on the left side with bruit.      Popliteal pulses are 1+ on the right side and 1+ on the left side.       Dorsalis pedis pulses are 1+ on the right side and 1+ on the left side.       Posterior tibial pulses are 1+ on the right side and 1+ on the left side.  Pulmonary/Chest: Effort normal and breath sounds normal. No accessory muscle usage. No respiratory distress. She exhibits tenderness.  Prolonged expiration, barrel shaped chest  Abdominal: Soft. Bowel sounds are normal. She exhibits abdominal bruit.  Musculoskeletal: Normal range of motion.  Neurological: She is alert and oriented to person, place, and time.  Skin: Skin is warm and dry.  Vitals reviewed.  Radiology: No results found.  Laboratory examination:    CMP Latest Ref Rng & Units 06/05/2017 11/09/2015 11/02/2015  Glucose 65 - 99 mg/dL 68 111(H) 153(H)  BUN 6 - 20 mg/dL 16 12 23(H)  Creatinine 0.44 - 1.00 mg/dL 1.51(H) 1.11(H) 1.58(H)  Sodium 135 - 145 mmol/L 134(L) 137 134(L)  Potassium 3.5 - 5.1 mmol/L 4.5 4.8 3.6  Chloride 101 - 111 mmol/L 99(L) 107 93(L)  CO2 22 - 32 mmol/L 27 19(L) 28  Calcium 8.9 - 10.3 mg/dL 9.7 9.9  8.8(L)  Total Protein 6.5 - 8.1 g/dL - - -  Total Bilirubin 0.3 - 1.2 mg/dL - - -  Alkaline Phos 38 - 126 U/L - - -  AST 15 - 41 U/L - - -  ALT 14 - 54 U/L - - -   CBC Latest  Ref Rng & Units 06/05/2017 11/09/2015 11/02/2015  WBC 4.0 - 10.5 K/uL 7.0 7.6 10.5  Hemoglobin 12.0 - 15.0 g/dL 11.6(L) 11.3(L) 9.3(L)  Hematocrit 36.0 - 46.0 % 35.9(L) 33.9(L) 29.2(L)  Platelets 150 - 400 K/uL 402(H) 523(H) 314   Lipid Panel     Component Value Date/Time   CHOL 190 10/08/2012 0228   TRIG 152 (H) 10/08/2012 0228   HDL 51 10/08/2012 0228   CHOLHDL 3.7 10/08/2012 0228   VLDL 30 10/08/2012 0228   LDLCALC 109 (H) 10/08/2012 0228   HEMOGLOBIN A1C No results found for: HGBA1C, MPG TSH No results for input(s): TSH in the last 8760 hours.  PRN Meds:. Medications Discontinued During This Encounter  Medication Reason   isosorbide mononitrate (IMDUR) 30 MG 24 hr tablet Dose change   Current Meds  Medication Sig   acetaminophen (TYLENOL) 500 MG tablet Take 1,000 mg by mouth every 8 (eight) hours as needed for mild pain or headache.   albuterol (PROVENTIL HFA;VENTOLIN HFA) 108 (90 BASE) MCG/ACT inhaler Inhale 2 puffs into the lungs 2 (two) times daily as needed (asthma).    ALPRAZolam (XANAX) 1 MG tablet Take 1 tablet by mouth 2 (two) times daily as needed for anxiety.    aspirin EC 81 MG tablet Take 81 mg by mouth daily.   atenolol (TENORMIN) 25 MG tablet Take 1 tablet (25 mg total) by mouth 2 (two) times daily.   atorvastatin (LIPITOR) 80 MG tablet Take 80 mg by mouth daily.    azelastine (ASTELIN) 0.1 % nasal spray Place 1 spray into both nostrils daily at 2 PM.   Calcium Carb-Cholecalciferol (CALCIUM-VITAMIN D) 600-400 MG-UNIT TABS Take 1 tablet by mouth daily.   cetirizine (ZYRTEC) 10 MG tablet Take 1 tablet by mouth daily at 2 PM.   clopidogrel (PLAVIX) 75 MG tablet TAKE 1 TABLET BY MOUTH ONCE DAILY   conjugated estrogens (PREMARIN) vaginal cream Place vaginally.   gabapentin  (NEURONTIN) 400 MG capsule Take 1 capsule (400 mg total) by mouth 2 (two) times daily. Patient takes 400 mg 3 times daily and 1200 mg at bedtime (Patient taking differently: Take 400 mg by mouth at bedtime. )   mometasone-formoterol (DULERA) 100-5 MCG/ACT AERO Take 2 puffs first thing in am and then another 2 puffs about 12 hours later. (Patient taking differently: Inhale 2 puffs into the lungs 2 (two) times daily. Take 2 puffs first thing in am and then another 2 puffs about 12 hours later.)   Multiple Vitamin (MULTIVITAMIN WITH MINERALS) TABS tablet Take 1 tablet by mouth daily.   nitroGLYCERIN (NITROSTAT) 0.4 MG SL tablet Place 0.4 mg under the tongue every 5 (five) minutes as needed for chest pain.   pantoprazole (PROTONIX) 40 MG tablet Take 40 mg by mouth daily.   venlafaxine XR (EFFEXOR XR) 150 MG 24 hr capsule Take 1 capsule (150 mg total) by mouth daily with breakfast. (Patient taking differently: Take 300 mg by mouth daily with breakfast. )   vitamin B-12 (CYANOCOBALAMIN) 500 MCG tablet Take 500 mcg by mouth daily.   [DISCONTINUED] isosorbide mononitrate (IMDUR) 30 MG 24 hr tablet TAKE 1 TABLET BY MOUTH DAILY    Cardiac Studies:   Abdominal Aortic Duplex  05/27/2019: Moderate plaque noted in the proximal, mid and distal aorta.  No abdominal aortic aneurysm. Distal abdominal aortic velocity is mildly elevated suggestive of <50% stenosis. Patient has h/o distal abdominal aortic stenosis and stenting on 10/29/2017. The stent appears patent.   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 Antegrade right vertebral artery flow. Antegrade left vertebral artery flow. Compared to the study done on 03/05/2018, left ICA stenosis has progressed from less than 50%.  No significant change in bilateral mild diffuse heterogenous plaque. Follow up in six months is appropriate if clinically indicated.  Lower extremity arterial duplex 12/05/2018: No  hemodynamically significant stenoses are identified in the lower extremity arterial system.  This exam reveals moderately decreased perfusion of the right lower extremity, noted at the dorsalis pedis and post tibial artery level (ABI 0.68) and mildly decreased perfusion of the left lower extremity, noted at the post tibial artery level (ABI 0.83). Triphasic waveform the the major vessels suggest diffuse small vessel disease. Left proximal SFA has < 50% stenosis.  208/06/2016, mild progression of PAD with decreased ABI bilaterally compared to left ABI 0.95 and right ABI 0.94.  Echocardiogram 02/26/2017: Left ventricle cavity is normal in size. Normal global wall motion. Normal diastolic filling pattern. Calculated EF 63%. Left atrial cavity is mild to moderately dilated by volume. Borderline prolapse of the posterior MV leaflet with mild anteriorly directed MR. Mild tricuspid regurgitation. No evidence of pulmonary hypertension. Compared to hospital Echocardiogram 07/02/2014: Moderate mitral regurgitation and moderate pulmonary hypertension, PA pressure estimated to be 59 mmHg no longer present.  Lexiscan myoview stress test 02/17/2018: 1. Lexiscan stress test was performed. Exercise capacity was not assessed. Stress symptoms included dizziness, nausea, headache, and chest pressure.. Peak blood pressure was 176/88 mmHg. Stress EKG is non diagnostic for ischemia as it is a pharmacologic stress. In addition, it showed The stress electrocardiogram showed sinus tachycardia, normal stress conduction, left ventricular hypertrophy, no stress arrhythmias and normal stress repolarization. 2. The overall quality of the study is excellent. Left ventricular cavity is noted to be normal on the rest and stress studies. Gated SPECT images reveal normal myocardial thickening and wall motion. The left ventricular ejection fraction was calculated or visually estimated to be 78%. SPECT images demonstrate small perfusion  abnormality of moderate intensity in the mid anterolateral and apical lateral myocardial wall(s) on the stress images, reversible on rest images. Findings suggest small area of moderate intensity ischemia in mid to apical anterolateral myocardium. 3. Intermediate risk study.  03/25/2018-cardiac cath.-normal coronary arteries.  abdominal aortogram 10/29/2017: Widely patent renal arteries. Distal abdominal aorta diffuse disease, calcification with 80% stenosis S/P 10 x 29 mm Omnilink Elite stent, 80% to 0%.  Renal arteriogram 02/29/2018: Widely patient stents. 04/10/2016: Scoring balloon angioplasty left in-stent restenosis with 6 x 20 mm angiosculpt balloon.  Right renal artery stent widely patent. H/O bilateral renal stenting on 09/07/2014: Right renal artery 6.0 x 15 mm Herculink widely patent, left renal artery  6.0 x 18 mm Herculink stent.  Renal artery duplex 09/18/2017: Hemodynamically significant stenosis of the right renal artery. Peak Velocity of 308/66 cm/s. Normal intrarenal vascular perfusion is noted in both kidneys. There is increased echogenecity of both kidneys suggests medico-renal disease with right kidney slightly shrunk at 8.03x3.97x4 cm compared to 9.144.664.6 cm Diffuse plaque noted in the abdominal aorta with calcification.  Compared to the study done on 01/22/2017, left renal artery stenosis not evident, right renal artery stenosis new.  Patient has h/o bilateral renal artery stenting.  Assessment:     ICD-10-CM   1. Angina pectoris ([place])  I20.9 EKG 12-Lead  2. Musculoskeletal chest pain  R07.89   3. Essential hypertension  I10   4. Peripheral arterial disease (HCC)  I73.9   5.  Tobacco use disorder  F17.200     EKG 07/02/2019: Sinus bradycardia at 52 bpm, normal axis, no evidence of ischemia.  Recommendations:   Patient has both nitroglycerin responsive chest pain suggestive of angina as well as musculoskeletal chest pain as she does have some tenderness on  physical exam that reproduces her chest pain.  I suspect her symptoms of angina are related to microvascular disease as she has had normal coronary angiogram in the past.  I will further increase her Imdur up to 60 mg.  Could also consider addition of Ranexa if she continues to have symptoms of angina.  There are some musculoskeletal chest pain, encouraged her to use heat and ice to her chest wall as well as Tylenol as needed.  I discussed recently obtain aortic duplex that shows patent abdominal aortic stenting.  She does have Distal velocity increased suggestive of greater than 50% stenosis.  She has recently underwent carotid duplex that showed slight progression of left ICA stenosis.  I recommended continued aggressive medical management.  Continue with Plavix.  She is due for annual physical with her PCP and will have lipids performed at that time.  Her symptoms of claudication have been stable.  I have encouraged her to discuss with her PCP on her upcoming appointment regarding her anxiety and depression as she may benefit from some counseling to help with her significant life changes recently and coping.  She has been unable to quit despite multiple conversations regarding the importance of quitting in the past.  She will continue to work on this.  I will see her back in 6 weeks to follow-up on her chest pain.  Miquel Dunn, MSN, APRN, FNP-C [facility]. [place] Office: [phone] Fax: [phone]

## 2019-07-06 ENCOUNTER — Other Ambulatory Visit: Payer: Self-pay | Admitting: Family Medicine

## 2019-07-06 DIAGNOSIS — Z1231 Encounter for screening mammogram for malignant neoplasm of breast: Secondary | ICD-10-CM

## 2019-07-07 ENCOUNTER — Ambulatory Visit: Payer: Medicare Other

## 2019-08-13 ENCOUNTER — Other Ambulatory Visit: Payer: Self-pay

## 2019-08-13 ENCOUNTER — Telehealth (INDEPENDENT_AMBULATORY_CARE_PROVIDER_SITE_OTHER): Payer: Medicare Other | Admitting: Cardiology

## 2019-08-13 ENCOUNTER — Encounter: Payer: Self-pay | Admitting: Cardiology

## 2019-08-13 VITALS — BP 118/63 | HR 51 | Ht 61.75 in | Wt 115.0 lb

## 2019-08-13 DIAGNOSIS — I209 Angina pectoris, unspecified: Secondary | ICD-10-CM

## 2019-08-13 DIAGNOSIS — F17200 Nicotine dependence, unspecified, uncomplicated: Secondary | ICD-10-CM

## 2019-08-13 DIAGNOSIS — I10 Essential (primary) hypertension: Secondary | ICD-10-CM

## 2019-08-13 NOTE — Progress Notes (Signed)
Primary Physician:  Bernerd Limbo, MD   [name], female    DOB: 20-May-1961, 58 y.o.   MRN: [id number]  Subjective:    Chief Complaint  Patient presents with   Chest Pain   Follow-up    leg weakness   This visit type was conducted due to national recommendations for restrictions regarding the COVID-19 Pandemic (e.g. social distancing).  This format is felt to be most appropriate for this patient at this time.  All issues noted in this document were discussed and addressed.  No physical exam was performed (except for noted visual exam findings with Telehealth visits).  The patient has consented to conduct a Telehealth visit and understands insurance will be billed.   I discussed the limitations of evaluation and management by telemedicine and the availability of in person appointments. The patient expressed understanding and agreed to proceed.  Virtual Visit via Video Note is as below  I connected with@, on 08/13/19 at 1320 by telephone and verified that I am speaking with the correct person using two identifiers. Unable to perform video visit as patient did not have equipment.    I have discussed with the patient regarding the safety during COVID Pandemic and steps and precautions including social distancing with the patient.    HPI: [name]  is a 58 y.o. female  with hypertension, COPD, diastolic CHF, ongoing tobacco abuse, and difficult to control hypertension with CKD, with bilateral intrarenal angioplasty in 2015 and again underwent renal angiography in 2017 that showed  restenosis of the left renal artery and underwent scoring balloon angioplasty with 6.020 mm angiosculpt balloon.She underwent renal duplex scan for surveillance that showed restenosis in the right renal artery with high-grade velocities. She underwent repeat renal and abdominal angiogram on 10/29/2017 that showed widely patent renal arteries, but distal abdominal aorta stenosis with  diffuse disease, calcification was present for which she underwent stenting with 10x 29 mm Omnilink Elite stent.  She underwent abdominal aortic duplex in August 2020 revealing patency abdominal aortic stent.  He was last seen 6 weeks ago for chest pain, Imdur was increased to 60 mg.  This is a telephone appointment for follow-up on her chest pain.  She has had significant improvement in her chest discomfort with medication changes and she is feeling much better.  No significant complaints today.  She underwent coronary angiogram in June 2019 for nitroglycerin responsive chest pain that revealed normal coronary arteries.   She reports recently being able to significantly cut back on tobacco use to now only smoking 1 or 2 cigarettes some days, but most days she is able to not smoke at all.  She is continue to work on complete smoking cessation.  She continues to be under significant life stress in dealing with her living situation and recent break-up with her boyfriend.  She also has chronic back pain. She has been told to have severe CKD; however, on last check, kidney function is normal.   Past Medical History:  Diagnosis Date   Anemia 2008   Anxiety    Aortic stenosis    Arthritis    "all my joints ache" (09/07/2014)   Asthma    Bipolar 1 disorder (HCC)    Bradycardia    Bruit    Carpal tunnel syndrome    Cervical cancer ([place]) 1985   CHF (congestive heart failure) (HCC)    Chronic kidney disease (CKD), stage V (HCC)    COPD (chronic obstructive pulmonary disease) (  [place]) 2000   Coronary artery disease    Daily headache    Depression 2000   Diverticulitis 2008   GERD (gastroesophageal reflux disease)    GERD (gastroesophageal reflux disease)    Glaucoma    Heart murmur    History of colon polyps 2009   HLD (hyperlipidemia) 2013   Hypertension 2013   Hypovitaminosis D    IBS (irritable bowel syndrome) 2008   Myocardial infarction [facility])    2015    PAD (peripheral artery disease) (HCC)    Pancreatitis 10/2011   Pneumonia 07/2014   RLS (restless legs syndrome)    Schizophrenia (HCC)    Shortness of breath    Skin cancer    Small bowel obstruction ([place]) 2008    Past Surgical History:  Procedure Laterality Date   ABDOMINAL ANGIOGRAM N/A 09/07/2014   Procedure: ABDOMINAL ANGIOGRAM;  Surgeon: Laverda Page, MD;  Location[facility] CATH LAB;  Service: Cardiovascular;  Laterality: N/A;   APPENDECTOMY  1996   COLON SURGERY  2008   6 inches of colon removed due to obstruction   COLONOSCOPY WITH PROPOFOL N/A 10/25/2016   Procedure: COLONOSCOPY WITH PROPOFOL;  Surgeon: Milus Banister, MD;  Location: [facility] ENDOSCOPY;  Service: Endoscopy;  Laterality: N/A;   CORONARY ANGIOPLASTY WITH STENT PLACEMENT  09/07/2014   "2"   LEFT HEART CATH AND CORONARY ANGIOGRAPHY N/A 03/25/2018   Procedure: LEFT HEART CATH AND CORONARY ANGIOGRAPHY;  Surgeon: Nigel Mormon, MD;  Location: [place] CV LAB;  Service: Cardiovascular;  Laterality: N/A;   LEFT HEART CATHETERIZATION WITH CORONARY ANGIOGRAM N/A 09/07/2014   Procedure: LEFT HEART CATHETERIZATION WITH CORONARY ANGIOGRAM;  Surgeon: Laverda Page, MD;  Location: [facility] CATH LAB;  Service: Cardiovascular;  Laterality: N/A;   LOWER EXTREMITY ANGIOGRAPHY  10/29/2017   Procedure: Lower Extremity Angiography;  Surgeon: Adrian Prows, MD;  Location: [place] CV LAB;  Service: Cardiovascular;;   PERIPHERAL VASCULAR CATHETERIZATION N/A 11/01/2015   Procedure: Renal Angiography;  Surgeon: Adrian Prows, MD;  Location: [place] CV LAB;  Service: Cardiovascular;  Laterality: N/A;   PERIPHERAL VASCULAR CATHETERIZATION N/A 04/10/2016   Procedure: Renal Angiography;  Surgeon: Adrian Prows, MD;  Location: [place] CV LAB;  Service: Cardiovascular;  Laterality: N/A;   PERIPHERAL VASCULAR CATHETERIZATION  04/10/2016   Procedure: Peripheral Vascular Intervention;  Surgeon: Adrian Prows, MD;  Location: [place]  CV LAB;  Service: Cardiovascular;;   PERIPHERAL VASCULAR INTERVENTION  10/29/2017   Procedure: PERIPHERAL VASCULAR INTERVENTION;  Surgeon: Adrian Prows, MD;  Location: [place] CV LAB;  Service: Cardiovascular;;   RENAL ANGIOGRAPHY N/A 10/29/2017   Procedure: RENAL ANGIOGRAPHY;  Surgeon: Adrian Prows, MD;  Location: [place] CV LAB;  Service: Cardiovascular;  Laterality: N/A;   RIGHT OOPHORECTOMY Right 1996   TOTAL ABDOMINAL HYSTERECTOMY  1997    Social History   Socioeconomic History   Marital status: Divorced    Spouse name: Not on file   Number of children: 1   Years of education: Not on file   Highest education level: Not on file  Occupational History   Occupation: Scientist, research (life sciences): UNEMPLOYED  Social Designer, fashion/clothing strain: Not on file   Food insecurity    Worry: Not on file    Inability: Not on file   Transportation needs    Medical: Not on file    Non-medical: Not on file  Tobacco Use   Smoking status: Current Every Day Smoker  Packs/day: 0.25    Years: 40.00    Pack years: 10.00    Types: Cigarettes   Smokeless tobacco: Never Used  Substance and Sexual Activity   Alcohol use: No   Drug use: Yes    Frequency: 3.0 times per week    Types: Marijuana    Comment: last use yesterday   Sexual activity: Yes    Birth control/protection: Post-menopausal  Lifestyle   Physical activity    Days per week: Not on file    Minutes per session: Not on file   Stress: Not on file  Relationships   Social connections    Talks on phone: Not on file    Gets together: Not on file    Attends religious service: Not on file    Active member of club or organization: Not on file    Attends meetings of clubs or organizations: Not on file    Relationship status: Not on file   Intimate partner violence    Fear of current or ex partner: Not on file    Emotionally abused: Not on file    Physically abused: Not on file    Forced sexual  activity: Not on file  Other Topics Concern   Not on file  Social History Narrative   Not on file    Review of Systems  Constitution: Negative for decreased appetite, malaise/fatigue, weight gain and weight loss.  Eyes: Negative for visual disturbance.  Cardiovascular: Positive for claudication and dyspnea on exertion. Negative for chest pain, leg swelling, orthopnea, palpitations and syncope.  Respiratory: Negative for hemoptysis and wheezing.   Endocrine: Negative for cold intolerance and heat intolerance.  Hematologic/Lymphatic: Does not bruise/bleed easily.  Skin: Negative for nail changes.  Musculoskeletal: Negative for muscle weakness and myalgias.  Gastrointestinal: Negative for abdominal pain, change in bowel habit, nausea and vomiting.  Neurological: Negative for difficulty with concentration, dizziness, focal weakness and headaches.  Psychiatric/Behavioral: Positive for depression. Negative for altered mental status and suicidal ideas.  All other systems reviewed and are negative.     Objective:  Blood pressure 118/63, pulse (!) 51, height 5' 1.75" (1.568 m), weight 115 lb (52.2 kg). Body mass index is 21.2 kg/m.    Physical Exam  Constitutional: She is oriented to person, place, and time. Vital signs are normal. She appears well-developed and well-nourished.  HENT:  Head: Normocephalic and atraumatic.  Neck: Normal range of motion.  Cardiovascular: Normal rate, regular rhythm, normal heart sounds and intact distal pulses.  Pulses:      Carotid pulses are on the right side with bruit and on the left side with bruit.      Femoral pulses are 2+ on the right side with bruit and 2+ on the left side with bruit.      Popliteal pulses are 1+ on the right side and 1+ on the left side.       Dorsalis pedis pulses are 1+ on the right side and 1+ on the left side.       Posterior tibial pulses are 1+ on the right side and 1+ on the left side.  Pulmonary/Chest: Effort normal and  breath sounds normal. No accessory muscle usage. No respiratory distress. She exhibits tenderness.  Prolonged expiration, barrel shaped chest  Abdominal: Soft. Bowel sounds are normal. She exhibits abdominal bruit.  Musculoskeletal: Normal range of motion.  Neurological: She is alert and oriented to person, place, and time.  Skin: Skin is warm and dry.  Vitals reviewed.  Radiology: No results found.  Laboratory examination:    CMP Latest Ref Rng & Units 06/05/2017 11/09/2015 11/02/2015  Glucose 65 - 99 mg/dL 68 111(H) 153(H)  BUN 6 - 20 mg/dL 16 12 23(H)  Creatinine 0.44 - 1.00 mg/dL 1.51(H) 1.11(H) 1.58(H)  Sodium 135 - 145 mmol/L 134(L) 137 134(L)  Potassium 3.5 - 5.1 mmol/L 4.5 4.8 3.6  Chloride 101 - 111 mmol/L 99(L) 107 93(L)  CO2 22 - 32 mmol/L 27 19(L) 28  Calcium 8.9 - 10.3 mg/dL 9.7 9.9 8.8(L)  Total Protein 6.5 - 8.1 g/dL - - -  Total Bilirubin 0.3 - 1.2 mg/dL - - -  Alkaline Phos 38 - 126 U/L - - -  AST 15 - 41 U/L - - -  ALT 14 - 54 U/L - - -   CBC Latest Ref Rng & Units 06/05/2017 11/09/2015 11/02/2015  WBC 4.0 - 10.5 K/uL 7.0 7.6 10.5  Hemoglobin 12.0 - 15.0 g/dL 11.6(L) 11.3(L) 9.3(L)  Hematocrit 36.0 - 46.0 % 35.9(L) 33.9(L) 29.2(L)  Platelets 150 - 400 K/uL 402(H) 523(H) 314   Lipid Panel     Component Value Date/Time   CHOL 190 10/08/2012 0228   TRIG 152 (H) 10/08/2012 0228   HDL 51 10/08/2012 0228   CHOLHDL 3.7 10/08/2012 0228   VLDL 30 10/08/2012 0228   LDLCALC 109 (H) 10/08/2012 0228   HEMOGLOBIN A1C No results found for: HGBA1C, MPG TSH No results for input(s): TSH in the last 8760 hours.  PRN Meds:. There are no discontinued medications. Current Meds  Medication Sig   acetaminophen (TYLENOL) 500 MG tablet Take 1,000 mg by mouth every 8 (eight) hours as needed for mild pain or headache.   albuterol (PROVENTIL HFA;VENTOLIN HFA) 108 (90 BASE) MCG/ACT inhaler Inhale 2 puffs into the lungs 2 (two) times daily as needed (asthma).    ALPRAZolam (XANAX) 1  MG tablet Take 1 tablet by mouth 2 (two) times daily as needed for anxiety.    aspirin EC 81 MG tablet Take 81 mg by mouth daily.   atenolol (TENORMIN) 25 MG tablet Take 1 tablet (25 mg total) by mouth 2 (two) times daily.   atorvastatin (LIPITOR) 80 MG tablet Take 80 mg by mouth daily.    azelastine (ASTELIN) 0.1 % nasal spray Place 1 spray into both nostrils daily at 2 PM.   cetirizine (ZYRTEC) 10 MG tablet Take 1 tablet by mouth daily at 2 PM.   clopidogrel (PLAVIX) 75 MG tablet TAKE 1 TABLET BY MOUTH ONCE DAILY   conjugated estrogens (PREMARIN) vaginal cream Place vaginally.   gabapentin (NEURONTIN) 400 MG capsule Take 1 capsule (400 mg total) by mouth 2 (two) times daily. Patient takes 400 mg 3 times daily and 1200 mg at bedtime (Patient taking differently: Take 400 mg by mouth at bedtime. )   isosorbide mononitrate (IMDUR) 60 MG 24 hr tablet Take 1 tablet (60 mg total) by mouth daily.   Multiple Vitamin (MULTIVITAMIN WITH MINERALS) TABS tablet Take 1 tablet by mouth daily.   nitroGLYCERIN (NITROSTAT) 0.4 MG SL tablet Place 0.4 mg under the tongue every 5 (five) minutes as needed for chest pain.   pantoprazole (PROTONIX) 40 MG tablet Take 40 mg by mouth daily.   venlafaxine XR (EFFEXOR XR) 150 MG 24 hr capsule Take 1 capsule (150 mg total) by mouth daily with breakfast. (Patient taking differently: Take 300 mg by mouth daily with breakfast. )   vitamin B-12 (CYANOCOBALAMIN) 500 MCG tablet Take 500 mcg by  mouth daily.    Cardiac Studies:   Abdominal Aortic Duplex  05/27/2019: Moderate plaque noted in the proximal, mid and distal aorta.  No abdominal aortic aneurysm. Distal abdominal aortic velocity is mildly elevated suggestive of <50% stenosis. Patient has h/o distal abdominal aortic stenosis and stenting on 10/29/2017. The stent appears patent.   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 Antegrade right vertebral artery flow.  Antegrade left vertebral artery flow. Compared to the study done on 03/05/2018, left ICA stenosis has progressed from less than 50%.  No significant change in bilateral mild diffuse heterogenous plaque. Follow up in six months is appropriate if clinically indicated.  Lower extremity arterial duplex 12/05/2018: No hemodynamically significant stenoses are identified in the lower extremity arterial system.  This exam reveals moderately decreased perfusion of the right lower extremity, noted at the dorsalis pedis and post tibial artery level (ABI 0.68) and mildly decreased perfusion of the left lower extremity, noted at the post tibial artery level (ABI 0.83). Triphasic waveform the the major vessels suggest diffuse small vessel disease. Left proximal SFA has < 50% stenosis.  208/06/2016, mild progression of PAD with decreased ABI bilaterally compared to left ABI 0.95 and right ABI 0.94.  Echocardiogram 02/26/2017: Left ventricle cavity is normal in size. Normal global wall motion. Normal diastolic filling pattern. Calculated EF 63%. Left atrial cavity is mild to moderately dilated by volume. Borderline prolapse of the posterior MV leaflet with mild anteriorly directed MR. Mild tricuspid regurgitation. No evidence of pulmonary hypertension. Compared to hospital Echocardiogram 07/02/2014: Moderate mitral regurgitation and moderate pulmonary hypertension, PA pressure estimated to be 59 mmHg no longer present.  Lexiscan myoview stress test 02/17/2018: 1. Lexiscan stress test was performed. Exercise capacity was not assessed. Stress symptoms included dizziness, nausea, headache, and chest pressure.. Peak blood pressure was 176/88 mmHg. Stress EKG is non diagnostic for ischemia as it is a pharmacologic stress. In addition, it showed The stress electrocardiogram showed sinus tachycardia, normal stress conduction, left ventricular hypertrophy, no stress arrhythmias and normal stress repolarization. 2. The  overall quality of the study is excellent. Left ventricular cavity is noted to be normal on the rest and stress studies. Gated SPECT images reveal normal myocardial thickening and wall motion. The left ventricular ejection fraction was calculated or visually estimated to be 78%. SPECT images demonstrate small perfusion abnormality of moderate intensity in the mid anterolateral and apical lateral myocardial wall(s) on the stress images, reversible on rest images. Findings suggest small area of moderate intensity ischemia in mid to apical anterolateral myocardium. 3. Intermediate risk study.  03/25/2018-cardiac cath.-normal coronary arteries.  abdominal aortogram 10/29/2017: Widely patent renal arteries. Distal abdominal aorta diffuse disease, calcification with 80% stenosis S/P 10 x 29 mm Omnilink Elite stent, 80% to 0%.  Renal arteriogram 02/29/2018: Widely patient stents. 04/10/2016: Scoring balloon angioplasty left in-stent restenosis with 6 x 20 mm angiosculpt balloon.  Right renal artery stent widely patent. H/O bilateral renal stenting on 09/07/2014: Right renal artery 6.0 x 15 mm Herculink widely patent, left renal artery  6.0 x 18 mm Herculink stent.  Renal artery duplex 09/18/2017: Hemodynamically significant stenosis of the right renal artery. Peak Velocity of 308/66 cm/s. Normal intrarenal vascular perfusion is noted in both kidneys. There is increased echogenecity of both kidneys suggests medico-renal disease with right kidney slightly shrunk at 8.03x3.97x4 cm compared to 9.144.664.6 cm Diffuse plaque noted in the abdominal aorta with calcification.  Compared to the study done on 01/22/2017, left  renal artery stenosis not evident, right renal artery stenosis new.  Patient has h/o bilateral renal artery stenting.  Assessment:     ICD-10-CM   1. Angina pectoris (HCC)  I20.9   2. Essential hypertension  I10   3. Tobacco use disorder  F17.200     EKG 07/02/2019: Sinus bradycardia at  52 bpm, normal axis, no evidence of ischemia.  Recommendations:   Since being on higher dose of Imdur, she has had improvement in her chest pain it is overall feeling well.  Her blood pressure remained stable.  She has not had any significant dizziness.  She is on appropriate medical therapy.  She is to see her PCP next week for physical and will request labs to be sent to [place] for our evaluation.  Since last seen by me, she has significantly cut back on her tobacco use and goes most days without smoking any cigarettes.  She has had a significant difficulty with quitting smoking, I have congratulated her on this and provided positive reinforcement.  Encouraged her to continue to work on complete smoking cessation to further reduce her risk factors.  We will plan to see her back in approximately 6 months for follow-up on PAD encouraged her to contact me sooner as needed.  Miquel Dunn, MSN, APRN, FNP-C [facility]. [place] Office: [phone] Fax: [phone]

## 2019-09-03 ENCOUNTER — Other Ambulatory Visit: Payer: Self-pay

## 2019-09-03 ENCOUNTER — Ambulatory Visit
Admission: RE | Admit: 2019-09-03 | Discharge: 2019-09-03 | Disposition: A | Discharge status: Home / Self Care | Payer: Medicare Other | Source: Ambulatory Visit | Attending: Family Medicine | Admitting: Family Medicine

## 2019-09-03 DIAGNOSIS — Z1231 Encounter for screening mammogram for malignant neoplasm of breast: Secondary | ICD-10-CM

## 2019-10-09 ENCOUNTER — Other Ambulatory Visit: Payer: Self-pay

## 2019-10-09 ENCOUNTER — Ambulatory Visit (INDEPENDENT_AMBULATORY_CARE_PROVIDER_SITE_OTHER): Payer: Medicare Other

## 2019-10-09 DIAGNOSIS — I6523 Occlusion and stenosis of bilateral carotid arteries: Secondary | ICD-10-CM

## 2019-10-09 DIAGNOSIS — R0989 Other specified symptoms and signs involving the circulatory and respiratory systems: Secondary | ICD-10-CM | POA: Diagnosis not present

## 2019-10-09 DIAGNOSIS — I6522 Occlusion and stenosis of left carotid artery: Secondary | ICD-10-CM

## 2019-10-11 ENCOUNTER — Other Ambulatory Visit: Payer: Self-pay | Admitting: Cardiology

## 2019-10-11 DIAGNOSIS — I6523 Occlusion and stenosis of bilateral carotid arteries: Secondary | ICD-10-CM

## 2019-10-11 NOTE — Progress Notes (Signed)
There is progression of carotid disease. Needs OV to look at  her cardiac risks again. Please set up routine OV with me

## 2019-10-12 ENCOUNTER — Telehealth: Payer: Self-pay

## 2019-10-14 ENCOUNTER — Telehealth: Payer: Self-pay | Admitting: Cardiology

## 2019-10-14 ENCOUNTER — Other Ambulatory Visit: Payer: Self-pay | Admitting: Cardiology

## 2019-10-14 DIAGNOSIS — I6523 Occlusion and stenosis of bilateral carotid arteries: Secondary | ICD-10-CM

## 2019-10-14 NOTE — Telephone Encounter (Signed)
Patient has not had recent lipid profile testing. Will try to have lipids performed today

## 2019-10-15 ENCOUNTER — Other Ambulatory Visit: Payer: Self-pay

## 2019-10-15 ENCOUNTER — Encounter: Payer: Self-pay | Admitting: Cardiology

## 2019-10-15 ENCOUNTER — Ambulatory Visit (INDEPENDENT_AMBULATORY_CARE_PROVIDER_SITE_OTHER): Payer: Medicare Other | Admitting: Cardiology

## 2019-10-15 VITALS — BP 138/71 | HR 58 | Temp 97.6°F | Ht 61.75 in | Wt 112.8 lb

## 2019-10-15 DIAGNOSIS — I6523 Occlusion and stenosis of bilateral carotid arteries: Secondary | ICD-10-CM | POA: Diagnosis not present

## 2019-10-15 DIAGNOSIS — K219 Gastro-esophageal reflux disease without esophagitis: Secondary | ICD-10-CM

## 2019-10-15 DIAGNOSIS — F17200 Nicotine dependence, unspecified, uncomplicated: Secondary | ICD-10-CM | POA: Diagnosis not present

## 2019-10-15 DIAGNOSIS — R0789 Other chest pain: Secondary | ICD-10-CM

## 2019-10-15 DIAGNOSIS — E7800 Pure hypercholesterolemia, unspecified: Secondary | ICD-10-CM | POA: Diagnosis not present

## 2019-10-15 DIAGNOSIS — I358 Other nonrheumatic aortic valve disorders: Secondary | ICD-10-CM

## 2019-10-15 LAB — LIPID PANEL
Chol/HDL Ratio: 2.5 ratio (ref 0.0–4.4)
Cholesterol, Total: 134 mg/dL (ref 100–199)
HDL: 54 mg/dL (ref >39)
LDL Chol Calc (NIH): 64 mg/dL (ref 0–99)
Triglycerides: 81 mg/dL (ref 0–149)
VLDL Cholesterol Cal: 16 mg/dL (ref 5–40)

## 2019-10-15 MED ORDER — PANTOPRAZOLE SODIUM 40 MG PO TBEC: 40.0000 mg | DELAYED_RELEASE_TABLET | Freq: Two times a day (BID) | ORAL | 3 refills | Status: DC

## 2019-10-15 MED ORDER — NITROGLYCERIN 0.4 MG SL SUBL: 0.4000 mg | SUBLINGUAL_TABLET | SUBLINGUAL | 1 refills | Status: AC | PRN

## 2019-10-15 NOTE — Progress Notes (Signed)
Primary Physician:  Bernerd Limbo, MD   [name], female    DOB: 02/13/1961, 59 y.o.   MRN: [id number]  Subjective:    Chief Complaint  [name]  is a 59 y.o. female  with hypertension, COPD, diastolic CHF, ongoing tobacco abuse, and difficult to control hypertension with CKD, with bilateral intrarenal angioplasty in 2015 and again underwent renal angiography in 2017 that showed  restenosis of the left renal artery and underwent scoring balloon angioplasty with 6.020 mm angiosculpt balloon.She underwent renal duplex scan for surveillance that showed restenosis in the right renal artery with high-grade velocities. She underwent repeat renal and abdominal angiogram on 10/29/2017 that showed widely patent renal arteries, but distal abdominal aorta stenosis with diffuse disease, calcification was present for which she underwent stenting with 10x 29 mm Omnilink Elite stent. She underwent coronary angiogram in June 2019 for nitroglycerin responsive chest pain that revealed normal coronary arteries.   She underwent abdominal aortic duplex in August 2020 revealing patency abdominal aortic stent.  I have brought patient in to see him today to discuss her recent carotid duplex results.  She is overall doing well, her personal situation has improved and she is under less stress.  She is compliant with her medications.  She does complain of central chest pain that is persistent for the last 2 weeks.  Has history of GERD and feels that this may be contributing.  She is also tender to touch of her chest.  Unfortunately she continues to smoke.  She also has chronic back pain. She has been told to have severe CKD; however, on last check, kidney function is normal.   Past Medical History:  Diagnosis Date  . Anemia 2008  . Anxiety   . Aortic stenosis   . Arthritis    "all my joints ache" (09/07/2014)  . Asthma   . Bipolar 1 disorder ([place])   . Bradycardia   . Bruit   . Carpal tunnel syndrome   . Cervical cancer ([place]) 1985  . CHF (congestive heart failure) ([place])   . Chronic kidney disease (CKD), stage V ([place])   . COPD (chronic obstructive pulmonary disease) ([place]) 2000  . Coronary artery disease   . Daily headache   . Depression 2000  . Diverticulitis 2008  . GERD (gastroesophageal reflux disease)   . GERD (gastroesophageal reflux disease)   . Glaucoma   . Heart murmur   . History of colon polyps 2009  . HLD (hyperlipidemia) 2013  . Hypertension 2013  . Hypovitaminosis D   . IBS (irritable bowel syndrome) 2008  . Myocardial infarction ([place])    2015  . PAD (peripheral artery disease) ([place])   . Pancreatitis 10/2011  . Pneumonia 07/2014  . RLS (restless legs syndrome)   . Schizophrenia ([place])   . Shortness of breath   . Skin cancer   . Small bowel obstruction ([place]) 2008    Past Surgical History:  Procedure Laterality Date  . ABDOMINAL ANGIOGRAM N/A 09/07/2014   Procedure: ABDOMINAL ANGIOGRAM;  Surgeon: Laverda Page, MD;  Location: [facility] CATH LAB;  Service: Cardiovascular;  Laterality: N/A;  . APPENDECTOMY  1996  . COLON SURGERY  2008   6 inches of colon removed due to obstruction  . COLONOSCOPY WITH PROPOFOL N/A 10/25/2016   Procedure: COLONOSCOPY WITH PROPOFOL;  Surgeon: Milus Banister,  MD;  Location: [facility] ENDOSCOPY;  Service: Endoscopy;  Laterality: N/A;  . CORONARY ANGIOPLASTY WITH STENT PLACEMENT  09/07/2014   "2"  . LEFT HEART CATH AND CORONARY ANGIOGRAPHY N/A 03/25/2018   Procedure: LEFT HEART CATH AND CORONARY ANGIOGRAPHY;  Surgeon: Nigel Mormon, MD;  Location: [place] CV LAB;  Service: Cardiovascular;  Laterality: N/A;  . LEFT HEART CATHETERIZATION WITH CORONARY ANGIOGRAM N/A 09/07/2014   Procedure: LEFT HEART CATHETERIZATION WITH CORONARY ANGIOGRAM;  Surgeon: Laverda Page, MD;  Location: [facility] CATH LAB;  Service:  Cardiovascular;  Laterality: N/A;  . LOWER EXTREMITY ANGIOGRAPHY  10/29/2017   Procedure: Lower Extremity Angiography;  Surgeon: Adrian Prows, MD;  Location: [place] CV LAB;  Service: Cardiovascular;;  . PERIPHERAL VASCULAR CATHETERIZATION N/A 11/01/2015   Procedure: Renal Angiography;  Surgeon: Adrian Prows, MD;  Location: [place] CV LAB;  Service: Cardiovascular;  Laterality: N/A;  . PERIPHERAL VASCULAR CATHETERIZATION N/A 04/10/2016   Procedure: Renal Angiography;  Surgeon: Adrian Prows, MD;  Location: [place] CV LAB;  Service: Cardiovascular;  Laterality: N/A;  . PERIPHERAL VASCULAR CATHETERIZATION  04/10/2016   Procedure: Peripheral Vascular Intervention;  Surgeon: Adrian Prows, MD;  Location: [place] CV LAB;  Service: Cardiovascular;;  . PERIPHERAL VASCULAR INTERVENTION  10/29/2017   Procedure: PERIPHERAL VASCULAR INTERVENTION;  Surgeon: Adrian Prows, MD;  Location: [place] CV LAB;  Service: Cardiovascular;;  . RENAL ANGIOGRAPHY N/A 10/29/2017   Procedure: RENAL ANGIOGRAPHY;  Surgeon: Adrian Prows, MD;  Location: [place] CV LAB;  Service: Cardiovascular;  Laterality: N/A;  . RIGHT OOPHORECTOMY Right 1996  . TOTAL ABDOMINAL HYSTERECTOMY  1997    Social History   Socioeconomic History  . Marital status: Divorced    Spouse name: Not on file  . Number of children: 1  . Years of education: Not on file  . Highest education level: Not on file  Occupational History  . Occupation: Scientist, research (life sciences): UNEMPLOYED  Tobacco Use  . Smoking status: Current Every Day Smoker    Packs/day: 0.25    Years: 40.00    Pack years: 10.00    Types: Cigarettes  . Smokeless tobacco: Never Used  Substance and Sexual Activity  . Alcohol use: No  . Drug use: Yes    Frequency: 3.0 times per week    Types: Marijuana    Comment: last use yesterday  . Sexual activity: Yes    Birth control/protection: Post-menopausal  Other Topics Concern  . Not on file  Social History Narrative  . Not  on file   Social Determinants of Health   Financial Resource Strain:   . Difficulty of Paying Living Expenses: Not on file  Food Insecurity:   . Worried About Charity fundraiser in the Last Year: Not on file  . Ran Out of Food in the Last Year: Not on file  Transportation Needs:   . Lack of Transportation (Medical): Not on file  . Lack of Transportation (Non-Medical): Not on file  Physical Activity:   . Days of Exercise per Week: Not on file  . Minutes of Exercise per Session: Not on file  Stress:   . Feeling of Stress : Not on file  Social Connections:   . Frequency of Communication with Friends and Family: Not on file  . Frequency of Social Gatherings with Friends and Family: Not on file  . Attends Religious Services: Not on file  . Active Member of Clubs or Organizations: Not on file  . Attends  Club or Organization Meetings: Not on file  . Marital Status: Not on file  Intimate Partner Violence:   . Fear of Current or Ex-Partner: Not on file  . Emotionally Abused: Not on file  . Physically Abused: Not on file  . Sexually Abused: Not on file    Review of Systems  Constitution: Negative for decreased appetite, malaise/fatigue, weight gain and weight loss.  Eyes: Negative for visual disturbance.  Cardiovascular: Positive for chest pain, claudication and dyspnea on exertion. Negative for leg swelling, orthopnea, palpitations and syncope.  Respiratory: Negative for hemoptysis and wheezing.   Endocrine: Negative for cold intolerance and heat intolerance.  Hematologic/Lymphatic: Does not bruise/bleed easily.  Skin: Negative for nail changes.  Musculoskeletal: Negative for muscle weakness and myalgias.  Gastrointestinal: Negative for abdominal pain, change in bowel habit, nausea and vomiting.  Neurological: Negative for difficulty with concentration, dizziness, focal weakness and headaches.  Psychiatric/Behavioral: Positive for depression. Negative for altered mental status and  suicidal ideas.  All other systems reviewed and are negative.     Objective:  Blood pressure 138/71, pulse (!) 58, temperature 97.6 F (36.4 C), height 5' 1.75" (1.568 m), weight 112 lb 12.8 oz (51.2 kg), SpO2 97 %. Body mass index is 20.8 kg/m.    Physical Exam  Constitutional: She is oriented to person, place, and time. Vital signs are normal. She appears well-developed and well-nourished.  HENT:  Head: Normocephalic and atraumatic.  Cardiovascular: Normal rate, regular rhythm and intact distal pulses.  Murmur heard.  Early systolic murmur is present with a grade of 2/6 at the upper right sternal border and upper left sternal border. Pulses:      Carotid pulses are on the right side with bruit and on the left side with bruit.      Femoral pulses are 2+ on the right side with bruit and 2+ on the left side with bruit.      Popliteal pulses are 1+ on the right side and 1+ on the left side.       Dorsalis pedis pulses are 1+ on the right side and 1+ on the left side.       Posterior tibial pulses are 1+ on the right side and 1+ on the left side.  Pulmonary/Chest: Effort normal and breath sounds normal. No accessory muscle usage. No respiratory distress. She exhibits tenderness.    Prolonged expiration, barrel shaped chest  Abdominal: Soft. Bowel sounds are normal. She exhibits abdominal bruit.  Musculoskeletal:        General: Normal range of motion.     Cervical back: Normal range of motion.  Neurological: She is alert and oriented to person, place, and time.  Skin: Skin is warm and dry.  Vitals reviewed.  Radiology: No results found.  Laboratory examination:    CMP Latest Ref Rng & Units 06/05/2017 11/09/2015 11/02/2015  Glucose 65 - 99 mg/dL 68 111(H) 153(H)  BUN 6 - 20 mg/dL 16 12 23(H)  Creatinine 0.44 - 1.00 mg/dL 1.51(H) 1.11(H) 1.58(H)  Sodium 135 - 145 mmol/L 134(L) 137 134(L)  Potassium 3.5 - 5.1 mmol/L 4.5 4.8 3.6  Chloride 101 - 111 mmol/L 99(L) 107 93(L)  CO2 22 -  32 mmol/L 27 19(L) 28  Calcium 8.9 - 10.3 mg/dL 9.7 9.9 8.8(L)  Total Protein 6.5 - 8.1 g/dL - - -  Total Bilirubin 0.3 - 1.2 mg/dL - - -  Alkaline Phos 38 - 126 U/L - - -  AST 15 - 41 U/L - - -  ALT 14 - 54 U/L - - -   CBC Latest Ref Rng & Units 06/05/2017 11/09/2015 11/02/2015  WBC 4.0 - 10.5 K/uL 7.0 7.6 10.5  Hemoglobin 12.0 - 15.0 g/dL 11.6(L) 11.3(L) 9.3(L)  Hematocrit 36.0 - 46.0 % 35.9(L) 33.9(L) 29.2(L)  Platelets 150 - 400 K/uL 402(H) 523(H) 314   Lipid Panel     Component Value Date/Time   CHOL 134 10/14/2019 1408   TRIG 81 10/14/2019 1408   HDL 54 10/14/2019 1408   CHOLHDL 2.5 10/14/2019 1408   CHOLHDL 3.7 10/08/2012 0228   VLDL 30 10/08/2012 0228   LDLCALC 64 10/14/2019 1408   HEMOGLOBIN A1C No results found for: HGBA1C, MPG TSH No results for input(s): TSH in the last 8760 hours.  PRN Meds:. Medications Discontinued During This Encounter  Medication Reason  . pantoprazole (PROTONIX) 40 MG tablet Discontinued by provider  . nitroGLYCERIN (NITROSTAT) 0.4 MG SL tablet Reorder   Current Meds  Medication Sig  . acetaminophen (TYLENOL) 500 MG tablet Take 1,000 mg by mouth every 8 (eight) hours as needed for mild pain or headache.  . albuterol (PROVENTIL HFA;VENTOLIN HFA) 108 (90 BASE) MCG/ACT inhaler Inhale 2 puffs into the lungs 2 (two) times daily as needed (asthma).   . ALPRAZolam (XANAX) 1 MG tablet Take 1 tablet by mouth 2 (two) times daily as needed for anxiety.   Marland Kitchen aspirin EC 81 MG tablet Take 81 mg by mouth daily.  Marland Kitchen atenolol (TENORMIN) 25 MG tablet Take 1 tablet (25 mg total) by mouth 2 (two) times daily.  Marland Kitchen atorvastatin (LIPITOR) 80 MG tablet Take 80 mg by mouth daily.   Marland Kitchen azelastine (ASTELIN) 0.1 % nasal spray Place 1 spray into both nostrils daily at 2 PM.  . Calcium Carb-Cholecalciferol (CALCIUM-VITAMIN D) 600-400 MG-UNIT TABS Take 1 tablet by mouth daily.  . cetirizine (ZYRTEC) 10 MG tablet Take 1 tablet by mouth daily at 2 PM.  . clopidogrel (PLAVIX) 75  MG tablet TAKE 1 TABLET BY MOUTH ONCE A DAY  . conjugated estrogens (PREMARIN) vaginal cream Place vaginally.  . gabapentin (NEURONTIN) 400 MG capsule Take 1 capsule (400 mg total) by mouth 2 (two) times daily. Patient takes 400 mg 3 times daily and 1200 mg at bedtime (Patient taking differently: Take 400 mg by mouth at bedtime. )  . isosorbide mononitrate (IMDUR) 60 MG 24 hr tablet Take 1 tablet (60 mg total) by mouth daily.  . mometasone-formoterol (DULERA) 100-5 MCG/ACT AERO Take 2 puffs first thing in am and then another 2 puffs about 12 hours later.  . Multiple Vitamin (MULTIVITAMIN WITH MINERALS) TABS tablet Take 1 tablet by mouth daily.  . nitroGLYCERIN (NITROSTAT) 0.4 MG SL tablet Place 1 tablet (0.4 mg total) under the tongue every 5 (five) minutes as needed for chest pain.  Marland Kitchen venlafaxine XR (EFFEXOR XR) 150 MG 24 hr capsule Take 1 capsule (150 mg total) by mouth daily with breakfast. (Patient taking differently: Take 300 mg by mouth daily with breakfast. )  . vitamin B-12 (CYANOCOBALAMIN) 500 MCG tablet Take 500 mcg by mouth daily.  . [DISCONTINUED] nitroGLYCERIN (NITROSTAT) 0.4 MG SL tablet Place 0.4 mg under the tongue every 5 (five) minutes as needed for chest pain.  . [DISCONTINUED] pantoprazole (PROTONIX) 40 MG tablet Take 40 mg by mouth daily.    Cardiac Studies:   Carotid artery duplex  10/09/2019: Doppler flow velocities in the right internal carotid artery are consistent with stenosis in the range of 50-69% with >= 50% heterogeneous  plaque. Doppler flow velocities in the left internal carotid artery are consistent with stenosis in the range of 50-69% with >= 50% heterogeneous plaque. Antegrade right vertebral artery flow. Antegrade left vertebral artery flow. Compared to the study done on 04/17/2019, this progression of disease, right ICA stenosis is new. Follow up in six months is appropriate if clinically indicated.  Abdominal Aortic Duplex  05/27/2019: Moderate plaque  noted in the proximal, mid and distal aorta.  No abdominal aortic aneurysm. Distal abdominal aortic velocity is mildly elevated suggestive of <50% stenosis. Patient has h/o distal abdominal aortic stenosis and stenting on 10/29/2017. The stent appears patent.   Lower extremity arterial duplex 12/05/2018: No hemodynamically significant stenoses are identified in the lower extremity arterial system.  This exam reveals moderately decreased perfusion of the right lower extremity, noted at the dorsalis pedis and post tibial artery level (ABI 0.68) and mildly decreased perfusion of the left lower extremity, noted at the post tibial artery level (ABI 0.83). Triphasic waveform the the major vessels suggest diffuse small vessel disease. Left proximal SFA has < 50% stenosis.  208/06/2016, mild progression of PAD with decreased ABI bilaterally compared to left ABI 0.95 and right ABI 0.94.  Echocardiogram 02/26/2017: Left ventricle cavity is normal in size. Normal global wall motion. Normal diastolic filling pattern. Calculated EF 63%. Left atrial cavity is mild to moderately dilated by volume. Borderline prolapse of the posterior MV leaflet with mild anteriorly directed MR. Mild tricuspid regurgitation. No evidence of pulmonary hypertension. Compared to hospital Echocardiogram 07/02/2014: Moderate mitral regurgitation and moderate pulmonary hypertension, PA pressure estimated to be 59 mmHg no longer present.  Lexiscan myoview stress test 02/17/2018: 1. Lexiscan stress test was performed. Exercise capacity was not assessed. Stress symptoms included dizziness, nausea, headache, and chest pressure.. Peak blood pressure was 176/88 mmHg. Stress EKG is non diagnostic for ischemia as it is a pharmacologic stress. In addition, it showed The stress electrocardiogram showed sinus tachycardia, normal stress conduction, left ventricular hypertrophy, no stress arrhythmias and normal stress repolarization. 2. The overall  quality of the study is excellent. Left ventricular cavity is noted to be normal on the rest and stress studies. Gated SPECT images reveal normal myocardial thickening and wall motion. The left ventricular ejection fraction was calculated or visually estimated to be 78%. SPECT images demonstrate small perfusion abnormality of moderate intensity in the mid anterolateral and apical lateral myocardial wall(s) on the stress images, reversible on rest images. Findings suggest small area of moderate intensity ischemia in mid to apical anterolateral myocardium. 3. Intermediate risk study.  03/25/2018-cardiac cath.-normal coronary arteries.  abdominal aortogram 10/29/2017: Widely patent renal arteries. Distal abdominal aorta diffuse disease, calcification with 80% stenosis S/P 10 x 29 mm Omnilink Elite stent, 80% to 0%.  Renal arteriogram 02/29/2018: Widely patient stents. 04/10/2016: Scoring balloon angioplasty left in-stent restenosis with 6 x 20 mm angiosculpt balloon.  Right renal artery stent widely patent. H/O bilateral renal stenting on 09/07/2014: Right renal artery 6.0 x 15 mm Herculink widely patent, left renal artery  6.0 x 18 mm Herculink stent.  Renal artery duplex 09/18/2017: Hemodynamically significant stenosis of the right renal artery. Peak Velocity of 308/66 cm/s. Normal intrarenal vascular perfusion is noted in both kidneys. There is increased echogenecity of both kidneys suggests medico-renal disease with right kidney slightly shrunk at 8.03x3.97x4 cm compared to 9.144.664.6 cm Diffuse plaque noted in the abdominal aorta with calcification.  Compared to the study done on 01/22/2017, left renal artery stenosis not evident, right renal artery  stenosis new.  Patient has h/o bilateral renal artery stenting.  Assessment:     ICD-10-CM   1. Asymptomatic bilateral carotid artery stenosis  I65.23   2. Aortic systolic murmur on examination  I35.8 PCV ECHOCARDIOGRAM COMPLETE  3. Pure  hypercholesterolemia  E78.00   4. Tobacco use disorder  F17.200   5. GERD without esophagitis  K21.9   6. Musculoskeletal chest pain  R07.89     EKG 07/02/2019: Sinus bradycardia at 52 bpm, normal axis, no evidence of ischemia.  Recommendations:   I discussed her recent carotid duplex results, she is on appropriate medical therapy.  Lipids are well controlled.  I feel that progression of bilateral carotid stenosis is purely related to her continued smoking.  She was extensively counseled on the importance of quitting smoking to further reduce her risk.  She has failed medications in the past.  She plans to quit on her own.  She will need repeat carotid duplex in 6 months for continued surveillance.  In regards to her chest pain, symptoms are suggestive of musculoskeletal etiology given tenderness with palpation of her chest wall.  I encouraged her to use heat and ice to her chest.  She also has history of GERD, I have asked her to increase her PPI to twice a day for the next few weeks to see if this will also help.  Do not suspect angina equivalents.  Previously chest pain that was suggestive of angina improved with Imdur.  She is also previously had normal coronary angiogram in 2019; however, suspect small vessel disease given her long history of tobacco use.  She is noted to have newly noted systolic aortic murmur on physical exam, will obtain echocardiogram for further evaluation.  I will plan to see her back in 4 weeks for telephone appointment to discuss her echo results and see her progress on smoking cessation.  Miquel Dunn, MSN, APRN, FNP-C [facility]. [place] Office: [phone] Fax: [phone]

## 2019-10-16 ENCOUNTER — Ambulatory Visit (INDEPENDENT_AMBULATORY_CARE_PROVIDER_SITE_OTHER): Payer: Medicare Other

## 2019-10-16 DIAGNOSIS — I358 Other nonrheumatic aortic valve disorders: Secondary | ICD-10-CM

## 2019-10-16 DIAGNOSIS — I340 Nonrheumatic mitral (valve) insufficiency: Secondary | ICD-10-CM

## 2019-11-12 ENCOUNTER — Encounter: Payer: Self-pay | Admitting: Cardiology

## 2019-11-12 ENCOUNTER — Other Ambulatory Visit: Payer: Self-pay

## 2019-11-12 ENCOUNTER — Telehealth: Payer: Self-pay

## 2019-11-12 ENCOUNTER — Telehealth: Payer: Medicare Other | Admitting: Cardiology

## 2019-11-12 VITALS — BP 132/62 | HR 56

## 2019-11-12 DIAGNOSIS — I6523 Occlusion and stenosis of bilateral carotid arteries: Secondary | ICD-10-CM

## 2019-11-12 DIAGNOSIS — E7800 Pure hypercholesterolemia, unspecified: Secondary | ICD-10-CM

## 2019-11-12 DIAGNOSIS — R42 Dizziness and giddiness: Secondary | ICD-10-CM | POA: Diagnosis not present

## 2019-11-12 DIAGNOSIS — I739 Peripheral vascular disease, unspecified: Secondary | ICD-10-CM

## 2019-11-12 DIAGNOSIS — F17200 Nicotine dependence, unspecified, uncomplicated: Secondary | ICD-10-CM

## 2019-11-12 DIAGNOSIS — D509 Iron deficiency anemia, unspecified: Secondary | ICD-10-CM | POA: Diagnosis not present

## 2019-11-12 DIAGNOSIS — R0789 Other chest pain: Secondary | ICD-10-CM

## 2019-11-12 MED ORDER — EZETIMIBE 10 MG PO TABS: 10.0000 mg | ORAL_TABLET | Freq: Every day | ORAL | 1 refills | Status: DC

## 2019-11-12 MED ORDER — NEXLIZET 180-10 MG PO TABS: 1.0000 | ORAL_TABLET | Freq: Every day | ORAL | 2 refills | Status: DC

## 2019-11-12 NOTE — Progress Notes (Signed)
Primary Physician:  Bernerd Limbo, MD   [name], female    DOB: July 31, 1961, 59 y.o.   MRN: [id number]  Subjective:   This visit type was conducted due to national recommendations for restrictions regarding the COVID-19 Pandemic (e.g. social distancing).  This format is felt to be most appropriate for this patient at this time.  All issues noted in this document were discussed and addressed.  No physical exam was performed (except for noted visual exam findings with Telehealth visits).  The patient has consented to conduct a Telehealth visit and understands insurance will be billed.   I discussed the limitations of evaluation and management by telemedicine and the availability of in person appointments. The patient expressed understanding and agreed to proceed.  Virtual Visit via Video Note is as below  I connected with@, on 11/12/19 at 1413 by telephone and verified that I am speaking with the correct person using two identifiers. Unable to perform video visit as patient did not have equipment.    I have discussed with the patient regarding the safety during COVID Pandemic and steps and precautions including social distancing with the patient.    Chief Complaint  Patient presents with  . Results    Echocardiogram    HPI: [name]  is a 59 y.o. female  with hypertension, COPD, diastolic CHF, ongoing tobacco abuse, and difficult to control hypertension with CKD, with bilateral intrarenal angioplasty in 2015 and again underwent renal angiography in 2017 that showed  restenosis of the left renal artery and underwent scoring balloon angioplasty with 6.020 mm angiosculpt balloon.She underwent renal duplex scan for surveillance that showed restenosis in the right renal artery with high-grade velocities. She underwent repeat renal and abdominal angiogram on 10/29/2017 that showed widely patent renal arteries, but distal abdominal aorta stenosis with  diffuse disease, calcification was present for which she underwent stenting with 10x 29 mm Omnilink Elite stent. She underwent coronary angiogram in June 2019 for nitroglycerin responsive chest pain that revealed normal coronary arteries.   She underwent abdominal aortic duplex in August 2020 revealing patency abdominal aortic stent. She recently underwent carotid duplex in Jan 2021 showing progression of disease. She underwent echocardiogram for follow up on aortic systolic murmur and presents to discuss results.  She is overall doing well. States that she feels out of it and slight dizziness. No syncope. States that her recent labs have shown that she is anemic. She does mention that she had a minimal blood in her stool, but has not had any recurrence since. She has been referred back to GI and is seeing them next month.   Dyspnea is stable. Continues to have intermittent chest pain, not associated with exertion.   Unfortunately she continues to smoke about 1/2 pack per day. Hoping to work on quitting. .  She also has chronic back pain. She has been told to have severe CKD; however, on last check, kidney function is normal.   Past Medical History:  Diagnosis Date  . Anemia 2008  . Anxiety   . Aortic stenosis   . Arthritis    "all my joints ache" (09/07/2014)  . Asthma   . Bipolar 1 disorder ([place])   . Bradycardia   . Bruit   . Carpal tunnel syndrome   . Cervical cancer ([place]) 1985  . CHF (congestive heart failure) ([place])   . Chronic kidney disease (CKD), stage V ([place])   . COPD (chronic obstructive pulmonary disease) ([place]) 2000  .  Coronary artery disease   . Daily headache   . Depression 2000  . Diverticulitis 2008  . GERD (gastroesophageal reflux disease)   . GERD (gastroesophageal reflux disease)   . Glaucoma   . Heart murmur   . History of colon polyps 2009  . HLD (hyperlipidemia) 2013  . Hypertension 2013  . Hypovitaminosis D   . IBS (irritable bowel syndrome) 2008  .  Myocardial infarction ([place])    2015  . PAD (peripheral artery disease) ([place])   . Pancreatitis 10/2011  . Pneumonia 07/2014  . RLS (restless legs syndrome)   . Schizophrenia ([place])   . Shortness of breath   . Skin cancer   . Small bowel obstruction ([place]) 2008    Past Surgical History:  Procedure Laterality Date  . ABDOMINAL ANGIOGRAM N/A 09/07/2014   Procedure: ABDOMINAL ANGIOGRAM;  Surgeon: Laverda Page, MD;  Location: [facility] CATH LAB;  Service: Cardiovascular;  Laterality: N/A;  . APPENDECTOMY  1996  . COLON SURGERY  2008   6 inches of colon removed due to obstruction  . COLONOSCOPY WITH PROPOFOL N/A 10/25/2016   Procedure: COLONOSCOPY WITH PROPOFOL;  Surgeon: Milus Banister, MD;  Location: [facility] ENDOSCOPY;  Service: Endoscopy;  Laterality: N/A;  . CORONARY ANGIOPLASTY WITH STENT PLACEMENT  09/07/2014   "2"  . LEFT HEART CATH AND CORONARY ANGIOGRAPHY N/A 03/25/2018   Procedure: LEFT HEART CATH AND CORONARY ANGIOGRAPHY;  Surgeon: Nigel Mormon, MD;  Location: [place] CV LAB;  Service: Cardiovascular;  Laterality: N/A;  . LEFT HEART CATHETERIZATION WITH CORONARY ANGIOGRAM N/A 09/07/2014   Procedure: LEFT HEART CATHETERIZATION WITH CORONARY ANGIOGRAM;  Surgeon: Laverda Page, MD;  Location: [facility] CATH LAB;  Service: Cardiovascular;  Laterality: N/A;  . LOWER EXTREMITY ANGIOGRAPHY  10/29/2017   Procedure: Lower Extremity Angiography;  Surgeon: Adrian Prows, MD;  Location: [place] CV LAB;  Service: Cardiovascular;;  . PERIPHERAL VASCULAR CATHETERIZATION N/A 11/01/2015   Procedure: Renal Angiography;  Surgeon: Adrian Prows, MD;  Location: [place] CV LAB;  Service: Cardiovascular;  Laterality: N/A;  . PERIPHERAL VASCULAR CATHETERIZATION N/A 04/10/2016   Procedure: Renal Angiography;  Surgeon: Adrian Prows, MD;  Location: [place] CV LAB;  Service: Cardiovascular;  Laterality: N/A;  . PERIPHERAL VASCULAR CATHETERIZATION  04/10/2016   Procedure: Peripheral Vascular Intervention;   Surgeon: Adrian Prows, MD;  Location: [place] CV LAB;  Service: Cardiovascular;;  . PERIPHERAL VASCULAR INTERVENTION  10/29/2017   Procedure: PERIPHERAL VASCULAR INTERVENTION;  Surgeon: Adrian Prows, MD;  Location: [place] CV LAB;  Service: Cardiovascular;;  . RENAL ANGIOGRAPHY N/A 10/29/2017   Procedure: RENAL ANGIOGRAPHY;  Surgeon: Adrian Prows, MD;  Location: [place] CV LAB;  Service: Cardiovascular;  Laterality: N/A;  . RIGHT OOPHORECTOMY Right 1996  . TOTAL ABDOMINAL HYSTERECTOMY  1997    Social History   Socioeconomic History  . Marital status: Divorced    Spouse name: Not on file  . Number of children: 1  . Years of education: Not on file  . Highest education level: Not on file  Occupational History  . Occupation: Scientist, research (life sciences): UNEMPLOYED  Tobacco Use  . Smoking status: Current Every Day Smoker    Packs/day: 0.25    Years: 40.00    Pack years: 10.00    Types: Cigarettes  . Smokeless tobacco: Never Used  Substance and Sexual Activity  . Alcohol use: No  . Drug use: Yes    Frequency: 3.0 times per week    Types:  Marijuana    Comment: last use yesterday  . Sexual activity: Yes    Birth control/protection: Post-menopausal  Other Topics Concern  . Not on file  Social History Narrative  . Not on file   Social Determinants of Health   Financial Resource Strain:   . Difficulty of Paying Living Expenses: Not on file  Food Insecurity:   . Worried About Charity fundraiser in the Last Year: Not on file  . Ran Out of Food in the Last Year: Not on file  Transportation Needs:   . Lack of Transportation (Medical): Not on file  . Lack of Transportation (Non-Medical): Not on file  Physical Activity:   . Days of Exercise per Week: Not on file  . Minutes of Exercise per Session: Not on file  Stress:   . Feeling of Stress : Not on file  Social Connections:   . Frequency of Communication with Friends and Family: Not on file  . Frequency of Social  Gatherings with Friends and Family: Not on file  . Attends Religious Services: Not on file  . Active Member of Clubs or Organizations: Not on file  . Attends Archivist Meetings: Not on file  . Marital Status: Not on file  Intimate Partner Violence:   . Fear of Current or Ex-Partner: Not on file  . Emotionally Abused: Not on file  . Physically Abused: Not on file  . Sexually Abused: Not on file    Review of Systems  Constitution: Negative for decreased appetite, malaise/fatigue, weight gain and weight loss.  Eyes: Negative for visual disturbance.  Cardiovascular: Positive for chest pain, claudication and dyspnea on exertion. Negative for leg swelling, orthopnea, palpitations and syncope.  Respiratory: Negative for hemoptysis and wheezing.   Endocrine: Negative for cold intolerance and heat intolerance.  Hematologic/Lymphatic: Does not bruise/bleed easily.  Skin: Negative for nail changes.  Musculoskeletal: Negative for muscle weakness and myalgias.  Gastrointestinal: Negative for abdominal pain, change in bowel habit, nausea and vomiting.  Neurological: Positive for dizziness. Negative for difficulty with concentration, focal weakness and headaches.  Psychiatric/Behavioral: Positive for depression. Negative for altered mental status and suicidal ideas.  All other systems reviewed and are negative.     Objective:  Blood pressure 132/62, pulse (!) 56. There is no height or weight on file to calculate BMI.    Physical exam not performed or limited due to virtual visit.  Please see exam details from prior visit is as below.   Physical Exam  Constitutional: She is oriented to person, place, and time. Vital signs are normal. She appears well-developed and well-nourished.  HENT:  Head: Normocephalic and atraumatic.  Cardiovascular: Normal rate, regular rhythm and intact distal pulses.  Murmur heard.  Early systolic murmur is present with a grade of 2/6 at the upper right  sternal border and upper left sternal border. Pulses:      Carotid pulses are on the right side with bruit and on the left side with bruit.      Femoral pulses are 2+ on the right side with bruit and 2+ on the left side with bruit.      Popliteal pulses are 1+ on the right side and 1+ on the left side.       Dorsalis pedis pulses are 1+ on the right side and 1+ on the left side.       Posterior tibial pulses are 1+ on the right side and 1+ on the left side.  Pulmonary/Chest:  Effort normal and breath sounds normal. No accessory muscle usage. No respiratory distress. She exhibits tenderness.  Prolonged expiration, barrel shaped chest  Abdominal: Soft. Bowel sounds are normal. She exhibits abdominal bruit.  Musculoskeletal:        General: Normal range of motion.     Cervical back: Normal range of motion.  Neurological: She is alert and oriented to person, place, and time.  Skin: Skin is warm and dry.  Vitals reviewed.  Radiology: No results found.  Laboratory examination:    CMP Latest Ref Rng & Units 06/05/2017 11/09/2015 11/02/2015  Glucose 65 - 99 mg/dL 68 111(H) 153(H)  BUN 6 - 20 mg/dL 16 12 23(H)  Creatinine 0.44 - 1.00 mg/dL 1.51(H) 1.11(H) 1.58(H)  Sodium 135 - 145 mmol/L 134(L) 137 134(L)  Potassium 3.5 - 5.1 mmol/L 4.5 4.8 3.6  Chloride 101 - 111 mmol/L 99(L) 107 93(L)  CO2 22 - 32 mmol/L 27 19(L) 28  Calcium 8.9 - 10.3 mg/dL 9.7 9.9 8.8(L)  Total Protein 6.5 - 8.1 g/dL - - -  Total Bilirubin 0.3 - 1.2 mg/dL - - -  Alkaline Phos 38 - 126 U/L - - -  AST 15 - 41 U/L - - -  ALT 14 - 54 U/L - - -   CBC Latest Ref Rng & Units 06/05/2017 11/09/2015 11/02/2015  WBC 4.0 - 10.5 K/uL 7.0 7.6 10.5  Hemoglobin 12.0 - 15.0 g/dL 11.6(L) 11.3(L) 9.3(L)  Hematocrit 36.0 - 46.0 % 35.9(L) 33.9(L) 29.2(L)  Platelets 150 - 400 K/uL 402(H) 523(H) 314   Lipid Panel     Component Value Date/Time   CHOL 134 10/14/2019 1408   TRIG 81 10/14/2019 1408   HDL 54 10/14/2019 1408   CHOLHDL 2.5  10/14/2019 1408   CHOLHDL 3.7 10/08/2012 0228   VLDL 30 10/08/2012 0228   LDLCALC 64 10/14/2019 1408   HEMOGLOBIN A1C No results found for: HGBA1C, MPG TSH No results for input(s): TSH in the last 8760 hours.  External labs 11/05/2019:  Glucose 101 (H) 65 - 99 mg/dL  BUN 13 6 - 24 mg/dL  Creatinine, Serum 0.90 0.57 - 1 mg/dL  eGFR If NonAfrican American 71 >59 mL/min/1.73  eGFR If African American 82 >59 mL/min/1.73  BUN/Creatinine Ratio 14 9 - 23   Sodium 138 134 - 144 mmol/L  Potassium 5.8 (H) 3.5 - 5.2 mmol/L  Chloride 102 96 - 106 mmol/L  CO2 22 20 - 29 mmol/L  CALCIUM 10.4 (H) 8.7 - 10.2 mg/dL  Total Protein 7.0 6 - 8.5 g/dL  Albumin, Serum 4.6 3.8 - 4.9 g/dL  Globulin, Total 2.4 1.5 - 4.5 g/dL  Albumin/Globulin Ratio 1.9 1.2 - 2.2   Total Bilirubin 0.2 0 - 1.2 mg/dL  Alkaline Phosphatase 117 39 - 117 IU/L  AST 25 0 - 40 IU/L  ALT (SGPT) 9 0 - 32 IU/L   TIBC 493 (H) 250 - 450 ug/dL [facility]   UIBC 132 131 - 425 ug/dL [facility]   Iron 361 (H) 27 - 159 ug/dL [facility]   Iron Saturation 73 (H) 15 - 55 % [facility]    WBC 6.8 4.5 - 11.0 x 10^3/uL [facility]   % LYMPH 25.9 25.0 - 40.0 % [facility]   MONO % 7.4 4.0 - 12.0 % [facility]   % GRAN 66.7 50.0 - 73.0 % [facility]   Lymphs Absolute 1.8 1.0 -  4.5 x 10^3/uL [facility]   Monos Absolute 0.5 0.1 - 1.0 x 10^3/uL [facility]   Grans Absolute 4.5 1.5 - 7.5 x 10^3/uL [facility]   RBC 3.28 (A) 4.00 - 5.20 x10E6/uL [facility]   HGB 7.1 (A) 12.0 - 15.0 g/dL [facility]   Hematocrit 24.8 (A) 35.8 - 47.9 % [facility]   MCV 75.5 (A) 82.0 - 98.0 fL [facility]   MCH 21.5 (A) 26.0 - 34.0 pg [facility]   MCHC 28.5 (A) 31.0 - 37.0 g/dL [facility]   RDW 19.3 (A) 11.7 - 16.5 % [facility]   Platelet Count 460 (A) 150 - 400 x 10^3/uL [facility]   MPV 6.8 (A) 7.1 - 10.5 fL [facility]    Cholesterol, Total 187 100 - 199 mg/dL [facility]   Triglycerides 120 0 - 149 mg/dL [facility]   HDL 47 >39 mg/dL [facility]   VLDL Cholesterol Cal 22 5 - 40 mg/dL [facility]   LDL 118 (H) 0 - 99 mg/dL [facility]   LDL/HDL Ratio 2.5 Comment:                   LDL/HDL Ratio                      Men Women               1/2 Avg.Risk 1.0  1.5                 Avg.Risk 3.6  3.2                2X Avg.Risk 6.2  5.0                3X Avg.Risk 8.0  6.1  0.0 - 3.2 ratio       PRN Meds:. Medications Discontinued During This Encounter  Medication Reason  . cetirizine (ZYRTEC) 10 MG tablet Error  . aspirin EC 81 MG tablet Discontinued by provider  . Bempedoic Acid-Ezetimibe (NEXLIZET) 180-10 MG TABS Discontinued by provider   Current Meds  Medication Sig  . acetaminophen (TYLENOL) 500 MG tablet Take 1,000 mg by mouth every 8 (eight) hours as needed for mild pain or headache.  . albuterol (PROVENTIL HFA;VENTOLIN HFA) 108 (90 BASE) MCG/ACT inhaler Inhale 2 puffs into the lungs 2 (two) times daily as needed (asthma).   . ALPRAZolam (XANAX) 1 MG tablet Take 1 tablet by mouth 2 (two) times daily as needed for anxiety.   Marland Kitchen atenolol (TENORMIN) 25 MG tablet Take 1 tablet (25 mg total) by mouth 2 (two) times daily.  Marland Kitchen atorvastatin (LIPITOR) 80 MG tablet Take 80 mg by mouth daily.   Marland Kitchen azelastine (ASTELIN) 0.1 % nasal spray Place 1 spray into both nostrils daily at 2 PM.  . Calcium Carb-Cholecalciferol (CALCIUM-VITAMIN D) 600-400 MG-UNIT TABS Take 1 tablet by mouth daily.  . clopidogrel (PLAVIX) 75 MG tablet TAKE 1 TABLET BY MOUTH ONCE A DAY  . gabapentin (NEURONTIN) 400 MG  capsule Take 1 capsule (400 mg total) by mouth 2 (two) times daily. Patient takes 400 mg 3 times daily and 1200 mg at bedtime (Patient taking differently: Take 400 mg by mouth at bedtime. )  .  isosorbide mononitrate (IMDUR) 60 MG 24 hr tablet Take 1 tablet (60 mg total) by mouth daily.  . mometasone-formoterol (DULERA) 100-5 MCG/ACT AERO Take 2 puffs first thing in am and then another 2 puffs about 12 hours later.  . Multiple Vitamin (MULTIVITAMIN WITH MINERALS) TABS tablet Take 1 tablet by mouth daily.  . nitroGLYCERIN (NITROSTAT) 0.4 MG SL tablet Place 1 tablet (0.4 mg total) under the tongue every 5 (five) minutes as needed for chest pain.  . pantoprazole (PROTONIX) 40 MG tablet Take 1 tablet (40 mg total) by mouth 2 (two) times daily.  Marland Kitchen venlafaxine XR (EFFEXOR XR) 150 MG 24 hr capsule Take 1 capsule (150 mg total) by mouth daily with breakfast. (Patient taking differently: Take 300 mg by mouth daily with breakfast. )  . vitamin B-12 (CYANOCOBALAMIN) 500 MCG tablet Take 500 mcg by mouth daily.  . [DISCONTINUED] aspirin EC 81 MG tablet Take 81 mg by mouth daily.    Cardiac Studies:   Echocardiogram 10/16/2019:  Normal LV systolic function with EF 60%. Left ventricle cavity is normal  in size. Normal global wall motion. Normal diastolic filling pattern.  Calculated EF 60%.  Left atrial cavity is mildly dilated in 4 chamber views.  Structurally normal mitral valve. Mild (Grade I) mitral regurgitation.  Structurally normal tricuspid valve. Mild tricuspid regurgitation. No  evidence of pulmonary hypertension.  Compared to 02/25/2017, no significant change.   Carotid artery duplex  10/09/2019: Doppler flow velocities in the right internal carotid artery are consistent with stenosis in the range of 50-69% with >= 50% heterogeneous plaque. Doppler flow velocities in the left internal carotid artery are consistent with stenosis in the range of 50-69% with >= 50% heterogeneous plaque. Antegrade  right vertebral artery flow. Antegrade left vertebral artery flow. Compared to the study done on 04/17/2019, this progression of disease, right ICA stenosis is new. Follow up in six months is appropriate if clinically indicated.  Abdominal Aortic Duplex  05/27/2019: Moderate plaque noted in the proximal, mid and distal aorta.  No abdominal aortic aneurysm. Distal abdominal aortic velocity is mildly elevated suggestive of <50% stenosis. Patient has h/o distal abdominal aortic stenosis and stenting on 10/29/2017. The stent appears patent.   Lower extremity arterial duplex 12/05/2018: No hemodynamically significant stenoses are identified in the lower extremity arterial system.  This exam reveals moderately decreased perfusion of the right lower extremity, noted at the dorsalis pedis and post tibial artery level (ABI 0.68) and mildly decreased perfusion of the left lower extremity, noted at the post tibial artery level (ABI 0.83). Triphasic waveform the the major vessels suggest diffuse small vessel disease. Left proximal SFA has < 50% stenosis.  208/06/2016, mild progression of PAD with decreased ABI bilaterally compared to left ABI 0.95 and right ABI 0.94.  Lexiscan myoview stress test 02/17/2018: 1. Lexiscan stress test was performed. Exercise capacity was not assessed. Stress symptoms included dizziness, nausea, headache, and chest pressure.. Peak blood pressure was 176/88 mmHg. Stress EKG is non diagnostic for ischemia as it is a pharmacologic stress. In addition, it showed The stress electrocardiogram showed sinus tachycardia, normal stress conduction, left ventricular hypertrophy, no stress arrhythmias and normal stress repolarization. 2. The overall quality of the study is excellent. Left ventricular cavity is noted to be normal on the rest and stress studies. Gated SPECT images reveal normal myocardial thickening and wall motion. The left ventricular ejection fraction was calculated or visually  estimated to be 78%. SPECT images demonstrate small perfusion abnormality of moderate intensity  in the mid anterolateral and apical lateral myocardial wall(s) on the stress images, reversible on rest images. Findings suggest small area of moderate intensity ischemia in mid to apical anterolateral myocardium. 3. Intermediate risk study.  03/25/2018-cardiac cath.-normal coronary arteries.  abdominal aortogram 10/29/2017: Widely patent renal arteries. Distal abdominal aorta diffuse disease, calcification with 80% stenosis S/P 10 x 29 mm Omnilink Elite stent, 80% to 0%.  Renal arteriogram 02/29/2018: Widely patient stents. 04/10/2016: Scoring balloon angioplasty left in-stent restenosis with 6 x 20 mm angiosculpt balloon.  Right renal artery stent widely patent. H/O bilateral renal stenting on 09/07/2014: Right renal artery 6.0 x 15 mm Herculink widely patent, left renal artery  6.0 x 18 mm Herculink stent.  Renal artery duplex 09/18/2017: Hemodynamically significant stenosis of the right renal artery. Peak Velocity of 308/66 cm/s. Normal intrarenal vascular perfusion is noted in both kidneys. There is increased echogenecity of both kidneys suggests medico-renal disease with right kidney slightly shrunk at 8.03x3.97x4 cm compared to 9.144.664.6 cm Diffuse plaque noted in the abdominal aorta with calcification.  Compared to the study done on 01/22/2017, left renal artery stenosis not evident, right renal artery stenosis new.  Patient has h/o bilateral renal artery stenting.  Assessment:     ICD-10-CM   1. Microcytic anemia  D50.9   2. Atypical chest pain  R07.89   3. Dizziness  R42   4. Asymptomatic bilateral carotid artery stenosis  I65.23   5. Peripheral arterial disease (HCC)  I73.9   6. Pure hypercholesterolemia  E78.00   7. Tobacco use disorder  F17.200     EKG 07/02/2019: Sinus bradycardia at 52 bpm, normal axis, no evidence of ischemia.  Recommendations:   I have reviewed and  discussed her echocardiogram results, suspect aortic murmur is systolic ejection murmur likely related to anemia.  I am concerned regarding her microcytic anemia and also that she had minimal mild blood in her stool a few weeks ago, fortunately has not had recurrence.  She is to follow-up with her PCP next week and is being seen by GI next month.  As her stenting has been over 1 year ago, I will discontinue aspirin and also hold her Plavix for now pending further GI work-up.  Once anemia stabilizes and she has undergone further evaluation with GI, will consider resuming her Plavix only in view of her PAD and carotid disease.  I suspect her dizziness is related to her anemia as well.  No syncope.  Continues to have intermittent episodes of chest discomfort, but nothing like prior to being on Imdur.  Continue with as needed nitroglycerin use.  I suspect her chest discomfort may also be noncardiac related, potentially also contributed by anemia and GERD.  I have reviewed her other labs from PCP, although her lipids were well controlled January, I recently panel, her LDL is uncontrolled.  She is on max dose of Lipitor, will add Zetia 10 mg daily.  She may need Nexletol as well but will first try Zetia.  Also Nexletol has potential to worsen anemia; therefore, I have not started this.  Blood pressure is well controlled, continue with antihypertensive therapy.  She continues to need aggressive risk factor modification with smoking cessation.  This was again stressed to her today.  She has failed medical therapy in the past, states she will have to quit on her own.  We have discussed ways to try to cut back on cigarette use.  We will continue to closely monitor her progress.  Would like  to see her back in 3 months for follow-up, but encouraged her to contact me sooner if needed.  Miquel Dunn, MSN, APRN, FNP-C [facility]. [place] Office: [phone] Fax: [phone]

## 2019-11-16 ENCOUNTER — Telehealth: Payer: Self-pay

## 2019-11-16 NOTE — Telephone Encounter (Signed)
Nexlizet has been approved  Through 09/30/20 from [facility]

## 2019-11-17 NOTE — Telephone Encounter (Signed)
error 

## 2019-12-11 ENCOUNTER — Other Ambulatory Visit: Payer: Self-pay

## 2019-12-15 ENCOUNTER — Ambulatory Visit: Payer: Medicare Other | Admitting: Gastroenterology

## 2020-01-07 ENCOUNTER — Encounter: Payer: Self-pay | Admitting: Radiology

## 2020-01-14 ENCOUNTER — Other Ambulatory Visit: Payer: Self-pay | Admitting: Cardiology

## 2020-01-25 ENCOUNTER — Ambulatory Visit (INDEPENDENT_AMBULATORY_CARE_PROVIDER_SITE_OTHER): Payer: Medicare Other | Admitting: Obstetrics and Gynecology

## 2020-01-25 ENCOUNTER — Other Ambulatory Visit: Payer: Self-pay

## 2020-01-25 ENCOUNTER — Other Ambulatory Visit (HOSPITAL_COMMUNITY)
Admission: RE | Admit: 2020-01-25 | Discharge: 2020-01-25 | Disposition: A | Discharge status: Home / Self Care | Payer: Medicare Other | Source: Ambulatory Visit | Attending: Obstetrics and Gynecology | Admitting: Obstetrics and Gynecology

## 2020-01-25 ENCOUNTER — Encounter: Payer: Self-pay | Admitting: Obstetrics and Gynecology

## 2020-01-25 VITALS — BP 113/66 | HR 60 | Ht 61.75 in | Wt 112.0 lb

## 2020-01-25 DIAGNOSIS — Z1151 Encounter for screening for human papillomavirus (HPV): Secondary | ICD-10-CM | POA: Diagnosis not present

## 2020-01-25 DIAGNOSIS — Z1272 Encounter for screening for malignant neoplasm of vagina: Secondary | ICD-10-CM | POA: Insufficient documentation

## 2020-01-25 DIAGNOSIS — L989 Disorder of the skin and subcutaneous tissue, unspecified: Secondary | ICD-10-CM

## 2020-01-25 DIAGNOSIS — Z90710 Acquired absence of both cervix and uterus: Secondary | ICD-10-CM | POA: Diagnosis not present

## 2020-01-25 DIAGNOSIS — A630 Anogenital (venereal) warts: Secondary | ICD-10-CM | POA: Insufficient documentation

## 2020-01-25 NOTE — Progress Notes (Signed)
Obstetrics and Gynecology New Patient Evaluation  Appointment Date: 01/25/2020  OBGYN Clinic: [facility]  Primary Care Provider: Bernerd Limbo  Referring Provider: Bernerd Limbo, MD  Chief Complaint:  Chief Complaint  Patient presents with  . Mass    on vagina and buttocks    History of Present Illness: [name] is a 59 y.o. Caucasian G2P0011 (No LMP recorded. Patient has had a hysterectomy.), seen for the above chief complaint.   Patient felt when wiping some bumps on her bottom in March 2021; she believes she may have had some a long time ago and that it resolved on its own.  She recently went to her PCP and was put on abx and referred to [place]; she believes the abx helped.   Bumps were never tender or painful   Review of Systems: Pertinent items are noted in HPI.   Patient Active Problem List   Diagnosis Date Noted  . Abdominal aortic stenosis 11/30/2018  . Pure hypercholesterolemia 10/22/2018  . Abnormal stress test 03/21/2018  . At high risk for injury related to fall 12/26/2017  . Renal artery stenosis, native, bilateral ([place]) 10/27/2017  . Benign neoplasm of transverse colon   . Benign neoplasm of descending colon   . Benign neoplasm of sigmoid colon   . Abnormality on screening test 06/22/2016  . Chest pain 11/01/2015  . History of partial colectomy 10/19/2014  . 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 Anemia, chronic disease 07/30/2014  . Heart failure with preserved ejection fraction ([place]) 07/02/2014  . COPD GOLD 0 / still smoking 07/02/2014  . CKD (chronic kidney disease) stage 3, GFR 30-59 ml/min 07/02/2014  . Protein-calorie malnutrition, severe ([place]) 07/02/2014  . Hyperparathyroidism, secondary renal ([place]) 04/30/2014  . Postsurgical menopause 08/14/2013  . Vaginal atrophy 08/14/2013  . Screening for breast cancer 07/21/2012  . Diverticulitis of colon with perforation 06/18/2012  . Affective bipolar  disorder ([place]) 02/02/2009  . ADHESIONS, INTESTINAL W/OBSTRUCTION 08/10/2008  . Irritable bowel syndrome 08/10/2008  . Abdominal pain, generalized 08/10/2008  . FECAL OCCULT BLOOD 08/10/2008  . Anemia, iron deficiency 08/10/2008  . Anxiety state 08/09/2008  . DEPRESSION 08/09/2008  . Essential hypertension 08/09/2008  . ASTHMA 08/09/2008  . ESOPHAGEAL STRICTURE 08/09/2008  . GERD 08/09/2008  . HIATAL HERNIA 08/09/2008  . Diverticulosis of large intestine 08/09/2008  . ARTHRITIS 08/09/2008  . HEADACHE, CHRONIC 08/09/2008  . SKIN CANCER, HX OF 08/09/2008  . COLONIC POLYPS, HYPERPLASTIC, HX OF 08/09/2008  . Cigarette smoker 05/17/2008  . Schizophrenia ([place]) 04/30/2008    Past Medical History:  Past Medical History:  Diagnosis Date  . Anemia 2008  . Anxiety   . Aortic stenosis   . Arthritis    "all my joints ache" (09/07/2014)  . Asthma   . Bipolar 1 disorder ([place])   . Bradycardia   . Bruit   . Carpal tunnel syndrome   . Cervical cancer ([place]) 1985  . CHF (congestive heart failure) ([place])   . Chronic kidney disease (CKD), stage V ([place])   . COPD (chronic obstructive pulmonary disease) ([place]) 2000  . Coronary artery disease   . Daily headache   . Depression 2000  . Diverticulitis 2008  . GERD (gastroesophageal reflux disease)   . Glaucoma   . Heart murmur   . History of colon polyps 2009  . HLD (hyperlipidemia) 2013  . Hypertension 2013  . Hypovitaminosis D   . IBS (irritable bowel syndrome) 2008  . Myocardial infarction ([place])  2015  . PAD (peripheral artery disease) ([place])   . Pancreatitis 10/2011  . Pneumonia 07/2014  . RLS (restless legs syndrome)   . Schizophrenia ([place])   . Shortness of breath   . Skin cancer   . Small bowel obstruction ([place]) 2008    Past Surgical History:  Past Surgical History:  Procedure Laterality Date  . ABDOMINAL ANGIOGRAM N/A 09/07/2014   Procedure: ABDOMINAL ANGIOGRAM;  Surgeon: Laverda Page, MD;  Location: [facility] CATH LAB;   Service: Cardiovascular;  Laterality: N/A;  . APPENDECTOMY  1996  . COLON SURGERY  2008   6 inches of colon removed due to obstruction  . COLONOSCOPY WITH PROPOFOL N/A 10/25/2016   Procedure: COLONOSCOPY WITH PROPOFOL;  Surgeon: Milus Banister, MD;  Location: [facility] ENDOSCOPY;  Service: Endoscopy;  Laterality: N/A;  . CORONARY ANGIOPLASTY WITH STENT PLACEMENT  09/07/2014   "2"  . LEFT HEART CATH AND CORONARY ANGIOGRAPHY N/A 03/25/2018   Procedure: LEFT HEART CATH AND CORONARY ANGIOGRAPHY;  Surgeon: Nigel Mormon, MD;  Location: [place] CV LAB;  Service: Cardiovascular;  Laterality: N/A;  . LEFT HEART CATHETERIZATION WITH CORONARY ANGIOGRAM N/A 09/07/2014   Procedure: LEFT HEART CATHETERIZATION WITH CORONARY ANGIOGRAM;  Surgeon: Laverda Page, MD;  Location: [facility] CATH LAB;  Service: Cardiovascular;  Laterality: N/A;  . LOWER EXTREMITY ANGIOGRAPHY  10/29/2017   Procedure: Lower Extremity Angiography;  Surgeon: Adrian Prows, MD;  Location: [place] CV LAB;  Service: Cardiovascular;;  . PERIPHERAL VASCULAR CATHETERIZATION N/A 11/01/2015   Procedure: Renal Angiography;  Surgeon: Adrian Prows, MD;  Location: [place] CV LAB;  Service: Cardiovascular;  Laterality: N/A;  . PERIPHERAL VASCULAR CATHETERIZATION N/A 04/10/2016   Procedure: Renal Angiography;  Surgeon: Adrian Prows, MD;  Location: [place] CV LAB;  Service: Cardiovascular;  Laterality: N/A;  . PERIPHERAL VASCULAR CATHETERIZATION  04/10/2016   Procedure: Peripheral Vascular Intervention;  Surgeon: Adrian Prows, MD;  Location: [place] CV LAB;  Service: Cardiovascular;;  . PERIPHERAL VASCULAR INTERVENTION  10/29/2017   Procedure: PERIPHERAL VASCULAR INTERVENTION;  Surgeon: Adrian Prows, MD;  Location: [place] CV LAB;  Service: Cardiovascular;;  . RENAL ANGIOGRAPHY N/A 10/29/2017   Procedure: RENAL ANGIOGRAPHY;  Surgeon: Adrian Prows, MD;  Location: [place] CV LAB;  Service: Cardiovascular;  Laterality: N/A;  . RIGHT OOPHORECTOMY Right  1996  . TOTAL ABDOMINAL HYSTERECTOMY  1997    Past Obstetrical History:  OB History  Gravida Para Term Preterm AB Living  2       1 1   SAB TAB Ectopic Multiple Live Births      1   1    # Outcome Date GA Lbr Len/2nd Weight Sex Delivery Anes PTL Lv  2 Gravida           1 Ectopic             Past Gynecological History: As per HPI. No h/o VB or spotting since hysterectomy  Social History:  Social History   Socioeconomic History  . Marital status: Divorced    Spouse name: Not on file  . Number of children: 1  . Years of education: Not on file  . Highest education level: Not on file  Occupational History  . Occupation: Scientist, research (life sciences): UNEMPLOYED  Tobacco Use  . Smoking status: Current Every Day Smoker    Packs/day: 0.25    Years: 40.00    Pack years: 10.00    Types: Cigarettes  . Smokeless tobacco: Never Used  Substance and Sexual Activity  . Alcohol use: No  . Drug use: Yes    Frequency: 3.0 times per week    Types: Marijuana    Comment: last use yesterday  . Sexual activity: Yes    Birth control/protection: Post-menopausal  Other Topics Concern  . Not on file  Social History Narrative  . Not on file   Social Determinants of Health   Financial Resource Strain:   . Difficulty of Paying Living Expenses:   Food Insecurity:   . Worried About Charity fundraiser in the Last Year:   . Arboriculturist in the Last Year:   Transportation Needs:   . Film/video editor (Medical):   Marland Kitchen Lack of Transportation (Non-Medical):   Physical Activity:   . Days of Exercise per Week:   . Minutes of Exercise per Session:   Stress:   . Feeling of Stress :   Social Connections:   . Frequency of Communication with Friends and Family:   . Frequency of Social Gatherings with Friends and Family:   . Attends Religious Services:   . Active Member of Clubs or Organizations:   . Attends Archivist Meetings:   Marland Kitchen Marital Status:   Intimate Partner  Violence:   . Fear of Current or Ex-Partner:   . Emotionally Abused:   Marland Kitchen Physically Abused:   . Sexually Abused:     Family History:  Family History  Problem Relation Age of Onset  . Other Mother        many bowel obstructions  . Heart disease Mother   . Kidney cancer Father   . Bone cancer Father   . Diabetes Father   . Heart disease Father   . Diabetes Daughter   . Heart disease Brother     Medications Robbi C. Stegman had no medications administered during this visit. Current Outpatient Medications  Medication Sig Dispense Refill  . acetaminophen (TYLENOL) 500 MG tablet Take 1,000 mg by mouth every 8 (eight) hours as needed for mild pain or headache.    . albuterol (PROVENTIL HFA;VENTOLIN HFA) 108 (90 BASE) MCG/ACT inhaler Inhale 2 puffs into the lungs 2 (two) times daily as needed (asthma).     . ALPRAZolam (XANAX) 1 MG tablet Take 1 tablet by mouth 2 (two) times daily as needed for anxiety.     Marland Kitchen atenolol (TENORMIN) 25 MG tablet Take 1 tablet (25 mg total) by mouth 2 (two) times daily. 180 tablet 1  . atorvastatin (LIPITOR) 80 MG tablet Take 80 mg by mouth daily.     Marland Kitchen atorvastatin (LIPITOR) 80 MG tablet TAKE 1 TABLET BY MOUTH ONCE A DAY. NEED APPT FOR REFILLS.    Marland Kitchen azelastine (ASTELIN) 0.1 % nasal spray Place 1 spray into both nostrils daily at 2 PM.    . Calcium Carb-Cholecalciferol (CALCIUM-VITAMIN D) 600-400 MG-UNIT TABS Take 1 tablet by mouth daily.    . clopidogrel (PLAVIX) 75 MG tablet TAKE 1 TABLET BY MOUTH ONCE A DAY 90 tablet 3  . ezetimibe (ZETIA) 10 MG tablet TAKE 1 TABLET BY MOUTH ONCE DAILY 30 tablet 1  . gabapentin (NEURONTIN) 400 MG capsule Take 1 capsule (400 mg total) by mouth 2 (two) times daily. Patient takes 400 mg 3 times daily and 1200 mg at bedtime (Patient taking differently: Take 400 mg by mouth at bedtime. )    . mometasone-formoterol (DULERA) 100-5 MCG/ACT AERO Take 2 puffs first thing in am and then another 2 puffs  about 12 hours later. 1 Inhaler  11  . Multiple Vitamin (MULTIVITAMIN WITH MINERALS) TABS tablet Take 1 tablet by mouth daily.    . nitroGLYCERIN (NITROSTAT) 0.4 MG SL tablet Place 1 tablet (0.4 mg total) under the tongue every 5 (five) minutes as needed for chest pain. 30 tablet 1  . pantoprazole (PROTONIX) 40 MG tablet Take 1 tablet (40 mg total) by mouth 2 (two) times daily. 30 tablet 3  . venlafaxine XR (EFFEXOR XR) 150 MG 24 hr capsule Take 1 capsule (150 mg total) by mouth daily with breakfast. (Patient taking differently: Take 300 mg by mouth daily with breakfast. ) 30 capsule 0  . vitamin B-12 (CYANOCOBALAMIN) 500 MCG tablet Take 500 mcg by mouth daily.    . isosorbide mononitrate (IMDUR) 60 MG 24 hr tablet Take 1 tablet (60 mg total) by mouth daily. 90 tablet 3   No current facility-administered medications for this visit.    Allergies Doxycycline, Hydrocodone-acetaminophen, and Iohexol   Physical Exam:  BP 113/66   Pulse 60   Ht 5' 1.75" (1.568 m)   Wt 112 lb (50.8 kg)   BMI 20.65 kg/m  Body mass index is 20.65 kg/m. General appearance: Well nourished, well developed female in no acute distress.  Respiratory:  Normal respiratory effort Abdomen:  no masses, hernias; diffusely non tender to palpation, non distended Neuro/Psych:  Normal mood and affect.  Skin:  Warm and dry.  Lymphatic:  No inguinal lymphadenopathy.   Pelvic exam: is not limited by body habitus EGBUS: within normal limits Vagina: within normal limits and with no blood or discharge in the vault. Normal cuff, nttp Cervix: surgically absent Uterus:  Surgically absent Adnexa:  normal adnexa and no mass, fullness, tenderness Rectovaginal: deferred  Pt notes three areas. At the area just to the right near the clitoral hood, I don't see or feel anything and pt doesn't feel spot anymore. There are two spots that are mirror of each other. One is approximately 4-5cm from the rectum at 2:30; it looks like a small blackhead, nttp. Same area on the  right looks normal.   Laboratory: none  Radiology: none  Assessment: pt doing well  Plan:  1. Bumps on skin No concern. Pt to let us know if becomes symptomatic or more develop. I also told her if pap is negative then she likely doesn't need anymore.  - HIV antibody (with reflex) - Cytology - PAP( [place])  Orders Placed This Encounter  Procedures  . HIV antibody (with reflex)    RTC PRN  Durene Romans MD Attending [facility])

## 2020-01-27 LAB — CYTOLOGY - PAP
Chlamydia: NEGATIVE
Comment: NEGATIVE
Comment: NEGATIVE
Comment: NEGATIVE
Comment: NORMAL
Diagnosis: NEGATIVE
High risk HPV: NEGATIVE
Neisseria Gonorrhea: NEGATIVE
Trichomonas: NEGATIVE

## 2020-02-08 NOTE — Progress Notes (Signed)
Primary Physician/Referring:  Bernerd Limbo, MD  [name], female    DOB: [birth year], 59 y.o.   MRN: [id number]  Chief Complaint  Patient presents with  . PAD  . Tobacco Use  . Microcytic Anemia   HPI:    Khaleelah Yowell  is a 59 y.o. Caucasian female  with hypertension, COPD, diastolic CHF, ongoing tobacco abuse (2 cig a day), and difficult to control hypertension with CKD, with bilateral renal artery angioplasty, PAD with distal abdominal aorta stenosis S/P stenting with 10x 29 mm Omnilink Elite stent. She underwent coronary angiogram in June 2019 for nitroglycerin responsive chest pain that revealed normal coronary arteries.   Dyspnea is stable. Continues to have intermittent chest pain, not associated with exertion and nitrate responsive almost daily but has not stopped her from activity. Unfortunately she continues to smoke about 2 cigarettes per day (reduced from 1/2 pack last OV. Hoping to work on quitting.    Past Medical History:  Diagnosis Date  . Anemia 2008  . Anxiety   . Aortic stenosis   . Arthritis    "all my joints ache" (09/07/2014)  . Asthma   . Bipolar 1 disorder ([place])   . Bradycardia   . Bruit   . Carpal tunnel syndrome   . Cervical cancer ([place]) 1985  . CHF (congestive heart failure) ([place])   . Chronic kidney disease (CKD), stage V ([place])   . COPD (chronic obstructive pulmonary disease) ([place]) 2000  . Coronary artery disease   . Daily headache   . Depression 2000  . Diverticulitis 2008  . GERD (gastroesophageal reflux disease)   . Glaucoma   . Heart murmur   . History of colon polyps 2009  . HLD (hyperlipidemia) 2013  . Hypertension 2013  . Hypovitaminosis D   . IBS (irritable bowel syndrome) 2008  . Myocardial infarction ([place])    2015  . PAD (peripheral artery disease) ([place])   . Pancreatitis 10/2011  . Pneumonia 07/2014  . RLS (restless legs syndrome)   . Schizophrenia ([place])   . Shortness of breath   . Skin  cancer   . Small bowel obstruction ([place]) 2008   Past Surgical History:  Procedure Laterality Date  . ABDOMINAL ANGIOGRAM N/A 09/07/2014   Procedure: ABDOMINAL ANGIOGRAM;  Surgeon: Laverda Page, MD;  Location: [facility] CATH LAB;  Service: Cardiovascular;  Laterality: N/A;  . APPENDECTOMY  1996  . COLON SURGERY  2008   6 inches of colon removed due to obstruction  . COLONOSCOPY WITH PROPOFOL N/A 10/25/2016   Procedure: COLONOSCOPY WITH PROPOFOL;  Surgeon: Milus Banister, MD;  Location: [facility] ENDOSCOPY;  Service: Endoscopy;  Laterality: N/A;  . CORONARY ANGIOPLASTY WITH STENT PLACEMENT  09/07/2014   "2"  . LEFT HEART CATH AND CORONARY ANGIOGRAPHY N/A 03/25/2018   Procedure: LEFT HEART CATH AND CORONARY ANGIOGRAPHY;  Surgeon: Nigel Mormon, MD;  Location: [place] CV LAB;  Service: Cardiovascular;  Laterality: N/A;  . LEFT HEART CATHETERIZATION WITH CORONARY ANGIOGRAM N/A 09/07/2014   Procedure: LEFT HEART CATHETERIZATION WITH CORONARY ANGIOGRAM;  Surgeon: Laverda Page, MD;  Location: [facility] CATH LAB;  Service: Cardiovascular;  Laterality: N/A;  . LOWER EXTREMITY ANGIOGRAPHY  10/29/2017   Procedure: Lower Extremity Angiography;  Surgeon: Adrian Prows, MD;  Location: [place] CV LAB;  Service: Cardiovascular;;  . PERIPHERAL VASCULAR CATHETERIZATION N/A 11/01/2015   Procedure: Renal Angiography;  Surgeon: Adrian Prows, MD;  Location: [place] CV LAB;  Service: Cardiovascular;  Laterality: N/A;  . PERIPHERAL VASCULAR CATHETERIZATION N/A 04/10/2016   Procedure: Renal Angiography;  Surgeon: Adrian Prows, MD;  Location: [place] CV LAB;  Service: Cardiovascular;  Laterality: N/A;  . PERIPHERAL VASCULAR CATHETERIZATION  04/10/2016   Procedure: Peripheral Vascular Intervention;  Surgeon: Adrian Prows, MD;  Location: [place] CV LAB;  Service: Cardiovascular;;  . PERIPHERAL VASCULAR INTERVENTION  10/29/2017   Procedure: PERIPHERAL VASCULAR INTERVENTION;  Surgeon: Adrian Prows, MD;  Location: [place]  CV LAB;  Service: Cardiovascular;;  . RENAL ANGIOGRAPHY N/A 10/29/2017   Procedure: RENAL ANGIOGRAPHY;  Surgeon: Adrian Prows, MD;  Location: [place] CV LAB;  Service: Cardiovascular;  Laterality: N/A;  . RIGHT OOPHORECTOMY Right 1996  . TOTAL ABDOMINAL HYSTERECTOMY  1997   Family History  Problem Relation Age of Onset  . Other Mother        many bowel obstructions  . Heart disease Mother   . Kidney cancer Father   . Bone cancer Father   . Diabetes Father   . Heart disease Father   . Diabetes Daughter   . Heart disease Brother     Social History   Tobacco Use  . Smoking status: Current Every Day Smoker    Packs/day: 0.25    Years: 40.00    Pack years: 10.00    Types: Cigarettes  . Smokeless tobacco: Never Used  Substance Use Topics  . Alcohol use: No   Marital Status: Divorced  ROS  Review of Systems  Constitution: Negative for weight gain.  Cardiovascular: Positive for chest pain and dyspnea on exertion. Negative for claudication, leg swelling and syncope.  Musculoskeletal: Negative for joint swelling.  Neurological: Positive for dizziness.  Psychiatric/Behavioral: Positive for depression.   Objective  Blood pressure (!) 150/74, pulse (!) 54, temperature (!) 96.6 F (35.9 C), temperature source Temporal, resp. rate 16, height '5\' 1"'  (1.549 m), weight 116 lb 6.4 oz (52.8 kg), SpO2 98 %.  Vitals with BMI 02/10/2020 02/10/2020 01/25/2020  Height - '5\' 1"'  5' 1.75"  Weight - 116 lbs 6 oz 112 lbs  BMI - 76.54 65.03  Systolic 546 568 127  Diastolic 74 81 66  Pulse 54 54 60     Physical Exam  Constitutional: She appears well-developed and well-nourished. No distress.  Cardiovascular: Normal rate, regular rhythm, intact distal pulses and normal pulses. Exam reveals no gallop.  Murmur heard.  Early systolic murmur is present with a grade of 2/6 at the upper right sternal border and upper left sternal border. Pulses:      Carotid pulses are on the right side with bruit and  on the left side with bruit. No leg edema. No JVD.    Pulmonary/Chest: Effort normal and breath sounds normal. No accessory muscle usage. No respiratory distress.  Prolonged expiration, barrel shaped chest   Abdominal: Soft.   Laboratory examination:   No results for input(s): NA, K, CL, CO2, GLUCOSE, BUN, CREATININE, CALCIUM, GFRNONAA, GFRAA in the last 8760 hours. CrCl cannot be calculated (Patient's most recent lab result is older than the maximum 21 days allowed.).  CMP Latest Ref Rng & Units 06/05/2017 11/09/2015 11/02/2015  Glucose 65 - 99 mg/dL 68 111(H) 153(H)  BUN 6 - 20 mg/dL 16 12 23(H)  Creatinine 0.44 - 1.00 mg/dL 1.51(H) 1.11(H) 1.58(H)  Sodium 135 - 145 mmol/L 134(L) 137 134(L)  Potassium 3.5 - 5.1 mmol/L 4.5 4.8 3.6  Chloride 101 - 111 mmol/L 99(L) 107 93(L)  CO2 22 - 32 mmol/L 27 19(L)  28  Calcium 8.9 - 10.3 mg/dL 9.7 9.9 8.8(L)  Total Protein 6.5 - 8.1 g/dL - - -  Total Bilirubin 0.3 - 1.2 mg/dL - - -  Alkaline Phos 38 - 126 U/L - - -  AST 15 - 41 U/L - - -  ALT 14 - 54 U/L - - -   CBC Latest Ref Rng & Units 06/05/2017 11/09/2015 11/02/2015  WBC 4.0 - 10.5 K/uL 7.0 7.6 10.5  Hemoglobin 12.0 - 15.0 g/dL 11.6(L) 11.3(L) 9.3(L)  Hematocrit 36.0 - 46.0 % 35.9(L) 33.9(L) 29.2(L)  Platelets 150 - 400 K/uL 402(H) 523(H) 314   Lipid Panel     Component Value Date/Time   CHOL 134 10/14/2019 1408   TRIG 81 10/14/2019 1408   HDL 54 10/14/2019 1408   CHOLHDL 2.5 10/14/2019 1408   CHOLHDL 3.7 10/08/2012 0228   VLDL 30 10/08/2012 0228   LDLCALC 64 10/14/2019 1408   HEMOGLOBIN A1C No results found for: HGBA1C, MPG TSH No results for input(s): TSH in the last 8760 hours.  External labs:   External labs 11/05/2019:  Glucose 101 (H) 65 - 99 mg/dL  BUN 13 6 - 24 mg/dL  Creatinine, Serum 0.90 0.57 - 1 mg/dL  eGFR If NonAfrican American 71 >59 mL/min/1.73  eGFR If African American 82 >59 mL/min/1.73  BUN/Creatinine Ratio 14 9 - 23   Sodium 138 134 - 144 mmol/L  Potassium 5.8  (H) 3.5 - 5.2 mmol/L  Chloride 102 96 - 106 mmol/L  CO2 22 20 - 29 mmol/L  CALCIUM 10.4 (H) 8.7 - 10.2 mg/dL  Total Protein 7.0 6 - 8.5 g/dL  Albumin, Serum 4.6 3.8 - 4.9 g/dL  Globulin, Total 2.4 1.5 - 4.5 g/dL  Albumin/Globulin Ratio 1.9 1.2 - 2.2   Total Bilirubin 0.2 0 - 1.2 mg/dL  Alkaline Phosphatase 117 39 - 117 IU/L  AST 25 0 - 40 IU/L  ALT (SGPT) 9 0 - 32 IU/L   TIBC 493 (H) 250 - 450 ug/dL [facility]   UIBC 132 131 - 425 ug/dL [facility]   Iron 361 (H) 27 - 159 ug/dL [facility]   Iron Saturation 73 (H) 15 - 55 % [facility]    WBC 6.8 4.5 - 11.0 x 10^3/uL [facility]   % LYMPH 25.9 25.0 - 40.0 % [facility]   MONO % 7.4 4.0 - 12.0 % [facility]   % GRAN 66.7 50.0 - 73.0 % [facility]   Lymphs Absolute 1.8 1.0 - 4.5 x 10^3/uL [facility]   Monos Absolute 0.5 0.1 - 1.0 x 10^3/uL [facility]   Grans Absolute 4.5 1.5 - 7.5 x 10^3/uL [facility]   RBC 3.28 (A) 4.00 - 5.20 x10E6/uL [facility]   HGB 7.1 (A) 12.0 - 15.0 g/dL [facility]   Hematocrit 24.8 (A) 35.8 - 47.9 % [facility]   MCV 75.5 (A) 82.0 - 98.0 fL [facility]   MCH 21.5 (A) 26.0 - 34.0 pg [facility]   MCHC 28.5 (A) 31.0 - 37.0 g/dL [facility]   RDW 19.3 (A) 11.7 - 16.5 % [facility]   Platelet Count 460 (A) 150 - 400 x 10^3/uL [facility]   MPV 6.8 (A)  7.1 - 10.5 fL [facility]    Cholesterol, Total 187 100 - 199 mg/dL [facility]   Triglycerides 120 0 - 149 mg/dL [facility]   HDL 47 >39 mg/dL [facility]   VLDL Cholesterol Cal 22 5 - 40 mg/dL [facility]   LDL 118 (H) 0 - 99 mg/dL [facility]   LDL/HDL Ratio 2.5           0.0 - 3.2 ratio      Medications  and allergies   Allergies  Allergen Reactions  . Doxycycline Anaphylaxis and Hives  . Hydrocodone-Acetaminophen Nausea And Vomiting  . Iohexol Itching    Pt has itching nose after iv contrast injection     Current Outpatient Medications  Medication Instructions  . acetaminophen (TYLENOL) 1,000 mg, Oral, Every 8 hours PRN  . albuterol (PROVENTIL HFA;VENTOLIN HFA) 108 (90 BASE) MCG/ACT inhaler 2 puffs, 2 times daily PRN  . ALPRAZolam (XANAX) 1 MG tablet 1 tablet, Oral, 2 times daily PRN  . atenolol (TENORMIN) 25 mg, Oral, 2 times daily  . atorvastatin (LIPITOR) 80 mg, Oral, Daily  . azelastine (ASTELIN) 0.1 % nasal spray 1 spray, Each Nare, Daily  . Calcium Carb-Cholecalciferol (CALCIUM-VITAMIN D) 600-400 MG-UNIT TABS 1 tablet, Oral, Daily  . clopidogrel (PLAVIX) 75 MG tablet TAKE 1 TABLET BY MOUTH ONCE A DAY  . ezetimibe (ZETIA) 10 MG tablet TAKE 1 TABLET BY MOUTH ONCE DAILY  . gabapentin (NEURONTIN) 400 mg, Oral, 2 times daily, Patient takes 400 mg 3 times daily and 1200 mg at bedtime  . isosorbide mononitrate (IMDUR) 60 mg, Oral, Daily  . mometasone-formoterol (DULERA) 100-5 MCG/ACT AERO Take 2 puffs first thing in am and then another 2 puffs about 12 hours later.  . Multiple Vitamin (MULTIVITAMIN WITH MINERALS) TABS tablet 1 tablet, Oral, Daily  . nitroGLYCERIN (NITROSTAT) 0.4 mg, Sublingual, Every 5 min PRN  . pantoprazole (PROTONIX) 40 mg, Oral, 2 times daily  . traZODone (DESYREL) 100 mg, Oral, Nightly  . venlafaxine XR (EFFEXOR XR) 150 mg, Oral, Daily with breakfast  . vitamin B-12 (CYANOCOBALAMIN) 500 mcg, Oral, Daily   Radiology:   No results found.  Cardiac Studies:   Renal arteriogram 02/29/2018: Widely patient stents. 04/10/2016: Scoring balloon angioplasty left in-stent restenosis with 6 x 20 mm angiosculpt balloon.  Right renal artery stent widely patent. H/O bilateral renal stenting on 09/07/2014: Right renal artery 6.0 x 15 mm Herculink widely patent, left renal  artery  6.0 x 18 mm Herculink stent.  Renal artery duplex 09/18/2017: Hemodynamically significant stenosis of the right renal artery. Peak Velocity of 308/66 cm/s. Normal intrarenal vascular perfusion is noted in both kidneys. There is increased echogenecity of both kidneys suggests medico-renal disease with right kidney slightly shrunk at 8.03x3.97x4 cm compared to 9.144.664.6 cm Diffuse plaque noted in the abdominal aorta with calcification.  Compared to the study done on 01/22/2017, left renal artery stenosis not evident, right renal artery stenosis new.  Patient has h/o bilateral renal artery stenting.  Abdominal aortogram 10/29/2017: Widely patent renal arteries. Distal abdominal aorta diffuse disease, calcification with 80% stenosis S/P 10 x 29 mm Omnilink Elite stent, 80% to 0%.  Lexiscan myoview stress test 02/17/2018:  1. Lexiscan stress test was performed. Exercise capacity was not assessed. Stress symptoms included dizziness, nausea, headache, and chest pressure.. Peak blood pressure was 176/88 mmHg. Stress EKG is non diagnostic for ischemia as it is a pharmacologic stress. In addition, it showed The stress electrocardiogram showed sinus  tachycardia, normal stress conduction, left ventricular hypertrophy, no stress arrhythmias and normal stress repolarization. 2. The overall quality of the study is excellent. Left ventricular cavity is noted to be normal on the rest and stress studies. Gated SPECT images reveal normal myocardial thickening and wall motion. The left ventricular ejection fraction was calculated or visually estimated to be 78%. SPECT images demonstrate small perfusion abnormality of moderate intensity in the mid anterolateral and apical lateral myocardial wall(s) on the stress images, reversible on rest images. Findings suggest small area of moderate intensity ischemia in mid to apical anterolateral myocardium. Intermediate risk study.  Cardiac Cath 03/25/2018: Normal coronary  arteries.  Lower extremity arterial duplex 12/05/2018: No hemodynamically significant stenoses are identified in the lower extremity arterial system.  This exam reveals moderately decreased perfusion of the right lower extremity, noted at the dorsalis pedis and post tibial artery level (ABI 0.68) and mildly decreased perfusion of the left lower extremity, noted at the post tibial artery level (ABI 0.83). Triphasic waveform the the major vessels suggest diffuse small vessel disease. Left proximal SFA has < 50% stenosis.  208/06/2016, mild progression of PAD with decreased ABI bilaterally compared to left ABI 0.95 and right ABI 0.94.  Abdominal Aortic Duplex  05/27/2019: Moderate plaque noted in the proximal, mid and distal aorta.  No abdominal aortic aneurysm. Distal abdominal aortic velocity is mildly elevated suggestive of <50% stenosis. Patient has h/o distal abdominal aortic stenosis and stenting on 10/29/2017. The stent appears patent.   Carotid artery duplex  10/09/2019: Doppler flow velocities in the right internal carotid artery are consistent with stenosis in the range of 50-69% with >= 50% heterogeneous plaque. Doppler flow velocities in the left internal carotid artery are consistent with stenosis in the range of 50-69% with >= 50% heterogeneous plaque. Antegrade right vertebral artery flow. Antegrade left vertebral artery flow. Compared to the study done on 04/17/2019, this progression of disease, right ICA stenosis is new. Follow up in six months is appropriate if clinically indicated.  Echocardiogram 10/16/2019:  Normal LV systolic function with EF 60%. Left ventricle cavity is normal in size. Normal global wall motion. Normal diastolic filling pattern.  Calculated EF 60%.  Left atrial cavity is mildly dilated in 4 chamber views.  Structurally normal mitral valve. Mild (Grade I) mitral regurgitation.  Structurally normal tricuspid valve. Mild tricuspid regurgitation. No evidence  of pulmonary hypertension.  Compared to 02/25/2017, no significant change.   EKG  EKG 02/10/2020: Sinus bradycardia at rate of 55 bpm, normal axis, poor R wave progression, probably normal variant.  No evidence of ischemia.   No significant change from 07/02/2019.  Assessment     ICD-10-CM   1. Microcytic anemia  D50.9   2. Atypical chest pain  R07.89 EKG 12-Lead  3. Dizziness  R42   4. Asymptomatic bilateral carotid artery stenosis  I65.23   5. Peripheral arterial disease (HCC)  I73.9   6. Pure hypercholesterolemia  E78.00   7. Tobacco use disorder  F17.200      No orders of the defined types were placed in this encounter.   There are no discontinued medications.  Recommendations:   She has been having frequent episodes of chest pain, exertional and I suspect this is angina related to demand ischemia. I am concerned regarding her microcytic anemia. On her last office visit 6 weeks ago Plavix was discontinued. She may need further evaluation for severe anemia and probably iron transfusion.  With regard to hypertension, systolic elevation in blood pressure  with severe anemia is possible, previously blood pressure has been well controlled. Hence I did not make any changes.  With regard to peripheral arterial disease, she has excellent bounding pulses in the lower extremity, we discussed regarding complete abstinence from tobacco, she is presently smoking only 2 cigarettes a day and having a hard time to quit that.  Carotid stenosis is only moderate, continue surveillance. Continue aspirin indefinitely.  She was started on Zetia on her last office visit due to elevated LDL, she is tolerating this. At some point I will perform repeat lipid profile testing. I would like to see her back in 6 weeks for follow-up of her symptoms of angina and hypertension as well.   Adrian Prows, MD, [facility] 02/10/2020, 2:58 PM [facility] Pager: [phone] Office: [phone]

## 2020-02-10 ENCOUNTER — Other Ambulatory Visit: Payer: Self-pay

## 2020-02-10 ENCOUNTER — Ambulatory Visit: Payer: Medicare Other | Admitting: Cardiology

## 2020-02-10 ENCOUNTER — Encounter: Payer: Self-pay | Admitting: Cardiology

## 2020-02-10 VITALS — BP 150/74 | HR 54 | Temp 96.6°F | Resp 16 | Ht 61.0 in | Wt 116.4 lb

## 2020-02-10 DIAGNOSIS — I739 Peripheral vascular disease, unspecified: Secondary | ICD-10-CM

## 2020-02-10 DIAGNOSIS — R0789 Other chest pain: Secondary | ICD-10-CM

## 2020-02-10 DIAGNOSIS — E7800 Pure hypercholesterolemia, unspecified: Secondary | ICD-10-CM

## 2020-02-10 DIAGNOSIS — D509 Iron deficiency anemia, unspecified: Secondary | ICD-10-CM

## 2020-02-10 DIAGNOSIS — R42 Dizziness and giddiness: Secondary | ICD-10-CM

## 2020-02-10 DIAGNOSIS — I209 Angina pectoris, unspecified: Secondary | ICD-10-CM

## 2020-02-10 DIAGNOSIS — I6523 Occlusion and stenosis of bilateral carotid arteries: Secondary | ICD-10-CM

## 2020-02-10 DIAGNOSIS — F17200 Nicotine dependence, unspecified, uncomplicated: Secondary | ICD-10-CM

## 2020-02-16 ENCOUNTER — Telehealth: Payer: Self-pay | Admitting: Physician Assistant

## 2020-02-16 NOTE — Telephone Encounter (Signed)
The pt has an appt with Nevin Bloodgood on 6/10 and wanted to see if there was anything sooner with Dr Ardis Hughs.  I advised her that there was nothing sooner and she could call periodically to see if there has been a cancellation. The pt has been advised of the information and verbalized understanding.

## 2020-02-16 NOTE — Telephone Encounter (Signed)
Patient called states she has a lot of rectal\Abd pain please advise

## 2020-03-04 ENCOUNTER — Other Ambulatory Visit: Payer: Self-pay | Admitting: Cardiology

## 2020-03-10 ENCOUNTER — Ambulatory Visit (INDEPENDENT_AMBULATORY_CARE_PROVIDER_SITE_OTHER): Payer: Medicare Other | Admitting: Nurse Practitioner

## 2020-03-10 ENCOUNTER — Encounter: Payer: Self-pay | Admitting: Nurse Practitioner

## 2020-03-10 VITALS — BP 168/82 | HR 57 | Ht 61.75 in | Wt 110.2 lb

## 2020-03-10 DIAGNOSIS — R935 Abnormal findings on diagnostic imaging of other abdominal regions, including retroperitoneum: Secondary | ICD-10-CM | POA: Diagnosis not present

## 2020-03-10 DIAGNOSIS — R1012 Left upper quadrant pain: Secondary | ICD-10-CM | POA: Diagnosis not present

## 2020-03-10 DIAGNOSIS — K5909 Other constipation: Secondary | ICD-10-CM | POA: Diagnosis not present

## 2020-03-10 NOTE — Patient Instructions (Addendum)
If you are age 57 or older, your body mass index should be between 23-30. Your Body mass index is 20.33 kg/m. If this is out of the aforementioned range listed, please consider follow up with your Primary Care Provider.  If you are age 27 or younger, your body mass index should be between 19-25. Your Body mass index is 20.33 kg/m. If this is out of the aformentioned range listed, please consider follow up with your Primary Care Provider.   Start Miralax 1 capful every other day and may increase to daily.   Call if no improvement ask to speak with [facility].   Follow up as needed.

## 2020-03-11 ENCOUNTER — Encounter: Payer: Self-pay | Admitting: Nurse Practitioner

## 2020-03-11 NOTE — Progress Notes (Signed)
IMPRESSION and PLAN:    59 year old female with PMH significant for adenomatous colon polyps, chronic abdominal pain, perforated diverticulitis in 2008 status post partial colectomy , schizophrenia, COPD  LUQ / Left mid abdominal pain. Resolved . --Pain was progressive over 2 weeks prior to seeing PCP on 03/01/2020. Patient was worried about a bowel obstruction from scar tissue related to remote partial colectomy. No acute findings on CT scan with contrast that day. Gastric fold thickening, likely sequelae of chronic gastritis. Mild fecal retention on CT scan.  --Pain resolved after getting results from two mineral oil enema.  --No NSAID use and on PPI.  --At this point will hold off on further evaluation such as EGD. Perhaps her pain was secondary to constipation. Dr. Ardis Collins can review CT scan. If pain recurs or if he has concerns about gastric findings then consider proceeding with an EGD. This could be done at time of next polyp surveillance colonoscopy in August (but we could get the studies done sooner if need be).  # Chronic constipation with scybalous stool --Recommend 64 ounces of water daily --Dose of MiraLAX every other day, can titrate to once daily if needed  # AKI.  --Developed within last few weeks. PCP following.   # Mild normocytic anemia.  --Recent hgb 10.9, baseline around 11.3 --No overt GI bleeding.  --After patient let office I spent time reviewing some of her older labs. Something odd with her previous abs in Care Everywhere.  On 11/11/19 her hgb was 7.1, MCV low at 75.  TIBC 493, iron 361, iron saturation 73% suggesting iron overload.  Then, on 02/15/20 her hgb was 10.9, iron sat was only 13%.  -Recommend getting a repeat CBC / iron studies at next PCP follow up.    HPI:    Primary GI: Cheyenne Caprice, MD       Chief complaint : concerned about a bowel blockage   [name] is a 59 year old female known to Dr. Ardis Collins, last seen in 2018.   She had a  colonoscopy January 2018 for evaluation of a positive Cologuard.  She had a colonoscopy by Dr. Ardis Collins January 2018 for evaluation of a positive Cologuard.  A couple of adenomatous polyps were removed.  One of the polyps was removed in a piecemeal fashion, she was scheduled for follow-up flexible sigmoidoscopy August 2018.  A 6 mm polyp was removed from the rectum, it was hyperplastic.  Patient due for recall colonoscopy in August of this year.   [name] saw PCP last week for evaluation of LUQ pain.  Records reviewed in care everywhere . LFTs and lipase were normal.  Cr was high as 2.59, it was 1.3 two weeks earlier but normal in February 2021. . WBC normal. Hgb 10.9. MCV 89.  Non -contrast CT scan on 03/01/20 negative for any acute findings explaining the pain.  There was gastric fold thickening, small hiatal hernia, mild fecal retention.     Previous Endoscopic Evaluations: Two sessile polyps were found in the descending colon and transverse colon. The polyps were 3 to 4 mm in size. These polyps were removed with a cold snare. Resection and retrieval were complete. Findings: A 10 mm polyp was found in the distal sigmoid colon. The polyp was sessile. The polyp was removed with a hot snare, piecemeal fashion. Resection and retrieval were complete. Two sessile polyps were found in the recto-sigmoid colon. The polyps were 4 to 5 mm in size. These polyps  were removed with a cold snare. Resection and retrieval were complete. The exam was otherwise without abnormality on direct and retroflexion views.  Colon, polyp(s), transverse, descending - TUBULAR ADENOMA (2). NO HIGH GRADE DYSPLASIA OR MALIGNANCY IDENTIFIED. 2. Colon, polyp(s), distal sigmoid - TUBULAR ADENOMA AND HYPERPLASTIC POLYP. NO HIGH GRADE DYSPLASIA OR MALIGNANCY IDENTIFIED. 3. Colon, polyp(s), rectosigmoid polyps - HYPERPLASTIC POLYP (2). NO ADENOMATOUS CHANGE OR MALIGNANCY   Review of systems:     No chest pain, no SOB, no fevers, no  urinary sx   Past Medical History:  Diagnosis Date  . Anemia 2008  . Anxiety   . Aortic stenosis   . Arthritis    "all my joints ache" (09/07/2014)  . Asthma   . Bipolar 1 disorder ([name])   . Bradycardia   . Bruit   . Carpal tunnel syndrome   . Cervical cancer ([name]) 1985  . CHF (congestive heart failure) ([name])   . Chronic kidney disease (CKD), stage V ([name])   . COPD (chronic obstructive pulmonary disease) ([name]) 2000  . Coronary artery disease   . Daily headache   . Depression 2000  . Diverticulitis 2008  . GERD (gastroesophageal reflux disease)   . Glaucoma   . Heart murmur   . History of colon polyps 2009  . HLD (hyperlipidemia) 2013  . Hypertension 2013  . Hypovitaminosis D   . IBS (irritable bowel syndrome) 2008  . Myocardial infarction ([name])    2015  . PAD (peripheral artery disease) ([name])   . Pancreatitis 10/2011  . Pneumonia 07/2014  . RLS (restless legs syndrome)   . Schizophrenia ([name])   . Shortness of breath   . Skin cancer   . Small bowel obstruction ([name]) 2008    Patient's surgical history, family medical history, social history, medications and allergies were all reviewed in Epic   Creatinine clearance cannot be calculated (Patient's most recent lab result is older than the maximum 21 days allowed.)  Current Outpatient Medications  Medication Sig Dispense Refill  . acetaminophen (TYLENOL) 500 MG tablet Take 1,000 mg by mouth every 8 (eight) hours as needed for mild pain or headache.    . albuterol (PROVENTIL HFA;VENTOLIN HFA) 108 (90 BASE) MCG/ACT inhaler Inhale 2 puffs into the lungs 2 (two) times daily as needed (asthma).     . ALPRAZolam (XANAX) 1 MG tablet Take 1 tablet by mouth 2 (two) times daily as needed for anxiety.     Marland Kitchen atenolol (TENORMIN) 25 MG tablet Take 1 tablet (25 mg total) by mouth 2 (two) times daily. 180 tablet 1  . atorvastatin (LIPITOR) 80 MG tablet Take 80 mg by mouth daily.     Marland Kitchen azelastine (ASTELIN) 0.1 % nasal spray Place 1 spray  into both nostrils daily at 2 PM.    . Calcium Carb-Cholecalciferol (CALCIUM-VITAMIN D) 600-400 MG-UNIT TABS Take 1 tablet by mouth daily.    Marland Kitchen ezetimibe (ZETIA) 10 MG tablet TAKE 1 TABLET BY MOUTH ONCE DAILY 30 tablet 1  . gabapentin (NEURONTIN) 400 MG capsule Take 1 capsule (400 mg total) by mouth 2 (two) times daily. Patient takes 400 mg 3 times daily and 1200 mg at bedtime (Patient taking differently: Take 400 mg by mouth at bedtime. )    . mometasone-formoterol (DULERA) 100-5 MCG/ACT AERO Take 2 puffs first thing in am and then another 2 puffs about 12 hours later. 1 Inhaler 11  . Multiple Vitamin (MULTIVITAMIN WITH MINERALS) TABS tablet Take 1 tablet by mouth daily.    Marland Kitchen  nitroGLYCERIN (NITROSTAT) 0.4 MG SL tablet Place 1 tablet (0.4 mg total) under the tongue every 5 (five) minutes as needed for chest pain. 30 tablet 1  . pantoprazole (PROTONIX) 40 MG tablet TAKE 1 TABLET BY MOUTH TWICE (2) DAILY 60 tablet 2  . traZODone (DESYREL) 100 MG tablet Take 100 mg by mouth as needed.     . venlafaxine XR (EFFEXOR XR) 150 MG 24 hr capsule Take 1 capsule (150 mg total) by mouth daily with breakfast. (Patient taking differently: Take 300 mg by mouth daily with breakfast. ) 30 capsule 0  . vitamin B-12 (CYANOCOBALAMIN) 500 MCG tablet Take 500 mcg by mouth daily.    . isosorbide mononitrate (IMDUR) 60 MG 24 hr tablet Take 1 tablet (60 mg total) by mouth daily. 90 tablet 3   No current facility-administered medications for this visit.    Physical Exam:     BP (!) 168/82   Pulse (!) 57   Ht 5' 1.75" (1.568 m)   Wt 110 lb 4 oz (50 kg)   BMI 20.33 kg/m   GENERAL:  Pleasant female in NAD PSYCH: : Cooperative, normal affect CARDIAC:  RRR PULM: Normal respiratory effort, lungs CTA bilaterally, no wheezing ABDOMEN:  Nondistended, soft, nontender. No obvious masses, no hepatomegaly,  normal bowel sounds SKIN:  turgor, no lesions seen Musculoskeletal:  Normal muscle tone, normal strength NEURO: Alert  and oriented x 3, no focal neurologic deficits   Tye Savoy , NP 03/11/2020, 12:40 PM

## 2020-03-14 NOTE — Progress Notes (Signed)
Outside CT report viewed, I could not access images.  I agree with the above note, plan

## 2020-03-24 ENCOUNTER — Ambulatory Visit: Payer: Medicare Other | Admitting: Cardiology

## 2020-03-24 ENCOUNTER — Other Ambulatory Visit: Payer: Self-pay

## 2020-03-24 ENCOUNTER — Encounter: Payer: Self-pay | Admitting: Cardiology

## 2020-03-24 VITALS — BP 160/96 | HR 59 | Ht 61.75 in | Wt 110.6 lb

## 2020-03-24 DIAGNOSIS — N184 Chronic kidney disease, stage 4 (severe): Secondary | ICD-10-CM

## 2020-03-24 DIAGNOSIS — I6523 Occlusion and stenosis of bilateral carotid arteries: Secondary | ICD-10-CM

## 2020-03-24 DIAGNOSIS — E7800 Pure hypercholesterolemia, unspecified: Secondary | ICD-10-CM

## 2020-03-24 DIAGNOSIS — I10 Essential (primary) hypertension: Secondary | ICD-10-CM

## 2020-03-24 DIAGNOSIS — I209 Angina pectoris, unspecified: Secondary | ICD-10-CM

## 2020-03-24 DIAGNOSIS — I1A0 Resistant hypertension: Secondary | ICD-10-CM

## 2020-03-24 MED ORDER — EZETIMIBE 10 MG PO TABS: 10.0000 mg | ORAL_TABLET | Freq: Every day | ORAL | 3 refills | Status: DC

## 2020-03-24 MED ORDER — HYDRALAZINE HCL 25 MG PO TABS: 25.0000 mg | ORAL_TABLET | Freq: Three times a day (TID) | ORAL | 2 refills | Status: DC

## 2020-03-24 MED ORDER — ISOSORBIDE DINITRATE 30 MG PO TABS: 30.0000 mg | ORAL_TABLET | Freq: Three times a day (TID) | ORAL | 2 refills | Status: DC

## 2020-03-24 NOTE — Progress Notes (Signed)
Primary Physician/Referring:  Bernerd Limbo, MD  [name], female    DOB: 1960/10/24, 59 y.o.   MRN: [id number]  Chief Complaint  Patient presents with  . Chest Pain  . Hypertension  . Follow-up    c/o headache and chest pain,fatigue,sob,very depressed   HPI:    [name]  is a 59 y.o. Caucasian female  with hypertension, COPD, diastolic CHF, ongoing tobacco abuse (2 cig a day), and difficult to control hypertension with CKD, with bilateral renal artery angioplasty, PAD with distal abdominal aorta stenosis S/P stenting with 10x 29 mm Omnilink Elite stent. She underwent coronary angiogram in June 2019 for nitroglycerin responsive chest pain that revealed normal coronary arteries.   I have seen her 6 weeks ago, she was having frequent episodes of angina and also was using nitroglycerin fairly frequently.  She had also reduce smoking.  However on my evaluation she was found to be severely anemic and she was placed on oral iron supplements by her PCP.  She has noticed improvement in symptoms of angina and she has not used nitroglycerin anymore.  Dyspnea has remained stable.  No PND or orthopnea.  She still continues to have fatigue.  Still having difficulty with smoking cessation.  Past Medical History:  Diagnosis Date  . Anemia 2008  . Anxiety   . Aortic stenosis   . Arthritis    "all my joints ache" (09/07/2014)  . Asthma   . Bipolar 1 disorder ([place])   . Bradycardia   . Bruit   . Carpal tunnel syndrome   . Cervical cancer ([place]) 1985  . CHF (congestive heart failure) ([place])   . Chronic kidney disease (CKD), stage V ([place])   . COPD (chronic obstructive pulmonary disease) ([place]) 2000  . Coronary artery disease   . Daily headache   . Depression 2000  . Diverticulitis 2008  . GERD (gastroesophageal reflux disease)   . Glaucoma   . Heart murmur   . History of colon polyps 2009  . HLD (hyperlipidemia) 2013  . Hypertension 2013  .  Hypovitaminosis D   . IBS (irritable bowel syndrome) 2008  . Myocardial infarction ([place])    2015  . PAD (peripheral artery disease) ([place])   . Pancreatitis 10/2011  . Pneumonia 07/2014  . RLS (restless legs syndrome)   . Schizophrenia ([place])   . Shortness of breath   . Skin cancer   . Small bowel obstruction ([place]) 2008   Past Surgical History:  Procedure Laterality Date  . ABDOMINAL ANGIOGRAM N/A 09/07/2014   Procedure: ABDOMINAL ANGIOGRAM;  Surgeon: Laverda Page, MD;  Location: [facility] CATH LAB;  Service: Cardiovascular;  Laterality: N/A;  . APPENDECTOMY  1996  . COLON SURGERY  2008   6 inches of colon removed due to obstruction  . COLONOSCOPY WITH PROPOFOL N/A 10/25/2016   Procedure: COLONOSCOPY WITH PROPOFOL;  Surgeon: Milus Banister, MD;  Location: [facility] ENDOSCOPY;  Service: Endoscopy;  Laterality: N/A;  . CORONARY ANGIOPLASTY WITH STENT PLACEMENT  09/07/2014   "2"  . LEFT HEART CATH AND CORONARY ANGIOGRAPHY N/A 03/25/2018   Procedure: LEFT HEART CATH AND CORONARY ANGIOGRAPHY;  Surgeon: Nigel Mormon, MD;  Location: [place] CV LAB;  Service: Cardiovascular;  Laterality: N/A;  . LEFT HEART CATHETERIZATION WITH CORONARY ANGIOGRAM N/A 09/07/2014   Procedure: LEFT HEART CATHETERIZATION WITH CORONARY ANGIOGRAM;  Surgeon: Laverda Page, MD;  Location: [facility] CATH LAB;  Service: Cardiovascular;  Laterality: N/A;  . LOWER EXTREMITY  ANGIOGRAPHY  10/29/2017   Procedure: Lower Extremity Angiography;  Surgeon: Adrian Prows, MD;  Location: [place] CV LAB;  Service: Cardiovascular;;  . PERIPHERAL VASCULAR CATHETERIZATION N/A 11/01/2015   Procedure: Renal Angiography;  Surgeon: Adrian Prows, MD;  Location: [place] CV LAB;  Service: Cardiovascular;  Laterality: N/A;  . PERIPHERAL VASCULAR CATHETERIZATION N/A 04/10/2016   Procedure: Renal Angiography;  Surgeon: Adrian Prows, MD;  Location: [place] CV LAB;  Service: Cardiovascular;  Laterality: N/A;  . PERIPHERAL VASCULAR CATHETERIZATION   04/10/2016   Procedure: Peripheral Vascular Intervention;  Surgeon: Adrian Prows, MD;  Location: [place] CV LAB;  Service: Cardiovascular;;  . PERIPHERAL VASCULAR INTERVENTION  10/29/2017   Procedure: PERIPHERAL VASCULAR INTERVENTION;  Surgeon: Adrian Prows, MD;  Location: [place] CV LAB;  Service: Cardiovascular;;  . RENAL ANGIOGRAPHY N/A 10/29/2017   Procedure: RENAL ANGIOGRAPHY;  Surgeon: Adrian Prows, MD;  Location: [place] CV LAB;  Service: Cardiovascular;  Laterality: N/A;  . RIGHT OOPHORECTOMY Right 1996  . TOTAL ABDOMINAL HYSTERECTOMY  1997   Family History  Problem Relation Age of Onset  . Other Mother        many bowel obstructions  . Heart disease Mother   . Colon polyps Mother   . Kidney cancer Father   . Bone cancer Father   . Diabetes Father   . Heart disease Father   . Diabetes Daughter   . Colon cancer Paternal Grandfather   . Heart disease Brother   . Esophageal cancer Neg Hx     Social History   Tobacco Use  . Smoking status: Current Every Day Smoker    Packs/day: 0.25    Years: 40.00    Pack years: 10.00    Types: Cigarettes  . Smokeless tobacco: Never Used  Substance Use Topics  . Alcohol use: No   Marital Status: Divorced  ROS  Review of Systems  Constitutional: Negative for weight gain.  Cardiovascular: Positive for chest pain and dyspnea on exertion. Negative for claudication, leg swelling and syncope.  Musculoskeletal: Negative for joint swelling.  Neurological: Positive for dizziness.  Psychiatric/Behavioral: Positive for depression.   Objective  Blood pressure (!) 160/96, pulse (!) 59, height 5' 1.75" (1.568 m), weight 110 lb 9.6 oz (50.2 kg), SpO2 97 %.  Vitals with BMI 03/24/2020 03/24/2020 03/10/2020  Height - 5' 1.75" 5' 1.75"  Weight - 110 lbs 10 oz 110 lbs 4 oz  BMI - 63.1 49.70  Systolic 263 785 885  Diastolic 96 97 82  Pulse - 59 57     Physical Exam Constitutional:      General: She is not in acute distress.    Appearance:  She is well-developed.  Cardiovascular:     Rate and Rhythm: Normal rate and regular rhythm.     Pulses: Normal pulses and intact distal pulses.          Carotid pulses are on the right side with bruit and on the left side with bruit.    Heart sounds: Murmur heard.  Early systolic murmur is present with a grade of 2/6 at the upper right sternal border and upper left sternal border.  No gallop.      Comments: No leg edema. No JVD.   Pulmonary:     Effort: Pulmonary effort is normal. No accessory muscle usage or respiratory distress.     Breath sounds: Normal breath sounds.  Abdominal:     Palpations: Abdomen is soft.    Laboratory examination:  Lipid  Panel     Component Value Date/Time   CHOL 134 10/14/2019 1408   TRIG 81 10/14/2019 1408   HDL 54 10/14/2019 1408   CHOLHDL 2.5 10/14/2019 1408   CHOLHDL 3.7 10/08/2012 0228   VLDL 30 10/08/2012 0228   LDLCALC 64 10/14/2019 1408   External labs:   Lab 03/01/2020:  Hb 10.9/HCT 34.4, normal indicis, platelet counts 376.  Serum glucose 100 mg, BUN 21, creatinine 2.59, EGFR 20 mL.  Sodium 140, potassium 5.9.  LFTs normal.  11/05/2019:   Glucose 101 (H) 65 - 99 mg/dL  BUN 13 6 - 24 mg/dL  Creatinine, Serum 0.90 0.57 - 1 mg/dL  eGFR If NonAfrican American 71 >59 mL/min/1.73  eGFR If African American 82 >59 mL/min/1.73  BUN/Creatinine Ratio 14 9 - 23   Sodium 138 134 - 144 mmol/L  Potassium 5.8 (H) 3.5 - 5.2 mmol/L  Chloride 102 96 - 106 mmol/L  CO2 22 20 - 29 mmol/L  CALCIUM 10.4 (H) 8.7 - 10.2 mg/dL  Total Protein 7.0 6 - 8.5 g/dL  Albumin, Serum 4.6 3.8 - 4.9 g/dL  Globulin, Total 2.4 1.5 - 4.5 g/dL  Albumin/Globulin Ratio 1.9 1.2 - 2.2   Total Bilirubin 0.2 0 - 1.2 mg/dL  Alkaline Phosphatase 117 39 - 117 IU/L  AST 25 0 - 40 IU/L  ALT (SGPT) 9 0 - 32 IU/L   TIBC 493 (H) 250 - 450 ug/dL [facility]   UIBC 132 131 - 425 ug/dL [facility]   Iron 361 (H) 27 - 159 ug/dL [facility]   Iron Saturation 73 (H) 15 - 55 %  [facility] 1    Hb 7.1/HCT 24.8, microcytic indicis.  Platelets 160. Cholesterol, Total 187 100 - 199 mg/dL [facility]   Triglycerides 120 0 - 149 mg/dL [facility]   HDL 47 >39 mg/dL [facility]   VLDL Cholesterol Cal 22 5 - 40 mg/dL [facility]   LDL 118 (H) 0 - 99 mg/dL [facility]   LDL/HDL Ratio 2.5           0.0 - 3.2 ratio      Medications and allergies   Allergies  Allergen Reactions  . Doxycycline Anaphylaxis and Hives  . Hydrocodone-Acetaminophen Nausea And Vomiting  . Iohexol Itching    Pt has itching nose after iv contrast injection     Current Outpatient Medications  Medication Instructions  . acetaminophen (TYLENOL) 1,000 mg, Oral, Every 8 hours PRN  . albuterol (PROVENTIL HFA;VENTOLIN HFA) 108 (90 BASE) MCG/ACT inhaler 2 puffs, 2 times daily PRN  . ALPRAZolam (XANAX) 1 MG tablet 1 tablet, Oral, 2 times daily PRN  . atenolol (TENORMIN) 25 mg, Oral, 2 times daily  . atorvastatin (LIPITOR) 80 mg, Oral, Daily  . azelastine (ASTELIN) 0.1 % nasal spray 1 spray, Each Nare, Daily  . ezetimibe (ZETIA) 10 mg, Oral, Daily  . gabapentin (NEURONTIN) 400 mg, Oral, 2 times daily, Patient takes 400 mg 3 times daily and 1200 mg at bedtime  . hydrALAZINE (APRESOLINE) 25 mg, Oral, 3 times daily  . isosorbide dinitrate (ISORDIL) 30 mg, Oral, 3 times daily  . mometasone-formoterol (DULERA) 100-5 MCG/ACT AERO Take 2 puffs first thing in am and then another 2 puffs about 12 hours later.  . nitroGLYCERIN (NITROSTAT) 0.4 mg, Sublingual, Every 5 min PRN  . pantoprazole (PROTONIX) 40 MG tablet TAKE 1 TABLET BY MOUTH TWICE (2) DAILY  . traZODone (DESYREL) 100 mg, Oral, As needed  . venlafaxine XR [facility]  XR) 150 mg, Oral, Daily with breakfast   Radiology:   No results found.  Cardiac Studies:   Renal arteriogram 02/29/2018: Widely patient stents. 04/10/2016: Scoring balloon angioplasty left in-stent restenosis with 6 x 20 mm angiosculpt balloon.  Right renal artery stent widely  patent. H/O bilateral renal stenting on 09/07/2014: Right renal artery 6.0 x 15 mm Herculink widely patent, left renal artery  6.0 x 18 mm Herculink stent.  Renal artery duplex 09/18/2017: Hemodynamically significant stenosis of the right renal artery. Peak Velocity of 308/66 cm/s. Normal intrarenal vascular perfusion is noted in both kidneys. There is increased echogenecity of both kidneys suggests medico-renal disease with right kidney slightly shrunk at 8.03x3.97x4 cm compared to 9.144.664.6 cm Diffuse plaque noted in the abdominal aorta with calcification.  Compared to the study done on 01/22/2017, left renal artery stenosis not evident, right renal artery stenosis new.  Patient has h/o bilateral renal artery stenting.  Abdominal aortogram 10/29/2017: Widely patent renal arteries. Distal abdominal aorta diffuse disease, calcification with 80% stenosis S/P 10 x 29 mm Omnilink Elite stent, 80% to 0%.  Lexiscan myoview stress test 02/17/2018:  1. Lexiscan stress test was performed. Exercise capacity was not assessed. Stress symptoms included dizziness, nausea, headache, and chest pressure.. Peak blood pressure was 176/88 mmHg. Stress EKG is non diagnostic for ischemia as it is a pharmacologic stress. In addition, it showed The stress electrocardiogram showed sinus tachycardia, normal stress conduction, left ventricular hypertrophy, no stress arrhythmias and normal stress repolarization. 2. The overall quality of the study is excellent. Left ventricular cavity is noted to be normal on the rest and stress studies. Gated SPECT images reveal normal myocardial thickening and wall motion. The left ventricular ejection fraction was calculated or visually estimated to be 78%. SPECT images demonstrate small perfusion abnormality of moderate intensity in the mid anterolateral and apical lateral myocardial wall(s) on the stress images, reversible on rest images. Findings suggest small area of moderate intensity  ischemia in mid to apical anterolateral myocardium. Intermediate risk study.  Cardiac Cath 03/25/2018: Normal coronary arteries.  Lower extremity arterial duplex 12/05/2018: No hemodynamically significant stenoses are identified in the lower extremity arterial system.  This exam reveals moderately decreased perfusion of the right lower extremity, noted at the dorsalis pedis and post tibial artery level (ABI 0.68) and mildly decreased perfusion of the left lower extremity, noted at the post tibial artery level (ABI 0.83). Triphasic waveform the the major vessels suggest diffuse small vessel disease. Left proximal SFA has < 50% stenosis.  208/06/2016, mild progression of PAD with decreased ABI bilaterally compared to left ABI 0.95 and right ABI 0.94.  Abdominal Aortic Duplex  05/27/2019: Moderate plaque noted in the proximal, mid and distal aorta.  No abdominal aortic aneurysm. Distal abdominal aortic velocity is mildly elevated suggestive of <50% stenosis. Patient has h/o distal abdominal aortic stenosis and stenting on 10/29/2017. The stent appears patent.   Carotid artery duplex  10/09/2019: Doppler flow velocities in the right internal carotid artery are consistent with stenosis in the range of 50-69% with >= 50% heterogeneous plaque. Doppler flow velocities in the left internal carotid artery are consistent with stenosis in the range of 50-69% with >= 50% heterogeneous plaque. Antegrade right vertebral artery flow. Antegrade left vertebral artery flow. Compared to the study done on 04/17/2019, this progression of disease, right ICA stenosis is new. Follow up in six months is appropriate if clinically indicated.  Echocardiogram 10/16/2019:  Normal LV systolic function with EF 60%. Left ventricle cavity  is normal in size. Normal global wall motion. Normal diastolic filling pattern.  Calculated EF 60%.  Left atrial cavity is mildly dilated in 4 chamber views.  Structurally normal mitral valve.  Mild (Grade I) mitral regurgitation.  Structurally normal tricuspid valve. Mild tricuspid regurgitation. No evidence of pulmonary hypertension.  Compared to 02/25/2017, no significant change.   EKG  EKG 03/24/2020: Normal sinus rhythm with rate of 58 bpm, normal axis, no evidence of ischemia.  Borderline criteria for LVH with R in V5 26 mm.   No significant change from EKG 02/10/2020.  Assessment     ICD-10-CM   1. Angina pectoris (HCC)  I20.9 EKG 12-Lead    isosorbide dinitrate (ISORDIL) 30 MG tablet    CBC  2. Resistant hypertension  I10 isosorbide dinitrate (ISORDIL) 30 MG tablet    hydrALAZINE (APRESOLINE) 25 MG tablet    CBC    Basic metabolic panel  3. Asymptomatic bilateral carotid artery stenosis  I65.23   4. Pure hypercholesterolemia  E78.00 Lipid Panel With LDL/HDL Ratio  5. CKD (chronic kidney disease) stage 4, GFR 15-29 ml/min (HCC)  N18.4      Meds ordered this encounter  Medications  . ezetimibe (ZETIA) 10 MG tablet    Sig: Take 1 tablet (10 mg total) by mouth daily.    Dispense:  90 tablet    Refill:  3  . isosorbide dinitrate (ISORDIL) 30 MG tablet    Sig: Take 1 tablet (30 mg total) by mouth 3 (three) times daily.    Dispense:  90 tablet    Refill:  2  . hydrALAZINE (APRESOLINE) 25 MG tablet    Sig: Take 1 tablet (25 mg total) by mouth 3 (three) times daily.    Dispense:  90 tablet    Refill:  2    Medications Discontinued During This Encounter  Medication Reason  . Calcium Carb-Cholecalciferol (CALCIUM-VITAMIN D) 600-400 MG-UNIT TABS Discontinued by provider  . vitamin B-12 (CYANOCOBALAMIN) 500 MCG tablet Discontinued by provider  . Multiple Vitamin (MULTIVITAMIN WITH MINERALS) TABS tablet Discontinued by provider  . ezetimibe (ZETIA) 10 MG tablet Reorder  . isosorbide mononitrate (IMDUR) 60 MG 24 hr tablet Change in therapy    Recommendations:   [name]  is a 59 y.o. Caucasian female  with hypertension, COPD, diastolic CHF, ongoing  tobacco abuse (2 cig a day), and difficult to control hypertension with CKD, with bilateral renal artery angioplasty, PAD with distal abdominal aorta stenosis S/P stenting with 10x 29 mm Omnilink Elite stent. She underwent coronary angiogram in June 2019 for nitroglycerin responsive chest pain that revealed normal coronary arteries.   On her last office visit she was having frequent episodes of angina which I suspect is related to severe iron deficiency anemia.  I reviewed her external labs, hemoglobin is improved.  However she is having severe constipation since being on oral iron, she has discontinued this.  I will let her PCP know.  It may be worthwhile to consider iron transfusion.  On review of the external labs, I also noticed that she has now developed stage IV chronic kidney disease, this is new to her.  I would like to repeat another BMP to confirm worsening renal function and if indeed her BMP confirms this, she needs nephrology consultation as well.  With regards resident hypertension, I will discontinue isosorbide mononitrate and switch her to isosorbide dinitrate 25 mg 3 times daily along with hydralazine 25 mg p.o. 3 times daily, she can always take  an extra dose if systolic blood pressure is >150 mmHg.  I like to see him back in 6 weeks for follow-up.  Concerned about carotid disease, I have reassured her.  She will need continued surveillance of carotid stenosis.  She is now tolerating Zetia, I will recheck lipids.  Smoking cessation was discussed.  Patient has enrolled herself in "quit now" program and is trying her best.  Adrian Prows, MD, [facility] 03/24/2020, 2:36 PM [facility] [place] Pager: [phone] Office: [phone]

## 2020-04-01 LAB — CBC
Hematocrit: 35.1 % (ref 34.0–46.6)
Hemoglobin: 10.8 g/dL — ABNORMAL LOW (ref 11.1–15.9)
MCH: 27.3 pg (ref 26.6–33.0)
MCHC: 30.8 g/dL — ABNORMAL LOW (ref 31.5–35.7)
MCV: 89 fL (ref 79–97)
Platelets: 405 10*3/uL (ref 150–450)
RBC: 3.96 x10E6/uL (ref 3.77–5.28)
RDW: 18 % — ABNORMAL HIGH (ref 11.7–15.4)
WBC: 6.9 10*3/uL (ref 3.4–10.8)

## 2020-04-01 LAB — LIPID PANEL WITH LDL/HDL RATIO
Cholesterol, Total: 119 mg/dL (ref 100–199)
HDL: 50 mg/dL (ref >39)
LDL Chol Calc (NIH): 51 mg/dL (ref 0–99)
LDL/HDL Ratio: 1 ratio (ref 0.0–3.2)
Triglycerides: 92 mg/dL (ref 0–149)
VLDL Cholesterol Cal: 18 mg/dL (ref 5–40)

## 2020-04-01 LAB — BASIC METABOLIC PANEL
BUN/Creatinine Ratio: 9 (ref 9–23)
BUN: 18 mg/dL (ref 6–24)
CO2: 23 mmol/L (ref 20–29)
Calcium: 9.4 mg/dL (ref 8.7–10.2)
Chloride: 100 mmol/L (ref 96–106)
Creatinine, Ser: 2.08 mg/dL — ABNORMAL HIGH (ref 0.57–1.00)
GFR calc Af Amer: 30 mL/min/{1.73_m2} — ABNORMAL LOW (ref >59)
GFR calc non Af Amer: 26 mL/min/{1.73_m2} — ABNORMAL LOW (ref >59)
Glucose: 76 mg/dL (ref 65–99)
Potassium: 5.1 mmol/L (ref 3.5–5.2)
Sodium: 137 mmol/L (ref 134–144)

## 2020-04-01 NOTE — Progress Notes (Signed)
Hemoglobin is dropping again.  Renal function has deteriorated significantly compared to previous but stable from PCP labs.  Will probably need nephrology consultation.  Will discuss on office visit.

## 2020-04-08 ENCOUNTER — Other Ambulatory Visit: Payer: Self-pay

## 2020-04-08 ENCOUNTER — Ambulatory Visit: Payer: Medicare Other

## 2020-04-08 DIAGNOSIS — I6523 Occlusion and stenosis of bilateral carotid arteries: Secondary | ICD-10-CM

## 2020-04-10 ENCOUNTER — Other Ambulatory Visit: Payer: Self-pay | Admitting: Cardiology

## 2020-04-10 DIAGNOSIS — I6523 Occlusion and stenosis of bilateral carotid arteries: Secondary | ICD-10-CM

## 2020-04-13 ENCOUNTER — Telehealth: Payer: Self-pay

## 2020-04-13 NOTE — Telephone Encounter (Signed)
Amlodipine 10 mg daily and she needs renal angiogram. She is aware of the procedure and if agrees, will set it up or she can come in for OV first

## 2020-04-13 NOTE — Telephone Encounter (Signed)
The patient called and said that her BP is still consistently high. She said it has been 203/106, she feels the meds are not helping

## 2020-04-14 ENCOUNTER — Other Ambulatory Visit: Payer: Self-pay

## 2020-04-14 MED ORDER — AMLODIPINE BESYLATE 10 MG PO TABS: 10.0000 mg | ORAL_TABLET | Freq: Every day | ORAL | 1 refills | Status: DC

## 2020-04-15 NOTE — Telephone Encounter (Signed)
LMOM for pt to call office to sch procedure

## 2020-04-18 NOTE — Telephone Encounter (Signed)
[place] for 05/10/2020

## 2020-05-04 ENCOUNTER — Other Ambulatory Visit: Payer: Self-pay

## 2020-05-04 DIAGNOSIS — I6523 Occlusion and stenosis of bilateral carotid arteries: Secondary | ICD-10-CM

## 2020-05-05 ENCOUNTER — Ambulatory Visit: Payer: Medicare Other | Admitting: Cardiology

## 2020-05-05 LAB — BMP8+EGFR
BUN/Creatinine Ratio: 9 (ref 9–23)
BUN: 22 mg/dL (ref 6–24)
CO2: 23 mmol/L (ref 20–29)
Calcium: 9.7 mg/dL (ref 8.7–10.2)
Chloride: 99 mmol/L (ref 96–106)
Creatinine, Ser: 2.42 mg/dL — ABNORMAL HIGH (ref 0.57–1.00)
GFR calc Af Amer: 25 mL/min/{1.73_m2} — ABNORMAL LOW (ref >59)
GFR calc non Af Amer: 22 mL/min/{1.73_m2} — ABNORMAL LOW (ref >59)
Glucose: 80 mg/dL (ref 65–99)
Potassium: 5.3 mmol/L — ABNORMAL HIGH (ref 3.5–5.2)
Sodium: 138 mmol/L (ref 134–144)

## 2020-05-06 ENCOUNTER — Other Ambulatory Visit (HOSPITAL_COMMUNITY)
Admission: RE | Admit: 2020-05-06 | Discharge: 2020-05-06 | Disposition: A | Discharge status: Home / Self Care | Payer: Medicare Other | Source: Ambulatory Visit | Attending: Cardiology | Admitting: Cardiology

## 2020-05-06 DIAGNOSIS — Z20822 Contact with and (suspected) exposure to covid-19: Secondary | ICD-10-CM | POA: Diagnosis not present

## 2020-05-06 DIAGNOSIS — Z01812 Encounter for preprocedural laboratory examination: Secondary | ICD-10-CM | POA: Insufficient documentation

## 2020-05-06 LAB — SARS CORONAVIRUS 2 (TAT 6-24 HRS): SARS Coronavirus 2: NEGATIVE

## 2020-05-10 ENCOUNTER — Observation Stay (HOSPITAL_COMMUNITY)
Admission: RE | Admit: 2020-05-10 | Discharge: 2020-05-11 | Disposition: A | Discharge status: Home / Self Care | Payer: Medicare Other | Attending: Cardiology | Admitting: Cardiology

## 2020-05-10 ENCOUNTER — Other Ambulatory Visit: Payer: Self-pay

## 2020-05-10 ENCOUNTER — Encounter (HOSPITAL_COMMUNITY)
Admission: RE | Disposition: A | Discharge status: Home / Self Care | Payer: Self-pay | Source: Home / Self Care | Attending: Cardiology

## 2020-05-10 DIAGNOSIS — Z8541 Personal history of malignant neoplasm of cervix uteri: Secondary | ICD-10-CM | POA: Diagnosis not present

## 2020-05-10 DIAGNOSIS — Z885 Allergy status to narcotic agent status: Secondary | ICD-10-CM | POA: Insufficient documentation

## 2020-05-10 DIAGNOSIS — N186 End stage renal disease: Secondary | ICD-10-CM | POA: Insufficient documentation

## 2020-05-10 DIAGNOSIS — I2510 Atherosclerotic heart disease of native coronary artery without angina pectoris: Secondary | ICD-10-CM | POA: Diagnosis not present

## 2020-05-10 DIAGNOSIS — M1990 Unspecified osteoarthritis, unspecified site: Secondary | ICD-10-CM | POA: Insufficient documentation

## 2020-05-10 DIAGNOSIS — H409 Unspecified glaucoma: Secondary | ICD-10-CM | POA: Insufficient documentation

## 2020-05-10 DIAGNOSIS — Z85828 Personal history of other malignant neoplasm of skin: Secondary | ICD-10-CM | POA: Insufficient documentation

## 2020-05-10 DIAGNOSIS — I132 Hypertensive heart and chronic kidney disease with heart failure and with stage 5 chronic kidney disease, or end stage renal disease: Secondary | ICD-10-CM | POA: Insufficient documentation

## 2020-05-10 DIAGNOSIS — K219 Gastro-esophageal reflux disease without esophagitis: Secondary | ICD-10-CM | POA: Insufficient documentation

## 2020-05-10 DIAGNOSIS — F17210 Nicotine dependence, cigarettes, uncomplicated: Secondary | ICD-10-CM | POA: Diagnosis not present

## 2020-05-10 DIAGNOSIS — E785 Hyperlipidemia, unspecified: Secondary | ICD-10-CM | POA: Diagnosis not present

## 2020-05-10 DIAGNOSIS — Z90721 Acquired absence of ovaries, unilateral: Secondary | ICD-10-CM | POA: Diagnosis not present

## 2020-05-10 DIAGNOSIS — I5030 Unspecified diastolic (congestive) heart failure: Secondary | ICD-10-CM | POA: Diagnosis not present

## 2020-05-10 DIAGNOSIS — I701 Atherosclerosis of renal artery: Secondary | ICD-10-CM | POA: Diagnosis present

## 2020-05-10 DIAGNOSIS — I252 Old myocardial infarction: Secondary | ICD-10-CM | POA: Diagnosis not present

## 2020-05-10 DIAGNOSIS — Z79899 Other long term (current) drug therapy: Secondary | ICD-10-CM | POA: Diagnosis not present

## 2020-05-10 DIAGNOSIS — Z91041 Radiographic dye allergy status: Secondary | ICD-10-CM | POA: Insufficient documentation

## 2020-05-10 DIAGNOSIS — R079 Chest pain, unspecified: Secondary | ICD-10-CM | POA: Diagnosis present

## 2020-05-10 DIAGNOSIS — Z90710 Acquired absence of both cervix and uterus: Secondary | ICD-10-CM | POA: Diagnosis not present

## 2020-05-10 DIAGNOSIS — I739 Peripheral vascular disease, unspecified: Secondary | ICD-10-CM | POA: Diagnosis not present

## 2020-05-10 DIAGNOSIS — Z881 Allergy status to other antibiotic agents status: Secondary | ICD-10-CM | POA: Insufficient documentation

## 2020-05-10 DIAGNOSIS — Z833 Family history of diabetes mellitus: Secondary | ICD-10-CM | POA: Insufficient documentation

## 2020-05-10 DIAGNOSIS — Z8051 Family history of malignant neoplasm of kidney: Secondary | ICD-10-CM | POA: Insufficient documentation

## 2020-05-10 DIAGNOSIS — I509 Heart failure, unspecified: Secondary | ICD-10-CM | POA: Diagnosis not present

## 2020-05-10 DIAGNOSIS — Z8249 Family history of ischemic heart disease and other diseases of the circulatory system: Secondary | ICD-10-CM | POA: Insufficient documentation

## 2020-05-10 DIAGNOSIS — I150 Renovascular hypertension: Principal | ICD-10-CM | POA: Insufficient documentation

## 2020-05-10 DIAGNOSIS — J449 Chronic obstructive pulmonary disease, unspecified: Secondary | ICD-10-CM | POA: Insufficient documentation

## 2020-05-10 DIAGNOSIS — G2581 Restless legs syndrome: Secondary | ICD-10-CM | POA: Diagnosis not present

## 2020-05-10 DIAGNOSIS — Z800 Family history of malignant neoplasm of digestive organs: Secondary | ICD-10-CM | POA: Insufficient documentation

## 2020-05-10 DIAGNOSIS — Z808 Family history of malignant neoplasm of other organs or systems: Secondary | ICD-10-CM | POA: Insufficient documentation

## 2020-05-10 HISTORY — PX: RENAL ANGIOGRAPHY: CATH118260

## 2020-05-10 LAB — CBC
HCT: 39.9 % (ref 36.0–46.0)
Hemoglobin: 12.3 g/dL (ref 12.0–15.0)
MCH: 27.2 pg (ref 26.0–34.0)
MCHC: 30.8 g/dL (ref 30.0–36.0)
MCV: 88.3 fL (ref 80.0–100.0)
Platelets: 386 10*3/uL (ref 150–400)
RBC: 4.52 MIL/uL (ref 3.87–5.11)
RDW: 17.5 % — ABNORMAL HIGH (ref 11.5–15.5)
WBC: 9.4 10*3/uL (ref 4.0–10.5)
nRBC: 0 % (ref 0.0–0.2)

## 2020-05-10 LAB — BASIC METABOLIC PANEL
Anion gap: 14 (ref 5–15)
BUN: 15 mg/dL (ref 6–20)
CO2: 23 mmol/L (ref 22–32)
Calcium: 9.7 mg/dL (ref 8.9–10.3)
Chloride: 101 mmol/L (ref 98–111)
Creatinine, Ser: 1.92 mg/dL — ABNORMAL HIGH (ref 0.44–1.00)
GFR calc Af Amer: 33 mL/min — ABNORMAL LOW (ref >60)
GFR calc non Af Amer: 28 mL/min — ABNORMAL LOW (ref >60)
Glucose, Bld: 108 mg/dL — ABNORMAL HIGH (ref 70–99)
Potassium: 4.2 mmol/L (ref 3.5–5.1)
Sodium: 138 mmol/L (ref 135–145)

## 2020-05-10 SURGERY — RENAL ANGIOGRAPHY
Anesthesia: LOCAL

## 2020-05-10 MED ORDER — GABAPENTIN 400 MG PO CAPS
400.0000 mg | ORAL_CAPSULE | Freq: Two times a day (BID) | ORAL | Status: DC
  Administered 2020-05-10 – 2020-05-11 (×2): 400 mg via ORAL
  Filled 2020-05-10 (×2): qty 1

## 2020-05-10 MED ORDER — SODIUM CHLORIDE 0.9 % IV SOLN: 250.0000 mL | INTRAVENOUS | Status: DC | PRN

## 2020-05-10 MED ORDER — TRAZODONE HCL 100 MG PO TABS: 100.0000 mg | ORAL_TABLET | Freq: Every evening | ORAL | Status: DC | PRN

## 2020-05-10 MED ORDER — LABETALOL HCL 5 MG/ML IV SOLN: 10.0000 mg | INTRAVENOUS | Status: DC | PRN

## 2020-05-10 MED ORDER — FAMOTIDINE IN NACL 20-0.9 MG/50ML-% IV SOLN: 20.0000 mg | INTRAVENOUS | Status: DC

## 2020-05-10 MED ORDER — ONDANSETRON HCL 4 MG/2ML IJ SOLN
4.0000 mg | Freq: Four times a day (QID) | INTRAMUSCULAR | Status: DC | PRN
  Administered 2020-05-11: 4 mg via INTRAVENOUS
  Filled 2020-05-10: qty 2

## 2020-05-10 MED ORDER — ACETAMINOPHEN 325 MG PO TABS: 650.0000 mg | ORAL_TABLET | ORAL | Status: DC | PRN

## 2020-05-10 MED ORDER — SODIUM CHLORIDE 0.9% FLUSH: 3.0000 mL | INTRAVENOUS | Status: DC | PRN

## 2020-05-10 MED ORDER — ASPIRIN 81 MG PO CHEW
CHEWABLE_TABLET | ORAL | Status: DC | PRN
  Administered 2020-05-10: 324 mg via ORAL

## 2020-05-10 MED ORDER — HEPARIN (PORCINE) IN NACL 1000-0.9 UT/500ML-% IV SOLN
INTRAVENOUS | Status: DC | PRN
  Administered 2020-05-10 (×2): 500 mL

## 2020-05-10 MED ORDER — HYDRALAZINE HCL 20 MG/ML IJ SOLN: 5.0000 mg | INTRAMUSCULAR | Status: DC | PRN

## 2020-05-10 MED ORDER — HYDRALAZINE HCL 25 MG PO TABS
25.0000 mg | ORAL_TABLET | Freq: Three times a day (TID) | ORAL | Status: DC
  Administered 2020-05-10 – 2020-05-11 (×2): 25 mg via ORAL
  Filled 2020-05-10 (×2): qty 1

## 2020-05-10 MED ORDER — CLOPIDOGREL BISULFATE 300 MG PO TABS
ORAL_TABLET | ORAL | Status: DC | PRN
  Administered 2020-05-10: 300 mg via ORAL

## 2020-05-10 MED ORDER — VENLAFAXINE HCL ER 150 MG PO CP24
150.0000 mg | ORAL_CAPSULE | Freq: Every day | ORAL | Status: DC
  Administered 2020-05-11: 150 mg via ORAL
  Filled 2020-05-10: qty 2
  Filled 2020-05-10: qty 1

## 2020-05-10 MED ORDER — AZELASTINE HCL 0.1 % NA SOLN
1.0000 | Freq: Every day | NASAL | Status: DC | PRN
  Filled 2020-05-10: qty 30

## 2020-05-10 MED ORDER — HEPARIN (PORCINE) IN NACL 1000-0.9 UT/500ML-% IV SOLN
INTRAVENOUS | Status: AC
  Filled 2020-05-10: qty 1000

## 2020-05-10 MED ORDER — FENTANYL CITRATE (PF) 100 MCG/2ML IJ SOLN
INTRAMUSCULAR | Status: DC | PRN
  Administered 2020-05-10: 50 ug via INTRAVENOUS
  Administered 2020-05-10 (×2): 25 ug via INTRAVENOUS
  Administered 2020-05-10 (×2): 50 ug via INTRAVENOUS

## 2020-05-10 MED ORDER — CLOPIDOGREL BISULFATE 75 MG PO TABS
75.0000 mg | ORAL_TABLET | Freq: Every day | ORAL | 2 refills | Status: DC
Start: 2020-05-10 — End: 2020-08-07

## 2020-05-10 MED ORDER — SODIUM CHLORIDE 0.9 % IV SOLN: INTRAVENOUS | Status: DC

## 2020-05-10 MED ORDER — CLOPIDOGREL BISULFATE 300 MG PO TABS
ORAL_TABLET | ORAL | Status: AC
  Filled 2020-05-10: qty 1

## 2020-05-10 MED ORDER — LIDOCAINE HCL (PF) 1 % IJ SOLN
INTRAMUSCULAR | Status: DC | PRN
  Administered 2020-05-10: 15 mL

## 2020-05-10 MED ORDER — DIPHENHYDRAMINE HCL 50 MG/ML IJ SOLN
25.0000 mg | INTRAMUSCULAR | Status: AC
  Administered 2020-05-10: 25 mg via INTRAVENOUS
  Filled 2020-05-10: qty 1

## 2020-05-10 MED ORDER — PANTOPRAZOLE SODIUM 40 MG PO TBEC: 80.0000 mg | DELAYED_RELEASE_TABLET | Freq: Every day | ORAL | Status: DC

## 2020-05-10 MED ORDER — HEPARIN SODIUM (PORCINE) 1000 UNIT/ML IJ SOLN
INTRAMUSCULAR | Status: AC
  Filled 2020-05-10: qty 1

## 2020-05-10 MED ORDER — ATORVASTATIN CALCIUM 80 MG PO TABS
80.0000 mg | ORAL_TABLET | Freq: Every day | ORAL | Status: DC
  Administered 2020-05-10 – 2020-05-11 (×2): 80 mg via ORAL
  Filled 2020-05-10 (×2): qty 1

## 2020-05-10 MED ORDER — NITROGLYCERIN 0.4 MG SL SUBL
SUBLINGUAL_TABLET | SUBLINGUAL | Status: AC
  Filled 2020-05-10: qty 1

## 2020-05-10 MED ORDER — IODIXANOL 320 MG/ML IV SOLN
INTRAVENOUS | Status: DC | PRN
  Administered 2020-05-10: 25 mL via INTRA_ARTERIAL

## 2020-05-10 MED ORDER — ATENOLOL 25 MG PO TABS
25.0000 mg | ORAL_TABLET | Freq: Every day | ORAL | Status: DC
  Administered 2020-05-10: 25 mg via ORAL
  Filled 2020-05-10 (×2): qty 1

## 2020-05-10 MED ORDER — FENTANYL CITRATE (PF) 100 MCG/2ML IJ SOLN
INTRAMUSCULAR | Status: AC
  Filled 2020-05-10: qty 2

## 2020-05-10 MED ORDER — ASPIRIN 81 MG PO CHEW
CHEWABLE_TABLET | ORAL | Status: AC
  Filled 2020-05-10: qty 4

## 2020-05-10 MED ORDER — ALBUTEROL SULFATE (2.5 MG/3ML) 0.083% IN NEBU: 2.5000 mg | INHALATION_SOLUTION | Freq: Two times a day (BID) | RESPIRATORY_TRACT | Status: DC | PRN

## 2020-05-10 MED ORDER — ALPRAZOLAM 0.5 MG PO TABS
1.0000 mg | ORAL_TABLET | Freq: Two times a day (BID) | ORAL | Status: DC | PRN
  Filled 2020-05-10: qty 2

## 2020-05-10 MED ORDER — SODIUM CHLORIDE 0.9 % IV BOLUS
500.0000 mL | Freq: Once | INTRAVENOUS | Status: AC
  Administered 2020-05-10: 500 mL via INTRAVENOUS

## 2020-05-10 MED ORDER — ALBUTEROL SULFATE HFA 108 (90 BASE) MCG/ACT IN AERS: 2.0000 | INHALATION_SPRAY | Freq: Two times a day (BID) | RESPIRATORY_TRACT | Status: DC | PRN

## 2020-05-10 MED ORDER — ASPIRIN EC 81 MG PO TBEC
81.0000 mg | DELAYED_RELEASE_TABLET | Freq: Every day | ORAL | 2 refills | Status: DC
Start: 2020-05-10 — End: 2020-08-27

## 2020-05-10 MED ORDER — SODIUM CHLORIDE 0.9 % IV SOLN: INTRAVENOUS | Status: AC

## 2020-05-10 MED ORDER — AMLODIPINE BESYLATE 10 MG PO TABS
10.0000 mg | ORAL_TABLET | Freq: Every day | ORAL | Status: DC
  Administered 2020-05-10 – 2020-05-11 (×2): 10 mg via ORAL
  Filled 2020-05-10 (×2): qty 1

## 2020-05-10 MED ORDER — METHYLPREDNISOLONE SODIUM SUCC 125 MG IJ SOLR
125.0000 mg | INTRAMUSCULAR | Status: AC
  Administered 2020-05-10: 125 mg via INTRAVENOUS
  Filled 2020-05-10: qty 2

## 2020-05-10 MED ORDER — ISOSORBIDE MONONITRATE ER 60 MG PO TB24
60.0000 mg | ORAL_TABLET | Freq: Every day | ORAL | Status: DC
  Administered 2020-05-11: 60 mg via ORAL
  Filled 2020-05-10: qty 1

## 2020-05-10 MED ORDER — EZETIMIBE 10 MG PO TABS
10.0000 mg | ORAL_TABLET | Freq: Every day | ORAL | Status: DC
  Administered 2020-05-10 – 2020-05-11 (×2): 10 mg via ORAL
  Filled 2020-05-10 (×2): qty 1

## 2020-05-10 MED ORDER — PANTOPRAZOLE SODIUM 40 MG PO TBEC
80.0000 mg | DELAYED_RELEASE_TABLET | Freq: Every day | ORAL | Status: DC
  Administered 2020-05-11: 80 mg via ORAL
  Filled 2020-05-10: qty 2

## 2020-05-10 MED ORDER — MIDAZOLAM HCL 2 MG/2ML IJ SOLN
INTRAMUSCULAR | Status: DC | PRN
  Administered 2020-05-10: 2 mg via INTRAVENOUS

## 2020-05-10 MED ORDER — SODIUM CHLORIDE 0.9% FLUSH: 3.0000 mL | Freq: Two times a day (BID) | INTRAVENOUS | Status: DC

## 2020-05-10 MED ORDER — MIDAZOLAM HCL 2 MG/2ML IJ SOLN
INTRAMUSCULAR | Status: AC
  Filled 2020-05-10: qty 2

## 2020-05-10 MED ORDER — NITROGLYCERIN 0.4 MG SL SUBL: 0.4000 mg | SUBLINGUAL_TABLET | SUBLINGUAL | Status: DC | PRN

## 2020-05-10 MED ORDER — HEPARIN SODIUM (PORCINE) 1000 UNIT/ML IJ SOLN
INTRAMUSCULAR | Status: DC | PRN
  Administered 2020-05-10: 5000 [IU] via INTRAVENOUS
  Administered 2020-05-10: 2000 [IU] via INTRAVENOUS

## 2020-05-10 MED ORDER — LIDOCAINE HCL (PF) 1 % IJ SOLN
INTRAMUSCULAR | Status: AC
  Filled 2020-05-10: qty 30

## 2020-05-10 MED ORDER — ACETAMINOPHEN 500 MG PO TABS
1000.0000 mg | ORAL_TABLET | Freq: Three times a day (TID) | ORAL | Status: DC | PRN
  Administered 2020-05-10: 1000 mg via ORAL
  Filled 2020-05-10 (×4): qty 2

## 2020-05-10 SURGICAL SUPPLY — 30 items
BALLN SAPPHIRE ~~LOC~~ 3.0X15 (BALLOONS) ×1 IMPLANT
BALLN VIATRAC 6X15X135 (BALLOONS) ×2
BALLN WOLVERINE 4.00X10 (BALLOONS) ×2
BALLOON VIATRAC 6X15X135 (BALLOONS) IMPLANT
BALLOON WOLVERINE 4.00X10 (BALLOONS) IMPLANT
CATH ANGIO 5F PIGTAIL 65CM (CATHETERS) ×1 IMPLANT
CATH CROSS OVER TEMPO 5F (CATHETERS) ×2 IMPLANT
COVER DOME SNAP 22 D (MISCELLANEOUS) ×2 IMPLANT
DEVICE CLOSURE PERCLS PRGLD 6F (VASCULAR PRODUCTS) IMPLANT
FILTER CO2 0.2 MICRON (VASCULAR PRODUCTS) ×2 IMPLANT
GUIDE CATH VISTA JR4 6F (CATHETERS) ×2 IMPLANT
KIT ENCORE 26 ADVANTAGE (KITS) ×1 IMPLANT
KIT ESSENTIALS PG (KITS) ×1 IMPLANT
KIT MICROPUNCTURE NIT STIFF (SHEATH) ×1 IMPLANT
KIT PV (KITS) ×2 IMPLANT
PERCLOSE PROGLIDE 6F (VASCULAR PRODUCTS) ×4
PROTECTION STATION PRESSURIZED (MISCELLANEOUS) ×2
RESERVOIR CO2 (VASCULAR PRODUCTS) ×1 IMPLANT
SET FLUSH CO2 (MISCELLANEOUS) ×1 IMPLANT
SHEATH PINNACLE 5F 10CM (SHEATH) ×1 IMPLANT
SHEATH PINNACLE 6F 10CM (SHEATH) ×1 IMPLANT
SHEATH PROBE COVER 6X72 (BAG) ×1 IMPLANT
STATION PROTECTION PRESSURIZED (MISCELLANEOUS) IMPLANT
STOPCOCK MORSE 400PSI 3WAY (MISCELLANEOUS) ×1 IMPLANT
TRANSDUCER W/STOPCOCK (MISCELLANEOUS) ×2 IMPLANT
TRAY PV CATH (CUSTOM PROCEDURE TRAY) ×2 IMPLANT
WIRE BENTSON .035X145CM (WIRE) ×1 IMPLANT
WIRE EMERALD 3MM-J .035X150CM (WIRE) ×1 IMPLANT
WIRE HI TORQ COMMND ES.014X300 (WIRE) ×1 IMPLANT
WIRE STABILIZER XS .014X180CM (WIRE) ×1 IMPLANT

## 2020-05-10 NOTE — H&P (Signed)
OV 6/24 copied for documentation    Primary Physician/Referring:  Bernerd Limbo, MD  [name], female    DOB: 1961-04-30, 59 y.o.   MRN: [id number]  No chief complaint on file.  HPI:    [name]  is a 59 y.o. Caucasian female  with hypertension, COPD, diastolic CHF, ongoing tobacco abuse (2 cig a day), and difficult to control hypertension with CKD, with bilateral renal artery angioplasty, PAD with distal abdominal aorta stenosis S/P stenting with 10x 29 mm Omnilink Elite stent. She underwent coronary angiogram in June 2019 for nitroglycerin responsive chest pain that revealed normal coronary arteries.   I have seen her 6 weeks ago, she was having frequent episodes of angina and also was using nitroglycerin fairly frequently.  She had also reduce smoking.  However on my evaluation she was found to be severely anemic and she was placed on oral iron supplements by her PCP.  She has noticed improvement in symptoms of angina and she has not used nitroglycerin anymore.  Dyspnea has remained stable.  No PND or orthopnea.  She still continues to have fatigue.  Still having difficulty with smoking cessation.  Past Medical History:  Diagnosis Date  . Anemia 2008  . Anxiety   . Aortic stenosis   . Arthritis    "all my joints ache" (09/07/2014)  . Asthma   . Bipolar 1 disorder ([place])   . Bradycardia   . Bruit   . Carpal tunnel syndrome   . Cervical cancer ([place]) 1985  . CHF (congestive heart failure) ([place])   . Chronic kidney disease (CKD), stage V ([place])   . COPD (chronic obstructive pulmonary disease) ([place]) 2000  . Coronary artery disease   . Daily headache   . Depression 2000  . Diverticulitis 2008  . GERD (gastroesophageal reflux disease)   . Glaucoma   . Heart murmur   . History of colon polyps 2009  . HLD (hyperlipidemia) 2013  . Hypertension 2013  . Hypovitaminosis D   . IBS (irritable bowel syndrome) 2008  . Myocardial infarction ([place])     2015  . PAD (peripheral artery disease) ([place])   . Pancreatitis 10/2011  . Pneumonia 07/2014  . RLS (restless legs syndrome)   . Schizophrenia ([place])   . Shortness of breath   . Skin cancer   . Small bowel obstruction ([place]) 2008   Past Surgical History:  Procedure Laterality Date  . ABDOMINAL ANGIOGRAM N/A 09/07/2014   Procedure: ABDOMINAL ANGIOGRAM;  Surgeon: Laverda Page, MD;  Location: [facility] CATH LAB;  Service: Cardiovascular;  Laterality: N/A;  . APPENDECTOMY  1996  . COLON SURGERY  2008   6 inches of colon removed due to obstruction  . COLONOSCOPY WITH PROPOFOL N/A 10/25/2016   Procedure: COLONOSCOPY WITH PROPOFOL;  Surgeon: Milus Banister, MD;  Location: [facility] ENDOSCOPY;  Service: Endoscopy;  Laterality: N/A;  . CORONARY ANGIOPLASTY WITH STENT PLACEMENT  09/07/2014   "2"  . LEFT HEART CATH AND CORONARY ANGIOGRAPHY N/A 03/25/2018   Procedure: LEFT HEART CATH AND CORONARY ANGIOGRAPHY;  Surgeon: Nigel Mormon, MD;  Location: [place] CV LAB;  Service: Cardiovascular;  Laterality: N/A;  . LEFT HEART CATHETERIZATION WITH CORONARY ANGIOGRAM N/A 09/07/2014   Procedure: LEFT HEART CATHETERIZATION WITH CORONARY ANGIOGRAM;  Surgeon: Laverda Page, MD;  Location: [facility] CATH LAB;  Service: Cardiovascular;  Laterality: N/A;  . LOWER EXTREMITY ANGIOGRAPHY  10/29/2017   Procedure: Lower Extremity Angiography;  Surgeon: Adrian Prows, MD;  Location: [place] CV LAB;  Service: Cardiovascular;;  . PERIPHERAL VASCULAR CATHETERIZATION N/A 11/01/2015   Procedure: Renal Angiography;  Surgeon: Adrian Prows, MD;  Location: [place] CV LAB;  Service: Cardiovascular;  Laterality: N/A;  . PERIPHERAL VASCULAR CATHETERIZATION N/A 04/10/2016   Procedure: Renal Angiography;  Surgeon: Adrian Prows, MD;  Location: [place] CV LAB;  Service: Cardiovascular;  Laterality: N/A;  . PERIPHERAL VASCULAR CATHETERIZATION  04/10/2016   Procedure: Peripheral Vascular Intervention;  Surgeon: Adrian Prows, MD;  Location: [place] CV LAB;  Service: Cardiovascular;;  . PERIPHERAL VASCULAR INTERVENTION  10/29/2017   Procedure: PERIPHERAL VASCULAR INTERVENTION;  Surgeon: Adrian Prows, MD;  Location: [place] CV LAB;  Service: Cardiovascular;;  . RENAL ANGIOGRAPHY N/A 10/29/2017   Procedure: RENAL ANGIOGRAPHY;  Surgeon: Adrian Prows, MD;  Location: [place] CV LAB;  Service: Cardiovascular;  Laterality: N/A;  . RIGHT OOPHORECTOMY Right 1996  . TOTAL ABDOMINAL HYSTERECTOMY  1997   Family History  Problem Relation Age of Onset  . Other Mother        many bowel obstructions  . Heart disease Mother   . Colon polyps Mother   . Kidney cancer Father   . Bone cancer Father   . Diabetes Father   . Heart disease Father   . Diabetes Daughter   . Colon cancer Paternal Grandfather   . Heart disease Brother   . Esophageal cancer Neg Hx     Social History   Tobacco Use  . Smoking status: Current Every Day Smoker    Packs/day: 0.25    Years: 40.00    Pack years: 10.00    Types: Cigarettes  . Smokeless tobacco: Never Used  Substance Use Topics  . Alcohol use: No   Marital Status: Divorced  ROS  Review of Systems  Constitutional: Negative for weight gain.  Cardiovascular: Positive for chest pain and dyspnea on exertion. Negative for claudication, leg swelling and syncope.  Musculoskeletal: Negative for joint swelling.  Neurological: Positive for dizziness.  Psychiatric/Behavioral: Positive for depression.   Objective  Blood pressure (!) 188/94, pulse (!) 56, temperature 97.8 F (36.6 C), temperature source Oral, resp. rate 15, height 5' 1.75" (1.568 m), weight 49.9 kg, SpO2 98 %.  Vitals with BMI 05/10/2020 03/24/2020 03/24/2020  Height 5' 1.75" - 5' 1.75"  Weight 110 lbs - 110 lbs 10 oz  BMI 92.42 - 68.3  Systolic 419 622 297  Diastolic 94 96 97  Pulse 56 - 59     Physical Exam Constitutional:      General: She is not in acute distress.    Appearance: She is well-developed.  Cardiovascular:      Rate and Rhythm: Normal rate and regular rhythm.     Pulses: Normal pulses and intact distal pulses.          Carotid pulses are on the right side with bruit and on the left side with bruit.    Heart sounds: Murmur heard.  Early systolic murmur is present with a grade of 2/6 at the upper right sternal border and upper left sternal border.  No gallop.      Comments: No leg edema. No JVD.   Pulmonary:     Effort: Pulmonary effort is normal. No accessory muscle usage or respiratory distress.     Breath sounds: Normal breath sounds.  Abdominal:     Palpations: Abdomen is soft.    Laboratory examination:  Lipid Panel     Component Value Date/Time  CHOL 119 03/31/2020 1336   TRIG 92 03/31/2020 1336   HDL 50 03/31/2020 1336   CHOLHDL 2.5 10/14/2019 1408   CHOLHDL 3.7 10/08/2012 0228   VLDL 30 10/08/2012 0228   LDLCALC 51 03/31/2020 1336   External labs:   Lab 03/01/2020:  Hb 10.9/HCT 34.4, normal indicis, platelet counts 376.  Serum glucose 100 mg, BUN 21, creatinine 2.59, EGFR 20 mL.  Sodium 140, potassium 5.9.  LFTs normal.  11/05/2019:   Glucose 101 (H) 65 - 99 mg/dL  BUN 13 6 - 24 mg/dL  Creatinine, Serum 0.90 0.57 - 1 mg/dL  eGFR If NonAfrican American 71 >59 mL/min/1.73  eGFR If African American 82 >59 mL/min/1.73  BUN/Creatinine Ratio 14 9 - 23   Sodium 138 134 - 144 mmol/L  Potassium 5.8 (H) 3.5 - 5.2 mmol/L  Chloride 102 96 - 106 mmol/L  CO2 22 20 - 29 mmol/L  CALCIUM 10.4 (H) 8.7 - 10.2 mg/dL  Total Protein 7.0 6 - 8.5 g/dL  Albumin, Serum 4.6 3.8 - 4.9 g/dL  Globulin, Total 2.4 1.5 - 4.5 g/dL  Albumin/Globulin Ratio 1.9 1.2 - 2.2   Total Bilirubin 0.2 0 - 1.2 mg/dL  Alkaline Phosphatase 117 39 - 117 IU/L  AST 25 0 - 40 IU/L  ALT (SGPT) 9 0 - 32 IU/L   TIBC 493 (H) 250 - 450 ug/dL [facility]   UIBC 132 131 - 425 ug/dL [facility] 1   Iron 361 (H) 27 - 159 ug/dL [facility]   Iron Saturation 73 (H) 15 - 55 % [facility]    Hb 7.1/HCT 24.8, microcytic indicis.   Platelets 160. Cholesterol, Total 187 100 - 199 mg/dL [facility]   Triglycerides 120 0 - 149 mg/dL [facility]   HDL 47 >39 mg/dL [facility]   VLDL Cholesterol Cal 22 5 - 40 mg/dL [facility]   LDL 118 (H) 0 - 99 mg/dL [facility] 1   LDL/HDL Ratio 2.5           0.0 - 3.2 ratio      Medications and allergies   Allergies  Allergen Reactions  . Doxycycline Anaphylaxis and Hives  . Hydrocodone-Acetaminophen Nausea And Vomiting  . Iohexol Itching    Pt has itching nose after iv contrast injection     Current Outpatient Medications  Medication Instructions  . acetaminophen (TYLENOL) 1,000 mg, Oral, Every 8 hours PRN  . albuterol (PROVENTIL HFA;VENTOLIN HFA) 108 (90 BASE) MCG/ACT inhaler 2 puffs, Inhalation, 2 times daily PRN  . ALPRAZolam (XANAX) 1 MG tablet 1 tablet, Oral, 2 times daily PRN  . amLODipine (NORVASC) 10 mg, Oral, Daily  . atenolol (TENORMIN) 25 mg, Oral, 2 times daily  . atorvastatin (LIPITOR) 80 mg, Oral, Daily  . azelastine (ASTELIN) 0.1 % nasal spray 1 spray, Each Nare, Daily PRN  . ezetimibe (ZETIA) 10 mg, Oral, Daily  . gabapentin (NEURONTIN) 400 mg, Oral, 2 times daily, Patient takes 400 mg 3 times daily and 1200 mg at bedtime  . hydrALAZINE (APRESOLINE) 25 mg, Oral, 3 times daily  . isosorbide dinitrate (ISORDIL) 30 mg, Oral, 3 times daily  . isosorbide mononitrate (IMDUR) 60 mg, Oral, Daily  . mometasone-formoterol (DULERA) 100-5 MCG/ACT AERO Take 2 puffs first thing in am and then another 2 puffs about 12 hours later.  . nitroGLYCERIN (NITROSTAT) 0.4 mg, Sublingual, Every 5 min PRN  . pantoprazole (PROTONIX) 40 MG tablet TAKE 1 TABLET BY MOUTH TWICE (2) DAILY  . traZODone (DESYREL) 100  mg, Oral, At bedtime PRN  . venlafaxine XR (EFFEXOR XR) 150 mg, Oral, Daily with breakfast   Radiology:   No results found.  Cardiac Studies:   Renal arteriogram 02/29/2018: Widely patient stents. 04/10/2016: Scoring balloon angioplasty left in-stent restenosis with  6 x 20 mm angiosculpt balloon.  Right renal artery stent widely patent. H/O bilateral renal stenting on 09/07/2014: Right renal artery 6.0 x 15 mm Herculink widely patent, left renal artery  6.0 x 18 mm Herculink stent.  Renal artery duplex 09/18/2017: Hemodynamically significant stenosis of the right renal artery. Peak Velocity of 308/66 cm/s. Normal intrarenal vascular perfusion is noted in both kidneys. There is increased echogenecity of both kidneys suggests medico-renal disease with right kidney slightly shrunk at 8.03x3.97x4 cm compared to 9.144.664.6 cm Diffuse plaque noted in the abdominal aorta with calcification.  Compared to the study done on 01/22/2017, left renal artery stenosis not evident, right renal artery stenosis new.  Patient has h/o bilateral renal artery stenting.  Abdominal aortogram 10/29/2017: Widely patent renal arteries. Distal abdominal aorta diffuse disease, calcification with 80% stenosis S/P 10 x 29 mm Omnilink Elite stent, 80% to 0%.  Lexiscan myoview stress test 02/17/2018:  1. Lexiscan stress test was performed. Exercise capacity was not assessed. Stress symptoms included dizziness, nausea, headache, and chest pressure.. Peak blood pressure was 176/88 mmHg. Stress EKG is non diagnostic for ischemia as it is a pharmacologic stress. In addition, it showed The stress electrocardiogram showed sinus tachycardia, normal stress conduction, left ventricular hypertrophy, no stress arrhythmias and normal stress repolarization. 2. The overall quality of the study is excellent. Left ventricular cavity is noted to be normal on the rest and stress studies. Gated SPECT images reveal normal myocardial thickening and wall motion. The left ventricular ejection fraction was calculated or visually estimated to be 78%. SPECT images demonstrate small perfusion abnormality of moderate intensity in the mid anterolateral and apical lateral myocardial wall(s) on the stress images, reversible on  rest images. Findings suggest small area of moderate intensity ischemia in mid to apical anterolateral myocardium. Intermediate risk study.  Cardiac Cath 03/25/2018: Normal coronary arteries.  Lower extremity arterial duplex 12/05/2018: No hemodynamically significant stenoses are identified in the lower extremity arterial system.  This exam reveals moderately decreased perfusion of the right lower extremity, noted at the dorsalis pedis and post tibial artery level (ABI 0.68) and mildly decreased perfusion of the left lower extremity, noted at the post tibial artery level (ABI 0.83). Triphasic waveform the the major vessels suggest diffuse small vessel disease. Left proximal SFA has < 50% stenosis.  208/06/2016, mild progression of PAD with decreased ABI bilaterally compared to left ABI 0.95 and right ABI 0.94.  Abdominal Aortic Duplex  05/27/2019: Moderate plaque noted in the proximal, mid and distal aorta.  No abdominal aortic aneurysm. Distal abdominal aortic velocity is mildly elevated suggestive of <50% stenosis. Patient has h/o distal abdominal aortic stenosis and stenting on 10/29/2017. The stent appears patent.   Carotid artery duplex  10/09/2019: Doppler flow velocities in the right internal carotid artery are consistent with stenosis in the range of 50-69% with >= 50% heterogeneous plaque. Doppler flow velocities in the left internal carotid artery are consistent with stenosis in the range of 50-69% with >= 50% heterogeneous plaque. Antegrade right vertebral artery flow. Antegrade left vertebral artery flow. Compared to the study done on 04/17/2019, this progression of disease, right ICA stenosis is new. Follow up in six months is appropriate if clinically indicated.  Echocardiogram 10/16/2019:  Normal LV systolic function with EF 60%. Left ventricle cavity is normal in size. Normal global wall motion. Normal diastolic filling pattern.  Calculated EF 60%.  Left atrial cavity is mildly  dilated in 4 chamber views.  Structurally normal mitral valve. Mild (Grade I) mitral regurgitation.  Structurally normal tricuspid valve. Mild tricuspid regurgitation. No evidence of pulmonary hypertension.  Compared to 02/25/2017, no significant change.   EKG  EKG 03/24/2020: Normal sinus rhythm with rate of 58 bpm, normal axis, no evidence of ischemia.  Borderline criteria for LVH with R in V5 26 mm.   No significant change from EKG 02/10/2020.  Assessment   No diagnosis found.   Meds ordered this encounter  Medications  . 0.9 %  sodium chloride infusion  . sodium chloride flush (NS) 0.9 % injection 3 mL  . sodium chloride flush (NS) 0.9 % injection 3 mL  . 0.9 %  sodium chloride infusion  . methylPREDNISolone sodium succinate (SOLU-MEDROL) 125 mg/2 mL injection 125 mg  . DISCONTD: famotidine (PEPCID) IVPB 20 mg premix  . diphenhydrAMINE (BENADRYL) injection 25 mg  . sodium chloride 0.9 % bolus 500 mL    Medications Discontinued During This Encounter  Medication Reason  . famotidine (PEPCID) IVPB 20 mg premix     Recommendations:   [name]  is a 59 y.o. Caucasian female  with hypertension, COPD, diastolic CHF, ongoing tobacco abuse (2 cig a day), and difficult to control hypertension with CKD, with bilateral renal artery angioplasty, PAD with distal abdominal aorta stenosis S/P stenting with 10x 29 mm Omnilink Elite stent. She underwent coronary angiogram in June 2019 for nitroglycerin responsive chest pain that revealed normal coronary arteries.   On her last office visit she was having frequent episodes of angina which I suspect is related to severe iron deficiency anemia.  I reviewed her external labs, hemoglobin is improved.  However she is having severe constipation since being on oral iron, she has discontinued this.  I will let her PCP know.  It may be worthwhile to consider iron transfusion.  On review of the external labs, I also noticed that she has now  developed stage IV chronic kidney disease, this is new to her.  I would like to repeat another BMP to confirm worsening renal function and if indeed her BMP confirms this, she needs nephrology consultation as well.  With regards resident hypertension, I will discontinue isosorbide mononitrate and switch her to isosorbide dinitrate 25 mg 3 times daily along with hydralazine 25 mg p.o. 3 times daily, she can always take an extra dose if systolic blood pressure is >150 mmHg.  I like to see him back in 6 weeks for follow-up.  Concerned about carotid disease, I have reassured her.  She will need continued surveillance of carotid stenosis.  She is now tolerating Zetia, I will recheck lipids.  Smoking cessation was discussed.  Patient has enrolled herself in "quit now" program and is trying her best.  Adrian Prows, MD, [facility] 05/10/2020, 10:26 AM [facility] [place] Pager: [phone] Office: [phone]

## 2020-05-10 NOTE — Progress Notes (Signed)
Report called and client transferred to 3-E-22 via bed

## 2020-05-10 NOTE — Interval H&P Note (Signed)
History and Physical Interval Note:  05/10/2020 11:27 AM  [name]  has presented today for surgery, with the diagnosis of hp - elevator blooc pressure.  The various methods of treatment have been discussed with the patient and family. After consideration of risks, benefits and other options for treatment, the patient has consented to  Procedure(s): RENAL ANGIOGRAPHY (N/A) and possible renal artery angioplasty as a surgical intervention.  [name] history has been reviewed, patient examined, no change in status, stable for surgery.  I have reviewed the patient's chart and labs.  Questions were answered to the patient's satisfaction.     [name]

## 2020-05-10 NOTE — Progress Notes (Addendum)
Client c/o 3/10 chest pain and 4/10 right flank pain; right groin without hematoma or bleeding noted Dr Virgina Jock notified

## 2020-05-10 NOTE — Progress Notes (Signed)
When took NTG into room client states chest discomfort is gone

## 2020-05-11 ENCOUNTER — Ambulatory Visit: Payer: Medicare Other | Admitting: Cardiology

## 2020-05-11 ENCOUNTER — Encounter (HOSPITAL_COMMUNITY): Payer: Self-pay | Admitting: Cardiology

## 2020-05-11 ENCOUNTER — Other Ambulatory Visit: Payer: Self-pay | Admitting: Cardiology

## 2020-05-11 DIAGNOSIS — I10 Essential (primary) hypertension: Secondary | ICD-10-CM

## 2020-05-11 DIAGNOSIS — I150 Renovascular hypertension: Secondary | ICD-10-CM | POA: Diagnosis not present

## 2020-05-11 LAB — BASIC METABOLIC PANEL
Anion gap: 9 (ref 5–15)
BUN: 20 mg/dL (ref 6–20)
CO2: 26 mmol/L (ref 22–32)
Calcium: 9.5 mg/dL (ref 8.9–10.3)
Chloride: 102 mmol/L (ref 98–111)
Creatinine, Ser: 2.09 mg/dL — ABNORMAL HIGH (ref 0.44–1.00)
GFR calc Af Amer: 30 mL/min — ABNORMAL LOW (ref >60)
GFR calc non Af Amer: 26 mL/min — ABNORMAL LOW (ref >60)
Glucose, Bld: 104 mg/dL — ABNORMAL HIGH (ref 70–99)
Potassium: 4.8 mmol/L (ref 3.5–5.1)
Sodium: 137 mmol/L (ref 135–145)

## 2020-05-11 LAB — POCT ACTIVATED CLOTTING TIME
Activated Clotting Time: 230 seconds
Activated Clotting Time: 268 seconds

## 2020-05-11 NOTE — Discharge Summary (Signed)
Physician Discharge Summary  [name] MRN: [id number] DOB/AGE: Apr 29, 1961 59 y.o. Cheyenne Limbo, MD   Admit date: 05/10/2020 Discharge date: 05/11/2020  Primary Discharge Diagnosis Renovascular hypertension Bilateral renal artery stents Successful but incomplete revascularization of the occluded left renal artery  Significant Diagnostic Studies:  Renal arteriogram 05/10/2020: Patent Rt renal stent (placed 2015, 6.0 x 15 mm Herculink) 100% restenosis of Lt renals stent with flush occlusion 6.0 x 18 mm Herculink)  Successful angioplasty Lt renal stent Antegrade dissection in distal Lt renal artery with decreased distal flow, too distal for stenting. There is blush in left kidney. Still has residual stenosis.  She will be monitored for 6-8 hrs for any signs of renal infarct.    Radiology: PERIPHERAL VASCULAR CATHETERIZATION  Result Date: 05/10/2020 Patent Rt renal stent (placed 2015,  6.0 x 15 mm Herculink) 100% restenosis of Lt renals stent with flush occlusion 6.0 x 18 mm Herculink) Successful angioplasty Lt renal stent Antegrade dissection in distal Lt renal artery with decreased distal flow, too distal for stenting. There is blush in left kidney. She will be monitored for 6-8 hrs for any signs of renal infarct. Cheyenne Mormon, MD [facility] [place] Pager: [phone] Office: [phone]     Hospital Course: [name] is a 59 y.o. female  patient Caucasian female  with hypertension, COPD, diastolic CHF, ongoing tobacco abuse (2 cig a day), and difficult to control hypertension with CKD, with bilateral renal artery angioplasty, PAD with distal abdominal aorta stenosis S/P stenting with 10x 29 mm Omnilink Elite stent. She underwent coronary angiogram in June 2019 for nitroglycerin responsive chest pain that revealed normal coronary arteries.  Recently she has had acute worsening renal function, difficult to control hypertension in  spite of being on greater than 5 antihypertensive medications, hence felt that she has probably developed renal artery stenosis.  I did not feel like we should have renal artery duplex but probably proceed directly with angiography.  She underwent elective angiography and was found to have occluded left renal artery.  She underwent successful but partial revascularization of the left renal artery in-stent restenosis.  The following morning her blood pressure had improved, presenting blood pressure was greater than 664 mmHg systolic which improved to 139/87 mmHg, renal function has improved with a creatinine from 2.59 to the present 2.0, she is without any abdominal discomfort, hematuria or flank pain.)  Also looks stable without any hematoma.  Recommendations on discharge: She will probably need relook renal angiography in 3 to 4 weeks, if she indeed has patent renal artery, I may need to refer her to either [facility] or [facility] to consider laser atherectomy of the renal artery.  Plaque burden is significant. ASA 81 mg/day, Plavix 75mg /day.  She will also need repeat BMP in the next 1 week or so.  Discharge Exam: Vitals with BMI 05/11/2020 05/11/2020 05/11/2020  Height - - -  Weight - 109 lbs 8 oz -  BMI - 40.3 -  Systolic 474 259 563  Diastolic 87 79 73  Pulse 57 61 60    Physical Exam Constitutional:      General: She is not in acute distress.    Appearance: She is well-developed.  Cardiovascular:     Rate and Rhythm: Normal rate and regular rhythm.     Pulses: Normal pulses and intact distal pulses.          Carotid pulses are on the right side with bruit and  on the left side with bruit.    Heart sounds: Murmur heard.  Early systolic murmur is present with a grade of 2/6 at the upper right sternal border and upper left sternal border.  No gallop.      Comments: No leg edema. No JVD.   Pulmonary:     Effort: Pulmonary effort is normal. No accessory muscle usage or  respiratory distress.     Breath sounds: Normal breath sounds.  Abdominal:     Palpations: Abdomen is soft.    Labs:   Lab Results  Component Value Date   WBC 9.4 05/10/2020   HGB 12.3 05/10/2020   HCT 39.9 05/10/2020   MCV 88.3 05/10/2020   PLT 386 05/10/2020    Recent Labs  Lab 05/11/20 0617  NA 137  K 4.8  CL 102  CO2 26  BUN 20  CREATININE 2.09*  CALCIUM 9.5  GLUCOSE 104*   Lipid Panel     Component Value Date/Time   CHOL 119 03/31/2020 1336   TRIG 92 03/31/2020 1336   HDL 50 03/31/2020 1336   CHOLHDL 2.5 10/14/2019 1408   CHOLHDL 3.7 10/08/2012 0228   VLDL 30 10/08/2012 0228   LDLCALC 51 03/31/2020 1336   FOLLOW UP PLANS AND APPOINTMENTS  Allergies as of 05/11/2020      Reactions   Doxycycline Anaphylaxis, Hives   Hydrocodone-acetaminophen Nausea And Vomiting   Iohexol Itching   Pt has itching nose after iv contrast injection      Medication List    TAKE these medications   acetaminophen 500 MG tablet Commonly known as: TYLENOL Take 1,000 mg by mouth every 8 (eight) hours as needed for mild pain or headache.   albuterol 108 (90 Base) MCG/ACT inhaler Commonly known as: VENTOLIN HFA Inhale 2 puffs into the lungs 2 (two) times daily as needed for wheezing or shortness of breath (asthma).   ALPRAZolam 1 MG tablet Commonly known as: XANAX Take 1 tablet by mouth 2 (two) times daily as needed for anxiety.   amLODipine 10 MG tablet Commonly known as: NORVASC Take 1 tablet (10 mg total) by mouth daily.   aspirin EC 81 MG tablet Take 1 tablet (81 mg total) by mouth daily.   atenolol 25 MG tablet Commonly known as: TENORMIN Take 1 tablet (25 mg total) by mouth 2 (two) times daily. What changed: when to take this   atorvastatin 80 MG tablet Commonly known as: LIPITOR Take 80 mg by mouth daily.   azelastine 0.1 % nasal spray Commonly known as: ASTELIN Place 1 spray into both nostrils daily as needed for rhinitis.   clopidogrel 75 MG  tablet Commonly known as: Plavix Take 1 tablet (75 mg total) by mouth daily.   ezetimibe 10 MG tablet Commonly known as: ZETIA Take 1 tablet (10 mg total) by mouth daily.   gabapentin 400 MG capsule Commonly known as: NEURONTIN Take 1 capsule (400 mg total) by mouth 2 (two) times daily. Patient takes 400 mg 3 times daily and 1200 mg at bedtime What changed:   how much to take  when to take this  additional instructions   hydrALAZINE 25 MG tablet Commonly known as: APRESOLINE Take 1 tablet (25 mg total) by mouth 3 (three) times daily.   isosorbide dinitrate 30 MG tablet Commonly known as: ISORDIL Take 1 tablet (30 mg total) by mouth 3 (three) times daily.   isosorbide mononitrate 60 MG 24 hr tablet Commonly known as: IMDUR Take 60 mg by  mouth daily.   mometasone-formoterol 100-5 MCG/ACT Aero Commonly known as: DULERA Take 2 puffs first thing in am and then another 2 puffs about 12 hours later. What changed:   how much to take  how to take this  when to take this  additional instructions   nitroGLYCERIN 0.4 MG SL tablet Commonly known as: NITROSTAT Place 1 tablet (0.4 mg total) under the tongue every 5 (five) minutes as needed for chest pain.   pantoprazole 40 MG tablet Commonly known as: PROTONIX TAKE 1 TABLET BY MOUTH TWICE (2) DAILY What changed: See the new instructions.   traZODone 100 MG tablet Commonly known as: DESYREL Take 100 mg by mouth at bedtime as needed for sleep.   venlafaxine XR 150 MG 24 hr capsule Commonly known as: Effexor XR Take 1 capsule (150 mg total) by mouth daily with breakfast. What changed: how much to take       Follow-up Information    Adrian Prows, MD Follow up on 05/27/2020.   Specialty: Cardiology Why: 05/27/2020 10:30 AM. Bring all medications and arrive 15 min early Contact information: [place] [address] [phone]                Adrian Prows, MD, [facility] 05/11/2020, 10:12  AM Office: [phone]

## 2020-05-11 NOTE — Plan of Care (Signed)
?  Problem: Education: ?Goal: Ability to demonstrate management of disease process will improve ?Outcome: Progressing ?  ?

## 2020-05-20 ENCOUNTER — Ambulatory Visit: Payer: Medicare Other | Admitting: Cardiology

## 2020-05-26 ENCOUNTER — Ambulatory Visit: Payer: Medicare Other | Admitting: Cardiology

## 2020-05-27 ENCOUNTER — Encounter: Payer: Self-pay | Admitting: Cardiology

## 2020-05-27 ENCOUNTER — Other Ambulatory Visit: Payer: Self-pay

## 2020-05-27 ENCOUNTER — Ambulatory Visit: Payer: Medicare Other | Admitting: Cardiology

## 2020-05-27 VITALS — BP 91/52 | HR 61 | Resp 16 | Ht 61.0 in | Wt 110.0 lb

## 2020-05-27 DIAGNOSIS — Z9889 Other specified postprocedural states: Secondary | ICD-10-CM

## 2020-05-27 DIAGNOSIS — I701 Atherosclerosis of renal artery: Secondary | ICD-10-CM

## 2020-05-27 DIAGNOSIS — I10 Essential (primary) hypertension: Secondary | ICD-10-CM

## 2020-05-27 DIAGNOSIS — N184 Chronic kidney disease, stage 4 (severe): Secondary | ICD-10-CM

## 2020-05-27 MED ORDER — AMLODIPINE BESYLATE 10 MG PO TABS: 5.0000 mg | ORAL_TABLET | Freq: Every day | ORAL | 1 refills | Status: DC

## 2020-05-27 NOTE — Progress Notes (Signed)
Primary Physician/Referring:  Bernerd Limbo, MD  [name], female    DOB: 1961-02-03, 59 y.o.   MRN: [id number]  Chief Complaint  Patient presents with  . Dizziness  . Hypertension  . Hyperlipidemia  . Follow-up    6 week  . Chest Pain   HPI:    [name]  is a 59 y.o. Caucasian female  with hypertension, COPD, diastolic CHF, ongoing tobacco abuse (2 cig a day), and difficult to control hypertension with CKD, with bilateral renal artery angioplasty, PAD with distal abdominal aorta stenosis S/P stenting with 10x 29 mm Omnilink Elite stent. She underwent coronary angiogram in June 2019 for nitroglycerin responsive chest pain that revealed normal coronary arteries.   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  She underwent a partially successful balloon angioplasty with antegrade dissection noted in the renal artery and hopefully this has remained patent.  She has now having frequent episodes of dizziness and near syncope and very low blood pressure.  She has not had any further chest pain. Dyspnea is stable. Still having difficulty with smoking cessation.  Past Medical History:  Diagnosis Date  . Anemia 2008  . Anxiety   . Aortic stenosis   . Arthritis    "all my joints ache" (09/07/2014)  . Asthma   . Bipolar 1 disorder ([place])   . Bradycardia   . Bruit   . Carpal tunnel syndrome   . Cervical cancer ([place]) 1985  . CHF (congestive heart failure) ([place])   . Chronic kidney disease (CKD), stage V ([place])   . COPD (chronic obstructive pulmonary disease) ([place]) 2000  . Coronary artery disease   . Daily headache   . Depression 2000  . Diverticulitis 2008  . GERD (gastroesophageal reflux disease)   . Glaucoma   . Heart murmur   . History of colon polyps 2009  . HLD (hyperlipidemia) 2013  . Hypertension 2013  .  Hypovitaminosis D   . IBS (irritable bowel syndrome) 2008  . Myocardial infarction ([place])    2015  . PAD (peripheral artery disease) ([place])   . Pancreatitis 10/2011  . Pneumonia 07/2014  . RLS (restless legs syndrome)   . Schizophrenia ([place])   . Shortness of breath   . Skin cancer   . Small bowel obstruction ([place]) 2008   Past Surgical History:  Procedure Laterality Date  . ABDOMINAL ANGIOGRAM N/A 09/07/2014   Procedure: ABDOMINAL ANGIOGRAM;  Surgeon: Laverda Page, MD;  Location: [facility] CATH LAB;  Service: Cardiovascular;  Laterality: N/A;  . APPENDECTOMY  1996  . COLON SURGERY  2008   6 inches of colon removed due to obstruction  . COLONOSCOPY WITH PROPOFOL N/A 10/25/2016   Procedure: COLONOSCOPY WITH PROPOFOL;  Surgeon: Milus Banister, MD;  Location: [facility] ENDOSCOPY;  Service: Endoscopy;  Laterality: N/A;  . CORONARY ANGIOPLASTY WITH STENT PLACEMENT  09/07/2014   "2"  . LEFT HEART CATH AND CORONARY ANGIOGRAPHY N/A 03/25/2018   Procedure: LEFT HEART CATH AND CORONARY ANGIOGRAPHY;  Surgeon: Nigel Mormon, MD;  Location: [place] CV LAB;  Service: Cardiovascular;  Laterality: N/A;  . LEFT HEART CATHETERIZATION WITH CORONARY ANGIOGRAM N/A 09/07/2014   Procedure: LEFT HEART CATHETERIZATION WITH CORONARY ANGIOGRAM;  Surgeon: Laverda Page, MD;  Location: [facility] CATH LAB;  Service: Cardiovascular;  Laterality: N/A;  . LOWER EXTREMITY ANGIOGRAPHY  10/29/2017   Procedure: Lower Extremity  Angiography;  Surgeon: Adrian Prows, MD;  Location: [place] CV LAB;  Service: Cardiovascular;;  . PERIPHERAL VASCULAR CATHETERIZATION N/A 11/01/2015   Procedure: Renal Angiography;  Surgeon: Adrian Prows, MD;  Location: [place] CV LAB;  Service: Cardiovascular;  Laterality: N/A;  . PERIPHERAL VASCULAR CATHETERIZATION N/A 04/10/2016   Procedure: Renal Angiography;  Surgeon: Adrian Prows, MD;  Location: [place] CV LAB;  Service: Cardiovascular;  Laterality: N/A;  . PERIPHERAL VASCULAR CATHETERIZATION   04/10/2016   Procedure: Peripheral Vascular Intervention;  Surgeon: Adrian Prows, MD;  Location: [place] CV LAB;  Service: Cardiovascular;;  . PERIPHERAL VASCULAR INTERVENTION  10/29/2017   Procedure: PERIPHERAL VASCULAR INTERVENTION;  Surgeon: Adrian Prows, MD;  Location: [place] CV LAB;  Service: Cardiovascular;;  . RENAL ANGIOGRAPHY N/A 10/29/2017   Procedure: RENAL ANGIOGRAPHY;  Surgeon: Adrian Prows, MD;  Location: [place] CV LAB;  Service: Cardiovascular;  Laterality: N/A;  . RENAL ANGIOGRAPHY N/A 05/10/2020   Procedure: RENAL ANGIOGRAPHY;  Surgeon: Nigel Mormon, MD;  Location: [place] CV LAB;  Service: Cardiovascular;  Laterality: N/A;  . RIGHT OOPHORECTOMY Right 1996  . TOTAL ABDOMINAL HYSTERECTOMY  1997   Family History  Problem Relation Age of Onset  . Other Mother        many bowel obstructions  . Heart disease Mother   . Colon polyps Mother   . Kidney cancer Father   . Bone cancer Father   . Diabetes Father   . Heart disease Father   . Diabetes Daughter   . Colon cancer Paternal Grandfather   . Heart disease Brother   . Esophageal cancer Neg Hx     Social History   Tobacco Use  . Smoking status: Current Every Day Smoker    Packs/day: 0.25    Years: 40.00    Pack years: 10.00    Types: Cigarettes  . Smokeless tobacco: Never Used  Substance Use Topics  . Alcohol use: No   Marital Status: Divorced  ROS  Review of Systems  Constitutional: Negative for weight gain.  Cardiovascular: Positive for dyspnea on exertion and near-syncope. Negative for chest pain, claudication and leg swelling.  Musculoskeletal: Negative for joint swelling.  Neurological: Positive for dizziness.  Psychiatric/Behavioral: Positive for depression.   Objective  Blood pressure (!) 91/52, pulse 61, resp. rate 16, height '5\' 1"'  (1.549 m), weight 110 lb (49.9 kg), SpO2 96 %.  Vitals with BMI 05/27/2020 05/27/2020 05/27/2020  Height - - -  Weight - - -  BMI - - -  Systolic 91 403  754  Diastolic 52 61 62  Pulse 61 56 55     Physical Exam Constitutional:      General: She is not in acute distress.    Appearance: She is well-developed.  Cardiovascular:     Rate and Rhythm: Normal rate and regular rhythm.     Pulses: Normal pulses and intact distal pulses.          Carotid pulses are on the right side with bruit and on the left side with bruit.    Heart sounds: Murmur heard.  Early systolic murmur is present with a grade of 2/6 at the upper right sternal border and upper left sternal border.  No gallop.      Comments: No leg edema. No JVD.   Pulmonary:     Effort: Pulmonary effort is normal. No accessory muscle usage or respiratory distress.     Breath sounds: Normal breath sounds.  Abdominal:  Palpations: Abdomen is soft.    Laboratory examination:   CMP Latest Ref Rng & Units 05/11/2020 05/10/2020 05/04/2020  Glucose 70 - 99 mg/dL 104(H) 108(H) 80  BUN 6 - 20 mg/dL '20 15 22  ' Creatinine 0.44 - 1.00 mg/dL 2.09(H) 1.92(H) 2.42(H)  Sodium 135 - 145 mmol/L 137 138 138  Potassium 3.5 - 5.1 mmol/L 4.8 4.2 5.3(H)  Chloride 98 - 111 mmol/L 102 101 99  CO2 22 - 32 mmol/L '26 23 23  ' Calcium 8.9 - 10.3 mg/dL 9.5 9.7 9.7  Total Protein 6.5 - 8.1 g/dL - - -  Total Bilirubin 0.3 - 1.2 mg/dL - - -  Alkaline Phos 38 - 126 U/L - - -  AST 15 - 41 U/L - - -  ALT 14 - 54 U/L - - -   CBC Latest Ref Rng & Units 05/10/2020 03/31/2020 06/05/2017  WBC 4.0 - 10.5 K/uL 9.4 6.9 7.0  Hemoglobin 12.0 - 15.0 g/dL 12.3 10.8(L) 11.6(L)  Hematocrit 36 - 46 % 39.9 35.1 35.9(L)  Platelets 150 - 400 K/uL 386 405 402(H)   Lipid Panel Recent Labs    10/14/19 1408 03/31/20 1336  CHOL 134 119  TRIG 81 92  LDLCALC 64 51  HDL 54 50  CHOLHDL 2.5  --     HEMOGLOBIN A1C No results found for: HGBA1C, MPG TSH No results for input(s): TSH in the last 8760 hours.   External labs:   Lab 03/01/2020:  Hb 10.9/HCT 34.4, normal indicis, platelet counts 376.  Serum glucose 100 mg, BUN 21,  creatinine 2.59, EGFR 20 mL.  Sodium 140, potassium 5.9.  LFTs normal.  Medications and allergies   Allergies  Allergen Reactions  . Doxycycline Anaphylaxis and Hives  . Hydrocodone-Acetaminophen Nausea And Vomiting  . Iohexol Itching    Pt has itching nose after iv contrast injection    Outpatient Medications Prior to Visit  Medication Sig Dispense Refill  . acetaminophen (TYLENOL) 500 MG tablet Take 1,000 mg by mouth every 8 (eight) hours as needed for mild pain or headache.    . albuterol (PROVENTIL HFA;VENTOLIN HFA) 108 (90 BASE) MCG/ACT inhaler Inhale 2 puffs into the lungs 2 (two) times daily as needed for wheezing or shortness of breath (asthma).     . ALPRAZolam (XANAX) 1 MG tablet Take 1 tablet by mouth 2 (two) times daily as needed for anxiety.     Marland Kitchen aspirin EC 81 MG tablet Take 1 tablet (81 mg total) by mouth daily. 30 tablet 2  . atenolol (TENORMIN) 25 MG tablet Take 1 tablet (25 mg total) by mouth 2 (two) times daily. (Patient taking differently: Take 25 mg by mouth daily. ) 180 tablet 1  . atorvastatin (LIPITOR) 80 MG tablet Take 80 mg by mouth daily.     Marland Kitchen azelastine (ASTELIN) 0.1 % nasal spray Place 1 spray into both nostrils daily as needed for rhinitis.     Marland Kitchen clopidogrel (PLAVIX) 75 MG tablet Take 1 tablet (75 mg total) by mouth daily. 30 tablet 2  . ezetimibe (ZETIA) 10 MG tablet Take 1 tablet (10 mg total) by mouth daily. 90 tablet 3  . gabapentin (NEURONTIN) 400 MG capsule Take 1 capsule (400 mg total) by mouth 2 (two) times daily. Patient takes 400 mg 3 times daily and 1200 mg at bedtime (Patient taking differently: Take 800 mg by mouth at bedtime. )    . hydrALAZINE (APRESOLINE) 25 MG tablet Take 1 tablet (25 mg total) by mouth 3 (  three) times daily. 90 tablet 2  . isosorbide mononitrate (IMDUR) 60 MG 24 hr tablet Take 60 mg by mouth daily.    . mometasone-formoterol (DULERA) 100-5 MCG/ACT AERO Take 2 puffs first thing in am and then another 2 puffs about 12 hours  later. (Patient taking differently: Inhale 2 puffs into the lungs daily. ) 1 Inhaler 11  . nitroGLYCERIN (NITROSTAT) 0.4 MG SL tablet Place 1 tablet (0.4 mg total) under the tongue every 5 (five) minutes as needed for chest pain. 30 tablet 1  . pantoprazole (PROTONIX) 40 MG tablet TAKE 1 TABLET BY MOUTH TWICE (2) DAILY (Patient taking differently: Take 80 mg by mouth daily. ) 60 tablet 2  . traZODone (DESYREL) 100 MG tablet Take 100 mg by mouth at bedtime as needed for sleep.     Marland Kitchen venlafaxine XR (EFFEXOR XR) 150 MG 24 hr capsule Take 1 capsule (150 mg total) by mouth daily with breakfast. (Patient taking differently: Take 300 mg by mouth daily with breakfast. ) 30 capsule 0  . amLODipine (NORVASC) 10 MG tablet Take 1 tablet (10 mg total) by mouth daily. 30 tablet 1  . isosorbide dinitrate (ISORDIL) 30 MG tablet Take 1 tablet (30 mg total) by mouth 3 (three) times daily. 90 tablet 2   No facility-administered medications prior to visit.   Radiology:   No results found.  Cardiac Studies:   Renal arteriogram 02/29/2018: Widely patient stents. 04/10/2016: Scoring balloon angioplasty left in-stent restenosis with 6 x 20 mm angiosculpt balloon.  Right renal artery stent widely patent. H/O bilateral renal stenting on 09/07/2014: Right renal artery 6.0 x 15 mm Herculink widely patent, left renal artery  6.0 x 18 mm Herculink stent.  Renal artery duplex 09/18/2017: Hemodynamically significant stenosis of the right renal artery. Peak Velocity of 308/66 cm/s. Normal intrarenal vascular perfusion is noted in both kidneys. There is increased echogenecity of both kidneys suggests medico-renal disease with right kidney slightly shrunk at 8.03x3.97x4 cm compared to 9.144.664.6 cm Diffuse plaque noted in the abdominal aorta with calcification.  Compared to the study done on 01/22/2017, left renal artery stenosis not evident, right renal artery stenosis new.  Patient has h/o bilateral renal artery  stenting.  Abdominal aortogram 10/29/2017: Widely patent renal arteries. Distal abdominal aorta diffuse disease, calcification with 80% stenosis S/P 10 x 29 mm Omnilink Elite stent, 80% to 0%.  Lexiscan myoview stress test 02/17/2018:  1. Lexiscan stress test was performed. Exercise capacity was not assessed. Stress symptoms included dizziness, nausea, headache, and chest pressure.. Peak blood pressure was 176/88 mmHg. Stress EKG is non diagnostic for ischemia as it is a pharmacologic stress. In addition, it showed The stress electrocardiogram showed sinus tachycardia, normal stress conduction, left ventricular hypertrophy, no stress arrhythmias and normal stress repolarization. 2. The overall quality of the study is excellent. Left ventricular cavity is noted to be normal on the rest and stress studies. Gated SPECT images reveal normal myocardial thickening and wall motion. The left ventricular ejection fraction was calculated or visually estimated to be 78%. SPECT images demonstrate small perfusion abnormality of moderate intensity in the mid anterolateral and apical lateral myocardial wall(s) on the stress images, reversible on rest images. Findings suggest small area of moderate intensity ischemia in mid to apical anterolateral myocardium. Intermediate risk study.  Cardiac Cath 03/25/2018: Normal coronary arteries.  Lower extremity arterial duplex 12/05/2018: No hemodynamically significant stenoses are identified in the lower extremity arterial system.  This exam reveals moderately decreased perfusion of the right  lower extremity, noted at the dorsalis pedis and post tibial artery level (ABI 0.68) and mildly decreased perfusion of the left lower extremity, noted at the post tibial artery level (ABI 0.83). Triphasic waveform the the major vessels suggest diffuse small vessel disease. Left proximal SFA has < 50% stenosis.  208/06/2016, mild progression of PAD with decreased ABI bilaterally compared to  left ABI 0.95 and right ABI 0.94.  Abdominal Aortic Duplex  05/27/2019: Moderate plaque noted in the proximal, mid and distal aorta.  No abdominal aortic aneurysm. Distal abdominal aortic velocity is mildly elevated suggestive of <50% stenosis. Patient has h/o distal abdominal aortic stenosis and stenting on 10/29/2017. The stent appears patent.   Carotid artery duplex  10/09/2019: Doppler flow velocities in the right internal carotid artery are consistent with stenosis in the range of 50-69% with >= 50% heterogeneous plaque. Doppler flow velocities in the left internal carotid artery are consistent with stenosis in the range of 50-69% with >= 50% heterogeneous plaque. Antegrade right vertebral artery flow. Antegrade left vertebral artery flow. Compared to the study done on 04/17/2019, this progression of disease, right ICA stenosis is new. Follow up in six months is appropriate if clinically indicated.  Echocardiogram 10/16/2019:  Normal LV systolic function with EF 60%. Left ventricle cavity is normal in size. Normal global wall motion. Normal diastolic filling pattern.  Calculated EF 60%.  Left atrial cavity is mildly dilated in 4 chamber views.  Structurally normal mitral valve. Mild (Grade I) mitral regurgitation.  Structurally normal tricuspid valve. Mild tricuspid regurgitation. No evidence of pulmonary hypertension.  Compared to 02/25/2017, no significant change.   Renal angiogram 05/10/2020: Patent Rt renal stent (placed 2015, 6.0 x 15 mm Herculink) 100% restenosis of Lt renals stent with flush occlusion 6.0 x 18 mm Herculink)  Successful angioplasty Lt renal stent Antegrade dissection in distal Lt renal artery with decreased distal flow, too distal for stenting. There is blush in left kidney.  She will be monitored for 6-8 hrs for any signs of renal infarct.   EKG  EKG 03/24/2020: Normal sinus rhythm with rate of 58 bpm, normal axis, no evidence of ischemia.  Borderline  criteria for LVH with R in V5 26 mm.   No significant change from EKG 02/10/2020.  Assessment     ICD-10-CM   1. Resistant hypertension  I10 EKG 12-Lead    amLODipine (NORVASC) 10 MG tablet    PCV RENAL/RENAL ARTERY DUPLEX COMPLETE  2. CKD (chronic kidney disease) stage 4, GFR 15-29 ml/min (HCC)  N18.4   3. Bilateral renal artery stenosis (HCC)  I70.1 PCV RENAL/RENAL ARTERY DUPLEX COMPLETE  4. History of stent insertion of renal artery  Z98.890 PCV RENAL/RENAL ARTERY DUPLEX COMPLETE     Meds ordered this encounter  Medications  . amLODipine (NORVASC) 10 MG tablet    Sig: Take 0.5 tablets (5 mg total) by mouth daily.    Dispense:  30 tablet    Refill:  1    Medications Discontinued During This Encounter  Medication Reason  . isosorbide dinitrate (ISORDIL) 30 MG tablet Discontinued by provider  . amLODipine (NORVASC) 10 MG tablet     Recommendations:   [name]  is a 59 y.o. Caucasian female  with hypertension, COPD, diastolic CHF, ongoing tobacco abuse (2 cig a day), and difficult to control hypertension with CKD, with bilateral renal artery angioplasty, PAD with distal abdominal aorta stenosis S/P stenting with 10x 29 mm Omnilink Elite stent. She underwent coronary angiogram in June  2019 for nitroglycerin responsive chest pain that revealed normal coronary arteries.   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  She underwent a partially successful balloon angioplasty with antegrade dissection noted in the renal artery and hopefully this has remained patent.  She has now having frequent episodes of dizziness and near syncope and very low blood pressure.  I reduce the dose of amlodipine from 10 mg to 5 mg, discontinued isosorbide mononitrate in view of low blood pressure.  I am hoping that her renal function will have improved since angioplasty, she  now has stage IV chronic kidney disease probably related to occlusion of the left renal artery.  I would like to obtain a BMP today and see her back in 6 weeks.  I will repeat renal duplex to evaluate for renal artery stenosis and surveillance and to establish a baseline.

## 2020-06-08 ENCOUNTER — Other Ambulatory Visit: Payer: Self-pay | Admitting: Cardiology

## 2020-06-15 ENCOUNTER — Other Ambulatory Visit: Payer: Self-pay

## 2020-06-15 ENCOUNTER — Ambulatory Visit: Payer: Medicare Other

## 2020-06-15 DIAGNOSIS — Z9889 Other specified postprocedural states: Secondary | ICD-10-CM

## 2020-06-15 DIAGNOSIS — I701 Atherosclerosis of renal artery: Secondary | ICD-10-CM

## 2020-06-15 DIAGNOSIS — I10 Essential (primary) hypertension: Secondary | ICD-10-CM

## 2020-06-16 ENCOUNTER — Telehealth: Payer: Self-pay | Admitting: Cardiology

## 2020-06-16 DIAGNOSIS — N184 Chronic kidney disease, stage 4 (severe): Secondary | ICD-10-CM

## 2020-06-16 DIAGNOSIS — I701 Atherosclerosis of renal artery: Secondary | ICD-10-CM

## 2020-06-16 NOTE — Telephone Encounter (Signed)
Can I place a referral to in network nephrology in [place], [place] is out of network for [place]. Also she needs blood work that is pended

## 2020-06-17 NOTE — Telephone Encounter (Signed)
Yes you can put a referral in Dr. Youlanda Roys or Dr Lynnea Ferrier  Or you can just let me know the DX and I will take care of it.

## 2020-06-18 NOTE — Telephone Encounter (Signed)
ICD-10-CM   1. CKD (chronic kidney disease) stage 4, GFR 15-29 ml/min (HCC)  N18.4 Ambulatory referral to Nephrology  2. Bilateral renal artery stenosis [facility])  I70.1 Ambulatory referral to Nephrology  Dr. Youlanda Roys     Adrian Prows, MD, [facility] 06/18/2020, 7:47 AM Office: [phone]

## 2020-06-25 LAB — BASIC METABOLIC PANEL
BUN/Creatinine Ratio: 11 (ref 9–23)
BUN: 27 mg/dL — ABNORMAL HIGH (ref 6–24)
CO2: 18 mmol/L — ABNORMAL LOW (ref 20–29)
Calcium: 9 mg/dL (ref 8.7–10.2)
Chloride: 108 mmol/L — ABNORMAL HIGH (ref 96–106)
Creatinine, Ser: 2.41 mg/dL — ABNORMAL HIGH (ref 0.57–1.00)
GFR calc Af Amer: 25 mL/min/{1.73_m2} — ABNORMAL LOW (ref >59)
GFR calc non Af Amer: 22 mL/min/{1.73_m2} — ABNORMAL LOW (ref >59)
Glucose: 116 mg/dL — ABNORMAL HIGH (ref 65–99)
Potassium: 5.5 mmol/L — ABNORMAL HIGH (ref 3.5–5.2)
Sodium: 141 mmol/L (ref 134–144)

## 2020-06-29 ENCOUNTER — Ambulatory Visit: Payer: Medicare Other | Admitting: Cardiology

## 2020-06-29 ENCOUNTER — Encounter: Payer: Self-pay | Admitting: Cardiology

## 2020-06-29 ENCOUNTER — Other Ambulatory Visit: Payer: Self-pay

## 2020-06-29 VITALS — BP 110/46 | HR 60 | Resp 15 | Ht 61.0 in | Wt 106.0 lb

## 2020-06-29 DIAGNOSIS — I701 Atherosclerosis of renal artery: Secondary | ICD-10-CM

## 2020-06-29 DIAGNOSIS — I10 Essential (primary) hypertension: Secondary | ICD-10-CM

## 2020-06-29 DIAGNOSIS — Z9889 Other specified postprocedural states: Secondary | ICD-10-CM

## 2020-06-29 DIAGNOSIS — I739 Peripheral vascular disease, unspecified: Secondary | ICD-10-CM

## 2020-06-29 DIAGNOSIS — T466X5A Adverse effect of antihyperlipidemic and antiarteriosclerotic drugs, initial encounter: Secondary | ICD-10-CM

## 2020-06-29 NOTE — Progress Notes (Signed)
Primary Physician/Referring:  Bernerd Limbo, MD  [name], female    DOB: 1960-12-13, 59 y.o.   MRN: [id number]  Chief Complaint  Patient presents with  . Follow-up    4 week  . Hypertension   HPI:    [name]  is a 59 y.o. Caucasian female  with hypertension, COPD, diastolic CHF, ongoing tobacco abuse (2 cig a day), and difficult to control hypertension with CKD, with bilateral renal artery angioplasty, PAD with distal abdominal aorta stenosis S/P stenting with 10x 29 mm Omnilink Elite stent. She underwent coronary angiogram in June 2019 for nitroglycerin responsive chest pain that revealed normal coronary arteries.   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  She underwent a partially successful balloon angioplasty with antegrade dissection noted in the renal artery and hopefully this has remained patent.  She is presently complaining of mild generalized aches and pains and states that her blood pressure has been well controlled since angioplasty.  No chest pain, dyspnea has remained stable.  Unfortunately still smoking a pack of cigarettes a day.  Past Medical History:  Diagnosis Date  . Anemia 2008  . Anxiety   . Aortic stenosis   . Arthritis    "all my joints ache" (09/07/2014)  . Asthma   . Bipolar 1 disorder ([place])   . Bradycardia   . Bruit   . Carpal tunnel syndrome   . Cervical cancer ([place]) 1985  . CHF (congestive heart failure) ([place])   . Chronic kidney disease (CKD), stage V ([place])   . COPD (chronic obstructive pulmonary disease) ([place]) 2000  . Coronary artery disease   . Daily headache   . Depression 2000  . Diverticulitis 2008  . GERD (gastroesophageal reflux disease)   . Glaucoma   . Heart murmur   . History of colon polyps 2009  . HLD (hyperlipidemia) 2013  . Hypertension 2013  . Hypovitaminosis D    . IBS (irritable bowel syndrome) 2008  . Myocardial infarction ([place])    2015  . PAD (peripheral artery disease) ([place])   . Pancreatitis 10/2011  . Pneumonia 07/2014  . RLS (restless legs syndrome)   . Schizophrenia ([place])   . Shortness of breath   . Skin cancer   . Small bowel obstruction ([place]) 2008   Past Surgical History:  Procedure Laterality Date  . ABDOMINAL ANGIOGRAM N/A 09/07/2014   Procedure: ABDOMINAL ANGIOGRAM;  Surgeon: Laverda Page, MD;  Location: [facility] CATH LAB;  Service: Cardiovascular;  Laterality: N/A;  . APPENDECTOMY  1996  . COLON SURGERY  2008   6 inches of colon removed due to obstruction  . COLONOSCOPY WITH PROPOFOL N/A 10/25/2016   Procedure: COLONOSCOPY WITH PROPOFOL;  Surgeon: Milus Banister, MD;  Location: [facility] ENDOSCOPY;  Service: Endoscopy;  Laterality: N/A;  . CORONARY ANGIOPLASTY WITH STENT PLACEMENT  09/07/2014   "2"  . LEFT HEART CATH AND CORONARY ANGIOGRAPHY N/A 03/25/2018   Procedure: LEFT HEART CATH AND CORONARY ANGIOGRAPHY;  Surgeon: Nigel Mormon, MD;  Location: [place] CV LAB;  Service: Cardiovascular;  Laterality: N/A;  . LEFT HEART CATHETERIZATION WITH CORONARY ANGIOGRAM N/A 09/07/2014   Procedure: LEFT HEART CATHETERIZATION WITH CORONARY ANGIOGRAM;  Surgeon: Laverda Page, MD;  Location: [facility] CATH LAB;  Service: Cardiovascular;  Laterality: N/A;  . LOWER EXTREMITY ANGIOGRAPHY  10/29/2017   Procedure: Lower Extremity Angiography;  Surgeon: Einar Gip,  Ulice Dash, MD;  Location: [place] CV LAB;  Service: Cardiovascular;;  . PERIPHERAL VASCULAR CATHETERIZATION N/A 11/01/2015   Procedure: Renal Angiography;  Surgeon: Adrian Prows, MD;  Location: [place] CV LAB;  Service: Cardiovascular;  Laterality: N/A;  . PERIPHERAL VASCULAR CATHETERIZATION N/A 04/10/2016   Procedure: Renal Angiography;  Surgeon: Adrian Prows, MD;  Location: [place] CV LAB;  Service: Cardiovascular;  Laterality: N/A;  . PERIPHERAL VASCULAR CATHETERIZATION  04/10/2016   Procedure:  Peripheral Vascular Intervention;  Surgeon: Adrian Prows, MD;  Location: [place] CV LAB;  Service: Cardiovascular;;  . PERIPHERAL VASCULAR INTERVENTION  10/29/2017   Procedure: PERIPHERAL VASCULAR INTERVENTION;  Surgeon: Adrian Prows, MD;  Location: [place] CV LAB;  Service: Cardiovascular;;  . RENAL ANGIOGRAPHY N/A 10/29/2017   Procedure: RENAL ANGIOGRAPHY;  Surgeon: Adrian Prows, MD;  Location: [place] CV LAB;  Service: Cardiovascular;  Laterality: N/A;  . RENAL ANGIOGRAPHY N/A 05/10/2020   Procedure: RENAL ANGIOGRAPHY;  Surgeon: Nigel Mormon, MD;  Location: [place] CV LAB;  Service: Cardiovascular;  Laterality: N/A;  . RIGHT OOPHORECTOMY Right 1996  . TOTAL ABDOMINAL HYSTERECTOMY  1997   Family History  Problem Relation Age of Onset  . Other Mother        many bowel obstructions  . Heart disease Mother   . Colon polyps Mother   . Kidney cancer Father   . Bone cancer Father   . Diabetes Father   . Heart disease Father   . Diabetes Daughter   . Colon cancer Paternal Grandfather   . Heart disease Brother   . Esophageal cancer Neg Hx     Social History   Tobacco Use  . Smoking status: Current Every Day Smoker    Packs/day: 0.25    Years: 40.00    Pack years: 10.00    Types: Cigarettes  . Smokeless tobacco: Never Used  Substance Use Topics  . Alcohol use: No   Marital Status: Divorced  ROS  Review of Systems  Cardiovascular: Positive for dyspnea on exertion. Negative for chest pain, claudication and leg swelling.  Musculoskeletal: Positive for muscle cramps and muscle weakness (diffuse). Negative for joint swelling.  Neurological: Positive for dizziness.  Psychiatric/Behavioral: Positive for depression.   Objective  Blood pressure (!) 110/46, pulse 60, resp. rate 15, height '5\' 1"'  (1.549 m), weight 106 lb (48.1 kg), SpO2 98 %.  Vitals with BMI 06/29/2020 06/29/2020 05/27/2020  Height - '5\' 1"'  -  Weight - 106 lbs -  BMI - 44.03 -  Systolic 474 259 91    Diastolic 46 33 52  Pulse 60 60 61     Physical Exam Constitutional:      General: She is not in acute distress.    Appearance: She is well-developed.  Cardiovascular:     Rate and Rhythm: Normal rate and regular rhythm.     Pulses:          Carotid pulses are on the right side with bruit and on the left side with bruit.      Femoral pulses are 2+ on the right side and 2+ on the left side.      Popliteal pulses are 2+ on the right side and 2+ on the left side.       Dorsalis pedis pulses are 0 on the right side and 2+ on the left side.       Posterior tibial pulses are 0 on the right side and 2+ on the left side.  Heart sounds: Murmur heard.  Early systolic murmur is present with a grade of 2/6 at the upper right sternal border and upper left sternal border.  No gallop.      Comments: No leg edema. No JVD.   Pulmonary:     Effort: Pulmonary effort is normal. No accessory muscle usage or respiratory distress.     Breath sounds: Normal breath sounds.  Abdominal:     Palpations: Abdomen is soft.    Laboratory examination:   CMP Latest Ref Rng & Units 06/24/2020 05/11/2020 05/10/2020  Glucose 65 - 99 mg/dL 116(H) 104(H) 108(H)  BUN 6 - 24 mg/dL 27(H) 20 15  Creatinine 0.57 - 1.00 mg/dL 2.41(H) 2.09(H) 1.92(H)  Sodium 134 - 144 mmol/L 141 137 138  Potassium 3.5 - 5.2 mmol/L 5.5(H) 4.8 4.2  Chloride 96 - 106 mmol/L 108(H) 102 101  CO2 20 - 29 mmol/L 18(L) 26 23  Calcium 8.7 - 10.2 mg/dL 9.0 9.5 9.7  Total Protein 6.5 - 8.1 g/dL - - -  Total Bilirubin 0.3 - 1.2 mg/dL - - -  Alkaline Phos 38 - 126 U/L - - -  AST 15 - 41 U/L - - -  ALT 14 - 54 U/L - - -   CBC Latest Ref Rng & Units 05/10/2020 03/31/2020 06/05/2017  WBC 4.0 - 10.5 K/uL 9.4 6.9 7.0  Hemoglobin 12.0 - 15.0 g/dL 12.3 10.8(L) 11.6(L)  Hematocrit 36 - 46 % 39.9 35.1 35.9(L)  Platelets 150 - 400 K/uL 386 405 402(H)   Lipid Panel Recent Labs    10/14/19 1408 03/31/20 1336  CHOL 134 119  TRIG 81 92  LDLCALC 64 51   HDL 54 50  CHOLHDL 2.5  --     HEMOGLOBIN A1C No results found for: HGBA1C, MPG TSH No results for input(s): TSH in the last 8760 hours.   External labs:   Lab 03/01/2020:  Hb 10.9/HCT 34.4, normal indicis, platelet counts 376.  Serum glucose 100 mg, BUN 21, creatinine 2.59, EGFR 20 mL.  Sodium 140, potassium 5.9.  LFTs normal.  Medications and allergies   Allergies  Allergen Reactions  . Doxycycline Anaphylaxis and Hives  . Hydrocodone-Acetaminophen Nausea And Vomiting  . Iohexol Itching    Pt has itching nose after iv contrast injection    Outpatient Medications Prior to Visit  Medication Sig Dispense Refill  . acetaminophen (TYLENOL) 500 MG tablet Take 1,000 mg by mouth every 8 (eight) hours as needed for mild pain or headache.    . albuterol (PROVENTIL HFA;VENTOLIN HFA) 108 (90 BASE) MCG/ACT inhaler Inhale 2 puffs into the lungs 2 (two) times daily as needed for wheezing or shortness of breath (asthma).     . ALPRAZolam (XANAX) 1 MG tablet Take 1 tablet by mouth 2 (two) times daily as needed for anxiety.     Marland Kitchen amLODipine (NORVASC) 10 MG tablet Take 0.5 tablets (5 mg total) by mouth daily. 30 tablet 1  . aspirin EC 81 MG tablet Take 1 tablet (81 mg total) by mouth daily. 30 tablet 2  . atenolol (TENORMIN) 25 MG tablet Take 1 tablet (25 mg total) by mouth 2 (two) times daily. (Patient taking differently: Take 25 mg by mouth daily. ) 180 tablet 1  . atorvastatin (LIPITOR) 80 MG tablet Take 80 mg by mouth daily. Hold for 4  Weeks starting 06/29/2020    . azelastine (ASTELIN) 0.1 % nasal spray Place 1 spray into both nostrils daily as needed for rhinitis.     Marland Kitchen  clopidogrel (PLAVIX) 75 MG tablet Take 1 tablet (75 mg total) by mouth daily. 30 tablet 2  . gabapentin (NEURONTIN) 400 MG capsule Take 1 capsule (400 mg total) by mouth 2 (two) times daily. Patient takes 400 mg 3 times daily and 1200 mg at bedtime (Patient taking differently: Take 800 mg by mouth at bedtime. )    .  hydrALAZINE (APRESOLINE) 25 MG tablet Take 1 tablet (25 mg total) by mouth 3 (three) times daily. 90 tablet 2  . isosorbide mononitrate (IMDUR) 60 MG 24 hr tablet Take 60 mg by mouth daily.    . mometasone-formoterol (DULERA) 100-5 MCG/ACT AERO Take 2 puffs first thing in am and then another 2 puffs about 12 hours later. (Patient taking differently: Inhale 2 puffs into the lungs daily. ) 1 Inhaler 11  . nitroGLYCERIN (NITROSTAT) 0.4 MG SL tablet Place 1 tablet (0.4 mg total) under the tongue every 5 (five) minutes as needed for chest pain. 30 tablet 1  . pantoprazole (PROTONIX) 40 MG tablet TAKE 1 TABLET BY MOUTH TWICE (2) DAILY 180 tablet 2  . traZODone (DESYREL) 100 MG tablet Take 100 mg by mouth at bedtime as needed for sleep.     Marland Kitchen venlafaxine XR (EFFEXOR XR) 150 MG 24 hr capsule Take 1 capsule (150 mg total) by mouth daily with breakfast. (Patient taking differently: Take 300 mg by mouth daily with breakfast. ) 30 capsule 0  . ezetimibe (ZETIA) 10 MG tablet Take 1 tablet (10 mg total) by mouth daily. 90 tablet 3   No facility-administered medications prior to visit.   Radiology:   No results found.  Cardiac Studies:   Renal arteriogram 02/29/2018: Widely patient stents. 04/10/2016: Scoring balloon angioplasty left in-stent restenosis with 6 x 20 mm angiosculpt balloon.  Right renal artery stent widely patent. H/O bilateral renal stenting on 09/07/2014: Right renal artery 6.0 x 15 mm Herculink widely patent, left renal artery  6.0 x 18 mm Herculink stent.  Renal artery duplex 09/18/2017: Hemodynamically significant stenosis of the right renal artery. Peak Velocity of 308/66 cm/s. Normal intrarenal vascular perfusion is noted in both kidneys. There is increased echogenecity of both kidneys suggests medico-renal disease with right kidney slightly shrunk at 8.03x3.97x4 cm compared to 9.144.664.6 cm Diffuse plaque noted in the abdominal aorta with calcification.  Compared to the study done on  01/22/2017, left renal artery stenosis not evident, right renal artery stenosis new.  Patient has h/o bilateral renal artery stenting.  Abdominal aortogram 10/29/2017: Widely patent renal arteries. Distal abdominal aorta diffuse disease, calcification with 80% stenosis S/P 10 x 29 mm Omnilink Elite stent, 80% to 0%.  Lexiscan myoview stress test 02/17/2018:  1. Lexiscan stress test was performed. Exercise capacity was not assessed. Stress symptoms included dizziness, nausea, headache, and chest pressure.. Peak blood pressure was 176/88 mmHg. Stress EKG is non diagnostic for ischemia as it is a pharmacologic stress. In addition, it showed The stress electrocardiogram showed sinus tachycardia, normal stress conduction, left ventricular hypertrophy, no stress arrhythmias and normal stress repolarization. 2. The overall quality of the study is excellent. Left ventricular cavity is noted to be normal on the rest and stress studies. Gated SPECT images reveal normal myocardial thickening and wall motion. The left ventricular ejection fraction was calculated or visually estimated to be 78%. SPECT images demonstrate small perfusion abnormality of moderate intensity in the mid anterolateral and apical lateral myocardial wall(s) on the stress images, reversible on rest images. Findings suggest small area of moderate intensity ischemia  in mid to apical anterolateral myocardium. Intermediate risk study.  Cardiac Cath 03/25/2018: Normal coronary arteries.  Lower extremity arterial duplex 12/05/2018: No hemodynamically significant stenoses are identified in the lower extremity arterial system.  This exam reveals moderately decreased perfusion of the right lower extremity, noted at the dorsalis pedis and post tibial artery level (ABI 0.68) and mildly decreased perfusion of the left lower extremity, noted at the post tibial artery level (ABI 0.83). Triphasic waveform the the major vessels suggest diffuse small vessel  disease. Left proximal SFA has < 50% stenosis.  208/06/2016, mild progression of PAD with decreased ABI bilaterally compared to left ABI 0.95 and right ABI 0.94.  Abdominal Aortic Duplex  05/27/2019: Moderate plaque noted in the proximal, mid and distal aorta.  No abdominal aortic aneurysm. Distal abdominal aortic velocity is mildly elevated suggestive of <50% stenosis. Patient has h/o distal abdominal aortic stenosis and stenting on 10/29/2017. The stent appears patent.   Carotid artery duplex  10/09/2019: Doppler flow velocities in the right internal carotid artery are consistent with stenosis in the range of 50-69% with >= 50% heterogeneous plaque. Doppler flow velocities in the left internal carotid artery are consistent with stenosis in the range of 50-69% with >= 50% heterogeneous plaque. Antegrade right vertebral artery flow. Antegrade left vertebral artery flow. Compared to the study done on 04/17/2019, this progression of disease, right ICA stenosis is new. Follow up in six months is appropriate if clinically indicated.  Echocardiogram 10/16/2019:  Normal LV systolic function with EF 60%. Left ventricle cavity is normal in size. Normal global wall motion. Normal diastolic filling pattern.  Calculated EF 60%.  Left atrial cavity is mildly dilated in 4 chamber views.  Structurally normal mitral valve. Mild (Grade I) mitral regurgitation.  Structurally normal tricuspid valve. Mild tricuspid regurgitation. No evidence of pulmonary hypertension.  Compared to 02/25/2017, no significant change.   Renal angiogram 05/10/2020: Patent Rt renal stent (placed 2015, 6.0 x 15 mm Herculink) 100% restenosis of Lt renals stent with flush occlusion 6.0 x 18 mm Herculink)  Successful angioplasty Lt renal stent Antegrade dissection in distal Lt renal artery with decreased distal flow, too distal for stenting. There is blush in left kidney.  She will be monitored for 6-8 hrs for any signs of renal  infarct.   EKG  EKG 03/24/2020: Normal sinus rhythm with rate of 58 bpm, normal axis, no evidence of ischemia.  Borderline criteria for LVH with R in V5 26 mm.   No significant change from EKG 02/10/2020.  Assessment     ICD-10-CM   1. Bilateral renal artery stenosis (HCC)  I70.1   2. History of stent insertion of renal artery  Z98.890   3. Resistant hypertension  I10   4. Myalgia due to statin  M79.10    T46.6X5A   5. PAD (peripheral artery disease) (HCC)  I73.9     No orders of the defined types were placed in this encounter.   There are no discontinued medications.  Recommendations:   Daffney Greenly  is a 59 y.o. Caucasian female  with hypertension, COPD, diastolic CHF, ongoing tobacco abuse (2 cig a day), and difficult to control hypertension with CKD, with bilateral renal artery angioplasty, PAD with distal abdominal aorta stenosis S/P stenting with 10x 29 mm Omnilink Elite stent. She underwent coronary angiogram in June 2019 for nitroglycerin responsive chest pain that revealed normal coronary arteries.   Due to worsening renal function, difficulty in controlling hypertension, underwent repeat renal arteriogram on  05/10/2020 and found to have occluded left renal artery.  She underwent a partially successful balloon angioplasty with antegrade dissection noted in the renal artery and hopefully this has remained patent.  Since then blood pressure has been very well controlled.  I suspect in view of persistent renal dysfunction, she is probably completely occluded her left renal artery with loss of function and loss of endocrine function as well.  She now has scheduled appointment to see nephrology for follow-up of stage IV chronic kidney disease.  With regard to hyperlipidemia, she is presently on high-dose high intensity Lipitor at 80 mg.  Her generalized aches and pains and severe muscle aches and muscle weakness is also probably related to atorvastatin.  I have given her a  statin holiday for 4 weeks and would like to see her back at 4 weeks to see if her symptoms improved.  If indeed she has improvement in symptoms, we could reduce atorvastatin to 20 mg along with Zetia as a combination as lipids were well controlled on the present dose of the medication.  Smoking cessation was again done with the patient.  She does have peripheral arterial disease as evidenced by absent pulses in the right lower extremity.    Adrian Prows, MD, [facility] 06/29/2020, 12:18 PM Office: [phone]

## 2020-07-11 ENCOUNTER — Other Ambulatory Visit: Payer: Self-pay | Admitting: Cardiology

## 2020-07-11 DIAGNOSIS — I10 Essential (primary) hypertension: Secondary | ICD-10-CM

## 2020-07-14 ENCOUNTER — Other Ambulatory Visit: Payer: Self-pay | Admitting: Nephrology

## 2020-07-14 ENCOUNTER — Telehealth: Payer: Self-pay | Admitting: Cardiology

## 2020-07-14 DIAGNOSIS — R42 Dizziness and giddiness: Secondary | ICD-10-CM

## 2020-07-14 DIAGNOSIS — N184 Chronic kidney disease, stage 4 (severe): Secondary | ICD-10-CM

## 2020-07-14 DIAGNOSIS — R5383 Other fatigue: Secondary | ICD-10-CM

## 2020-07-14 NOTE — Telephone Encounter (Signed)
Discussed with patient that she continues to feel dyspnea on exertion as well as fatigue as discussed at previous visit on 06/29/2020.  However she states this has gotten slightly worse in the last few days.  Over the last few days she has also experienced nausea and several episodes of emesis as well as decreased appetite.  Discussed with patient suspect she has gastroenteritis that has caused her to be dehydrated and therefore is worsening her symptoms.  Patient will follow up in the office sooner than previously scheduled.  Will see her 07/18/2020 in order to evaluate her symptoms.  Encouraged her to let the office if symptoms worsen or fail to improve.  Discussed with patient signs or symptoms that would warrant her reporting to the emergency department for further evaluation, patient expressed understanding.    ICD-10-CM   1. Dizziness  R42   2. Other fatigue  R53.83

## 2020-07-14 NOTE — Telephone Encounter (Signed)
Patient called and advised that she is having a rough 2 weeks and very tearful because she is having shortness of breath when she is walking for short periods of time and describes as worsening when she is sleeping . Patient also c/o of no energy she is unsure if this is because she has been taking off of B12 recently and off of her previous chloesterol medication . Patient is requesting a call from [place] but her appointment has been moved up as well because her concerns . Thank you

## 2020-07-16 ENCOUNTER — Other Ambulatory Visit: Payer: Self-pay | Admitting: Cardiology

## 2020-07-16 DIAGNOSIS — I1A0 Resistant hypertension: Secondary | ICD-10-CM

## 2020-07-16 DIAGNOSIS — I10 Essential (primary) hypertension: Secondary | ICD-10-CM

## 2020-07-18 ENCOUNTER — Encounter: Payer: Self-pay | Admitting: Student

## 2020-07-18 ENCOUNTER — Ambulatory Visit: Payer: Medicare Other | Admitting: Student

## 2020-07-18 ENCOUNTER — Ambulatory Visit: Payer: Medicare Other

## 2020-07-18 ENCOUNTER — Other Ambulatory Visit: Payer: Self-pay

## 2020-07-18 VITALS — BP 150/70 | HR 63 | Resp 16 | Ht 61.0 in | Wt 104.0 lb

## 2020-07-18 DIAGNOSIS — I701 Atherosclerosis of renal artery: Secondary | ICD-10-CM

## 2020-07-18 DIAGNOSIS — I1A0 Resistant hypertension: Secondary | ICD-10-CM

## 2020-07-18 DIAGNOSIS — Z9889 Other specified postprocedural states: Secondary | ICD-10-CM

## 2020-07-18 DIAGNOSIS — R0602 Shortness of breath: Secondary | ICD-10-CM

## 2020-07-18 DIAGNOSIS — I10 Essential (primary) hypertension: Secondary | ICD-10-CM

## 2020-07-18 DIAGNOSIS — E7800 Pure hypercholesterolemia, unspecified: Secondary | ICD-10-CM

## 2020-07-18 MED ORDER — AMLODIPINE BESYLATE 10 MG PO TABS: 5.0000 mg | ORAL_TABLET | Freq: Every day | ORAL | 1 refills | Status: DC

## 2020-07-18 NOTE — Progress Notes (Signed)
Primary Physician/Referring:  Bernerd Limbo, MD  [name], female    DOB: May 15, 1961, 59 y.o.   MRN: [id number]  Chief Complaint  Patient presents with  . Follow-up    4 week  . Hypertension  . Hyperlipidemia   HPI:    [name]  is a 59 y.o. Caucasian female  with hypertension, COPD, diastolic CHF, ongoing tobacco abuse (2 cig a day), and difficult to control hypertension with CKD, with bilateral renal artery angioplasty, PAD with distal abdominal aorta stenosis S/P stenting with 10x 29 mm Omnilink Elite stent. She underwent coronary angiogram in June 2019 for nitroglycerin responsive chest pain that revealed normal coronary arteries. Repeat renal angiography with balloon angioplasty 05/10/2020.  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  Patient presents for 3-week follow-up.  At last visit patient was complaining of generalized muscle aches and weakness, and therefore was given a 4-week statin holiday.  She reports no improvement in general muscle aches and weakness since stopping atorvastatin.  Complains today of shortness of breath with exertion and at rest, worse over the last 2 weeks.  She has also had one episode of chest pain at rest relieved with 1 sublingual nitroglycerin tablet.  Of note she states she has been without her albuterol and Dulera inhalers for approximately 1 month.  Unfortunately she does continue to smoke approximately 2 cigarettes daily.  Patient was seen by nephrology for the first time last week and her hydralazine was increased from 25 mg to 50 mg 3 times daily.  Past Medical History:  Diagnosis Date  . Anemia 2008  . Anxiety   . Aortic stenosis   . Arthritis    "all my joints ache" (09/07/2014)  . Asthma   . Bipolar 1 disorder ([place])   . Bradycardia   . Bruit   . Carpal tunnel syndrome   . Cervical cancer ([place]) 1985  . CHF (congestive heart failure) ([place])   .  Chronic kidney disease (CKD), stage V ([place])   . COPD (chronic obstructive pulmonary disease) ([place]) 2000  . Coronary artery disease   . Daily headache   . Depression 2000  . Diverticulitis 2008  . GERD (gastroesophageal reflux disease)   . Glaucoma   . Heart murmur   . History of colon polyps 2009  . HLD (hyperlipidemia) 2013  . Hypertension 2013  . Hypovitaminosis D   . IBS (irritable bowel syndrome) 2008  . Myocardial infarction ([place])    2015  . PAD (peripheral artery disease) ([place])   . Pancreatitis 10/2011  . Pneumonia 07/2014  . RLS (restless legs syndrome)   . Schizophrenia ([place])   . Shortness of breath   . Skin cancer   . Small bowel obstruction ([place]) 2008   Past Surgical History:  Procedure Laterality Date  . ABDOMINAL ANGIOGRAM N/A 09/07/2014   Procedure: ABDOMINAL ANGIOGRAM;  Surgeon: Laverda Page, MD;  Location: [facility] CATH LAB;  Service: Cardiovascular;  Laterality: N/A;  . APPENDECTOMY  1996  . COLON SURGERY  2008   6 inches of colon removed due to obstruction  . COLONOSCOPY WITH PROPOFOL N/A 10/25/2016   Procedure: COLONOSCOPY WITH PROPOFOL;  Surgeon: Milus Banister, MD;  Location: [facility] ENDOSCOPY;  Service: Endoscopy;  Laterality: N/A;  . CORONARY ANGIOPLASTY WITH STENT PLACEMENT  09/07/2014   "2"  . LEFT HEART CATH AND CORONARY ANGIOGRAPHY N/A 03/25/2018   Procedure: LEFT HEART CATH AND CORONARY ANGIOGRAPHY;  Surgeon:  Patwardhan, Reynold Bowen, MD;  Location: [place] CV LAB;  Service: Cardiovascular;  Laterality: N/A;  . LEFT HEART CATHETERIZATION WITH CORONARY ANGIOGRAM N/A 09/07/2014   Procedure: LEFT HEART CATHETERIZATION WITH CORONARY ANGIOGRAM;  Surgeon: Laverda Page, MD;  Location: [facility] CATH LAB;  Service: Cardiovascular;  Laterality: N/A;  . LOWER EXTREMITY ANGIOGRAPHY  10/29/2017   Procedure: Lower Extremity Angiography;  Surgeon: Adrian Prows, MD;  Location: [place] CV LAB;  Service: Cardiovascular;;  . PERIPHERAL VASCULAR CATHETERIZATION N/A 11/01/2015    Procedure: Renal Angiography;  Surgeon: Adrian Prows, MD;  Location: [place] CV LAB;  Service: Cardiovascular;  Laterality: N/A;  . PERIPHERAL VASCULAR CATHETERIZATION N/A 04/10/2016   Procedure: Renal Angiography;  Surgeon: Adrian Prows, MD;  Location: [place] CV LAB;  Service: Cardiovascular;  Laterality: N/A;  . PERIPHERAL VASCULAR CATHETERIZATION  04/10/2016   Procedure: Peripheral Vascular Intervention;  Surgeon: Adrian Prows, MD;  Location: [place] CV LAB;  Service: Cardiovascular;;  . PERIPHERAL VASCULAR INTERVENTION  10/29/2017   Procedure: PERIPHERAL VASCULAR INTERVENTION;  Surgeon: Adrian Prows, MD;  Location: [place] CV LAB;  Service: Cardiovascular;;  . RENAL ANGIOGRAPHY N/A 10/29/2017   Procedure: RENAL ANGIOGRAPHY;  Surgeon: Adrian Prows, MD;  Location: [place] CV LAB;  Service: Cardiovascular;  Laterality: N/A;  . RENAL ANGIOGRAPHY N/A 05/10/2020   Procedure: RENAL ANGIOGRAPHY;  Surgeon: Nigel Mormon, MD;  Location: [place] CV LAB;  Service: Cardiovascular;  Laterality: N/A;  . RIGHT OOPHORECTOMY Right 1996  . TOTAL ABDOMINAL HYSTERECTOMY  1997   Family History  Problem Relation Age of Onset  . Other Mother        many bowel obstructions  . Heart disease Mother   . Colon polyps Mother   . Kidney cancer Father   . Bone cancer Father   . Diabetes Father   . Heart disease Father   . Diabetes Daughter   . Colon cancer Paternal Grandfather   . Heart disease Brother   . Esophageal cancer Neg Hx     Social History   Tobacco Use  . Smoking status: Current Every Day Smoker    Packs/day: 0.25    Years: 40.00    Pack years: 10.00    Types: Cigarettes  . Smokeless tobacco: Never Used  Substance Use Topics  . Alcohol use: No   Marital Status: Divorced  ROS  Review of Systems  Cardiovascular: Positive for chest pain and dyspnea on exertion. Negative for claudication and leg swelling.  Musculoskeletal: Positive for muscle cramps and muscle weakness  (diffuse). Negative for joint swelling.  Neurological: Positive for dizziness.  Psychiatric/Behavioral: Positive for depression.   Objective  Blood pressure (!) 150/70, pulse 63, resp. rate 16, height '5\' 1"'  (1.549 m), weight 104 lb (47.2 kg), SpO2 91 %.  Vitals with BMI 07/18/2020 06/29/2020 06/29/2020  Height '5\' 1"'  - '5\' 1"'   Weight 104 lbs - 106 lbs  BMI 75.10 - 25.85  Systolic 277 824 235  Diastolic 70 46 33  Pulse 63 60 60     Physical Exam Constitutional:      General: She is not in acute distress.    Appearance: She is well-developed. She is not toxic-appearing.     Comments: Patient is tearful throughout encounter.  Cardiovascular:     Rate and Rhythm: Normal rate and regular rhythm.     Pulses:          Carotid pulses are on the right side with bruit and on the left side  with bruit.      Femoral pulses are 2+ on the right side and 2+ on the left side.      Popliteal pulses are 2+ on the right side and 2+ on the left side.       Dorsalis pedis pulses are 0 on the right side and 2+ on the left side.       Posterior tibial pulses are 0 on the right side and 2+ on the left side.     Heart sounds: Murmur heard.  Early systolic murmur is present with a grade of 2/6 at the upper right sternal border and upper left sternal border.  No gallop.      Comments: No leg edema. No JVD.   Pulmonary:     Effort: Pulmonary effort is normal. No accessory muscle usage or respiratory distress.     Breath sounds: Examination of the right-lower field reveals decreased breath sounds. Examination of the left-lower field reveals decreased breath sounds. Decreased breath sounds and wheezing (Bilaterally thoughout, worse in upper lung fields) present.  Abdominal:     General: Bowel sounds are normal.     Palpations: Abdomen is soft.    Laboratory examination:   CMP Latest Ref Rng & Units 06/24/2020 05/11/2020 05/10/2020  Glucose 65 - 99 mg/dL 116(H) 104(H) 108(H)  BUN 6 - 24 mg/dL 27(H) 20 15    Creatinine 0.57 - 1.00 mg/dL 2.41(H) 2.09(H) 1.92(H)  Sodium 134 - 144 mmol/L 141 137 138  Potassium 3.5 - 5.2 mmol/L 5.5(H) 4.8 4.2  Chloride 96 - 106 mmol/L 108(H) 102 101  CO2 20 - 29 mmol/L 18(L) 26 23  Calcium 8.7 - 10.2 mg/dL 9.0 9.5 9.7  Total Protein 6.5 - 8.1 g/dL - - -  Total Bilirubin 0.3 - 1.2 mg/dL - - -  Alkaline Phos 38 - 126 U/L - - -  AST 15 - 41 U/L - - -  ALT 14 - 54 U/L - - -   CBC Latest Ref Rng & Units 05/10/2020 03/31/2020 06/05/2017  WBC 4.0 - 10.5 K/uL 9.4 6.9 7.0  Hemoglobin 12.0 - 15.0 g/dL 12.3 10.8(L) 11.6(L)  Hematocrit 36 - 46 % 39.9 35.1 35.9(L)  Platelets 150 - 400 K/uL 386 405 402(H)   Lipid Panel Recent Labs    10/14/19 1408 03/31/20 1336  CHOL 134 119  TRIG 81 92  LDLCALC 64 51  HDL 54 50  CHOLHDL 2.5  --     HEMOGLOBIN A1C No results found for: HGBA1C, MPG TSH No results for input(s): TSH in the last 8760 hours.   External labs:   Lab 03/01/2020:  Hb 10.9/HCT 34.4, normal indicis, platelet counts 376.  Serum glucose 100 mg, BUN 21, creatinine 2.59, EGFR 20 mL.  Sodium 140, potassium 5.9.  LFTs normal.  Medications and allergies   Allergies  Allergen Reactions  . Doxycycline Anaphylaxis and Hives  . Hydrocodone-Acetaminophen Nausea And Vomiting  . Iohexol Itching    Pt has itching nose after iv contrast injection    Outpatient Medications Prior to Visit  Medication Sig Dispense Refill  . acetaminophen (TYLENOL) 500 MG tablet Take 1,000 mg by mouth every 8 (eight) hours as needed for mild pain or headache.    . albuterol (PROVENTIL HFA;VENTOLIN HFA) 108 (90 BASE) MCG/ACT inhaler Inhale 2 puffs into the lungs 2 (two) times daily as needed for wheezing or shortness of breath (asthma).     . ALPRAZolam (XANAX) 1 MG tablet Take 1 tablet by mouth  2 (two) times daily as needed for anxiety.     Marland Kitchen aspirin EC 81 MG tablet Take 1 tablet (81 mg total) by mouth daily. 30 tablet 2  . atenolol (TENORMIN) 25 MG tablet Take 1 tablet (25 mg  total) by mouth 2 (two) times daily. (Patient taking differently: Take 25 mg by mouth daily. ) 180 tablet 1  . azelastine (ASTELIN) 0.1 % nasal spray Place 1 spray into both nostrils daily as needed for rhinitis.     Marland Kitchen clopidogrel (PLAVIX) 75 MG tablet Take 1 tablet (75 mg total) by mouth daily. 30 tablet 2  . ezetimibe (ZETIA) 10 MG tablet Take 1 tablet (10 mg total) by mouth daily. 90 tablet 3  . gabapentin (NEURONTIN) 400 MG capsule Take 1 capsule (400 mg total) by mouth 2 (two) times daily. Patient takes 400 mg 3 times daily and 1200 mg at bedtime (Patient taking differently: Take 800 mg by mouth at bedtime. )    . hydrALAZINE (APRESOLINE) 25 MG tablet Take 1 tablet (25 mg total) by mouth 3 (three) times daily. (Patient taking differently: Take 50 mg by mouth 3 (three) times daily. ) 90 tablet 2  . mometasone-formoterol (DULERA) 100-5 MCG/ACT AERO Take 2 puffs first thing in am and then another 2 puffs about 12 hours later. (Patient taking differently: Inhale 2 puffs into the lungs daily. ) 1 Inhaler 11  . nitroGLYCERIN (NITROSTAT) 0.4 MG SL tablet Place 1 tablet (0.4 mg total) under the tongue every 5 (five) minutes as needed for chest pain. 30 tablet 1  . pantoprazole (PROTONIX) 40 MG tablet TAKE 1 TABLET BY MOUTH TWICE (2) DAILY 180 tablet 2  . traZODone (DESYREL) 100 MG tablet Take 100 mg by mouth at bedtime as needed for sleep.     Marland Kitchen venlafaxine XR (EFFEXOR XR) 150 MG 24 hr capsule Take 1 capsule (150 mg total) by mouth daily with breakfast. (Patient taking differently: Take 300 mg by mouth daily with breakfast. ) 30 capsule 0  . amLODipine (NORVASC) 10 MG tablet TAKE 1 TABLET BY MOUTH ONCE A DAY 30 tablet 1  . isosorbide mononitrate (IMDUR) 60 MG 24 hr tablet Take 60 mg by mouth daily.    Marland Kitchen atorvastatin (LIPITOR) 80 MG tablet Take 80 mg by mouth daily. Hold for 4  Weeks starting 06/29/2020 (Patient not taking: Reported on 07/18/2020)     No facility-administered medications prior to visit.    Radiology:   No results found.  Cardiac Studies:   Renal arteriogram 02/29/2018: Widely patient stents. 04/10/2016: Scoring balloon angioplasty left in-stent restenosis with 6 x 20 mm angiosculpt balloon.  Right renal artery stent widely patent. H/O bilateral renal stenting on 09/07/2014: Right renal artery 6.0 x 15 mm Herculink widely patent, left renal artery  6.0 x 18 mm Herculink stent.  Renal artery duplex 09/18/2017: Hemodynamically significant stenosis of the right renal artery. Peak Velocity of 308/66 cm/s. Normal intrarenal vascular perfusion is noted in both kidneys. There is increased echogenecity of both kidneys suggests medico-renal disease with right kidney slightly shrunk at 8.03x3.97x4 cm compared to 9.144.664.6 cm Diffuse plaque noted in the abdominal aorta with calcification.  Compared to the study done on 01/22/2017, left renal artery stenosis not evident, right renal artery stenosis new.  Patient has h/o bilateral renal artery stenting.  Abdominal aortogram 10/29/2017: Widely patent renal arteries. Distal abdominal aorta diffuse disease, calcification with 80% stenosis S/P 10 x 29 mm Omnilink Elite stent, 80% to 0%.  Lexiscan myoview stress  test 02/17/2018:  1. Lexiscan stress test was performed. Exercise capacity was not assessed. Stress symptoms included dizziness, nausea, headache, and chest pressure.. Peak blood pressure was 176/88 mmHg. Stress EKG is non diagnostic for ischemia as it is a pharmacologic stress. In addition, it showed The stress electrocardiogram showed sinus tachycardia, normal stress conduction, left ventricular hypertrophy, no stress arrhythmias and normal stress repolarization. 2. The overall quality of the study is excellent. Left ventricular cavity is noted to be normal on the rest and stress studies. Gated SPECT images reveal normal myocardial thickening and wall motion. The left ventricular ejection fraction was calculated or visually estimated  to be 78%. SPECT images demonstrate small perfusion abnormality of moderate intensity in the mid anterolateral and apical lateral myocardial wall(s) on the stress images, reversible on rest images. Findings suggest small area of moderate intensity ischemia in mid to apical anterolateral myocardium. Intermediate risk study.  Cardiac Cath 03/25/2018: Normal coronary arteries.  Lower extremity arterial duplex 12/05/2018: No hemodynamically significant stenoses are identified in the lower extremity arterial system.  This exam reveals moderately decreased perfusion of the right lower extremity, noted at the dorsalis pedis and post tibial artery level (ABI 0.68) and mildly decreased perfusion of the left lower extremity, noted at the post tibial artery level (ABI 0.83). Triphasic waveform the the major vessels suggest diffuse small vessel disease. Left proximal SFA has < 50% stenosis.  208/06/2016, mild progression of PAD with decreased ABI bilaterally compared to left ABI 0.95 and right ABI 0.94.  Abdominal Aortic Duplex  05/27/2019: Moderate plaque noted in the proximal, mid and distal aorta.  No abdominal aortic aneurysm. Distal abdominal aortic velocity is mildly elevated suggestive of <50% stenosis. Patient has h/o distal abdominal aortic stenosis and stenting on 10/29/2017. The stent appears patent.   Carotid artery duplex  10/09/2019: Doppler flow velocities in the right internal carotid artery are consistent with stenosis in the range of 50-69% with >= 50% heterogeneous plaque. Doppler flow velocities in the left internal carotid artery are consistent with stenosis in the range of 50-69% with >= 50% heterogeneous plaque. Antegrade right vertebral artery flow. Antegrade left vertebral artery flow. Compared to the study done on 04/17/2019, this progression of disease, right ICA stenosis is new. Follow up in six months is appropriate if clinically indicated.  Echocardiogram 10/16/2019:  Normal LV  systolic function with EF 60%. Left ventricle cavity is normal in size. Normal global wall motion. Normal diastolic filling pattern.  Calculated EF 60%.  Left atrial cavity is mildly dilated in 4 chamber views.  Structurally normal mitral valve. Mild (Grade I) mitral regurgitation.  Structurally normal tricuspid valve. Mild tricuspid regurgitation. No evidence of pulmonary hypertension.  Compared to 02/25/2017, no significant change.   Renal angiogram 05/10/2020: Patent Rt renal stent (placed 2015, 6.0 x 15 mm Herculink) 100% restenosis of Lt renals stent with flush occlusion 6.0 x 18 mm Herculink)  Successful angioplasty Lt renal stent Antegrade dissection in distal Lt renal artery with decreased distal flow, too distal for stenting. There is blush in left kidney.  She will be monitored for 6-8 hrs for any signs of renal infarct.   Renal artery duplex 06/15/2020:  No evidence of renal artery occlusive disease in either renal artery.  Mild increase in right vascular resistivity index. Normal intrarenal vascular perfusion is noted in the left kidney.  Diffuse plaque noted in the abdominal aorta. Clinical correlation is suggested.  Study suggests patent renal artery stents.  EKG  EKG 03/24/2020: Normal sinus  rhythm with rate of 58 bpm, normal axis, no evidence of ischemia.  Borderline criteria for LVH with R in V5 26 mm.   No significant change from EKG 02/10/2020.  Assessment     ICD-10-CM   1. Shortness of breath  R06.02 Brain natriuretic peptide  2. Resistant hypertension  I10 amLODipine (NORVASC) 10 MG tablet  3. Pure hypercholesterolemia  E78.00   4. Bilateral renal artery stenosis (HCC)  I70.1   5. History of stent insertion of renal artery  Z98.890     Meds ordered this encounter  Medications  . amLODipine (NORVASC) 10 MG tablet    Sig: Take 0.5 tablets (5 mg total) by mouth daily.    Dispense:  30 tablet    Refill:  1    Medications Discontinued During This  Encounter  Medication Reason  . amLODipine (NORVASC) 10 MG tablet     Recommendations:   Rafeef Lau  is a 59 y.o. Caucasian female  with hypertension, COPD, diastolic CHF, ongoing tobacco abuse (2 cig a day), and difficult to control hypertension with CKD, with bilateral renal artery angioplasty, PAD with distal abdominal aorta stenosis S/P stenting with 10x 29 mm Omnilink Elite stent. She underwent coronary angiogram in June 2019 for nitroglycerin responsive chest pain that revealed normal coronary arteries.  Repeat renal angiography with balloon angioplasty 05/10/2020.  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  Patient presents today with complaints of shortness of breath both with exertion and at rest worse from baseline for the last 2 weeks.  At baseline she does have chronic dyspnea.  Exam revealed diffuse bilateral wheezing, worse in the upper lung fields, without crackles or rales at the bases of the lungs.  Exam today without clinical signs of acute heart failure.  Suspect shortness of breath to be related to underlying COPD exacerbation as patient has been without inhalers for approximately 1 month.  Suggest she follow-up with primary care provider, as well as refill inhalers as soon as possible.  As patient does have history of diastolic heart failure will obtain BNP to evaluate potential cardiac etiology of shortness of breath.  If symptoms fail to improve with proper COPD treatment, could consider further cardiac evaluation in the future including echocardiogram.  Patient's blood pressure is also elevated today.  She was recently seen by nephrology for the first time and hydralazine was increased from 25 mg to 50 mg 3 times daily, however patient has not increased this dose yet.  Encourage patient to continue taking antihypertensive medications as directed and to increase hydralazine as directed by nephrology.  Elevated blood  pressure may also be contributing to both shortness of breath and potential anginal symptoms.  Encourage patient to continue to monitor blood pressure.  Discussed with patient results of renal duplex 06/15/2020 which revealed patent renal artery stenosis.  However also noted diffuse abdominal aortic plaque.  Reiterated to patient importance of smoking cessation as well as medication, and diet and lifestyle compliance.  As patient's generalized muscle weakness and muscle aches have not improved since stopping atorvastatin suspect these are not related to her statin use.  However at this time patient wishes to continue with a statin holiday until follow-up in our office.  Will plan to restart atorvastatin 80 mg daily at next visit if patient still does not have improvement in myalgia.  Of note patient was very tearful and anxious throughout today's visit, I gave reassurance to patient.   Follow-up in  2 weeks for reevaluation of symptoms, hypertension.    Alethia Berthold, PA-C 07/18/2020, 12:12 PM Office: [phone]

## 2020-07-18 NOTE — Telephone Encounter (Signed)
Patient is being seen today

## 2020-07-20 ENCOUNTER — Other Ambulatory Visit: Payer: Self-pay

## 2020-07-20 ENCOUNTER — Ambulatory Visit
Admission: RE | Admit: 2020-07-20 | Discharge: 2020-07-20 | Disposition: A | Discharge status: Home / Self Care | Payer: Medicare Other | Source: Ambulatory Visit | Attending: Nephrology | Admitting: Nephrology

## 2020-07-20 DIAGNOSIS — N184 Chronic kidney disease, stage 4 (severe): Secondary | ICD-10-CM | POA: Insufficient documentation

## 2020-07-27 ENCOUNTER — Ambulatory Visit: Payer: Medicare Other | Admitting: Student

## 2020-07-29 ENCOUNTER — Telehealth: Payer: Self-pay

## 2020-07-29 NOTE — Telephone Encounter (Signed)
Error

## 2020-07-29 NOTE — Progress Notes (Deleted)
Primary Physician/Referring:  Bernerd Limbo, MD  [name], female    DOB: July 13, 1961, 59 y.o.   MRN: [id number]  No chief complaint on file.  HPI:    [name]  is a 59 y.o. Caucasian female  with hypertension, COPD, diastolic CHF, ongoing tobacco abuse (2 cig a day), and difficult to control hypertension with CKD, with bilateral renal artery angioplasty, PAD with distal abdominal aorta stenosis S/P stenting with 10x 29 mm Omnilink Elite stent. She underwent coronary angiogram in June 2019 for nitroglycerin responsive chest pain that revealed normal coronary arteries. Repeat renal angiography with balloon angioplasty 05/10/2020.  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  Patient presents for 3-week follow-up.  At last visit patient was complaining of generalized muscle aches and weakness, and therefore was given a 4-week statin holiday.  She reports no improvement in general muscle aches and weakness since stopping atorvastatin.  Complains today of shortness of breath with exertion and at rest, worse over the last 2 weeks.  She has also had one episode of chest pain at rest relieved with 1 sublingual nitroglycerin tablet.  Of note she states she has been without her albuterol and Dulera inhalers for approximately 1 month.  Unfortunately she does continue to smoke approximately 2 cigarettes daily.  Patient was seen by nephrology for the first time last week and her hydralazine was increased from 25 mg to 50 mg 3 times daily.  ***Increase hydralazine to 50 mg three times daily? Anginal symptoms, shortness of breath, smoking cessation, muscle weakness?  Past Medical History:  Diagnosis Date  . Anemia 2008  . Anxiety   . Aortic stenosis   . Arthritis    "all my joints ache" (09/07/2014)  . Asthma   . Bipolar 1 disorder ([place])   . Bradycardia   . Bruit   . Carpal tunnel syndrome   . Cervical cancer  ([place]) 1985  . CHF (congestive heart failure) ([place])   . Chronic kidney disease (CKD), stage V ([place])   . COPD (chronic obstructive pulmonary disease) ([place]) 2000  . Coronary artery disease   . Daily headache   . Depression 2000  . Diverticulitis 2008  . GERD (gastroesophageal reflux disease)   . Glaucoma   . Heart murmur   . History of colon polyps 2009  . HLD (hyperlipidemia) 2013  . Hypertension 2013  . Hypovitaminosis D   . IBS (irritable bowel syndrome) 2008  . Myocardial infarction ([place])    2015  . PAD (peripheral artery disease) ([place])   . Pancreatitis 10/2011  . Pneumonia 07/2014  . RLS (restless legs syndrome)   . Schizophrenia ([place])   . Shortness of breath   . Skin cancer   . Small bowel obstruction ([place]) 2008   Past Surgical History:  Procedure Laterality Date  . ABDOMINAL ANGIOGRAM N/A 09/07/2014   Procedure: ABDOMINAL ANGIOGRAM;  Surgeon: Laverda Page, MD;  Location: [facility] CATH LAB;  Service: Cardiovascular;  Laterality: N/A;  . APPENDECTOMY  1996  . COLON SURGERY  2008   6 inches of colon removed due to obstruction  . COLONOSCOPY WITH PROPOFOL N/A 10/25/2016   Procedure: COLONOSCOPY WITH PROPOFOL;  Surgeon: Milus Banister, MD;  Location: [facility] ENDOSCOPY;  Service: Endoscopy;  Laterality: N/A;  . CORONARY ANGIOPLASTY WITH STENT PLACEMENT  09/07/2014   "2"  . LEFT HEART CATH AND CORONARY ANGIOGRAPHY N/A 03/25/2018   Procedure: LEFT HEART CATH AND CORONARY ANGIOGRAPHY;  Surgeon: Nigel Mormon, MD;  Location: [place] CV LAB;  Service: Cardiovascular;  Laterality: N/A;  . LEFT HEART CATHETERIZATION WITH CORONARY ANGIOGRAM N/A 09/07/2014   Procedure: LEFT HEART CATHETERIZATION WITH CORONARY ANGIOGRAM;  Surgeon: Laverda Page, MD;  Location: [facility] CATH LAB;  Service: Cardiovascular;  Laterality: N/A;  . LOWER EXTREMITY ANGIOGRAPHY  10/29/2017   Procedure: Lower Extremity Angiography;  Surgeon: Adrian Prows, MD;  Location: [place] CV LAB;  Service: Cardiovascular;;    . PERIPHERAL VASCULAR CATHETERIZATION N/A 11/01/2015   Procedure: Renal Angiography;  Surgeon: Adrian Prows, MD;  Location: [place] CV LAB;  Service: Cardiovascular;  Laterality: N/A;  . PERIPHERAL VASCULAR CATHETERIZATION N/A 04/10/2016   Procedure: Renal Angiography;  Surgeon: Adrian Prows, MD;  Location: [place] CV LAB;  Service: Cardiovascular;  Laterality: N/A;  . PERIPHERAL VASCULAR CATHETERIZATION  04/10/2016   Procedure: Peripheral Vascular Intervention;  Surgeon: Adrian Prows, MD;  Location: [place] CV LAB;  Service: Cardiovascular;;  . PERIPHERAL VASCULAR INTERVENTION  10/29/2017   Procedure: PERIPHERAL VASCULAR INTERVENTION;  Surgeon: Adrian Prows, MD;  Location: [place] CV LAB;  Service: Cardiovascular;;  . RENAL ANGIOGRAPHY N/A 10/29/2017   Procedure: RENAL ANGIOGRAPHY;  Surgeon: Adrian Prows, MD;  Location: [place] CV LAB;  Service: Cardiovascular;  Laterality: N/A;  . RENAL ANGIOGRAPHY N/A 05/10/2020   Procedure: RENAL ANGIOGRAPHY;  Surgeon: Nigel Mormon, MD;  Location: [place] CV LAB;  Service: Cardiovascular;  Laterality: N/A;  . RIGHT OOPHORECTOMY Right 1996  . TOTAL ABDOMINAL HYSTERECTOMY  1997   Family History  Problem Relation Age of Onset  . Other Mother        many bowel obstructions  . Heart disease Mother   . Colon polyps Mother   . Kidney cancer Father   . Bone cancer Father   . Diabetes Father   . Heart disease Father   . Diabetes Daughter   . Colon cancer Paternal Grandfather   . Heart disease Brother   . Esophageal cancer Neg Hx     Social History   Tobacco Use  . Smoking status: Current Every Day Smoker    Packs/day: 0.25    Years: 40.00    Pack years: 10.00    Types: Cigarettes  . Smokeless tobacco: Never Used  Substance Use Topics  . Alcohol use: No   Marital Status: Divorced  ROS  Review of Systems  Cardiovascular: Positive for chest pain and dyspnea on exertion. Negative for claudication and leg swelling.  Musculoskeletal:  Positive for muscle cramps and muscle weakness (diffuse). Negative for joint swelling.  Neurological: Positive for dizziness.  Psychiatric/Behavioral: Positive for depression.   Objective  There were no vitals taken for this visit.  Vitals with BMI 07/18/2020 06/29/2020 06/29/2020  Height _0  - _1   Weight 104 lbs - 106 lbs  BMI 63.87 - 56.43  Systolic 329 518 841  Diastolic 70 46 33  Pulse 63 60 60     Physical Exam Constitutional:      General: She is not in acute distress.    Appearance: She is well-developed. She is not toxic-appearing.     Comments: Patient is tearful throughout encounter.  Cardiovascular:     Rate and Rhythm: Normal rate and regular rhythm.     Pulses:          Carotid pulses are on the right side with bruit and on the left side with bruit.      Femoral pulses are 2+ on  the right side and 2+ on the left side.      Popliteal pulses are 2+ on the right side and 2+ on the left side.       Dorsalis pedis pulses are 0 on the right side and 2+ on the left side.       Posterior tibial pulses are 0 on the right side and 2+ on the left side.     Heart sounds: Murmur heard.  Early systolic murmur is present with a grade of 2/6 at the upper right sternal border and upper left sternal border.  No gallop.      Comments: No leg edema. No JVD.   Pulmonary:     Effort: Pulmonary effort is normal. No accessory muscle usage or respiratory distress.     Breath sounds: Examination of the right-lower field reveals decreased breath sounds. Examination of the left-lower field reveals decreased breath sounds. Decreased breath sounds and wheezing (Bilaterally thoughout, worse in upper lung fields) present.  Abdominal:     General: Bowel sounds are normal.     Palpations: Abdomen is soft.    Laboratory examination:   CMP Latest Ref Rng & Units 06/24/2020 05/11/2020 05/10/2020  Glucose 65 - 99 mg/dL 116(H) 104(H) 108(H)  BUN 6 - 24 mg/dL 27(H) 20 15  Creatinine 0.57 - 1.00  mg/dL 2.41(H) 2.09(H) 1.92(H)  Sodium 134 - 144 mmol/L 141 137 138  Potassium 3.5 - 5.2 mmol/L 5.5(H) 4.8 4.2  Chloride 96 - 106 mmol/L 108(H) 102 101  CO2 20 - 29 mmol/L 18(L) 26 23  Calcium 8.7 - 10.2 mg/dL 9.0 9.5 9.7  Total Protein 6.5 - 8.1 g/dL - - -  Total Bilirubin 0.3 - 1.2 mg/dL - - -  Alkaline Phos 38 - 126 U/L - - -  AST 15 - 41 U/L - - -  ALT 14 - 54 U/L - - -   CBC Latest Ref Rng & Units 05/10/2020 03/31/2020 06/05/2017  WBC 4.0 - 10.5 K/uL 9.4 6.9 7.0  Hemoglobin 12.0 - 15.0 g/dL 12.3 10.8(L) 11.6(L)  Hematocrit 36 - 46 % 39.9 35.1 35.9(L)  Platelets 150 - 400 K/uL 386 405 402(H)   Lipid Panel Recent Labs    10/14/19 1408 03/31/20 1336  CHOL 134 119  TRIG 81 92  LDLCALC 64 51  HDL 54 50  CHOLHDL 2.5  --     HEMOGLOBIN A1C No results found for: HGBA1C, MPG TSH No results for input(s): TSH in the last 8760 hours.   External labs:   Lab 03/01/2020:  Hb 10.9/HCT 34.4, normal indicis, platelet counts 376.  Serum glucose 100 mg, BUN 21, creatinine 2.59, EGFR 20 mL.  Sodium 140, potassium 5.9.  LFTs normal.  Medications and allergies   Allergies  Allergen Reactions  . Doxycycline Anaphylaxis and Hives  . Hydrocodone-Acetaminophen Nausea And Vomiting  . Iohexol Itching    Pt has itching nose after iv contrast injection    Outpatient Medications Prior to Visit  Medication Sig Dispense Refill  . acetaminophen (TYLENOL) 500 MG tablet Take 1,000 mg by mouth every 8 (eight) hours as needed for mild pain or headache.    . albuterol (PROVENTIL HFA;VENTOLIN HFA) 108 (90 BASE) MCG/ACT inhaler Inhale 2 puffs into the lungs 2 (two) times daily as needed for wheezing or shortness of breath (asthma).     . ALPRAZolam (XANAX) 1 MG tablet Take 1 tablet by mouth 2 (two) times daily as needed for anxiety.     Marland Kitchen  amLODipine (NORVASC) 10 MG tablet Take 0.5 tablets (5 mg total) by mouth daily. 30 tablet 1  . aspirin EC 81 MG tablet Take 1 tablet (81 mg total) by mouth daily. 30  tablet 2  . atenolol (TENORMIN) 25 MG tablet Take 1 tablet (25 mg total) by mouth 2 (two) times daily. (Patient taking differently: Take 25 mg by mouth daily. ) 180 tablet 1  . atorvastatin (LIPITOR) 80 MG tablet Take 80 mg by mouth daily. Hold for 4  Weeks starting 06/29/2020 (Patient not taking: Reported on 07/18/2020)    . azelastine (ASTELIN) 0.1 % nasal spray Place 1 spray into both nostrils daily as needed for rhinitis.     Marland Kitchen clopidogrel (PLAVIX) 75 MG tablet Take 1 tablet (75 mg total) by mouth daily. 30 tablet 2  . ezetimibe (ZETIA) 10 MG tablet Take 1 tablet (10 mg total) by mouth daily. 90 tablet 3  . gabapentin (NEURONTIN) 400 MG capsule Take 1 capsule (400 mg total) by mouth 2 (two) times daily. Patient takes 400 mg 3 times daily and 1200 mg at bedtime (Patient taking differently: Take 800 mg by mouth at bedtime. )    . hydrALAZINE (APRESOLINE) 25 MG tablet Take 1 tablet (25 mg total) by mouth 3 (three) times daily. (Patient taking differently: Take 50 mg by mouth 3 (three) times daily. ) 90 tablet 2  . isosorbide mononitrate (IMDUR) 60 MG 24 hr tablet TAKE 1 TABLET BY MOUTH DAILY 90 tablet 3  . mometasone-formoterol (DULERA) 100-5 MCG/ACT AERO Take 2 puffs first thing in am and then another 2 puffs about 12 hours later. (Patient taking differently: Inhale 2 puffs into the lungs daily. ) 1 Inhaler 11  . nitroGLYCERIN (NITROSTAT) 0.4 MG SL tablet Place 1 tablet (0.4 mg total) under the tongue every 5 (five) minutes as needed for chest pain. 30 tablet 1  . pantoprazole (PROTONIX) 40 MG tablet TAKE 1 TABLET BY MOUTH TWICE (2) DAILY 180 tablet 2  . traZODone (DESYREL) 100 MG tablet Take 100 mg by mouth at bedtime as needed for sleep.     Marland Kitchen venlafaxine XR (EFFEXOR XR) 150 MG 24 hr capsule Take 1 capsule (150 mg total) by mouth daily with breakfast. (Patient taking differently: Take 300 mg by mouth daily with breakfast. ) 30 capsule 0   No facility-administered medications prior to visit.    Radiology:   No results found.  Cardiac Studies:   Renal arteriogram 02/29/2018: Widely patient stents. 04/10/2016: Scoring balloon angioplasty left in-stent restenosis with 6 x 20 mm angiosculpt balloon.  Right renal artery stent widely patent. H/O bilateral renal stenting on 09/07/2014: Right renal artery 6.0 x 15 mm Herculink widely patent, left renal artery  6.0 x 18 mm Herculink stent.  Renal artery duplex 09/18/2017: Hemodynamically significant stenosis of the right renal artery. Peak Velocity of 308/66 cm/s. Normal intrarenal vascular perfusion is noted in both kidneys. There is increased echogenecity of both kidneys suggests medico-renal disease with right kidney slightly shrunk at 8.03x3.97x4 cm compared to 9.144.664.6 cm Diffuse plaque noted in the abdominal aorta with calcification.  Compared to the study done on 01/22/2017, left renal artery stenosis not evident, right renal artery stenosis new.  Patient has h/o bilateral renal artery stenting.  Abdominal aortogram 10/29/2017: Widely patent renal arteries. Distal abdominal aorta diffuse disease, calcification with 80% stenosis S/P 10 x 29 mm Omnilink Elite stent, 80% to 0%.  Lexiscan myoview stress test 02/17/2018:  1. Lexiscan stress test was performed. Exercise capacity  was not assessed. Stress symptoms included dizziness, nausea, headache, and chest pressure.. Peak blood pressure was 176/88 mmHg. Stress EKG is non diagnostic for ischemia as it is a pharmacologic stress. In addition, it showed The stress electrocardiogram showed sinus tachycardia, normal stress conduction, left ventricular hypertrophy, no stress arrhythmias and normal stress repolarization. 2. The overall quality of the study is excellent. Left ventricular cavity is noted to be normal on the rest and stress studies. Gated SPECT images reveal normal myocardial thickening and wall motion. The left ventricular ejection fraction was calculated or visually estimated  to be 78%. SPECT images demonstrate small perfusion abnormality of moderate intensity in the mid anterolateral and apical lateral myocardial wall(s) on the stress images, reversible on rest images. Findings suggest small area of moderate intensity ischemia in mid to apical anterolateral myocardium. Intermediate risk study.  Cardiac Cath 03/25/2018: Normal coronary arteries.  Lower extremity arterial duplex 12/05/2018: No hemodynamically significant stenoses are identified in the lower extremity arterial system.  This exam reveals moderately decreased perfusion of the right lower extremity, noted at the dorsalis pedis and post tibial artery level (ABI 0.68) and mildly decreased perfusion of the left lower extremity, noted at the post tibial artery level (ABI 0.83). Triphasic waveform the the major vessels suggest diffuse small vessel disease. Left proximal SFA has < 50% stenosis.  208/06/2016, mild progression of PAD with decreased ABI bilaterally compared to left ABI 0.95 and right ABI 0.94.  Abdominal Aortic Duplex  05/27/2019: Moderate plaque noted in the proximal, mid and distal aorta.  No abdominal aortic aneurysm. Distal abdominal aortic velocity is mildly elevated suggestive of <50% stenosis. Patient has h/o distal abdominal aortic stenosis and stenting on 10/29/2017. The stent appears patent.   Carotid artery duplex  10/09/2019: Doppler flow velocities in the right internal carotid artery are consistent with stenosis in the range of 50-69% with >= 50% heterogeneous plaque. Doppler flow velocities in the left internal carotid artery are consistent with stenosis in the range of 50-69% with >= 50% heterogeneous plaque. Antegrade right vertebral artery flow. Antegrade left vertebral artery flow. Compared to the study done on 04/17/2019, this progression of disease, right ICA stenosis is new. Follow up in six months is appropriate if clinically indicated.  Echocardiogram 10/16/2019:  Normal LV  systolic function with EF 60%. Left ventricle cavity is normal in size. Normal global wall motion. Normal diastolic filling pattern.  Calculated EF 60%.  Left atrial cavity is mildly dilated in 4 chamber views.  Structurally normal mitral valve. Mild (Grade I) mitral regurgitation.  Structurally normal tricuspid valve. Mild tricuspid regurgitation. No evidence of pulmonary hypertension.  Compared to 02/25/2017, no significant change.   Renal angiogram 05/10/2020: Patent Rt renal stent (placed 2015, 6.0 x 15 mm Herculink) 100% restenosis of Lt renals stent with flush occlusion 6.0 x 18 mm Herculink)  Successful angioplasty Lt renal stent Antegrade dissection in distal Lt renal artery with decreased distal flow, too distal for stenting. There is blush in left kidney.  She will be monitored for 6-8 hrs for any signs of renal infarct.   Renal artery duplex 06/15/2020:  No evidence of renal artery occlusive disease in either renal artery.  Mild increase in right vascular resistivity index. Normal intrarenal vascular perfusion is noted in the left kidney.  Diffuse plaque noted in the abdominal aorta. Clinical correlation is suggested.  Study suggests patent renal artery stents.   EKG   EKG 03/24/2020: Normal sinus rhythm with rate of 58 bpm, normal axis, no  evidence of ischemia.  Borderline criteria for LVH with R in V5 26 mm.   No significant change from EKG 02/10/2020.  Assessment     ICD-10-CM   1. Resistant hypertension  I10   2. Shortness of breath  R06.02   3. Pure hypercholesterolemia  E78.00     No orders of the defined types were placed in this encounter.   There are no discontinued medications.  Recommendations:   Lameisha Schuenemann  is a 59 y.o. Caucasian female  with hypertension, COPD, diastolic CHF, ongoing tobacco abuse (2 cig a day), and difficult to control hypertension with CKD, with bilateral renal artery angioplasty, PAD with distal abdominal aorta  stenosis S/P stenting with 10x 29 mm Omnilink Elite stent. She underwent coronary angiogram in June 2019 for nitroglycerin responsive chest pain that revealed normal coronary arteries.  Repeat renal angiography with balloon angioplasty 05/10/2020.  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  Patient presents today with complaints of shortness of breath both with exertion and at rest worse from baseline for the last 2 weeks.  At baseline she does have chronic dyspnea.  Exam revealed diffuse bilateral wheezing, worse in the upper lung fields, without crackles or rales at the bases of the lungs.  Exam today without clinical signs of acute heart failure.  Suspect shortness of breath to be related to underlying COPD exacerbation as patient has been without inhalers for approximately 1 month.  Suggest she follow-up with primary care provider, as well as refill inhalers as soon as possible.  As patient does have history of diastolic heart failure will obtain BNP to evaluate potential cardiac etiology of shortness of breath.  If symptoms fail to improve with proper COPD treatment, could consider further cardiac evaluation in the future including echocardiogram.  Patient's blood pressure is also elevated today.  She was recently seen by nephrology for the first time and hydralazine was increased from 25 mg to 50 mg 3 times daily, however patient has not increased this dose yet.  Encourage patient to continue taking antihypertensive medications as directed and to increase hydralazine as directed by nephrology.  Elevated blood pressure may also be contributing to both shortness of breath and potential anginal symptoms.  Encourage patient to continue to monitor blood pressure.  Discussed with patient results of renal duplex 06/15/2020 which revealed patent renal artery stenosis.  However also noted diffuse abdominal aortic plaque.  Reiterated to patient importance of  smoking cessation as well as medication, and diet and lifestyle compliance.  As patient's generalized muscle weakness and muscle aches have not improved since stopping atorvastatin suspect these are not related to her statin use.  However at this time patient wishes to continue with a statin holiday until follow-up in our office.  Will plan to restart atorvastatin 80 mg daily at next visit if patient still does not have improvement in myalgia.  Of note patient was very tearful and anxious throughout today's visit, I gave reassurance to patient.   Follow-up in 2 weeks for reevaluation of symptoms, hypertension.   ***

## 2020-08-01 ENCOUNTER — Ambulatory Visit: Payer: Medicare Other | Admitting: Student

## 2020-08-01 DIAGNOSIS — E7800 Pure hypercholesterolemia, unspecified: Secondary | ICD-10-CM

## 2020-08-01 DIAGNOSIS — R0602 Shortness of breath: Secondary | ICD-10-CM

## 2020-08-01 DIAGNOSIS — I10 Essential (primary) hypertension: Secondary | ICD-10-CM

## 2020-08-04 ENCOUNTER — Emergency Department (HOSPITAL_COMMUNITY): Payer: Medicare Other

## 2020-08-04 ENCOUNTER — Other Ambulatory Visit: Payer: Self-pay

## 2020-08-04 ENCOUNTER — Inpatient Hospital Stay (HOSPITAL_COMMUNITY)
Admission: EM | Admit: 2020-08-04 | Discharge: 2020-08-07 | DRG: 378 | Disposition: A | Discharge status: Home / Self Care | Payer: Medicare Other | Attending: Internal Medicine | Admitting: Internal Medicine

## 2020-08-04 ENCOUNTER — Other Ambulatory Visit: Payer: Self-pay | Admitting: Cardiology

## 2020-08-04 DIAGNOSIS — Z881 Allergy status to other antibiotic agents status: Secondary | ICD-10-CM

## 2020-08-04 DIAGNOSIS — Z79891 Long term (current) use of opiate analgesic: Secondary | ICD-10-CM

## 2020-08-04 DIAGNOSIS — K5909 Other constipation: Secondary | ICD-10-CM | POA: Diagnosis present

## 2020-08-04 DIAGNOSIS — K635 Polyp of colon: Secondary | ICD-10-CM | POA: Diagnosis present

## 2020-08-04 DIAGNOSIS — I120 Hypertensive chronic kidney disease with stage 5 chronic kidney disease or end stage renal disease: Secondary | ICD-10-CM | POA: Diagnosis present

## 2020-08-04 DIAGNOSIS — F17210 Nicotine dependence, cigarettes, uncomplicated: Secondary | ICD-10-CM | POA: Diagnosis present

## 2020-08-04 DIAGNOSIS — Z7902 Long term (current) use of antithrombotics/antiplatelets: Secondary | ICD-10-CM

## 2020-08-04 DIAGNOSIS — Z955 Presence of coronary angioplasty implant and graft: Secondary | ICD-10-CM

## 2020-08-04 DIAGNOSIS — Z8541 Personal history of malignant neoplasm of cervix uteri: Secondary | ICD-10-CM

## 2020-08-04 DIAGNOSIS — D638 Anemia in other chronic diseases classified elsewhere: Secondary | ICD-10-CM | POA: Diagnosis present

## 2020-08-04 DIAGNOSIS — K922 Gastrointestinal hemorrhage, unspecified: Secondary | ICD-10-CM | POA: Diagnosis present

## 2020-08-04 DIAGNOSIS — D649 Anemia, unspecified: Secondary | ICD-10-CM

## 2020-08-04 DIAGNOSIS — E785 Hyperlipidemia, unspecified: Secondary | ICD-10-CM | POA: Diagnosis present

## 2020-08-04 DIAGNOSIS — Z7982 Long term (current) use of aspirin: Secondary | ICD-10-CM

## 2020-08-04 DIAGNOSIS — Z86010 Personal history of colonic polyps: Secondary | ICD-10-CM

## 2020-08-04 DIAGNOSIS — Z91041 Radiographic dye allergy status: Secondary | ICD-10-CM

## 2020-08-04 DIAGNOSIS — K5521 Angiodysplasia of colon with hemorrhage: Principal | ICD-10-CM | POA: Diagnosis present

## 2020-08-04 DIAGNOSIS — I2510 Atherosclerotic heart disease of native coronary artery without angina pectoris: Secondary | ICD-10-CM | POA: Diagnosis present

## 2020-08-04 DIAGNOSIS — Z9114 Patient's other noncompliance with medication regimen: Secondary | ICD-10-CM

## 2020-08-04 DIAGNOSIS — D62 Acute posthemorrhagic anemia: Secondary | ICD-10-CM | POA: Diagnosis present

## 2020-08-04 DIAGNOSIS — I252 Old myocardial infarction: Secondary | ICD-10-CM

## 2020-08-04 DIAGNOSIS — K581 Irritable bowel syndrome with constipation: Secondary | ICD-10-CM | POA: Diagnosis present

## 2020-08-04 DIAGNOSIS — Z90710 Acquired absence of both cervix and uterus: Secondary | ICD-10-CM

## 2020-08-04 DIAGNOSIS — F209 Schizophrenia, unspecified: Secondary | ICD-10-CM | POA: Diagnosis present

## 2020-08-04 DIAGNOSIS — Z85828 Personal history of other malignant neoplasm of skin: Secondary | ICD-10-CM

## 2020-08-04 DIAGNOSIS — I10 Essential (primary) hypertension: Secondary | ICD-10-CM | POA: Diagnosis present

## 2020-08-04 DIAGNOSIS — Z90722 Acquired absence of ovaries, bilateral: Secondary | ICD-10-CM

## 2020-08-04 DIAGNOSIS — Z7951 Long term (current) use of inhaled steroids: Secondary | ICD-10-CM

## 2020-08-04 DIAGNOSIS — N185 Chronic kidney disease, stage 5: Secondary | ICD-10-CM | POA: Diagnosis present

## 2020-08-04 DIAGNOSIS — F419 Anxiety disorder, unspecified: Secondary | ICD-10-CM | POA: Diagnosis present

## 2020-08-04 DIAGNOSIS — I739 Peripheral vascular disease, unspecified: Secondary | ICD-10-CM | POA: Diagnosis present

## 2020-08-04 DIAGNOSIS — K5730 Diverticulosis of large intestine without perforation or abscess without bleeding: Secondary | ICD-10-CM | POA: Diagnosis present

## 2020-08-04 DIAGNOSIS — R109 Unspecified abdominal pain: Secondary | ICD-10-CM

## 2020-08-04 DIAGNOSIS — Z79899 Other long term (current) drug therapy: Secondary | ICD-10-CM

## 2020-08-04 DIAGNOSIS — Z9049 Acquired absence of other specified parts of digestive tract: Secondary | ICD-10-CM

## 2020-08-04 DIAGNOSIS — F319 Bipolar disorder, unspecified: Secondary | ICD-10-CM | POA: Diagnosis present

## 2020-08-04 DIAGNOSIS — Z20822 Contact with and (suspected) exposure to covid-19: Secondary | ICD-10-CM | POA: Diagnosis present

## 2020-08-04 DIAGNOSIS — H409 Unspecified glaucoma: Secondary | ICD-10-CM | POA: Diagnosis present

## 2020-08-04 DIAGNOSIS — Z8249 Family history of ischemic heart disease and other diseases of the circulatory system: Secondary | ICD-10-CM

## 2020-08-04 DIAGNOSIS — G2581 Restless legs syndrome: Secondary | ICD-10-CM | POA: Diagnosis present

## 2020-08-04 DIAGNOSIS — I701 Atherosclerosis of renal artery: Secondary | ICD-10-CM | POA: Diagnosis present

## 2020-08-04 DIAGNOSIS — G8929 Other chronic pain: Secondary | ICD-10-CM | POA: Diagnosis present

## 2020-08-04 DIAGNOSIS — J449 Chronic obstructive pulmonary disease, unspecified: Secondary | ICD-10-CM | POA: Diagnosis present

## 2020-08-04 DIAGNOSIS — K219 Gastro-esophageal reflux disease without esophagitis: Secondary | ICD-10-CM | POA: Diagnosis present

## 2020-08-04 DIAGNOSIS — Z716 Tobacco abuse counseling: Secondary | ICD-10-CM

## 2020-08-04 DIAGNOSIS — D631 Anemia in chronic kidney disease: Secondary | ICD-10-CM | POA: Diagnosis present

## 2020-08-04 DIAGNOSIS — Z95828 Presence of other vascular implants and grafts: Secondary | ICD-10-CM

## 2020-08-04 DIAGNOSIS — Z885 Allergy status to narcotic agent status: Secondary | ICD-10-CM

## 2020-08-04 DIAGNOSIS — Z560 Unemployment, unspecified: Secondary | ICD-10-CM

## 2020-08-04 LAB — COMPREHENSIVE METABOLIC PANEL
ALT: 16 U/L (ref 0–44)
AST: 26 U/L (ref 15–41)
Albumin: 4 g/dL (ref 3.5–5.0)
Alkaline Phosphatase: 105 U/L (ref 38–126)
Anion gap: 14 (ref 5–15)
BUN: 20 mg/dL (ref 6–20)
CO2: 21 mmol/L — ABNORMAL LOW (ref 22–32)
Calcium: 9.5 mg/dL (ref 8.9–10.3)
Chloride: 98 mmol/L (ref 98–111)
Creatinine, Ser: 1.83 mg/dL — ABNORMAL HIGH (ref 0.44–1.00)
GFR, Estimated: 32 mL/min — ABNORMAL LOW (ref >60)
Glucose, Bld: 112 mg/dL — ABNORMAL HIGH (ref 70–99)
Potassium: 3.9 mmol/L (ref 3.5–5.1)
Sodium: 133 mmol/L — ABNORMAL LOW (ref 135–145)
Total Bilirubin: 0.6 mg/dL (ref 0.3–1.2)
Total Protein: 7.7 g/dL (ref 6.5–8.1)

## 2020-08-04 LAB — CBC WITH DIFFERENTIAL/PLATELET
Abs Immature Granulocytes: 0.03 10*3/uL (ref 0.00–0.07)
Basophils Absolute: 0.1 10*3/uL (ref 0.0–0.1)
Basophils Relative: 1 %
Eosinophils Absolute: 0.2 10*3/uL (ref 0.0–0.5)
Eosinophils Relative: 2 %
HCT: 17.4 % — ABNORMAL LOW (ref 36.0–46.0)
Hemoglobin: 5 g/dL — CL (ref 12.0–15.0)
Immature Granulocytes: 0 %
Lymphocytes Relative: 16 %
Lymphs Abs: 1.5 10*3/uL (ref 0.7–4.0)
MCH: 23.7 pg — ABNORMAL LOW (ref 26.0–34.0)
MCHC: 28.7 g/dL — ABNORMAL LOW (ref 30.0–36.0)
MCV: 82.5 fL (ref 80.0–100.0)
Monocytes Absolute: 0.6 10*3/uL (ref 0.1–1.0)
Monocytes Relative: 7 %
Neutro Abs: 6.6 10*3/uL (ref 1.7–7.7)
Neutrophils Relative %: 74 %
Platelets: 424 10*3/uL — ABNORMAL HIGH (ref 150–400)
RBC: 2.11 MIL/uL — ABNORMAL LOW (ref 3.87–5.11)
RDW: 18.3 % — ABNORMAL HIGH (ref 11.5–15.5)
WBC: 9 10*3/uL (ref 4.0–10.5)
nRBC: 0 % (ref 0.0–0.2)

## 2020-08-04 LAB — TROPONIN I (HIGH SENSITIVITY): Troponin I (High Sensitivity): 22 ng/L — ABNORMAL HIGH (ref <18)

## 2020-08-04 LAB — POC OCCULT BLOOD, ED: Fecal Occult Bld: NEGATIVE

## 2020-08-04 LAB — LIPASE, BLOOD: Lipase: 39 U/L (ref 11–51)

## 2020-08-04 LAB — PREPARE RBC (CROSSMATCH)

## 2020-08-04 MED ORDER — PANTOPRAZOLE SODIUM 40 MG IV SOLR
40.0000 mg | Freq: Once | INTRAVENOUS | Status: AC
  Administered 2020-08-04: 40 mg via INTRAVENOUS
  Filled 2020-08-04: qty 40

## 2020-08-04 MED ORDER — HYDROMORPHONE HCL 1 MG/ML IJ SOLN
1.0000 mg | Freq: Once | INTRAMUSCULAR | Status: AC
  Administered 2020-08-04: 1 mg via INTRAVENOUS
  Filled 2020-08-04: qty 1

## 2020-08-04 MED ORDER — MORPHINE SULFATE (PF) 4 MG/ML IV SOLN
4.0000 mg | Freq: Once | INTRAVENOUS | Status: AC
  Administered 2020-08-04: 4 mg via INTRAVENOUS
  Filled 2020-08-04: qty 1

## 2020-08-04 MED ORDER — ONDANSETRON HCL 4 MG/2ML IJ SOLN
4.0000 mg | Freq: Once | INTRAMUSCULAR | Status: AC
  Administered 2020-08-04: 4 mg via INTRAVENOUS
  Filled 2020-08-04: qty 2

## 2020-08-04 MED ORDER — SODIUM CHLORIDE 0.9 % IV BOLUS: 500.0000 mL | Freq: Once | INTRAVENOUS | Status: DC

## 2020-08-04 MED ORDER — SODIUM CHLORIDE 0.9 % IV SOLN
10.0000 mL/h | Freq: Once | INTRAVENOUS | Status: AC
  Administered 2020-08-05: 10 mL/h via INTRAVENOUS

## 2020-08-04 NOTE — ED Notes (Signed)
Pt oxygen decreased to 80% on room air, attempted to sit pt up and reposition without improvement. Pt placed on 2L  and oxygen increased to 98%.

## 2020-08-04 NOTE — ED Provider Notes (Signed)
[facility] DEPT Provider Note   CSN: [id number] Arrival date & time: 08/04/20  2137     History Chief Complaint  Patient presents with  . Abdominal Pain    [name] is a 59 y.o. female with a history of SBO, pancreatitis, hypertension, hyperlipidemia, COPD, bipolar 1 disorder, CHF, CAD, CKD, renal artery stenosis, diverticulosis, and prior abdominal surgeries including appendectomy, total hysterectomy, R oophorectomy, & colon resection who presents to the ED with complaints of abdominal pain x 3-4 days. Patient states pain is located primarily in the LLQ but is generalized, it is constant, progressively worsening, aggravated when she attempts to eat, no alleviating factors. Last BM 4 days ago- small/hard, still passing gas, has had nausea with 2 episode of emesis yesterday. Also reports subjective fevers & chills. She states when her abdominal pain is really bad she also gets L sided chest pain that lasts up to 10 minutes prior to resolution. Also feels short of breath with bad pain at times. Denies hematemesis, melena, urgency, frequency, vaginal bleeding, vaginal discharge, leg pain/swelling, or syncope.   HPIS      Past Medical History:  Diagnosis Date  . Anemia 2008  . Anxiety   . Aortic stenosis   . Arthritis    "all my joints ache" (09/07/2014)  . Asthma   . Bipolar 1 disorder ([place])   . Bradycardia   . Bruit   . Carpal tunnel syndrome   . Cervical cancer ([place]) 1985  . CHF (congestive heart failure) ([place])   . Chronic kidney disease (CKD), stage V ([place])   . COPD (chronic obstructive pulmonary disease) ([place]) 2000  . Coronary artery disease   . Daily headache   . Depression 2000  . Diverticulitis 2008  . GERD (gastroesophageal reflux disease)   . Glaucoma   . Heart murmur   . History of colon polyps 2009  . HLD (hyperlipidemia) 2013  . Hypertension 2013  . Hypovitaminosis D   . IBS (irritable bowel syndrome) 2008  .  Myocardial infarction ([place])    2015  . PAD (peripheral artery disease) ([place])   . Pancreatitis 10/2011  . Pneumonia 07/2014  . RLS (restless legs syndrome)   . Schizophrenia ([place])   . Shortness of breath   . Skin cancer   . Small bowel obstruction ([place]) 2008    Patient Active Problem List   Diagnosis Date Noted  . Renal artery stenosis ([place]) 05/10/2020  . Abdominal aortic stenosis 11/30/2018  . Pure hypercholesterolemia 10/22/2018  . Abnormal stress test 03/21/2018  . At high risk for injury related to fall 12/26/2017  . Renal artery stenosis, native, bilateral ([place]) 10/27/2017  . Benign neoplasm of transverse colon   . Benign neoplasm of descending colon   . Benign neoplasm of sigmoid colon   . Abnormality on screening test 06/22/2016  . Chest pain 11/01/2015  . History of partial colectomy 10/19/2014  . 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 Anemia, chronic disease 07/30/2014  . Heart failure with preserved ejection fraction ([place]) 07/02/2014  . COPD GOLD 0 / still smoking 07/02/2014  . CKD (chronic kidney disease) stage 3, GFR 30-59 ml/min (H[facility]) 07/02/2014  . Protein-calorie malnutrition, severe ([place]) 07/02/2014  . Hyperparathyroidism, secondary renal ([place]) 04/30/2014  . Postsurgical menopause 08/14/2013  . Vaginal atrophy 08/14/2013  . Screening for breast cancer 07/21/2012  . Diverticulitis of colon with perforation 06/18/2012  . Affective bipolar disorder ([place]) 02/02/2009  . ADHESIONS, INTESTINAL W/OBSTRUCTION 08/10/2008  . Irritable  bowel syndrome 08/10/2008  . Abdominal pain, generalized 08/10/2008  . FECAL OCCULT BLOOD 08/10/2008  . Anemia, iron deficiency 08/10/2008  . Anxiety state 08/09/2008  . DEPRESSION 08/09/2008  . Essential hypertension 08/09/2008  . ASTHMA 08/09/2008  . ESOPHAGEAL STRICTURE 08/09/2008  . GERD 08/09/2008  . HIATAL HERNIA 08/09/2008  . Diverticulosis of large intestine 08/09/2008  . ARTHRITIS 08/09/2008  . HEADACHE, CHRONIC  08/09/2008  . SKIN CANCER, HX OF 08/09/2008  . COLONIC POLYPS, HYPERPLASTIC, HX OF 08/09/2008  . Cigarette smoker 05/17/2008  . Schizophrenia ([place]) 04/30/2008    Past Surgical History:  Procedure Laterality Date  . ABDOMINAL ANGIOGRAM N/A 09/07/2014   Procedure: ABDOMINAL ANGIOGRAM;  Surgeon: Laverda Page, MD;  Location: [facility] CATH LAB;  Service: Cardiovascular;  Laterality: N/A;  . APPENDECTOMY  1996  . COLON SURGERY  2008   6 inches of colon removed due to obstruction  . COLONOSCOPY WITH PROPOFOL N/A 10/25/2016   Procedure: COLONOSCOPY WITH PROPOFOL;  Surgeon: Milus Banister, MD;  Location: [facility] ENDOSCOPY;  Service: Endoscopy;  Laterality: N/A;  . CORONARY ANGIOPLASTY WITH STENT PLACEMENT  09/07/2014   "2"  . LEFT HEART CATH AND CORONARY ANGIOGRAPHY N/A 03/25/2018   Procedure: LEFT HEART CATH AND CORONARY ANGIOGRAPHY;  Surgeon: Nigel Mormon, MD;  Location: [facility] CV LAB;  Service: Cardiovascular;  Laterality: N/A;  . LEFT HEART CATHETERIZATION WITH CORONARY ANGIOGRAM N/A 09/07/2014   Procedure: LEFT HEART CATHETERIZATION WITH CORONARY ANGIOGRAM;  Surgeon: Laverda Page, MD;  Location: [facility] CATH LAB;  Service: Cardiovascular;  Laterality: N/A;  . LOWER EXTREMITY ANGIOGRAPHY  10/29/2017   Procedure: Lower Extremity Angiography;  Surgeon: Adrian Prows, MD;  Location: [facility] CV LAB;  Service: Cardiovascular;;  . PERIPHERAL VASCULAR CATHETERIZATION N/A 11/01/2015   Procedure: Renal Angiography;  Surgeon: Adrian Prows, MD;  Location: [facility] CV LAB;  Service: Cardiovascular;  Laterality: N/A;  . PERIPHERAL VASCULAR CATHETERIZATION N/A 04/10/2016   Procedure: Renal Angiography;  Surgeon: Adrian Prows, MD;  Location: [facility] CV LAB;  Service: Cardiovascular;  Laterality: N/A;  . PERIPHERAL VASCULAR CATHETERIZATION  04/10/2016   Procedure: Peripheral Vascular Intervention;  Surgeon: Adrian Prows, MD;  Location: [facility] CV LAB;  Service: Cardiovascular;;  . PERIPHERAL VASCULAR  INTERVENTION  10/29/2017   Procedure: PERIPHERAL VASCULAR INTERVENTION;  Surgeon: Adrian Prows, MD;  Location: [facility] CV LAB;  Service: Cardiovascular;;  . RENAL ANGIOGRAPHY N/A 10/29/2017   Procedure: RENAL ANGIOGRAPHY;  Surgeon: Adrian Prows, MD;  Location: [facility] CV LAB;  Service: Cardiovascular;  Laterality: N/A;  . RENAL ANGIOGRAPHY N/A 05/10/2020   Procedure: RENAL ANGIOGRAPHY;  Surgeon: Nigel Mormon, MD;  Location: [facility] CV LAB;  Service: Cardiovascular;  Laterality: N/A;  . RIGHT OOPHORECTOMY Right 1996  . TOTAL ABDOMINAL HYSTERECTOMY  1997     OB History    Gravida  2   Para      Term      Preterm      AB  1   Living  1     SAB      TAB      Ectopic  1   Multiple      Live Births  1           Family History  Problem Relation Age of Onset  . Other Mother        many bowel obstructions  . Heart disease Mother   . Colon polyps Mother   . Kidney cancer Father   .  Bone cancer Father   . Diabetes Father   . Heart disease Father   . Diabetes Daughter   . Colon cancer Paternal Grandfather   . Heart disease Brother   . Esophageal cancer Neg Hx     Social History   Tobacco Use  . Smoking status: Current Every Day Smoker    Packs/day: 0.25    Years: 40.00    Pack years: 10.00    Types: Cigarettes  . Smokeless tobacco: Never Used  Vaping Use  . Vaping Use: Former  Substance Use Topics  . Alcohol use: No  . Drug use: Yes    Frequency: 3.0 times per week    Types: Marijuana    Comment: last use yesterday    Home Medications Prior to Admission medications   Medication Sig Start Date End Date Taking? Authorizing Provider  acetaminophen (TYLENOL) 500 MG tablet Take 1,000 mg by mouth every 8 (eight) hours as needed for mild pain or headache.   Yes [provider]  ALPRAZolam Duanne Moron) 1 MG tablet Take 0.5 tablets by mouth 2 (two) times daily as needed for anxiety.  04/15/17  Yes [provider]  amLODipine (NORVASC)  10 MG tablet Take 0.5 tablets (5 mg total) by mouth daily. 07/18/20  Yes Cantwell, Celeste C, PA-C  aspirin EC 81 MG tablet Take 1 tablet (81 mg total) by mouth daily. 05/10/20 05/10/21 Yes Patwardhan, Manish J, MD  atenolol (TENORMIN) 25 MG tablet TAKE 1 TABLET BY MOUTH ONCE A DAY 08/04/20  Yes Cantwell, Celeste C, PA-C  atorvastatin (LIPITOR) 80 MG tablet Take 80 mg by mouth daily.    Yes [provider]  azelastine (ASTELIN) 0.1 % nasal spray Place 1 spray into both nostrils daily as needed for rhinitis or allergies.  06/24/19 08/04/20 Yes [provider]  clopidogrel (PLAVIX) 75 MG tablet Take 1 tablet (75 mg total) by mouth daily. 05/10/20 05/10/21 Yes Patwardhan, Manish J, MD  ezetimibe (ZETIA) 10 MG tablet Take 1 tablet (10 mg total) by mouth daily. 03/24/20  Yes Adrian Prows, MD  isosorbide mononitrate (IMDUR) 60 MG 24 hr tablet TAKE 1 TABLET BY MOUTH DAILY 07/18/20  Yes Cantwell, Celeste C, PA-C  mometasone-formoterol (DULERA) 100-5 MCG/ACT AERO Take 2 puffs first thing in am and then another 2 puffs about 12 hours later. Patient taking differently: Inhale 2 puffs into the lungs daily.  04/26/17  Yes Tanda Rockers, MD  nitroGLYCERIN (NITROSTAT) 0.4 MG SL tablet Place 1 tablet (0.4 mg total) under the tongue every 5 (five) minutes as needed for chest pain. 10/15/19  Yes Miquel Dunn, NP  pantoprazole (PROTONIX) 40 MG tablet TAKE 1 TABLET BY MOUTH TWICE (2) DAILY Patient taking differently: Take 40 mg by mouth 2 (two) times daily.  06/08/20  Yes Adrian Prows, MD  traZODone (DESYREL) 100 MG tablet Take 100 mg by mouth at bedtime as needed for sleep.  01/04/20  Yes [provider]  venlafaxine XR (EFFEXOR XR) 150 MG 24 hr capsule Take 1 capsule (150 mg total) by mouth daily with breakfast. Patient taking differently: Take 300 mg by mouth daily with breakfast.  11/09/15  Yes Patel-Mills, Hanna, PA-C  albuterol (PROVENTIL HFA;VENTOLIN HFA) 108 (90 BASE) MCG/ACT inhaler Inhale 2  puffs into the lungs 2 (two) times daily as needed for wheezing or shortness of breath (asthma).     [provider]  gabapentin (NEURONTIN) 400 MG capsule Take 1 capsule (400 mg total) by mouth 2 (two) times daily.  Patient takes 400 mg 3 times daily and 1200 mg at bedtime Patient not taking: Reported on 08/04/2020 07/04/14   Lucious Groves, DO  hydrALAZINE (APRESOLINE) 25 MG tablet Take 1 tablet (25 mg total) by mouth 3 (three) times daily. Patient taking differently: Take 50 mg by mouth 3 (three) times daily.  03/24/20 07/18/20  Adrian Prows, MD    Allergies    Doxycycline, Hydrocodone-acetaminophen, and Iohexol  Review of Systems   Review of Systems  Constitutional: Positive for chills and fever.  Respiratory: Positive for shortness of breath.   Cardiovascular: Positive for chest pain. Negative for leg swelling.  Gastrointestinal: Positive for abdominal pain, constipation, nausea and vomiting. Negative for diarrhea.  Genitourinary: Negative for frequency and urgency.  Neurological: Negative for syncope.  All other systems reviewed and are negative.   Physical Exam Updated Vital Signs BP (!) 185/83   Pulse 83   Temp 98.1 F (36.7 C)   Resp 16   Ht 5\' 1"  (1.549 m)   Wt 46.7 kg   SpO2 98%   BMI 19.46 kg/m   Physical Exam Vitals and nursing note reviewed. Exam conducted with a chaperone present.  Constitutional:      General: She is not in acute distress.    Appearance: She is well-developed. She is not toxic-appearing.  HENT:     Head: Normocephalic and atraumatic.  Eyes:     General:        Right eye: No discharge.        Left eye: No discharge.     Conjunctiva/sclera: Conjunctivae normal.  Cardiovascular:     Rate and Rhythm: Normal rate and regular rhythm.  Pulmonary:     Effort: Pulmonary effort is normal. No respiratory distress.     Breath sounds: Normal breath sounds. No wheezing, rhonchi or rales.  Abdominal:     General: A surgical scar is present.  There is no distension.     Palpations: Abdomen is soft.     Tenderness: There is abdominal tenderness in the right lower quadrant, epigastric area, periumbilical area, suprapubic area, left upper quadrant and left lower quadrant.  Genitourinary:    Comments: External w/o fissures or hemorrhoids.  DRE with no stool present- no melena/hematochezia or bright red blood. Fecal occult testing therefore limited.  Musculoskeletal:     Cervical back: Neck supple.     Comments: No asymmetric LE edema.   Skin:    General: Skin is warm and dry.     Findings: No rash.  Neurological:     Mental Status: She is alert.     Comments: Clear speech.   Psychiatric:        Behavior: Behavior normal.     ED Results / Procedures / Treatments   Labs (all labs ordered are listed, but only abnormal results are displayed) Labs Reviewed  COMPREHENSIVE METABOLIC PANEL - Abnormal; Notable for the following components:      Result Value   Sodium 133 (*)    CO2 21 (*)    Glucose, Bld 112 (*)    Creatinine, Ser 1.83 (*)    GFR, Estimated 32 (*)    All other components within normal limits  CBC WITH DIFFERENTIAL/PLATELET - Abnormal; Notable for the following components:   RBC 2.11 (*)    Hemoglobin 5.0 (*)    HCT 17.4 (*)    MCH 23.7 (*)    MCHC 28.7 (*)    RDW 18.3 (*)    Platelets 424 (*)  All other components within normal limits  TROPONIN I (HIGH SENSITIVITY) - Abnormal; Notable for the following components:   Troponin I (High Sensitivity) 22 (*)    All other components within normal limits  RESPIRATORY PANEL BY RT PCR (FLU A&B, COVID)  LIPASE, BLOOD  URINALYSIS, ROUTINE W REFLEX MICROSCOPIC  POC OCCULT BLOOD, ED  TYPE AND SCREEN  PREPARE RBC (CROSSMATCH)    EKG EKG Interpretation  Date/Time:  Thursday August 04 2020 23:14:42 EDT Ventricular Rate:  76 PR Interval:    QRS Duration: 94 QT Interval:  400 QTC Calculation: 450 R Axis:   74 Text Interpretation: Sinus rhythm Right atrial  enlargement Consider left ventricular hypertrophy Since last tracing rate faster Confirmed by Addison Lank [phone]) on 08/04/2020 11:19:36 PM   Radiology CT Abdomen Pelvis Wo Contrast  Result Date: 08/05/2020 CLINICAL DATA:  Left abdominal pain EXAM: CT ABDOMEN AND PELVIS WITHOUT CONTRAST TECHNIQUE: Multidetector CT imaging of the abdomen and pelvis was performed following the standard protocol without IV contrast. COMPARISON:  None. FINDINGS: Lower chest: Trace left pleural effusion.  Left base atelectasis. Hepatobiliary: No focal hepatic abnormality. Gallbladder unremarkable. Pancreas: No focal abnormality or ductal dilatation. Spleen: No focal abnormality.  Normal size. Adrenals/Urinary Tract: No adrenal abnormality. No focal renal abnormality. No stones or hydronephrosis. Urinary bladder is unremarkable. Stomach/Bowel: Stomach, large and small bowel grossly unremarkable. Vascular/Lymphatic: Diffuse aortoiliac atherosclerosis. No aneurysm or adenopathy. Reproductive: Prior hysterectomy.  No adnexal masses. Other: No free fluid or free air. Musculoskeletal: No acute bony abnormality. IMPRESSION: No acute findings in the abdomen or pelvis. Diffuse vascular atherosclerosis. Trace left pleural effusion with left base atelectasis. Electronically Signed   By: Rolm Baptise M.D.   On: 08/05/2020 00:14   DG Abd Acute W/Chest  Result Date: 08/04/2020 CLINICAL DATA:  Left-sided abdominal pain EXAM: DG ABDOMEN ACUTE WITH 1 VIEW CHEST COMPARISON:  None. FINDINGS: There is no evidence of dilated bowel loops or free intraperitoneal air. No radiopaque calculi or other significant radiographic abnormality is seen. Vascular stents are seen within the mid abdomen. Heart size and mediastinal contours are within normal limits. Both lungs are clear. IMPRESSION: Negative abdominal radiographs.  No acute cardiopulmonary disease. Electronically Signed   By: Prudencio Pair M.D.   On: 08/04/2020 22:41    Procedures .Critical  Care Performed by: Amaryllis Dyke, PA-C Authorized by: Amaryllis Dyke, PA-C    CRITICAL CARE Performed by: Kennith Maes   Total critical care time: 40 minutes  Critical care time was exclusive of separately billable procedures and treating other patients.  Critical care was necessary to treat or prevent imminent or life-threatening deterioration.  Critical care was time spent personally by me on the following activities: development of treatment plan with patient and/or surrogate as well as nursing, discussions with consultants, evaluation of patient's response to treatment, examination of patient, obtaining history from patient or surrogate, ordering and performing treatments and interventions, ordering and review of laboratory studies, ordering and review of radiographic studies, pulse oximetry and re-evaluation of patient's condition.  (including critical care time)  Medications Ordered in ED Medications  0.9 %  sodium chloride infusion (has no administration in time range)  ondansetron (ZOFRAN) injection 4 mg (4 mg Intravenous Given 08/04/20 2220)  morphine 4 MG/ML injection 4 mg (4 mg Intravenous Given 08/04/20 2220)  HYDROmorphone (DILAUDID) injection 1 mg (1 mg Intravenous Given 08/04/20 2315)  pantoprazole (PROTONIX) injection 40 mg (40 mg Intravenous Given 08/04/20 2315)    ED Course  I  have reviewed the triage vital signs and the nursing notes.  Pertinent labs & imaging results that were available during my care of the patient were reviewed by me and considered in my medical decision making (see chart for details).    MDM Rules/Calculators/A&P                         Patient presents to the ED with complaints of abdominal pain.  Nontoxic, BP elevated, vitals otherwise fairly unremarkable.  Abdominal tenderness fairly generalized worse to the L side, not much tenderness in the RUQ.   Additional history obtained:  Additional history obtained from  chart review & nursing note review. . Lab Tests:  I Ordered, reviewed, and interpreted labs, which included:  CBC: Significant acute anemia with hemoglobin 5.0 and hematocrit 17.4.  Mild elevation in platelets.  No leukocytosis or leukopenia. CMP: Renal function is improved from prior. Lipase: Within normal limits Troponin: Mild elevation-no prior on record for comparison.  Imaging Studies ordered:  I ordered imaging studies which included acute chest/abdominal series, I independently visualized and interpreted imaging which showed no acute process. Subsequent CT A/P wo contrast ordered per discussion w/ Dr. Roslynn Amble.   Additional analgesics ordered given patient with only mild pain relief w/ morphine, did desaturate some with morphine initially per RN, patient maintaining her respiratory rate appropriately on re-assessment and saturating well on 2L via [place]. Will monitor closely.   Patient with significant anemia, on further discussion she does state that she did note a little bit of blood in the rectal area earlier today, however she has not been having bowel movements over the past few days, does not recall any specific melena.  DRE was limited as there was no stool in the vault, no blood present either.  IV Protonix, type and screen, and 2 units of blood ordered, her vitals are overall reassuring, she remains hypertensive.  Most recent colonoscopy 10/25/2016: multiple polyps present which were removed during procedure Most recent endoscopy I could find 09/02/2008: Examination was normal.   CT A/P without acute process in abdomen/pelvis- additional findings as above.   Concern for GI bleed w/ patient reported rectal bleeding& anemia. Will discuss w/ hospitalist service for admission- will also place non-emergent GI consult.   Patient updated on results & plan of care & is in agreement. Initially patient denied anticoagulation use to me, then stated she takes warfarin- PT/INR ordered & WNL-  subtherapeutic.   Discussed with hospitliast service Dr. Humphrey Rolls who accepts patient for admission. Non emergent GI consultation placed for AM evaluation.   Findings and plan of care discussed with supervising physician Dr. Roslynn Amble who is in agreement.   Portions of this note were generated with [employer]. Dictation errors may occur despite best attempts at proofreading.  Final Clinical Impression(s) / ED Diagnoses Final diagnoses:  Abdominal pain, unspecified abdominal location  Anemia, unspecified type    Rx / DC Orders ED Discharge Orders    None       Amaryllis Dyke, PA-C 08/05/20 0236    Lucrezia Starch, MD 08/06/20 [phone]

## 2020-08-04 NOTE — ED Notes (Signed)
Date and time results received: 08/04/20 2227 (use smartphrase ".now" to insert current time)  Test: hemoglobin Critical Value: 5.0  Name of Provider Notified: Kennith Maes, PA  Orders Received? Or Actions Taken?: see new orders

## 2020-08-04 NOTE — ED Triage Notes (Signed)
Pt c/o L sided abd pain x 4 days, last BM 4 days ago as well, reports nausea/dry heaves but no vomiting, hx bowel resection, denies fevers

## 2020-08-05 ENCOUNTER — Encounter (HOSPITAL_COMMUNITY): Payer: Self-pay | Admitting: Internal Medicine

## 2020-08-05 DIAGNOSIS — K922 Gastrointestinal hemorrhage, unspecified: Secondary | ICD-10-CM | POA: Diagnosis not present

## 2020-08-05 DIAGNOSIS — D62 Acute posthemorrhagic anemia: Secondary | ICD-10-CM | POA: Diagnosis present

## 2020-08-05 DIAGNOSIS — R109 Unspecified abdominal pain: Secondary | ICD-10-CM

## 2020-08-05 DIAGNOSIS — D649 Anemia, unspecified: Secondary | ICD-10-CM

## 2020-08-05 LAB — URINALYSIS, ROUTINE W REFLEX MICROSCOPIC
Bilirubin Urine: NEGATIVE
Glucose, UA: NEGATIVE mg/dL
Hgb urine dipstick: NEGATIVE
Ketones, ur: NEGATIVE mg/dL
Leukocytes,Ua: NEGATIVE
Nitrite: NEGATIVE
Protein, ur: >=300 mg/dL — AB
Specific Gravity, Urine: 1.014 (ref 1.005–1.030)
pH: 5 (ref 5.0–8.0)

## 2020-08-05 LAB — RESPIRATORY PANEL BY RT PCR (FLU A&B, COVID)
Influenza A by PCR: NEGATIVE
Influenza B by PCR: NEGATIVE
SARS Coronavirus 2 by RT PCR: NEGATIVE

## 2020-08-05 LAB — ABO/RH: ABO/RH(D): O NEG

## 2020-08-05 LAB — HEMOGLOBIN AND HEMATOCRIT, BLOOD
HCT: 26.2 % — ABNORMAL LOW (ref 36.0–46.0)
Hemoglobin: 8 g/dL — ABNORMAL LOW (ref 12.0–15.0)

## 2020-08-05 LAB — TROPONIN I (HIGH SENSITIVITY): Troponin I (High Sensitivity): 21 ng/L — ABNORMAL HIGH (ref <18)

## 2020-08-05 LAB — PROTIME-INR
INR: 1.1 (ref 0.8–1.2)
Prothrombin Time: 13.7 seconds (ref 11.4–15.2)

## 2020-08-05 MED ORDER — OXYCODONE-ACETAMINOPHEN 5-325 MG PO TABS
1.0000 | ORAL_TABLET | Freq: Four times a day (QID) | ORAL | Status: DC | PRN
  Administered 2020-08-05: 1 via ORAL
  Filled 2020-08-05: qty 1

## 2020-08-05 MED ORDER — ALBUTEROL SULFATE HFA 108 (90 BASE) MCG/ACT IN AERS
2.0000 | INHALATION_SPRAY | Freq: Two times a day (BID) | RESPIRATORY_TRACT | Status: DC | PRN
  Filled 2020-08-05: qty 6.7

## 2020-08-05 MED ORDER — HYDRALAZINE HCL 20 MG/ML IJ SOLN
10.0000 mg | INTRAMUSCULAR | Status: DC | PRN
  Administered 2020-08-07: 10 mg via INTRAVENOUS

## 2020-08-05 MED ORDER — ONDANSETRON HCL 4 MG PO TABS: 4.0000 mg | ORAL_TABLET | Freq: Four times a day (QID) | ORAL | Status: DC | PRN

## 2020-08-05 MED ORDER — OXYCODONE-ACETAMINOPHEN 5-325 MG PO TABS
1.0000 | ORAL_TABLET | ORAL | Status: DC | PRN
  Administered 2020-08-05 (×2): 2 via ORAL
  Administered 2020-08-06: 1 via ORAL
  Administered 2020-08-06 – 2020-08-07 (×4): 2 via ORAL
  Filled 2020-08-05 (×3): qty 2
  Filled 2020-08-05: qty 1
  Filled 2020-08-05 (×3): qty 2

## 2020-08-05 MED ORDER — ATENOLOL 25 MG PO TABS
25.0000 mg | ORAL_TABLET | Freq: Every day | ORAL | Status: DC
  Administered 2020-08-05 – 2020-08-07 (×3): 25 mg via ORAL
  Filled 2020-08-05 (×3): qty 1

## 2020-08-05 MED ORDER — ISOSORBIDE MONONITRATE ER 60 MG PO TB24
60.0000 mg | ORAL_TABLET | Freq: Every day | ORAL | Status: DC
  Administered 2020-08-05 – 2020-08-07 (×3): 60 mg via ORAL
  Filled 2020-08-05 (×3): qty 1

## 2020-08-05 MED ORDER — PANTOPRAZOLE SODIUM 40 MG PO TBEC
40.0000 mg | DELAYED_RELEASE_TABLET | Freq: Two times a day (BID) | ORAL | Status: DC
  Administered 2020-08-05 – 2020-08-07 (×5): 40 mg via ORAL
  Filled 2020-08-05 (×5): qty 1

## 2020-08-05 MED ORDER — PEG-KCL-NACL-NASULF-NA ASC-C 100 G PO SOLR
0.5000 | Freq: Two times a day (BID) | ORAL | Status: AC
  Administered 2020-08-05 – 2020-08-06 (×2): 100 g via ORAL
  Filled 2020-08-05: qty 1

## 2020-08-05 MED ORDER — NITROGLYCERIN 0.4 MG SL SUBL: 0.4000 mg | SUBLINGUAL_TABLET | SUBLINGUAL | Status: DC | PRN

## 2020-08-05 MED ORDER — HYOSCYAMINE SULFATE ER 0.375 MG PO TB12
0.3750 mg | ORAL_TABLET | Freq: Two times a day (BID) | ORAL | Status: AC
  Administered 2020-08-05 – 2020-08-06 (×2): 0.375 mg via ORAL
  Filled 2020-08-05 (×2): qty 1

## 2020-08-05 MED ORDER — MOMETASONE FURO-FORMOTEROL FUM 100-5 MCG/ACT IN AERO
2.0000 | INHALATION_SPRAY | Freq: Two times a day (BID) | RESPIRATORY_TRACT | Status: DC
  Administered 2020-08-05 – 2020-08-06 (×3): 2 via RESPIRATORY_TRACT
  Filled 2020-08-05: qty 8.8

## 2020-08-05 MED ORDER — AMLODIPINE BESYLATE 5 MG PO TABS
5.0000 mg | ORAL_TABLET | Freq: Every day | ORAL | Status: DC
  Administered 2020-08-05: 5 mg via ORAL
  Filled 2020-08-05: qty 1

## 2020-08-05 MED ORDER — ONDANSETRON HCL 4 MG/2ML IJ SOLN
4.0000 mg | Freq: Four times a day (QID) | INTRAMUSCULAR | Status: DC | PRN
  Administered 2020-08-06 (×3): 4 mg via INTRAVENOUS
  Filled 2020-08-05 (×3): qty 2

## 2020-08-05 MED ORDER — VENLAFAXINE HCL ER 150 MG PO CP24
150.0000 mg | ORAL_CAPSULE | Freq: Every day | ORAL | Status: DC
  Administered 2020-08-05: 150 mg via ORAL
  Filled 2020-08-05: qty 1
  Filled 2020-08-05: qty 2
  Filled 2020-08-05: qty 1

## 2020-08-05 MED ORDER — TRAZODONE HCL 100 MG PO TABS: 100.0000 mg | ORAL_TABLET | Freq: Every evening | ORAL | Status: DC | PRN

## 2020-08-05 MED ORDER — AMLODIPINE BESYLATE 10 MG PO TABS
10.0000 mg | ORAL_TABLET | Freq: Every day | ORAL | Status: DC
  Administered 2020-08-06 – 2020-08-07 (×2): 10 mg via ORAL
  Filled 2020-08-05 (×2): qty 1

## 2020-08-05 MED ORDER — ATORVASTATIN CALCIUM 40 MG PO TABS
80.0000 mg | ORAL_TABLET | Freq: Every day | ORAL | Status: DC
  Administered 2020-08-05 – 2020-08-07 (×3): 80 mg via ORAL
  Filled 2020-08-05 (×3): qty 2

## 2020-08-05 MED ORDER — ALPRAZOLAM 0.5 MG PO TABS
0.5000 mg | ORAL_TABLET | Freq: Two times a day (BID) | ORAL | Status: DC | PRN
  Administered 2020-08-05 (×2): 0.5 mg via ORAL
  Filled 2020-08-05 (×2): qty 1

## 2020-08-05 NOTE — Consult Note (Addendum)
Referring Provider:  [facility]         Primary Care Physician:  Bernerd Limbo, MD Primary Gastroenterologist:  Oretha Caprice, MD                We were asked to see this patient for:     GI bleed             ASSESSMENT / PLAN:   #GI bleed on Plavix.  Difficult determine if this is upper versus lower bleed.  In the setting of severe constipation patient passed brown stool with a very small amount of light red blood last week.  After laxatives and enemas she passed a black stool ( without red blood) last night. She hasn't seen any bright red blood other than the small amount last week. BUN is not out of proportion to creatinine.   --Since the origin of GI bleed is not entirely clear, we will proceed with EGD and colonoscopy.  This can be done on Saturday, her last dose of Plavix was Wednesday.  The risks and benefits of EGD and colonoscopy with possible polypectomy / biopsies were discussed and the patient agrees to proceed.  --Clear liquids okay for now --Please hold home Plavix  # Acute on chronic anemia.  Patient has CKD, baseline hemoglobin 11-12 range.  Hemoglobin in ED yesterday in yesterday was 5, improved to 8 post 2 units of blood  # Chronic constipation frequently associated with generalized left-sided abdominal pain.  No acute abnormalities on noncontrast CT scan of the abdomen and pelvis yesterday.  WBC normal.  # History of perforated diverticulitis requiring partial colectomy in 2008  # History of adenomatous colon polyps (2018).  At that time a 3-year surveillance colonoscopy was recommended  # Multiple medical problems as listed below      HPI:                                                                                                                             Chief Complaint:  Constipation, abdominal pain and blood in stool  [name] is a 59 y.o. female with a pmh significant for, not necessarily limited to: Hypertension, COPD, tobacco  dependence, CKD, peripheral artery disease with stent placement in abdominal aorta, CAD with stenting, chronic antiplatelet therapy,  anxiety/depression, diverticulosis, perforated diverticulitis requiring partial colectomy in 2008,  colon polyps, chronic constipation, GERD, appendectomy  Patient has a history of chronic intermittent constipation.  She generally does not take anything at home for constipation except on an as-needed basis.  2 weeks ago she became significantly constipated.  She took laxatives without significant results.  Last week she had a brown stool with a small amount of light red blood.  She took 2 enemas, still without significant improvement.  Last night she did finally have a bowel movement and it was black.  There was no red blood associated with the black stool.  She says the only time  that was red blood was last week and it was a small amount.  Patient takes Plavix.  Though it is listed in the records that she takes warfarin, patient denies this.  Her last Plavix was Wednesday.  Patient has left-sided abdominal pain associated with constipation.  She says this pain has been present intermittently with constipation for at least 6 years.  It generally starts in the LUQ but radiates downward into the LLQ.  Last week with constipation she had some nausea and vomiting of nonbloody emesis.  Her appetite is been diminished over the last couple of weeks.  She explains that the left-sided abdominal pain gets worse with eating  She was taking daily baby asa up until two weeks ago. No other NSAIDS.   PREVIOUS ENDOSCOPIC EVALUATIONS / PERTINENT STUDIES   January 2018 colonoscopy for evaluation of a positive Cologuard study.  -Exam complete with an excellent bowel prep. -2 sessile polyps removed from the descending colon and transverse colon measuring 3 to 4 mm in size -A 10 mm polyp was found in the distal sigmoid colon, removed in piecemeal fashion. -Path compatible with tubular adenomas  plus hyperplastic polyp  August 2018 flexible sigmoidoscopy for follow-up on 10 mm adenoma removed in January 2018 --Small rectal hyperplastic polyp    August 2018 EGD for dysphagia --normal.   Past Medical History:  Diagnosis Date  . Anemia 2008  . Anxiety   . Aortic stenosis   . Arthritis    "all my joints ache" (09/07/2014)  . Asthma   . Bipolar 1 disorder ([place])   . Bradycardia   . Bruit   . Carpal tunnel syndrome   . Cervical cancer ([place]) 1985  . CHF (congestive heart failure) ([place])   . Chronic kidney disease (CKD), stage V ([place])   . COPD (chronic obstructive pulmonary disease) ([place]) 2000  . Coronary artery disease   . Daily headache   . Depression 2000  . Diverticulitis 2008  . GERD (gastroesophageal reflux disease)   . Glaucoma   . Heart murmur   . History of colon polyps 2009  . HLD (hyperlipidemia) 2013  . Hypertension 2013  . Hypovitaminosis D   . IBS (irritable bowel syndrome) 2008  . Myocardial infarction ([place])    2015  . PAD (peripheral artery disease) ([place])   . Pancreatitis 10/2011  . Pneumonia 07/2014  . RLS (restless legs syndrome)   . Schizophrenia ([place])   . Shortness of breath   . Skin cancer   . Small bowel obstruction ([place]) 2008    Past Surgical History:  Procedure Laterality Date  . ABDOMINAL ANGIOGRAM N/A 09/07/2014   Procedure: ABDOMINAL ANGIOGRAM;  Surgeon: Laverda Page, MD;  Location: [facility] CATH LAB;  Service: Cardiovascular;  Laterality: N/A;  . APPENDECTOMY  1996  . COLON SURGERY  2008   6 inches of colon removed due to obstruction  . COLONOSCOPY WITH PROPOFOL N/A 10/25/2016   Procedure: COLONOSCOPY WITH PROPOFOL;  Surgeon: Milus Banister, MD;  Location: [facility] ENDOSCOPY;  Service: Endoscopy;  Laterality: N/A;  . CORONARY ANGIOPLASTY WITH STENT PLACEMENT  09/07/2014   "2"  . LEFT HEART CATH AND CORONARY ANGIOGRAPHY N/A 03/25/2018   Procedure: LEFT HEART CATH AND CORONARY ANGIOGRAPHY;  Surgeon: Nigel Mormon, MD;  Location: [place] CV LAB;  Service: Cardiovascular;  Laterality: N/A;  . LEFT HEART CATHETERIZATION WITH CORONARY ANGIOGRAM N/A 09/07/2014   Procedure: LEFT HEART CATHETERIZATION WITH CORONARY ANGIOGRAM;  Surgeon: Laverda Page, MD;  Location:  [place] CATH LAB;  Service: Cardiovascular;  Laterality: N/A;  . LOWER EXTREMITY ANGIOGRAPHY  10/29/2017   Procedure: Lower Extremity Angiography;  Surgeon: Adrian Prows, MD;  Location: [place] CV LAB;  Service: Cardiovascular;;  . PERIPHERAL VASCULAR CATHETERIZATION N/A 11/01/2015   Procedure: Renal Angiography;  Surgeon: Adrian Prows, MD;  Location: [place] CV LAB;  Service: Cardiovascular;  Laterality: N/A;  . PERIPHERAL VASCULAR CATHETERIZATION N/A 04/10/2016   Procedure: Renal Angiography;  Surgeon: Adrian Prows, MD;  Location: [place] CV LAB;  Service: Cardiovascular;  Laterality: N/A;  . PERIPHERAL VASCULAR CATHETERIZATION  04/10/2016   Procedure: Peripheral Vascular Intervention;  Surgeon: Adrian Prows, MD;  Location: [place] CV LAB;  Service: Cardiovascular;;  . PERIPHERAL VASCULAR INTERVENTION  10/29/2017   Procedure: PERIPHERAL VASCULAR INTERVENTION;  Surgeon: Adrian Prows, MD;  Location: [place] CV LAB;  Service: Cardiovascular;;  . RENAL ANGIOGRAPHY N/A 10/29/2017   Procedure: RENAL ANGIOGRAPHY;  Surgeon: Adrian Prows, MD;  Location: [place] CV LAB;  Service: Cardiovascular;  Laterality: N/A;  . RENAL ANGIOGRAPHY N/A 05/10/2020   Procedure: RENAL ANGIOGRAPHY;  Surgeon: Nigel Mormon, MD;  Location: [place] CV LAB;  Service: Cardiovascular;  Laterality: N/A;  . RIGHT OOPHORECTOMY Right 1996  . TOTAL ABDOMINAL HYSTERECTOMY  1997    Prior to Admission medications   Medication Sig Start Date End Date Taking? Authorizing Provider  acetaminophen (TYLENOL) 500 MG tablet Take 1,000 mg by mouth every 8 (eight) hours as needed for mild pain or headache.   Yes [provider]  ALPRAZolam Duanne Moron) 1 MG tablet Take 0.5 tablets by mouth 2 (two)  times daily as needed for anxiety.  04/15/17  Yes [provider]  amLODipine (NORVASC) 10 MG tablet Take 0.5 tablets (5 mg total) by mouth daily. 07/18/20  Yes Cantwell, Celeste C, PA-C  aspirin EC 81 MG tablet Take 1 tablet (81 mg total) by mouth daily. 05/10/20 05/10/21 Yes Patwardhan, Manish J, MD  atenolol (TENORMIN) 25 MG tablet TAKE 1 TABLET BY MOUTH ONCE A DAY 08/04/20  Yes Cantwell, Celeste C, PA-C  atorvastatin (LIPITOR) 80 MG tablet Take 80 mg by mouth daily.    Yes [provider]  azelastine (ASTELIN) 0.1 % nasal spray Place 1 spray into both nostrils daily as needed for rhinitis or allergies.  06/24/19 08/04/20 Yes [provider]  clopidogrel (PLAVIX) 75 MG tablet Take 1 tablet (75 mg total) by mouth daily. 05/10/20 05/10/21 Yes Patwardhan, Manish J, MD  ezetimibe (ZETIA) 10 MG tablet Take 1 tablet (10 mg total) by mouth daily. 03/24/20  Yes Adrian Prows, MD  isosorbide mononitrate (IMDUR) 60 MG 24 hr tablet TAKE 1 TABLET BY MOUTH DAILY 07/18/20  Yes Cantwell, Celeste C, PA-C  mometasone-formoterol (DULERA) 100-5 MCG/ACT AERO Take 2 puffs first thing in am and then another 2 puffs about 12 hours later. Patient taking differently: Inhale 2 puffs into the lungs daily.  04/26/17  Yes Tanda Rockers, MD  nitroGLYCERIN (NITROSTAT) 0.4 MG SL tablet Place 1 tablet (0.4 mg total) under the tongue every 5 (five) minutes as needed for chest pain. 10/15/19  Yes Miquel Dunn, NP  pantoprazole (PROTONIX) 40 MG tablet TAKE 1 TABLET BY MOUTH TWICE (2) DAILY Patient taking differently: Take 40 mg by mouth 2 (two) times daily.  06/08/20  Yes Adrian Prows, MD  traZODone (DESYREL) 100 MG tablet Take 100 mg by mouth at bedtime as needed for sleep.  01/04/20  Yes [provider]  venlafaxine XR [facility]  XR) 150 MG 24 hr capsule Take 1 capsule (150 mg total) by mouth daily with breakfast. Patient taking differently: Take 300 mg by mouth daily with breakfast.  11/09/15  Yes  Patel-Mills, Hanna, PA-C  albuterol (PROVENTIL HFA;VENTOLIN HFA) 108 (90 BASE) MCG/ACT inhaler Inhale 2 puffs into the lungs 2 (two) times daily as needed for wheezing or shortness of breath (asthma).     [provider]  gabapentin (NEURONTIN) 400 MG capsule Take 1 capsule (400 mg total) by mouth 2 (two) times daily. Patient takes 400 mg 3 times daily and 1200 mg at bedtime Patient not taking: Reported on 08/04/2020 07/04/14   Lucious Groves, DO  hydrALAZINE (APRESOLINE) 25 MG tablet Take 1 tablet (25 mg total) by mouth 3 (three) times daily. Patient taking differently: Take 50 mg by mouth 3 (three) times daily.  03/24/20 07/18/20  Adrian Prows, MD    Current Facility-Administered Medications  Medication Dose Route Frequency Provider Last Rate Last Admin  . albuterol (VENTOLIN HFA) 108 (90 Base) MCG/ACT inhaler 2 puff  2 puff Inhalation BID PRN Bunnie Pion Z, DO      . ALPRAZolam Duanne Moron) tablet 0.5 mg  0.5 mg Oral BID PRN Bunnie Pion Z, DO   0.5 mg at 08/05/20 0142  . amLODipine (NORVASC) tablet 5 mg  5 mg Oral Daily Bunnie Pion Z, DO      . atenolol (TENORMIN) tablet 25 mg  25 mg Oral Daily Bunnie Pion Z, DO      . atorvastatin (LIPITOR) tablet 80 mg  80 mg Oral Daily Bunnie Pion Z, DO      . isosorbide mononitrate (IMDUR) 24 hr tablet 60 mg  60 mg Oral Daily Khan, Mohammad Z, DO      . mometasone-formoterol (DULERA) 100-5 MCG/ACT inhaler 2 puff  2 puff Inhalation Daily Humphrey Rolls, Mohammad Z, DO      . nitroGLYCERIN (NITROSTAT) SL tablet 0.4 mg  0.4 mg Sublingual Q5 min PRN Bunnie Pion Z, DO      . ondansetron [facility]) tablet 4 mg  4 mg Oral Q6H PRN Bunnie Pion Z, DO       Or  . ondansetron [facility]) injection 4 mg  4 mg Intravenous Q6H PRN Bunnie Pion Z, DO      . pantoprazole (PROTONIX) EC tablet 40 mg  40 mg Oral BID Bunnie Pion Z, DO      . traZODone (DESYREL) tablet 100 mg  100 mg Oral QHS PRN Bunnie Pion Z, DO      . venlafaxine XR (EFFEXOR-XR) 24 hr  capsule 150 mg  150 mg Oral Q breakfast Bunnie Pion Z, DO   150 mg at 08/05/20 6761   Current Outpatient Medications  Medication Sig Dispense Refill  . acetaminophen (TYLENOL) 500 MG tablet Take 1,000 mg by mouth every 8 (eight) hours as needed for mild pain or headache.    . ALPRAZolam (XANAX) 1 MG tablet Take 0.5 tablets by mouth 2 (two) times daily as needed for anxiety.     Marland Kitchen amLODipine (NORVASC) 10 MG tablet Take 0.5 tablets (5 mg total) by mouth daily. 30 tablet 1  . aspirin EC 81 MG tablet Take 1 tablet (81 mg total) by mouth daily. 30 tablet 2  . atenolol (TENORMIN) 25 MG tablet TAKE 1 TABLET BY MOUTH ONCE A DAY 90 tablet 1  . atorvastatin (LIPITOR) 80 MG tablet Take 80 mg by mouth daily.     Marland Kitchen azelastine (ASTELIN) 0.1 % nasal spray Place  1 spray into both nostrils daily as needed for rhinitis or allergies.     Marland Kitchen clopidogrel (PLAVIX) 75 MG tablet Take 1 tablet (75 mg total) by mouth daily. 30 tablet 2  . ezetimibe (ZETIA) 10 MG tablet Take 1 tablet (10 mg total) by mouth daily. 90 tablet 3  . isosorbide mononitrate (IMDUR) 60 MG 24 hr tablet TAKE 1 TABLET BY MOUTH DAILY 90 tablet 3  . mometasone-formoterol (DULERA) 100-5 MCG/ACT AERO Take 2 puffs first thing in am and then another 2 puffs about 12 hours later. (Patient taking differently: Inhale 2 puffs into the lungs daily. ) 1 Inhaler 11  . nitroGLYCERIN (NITROSTAT) 0.4 MG SL tablet Place 1 tablet (0.4 mg total) under the tongue every 5 (five) minutes as needed for chest pain. 30 tablet 1  . pantoprazole (PROTONIX) 40 MG tablet TAKE 1 TABLET BY MOUTH TWICE (2) DAILY (Patient taking differently: Take 40 mg by mouth 2 (two) times daily. ) 180 tablet 2  . traZODone (DESYREL) 100 MG tablet Take 100 mg by mouth at bedtime as needed for sleep.     Marland Kitchen venlafaxine XR (EFFEXOR XR) 150 MG 24 hr capsule Take 1 capsule (150 mg total) by mouth daily with breakfast. (Patient taking differently: Take 300 mg by mouth daily with breakfast. ) 30 capsule  0  . albuterol (PROVENTIL HFA;VENTOLIN HFA) 108 (90 BASE) MCG/ACT inhaler Inhale 2 puffs into the lungs 2 (two) times daily as needed for wheezing or shortness of breath (asthma).     . gabapentin (NEURONTIN) 400 MG capsule Take 1 capsule (400 mg total) by mouth 2 (two) times daily. Patient takes 400 mg 3 times daily and 1200 mg at bedtime (Patient not taking: Reported on 08/04/2020)    . hydrALAZINE (APRESOLINE) 25 MG tablet Take 1 tablet (25 mg total) by mouth 3 (three) times daily. (Patient taking differently: Take 50 mg by mouth 3 (three) times daily. ) 90 tablet 2    Allergies as of 08/04/2020 - Review Complete 08/04/2020  Allergen Reaction Noted  . Doxycycline Anaphylaxis and Hives   . Hydrocodone-acetaminophen Nausea And Vomiting 06/18/2012  . Iohexol Itching 10/16/2011    Family History  Problem Relation Age of Onset  . Other Mother        many bowel obstructions  . Heart disease Mother   . Colon polyps Mother   . Kidney cancer Father   . Bone cancer Father   . Diabetes Father   . Heart disease Father   . Diabetes Daughter   . Colon cancer Paternal Grandfather   . Heart disease Brother   . Esophageal cancer Neg Hx     Social History   Socioeconomic History  . Marital status: Divorced    Spouse name: Not on file  . Number of children: 1  . Years of education: Not on file  . Highest education level: Not on file  Occupational History  . Occupation: Scientist, research (life sciences): UNEMPLOYED  Tobacco Use  . Smoking status: Current Every Day Smoker    Packs/day: 0.25    Years: 40.00    Pack years: 10.00    Types: Cigarettes  . Smokeless tobacco: Never Used  Vaping Use  . Vaping Use: Former  Substance and Sexual Activity  . Alcohol use: No  . Drug use: Yes    Frequency: 3.0 times per week    Types: Marijuana    Comment: last use yesterday  . Sexual activity: Yes  Birth control/protection: Post-menopausal  Other Topics Concern  . Not on file  Social  History Narrative  . Not on file   Social Determinants of Health   Financial Resource Strain:   . Difficulty of Paying Living Expenses: Not on file  Food Insecurity:   . Worried About Charity fundraiser in the Last Year: Not on file  . Ran Out of Food in the Last Year: Not on file  Transportation Needs:   . Lack of Transportation (Medical): Not on file  . Lack of Transportation (Non-Medical): Not on file  Physical Activity:   . Days of Exercise per Week: Not on file  . Minutes of Exercise per Session: Not on file  Stress:   . Feeling of Stress : Not on file  Social Connections:   . Frequency of Communication with Friends and Family: Not on file  . Frequency of Social Gatherings with Friends and Family: Not on file  . Attends Religious Services: Not on file  . Active Member of Clubs or Organizations: Not on file  . Attends Archivist Meetings: Not on file  . Marital Status: Not on file  Intimate Partner Violence:   . Fear of Current or Ex-Partner: Not on file  . Emotionally Abused: Not on file  . Physically Abused: Not on file  . Sexually Abused: Not on file    Review of Systems: All systems reviewed and negative except where noted in HPI.  OBJECTIVE:    Physical Exam: Vital signs in last 24 hours: Temp:  [97.8 F (36.6 C)-98.2 F (36.8 C)] 98 F (36.7 C) (11/05 0800) Pulse Rate:  [66-83] 80 (11/05 0919) Resp:  [10-22] 19 (11/05 0919) BP: (134-186)/(58-109) 181/85 (11/05 0919) SpO2:  [92 %-100 %] 96 % (11/05 0919) Weight:  [46.7 kg] 46.7 kg (11/04 2146)   General:   Alert, thin female in NAD Psych:  Pleasant, cooperative. Normal mood and affect. Eyes:  Pupils equal, sclera clear, no icterus.   Conjunctiva pink. Ears:  Normal auditory acuity. Nose:  No deformity, discharge,  or lesions. Neck:  Supple; no masses Lungs:  Diffuse wheezing.  Heart:  Regular rate and rhythm; no murmurs, no lower extremity edema Abdomen:  Soft, non-distended, nontender, BS  active, no palp mass   Rectal:  No stool or blood in vault  Msk:  Symmetrical without gross deformities. . Neurologic:  Alert and  oriented x4;  grossly normal neurologically. Skin:  Intact without significant lesions or rashes.  Filed Weights   08/04/20 2146  Weight: 46.7 kg     Scheduled inpatient medications . amLODipine  5 mg Oral Daily  . atenolol  25 mg Oral Daily  . atorvastatin  80 mg Oral Daily  . isosorbide mononitrate  60 mg Oral Daily  . mometasone-formoterol  2 puff Inhalation Daily  . pantoprazole  40 mg Oral BID  . venlafaxine XR  150 mg Oral Q breakfast      Intake/Output from previous day: 11/04 0701 - 11/05 0700 In: 315 [Blood:315] Out: -  Intake/Output this shift: Total I/O In: 315 [Blood:315] Out: -    Lab Results: Recent Labs    08/04/20 2211 08/05/20 0757  WBC 9.0  --   HGB 5.0* 8.0*  HCT 17.4* 26.2*  PLT 424*  --    BMET Recent Labs    08/04/20 2211  NA 133*  K 3.9  CL 98  CO2 21*  GLUCOSE 112*  BUN 20  CREATININE 1.83*  CALCIUM  9.5   LFT Recent Labs    08/04/20 2211  PROT 7.7  ALBUMIN 4.0  AST 26  ALT 16  ALKPHOS 105  BILITOT 0.6   PT/INR Recent Labs    08/05/20 0130  LABPROT 13.7  INR 1.1   Hepatitis Panel No results for input(s): HEPBSAG, HCVAB, HEPAIGM, HEPBIGM in the last 72 hours.   . CBC Latest Ref Rng & Units 08/05/2020 08/04/2020 05/10/2020  WBC 4.0 - 10.5 K/uL - 9.0 9.4  Hemoglobin 12.0 - 15.0 g/dL 8.0(L) 5.0(LL) 12.3  Hematocrit 36 - 46 % 26.2(L) 17.4(L) 39.9  Platelets 150 - 400 K/uL - 424(H) 386    . CMP Latest Ref Rng & Units 08/04/2020 06/24/2020 05/11/2020  Glucose 70 - 99 mg/dL 112(H) 116(H) 104(H)  BUN 6 - 20 mg/dL 20 27(H) 20  Creatinine 0.44 - 1.00 mg/dL 1.83(H) 2.41(H) 2.09(H)  Sodium 135 - 145 mmol/L 133(L) 141 137  Potassium 3.5 - 5.1 mmol/L 3.9 5.5(H) 4.8  Chloride 98 - 111 mmol/L 98 108(H) 102  CO2 22 - 32 mmol/L 21(L) 18(L) 26  Calcium 8.9 - 10.3 mg/dL 9.5 9.0 9.5  Total Protein  6.5 - 8.1 g/dL 7.7 - -  Total Bilirubin 0.3 - 1.2 mg/dL 0.6 - -  Alkaline Phos 38 - 126 U/L 105 - -  AST 15 - 41 U/L 26 - -  ALT 0 - 44 U/L 16 - -   Studies/Results: CT Abdomen Pelvis Wo Contrast  Result Date: 08/05/2020 CLINICAL DATA:  Left abdominal pain EXAM: CT ABDOMEN AND PELVIS WITHOUT CONTRAST TECHNIQUE: Multidetector CT imaging of the abdomen and pelvis was performed following the standard protocol without IV contrast. COMPARISON:  None. FINDINGS: Lower chest: Trace left pleural effusion.  Left base atelectasis. Hepatobiliary: No focal hepatic abnormality. Gallbladder unremarkable. Pancreas: No focal abnormality or ductal dilatation. Spleen: No focal abnormality.  Normal size. Adrenals/Urinary Tract: No adrenal abnormality. No focal renal abnormality. No stones or hydronephrosis. Urinary bladder is unremarkable. Stomach/Bowel: Stomach, large and small bowel grossly unremarkable. Vascular/Lymphatic: Diffuse aortoiliac atherosclerosis. No aneurysm or adenopathy. Reproductive: Prior hysterectomy.  No adnexal masses. Other: No free fluid or free air. Musculoskeletal: No acute bony abnormality. IMPRESSION: No acute findings in the abdomen or pelvis. Diffuse vascular atherosclerosis. Trace left pleural effusion with left base atelectasis. Electronically Signed   By: Rolm Baptise M.D.   On: 08/05/2020 00:14   DG Abd Acute W/Chest  Result Date: 08/04/2020 CLINICAL DATA:  Left-sided abdominal pain EXAM: DG ABDOMEN ACUTE WITH 1 VIEW CHEST COMPARISON:  None. FINDINGS: There is no evidence of dilated bowel loops or free intraperitoneal air. No radiopaque calculi or other significant radiographic abnormality is seen. Vascular stents are seen within the mid abdomen. Heart size and mediastinal contours are within normal limits. Both lungs are clear. IMPRESSION: Negative abdominal radiographs.  No acute cardiopulmonary disease. Electronically Signed   By: Prudencio Pair M.D.   On: 08/04/2020 22:41    Principal  Problem:   Acute GI bleeding Active Problems:   Essential hypertension   Cigarette smoker   Renal artery stenosis, native, bilateral ([place])   Acute posthemorrhagic anemia    Tye Savoy, NP-C @  08/05/2020, 9:41 AM   Attending physician's note   I have taken an interval history, reviewed the chart and examined the patient. I agree with the Advanced Practitioner's note, impression and recommendations.   GI bleed- UGI vs LGI. Neg NCCT. Hb 5.0 s/p 2U to Hb 8 Baseline 11).  No active bleeding currently.  Last plavix 11/3  Acute on chronic anemia d/t CKD4 IBS-C H/O perforated diverticulitis s/p sig colectomy 2008. H/O adenomatous colonic polyps 2018 Multiple comorbid conditions including COPD, CAD s/p PCI, PAD s/p renal angioplasty/renal stenting on chronic Plavix, PVD, anxiety/depression, chronic back pain.  Plan: -IV Protonix -Trend CBC. -Proceed with EGD/colonoscopy in a.m.  I discussed risks and benefits. -Hold Plavix for now. -Avoid nonsteroidals. -D/W pt and patient's boyfriend.   Carmell Austria, MD [facility] [phone]

## 2020-08-05 NOTE — ED Notes (Signed)
Patient requesting something for pain. Paged MD

## 2020-08-05 NOTE — H&P (View-Only) (Signed)
Referring Provider:  [facility]         Primary Care Physician:  Bernerd Limbo, MD Primary Gastroenterologist:  Oretha Caprice, MD                We were asked to see this patient for:     GI bleed             ASSESSMENT / PLAN:   #GI bleed on Plavix.  Difficult determine if this is upper versus lower bleed.  In the setting of severe constipation patient passed brown stool with a very small amount of light red blood last week.  After laxatives and enemas she passed a black stool ( without red blood) last night. She hasn't seen any bright red blood other than the small amount last week. BUN is not out of proportion to creatinine.   --Since the origin of GI bleed is not entirely clear, we will proceed with EGD and colonoscopy.  This can be done on Saturday, her last dose of Plavix was Wednesday.  The risks and benefits of EGD and colonoscopy with possible polypectomy / biopsies were discussed and the patient agrees to proceed.  --Clear liquids okay for now --Please hold home Plavix  # Acute on chronic anemia.  Patient has CKD, baseline hemoglobin 11-12 range.  Hemoglobin in ED yesterday in yesterday was 5, improved to 8 post 2 units of blood  # Chronic constipation frequently associated with generalized left-sided abdominal pain.  No acute abnormalities on noncontrast CT scan of the abdomen and pelvis yesterday.  WBC normal.  # History of perforated diverticulitis requiring partial colectomy in 2008  # History of adenomatous colon polyps (2018).  At that time a 3-year surveillance colonoscopy was recommended  # Multiple medical problems as listed below      HPI:                                                                                                                             Chief Complaint:  Constipation, abdominal pain and blood in stool  [name] is a 59 y.o. female with a pmh significant for, not necessarily limited to: Hypertension, COPD, tobacco  dependence, CKD, peripheral artery disease with stent placement in abdominal aorta, CAD with stenting, chronic antiplatelet therapy,  anxiety/depression, diverticulosis, perforated diverticulitis requiring partial colectomy in 2008,  colon polyps, chronic constipation, GERD, appendectomy  Patient has a history of chronic intermittent constipation.  She generally does not take anything at home for constipation except on an as-needed basis.  2 weeks ago she became significantly constipated.  She took laxatives without significant results.  Last week she had a brown stool with a small amount of light red blood.  She took 2 enemas, still without significant improvement.  Last night she did finally have a bowel movement and it was black.  There was no red blood associated with the black stool.  She says the only time  that was red blood was last week and it was a small amount.  Patient takes Plavix.  Though it is listed in the records that she takes warfarin, patient denies this.  Her last Plavix was Wednesday.  Patient has left-sided abdominal pain associated with constipation.  She says this pain has been present intermittently with constipation for at least 6 years.  It generally starts in the LUQ but radiates downward into the LLQ.  Last week with constipation she had some nausea and vomiting of nonbloody emesis.  Her appetite is been diminished over the last couple of weeks.  She explains that the left-sided abdominal pain gets worse with eating  She was taking daily baby asa up until two weeks ago. No other NSAIDS.   PREVIOUS ENDOSCOPIC EVALUATIONS / PERTINENT STUDIES   January 2018 colonoscopy for evaluation of a positive Cologuard study.  -Exam complete with an excellent bowel prep. -2 sessile polyps removed from the descending colon and transverse colon measuring 3 to 4 mm in size -A 10 mm polyp was found in the distal sigmoid colon, removed in piecemeal fashion. -Path compatible with tubular adenomas  plus hyperplastic polyp  August 2018 flexible sigmoidoscopy for follow-up on 10 mm adenoma removed in January 2018 --Small rectal hyperplastic polyp    August 2018 EGD for dysphagia --normal.   Past Medical History:  Diagnosis Date   Anemia 2008   Anxiety    Aortic stenosis    Arthritis    "all my joints ache" (09/07/2014)   Asthma    Bipolar 1 disorder (HCC)    Bradycardia    Bruit    Carpal tunnel syndrome    Cervical cancer ([place]) 1985   CHF (congestive heart failure) ([place])    Chronic kidney disease (CKD), stage V (HCC)    COPD (chronic obstructive pulmonary disease) ([place]) 2000   Coronary artery disease    Daily headache    Depression 2000   Diverticulitis 2008   GERD (gastroesophageal reflux disease)    Glaucoma    Heart murmur    History of colon polyps 2009   HLD (hyperlipidemia) 2013   Hypertension 2013   Hypovitaminosis D    IBS (irritable bowel syndrome) 2008   Myocardial infarction [facility])    2015   PAD (peripheral artery disease) ([place])    Pancreatitis 10/2011   Pneumonia 07/2014   RLS (restless legs syndrome)    Schizophrenia (HCC)    Shortness of breath    Skin cancer    Small bowel obstruction ([place]) 2008    Past Surgical History:  Procedure Laterality Date   ABDOMINAL ANGIOGRAM N/A 09/07/2014   Procedure: ABDOMINAL ANGIOGRAM;  Surgeon: Laverda Page, MD;  Location: [facility] CATH LAB;  Service: Cardiovascular;  Laterality: N/A;   APPENDECTOMY  1996   COLON SURGERY  2008   6 inches of colon removed due to obstruction   COLONOSCOPY WITH PROPOFOL N/A 10/25/2016   Procedure: COLONOSCOPY WITH PROPOFOL;  Surgeon: Milus Banister, MD;  Location: [facility] ENDOSCOPY;  Service: Endoscopy;  Laterality: N/A;   CORONARY ANGIOPLASTY WITH STENT PLACEMENT  09/07/2014   "2"   LEFT HEART CATH AND CORONARY ANGIOGRAPHY N/A 03/25/2018   Procedure: LEFT HEART CATH AND CORONARY ANGIOGRAPHY;  Surgeon: Nigel Mormon, MD;  Location: [place] CV LAB;  Service: Cardiovascular;  Laterality: N/A;   LEFT HEART CATHETERIZATION WITH CORONARY ANGIOGRAM N/A 09/07/2014   Procedure: LEFT HEART CATHETERIZATION WITH CORONARY ANGIOGRAM;  Surgeon: Laverda Page, MD;  Location:  [address] CATH LAB;  Service: Cardiovascular;  Laterality: N/A;   LOWER EXTREMITY ANGIOGRAPHY  10/29/2017   Procedure: Lower Extremity Angiography;  Surgeon: Adrian Prows, MD;  Location: [place] CV LAB;  Service: Cardiovascular;;   PERIPHERAL VASCULAR CATHETERIZATION N/A 11/01/2015   Procedure: Renal Angiography;  Surgeon: Adrian Prows, MD;  Location: [place] CV LAB;  Service: Cardiovascular;  Laterality: N/A;   PERIPHERAL VASCULAR CATHETERIZATION N/A 04/10/2016   Procedure: Renal Angiography;  Surgeon: Adrian Prows, MD;  Location: [place] CV LAB;  Service: Cardiovascular;  Laterality: N/A;   PERIPHERAL VASCULAR CATHETERIZATION  04/10/2016   Procedure: Peripheral Vascular Intervention;  Surgeon: Adrian Prows, MD;  Location: [place] CV LAB;  Service: Cardiovascular;;   PERIPHERAL VASCULAR INTERVENTION  10/29/2017   Procedure: PERIPHERAL VASCULAR INTERVENTION;  Surgeon: Adrian Prows, MD;  Location: [place] CV LAB;  Service: Cardiovascular;;   RENAL ANGIOGRAPHY N/A 10/29/2017   Procedure: RENAL ANGIOGRAPHY;  Surgeon: Adrian Prows, MD;  Location: [place] CV LAB;  Service: Cardiovascular;  Laterality: N/A;   RENAL ANGIOGRAPHY N/A 05/10/2020   Procedure: RENAL ANGIOGRAPHY;  Surgeon: Nigel Mormon, MD;  Location: [place] CV LAB;  Service: Cardiovascular;  Laterality: N/A;   RIGHT OOPHORECTOMY Right [place]    Prior to Admission medications   Medication Sig Start Date End Date Taking? Authorizing Provider  acetaminophen (TYLENOL) 500 MG tablet Take 1,000 mg by mouth every 8 (eight) hours as needed for mild pain or headache.   Yes [provider]  ALPRAZolam Duanne Moron) 1 MG tablet Take 0.5 tablets by mouth 2 (two)  times daily as needed for anxiety.  04/15/17  Yes [provider]  amLODipine (NORVASC) 10 MG tablet Take 0.5 tablets (5 mg total) by mouth daily. 07/18/20  Yes Cantwell, Celeste C, PA-C  aspirin EC 81 MG tablet Take 1 tablet (81 mg total) by mouth daily. 05/10/20 05/10/21 Yes Patwardhan, Manish J, MD  atenolol (TENORMIN) 25 MG tablet TAKE 1 TABLET BY MOUTH ONCE A DAY 08/04/20  Yes Cantwell, Celeste C, PA-C  atorvastatin (LIPITOR) 80 MG tablet Take 80 mg by mouth daily.    Yes [provider]  azelastine (ASTELIN) 0.1 % nasal spray Place 1 spray into both nostrils daily as needed for rhinitis or allergies.  06/24/19 08/04/20 Yes [provider]  clopidogrel (PLAVIX) 75 MG tablet Take 1 tablet (75 mg total) by mouth daily. 05/10/20 05/10/21 Yes Patwardhan, Manish J, MD  ezetimibe (ZETIA) 10 MG tablet Take 1 tablet (10 mg total) by mouth daily. 03/24/20  Yes Adrian Prows, MD  isosorbide mononitrate (IMDUR) 60 MG 24 hr tablet TAKE 1 TABLET BY MOUTH DAILY 07/18/20  Yes Cantwell, Celeste C, PA-C  mometasone-formoterol (DULERA) 100-5 MCG/ACT AERO Take 2 puffs first thing in am and then another 2 puffs about 12 hours later. Patient taking differently: Inhale 2 puffs into the lungs daily.  04/26/17  Yes Tanda Rockers, MD  nitroGLYCERIN (NITROSTAT) 0.4 MG SL tablet Place 1 tablet (0.4 mg total) under the tongue every 5 (five) minutes as needed for chest pain. 10/15/19  Yes Miquel Dunn, NP  pantoprazole (PROTONIX) 40 MG tablet TAKE 1 TABLET BY MOUTH TWICE (2) DAILY Patient taking differently: Take 40 mg by mouth 2 (two) times daily.  06/08/20  Yes Adrian Prows, MD  traZODone (DESYREL) 100 MG tablet Take 100 mg by mouth at bedtime as needed for sleep.  01/04/20  Yes [provider]  venlafaxine XR [facility]  XR) 150 MG 24 hr capsule Take 1 capsule (150 mg total) by mouth daily with breakfast. Patient taking differently: Take 300 mg by mouth daily with breakfast.  11/09/15  Yes  Patel-Mills, Hanna, PA-C  albuterol (PROVENTIL HFA;VENTOLIN HFA) 108 (90 BASE) MCG/ACT inhaler Inhale 2 puffs into the lungs 2 (two) times daily as needed for wheezing or shortness of breath (asthma).     [provider]  gabapentin (NEURONTIN) 400 MG capsule Take 1 capsule (400 mg total) by mouth 2 (two) times daily. Patient takes 400 mg 3 times daily and 1200 mg at bedtime Patient not taking: Reported on 08/04/2020 07/04/14   Lucious Groves, DO  hydrALAZINE (APRESOLINE) 25 MG tablet Take 1 tablet (25 mg total) by mouth 3 (three) times daily. Patient taking differently: Take 50 mg by mouth 3 (three) times daily.  03/24/20 07/18/20  Adrian Prows, MD    Current Facility-Administered Medications  Medication Dose Route Frequency Provider Last Rate Last Admin   albuterol (VENTOLIN HFA) 108 (90 Base) MCG/ACT inhaler 2 puff  2 puff Inhalation BID PRN Bunnie Pion Z, DO       ALPRAZolam Duanne Moron) tablet 0.5 mg  0.5 mg Oral BID PRN Bunnie Pion Z, DO   0.5 mg at 08/05/20 0142   amLODipine (NORVASC) tablet 5 mg  5 mg Oral Daily Bunnie Pion Z, DO       atenolol (TENORMIN) tablet 25 mg  25 mg Oral Daily Khan, Mohammad Z, DO       atorvastatin (LIPITOR) tablet 80 mg  80 mg Oral Daily Khan, Mohammad Z, DO       isosorbide mononitrate (IMDUR) 24 hr tablet 60 mg  60 mg Oral Daily Khan, Mohammad Z, DO       mometasone-formoterol (DULERA) 100-5 MCG/ACT inhaler 2 puff  2 puff Inhalation Daily Humphrey Rolls, Mohammad Z, DO       nitroGLYCERIN (NITROSTAT) SL tablet 0.4 mg  0.4 mg Sublingual Q5 min PRN Bunnie Pion Z, DO       ondansetron [facility]) tablet 4 mg  4 mg Oral Q6H PRN Bunnie Pion Z, DO       Or   ondansetron [facility]) injection 4 mg  4 mg Intravenous Q6H PRN Bunnie Pion Z, DO       pantoprazole (PROTONIX) EC tablet 40 mg  40 mg Oral BID Bunnie Pion Z, DO       traZODone (DESYREL) tablet 100 mg  100 mg Oral QHS PRN Bunnie Pion Z, DO       venlafaxine XR (EFFEXOR-XR) 24 hr  capsule 150 mg  150 mg Oral Q breakfast Bunnie Pion Z, DO   150 mg at 08/05/20 5027   Current Outpatient Medications  Medication Sig Dispense Refill   acetaminophen (TYLENOL) 500 MG tablet Take 1,000 mg by mouth every 8 (eight) hours as needed for mild pain or headache.     ALPRAZolam (XANAX) 1 MG tablet Take 0.5 tablets by mouth 2 (two) times daily as needed for anxiety.      amLODipine (NORVASC) 10 MG tablet Take 0.5 tablets (5 mg total) by mouth daily. 30 tablet 1   aspirin EC 81 MG tablet Take 1 tablet (81 mg total) by mouth daily. 30 tablet 2   atenolol (TENORMIN) 25 MG tablet TAKE 1 TABLET BY MOUTH ONCE A DAY 90 tablet 1   atorvastatin (LIPITOR) 80 MG tablet Take 80 mg by mouth daily.      azelastine (ASTELIN) 0.1 % nasal spray Place  1 spray into both nostrils daily as needed for rhinitis or allergies.      clopidogrel (PLAVIX) 75 MG tablet Take 1 tablet (75 mg total) by mouth daily. 30 tablet 2   ezetimibe (ZETIA) 10 MG tablet Take 1 tablet (10 mg total) by mouth daily. 90 tablet 3   isosorbide mononitrate (IMDUR) 60 MG 24 hr tablet TAKE 1 TABLET BY MOUTH DAILY 90 tablet 3   mometasone-formoterol (DULERA) 100-5 MCG/ACT AERO Take 2 puffs first thing in am and then another 2 puffs about 12 hours later. (Patient taking differently: Inhale 2 puffs into the lungs daily. ) 1 Inhaler 11   nitroGLYCERIN (NITROSTAT) 0.4 MG SL tablet Place 1 tablet (0.4 mg total) under the tongue every 5 (five) minutes as needed for chest pain. 30 tablet 1   pantoprazole (PROTONIX) 40 MG tablet TAKE 1 TABLET BY MOUTH TWICE (2) DAILY (Patient taking differently: Take 40 mg by mouth 2 (two) times daily. ) 180 tablet 2   traZODone (DESYREL) 100 MG tablet Take 100 mg by mouth at bedtime as needed for sleep.      venlafaxine XR (EFFEXOR XR) 150 MG 24 hr capsule Take 1 capsule (150 mg total) by mouth daily with breakfast. (Patient taking differently: Take 300 mg by mouth daily with breakfast. ) 30 capsule  0   albuterol (PROVENTIL HFA;VENTOLIN HFA) 108 (90 BASE) MCG/ACT inhaler Inhale 2 puffs into the lungs 2 (two) times daily as needed for wheezing or shortness of breath (asthma).      gabapentin (NEURONTIN) 400 MG capsule Take 1 capsule (400 mg total) by mouth 2 (two) times daily. Patient takes 400 mg 3 times daily and 1200 mg at bedtime (Patient not taking: Reported on 08/04/2020)     hydrALAZINE (APRESOLINE) 25 MG tablet Take 1 tablet (25 mg total) by mouth 3 (three) times daily. (Patient taking differently: Take 50 mg by mouth 3 (three) times daily. ) 90 tablet 2    Allergies as of 08/04/2020 - Review Complete 08/04/2020  Allergen Reaction Noted   Doxycycline Anaphylaxis and Hives    Hydrocodone-acetaminophen Nausea And Vomiting 06/18/2012   Iohexol Itching 10/16/2011    Family History  Problem Relation Age of Onset   Other Mother        many bowel obstructions   Heart disease Mother    Colon polyps Mother    Kidney cancer Father    Bone cancer Father    Diabetes Father    Heart disease Father    Diabetes Daughter    Colon cancer Paternal Grandfather    Heart disease Brother    Esophageal cancer Neg Hx     Social History   Socioeconomic History   Marital status: Divorced    Spouse name: Not on file   Number of children: 1   Years of education: Not on file   Highest education level: Not on file  Occupational History   Occupation: Scientist, research (life sciences): UNEMPLOYED  Tobacco Use   Smoking status: Current Every Day Smoker    Packs/day: 0.25    Years: 40.00    Pack years: 10.00    Types: Cigarettes   Smokeless tobacco: Never Used  Scientific laboratory technician Use: Former  Substance and Sexual Activity   Alcohol use: No   Drug use: Yes    Frequency: 3.0 times per week    Types: Marijuana    Comment: last use yesterday   Sexual activity: Yes  Birth control/protection: Post-menopausal  Other Topics Concern   Not on file  Social  History Narrative   Not on file   Social Determinants of Health   Financial Resource Strain:    Difficulty of Paying Living Expenses: Not on file  Food Insecurity:    Worried About Charity fundraiser in the Last Year: Not on file   [employer] of Food in the Last Year: Not on file  Transportation Needs:    Lack of Transportation (Medical): Not on file   Lack of Transportation (Non-Medical): Not on file  Physical Activity:    Days of Exercise per Week: Not on file   Minutes of Exercise per Session: Not on file  Stress:    Feeling of Stress : Not on file  Social Connections:    Frequency of Communication with Friends and Family: Not on file   Frequency of Social Gatherings with Friends and Family: Not on file   Attends Religious Services: Not on file   Active Member of Clubs or Organizations: Not on file   Attends Archivist Meetings: Not on file   Marital Status: Not on file  Intimate Partner Violence:    Fear of Current or Ex-Partner: Not on file   Emotionally Abused: Not on file   Physically Abused: Not on file   Sexually Abused: Not on file    Review of Systems: All systems reviewed and negative except where noted in HPI.  OBJECTIVE:    Physical Exam: Vital signs in last 24 hours: Temp:  [97.8 F (36.6 C)-98.2 F (36.8 C)] 98 F (36.7 C) (11/05 0800) Pulse Rate:  [66-83] 80 (11/05 0919) Resp:  [10-22] 19 (11/05 0919) BP: (134-186)/(58-109) 181/85 (11/05 0919) SpO2:  [92 %-100 %] 96 % (11/05 0919) Weight:  [46.7 kg] 46.7 kg (11/04 2146)   General:   Alert, thin female in NAD Psych:  Pleasant, cooperative. Normal mood and affect. Eyes:  Pupils equal, sclera clear, no icterus.   Conjunctiva pink. Ears:  Normal auditory acuity. Nose:  No deformity, discharge,  or lesions. Neck:  Supple; no masses Lungs:  Diffuse wheezing.  Heart:  Regular rate and rhythm; no murmurs, no lower extremity edema Abdomen:  Soft, non-distended, nontender, BS  active, no palp mass   Rectal:  No stool or blood in vault  Msk:  Symmetrical without gross deformities. . Neurologic:  Alert and  oriented x4;  grossly normal neurologically. Skin:  Intact without significant lesions or rashes.  Filed Weights   08/04/20 2146  Weight: 46.7 kg     Scheduled inpatient medications  amLODipine  5 mg Oral Daily   atenolol  25 mg Oral Daily   atorvastatin  80 mg Oral Daily   isosorbide mononitrate  60 mg Oral Daily   mometasone-formoterol  2 puff Inhalation Daily   pantoprazole  40 mg Oral BID   venlafaxine XR  150 mg Oral Q breakfast      Intake/Output from previous day: 11/04 0701 - 11/05 0700 In: 315 [Blood:315] Out: -  Intake/Output this shift: Total I/O In: 315 [Blood:315] Out: -    Lab Results: Recent Labs    08/04/20 2211 08/05/20 0757  WBC 9.0  --   HGB 5.0* 8.0*  HCT 17.4* 26.2*  PLT 424*  --    BMET Recent Labs    08/04/20 2211  NA 133*  K 3.9  CL 98  CO2 21*  GLUCOSE 112*  BUN 20  CREATININE 1.83*  CALCIUM  9.5   LFT Recent Labs    08/04/20 2211  PROT 7.7  ALBUMIN 4.0  AST 26  ALT 16  ALKPHOS 105  BILITOT 0.6   PT/INR Recent Labs    08/05/20 0130  LABPROT 13.7  INR 1.1   Hepatitis Panel No results for input(s): HEPBSAG, HCVAB, HEPAIGM, HEPBIGM in the last 72 hours.   . CBC Latest Ref Rng & Units 08/05/2020 08/04/2020 05/10/2020  WBC 4.0 - 10.5 K/uL - 9.0 9.4  Hemoglobin 12.0 - 15.0 g/dL 8.0(L) 5.0(LL) 12.3  Hematocrit 36 - 46 % 26.2(L) 17.4(L) 39.9  Platelets 150 - 400 K/uL - 424(H) 386    . CMP Latest Ref Rng & Units 08/04/2020 06/24/2020 05/11/2020  Glucose 70 - 99 mg/dL 112(H) 116(H) 104(H)  BUN 6 - 20 mg/dL 20 27(H) 20  Creatinine 0.44 - 1.00 mg/dL 1.83(H) 2.41(H) 2.09(H)  Sodium 135 - 145 mmol/L 133(L) 141 137  Potassium 3.5 - 5.1 mmol/L 3.9 5.5(H) 4.8  Chloride 98 - 111 mmol/L 98 108(H) 102  CO2 22 - 32 mmol/L 21(L) 18(L) 26  Calcium 8.9 - 10.3 mg/dL 9.5 9.0 9.5  Total Protein  6.5 - 8.1 g/dL 7.7 - -  Total Bilirubin 0.3 - 1.2 mg/dL 0.6 - -  Alkaline Phos 38 - 126 U/L 105 - -  AST 15 - 41 U/L 26 - -  ALT 0 - 44 U/L 16 - -   Studies/Results: CT Abdomen Pelvis Wo Contrast  Result Date: 08/05/2020 CLINICAL DATA:  Left abdominal pain EXAM: CT ABDOMEN AND PELVIS WITHOUT CONTRAST TECHNIQUE: Multidetector CT imaging of the abdomen and pelvis was performed following the standard protocol without IV contrast. COMPARISON:  None. FINDINGS: Lower chest: Trace left pleural effusion.  Left base atelectasis. Hepatobiliary: No focal hepatic abnormality. Gallbladder unremarkable. Pancreas: No focal abnormality or ductal dilatation. Spleen: No focal abnormality.  Normal size. Adrenals/Urinary Tract: No adrenal abnormality. No focal renal abnormality. No stones or hydronephrosis. Urinary bladder is unremarkable. Stomach/Bowel: Stomach, large and small bowel grossly unremarkable. Vascular/Lymphatic: Diffuse aortoiliac atherosclerosis. No aneurysm or adenopathy. Reproductive: Prior hysterectomy.  No adnexal masses. Other: No free fluid or free air. Musculoskeletal: No acute bony abnormality. IMPRESSION: No acute findings in the abdomen or pelvis. Diffuse vascular atherosclerosis. Trace left pleural effusion with left base atelectasis. Electronically Signed   By: Rolm Baptise M.D.   On: 08/05/2020 00:14   DG Abd Acute W/Chest  Result Date: 08/04/2020 CLINICAL DATA:  Left-sided abdominal pain EXAM: DG ABDOMEN ACUTE WITH 1 VIEW CHEST COMPARISON:  None. FINDINGS: There is no evidence of dilated bowel loops or free intraperitoneal air. No radiopaque calculi or other significant radiographic abnormality is seen. Vascular stents are seen within the mid abdomen. Heart size and mediastinal contours are within normal limits. Both lungs are clear. IMPRESSION: Negative abdominal radiographs.  No acute cardiopulmonary disease. Electronically Signed   By: Prudencio Pair M.D.   On: 08/04/2020 22:41    Principal  Problem:   Acute GI bleeding Active Problems:   Essential hypertension   Cigarette smoker   Renal artery stenosis, native, bilateral ([place])   Acute posthemorrhagic anemia    Tye Savoy, NP-C @  08/05/2020, 9:41 AM   Attending physician's note   I have taken an interval history, reviewed the chart and examined the patient. I agree with the Advanced Practitioner's note, impression and recommendations.   GI bleed- UGI vs LGI. Neg NCCT. Hb 5.0 s/p 2U to Hb 8 Baseline 11).  No active bleeding currently.  Last plavix 11/3  Acute on chronic anemia d/t CKD4 IBS-C H/O perforated diverticulitis s/p sig colectomy 2008. H/O adenomatous colonic polyps 2018 Multiple comorbid conditions including COPD, CAD s/p PCI, PAD s/p renal angioplasty/renal stenting on chronic Plavix, PVD, anxiety/depression, chronic back pain.  Plan: -IV Protonix -Trend CBC. -Proceed with EGD/colonoscopy in a.m.  I discussed risks and benefits. -Hold Plavix for now. -Avoid nonsteroidals. -D/W pt and patient's boyfriend.   Carmell Austria, MD [facility] [phone]

## 2020-08-05 NOTE — ED Notes (Signed)
Pt ambulated without assistance to restroom

## 2020-08-05 NOTE — Progress Notes (Signed)
PROGRESS NOTE    Blaine Guiffre Ruscitti  TIR:[id number] DOB: Dec 19, 1960 DOA: 08/04/2020 PCP: Bernerd Limbo, MD    Brief Narrative:  59 y.o. female with medical history significant of resistant hypertension, COPD, CKD, bilateral renal artery angioplasty, peripheral arterial disease with stent placement in abdominal aorta, anxiety/depression, tobacco dependence, diverticulosis and colon resection presented to ED complaining of pain in her left lower abdominal quadrant and no bowel movement for the last 4 days.  Patient mentioned that she noticed melena followed by bright red per rectum for multiple times during the last week but did not have any more bleeding for the last 4 days because she had no bowel movement.  Patient is also complaining of severe generalized weakness and nausea but no vomiting.  Patient otherwise denies fever, chills, chest pain, shortness of breath, hematemesis, urinary frequency, hematuria, vaginal bleeding and swelling in bilateral lower extremities.  Patient further mentioned that she is on Plavix and warfarin because of abdominal aortic stents in the setting of worsening peripheral arterial disease  On arrival to ED patient had blood pressure of 185/83, heart rate 83, temperature 98.1, respiratory rate 16 and oxygen saturation 98% on room air.  Blood work showed WBC of 9 and hemoglobin 0, sodium 133, potassium 3.9, BUN 20, creatinine 1.83.  Digital rectal exam showed no stool in the rectum and no melena or bright red blood.  As the patient is on warfarin therefore INR ordered.  CT scan abdomen/pelvis was negative for acute pathology.  ED physician ordered blood transfusion and also contacted GI  Assessment & Plan:   Principal Problem:   Acute GI bleeding Active Problems:   Essential hypertension   Cigarette smoker   Renal artery stenosis, native, bilateral (HCC)   Acute posthemorrhagic anemia  Principal Problem:   Acute GI bleeding with acute blood loss  anemia Patient reported melena and hematochezia during last week and found to have hemoglobin of 5.   No active bleeding was appreciated at the time of presentation Pt required 2 units PRBCs in the ED GI consulted, appreciate recs. Plan for endoscopy and colonoscopy tomorrow Holding aspirin, Plavix and warfarin at this time.   Repeat cbc in AM Continue with IV protonix  Active Problems:   Essential hypertension Patient states that she has a history of poorly controlled hypertension and cardiologist recently added hydralazine to her blood pressure medications but she is not taking hydralazine because it gives her bad feelings.   -Continue home medications.  IV hydralazine 10 mg every 6 hours as needed -Repeat cmp in AM  Hyperlipidemia Continue home statin.  Patient previously had statin related myalgia and cardiology recommended to hold statin for 4 weeks.   Currently without myalgias at this time.  Anxiety/depression Remains stable at this time  COPD Not in acute exacerbation.  On minimal O2 support at this time. No wheezing.    Cigarette smoker Tobacco cessation counseling done at time of presentation  Chronic kidney disease Current Cr appears near baseline Will repeat bmet in AM    Renal artery stenosis, native, bilateral ([place]) Follows up with cardiology  Abdominal aortic stenosis status post stenting Patient follows up with cardiology on regular basis  DVT prophylaxis: SCD's Code Status: Full Family Communication: Pt in room, family not at bedside  Status is: Observation  The patient remains OBS appropriate and will d/c before 2 midnights.  Dispo: The patient is from: Home              Anticipated d/c is  to: Home              Anticipated d/c date is: 2 days              Patient currently is not medically stable to d/c.   Consultants:   GI  Procedures:     Antimicrobials: Anti-infectives (From admission, onward)   None        Subjective: Complain  Objective: Vitals:   08/05/20 1200 08/05/20 1300 08/05/20 1400 08/05/20 1459  BP: (!) 155/70 (!) 148/78 (!) 171/78 (!) 156/75  Pulse: 74 69 64 (!) 57  Resp: _0 (!) 21  Temp:   98 F (36.7 C) 99.2 F (37.3 C)  TempSrc:    Oral  SpO2: 100% 100% 99% 93%  Weight:      Height:    _1  (1.575 m)    Intake/Output Summary (Last 24 hours) at 08/05/2020 1650 Last data filed at 08/05/2020 1007 Gross per 24 hour  Intake 670 ml  Output --  Net 670 ml   Filed Weights   08/04/20 2146  Weight: 46.7 kg    Examination:  General exam: Appears calm and comfortable  Respiratory system: Clear to auscultation. Respiratory effort normal. Cardiovascular system: S1 & S2 heard, Regular Gastrointestinal system: Abdomen is nondistended, soft and nontender. No organomegaly or masses felt. Normal bowel sounds heard. Central nervous system: Alert and oriented. No focal neurological deficits. Extremities: Symmetric 5 x 5 power. Skin: No rashes, lesions Psychiatry: Judgement and insight appear normal. Mood & affect appropriate.   Data Reviewed: I have personally reviewed following labs and imaging studies  CBC: Recent Labs  Lab 08/04/20 2211 08/05/20 0757  WBC 9.0  --   NEUTROABS 6.6  --   HGB 5.0* 8.0*  HCT 17.4* 26.2*  MCV 82.5  --   PLT 424*  --    Basic Metabolic Panel: Recent Labs  Lab 08/04/20 2211  NA 133*  K 3.9  CL 98  CO2 21*  GLUCOSE 112*  BUN 20  CREATININE 1.83*  CALCIUM 9.5   GFR: Estimated Creatinine Clearance: 25 mL/min (A) (by C-G formula based on SCr of 1.83 mg/dL (H)). Liver Function Tests: Recent Labs  Lab 08/04/20 2211  AST 26  ALT 16  ALKPHOS 105  BILITOT 0.6  PROT 7.7  ALBUMIN 4.0   Recent Labs  Lab 08/04/20 2211  LIPASE 39   No results for input(s): AMMONIA in the last 168 hours. Coagulation Profile: Recent Labs  Lab 08/05/20 0130  INR 1.1   Cardiac Enzymes: No results for input(s): CKTOTAL, CKMB,  CKMBINDEX, TROPONINI in the last 168 hours. BNP (last 3 results) No results for input(s): PROBNP in the last 8760 hours. HbA1C: No results for input(s): HGBA1C in the last 72 hours. CBG: No results for input(s): GLUCAP in the last 168 hours. Lipid Profile: No results for input(s): CHOL, HDL, LDLCALC, TRIG, CHOLHDL, LDLDIRECT in the last 72 hours. Thyroid Function Tests: No results for input(s): TSH, T4TOTAL, FREET4, T3FREE, THYROIDAB in the last 72 hours. Anemia Panel: No results for input(s): VITAMINB12, FOLATE, FERRITIN, TIBC, IRON, RETICCTPCT in the last 72 hours. Sepsis Labs: No results for input(s): PROCALCITON, LATICACIDVEN in the last 168 hours.  Recent Results (from the past 240 hour(s))  Respiratory Panel by RT PCR (Flu A&B, Covid) - Nasopharyngeal Swab     Status: None   Collection Time: 08/04/20 11:13 PM   Specimen: Nasopharyngeal Swab  Result Value Ref Range Status   SARS  Coronavirus 2 by RT PCR NEGATIVE NEGATIVE Final    Comment: (NOTE) SARS-CoV-2 target nucleic acids are NOT DETECTED.  The SARS-CoV-2 RNA is generally detectable in upper respiratoy specimens during the acute phase of infection. The lowest concentration of SARS-CoV-2 viral copies this assay can detect is 131 copies/mL. A negative result does not preclude SARS-Cov-2 infection and should not be used as the sole basis for treatment or other patient management decisions. A negative result may occur with  improper specimen collection/handling, submission of specimen other than nasopharyngeal swab, presence of viral mutation(s) within the areas targeted by this assay, and inadequate number of viral copies (<131 copies/mL). A negative result must be combined with clinical observations, patient history, and epidemiological information. The expected result is Negative.  Fact Sheet for Patients:  PinkCheek.be  Fact Sheet for Healthcare Providers:   GravelBags.it  This test is no t yet approved or cleared by the [place] FDA and  has been authorized for detection and/or diagnosis of SARS-CoV-2 by FDA under an Emergency Use Authorization (EUA). This EUA will remain  in effect (meaning this test can be used) for the duration of the COVID-19 declaration under Section 564(b)(1) of the Act, 21 U.S.C. section 360bbb-3(b)(1), unless the authorization is terminated or revoked sooner.     Influenza A by PCR NEGATIVE NEGATIVE Final   Influenza B by PCR NEGATIVE NEGATIVE Final    Comment: (NOTE) The Xpert Xpress SARS-CoV-2/FLU/RSV assay is intended as an aid in  the diagnosis of influenza from Nasopharyngeal swab specimens and  should not be used as a sole basis for treatment. Nasal washings and  aspirates are unacceptable for Xpert Xpress SARS-CoV-2/FLU/RSV  testing.  Fact Sheet for Patients: PinkCheek.be  Fact Sheet for Healthcare Providers: GravelBags.it  This test is not yet approved or cleared by the [place] FDA and  has been authorized for detection and/or diagnosis of SARS-CoV-2 by  FDA under an Emergency Use Authorization (EUA). This EUA will remain  in effect (meaning this test can be used) for the duration of the  Covid-19 declaration under Section 564(b)(1) of the Act, 21  U.S.C. section 360bbb-3(b)(1), unless the authorization is  terminated or revoked. Performed at [facility], [place] [address], [place], [place] [address]      Radiology Studies: CT Abdomen Pelvis Wo Contrast  Result Date: 08/05/2020 CLINICAL DATA:  Left abdominal pain EXAM: CT ABDOMEN AND PELVIS WITHOUT CONTRAST TECHNIQUE: Multidetector CT imaging of the abdomen and pelvis was performed following the standard protocol without IV contrast. COMPARISON:  None. FINDINGS: Lower chest: Trace left pleural effusion.  Left base atelectasis.  Hepatobiliary: No focal hepatic abnormality. Gallbladder unremarkable. Pancreas: No focal abnormality or ductal dilatation. Spleen: No focal abnormality.  Normal size. Adrenals/Urinary Tract: No adrenal abnormality. No focal renal abnormality. No stones or hydronephrosis. Urinary bladder is unremarkable. Stomach/Bowel: Stomach, large and small bowel grossly unremarkable. Vascular/Lymphatic: Diffuse aortoiliac atherosclerosis. No aneurysm or adenopathy. Reproductive: Prior hysterectomy.  No adnexal masses. Other: No free fluid or free air. Musculoskeletal: No acute bony abnormality. IMPRESSION: No acute findings in the abdomen or pelvis. Diffuse vascular atherosclerosis. Trace left pleural effusion with left base atelectasis. Electronically Signed   By: Rolm Baptise M.D.   On: 08/05/2020 00:14   DG Abd Acute W/Chest  Result Date: 08/04/2020 CLINICAL DATA:  Left-sided abdominal pain EXAM: DG ABDOMEN ACUTE WITH 1 VIEW CHEST COMPARISON:  None. FINDINGS: There is no evidence of dilated bowel loops or free intraperitoneal air. No radiopaque calculi  or other significant radiographic abnormality is seen. Vascular stents are seen within the mid abdomen. Heart size and mediastinal contours are within normal limits. Both lungs are clear. IMPRESSION: Negative abdominal radiographs.  No acute cardiopulmonary disease. Electronically Signed   By: Prudencio Pair M.D.   On: 08/04/2020 22:41    Scheduled Meds: . [START ON 08/06/2020] amLODipine  10 mg Oral Daily  . atenolol  25 mg Oral Daily  . atorvastatin  80 mg Oral Daily  . isosorbide mononitrate  60 mg Oral Daily  . mometasone-formoterol  2 puff Inhalation BID  . pantoprazole  40 mg Oral BID  . peg 3350 powder  0.5 kit Oral BID  . venlafaxine XR  150 mg Oral Q breakfast   Continuous Infusions:   LOS: 0 days   Marylu Lund, MD [facility] Pager On Amion  If 7PM-7AM, please contact night-coverage 08/05/2020, 4:50 PM

## 2020-08-05 NOTE — ED Notes (Signed)
Pt requesting xanax 

## 2020-08-05 NOTE — H&P (Signed)
History and Physical    [name] EQA:[id number] DOB: Feb 08, 1961 DOA: 08/04/2020  PCP: Bernerd Limbo, MD (Confirm with patient/family/NH records and if not entered, this has to be entered at [facility] point of entry) Patient coming from: Home  I have personally briefly reviewed patient's old medical records in [place]  Chief Complaint: Abdominal pain ,generalized weakness  HPI: [name] is a 59 y.o. female with medical history significant of resistant hypertension, COPD, CKD, bilateral renal artery angioplasty, peripheral arterial disease with stent placement in abdominal aorta, anxiety/depression, tobacco dependence, diverticulosis and colon resection presented to ED complaining of pain in her left lower abdominal quadrant and no bowel movement for the last 4 days.  Patient mentioned that she noticed melena followed by bright red per rectum for multiple times during the last week but did not have any more bleeding for the last 4 days because she had no bowel movement.  Patient is also complaining of severe generalized weakness and nausea but no vomiting.  Patient otherwise denies fever, chills, chest pain, shortness of breath, hematemesis, urinary frequency, hematuria, vaginal bleeding and swelling in bilateral lower extremities.  Patient further mentioned that she is on Plavix and warfarin because of abdominal aortic stents in the setting of worsening peripheral arterial disease.  Patient admits of smoking 1 pack of cigarettes daily but denies call and illicit drug use.  (For level 3, the HPI must include 4+ descriptors: Location, Quality, Severity, Duration, Timing, Context, modifying factors, associated signs/symptoms and/or status of 3+ chronic problems.)  (Please avoid self-populating past medical history here) (The initial 2-3 lines should be focused and good to copy and paste in the HPI section of the daily progress note).  ED Course: On arrival to ED patient  had blood pressure of 185/83, heart rate 83, temperature 98.1, respiratory rate 16 and oxygen saturation 98% on room air.  Blood work showed WBC of 9 and hemoglobin 0, sodium 133, potassium 3.9, BUN 20, creatinine 1.83.  Digital rectal exam showed no stool in the rectum and no melena or bright red blood.  As the patient is on warfarin therefore INR ordered.  CT scan abdomen/pelvis was negative for acute pathology.  ED physician ordered blood transfusion and also contacted GI.  Review of Systems: As per HPI otherwise 10 point review of systems negative.  Unacceptable ROS statements: "10 systems reviewed," "Extensive" (without elaboration).  Acceptable ROS statements: "All others negative," "All others reviewed and are negative," and "All others unremarkable," with at [place] documented Can't double dip - if using for HPI can't use for ROS  Past Medical History:  Diagnosis Date  . Anemia 2008  . Anxiety   . Aortic stenosis   . Arthritis    "all my joints ache" (09/07/2014)  . Asthma   . Bipolar 1 disorder ([place])   . Bradycardia   . Bruit   . Carpal tunnel syndrome   . Cervical cancer ([place]) 1985  . CHF (congestive heart failure) ([place])   . Chronic kidney disease (CKD), stage V ([place])   . COPD (chronic obstructive pulmonary disease) ([place]) 2000  . Coronary artery disease   . Daily headache   . Depression 2000  . Diverticulitis 2008  . GERD (gastroesophageal reflux disease)   . Glaucoma   . Heart murmur   . History of colon polyps 2009  . HLD (hyperlipidemia) 2013  . Hypertension 2013  . Hypovitaminosis D   . IBS (irritable bowel syndrome) 2008  .  Myocardial infarction ([place])    2015  . PAD (peripheral artery disease) ([place])   . Pancreatitis 10/2011  . Pneumonia 07/2014  . RLS (restless legs syndrome)   . Schizophrenia ([place])   . Shortness of breath   . Skin cancer   . Small bowel obstruction ([place]) 2008    Past Surgical History:  Procedure Laterality Date  . ABDOMINAL  ANGIOGRAM N/A 09/07/2014   Procedure: ABDOMINAL ANGIOGRAM;  Surgeon: Laverda Page, MD;  Location: [facility] CATH LAB;  Service: Cardiovascular;  Laterality: N/A;  . APPENDECTOMY  1996  . COLON SURGERY  2008   6 inches of colon removed due to obstruction  . COLONOSCOPY WITH PROPOFOL N/A 10/25/2016   Procedure: COLONOSCOPY WITH PROPOFOL;  Surgeon: Milus Banister, MD;  Location: [facility] ENDOSCOPY;  Service: Endoscopy;  Laterality: N/A;  . CORONARY ANGIOPLASTY WITH STENT PLACEMENT  09/07/2014   "2"  . LEFT HEART CATH AND CORONARY ANGIOGRAPHY N/A 03/25/2018   Procedure: LEFT HEART CATH AND CORONARY ANGIOGRAPHY;  Surgeon: Nigel Mormon, MD;  Location: [place] CV LAB;  Service: Cardiovascular;  Laterality: N/A;  . LEFT HEART CATHETERIZATION WITH CORONARY ANGIOGRAM N/A 09/07/2014   Procedure: LEFT HEART CATHETERIZATION WITH CORONARY ANGIOGRAM;  Surgeon: Laverda Page, MD;  Location: [facility] C[facility] LAB;  Service: Cardiovascular;  Laterality: N/A;  . LOWER EXTREMITY ANGIOGRAPHY  10/29/2017   Procedure: Lower Extremity Angiography;  Surgeon: Adrian Prows, MD;  Location: [place] CV LAB;  Service: Cardiovascular;;  . PERIPHERAL VASCULAR CATHETERIZATION N/A 11/01/2015   Procedure: Renal Angiography;  Surgeon: Adrian Prows, MD;  Location: [place] CV LAB;  Service: Cardiovascular;  Laterality: N/A;  . PERIPHERAL VASCULAR CATHETERIZATION N/A 04/10/2016   Procedure: Renal Angiography;  Surgeon: Adrian Prows, MD;  Location: [place] CV LAB;  Service: Cardiovascular;  Laterality: N/A;  . PERIPHERAL VASCULAR CATHETERIZATION  04/10/2016   Procedure: Peripheral Vascular Intervention;  Surgeon: Adrian Prows, MD;  Location: [place] CV LAB;  Service: Cardiovascular;;  . PERIPHERAL VASCULAR INTERVENTION  10/29/2017   Procedure: PERIPHERAL VASCULAR INTERVENTION;  Surgeon: Adrian Prows, MD;  Location: [place] CV LAB;  Service: Cardiovascular;;  . RENAL ANGIOGRAPHY N/A 10/29/2017   Procedure: RENAL ANGIOGRAPHY;  Surgeon:  Adrian Prows, MD;  Location: [place] CV LAB;  Service: Cardiovascular;  Laterality: N/A;  . RENAL ANGIOGRAPHY N/A 05/10/2020   Procedure: RENAL ANGIOGRAPHY;  Surgeon: Nigel Mormon, MD;  Location: [place] CV LAB;  Service: Cardiovascular;  Laterality: N/A;  . RIGHT OOPHORECTOMY Right 1996  . TOTAL ABDOMINAL HYSTERECTOMY  1997     reports that she has been smoking cigarettes. She has a 10.00 pack-year smoking history. She has never used smokeless tobacco. She reports current drug use. Frequency: 3.00 times per week. Drug: Marijuana. She reports that she does not drink alcohol.  Allergies  Allergen Reactions  . Doxycycline Anaphylaxis and Hives  . Hydrocodone-Acetaminophen Nausea And Vomiting  . Iohexol Itching    Pt has itching nose after iv contrast injection    Family History  Problem Relation Age of Onset  . Other Mother        many bowel obstructions  . Heart disease Mother   . Colon polyps Mother   . Kidney cancer Father   . Bone cancer Father   . Diabetes Father   . Heart disease Father   . Diabetes Daughter   . Colon cancer Paternal Grandfather   . Heart disease Brother   . Esophageal cancer Neg Hx  Unacceptable: Noncontributory, unremarkable, or negative. Acceptable: (example)Family history negative for heart disease  Prior to Admission medications   Medication Sig Start Date End Date Taking? Authorizing Provider  acetaminophen (TYLENOL) 500 MG tablet Take 1,000 mg by mouth every 8 (eight) hours as needed for mild pain or headache.   Yes [provider]  ALPRAZolam Duanne Moron) 1 MG tablet Take 0.5 tablets by mouth 2 (two) times daily as needed for anxiety.  04/15/17  Yes [provider]  amLODipine (NORVASC) 10 MG tablet Take 0.5 tablets (5 mg total) by mouth daily. 07/18/20  Yes Cantwell, Celeste C, PA-C  aspirin EC 81 MG tablet Take 1 tablet (81 mg total) by mouth daily. 05/10/20 05/10/21 Yes Patwardhan, Manish J, MD  atenolol (TENORMIN) 25  MG tablet TAKE 1 TABLET BY MOUTH ONCE A DAY 08/04/20  Yes Cantwell, Celeste C, PA-C  atorvastatin (LIPITOR) 80 MG tablet Take 80 mg by mouth daily.    Yes [provider]  azelastine (ASTELIN) 0.1 % nasal spray Place 1 spray into both nostrils daily as needed for rhinitis or allergies.  06/24/19 08/04/20 Yes [provider]  clopidogrel (PLAVIX) 75 MG tablet Take 1 tablet (75 mg total) by mouth daily. 05/10/20 05/10/21 Yes Patwardhan, Manish J, MD  ezetimibe (ZETIA) 10 MG tablet Take 1 tablet (10 mg total) by mouth daily. 03/24/20  Yes Adrian Prows, MD  isosorbide mononitrate (IMDUR) 60 MG 24 hr tablet TAKE 1 TABLET BY MOUTH DAILY 07/18/20  Yes Cantwell, Celeste C, PA-C  mometasone-formoterol (DULERA) 100-5 MCG/ACT AERO Take 2 puffs first thing in am and then another 2 puffs about 12 hours later. Patient taking differently: Inhale 2 puffs into the lungs daily.  04/26/17  Yes Tanda Rockers, MD  nitroGLYCERIN (NITROSTAT) 0.4 MG SL tablet Place 1 tablet (0.4 mg total) under the tongue every 5 (five) minutes as needed for chest pain. 10/15/19  Yes Miquel Dunn, NP  pantoprazole (PROTONIX) 40 MG tablet TAKE 1 TABLET BY MOUTH TWICE (2) DAILY Patient taking differently: Take 40 mg by mouth 2 (two) times daily.  06/08/20  Yes Adrian Prows, MD  traZODone (DESYREL) 100 MG tablet Take 100 mg by mouth at bedtime as needed for sleep.  01/04/20  Yes [provider]  venlafaxine XR (EFFEXOR XR) 150 MG 24 hr capsule Take 1 capsule (150 mg total) by mouth daily with breakfast. Patient taking differently: Take 300 mg by mouth daily with breakfast.  11/09/15  Yes Patel-Mills, Hanna, PA-C  albuterol (PROVENTIL HFA;VENTOLIN HFA) 108 (90 BASE) MCG/ACT inhaler Inhale 2 puffs into the lungs 2 (two) times daily as needed for wheezing or shortness of breath (asthma).     [provider]  gabapentin (NEURONTIN) 400 MG capsule Take 1 capsule (400 mg total) by mouth 2 (two) times daily. Patient takes  400 mg 3 times daily and 1200 mg at bedtime Patient not taking: Reported on 08/04/2020 07/04/14   Lucious Groves, DO  hydrALAZINE (APRESOLINE) 25 MG tablet Take 1 tablet (25 mg total) by mouth 3 (three) times daily. Patient taking differently: Take 50 mg by mouth 3 (three) times daily.  03/24/20 07/18/20  Adrian Prows, MD    Physical Exam: Vitals:   08/05/20 0045 08/05/20 0115 08/05/20 0130 08/05/20 0145  BP: (!) 146/71 (!) 150/109 (!) 158/93 (!) 171/71  Pulse: 68 67 69 68  Resp: 13 20 16 12   Temp:   97.8 F (36.6 C)   TempSrc:   Oral   SpO2: 99%  100% 95% 98%  Weight:      Height:        Constitutional: NAD, calm, comfortable Vitals:   08/05/20 0045 08/05/20 0115 08/05/20 0130 08/05/20 0145  BP: (!) 146/71 (!) 150/109 (!) 158/93 (!) 171/71  Pulse: 68 67 69 68  Resp: 13 20 16 12   Temp:   97.8 F (36.6 C)   TempSrc:   Oral   SpO2: 99% 100% 95% 98%  Weight:      Height:        General: 59 year old Caucasian female who looks older than the stated age not in acute distress. Eyes: PERRL, lids and conjunctivae normal ENMT: Mucous membranes are moist. Posterior pharynx clear of any exudate or lesions.Normal dentition.  Neck: normal, supple, no masses, no thyromegaly Respiratory: clear to auscultation bilaterally, no wheezing, no crackles. Normal respiratory effort. No accessory muscle use.  Cardiovascular: Regular rate and rhythm, no murmurs / rubs / gallops. No extremity edema. 2+ pedal pulses. No carotid bruits.  Abdomen: Mild tenderness in epigastric region and left lower quadrant, no masses palpated. No hepatosplenomegaly. Bowel sounds positive.  Musculoskeletal: no clubbing / cyanosis. No joint deformity upper and lower extremities. Good ROM, no contractures. Normal muscle tone.  Skin: no rashes, lesions, ulcers. No induration Neurologic: CN 2-12 grossly intact. Sensation intact, DTR normal. Strength 5/5 in all 4.  Psychiatric: Normal judgment and insight. Alert and oriented x 3.  Normal mood.   (Anything < 9 systems with 2 bullets each down codes to level 1) (If patient refuses exam can't bill higher level) (Make sure to document decubitus ulcers present on admission -- if possible -- and whether patient has chronic indwelling catheter at time of admission)  Labs on Admission: I have personally reviewed following labs and imaging studies  CBC: Recent Labs  Lab 08/04/20 2211  WBC 9.0  NEUTROABS 6.6  HGB 5.0*  HCT 17.4*  MCV 82.5  PLT 740*   Basic Metabolic Panel: Recent Labs  Lab 08/04/20 2211  NA 133*  K 3.9  CL 98  CO2 21*  GLUCOSE 112*  BUN 20  CREATININE 1.83*  CALCIUM 9.5   GFR: Estimated Creatinine Clearance: 25 mL/min (A) (by C-G formula based on SCr of 1.83 mg/dL (H)). Liver Function Tests: Recent Labs  Lab 08/04/20 2211  AST 26  ALT 16  ALKPHOS 105  BILITOT 0.6  PROT 7.7  ALBUMIN 4.0   Recent Labs  Lab 08/04/20 2211  LIPASE 39   No results for input(s): AMMONIA in the last 168 hours. Coagulation Profile: No results for input(s): INR, PROTIME in the last 168 hours. Cardiac Enzymes: No results for input(s): CKTOTAL, CKMB, CKMBINDEX, TROPONINI in the last 168 hours. BNP (last 3 results) No results for input(s): PROBNP in the last 8760 hours. HbA1C: No results for input(s): HGBA1C in the last 72 hours. CBG: No results for input(s): GLUCAP in the last 168 hours. Lipid Profile: No results for input(s): CHOL, HDL, LDLCALC, TRIG, CHOLHDL, LDLDIRECT in the last 72 hours. Thyroid Function Tests: No results for input(s): TSH, T4TOTAL, FREET4, T3FREE, THYROIDAB in the last 72 hours. Anemia Panel: No results for input(s): VITAMINB12, FOLATE, FERRITIN, TIBC, IRON, RETICCTPCT in the last 72 hours. Urine analysis:    Component Value Date/Time   COLORURINE YELLOW 11/01/2015 0100   APPEARANCEUR CLEAR 11/01/2015 0100   APPEARANCEUR Hazy 06/07/2012 1350   LABSPEC 1.008 11/01/2015 0100   LABSPEC 1.016 06/07/2012 1350   PHURINE 6.5  11/01/2015 0100   GLUCOSEU NEGATIVE  11/01/2015 0100   GLUCOSEU Negative 06/07/2012 1350   HGBUR TRACE (A) 11/01/2015 0100   BILIRUBINUR NEGATIVE 11/01/2015 0100   BILIRUBINUR Negative 06/07/2012 1350   KETONESUR NEGATIVE 11/01/2015 0100   PROTEINUR >300 (A) 11/01/2015 0100   UROBILINOGEN 0.2 06/28/2014 1518   NITRITE NEGATIVE 11/01/2015 0100   LEUKOCYTESUR NEGATIVE 11/01/2015 0100   LEUKOCYTESUR Negative 06/07/2012 1350    Radiological Exams on Admission: CT Abdomen Pelvis Wo Contrast  Result Date: 08/05/2020 CLINICAL DATA:  Left abdominal pain EXAM: CT ABDOMEN AND PELVIS WITHOUT CONTRAST TECHNIQUE: Multidetector CT imaging of the abdomen and pelvis was performed following the standard protocol without IV contrast. COMPARISON:  None. FINDINGS: Lower chest: Trace left pleural effusion.  Left base atelectasis. Hepatobiliary: No focal hepatic abnormality. Gallbladder unremarkable. Pancreas: No focal abnormality or ductal dilatation. Spleen: No focal abnormality.  Normal size. Adrenals/Urinary Tract: No adrenal abnormality. No focal renal abnormality. No stones or hydronephrosis. Urinary bladder is unremarkable. Stomach/Bowel: Stomach, large and small bowel grossly unremarkable. Vascular/Lymphatic: Diffuse aortoiliac atherosclerosis. No aneurysm or adenopathy. Reproductive: Prior hysterectomy.  No adnexal masses. Other: No free fluid or free air. Musculoskeletal: No acute bony abnormality. IMPRESSION: No acute findings in the abdomen or pelvis. Diffuse vascular atherosclerosis. Trace left pleural effusion with left base atelectasis. Electronically Signed   By: Rolm Baptise M.D.   On: 08/05/2020 00:14   DG Abd Acute W/Chest  Result Date: 08/04/2020 CLINICAL DATA:  Left-sided abdominal pain EXAM: DG ABDOMEN ACUTE WITH 1 VIEW CHEST COMPARISON:  None. FINDINGS: There is no evidence of dilated bowel loops or free intraperitoneal air. No radiopaque calculi or other significant radiographic abnormality is  seen. Vascular stents are seen within the mid abdomen. Heart size and mediastinal contours are within normal limits. Both lungs are clear. IMPRESSION: Negative abdominal radiographs.  No acute cardiopulmonary disease. Electronically Signed   By: Prudencio Pair M.D.   On: 08/04/2020 22:41      Assessment/Plan Principal Problem:   Acute GI bleeding Patient reported melena and hematochezia during last week and found to have hemoglobin of 5.  No active bleeding noted at this time and digital rectal exam was negative.  2 units of packed red blood cells for transfusion ordered.  GI contacted by the ED physician and will appreciate their recommendations.  Holding aspirin, Plavix and warfarin at this time.  Continue to monitor CBC after blood transfusion is complete.  Continue home Protonix 40 mg twice daily.  Continuous telemetry monitoring.  Active Problems:  Acute anemia secondary to GI bleeding See above    Essential hypertension Patient states that she has a history of poorly controlled hypertension and cardiologist recently added hydralazine to her blood pressure medications but she is not taking hydralazine because it gives her bad feelings.  Continue home medications.  IV hydralazine 10 mg every 6 hours as needed if systolic blood pressure is above 606 or diastolic above 301.  Hyperlipidemia Continue home statin.  Patient previously had statin related myalgia and cardiology recommended to hold statin for 4 weeks.  No myalgias at this time.  Anxiety/depression Stable.  Continue home medications  COPD Not in acute exacerbation.  Continue home medications.     Cigarette smoker Tobacco cessation counseling done.  Chronic kidney disease Creatinine level is at baseline    Renal artery stenosis, native, bilateral ([place]) Follows up with cardiology  Abdominal aortic stenosis status post stenting Patient follows up with cardiology on regular basis      (please populate well all problems  here in Problem List. (For example, if patient is on BP meds at home and you resume or decide to hold them, it is a problem that needs to be her. Same for CAD, COPD, HLD and so on)     DVT prophylaxis: SCDs Code Status: Full code Family Communication: No family member present at the bedside Disposition Plan:  Consults called: Gastroenterology consulted by ED physician Admission status: Observation/telemetry   Edmonia Lynch MD [facility] Pager [phone]   If 7PM-7AM, please contact night-coverage [email or web address] Password [email or web address]  08/05/2020, 1:58 AM

## 2020-08-06 ENCOUNTER — Observation Stay (HOSPITAL_COMMUNITY): Payer: Medicare Other | Admitting: Certified Registered Nurse Anesthetist

## 2020-08-06 ENCOUNTER — Encounter (HOSPITAL_COMMUNITY): Payer: Self-pay | Admitting: Internal Medicine

## 2020-08-06 ENCOUNTER — Encounter (HOSPITAL_COMMUNITY)
Admission: EM | Disposition: A | Discharge status: Home / Self Care | Payer: Self-pay | Source: Home / Self Care | Attending: Internal Medicine

## 2020-08-06 DIAGNOSIS — I739 Peripheral vascular disease, unspecified: Secondary | ICD-10-CM | POA: Diagnosis present

## 2020-08-06 DIAGNOSIS — F17210 Nicotine dependence, cigarettes, uncomplicated: Secondary | ICD-10-CM

## 2020-08-06 DIAGNOSIS — K5520 Angiodysplasia of colon without hemorrhage: Secondary | ICD-10-CM | POA: Diagnosis not present

## 2020-08-06 DIAGNOSIS — Z20822 Contact with and (suspected) exposure to covid-19: Secondary | ICD-10-CM | POA: Diagnosis present

## 2020-08-06 DIAGNOSIS — I2510 Atherosclerotic heart disease of native coronary artery without angina pectoris: Secondary | ICD-10-CM | POA: Diagnosis present

## 2020-08-06 DIAGNOSIS — F419 Anxiety disorder, unspecified: Secondary | ICD-10-CM | POA: Diagnosis present

## 2020-08-06 DIAGNOSIS — K5521 Angiodysplasia of colon with hemorrhage: Secondary | ICD-10-CM | POA: Diagnosis present

## 2020-08-06 DIAGNOSIS — K5909 Other constipation: Secondary | ICD-10-CM | POA: Diagnosis present

## 2020-08-06 DIAGNOSIS — K5730 Diverticulosis of large intestine without perforation or abscess without bleeding: Secondary | ICD-10-CM | POA: Diagnosis present

## 2020-08-06 DIAGNOSIS — I120 Hypertensive chronic kidney disease with stage 5 chronic kidney disease or end stage renal disease: Secondary | ICD-10-CM | POA: Diagnosis present

## 2020-08-06 DIAGNOSIS — K581 Irritable bowel syndrome with constipation: Secondary | ICD-10-CM | POA: Diagnosis present

## 2020-08-06 DIAGNOSIS — J449 Chronic obstructive pulmonary disease, unspecified: Secondary | ICD-10-CM | POA: Diagnosis present

## 2020-08-06 DIAGNOSIS — I701 Atherosclerosis of renal artery: Secondary | ICD-10-CM | POA: Diagnosis present

## 2020-08-06 DIAGNOSIS — G2581 Restless legs syndrome: Secondary | ICD-10-CM | POA: Diagnosis present

## 2020-08-06 DIAGNOSIS — F319 Bipolar disorder, unspecified: Secondary | ICD-10-CM | POA: Diagnosis present

## 2020-08-06 DIAGNOSIS — E785 Hyperlipidemia, unspecified: Secondary | ICD-10-CM | POA: Diagnosis present

## 2020-08-06 DIAGNOSIS — K635 Polyp of colon: Secondary | ICD-10-CM | POA: Diagnosis present

## 2020-08-06 DIAGNOSIS — F209 Schizophrenia, unspecified: Secondary | ICD-10-CM | POA: Diagnosis present

## 2020-08-06 DIAGNOSIS — K219 Gastro-esophageal reflux disease without esophagitis: Secondary | ICD-10-CM | POA: Diagnosis present

## 2020-08-06 DIAGNOSIS — H409 Unspecified glaucoma: Secondary | ICD-10-CM | POA: Diagnosis present

## 2020-08-06 DIAGNOSIS — I252 Old myocardial infarction: Secondary | ICD-10-CM | POA: Diagnosis not present

## 2020-08-06 DIAGNOSIS — D638 Anemia in other chronic diseases classified elsewhere: Secondary | ICD-10-CM | POA: Diagnosis present

## 2020-08-06 DIAGNOSIS — K922 Gastrointestinal hemorrhage, unspecified: Secondary | ICD-10-CM | POA: Diagnosis present

## 2020-08-06 DIAGNOSIS — D62 Acute posthemorrhagic anemia: Secondary | ICD-10-CM | POA: Diagnosis present

## 2020-08-06 DIAGNOSIS — N185 Chronic kidney disease, stage 5: Secondary | ICD-10-CM | POA: Diagnosis present

## 2020-08-06 DIAGNOSIS — D631 Anemia in chronic kidney disease: Secondary | ICD-10-CM | POA: Diagnosis present

## 2020-08-06 HISTORY — PX: ESOPHAGOGASTRODUODENOSCOPY (EGD) WITH PROPOFOL: SHX5813

## 2020-08-06 HISTORY — PX: BIOPSY: SHX5522

## 2020-08-06 LAB — BPAM RBC
Blood Product Expiration Date: 202112112359
Blood Product Expiration Date: 202112112359
ISSUE DATE / TIME: 202111050119
ISSUE DATE / TIME: 202111050427
Unit Type and Rh: 9500
Unit Type and Rh: 9500

## 2020-08-06 LAB — COMPREHENSIVE METABOLIC PANEL
ALT: 16 U/L (ref 0–44)
AST: 24 U/L (ref 15–41)
Albumin: 3.8 g/dL (ref 3.5–5.0)
Alkaline Phosphatase: 95 U/L (ref 38–126)
Anion gap: 15 (ref 5–15)
BUN: 16 mg/dL (ref 6–20)
CO2: 25 mmol/L (ref 22–32)
Calcium: 9.1 mg/dL (ref 8.9–10.3)
Chloride: 97 mmol/L — ABNORMAL LOW (ref 98–111)
Creatinine, Ser: 1.8 mg/dL — ABNORMAL HIGH (ref 0.44–1.00)
GFR, Estimated: 32 mL/min — ABNORMAL LOW (ref >60)
Glucose, Bld: 90 mg/dL (ref 70–99)
Potassium: 3.2 mmol/L — ABNORMAL LOW (ref 3.5–5.1)
Sodium: 137 mmol/L (ref 135–145)
Total Bilirubin: 1 mg/dL (ref 0.3–1.2)
Total Protein: 6.9 g/dL (ref 6.5–8.1)

## 2020-08-06 LAB — TYPE AND SCREEN
ABO/RH(D): O NEG
Antibody Screen: NEGATIVE
Unit division: 0
Unit division: 0

## 2020-08-06 LAB — CBC
HCT: 27.4 % — ABNORMAL LOW (ref 36.0–46.0)
Hemoglobin: 8.2 g/dL — ABNORMAL LOW (ref 12.0–15.0)
MCH: 25.3 pg — ABNORMAL LOW (ref 26.0–34.0)
MCHC: 29.9 g/dL — ABNORMAL LOW (ref 30.0–36.0)
MCV: 84.6 fL (ref 80.0–100.0)
Platelets: 287 10*3/uL (ref 150–400)
RBC: 3.24 MIL/uL — ABNORMAL LOW (ref 3.87–5.11)
RDW: 16.6 % — ABNORMAL HIGH (ref 11.5–15.5)
WBC: 6.9 10*3/uL (ref 4.0–10.5)
nRBC: 0.3 % — ABNORMAL HIGH (ref 0.0–0.2)

## 2020-08-06 LAB — SURGICAL PCR SCREEN
MRSA, PCR: NEGATIVE
Staphylococcus aureus: NEGATIVE

## 2020-08-06 SURGERY — ESOPHAGOGASTRODUODENOSCOPY (EGD) WITH PROPOFOL
Anesthesia: Monitor Anesthesia Care

## 2020-08-06 MED ORDER — PROPOFOL 500 MG/50ML IV EMUL
INTRAVENOUS | Status: DC | PRN
  Administered 2020-08-06: 200 ug/kg/min via INTRAVENOUS

## 2020-08-06 MED ORDER — LIDOCAINE 2% (20 MG/ML) 5 ML SYRINGE
INTRAMUSCULAR | Status: DC | PRN
  Administered 2020-08-06: 80 mg via INTRAVENOUS

## 2020-08-06 MED ORDER — PROMETHAZINE HCL 25 MG/ML IJ SOLN
12.5000 mg | Freq: Once | INTRAMUSCULAR | Status: AC
  Administered 2020-08-06: 12.5 mg via INTRAVENOUS
  Filled 2020-08-06: qty 1

## 2020-08-06 MED ORDER — SODIUM CHLORIDE 0.9 % IV SOLN: INTRAVENOUS | Status: DC | PRN

## 2020-08-06 MED ORDER — POLYETHYLENE GLYCOL 3350 17 GM/SCOOP PO POWD
1.0000 | Freq: Once | ORAL | Status: AC
  Administered 2020-08-06: 255 g via ORAL
  Filled 2020-08-06: qty 255

## 2020-08-06 MED ORDER — ONDANSETRON HCL 4 MG/2ML IJ SOLN
INTRAMUSCULAR | Status: DC | PRN
  Administered 2020-08-06: 4 mg via INTRAVENOUS

## 2020-08-06 MED ORDER — PROPOFOL 10 MG/ML IV BOLUS
INTRAVENOUS | Status: AC
  Filled 2020-08-06: qty 20

## 2020-08-06 MED ORDER — FENTANYL CITRATE (PF) 100 MCG/2ML IJ SOLN
INTRAMUSCULAR | Status: AC
  Filled 2020-08-06: qty 2

## 2020-08-06 MED ORDER — POTASSIUM CHLORIDE CRYS ER 20 MEQ PO TBCR: 40.0000 meq | EXTENDED_RELEASE_TABLET | Freq: Two times a day (BID) | ORAL | Status: DC

## 2020-08-06 MED ORDER — NICOTINE 14 MG/24HR TD PT24
14.0000 mg | MEDICATED_PATCH | TRANSDERMAL | Status: DC
  Administered 2020-08-06: 14 mg via TRANSDERMAL
  Filled 2020-08-06: qty 1

## 2020-08-06 MED ORDER — PROPOFOL 500 MG/50ML IV EMUL
INTRAVENOUS | Status: AC
  Filled 2020-08-06: qty 50

## 2020-08-06 MED ORDER — PROPOFOL 10 MG/ML IV BOLUS
INTRAVENOUS | Status: DC | PRN
  Administered 2020-08-06: 20 mg via INTRAVENOUS
  Administered 2020-08-06: 10 mg via INTRAVENOUS
  Administered 2020-08-06: 40 mg via INTRAVENOUS

## 2020-08-06 MED ORDER — FENTANYL CITRATE (PF) 100 MCG/2ML IJ SOLN
INTRAMUSCULAR | Status: DC | PRN
  Administered 2020-08-06: 50 ug via INTRAVENOUS

## 2020-08-06 MED ORDER — POTASSIUM CHLORIDE CRYS ER 20 MEQ PO TBCR
60.0000 meq | EXTENDED_RELEASE_TABLET | Freq: Once | ORAL | Status: AC
  Administered 2020-08-06: 60 meq via ORAL
  Filled 2020-08-06: qty 3

## 2020-08-06 SURGICAL SUPPLY — 25 items

## 2020-08-06 NOTE — Op Note (Signed)
[facility] Patient Name: [name] Procedure Date: 08/06/2020 MRN: [id number] Attending MD: Jackquline Denmark , MD Date of Birth: [birth year] CSN: [id number] Age: 59 Admit Type: Inpatient Procedure:                Upper GI endoscopy Indications:              Gastrointestinal bleeding of unknown origin Providers:                Jackquline Denmark, MD, Doristine Johns, RN, Laverda Sorenson, Technician, Adair Laundry, CRNA Referring MD:              Medicines:                Monitored Anesthesia Care Complications:            No immediate complications. Estimated Blood Loss:     Estimated blood loss: none. Procedure:                Pre-Anesthesia Assessment:                           - Prior to the procedure, a History and Physical                            was performed, and patient medications and                            allergies were reviewed. [name] tolerance of                            previous anesthesia was also reviewed. The risks                            and benefits of the procedure and the sedation                            options and risks were discussed with the patient.                            All questions were answered, and informed consent                            was obtained. Prior Anticoagulants: The patient has                            taken no previous anticoagulant or antiplatelet                            agents. ASA Grade Assessment: III - A patient with                            severe systemic disease. After reviewing the risks  and benefits, the patient was deemed in                            satisfactory condition to undergo the procedure.                           After obtaining informed consent, the endoscope was                            passed under direct vision. Throughout the                            procedure, the patient's blood pressure, pulse, and                             oxygen saturations were monitored continuously. The                            GIF-H190 ([id number]) Olympus gastroscope was                            introduced through the mouth, and advanced to the                            second part of duodenum. The upper GI endoscopy was                            accomplished without difficulty. The patient                            tolerated the procedure well. Scope In: Scope Out: Findings:      The examined esophagus was normal with well defined Z-line at 38 cm.      Food (residue) was found in the gastric body without outlet obstruction.       Gastric mucosa was normal.      The examined duodenum was normal. Biopsies for histology were taken with       a cold forceps to r/o celiac disease. Impression:               - Food (residue) in the stomach with outlet                            obstruction.                           - Otherwise normal EGD. No UGI bleeding. Moderate Sedation:      Not Applicable - Patient had care per Anesthesia. Recommendation:           - Return patient to hospital ward for ongoing care.                           - Clear liquid diet. Reprep with miralax for colon                            in AM.                           -  Continue present medications.                           - Await pathology results. Procedure Code(s):        --- Professional ---                           [phone], Esophagogastroduodenoscopy, flexible,                            transoral; with biopsy, single or multiple Diagnosis Code(s):        --- Professional ---                           K92.2, Gastrointestinal hemorrhage, unspecified CPT copyright 2019 American Medical Association. All rights reserved. The codes documented in this report are preliminary and upon coder review may  be revised to meet current compliance requirements. Jackquline Denmark, MD 08/06/2020 8:29:28 AM This report has been signed electronically. Number of  Addenda: 0

## 2020-08-06 NOTE — Transfer of Care (Signed)
Immediate Anesthesia Transfer of Care Note  Patient: [name]  Procedure(s) Performed: ESOPHAGOGASTRODUODENOSCOPY (EGD) WITH PROPOFOL (N/A ) BIOPSY  Patient Location: PACU  Anesthesia Type:MAC  Level of Consciousness: drowsy and patient cooperative  Airway & Oxygen Therapy: Patient Spontanous Breathing and Patient connected to nasal cannula oxygen  Post-op Assessment: Report given to RN and Post -op Vital signs reviewed and stable  Post vital signs: Reviewed and stable  Last Vitals:  Vitals Value Taken Time  BP 204/88 08/06/20 0832  Temp    Pulse 78 08/06/20 0833  Resp 14 08/06/20 0833  SpO2 100 % 08/06/20 0833  Vitals shown include unvalidated device data.  Last Pain:  Vitals:   08/06/20 0726  TempSrc: Oral  PainSc: 8       Patients Stated Pain Goal: 3 (19/14/78 2956)  Complications: No complications documented.

## 2020-08-06 NOTE — Interval H&P Note (Signed)
History and Physical Interval Note:  08/06/2020 8:08 AM  [name]  has presented today for surgery, with the diagnosis of gastrointestinal bleeding.  The various methods of treatment have been discussed with the patient and family. After consideration of risks, benefits and other options for treatment, the patient has consented to  Procedure(s): ESOPHAGOGASTRODUODENOSCOPY (EGD) WITH PROPOFOL (N/A) as a surgical intervention.  [name] history has been reviewed, patient examined, no change in status, stable for surgery.  I have reviewed the patient's chart and labs.  Questions were answered to the patient's satisfaction.     Jackquline Denmark

## 2020-08-06 NOTE — Anesthesia Preprocedure Evaluation (Signed)
Anesthesia Evaluation  Patient identified by MRN, date of birth, ID band Patient awake    Reviewed: Allergy & Precautions, NPO status , Patient's Chart, lab work & pertinent test results  Airway Mallampati: I  TM Distance: >3 FB Neck ROM: Full    Dental   Pulmonary Current Smoker,    Pulmonary exam normal        Cardiovascular hypertension, Pt. on medications + CAD, + Past MI and + Cardiac Stents  Normal cardiovascular exam     Neuro/Psych Anxiety Depression Bipolar Disorder Schizophrenia    GI/Hepatic GERD  Medicated and Controlled,  Endo/Other    Renal/GU Renal InsufficiencyRenal disease     Musculoskeletal   Abdominal   Peds  Hematology   Anesthesia Other Findings   Reproductive/Obstetrics                             Anesthesia Physical Anesthesia Plan  ASA: III  Anesthesia Plan: MAC   Post-op Pain Management:    Induction: Intravenous  PONV Risk Score and Plan: 1 and Treatment may vary due to age or medical condition  Airway Management Planned: Nasal Cannula  Additional Equipment:   Intra-op Plan:   Post-operative Plan:   Informed Consent: I have reviewed the patients History and Physical, chart, labs and discussed the procedure including the risks, benefits and alternatives for the proposed anesthesia with the patient or authorized representative who has indicated his/her understanding and acceptance.       Plan Discussed with: CRNA and Surgeon  Anesthesia Plan Comments:         Anesthesia Quick Evaluation

## 2020-08-06 NOTE — Progress Notes (Signed)
PROGRESS NOTE    [name]  XBM:[id number] DOB: 31-Aug-1961 DOA: 08/04/2020 PCP: Bernerd Limbo, MD    Brief Narrative:  59 y.o. female with medical history significant of resistant hypertension, COPD, CKD, bilateral renal artery angioplasty, peripheral arterial disease with stent placement in abdominal aorta, anxiety/depression, tobacco dependence, diverticulosis and colon resection presented to ED complaining of pain in her left lower abdominal quadrant and no bowel movement for the last 4 days.  Patient mentioned that she noticed melena followed by bright red per rectum for multiple times during the last week but did not have any more bleeding for the last 4 days because she had no bowel movement.  Patient is also complaining of severe generalized weakness and nausea but no vomiting.  Patient otherwise denies fever, chills, chest pain, shortness of breath, hematemesis, urinary frequency, hematuria, vaginal bleeding and swelling in bilateral lower extremities.  Patient further mentioned that she is on Plavix and warfarin because of abdominal aortic stents in the setting of worsening peripheral arterial disease  On arrival to ED patient had blood pressure of 185/83, heart rate 83, temperature 98.1, respiratory rate 16 and oxygen saturation 98% on room air.  Blood work showed WBC of 9 and hemoglobin 0, sodium 133, potassium 3.9, BUN 20, creatinine 1.83.  Digital rectal exam showed no stool in the rectum and no melena or bright red blood.  As the patient is on warfarin therefore INR ordered.  CT scan abdomen/pelvis was negative for acute pathology.  ED physician ordered blood transfusion and also contacted GI  Assessment & Plan:   Principal Problem:   Acute GI bleeding Active Problems:   Essential hypertension   Cigarette smoker   Renal artery stenosis, native, bilateral (HCC)   Acute posthemorrhagic anemia   GI bleed  Principal Problem:   Acute GI bleeding with acute blood loss  anemia Patient reported melena and hematochezia during last week and found to have hemoglobin of 5.   No active bleeding was appreciated at the time of presentation Pt required 2 units PRBCs in the ED GI consulted, appreciate recs.  Pt is now s/p unremarkable EGD done 11/6 Pt is planned for colonoscopy 11/7 per GI Holding aspirin, Plavix and warfarin at this time.   Repeat cbc in AM Continue with IV protonix  Active Problems:   Essential hypertension Patient states that she has a history of poorly controlled hypertension and cardiologist recently added hydralazine to her blood pressure medications but she is not taking hydralazine because it gives her bad feelings.   -Continue home medications.  IV hydralazine 10 mg every 6 hours as needed -BP stable, albeit suboptimally controlled -Will cont current regimen for now  Hyperlipidemia Continue home statin.  Patient previously had statin related myalgia and cardiology recommended to hold statin for 4 weeks.   Currently without myalgias at this time.  Anxiety/depression Remains stable at this time  COPD Not in acute exacerbation.  On minimal O2 support at this time. No wheezing.    Cigarette smoker Tobacco cessation counseling done at time of presentation  Chronic kidney disease Current Cr appears near baseline Will recheck bmet in AM    Renal artery stenosis, native, bilateral ([place]) Follows up with cardiology after d/c  Abdominal aortic stenosis status post stenting Patient follows up with cardiology on regular basis  DVT prophylaxis: SCD's Code Status: Full Family Communication: Pt in room, family not at bedside  Status is: Observation  The patient will require care spanning > 2 midnights and  should be moved to inpatient because: Ongoing diagnostic testing needed not appropriate for outpatient work up and Unsafe d/c plan  Dispo: The patient is from: Home              Anticipated d/c is to: Home               Anticipated d/c date is: 2 days              Patient currently is not medically stable to d/c.   Consultants:   GI  Procedures:   EGD 11/6  Antimicrobials: Anti-infectives (From admission, onward)   None      Subjective: Without complaints at this time. Denies abd pain  Objective: Vitals:   08/06/20 0832 08/06/20 0843 08/06/20 0909 08/06/20 1303  BP: (!) 204/88 (!) 187/89 (!) 196/88 (!) 152/76  Pulse: 78 78 71 70  Resp: 17 16 16 16   Temp: 98.1 F (36.7 C)  (!) 97.3 F (36.3 C) 98.6 F (37 C)  TempSrc: Axillary   Oral  SpO2: 100% 95% 100% 91%  Weight:      Height:        Intake/Output Summary (Last 24 hours) at 08/06/2020 1538 Last data filed at 08/06/2020 1305 Gross per 24 hour  Intake 1240 ml  Output --  Net 1240 ml   Filed Weights   08/04/20 2146  Weight: 46.7 kg    Examination: General exam: Awake, laying in bed, in nad Respiratory system: Normal respiratory effort, no wheezing Cardiovascular system: regular rate, s1, s2 Gastrointestinal system: Soft, nondistended, positive BS Central nervous system: CN2-12 grossly intact, strength intact Extremities: Perfused, no clubbing Skin: Normal skin turgor, no notable skin lesions seen Psychiatry: Mood normal // no visual hallucinations   Data Reviewed: I have personally reviewed following labs and imaging studies  CBC: Recent Labs  Lab 08/04/20 2211 08/05/20 0757 08/06/20 0701  WBC 9.0  --  6.9  NEUTROABS 6.6  --   --   HGB 5.0* 8.0* 8.2*  HCT 17.4* 26.2* 27.4*  MCV 82.5  --  84.6  PLT 424*  --  272   Basic Metabolic Panel: Recent Labs  Lab 08/04/20 2211 08/06/20 0701  NA 133* 137  K 3.9 3.2*  CL 98 97*  CO2 21* 25  GLUCOSE 112* 90  BUN 20 16  CREATININE 1.83* 1.80*  CALCIUM 9.5 9.1   GFR: Estimated Creatinine Clearance: 25.4 mL/min (A) (by C-G formula based on SCr of 1.8 mg/dL (H)). Liver Function Tests: Recent Labs  Lab 08/04/20 2211 08/06/20 0701  AST 26 24  ALT 16 16   ALKPHOS 105 95  BILITOT 0.6 1.0  PROT 7.7 6.9  ALBUMIN 4.0 3.8   Recent Labs  Lab 08/04/20 2211  LIPASE 39   No results for input(s): AMMONIA in the last 168 hours. Coagulation Profile: Recent Labs  Lab 08/05/20 0130  INR 1.1   Cardiac Enzymes: No results for input(s): CKTOTAL, CKMB, CKMBINDEX, TROPONINI in the last 168 hours. BNP (last 3 results) No results for input(s): PROBNP in the last 8760 hours. HbA1C: No results for input(s): HGBA1C in the last 72 hours. CBG: No results for input(s): GLUCAP in the last 168 hours. Lipid Profile: No results for input(s): CHOL, HDL, LDLCALC, TRIG, CHOLHDL, LDLDIRECT in the last 72 hours. Thyroid Function Tests: No results for input(s): TSH, T4TOTAL, FREET4, T3FREE, THYROIDAB in the last 72 hours. Anemia Panel: No results for input(s): VITAMINB12, FOLATE, FERRITIN, TIBC, IRON, RETICCTPCT in the last  72 hours. Sepsis Labs: No results for input(s): PROCALCITON, LATICACIDVEN in the last 168 hours.  Recent Results (from the past 240 hour(s))  Respiratory Panel by RT PCR (Flu A&B, Covid) - Nasopharyngeal Swab     Status: None   Collection Time: 08/04/20 11:13 PM   Specimen: Nasopharyngeal Swab  Result Value Ref Range Status   SARS Coronavirus 2 by RT PCR NEGATIVE NEGATIVE Final    Comment: (NOTE) SARS-CoV-2 target nucleic acids are NOT DETECTED.  The SARS-CoV-2 RNA is generally detectable in upper respiratoy specimens during the acute phase of infection. The lowest concentration of SARS-CoV-2 viral copies this assay can detect is 131 copies/mL. A negative result does not preclude SARS-Cov-2 infection and should not be used as the sole basis for treatment or other patient management decisions. A negative result may occur with  improper specimen collection/handling, submission of specimen other than nasopharyngeal swab, presence of viral mutation(s) within the areas targeted by this assay, and inadequate number of viral copies (<131  copies/mL). A negative result must be combined with clinical observations, patient history, and epidemiological information. The expected result is Negative.  Fact Sheet for Patients:  PinkCheek.be  Fact Sheet for Healthcare Providers:  GravelBags.it  This test is no t yet approved or cleared by the [place] FDA and  has been authorized for detection and/or diagnosis of SARS-CoV-2 by FDA under an Emergency Use Authorization (EUA). This EUA will remain  in effect (meaning this test can be used) for the duration of the COVID-19 declaration under Section 564(b)(1) of the Act, 21 U.S.C. section 360bbb-3(b)(1), unless the authorization is terminated or revoked sooner.     Influenza A by PCR NEGATIVE NEGATIVE Final   Influenza B by PCR NEGATIVE NEGATIVE Final    Comment: (NOTE) The Xpert Xpress SARS-CoV-2/FLU/RSV assay is intended as an aid in  the diagnosis of influenza from Nasopharyngeal swab specimens and  should not be used as a sole basis for treatment. Nasal washings and  aspirates are unacceptable for Xpert Xpress SARS-CoV-2/FLU/RSV  testing.  Fact Sheet for Patients: PinkCheek.be  Fact Sheet for Healthcare Providers: GravelBags.it  This test is not yet approved or cleared by the [place] FDA and  has been authorized for detection and/or diagnosis of SARS-CoV-2 by  FDA under an Emergency Use Authorization (EUA). This EUA will remain  in effect (meaning this test can be used) for the duration of the  Covid-19 declaration under Section 564(b)(1) of the Act, 21  U.S.C. section 360bbb-3(b)(1), unless the authorization is  terminated or revoked. Performed at [facility], [place] [address] [place], [place] [address]   Surgical PCR screen     Status: None   Collection Time: 08/06/20  2:16 AM   Specimen: Nasal Mucosa; Nasal Swab  Result  Value Ref Range Status   MRSA, PCR NEGATIVE NEGATIVE Final   Staphylococcus aureus NEGATIVE NEGATIVE Final    Comment: (NOTE) The Xpert SA Assay (FDA approved for NASAL specimens in patients 33 years of age and older), is one component of a comprehensive surveillance program. It is not intended to diagnose infection nor to guide or monitor treatment. Performed at [facility], [place] [address], [place], [place] [address]      Radiology Studies: CT Abdomen Pelvis Wo Contrast  Result Date: 08/05/2020 CLINICAL DATA:  Left abdominal pain EXAM: CT ABDOMEN AND PELVIS WITHOUT CONTRAST TECHNIQUE: Multidetector CT imaging of the abdomen and pelvis was performed following the standard protocol without IV contrast.  COMPARISON:  None. FINDINGS: Lower chest: Trace left pleural effusion.  Left base atelectasis. Hepatobiliary: No focal hepatic abnormality. Gallbladder unremarkable. Pancreas: No focal abnormality or ductal dilatation. Spleen: No focal abnormality.  Normal size. Adrenals/Urinary Tract: No adrenal abnormality. No focal renal abnormality. No stones or hydronephrosis. Urinary bladder is unremarkable. Stomach/Bowel: Stomach, large and small bowel grossly unremarkable. Vascular/Lymphatic: Diffuse aortoiliac atherosclerosis. No aneurysm or adenopathy. Reproductive: Prior hysterectomy.  No adnexal masses. Other: No free fluid or free air. Musculoskeletal: No acute bony abnormality. IMPRESSION: No acute findings in the abdomen or pelvis. Diffuse vascular atherosclerosis. Trace left pleural effusion with left base atelectasis. Electronically Signed   By: Rolm Baptise M.D.   On: 08/05/2020 00:14   DG Abd Acute W/Chest  Result Date: 08/04/2020 CLINICAL DATA:  Left-sided abdominal pain EXAM: DG ABDOMEN ACUTE WITH 1 VIEW CHEST COMPARISON:  None. FINDINGS: There is no evidence of dilated bowel loops or free intraperitoneal air. No radiopaque calculi or other significant radiographic  abnormality is seen. Vascular stents are seen within the mid abdomen. Heart size and mediastinal contours are within normal limits. Both lungs are clear. IMPRESSION: Negative abdominal radiographs.  No acute cardiopulmonary disease. Electronically Signed   By: Prudencio Pair M.D.   On: 08/04/2020 22:41    Scheduled Meds: . amLODipine  10 mg Oral Daily  . atenolol  25 mg Oral Daily  . atorvastatin  80 mg Oral Daily  . isosorbide mononitrate  60 mg Oral Daily  . mometasone-formoterol  2 puff Inhalation BID  . pantoprazole  40 mg Oral BID  . polyethylene glycol powder  1 Container Oral Once  . venlafaxine XR  150 mg Oral Q breakfast   Continuous Infusions:   LOS: 0 days   Marylu Lund, MD [facility] Pager On Amion  If 7PM-7AM, please contact night-coverage 08/06/2020, 3:38 PM

## 2020-08-06 NOTE — Plan of Care (Signed)
  Problem: Health Behavior/Discharge Planning: Goal: Ability to manage health-related needs will improve Outcome: Progressing   Problem: Clinical Measurements: Goal: Ability to maintain clinical measurements within normal limits will improve Outcome: Progressing Goal: Will remain free from infection Outcome: Progressing Goal: Diagnostic test results will improve Outcome: Progressing Goal: Respiratory complications will improve Outcome: Progressing Goal: Cardiovascular complication will be avoided Outcome: Progressing   Problem: Coping: Goal: Level of anxiety will decrease Outcome: Progressing   Problem: Elimination: Goal: Will not experience complications related to bowel motility Outcome: Progressing Goal: Will not experience complications related to urinary retention Outcome: Progressing   Problem: Pain Managment: Goal: General experience of comfort will improve Outcome: Progressing   Problem: Safety: Goal: Ability to remain free from injury will improve Outcome: Progressing   Problem: Skin Integrity: Goal: Risk for impaired skin integrity will decrease Outcome: Progressing   

## 2020-08-06 NOTE — Plan of Care (Signed)
  Problem: Education: Goal: Knowledge of General Education information will improve Description: Including pain rating scale, medication(s)/side effects and non-pharmacologic comfort measures Outcome: Completed/Met   Problem: Activity: Goal: Risk for activity intolerance will decrease Outcome: Completed/Met   Problem: Nutrition: Goal: Adequate nutrition will be maintained Outcome: Completed/Met   

## 2020-08-06 NOTE — Anesthesia Postprocedure Evaluation (Signed)
Anesthesia Post Note  Patient: Engineer, site  Procedure(s) Performed: ESOPHAGOGASTRODUODENOSCOPY (EGD) WITH PROPOFOL (N/A ) BIOPSY     Patient location during evaluation: PACU Anesthesia Type: MAC Level of consciousness: awake and alert Pain management: pain level controlled Vital Signs Assessment: post-procedure vital signs reviewed and stable Respiratory status: spontaneous breathing, nonlabored ventilation, respiratory function stable and patient connected to nasal cannula oxygen Cardiovascular status: stable and blood pressure returned to baseline Postop Assessment: no apparent nausea or vomiting Anesthetic complications: no   No complications documented.  Last Vitals:  Vitals:   08/06/20 0832 08/06/20 0843  BP: (!) 204/88 (!) 187/89  Pulse: 78 78  Resp: 17 16  Temp: 36.7 C   SpO2: 100% 95%    Last Pain:  Vitals:   08/06/20 0843  TempSrc:   PainSc: 8                  Naava Janeway DAVID

## 2020-08-07 ENCOUNTER — Inpatient Hospital Stay (HOSPITAL_COMMUNITY): Payer: Medicare Other | Admitting: Certified Registered Nurse Anesthetist

## 2020-08-07 ENCOUNTER — Encounter (HOSPITAL_COMMUNITY)
Admission: EM | Disposition: A | Discharge status: Home / Self Care | Payer: Self-pay | Source: Home / Self Care | Attending: Internal Medicine

## 2020-08-07 DIAGNOSIS — K5520 Angiodysplasia of colon without hemorrhage: Secondary | ICD-10-CM | POA: Diagnosis not present

## 2020-08-07 DIAGNOSIS — D62 Acute posthemorrhagic anemia: Secondary | ICD-10-CM | POA: Diagnosis not present

## 2020-08-07 DIAGNOSIS — K922 Gastrointestinal hemorrhage, unspecified: Secondary | ICD-10-CM | POA: Diagnosis not present

## 2020-08-07 DIAGNOSIS — K635 Polyp of colon: Secondary | ICD-10-CM

## 2020-08-07 DIAGNOSIS — F17210 Nicotine dependence, cigarettes, uncomplicated: Secondary | ICD-10-CM | POA: Diagnosis not present

## 2020-08-07 HISTORY — PX: HOT HEMOSTASIS: SHX5433

## 2020-08-07 HISTORY — PX: POLYPECTOMY: SHX5525

## 2020-08-07 HISTORY — PX: COLONOSCOPY WITH PROPOFOL: SHX5780

## 2020-08-07 LAB — COMPREHENSIVE METABOLIC PANEL
ALT: 16 U/L (ref 0–44)
AST: 24 U/L (ref 15–41)
Albumin: 3.4 g/dL — ABNORMAL LOW (ref 3.5–5.0)
Alkaline Phosphatase: 94 U/L (ref 38–126)
Anion gap: 9 (ref 5–15)
BUN: 12 mg/dL (ref 6–20)
CO2: 28 mmol/L (ref 22–32)
Calcium: 9.4 mg/dL (ref 8.9–10.3)
Chloride: 100 mmol/L (ref 98–111)
Creatinine, Ser: 1.92 mg/dL — ABNORMAL HIGH (ref 0.44–1.00)
GFR, Estimated: 30 mL/min — ABNORMAL LOW (ref >60)
Glucose, Bld: 97 mg/dL (ref 70–99)
Potassium: 4.2 mmol/L (ref 3.5–5.1)
Sodium: 137 mmol/L (ref 135–145)
Total Bilirubin: 0.9 mg/dL (ref 0.3–1.2)
Total Protein: 6.7 g/dL (ref 6.5–8.1)

## 2020-08-07 LAB — CBC
HCT: 28.7 % — ABNORMAL LOW (ref 36.0–46.0)
Hemoglobin: 8.5 g/dL — ABNORMAL LOW (ref 12.0–15.0)
MCH: 25.4 pg — ABNORMAL LOW (ref 26.0–34.0)
MCHC: 29.6 g/dL — ABNORMAL LOW (ref 30.0–36.0)
MCV: 85.7 fL (ref 80.0–100.0)
Platelets: 293 10*3/uL (ref 150–400)
RBC: 3.35 MIL/uL — ABNORMAL LOW (ref 3.87–5.11)
RDW: 16.8 % — ABNORMAL HIGH (ref 11.5–15.5)
WBC: 6.6 10*3/uL (ref 4.0–10.5)
nRBC: 0 % (ref 0.0–0.2)

## 2020-08-07 LAB — MAGNESIUM: Magnesium: 2 mg/dL (ref 1.7–2.4)

## 2020-08-07 SURGERY — COLONOSCOPY WITH PROPOFOL
Anesthesia: Monitor Anesthesia Care

## 2020-08-07 MED ORDER — CLOPIDOGREL BISULFATE 75 MG PO TABS: 75.0000 mg | ORAL_TABLET | Freq: Every day | ORAL | 2 refills | Status: DC

## 2020-08-07 MED ORDER — LACTATED RINGERS IV SOLN
INTRAVENOUS | Status: AC | PRN
  Administered 2020-08-07: 1000 mL via INTRAVENOUS

## 2020-08-07 MED ORDER — BSS IO SOLN
15.0000 mL | Freq: Once | INTRAOCULAR | Status: AC
  Administered 2020-08-07: 15 mL
  Filled 2020-08-07: qty 15

## 2020-08-07 MED ORDER — VENLAFAXINE HCL ER 150 MG PO CP24
150.0000 mg | ORAL_CAPSULE | Freq: Every day | ORAL | Status: DC
  Administered 2020-08-07: 150 mg via ORAL

## 2020-08-07 MED ORDER — POLYETHYLENE GLYCOL 3350 17 G PO PACK: 17.0000 g | PACK | Freq: Every day | ORAL | 0 refills | Status: DC

## 2020-08-07 MED ORDER — KETOROLAC TROMETHAMINE 0.5 % OP SOLN
1.0000 [drp] | Freq: Three times a day (TID) | OPHTHALMIC | Status: DC | PRN
  Administered 2020-08-07: 1 [drp] via OPHTHALMIC
  Filled 2020-08-07: qty 5

## 2020-08-07 MED ORDER — AMLODIPINE BESYLATE 10 MG PO TABS: 10.0000 mg | ORAL_TABLET | Freq: Every day | ORAL | 0 refills | Status: DC

## 2020-08-07 MED ORDER — PROPOFOL 500 MG/50ML IV EMUL
INTRAVENOUS | Status: DC | PRN
  Administered 2020-08-07: 160 ug/kg/min via INTRAVENOUS

## 2020-08-07 MED ORDER — PROPOFOL 500 MG/50ML IV EMUL
INTRAVENOUS | Status: AC
  Filled 2020-08-07: qty 50

## 2020-08-07 MED ORDER — HYDRALAZINE HCL 20 MG/ML IJ SOLN
INTRAMUSCULAR | Status: AC
  Filled 2020-08-07: qty 1

## 2020-08-07 MED ORDER — PROPOFOL 10 MG/ML IV BOLUS
INTRAVENOUS | Status: AC
  Filled 2020-08-07: qty 20

## 2020-08-07 MED ORDER — TRAMADOL HCL 50 MG PO TABS
50.0000 mg | ORAL_TABLET | Freq: Four times a day (QID) | ORAL | 0 refills | Status: DC | PRN
Start: 2020-08-07 — End: 2020-08-27

## 2020-08-07 MED ORDER — PROPOFOL 10 MG/ML IV BOLUS
INTRAVENOUS | Status: DC | PRN
  Administered 2020-08-07: 20 mg via INTRAVENOUS
  Administered 2020-08-07: 30 mg via INTRAVENOUS

## 2020-08-07 SURGICAL SUPPLY — 22 items

## 2020-08-07 NOTE — Op Note (Signed)
[facility] Patient Name: [name] Procedure Date: 08/07/2020 MRN: [id number] Attending MD: Jackquline Denmark , MD Date of Birth: [birth year] CSN: [id number] Age: 59 Admit Type: Inpatient Procedure:                Colonoscopy Indications:              Melena with neg EGD Providers:                Jackquline Denmark, MD, Glori Bickers, RN, Tyrone Apple, Technician, Courtney Heys [place], CRNA Referring MD:              Medicines:                Monitored Anesthesia Care Complications:            No immediate complications. Estimated Blood Loss:     Estimated blood loss: none. Procedure:                Pre-Anesthesia Assessment:                           - Prior to the procedure, a History and Physical                            was performed, and patient medications and                            allergies were reviewed. [name] tolerance of                            previous anesthesia was also reviewed. The risks                            and benefits of the procedure and the sedation                            options and risks were discussed with the patient.                            All questions were answered, and informed consent                            was obtained. Prior Anticoagulants: The patient has                            taken Plavix (clopidogrel), last dose was 3 days                            prior to procedure. ASA Grade Assessment: III - A                            patient with severe systemic disease. After  reviewing the risks and benefits, the patient was                            deemed in satisfactory condition to undergo the                            procedure.                           After obtaining informed consent, the colonoscope                            was passed under direct vision. Throughout the                            procedure, the patient's blood pressure,  pulse, and                            oxygen saturations were monitored continuously. The                            [id number]-[id number] ([id number] Olympus pediatric colonscope                            was introduced through the anus and advanced to the                            the cecum, identified by appendiceal orifice and                            ileocecal valve. The colonoscopy was somewhat                            difficult due to inadequate bowel prep. Successful                            completion of the procedure was aided by lavage.                            The patient tolerated the procedure well. The                            quality of the bowel preparation was adequate to                            identify polyps 6 mm and larger in size. The                            ileocecal valve, appendiceal orifice, and rectum                            were photographed. Scope In: 8:19:03 AM Scope Out: 8:44:18 AM Scope Withdrawal Time: 0 hours 18 minutes 11 seconds  Total Procedure Duration: 0 hours 25 minutes  15 seconds  Findings:      A single small localized angiodysplastic lesion without bleeding was       found in the proximal ascending colon. Coagulation for hemostasis using       argon plasma at 2 liters/minute and 20 watts was successful.      A 8 mm polyp was found in the distal sigmoid colon just above the       anastomosis. The polyp was sessile. The polyp was removed with a cold       snare. Resection and retrieval were complete. Few hyperplastic appearing       polyps were visualized in the rectum. These were confirmed by NBI. These       were not removed.      A few small-mouthed diverticula were found in the sigmoid colon.      There was evidence of a prior end-to-end colo-colonic anastomosis in the       mid sigmoid colon s/p partial sigmoid resection. This was patent and was       characterized by healthy appearing mucosa. The anastomosis was traversed.       Non-bleeding internal hemorrhoids were found during retroflexion. The       hemorrhoids were small.      Colonoscopy was limited d/t quality of preparation. There was solid       retained vegetable material despite 2-day preparation. This would clog       the suction channel of the scope. Despite aggressive suctioning and       aspiration, small and flat lesions could have been missed. Impression:               - A single non-bleeding colonic angiodysplastic                            lesion. Treated with argon plasma coagulation (APC).                           - One 8 mm polyp in the distal sigmoid colon,                            removed with a cold snare. Resected and retrieved.                           - Minimal neo-sigmoid diverticulosis.                           - Patent end-to-end colo-colonic anastomosis,                            characterized by healthy appearing mucosa.                           - Non-bleeding internal hemorrhoids. Moderate Sedation:      Not Applicable - Patient had care per Anesthesia. Recommendation:           - Return patient to hospital ward for ongoing care.                           - Resume previous diet.                           -  Continue present medications.                           - Resume Plavix (clopidogrel) at prior dose                            tomorrow.                           - The findings and recommendations were discussed                            with the patient. Copy of the report was also given                           - Miralax 1 capful (17 grams) in 8 ounces of water                            PO daily.                           - She needs to FU with Dr. Morrie Sheldon clinic in 3                            to 4 weeks. Depending upon the above biopsy                            results, she would need repeat colonoscopy (likely                            in 6 months), with 2-day preparation.                           -  Minimize pain medications.                           - Will sign off for now. Procedure Code(s):        --- Professional ---                           [phone], 59, Colonoscopy, flexible; with control of                            bleeding, any method                           [id number], Colonoscopy, flexible; with removal of                            tumor(s), polyp(s), or other lesion(s) by snare                            technique Diagnosis Code(s):        --- Professional ---  K55.20, Angiodysplasia of colon without hemorrhage                           K63.5, Polyp of colon                           K64.8, Other hemorrhoids                           Z98.0, Intestinal bypass and anastomosis status                           K92.1, Melena (includes Hematochezia)                           K57.30, Diverticulosis of large intestine without                            perforation or abscess without bleeding CPT copyright 2019 [employer]. All rights reserved. The codes documented in this report are preliminary and upon coder review may  be revised to meet current compliance requirements. Jackquline Denmark, MD 08/07/2020 9:04:44 AM This report has been signed electronically. Number of Addenda: 0

## 2020-08-07 NOTE — Progress Notes (Signed)
Pt given dc instructions including prescriptions and follow up appointments. Pt verbalized understanding of all instructions. Pt has transportation available for dc to home and for appointments. Gabryela Kimbrell, Laurel Dimmer, RN

## 2020-08-07 NOTE — Interval H&P Note (Signed)
History and Physical Interval Note:  08/07/2020 8:03 AM  [name]  has presented today for surgery, with the diagnosis of Dr. Lyndel Safe.  The various methods of treatment have been discussed with the patient and family. After consideration of risks, benefits and other options for treatment, the patient has consented to  Procedure(s): COLONOSCOPY WITH PROPOFOL (N/A) as a surgical intervention.  [name] history has been reviewed, patient examined, no change in status, stable for surgery.  I have reviewed the patient's chart and labs.  Questions were answered to the patient's satisfaction.     Jackquline Denmark

## 2020-08-07 NOTE — Discharge Summary (Signed)
Physician Discharge Summary  [name] MVE:[id number] DOB: [birth year] DOA: 08/04/2020  PCP: Bernerd Limbo, MD  Admit date: 08/04/2020 Discharge date: 08/07/2020  Admitted From: Home Disposition:  Home  Recommendations for Outpatient Follow-up:  1. Follow up with PCP in 1-2 weeks 2. F/u with Dr. Ardis Hughs in 3-4 weeks  Discharge Condition:Stable CODE STATUS:Full Diet recommendation: Heart healthy   Brief/Interim Summary: 59 y.o.femalewith medical history significant ofresistant hypertension, COPD, CKD, bilateral renal artery angioplasty, peripheral arterial disease with stent placement in abdominal aorta, anxiety/depression, tobacco dependence, diverticulosis and colon resection presented to ED complaining of pain in her left lower abdominal quadrant and no bowel movement for the last 4 days. Patient mentioned that she noticed melena followed by bright red per rectum for multiple times during the last week but did not have any more bleeding for the last 4 days because she had no bowel movement. Patient is also complaining of severe generalized weakness and nausea but no vomiting. Patient otherwise denies fever, chills, chest pain, shortness of breath, hematemesis, urinary frequency, hematuria, vaginal bleeding and swelling in bilateral lower extremities. Patient further mentioned that she is on Plavix and warfarin because of abdominal aortic stents in the setting of worsening peripheral arterial disease  On arrival to ED patient had blood pressure of 185/83, heart rate 83, temperature 98.1, respiratory rate 16 and oxygen saturation 98% on room air. Blood work showed WBC of 9 and hemoglobin 0, sodium 133, potassium 3.9, BUN 20, creatinine 1.83. Digital rectal exam showed no stool in the rectum and no melena or bright red blood. As the patient is on warfarin therefore INR ordered. CT scan abdomen/pelvis was negative for acute pathology. ED physician ordered blood transfusion and  also contacted GI  Discharge Diagnoses:  Principal Problem:   Acute GI bleeding Active Problems:   Essential hypertension   Cigarette smoker   Renal artery stenosis, native, bilateral (HCC)   Acute posthemorrhagic anemia   GI bleed  Principal Problem: Acute GI bleeding with acute blood loss anemia Patient reported melena and hematochezia during last week and found to have hemoglobin of 5.  No active bleeding was appreciated at the time of presentation Pt required 2 units PRBCs in the ED GI consulted, appreciate recs.  Pt is now s/p unremarkable EGD done 11/6 Pt is colonoscopy was done on 11/7, findings of a single non-bleeding colonic angiodysplastic lesion treated with argon plasma coagulation. 58mm polyp was also removed -GI recommendation for miralax daily and close follow up with Dr. Ardis Hughs in 3-4 weeks -Per GI, pt to resume plavix on 11/8  Active Problems: Essential hypertension Patient states that she has a history of poorly controlled hypertension and cardiologist recently added hydralazine to her blood pressure medications but she is not taking hydralazine because it gives her bad feelings.  -Continue home medications. IV hydralazine 10 mg every 6 hours as needed was continued while in hospital -BP had remained stable, albeit suboptimally controlled -Cont home regimen. Recommend BP med titration as outpatient  Hyperlipidemia Continue home statin. Patient previously had statin related myalgia and cardiology recommended to hold statin for 4 weeks.  Currently without myalgias at this time.  Anxiety/depression Remains stable at this time  COPD Not in acute exacerbation.  On minimal O2 support at this time. No wheezing.  Cigarette smoker Tobacco cessation counseling done at time of presentation  Chronic kidney disease Current Cr remained near baseline  Renal artery stenosis, native, bilateral ([place]) Follows up with cardiology after  d/c  Abdominal aortic  stenosis status post stenting Patient follows up with cardiology on regular basis   Discharge Instructions   Allergies as of 08/07/2020      Reactions   Doxycycline Anaphylaxis, Hives   Hydrocodone-acetaminophen Nausea And Vomiting   Iohexol Itching   Pt has itching nose after iv contrast injection      Medication List    STOP taking these medications   gabapentin 400 MG capsule Commonly known as: NEURONTIN     TAKE these medications   acetaminophen 500 MG tablet Commonly known as: TYLENOL Take 1,000 mg by mouth every 8 (eight) hours as needed for mild pain or headache.   albuterol 108 (90 Base) MCG/ACT inhaler Commonly known as: VENTOLIN HFA Inhale 2 puffs into the lungs 2 (two) times daily as needed for wheezing or shortness of breath (asthma).   ALPRAZolam 1 MG tablet Commonly known as: XANAX Take 0.5 tablets by mouth 2 (two) times daily as needed for anxiety.   amLODipine 10 MG tablet Commonly known as: NORVASC Take 0.5 tablets (5 mg total) by mouth daily.   aspirin EC 81 MG tablet Take 1 tablet (81 mg total) by mouth daily.   atenolol 25 MG tablet Commonly known as: TENORMIN TAKE 1 TABLET BY MOUTH ONCE A DAY   atorvastatin 80 MG tablet Commonly known as: LIPITOR Take 80 mg by mouth daily.   azelastine 0.1 % nasal spray Commonly known as: ASTELIN Place 1 spray into both nostrils daily as needed for rhinitis or allergies.   clopidogrel 75 MG tablet Commonly known as: Plavix Take 1 tablet (75 mg total) by mouth daily. Start taking on: August 08, 2020   ezetimibe 10 MG tablet Commonly known as: ZETIA Take 1 tablet (10 mg total) by mouth daily.   hydrALAZINE 25 MG tablet Commonly known as: APRESOLINE Take 1 tablet (25 mg total) by mouth 3 (three) times daily. What changed: how much to take   isosorbide mononitrate 60 MG 24 hr tablet Commonly known as: IMDUR TAKE 1 TABLET BY MOUTH DAILY   mometasone-formoterol 100-5  MCG/ACT Aero Commonly known as: DULERA Take 2 puffs first thing in am and then another 2 puffs about 12 hours later. What changed:   how much to take  how to take this  when to take this  additional instructions   nitroGLYCERIN 0.4 MG SL tablet Commonly known as: NITROSTAT Place 1 tablet (0.4 mg total) under the tongue every 5 (five) minutes as needed for chest pain.   pantoprazole 40 MG tablet Commonly known as: PROTONIX TAKE 1 TABLET BY MOUTH TWICE (2) DAILY What changed: See the new instructions.   traZODone 100 MG tablet Commonly known as: DESYREL Take 100 mg by mouth at bedtime as needed for sleep.   venlafaxine XR 150 MG 24 hr capsule Commonly known as: Effexor XR Take 1 capsule (150 mg total) by mouth daily with breakfast. What changed: how much to take       Follow-up Information    Bernerd Limbo, MD. Schedule an appointment as soon as possible for a visit in 2 week(s).   Specialty: Family Medicine Contact information: [place] [address] [phone]        Milus Banister, MD. Schedule an appointment as soon as possible for a visit in 3 week(s).   Specialty: Gastroenterology Contact information: [address] [place] [address] [phone]              Allergies  Allergen  Reactions  . Doxycycline Anaphylaxis and Hives  . Hydrocodone-Acetaminophen Nausea And Vomiting  . Iohexol Itching    Pt has itching nose after iv contrast injection    Consultations:  GI  Procedures/Studies: CT Abdomen Pelvis Wo Contrast  Result Date: 08/05/2020 CLINICAL DATA:  Left abdominal pain EXAM: CT ABDOMEN AND PELVIS WITHOUT CONTRAST TECHNIQUE: Multidetector CT imaging of the abdomen and pelvis was performed following the standard protocol without IV contrast. COMPARISON:  None. FINDINGS: Lower chest: Trace left pleural effusion.  Left base atelectasis. Hepatobiliary: No focal hepatic abnormality. Gallbladder  unremarkable. Pancreas: No focal abnormality or ductal dilatation. Spleen: No focal abnormality.  Normal size. Adrenals/Urinary Tract: No adrenal abnormality. No focal renal abnormality. No stones or hydronephrosis. Urinary bladder is unremarkable. Stomach/Bowel: Stomach, large and small bowel grossly unremarkable. Vascular/Lymphatic: Diffuse aortoiliac atherosclerosis. No aneurysm or adenopathy. Reproductive: Prior hysterectomy.  No adnexal masses. Other: No free fluid or free air. Musculoskeletal: No acute bony abnormality. IMPRESSION: No acute findings in the abdomen or pelvis. Diffuse vascular atherosclerosis. Trace left pleural effusion with left base atelectasis. Electronically Signed   By: Rolm Baptise M.D.   On: 08/05/2020 00:14   US RENAL  Result Date: 07/20/2020 CLINICAL DATA:  Stage IV chronic kidney disease EXAM: RENAL / URINARY TRACT ULTRASOUND COMPLETE COMPARISON:  04/30/2014 FINDINGS: Right Kidney: Renal measurements: 9.5 x 3.5 x 4.4 cm = volume: 76 mL. Normal cortical thickness and echogenicity. Tiny exophytic cyst 9 x 8 x 7 mm inferior pole. No additional mass or hydronephrosis. No shadowing calcifications. Left Kidney: Renal measurements: 9.5 x 3.8 x 4.3 cm = volume: 81 mL. Normal cortical thickness and echogenicity. No mass, hydronephrosis or shadowing calcification. Bladder: Contains only a small amount of urine, no gross abnormality identified. Other: N/A IMPRESSION: No significant renal sonographic abnormalities. Electronically Signed   By: Lavonia Dana M.D.   On: 07/20/2020 17:32   DG Abd Acute W/Chest  Result Date: 08/04/2020 CLINICAL DATA:  Left-sided abdominal pain EXAM: DG ABDOMEN ACUTE WITH 1 VIEW CHEST COMPARISON:  None. FINDINGS: There is no evidence of dilated bowel loops or free intraperitoneal air. No radiopaque calculi or other significant radiographic abnormality is seen. Vascular stents are seen within the mid abdomen. Heart size and mediastinal contours are within normal  limits. Both lungs are clear. IMPRESSION: Negative abdominal radiographs.  No acute cardiopulmonary disease. Electronically Signed   By: Prudencio Pair M.D.   On: 08/04/2020 22:41     Subjective: Eager to go home  Discharge Exam: Vitals:   08/07/20 0920 08/07/20 0928  BP: (!) 199/75 (!) 180/74  Pulse: 63 70  Resp: 10 11  Temp:    SpO2: 100% 99%   Vitals:   08/07/20 0904 08/07/20 0910 08/07/20 0920 08/07/20 0928  BP: (!) 202/83 (!) 203/100 (!) 199/75 (!) 180/74  Pulse: 63 61 63 70  Resp: 15 13 10 11   Temp:      TempSrc:      SpO2: 100% 100% 100% 99%  Weight:      Height:        General: Pt is alert, awake, not in acute distress Cardiovascular: RRR, S1/S2 +, no rubs, no gallops Respiratory: CTA bilaterally, no wheezing, no rhonchi Abdominal: Soft, NT, ND, bowel sounds + Extremities: no edema, no cyanosis   The results of significant diagnostics from this hospitalization (including imaging, microbiology, ancillary and laboratory) are listed below for reference.     Microbiology: Recent Results (from the past 240 hour(s))  Respiratory Panel by RT  PCR (Flu A&B, Covid) - Nasopharyngeal Swab     Status: None   Collection Time: 08/04/20 11:13 PM   Specimen: Nasopharyngeal Swab  Result Value Ref Range Status   SARS Coronavirus 2 by RT PCR NEGATIVE NEGATIVE Final    Comment: (NOTE) SARS-CoV-2 target nucleic acids are NOT DETECTED.  The SARS-CoV-2 RNA is generally detectable in upper respiratoy specimens during the acute phase of infection. The lowest concentration of SARS-CoV-2 viral copies this assay can detect is 131 copies/mL. A negative result does not preclude SARS-Cov-2 infection and should not be used as the sole basis for treatment or other patient management decisions. A negative result may occur with  improper specimen collection/handling, submission of specimen other than nasopharyngeal swab, presence of viral mutation(s) within the areas targeted by this assay,  and inadequate number of viral copies (<131 copies/mL). A negative result must be combined with clinical observations, patient history, and epidemiological information. The expected result is Negative.  Fact Sheet for Patients:  PinkCheek.be  Fact Sheet for Healthcare Providers:  GravelBags.it  This test is no t yet approved or cleared by the [place] FDA and  has been authorized for detection and/or diagnosis of SARS-CoV-2 by FDA under an Emergency Use Authorization (EUA). This EUA will remain  in effect (meaning this test can be used) for the duration of the COVID-19 declaration under Section 564(b)(1) of the Act, 21 U.S.C. section 360bbb-3(b)(1), unless the authorization is terminated or revoked sooner.     Influenza A by PCR NEGATIVE NEGATIVE Final   Influenza B by PCR NEGATIVE NEGATIVE Final    Comment: (NOTE) The Xpert Xpress SARS-CoV-2/FLU/RSV assay is intended as an aid in  the diagnosis of influenza from Nasopharyngeal swab specimens and  should not be used as a sole basis for treatment. Nasal washings and  aspirates are unacceptable for Xpert Xpress SARS-CoV-2/FLU/RSV  testing.  Fact Sheet for Patients: PinkCheek.be  Fact Sheet for Healthcare Providers: GravelBags.it  This test is not yet approved or cleared by the [place] FDA and  has been authorized for detection and/or diagnosis of SARS-CoV-2 by  FDA under an Emergency Use Authorization (EUA). This EUA will remain  in effect (meaning this test can be used) for the duration of the  Covid-19 declaration under Section 564(b)(1) of the Act, 21  U.S.C. section 360bbb-3(b)(1), unless the authorization is  terminated or revoked. Performed at [facility], [place] [address] [place], [place] [address]   Surgical PCR screen     Status: None   Collection Time: 08/06/20  2:16 AM    Specimen: Nasal Mucosa; Nasal Swab  Result Value Ref Range Status   MRSA, PCR NEGATIVE NEGATIVE Final   Staphylococcus aureus NEGATIVE NEGATIVE Final    Comment: (NOTE) The Xpert SA Assay (FDA approved for NASAL specimens in patients 51 years of age and older), is one component of a comprehensive surveillance program. It is not intended to diagnose infection nor to guide or monitor treatment. Performed at [facility], [place] [address] [place], [place] [address]      Labs: BNP (last 3 results) No results for input(s): BNP in the last 8760 hours. Basic Metabolic Panel: Recent Labs  Lab 08/04/20 2211 08/06/20 0701 08/07/20 0657  NA 133* 137 137  K 3.9 3.2* 4.2  CL 98 97* 100  CO2 21* 25 28  GLUCOSE 112* 90 97  BUN 20 16 12   CREATININE 1.83* 1.80* 1.92*  CALCIUM 9.5 9.1 9.4  MG  --   --  2.0   Liver Function Tests: Recent Labs  Lab 08/04/20 2211 08/06/20 0701 08/07/20 0657  AST 26 24 24   ALT 16 16 16   ALKPHOS 105 95 94  BILITOT 0.6 1.0 0.9  PROT 7.7 6.9 6.7  ALBUMIN 4.0 3.8 3.4*   Recent Labs  Lab 08/04/20 2211  LIPASE 39   No results for input(s): AMMONIA in the last 168 hours. CBC: Recent Labs  Lab 08/04/20 2211 08/05/20 0757 08/06/20 0701 08/07/20 0657  WBC 9.0  --  6.9 6.6  NEUTROABS 6.6  --   --   --   HGB 5.0* 8.0* 8.2* 8.5*  HCT 17.4* 26.2* 27.4* 28.7*  MCV 82.5  --  84.6 85.7  PLT 424*  --  287 293   Cardiac Enzymes: No results for input(s): CKTOTAL, CKMB, CKMBINDEX, TROPONINI in the last 168 hours. BNP: Invalid input(s): POCBNP CBG: No results for input(s): GLUCAP in the last 168 hours. D-Dimer No results for input(s): DDIMER in the last 72 hours. Hgb A1c No results for input(s): HGBA1C in the last 72 hours. Lipid Profile No results for input(s): CHOL, HDL, LDLCALC, TRIG, CHOLHDL, LDLDIRECT in the last 72 hours. Thyroid function studies No results for input(s): TSH, T4TOTAL, T3FREE, THYROIDAB in the last 72  hours.  Invalid input(s): FREET3 Anemia work up No results for input(s): VITAMINB12, FOLATE, FERRITIN, TIBC, IRON, RETICCTPCT in the last 72 hours. Urinalysis    Component Value Date/Time   COLORURINE YELLOW 08/05/2020 0640   APPEARANCEUR CLEAR 08/05/2020 0640   APPEARANCEUR Hazy 06/07/2012 1350   LABSPEC 1.014 08/05/2020 0640   LABSPEC 1.016 06/07/2012 1350   PHURINE 5.0 08/05/2020 0640   GLUCOSEU NEGATIVE 08/05/2020 0640   GLUCOSEU Negative 06/07/2012 1350   HGBUR NEGATIVE 08/05/2020 0640   BILIRUBINUR NEGATIVE 08/05/2020 0640   BILIRUBINUR Negative 06/07/2012 1350   KETONESUR NEGATIVE 08/05/2020 0640   PROTEINUR >=300 (A) 08/05/2020 0640   UROBILINOGEN 0.2 06/28/2014 1518   NITRITE NEGATIVE 08/05/2020 0640   LEUKOCYTESUR NEGATIVE 08/05/2020 0640   LEUKOCYTESUR Negative 06/07/2012 1350   Sepsis Labs Invalid input(s): PROCALCITONIN,  WBC,  LACTICIDVEN Microbiology Recent Results (from the past 240 hour(s))  Respiratory Panel by RT PCR (Flu A&B, Covid) - Nasopharyngeal Swab     Status: None   Collection Time: 08/04/20 11:13 PM   Specimen: Nasopharyngeal Swab  Result Value Ref Range Status   SARS Coronavirus 2 by RT PCR NEGATIVE NEGATIVE Final    Comment: (NOTE) SARS-CoV-2 target nucleic acids are NOT DETECTED.  The SARS-CoV-2 RNA is generally detectable in upper respiratoy specimens during the acute phase of infection. The lowest concentration of SARS-CoV-2 viral copies this assay can detect is 131 copies/mL. A negative result does not preclude SARS-Cov-2 infection and should not be used as the sole basis for treatment or other patient management decisions. A negative result may occur with  improper specimen collection/handling, submission of specimen other than nasopharyngeal swab, presence of viral mutation(s) within the areas targeted by this assay, and inadequate number of viral copies (<131 copies/mL). A negative result must be combined with clinical observations,  patient history, and epidemiological information. The expected result is Negative.  Fact Sheet for Patients:  PinkCheek.be  Fact Sheet for Healthcare Providers:  GravelBags.it  This test is no t yet approved or cleared by the [place] FDA and  has been authorized for detection and/or diagnosis of SARS-CoV-2 by FDA under an Emergency Use Authorization (EUA). This EUA will remain  in effect (meaning this test can  be used) for the duration of the COVID-19 declaration under Section 564(b)(1) of the Act, 21 U.S.C. section 360bbb-3(b)(1), unless the authorization is terminated or revoked sooner.     Influenza A by PCR NEGATIVE NEGATIVE Final   Influenza B by PCR NEGATIVE NEGATIVE Final    Comment: (NOTE) The Xpert Xpress SARS-CoV-2/FLU/RSV assay is intended as an aid in  the diagnosis of influenza from Nasopharyngeal swab specimens and  should not be used as a sole basis for treatment. Nasal washings and  aspirates are unacceptable for Xpert Xpress SARS-CoV-2/FLU/RSV  testing.  Fact Sheet for Patients: PinkCheek.be  Fact Sheet for Healthcare Providers: GravelBags.it  This test is not yet approved or cleared by the [place] FDA and  has been authorized for detection and/or diagnosis of SARS-CoV-2 by  FDA under an Emergency Use Authorization (EUA). This EUA will remain  in effect (meaning this test can be used) for the duration of the  Covid-19 declaration under Section 564(b)(1) of the Act, 21  U.S.C. section 360bbb-3(b)(1), unless the authorization is  terminated or revoked. Performed at [facility], [place] [address] [place], [place] [address]   Surgical PCR screen     Status: None   Collection Time: 08/06/20  2:16 AM   Specimen: Nasal Mucosa; Nasal Swab  Result Value Ref Range Status   MRSA, PCR NEGATIVE NEGATIVE Final    Staphylococcus aureus NEGATIVE NEGATIVE Final    Comment: (NOTE) The Xpert SA Assay (FDA approved for NASAL specimens in patients 36 years of age and older), is one component of a comprehensive surveillance program. It is not intended to diagnose infection nor to guide or monitor treatment. Performed at [facility], [place] [address], [place],  [address]    Time spent: 60min  SIGNED:   Marylu Lund, MD  [facility] 08/07/2020, 11:22 AM  If 7PM-7AM, please contact night-coverage

## 2020-08-07 NOTE — Anesthesia Preprocedure Evaluation (Addendum)
Anesthesia Evaluation  Patient identified by MRN, date of birth, ID band Patient awake    Reviewed: Allergy & Precautions, NPO status , Patient's Chart, lab work & pertinent test results, reviewed documented beta blocker date and time   Airway Mallampati: II  TM Distance: >3 FB Neck ROM: Full    Dental  (+) Edentulous Upper, Edentulous Lower   Pulmonary shortness of breath, asthma , COPD,  COPD inhaler, Current Smoker and Patient abstained from smoking.,    Pulmonary exam normal breath sounds clear to auscultation       Cardiovascular hypertension, Pt. on medications and Pt. on home beta blockers (-) angina+ CAD, + Past MI, + Peripheral Vascular Disease and +CHF  + Valvular Problems/Murmurs AS  Rhythm:Regular Rate:Normal + Systolic murmurs    Neuro/Psych  Headaches, PSYCHIATRIC DISORDERS Anxiety Depression Bipolar Disorder Schizophrenia    GI/Hepatic Neg liver ROS, GERD  Medicated,  Endo/Other  negative endocrine ROS  Renal/GU Renal InsufficiencyRenal disease (Renal stents)     Musculoskeletal negative musculoskeletal ROS (+)   Abdominal   Peds  Hematology  (+) Blood dyscrasia (PLAVIX), anemia ,   Anesthesia Other Findings Day of surgery medications reviewed with the patient.  Reproductive/Obstetrics                            Anesthesia Physical Anesthesia Plan  ASA: III  Anesthesia Plan: MAC   Post-op Pain Management:    Induction: Intravenous  PONV Risk Score and Plan: 1 and Propofol infusion and Treatment may vary due to age or medical condition  Airway Management Planned: Nasal Cannula and Natural Airway  Additional Equipment:   Intra-op Plan:   Post-operative Plan:   Informed Consent: I have reviewed the patients History and Physical, chart, labs and discussed the procedure including the risks, benefits and alternatives for the proposed anesthesia with the patient or  authorized representative who has indicated his/her understanding and acceptance.       Plan Discussed with: CRNA and Anesthesiologist  Anesthesia Plan Comments:         Anesthesia Quick Evaluation

## 2020-08-07 NOTE — Progress Notes (Signed)
Chaplain responded to request from patient RN to offer support to the patient who is feeling down and over whelmed with life.  Patient is being discharged today.  Chaplain arrived and patient sitting on her bed, with boyfriend present.  Patient recounted her medical issues and non relationship with many of her family members.  Patient feels, at time, like life is too much.  Patient explores does her life matter, what is the purpose of her life.  Chaplain offered ministry of presence, empathetic/reflective listening and words of comfort and support.  Chaplain and patient discusses some spiritual exercises such as breathing and reciting phrases to find some center and peace. Patient seems to understand the importance of beginning to forgiver herself and love herself.  Chaplain offered prayer.  Chaplain available as needed. [place], MDiv.    08/07/20 1339  Clinical Encounter Type  Visited With Patient and family together;Health care provider  Visit Type Psychological support;Spiritual support;Social support  Referral From Nurse  Consult/Referral To Chaplain  Spiritual Encounters  Spiritual Needs Emotional;Prayer  Stress Factors  Patient Stress Factors Health changes;Family relationships;Exhausted

## 2020-08-07 NOTE — Progress Notes (Signed)
Chaplin services called to visit with pt prior to discharge.  Pt "feeling sad and depressed" after recent colonoscopy and present diagnoses.   Pt states she knows her kidneys are failing and she is not sure how to deal with her overall health issues. Pt does not express any suicidal ideation at this time.  Jian Hodgman, Laurel Dimmer, RN

## 2020-08-07 NOTE — Discharge Instructions (Signed)
Please resume your Plavix on 11/8

## 2020-08-07 NOTE — Plan of Care (Signed)
  Problem: Coping: Goal: Level of anxiety will decrease Outcome: Progressing   Problem: Pain Managment: Goal: General experience of comfort will improve Outcome: Progressing   Problem: Safety: Goal: Ability to remain free from injury will improve Outcome: Progressing   

## 2020-08-07 NOTE — Transfer of Care (Signed)
Immediate Anesthesia Transfer of Care Note  Patient: Samson Frederic Tiegs  Procedure(s) Performed: COLONOSCOPY WITH PROPOFOL (N/A ) HOT HEMOSTASIS (ARGON PLASMA COAGULATION/BICAP) (N/A ) POLYPECTOMY  Patient Location: PACU and Endoscopy Unit  Anesthesia Type:MAC  Level of Consciousness: awake, alert , oriented and patient cooperative  Airway & Oxygen Therapy: Patient Spontanous Breathing and Patient connected to face mask oxygen  Post-op Assessment: Report given to RN, Post -op Vital signs reviewed and stable and Patient moving all extremities  Post vital signs: Reviewed and stable  Last Vitals:  Vitals Value Taken Time  BP    Temp    Pulse 62 08/07/20 0853  Resp 16 08/07/20 0853  SpO2 100 % 08/07/20 0853  Vitals shown include unvalidated device data.  Last Pain:  Vitals:   08/07/20 0851  TempSrc:   PainSc: 0-No pain      Patients Stated Pain Goal: 3 (16/10/96 0454)  Complications: No complications documented.

## 2020-08-07 NOTE — Anesthesia Postprocedure Evaluation (Addendum)
Anesthesia Post Note  Patient: Engineer, site  Procedure(s) Performed: COLONOSCOPY WITH PROPOFOL (N/A ) HOT HEMOSTASIS (ARGON PLASMA COAGULATION/BICAP) (N/A ) POLYPECTOMY     Patient location during evaluation: Endoscopy Anesthesia Type: MAC Level of consciousness: awake and alert Pain management: pain level controlled Vital Signs Assessment: post-procedure vital signs reviewed and stable Respiratory status: spontaneous breathing, nonlabored ventilation, respiratory function stable and patient connected to nasal cannula oxygen Cardiovascular status: stable and blood pressure returned to baseline Postop Assessment: no apparent nausea or vomiting Anesthetic complications: yes (Left corneal abrasion)   No complications documented.  Last Vitals:  Vitals:   08/07/20 0920 08/07/20 0928  BP: (!) 199/75 (!) 180/74  Pulse: 63 70  Resp: 10 11  Temp:    SpO2: 100% 99%    Last Pain:  Vitals:   08/07/20 0947  TempSrc:   PainSc: 6                  Catalina Gravel

## 2020-08-09 ENCOUNTER — Encounter (HOSPITAL_COMMUNITY): Payer: Self-pay | Admitting: Gastroenterology

## 2020-08-09 LAB — SURGICAL PATHOLOGY

## 2020-08-10 ENCOUNTER — Ambulatory Visit: Payer: Medicare Other | Admitting: Cardiology

## 2020-08-14 ENCOUNTER — Encounter: Payer: Self-pay | Admitting: Gastroenterology

## 2020-08-15 ENCOUNTER — Telehealth: Payer: Self-pay | Admitting: Gastroenterology

## 2020-08-15 NOTE — Telephone Encounter (Signed)
Pt is requesting a call back from a nurse, pt states she needs an appt next week to follow up from the colonoscopy she had 11/7.

## 2020-08-16 DIAGNOSIS — K5520 Angiodysplasia of colon without hemorrhage: Secondary | ICD-10-CM | POA: Insufficient documentation

## 2020-08-16 NOTE — Telephone Encounter (Signed)
The pt has been scheduled to see Anderson Malta on 11/22 for post hosp GI bleed.

## 2020-08-22 ENCOUNTER — Encounter: Payer: Self-pay | Admitting: Physician Assistant

## 2020-08-22 ENCOUNTER — Other Ambulatory Visit (INDEPENDENT_AMBULATORY_CARE_PROVIDER_SITE_OTHER): Payer: Medicare Other

## 2020-08-22 ENCOUNTER — Ambulatory Visit (INDEPENDENT_AMBULATORY_CARE_PROVIDER_SITE_OTHER): Payer: Medicare Other | Admitting: Physician Assistant

## 2020-08-22 VITALS — BP 120/58 | HR 64 | Ht 60.25 in | Wt 99.5 lb

## 2020-08-22 DIAGNOSIS — K5909 Other constipation: Secondary | ICD-10-CM | POA: Diagnosis not present

## 2020-08-22 DIAGNOSIS — R1084 Generalized abdominal pain: Secondary | ICD-10-CM

## 2020-08-22 DIAGNOSIS — Z8719 Personal history of other diseases of the digestive system: Secondary | ICD-10-CM

## 2020-08-22 DIAGNOSIS — D649 Anemia, unspecified: Secondary | ICD-10-CM

## 2020-08-22 LAB — CBC WITH DIFFERENTIAL/PLATELET
Basophils Absolute: 0.2 10*3/uL — ABNORMAL HIGH (ref 0.0–0.1)
Basophils Relative: 2.4 % (ref 0.0–3.0)
Eosinophils Absolute: 0.4 10*3/uL (ref 0.0–0.7)
Eosinophils Relative: 5 % (ref 0.0–5.0)
HCT: 27.2 % — ABNORMAL LOW (ref 36.0–46.0)
Hemoglobin: 8.8 g/dL — ABNORMAL LOW (ref 12.0–15.0)
Lymphocytes Relative: 23.6 % (ref 12.0–46.0)
Lymphs Abs: 1.7 10*3/uL (ref 0.7–4.0)
MCHC: 32.1 g/dL (ref 30.0–36.0)
MCV: 80.6 fl (ref 78.0–100.0)
Monocytes Absolute: 0.6 10*3/uL (ref 0.1–1.0)
Monocytes Relative: 8.6 % (ref 3.0–12.0)
Neutro Abs: 4.5 10*3/uL (ref 1.4–7.7)
Neutrophils Relative %: 60.4 % (ref 43.0–77.0)
Platelets: 560 10*3/uL — ABNORMAL HIGH (ref 150.0–400.0)
RBC: 3.38 Mil/uL — ABNORMAL LOW (ref 3.87–5.11)
RDW: 20.5 % — ABNORMAL HIGH (ref 11.5–15.5)
WBC: 7.4 10*3/uL (ref 4.0–10.5)

## 2020-08-22 LAB — IBC + FERRITIN
Ferritin: 7.9 ng/mL — ABNORMAL LOW (ref 10.0–291.0)
Iron: 54 ug/dL (ref 42–145)
Saturation Ratios: 11.5 % — ABNORMAL LOW (ref 20.0–50.0)
Transferrin: 334 mg/dL (ref 212.0–360.0)

## 2020-08-22 MED ORDER — LINACLOTIDE 72 MCG PO CAPS: 72.0000 ug | ORAL_CAPSULE | Freq: Every day | ORAL | 2 refills | Status: DC

## 2020-08-22 NOTE — Patient Instructions (Addendum)
If you are age 57 or older, your body mass index should be between 23-30. Your Body mass index is 19.27 kg/m. If this is out of the aforementioned range listed, please consider follow up with your Primary Care Provider.  If you are age 11 or younger, your body mass index should be between 19-25. Your Body mass index is 19.27 kg/m. If this is out of the aformentioned range listed, please consider follow up with your Primary Care Provider.   Your provider has requested that you go to the basement level for lab work before leaving today. Press "B" on the elevator. The lab is located at the first door on the left as you exit the elevator.  We have sent the following medications to your pharmacy for you to pick up at your convenience: Linzess 72 mcg daily.

## 2020-08-22 NOTE — Progress Notes (Signed)
Chief Complaint: Follow-up hospitalization for GI bleed  HPI:    [name] is a 59 year old female with a past medical history as listed below including aortic stenosis, CHF, CKD stage V, COPD and others, known to Dr. Ardis Hughs, who was referred to me by Bernerd Limbo, MD for follow-up after being seen in the hospital for GI bleed.     08/05/2020 patient consulted by our service in the hospital for GI bleed.  She presented to the hospital having passed a brown stool with a very small amount of red blood having been previously constipated.  After laxatives and enema she passed a black stool.  EGD and colonoscopy were done.  Was also noted she had acute on chronic anemia, CKD and with a baseline hemoglobin 11-12 but in the ED hemoglobin was 5 improved a post 2 units of PRBCs.  Also chronic constipation frequently associated generalized left-sided abdominal pain, no acute abnormalities on noncontrast CT scan of the abdomen and pelvis.  Has history of perforated diverticulitis requiring partial colectomy in 2008.  Last colonoscopy in 2018 with recommendations for repeat in 3 years due to history of adenomas.    08/06/2020 EGD with food residue in the stomach with outlet obstruction, otherwise normal.  No upper GI bleeding.     08/07/2020 colonoscopy with a single nonbleeding colonic angiodysplastic lesion treated with APC, 1 8 mm polyp in the distal sigmoid colon, minimal neosigmoid diverticulosis, patent end-to-end colocolonic anastomosis characterized by healthy-appearing mucosa, nonbleeding internal hemorrhoids.  It was felt the patient would likely need a repeat colonoscopy in 6 months with a 2-day preparation due to poor visualization.  Pathology showed hyperplastic polyp.    08/07/2020 CBC with a hemoglobin of 8.5.    08/19/2020 hemoglobin 9.6.    Today, the patient tells me that she continues to have some generalized abdominal pain, apparently when she went into the ER she was having some left upper  quadrant pain which is not abnormal for her with her constipation but tells me now, as of this morning, she is feeling right upper quadrant pain as well described as crampy, it comes and goes and is sometimes severe.  She is using MiraLAX every day but still has decreased bowel movements which are hard to get out.  Also complains of some breakthrough reflux for which she uses Pepcid and this works.  Continues on Pantoprazole 40 mg twice daily.  Tells me she still feels weak and dizzy.    Denies fever, chills or blood in her stool.     Past Medical History:  Diagnosis Date  . Anemia 2008  . Anxiety   . Aortic stenosis   . Arthritis    "all my joints ache" (09/07/2014)  . Asthma   . Bipolar 1 disorder ([place])   . Bradycardia   . Bruit   . Carpal tunnel syndrome   . Cervical cancer ([place]) 1985  . CHF (congestive heart failure) ([place])   . Chronic kidney disease (CKD), stage V ([place])   . COPD (chronic obstructive pulmonary disease) ([place]) 2000  . Coronary artery disease   . Daily headache   . Depression 2000  . Diverticulitis 2008  . GERD (gastroesophageal reflux disease)   . Glaucoma   . Heart murmur   . History of colon polyps 2009  . HLD (hyperlipidemia) 2013  . Hypertension 2013  . Hypovitaminosis D   . IBS (irritable bowel syndrome) 2008  . Myocardial infarction ([place])    2015  . PAD (  peripheral artery disease) ([place])   . Pancreatitis 10/2011  . Pneumonia 07/2014  . RLS (restless legs syndrome)   . Schizophrenia ([place])   . Shortness of breath   . Skin cancer   . Small bowel obstruction ([place]) 2008    Past Surgical History:  Procedure Laterality Date  . ABDOMINAL ANGIOGRAM N/A 09/07/2014   Procedure: ABDOMINAL ANGIOGRAM;  Surgeon: Laverda Page, MD;  Location: [facility] CATH LAB;  Service: Cardiovascular;  Laterality: N/A;  . APPENDECTOMY  1996  . BIOPSY  08/06/2020   Procedure: BIOPSY;  Surgeon: Jackquline Denmark, MD;  Location: [facility] ENDOSCOPY;  Service: Endoscopy;;  . COLON SURGERY  2008     6 inches of colon removed due to obstruction  . COLONOSCOPY WITH PROPOFOL N/A 10/25/2016   Procedure: COLONOSCOPY WITH PROPOFOL;  Surgeon: Milus Banister, MD;  Location: [facility] ENDOSCOPY;  Service: Endoscopy;  Laterality: N/A;  . COLONOSCOPY WITH PROPOFOL N/A 08/07/2020   Procedure: COLONOSCOPY WITH PROPOFOL;  Surgeon: Jackquline Denmark, MD;  Location: [facility] ENDOSCOPY;  Service: Endoscopy;  Laterality: N/A;  . CORONARY ANGIOPLASTY WITH STENT PLACEMENT  09/07/2014   "2"  . ESOPHAGOGASTRODUODENOSCOPY (EGD) WITH PROPOFOL N/A 08/06/2020   Procedure: ESOPHAGOGASTRODUODENOSCOPY (EGD) WITH PROPOFOL;  Surgeon: Jackquline Denmark, MD;  Location: [facility] ENDOSCOPY;  Service: Endoscopy;  Laterality: N/A;  . HOT HEMOSTASIS N/A 08/07/2020   Procedure: HOT HEMOSTASIS (ARGON PLASMA COAGULATION/BICAP);  Surgeon: Jackquline Denmark, MD;  Location: [facility] ENDOS[facility];  Service: Endoscopy;  Laterality: N/A;  . LEFT HEART CATH AND CORONARY ANGIOGRAPHY N/A 03/25/2018   Procedure: LEFT HEART CATH AND CORONARY ANGIOGRAPHY;  Surgeon: Nigel Mormon, MD;  Location: [place] CV LAB;  Service: Cardiovascular;  Laterality: N/A;  . LEFT HEART CATHETERIZATION WITH CORONARY ANGIOGRAM N/A 09/07/2014   Procedure: LEFT HEART CATHETERIZATION WITH CORONARY ANGIOGRAM;  Surgeon: Laverda Page, MD;  Location: [facility] CATH LAB;  Service: Cardiovascular;  Laterality: N/A;  . LOWER EXTREMITY ANGIOGRAPHY  10/29/2017   Procedure: Lower Extremity Angiography;  Surgeon: Adrian Prows, MD;  Location: [place] CV LAB;  Service: Cardiovascular;;  . PERIPHERAL VASCULAR CATHETERIZATION N/A 11/01/2015   Procedure: Renal Angiography;  Surgeon: Adrian Prows, MD;  Location: [place] CV LAB;  Service: Cardiovascular;  Laterality: N/A;  . PERIPHERAL VASCULAR CATHETERIZATION N/A 04/10/2016   Procedure: Renal Angiography;  Surgeon: Adrian Prows, MD;  Location: [place] CV LAB;  Service: Cardiovascular;  Laterality: N/A;  . PERIPHERAL VASCULAR CATHETERIZATION  04/10/2016   Procedure:  Peripheral Vascular Intervention;  Surgeon: Adrian Prows, MD;  Location: [place] CV LAB;  Service: Cardiovascular;;  . PERIPHERAL VASCULAR INTERVENTION  10/29/2017   Procedure: PERIPHERAL VASCULAR INTERVENTION;  Surgeon: Adrian Prows, MD;  Location: [place] CV LAB;  Service: Cardiovascular;;  . POLYPECTOMY  08/07/2020   Procedure: POLYPECTOMY;  Surgeon: Jackquline Denmark, MD;  Location: [facility] ENDOSCOPY;  Service: Endoscopy;;  . RENAL ANGIOGRAPHY N/A 10/29/2017   Procedure: RENAL ANGIOGRAPHY;  Surgeon: Adrian Prows, MD;  Location: [place] CV LAB;  Service: Cardiovascular;  Laterality: N/A;  . RENAL ANGIOGRAPHY N/A 05/10/2020   Procedure: RENAL ANGIOGRAPHY;  Surgeon: Nigel Mormon, MD;  Location: [place] CV LAB;  Service: Cardiovascular;  Laterality: N/A;  . RIGHT OOPHORECTOMY Right 1996  . TOTAL ABDOMINAL HYSTERECTOMY  1997    Current Outpatient Medications  Medication Sig Dispense Refill  . acetaminophen (TYLENOL) 500 MG tablet Take 1,000 mg by mouth every 8 (eight) hours as needed for mild pain or headache.    . albuterol (PROVENTIL HFA;VENTOLIN HFA) 108 (  90 BASE) MCG/ACT inhaler Inhale 2 puffs into the lungs 2 (two) times daily as needed for wheezing or shortness of breath (asthma).     . ALPRAZolam (XANAX) 1 MG tablet Take 0.5 tablets by mouth 2 (two) times daily as needed for anxiety.     Marland Kitchen amLODipine (NORVASC) 10 MG tablet Take 1 tablet (10 mg total) by mouth daily. 30 tablet 0  . aspirin EC 81 MG tablet Take 1 tablet (81 mg total) by mouth daily. 30 tablet 2  . atenolol (TENORMIN) 25 MG tablet TAKE 1 TABLET BY MOUTH ONCE A DAY 90 tablet 1  . atorvastatin (LIPITOR) 80 MG tablet Take 80 mg by mouth daily.     Marland Kitchen azelastine (ASTELIN) 0.1 % nasal spray Place 1 spray into both nostrils daily as needed for rhinitis or allergies.     Marland Kitchen clopidogrel (PLAVIX) 75 MG tablet Take 1 tablet (75 mg total) by mouth daily. 30 tablet 2  . ezetimibe (ZETIA) 10 MG tablet Take 1 tablet (10 mg total) by  mouth daily. 90 tablet 3  . isosorbide mononitrate (IMDUR) 60 MG 24 hr tablet TAKE 1 TABLET BY MOUTH DAILY 90 tablet 3  . megestrol (MEGACE) 20 MG tablet Take 1 tablet by mouth daily.    . mometasone-formoterol (DULERA) 100-5 MCG/ACT AERO Take 2 puffs first thing in am and then another 2 puffs about 12 hours later. (Patient taking differently: Inhale 2 puffs into the lungs daily. ) 1 Inhaler 11  . nitroGLYCERIN (NITROSTAT) 0.4 MG SL tablet Place 1 tablet (0.4 mg total) under the tongue every 5 (five) minutes as needed for chest pain. 30 tablet 1  . pantoprazole (PROTONIX) 40 MG tablet TAKE 1 TABLET BY MOUTH TWICE (2) DAILY (Patient taking differently: Take 40 mg by mouth 2 (two) times daily. ) 180 tablet 2  . polyethylene glycol (MIRALAX) 17 g packet Take 17 g by mouth daily. 14 each 0  . promethazine (PHENERGAN) 25 MG tablet Take 1 tablet by mouth as needed.    . traMADol (ULTRAM) 50 MG tablet Take 1 tablet (50 mg total) by mouth every 6 (six) hours as needed. 8 tablet 0  . traZODone (DESYREL) 100 MG tablet Take 100 mg by mouth at bedtime as needed for sleep.     Marland Kitchen venlafaxine XR (EFFEXOR XR) 150 MG 24 hr capsule Take 1 capsule (150 mg total) by mouth daily with breakfast. (Patient taking differently: Take 300 mg by mouth daily with breakfast. ) 30 capsule 0   No current facility-administered medications for this visit.    Allergies as of 08/22/2020 - Review Complete 08/22/2020  Allergen Reaction Noted  . Doxycycline Anaphylaxis and Hives   . Hydralazine Rash 08/16/2020  . Hydrocodone-acetaminophen Nausea And Vomiting 06/18/2012  . Iohexol Itching 10/16/2011    Family History  Problem Relation Age of Onset  . Other Mother        many bowel obstructions  . Heart disease Mother   . Colon polyps Mother   . Kidney cancer Father   . Bone cancer Father   . Diabetes Father   . Heart disease Father   . Diabetes Daughter   . Colon cancer Paternal Grandfather   . Heart disease Brother   .  Esophageal cancer Neg Hx     Social History   Socioeconomic History  . Marital status: Divorced    Spouse name: Not on file  . Number of children: 1  . Years of education: Not on file  .  Highest education level: Not on file  Occupational History  . Occupation: Scientist, research (life sciences): UNEMPLOYED  Tobacco Use  . Smoking status: Current Every Day Smoker    Packs/day: 0.25    Years: 40.00    Pack years: 10.00    Types: Cigarettes  . Smokeless tobacco: Never Used  Vaping Use  . Vaping Use: Former  Substance and Sexual Activity  . Alcohol use: No  . Drug use: Yes    Frequency: 3.0 times per week    Types: Marijuana    Comment: last use yesterday  . Sexual activity: Yes    Birth control/protection: Post-menopausal  Other Topics Concern  . Not on file  Social History Narrative  . Not on file   Social Determinants of Health   Financial Resource Strain:   . Difficulty of Paying Living Expenses: Not on file  Food Insecurity:   . Worried About Charity fundraiser in the Last Year: Not on file  . Ran Out of Food in the Last Year: Not on file  Transportation Needs:   . Lack of Transportation (Medical): Not on file  . Lack of Transportation (Non-Medical): Not on file  Physical Activity:   . Days of Exercise per Week: Not on file  . Minutes of Exercise per Session: Not on file  Stress:   . Feeling of Stress : Not on file  Social Connections:   . Frequency of Communication with Friends and Family: Not on file  . Frequency of Social Gatherings with Friends and Family: Not on file  . Attends Religious Services: Not on file  . Active Member of Clubs or Organizations: Not on file  . Attends Archivist Meetings: Not on file  . Marital Status: Not on file  Intimate Partner Violence:   . Fear of Current or Ex-Partner: Not on file  . Emotionally Abused: Not on file  . Physically Abused: Not on file  . Sexually Abused: Not on file    Review of Systems:      Constitutional: No weight loss, fever or chills Cardiovascular: No chest pain Respiratory: No SOB  Gastrointestinal: See HPI and otherwise negative   Physical Exam:  Vital signs: BP (!) 120/58 (BP Location: Left Arm, Patient Position: Sitting, Cuff Size: Normal)   Pulse 64   Ht 5' 0.25" (1.53 m) Comment: height measured without shoes  Wt 99 lb 8 oz (45.1 kg)   BMI 19.27 kg/m    Constitutional:   Pleasant Caucasian female appears to be in NAD, Well developed, Well nourished, alert and cooperative Respiratory: Respirations even and unlabored. Lungs clear to auscultation bilaterally.   No wheezes, crackles, or rhonchi.  Cardiovascular: Normal S1, S2. No MRG. Regular rate and rhythm. No peripheral edema, cyanosis or pallor.  Gastrointestinal:  Soft, nondistended, mild generalized TTP, somewhat worse in left upper quadrant. No rebound or guarding. Normal bowel sounds. No appreciable masses or hepatomegaly. Rectal:  Not performed.  Psychiatric: Demonstrates good judgement and reason without abnormal affect or behaviors.  RELEVANT LABS AND IMAGING: CBC    Component Value Date/Time   WBC 6.6 08/07/2020 0657   RBC 3.35 (L) 08/07/2020 0657   HGB 8.5 (L) 08/07/2020 0657   HGB 10.8 (L) 03/31/2020 1336   HCT 28.7 (L) 08/07/2020 0657   HCT 35.1 03/31/2020 1336   PLT 293 08/07/2020 0657   PLT 405 03/31/2020 1336   MCV 85.7 08/07/2020 0657   MCV 89 03/31/2020 1336   MCV 89  06/25/2014 1034   MCH 25.4 (L) 08/07/2020 0657   MCHC 29.6 (L) 08/07/2020 0657   RDW 16.8 (H) 08/07/2020 0657   RDW 18.0 (H) 03/31/2020 1336   RDW 16.8 (H) 06/25/2014 1034   LYMPHSABS 1.5 08/04/2020 2211   MONOABS 0.6 08/04/2020 2211   EOSABS 0.2 08/04/2020 2211   BASOSABS 0.1 08/04/2020 2211    CMP     Component Value Date/Time   NA 137 08/07/2020 0657   NA 141 06/24/2020 1646   NA 134 (L) 06/25/2014 1034   K 4.2 08/07/2020 0657   K 4.8 06/25/2014 1034   CL 100 08/07/2020 0657   CL 102 06/25/2014 1034    CO2 28 08/07/2020 0657   CO2 28 06/25/2014 1034   GLUCOSE 97 08/07/2020 0657   GLUCOSE 90 06/25/2014 1034   BUN 12 08/07/2020 0657   BUN 27 (H) 06/24/2020 1646   BUN 14 06/25/2014 1034   CREATININE 1.92 (H) 08/07/2020 0657   CREATININE 1.23 06/25/2014 1034   CALCIUM 9.4 08/07/2020 0657   CALCIUM 9.1 06/25/2014 1034   PROT 6.7 08/07/2020 0657   PROT 7.5 06/25/2014 1034   ALBUMIN 3.4 (L) 08/07/2020 0657   ALBUMIN 2.8 (L) 06/25/2014 1034   AST 24 08/07/2020 0657   AST 21 06/25/2014 1034   ALT 16 08/07/2020 0657   ALT 14 06/25/2014 1034   ALKPHOS 94 08/07/2020 0657   ALKPHOS 102 06/25/2014 1034   BILITOT 0.9 08/07/2020 0657   BILITOT 0.2 06/25/2014 1034   GFRNONAA 30 (L) 08/07/2020 0657   GFRNONAA 49 (L) 06/25/2014 1034   GFRNONAA >60 06/07/2012 1350   GFRAA 25 (L) 06/24/2020 1646   GFRAA 59 (L) 06/25/2014 1034   GFRAA >60 06/07/2012 1350    Assessment: 1.  Acute on chronic anemia: Hemoglobin now 9.6, recent hospitalization for GI bleed with finding of an angiodysplastic lesion in the colon treated with APC 2.  History of GI bleed 3.  Abdominal pain: Chronic left-sided pain for the patient with constipation 4.  Constipation  Plan: 1.  Patient's hemoglobin is still increasing since time of last hospitalization, now 9.6.  Discussed with the patient that hopefully the lesion we found that was bleeding was the source and she will continue to see increase in hemoglobin. 2.  Discussed that it is not completely abnormal to feel dizzy and tired with a hemoglobin of 9.6, would hope that this would continue to get better.  Did encourage her to start iron as recommended by her PCP. 3.  Recheck CBC and iron studies today 4.  We will start the patient on Linzess 72 mcg daily.  Patient was given samples and a prescription #30 with 2 refills.  Discussed that she may not have to use MiraLAX with this. 5.  Continue Pantoprazole 40 mg twice daily and Pepcid as needed for breakthrough symptoms 6.   Patient to follow in clinic per recommendations after labs above.  She also needs a repeat colonoscopy in 6 months per Dr. Lyndel Safe with a 2-day bowel prep.  This would be with Dr. Ardis Hughs.  Ellouise Newer, PA-C [facility] 08/22/2020, 1:26 PM  Cc: Bernerd Limbo, MD

## 2020-08-23 ENCOUNTER — Encounter (HOSPITAL_COMMUNITY): Payer: Self-pay

## 2020-08-23 ENCOUNTER — Other Ambulatory Visit: Payer: Self-pay

## 2020-08-23 ENCOUNTER — Telehealth: Payer: Self-pay | Admitting: Physician Assistant

## 2020-08-23 ENCOUNTER — Inpatient Hospital Stay (HOSPITAL_COMMUNITY)
Admission: EM | Admit: 2020-08-23 | Discharge: 2020-08-27 | DRG: 378 | Disposition: A | Discharge status: Home / Self Care | Payer: Medicare Other | Attending: Family Medicine | Admitting: Family Medicine

## 2020-08-23 ENCOUNTER — Observation Stay (HOSPITAL_COMMUNITY): Payer: Medicare Other

## 2020-08-23 DIAGNOSIS — I350 Nonrheumatic aortic (valve) stenosis: Secondary | ICD-10-CM | POA: Diagnosis present

## 2020-08-23 DIAGNOSIS — K31811 Angiodysplasia of stomach and duodenum with bleeding: Secondary | ICD-10-CM | POA: Diagnosis not present

## 2020-08-23 DIAGNOSIS — Z9049 Acquired absence of other specified parts of digestive tract: Secondary | ICD-10-CM

## 2020-08-23 DIAGNOSIS — Z833 Family history of diabetes mellitus: Secondary | ICD-10-CM

## 2020-08-23 DIAGNOSIS — K5730 Diverticulosis of large intestine without perforation or abscess without bleeding: Secondary | ICD-10-CM | POA: Diagnosis not present

## 2020-08-23 DIAGNOSIS — F411 Generalized anxiety disorder: Secondary | ICD-10-CM | POA: Diagnosis present

## 2020-08-23 DIAGNOSIS — K922 Gastrointestinal hemorrhage, unspecified: Principal | ICD-10-CM | POA: Diagnosis present

## 2020-08-23 DIAGNOSIS — F17210 Nicotine dependence, cigarettes, uncomplicated: Secondary | ICD-10-CM | POA: Diagnosis present

## 2020-08-23 DIAGNOSIS — I130 Hypertensive heart and chronic kidney disease with heart failure and stage 1 through stage 4 chronic kidney disease, or unspecified chronic kidney disease: Secondary | ICD-10-CM | POA: Diagnosis present

## 2020-08-23 DIAGNOSIS — K635 Polyp of colon: Secondary | ICD-10-CM

## 2020-08-23 DIAGNOSIS — Z8051 Family history of malignant neoplasm of kidney: Secondary | ICD-10-CM

## 2020-08-23 DIAGNOSIS — K921 Melena: Secondary | ICD-10-CM | POA: Diagnosis not present

## 2020-08-23 DIAGNOSIS — Z8371 Family history of colonic polyps: Secondary | ICD-10-CM

## 2020-08-23 DIAGNOSIS — Z8249 Family history of ischemic heart disease and other diseases of the circulatory system: Secondary | ICD-10-CM

## 2020-08-23 DIAGNOSIS — D62 Acute posthemorrhagic anemia: Secondary | ICD-10-CM | POA: Diagnosis not present

## 2020-08-23 DIAGNOSIS — J449 Chronic obstructive pulmonary disease, unspecified: Secondary | ICD-10-CM | POA: Diagnosis present

## 2020-08-23 DIAGNOSIS — N183 Chronic kidney disease, stage 3 unspecified: Secondary | ICD-10-CM | POA: Diagnosis present

## 2020-08-23 DIAGNOSIS — R1010 Upper abdominal pain, unspecified: Secondary | ICD-10-CM | POA: Diagnosis not present

## 2020-08-23 DIAGNOSIS — K5521 Angiodysplasia of colon with hemorrhage: Secondary | ICD-10-CM

## 2020-08-23 DIAGNOSIS — N184 Chronic kidney disease, stage 4 (severe): Secondary | ICD-10-CM | POA: Diagnosis present

## 2020-08-23 DIAGNOSIS — Z955 Presence of coronary angioplasty implant and graft: Secondary | ICD-10-CM

## 2020-08-23 DIAGNOSIS — I5032 Chronic diastolic (congestive) heart failure: Secondary | ICD-10-CM | POA: Diagnosis present

## 2020-08-23 DIAGNOSIS — K219 Gastro-esophageal reflux disease without esophagitis: Secondary | ICD-10-CM | POA: Diagnosis present

## 2020-08-23 DIAGNOSIS — Z20822 Contact with and (suspected) exposure to covid-19: Secondary | ICD-10-CM | POA: Diagnosis present

## 2020-08-23 DIAGNOSIS — I252 Old myocardial infarction: Secondary | ICD-10-CM

## 2020-08-23 DIAGNOSIS — K648 Other hemorrhoids: Secondary | ICD-10-CM | POA: Diagnosis present

## 2020-08-23 DIAGNOSIS — I2510 Atherosclerotic heart disease of native coronary artery without angina pectoris: Secondary | ICD-10-CM | POA: Diagnosis present

## 2020-08-23 DIAGNOSIS — Z7982 Long term (current) use of aspirin: Secondary | ICD-10-CM

## 2020-08-23 DIAGNOSIS — I739 Peripheral vascular disease, unspecified: Secondary | ICD-10-CM | POA: Diagnosis present

## 2020-08-23 DIAGNOSIS — I10 Essential (primary) hypertension: Secondary | ICD-10-CM | POA: Diagnosis present

## 2020-08-23 DIAGNOSIS — Z800 Family history of malignant neoplasm of digestive organs: Secondary | ICD-10-CM

## 2020-08-23 DIAGNOSIS — E785 Hyperlipidemia, unspecified: Secondary | ICD-10-CM | POA: Diagnosis present

## 2020-08-23 DIAGNOSIS — N1832 Chronic kidney disease, stage 3b: Secondary | ICD-10-CM | POA: Diagnosis present

## 2020-08-23 LAB — HEMOGLOBIN AND HEMATOCRIT, BLOOD
HCT: 25.8 % — ABNORMAL LOW (ref 36.0–46.0)
HCT: 26.9 % — ABNORMAL LOW (ref 36.0–46.0)
Hemoglobin: 7.4 g/dL — ABNORMAL LOW (ref 12.0–15.0)
Hemoglobin: 7.6 g/dL — ABNORMAL LOW (ref 12.0–15.0)

## 2020-08-23 LAB — CBC WITH DIFFERENTIAL/PLATELET
Abs Immature Granulocytes: 0.02 10*3/uL (ref 0.00–0.07)
Basophils Absolute: 0.1 10*3/uL (ref 0.0–0.1)
Basophils Relative: 2 %
Eosinophils Absolute: 0.2 10*3/uL (ref 0.0–0.5)
Eosinophils Relative: 3 %
HCT: 26.3 % — ABNORMAL LOW (ref 36.0–46.0)
Hemoglobin: 7.9 g/dL — ABNORMAL LOW (ref 12.0–15.0)
Immature Granulocytes: 0 %
Lymphocytes Relative: 18 %
Lymphs Abs: 1.3 10*3/uL (ref 0.7–4.0)
MCH: 25.8 pg — ABNORMAL LOW (ref 26.0–34.0)
MCHC: 30 g/dL (ref 30.0–36.0)
MCV: 85.9 fL (ref 80.0–100.0)
Monocytes Absolute: 0.4 10*3/uL (ref 0.1–1.0)
Monocytes Relative: 6 %
Neutro Abs: 4.8 10*3/uL (ref 1.7–7.7)
Neutrophils Relative %: 71 %
Platelets: 408 10*3/uL — ABNORMAL HIGH (ref 150–400)
RBC: 3.06 MIL/uL — ABNORMAL LOW (ref 3.87–5.11)
RDW: 19.8 % — ABNORMAL HIGH (ref 11.5–15.5)
WBC: 6.8 10*3/uL (ref 4.0–10.5)
nRBC: 0 % (ref 0.0–0.2)

## 2020-08-23 LAB — COMPREHENSIVE METABOLIC PANEL
ALT: 12 U/L (ref 0–44)
AST: 21 U/L (ref 15–41)
Albumin: 3.3 g/dL — ABNORMAL LOW (ref 3.5–5.0)
Alkaline Phosphatase: 96 U/L (ref 38–126)
Anion gap: 8 (ref 5–15)
BUN: 20 mg/dL (ref 6–20)
CO2: 24 mmol/L (ref 22–32)
Calcium: 8.8 mg/dL — ABNORMAL LOW (ref 8.9–10.3)
Chloride: 105 mmol/L (ref 98–111)
Creatinine, Ser: 1.9 mg/dL — ABNORMAL HIGH (ref 0.44–1.00)
GFR, Estimated: 30 mL/min — ABNORMAL LOW (ref >60)
Glucose, Bld: 105 mg/dL — ABNORMAL HIGH (ref 70–99)
Potassium: 4.5 mmol/L (ref 3.5–5.1)
Sodium: 137 mmol/L (ref 135–145)
Total Bilirubin: 0.5 mg/dL (ref 0.3–1.2)
Total Protein: 6.7 g/dL (ref 6.5–8.1)

## 2020-08-23 LAB — LACTIC ACID, PLASMA: Lactic Acid, Venous: 1 mmol/L (ref 0.5–1.9)

## 2020-08-23 LAB — PROTIME-INR
INR: 0.9 (ref 0.8–1.2)
Prothrombin Time: 12.2 seconds (ref 11.4–15.2)

## 2020-08-23 LAB — LIPASE, BLOOD: Lipase: 42 U/L (ref 11–51)

## 2020-08-23 LAB — RESP PANEL BY RT-PCR (FLU A&B, COVID) ARPGX2
Influenza A by PCR: NEGATIVE
Influenza B by PCR: NEGATIVE
SARS Coronavirus 2 by RT PCR: NEGATIVE

## 2020-08-23 LAB — POC OCCULT BLOOD, ED: Fecal Occult Bld: POSITIVE — AB

## 2020-08-23 LAB — AMYLASE: Amylase: 81 U/L (ref 28–100)

## 2020-08-23 MED ORDER — VENLAFAXINE HCL ER 150 MG PO CP24
300.0000 mg | ORAL_CAPSULE | Freq: Every day | ORAL | Status: DC
  Administered 2020-08-25 – 2020-08-27 (×3): 300 mg via ORAL
  Filled 2020-08-23 (×4): qty 2

## 2020-08-23 MED ORDER — AMLODIPINE BESYLATE 10 MG PO TABS
10.0000 mg | ORAL_TABLET | Freq: Every day | ORAL | Status: DC
  Administered 2020-08-23 – 2020-08-27 (×5): 10 mg via ORAL
  Filled 2020-08-23 (×4): qty 1

## 2020-08-23 MED ORDER — ONDANSETRON HCL 4 MG PO TABS: 4.0000 mg | ORAL_TABLET | Freq: Four times a day (QID) | ORAL | Status: DC | PRN

## 2020-08-23 MED ORDER — PANTOPRAZOLE SODIUM 40 MG IV SOLR
40.0000 mg | Freq: Once | INTRAVENOUS | Status: AC
  Administered 2020-08-23: 40 mg via INTRAVENOUS
  Filled 2020-08-23: qty 40

## 2020-08-23 MED ORDER — PANTOPRAZOLE SODIUM 40 MG IV SOLR
40.0000 mg | Freq: Two times a day (BID) | INTRAVENOUS | Status: DC
  Administered 2020-08-23 – 2020-08-26 (×7): 40 mg via INTRAVENOUS
  Filled 2020-08-23 (×8): qty 40

## 2020-08-23 MED ORDER — ATORVASTATIN CALCIUM 40 MG PO TABS
80.0000 mg | ORAL_TABLET | Freq: Every day | ORAL | Status: DC
  Administered 2020-08-23 – 2020-08-27 (×5): 80 mg via ORAL
  Filled 2020-08-23 (×5): qty 2

## 2020-08-23 MED ORDER — SODIUM CHLORIDE 0.9 % IV SOLN: INTRAVENOUS | Status: DC

## 2020-08-23 MED ORDER — FENTANYL CITRATE (PF) 100 MCG/2ML IJ SOLN
12.5000 ug | INTRAMUSCULAR | Status: DC | PRN
  Administered 2020-08-23 – 2020-08-25 (×6): 12.5 ug via INTRAVENOUS
  Filled 2020-08-23 (×6): qty 2

## 2020-08-23 MED ORDER — ONDANSETRON HCL 4 MG/2ML IJ SOLN
4.0000 mg | Freq: Four times a day (QID) | INTRAMUSCULAR | Status: DC | PRN
  Administered 2020-08-24 – 2020-08-26 (×2): 4 mg via INTRAVENOUS
  Filled 2020-08-23 (×2): qty 2

## 2020-08-23 MED ORDER — HYDROMORPHONE HCL 1 MG/ML IJ SOLN
1.0000 mg | Freq: Once | INTRAMUSCULAR | Status: AC
  Administered 2020-08-23: 1 mg via INTRAVENOUS
  Filled 2020-08-23: qty 1

## 2020-08-23 MED ORDER — ISOSORBIDE MONONITRATE ER 60 MG PO TB24
60.0000 mg | ORAL_TABLET | Freq: Every day | ORAL | Status: DC
  Administered 2020-08-23 – 2020-08-27 (×5): 60 mg via ORAL
  Filled 2020-08-23 (×4): qty 1

## 2020-08-23 MED ORDER — PEG-KCL-NACL-NASULF-NA ASC-C 100 G PO SOLR
0.5000 | Freq: Once | ORAL | Status: AC
  Administered 2020-08-23: 100 g via ORAL
  Filled 2020-08-23: qty 1

## 2020-08-23 MED ORDER — MOMETASONE FURO-FORMOTEROL FUM 100-5 MCG/ACT IN AERO
2.0000 | INHALATION_SPRAY | Freq: Two times a day (BID) | RESPIRATORY_TRACT | Status: DC
  Administered 2020-08-23 – 2020-08-26 (×6): 2 via RESPIRATORY_TRACT
  Filled 2020-08-23: qty 8.8

## 2020-08-23 MED ORDER — MORPHINE SULFATE (PF) 4 MG/ML IV SOLN
4.0000 mg | Freq: Once | INTRAVENOUS | Status: AC
  Administered 2020-08-23: 4 mg via INTRAVENOUS
  Filled 2020-08-23: qty 1

## 2020-08-23 MED ORDER — AZELASTINE HCL 0.1 % NA SOLN: 1.0000 | Freq: Every day | NASAL | Status: DC | PRN

## 2020-08-23 MED ORDER — EZETIMIBE 10 MG PO TABS
10.0000 mg | ORAL_TABLET | Freq: Every day | ORAL | Status: DC
  Administered 2020-08-23 – 2020-08-27 (×5): 10 mg via ORAL
  Filled 2020-08-23 (×5): qty 1

## 2020-08-23 MED ORDER — ALPRAZOLAM 0.5 MG PO TABS
0.5000 mg | ORAL_TABLET | Freq: Every day | ORAL | Status: DC | PRN
  Administered 2020-08-23 – 2020-08-25 (×2): 0.5 mg via ORAL
  Filled 2020-08-23 (×3): qty 1

## 2020-08-23 MED ORDER — ONDANSETRON HCL 4 MG/2ML IJ SOLN
4.0000 mg | Freq: Once | INTRAMUSCULAR | Status: AC
  Administered 2020-08-23: 4 mg via INTRAVENOUS
  Filled 2020-08-23: qty 2

## 2020-08-23 NOTE — Progress Notes (Signed)
Patient noted with spo2 of 87-89 on RA. Placed on 2L via [place] and currently is 95%. Remains alert and oriented X4. Skin is warm and dry but pale. She has completed taking the bowel prep and has been having loose, bloody bowels.  Will continue to monitor patient.

## 2020-08-23 NOTE — Progress Notes (Signed)
I agree with the above note and plan.

## 2020-08-23 NOTE — ED Provider Notes (Signed)
[facility] DEPT Provider Note   CSN: [id number] Arrival date & time: 08/23/20  1025     History No chief complaint on file.   [name] is a 59 y.o. female who is a past medical history of aortic stenosis, CHF, CKD stage V, COPD history of perforated diverticulitis requiring partial colectomy. She follows with Dr. Ardis Hughs of [facility] gastroenterology. She presents emergency department with chief complaint of bright red blood per rectum. The patient was admitted to the hospital on 11 5 after being found to have a hemoglobin down to 5, needing blood transfusion having a very small amount of red blood with one episode of black stool. She had an EGD on 11/ 6 with no significant abnormality, and on 11 7 underwent a colonoscopy while admitted with a single nonbleeding colonic angiodysplastic lesion treated with APC, 1, 8 mm polyp in the distal sigmoid colon, minimal diverticulosis and healthy-appearing colonic anastomosis with nonbleeding internal hemorrhoids. Patient's hemoglobin 8.8 yesterday drawn during outpatient visit with her gastroenterology. Patient complains of diffuse abdominal pain, no nausea vomiting. She had 6 episodes of bright red blood per rectum. She states that it woke her from her sleep, she had urgency to defecate and filled the toilet bowl with blood and clots. There was no stool in the first 6 bowel movements. She had her last pill of bowel movement had blood and stool mixed together. She is feeling weak and lightheaded.  HPI     Past Medical History:  Diagnosis Date  . Anemia 2008  . Anxiety   . Aortic stenosis   . Arthritis    "all my joints ache" (09/07/2014)  . Asthma   . Bipolar 1 disorder ([place])   . Bradycardia   . Bruit   . Carpal tunnel syndrome   . Cervical cancer ([place]) 1985  . CHF (congestive heart failure) ([place])   . Chronic kidney disease (CKD), stage V ([place])   . COPD (chronic obstructive pulmonary disease) ([place])  2000  . Coronary artery disease   . Daily headache   . Depression 2000  . Diverticulitis 2008  . GERD (gastroesophageal reflux disease)   . Glaucoma   . Heart murmur   . History of colon polyps 2009  . HLD (hyperlipidemia) 2013  . Hypertension 2013  . Hypovitaminosis D   . IBS (irritable bowel syndrome) 2008  . Myocardial infarction ([place])    2015  . PAD (peripheral artery disease) ([place])   . Pancreatitis 10/2011  . Pneumonia 07/2014  . RLS (restless legs syndrome)   . Schizophrenia ([place])   . Shortness of breath   . Skin cancer   . Small bowel obstruction ([place]) 2008    Patient Active Problem List   Diagnosis Date Noted  . GIB (gastrointestinal bleeding) 08/23/2020  . GI bleed 08/06/2020  . Acute GI bleeding 08/05/2020  . Acute posthemorrhagic anemia 08/05/2020  . Renal artery stenosis ([place]) 05/10/2020  . Abdominal aortic stenosis 11/30/2018  . Pure hypercholesterolemia 10/22/2018  . Abnormal stress test 03/21/2018  . At high risk for injury related to fall 12/26/2017  . Renal artery stenosis, native, bilateral ([place]) 10/27/2017  . Benign neoplasm of transverse colon   . Benign neoplasm of descending colon   . Benign neoplasm of sigmoid colon   . Abnormality on screening test 06/22/2016  . Chest pain 11/01/2015  . History of partial colectomy 10/19/2014  . 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 Anemia, chronic disease 07/30/2014  . Heart  failure with preserved ejection fraction ([place]) 07/02/2014  . COPD GOLD 0 / still smoking 07/02/2014  . CKD (chronic kidney disease) stage 3, GFR 30-59 ml/min (HCC) 07/02/2014  . Protein-calorie malnutrition, severe ([place]) 07/02/2014  . Hyperparathyroidism, secondary renal ([place]) 04/30/2014  . Postsurgical menopause 08/14/2013  . Vaginal atrophy 08/14/2013  . Screening for breast cancer 07/21/2012  . Diverticulitis of colon with perforation 06/18/2012  . Affective bipolar disorder ([place]) 02/02/2009  . ADHESIONS, INTESTINAL  W/OBSTRUCTION 08/10/2008  . Irritable bowel syndrome 08/10/2008  . Abdominal pain, generalized 08/10/2008  . FECAL OCCULT BLOOD 08/10/2008  . Anemia, iron deficiency 08/10/2008  . Anxiety state 08/09/2008  . DEPRESSION 08/09/2008  . Essential hypertension 08/09/2008  . ASTHMA 08/09/2008  . ESOPHAGEAL STRICTURE 08/09/2008  . GERD 08/09/2008  . HIATAL HERNIA 08/09/2008  . Diverticulosis of large intestine 08/09/2008  . ARTHRITIS 08/09/2008  . HEADACHE, CHRONIC 08/09/2008  . SKIN CANCER, HX OF 08/09/2008  . COLONIC POLYPS, HYPERPLASTIC, HX OF 08/09/2008  . Cigarette smoker 05/17/2008  . Schizophrenia ([place]) 04/30/2008    Past Surgical History:  Procedure Laterality Date  . ABDOMINAL ANGIOGRAM N/A 09/07/2014   Procedure: ABDOMINAL ANGIOGRAM;  Surgeon: Laverda Page, MD;  Location: [facility] CATH LAB;  Service: Cardiovascular;  Laterality: N/A;  . APPENDECTOMY  1996  . BIOPSY  08/06/2020   Procedure: BIOPSY;  Surgeon: Jackquline Denmark, MD;  Location: [facility] ENDOSCOPY;  Service: Endoscopy;;  . COLON SURGERY  2008   6 inches of colon removed due to obstruction  . COLONOSCOPY WITH PROPOFOL N/A 10/25/2016   Procedure: COLONOSCOPY WITH PROPOFOL;  Surgeon: Milus Banister, MD;  Location: [facility] ENDOSCOPY;  Service: Endoscopy;  Laterality: N/A;  . COLONOSCOPY WITH PROPOFOL N/A 08/07/2020   Procedure: COLONOSCOPY WITH PROPOFOL;  Surgeon: Jackquline Denmark, MD;  Location: [facility] ENDOSCOPY;  Service: Endoscopy;  Laterality: N/A;  . CORONARY ANGIOPLASTY WITH STENT PLACEMENT  09/07/2014   "2"  . ESOPHAGOGASTRODUODENOSCOPY (EGD) WITH PROPOFOL N/A 08/06/2020   Procedure: ESOPHAGOGASTRODUODENOSCOPY (EGD) WITH PROPOFOL;  Surgeon: Jackquline Denmark, MD;  Location: [facility] ENDOSCOPY;  Service: Endoscopy;  Laterality: N/A;  . HOT HEMOSTASIS N/A 08/07/2020   Procedure: HOT HEMOSTASIS (ARGON PLASMA COAGULATION/BICAP);  Surgeon: Jackquline Denmark, MD;  Location: [facility] ENDOSCOPY;  Service: Endoscopy;  Laterality: N/A;  . LEFT HEART CATH AND CORONARY  ANGIOGRAPHY N/A 03/25/2018   Procedure: LEFT HEART CATH AND CORONARY ANGIOGRAPHY;  Surgeon: Nigel Mormon, MD;  Location: [facility] CV LAB;  Service: Cardiovascular;  Laterality: N/A;  . LEFT HEART CATHETERIZATION WITH CORONARY ANGIOGRAM N/A 09/07/2014   Procedure: LEFT HEART CATHETERIZATION WITH CORONARY ANGIOGRAM;  Surgeon: Laverda Page, MD;  Location: [facility] CATH LAB;  Service: Cardiovascular;  Laterality: N/A;  . LOWER EXTREMITY ANGIOGRAPHY  10/29/2017   Procedure: Lower Extremity Angiography;  Surgeon: Adrian Prows, MD;  Location: [facility] CV LAB;  Service: Cardiovascular;;  . PERIPHERAL VASCULAR CATHETERIZATION N/A 11/01/2015   Procedure: Renal Angiography;  Surgeon: Adrian Prows, MD;  Location: [place] CV LAB;  Service: Cardiovascular;  Laterality: N/A;  . PERIPHERAL VASCULAR CATHETERIZATION N/A 04/10/2016   Procedure: Renal Angiography;  Surgeon: Adrian Prows, MD;  Location: [facility] CV LAB;  Service: Cardiovascular;  Laterality: N/A;  . PERIPHERAL VASCULAR CATHETERIZATION  04/10/2016   Procedure: Peripheral Vascular Intervention;  Surgeon: Adrian Prows, MD;  Location: [place] CV LAB;  Service: Cardiovascular;;  . PERIPHERAL VASCULAR INTERVENTION  10/29/2017   Procedure: PERIPHERAL VASCULAR INTERVENTION;  Surgeon: Adrian Prows, MD;  Location: [place] CV LAB;  Service: Cardiovascular;;  .  POLYPECTOMY  08/07/2020   Procedure: POLYPECTOMY;  Surgeon: Jackquline Denmark, MD;  Location: [facility] ENDOSCOPY;  Service: Endoscopy;;  . RENAL ANGIOGRAPHY N/A 10/29/2017   Procedure: RENAL ANGIOGRAPHY;  Surgeon: Adrian Prows, MD;  Location: [facility] CV LAB;  Service: Cardiovascular;  Laterality: N/A;  . RENAL ANGIOGRAPHY N/A 05/10/2020   Procedure: RENAL ANGIOGRAPHY;  Surgeon: Nigel Mormon, MD;  Location: [facility] CV LAB;  Service: Cardiovascular;  Laterality: N/A;  . RIGHT OOPHORECTOMY Right 1996  . TOTAL ABDOMINAL HYSTERECTOMY  1997     OB History    Gravida  2   Para      Term       Preterm      AB  1   Living  1     SAB      TAB      Ectopic  1   Multiple      Live Births  1           Family History  Problem Relation Age of Onset  . Other Mother        many bowel obstructions  . Heart disease Mother   . Colon polyps Mother   . Kidney cancer Father   . Bone cancer Father   . Diabetes Father   . Heart disease Father   . Diabetes Daughter   . Colon cancer Paternal Grandfather   . Heart disease Brother   . Esophageal cancer Neg Hx     Social History   Tobacco Use  . Smoking status: Current Every Day Smoker    Packs/day: 0.25    Years: 40.00    Pack years: 10.00    Types: Cigarettes  . Smokeless tobacco: Never Used  Vaping Use  . Vaping Use: Former  Substance Use Topics  . Alcohol use: No  . Drug use: Yes    Frequency: 3.0 times per week    Types: Marijuana    Comment: last use yesterday    Home Medications Prior to Admission medications   Medication Sig Start Date End Date Taking? Authorizing Provider  acetaminophen (TYLENOL) 500 MG tablet Take 1,000 mg by mouth every 8 (eight) hours as needed for mild pain or headache.   Yes [provider]  ALPRAZolam Duanne Moron) 1 MG tablet Take 0.5 tablets by mouth daily as needed for anxiety.  04/15/17  Yes [provider]  amLODipine (NORVASC) 10 MG tablet Take 1 tablet (10 mg total) by mouth daily. 08/08/20 09/07/20 Yes Donne Hazel, MD  atenolol (TENORMIN) 25 MG tablet TAKE 1 TABLET BY MOUTH ONCE A DAY Patient taking differently: Take 25 mg by mouth daily.  08/04/20  Yes Cantwell, Celeste C, PA-C  atorvastatin (LIPITOR) 80 MG tablet Take 80 mg by mouth daily.    Yes [provider]  azelastine (ASTELIN) 0.1 % nasal spray Place 1 spray into both nostrils daily as needed for rhinitis or allergies.  06/24/19 08/23/20 Yes [provider]  clopidogrel (PLAVIX) 75 MG tablet Take 1 tablet (75 mg total) by mouth daily. 08/08/20 08/08/21 Yes Donne Hazel, MD    ezetimibe (ZETIA) 10 MG tablet Take 1 tablet (10 mg total) by mouth daily. 03/24/20  Yes Adrian Prows, MD  isosorbide mononitrate (IMDUR) 60 MG 24 hr tablet TAKE 1 TABLET BY MOUTH DAILY Patient taking differently: Take 60 mg by mouth daily.  07/18/20  Yes Cantwell, Celeste C, PA-C  mometasone-formoterol (DULERA) 100-5 MCG/ACT AERO Take 2 puffs first thing in am and  then another 2 puffs about 12 hours later. Patient taking differently: Inhale 2 puffs into the lungs in the morning and at bedtime.  04/26/17  Yes Tanda Rockers, MD  nitroGLYCERIN (NITROSTAT) 0.4 MG SL tablet Place 1 tablet (0.4 mg total) under the tongue every 5 (five) minutes as needed for chest pain. 10/15/19  Yes Miquel Dunn, NP  pantoprazole (PROTONIX) 40 MG tablet TAKE 1 TABLET BY MOUTH TWICE (2) DAILY Patient taking differently: Take 80 mg by mouth daily.  06/08/20  Yes Adrian Prows, MD  polyethylene glycol (MIRALAX) 17 g packet Take 17 g by mouth daily. 08/07/20  Yes Donne Hazel, MD  promethazine (PHENERGAN) 25 MG tablet Take 1 tablet by mouth daily as needed for nausea or vomiting.  08/16/20  Yes [provider]  traMADol (ULTRAM) 50 MG tablet Take 1 tablet (50 mg total) by mouth every 6 (six) hours as needed. Patient taking differently: Take 50 mg by mouth every 6 (six) hours as needed for moderate pain.  08/07/20  Yes Donne Hazel, MD  venlafaxine XR (EFFEXOR XR) 150 MG 24 hr capsule Take 1 capsule (150 mg total) by mouth daily with breakfast. Patient taking differently: Take 300 mg by mouth daily with breakfast.  11/09/15  Yes Patel-Mills, Orvil Feil, PA-C  albuterol (PROVENTIL HFA;VENTOLIN HFA) 108 (90 BASE) MCG/ACT inhaler Inhale 2 puffs into the lungs 2 (two) times daily as needed for wheezing or shortness of breath (asthma).     [provider]  aspirin EC 81 MG tablet Take 1 tablet (81 mg total) by mouth daily. 05/10/20 05/10/21  Patwardhan, Reynold Bowen, MD  linaclotide (LINZESS) 72 MCG capsule Take 1  capsule (72 mcg total) by mouth daily before breakfast. 08/22/20   Levin Erp, PA  megestrol (MEGACE) 20 MG tablet Take 1 tablet by mouth daily. 08/16/20 08/16/21  [provider]    Allergies    Doxycycline, Hydralazine, Hydrocodone-acetaminophen, and Iohexol  Review of Systems   Review of Systems Ten systems reviewed and are negative for acute change, except as noted in the HPI.   Physical Exam Updated Vital Signs BP 139/69 (BP Location: Right Arm)   Pulse 61   Temp 97.8 F (36.6 C)   Resp 16   Ht 5' 0.25" (1.53 m)   Wt 44.9 kg   SpO2 93%   BMI 19.17 kg/m   Physical Exam Vitals and nursing note reviewed.  Constitutional:      General: She is not in acute distress.    Appearance: She is well-developed. She is not diaphoretic.  HENT:     Head: Normocephalic and atraumatic.  Eyes:     General: No scleral icterus.    Conjunctiva/sclera: Conjunctivae normal.  Cardiovascular:     Rate and Rhythm: Normal rate and regular rhythm.     Heart sounds: Normal heart sounds. No murmur heard.  No friction rub. No gallop.   Pulmonary:     Effort: Pulmonary effort is normal. No respiratory distress.     Breath sounds: Normal breath sounds.  Abdominal:     General: Bowel sounds are normal. There is no distension.     Palpations: Abdomen is soft. There is no mass.     Tenderness: There is abdominal tenderness. There is no guarding.  Genitourinary:    Comments: Digital Rectal Exam reveals sphincter with good tone. No external hemorrhoids. No masses or fissures. Examining finger reveals maroon-colored stool and bright red blood. Musculoskeletal:     Cervical  back: Normal range of motion.  Skin:    General: Skin is warm and dry.  Neurological:     Mental Status: She is alert and oriented to person, place, and time.  Psychiatric:        Behavior: Behavior normal.     ED Results / Procedures / Treatments   Labs (all labs ordered are listed, but only abnormal  results are displayed) Labs Reviewed  COMPREHENSIVE METABOLIC PANEL - Abnormal; Notable for the following components:      Result Value   Glucose, Bld 105 (*)    Creatinine, Ser 1.90 (*)    Calcium 8.8 (*)    Albumin 3.3 (*)    GFR, Estimated 30 (*)    All other components within normal limits  CBC WITH DIFFERENTIAL/PLATELET - Abnormal; Notable for the following components:   RBC 3.06 (*)    Hemoglobin 7.9 (*)    HCT 26.3 (*)    MCH 25.8 (*)    RDW 19.8 (*)    Platelets 408 (*)    All other components within normal limits  POC OCCULT BLOOD, ED - Abnormal; Notable for the following components:   Fecal Occult Bld POSITIVE (*)    All other components within normal limits  RESP PANEL BY RT-PCR (FLU A&B, COVID) [id number]  PROTIME-INR  LACTIC ACID, PLASMA  HEMOGLOBIN AND HEMATOCRIT, BLOOD  HEMOGLOBIN AND HEMATOCRIT, BLOOD  HEMOGLOBIN AND HEMATOCRIT, BLOOD  LIPASE, BLOOD  AMYLASE  I-STAT CHEM 8, ED  TYPE AND SCREEN    EKG None  Radiology No results found.  Procedures Procedures (including critical care time)  Medications Ordered in ED Medications  amLODipine (NORVASC) tablet 10 mg (has no administration in time range)  atorvastatin (LIPITOR) tablet 80 mg (has no administration in time range)  ezetimibe (ZETIA) tablet 10 mg (has no administration in time range)  isosorbide mononitrate (IMDUR) 24 hr tablet 60 mg (has no administration in time range)  ALPRAZolam (XANAX) tablet 0.5 mg (has no administration in time range)  venlafaxine XR (EFFEXOR-XR) 24 hr capsule 300 mg (has no administration in time range)  azelastine (ASTELIN) 0.1 % nasal spray 1 spray (has no administration in time range)  mometasone-formoterol (DULERA) 100-5 MCG/ACT inhaler 2 puff (has no administration in time range)  fentaNYL (SUBLIMAZE) injection 12.5 mcg (has no administration in time range)  ondansetron (ZOFRAN) tablet 4 mg (has no administration in time range)    Or  ondansetron (ZOFRAN) injection  4 mg (has no administration in time range)  pantoprazole (PROTONIX) injection 40 mg (has no administration in time range)  pantoprazole (PROTONIX) injection 40 mg (40 mg Intravenous Given 08/23/20 1111)  morphine 4 MG/ML injection 4 mg (4 mg Intravenous Given 08/23/20 1112)  ondansetron (ZOFRAN) injection 4 mg (4 mg Intravenous Given 08/23/20 1112)  HYDROmorphone (DILAUDID) injection 1 mg (1 mg Intravenous Given 08/23/20 1407)    ED Course  I have reviewed the triage vital signs and the nursing notes.  Pertinent labs & imaging results that were available during my care of the patient were reviewed by me and considered in my medical decision making (see chart for details).    MDM Rules/Calculators/A&P                         [id number] VS:  Vitals:   08/23/20 1148 08/23/20 1300 08/23/20 1411 08/23/20 1500  BP: (!) 143/85 (!) 152/81 (!) 160/70 139/69  Pulse: 74 63 65 61  Resp: (!) 22  17 18 16   Temp:    97.8 F (36.6 C)  TempSrc:      SpO2: 94% 91% 93% 93%  Weight:      Height:        OE:[id number] is gathered by patient and emr. Previous records obtained and reviewed. DDX:[name] complaint of hematochezia involves an extensive number of diagnostic and treatment options, and is a complaint that carries with it a high risk of complications, morbidity, and potential mortality. Given the large differential diagnosis, medical decision making is of high complexity.  DDx of hematochezia Ischemic colitis, perforated diverticulum, bleeding AVM, rectal laceration, internal hemorrhoids, brisk upper GI bleed Labs: I ordered reviewed and interpreted labs which include Respiratory panel which is negative for Covid and flu.  CMP which shows chronic renal insufficiency, CBC which shows hemoglobin of 7.9 down about 1 g from yesterday, no other significant abnormalities, lactic acid PT/INR within normal limits, and occult stool positive. Imaging:  EKG: Consults: [facility] gastro[facility]ology who  will see the patient. MDM: Patient here with multiple episodes of bright red blood per rectum.  She had a large bloody bowel movement here in the emergency department.  Hemoglobin has dropped 1 g from yesterday.  Patient will be admitted by the hospitalist service. Patient disposition:The patient appears reasonably stabilized for admission considering the current resources, flow, and capabilities available in the ED at this time, and I doubt any other [facility] requiring further screening and/or treatment in the ED prior to admission.         Final Clinical Impression(s) / ED Diagnoses Final diagnoses:  Gastrointestinal hemorrhage, unspecified gastrointestinal hemorrhage type    Rx / DC Orders ED Discharge Orders    None       Margarita Mail, PA-C 08/23/20 [place], [place], DO 08/24/20 [phone]

## 2020-08-23 NOTE — ED Triage Notes (Signed)
EMS transport from home, Hx of GI bleed, pt complaint of rectal bleeding since last night 6 times of bright red blood, ABD pain.

## 2020-08-23 NOTE — Progress Notes (Signed)
Patient arrived to [facility]. Patient oriented to unit. VSS. AOx4.  Pain is 4/10 at this time.  Will continue to monitor.

## 2020-08-23 NOTE — Telephone Encounter (Signed)
Agree- disregard my message about her labs then. Thanks- JLL

## 2020-08-23 NOTE — Telephone Encounter (Signed)
The pt has a history of GI bleed and states that starting last night she developed BRBPR.  She has had 6 episodes since last night of blood independent of stool that she says fills the toilet. I have advised that she go to the ED for evaluation.  Pt agrees.

## 2020-08-23 NOTE — H&P (Signed)
History and Physical    [name] MPN:[id number] DOB: 10/13/1960 DOA: 08/23/2020  PCP: Bernerd Limbo, MD  Patient coming from: Home  Chief Complaint: rectal bleeding  HPI: [name] is a 59 y.o. female with medical history significant of CHF, COPD, partial colectomy. Presenting with abdominal pain and rectal bleeding. She reports that she had general crampy pain for the last several days. She happened to have a GI follow up yesterday, so she spoke with them about it. It was believed that he pain was d/t to constipation and she was given some medication. She woke this morning with significant RUQ/RLQ ab pain. She felt like she needed to have a BM. When she did, she noticed bright red blood on the toilet tissue. She had an additional 5 BMs throughout the morning all with BRBPR. She became concerned and came to the ED. No alleviating or aggravating factors noted.   ED Course: EDP spoke with [facility]. They recommended bringing the patient in for observation. [facility] was called for admisison.    Review of Systems:  Denies CP, palpitations, dyspnea. Review of systems is otherwise negative for all not mentioned in HPI.   PMHx Past Medical History:  Diagnosis Date  . Anemia 2008  . Anxiety   . Aortic stenosis   . Arthritis    "all my joints ache" (09/07/2014)  . Asthma   . Bipolar 1 disorder ([place])   . Bradycardia   . Bruit   . Carpal tunnel syndrome   . Cervical cancer ([place]) 1985  . CHF (congestive heart failure) ([place])   . Chronic kidney disease (CKD), stage V ([place])   . COPD (chronic obstructive pulmonary disease) ([place]) 2000  . Coronary artery disease   . Daily headache   . Depression 2000  . Diverticulitis 2008  . GERD (gastroesophageal reflux disease)   . Glaucoma   . Heart murmur   . History of colon polyps 2009  . HLD (hyperlipidemia) 2013  . Hypertension 2013  . Hypovitaminosis D   . IBS (irritable bowel syndrome) 2008  . Myocardial infarction ([place])     2015  . PAD (peripheral artery disease) ([place])   . Pancreatitis 10/2011  . Pneumonia 07/2014  . RLS (restless legs syndrome)   . Schizophrenia ([place])   . Shortness of breath   . Skin cancer   . Small bowel obstruction ([place]) 2008    PSHx Past Surgical History:  Procedure Laterality Date  . ABDOMINAL ANGIOGRAM N/A 09/07/2014   Procedure: ABDOMINAL ANGIOGRAM;  Surgeon: Laverda Page, MD;  Location: [facility] CATH LAB;  Service: Cardiovascular;  Laterality: N/A;  . APPENDECTOMY  1996  . BIOPSY  08/06/2020   Procedure: BIOPSY;  Surgeon: Jackquline Denmark, MD;  Location: [facility] ENDOSCOPY;  Service: Endoscopy;;  . COLON SURGERY  2008   6 inches of colon removed due to obstruction  . COLONOSCOPY WITH PROPOFOL N/A 10/25/2016   Procedure: COLONOSCOPY WITH PROPOFOL;  Surgeon: Milus Banister, MD;  Location: [facility] ENDOSCOPY;  Service: Endoscopy;  Laterality: N/A;  . COLONOSCOPY WITH PROPOFOL N/A 08/07/2020   Procedure: COLONOSCOPY WITH PROPOFOL;  Surgeon: Jackquline Denmark, MD;  Location: [facility] ENDOSCOPY;  Service: Endoscopy;  Laterality: N/A;  . CORONARY ANGIOPLASTY WITH STENT PLACEMENT  09/07/2014   "2"  . ESOPHAGOGASTRODUODENOSCOPY (EGD) WITH PROPOFOL N/A 08/06/2020   Procedure: ESOPHAGOGASTRODUODENOSCOPY (EGD) WITH PROPOFOL;  Surgeon: Jackquline Denmark, MD;  Location: [facility] ENDOSCOPY;  Service: Endoscopy;  Laterality: N/A;  . HOT HEMOSTASIS N/A 08/07/2020   Procedure: HOT  HEMOSTASIS (ARGON PLASMA COAGULATION/BICAP);  Surgeon: Jackquline Denmark, MD;  Location: [facility];  Service: Endoscopy;  Laterality: N/A;  . LEFT HEART CATH AND CORONARY ANGIOGRAPHY N/A 03/25/2018   Procedure: LEFT HEART CATH AND CORONARY ANGIOGRAPHY;  Surgeon: Nigel Mormon, MD;  Location: [place] CV LAB;  Service: Cardiovascular;  Laterality: N/A;  . LEFT HEART CATHETERIZATION WITH CORONARY ANGIOGRAM N/A 09/07/2014   Procedure: LEFT HEART CATHETERIZATION WITH CORONARY ANGIOGRAM;  Surgeon: Laverda Page, MD;  Location: [facility] CATH LAB;  Service:  Cardiovascular;  Laterality: N/A;  . LOWER EXTREMITY ANGIOGRAPHY  10/29/2017   Procedure: Lower Extremity Angiography;  Surgeon: Adrian Prows, MD;  Location: [place] CV LAB;  Service: Cardiovascular;;  . PERIPHERAL VASCULAR CATHETERIZATION N/A 11/01/2015   Procedure: Renal Angiography;  Surgeon: Adrian Prows, MD;  Location: [place] CV LAB;  Service: Cardiovascular;  Laterality: N/A;  . PERIPHERAL VASCULAR CATHETERIZATION N/A 04/10/2016   Procedure: Renal Angiography;  Surgeon: Adrian Prows, MD;  Location: [place] CV LAB;  Service: Cardiovascular;  Laterality: N/A;  . PERIPHERAL VASCULAR CATHETERIZATION  04/10/2016   Procedure: Peripheral Vascular Intervention;  Surgeon: Adrian Prows, MD;  Location: [place] CV LAB;  Service: Cardiovascular;;  . PERIPHERAL VASCULAR INTERVENTION  10/29/2017   Procedure: PERIPHERAL VASCULAR INTERVENTION;  Surgeon: Adrian Prows, MD;  Location: [place] CV LAB;  Service: Cardiovascular;;  . POLYPECTOMY  08/07/2020   Procedure: POLYPECTOMY;  Surgeon: Jackquline Denmark, MD;  Location: [facility] ENDOS[facility];  Service: Endoscopy;;  . RENAL ANGIOGRAPHY N/A 10/29/2017   Procedure: RENAL ANGIOGRAPHY;  Surgeon: Adrian Prows, MD;  Location: [place] CV LAB;  Service: Cardiovascular;  Laterality: N/A;  . RENAL ANGIOGRAPHY N/A 05/10/2020   Procedure: RENAL ANGIOGRAPHY;  Surgeon: Nigel Mormon, MD;  Location: [place] CV LAB;  Service: Cardiovascular;  Laterality: N/A;  . RIGHT OOPHORECTOMY Right 1996  . TOTAL ABDOMINAL HYSTERECTOMY  1997    SocHx  reports that she has been smoking cigarettes. She has a 10.00 pack-year smoking history. She has never used smokeless tobacco. She reports current drug use. Frequency: 3.00 times per week. Drug: Marijuana. She reports that she does not drink alcohol.  Allergies  Allergen Reactions  . Doxycycline Anaphylaxis and Hives  . Hydralazine Rash  . Hydrocodone-Acetaminophen Nausea And Vomiting  . Iohexol Itching    Pt has itching nose  after iv contrast injection    FamHx Family History  Problem Relation Age of Onset  . Other Mother        many bowel obstructions  . Heart disease Mother   . Colon polyps Mother   . Kidney cancer Father   . Bone cancer Father   . Diabetes Father   . Heart disease Father   . Diabetes Daughter   . Colon cancer Paternal Grandfather   . Heart disease Brother   . Esophageal cancer Neg Hx     Prior to Admission medications   Medication Sig Start Date End Date Taking? Authorizing Provider  acetaminophen (TYLENOL) 500 MG tablet Take 1,000 mg by mouth every 8 (eight) hours as needed for mild pain or headache.   Yes [provider]  ALPRAZolam Duanne Moron) 1 MG tablet Take 0.5 tablets by mouth daily as needed for anxiety.  04/15/17  Yes [provider]  amLODipine (NORVASC) 10 MG tablet Take 1 tablet (10 mg total) by mouth daily. 08/08/20 09/07/20 Yes Donne Hazel, MD  atenolol (TENORMIN) 25 MG tablet TAKE 1 TABLET BY MOUTH ONCE A DAY Patient taking differently: Take  25 mg by mouth daily.  08/04/20  Yes Cantwell, Celeste C, PA-C  atorvastatin (LIPITOR) 80 MG tablet Take 80 mg by mouth daily.    Yes [provider]  azelastine (ASTELIN) 0.1 % nasal spray Place 1 spray into both nostrils daily as needed for rhinitis or allergies.  06/24/19 08/23/20 Yes [provider]  clopidogrel (PLAVIX) 75 MG tablet Take 1 tablet (75 mg total) by mouth daily. 08/08/20 08/08/21 Yes Donne Hazel, MD  ezetimibe (ZETIA) 10 MG tablet Take 1 tablet (10 mg total) by mouth daily. 03/24/20  Yes Adrian Prows, MD  isosorbide mononitrate (IMDUR) 60 MG 24 hr tablet TAKE 1 TABLET BY MOUTH DAILY Patient taking differently: Take 60 mg by mouth daily.  07/18/20  Yes Cantwell, Celeste C, PA-C  mometasone-formoterol (DULERA) 100-5 MCG/ACT AERO Take 2 puffs first thing in am and then another 2 puffs about 12 hours later. Patient taking differently: Inhale 2 puffs into the lungs in the morning and at  bedtime.  04/26/17  Yes Tanda Rockers, MD  nitroGLYCERIN (NITROSTAT) 0.4 MG SL tablet Place 1 tablet (0.4 mg total) under the tongue every 5 (five) minutes as needed for chest pain. 10/15/19  Yes Miquel Dunn, NP  pantoprazole (PROTONIX) 40 MG tablet TAKE 1 TABLET BY MOUTH TWICE (2) DAILY Patient taking differently: Take 80 mg by mouth daily.  06/08/20  Yes Adrian Prows, MD  polyethylene glycol (MIRALAX) 17 g packet Take 17 g by mouth daily. 08/07/20  Yes Donne Hazel, MD  promethazine (PHENERGAN) 25 MG tablet Take 1 tablet by mouth daily as needed for nausea or vomiting.  08/16/20  Yes [provider]  traMADol (ULTRAM) 50 MG tablet Take 1 tablet (50 mg total) by mouth every 6 (six) hours as needed. Patient taking differently: Take 50 mg by mouth every 6 (six) hours as needed for moderate pain.  08/07/20  Yes Donne Hazel, MD  venlafaxine XR (EFFEXOR XR) 150 MG 24 hr capsule Take 1 capsule (150 mg total) by mouth daily with breakfast. Patient taking differently: Take 300 mg by mouth daily with breakfast.  11/09/15  Yes Patel-Mills, Orvil Feil, PA-C  albuterol (PROVENTIL HFA;VENTOLIN HFA) 108 (90 BASE) MCG/ACT inhaler Inhale 2 puffs into the lungs 2 (two) times daily as needed for wheezing or shortness of breath (asthma).     [provider]  aspirin EC 81 MG tablet Take 1 tablet (81 mg total) by mouth daily. 05/10/20 05/10/21  Patwardhan, Reynold Bowen, MD  linaclotide (LINZESS) 72 MCG capsule Take 1 capsule (72 mcg total) by mouth daily before breakfast. 08/22/20   Levin Erp, PA  megestrol (MEGACE) 20 MG tablet Take 1 tablet by mouth daily. 08/16/20 08/16/21  [provider]    Physical Exam: Vitals:   08/23/20 1042 08/23/20 1145 08/23/20 1148 08/23/20 1300  BP: (!) 146/81 (!) 145/75 (!) 143/85 (!) 152/81  Pulse: 66 67 74 63  Resp: 16 19 (!) 22 17  Temp: 97.9 F (36.6 C)     TempSrc: Oral     SpO2: 98% 90% 94% 91%  Weight:      Height:         General: 59 y.o. female resting in bed in NAD Eyes: PERRL, normal sclera ENMT: Nares patent w/o discharge, orophaynx clear, dentition normal, ears w/o discharge/lesions/ulcers Neck: Supple, trachea midline Cardiovascular: RRR, +S1, S2, no m/g/r, equal pulses throughout Respiratory: CTABL, no w/r/r, normal WOB GI: BS+, ND, RUQ/RLQ TTP, no masses noted, no  organomegaly noted MSK: No e/c/c Skin: No rashes, bruises, ulcerations noted Neuro: A&O x 3, no focal deficits Psyc: Appropriate interaction and affect, calm/cooperative  Labs on Admission: I have personally reviewed following labs and imaging studies  CBC: Recent Labs  Lab 08/22/20 1358 08/23/20 1048  WBC 7.4 6.8  NEUTROABS 4.5 4.8  HGB 8.8 Repeated and verified X2.* 7.9*  HCT 27.2* 26.3*  MCV 80.6 85.9  PLT 560.0* 476*   Basic Metabolic Panel: Recent Labs  Lab 08/23/20 1048  NA 137  K 4.5  CL 105  CO2 24  GLUCOSE 105*  BUN 20  CREATININE 1.90*  CALCIUM 8.8*   GFR: Estimated Creatinine Clearance: 23.2 mL/min (A) (by C-G formula based on SCr of 1.9 mg/dL (H)). Liver Function Tests: Recent Labs  Lab 08/23/20 1048  AST 21  ALT 12  ALKPHOS 96  BILITOT 0.5  PROT 6.7  ALBUMIN 3.3*   No results for input(s): LIPASE, AMYLASE in the last 168 hours. No results for input(s): AMMONIA in the last 168 hours. Coagulation Profile: Recent Labs  Lab 08/23/20 1048  INR 0.9   Cardiac Enzymes: No results for input(s): CKTOTAL, CKMB, CKMBINDEX, TROPONINI in the last 168 hours. BNP (last 3 results) No results for input(s): PROBNP in the last 8760 hours. HbA1C: No results for input(s): HGBA1C in the last 72 hours. CBG: No results for input(s): GLUCAP in the last 168 hours. Lipid Profile: No results for input(s): CHOL, HDL, LDLCALC, TRIG, CHOLHDL, LDLDIRECT in the last 72 hours. Thyroid Function Tests: No results for input(s): TSH, T4TOTAL, FREET4, T3FREE, THYROIDAB in the last 72 hours. Anemia Panel: Recent  Labs    08/22/20 1358  FERRITIN 7.9*  IRON 54   Urine analysis:    Component Value Date/Time   COLORURINE YELLOW 08/05/2020 0640   APPEARANCEUR CLEAR 08/05/2020 0640   APPEARANCEUR Hazy 06/07/2012 1350   LABSPEC 1.014 08/05/2020 0640   LABSPEC 1.016 06/07/2012 1350   PHURINE 5.0 08/05/2020 0640   GLUCOSEU NEGATIVE 08/05/2020 0640   GLUCOSEU Negative 06/07/2012 1350   HGBUR NEGATIVE 08/05/2020 0640   BILIRUBINUR NEGATIVE 08/05/2020 0640   BILIRUBINUR Negative 06/07/2012 1350   KETONESUR NEGATIVE 08/05/2020 0640   PROTEINUR >=300 (A) 08/05/2020 0640   UROBILINOGEN 0.2 06/28/2014 1518   NITRITE NEGATIVE 08/05/2020 0640   LEUKOCYTESUR NEGATIVE 08/05/2020 0640   LEUKOCYTESUR Negative 06/07/2012 1350    Radiological Exams on Admission: No results found.  Assessment/Plan Abdominal Pain GIB Normocytic anemia, multifactorial     - admit to obs, med-surg     - Protonix IV BID, fluids, NPO     - [facility] onboard, appreciate assistance     - CT ab/pelvis ordered     - q6h H&H; transfuse as necessary  Tobacco abuse      - counseled against further use  CKD3b     - Scr at baseline, watch nephrotoxins  HLD     - continue lipitor, zetia  Anxiety     - continue xanax, effexor  COPD     - continue dulera  HTN     - continue norvasc; hold atenolol d/t bradycardia  Chronic HFpEF     - continue imdur, holding atenolol d/t bradycardia  PAD     - holding ASA/plavix d/t bleed  DVT prophylaxis: SCDs  Code Status: FULL  Family Communication: None at bedside.   Consults called: EDP called [facility]  Status is: Observation  The patient remains OBS appropriate and will d/c before 2 midnights.  Dispo: The patient is from: Home              Anticipated d/c is to: Home              Anticipated d/c date is: 1 day              Patient currently is not medically stable to d/c.  Jonnie Finner DO [facility]  If 7PM-7AM, please contact  night-coverage [email or web address]  08/23/2020, 1:34 PM

## 2020-08-23 NOTE — Consult Note (Signed)
Referring Provider: Dr. Lennice Sites  Primary Care Physician:  Bernerd Limbo, MD Primary Gastroenterologist:  Dr. Owens Loffler   Reason for Consultation: Hematochezia, anemia  HPI: [name] is a 59 y.o. female with a past medical history anxiety, depression, bipolar, asthma, COPD, hypertension, coronary artery disease MI in 2015 on Plavix, CHF, aortic stenosis, peripheral arterial disease s/p placement to the abdominal aorta, CKD, s/p bilateral renal artery angioplasty, GERD, perforated diverticulitis status post colectomy in 2008, colon polyps and chronic constipation.   She was admitted to the hospital 08/05/2020 secondary to a UG/lower GI bleed.  Admission hemoglobin 5 (baseline Hg 11). Transfused 2 units of PRBCs. Post transfusion Hg 8.  She underwent an EGD by Dr. Lyndel Safe on 08/06/2020.  The EGD showed food residue in the stomach, no evidence of upper GI bleeding.  She underwent a colonoscopy 08/07/2020 which showed a single nonbleeding AVM proximal ascending colon treated with APC, a 8 mm hyperplastic polyp was removed from the distal sigmoid colon, minimal medial sigmoid diverticulosis, patent end-to-end colocolonic anastomosis and nonbleeding internal hemorrhoids. No evidence of active bleeding. The prep was inadequate. Plavix was restarted on 08/08/2020.  She was seen in our outpatient clinic yesterday by Ellouise Newer, PA-C.  At that time, the patient reported having generalized abdominal pain, more to the right upper quadrant described as cramp-like.  She continued to have constipation despite using MiraLAX daily.  She also had breakthrough reflux symptoms for which she takes Pepcid with relief.  She remained on Pantoprazole 40 mg twice daily.  She reported feeling weak and dizzy during her appointment yesterday.  A CBC was done which showed a hemoglobin level of 8.8 (Hg 8.5 at time of discharge 11/7).  A repeat colonoscopy in 2 months was planned. However, she continued to  have RUQ and LUQ pain last night which awakened her from sleep. She went to the bathroom and passed a large amount of bright red blood with blood clots x5 episodes.  This morning she awakened and passed a formed brown bowel movement with a moderate amount of red blood in the toilet bowl. She presented to [facility] emergency room due to having 6 episodes of bright red blood per the rectum last night.  Labs in the ED showed a hemoglobin level of 7.9.  Hematocrit 26.3.  Platelet 408.  BUN 20.  Creatinine 1.90.  Normal LFTs.  INR 0.9.  Rectal exam completed by the ED physician assistant identified maroon and bright red blood on the exam globe.  No melena.  A GI consult was requested for further evaluation.   Currently, she continues to feel slightly dizzy which she stated has been persistent since she was discharged from the hospital.  She continues to have right upper and left upper abdominal pain which she rates a 6 on a scale 1-10.  No nausea or vomiting.  She passed a normal brown formed bowel movement with a small amount of red blood while in the ED.  Her last dose of Plavix was taken at 11:30 AM on 08/22/2020.  She takes ASA 81 mg daily.  No other NSAID use.  Her appetite has been fair, she eats 2 meals daily.  She denies any weight loss.  No fever.  No family at the bedside.  CTAP during her prior hospital admission 08/04/2020: No acute findings in the abdomen or pelvis. Diffuse vascular atherosclerosis. Trace left pleural effusion with left base atelectasis.   EGD 08/06/2020: The examined esophagus was normal  with well defined Z-line at 38 cm. Food (residue) was found in the gastric body without outlet obstruction. Gastric mucosa was normal. The examined duodenum was normal. Biopsies for histology were taken with a cold forceps to r/o celiac disease. -Duodenal biopsies were negative for celiac disease.  Colonoscopy 08/07/2020: - A single non-bleeding colonic angiodysplastic lesion.  Treated with argon plasma coagulation (APC). - One 8 mm hyperplastic polyp in the distal sigmoid colon, removed with a cold snare. Resected and retrieved. - Minimal neo-sigmoid diverticulosis. - Patent end-to-end colo-colonic anastomosis, characterized by healthy appearing mucosa. - Non-bleeding internal hemorrhoids.  A repeat  colonoscopy with a 2-day bowel prep in 6 months was recommended.   Colonoscopy for evaluation of a positive Cologuard study January 2018: -Exam complete with an excellent bowel prep. -2 sessile polyps removed from the descending colon and transverse colon measuring 3 to 4 mm in size -A 10 mm polyp was found in the distal sigmoid colon, removed in piecemeal fashion. -Path compatible with tubular adenomas plus hyperplastic polyp  August 2018 flexible sigmoidoscopy for follow-up on 10 mm adenoma removed in January 2018 -Small rectal hyperplastic polyp   August 2018 EGD for dysphagia -Normal   Past Medical History:  Diagnosis Date  . Anemia 2008  . Anxiety   . Aortic stenosis   . Arthritis    "all my joints ache" (09/07/2014)  . Asthma   . Bipolar 1 disorder ([place])   . Bradycardia   . Bruit   . Carpal tunnel syndrome   . Cervical cancer ([place]) 1985  . CHF (congestive heart failure) ([place])   . Chronic kidney disease (CKD), stage V ([place])   . COPD (chronic obstructive pulmonary disease) ([place]) 2000  . Coronary artery disease   . Daily headache   . Depression 2000  . Diverticulitis 2008  . GERD (gastroesophageal reflux disease)   . Glaucoma   . Heart murmur   . History of colon polyps 2009  . HLD (hyperlipidemia) 2013  . Hypertension 2013  . Hypovitaminosis D   . IBS (irritable bowel syndrome) 2008  . Myocardial infarction ([place])    2015  . PAD (peripheral artery disease) ([place])   . Pancreatitis 10/2011  . Pneumonia 07/2014  . RLS (restless legs syndrome)   . Schizophrenia ([place])   . Shortness of breath   . Skin cancer   . Small bowel obstruction  ([place]) 2008    Past Surgical History:  Procedure Laterality Date  . ABDOMINAL ANGIOGRAM N/A 09/07/2014   Procedure: ABDOMINAL ANGIOGRAM;  Surgeon: Laverda Page, MD;  Location: [facility] CATH LAB;  Service: Cardiovascular;  Laterality: N/A;  . APPENDECTOMY  1996  . BIOPSY  08/06/2020   Procedure: BIOPSY;  Surgeon: Jackquline Denmark, MD;  Location: [facility] ENDOSCOPY;  Service: Endoscopy;;  . COLON SURGERY  2008   6 inches of colon removed due to obstruction  . COLONOSCOPY WITH PROPOFOL N/A 10/25/2016   Procedure: COLONOSCOPY WITH PROPOFOL;  Surgeon: Milus Banister, MD;  Location: [facility] ENDOSCOPY;  Service: Endoscopy;  Laterality: N/A;  . COLONOSCOPY WITH PROPOFOL N/A 08/07/2020   Procedure: COLONOSCOPY WITH PROPOFOL;  Surgeon: Jackquline Denmark, MD;  Location: [facility] ENDOSCOPY;  Service: Endoscopy;  Laterality: N/A;  . CORONARY ANGIOPLASTY WITH STENT PLACEMENT  09/07/2014   "2"  . ESOPHAGOGASTRODUODENOSCOPY (EGD) WITH PROPOFOL N/A 08/06/2020   Procedure: ESOPHAGOGASTRODUODENOSCOPY (EGD) WITH PROPOFOL;  Surgeon: Jackquline Denmark, MD;  Location: [facility] ENDOSCOPY;  Service: Endoscopy;  Laterality: N/A;  . HOT HEMOSTASIS N/A 08/07/2020   Procedure:  HOT HEMOSTASIS (ARGON PLASMA COAGULATION/BICAP);  Surgeon: Jackquline Denmark, MD;  Location: [facility];  Service: Endoscopy;  Laterality: N/A;  . LEFT HEART CATH AND CORONARY ANGIOGRAPHY N/A 03/25/2018   Procedure: LEFT HEART CATH AND CORONARY ANGIOGRAPHY;  Surgeon: Nigel Mormon, MD;  Location: [place] CV LAB;  Service: Cardiovascular;  Laterality: N/A;  . LEFT HEART CATHETERIZATION WITH CORONARY ANGIOGRAM N/A 09/07/2014   Procedure: LEFT HEART CATHETERIZATION WITH CORONARY ANGIOGRAM;  Surgeon: Laverda Page, MD;  Location[facility] C[facility] LAB;  Service: Cardiovascular;  Laterality: N/A;  . LOWER EXTREMITY ANGIOGRAPHY  10/29/2017   Procedure: Lower Extremity Angiography;  Surgeon: Adrian Prows, MD;  Location: [place] CV LAB;  Service: Cardiovascular;;  . PERIPHERAL VASCULAR  CATHETERIZATION N/A 11/01/2015   Procedure: Renal Angiography;  Surgeon: Adrian Prows, MD;  Location: [place] CV LAB;  Service: Cardiovascular;  Laterality: N/A;  . PERIPHERAL VASCULAR CATHETERIZATION N/A 04/10/2016   Procedure: Renal Angiography;  Surgeon: Adrian Prows, MD;  Location: [place] CV LAB;  Service: Cardiovascular;  Laterality: N/A;  . PERIPHERAL VASCULAR CATHETERIZATION  04/10/2016   Procedure: Peripheral Vascular Intervention;  Surgeon: Adrian Prows, MD;  Location: [place] CV LAB;  Service: Cardiovascular;;  . PERIPHERAL VASCULAR INTERVENTION  10/29/2017   Procedure: PERIPHERAL VASCULAR INTERVENTION;  Surgeon: Adrian Prows, MD;  Location: [place] CV LAB;  Service: Cardiovascular;;  . POLYPECTOMY  08/07/2020   Procedure: POLYPECTOMY;  Surgeon: Jackquline Denmark, MD;  Location: [facility] ENDOSCOPY;  Service: Endoscopy;;  . RENAL ANGIOGRAPHY N/A 10/29/2017   Procedure: RENAL ANGIOGRAPHY;  Surgeon: Adrian Prows, MD;  Location: [place] CV LAB;  Service: Cardiovascular;  Laterality: N/A;  . RENAL ANGIOGRAPHY N/A 05/10/2020   Procedure: RENAL ANGIOGRAPHY;  Surgeon: Nigel Mormon, MD;  Location: [place] CV LAB;  Service: Cardiovascular;  Laterality: N/A;  . RIGHT OOPHORECTOMY Right 1996  . TOTAL ABDOMINAL HYSTERECTOMY  1997    Prior to Admission medications   Medication Sig Start Date End Date Taking? Authorizing Provider  acetaminophen (TYLENOL) 500 MG tablet Take 1,000 mg by mouth every 8 (eight) hours as needed for mild pain or headache.    [provider]  albuterol (PROVENTIL HFA;VENTOLIN HFA) 108 (90 BASE) MCG/ACT inhaler Inhale 2 puffs into the lungs 2 (two) times daily as needed for wheezing or shortness of breath (asthma).     [provider]  ALPRAZolam Duanne Moron) 1 MG tablet Take 0.5 tablets by mouth 2 (two) times daily as needed for anxiety.  04/15/17   [provider]  amLODipine (NORVASC) 10 MG tablet Take 1 tablet (10 mg total) by mouth daily.  08/08/20 09/07/20  Donne Hazel, MD  aspirin EC 81 MG tablet Take 1 tablet (81 mg total) by mouth daily. 05/10/20 05/10/21  Patwardhan, Reynold Bowen, MD  atenolol (TENORMIN) 25 MG tablet TAKE 1 TABLET BY MOUTH ONCE A DAY 08/04/20   Cantwell, Celeste C, PA-C  atorvastatin (LIPITOR) 80 MG tablet Take 80 mg by mouth daily.     [provider]  azelastine (ASTELIN) 0.1 % nasal spray Place 1 spray into both nostrils daily as needed for rhinitis or allergies.  06/24/19 08/22/20  [provider]  clopidogrel (PLAVIX) 75 MG tablet Take 1 tablet (75 mg total) by mouth daily. 08/08/20 08/08/21  Donne Hazel, MD  ezetimibe (ZETIA) 10 MG tablet Take 1 tablet (10 mg total) by mouth daily. 03/24/20   Adrian Prows, MD  isosorbide mononitrate (IMDUR) 60 MG 24 hr tablet TAKE 1 TABLET BY  MOUTH DAILY 07/18/20   Cantwell, Celeste C, PA-C  linaclotide (LINZESS) 72 MCG capsule Take 1 capsule (72 mcg total) by mouth daily before breakfast. 08/22/20   Levin Erp, PA  megestrol (MEGACE) 20 MG tablet Take 1 tablet by mouth daily. 08/16/20 08/16/21  [provider]  mometasone-formoterol (DULERA) 100-5 MCG/ACT AERO Take 2 puffs first thing in am and then another 2 puffs about 12 hours later. Patient taking differently: Inhale 2 puffs into the lungs daily.  04/26/17   Tanda Rockers, MD  nitroGLYCERIN (NITROSTAT) 0.4 MG SL tablet Place 1 tablet (0.4 mg total) under the tongue every 5 (five) minutes as needed for chest pain. 10/15/19   Miquel Dunn, NP  pantoprazole (PROTONIX) 40 MG tablet TAKE 1 TABLET BY MOUTH TWICE (2) DAILY Patient taking differently: Take 40 mg by mouth 2 (two) times daily.  06/08/20   Adrian Prows, MD  polyethylene glycol (MIRALAX) 17 g packet Take 17 g by mouth daily. 08/07/20   Donne Hazel, MD  promethazine (PHENERGAN) 25 MG tablet Take 1 tablet by mouth as needed. 08/16/20   [provider]  traMADol (ULTRAM) 50 MG tablet Take 1 tablet (50 mg total) by  mouth every 6 (six) hours as needed. 08/07/20   Donne Hazel, MD  traZODone (DESYREL) 100 MG tablet Take 100 mg by mouth at bedtime as needed for sleep.  01/04/20   [provider]  venlafaxine XR (EFFEXOR XR) 150 MG 24 hr capsule Take 1 capsule (150 mg total) by mouth daily with breakfast. Patient taking differently: Take 300 mg by mouth daily with breakfast.  11/09/15   Patel-Mills, Orvil Feil, PA-C    No current facility-administered medications for this encounter.   Current Outpatient Medications  Medication Sig Dispense Refill  . acetaminophen (TYLENOL) 500 MG tablet Take 1,000 mg by mouth every 8 (eight) hours as needed for mild pain or headache.    . albuterol (PROVENTIL HFA;VENTOLIN HFA) 108 (90 BASE) MCG/ACT inhaler Inhale 2 puffs into the lungs 2 (two) times daily as needed for wheezing or shortness of breath (asthma).     . ALPRAZolam (XANAX) 1 MG tablet Take 0.5 tablets by mouth 2 (two) times daily as needed for anxiety.     Marland Kitchen amLODipine (NORVASC) 10 MG tablet Take 1 tablet (10 mg total) by mouth daily. 30 tablet 0  . aspirin EC 81 MG tablet Take 1 tablet (81 mg total) by mouth daily. 30 tablet 2  . atenolol (TENORMIN) 25 MG tablet TAKE 1 TABLET BY MOUTH ONCE A DAY 90 tablet 1  . atorvastatin (LIPITOR) 80 MG tablet Take 80 mg by mouth daily.     Marland Kitchen azelastine (ASTELIN) 0.1 % nasal spray Place 1 spray into both nostrils daily as needed for rhinitis or allergies.     Marland Kitchen clopidogrel (PLAVIX) 75 MG tablet Take 1 tablet (75 mg total) by mouth daily. 30 tablet 2  . ezetimibe (ZETIA) 10 MG tablet Take 1 tablet (10 mg total) by mouth daily. 90 tablet 3  . isosorbide mononitrate (IMDUR) 60 MG 24 hr tablet TAKE 1 TABLET BY MOUTH DAILY 90 tablet 3  . linaclotide (LINZESS) 72 MCG capsule Take 1 capsule (72 mcg total) by mouth daily before breakfast. 30 capsule 2  . megestrol (MEGACE) 20 MG tablet Take 1 tablet by mouth daily.    . mometasone-formoterol (DULERA) 100-5 MCG/ACT AERO Take 2 puffs  first thing in am and then another 2 puffs about 12 hours later. (  Patient taking differently: Inhale 2 puffs into the lungs daily. ) 1 Inhaler 11  . nitroGLYCERIN (NITROSTAT) 0.4 MG SL tablet Place 1 tablet (0.4 mg total) under the tongue every 5 (five) minutes as needed for chest pain. 30 tablet 1  . pantoprazole (PROTONIX) 40 MG tablet TAKE 1 TABLET BY MOUTH TWICE (2) DAILY (Patient taking differently: Take 40 mg by mouth 2 (two) times daily. ) 180 tablet 2  . polyethylene glycol (MIRALAX) 17 g packet Take 17 g by mouth daily. 14 each 0  . promethazine (PHENERGAN) 25 MG tablet Take 1 tablet by mouth as needed.    . traMADol (ULTRAM) 50 MG tablet Take 1 tablet (50 mg total) by mouth every 6 (six) hours as needed. 8 tablet 0  . traZODone (DESYREL) 100 MG tablet Take 100 mg by mouth at bedtime as needed for sleep.     Marland Kitchen venlafaxine XR (EFFEXOR XR) 150 MG 24 hr capsule Take 1 capsule (150 mg total) by mouth daily with breakfast. (Patient taking differently: Take 300 mg by mouth daily with breakfast. ) 30 capsule 0    Allergies as of 08/23/2020 - Review Complete 08/23/2020  Allergen Reaction Noted  . Doxycycline Anaphylaxis and Hives   . Hydralazine Rash 08/16/2020  . Hydrocodone-acetaminophen Nausea And Vomiting 06/18/2012  . Iohexol Itching 10/16/2011    Family History  Problem Relation Age of Onset  . Other Mother        many bowel obstructions  . Heart disease Mother   . Colon polyps Mother   . Kidney cancer Father   . Bone cancer Father   . Diabetes Father   . Heart disease Father   . Diabetes Daughter   . Colon cancer Paternal Grandfather   . Heart disease Brother   . Esophageal cancer Neg Hx     Social History   Socioeconomic History  . Marital status: Divorced    Spouse name: Not on file  . Number of children: 1  . Years of education: Not on file  . Highest education level: Not on file  Occupational History  . Occupation: Scientist, research (life sciences): UNEMPLOYED    Tobacco Use  . Smoking status: Current Every Day Smoker    Packs/day: 0.25    Years: 40.00    Pack years: 10.00    Types: Cigarettes  . Smokeless tobacco: Never Used  Vaping Use  . Vaping Use: Former  Substance and Sexual Activity  . Alcohol use: No  . Drug use: Yes    Frequency: 3.0 times per week    Types: Marijuana    Comment: last use yesterday  . Sexual activity: Yes    Birth control/protection: Post-menopausal  Other Topics Concern  . Not on file  Social History Narrative  . Not on file   Social Determinants of Health   Financial Resource Strain:   . Difficulty of Paying Living Expenses: Not on file  Food Insecurity:   . Worried About Charity fundraiser in the Last Year: Not on file  . Ran Out of Food in the Last Year: Not on file  Transportation Needs:   . Lack of Transportation (Medical): Not on file  . Lack of Transportation (Non-Medical): Not on file  Physical Activity:   . Days of Exercise per Week: Not on file  . Minutes of Exercise per Session: Not on file  Stress:   . Feeling of Stress : Not on file  Social Connections:   .  Frequency of Communication with Friends and Family: Not on file  . Frequency of Social Gatherings with Friends and Family: Not on file  . Attends Religious Services: Not on file  . Active Member of Clubs or Organizations: Not on file  . Attends Archivist Meetings: Not on file  . Marital Status: Not on file  Intimate Partner Violence:   . Fear of Current or Ex-Partner: Not on file  . Emotionally Abused: Not on file  . Physically Abused: Not on file  . Sexually Abused: Not on file    Review of Systems: Gen: Denies fever, sweats or chills. No weights. CV: + palpitations. No chest pain.  Resp: + SOB. No hemoptysis or cough. Marland Kitchen  GI: See HPI. GU : Denies urinary burning, blood in urine, increased urinary frequency or incontinence. MS: Denies joint pain, muscles aches or weakness. Derm: Denies rash, itchiness, skin  lesions or unhealing ulcers. Psych: Denies depression, anxiety or memory loss. Heme: Denies easy bruising, bleeding. Neuro:  + dizziness.  Endo:  Denies any problems with DM, thyroid or adrenal function.  Physical Exam: Vital signs in last 24 hours: Temp:  [97.9 F (36.6 C)] 97.9 F (36.6 C) (11/23 1042) Pulse Rate:  [64-66] 66 (11/23 1042) Resp:  [16] 16 (11/23 1042) BP: (120-146)/(58-81) 146/81 (11/23 1042) SpO2:  [95 %-98 %] 98 % (11/23 1042) Weight:  [44.9 kg-45.1 kg] 44.9 kg (11/23 1038)   General:  Fatigued appearing 59 year old female in NAD. Head:  Normocephalic and atraumatic. Eyes:  No scleral icterus. Conjunctiva pink. Ears:  Normal auditory acuity. Nose:  No deformity, discharge or lesions. Mouth:  Dentition intact. No ulcers or lesions.  Neck:  Supple. No lymphadenopathy or thyromegaly.  Lungs: Breath sounds clear throughout. Heart: Regular rate and rhythm.  Systolic murmur. Abdomen: Soft, nondistended.  Mild tenderness to the right upper and left upper quadrants without rebound or guarding.  Positive bowel sounds to all 4 quadrants.  Positive bruit to the upper abdomen.  Midline abdominal scar intact.  No HSM.  No mass. Rectal: Deferred.  Rectal exam completed by the ED PA-C identified maroon and bright red blood on the exam glove. Musculoskeletal:  Symmetrical without gross deformities.  Pulses:  Normal pulses noted. Extremities:  Without clubbing or edema. Neurologic:  Alert and  oriented x4. No focal deficits.  Skin:  Intact without significant lesions or rashes. Psych:  Alert and cooperative. Normal mood and affect.  Intake/Output from previous day: No intake/output data recorded. Intake/Output this shift: No intake/output data recorded.  Lab Results: Recent Labs    08/22/20 1358 08/23/20 1048  WBC 7.4 6.8  HGB 8.8 Repeated and verified X2.* 7.9*  HCT 27.2* 26.3*  PLT 560.0* 408*   BMET No results for input(s): NA, K, CL, CO2, GLUCOSE, BUN,  CREATININE, CALCIUM in the last 72 hours. LFT No results for input(s): PROT, ALBUMIN, AST, ALT, ALKPHOS, BILITOT, BILIDIR, IBILI in the last 72 hours. PT/INR No results for input(s): LABPROT, INR in the last 72 hours. Hepatitis Panel No results for input(s): HEPBSAG, HCVAB, HEPAIGM, HEPBIGM in the last 72 hours.    Studies/Results: No results found.  IMPRESSION/PLAN:  1.  59 year old female with lower GI bleed. S/P colonoscopy 11/7 showed  A single non-bleeding AVM to the proximal ascending colon and minimal neo-sigmoid diverticulosis without evidence of active bleeding.  -NPO -IV fluids per the hospitalist  -Pain management per the hospitalist  -PPI IV bid  -Check H&H every 6 hours -Transfuse for  Hemoglobin less than 8 -Consider repeat abd/pelvic CT scan w/o contrast -Consider tagged red blood cell scan if significant active GI bleeding recurs  -Plavix washout prior to pursuing a repeat colonoscopy, further recommendations per Dr. Bryan Lemma   2.  Acute on chronic anemia. -See plan in # 1  3.  History of perforated diverticulitis status post colectomy in 2008  4.  History of adenomatous colon polyps per colonoscopy in 2018  5.  History of COPD  6. History of CAD. Normal cardiac catheterization 03/2018.   7. CKD. Cr. 1.90, stable   8. Past abdominal aortic stent, peripheral arterial disease on Plavix. Last dose of Plavix was at 11:30am on 11/22.        Patrecia Pour Kennedy-Smith  08/23/2020, 11:32 AM

## 2020-08-24 ENCOUNTER — Observation Stay (HOSPITAL_COMMUNITY): Payer: Medicare Other

## 2020-08-24 ENCOUNTER — Encounter (HOSPITAL_COMMUNITY)
Admission: EM | Disposition: A | Discharge status: Home / Self Care | Payer: Self-pay | Source: Home / Self Care | Attending: Family Medicine

## 2020-08-24 DIAGNOSIS — N1832 Chronic kidney disease, stage 3b: Secondary | ICD-10-CM | POA: Diagnosis present

## 2020-08-24 DIAGNOSIS — Z20822 Contact with and (suspected) exposure to covid-19: Secondary | ICD-10-CM | POA: Diagnosis present

## 2020-08-24 DIAGNOSIS — K31819 Angiodysplasia of stomach and duodenum without bleeding: Secondary | ICD-10-CM | POA: Diagnosis not present

## 2020-08-24 DIAGNOSIS — F411 Generalized anxiety disorder: Secondary | ICD-10-CM | POA: Diagnosis present

## 2020-08-24 DIAGNOSIS — K648 Other hemorrhoids: Secondary | ICD-10-CM | POA: Diagnosis present

## 2020-08-24 DIAGNOSIS — I739 Peripheral vascular disease, unspecified: Secondary | ICD-10-CM | POA: Diagnosis present

## 2020-08-24 DIAGNOSIS — K922 Gastrointestinal hemorrhage, unspecified: Secondary | ICD-10-CM | POA: Diagnosis present

## 2020-08-24 DIAGNOSIS — K921 Melena: Secondary | ICD-10-CM

## 2020-08-24 DIAGNOSIS — D62 Acute posthemorrhagic anemia: Secondary | ICD-10-CM | POA: Diagnosis present

## 2020-08-24 DIAGNOSIS — Z8051 Family history of malignant neoplasm of kidney: Secondary | ICD-10-CM | POA: Diagnosis not present

## 2020-08-24 DIAGNOSIS — I10 Essential (primary) hypertension: Secondary | ICD-10-CM | POA: Diagnosis not present

## 2020-08-24 DIAGNOSIS — Z7902 Long term (current) use of antithrombotics/antiplatelets: Secondary | ICD-10-CM

## 2020-08-24 DIAGNOSIS — K219 Gastro-esophageal reflux disease without esophagitis: Secondary | ICD-10-CM | POA: Diagnosis present

## 2020-08-24 DIAGNOSIS — Z9049 Acquired absence of other specified parts of digestive tract: Secondary | ICD-10-CM | POA: Diagnosis not present

## 2020-08-24 DIAGNOSIS — K635 Polyp of colon: Secondary | ICD-10-CM | POA: Diagnosis present

## 2020-08-24 DIAGNOSIS — F17210 Nicotine dependence, cigarettes, uncomplicated: Secondary | ICD-10-CM | POA: Diagnosis present

## 2020-08-24 DIAGNOSIS — K31811 Angiodysplasia of stomach and duodenum with bleeding: Secondary | ICD-10-CM | POA: Diagnosis present

## 2020-08-24 DIAGNOSIS — I252 Old myocardial infarction: Secondary | ICD-10-CM | POA: Diagnosis not present

## 2020-08-24 DIAGNOSIS — Z8249 Family history of ischemic heart disease and other diseases of the circulatory system: Secondary | ICD-10-CM | POA: Diagnosis not present

## 2020-08-24 DIAGNOSIS — Z800 Family history of malignant neoplasm of digestive organs: Secondary | ICD-10-CM | POA: Diagnosis not present

## 2020-08-24 DIAGNOSIS — K5520 Angiodysplasia of colon without hemorrhage: Secondary | ICD-10-CM | POA: Diagnosis not present

## 2020-08-24 DIAGNOSIS — Z7982 Long term (current) use of aspirin: Secondary | ICD-10-CM | POA: Diagnosis not present

## 2020-08-24 DIAGNOSIS — E785 Hyperlipidemia, unspecified: Secondary | ICD-10-CM | POA: Diagnosis present

## 2020-08-24 DIAGNOSIS — K5730 Diverticulosis of large intestine without perforation or abscess without bleeding: Secondary | ICD-10-CM | POA: Diagnosis present

## 2020-08-24 DIAGNOSIS — I130 Hypertensive heart and chronic kidney disease with heart failure and stage 1 through stage 4 chronic kidney disease, or unspecified chronic kidney disease: Secondary | ICD-10-CM | POA: Diagnosis present

## 2020-08-24 DIAGNOSIS — I2510 Atherosclerotic heart disease of native coronary artery without angina pectoris: Secondary | ICD-10-CM | POA: Diagnosis present

## 2020-08-24 DIAGNOSIS — K5521 Angiodysplasia of colon with hemorrhage: Secondary | ICD-10-CM | POA: Diagnosis not present

## 2020-08-24 DIAGNOSIS — Z955 Presence of coronary angioplasty implant and graft: Secondary | ICD-10-CM | POA: Diagnosis not present

## 2020-08-24 DIAGNOSIS — I350 Nonrheumatic aortic (valve) stenosis: Secondary | ICD-10-CM | POA: Diagnosis present

## 2020-08-24 DIAGNOSIS — I5032 Chronic diastolic (congestive) heart failure: Secondary | ICD-10-CM | POA: Diagnosis present

## 2020-08-24 DIAGNOSIS — J449 Chronic obstructive pulmonary disease, unspecified: Secondary | ICD-10-CM | POA: Diagnosis present

## 2020-08-24 HISTORY — PX: GIVENS CAPSULE STUDY: SHX5432

## 2020-08-24 LAB — CBC
HCT: 28.2 % — ABNORMAL LOW (ref 36.0–46.0)
Hemoglobin: 8.6 g/dL — ABNORMAL LOW (ref 12.0–15.0)
MCH: 26.7 pg (ref 26.0–34.0)
MCHC: 30.5 g/dL (ref 30.0–36.0)
MCV: 87.6 fL (ref 80.0–100.0)
Platelets: 335 10*3/uL (ref 150–400)
RBC: 3.22 MIL/uL — ABNORMAL LOW (ref 3.87–5.11)
RDW: 18.5 % — ABNORMAL HIGH (ref 11.5–15.5)
WBC: 6.2 10*3/uL (ref 4.0–10.5)
nRBC: 0 % (ref 0.0–0.2)

## 2020-08-24 LAB — PROTIME-INR
INR: 1 (ref 0.8–1.2)
Prothrombin Time: 12.6 seconds (ref 11.4–15.2)

## 2020-08-24 LAB — COMPREHENSIVE METABOLIC PANEL
ALT: 11 U/L (ref 0–44)
AST: 20 U/L (ref 15–41)
Albumin: 3 g/dL — ABNORMAL LOW (ref 3.5–5.0)
Alkaline Phosphatase: 80 U/L (ref 38–126)
Anion gap: 11 (ref 5–15)
BUN: 20 mg/dL (ref 6–20)
CO2: 23 mmol/L (ref 22–32)
Calcium: 8.6 mg/dL — ABNORMAL LOW (ref 8.9–10.3)
Chloride: 104 mmol/L (ref 98–111)
Creatinine, Ser: 2.04 mg/dL — ABNORMAL HIGH (ref 0.44–1.00)
GFR, Estimated: 28 mL/min — ABNORMAL LOW (ref >60)
Glucose, Bld: 102 mg/dL — ABNORMAL HIGH (ref 70–99)
Potassium: 4.3 mmol/L (ref 3.5–5.1)
Sodium: 138 mmol/L (ref 135–145)
Total Bilirubin: 0.4 mg/dL (ref 0.3–1.2)
Total Protein: 5.9 g/dL — ABNORMAL LOW (ref 6.5–8.1)

## 2020-08-24 LAB — HEMOGLOBIN AND HEMATOCRIT, BLOOD
HCT: 23.7 % — ABNORMAL LOW (ref 36.0–46.0)
HCT: 24.1 % — ABNORMAL LOW (ref 36.0–46.0)
HCT: 27.1 % — ABNORMAL LOW (ref 36.0–46.0)
HCT: 26.9 % — ABNORMAL LOW (ref 36.0–46.0)
Hemoglobin: 6.7 g/dL — CL (ref 12.0–15.0)
Hemoglobin: 6.8 g/dL — CL (ref 12.0–15.0)
Hemoglobin: 8.3 g/dL — ABNORMAL LOW (ref 12.0–15.0)
Hemoglobin: 8.3 g/dL — ABNORMAL LOW (ref 12.0–15.0)

## 2020-08-24 LAB — PREPARE RBC (CROSSMATCH)

## 2020-08-24 SURGERY — IMAGING PROCEDURE, GI TRACT, INTRALUMINAL, VIA CAPSULE
Anesthesia: LOCAL

## 2020-08-24 MED ORDER — ACETAMINOPHEN 325 MG PO TABS
650.0000 mg | ORAL_TABLET | Freq: Four times a day (QID) | ORAL | Status: DC | PRN
  Administered 2020-08-24: 650 mg via ORAL
  Filled 2020-08-24: qty 2

## 2020-08-24 MED ORDER — ATENOLOL 25 MG PO TABS
25.0000 mg | ORAL_TABLET | Freq: Every day | ORAL | Status: DC
  Administered 2020-08-24 – 2020-08-27 (×4): 25 mg via ORAL
  Filled 2020-08-24 (×4): qty 1

## 2020-08-24 MED ORDER — SODIUM CHLORIDE 0.9% IV SOLUTION: Freq: Once | INTRAVENOUS | Status: AC

## 2020-08-24 SURGICAL SUPPLY — 1 items: TOWEL COTTON PACK 4EA (MISCELLANEOUS) ×4 IMPLANT

## 2020-08-24 NOTE — Progress Notes (Signed)
PROGRESS NOTE    [name]  LNL:[id number] DOB: 1960/10/29 DOA: 08/23/2020 PCP: Bernerd Limbo, MD   Brief Narrative: [name] is a 59 y.o. female with medical history significant of CHF, COPD, partial colectomy. Patient presented secondary rectal bleeding with concern for GI bleeding. Gastroenterology consulted on admission.   Assessment & Plan:   Principal Problem:   GIB (gastrointestinal bleeding) Active Problems:   Anxiety state   Essential hypertension   CKD (chronic kidney disease) stage 3, GFR 30-59 ml/min (HCC)   Acute blood loss anemia   Hematochezia   Diverticulosis of colon without hemorrhage   Pain of upper abdomen   GI bleeding Acute blood loss anemia Gastroenterology consulted on admission with plan for VCE today. Patient with continued hematochezia overnight. 1 unit of PRBC transfused on 11/24 for a hemoglobin of 6.8 -Gastroenterology recommendations: video capsule endoscopy -Serial CBC/H&H  Abdominal pain CT imagery consistent with diarrheal illness without evidence of colitis. Initial concern for possible constipation for which she was prescribed Linzess as an outpatient.  CKD stage IIIb Baseline creatinine of about 1.8-1.9. Stable.  Hyperlipidemia -Continue Zetia and Lipitor  Anxiety -Continue Effexor XR and Xanax as needed  COPD Stable. -Continue Dulera  Essential hypertension Patient is on Imdur, atenolol, amlodipine -Continue Imdur, amlodipine -Restart home atenolol  Chronic diastolic heart failure Stable and appears slightly on the dry side.  GERD -Continue Protonix  PAD Patient is on aspirin, Plavix.  He has been held on admission secondary to GI bleeding. -GI recommendations for reinitiation of aspirin and Plavix treatment.   DVT prophylaxis: SCDs Code Status:   Code Status: Full Code Family Communication: None at bedside Disposition Plan: Discharge likely in 24-72 hours pending GI  workup   Consultants:   Gastroenterology  Procedures:   VIDEO CAPSULE ENDOSCOPY  Antimicrobials:  None    Subjective: Continued hematochezia overnight. Thirsty.  Objective: Vitals:   08/24/20 0827 08/24/20 0848 08/24/20 1004 08/24/20 1350  BP:  (!) 179/77 (!) 172/73 (!) 146/61  Pulse:  74 81 79  Resp:  20 20 18   Temp:  98.5 F (36.9 C) 99.1 F (37.3 C) 98.6 F (37 C)  TempSrc:  Oral Oral Oral  SpO2: 93% 93% 91% 93%  Weight:      Height:        Intake/Output Summary (Last 24 hours) at 08/24/2020 1401 Last data filed at 08/24/2020 1100 Gross per 24 hour  Intake 364 ml  Output --  Net 364 ml   Filed Weights   08/23/20 1038  Weight: 44.9 kg    Examination:  General exam: Appears calm and comfortable Respiratory system: Mild wheezing. Respiratory effort normal. Cardiovascular system: S1 & S2 heard, RRR. No murmurs, rubs, gallops or clicks. Gastrointestinal system: Abdomen is nondistended, soft. No organomegaly or masses felt. Normal bowel sounds heard. Central nervous system: Alert and oriented. No focal neurological deficits. Musculoskeletal: No edema. No calf tenderness Skin: No cyanosis. No rashes Psychiatry: Judgement and insight appear normal. Mood & affect appropriate.     Data Reviewed: I have personally reviewed following labs and imaging studies  CBC Lab Results  Component Value Date   WBC 6.2 08/24/2020   RBC 3.22 (L) 08/24/2020   HGB 8.6 (L) 08/24/2020   HCT 28.2 (L) 08/24/2020   MCV 87.6 08/24/2020   MCH 26.7 08/24/2020   PLT 335 08/24/2020   MCHC 30.5 08/24/2020   RDW 18.5 (H) 08/24/2020   LYMPHSABS 1.3 08/23/2020   MONOABS 0.4 08/23/2020  EOSABS 0.2 08/23/2020   BASOSABS 0.1 14/97/0263     Last metabolic panel Lab Results  Component Value Date   NA 138 08/24/2020   K 4.3 08/24/2020   CL 104 08/24/2020   CO2 23 08/24/2020   BUN 20 08/24/2020   CREATININE 2.04 (H) 08/24/2020   GLUCOSE 102 (H) 08/24/2020   GFRNONAA 28  (L) 08/24/2020   GFRAA 25 (L) 06/24/2020   CALCIUM 8.6 (L) 08/24/2020   PROT 5.9 (L) 08/24/2020   ALBUMIN 3.0 (L) 08/24/2020   BILITOT 0.4 08/24/2020   ALKPHOS 80 08/24/2020   AST 20 08/24/2020   ALT 11 08/24/2020   ANIONGAP 11 08/24/2020    CBG (last 3)  No results for input(s): GLUCAP in the last 72 hours.   GFR: Estimated Creatinine Clearance: 21.6 mL/min (A) (by C-G formula based on SCr of 2.04 mg/dL (H)).  Coagulation Profile: Recent Labs  Lab 08/23/20 1048 08/24/20 0407  INR 0.9 1.0    Recent Results (from the past 240 hour(s))  Resp Panel by RT-PCR (Flu A&B, Covid) Nasopharyngeal Swab     Status: None   Collection Time: 08/23/20 11:55 AM   Specimen: Nasopharyngeal Swab; Nasopharyngeal(NP) swabs in vial transport medium  Result Value Ref Range Status   SARS Coronavirus 2 by RT PCR NEGATIVE NEGATIVE Final    Comment: (NOTE) SARS-CoV-2 target nucleic acids are NOT DETECTED.  The SARS-CoV-2 RNA is generally detectable in upper respiratory specimens during the acute phase of infection. The lowest concentration of SARS-CoV-2 viral copies this assay can detect is 138 copies/mL. A negative result does not preclude SARS-Cov-2 infection and should not be used as the sole basis for treatment or other patient management decisions. A negative result may occur with  improper specimen collection/handling, submission of specimen other than nasopharyngeal swab, presence of viral mutation(s) within the areas targeted by this assay, and inadequate number of viral copies(<138 copies/mL). A negative result must be combined with clinical observations, patient history, and epidemiological information. The expected result is Negative.  Fact Sheet for Patients:  EntrepreneurPulse.com.au  Fact Sheet for Healthcare Providers:  IncredibleEmployment.be  This test is no t yet approved or cleared by the [place] FDA and  has been authorized for  detection and/or diagnosis of SARS-CoV-2 by FDA under an Emergency Use Authorization (EUA). This EUA will remain  in effect (meaning this test can be used) for the duration of the COVID-19 declaration under Section 564(b)(1) of the Act, 21 U.S.C.section 360bbb-3(b)(1), unless the authorization is terminated  or revoked sooner.       Influenza A by PCR NEGATIVE NEGATIVE Final   Influenza B by PCR NEGATIVE NEGATIVE Final    Comment: (NOTE) The Xpert Xpress SARS-CoV-2/FLU/RSV plus assay is intended as an aid in the diagnosis of influenza from Nasopharyngeal swab specimens and should not be used as a sole basis for treatment. Nasal washings and aspirates are unacceptable for Xpert Xpress SARS-CoV-2/FLU/RSV testing.  Fact Sheet for Patients: EntrepreneurPulse.com.au  Fact Sheet for Healthcare Providers: IncredibleEmployment.be  This test is not yet approved or cleared by the [place] FDA and has been authorized for detection and/or diagnosis of SARS-CoV-2 by FDA under an Emergency Use Authorization (EUA). This EUA will remain in effect (meaning this test can be used) for the duration of the COVID-19 declaration under Section 564(b)(1) of the Act, 21 U.S.C. section 360bbb-3(b)(1), unless the authorization is terminated or revoked.  Performed at [facility], [place] [address], [place], [place] [address]  Radiology Studies: CT ABDOMEN PELVIS WO CONTRAST  Result Date: 08/24/2020 CLINICAL DATA:  Abdominal pain EXAM: CT ABDOMEN AND PELVIS WITHOUT CONTRAST TECHNIQUE: Multidetector CT imaging of the abdomen and pelvis was performed following the standard protocol without IV contrast. COMPARISON:  08/04/2020 FINDINGS: LOWER CHEST: Normal. HEPATOBILIARY: Normal hepatic contours. No intra- or extrahepatic biliary dilatation. The gallbladder is normal. PANCREAS: Normal pancreas. No ductal dilatation or peripancreatic fluid  collection. SPLEEN: Normal. ADRENALS/URINARY TRACT: The adrenal glands are normal. No hydronephrosis, nephroureterolithiasis or solid renal mass. The urinary bladder is normal for degree of distention STOMACH/BOWEL: There is no hiatal hernia. Normal duodenal course and caliber. No small bowel dilatation or inflammation. The colon is largely fluid-filled. VASCULAR/LYMPHATIC: There is calcific atherosclerosis of the abdominal aorta. No lymphadenopathy. REPRODUCTIVE: Status post hysterectomy. No adnexal mass. MUSCULOSKELETAL. No bony spinal canal stenosis or focal osseous abnormality. OTHER: None. IMPRESSION: 1. No acute abnormality of the abdomen or pelvis. 2. Fluid-filled colon, consistent with diarrheal illness. Aortic Atherosclerosis (ICD10-I70.0). Electronically Signed   By: Ulyses Jarred M.D.   On: 08/24/2020 00:38        Scheduled Meds:  amLODipine  10 mg Oral Daily   atorvastatin  80 mg Oral Daily   ezetimibe  10 mg Oral Daily   isosorbide mononitrate  60 mg Oral Daily   mometasone-formoterol  2 puff Inhalation BID   pantoprazole (PROTONIX) IV  40 mg Intravenous BID   venlafaxine XR  300 mg Oral Q breakfast   Continuous Infusions:   LOS: 0 days     Cordelia Poche, MD [facility] 08/24/2020, 2:01 PM  If 7PM-7AM, please contact night-coverage [email or web address]

## 2020-08-24 NOTE — Progress Notes (Signed)
Patient ingested PillCam at 1240 with out any difficulties. Patient and bedside nurse provided with verbal and written instructions.

## 2020-08-24 NOTE — Progress Notes (Addendum)
[facility] Progress Note  CC:  Hematochezia, anemia   Subjective:  She is NPO for small bowel capsule endoscopy today. She continues to have some RUQ and LUQ pain, not severe. She is passing watery stool with light red blood since taking the bowel prep last night. She is hungry and wishes to eat.   Objective:  Vital signs in last 24 hours: Temp:  [97.4 F (36.3 C)-99.1 F (37.3 C)] 99.1 F (37.3 C) (11/24 1004) Pulse Rate:  [61-81] 81 (11/24 1004) Resp:  [14-22] 20 (11/24 1004) BP: (120-179)/(59-85) 172/73 (11/24 1004) SpO2:  [89 %-98 %] 91 % (11/24 1004) Last BM Date: 08/23/20 General:   Alert 59 year old female in NAD. Heart: RRR, systolic murmur.  Pulm:  Breath sounds clear throughout.  Abdomen: Soft, nondistended.  Mild tenderness to the right upper and left upper quadrants without rebound or guarding.  Positive bowel sounds to all 4 quadrants.  Positive bruit to the upper abdomen.  Midline abdominal scar intact.  No HSM.  No mass Extremities:  Without edema. Neurologic:  Alert and  oriented x4;  grossly normal neurologically. Psych:  Alert and cooperative. Normal mood and affect.  Intake/Output from previous day: No intake/output data recorded. Intake/Output this shift: Total I/O In: 364 [Blood:364] Out: -   Lab Results: Recent Labs    08/22/20 1358 08/22/20 1358 08/23/20 1048 08/23/20 1048 08/23/20 1611 08/23/20 2126 08/24/20 0407  WBC 7.4  --  6.8  --   --   --   --   HGB 8.8 Repeated and verified X2.*   < > 7.9*   < > 7.4* 7.6* 6.8*  6.7*  HCT 27.2*   < > 26.3*   < > 25.8* 26.9* 24.1*  23.7*  PLT 560.0*  --  408*  --   --   --   --    < > = values in this interval not displayed.   BMET Recent Labs    08/23/20 1048 08/24/20 0407  NA 137 138  K 4.5 4.3  CL 105 104  CO2 24 23  GLUCOSE 105* 102*  BUN 20 20  CREATININE 1.90* 2.04*  CALCIUM 8.8* 8.6*   LFT Recent Labs    08/24/20 0407  PROT 5.9*  ALBUMIN 3.0*  AST 20  ALT  11  ALKPHOS 80  BILITOT 0.4   PT/INR Recent Labs    08/23/20 1048 08/24/20 0407  LABPROT 12.2 12.6  INR 0.9 1.0   Hepatitis Panel No results for input(s): HEPBSAG, HCVAB, HEPAIGM, HEPBIGM in the last 72 hours.  CT ABDOMEN PELVIS WO CONTRAST  Result Date: 08/24/2020 CLINICAL DATA:  Abdominal pain EXAM: CT ABDOMEN AND PELVIS WITHOUT CONTRAST TECHNIQUE: Multidetector CT imaging of the abdomen and pelvis was performed following the standard protocol without IV contrast. COMPARISON:  08/04/2020 FINDINGS: LOWER CHEST: Normal. HEPATOBILIARY: Normal hepatic contours. No intra- or extrahepatic biliary dilatation. The gallbladder is normal. PANCREAS: Normal pancreas. No ductal dilatation or peripancreatic fluid collection. SPLEEN: Normal. ADRENALS/URINARY TRACT: The adrenal glands are normal. No hydronephrosis, nephroureterolithiasis or solid renal mass. The urinary bladder is normal for degree of distention STOMACH/BOWEL: There is no hiatal hernia. Normal duodenal course and caliber. No small bowel dilatation or inflammation. The colon is largely fluid-filled. VASCULAR/LYMPHATIC: There is calcific atherosclerosis of the abdominal aorta. No lymphadenopathy. REPRODUCTIVE: Status post hysterectomy. No adnexal mass. MUSCULOSKELETAL. No bony spinal canal stenosis or focal osseous abnormality. OTHER: None. IMPRESSION: 1. No acute abnormality of the abdomen  or pelvis. 2. Fluid-filled colon, consistent with diarrheal illness. Aortic Atherosclerosis (ICD10-I70.0). Electronically Signed   By: Ulyses Jarred M.D.   On: 08/24/2020 00:38    Assessment / Plan:  75.  59 year old female with recurrent lower GI bleed and anemia.  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  EGD 11/7 showed retained food, no evidence of GI bleeding. She was readmitted to  [facility] 11/23 with recurrent hematochezia and RUQ/LUQ pain. Admission Hg 7.9 -> 7.4 -> 7.6 -> 6.8. Transfused 1 unit of PRBCs this am. Post transfusion H/H pending. She remains hemodynamically stable. Colonoscopy verses a small bowel capsule endoscopy was discussed with the patient, she elected to proceed with a small bowel capsule endoscopy.  -Await post transfusion H/H -Transfuse for Hg < 8 -NPO for placement of small bowel capsule endoscopy today. Follow post capsule endoscopy instructions (typically NPO for 2 hours post capsule endoscopy, then clear liquids x 2 hours then solid food at tolerated). -Further recommendations to be determined after SBCE results reviewed.  -Continue IVF -Continue PPI IV bid  2.  Acute on chronic anemia. -See plan in # 1  3.  History of perforated diverticulitis status post colectomy in 2008  4.  History of adenomatous colon polyps per colonoscopy in 2018  5.  History of COPD  6. History of CAD. Normal cardiac catheterization 03/2018.   7. CKD. Cr. 1.90 -> 2.04.   8. Past abdominal aortic stent, peripheral arterial disease on Plavix. Last dose of Plavix was at 11:30am on 11/22.   Further recommendations per Dr. Henrene Pastor          Active Problems:   Acute blood loss anemia   GIB (gastrointestinal bleeding)   Hematochezia   Diverticulosis of colon without hemorrhage   Pain of upper abdomen     LOS: 0 days   [name]  08/24/2020, 11:21 AM  GI ATTENDING  Interval history data reviewed.  Patient seen and examined.  Agree with interval progress note as outlined.  Endoscopy nurse about to administer 8-hour capsule.  Patient still reports to me what sounds like bleeding (somewhat darker stools with leaching of blood).  Receiving 1 unit of packed red blood cells for drifting hemoglobin, now below 7.  Hemodynamically stable.  No abdominal complaints.  She is frustrated and hungry.  Await results of capsule endoscopy.  She may have  small or large bowel AVMs.  This will be her second day off Plavix.  GI inpatient team will continue to follow.  Docia Chuck. Geri Seminole., M.D. [facility] of Gastroenterology

## 2020-08-25 DIAGNOSIS — F411 Generalized anxiety disorder: Secondary | ICD-10-CM | POA: Diagnosis not present

## 2020-08-25 DIAGNOSIS — K922 Gastrointestinal hemorrhage, unspecified: Secondary | ICD-10-CM | POA: Diagnosis not present

## 2020-08-25 DIAGNOSIS — D62 Acute posthemorrhagic anemia: Secondary | ICD-10-CM | POA: Diagnosis not present

## 2020-08-25 DIAGNOSIS — N1832 Chronic kidney disease, stage 3b: Secondary | ICD-10-CM | POA: Diagnosis not present

## 2020-08-25 LAB — HEMOGLOBIN AND HEMATOCRIT, BLOOD
HCT: 27.4 % — ABNORMAL LOW (ref 36.0–46.0)
HCT: 29.7 % — ABNORMAL LOW (ref 36.0–46.0)
HCT: 30.2 % — ABNORMAL LOW (ref 36.0–46.0)
HCT: 28.5 % — ABNORMAL LOW (ref 36.0–46.0)
Hemoglobin: 8.4 g/dL — ABNORMAL LOW (ref 12.0–15.0)
Hemoglobin: 9.2 g/dL — ABNORMAL LOW (ref 12.0–15.0)
Hemoglobin: 9.1 g/dL — ABNORMAL LOW (ref 12.0–15.0)
Hemoglobin: 8.6 g/dL — ABNORMAL LOW (ref 12.0–15.0)

## 2020-08-25 LAB — BASIC METABOLIC PANEL
Anion gap: 10 (ref 5–15)
BUN: 14 mg/dL (ref 6–20)
CO2: 24 mmol/L (ref 22–32)
Calcium: 8.9 mg/dL (ref 8.9–10.3)
Chloride: 102 mmol/L (ref 98–111)
Creatinine, Ser: 1.8 mg/dL — ABNORMAL HIGH (ref 0.44–1.00)
GFR, Estimated: 32 mL/min — ABNORMAL LOW (ref >60)
Glucose, Bld: 100 mg/dL — ABNORMAL HIGH (ref 70–99)
Potassium: 3.7 mmol/L (ref 3.5–5.1)
Sodium: 136 mmol/L (ref 135–145)

## 2020-08-25 LAB — TYPE AND SCREEN
ABO/RH(D): O NEG
Antibody Screen: NEGATIVE
Unit division: 0

## 2020-08-25 LAB — CBC
HCT: 27.4 % — ABNORMAL LOW (ref 36.0–46.0)
Hemoglobin: 8.5 g/dL — ABNORMAL LOW (ref 12.0–15.0)
MCH: 26.5 pg (ref 26.0–34.0)
MCHC: 31 g/dL (ref 30.0–36.0)
MCV: 85.4 fL (ref 80.0–100.0)
Platelets: 315 10*3/uL (ref 150–400)
RBC: 3.21 MIL/uL — ABNORMAL LOW (ref 3.87–5.11)
RDW: 18.7 % — ABNORMAL HIGH (ref 11.5–15.5)
WBC: 5.2 10*3/uL (ref 4.0–10.5)
nRBC: 0 % (ref 0.0–0.2)

## 2020-08-25 LAB — BPAM RBC
Blood Product Expiration Date: 202112252359
ISSUE DATE / TIME: 202111240526
Unit Type and Rh: 9500

## 2020-08-25 MED ORDER — OXYCODONE-ACETAMINOPHEN 5-325 MG PO TABS
1.0000 | ORAL_TABLET | Freq: Four times a day (QID) | ORAL | Status: DC | PRN
  Administered 2020-08-25: 2 via ORAL
  Administered 2020-08-25 (×2): 1 via ORAL
  Administered 2020-08-26 (×2): 2 via ORAL
  Administered 2020-08-26: 17:00:00 1 via ORAL
  Administered 2020-08-26 – 2020-08-27 (×2): 2 via ORAL
  Filled 2020-08-25: qty 2
  Filled 2020-08-25: qty 1
  Filled 2020-08-25 (×5): qty 2
  Filled 2020-08-25: qty 1

## 2020-08-25 NOTE — Procedures (Signed)
VCE complete and notable for the following:  Findings: 1) Complete study with mostly adequate prep. Some areas of reduced visualization in the distal small bowel 2) Small, non-bleeding gastric erosion vs focal gastritis 3) Small AVM in proximal duodenum, located immediately after pylorus 4) Small non-bleeding AVM in mid small bowel, located about 1 hr 10 minutes beyond pylorus (about 2 hr 42 minutes from cecum) 5) Small non-bleeding possible AVM in proximal colon 6) Areas of either blood vs particulate matter in proximal colon without active bleeding  Impression: 1)2 small, non-bleeding AVMs scattered in the very proximal colon x1 and mid small bowel x1 2) Small, non-bleeding AVM in proximal colon along with either old blood or just red particulate matter in the colon 3) Small area of focal gastritis vs gastric erosion, not likely of clinical consequence.   The proximal most small bowel AVM is certainly within reach of endoscope, and the 2nd mid small bowel is potentially within reach of enteroscopy.   Similarly, the small, non-bleeding AVM in the proximal colon can be evaluated and treated with colonoscopy.   Recommendations: - Plan for clears on 11/26 with bowel prep in the evening for tentative plan of push enteroscopy and colonoscopy on 11/27 following Plavix washout - Continue serial H/H checks - Will discuss results with patient in person on 11/26 with full formulation of plan

## 2020-08-25 NOTE — Progress Notes (Signed)
PROGRESS NOTE    [name]  PJA:[id number] DOB: [birth year] DOA: 08/23/2020 PCP: Cheyenne Limbo, MD   Brief Narrative: [name] is a 59 y.o. female with medical history significant of CHF, COPD, partial colectomy. Patient presented secondary rectal bleeding with concern for GI bleeding. Gastroenterology consulted on admission.   Assessment & Plan:   Principal Problem:   GIB (gastrointestinal bleeding) Active Problems:   Anxiety state   Essential hypertension   CKD (chronic kidney disease) stage 3, GFR 30-59 ml/min (HCC)   Acute blood loss anemia   Hematochezia   Diverticulosis of colon without hemorrhage   Pain of upper abdomen   GI bleeding Acute blood loss anemia Gastroenterology consulted on admission. 1 unit of PRBC transfused on 11/24 for a hemoglobin of 6.8. Hemoglobin currently stable. Patient reports normal appearing stool with last bowel movement. -Gastroenterology recommendations: video capsule endoscopy results pending -Serial CBC/H&H  Abdominal pain CT imagery consistent with diarrheal illness without evidence of colitis. Initial concern for possible constipation for which she was prescribed Linzess as an outpatient. -Continue analgesics prn  CKD stage IIIb Baseline creatinine of about 1.8-1.9. Stable.  Hyperlipidemia -Continue Zetia and Lipitor  Anxiety -Continue Effexor XR and Xanax as needed  COPD Stable. -Continue Dulera  Essential hypertension Patient is on Imdur, atenolol, amlodipine -Continue Imdur, amlodipine, atenolol  Chronic diastolic heart failure Stable.  GERD -Continue Protonix  PAD Patient is on aspirin, Plavix.  He has been held on admission secondary to GI bleeding. -GI recommendations for reinitiation of aspirin and Plavix treatment.   DVT prophylaxis: SCDs Code Status:   Code Status: Full Code Family Communication: None at bedside Disposition Plan: Discharge likely in 24-48 hours pending GI  workup/recommendations   Consultants:   Gastroenterology  Procedures:   VIDEO CAPSULE ENDOSCOPY (11/24>>11/25)  Antimicrobials:  None    Subjective: Normal appearing bowel movement yesterday per patient. Continued abdominal pain.   Objective: Vitals:   08/24/20 1937 08/24/20 1959 08/25/20 0446 08/25/20 0746  BP:  (!) 146/56 (!) 161/79   Pulse:  66 62   Resp:  18 18   Temp:  98.1 F (36.7 C) 98.3 F (36.8 C)   TempSrc:  Oral Oral   SpO2: 94% 92% 94% 94%  Weight:      Height:        Intake/Output Summary (Last 24 hours) at 08/25/2020 1135 Last data filed at 08/25/2020 1000 Gross per 24 hour  Intake 720 ml  Output 0 ml  Net 720 ml   Filed Weights   08/23/20 1038  Weight: 44.9 kg    Examination:  General exam: Appears calm and comfortable Respiratory system: Clear to auscultation. Respiratory effort normal. Cardiovascular system: S1 & S2 heard, RRR. No murmurs, rubs, gallops or clicks. Gastrointestinal system: Abdomen is nondistended, soft and mildly tender diffusely. No organomegaly or masses felt. Normal bowel sounds heard. Central nervous system: Alert and oriented. No focal neurological deficits. Musculoskeletal: No edema. No calf tenderness Skin: No cyanosis. No rashes Psychiatry: Judgement and insight appear normal. Mood & affect appropriate.     Data Reviewed: I have personally reviewed following labs and imaging studies  CBC Lab Results  Component Value Date   WBC 5.2 08/25/2020   RBC 3.21 (L) 08/25/2020   HGB 9.2 (L) 08/25/2020   HCT 29.7 (L) 08/25/2020   MCV 85.4 08/25/2020   MCH 26.5 08/25/2020   PLT 315 08/25/2020   MCHC 31.0 08/25/2020   RDW 18.7 (H) 08/25/2020   LYMPHSABS  1.3 08/23/2020   MONOABS 0.4 08/23/2020   EOSABS 0.2 08/23/2020   BASOSABS 0.1 81/44/8185     Last metabolic panel Lab Results  Component Value Date   NA 136 08/25/2020   K 3.7 08/25/2020   CL 102 08/25/2020   CO2 24 08/25/2020   BUN 14 08/25/2020    CREATININE 1.80 (H) 08/25/2020   GLUCOSE 100 (H) 08/25/2020   GFRNONAA 32 (L) 08/25/2020   GFRAA 25 (L) 06/24/2020   CALCIUM 8.9 08/25/2020   PROT 5.9 (L) 08/24/2020   ALBUMIN 3.0 (L) 08/24/2020   BILITOT 0.4 08/24/2020   ALKPHOS 80 08/24/2020   AST 20 08/24/2020   ALT 11 08/24/2020   ANIONGAP 10 08/25/2020    CBG (last 3)  No results for input(s): GLUCAP in the last 72 hours.   GFR: Estimated Creatinine Clearance: 24.4 mL/min (A) (by C-G formula based on SCr of 1.8 mg/dL (H)).  Coagulation Profile: Recent Labs  Lab 08/23/20 1048 08/24/20 0407  INR 0.9 1.0    Recent Results (from the past 240 hour(s))  Resp Panel by RT-PCR (Flu A&B, Covid) Nasopharyngeal Swab     Status: None   Collection Time: 08/23/20 11:55 AM   Specimen: Nasopharyngeal Swab; Nasopharyngeal(NP) swabs in vial transport medium  Result Value Ref Range Status   SARS Coronavirus 2 by RT PCR NEGATIVE NEGATIVE Final    Comment: (NOTE) SARS-CoV-2 target nucleic acids are NOT DETECTED.  The SARS-CoV-2 RNA is generally detectable in upper respiratory specimens during the acute phase of infection. The lowest concentration of SARS-CoV-2 viral copies this assay can detect is 138 copies/mL. A negative result does not preclude SARS-Cov-2 infection and should not be used as the sole basis for treatment or other patient management decisions. A negative result may occur with  improper specimen collection/handling, submission of specimen other than nasopharyngeal swab, presence of viral mutation(s) within the areas targeted by this assay, and inadequate number of viral copies(<138 copies/mL). A negative result must be combined with clinical observations, patient history, and epidemiological information. The expected result is Negative.  Fact Sheet for Patients:  EntrepreneurPulse.com.au  Fact Sheet for Healthcare Providers:  IncredibleEmployment.be  This test is no t yet  approved or cleared by the [place] FDA and  has been authorized for detection and/or diagnosis of SARS-CoV-2 by FDA under an Emergency Use Authorization (EUA). This EUA will remain  in effect (meaning this test can be used) for the duration of the COVID-19 declaration under Section 564(b)(1) of the Act, 21 U.S.C.section 360bbb-3(b)(1), unless the authorization is terminated  or revoked sooner.       Influenza A by PCR NEGATIVE NEGATIVE Final   Influenza B by PCR NEGATIVE NEGATIVE Final    Comment: (NOTE) The Xpert Xpress SARS-CoV-2/FLU/RSV plus assay is intended as an aid in the diagnosis of influenza from Nasopharyngeal swab specimens and should not be used as a sole basis for treatment. Nasal washings and aspirates are unacceptable for Xpert Xpress SARS-CoV-2/FLU/RSV testing.  Fact Sheet for Patients: EntrepreneurPulse.com.au  Fact Sheet for Healthcare Providers: IncredibleEmployment.be  This test is not yet approved or cleared by the [place] FDA and has been authorized for detection and/or diagnosis of SARS-CoV-2 by FDA under an Emergency Use Authorization (EUA). This EUA will remain in effect (meaning this test can be used) for the duration of the COVID-19 declaration under Section 564(b)(1) of the Act, 21 U.S.C. section 360bbb-3(b)(1), unless the authorization is terminated or revoked.  Performed at [facility],  [place] [address], [place], [place] [address]         Radiology Studies: CT ABDOMEN PELVIS WO CONTRAST  Result Date: 08/24/2020 CLINICAL DATA:  Abdominal pain EXAM: CT ABDOMEN AND PELVIS WITHOUT CONTRAST TECHNIQUE: Multidetector CT imaging of the abdomen and pelvis was performed following the standard protocol without IV contrast. COMPARISON:  08/04/2020 FINDINGS: LOWER CHEST: Normal. HEPATOBILIARY: Normal hepatic contours. No intra- or extrahepatic biliary dilatation. The gallbladder is normal.  PANCREAS: Normal pancreas. No ductal dilatation or peripancreatic fluid collection. SPLEEN: Normal. ADRENALS/URINARY TRACT: The adrenal glands are normal. No hydronephrosis, nephroureterolithiasis or solid renal mass. The urinary bladder is normal for degree of distention STOMACH/BOWEL: There is no hiatal hernia. Normal duodenal course and caliber. No small bowel dilatation or inflammation. The colon is largely fluid-filled. VASCULAR/LYMPHATIC: There is calcific atherosclerosis of the abdominal aorta. No lymphadenopathy. REPRODUCTIVE: Status post hysterectomy. No adnexal mass. MUSCULOSKELETAL. No bony spinal canal stenosis or focal osseous abnormality. OTHER: None. IMPRESSION: 1. No acute abnormality of the abdomen or pelvis. 2. Fluid-filled colon, consistent with diarrheal illness. Aortic Atherosclerosis (ICD10-I70.0). Electronically Signed   By: Ulyses Jarred M.D.   On: 08/24/2020 00:38        Scheduled Meds:  amLODipine  10 mg Oral Daily   atenolol  25 mg Oral Daily   atorvastatin  80 mg Oral Daily   ezetimibe  10 mg Oral Daily   isosorbide mononitrate  60 mg Oral Daily   mometasone-formoterol  2 puff Inhalation BID   pantoprazole (PROTONIX) IV  40 mg Intravenous BID   venlafaxine XR  300 mg Oral Q breakfast   Continuous Infusions:   LOS: 1 day     Cordelia Poche, MD [facility] 08/25/2020, 11:35 AM  If 7PM-7AM, please contact night-coverage [email or web address]

## 2020-08-25 NOTE — Progress Notes (Signed)
Brief GI update: Small bowel capsule endoscopy results pending. Hg stable today 9.2. Our service will round on patient Friday 11/26. Call our service for any concerns.

## 2020-08-26 ENCOUNTER — Encounter (HOSPITAL_COMMUNITY): Payer: Self-pay | Admitting: Gastroenterology

## 2020-08-26 DIAGNOSIS — F411 Generalized anxiety disorder: Secondary | ICD-10-CM | POA: Diagnosis not present

## 2020-08-26 DIAGNOSIS — D62 Acute posthemorrhagic anemia: Secondary | ICD-10-CM | POA: Diagnosis not present

## 2020-08-26 DIAGNOSIS — K5730 Diverticulosis of large intestine without perforation or abscess without bleeding: Secondary | ICD-10-CM | POA: Diagnosis not present

## 2020-08-26 DIAGNOSIS — N1832 Chronic kidney disease, stage 3b: Secondary | ICD-10-CM | POA: Diagnosis not present

## 2020-08-26 DIAGNOSIS — K922 Gastrointestinal hemorrhage, unspecified: Secondary | ICD-10-CM | POA: Diagnosis not present

## 2020-08-26 LAB — CBC
HCT: 30.1 % — ABNORMAL LOW (ref 36.0–46.0)
Hemoglobin: 9.1 g/dL — ABNORMAL LOW (ref 12.0–15.0)
MCH: 26.5 pg (ref 26.0–34.0)
MCHC: 30.2 g/dL (ref 30.0–36.0)
MCV: 87.5 fL (ref 80.0–100.0)
Platelets: 417 10*3/uL — ABNORMAL HIGH (ref 150–400)
RBC: 3.44 MIL/uL — ABNORMAL LOW (ref 3.87–5.11)
RDW: 18.7 % — ABNORMAL HIGH (ref 11.5–15.5)
WBC: 6.7 10*3/uL (ref 4.0–10.5)
nRBC: 0 % (ref 0.0–0.2)

## 2020-08-26 LAB — HEMOGLOBIN AND HEMATOCRIT, BLOOD
HCT: 30.1 % — ABNORMAL LOW (ref 36.0–46.0)
HCT: 29.7 % — ABNORMAL LOW (ref 36.0–46.0)
HCT: 28.8 % — ABNORMAL LOW (ref 36.0–46.0)
HCT: 30.4 % — ABNORMAL LOW (ref 36.0–46.0)
Hemoglobin: 9 g/dL — ABNORMAL LOW (ref 12.0–15.0)
Hemoglobin: 9 g/dL — ABNORMAL LOW (ref 12.0–15.0)
Hemoglobin: 8.8 g/dL — ABNORMAL LOW (ref 12.0–15.0)
Hemoglobin: 9.4 g/dL — ABNORMAL LOW (ref 12.0–15.0)

## 2020-08-26 MED ORDER — PEG-KCL-NACL-NASULF-NA ASC-C 100 G PO SOLR
0.5000 | Freq: Two times a day (BID) | ORAL | Status: AC
  Administered 2020-08-26 (×2): 100 g via ORAL
  Filled 2020-08-26: qty 1

## 2020-08-26 MED ORDER — ONDANSETRON HCL 4 MG/2ML IJ SOLN
4.0000 mg | Freq: Once | INTRAMUSCULAR | Status: AC
  Administered 2020-08-26: 4 mg via INTRAVENOUS
  Filled 2020-08-26: qty 2

## 2020-08-26 MED ORDER — PROMETHAZINE HCL 25 MG/ML IJ SOLN
12.5000 mg | Freq: Four times a day (QID) | INTRAMUSCULAR | Status: DC | PRN
  Administered 2020-08-26: 25 mg via INTRAVENOUS
  Filled 2020-08-26: qty 1

## 2020-08-26 NOTE — Progress Notes (Signed)
PROGRESS NOTE    [name]  ZPH:[id number] DOB: November 16, 1960 DOA: 08/23/2020 PCP: Bernerd Limbo, MD   Brief Narrative: [name] is a 59 y.o. female with medical history significant of CHF, COPD, partial colectomy. Patient presented secondary rectal bleeding with concern for GI bleeding. Gastroenterology consulted on admission.   Assessment & Plan:   Principal Problem:   GIB (gastrointestinal bleeding) Active Problems:   Anxiety state   Essential hypertension   CKD (chronic kidney disease) stage 3, GFR 30-59 ml/min (HCC)   Acute blood loss anemia   Hematochezia   Diverticulosis of colon without hemorrhage   Pain of upper abdomen   GI bleeding Acute blood loss anemia Gastroenterology consulted on admission. 1 unit of PRBC transfused on 11/24 for a hemoglobin of 6.8. Hemoglobin currently stable. Patient reports normal appearing stool with last bowel movement. 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 Initial concern for possible constipation for which she was prescribed Linzess as an outpatient. -Continue analgesics prn  CKD stage IIIb Baseline creatinine of about 1.8-1.9. Stable.  Hyperlipidemia -Continue Zetia and Lipitor  Anxiety -Continue Effexor XR and Xanax as needed  COPD Stable. -Continue Dulera  Essential hypertension Patient is on Imdur, atenolol, amlodipine -Continue Imdur, amlodipine, atenolol  Chronic diastolic heart failure Stable.  GERD -Continue Protonix  PAD Patient is on aspirin, Plavix.  He has been held on admission secondary to GI bleeding. -GI recommendations for reinitiation of aspirin and Plavix treatment.   DVT prophylaxis: SCDs Code Status:   Code Status: Full Code Family Communication: None at  bedside Disposition Plan: Discharge likely in 24-48 hours pending GI workup/recommendations   Consultants:   Gastroenterology  Procedures:   VIDEO CAPSULE ENDOSCOPY (11/24>>11/25) Findings: 1) Complete study with mostly adequate prep. Some areas of reduced visualization in the distal small bowel 2) Small, non-bleeding gastric erosion vs focal gastritis 3) Small AVM in proximal duodenum, located immediately after pylorus 4) Small non-bleeding AVM in mid small bowel, located about 1 hr 10 minutes beyond pylorus (about 2 hr 42 minutes from cecum) 5) Small non-bleeding possible AVM in proximal colon 6) Areas of either blood vs particulate matter in proximal colon without active bleeding  Impression: 1)2 small, non-bleeding AVMs scattered in the very proximal colon x1 and mid small bowel x1 2) Small, non-bleeding AVM in proximal colon along with either old blood or just red particulate matter in the colon 3) Small area of focal gastritis vs gastric erosion, not likely of clinical consequence.   The proximal most small bowel AVM is certainly within reach of endoscope, and the 2nd mid small bowel is potentially within reach of enteroscopy.   Similarly, the small, non-bleeding AVM in the proximal colon can be evaluated and treated with colonoscopy.   Recommendations: - Plan for clears on 11/26 with bowel prep in the evening for tentative plan of push enteroscopy and colonoscopy on 11/27 following Plavix washout - Continue serial H/H checks - Will discuss results with patient in person on 11/26 with full formulation of plan   Antimicrobials:  None    Subjective: No issues overnight.  Objective: Vitals:   08/25/20 2011 08/25/20 2100 08/26/20 0654 08/26/20 0801  BP:  (!) 148/90 (!) 146/67   Pulse:  (!) 58 60   Resp:  18 18   Temp:  98.7 F (37.1 C) 98.7  F (37.1 C)   TempSrc:  Oral Oral   SpO2: 94% 98% 95% 95%  Weight:      Height:        Intake/Output Summary (Last 24  hours) at 08/26/2020 1030 Last data filed at 08/26/2020 1015 Gross per 24 hour  Intake 1200 ml  Output 0 ml  Net 1200 ml   Filed Weights   08/23/20 1038  Weight: 44.9 kg    Examination:  General exam: Appears calm and comfortable Respiratory system: Clear to auscultation. Respiratory effort normal. Cardiovascular system: S1 & S2 heard, RRR. No murmurs, rubs, gallops or clicks. Gastrointestinal system: Abdomen is nondistended, soft and nontender. No organomegaly or masses felt. Normal bowel sounds heard. Central nervous system: Alert and oriented. No focal neurological deficits. Musculoskeletal: No edema. No calf tenderness Skin: No cyanosis. No rashes Psychiatry: Judgement and insight appear normal. Mood & affect appropriate.     Data Reviewed: I have personally reviewed following labs and imaging studies  CBC Lab Results  Component Value Date   WBC 6.7 08/26/2020   RBC 3.44 (L) 08/26/2020   HGB 9.0 (L) 08/26/2020   HCT 29.7 (L) 08/26/2020   MCV 87.5 08/26/2020   MCH 26.5 08/26/2020   PLT 417 (H) 08/26/2020   MCHC 30.2 08/26/2020   RDW 18.7 (H) 08/26/2020   LYMPHSABS 1.3 08/23/2020   MONOABS 0.4 08/23/2020   EOSABS 0.2 08/23/2020   BASOSABS 0.1 02/77/4128     Last metabolic panel Lab Results  Component Value Date   NA 136 08/25/2020   K 3.7 08/25/2020   CL 102 08/25/2020   CO2 24 08/25/2020   BUN 14 08/25/2020   CREATININE 1.80 (H) 08/25/2020   GLUCOSE 100 (H) 08/25/2020   GFRNONAA 32 (L) 08/25/2020   GFRAA 25 (L) 06/24/2020   CALCIUM 8.9 08/25/2020   PROT 5.9 (L) 08/24/2020   ALBUMIN 3.0 (L) 08/24/2020   BILITOT 0.4 08/24/2020   ALKPHOS 80 08/24/2020   AST 20 08/24/2020   ALT 11 08/24/2020   ANIONGAP 10 08/25/2020    CBG (last 3)  No results for input(s): GLUCAP in the last 72 hours.   GFR: Estimated Creatinine Clearance: 24.4 mL/min (A) (by C-G formula based on SCr of 1.8 mg/dL (H)).  Coagulation Profile: Recent Labs  Lab 08/23/20 1048  08/24/20 0407  INR 0.9 1.0    Recent Results (from the past 240 hour(s))  Resp Panel by RT-PCR (Flu A&B, Covid) Nasopharyngeal Swab     Status: None   Collection Time: 08/23/20 11:55 AM   Specimen: Nasopharyngeal Swab; Nasopharyngeal(NP) swabs in vial transport medium  Result Value Ref Range Status   SARS Coronavirus 2 by RT PCR NEGATIVE NEGATIVE Final    Comment: (NOTE) SARS-CoV-2 target nucleic acids are NOT DETECTED.  The SARS-CoV-2 RNA is generally detectable in upper respiratory specimens during the acute phase of infection. The lowest concentration of SARS-CoV-2 viral copies this assay can detect is 138 copies/mL. A negative result does not preclude SARS-Cov-2 infection and should not be used as the sole basis for treatment or other patient management decisions. A negative result may occur with  improper specimen collection/handling, submission of specimen other than nasopharyngeal swab, presence of viral mutation(s) within the areas targeted by this assay, and inadequate number of viral copies(<138 copies/mL). A negative result must be combined with clinical observations, patient history, and epidemiological information. The expected result is Negative.  Fact Sheet for Patients:  EntrepreneurPulse.com.au  Fact Sheet for Healthcare Providers:  IncredibleEmployment.be  This test is no t yet approved or cleared by the [place] and  has been authorized for detection and/or diagnosis of SARS-CoV-2 by FDA under an Emergency Use Authorization (EUA). This EUA will remain  in effect (meaning this test can be used) for the duration of the COVID-19 declaration under Section 564(b)(1) of the Act, 21 U.S.C.section 360bbb-3(b)(1), unless the authorization is terminated  or revoked sooner.       Influenza A by PCR NEGATIVE NEGATIVE Final   Influenza B by PCR NEGATIVE NEGATIVE Final    Comment: (NOTE) The Xpert Xpress SARS-CoV-2/FLU/RSV  plus assay is intended as an aid in the diagnosis of influenza from Nasopharyngeal swab specimens and should not be used as a sole basis for treatment. Nasal washings and aspirates are unacceptable for Xpert Xpress SARS-CoV-2/FLU/RSV testing.  Fact Sheet for Patients: EntrepreneurPulse.com.au  Fact Sheet for Healthcare Providers: IncredibleEmployment.be  This test is not yet approved or cleared by the [place] FDA and has been authorized for detection and/or diagnosis of SARS-CoV-2 by FDA under an Emergency Use Authorization (EUA). This EUA will remain in effect (meaning this test can be used) for the duration of the COVID-19 declaration under Section 564(b)(1) of the Act, 21 U.S.C. section 360bbb-3(b)(1), unless the authorization is terminated or revoked.  Performed at [facility], [place] [address], [place], [place] [address]         Radiology Studies: No results found.      Scheduled Meds: . amLODipine  10 mg Oral Daily  . atenolol  25 mg Oral Daily  . atorvastatin  80 mg Oral Daily  . ezetimibe  10 mg Oral Daily  . isosorbide mononitrate  60 mg Oral Daily  . mometasone-formoterol  2 puff Inhalation BID  . ondansetron (ZOFRAN) IV  4 mg Intravenous Once  . ondansetron (ZOFRAN) IV  4 mg Intravenous Once  . pantoprazole (PROTONIX) IV  40 mg Intravenous BID  . peg 3350 powder  0.5 kit Oral BID  . venlafaxine XR  300 mg Oral Q breakfast   Continuous Infusions:   LOS: 2 days     Cordelia Poche, MD [facility] 08/26/2020, 10:30 AM  If 7PM-7AM, please contact night-coverage [email or web address]

## 2020-08-26 NOTE — Progress Notes (Signed)
Gastroenterology Progress Note  CC:  Hematochezia, anemia   Subjective: She tolerated a small amount of solid food yesterday evening. Some nausea. No vomiting. She continues to have mild generalized abdominal pain, no severe pain. She passed a small brown BM yesterday then later passed 2 small blood clots. No BM yet this am. No further rectal bleeding/clots this am.   Objective:   Video capsule endoscopy: 1)2 small, non-bleeding AVMs scattered in the very proximal colon x1 and mid small bowel x1 2) Small, non-bleeding AVM in proximal colon along with either old blood or just red particulate matter in the colon 3) Small area of focal gastritis vs gastric erosion, not likely of clinical consequence   Vital signs in last 24 hours: Temp:  [98.6 F (37 C)-98.7 F (37.1 C)] 98.7 F (37.1 C) (11/26 0654) Pulse Rate:  [58-61] 60 (11/26 0654) Resp:  [18] 18 (11/26 0654) BP: (145-148)/(67-90) 146/67 (11/26 0654) SpO2:  [94 %-98 %] 95 % (11/26 0654) Last BM Date: 08/24/20 General:   Alert in NA. Heart: RRR, no murmur.  Pulm:  Breath sounds clear throughout.  Abdomen: Soft, nondistended. Nontender. Upper abdominal bruit. Midline abdominal scar intact.  Extremities:  Without edema. Neurologic:  Alert and  oriented x4;  grossly normal neurologically. Psych:  Alert and cooperative. Normal mood and affect.  Intake/Output from previous day: 11/25 0701 - 11/26 0700 In: 1200 [P.O.:1200] Out: 0  Intake/Output this shift: No intake/output data recorded.  Lab Results: Recent Labs    08/24/20 1148 08/24/20 1436 08/25/20 0308 08/25/20 0959 08/25/20 1526 08/25/20 2200 08/26/20 0324  WBC 6.2  --  5.2  --   --   --  6.7  HGB 8.6*   < > 8.5*  8.4*   < > 9.1* 8.6* 9.1*  9.0*  HCT 28.2*   < > 27.4*  27.4*   < > 30.2* 28.5* 30.1*  30.1*  PLT 335  --  315  --   --   --  417*   < > = values in this interval not displayed.   BMET Recent Labs    08/23/20 1048 08/24/20 0407  08/25/20 0308  NA 137 138 136  K 4.5 4.3 3.7  CL 105 104 102  CO2 24 23 24   GLUCOSE 105* 102* 100*  BUN 20 20 14   CREATININE 1.90* 2.04* 1.80*  CALCIUM 8.8* 8.6* 8.9   LFT Recent Labs    08/24/20 0407  PROT 5.9*  ALBUMIN 3.0*  AST 20  ALT 11  ALKPHOS 80  BILITOT 0.4   PT/INR Recent Labs    08/23/20 1048 08/24/20 0407  LABPROT 12.2 12.6  INR 0.9 1.0   Hepatitis Panel No results for input(s): HEPBSAG, HCVAB, HEPAIGM, HEPBIGM in the last 72 hours.  No results found.  Assessment / Plan: 45. 59 year old female with recurrent lower GI bleed and anemia.  Previously admitted to the hospital with GI bleed, s/p colonoscopy 11/7 showed a single non-bleeding AVM to the proximal ascending colon and minimal neo-sigmoid diverticulosis without evidence of active bleeding.  EGD 11/7 showed retained food, no evidence of GI bleeding. She was readmitted to [facility] 11/23 with recurrent hematochezia and RUQ/LUQ pain. Admission Hg 7.9 -> 7.4 -> 7.6 -> 6.8. Transfused 1 unit of PRBCs.  Hg stable 9.1. S/P VCE 11/25 showed small nonbleeding gastric erosions vs vocal gastritis, small AVM proximal duodenum, small nonbleeding AVM in the mid small bowel and a small nonbleeding AVM in the proximal  colon. Last dose of Plavix was on 11/22.  -Push enteroscopy and colonoscopy 11/27, benefits and risks discussed including risk with sedation, risk of bleeding, perforation and infection  -Clear liquids today then NPO after midnight -Zofran 4mg  IV at 5pm and at 10pm prior to Movi prep  -CBC and CMP in am -Further recommendations per Dr. Bryan Lemma    2. Acute on chronic anemia. -See plan in # 1  3. History of perforated diverticulitis status post colectomy in 2008  4. History of adenomatous colon polyps per colonoscopy in 2018  5. History of COPD  6. History of CAD. Normal cardiac catheterization 03/2018.   7. CKD. Cr. 1.90 -> 2.04.   8. Past abdominal aortic stent, peripheral arterial disease  on Plavix. Last dose of Plavix was at 11:30am on 11/22.       Principal Problem:   GIB (gastrointestinal bleeding) Active Problems:   Anxiety state   Essential hypertension   CKD (chronic kidney disease) stage 3, GFR 30-59 ml/min (HCC)   Acute blood loss anemia   Hematochezia   Diverticulosis of colon without hemorrhage   Pain of upper abdomen     LOS: 2 days   [name]  08/26/2020, 7:59 AM

## 2020-08-26 NOTE — H&P (View-Only) (Signed)
[facility] Progress Note  CC:  Hematochezia, anemia   Subjective: She tolerated a small amount of solid food yesterday evening. Some nausea. No vomiting. She continues to have mild generalized abdominal pain, no severe pain. She passed a small brown BM yesterday then later passed 2 small blood clots. No BM yet this am. No further rectal bleeding/clots this am.   Objective:   Video capsule endoscopy: 1)2 small, non-bleeding AVMs scattered in the very proximal colon x1 and mid small bowel x1 2) Small, non-bleeding AVM in proximal colon along with either old blood or just red particulate matter in the colon 3) Small area of focal gastritis vs gastric erosion, not likely of clinical consequence   Vital signs in last 24 hours: Temp:  [98.6 F (37 C)-98.7 F (37.1 C)] 98.7 F (37.1 C) (11/26 0654) Pulse Rate:  [58-61] 60 (11/26 0654) Resp:  [18] 18 (11/26 0654) BP: (145-148)/(67-90) 146/67 (11/26 0654) SpO2:  [94 %-98 %] 95 % (11/26 0654) Last BM Date: 08/24/20 General:   Alert in NA. Heart: RRR, no murmur.  Pulm:  Breath sounds clear throughout.  Abdomen: Soft, nondistended. Nontender. Upper abdominal bruit. Midline abdominal scar intact.  Extremities:  Without edema. Neurologic:  Alert and  oriented x4;  grossly normal neurologically. Psych:  Alert and cooperative. Normal mood and affect.  Intake/Output from previous day: 11/25 0701 - 11/26 0700 In: 1200 [P.O.:1200] Out: 0  Intake/Output this shift: No intake/output data recorded.  Lab Results: Recent Labs    08/24/20 1148 08/24/20 1436 08/25/20 0308 08/25/20 0959 08/25/20 1526 08/25/20 2200 08/26/20 0324  WBC 6.2  --  5.2  --   --   --  6.7  HGB 8.6*   < > 8.5*  8.4*   < > 9.1* 8.6* 9.1*  9.0*  HCT 28.2*   < > 27.4*  27.4*   < > 30.2* 28.5* 30.1*  30.1*  PLT 335  --  315  --   --   --  417*   < > = values in this interval not displayed.   BMET Recent Labs    08/23/20 1048 08/24/20 0407  08/25/20 0308  NA 137 138 136  K 4.5 4.3 3.7  CL 105 104 102  CO2 24 23 24   GLUCOSE 105* 102* 100*  BUN 20 20 14   CREATININE 1.90* 2.04* 1.80*  CALCIUM 8.8* 8.6* 8.9   LFT Recent Labs    08/24/20 0407  PROT 5.9*  ALBUMIN 3.0*  AST 20  ALT 11  ALKPHOS 80  BILITOT 0.4   PT/INR Recent Labs    08/23/20 1048 08/24/20 0407  LABPROT 12.2 12.6  INR 0.9 1.0   Hepatitis Panel No results for input(s): HEPBSAG, HCVAB, HEPAIGM, HEPBIGM in the last 72 hours.  No results found.  Assessment / Plan: 17. 59 year old female with recurrent lower GI bleed and anemia.  Previously admitted to the hospital with GI bleed, s/p colonoscopy 11/7 showed a single non-bleeding AVM to the proximal ascending colon and minimal neo-sigmoid diverticulosis without evidence of active bleeding.  EGD 11/7 showed retained food, no evidence of GI bleeding. She was readmitted to [facility] 11/23 with recurrent hematochezia and RUQ/LUQ pain. Admission Hg 7.9 -> 7.4 -> 7.6 -> 6.8. Transfused 1 unit of PRBCs.  Hg stable 9.1. S/P VCE 11/25 showed small nonbleeding gastric erosions vs vocal gastritis, small AVM proximal duodenum, small nonbleeding AVM in the mid small bowel and a small nonbleeding AVM in the proximal  colon. Last dose of Plavix was on 11/22.  -Push enteroscopy and colonoscopy 11/27, benefits and risks discussed including risk with sedation, risk of bleeding, perforation and infection  -Clear liquids today then NPO after midnight -Zofran 4mg  IV at 5pm and at 10pm prior to Movi prep  -CBC and CMP in am -Further recommendations per Dr. Bryan Lemma    2. Acute on chronic anemia. -See plan in # 1  3. History of perforated diverticulitis status post colectomy in 2008  4. History of adenomatous colon polyps per colonoscopy in 2018  5. History of COPD  6. History of CAD. Normal cardiac catheterization 03/2018.   7. CKD. Cr. 1.90 -> 2.04.   8. Past abdominal aortic stent, peripheral arterial disease  on Plavix. Last dose of Plavix was at 11:30am on 11/22.       Principal Problem:   GIB (gastrointestinal bleeding) Active Problems:   Anxiety state   Essential hypertension   CKD (chronic kidney disease) stage 3, GFR 30-59 ml/min (HCC)   Acute blood loss anemia   Hematochezia   Diverticulosis of colon without hemorrhage   Pain of upper abdomen     LOS: 2 days   Noralyn Pick  08/26/2020, 7:59 AM

## 2020-08-27 ENCOUNTER — Inpatient Hospital Stay (HOSPITAL_COMMUNITY): Payer: Medicare Other | Admitting: Certified Registered Nurse Anesthetist

## 2020-08-27 ENCOUNTER — Encounter (HOSPITAL_COMMUNITY): Payer: Self-pay | Admitting: Internal Medicine

## 2020-08-27 ENCOUNTER — Encounter (HOSPITAL_COMMUNITY)
Admission: EM | Disposition: A | Discharge status: Home / Self Care | Payer: Self-pay | Source: Home / Self Care | Attending: Family Medicine

## 2020-08-27 DIAGNOSIS — K635 Polyp of colon: Secondary | ICD-10-CM | POA: Diagnosis not present

## 2020-08-27 DIAGNOSIS — I10 Essential (primary) hypertension: Secondary | ICD-10-CM | POA: Diagnosis not present

## 2020-08-27 DIAGNOSIS — K922 Gastrointestinal hemorrhage, unspecified: Secondary | ICD-10-CM | POA: Diagnosis not present

## 2020-08-27 DIAGNOSIS — K31819 Angiodysplasia of stomach and duodenum without bleeding: Secondary | ICD-10-CM

## 2020-08-27 DIAGNOSIS — D62 Acute posthemorrhagic anemia: Secondary | ICD-10-CM | POA: Diagnosis not present

## 2020-08-27 DIAGNOSIS — K5520 Angiodysplasia of colon without hemorrhage: Secondary | ICD-10-CM

## 2020-08-27 DIAGNOSIS — K5521 Angiodysplasia of colon with hemorrhage: Secondary | ICD-10-CM

## 2020-08-27 HISTORY — PX: ENTEROSCOPY: SHX5533

## 2020-08-27 HISTORY — PX: HOT HEMOSTASIS: SHX5433

## 2020-08-27 HISTORY — PX: POLYPECTOMY: SHX5525

## 2020-08-27 HISTORY — PX: COLONOSCOPY WITH PROPOFOL: SHX5780

## 2020-08-27 LAB — CBC
HCT: 30.3 % — ABNORMAL LOW (ref 36.0–46.0)
Hemoglobin: 9 g/dL — ABNORMAL LOW (ref 12.0–15.0)
MCH: 26.5 pg (ref 26.0–34.0)
MCHC: 29.7 g/dL — ABNORMAL LOW (ref 30.0–36.0)
MCV: 89.1 fL (ref 80.0–100.0)
Platelets: 311 10*3/uL (ref 150–400)
RBC: 3.4 MIL/uL — ABNORMAL LOW (ref 3.87–5.11)
RDW: 18.8 % — ABNORMAL HIGH (ref 11.5–15.5)
WBC: 8.5 10*3/uL (ref 4.0–10.5)
nRBC: 0 % (ref 0.0–0.2)

## 2020-08-27 LAB — COMPREHENSIVE METABOLIC PANEL
ALT: 14 U/L (ref 0–44)
AST: 29 U/L (ref 15–41)
Albumin: 3.5 g/dL (ref 3.5–5.0)
Alkaline Phosphatase: 92 U/L (ref 38–126)
Anion gap: 14 (ref 5–15)
BUN: 13 mg/dL (ref 6–20)
CO2: 24 mmol/L (ref 22–32)
Calcium: 8.9 mg/dL (ref 8.9–10.3)
Chloride: 103 mmol/L (ref 98–111)
Creatinine, Ser: 2.02 mg/dL — ABNORMAL HIGH (ref 0.44–1.00)
GFR, Estimated: 28 mL/min — ABNORMAL LOW (ref >60)
Glucose, Bld: 87 mg/dL (ref 70–99)
Potassium: 4.6 mmol/L (ref 3.5–5.1)
Sodium: 141 mmol/L (ref 135–145)
Total Bilirubin: 0.9 mg/dL (ref 0.3–1.2)
Total Protein: 6.7 g/dL (ref 6.5–8.1)

## 2020-08-27 SURGERY — ENTEROSCOPY
Anesthesia: Monitor Anesthesia Care

## 2020-08-27 MED ORDER — PROPOFOL 10 MG/ML IV BOLUS
INTRAVENOUS | Status: DC | PRN
  Administered 2020-08-27: 30 mg via INTRAVENOUS
  Administered 2020-08-27: 40 mg via INTRAVENOUS
  Administered 2020-08-27: 50 mg via INTRAVENOUS
  Administered 2020-08-27: 30 mg via INTRAVENOUS
  Administered 2020-08-27: 50 mg via INTRAVENOUS

## 2020-08-27 MED ORDER — PROPOFOL 1000 MG/100ML IV EMUL
INTRAVENOUS | Status: AC
  Filled 2020-08-27: qty 100

## 2020-08-27 MED ORDER — CLOPIDOGREL BISULFATE 75 MG PO TABS: 75.0000 mg | ORAL_TABLET | Freq: Every day | ORAL | Status: DC

## 2020-08-27 MED ORDER — OXYCODONE-ACETAMINOPHEN 5-325 MG PO TABS: 1.0000 | ORAL_TABLET | Freq: Four times a day (QID) | ORAL | 0 refills | Status: AC | PRN

## 2020-08-27 MED ORDER — PROPOFOL 500 MG/50ML IV EMUL
INTRAVENOUS | Status: DC | PRN
  Administered 2020-08-27: 125 ug/kg/min via INTRAVENOUS

## 2020-08-27 MED ORDER — PANTOPRAZOLE SODIUM 40 MG PO TBEC
40.0000 mg | DELAYED_RELEASE_TABLET | Freq: Two times a day (BID) | ORAL | Status: DC
  Administered 2020-08-27: 13:00:00 40 mg via ORAL
  Filled 2020-08-27: qty 1

## 2020-08-27 MED ORDER — ASPIRIN EC 81 MG PO TBEC: 81.0000 mg | DELAYED_RELEASE_TABLET | Freq: Every day | ORAL | Status: DC

## 2020-08-27 MED ORDER — LIDOCAINE 2% (20 MG/ML) 5 ML SYRINGE
INTRAMUSCULAR | Status: DC | PRN
  Administered 2020-08-27: 60 mg via INTRAVENOUS

## 2020-08-27 MED ORDER — LABETALOL HCL 5 MG/ML IV SOLN
INTRAVENOUS | Status: DC | PRN
  Administered 2020-08-27 (×3): 5 mg via INTRAVENOUS

## 2020-08-27 MED ORDER — SODIUM CHLORIDE 0.9 % IV SOLN: INTRAVENOUS | Status: DC

## 2020-08-27 SURGICAL SUPPLY — 22 items

## 2020-08-27 NOTE — Discharge Instructions (Signed)
[employer],  You were in the hospital because of GI bleeding. You were found to have multiple blood vessels prone to bleeding that were treated. You were also found to have a colonic polyp that was sampled. Please follow-up with the GI doctor and your PCP.

## 2020-08-27 NOTE — Op Note (Signed)
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 Estimated Blood Loss:     Estimated blood loss was minimal. Procedure:                Pre-Anesthesia Assessment:                           - Prior to the procedure, a History and Physical                            was performed, and patient medications and                            allergies were reviewed. [name] tolerance of                            previous anesthesia was also reviewed. The risks                            and benefits of the procedure and the sedation                            options and risks were discussed with the patient.                            All questions were answered, and informed consent                            was obtained. Prior Anticoagulants: The patient has                            taken Plavix (clopidogrel), last dose was 5 days  prior to procedure. ASA Grade Assessment: III - A                            patient with severe systemic disease. After                            reviewing the risks and benefits, the patient was                            deemed in satisfactory condition to undergo the                            procedure.                           After  obtaining informed consent, the endoscope was                            passed under direct vision. Throughout the                            procedure, the patient's blood pressure, pulse, and                            oxygen saturations were monitored continuously. The                            [id number]-[id number] ([id number]) Olympus pediatric colonscope                            was introduced through the mouth and advanced to                            the proximal jejunum. The small bowel enteroscopy                            was accomplished without difficulty. The patient                            tolerated the procedure well. Scope In: Scope Out: Findings:      The esophagus was normal.      The stomach was normal.      A single very small angioectasia with no bleeding was found in the       duodenal bulb. Coagulation for hemostasis using argon plasma was       successful. Estimated blood loss: none.      A single angioectasia with no bleeding was found in the proximal       jejunum. Coagulation for hemostasis using argon plasma was successful.      The remainder of the visualized small bowel was otherwise normal       appearing. No heme noted. Impression:               - Normal esophagus.                           -  Normal stomach.                           - A single non-bleeding angioectasia in the                            duodenum. Treated with argon plasma coagulation                            (APC).                           - A single non-bleeding angioectasia in the                            jejunum. Treated with argon plasma coagulation                            (APC).                           - The remainder of the visualized small bowel was                            otherwise normal appearing                           - No specimens collected.                           - The AVMs on this study correlate with location                            and appearance on  recent VCE. Since there was a                            right colon AVM on recent colonoscopy and possible                            blood in the right colon on recent VCE, will                            proceed with colonoscopy today. Recommendation:           - Perform a colonoscopy today.                           - Additional recommendations pending colonoscopy. Procedure Code(s):        --- Professional ---                           [phone], Small intestinal endoscopy, enteroscopy                            beyond second portion of duodenum, not including  ileum; with control of bleeding (eg, injection,                            bipolar cautery, unipolar cautery, laser, heater                            probe, stapler, plasma coagulator) Diagnosis Code(s):        --- Professional ---                           K55.374, Angiodysplasia of stomach and duodenum                            without bleeding                           K55.20, Angiodysplasia of colon without hemorrhage                           D62, Acute posthemorrhagic anemia                           R93.3, Abnormal findings on diagnostic imaging of                            other parts of digestive tract CPT copyright 2019 [employer]. All rights reserved. The codes documented in this report are preliminary and upon coder review may  be revised to meet current compliance requirements. Gerrit Heck, MD 08/27/2020 10:14:58 AM Number of Addenda: 0

## 2020-08-27 NOTE — Discharge Summary (Signed)
Physician Discharge Summary  [name] PJA:[id number] DOB: October 24, 1960 DOA: 08/23/2020  PCP: Bernerd Limbo, MD  Admit date: 08/23/2020 Discharge date: 08/27/2020  Admitted From: Home Disposition: Hom  Recommendations for Outpatient Follow-up:  1. Follow up with PCP in 1 week 2. Follow up with GI 3. Please follow up on the following pending results: Colonoscopy biopsy  Home Health: None Equipment/Devices: None  Discharge Condition: Stable CODE STATUS: Full code Diet recommendation: Heart healthy   Brief/Interim Summary:  Admission HPI written by Jonnie Finner, DO   Chief Complaint: rectal bleeding  HPI: [name] is a 59 y.o. female with medical history significant of CHF, COPD, partial colectomy. Presenting with abdominal pain and rectal bleeding. She reports that she had general crampy pain for the last several days. She happened to have a GI follow up yesterday, so she spoke with them about it. It was believed that he pain was d/t to constipation and she was given some medication. She woke this morning with significant RUQ/RLQ ab pain. She felt like she needed to have a BM. When she did, she noticed bright red blood on the toilet tissue. She had an additional 5 BMs throughout the morning all with BRBPR. She became concerned and came to the ED. No alleviating or aggravating factors noted.     Hospital course:  GI bleeding Acute blood loss anemia Gastroenterology consulted on admission. 1 unit of PRBC transfused on 11/24 for a hemoglobin of 6.8. Hemoglobin currently stable. Patient reports normal appearing stool with last bowel movement. Multiple AVMs noted on VCE in proximal duodenum, small bowel, proximal colon. Small bowel enteroscopy and colonoscopy performed on 11/27 and significant for non-bleeding AVMs which were cauterized in addition to a large colonic polyp which was biopsied. Outpatient follow-up with GI.  Abdominal pain CT imagery  consistent with diarrheal illness without evidence of colitis. Initial concern for possible constipation for which she was prescribed Linzess as an outpatient.  CKD stage IIIb Baseline creatinine of about 1.8-1.9. Stable.  Hyperlipidemia Continue Zetia and Lipitor  Anxiety Continue Effexor XR and Xanax as needed  COPD Stable. Continue Dulera  Essential hypertension Patient is on Imdur, atenolol, amlodipine. Continue Imdur, amlodipine, atenolol  Chronic diastolic heart failure Stable.  GERD Continue Protonix  PAD Patient is on aspirin, Plavix.  He has been held on admission secondary to GI bleeding. Resume home regimen day after discharge.  Discharge Diagnoses:  Principal Problem:   GIB (gastrointestinal bleeding) Active Problems:   Anxiety state   Essential hypertension   CKD (chronic kidney disease) stage 3, GFR 30-59 ml/min (HCC)   Acute blood loss anemia   Hematochezia   Diverticulosis of colon without hemorrhage   Pain of upper abdomen   Polyp of ascending colon   AVM (arteriovenous malformation) of small bowel, acquired with hemorrhage    Discharge Instructions   Allergies as of 08/27/2020      Reactions   Doxycycline Anaphylaxis, Hives   Hydralazine Rash   Hydrocodone-acetaminophen Nausea And Vomiting   Iohexol Itching   Pt has itching nose after iv contrast injection      Medication List    STOP taking these medications   traMADol 50 MG tablet Commonly known as: Ultram     TAKE these medications   acetaminophen 500 MG tablet Commonly known as: TYLENOL Take 1,000 mg by mouth every 8 (eight) hours as needed for mild pain or headache.   albuterol 108 (90 Base) MCG/ACT inhaler Commonly known as:  VENTOLIN HFA Inhale 2 puffs into the lungs 2 (two) times daily as needed for wheezing or shortness of breath (asthma).   ALPRAZolam 1 MG tablet Commonly known as: XANAX Take 0.5 tablets by mouth daily as needed for anxiety.   amLODipine 10  MG tablet Commonly known as: NORVASC Take 1 tablet (10 mg total) by mouth daily.   aspirin EC 81 MG tablet Take 1 tablet (81 mg total) by mouth daily. Start taking on: August 28, 2020   atenolol 25 MG tablet Commonly known as: TENORMIN TAKE 1 TABLET BY MOUTH ONCE A DAY   atorvastatin 80 MG tablet Commonly known as: LIPITOR Take 80 mg by mouth daily.   azelastine 0.1 % nasal spray Commonly known as: ASTELIN Place 1 spray into both nostrils daily as needed for rhinitis or allergies.   clopidogrel 75 MG tablet Commonly known as: Plavix Take 1 tablet (75 mg total) by mouth daily. Start taking on: August 28, 2020   ezetimibe 10 MG tablet Commonly known as: ZETIA Take 1 tablet (10 mg total) by mouth daily.   isosorbide mononitrate 60 MG 24 hr tablet Commonly known as: IMDUR TAKE 1 TABLET BY MOUTH DAILY   linaclotide 72 MCG capsule Commonly known as: Linzess Take 1 capsule (72 mcg total) by mouth daily before breakfast.   megestrol 20 MG tablet Commonly known as: MEGACE Take 1 tablet by mouth daily.   mometasone-formoterol 100-5 MCG/ACT Aero Commonly known as: DULERA Take 2 puffs first thing in am and then another 2 puffs about 12 hours later. What changed:   how much to take  how to take this  when to take this  additional instructions   nitroGLYCERIN 0.4 MG SL tablet Commonly known as: NITROSTAT Place 1 tablet (0.4 mg total) under the tongue every 5 (five) minutes as needed for chest pain.   oxyCODONE-acetaminophen 5-325 MG tablet Commonly known as: PERCOCET/ROXICET Take 1 tablet by mouth every 6 (six) hours as needed for up to 5 days for moderate pain or severe pain.   pantoprazole 40 MG tablet Commonly known as: PROTONIX TAKE 1 TABLET BY MOUTH TWICE (2) DAILY What changed: See the new instructions.   polyethylene glycol 17 g packet Commonly known as: MiraLax Take 17 g by mouth daily.   promethazine 25 MG tablet Commonly known as:  PHENERGAN Take 1 tablet by mouth daily as needed for nausea or vomiting.   venlafaxine XR 150 MG 24 hr capsule Commonly known as: Effexor XR Take 1 capsule (150 mg total) by mouth daily with breakfast. What changed: how much to take       Follow-up Information    Bernerd Limbo, MD. Schedule an appointment as soon as possible for a visit in 1 week(s).   Specialty: Family Medicine Why: Hospital follow-up Contact information: [address] [place] [address] [phone]        Lavena Bullion, DO. Call.   Specialty: Gastroenterology Contact information: [address] [place] [address] [phone]              Allergies  Allergen Reactions  . Doxycycline Anaphylaxis and Hives  . Hydralazine Rash  . Hydrocodone-Acetaminophen Nausea And Vomiting  . Iohexol Itching    Pt has itching nose after iv contrast injection    Consultations:   GI   Procedures/Studies: CT ABDOMEN PELVIS WO CONTRAST  Result Date: 08/24/2020 CLINICAL DATA:  Abdominal pain EXAM: CT ABDOMEN AND PELVIS WITHOUT CONTRAST TECHNIQUE:  Multidetector CT imaging of the abdomen and pelvis was performed following the standard protocol without IV contrast. COMPARISON:  08/04/2020 FINDINGS: LOWER CHEST: Normal. HEPATOBILIARY: Normal hepatic contours. No intra- or extrahepatic biliary dilatation. The gallbladder is normal. PANCREAS: Normal pancreas. No ductal dilatation or peripancreatic fluid collection. SPLEEN: Normal. ADRENALS/URINARY TRACT: The adrenal glands are normal. No hydronephrosis, nephroureterolithiasis or solid renal mass. The urinary bladder is normal for degree of distention STOMACH/BOWEL: There is no hiatal hernia. Normal duodenal course and caliber. No small bowel dilatation or inflammation. The colon is largely fluid-filled. VASCULAR/LYMPHATIC: There is calcific atherosclerosis of the abdominal aorta. No lymphadenopathy. REPRODUCTIVE: Status  post hysterectomy. No adnexal mass. MUSCULOSKELETAL. No bony spinal canal stenosis or focal osseous abnormality. OTHER: None. IMPRESSION: 1. No acute abnormality of the abdomen or pelvis. 2. Fluid-filled colon, consistent with diarrheal illness. Aortic Atherosclerosis (ICD10-I70.0). Electronically Signed   By: Ulyses Jarred M.D.   On: 08/24/2020 00:38   CT Abdomen Pelvis Wo Contrast  Result Date: 08/05/2020 CLINICAL DATA:  Left abdominal pain EXAM: CT ABDOMEN AND PELVIS WITHOUT CONTRAST TECHNIQUE: Multidetector CT imaging of the abdomen and pelvis was performed following the standard protocol without IV contrast. COMPARISON:  None. FINDINGS: Lower chest: Trace left pleural effusion.  Left base atelectasis. Hepatobiliary: No focal hepatic abnormality. Gallbladder unremarkable. Pancreas: No focal abnormality or ductal dilatation. Spleen: No focal abnormality.  Normal size. Adrenals/Urinary Tract: No adrenal abnormality. No focal renal abnormality. No stones or hydronephrosis. Urinary bladder is unremarkable. Stomach/Bowel: Stomach, large and small bowel grossly unremarkable. Vascular/Lymphatic: Diffuse aortoiliac atherosclerosis. No aneurysm or adenopathy. Reproductive: Prior hysterectomy.  No adnexal masses. Other: No free fluid or free air. Musculoskeletal: No acute bony abnormality. IMPRESSION: No acute findings in the abdomen or pelvis. Diffuse vascular atherosclerosis. Trace left pleural effusion with left base atelectasis. Electronically Signed   By: Rolm Baptise M.D.   On: 08/05/2020 00:14   DG Abd Acute W/Chest  Result Date: 08/04/2020 CLINICAL DATA:  Left-sided abdominal pain EXAM: DG ABDOMEN ACUTE WITH 1 VIEW CHEST COMPARISON:  None. FINDINGS: There is no evidence of dilated bowel loops or free intraperitoneal air. No radiopaque calculi or other significant radiographic abnormality is seen. Vascular stents are seen within the mid abdomen. Heart size and mediastinal contours are within normal limits.  Both lungs are clear. IMPRESSION: Negative abdominal radiographs.  No acute cardiopulmonary disease. Electronically Signed   By: Prudencio Pair M.D.   On: 08/04/2020 22:41     SMALL BOWEL ENTEROSCOPY (08/27/2020) Impression:               - Normal esophagus.                           - Normal stomach.                           - A single non-bleeding angioectasia in the                            duodenum. Treated with argon plasma coagulation                            (APC).                           - A single non-bleeding angioectasia in the  jejunum. Treated with argon plasma coagulation                            (APC).                           - The remainder of the visualized small bowel was                            otherwise normal appearing                           - No specimens collected.                           - The AVMs on this study correlate with location                            and appearance on recent VCE. Since there was a                            right colon AVM on recent colonoscopy and possible                            blood in the right colon on recent VCE, will                            proceed with colonoscopy today. Recommendation:           - Perform a colonoscopy today.                           - Additional recommendations pending colonoscopy.  COLONOSCOPY (08/27/2020) Impression:               - Preparation of the colon was fair.                           - One 20 mm polyp in the ascending colon, removed                            with a hot snare. Resected and retrieved.                           - Stool in the entire examined colon.                           - Non-bleeding internal hemorrhoids.                           - While views were significantly limited due to                            poor bowel preparation, there was no blood noted  throughout the lumen.  Recommendation:            - Return patient to hospital ward for ongoing care.                           - Resume previous diet today.                           - Continue present medications.                           - Resume Plavix (clopidogrel) at prior dose                            tomorrow.                           - Await pathology results.                           - Repeat colonoscopy in 3 months because the bowel                            preparation was poor and for surveillance after                            piecemeal polypectomy.                           - Will arrange for outpatient follow-up and                            outpatient colonoscopy.  Subjective: Small blood with bowel prep, otherwise no bleeding.  Discharge Exam: Vitals:   08/27/20 1045 08/27/20 1308  BP: (!) 181/80 118/63  Pulse: 67 60  Resp: 15 17  Temp: 98.1 F (36.7 C) 98.2 F (36.8 C)  SpO2: 93% 96%   Vitals:   08/27/20 1010 08/27/20 1020 08/27/20 1045 08/27/20 1308  BP: (!) 196/82 (!) 199/65 (!) 181/80 118/63  Pulse: 63 67 67 60  Resp: 17 (!) 22 15 17   Temp: 97.7 F (36.5 C)  98.1 F (36.7 C) 98.2 F (36.8 C)  TempSrc: Axillary     SpO2: 100% 92% 93% 96%  Weight:      Height:        General: Pt is alert, awake, not in acute distress Cardiovascular: RRR, S1/S2 +, no rubs, no gallops Respiratory: CTA bilaterally, no wheezing, no rhonchi Abdominal: Soft, NT, ND, bowel sounds + Extremities: no edema, no cyanosis    The results of significant diagnostics from this hospitalization (including imaging, microbiology, ancillary and laboratory) are listed below for reference.     Microbiology: Recent Results (from the past 240 hour(s))  Resp Panel by RT-PCR (Flu A&B, Covid) Nasopharyngeal Swab     Status: None   Collection Time: 08/23/20 11:55 AM   Specimen: Nasopharyngeal Swab; Nasopharyngeal(NP) swabs in vial transport medium  Result Value Ref Range Status   SARS Coronavirus 2 by RT PCR NEGATIVE  NEGATIVE Final    Comment: (NOTE) SARS-CoV-2 target nucleic acids are NOT DETECTED.  The SARS-CoV-2 RNA is  generally detectable in upper respiratory specimens during the acute phase of infection. The lowest concentration of SARS-CoV-2 viral copies this assay can detect is 138 copies/mL. A negative result does not preclude SARS-Cov-2 infection and should not be used as the sole basis for treatment or other patient management decisions. A negative result may occur with  improper specimen collection/handling, submission of specimen other than nasopharyngeal swab, presence of viral mutation(s) within the areas targeted by this assay, and inadequate number of viral copies(<138 copies/mL). A negative result must be combined with clinical observations, patient history, and epidemiological information. The expected result is Negative.  Fact Sheet for Patients:  EntrepreneurPulse.com.au  Fact Sheet for Healthcare Providers:  IncredibleEmployment.be  This test is no t yet approved or cleared by the [place] FDA and  has been authorized for detection and/or diagnosis of SARS-CoV-2 by FDA under an Emergency Use Authorization (EUA). This EUA will remain  in effect (meaning this test can be used) for the duration of the COVID-19 declaration under Section 564(b)(1) of the Act, 21 U.S.C.section 360bbb-3(b)(1), unless the authorization is terminated  or revoked sooner.       Influenza A by PCR NEGATIVE NEGATIVE Final   Influenza B by PCR NEGATIVE NEGATIVE Final    Comment: (NOTE) The Xpert Xpress SARS-CoV-2/FLU/RSV plus assay is intended as an aid in the diagnosis of influenza from Nasopharyngeal swab specimens and should not be used as a sole basis for treatment. Nasal washings and aspirates are unacceptable for Xpert Xpress SARS-CoV-2/FLU/RSV testing.  Fact Sheet for Patients: EntrepreneurPulse.com.au  Fact Sheet for Healthcare  Providers: IncredibleEmployment.be  This test is not yet approved or cleared by the [place] FDA and has been authorized for detection and/or diagnosis of SARS-CoV-2 by FDA under an Emergency Use Authorization (EUA). This EUA will remain in effect (meaning this test can be used) for the duration of the COVID-19 declaration under Section 564(b)(1) of the Act, 21 U.S.C. section 360bbb-3(b)(1), unless the authorization is terminated or revoked.  Performed at [facility], [place] [address] [place], [place] [address]      Labs: BNP (last 3 results) No results for input(s): BNP in the last 8760 hours. Basic Metabolic Panel: Recent Labs  Lab 08/23/20 1048 08/24/20 0407 08/25/20 0308 08/27/20 0315  NA 137 138 136 141  K 4.5 4.3 3.7 4.6  CL 105 104 102 103  CO2 24 23 24 24   GLUCOSE 105* 102* 100* 87  BUN 20 20 14 13   CREATININE 1.90* 2.04* 1.80* 2.02*  CALCIUM 8.8* 8.6* 8.9 8.9   Liver Function Tests: Recent Labs  Lab 08/23/20 1048 08/24/20 0407 08/27/20 0315  AST 21 20 29   ALT 12 11 14   ALKPHOS 96 80 92  BILITOT 0.5 0.4 0.9  PROT 6.7 5.9* 6.7  ALBUMIN 3.3* 3.0* 3.5   Recent Labs  Lab 08/23/20 1611  LIPASE 42  AMYLASE 81   No results for input(s): AMMONIA in the last 168 hours. CBC: Recent Labs  Lab 08/22/20 1358 08/22/20 1358 08/23/20 1048 08/23/20 1611 08/24/20 1148 08/24/20 1436 08/25/20 0308 08/25/20 0959 08/26/20 0324 08/26/20 0851 08/26/20 1602 08/26/20 2137 08/27/20 0315  WBC 7.4   < > 6.8  --  6.2  --  5.2  --  6.7  --   --   --  8.5  NEUTROABS 4.5  --  4.8  --   --   --   --   --   --   --   --   --   --  HGB 8.8 Repeated and verified X2.*   < > 7.9*   < > 8.6*   < > 8.5*  8.4*   < > 9.1*  9.0* 9.0* 8.8* 9.4* 9.0*  HCT 27.2*   < > 26.3*   < > 28.2*   < > 27.4*  27.4*   < > 30.1*  30.1* 29.7* 28.8* 30.4* 30.3*  MCV 80.6   < > 85.9  --  87.6  --  85.4  --  87.5  --   --   --  89.1  PLT 560.0*   < >  408*  --  335  --  315  --  417*  --   --   --  311   < > = values in this interval not displayed.   Cardiac Enzymes: No results for input(s): CKTOTAL, CKMB, CKMBINDEX, TROPONINI in the last 168 hours. BNP: Invalid input(s): POCBNP CBG: No results for input(s): GLUCAP in the last 168 hours. D-Dimer No results for input(s): DDIMER in the last 72 hours. Hgb A1c No results for input(s): HGBA1C in the last 72 hours. Lipid Profile No results for input(s): CHOL, HDL, LDLCALC, TRIG, CHOLHDL, LDLDIRECT in the last 72 hours. Thyroid function studies No results for input(s): TSH, T4TOTAL, T3FREE, THYROIDAB in the last 72 hours.  Invalid input(s): FREET3 Anemia work up No results for input(s): VITAMINB12, FOLATE, FERRITIN, TIBC, IRON, RETICCTPCT in the last 72 hours. Urinalysis    Component Value Date/Time   COLORURINE YELLOW 08/05/2020 0640   APPEARANCEUR CLEAR 08/05/2020 0640   APPEARANCEUR Hazy 06/07/2012 1350   LABSPEC 1.014 08/05/2020 0640   LABSPEC 1.016 06/07/2012 1350   PHURINE 5.0 08/05/2020 0640   GLUCOSEU NEGATIVE 08/05/2020 0640   GLUCOSEU Negative 06/07/2012 1350   HGBUR NEGATIVE 08/05/2020 0640   BILIRUBINUR NEGATIVE 08/05/2020 0640   BILIRUBINUR Negative 06/07/2012 1350   KETONESUR NEGATIVE 08/05/2020 0640   PROTEINUR >=300 (A) 08/05/2020 0640   UROBILINOGEN 0.2 06/28/2014 1518   NITRITE NEGATIVE 08/05/2020 0640   LEUKOCYTESUR NEGATIVE 08/05/2020 0640   LEUKOCYTESUR Negative 06/07/2012 1350   Sepsis Labs Invalid input(s): PROCALCITONIN,  WBC,  LACTICIDVEN Microbiology Recent Results (from the past 240 hour(s))  Resp Panel by RT-PCR (Flu A&B, Covid) Nasopharyngeal Swab     Status: None   Collection Time: 08/23/20 11:55 AM   Specimen: Nasopharyngeal Swab; Nasopharyngeal(NP) swabs in vial transport medium  Result Value Ref Range Status   SARS Coronavirus 2 by RT PCR NEGATIVE NEGATIVE Final    Comment: (NOTE) SARS-CoV-2 target nucleic acids are NOT DETECTED.  The  SARS-CoV-2 RNA is generally detectable in upper respiratory specimens during the acute phase of infection. The lowest concentration of SARS-CoV-2 viral copies this assay can detect is 138 copies/mL. A negative result does not preclude SARS-Cov-2 infection and should not be used as the sole basis for treatment or other patient management decisions. A negative result may occur with  improper specimen collection/handling, submission of specimen other than nasopharyngeal swab, presence of viral mutation(s) within the areas targeted by this assay, and inadequate number of viral copies(<138 copies/mL). A negative result must be combined with clinical observations, patient history, and epidemiological information. The expected result is Negative.  Fact Sheet for Patients:  EntrepreneurPulse.com.au  Fact Sheet for Healthcare Providers:  IncredibleEmployment.be  This test is no t yet approved or cleared by the [place] FDA and  has been authorized for detection and/or diagnosis of SARS-CoV-2 by FDA under an Emergency Use Authorization (EUA). This EUA  will remain  in effect (meaning this test can be used) for the duration of the COVID-19 declaration under Section 564(b)(1) of the Act, 21 U.S.C.section 360bbb-3(b)(1), unless the authorization is terminated  or revoked sooner.       Influenza A by PCR NEGATIVE NEGATIVE Final   Influenza B by PCR NEGATIVE NEGATIVE Final    Comment: (NOTE) The Xpert Xpress SARS-CoV-2/FLU/RSV plus assay is intended as an aid in the diagnosis of influenza from Nasopharyngeal swab specimens and should not be used as a sole basis for treatment. Nasal washings and aspirates are unacceptable for Xpert Xpress SARS-CoV-2/FLU/RSV testing.  Fact Sheet for Patients: EntrepreneurPulse.com.au  Fact Sheet for Healthcare Providers: IncredibleEmployment.be  This test is not yet approved or  cleared by the [place] FDA and has been authorized for detection and/or diagnosis of SARS-CoV-2 by FDA under an Emergency Use Authorization (EUA). This EUA will remain in effect (meaning this test can be used) for the duration of the COVID-19 declaration under Section 564(b)(1) of the Act, 21 U.S.C. section 360bbb-3(b)(1), unless the authorization is terminated or revoked.  Performed at [facility], [place] [address] [place], [place] [address]      Time coordinating discharge: 35 minutes  SIGNED:   Cordelia Poche, MD [facility] 08/27/2020, 1:29 PM

## 2020-08-27 NOTE — Plan of Care (Signed)
Instructions were reviewed with patient. All questions were answered. Patient was transported to main entrance by wheelchair. ° °

## 2020-08-27 NOTE — Op Note (Signed)
[facility] Patient Name: [name] Procedure Date: 08/27/2020 MRN: [id number] Attending MD: Gerrit Heck , MD Date of Birth: 06/12/1961 CSN: [id number] Age: 59 Admit Type: Inpatient Procedure:                Colonoscopy Indications:              Hematochezia, Acute post hemorrhagic anemia,                            Abnormal video capsule endoscopy, History of right                            colon AVM treated with APC earlier this month Providers:                Gerrit Heck, MD, Grace Isaac, RN, Elspeth Cho Tech., Technician, Marla Roe, CRNA Referring MD:              Medicines:                Monitored Anesthesia Care Complications:            No immediate complications. Estimated Blood Loss:     Estimated blood loss: none. Procedure:                Pre-Anesthesia Assessment:                           - Prior to the procedure, a History and Physical                            was performed, and patient medications and                            allergies were reviewed. [name] tolerance of                            previous anesthesia was also reviewed. The risks                            and benefits of the procedure and the sedation                            options and risks were discussed with the patient.                            All questions were answered, and informed consent                            was obtained. Prior Anticoagulants: The patient has                            taken Plavix (clopidogrel), last dose was 5 days  prior to procedure. ASA Grade Assessment: III - A                            patient with severe systemic disease. After                            reviewing the risks and benefits, the patient was                            deemed in satisfactory condition to undergo the                            procedure.                           - Prior to the  procedure, a History and Physical                            was performed, and patient medications and                            allergies were reviewed. [name] tolerance of                            previous anesthesia was also reviewed. The risks                            and benefits of the procedure and the sedation                            options and risks were discussed with the patient.                            All questions were answered, and informed consent                            was obtained. Prior Anticoagulants: The patient has                            taken Plavix (clopidogrel), last dose was 5 days                            prior to procedure. ASA Grade Assessment: III - A                            patient with severe systemic disease. After                            reviewing the risks and benefits, the patient was                            deemed in satisfactory condition to undergo the  procedure.                           After obtaining informed consent, the colonoscope                            was passed under direct vision. Throughout the                            procedure, the patient's blood pressure, pulse, and                            oxygen saturations were monitored continuously. The                            PCF-H190DL ([id number] Olympus pediatric colonscope                            was introduced through the anus and advanced to the                            the cecum, identified by appendiceal orifice and                            ileocecal valve. The colonoscopy was performed                            without difficulty. The patient tolerated the                            procedure well. The quality of the bowel                            preparation was fair. The ileocecal valve,                            appendiceal orifice, and rectum were photographed. Scope In: 9:40:31 AM Scope Out:  10:02:38 AM Scope Withdrawal Time: 0 hours 16 minutes 24 seconds  Total Procedure Duration: 0 hours 22 minutes 7 seconds  Findings:      The perianal and digital rectal examinations were normal.      A 20 mm polyp was found in the ascending colon. The polyp was sessile.       The polyp was removed with a hot snare. Resection and retrieval were       complete via piecemeal technique. Estimated blood loss: none.      A moderate amount of semi-solid stool was found in the entire colon,       interfering with visualization. Lavage of the area was performed using       copious amounts of tap water, resulting in incomplete clearance with       fair visualization. Solid debris clogged the endoscope several times       during this study, which limited ability to clear the mucosal surface.       Otherwise, no blood noted throughout the colon lumen. The previously       seen  right colon AVM was not seen on this study. Due to significant       stool burden in the left colon, the previously noted surgical       anastamosis was also not seen on this study.      Non-bleeding internal hemorrhoids were found during retroflexion. The       hemorrhoids were small. Impression:               - Preparation of the colon was fair.                           - One 20 mm polyp in the ascending colon, removed                            with a hot snare. Resected and retrieved.                           - Stool in the entire examined colon.                           - Non-bleeding internal hemorrhoids.                           - While views were significantly limited due to                            poor bowel preparation, there was no blood noted                            throughout the lumen. Moderate Sedation:      Not Applicable - Patient had care per Anesthesia. Recommendation:           - Return patient to hospital ward for ongoing care.                           - Resume previous diet today.                            - Continue present medications.                           - Resume Plavix (clopidogrel) at prior dose                            tomorrow.                           - Await pathology results.                           - Repeat colonoscopy in 3 months because the bowel                            preparation was poor and for surveillance after  piecemeal polypectomy.                           - Will arrange for outpatient follow-up and                            outpatient colonoscopy. Procedure Code(s):        --- Professional ---                           [phone], Colonoscopy, flexible; with removal of                            tumor(s), polyp(s), or other lesion(s) by snare                            technique Diagnosis Code(s):        --- Professional ---                           K64.8, Other hemorrhoids                           K63.5, Polyp of colon                           K92.1, Melena (includes Hematochezia)                           D62, Acute posthemorrhagic anemia                           R93.3, Abnormal findings on diagnostic imaging of                            other parts of digestive tract CPT copyright 2019 [employer]. All rights reserved. The codes documented in this report are preliminary and upon coder review may  be revised to meet current compliance requirements. Gerrit Heck, MD 08/27/2020 10:27:40 AM Number of Addenda: 0

## 2020-08-27 NOTE — Transfer of Care (Signed)
Immediate Anesthesia Transfer of Care Note  Patient: Samson Frederic Mcduffie  Procedure(s) Performed: ENTEROSCOPY (N/A ) COLONOSCOPY WITH PROPOFOL (N/A ) HOT HEMOSTASIS (ARGON PLASMA COAGULATION/BICAP) (N/A ) POLYPECTOMY  Patient Location: PACU  Anesthesia Type:MAC  Level of Consciousness: awake, alert  and oriented  Airway & Oxygen Therapy: Patient Spontanous Breathing and Patient connected to face mask oxygen  Post-op Assessment: Report given to RN and Post -op Vital signs reviewed and stable  Post vital signs: Reviewed and stable  Last Vitals:  Vitals Value Taken Time  BP    Temp    Pulse    Resp 20 08/27/20 1008  SpO2    Vitals shown include unvalidated device data.  Last Pain:  Vitals:   08/27/20 0800  TempSrc: Oral  PainSc: 0-No pain      Patients Stated Pain Goal: 2 (19/41/74 0814)  Complications: No complications documented.

## 2020-08-27 NOTE — Interval H&P Note (Signed)
History and Physical Interval Note:  08/27/2020 8:28 AM  [name]  has presented today for surgery, with the diagnosis of Hematochezia. Small bowel and colon AVMs..  The various methods of treatment have been discussed with the patient and family. After consideration of risks, benefits and other options for treatment, the patient has consented to  Procedure(s) with comments: ENTEROSCOPY (N/A) - Push enteroscopy  COLONOSCOPY WITH PROPOFOL (N/A) as a surgical intervention.  [name] history has been reviewed, patient examined, no change in status, stable for surgery.  I have reviewed the patient's chart and labs.  Questions were answered to the patient's satisfaction.     Dominic Pea Antonious Omahoney

## 2020-08-27 NOTE — Anesthesia Preprocedure Evaluation (Signed)
Anesthesia Evaluation  Patient identified by MRN, date of birth, ID band Patient awake    Reviewed: Allergy & Precautions, NPO status , Patient's Chart, lab work & pertinent test results, reviewed documented beta blocker date and time   Airway Mallampati: II  TM Distance: >3 FB Neck ROM: Full    Dental  (+) Edentulous Upper, Edentulous Lower   Pulmonary shortness of breath, asthma , COPD,  COPD inhaler, Current Smoker and Patient abstained from smoking.,    Pulmonary exam normal breath sounds clear to auscultation       Cardiovascular hypertension, Pt. on medications and Pt. on home beta blockers (-) angina+ CAD, + Past MI, + Peripheral Vascular Disease and +CHF  + Valvular Problems/Murmurs AS  Rhythm:Regular Rate:Normal + Systolic murmurs    Neuro/Psych  Headaches, PSYCHIATRIC DISORDERS Anxiety Depression Bipolar Disorder Schizophrenia    GI/Hepatic Neg liver ROS, GERD  Medicated,  Endo/Other  negative endocrine ROS  Renal/GU Renal InsufficiencyRenal disease (Renal stents)     Musculoskeletal negative musculoskeletal ROS (+)   Abdominal   Peds  Hematology  (+) Blood dyscrasia (PLAVIX), anemia ,   Anesthesia Other Findings Day of surgery medications reviewed with the patient.  Reproductive/Obstetrics                             Anesthesia Physical  Anesthesia Plan  ASA: III  Anesthesia Plan: MAC   Post-op Pain Management:    Induction: Intravenous  PONV Risk Score and Plan: 1 and Propofol infusion and Treatment may vary due to age or medical condition  Airway Management Planned: Nasal Cannula and Natural Airway  Additional Equipment:   Intra-op Plan:   Post-operative Plan:   Informed Consent: I have reviewed the patients History and Physical, chart, labs and discussed the procedure including the risks, benefits and alternatives for the proposed anesthesia with the patient or  authorized representative who has indicated his/her understanding and acceptance.       Plan Discussed with: CRNA and Anesthesiologist  Anesthesia Plan Comments:         Anesthesia Quick Evaluation

## 2020-08-27 NOTE — Anesthesia Procedure Notes (Signed)
Date/Time: 08/27/2020 9:01 AM Performed by: Talbot Grumbling, CRNA Oxygen Delivery Method: Simple face mask

## 2020-08-27 NOTE — Anesthesia Postprocedure Evaluation (Signed)
Anesthesia Post Note  Patient: [name]  Procedure(s) Performed: ENTEROSCOPY (N/A ) COLONOSCOPY WITH PROPOFOL (N/A ) HOT HEMOSTASIS (ARGON PLASMA COAGULATION/BICAP) (N/A ) POLYPECTOMY     Patient location during evaluation: Endoscopy Anesthesia Type: MAC Level of consciousness: awake and alert Pain management: pain level controlled Vital Signs Assessment: post-procedure vital signs reviewed and stable Respiratory status: spontaneous breathing, nonlabored ventilation and respiratory function stable Cardiovascular status: blood pressure returned to baseline and stable Postop Assessment: no apparent nausea or vomiting Anesthetic complications: no   No complications documented.  Last Vitals:  Vitals:   08/27/20 1010 08/27/20 1020  BP: (!) 196/82 (!) 199/65  Pulse: 63 67  Resp: 17 (!) 22  Temp: 36.5 C   SpO2: 100% 92%    Last Pain:  Vitals:   08/27/20 1020  TempSrc:   PainSc: 0-No pain                 Lynda Rainwater

## 2020-08-29 ENCOUNTER — Encounter (HOSPITAL_COMMUNITY): Payer: Self-pay | Admitting: Gastroenterology

## 2020-08-30 ENCOUNTER — Encounter: Payer: Self-pay | Admitting: Gastroenterology

## 2020-08-30 LAB — SURGICAL PATHOLOGY

## 2020-09-06 ENCOUNTER — Encounter: Payer: Self-pay | Admitting: Cardiology

## 2020-09-06 ENCOUNTER — Other Ambulatory Visit: Payer: Self-pay

## 2020-09-06 ENCOUNTER — Ambulatory Visit: Payer: Medicare Other | Admitting: Cardiology

## 2020-09-06 VITALS — BP 148/74 | HR 66 | Resp 16 | Ht 60.25 in | Wt 98.8 lb

## 2020-09-06 DIAGNOSIS — I10 Essential (primary) hypertension: Secondary | ICD-10-CM

## 2020-09-06 DIAGNOSIS — N184 Chronic kidney disease, stage 4 (severe): Secondary | ICD-10-CM

## 2020-09-06 DIAGNOSIS — K922 Gastrointestinal hemorrhage, unspecified: Secondary | ICD-10-CM

## 2020-09-06 DIAGNOSIS — I701 Atherosclerosis of renal artery: Secondary | ICD-10-CM

## 2020-09-06 MED ORDER — ATENOLOL 50 MG PO TABS: 50.0000 mg | ORAL_TABLET | Freq: Every day | ORAL | 1 refills | Status: DC

## 2020-09-06 NOTE — Progress Notes (Signed)
Primary Physician/Referring:  Bernerd Limbo, MD  [name], female    DOB: 05/05/1961, 59 y.o.   MRN: [id number]  Chief Complaint  Patient presents with  . Hypertension  . Shortness of Breath  . Follow-up    2 week   HPI:    [name]  is a 59 y.o. Caucasian female  with hypertension, COPD, diastolic CHF, ongoing tobacco abuse (2 cig a day), and difficult to control hypertension with CKD, with bilateral renal artery angioplasty, PAD with distal abdominal aorta stenosis S/P stenting with 10x 29 mm Omnilink Elite stent. She underwent coronary angiogram in June 2019 for nitroglycerin responsive chest pain that revealed normal coronary arteries.   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  She underwent a partially successful balloon angioplasty with antegrade dissection noted in the renal artery and hopefully this has remained patent.  Patient was admitted with lower GI bleed on 08/04/2020 requiring 2 units of packed RBCs and again on 08/23/2020 and found to have colonic polyposis.  She has follow-up appointments with gastroenterology.  States that she feels extremely tired and fatigued since hospitalization.  She has not had any further GI bleed or dark stools.  States that her blood pressure has been controlled.  She has occasionally noticed some palpitations.  She has reduced her smoking cigarettes to occasional cigarettes.  Past Medical History:  Diagnosis Date  . Anemia 2008  . Anxiety   . Aortic stenosis   . Arthritis    "all my joints ache" (09/07/2014)  . Asthma   . Bipolar 1 disorder ([place])   . Bradycardia   . Bruit   . Carpal tunnel syndrome   . Cervical cancer ([place]) 1985  . CHF (congestive heart failure) ([place])   . Chronic kidney disease (CKD), stage V ([place])   . COPD (chronic obstructive pulmonary disease) ([place])  2000  . Coronary artery disease   . Daily headache   . Depression 2000  . Diverticulitis 2008  . GERD (gastroesophageal reflux disease)   . Glaucoma   . Heart murmur   . History of colon polyps 2009  . HLD (hyperlipidemia) 2013  . Hypertension 2013  . Hypovitaminosis D   . IBS (irritable bowel syndrome) 2008  . Myocardial infarction ([place])    2015  . PAD (peripheral artery disease) ([place])   . Pancreatitis 10/2011  . Pneumonia 07/2014  . RLS (restless legs syndrome)   . Schizophrenia ([place])   . Shortness of breath   . Skin cancer   . Small bowel obstruction ([place]) 2008   Past Surgical History:  Procedure Laterality Date  . ABDOMINAL ANGIOGRAM N/A 09/07/2014   Procedure: ABDOMINAL ANGIOGRAM;  Surgeon: Laverda Page, MD;  Location: [facility] CATH LAB;  Service: Cardiovascular;  Laterality: N/A;  . APPENDECTOMY  1996  . BIOPSY  08/06/2020   Procedure: BIOPSY;  Surgeon: Jackquline Denmark, MD;  Location: [facility] ENDOSCOPY;  Service: Endoscopy;;  . COLON SURGERY  2008   6 inches of colon removed due to obstruction  . COLONOSCOPY WITH PROPOFOL N/A 10/25/2016   Procedure: COLONOSCOPY WITH PROPOFOL;  Surgeon: Milus Banister, MD;  Location: [facility]OS[facility];  Service: Endoscopy;  Laterality: N/A;  . COLONOSCOPY WITH PROPOFOL N/A 08/07/2020   Procedure: COLONOSCOPY WITH PROPOFOL;  Surgeon: Jackquline Denmark, MD;  Location: [facility];  Service: Endoscopy;  Laterality: N/A;  . COLONOSCOPY WITH PROPOFOL N/A  08/27/2020   Procedure: COLONOSCOPY WITH PROPOFOL;  Surgeon: Lavena Bullion, DO;  Location: [facility];  Service: Gastroenterology;  Laterality: N/A;  . CORONARY ANGIOPLASTY WITH STENT PLACEMENT  09/07/2014   "2"  . ENTEROSCOPY N/A 08/27/2020   Procedure: ENTEROSCOPY;  Surgeon: Lavena Bullion, DO;  Location: [facility];  Service: Gastroenterology;  Laterality: N/A;  Push enteroscopy   . ESOPHAGOGASTRODUODENOSCOPY (EGD) WITH PROPOFOL N/A 08/06/2020   Procedure: ESOPHAGOGASTRODUODENOSCOPY (EGD) WITH  PROPOFOL;  Surgeon: Jackquline Denmark, MD;  Location: [facility];  Service: Endoscopy;  Laterality: N/A;  . GIVENS CAPSULE STUDY N/A 08/24/2020   Procedure: GIVENS CAPSULE STUDY;  Surgeon: Lavena Bullion, DO;  Location: [facility];  Service: Gastroenterology;  Laterality: N/A;  . HOT HEMOSTASIS N/A 08/07/2020   Procedure: HOT HEMOSTASIS (ARGON PLASMA COAGULATION/BICAP);  Surgeon: Jackquline Denmark, MD;  Location: [facility]OSCOPY;  Service: Endoscopy;  Laterality: N/A;  . HOT HEMOSTASIS N/A 08/27/2020   Procedure: HOT HEMOSTASIS (ARGON PLASMA COAGULATION/BICAP);  Surgeon: Lavena Bullion, DO;  Location: [facility];  Service: Gastroenterology;  Laterality: N/A;  . LEFT HEART CATH AND CORONARY ANGIOGRAPHY N/A 03/25/2018   Procedure: LEFT HEART CATH AND CORONARY ANGIOGRAPHY;  Surgeon: Nigel Mormon, MD;  Location: [place] CV LAB;  Service: Cardiovascular;  Laterality: N/A;  . LEFT HEART CATHETERIZATION WITH CORONARY ANGIOGRAM N/A 09/07/2014   Procedure: LEFT HEART CATHETERIZATION WITH CORONARY ANGIOGRAM;  Surgeon: Laverda Page, MD;  Location: [facility] CATH LAB;  Service: Cardiovascular;  Laterality: N/A;  . LOWER EXTREMITY ANGIOGRAPHY  10/29/2017   Procedure: Lower Extremity Angiography;  Surgeon: Adrian Prows, MD;  Location: [place] CV LAB;  Service: Cardiovascular;;  . PERIPHERAL VASCULAR CATHETERIZATION N/A 11/01/2015   Procedure: Renal Angiography;  Surgeon: Adrian Prows, MD;  Location: [place] CV LAB;  Service: Cardiovascular;  Laterality: N/A;  . PERIPHERAL VASCULAR CATHETERIZATION N/A 04/10/2016   Procedure: Renal Angiography;  Surgeon: Adrian Prows, MD;  Location: [place] CV LAB;  Service: Cardiovascular;  Laterality: N/A;  . PERIPHERAL VASCULAR CATHETERIZATION  04/10/2016   Procedure: Peripheral Vascular Intervention;  Surgeon: Adrian Prows, MD;  Location: [place] CV LAB;  Service: Cardiovascular;;  . PERIPHERAL VASCULAR INTERVENTION  10/29/2017   Procedure: PERIPHERAL VASCULAR  INTERVENTION;  Surgeon: Adrian Prows, MD;  Location: [place] CV LAB;  Service: Cardiovascular;;  . POLYPECTOMY  08/07/2020   Procedure: POLYPECTOMY;  Surgeon: Jackquline Denmark, MD;  Location: [facility];  Service: Endoscopy;;  . POLYPECTOMY  08/27/2020   Procedure: POLYPECTOMY;  Surgeon: Lavena Bullion, DO;  Location: [facility];  Service: Gastroenterology;;  . RENAL ANGIOGRAPHY N/A 10/29/2017   Procedure: RENAL ANGIOGRAPHY;  Surgeon: Adrian Prows, MD;  Location: [place] CV LAB;  Service: Cardiovascular;  Laterality: N/A;  . RENAL ANGIOGRAPHY N/A 05/10/2020   Procedure: RENAL ANGIOGRAPHY;  Surgeon: Nigel Mormon, MD;  Location: [place] CV LAB;  Service: Cardiovascular;  Laterality: N/A;  . RIGHT OOPHORECTOMY Right 1996  . TOTAL ABDOMINAL HYSTERECTOMY  1997   Family History  Problem Relation Age of Onset  . Other Mother        many bowel obstructions  . Heart disease Mother   . Colon polyps Mother   . Kidney cancer Father   . Bone cancer Father   . Diabetes Father   . Heart disease Father   . Diabetes Daughter   . Colon cancer Paternal Grandfather   . Heart disease Brother   . Esophageal cancer Neg Hx     Social History  Tobacco Use  . Smoking status: Current Some Day Smoker    Packs/day: 0.25    Years: 40.00    Pack years: 10.00    Types: Cigarettes  . Smokeless tobacco: Never Used  Substance Use Topics  . Alcohol use: No   Marital Status: Divorced  ROS  Review of Systems  Cardiovascular: Positive for dyspnea on exertion. Negative for chest pain, claudication and leg swelling.  Respiratory: Positive for cough.   Musculoskeletal: Positive for muscle cramps (occasionial night cramps). Negative for joint swelling.  Gastrointestinal: Positive for abdominal pain. Negative for heartburn and melena.  Neurological: Negative for dizziness.  Psychiatric/Behavioral: Positive for depression.   Objective  Blood pressure (!) 148/74, pulse 66, resp. rate 16,  height 5' 0.25" (1.53 m), weight 98 lb 12.8 oz (44.8 kg), SpO2 96 %.  Vitals with BMI 09/06/2020 08/27/2020 08/27/2020  Height 5' 0.25" - -  Weight 98 lbs 13 oz - -  BMI 16.10 - -  Systolic 960 454 098  Diastolic 74 63 80  Pulse 66 60 67     Physical Exam Constitutional:      General: She is not in acute distress.    Appearance: She is underweight.  Cardiovascular:     Rate and Rhythm: Normal rate and regular rhythm.     Pulses:          Carotid pulses are on the right side with bruit and on the left side with bruit.      Femoral pulses are 2+ on the right side and 2+ on the left side.      Popliteal pulses are 2+ on the right side and 2+ on the left side.       Dorsalis pedis pulses are 0 on the right side and 2+ on the left side.       Posterior tibial pulses are 0 on the right side and 2+ on the left side.     Heart sounds: Normal heart sounds. No murmur heard.  No gallop.      Comments: No leg edema. No JVD.   Pulmonary:     Effort: Pulmonary effort is normal. No accessory muscle usage or respiratory distress.     Breath sounds: Normal breath sounds.  Abdominal:     Palpations: Abdomen is soft.    Laboratory examination:   CMP Latest Ref Rng & Units 08/27/2020 08/25/2020 08/24/2020  Glucose 70 - 99 mg/dL 87 100(H) 102(H)  BUN 6 - 20 mg/dL '13 14 20  ' Creatinine 0.44 - 1.00 mg/dL 2.02(H) 1.80(H) 2.04(H)  Sodium 135 - 145 mmol/L 141 136 138  Potassium 3.5 - 5.1 mmol/L 4.6 3.7 4.3  Chloride 98 - 111 mmol/L 103 102 104  CO2 22 - 32 mmol/L '24 24 23  ' Calcium 8.9 - 10.3 mg/dL 8.9 8.9 8.6(L)  Total Protein 6.5 - 8.1 g/dL 6.7 - 5.9(L)  Total Bilirubin 0.3 - 1.2 mg/dL 0.9 - 0.4  Alkaline Phos 38 - 126 U/L 92 - 80  AST 15 - 41 U/L 29 - 20  ALT 0 - 44 U/L 14 - 11   CBC Latest Ref Rng & Units 08/27/2020 08/26/2020 08/26/2020  WBC 4.0 - 10.5 K/uL 8.5 - -  Hemoglobin 12.0 - 15.0 g/dL 9.0(L) 9.4(L) 8.8(L)  Hematocrit 36 - 46 % 30.3(L) 30.4(L) 28.8(L)  Platelets 150 - 400 K/uL 311  - -   Lipid Panel Recent Labs    10/14/19 1408 03/31/20 1336  CHOL 134 119  TRIG 81  92  LDLCALC 64 51  HDL 54 50  CHOLHDL 2.5  --     HEMOGLOBIN A1C No results found for: HGBA1C, MPG TSH No results for input(s): TSH in the last 8760 hours.   External labs:   Lab 03/01/2020:  Hb 10.9/HCT 34.4, normal indicis, platelet counts 376.  Serum glucose 100 mg, BUN 21, creatinine 2.59, EGFR 20 mL.  Sodium 140, potassium 5.9.  LFTs normal.  Medications and allergies   Allergies  Allergen Reactions  . Doxycycline Anaphylaxis and Hives  . Hydralazine Rash  . Hydrocodone-Acetaminophen Nausea And Vomiting  . Iohexol Itching    Pt has itching nose after iv contrast injection    Outpatient Medications Prior to Visit  Medication Sig Dispense Refill  . acetaminophen (TYLENOL) 500 MG tablet Take 1,000 mg by mouth every 8 (eight) hours as needed for mild pain or headache.    . albuterol (PROVENTIL HFA;VENTOLIN HFA) 108 (90 BASE) MCG/ACT inhaler Inhale 2 puffs into the lungs 2 (two) times daily as needed for wheezing or shortness of breath (asthma).     . ALPRAZolam (XANAX) 1 MG tablet Take 0.5 tablets by mouth daily as needed for anxiety.     Marland Kitchen amLODipine (NORVASC) 10 MG tablet Take 1 tablet (10 mg total) by mouth daily. 30 tablet 0  . atorvastatin (LIPITOR) 80 MG tablet Take 80 mg by mouth daily.     Marland Kitchen azelastine (ASTELIN) 0.1 % nasal spray Place 1 spray into both nostrils daily as needed for rhinitis or allergies.     Marland Kitchen clopidogrel (PLAVIX) 75 MG tablet Take 1 tablet (75 mg total) by mouth daily.    Marland Kitchen ezetimibe (ZETIA) 10 MG tablet Take 1 tablet (10 mg total) by mouth daily. 90 tablet 3  . isosorbide mononitrate (IMDUR) 60 MG 24 hr tablet TAKE 1 TABLET BY MOUTH DAILY (Patient taking differently: Take 60 mg by mouth daily. ) 90 tablet 3  . linaclotide (LINZESS) 72 MCG capsule Take 1 capsule (72 mcg total) by mouth daily before breakfast. 30 capsule 2  . megestrol (MEGACE) 20 MG tablet  Take 1 tablet by mouth daily.    . mometasone-formoterol (DULERA) 100-5 MCG/ACT AERO Take 2 puffs first thing in am and then another 2 puffs about 12 hours later. (Patient taking differently: Inhale 2 puffs into the lungs in the morning and at bedtime. ) 1 Inhaler 11  . nitroGLYCERIN (NITROSTAT) 0.4 MG SL tablet Place 1 tablet (0.4 mg total) under the tongue every 5 (five) minutes as needed for chest pain. 30 tablet 1  . oxyCODONE-acetaminophen (PERCOCET/ROXICET) 5-325 MG tablet Take 2 tablets by mouth every 4 (four) hours as needed for severe pain.    . pantoprazole (PROTONIX) 40 MG tablet TAKE 1 TABLET BY MOUTH TWICE (2) DAILY (Patient taking differently: Take 80 mg by mouth daily. ) 180 tablet 2  . polyethylene glycol (MIRALAX) 17 g packet Take 17 g by mouth daily. 14 each 0  . promethazine (PHENERGAN) 25 MG tablet Take 1 tablet by mouth daily as needed for nausea or vomiting.     . venlafaxine XR (EFFEXOR XR) 150 MG 24 hr capsule Take 1 capsule (150 mg total) by mouth daily with breakfast. (Patient taking differently: Take 300 mg by mouth daily with breakfast. ) 30 capsule 0  . aspirin EC 81 MG tablet Take 1 tablet (81 mg total) by mouth daily.    Marland Kitchen atenolol (TENORMIN) 25 MG tablet TAKE 1 TABLET BY MOUTH ONCE A DAY (Patient  taking differently: Take 25 mg by mouth daily. ) 90 tablet 1   No facility-administered medications prior to visit.   Radiology:   No results found.  Cardiac Studies:   Renal arteriogram 02/29/2018: Widely patient stents. 04/10/2016: Scoring balloon angioplasty left in-stent restenosis with 6 x 20 mm angiosculpt balloon.  Right renal artery stent widely patent. H/O bilateral renal stenting on 09/07/2014: Right renal artery 6.0 x 15 mm Herculink widely patent, left renal artery  6.0 x 18 mm Herculink stent.  Renal artery duplex 09/18/2017: Hemodynamically significant stenosis of the right renal artery. Peak Velocity of 308/66 cm/s. Normal intrarenal vascular perfusion is  noted in both kidneys. There is increased echogenecity of both kidneys suggests medico-renal disease with right kidney slightly shrunk at 8.03x3.97x4 cm compared to 9.144.664.6 cm Diffuse plaque noted in the abdominal aorta with calcification.  Compared to the study done on 01/22/2017, left renal artery stenosis not evident, right renal artery stenosis new.  Patient has h/o bilateral renal artery stenting.  Abdominal aortogram 10/29/2017: Widely patent renal arteries. Distal abdominal aorta diffuse disease, calcification with 80% stenosis S/P 10 x 29 mm Omnilink Elite stent, 80% to 0%.  Lexiscan myoview stress test 02/17/2018:  1. Lexiscan stress test was performed. Exercise capacity was not assessed. Stress symptoms included dizziness, nausea, headache, and chest pressure.. Peak blood pressure was 176/88 mmHg. Stress EKG is non diagnostic for ischemia as it is a pharmacologic stress. In addition, it showed The stress electrocardiogram showed sinus tachycardia, normal stress conduction, left ventricular hypertrophy, no stress arrhythmias and normal stress repolarization. 2. The overall quality of the study is excellent. Left ventricular cavity is noted to be normal on the rest and stress studies. Gated SPECT images reveal normal myocardial thickening and wall motion. The left ventricular ejection fraction was calculated or visually estimated to be 78%. SPECT images demonstrate small perfusion abnormality of moderate intensity in the mid anterolateral and apical lateral myocardial wall(s) on the stress images, reversible on rest images. Findings suggest small area of moderate intensity ischemia in mid to apical anterolateral myocardium. Intermediate risk study.  Cardiac Cath 03/25/2018: Normal coronary arteries.  Lower extremity arterial duplex 12/05/2018: No hemodynamically significant stenoses are identified in the lower extremity arterial system.  This exam reveals moderately decreased perfusion of  the right lower extremity, noted at the dorsalis pedis and post tibial artery level (ABI 0.68) and mildly decreased perfusion of the left lower extremity, noted at the post tibial artery level (ABI 0.83). Triphasic waveform the the major vessels suggest diffuse small vessel disease. Left proximal SFA has < 50% stenosis.  208/06/2016, mild progression of PAD with decreased ABI bilaterally compared to left ABI 0.95 and right ABI 0.94.  Abdominal Aortic Duplex  05/27/2019: Moderate plaque noted in the proximal, mid and distal aorta.  No abdominal aortic aneurysm. Distal abdominal aortic velocity is mildly elevated suggestive of <50% stenosis. Patient has h/o distal abdominal aortic stenosis and stenting on 10/29/2017. The stent appears patent.   Carotid artery duplex  10/09/2019: Doppler flow velocities in the right internal carotid artery are consistent with stenosis in the range of 50-69% with >= 50% heterogeneous plaque. Doppler flow velocities in the left internal carotid artery are consistent with stenosis in the range of 50-69% with >= 50% heterogeneous plaque. Antegrade right vertebral artery flow. Antegrade left vertebral artery flow. Compared to the study done on 04/17/2019, this progression of disease, right ICA stenosis is new. Follow up in six months is appropriate if clinically indicated.  Echocardiogram 10/16/2019:  Normal LV systolic function with EF 60%. Left ventricle cavity is normal in size. Normal global wall motion. Normal diastolic filling pattern.  Calculated EF 60%.  Left atrial cavity is mildly dilated in 4 chamber views.  Structurally normal mitral valve. Mild (Grade I) mitral regurgitation.  Structurally normal tricuspid valve. Mild tricuspid regurgitation. No evidence of pulmonary hypertension.  Compared to 02/25/2017, no significant change.   Renal angiogram 05/10/2020: Patent Rt renal stent (placed 2015, 6.0 x 15 mm Herculink) 100% restenosis of Lt renals stent  with flush occlusion 6.0 x 18 mm Herculink)  Successful angioplasty Lt renal stent Antegrade dissection in distal Lt renal artery with decreased distal flow, too distal for stenting. There is blush in left kidney.  She will be monitored for 6-8 hrs for any signs of renal infarct.   EKG  EKG 03/24/2020: Normal sinus rhythm with rate of 58 bpm, normal axis, no evidence of ischemia.  Borderline criteria for LVH with R in V5 26 mm.   No significant change from EKG 02/10/2020.  Assessment     ICD-10-CM   1. Resistant hypertension  I10 atenolol (TENORMIN) 50 MG tablet  2. Bilateral renal artery stenosis (HCC)  I70.1   3. CKD (chronic kidney disease) stage 4, GFR 15-29 ml/min (HCC)  N18.4   4. Lower GI bleed  K92.2     Meds ordered this encounter  Medications  . atenolol (TENORMIN) 50 MG tablet    Sig: Take 1 tablet (50 mg total) by mouth daily.    Dispense:  90 tablet    Refill:  1    Patient will call for refill. She will finish 25 mg Rx first    Medications Discontinued During This Encounter  Medication Reason  . aspirin EC 81 MG tablet Discontinued by provider  . atenolol (TENORMIN) 25 MG tablet     Recommendations:   Geraline Halberstadt  is a 59 y.o. Caucasian female  with hypertension, COPD, diastolic CHF, ongoing tobacco abuse (2 cig a day), and difficult to control hypertension with CKD, with bilateral renal artery angioplasty, PAD with distal abdominal aorta stenosis S/P stenting with 10x 29 mm Omnilink Elite stent. She underwent coronary angiogram in June 2019 for nitroglycerin responsive chest pain that revealed normal coronary arteries.   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  She underwent a partially successful balloon angioplasty.  Blood pressure today is well controlled.  In view of underlying palpitations, I increase the dose of atenolol to  50 mg daily.  Although blood pressure is elevated, for her systolic blood pressure of 140 to 150 mmHg is very acceptable.  Although she has reduced smoking, complete abstinence from tobacco discussed with the patient.  She has had 2 recurrent hospital admission on 11/4 and 08/23/2020 with lower GI bleed requiring 2 units and again 1 unit of packed RBC transfusion.  In view of this I will discontinue aspirin.  Otherwise lipids are well controlled, she has a follow-up appointment on the 15th with her PCP for management of anemia.  I'll see him back in 6 months.   Adrian Prows, MD, [facility] 09/06/2020, 5:20 PM Office: [phone]

## 2020-10-07 ENCOUNTER — Ambulatory Visit: Payer: Medicare Other

## 2020-10-10 ENCOUNTER — Ambulatory Visit: Payer: Medicare Other | Attending: Family Medicine | Admitting: Physical Therapy

## 2020-10-10 ENCOUNTER — Other Ambulatory Visit: Payer: Self-pay

## 2020-10-10 ENCOUNTER — Encounter: Payer: Self-pay | Admitting: Physical Therapy

## 2020-10-10 DIAGNOSIS — R293 Abnormal posture: Secondary | ICD-10-CM | POA: Diagnosis not present

## 2020-10-10 DIAGNOSIS — M542 Cervicalgia: Secondary | ICD-10-CM | POA: Insufficient documentation

## 2020-10-10 DIAGNOSIS — M546 Pain in thoracic spine: Secondary | ICD-10-CM | POA: Diagnosis present

## 2020-10-10 NOTE — Therapy (Signed)
[place], [place], [address] Phone: [phone]   Fax:  [phone]  Physical Therapy Evaluation  Patient Details  Name: [name] MRN: [id number] Date of Birth: Jul 03, 1961 Referring Provider (PT): Bernerd Limbo, MD   Encounter Date: 10/10/2020   PT End of Session - 10/10/20 1413    Visit Number 1    Number of Visits 9    Date for PT Re-Evaluation 12/05/20    Authorization Type [employer] Medicare    Progress Note Due on Visit 10    PT Start Time 1410    PT Stop Time 1450    PT Time Calculation (min) 40 min    Activity Tolerance Patient tolerated treatment well    Behavior During Therapy [facility] for tasks assessed/performed           Past Medical History:  Diagnosis Date  . Anemia 2008  . Anxiety   . Aortic stenosis   . Arthritis    "all my joints ache" (09/07/2014)  . Asthma   . Bipolar 1 disorder ([place])   . Bradycardia   . Bruit   . Carpal tunnel syndrome   . Cervical cancer ([place]) 1985  . CHF (congestive heart failure) ([place])   . Chronic kidney disease (CKD), stage V ([place])   . COPD (chronic obstructive pulmonary disease) ([place]) 2000  . Coronary artery disease   . Daily headache   . Depression 2000  . Diverticulitis 2008  . GERD (gastroesophageal reflux disease)   . Glaucoma   . Heart murmur   . History of colon polyps 2009  . HLD (hyperlipidemia) 2013  . Hypertension 2013  . Hypovitaminosis D   . IBS (irritable bowel syndrome) 2008  . Myocardial infarction ([place])    2015  . PAD (peripheral artery disease) ([place])   . Pancreatitis 10/2011  . Pneumonia 07/2014  . RLS (restless legs syndrome)   . Schizophrenia ([place])   . Shortness of breath   . Skin cancer   . Small bowel obstruction ([place]) 2008    Past Surgical History:  Procedure Laterality Date  . ABDOMINAL ANGIOGRAM N/A 09/07/2014   Procedure: ABDOMINAL ANGIOGRAM;  Surgeon: Laverda Page, MD;  Location: [facility] CATH LAB;   Service: Cardiovascular;  Laterality: N/A;  . APPENDECTOMY  1996  . BIOPSY  08/06/2020   Procedure: BIOPSY;  Surgeon: Jackquline Denmark, MD;  Location: [facility] ENDOSCOPY;  Service: Endoscopy;;  . COLON SURGERY  2008   6 inches of colon removed due to obstruction  . COLONOSCOPY WITH PROPOFOL N/A 10/25/2016   Procedure: COLONOSCOPY WITH PROPOFOL;  Surgeon: Milus Banister, MD;  Location: [facility] ENDOSCOPY;  Service: Endoscopy;  Laterality: N/A;  . COLONOSCOPY WITH PROPOFOL N/A 08/07/2020   Procedure: COLONOSCOPY WITH PROPOFOL;  Surgeon: Jackquline Denmark, MD;  Location: [facility]OS[facility];  Service: Endoscopy;  Laterality: N/A;  . COLONOSCOPY WITH PROPOFOL N/A 08/27/2020   Procedure: COLONOSCOPY WITH PROPOFOL;  Surgeon: Lavena Bullion, DO;  Location: [facility]OS[facility];  Service: Gastroenterology;  Laterality: N/A;  . CORONARY ANGIOPLASTY WITH STENT PLACEMENT  09/07/2014   "2"  . ENTEROSCOPY N/A 08/27/2020   Procedure: ENTEROSCOPY;  Surgeon: Lavena Bullion, DO;  [facility]: [facility];  Service: Gastroenterology;  Laterality: N/A;  Push enteroscopy   . ESOPHAGOGASTRODUODENOSCOPY (EGD) WITH PROPOFOL N/A 08/06/2020   Procedure: ESOPHAGOGASTRODUODENOSCOPY (EGD) WITH PROPOFOL;  Surgeon: Jackquline Denmark, MD;  [facility];  Service: Endoscopy;  Laterality: N/A;  . GIVENS CAPSULE STUDY N/A 08/24/2020  Procedure: GIVENS CAPSULE STUDY;  Surgeon: Lavena Bullion, DO;  [facility];  Service: Gastroenterology;  Laterality: N/A;  . HOT HEMOSTASIS N/A 08/07/2020   Procedure: HOT HEMOSTASIS (ARGON PLASMA COAGULATION/BICAP);  Surgeon: Jackquline Denmark, MD;  Location: [facility] ENDOSCOPY;  Service: Endoscopy;  Laterality: N/A;  . HOT HEMOSTASIS N/A 08/27/2020   Procedure: HOT HEMOSTASIS (ARGON PLASMA COAGULATION/BICAP);  Surgeon: Lavena Bullion, DO;  [facility];  Service: Gastroenterology;  Laterality: N/A;  . LEFT HEART CATH AND CORONARY ANGIOGRAPHY N/A 03/25/2018   Procedure: LEFT HEART CATH AND CORONARY  ANGIOGRAPHY;  Surgeon: Nigel Mormon, MD;  Location: [place] CV LAB;  Service: Cardiovascular;  Laterality: N/A;  . LEFT HEART CATHETERIZATION WITH CORONARY ANGIOGRAM N/A 09/07/2014   Procedure: LEFT HEART CATHETERIZATION WITH CORONARY ANGIOGRAM;  Surgeon: Laverda Page, MD;  Location[facility] CATH LAB;  Service: Cardiovascular;  Laterality: N/A;  . LOWER EXTREMITY ANGIOGRAPHY  10/29/2017   Procedure: Lower Extremity Angiography;  Surgeon: Adrian Prows, MD;  Location: [place] CV LAB;  Service: Cardiovascular;;  . PERIPHERAL VASCULAR CATHETERIZATION N/A 11/01/2015   Procedure: Renal Angiography;  Surgeon: Adrian Prows, MD;  Location: [place] CV LAB;  Service: Cardiovascular;  Laterality: N/A;  . PERIPHERAL VASCULAR CATHETERIZATION N/A 04/10/2016   Procedure: Renal Angiography;  Surgeon: Adrian Prows, MD;  Location: [place] CV LAB;  Service: Cardiovascular;  Laterality: N/A;  . PERIPHERAL VASCULAR CATHETERIZATION  04/10/2016   Procedure: Peripheral Vascular Intervention;  Surgeon: Adrian Prows, MD;  Location: [place] CV LAB;  Service: Cardiovascular;;  . PERIPHERAL VASCULAR INTERVENTION  10/29/2017   Procedure: PERIPHERAL VASCULAR INTERVENTION;  Surgeon: Adrian Prows, MD;  Location: [place] CV LAB;  Service: Cardiovascular;;  . POLYPECTOMY  08/07/2020   Procedure: POLYPECTOMY;  Surgeon: Jackquline Denmark, MD;  [facility];  Service: Endoscopy;;  . POLYPECTOMY  08/27/2020   Procedure: POLYPECTOMY;  Surgeon: Lavena Bullion, DO;  [facility];  Service: Gastroenterology;;  . RENAL ANGIOGRAPHY N/A 10/29/2017   Procedure: RENAL ANGIOGRAPHY;  Surgeon: Adrian Prows, MD;  Location: [place] CV LAB;  Service: Cardiovascular;  Laterality: N/A;  . RENAL ANGIOGRAPHY N/A 05/10/2020   Procedure: RENAL ANGIOGRAPHY;  Surgeon: Nigel Mormon, MD;  Location: [place] CV LAB;  Service: Cardiovascular;  Laterality: N/A;  . RIGHT OOPHORECTOMY Right 1996  . TOTAL ABDOMINAL  HYSTERECTOMY  1997    There were no vitals filed for this visit.    Subjective Assessment - 10/10/20 1415    Subjective Pt reports she has a R thoracic muscle knot. Pt states this has been there for years. Pt reports increased pain affecting chores (i.e. dishes, laundry, and sweeping). More than 10 minutes and her back kills her straight across. Pt reports increased falls.    Pertinent History No surgeries, 2 bulging discs in cervical spine & 1 in thoracic, COPD, emphysema, stage IV kidney disease, anemia/GIB    Limitations Walking;Lifting;Standing    How long can you sit comfortably? 10-15 minutes    How long can you stand comfortably? 10-15 minutes    How long can you walk comfortably? Unable to get around [facility] without pain    Patient Stated Goals Decrease thoracic pain to improve chores    Currently in Pain? Yes    Pain Score 6     Pain Location Thoracic    Pain Orientation Right;Posterior    Pain Descriptors / Indicators Spasm;Stabbing;Shooting    Pain Type Chronic pain    Pain Radiating Towards Down low  back    Pain Onset More than a month ago    Pain Frequency Constant    Pain Relieving Factors Tylenol    Effect of Pain on Daily Activities Difficulty performing IADLs              [facility] PT Assessment - 10/10/20 0001      Assessment   Medical Diagnosis M54.6 (ICD-10-CM) - Pain in thoracic spine    Referring Provider (PT) Bernerd Limbo, MD      Precautions   Precautions Fall      Balance Screen   Has the patient fallen in the past 6 months Yes    How many times? 4    Has the patient had a decrease in activity level because of a fear of falling?  No    Is the patient reluctant to leave their home because of a fear of falling?  No      Home Ecologist residence    Living Arrangements Spouse/significant other;Children    Available Help at Discharge Family    Type of [place] to enter    Entrance Stairs-Number  of Steps 4    Entrance Stairs-Rails --   Holds on to post   [place] One level      Prior Function   Level of [place] On disability      Observation/Other Assessments   Focus on Therapeutic Outcomes (FOTO)  needs to complete FOTO next visit      Sensation   Light Touch --   Reports neuropathy on top of her right foot     Posture/Postural Control   Posture/Postural Control Postural limitations    Postural Limitations Rounded Shoulders;Forward head;Increased thoracic kyphosis;Decreased lumbar lordosis;Flexed trunk    Posture Comments R shoulder lower than left (history of broken R clavicle)      ROM / Strength   AROM / PROM / Strength AROM;Strength      AROM   AROM Assessment Site Thoracic;Cervical    Cervical Flexion WFL    Cervical Extension 10%   Limited due to pain   Cervical - Right Side Bend 50%    Cervical - Left Side Bend 50%    Cervical - Right Rotation 50%   Limited due to pain   Cervical - Left Rotation 75%    Thoracic Flexion [facility]    Thoracic Extension 10%   Increased pain   Thoracic - Right Side Bend 60%    Thoracic - Left Side Bend 50%    Thoracic - Right Rotation 50%   Increased pain   Thoracic - Left Rotation [facility]      Strength   Overall Strength Comments R shoulder flexion 3/5; otherwise bilat shoulders WFL   hx broken R collar bone     Palpation   Spinal mobility Hypomobile throughout thoracic spine    Palpation comment Trigger points along rhomboids, lats, mid/low trap                      Objective measurements completed on examination: See above findings.               PT Education - 10/10/20 1520    Education Details Discussed POC, exam findings, and self massage/trigger point release with tennis ball.    Person(s) Educated Patient    Methods Explanation;Handout;Verbal cues    Comprehension Verbalized understanding;Returned demonstration;Need further instruction  PT Short Term  Goals - 10/10/20 1521      PT SHORT TERM GOAL #1   Title Pt will be independent with initial stretching program    Time 4    Period Weeks    Status New    Target Date 11/07/20      PT SHORT TERM GOAL #2   Title Pt will report 50% decrease in pain from baseline    Baseline 6/10 on eval    Time 4    Period Weeks    Status New    Target Date 11/07/20      PT SHORT TERM GOAL #3   Title Pt will be able to improve thoracic mobility by at least 10% in areas of greatest restriction    Baseline Most limited in extension, right rotation, and left side bending    Time 4    Period Weeks    Status New    Target Date 11/07/20      PT SHORT TERM GOAL #4   Title Pt will be able to wash dishes with </=4/10 pain    Baseline Unable due to pain    Time 4    Period Weeks    Status New    Target Date 11/07/20             PT Long Term Goals - 10/10/20 1525      PT LONG TERM GOAL #1   Title Pt will be independent with advanced HEP    Time 8    Period Weeks    Status New    Target Date 12/05/20      PT LONG TERM GOAL #2   Title Pt will be able to verbalize at least 3 methods to manage her pain independently at home    Time 8    Period Weeks    Status New    Target Date 12/05/20      PT LONG TERM GOAL #3   Title Pt will improve thoracic mobility by ~25% from initial eval    Time 8    Period Weeks    Status New    Target Date 12/05/20      PT LONG TERM GOAL #4   Title Pt will be able to walk around [employer] to shop with </=2/10 pain    Time 8    Period Weeks    Status New    Target Date 12/05/20                  Plan - 10/10/20 1514    Clinical Impression Statement Pt is a 60 y/o F presenting to OPPT with c/o R mid back pain. PMH significant for CKD, anemia/GIB, CHF, COPD, partial colectomy. Pt demos decreased thoracic, lumbar, and cervical ROM with multiple thoracic trigger points, decreased right shoulder strength, muscle endurance, and pain affecting her ability  to perform IADLs at home. Pt with history of falls but this seems more related to her chronic conditions (i.e. anemia). Pt would benefit from PT to address these issues to optimize her level of function.    Personal Factors and Comorbidities Age;Comorbidity 1;Comorbidity 2;Comorbidity 3+;Time since onset of injury/illness/exacerbation;Past/Current Experience    Comorbidities CKD, anemia/GIB, CHF, COPD, partial colectomy, R collarbone fracture    Examination-Activity Limitations Caring for Others;Carry;Locomotion Level    Examination-Participation Restrictions Cleaning;Community Activity;Laundry;Yard Work    Stability/Clinical Decision Making Evolving/Moderate complexity    Clinical Decision Making Moderate    Rehab Potential Good  PT Frequency 1x / week    PT Duration 8 weeks    PT Treatment/Interventions ADLs/Self Care Home Management;Cryotherapy;Electrical Stimulation;Iontophoresis 4mg /ml Dexamethasone;Ultrasound;Traction;Moist Heat;Gait training;Stair training;Functional mobility training;Therapeutic activities;Therapeutic exercise;Balance training;Neuromuscular re-education;Manual techniques;Patient/family education;Passive range of motion;Dry needling;Energy conservation;Taping;Vasopneumatic Device;Spinal Manipulations;Joint Manipulations    PT Next Visit Plan Complete FOTO next visit. Improve cervical/thoracic ROM via manual therapy and stretching (pt with difficulty tolerating prolonged prone or supine). Initiate scapular strengthening and posture education. Consider rib mobilization if indicated and TPDN.    PT Home Exercise Plan Access Code: [email or web address]    Consulted and Agree with Plan of Care Patient           Patient will benefit from skilled therapeutic intervention in order to improve the following deficits and impairments:  Decreased range of motion,Difficulty walking,Decreased endurance,Impaired UE functional use,Decreased activity tolerance,Pain,Decreased  balance,Hypomobility,Decreased strength,Decreased mobility,Improper body mechanics,Postural dysfunction  Visit Diagnosis: Abnormal posture  Pain in thoracic spine  Cervicalgia     Problem List Patient Active Problem List   Diagnosis Date Noted  . Polyp of ascending colon   . AVM (arteriovenous malformation) of small bowel, acquired with hemorrhage   . GIB (gastrointestinal bleeding) 08/23/2020  . Hematochezia   . Diverticulosis of colon without hemorrhage   . Pain of upper abdomen   . GI bleed 08/06/2020  . Acute GI bleeding 08/05/2020  . Acute blood loss anemia 08/05/2020  . Renal artery stenosis ([place]) 05/10/2020  . Abdominal aortic stenosis 11/30/2018  . Pure hypercholesterolemia 10/22/2018  . Abnormal stress test 03/21/2018  . At high risk for injury related to fall 12/26/2017  . Renal artery stenosis, native, bilateral ([place]) 10/27/2017  . Benign neoplasm of transverse colon   . Benign neoplasm of descending colon   . Benign neoplasm of sigmoid colon   . Abnormality on screening test 06/22/2016  . Chest pain 11/01/2015  . History of partial colectomy 10/19/2014  . 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 Anemia, chronic disease 07/30/2014  . Heart failure with preserved ejection fraction ([place]) 07/02/2014  . COPD GOLD 0 / still smoking 07/02/2014  . CKD (chronic kidney disease) stage 3, GFR 30-59 ml/min ([facility]) 07/02/2014  . Protein-calorie malnutrition, severe ([place]) 07/02/2014  . Hyperparathyroidism, secondary renal ([place]) 04/30/2014  . Postsurgical menopause 08/14/2013  . Vaginal atrophy 08/14/2013  . Screening for breast cancer 07/21/2012  . Diverticulitis of colon with perforation 06/18/2012  . Affective bipolar disorder ([place]) 02/02/2009  . ADHESIONS, INTESTINAL W/OBSTRUCTION 08/10/2008  . Irritable bowel syndrome 08/10/2008  . Abdominal pain, generalized 08/10/2008  . FECAL OCCULT BLOOD 08/10/2008  . Anemia, iron deficiency 08/10/2008  . Anxiety  state 08/09/2008  . DEPRESSION 08/09/2008  . Essential hypertension 08/09/2008  . ASTHMA 08/09/2008  . ESOPHAGEAL STRICTURE 08/09/2008  . GERD 08/09/2008  . HIATAL HERNIA 08/09/2008  . Diverticulosis of large intestine 08/09/2008  . ARTHRITIS 08/09/2008  . HEADACHE, CHRONIC 08/09/2008  . SKIN CANCER, HX OF 08/09/2008  . COLONIC POLYPS, HYPERPLASTIC, HX OF 08/09/2008  . Cigarette smoker 05/17/2008  . Schizophrenia [facility]) 04/30/2008    Jamone Garrido April Ma L Osby Sweetin PT, DPT 10/10/2020, 4:53 PM  [facility] [address] [place], [place], [address] Phone: [phone]   Fax:  [phone]  Name: Elayna Tobler MRN: [id number] Date of Birth: Jun 04, 1961

## 2020-10-10 NOTE — Patient Instructions (Signed)
Access Code: [email or web address] URL: [email or web address] Date: 10/10/2020 Prepared by: Estill Bamberg April Thurnell Garbe  Exercises Standing 'L' Stretch at Counter - 1 x daily - 7 x weekly - 1 sets - 10 reps Plank with Thoracic Rotation on Counter - 1 x daily - 7 x weekly - 1 sets - 10 reps Lateral Shift Correction at Wall - 1 x daily - 7 x weekly - 1 sets - 10 reps  Patient Education Trigger Point Dry Needling

## 2020-10-24 ENCOUNTER — Other Ambulatory Visit (INDEPENDENT_AMBULATORY_CARE_PROVIDER_SITE_OTHER): Payer: Medicare Other

## 2020-10-24 ENCOUNTER — Encounter: Payer: Self-pay | Admitting: Gastroenterology

## 2020-10-24 ENCOUNTER — Ambulatory Visit (INDEPENDENT_AMBULATORY_CARE_PROVIDER_SITE_OTHER): Payer: Medicare Other | Admitting: Gastroenterology

## 2020-10-24 DIAGNOSIS — D649 Anemia, unspecified: Secondary | ICD-10-CM

## 2020-10-24 DIAGNOSIS — N19 Unspecified kidney failure: Secondary | ICD-10-CM

## 2020-10-24 DIAGNOSIS — R1013 Epigastric pain: Secondary | ICD-10-CM

## 2020-10-24 LAB — CBC
HCT: 35.5 % — ABNORMAL LOW (ref 36.0–46.0)
Hemoglobin: 11.5 g/dL — ABNORMAL LOW (ref 12.0–15.0)
MCHC: 32.5 g/dL (ref 30.0–36.0)
MCV: 85.2 fl (ref 78.0–100.0)
Platelets: 555 10*3/uL — ABNORMAL HIGH (ref 150.0–400.0)
RBC: 4.17 Mil/uL (ref 3.87–5.11)
RDW: 20.8 % — ABNORMAL HIGH (ref 11.5–15.5)
WBC: 7.1 10*3/uL (ref 4.0–10.5)

## 2020-10-24 LAB — BASIC METABOLIC PANEL
BUN: 14 mg/dL (ref 6–23)
CO2: 25 mEq/L (ref 19–32)
Calcium: 9.1 mg/dL (ref 8.4–10.5)
Chloride: 102 mEq/L (ref 96–112)
Creatinine, Ser: 2.13 mg/dL — ABNORMAL HIGH (ref 0.40–1.20)
GFR: 25.15 mL/min — ABNORMAL LOW (ref >60.00)
Glucose, Bld: 92 mg/dL (ref 70–99)
Potassium: 4.2 mEq/L (ref 3.5–5.1)
Sodium: 134 mEq/L — ABNORMAL LOW (ref 135–145)

## 2020-10-24 NOTE — Patient Instructions (Signed)
If you are age 58 or younger, your body mass index should be between 19-25. Your Body mass index is 18.11 kg/m. If this is out of the aformentioned range listed, please consider follow up with your Primary Care Provider.   You have been scheduled for an abdominal ultrasound at [facility] (1st floor of hospital) on 10-28-2020 at 9am. Please arrive 15 minutes prior to your appointment for registration. Make certain not to have anything to eat or drink 6 hours prior to your appointment. Should you need to reschedule your appointment, please contact radiology at [phone]. This test typically takes about 30 minutes to perform.  Your provider has requested that you go to the basement level for lab work before leaving today. Press "B" on the elevator. The lab is located at the first door on the left as you exit the elevator.  Due to recent changes in healthcare laws, you may see the results of your imaging and laboratory studies on MyChart before your provider has had a chance to review them.  We understand that in some cases there may be results that are confusing or concerning to you. Not all laboratory results come back in the same time frame and the provider may be waiting for multiple results in order to interpret others.  Please give [place] 48 hours in order for your provider to thoroughly review all the results before contacting the office for clarification of your results.   Thank you for entrusting me with your care and choosing [facility] ([place]).  Dr Ardis Hughs

## 2020-10-24 NOTE — Progress Notes (Signed)
Colonoscopy November 2021 while inpatient, Dr. Bryan Lemma.  "Fair" prep.  20 mm polyp in the ascending was removed with a hot snare, piecemeal technique.  This was a sessile serrated polyp. 08/07/2020 colonoscopy Dr. Lyndel Safe; with a single nonbleeding colonic angiodysplastic lesion treated with APC, 1 8 mm polyp in the distal sigmoid colon, minimal neosigmoid diverticulosis, patent end-to-end colocolonic anastomosis characterized by healthy-appearing mucosa, nonbleeding internal hemorrhoids.  It was felt the patient would likely need a repeat colonoscopy in 6 months with a 2-day preparation due to poor visualization.  Pathology showed hyperplastic polyp.  Enteroscopy November 2021 while inpatient, Dr. Bryan Lemma.  The esophagus and stomach were normal.  Small AVM was found and treated with APC in the duodenum bulb.  A small AVM was found and treated with APC in the proximal jejunum. 08/06/2020 EGD Dr. Lyndel Safe with food residue in the stomach with outlet obstruction, otherwise normal.  No upper GI bleeding.  Small bowel capsule endoscopy November 2021 while inpatient showed small AVM in the duodenal bulb, small AVM in the proximal jejunum  CT scan abdomen pelvis without IV contrast August 04, 2020 showed diffuse vascular atherosclerosis, trace left pleural effusion, otherwise normal.  CT scan abdomen pelvis without IV contrast August 24, 2020 showed "fluid-filled colon, consistent with diarrheal illness"  Could not get IV contrast due to chronic renal insufficiency with creatinines around 2   HPI: This is a very pleasant 60 year old woman whom I last saw 2 or 3 months ago.  Her weight is down 6 pounds from 2 months ago, same scale here in our office.  She continues to have problems with postprandial epigastric abdominal pain, weight loss.  Chronic constipation which seems fairly responsive to Linzess  She believes she has lost 25 pounds in the past 3 or 4 months.  She has epigastric throbbing  pain shortly after eating most anything.  She does not take NSAIDs.  ROS: complete GI ROS as described in HPI, all other review negative.  Constitutional:  No unintentional weight loss   Past Medical History:  Diagnosis Date  . Anemia 2008  . Anxiety   . Aortic stenosis   . Arthritis    "all my joints ache" (09/07/2014)  . Asthma   . Bipolar 1 disorder ([place])   . Bradycardia   . Bruit   . Carpal tunnel syndrome   . Cervical cancer ([place]) 1985  . CHF (congestive heart failure) ([place])   . Chronic kidney disease (CKD), stage V ([place])   . COPD (chronic obstructive pulmonary disease) ([place]) 2000  . Coronary artery disease   . Daily headache   . Depression 2000  . Diverticulitis 2008  . GERD (gastroesophageal reflux disease)   . Glaucoma   . Heart murmur   . History of colon polyps 2009  . HLD (hyperlipidemia) 2013  . Hypertension 2013  . Hypovitaminosis D   . IBS (irritable bowel syndrome) 2008  . Myocardial infarction ([place])    2015  . PAD (peripheral artery disease) ([place])   . Pancreatitis 10/2011  . Pneumonia 07/2014  . RLS (restless legs syndrome)   . Schizophrenia ([place])   . Shortness of breath   . Skin cancer   . Small bowel obstruction ([place]) 2008    Past Surgical History:  Procedure Laterality Date  . ABDOMINAL ANGIOGRAM N/A 09/07/2014   Procedure: ABDOMINAL ANGIOGRAM;  Surgeon: Laverda Page, MD;  Location: [facility] CATH LAB;  Service: Cardiovascular;  Laterality: N/A;  . APPENDECTOMY  1996  .  BIOPSY  08/06/2020   Procedure: BIOPSY;  Surgeon: Jackquline Denmark, MD;  Location: [facility] ENDOSCOPY;  Service: Endoscopy;;  . COLON SURGERY  2008   6 inches of colon removed due to obstruction  . COLONOSCOPY WITH PROPOFOL N/A 10/25/2016   Procedure: COLONOSCOPY WITH PROPOFOL;  Surgeon: Milus Banister, MD;  Location: [facility] ENDOSCOPY;  Service: Endoscopy;  Laterality: N/A;  . COLONOSCOPY WITH PROPOFOL N/A 08/07/2020   Procedure: COLONOSCOPY WITH PROPOFOL;  Surgeon: Jackquline Denmark, MD;   Location: [facility] ENDOSCOPY;  Service: Endoscopy;  Laterality: N/A;  . COLONOSCOPY WITH PROPOFOL N/A 08/27/2020   Procedure: COLONOSCOPY WITH PROPOFOL;  Surgeon: Lavena Bullion, DO;  Location: [facility] ENDOSCOPY;  Service: Gastroenterology;  Laterality: N/A;  . CORONARY ANGIOPLASTY WITH STENT PLACEMENT  09/07/2014   "2"  . ENTEROSCOPY N/A 08/27/2020   Procedure: ENTEROSCOPY;  Surgeon: Lavena Bullion, DO;  Location: [facility] ENDOSCOPY;  Service: Gastroenterology;  Laterality: N/A;  Push enteroscopy   . ESOPHAGOGASTRODUODENOSCOPY (EGD) WITH PROPOFOL N/A 08/06/2020   Procedure: ESOPHAGOGASTRODUODENOSCOPY (EGD) WITH PROPOFOL;  Surgeon: Jackquline Denmark, MD;  Location: [facility] ENDOSCOPY;  Service: Endoscopy;  Laterality: N/A;  . GIVENS CAPSULE STUDY N/A 08/24/2020   Procedure: GIVENS CAPSULE STUDY;  Surgeon: Lavena Bullion, DO;  Location: [facility] ENDOSCOPY;  Service: Gastroenterology;  Laterality: N/A;  . HOT HEMOSTASIS N/A 08/07/2020   Procedure: HOT HEMOSTASIS (ARGON PLASMA COAGULATION/BICAP);  Surgeon: Jackquline Denmark, MD;  Location: [facility] ENDOSCOPY;  Service: Endoscopy;  Laterality: N/A;  . HOT HEMOSTASIS N/A 08/27/2020   Procedure: HOT HEMOSTASIS (ARGON PLASMA COAGULATION/BICAP);  Surgeon: Lavena Bullion, DO;  Location: [facility] ENDOSCOPY;  Service: Gastroenterology;  Laterality: N/A;  . LEFT HEART CATH AND CORONARY ANGIOGRAPHY N/A 03/25/2018   Procedure: LEFT HEART CATH AND CORONARY ANGIOGRAPHY;  Surgeon: Nigel Mormon, MD;  Location: [place] CV LAB;  Service: Cardiovascular;  Laterality: N/A;  . LEFT HEART CATHETERIZATION WITH CORONARY ANGIOGRAM N/A 09/07/2014   Procedure: LEFT HEART CATHETERIZATION WITH CORONARY ANGIOGRAM;  Surgeon: Laverda Page, MD;  Location: [facility] CATH LAB;  Service: Cardiovascular;  Laterality: N/A;  . LOWER EXTREMITY ANGIOGRAPHY  10/29/2017   Procedure: Lower Extremity Angiography;  Surgeon: Adrian Prows, MD;  Location: [place] CV LAB;  Service: Cardiovascular;;  . PERIPHERAL VASCULAR  CATHETERIZATION N/A 11/01/2015   Procedure: Renal Angiography;  Surgeon: Adrian Prows, MD;  Location: [place] CV LAB;  Service: Cardiovascular;  Laterality: N/A;  . PERIPHERAL VASCULAR CATHETERIZATION N/A 04/10/2016   Procedure: Renal Angiography;  Surgeon: Adrian Prows, MD;  Location: [place] CV LAB;  Service: Cardiovascular;  Laterality: N/A;  . PERIPHERAL VASCULAR CATHETERIZATION  04/10/2016   Procedure: Peripheral Vascular Intervention;  Surgeon: Adrian Prows, MD;  Location: [place] CV LAB;  Service: Cardiovascular;;  . PERIPHERAL VASCULAR INTERVENTION  10/29/2017   Procedure: PERIPHERAL VASCULAR INTERVENTION;  Surgeon: Adrian Prows, MD;  Location: [place] CV LAB;  Service: Cardiovascular;;  . POLYPECTOMY  08/07/2020   Procedure: POLYPECTOMY;  Surgeon: Jackquline Denmark, MD;  Location: [facility] ENDOSCOPY;  Service: Endoscopy;;  . POLYPECTOMY  08/27/2020   Procedure: POLYPECTOMY;  Surgeon: Lavena Bullion, DO;  Location: [facility] ENDOS[facility];  Service: Gastroenterology;;  . RENAL ANGIOGRAPHY N/A 10/29/2017   Procedure: RENAL ANGIOGRAPHY;  Surgeon: Adrian Prows, MD;  Location: [place] CV LAB;  Service: Cardiovascular;  Laterality: N/A;  . RENAL ANGIOGRAPHY N/A 05/10/2020   Procedure: RENAL ANGIOGRAPHY;  Surgeon: Nigel Mormon, MD;  Location: [place] CV LAB;  Service: Cardiovascular;  Laterality: N/A;  . RIGHT OOPHORECTOMY Right 1996  .  TOTAL ABDOMINAL HYSTERECTOMY  1997    Current Outpatient Medications  Medication Sig Dispense Refill  . acetaminophen (TYLENOL) 500 MG tablet Take 1,000 mg by mouth every 8 (eight) hours as needed for mild pain or headache.    . albuterol (PROVENTIL HFA;VENTOLIN HFA) 108 (90 BASE) MCG/ACT inhaler Inhale 2 puffs into the lungs 2 (two) times daily as needed for wheezing or shortness of breath (asthma).    . ALPRAZolam (XANAX) 1 MG tablet Take 0.5 tablets by mouth daily as needed for anxiety.     Marland Kitchen atenolol (TENORMIN) 50 MG tablet Take 1 tablet (50 mg total) by  mouth daily. 90 tablet 1  . atorvastatin (LIPITOR) 80 MG tablet Take 80 mg by mouth daily.     . clopidogrel (PLAVIX) 75 MG tablet Take 1 tablet (75 mg total) by mouth daily.    Marland Kitchen ezetimibe (ZETIA) 10 MG tablet Take 1 tablet (10 mg total) by mouth daily. 90 tablet 3  . isosorbide mononitrate (IMDUR) 60 MG 24 hr tablet TAKE 1 TABLET BY MOUTH DAILY 90 tablet 3  . linaclotide (LINZESS) 72 MCG capsule Take 1 capsule (72 mcg total) by mouth daily before breakfast. 30 capsule 2  . megestrol (MEGACE) 20 MG tablet Take 1 tablet by mouth daily.    . mometasone-formoterol (DULERA) 100-5 MCG/ACT AERO Take 2 puffs first thing in am and then another 2 puffs about 12 hours later. (Patient taking differently: Inhale 2 puffs into the lungs in the morning and at bedtime.) 1 Inhaler 11  . nitroGLYCERIN (NITROSTAT) 0.4 MG SL tablet Place 1 tablet (0.4 mg total) under the tongue every 5 (five) minutes as needed for chest pain. 30 tablet 1  . pantoprazole (PROTONIX) 40 MG tablet TAKE 1 TABLET BY MOUTH TWICE (2) DAILY 180 tablet 2  . polyethylene glycol (MIRALAX) 17 g packet Take 17 g by mouth daily. 14 each 0  . promethazine (PHENERGAN) 25 MG tablet Take 1 tablet by mouth daily as needed for nausea or vomiting.     . venlafaxine XR (EFFEXOR XR) 150 MG 24 hr capsule Take 1 capsule (150 mg total) by mouth daily with breakfast. 30 capsule 0  . amLODipine (NORVASC) 10 MG tablet Take 1 tablet (10 mg total) by mouth daily. 30 tablet 0  . azelastine (ASTELIN) 0.1 % nasal spray Place 1 spray into both nostrils daily as needed for rhinitis or allergies.      No current facility-administered medications for this visit.    Allergies as of 10/24/2020 - Review Complete 10/24/2020  Allergen Reaction Noted  . Doxycycline Anaphylaxis and Hives   . Hydralazine Rash 08/16/2020  . Hydrocodone-acetaminophen Nausea And Vomiting 06/18/2012  . Iohexol Itching 10/16/2011    Family History  Problem Relation Age of Onset  . Other  Mother        many bowel obstructions  . Heart disease Mother   . Colon polyps Mother   . Kidney cancer Father   . Bone cancer Father   . Diabetes Father   . Heart disease Father   . Diabetes Daughter   . Colon cancer Paternal Grandfather   . Heart disease Brother   . Esophageal cancer Neg Hx     Social History   Socioeconomic History  . Marital status: Divorced    Spouse name: Not on file  . Number of children: 1  . Years of education: Not on file  . Highest education level: Not on file  Occupational History  . Occupation:  grill cook/cashier    Employer: UNEMPLOYED  Tobacco Use  . Smoking status: Current Some Day Smoker    Packs/day: 0.25    Years: 40.00    Pack years: 10.00    Types: Cigarettes  . Smokeless tobacco: Never Used  Vaping Use  . Vaping Use: Former  Substance and Sexual Activity  . Alcohol use: No  . Drug use: Yes    Frequency: 3.0 times per week    Types: Marijuana    Comment: last use yesterday  . Sexual activity: Yes    Birth control/protection: Post-menopausal  Other Topics Concern  . Not on file  Social History Narrative  . Not on file   Social Determinants of Health   Financial Resource Strain: Not on file  Food Insecurity: Not on file  Transportation Needs: Not on file  Physical Activity: Not on file  Stress: Not on file  Social Connections: Not on file  Intimate Partner Violence: Not on file     Physical Exam: BP 126/70 (BP Location: Left Arm, Patient Position: Sitting, Cuff Size: Normal)   Pulse (!) 57   Ht 5' 0.25" (1.53 m)   Wt 93 lb 8 oz (42.4 kg)   BMI 18.11 kg/m  Constitutional: generally well-appearing Psychiatric: alert and oriented x3 Abdomen: soft, nontender, nondistended, no obvious ascites, no peritoneal signs, normal bowel sounds No peripheral edema noted in lower extremities  Assessment and plan: 60 y.o. female with postprandial epigastric pain, weight loss, chronic constipation, recent significant  anemia  First it is not clear to me what is causing her postprandial epigastric pain and weight loss.  She has had 2 upper endoscopies, 2 colonoscopies, 2 CT scans in the past 3 months or so.  The CT scans were without IV contrast for evolving, worsening chronic renal insufficiency.  She has not been back in to see her nephrologist 2 or 3 months.   I suppose this pain might be biliary in origin.  She has not had good imaging of her gallbladder in about 8 years and so I am ordering an abdominal ultrasound.  If there are clearly stones in her gallbladder then she might need general surgery referral to consider elective cholecystectomy.  Her worsening chronic renal sufficiency might be causing some anorexia, weight loss.  Alternatively she might have mesenteric insufficiency.  She certainly has significant peripheral vascular disease.  If the above testing is not helpful and we have no other clear cause of her postprandial abdominal pain, weight loss I will probably send her to a vascular surgeon to consider chronic mesenteric ischemia work-up.  Unclear where she was bleeding from previously.  Possibly the 2 very small intestinal AVMs were the source.  They were treated with APC cautery.  She has not had a CBC since discharge.  She has not had her iron infusions yet due to a death in the family.  She believes her PCP was giving her iron infusion set up.  I am getting a CBC today as well as a basic metabolic profile  Please see the "Patient Instructions" section for addition details about the plan.  Owens Loffler, MD [facility] 10/24/2020, 1:47 PM   Total time on date of encounter was 40 minutes (this included time spent preparing to see the patient reviewing records; obtaining and/or reviewing separately obtained history; performing a medically appropriate exam and/or evaluation; counseling and educating the patient and family if present; ordering medications, tests or procedures if  applicable; and documenting clinical information in the  health record).

## 2020-10-26 ENCOUNTER — Ambulatory Visit: Payer: Medicare Other

## 2020-10-26 ENCOUNTER — Other Ambulatory Visit: Payer: Self-pay

## 2020-10-26 DIAGNOSIS — I6523 Occlusion and stenosis of bilateral carotid arteries: Secondary | ICD-10-CM

## 2020-10-27 ENCOUNTER — Other Ambulatory Visit: Payer: Self-pay

## 2020-10-27 DIAGNOSIS — D649 Anemia, unspecified: Secondary | ICD-10-CM

## 2020-10-28 ENCOUNTER — Ambulatory Visit (HOSPITAL_COMMUNITY): Payer: Medicare Other

## 2020-10-28 ENCOUNTER — Ambulatory Visit: Payer: Medicare Other | Admitting: Physical Therapy

## 2020-10-30 ENCOUNTER — Other Ambulatory Visit: Payer: Self-pay | Admitting: Cardiology

## 2020-10-30 DIAGNOSIS — I6523 Occlusion and stenosis of bilateral carotid arteries: Secondary | ICD-10-CM

## 2020-10-31 ENCOUNTER — Telehealth: Payer: Self-pay | Admitting: Physical Therapy

## 2020-10-31 ENCOUNTER — Ambulatory Visit: Payer: Medicare Other | Admitting: Physical Therapy

## 2020-10-31 NOTE — Telephone Encounter (Signed)
LVM regarding missed appointment today. Noted when her next scheduled date/ time is and if she cannot make to call the clinic before her appointment to let us know and we can work to cancel/ reschedule that appointment for her. Advised to review our attendance policy provided in the yellow folder last session.  Verdelle Valtierra PT, DPT, LAT, ATC  10/31/20  2:53 PM

## 2020-11-03 ENCOUNTER — Ambulatory Visit (HOSPITAL_COMMUNITY): Admission: RE | Admit: 2020-11-03 | Payer: Medicare Other | Source: Ambulatory Visit

## 2020-11-08 ENCOUNTER — Ambulatory Visit: Payer: Medicare Other | Attending: Family Medicine | Admitting: Physical Therapy

## 2020-11-08 ENCOUNTER — Telehealth: Payer: Self-pay | Admitting: Physical Therapy

## 2020-11-08 NOTE — Telephone Encounter (Signed)
LVM regarding today's missed appointment. Today is her 2nd missed appointment which per policy we are required to cancel future appointments and schedule only 1 at a time. I noted/ repeated the date and time of the next appointment. If she is unable to make it to call and we can work to cancel/ reschedule that for her. If she misses that appointment it will mark her 3rd missed appointment which is grounds for discharge. She can find the attendance policy in the folder provided at evaluation.  [name] PT, DPT, LAT, ATC  11/08/20  2:48 PM

## 2020-11-10 ENCOUNTER — Other Ambulatory Visit: Payer: Self-pay

## 2020-11-10 MED ORDER — AMLODIPINE BESYLATE 10 MG PO TABS: 10.0000 mg | ORAL_TABLET | Freq: Every day | ORAL | 0 refills | Status: DC

## 2020-11-15 ENCOUNTER — Ambulatory Visit: Payer: Medicare Other | Admitting: Physical Therapy

## 2020-11-22 ENCOUNTER — Encounter: Payer: Medicare Other | Admitting: Physical Therapy

## 2020-11-23 ENCOUNTER — Other Ambulatory Visit: Payer: Self-pay

## 2020-11-23 ENCOUNTER — Ambulatory Visit (HOSPITAL_COMMUNITY)
Admission: RE | Admit: 2020-11-23 | Discharge: 2020-11-23 | Disposition: A | Discharge status: Home / Self Care | Payer: Medicare Other | Source: Ambulatory Visit | Attending: Gastroenterology | Admitting: Gastroenterology

## 2020-11-23 DIAGNOSIS — R1013 Epigastric pain: Secondary | ICD-10-CM | POA: Insufficient documentation

## 2020-11-29 ENCOUNTER — Encounter: Payer: Medicare Other | Admitting: Physical Therapy

## 2020-11-29 ENCOUNTER — Other Ambulatory Visit: Payer: Self-pay

## 2020-11-29 DIAGNOSIS — I7090 Unspecified atherosclerosis: Secondary | ICD-10-CM

## 2020-12-13 ENCOUNTER — Other Ambulatory Visit: Payer: Self-pay | Admitting: Cardiology

## 2020-12-15 ENCOUNTER — Other Ambulatory Visit: Payer: Self-pay

## 2020-12-15 DIAGNOSIS — R1084 Generalized abdominal pain: Secondary | ICD-10-CM

## 2020-12-20 ENCOUNTER — Ambulatory Visit (HOSPITAL_COMMUNITY)
Admission: RE | Admit: 2020-12-20 | Discharge: 2020-12-20 | Disposition: A | Discharge status: Home / Self Care | Payer: Medicare Other | Source: Ambulatory Visit | Attending: Vascular Surgery | Admitting: Vascular Surgery

## 2020-12-20 ENCOUNTER — Encounter: Payer: Self-pay | Admitting: Vascular Surgery

## 2020-12-20 ENCOUNTER — Ambulatory Visit (INDEPENDENT_AMBULATORY_CARE_PROVIDER_SITE_OTHER): Payer: Medicare Other | Admitting: Vascular Surgery

## 2020-12-20 ENCOUNTER — Other Ambulatory Visit: Payer: Self-pay

## 2020-12-20 VITALS — BP 161/93 | HR 63 | Temp 97.9°F | Resp 20 | Ht 60.0 in | Wt 90.0 lb

## 2020-12-20 DIAGNOSIS — K551 Chronic vascular disorders of intestine: Secondary | ICD-10-CM | POA: Diagnosis not present

## 2020-12-20 DIAGNOSIS — R1084 Generalized abdominal pain: Secondary | ICD-10-CM | POA: Diagnosis present

## 2020-12-20 NOTE — H&P (View-Only) (Signed)
[facility] [place]  ASSESSMENT / PLAN: 60 y.o. female with chronic mesenteric ischemia.  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  Unfortunately she has CKD 5.  The conversation about the options for mesenteric revascularization, including open revascularization and endovascular therapies.  I explained that I will need to use a small volume of iodinated contrast if I am able to intervene.  This exposes her to risk of further renal deterioration and dialysis.  She is understanding and wishes to proceed.  Recommend the following which can slow the progression of atherosclerosis and reduce the risk of major adverse cardiac / limb events:  Complete cessation from all tobacco products. Blood glucose control with goal A1c < 7%. Blood pressure control with goal blood pressure < 140/90 mmHg. Lipid reduction therapy with goal LDL-C <100 mg/dL (<70 if symptomatic from PAD).  Aspirin 81mg  PO QD.  Clopidogrel 75mg  PO QD. Atorvastatin 40-80mg  PO QD (or other "high intensity" statin therapy).  Plan abdominal aortogram with SMA intervention via left common femoral artery access 12/28/2020.  We will plan to use as much CO2 contrast as possible to accomplish the procedure  CHIEF COMPLAINT: Abdominal pain  HISTORY OF PRESENT ILLNESS: [name] is a 60 y.o. female referred to clinic for evaluation of possible chronic mesenteric ischemia.  She has had a thorough work-up by gastroenterologist for constant generalized abdominal pain.  The pain is exacerbated by eating.  She reports she has lost nearly 30 pounds over the past year.  She reports she is afraid to eat.  VASCULAR SURGICAL HISTORY:  Bilateral renal artery stenting by Dr. Einar Gip intervention 05/10/2020 Distal aortic stenting by Dr. Einar Gip with 10 x 29 Omnilink postdilated to 12 mm 10/29/2017  VASCULAR RISK FACTORS: Patient reports: -No history of  cerebrovascular disease / stroke / transient ischemic attack. - No history of coronary artery disease. Coronary cath June 2019 unremarkable. - No history of diabetes mellitus (No results found for: HGBA1C). - Lifelong history of smoking. Actively smoking. - + history of hypertension.  - + history of chronic kidney disease (GFR 28). - + history of chronic obstructive pulmonary disease - HF with preserved ejection fraction (EF 60% January 2021).   Past Medical History:  Diagnosis Date  . Anemia 2008  . Anxiety   . Aortic stenosis   . Arthritis    "all my joints ache" (09/07/2014)  . Asthma   . Bipolar 1 disorder ([place])   . Bradycardia   . Bruit   . Carpal tunnel syndrome   . Cervical cancer ([place]) 1985  . CHF (congestive heart failure) ([place])   . Chronic kidney disease (CKD), stage V ([place])   . COPD (chronic obstructive pulmonary disease) ([place]) 2000  . Coronary artery disease   . Daily headache   . Depression 2000  . Diverticulitis 2008  . GERD (gastroesophageal reflux disease)   . Glaucoma   . Heart murmur   . History of colon polyps 2009  . HLD (hyperlipidemia) 2013  . Hypertension 2013  . Hypovitaminosis D   . IBS (irritable bowel syndrome) 2008  . Myocardial infarction ([place])    2015  . PAD (peripheral artery disease) ([place])   . Pancreatitis 10/2011  . Pneumonia 07/2014  . RLS (restless legs syndrome)   . Schizophrenia ([place])   . Shortness of breath   . Skin cancer   . Small bowel obstruction ([place]) 2008    Past Surgical History:  Procedure  Laterality Date  . ABDOMINAL ANGIOGRAM N/A 09/07/2014   Procedure: ABDOMINAL ANGIOGRAM;  Surgeon: Laverda Page, MD;  Location: [facility] CATH LAB;  Service: Cardiovascular;  Laterality: N/A;  . APPENDECTOMY  1996  . BIOPSY  08/06/2020   Procedure: BIOPSY;  Surgeon: Jackquline Denmark, MD;  Location: [facility] ENDOSCOPY;  Service: Endoscopy;;  . COLON SURGERY  2008   6 inches of colon removed due to obstruction  . COLONOSCOPY WITH PROPOFOL N/A  10/25/2016   Procedure: COLONOSCOPY WITH PROPOFOL;  Surgeon: Milus Banister, MD;  Location: [facility] ENDOSCOPY;  Service: Endoscopy;  Laterality: N/A;  . COLONOSCOPY WITH PROPOFOL N/A 08/07/2020   Procedure: COLONOSCOPY WITH PROPOFOL;  Surgeon: Jackquline Denmark, MD;  Location: [facility] ENDOSCOPY;  Service: Endoscopy;  Laterality: N/A;  . COLONOSCOPY WITH PROPOFOL N/A 08/27/2020   Procedure: COLONOSCOPY WITH PROPOFOL;  Surgeon: Lavena Bullion, DO;  Location: [facility] ENDOSCOPY;  Service: Gastroenterology;  Laterality: N/A;  . CORONARY ANGIOPLASTY WITH STENT PLACEMENT  09/07/2014   "2"  . ENTEROSCOPY N/A 08/27/2020   Procedure: ENTEROSCOPY;  Surgeon: Lavena Bullion, DO;  Location: [facility] ENDOSCOPY;  Service: Gastroenterology;  Laterality: N/A;  Push enteroscopy   . ESOPHAGOGASTRODUODENOSCOPY (EGD) WITH PROPOFOL N/A 08/06/2020   Procedure: ESOPHAGOGASTRODUODENOSCOPY (EGD) WITH PROPOFOL;  Surgeon: Jackquline Denmark, MD;  Location: [facility] ENDOS[facility];  Service: Endoscopy;  Laterality: N/A;  . GIVENS CAPSULE STUDY N/A 08/24/2020   Procedure: GIVENS CAPSULE STUDY;  Surgeon: Lavena Bullion, DO;  Location: [facility] ENDOS[facility];  Service: Gastroenterology;  Laterality: N/A;  . HOT HEMOSTASIS N/A 08/07/2020   Procedure: HOT HEMOSTASIS (ARGON PLASMA COAGULATION/BICAP);  Surgeon: Jackquline Denmark, MD;  Location: [facility] ENDOSCOPY;  Service: Endoscopy;  Laterality: N/A;  . HOT HEMOSTASIS N/A 08/27/2020   Procedure: HOT HEMOSTASIS (ARGON PLASMA COAGULATION/BICAP);  Surgeon: Lavena Bullion, DO;  Location: [facility] ENDOS[facility];  Service: Gastroenterology;  Laterality: N/A;  . LEFT HEART CATH AND CORONARY ANGIOGRAPHY N/A 03/25/2018   Procedure: LEFT HEART CATH AND CORONARY ANGIOGRAPHY;  Surgeon: Nigel Mormon, MD;  Location: [facility] CV LAB;  Service: Cardiovascular;  Laterality: N/A;  . LEFT HEART CATHETERIZATION WITH CORONARY ANGIOGRAM N/A 09/07/2014   Procedure: LEFT HEART CATHETERIZATION WITH CORONARY ANGIOGRAM;  Surgeon: Laverda Page, MD;   Location: [facility] CATH LAB;  Service: Cardiovascular;  Laterality: N/A;  . LOWER EXTREMITY ANGIOGRAPHY  10/29/2017   Procedure: Lower Extremity Angiography;  Surgeon: Adrian Prows, MD;  Location: [facility] CV LAB;  Service: Cardiovascular;;  . PERIPHERAL VASCULAR CATHETERIZATION N/A 11/01/2015   Procedure: Renal Angiography;  Surgeon: Adrian Prows, MD;  Location: [facility] CV LAB;  Service: Cardiovascular;  Laterality: N/A;  . PERIPHERAL VASCULAR CATHETERIZATION N/A 04/10/2016   Procedure: Renal Angiography;  Surgeon: Adrian Prows, MD;  Location: [facility] CV LAB;  Service: Cardiovascular;  Laterality: N/A;  . PERIPHERAL VASCULAR CATHETERIZATION  04/10/2016   Procedure: Peripheral Vascular Intervention;  Surgeon: Adrian Prows, MD;  Location: [facility] CV LAB;  Service: Cardiovascular;;  . PERIPHERAL VASCULAR INTERVENTION  10/29/2017   Procedure: PERIPHERAL VASCULAR INTERVENTION;  Surgeon: Adrian Prows, MD;  Location: [facility] CV LAB;  Service: Cardiovascular;;  . POLYPECTOMY  08/07/2020   Procedure: POLYPECTOMY;  Surgeon: Jackquline Denmark, MD;  Location: [facility]OS[facility];  Service: Endoscopy;;  . POLYPECTOMY  08/27/2020   Procedure: POLYPECTOMY;  Surgeon: Lavena Bullion, DO;  Location: [facility]OS[facility];  Service: Gastroenterology;;  . RENAL ANGIOGRAPHY N/A 10/29/2017   Procedure: RENAL ANGIOGRAPHY;  Surgeon: Adrian Prows, MD;  Location: [facility] CV LAB;  Service: Cardiovascular;  Laterality: N/A;  . RENAL ANGIOGRAPHY N/A 05/10/2020   Procedure: RENAL ANGIOGRAPHY;  Surgeon: Nigel Mormon, MD;  Location: [facility] CV LAB;  Service: Cardiovascular;  Laterality: N/A;  . RIGHT OOPHORECTOMY Right 1996  . TOTAL ABDOMINAL HYSTERECTOMY  1997    Family History  Problem Relation Age of Onset  . Other Mother        many bowel obstructions  . Heart disease Mother   . Colon polyps Mother   . Kidney cancer Father   . Bone cancer Father   . Diabetes Father   . Heart disease Father   . Diabetes Daughter   .  Colon cancer Paternal Grandfather   . Heart disease Brother   . Esophageal cancer Neg Hx     Social History   Socioeconomic History  . Marital status: Divorced    Spouse name: Not on file  . Number of children: 1  . Years of education: Not on file  . Highest education level: Not on file  Occupational History  . Occupation: Scientist, research (life sciences): UNEMPLOYED  Tobacco Use  . Smoking status: Current Some Day Smoker    Packs/day: 1.00    Years: 40.00    Pack years: 40.00    Types: Cigarettes  . Smokeless tobacco: Never Used  Vaping Use  . Vaping Use: Former  Substance and Sexual Activity  . Alcohol use: No  . Drug use: Yes    Frequency: 3.0 times per week    Types: Marijuana    Comment: last use yesterday  . Sexual activity: Yes    Birth control/protection: Post-menopausal  Other Topics Concern  . Not on file  Social History Narrative  . Not on file   Social Determinants of Health   Financial Resource Strain: Not on file  Food Insecurity: Not on file  Transportation Needs: Not on file  Physical Activity: Not on file  Stress: Not on file  Social Connections: Not on file  Intimate Partner Violence: Not on file    Allergies  Allergen Reactions  . Doxycycline Anaphylaxis and Hives  . Hydralazine Rash  . Hydrocodone-Acetaminophen Nausea And Vomiting  . Iohexol Itching    Pt has itching nose after iv contrast injection    Current Outpatient Medications  Medication Sig Dispense Refill  . acetaminophen (TYLENOL) 500 MG tablet Take 1,000 mg by mouth every 8 (eight) hours as needed for mild pain or headache.    . albuterol (PROVENTIL HFA;VENTOLIN HFA) 108 (90 BASE) MCG/ACT inhaler Inhale 2 puffs into the lungs 2 (two) times daily as needed for wheezing or shortness of breath (asthma).    . ALPRAZolam (XANAX) 1 MG tablet Take 0.5 tablets by mouth daily as needed for anxiety.     Marland Kitchen amLODipine (NORVASC) 10 MG tablet TAKE 1 TABLET BY MOUTH ONCE DAILY 30 tablet 3   . atenolol (TENORMIN) 50 MG tablet Take 1 tablet (50 mg total) by mouth daily. 90 tablet 1  . atorvastatin (LIPITOR) 80 MG tablet Take 80 mg by mouth daily.     . clopidogrel (PLAVIX) 75 MG tablet Take 1 tablet (75 mg total) by mouth daily.    Marland Kitchen ezetimibe (ZETIA) 10 MG tablet Take 1 tablet (10 mg total) by mouth daily. 90 tablet 3  . isosorbide mononitrate (IMDUR) 60 MG 24 hr tablet TAKE 1 TABLET BY MOUTH DAILY 90 tablet 3  . linaclotide (LINZESS) 72 MCG capsule Take 1 capsule (72 mcg total) by mouth daily before breakfast.  30 capsule 2  . megestrol (MEGACE) 20 MG tablet Take 1 tablet by mouth daily.    . mometasone-formoterol (DULERA) 100-5 MCG/ACT AERO Take 2 puffs first thing in am and then another 2 puffs about 12 hours later. (Patient taking differently: Inhale 2 puffs into the lungs in the morning and at bedtime.) 1 Inhaler 11  . nitroGLYCERIN (NITROSTAT) 0.4 MG SL tablet Place 1 tablet (0.4 mg total) under the tongue every 5 (five) minutes as needed for chest pain. 30 tablet 1  . pantoprazole (PROTONIX) 40 MG tablet TAKE 1 TABLET BY MOUTH TWICE (2) DAILY 180 tablet 2  . polyethylene glycol (MIRALAX) 17 g packet Take 17 g by mouth daily. 14 each 0  . promethazine (PHENERGAN) 25 MG tablet Take 1 tablet by mouth daily as needed for nausea or vomiting.     . venlafaxine XR (EFFEXOR XR) 150 MG 24 hr capsule Take 1 capsule (150 mg total) by mouth daily with breakfast. 30 capsule 0  . azelastine (ASTELIN) 0.1 % nasal spray Place 1 spray into both nostrils daily as needed for rhinitis or allergies.      No current facility-administered medications for this visit.    REVIEW OF SYSTEMS:  [X]  denotes positive finding, [ ]  denotes negative finding Cardiac  Comments:  Chest pain or chest pressure:    Shortness of breath upon exertion:    Short of breath when lying flat:    Irregular heart rhythm:        Vascular    Pain in calf, thigh, or hip brought on by ambulation:    Pain in feet at night  that wakes you up from your sleep:     Blood clot in your veins:    Leg swelling:         Pulmonary    Oxygen at home:    Productive cough:     Wheezing:         Neurologic    Sudden weakness in arms or legs:     Sudden numbness in arms or legs:     Sudden onset of difficulty speaking or slurred speech:    Temporary loss of vision in one eye:     Problems with dizziness:         Gastrointestinal    Blood in stool:     Vomited blood:         Genitourinary    Burning when urinating:     Blood in urine:        Psychiatric    Major depression:         Hematologic    Bleeding problems:    Problems with blood clotting too easily:        Skin    Rashes or ulcers:        Constitutional    Fever or chills:      PHYSICAL EXAM  Vitals:   12/20/20 0916  BP: (!) 161/93  Pulse: 63  Resp: 20  Temp: 97.9 F (36.6 C)  SpO2: 97%  Weight: 90 lb (40.8 kg)  Height: 5' (1.524 m)    Constitutional: Chronically ill appearing. No distress. Appears under nourished.  Neurologic: CN intact. no focal findings. no sensory loss. Psychiatric: Mood and affect symmetric and appropriate. Eyes: No icterus. No conjunctival pallor. Ears, nose, throat: mucous membranes moist. Midline trachea.  Cardiac: regular rate and rhythm.  Respiratory: unlabored. Abdominal: soft, non-tender, non-distended.  Peripheral vascular:  2+ L radial and brachial pulse  1+ R femoral pulse  1+ L femoral pulse Extremity: No edema. No cyanosis. No pallor.  Skin: No gangrene. No ulceration.  Lymphatic: No Stemmer's sign. No palpable lymphadenopathy.  PERTINENT LABORATORY AND RADIOLOGIC DATA  Most recent CBC CBC Latest Ref Rng & Units 10/24/2020 08/27/2020 08/26/2020  WBC 4.0 - 10.5 K/uL 7.1 8.5 -  Hemoglobin 12.0 - 15.0 g/dL 11.5(L) 9.0(L) 9.4(L)  Hematocrit 36.0 - 46.0 % 35.5(L) 30.3(L) 30.4(L)  Platelets 150.0 - 400.0 K/uL 555.0(H) 311 -     Most recent CMP CMP Latest Ref Rng & Units 10/24/2020  08/27/2020 08/25/2020  Glucose 70 - 99 mg/dL 92 87 100(H)  BUN 6 - 23 mg/dL 14 13 14   Creatinine 0.40 - 1.20 mg/dL 2.13(H) 2.02(H) 1.80(H)  Sodium 135 - 145 mEq/L 134(L) 141 136  Potassium 3.5 - 5.1 mEq/L 4.2 4.6 3.7  Chloride 96 - 112 mEq/L 102 103 102  CO2 19 - 32 mEq/L 25 24 24   Calcium 8.4 - 10.5 mg/dL 9.1 8.9 8.9  Total Protein 6.5 - 8.1 g/dL - 6.7 -  Total Bilirubin 0.3 - 1.2 mg/dL - 0.9 -  Alkaline Phos 38 - 126 U/L - 92 -  AST 15 - 41 U/L - 29 -  ALT 0 - 44 U/L - 14 -    Renal function CrCl cannot be calculated (Patient's most recent lab result is older than the maximum 21 days allowed.).  No results found for: HGBA1C  LDL Chol Calc (NIH)  Date Value Ref Range Status  03/31/2020 51 0 - 99 mg/dL Final       Yevonne Aline. Stanford Breed, MD [facility] Phone Number: [phone] 12/20/2020 10:08 AM

## 2020-12-20 NOTE — Progress Notes (Signed)
[facility] OF [place]  ASSESSMENT / PLAN: 60 y.o. female with chronic mesenteric ischemia.  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  Unfortunately she has CKD 5.  The conversation about the options for mesenteric revascularization, including open revascularization and endovascular therapies.  I explained that I will need to use a small volume of iodinated contrast if I am able to intervene.  This exposes her to risk of further renal deterioration and dialysis.  She is understanding and wishes to proceed.  Recommend the following which can slow the progression of atherosclerosis and reduce the risk of major adverse cardiac / limb events:  Complete cessation from all tobacco products. Blood glucose control with goal A1c < 7%. Blood pressure control with goal blood pressure < 140/90 mmHg. Lipid reduction therapy with goal LDL-C <100 mg/dL (<70 if symptomatic from PAD).  Aspirin 81mg  PO QD.  Clopidogrel 75mg  PO QD. Atorvastatin 40-80mg  PO QD (or other "high intensity" statin therapy).  Plan abdominal aortogram with SMA intervention via left common femoral artery access 12/28/2020.  We will plan to use as much CO2 contrast as possible to accomplish the procedure  CHIEF COMPLAINT: Abdominal pain  HISTORY OF PRESENT ILLNESS: [name] is a 60 y.o. female referred to clinic for evaluation of possible chronic mesenteric ischemia.  She has had a thorough work-up by gastroenterologist for constant generalized abdominal pain.  The pain is exacerbated by eating.  She reports she has lost nearly 30 pounds over the past year.  She reports she is afraid to eat.  VASCULAR SURGICAL HISTORY:  Bilateral renal artery stenting by Dr. Einar Gip intervention 05/10/2020 Distal aortic stenting by Dr. Einar Gip with 10 x 29 Omnilink postdilated to 12 mm 10/29/2017  VASCULAR RISK FACTORS: Patient reports: -No history of  cerebrovascular disease / stroke / transient ischemic attack. - No history of coronary artery disease. Coronary cath June 2019 unremarkable. - No history of diabetes mellitus (No results found for: HGBA1C). - Lifelong history of smoking. Actively smoking. - + history of hypertension.  - + history of chronic kidney disease (GFR 28). - + history of chronic obstructive pulmonary disease - HF with preserved ejection fraction (EF 60% January 2021).   Past Medical History:  Diagnosis Date  . Anemia 2008  . Anxiety   . Aortic stenosis   . Arthritis    "all my joints ache" (09/07/2014)  . Asthma   . Bipolar 1 disorder ([place])   . Bradycardia   . Bruit   . Carpal tunnel syndrome   . Cervical cancer ([place]) 1985  . CHF (congestive heart failure) ([place])   . Chronic kidney disease (CKD), stage V ([place])   . COPD (chronic obstructive pulmonary disease) ([place]) 2000  . Coronary artery disease   . Daily headache   . Depression 2000  . Diverticulitis 2008  . GERD (gastroesophageal reflux disease)   . Glaucoma   . Heart murmur   . History of colon polyps 2009  . HLD (hyperlipidemia) 2013  . Hypertension 2013  . Hypovitaminosis D   . IBS (irritable bowel syndrome) 2008  . Myocardial infarction ([place])    2015  . PAD (peripheral artery disease) ([place])   . Pancreatitis 10/2011  . Pneumonia 07/2014  . RLS (restless legs syndrome)   . Schizophrenia ([place])   . Shortness of breath   . Skin cancer   . Small bowel obstruction ([place]) 2008    Past Surgical History:  Procedure  Laterality Date  . ABDOMINAL ANGIOGRAM N/A 09/07/2014   Procedure: ABDOMINAL ANGIOGRAM;  Surgeon: Laverda Page, MD;  Location[facility] CATH LAB;  Service: Cardiovascular;  Laterality: N/A;  . APPENDECTOMY  1996  . BIOPSY  08/06/2020   Procedure: BIOPSY;  Surgeon: Jackquline Denmark, MD;  Location: [facility] ENDOSCOPY;  Service: Endoscopy;;  . COLON SURGERY  2008   6 inches of colon removed due to obstruction  . COLONOSCOPY WITH PROPOFOL N/A  10/25/2016   Procedure: COLONOSCOPY WITH PROPOFOL;  Surgeon: Milus Banister, MD;  Location: [facility] ENDOSCOPY;  Service: Endoscopy;  Laterality: N/A;  . COLONOSCOPY WITH PROPOFOL N/A 08/07/2020   Procedure: COLONOSCOPY WITH PROPOFOL;  Surgeon: Jackquline Denmark, MD;  Location: [facility] ENDOSCOPY;  Service: Endoscopy;  Laterality: N/A;  . COLONOSCOPY WITH PROPOFOL N/A 08/27/2020   Procedure: COLONOSCOPY WITH PROPOFOL;  Surgeon: Lavena Bullion, DO;  Location: [facility] ENDOSCOPY;  Service: Gastroenterology;  Laterality: N/A;  . CORONARY ANGIOPLASTY WITH STENT PLACEMENT  09/07/2014   "2"  . ENTEROSCOPY N/A 08/27/2020   Procedure: ENTEROSCOPY;  Surgeon: Lavena Bullion, DO;  Location: [facility] ENDOSCOPY;  Service: Gastroenterology;  Laterality: N/A;  Push enteroscopy   . ESOPHAGOGASTRODUODENOSCOPY (EGD) WITH PROPOFOL N/A 08/06/2020   Procedure: ESOPHAGOGASTRODUODENOSCOPY (EGD) WITH PROPOFOL;  Surgeon: Jackquline Denmark, MD;  Location: [facility] ENDOSCOPY;  Service: Endoscopy;  Laterality: N/A;  . GIVENS CAPSULE STUDY N/A 08/24/2020   Procedure: GIVENS CAPSULE STUDY;  Surgeon: Lavena Bullion, DO;  Location: [facility] ENDOSCOPY;  Service: Gastroenterology;  Laterality: N/A;  . HOT HEMOSTASIS N/A 08/07/2020   Procedure: HOT HEMOSTASIS (ARGON PLASMA COAGULATION/BICAP);  Surgeon: Jackquline Denmark, MD;  Location: [facility] ENDOSCOPY;  Service: Endoscopy;  Laterality: N/A;  . HOT HEMOSTASIS N/A 08/27/2020   Procedure: HOT HEMOSTASIS (ARGON PLASMA COAGULATION/BICAP);  Surgeon: Lavena Bullion, DO;  Location: [facility] ENDOSCOPY;  Service: Gastroenterology;  Laterality: N/A;  . LEFT HEART CATH AND CORONARY ANGIOGRAPHY N/A 03/25/2018   Procedure: LEFT HEART CATH AND CORONARY ANGIOGRAPHY;  Surgeon: Nigel Mormon, MD;  Location: [place] CV LAB;  Service: Cardiovascular;  Laterality: N/A;  . LEFT HEART CATHETERIZATION WITH CORONARY ANGIOGRAM N/A 09/07/2014   Procedure: LEFT HEART CATHETERIZATION WITH CORONARY ANGIOGRAM;  Surgeon: Laverda Page, MD;   Location: [facility] CATH LAB;  Service: Cardiovascular;  Laterality: N/A;  . LOWER EXTREMITY ANGIOGRAPHY  10/29/2017   Procedure: Lower Extremity Angiography;  Surgeon: Adrian Prows, MD;  Location: [place] CV LAB;  Service: Cardiovascular;;  . PERIPHERAL VASCULAR CATHETERIZATION N/A 11/01/2015   Procedure: Renal Angiography;  Surgeon: Adrian Prows, MD;  Location: [place] CV LAB;  Service: Cardiovascular;  Laterality: N/A;  . PERIPHERAL VASCULAR CATHETERIZATION N/A 04/10/2016   Procedure: Renal Angiography;  Surgeon: Adrian Prows, MD;  Location: [place] CV LAB;  Service: Cardiovascular;  Laterality: N/A;  . PERIPHERAL VASCULAR CATHETERIZATION  04/10/2016   Procedure: Peripheral Vascular Intervention;  Surgeon: Adrian Prows, MD;  Location: [place] CV LAB;  Service: Cardiovascular;;  . PERIPHERAL VASCULAR INTERVENTION  10/29/2017   Procedure: PERIPHERAL VASCULAR INTERVENTION;  Surgeon: Adrian Prows, MD;  Location: [place] CV LAB;  Service: Cardiovascular;;  . POLYPECTOMY  08/07/2020   Procedure: POLYPECTOMY;  Surgeon: Jackquline Denmark, MD;  Location: [facility] ENDOSCOPY;  Service: Endoscopy;;  . POLYPECTOMY  08/27/2020   Procedure: POLYPECTOMY;  Surgeon: Lavena Bullion, DO;  Location: [facility] ENDOSCOPY;  Service: Gastroenterology;;  . RENAL ANGIOGRAPHY N/A 10/29/2017   Procedure: RENAL ANGIOGRAPHY;  Surgeon: Adrian Prows, MD;  Location: [place] CV LAB;  Service: Cardiovascular;  Laterality: N/A;  . RENAL ANGIOGRAPHY N/A 05/10/2020   Procedure: RENAL ANGIOGRAPHY;  Surgeon: Nigel Mormon, MD;  Location: [place] CV LAB;  Service: Cardiovascular;  Laterality: N/A;  . RIGHT OOPHORECTOMY Right 1996  . TOTAL ABDOMINAL HYSTERECTOMY  1997    Family History  Problem Relation Age of Onset  . Other Mother        many bowel obstructions  . Heart disease Mother   . Colon polyps Mother   . Kidney cancer Father   . Bone cancer Father   . Diabetes Father   . Heart disease Father   . Diabetes Daughter   .  Colon cancer Paternal Grandfather   . Heart disease Brother   . Esophageal cancer Neg Hx     Social History   Socioeconomic History  . Marital status: Divorced    Spouse name: Not on file  . Number of children: 1  . Years of education: Not on file  . Highest education level: Not on file  Occupational History  . Occupation: Scientist, research (life sciences): UNEMPLOYED  Tobacco Use  . Smoking status: Current Some Day Smoker    Packs/day: 1.00    Years: 40.00    Pack years: 40.00    Types: Cigarettes  . Smokeless tobacco: Never Used  Vaping Use  . Vaping Use: Former  Substance and Sexual Activity  . Alcohol use: No  . Drug use: Yes    Frequency: 3.0 times per week    Types: Marijuana    Comment: last use yesterday  . Sexual activity: Yes    Birth control/protection: Post-menopausal  Other Topics Concern  . Not on file  Social History Narrative  . Not on file   Social Determinants of Health   Financial Resource Strain: Not on file  Food Insecurity: Not on file  Transportation Needs: Not on file  Physical Activity: Not on file  Stress: Not on file  Social Connections: Not on file  Intimate Partner Violence: Not on file    Allergies  Allergen Reactions  . Doxycycline Anaphylaxis and Hives  . Hydralazine Rash  . Hydrocodone-Acetaminophen Nausea And Vomiting  . Iohexol Itching    Pt has itching nose after iv contrast injection    Current Outpatient Medications  Medication Sig Dispense Refill  . acetaminophen (TYLENOL) 500 MG tablet Take 1,000 mg by mouth every 8 (eight) hours as needed for mild pain or headache.    . albuterol (PROVENTIL HFA;VENTOLIN HFA) 108 (90 BASE) MCG/ACT inhaler Inhale 2 puffs into the lungs 2 (two) times daily as needed for wheezing or shortness of breath (asthma).    . ALPRAZolam (XANAX) 1 MG tablet Take 0.5 tablets by mouth daily as needed for anxiety.     Marland Kitchen amLODipine (NORVASC) 10 MG tablet TAKE 1 TABLET BY MOUTH ONCE DAILY 30 tablet 3   . atenolol (TENORMIN) 50 MG tablet Take 1 tablet (50 mg total) by mouth daily. 90 tablet 1  . atorvastatin (LIPITOR) 80 MG tablet Take 80 mg by mouth daily.     . clopidogrel (PLAVIX) 75 MG tablet Take 1 tablet (75 mg total) by mouth daily.    Marland Kitchen ezetimibe (ZETIA) 10 MG tablet Take 1 tablet (10 mg total) by mouth daily. 90 tablet 3  . isosorbide mononitrate (IMDUR) 60 MG 24 hr tablet TAKE 1 TABLET BY MOUTH DAILY 90 tablet 3  . linaclotide (LINZESS) 72 MCG capsule Take 1 capsule (72 mcg total) by mouth daily before breakfast.  30 capsule 2  . megestrol (MEGACE) 20 MG tablet Take 1 tablet by mouth daily.    . mometasone-formoterol (DULERA) 100-5 MCG/ACT AERO Take 2 puffs first thing in am and then another 2 puffs about 12 hours later. (Patient taking differently: Inhale 2 puffs into the lungs in the morning and at bedtime.) 1 Inhaler 11  . nitroGLYCERIN (NITROSTAT) 0.4 MG SL tablet Place 1 tablet (0.4 mg total) under the tongue every 5 (five) minutes as needed for chest pain. 30 tablet 1  . pantoprazole (PROTONIX) 40 MG tablet TAKE 1 TABLET BY MOUTH TWICE (2) DAILY 180 tablet 2  . polyethylene glycol (MIRALAX) 17 g packet Take 17 g by mouth daily. 14 each 0  . promethazine (PHENERGAN) 25 MG tablet Take 1 tablet by mouth daily as needed for nausea or vomiting.     . venlafaxine XR (EFFEXOR XR) 150 MG 24 hr capsule Take 1 capsule (150 mg total) by mouth daily with breakfast. 30 capsule 0  . azelastine (ASTELIN) 0.1 % nasal spray Place 1 spray into both nostrils daily as needed for rhinitis or allergies.      No current facility-administered medications for this visit.    REVIEW OF SYSTEMS:  [X]  denotes positive finding, [ ]  denotes negative finding Cardiac  Comments:  Chest pain or chest pressure:    Shortness of breath upon exertion:    Short of breath when lying flat:    Irregular heart rhythm:        Vascular    Pain in calf, thigh, or hip brought on by ambulation:    Pain in feet at night  that wakes you up from your sleep:     Blood clot in your veins:    Leg swelling:         Pulmonary    Oxygen at home:    Productive cough:     Wheezing:         Neurologic    Sudden weakness in arms or legs:     Sudden numbness in arms or legs:     Sudden onset of difficulty speaking or slurred speech:    Temporary loss of vision in one eye:     Problems with dizziness:         Gastrointestinal    Blood in stool:     Vomited blood:         Genitourinary    Burning when urinating:     Blood in urine:        Psychiatric    Major depression:         Hematologic    Bleeding problems:    Problems with blood clotting too easily:        Skin    Rashes or ulcers:        Constitutional    Fever or chills:      PHYSICAL EXAM  Vitals:   12/20/20 0916  BP: (!) 161/93  Pulse: 63  Resp: 20  Temp: 97.9 F (36.6 C)  SpO2: 97%  Weight: 90 lb (40.8 kg)  Height: 5' (1.524 m)    Constitutional: Chronically ill appearing. No distress. Appears under nourished.  Neurologic: CN intact. no focal findings. no sensory loss. Psychiatric: Mood and affect symmetric and appropriate. Eyes: No icterus. No conjunctival pallor. Ears, nose, throat: mucous membranes moist. Midline trachea.  Cardiac: regular rate and rhythm.  Respiratory: unlabored. Abdominal: soft, non-tender, non-distended.  Peripheral vascular:  2+ L radial and brachial pulse  1+ R femoral pulse  1+ L femoral pulse Extremity: No edema. No cyanosis. No pallor.  Skin: No gangrene. No ulceration.  Lymphatic: No Stemmer's sign. No palpable lymphadenopathy.  PERTINENT LABORATORY AND RADIOLOGIC DATA  Most recent CBC CBC Latest Ref Rng & Units 10/24/2020 08/27/2020 08/26/2020  WBC 4.0 - 10.5 K/uL 7.1 8.5 -  Hemoglobin 12.0 - 15.0 g/dL 11.5(L) 9.0(L) 9.4(L)  Hematocrit 36.0 - 46.0 % 35.5(L) 30.3(L) 30.4(L)  Platelets 150.0 - 400.0 K/uL 555.0(H) 311 -     Most recent CMP CMP Latest Ref Rng & Units 10/24/2020  08/27/2020 08/25/2020  Glucose 70 - 99 mg/dL 92 87 100(H)  BUN 6 - 23 mg/dL 14 13 14   Creatinine 0.40 - 1.20 mg/dL 2.13(H) 2.02(H) 1.80(H)  Sodium 135 - 145 mEq/L 134(L) 141 136  Potassium 3.5 - 5.1 mEq/L 4.2 4.6 3.7  Chloride 96 - 112 mEq/L 102 103 102  CO2 19 - 32 mEq/L 25 24 24   Calcium 8.4 - 10.5 mg/dL 9.1 8.9 8.9  Total Protein 6.5 - 8.1 g/dL - 6.7 -  Total Bilirubin 0.3 - 1.2 mg/dL - 0.9 -  Alkaline Phos 38 - 126 U/L - 92 -  AST 15 - 41 U/L - 29 -  ALT 0 - 44 U/L - 14 -    Renal function CrCl cannot be calculated (Patient's most recent lab result is older than the maximum 21 days allowed.).  No results found for: HGBA1C  LDL Chol Calc (NIH)  Date Value Ref Range Status  03/31/2020 51 0 - 99 mg/dL Final       Yevonne Aline. Stanford Breed, MD [facility] Phone Number: [phone] 12/20/2020 10:08 AM

## 2020-12-26 ENCOUNTER — Other Ambulatory Visit: Payer: Self-pay

## 2020-12-26 ENCOUNTER — Other Ambulatory Visit
Admission: RE | Admit: 2020-12-26 | Discharge: 2020-12-26 | Disposition: A | Discharge status: Home / Self Care | Payer: Medicare Other | Source: Ambulatory Visit | Attending: Vascular Surgery | Admitting: Vascular Surgery

## 2020-12-26 DIAGNOSIS — Z20822 Contact with and (suspected) exposure to covid-19: Secondary | ICD-10-CM | POA: Diagnosis not present

## 2020-12-26 DIAGNOSIS — Z01812 Encounter for preprocedural laboratory examination: Secondary | ICD-10-CM | POA: Diagnosis present

## 2020-12-27 LAB — SARS CORONAVIRUS 2 (TAT 6-24 HRS): SARS Coronavirus 2: NEGATIVE

## 2020-12-28 ENCOUNTER — Inpatient Hospital Stay (HOSPITAL_COMMUNITY)
Admission: AD | Admit: 2020-12-28 | Discharge: 2020-12-30 | DRG: 253 | Disposition: A | Discharge status: Home / Self Care | Payer: Medicare Other | Attending: Vascular Surgery | Admitting: Vascular Surgery

## 2020-12-28 ENCOUNTER — Encounter (HOSPITAL_COMMUNITY): Payer: Self-pay | Admitting: Vascular Surgery

## 2020-12-28 ENCOUNTER — Ambulatory Visit (HOSPITAL_COMMUNITY)
Admission: AD | Disposition: A | Discharge status: Home / Self Care | Payer: Self-pay | Source: Home / Self Care | Attending: Vascular Surgery

## 2020-12-28 ENCOUNTER — Other Ambulatory Visit: Payer: Self-pay

## 2020-12-28 DIAGNOSIS — J449 Chronic obstructive pulmonary disease, unspecified: Secondary | ICD-10-CM | POA: Diagnosis present

## 2020-12-28 DIAGNOSIS — N185 Chronic kidney disease, stage 5: Secondary | ICD-10-CM | POA: Diagnosis present

## 2020-12-28 DIAGNOSIS — N134 Hydroureter: Secondary | ICD-10-CM

## 2020-12-28 DIAGNOSIS — Z7902 Long term (current) use of antithrombotics/antiplatelets: Secondary | ICD-10-CM

## 2020-12-28 DIAGNOSIS — Z955 Presence of coronary angioplasty implant and graft: Secondary | ICD-10-CM

## 2020-12-28 DIAGNOSIS — Z85828 Personal history of other malignant neoplasm of skin: Secondary | ICD-10-CM

## 2020-12-28 DIAGNOSIS — Z8051 Family history of malignant neoplasm of kidney: Secondary | ICD-10-CM

## 2020-12-28 DIAGNOSIS — Z90721 Acquired absence of ovaries, unilateral: Secondary | ICD-10-CM

## 2020-12-28 DIAGNOSIS — K219 Gastro-esophageal reflux disease without esophagitis: Secondary | ICD-10-CM | POA: Diagnosis present

## 2020-12-28 DIAGNOSIS — I120 Hypertensive chronic kidney disease with stage 5 chronic kidney disease or end stage renal disease: Secondary | ICD-10-CM | POA: Diagnosis present

## 2020-12-28 DIAGNOSIS — I739 Peripheral vascular disease, unspecified: Secondary | ICD-10-CM | POA: Diagnosis present

## 2020-12-28 DIAGNOSIS — I350 Nonrheumatic aortic (valve) stenosis: Secondary | ICD-10-CM | POA: Diagnosis present

## 2020-12-28 DIAGNOSIS — I708 Atherosclerosis of other arteries: Principal | ICD-10-CM | POA: Diagnosis present

## 2020-12-28 DIAGNOSIS — F319 Bipolar disorder, unspecified: Secondary | ICD-10-CM | POA: Diagnosis present

## 2020-12-28 DIAGNOSIS — G2581 Restless legs syndrome: Secondary | ICD-10-CM | POA: Diagnosis present

## 2020-12-28 DIAGNOSIS — I252 Old myocardial infarction: Secondary | ICD-10-CM

## 2020-12-28 DIAGNOSIS — I2510 Atherosclerotic heart disease of native coronary artery without angina pectoris: Secondary | ICD-10-CM | POA: Diagnosis present

## 2020-12-28 DIAGNOSIS — R112 Nausea with vomiting, unspecified: Secondary | ICD-10-CM

## 2020-12-28 DIAGNOSIS — I771 Stricture of artery: Secondary | ICD-10-CM | POA: Diagnosis present

## 2020-12-28 DIAGNOSIS — E785 Hyperlipidemia, unspecified: Secondary | ICD-10-CM | POA: Diagnosis present

## 2020-12-28 DIAGNOSIS — Z20822 Contact with and (suspected) exposure to covid-19: Secondary | ICD-10-CM | POA: Diagnosis present

## 2020-12-28 DIAGNOSIS — Z79899 Other long term (current) drug therapy: Secondary | ICD-10-CM

## 2020-12-28 DIAGNOSIS — F17210 Nicotine dependence, cigarettes, uncomplicated: Secondary | ICD-10-CM | POA: Diagnosis present

## 2020-12-28 DIAGNOSIS — Z800 Family history of malignant neoplasm of digestive organs: Secondary | ICD-10-CM

## 2020-12-28 DIAGNOSIS — I6522 Occlusion and stenosis of left carotid artery: Secondary | ICD-10-CM

## 2020-12-28 DIAGNOSIS — Z8541 Personal history of malignant neoplasm of cervix uteri: Secondary | ICD-10-CM

## 2020-12-28 DIAGNOSIS — Z90710 Acquired absence of both cervix and uterus: Secondary | ICD-10-CM

## 2020-12-28 DIAGNOSIS — K551 Chronic vascular disorders of intestine: Secondary | ICD-10-CM | POA: Diagnosis present

## 2020-12-28 DIAGNOSIS — K55059 Acute (reversible) ischemia of intestine, part and extent unspecified: Secondary | ICD-10-CM

## 2020-12-28 HISTORY — PX: ABDOMINAL AORTOGRAM W/LOWER EXTREMITY: CATH118223

## 2020-12-28 HISTORY — PX: PERIPHERAL VASCULAR INTERVENTION: CATH118257

## 2020-12-28 LAB — POCT I-STAT, CHEM 8
BUN: 22 mg/dL — ABNORMAL HIGH (ref 6–20)
Calcium, Ion: 1.14 mmol/L — ABNORMAL LOW (ref 1.15–1.40)
Chloride: 107 mmol/L (ref 98–111)
Creatinine, Ser: 2.6 mg/dL — ABNORMAL HIGH (ref 0.44–1.00)
Glucose, Bld: 95 mg/dL (ref 70–99)
HCT: 35 % — ABNORMAL LOW (ref 36.0–46.0)
Hemoglobin: 11.9 g/dL — ABNORMAL LOW (ref 12.0–15.0)
Potassium: 3.9 mmol/L (ref 3.5–5.1)
Sodium: 140 mmol/L (ref 135–145)
TCO2: 24 mmol/L (ref 22–32)

## 2020-12-28 LAB — POCT ACTIVATED CLOTTING TIME: Activated Clotting Time: 249 seconds

## 2020-12-28 LAB — CREATININE, SERUM
Creatinine, Ser: 2.67 mg/dL — ABNORMAL HIGH (ref 0.44–1.00)
GFR, Estimated: 20 mL/min — ABNORMAL LOW (ref >60)

## 2020-12-28 LAB — CBC
HCT: 39.1 % (ref 36.0–46.0)
Hemoglobin: 12.9 g/dL (ref 12.0–15.0)
MCH: 31.7 pg (ref 26.0–34.0)
MCHC: 33 g/dL (ref 30.0–36.0)
MCV: 96.1 fL (ref 80.0–100.0)
Platelets: 384 10*3/uL (ref 150–400)
RBC: 4.07 MIL/uL (ref 3.87–5.11)
RDW: 16.5 % — ABNORMAL HIGH (ref 11.5–15.5)
WBC: 9.3 10*3/uL (ref 4.0–10.5)
nRBC: 0 % (ref 0.0–0.2)

## 2020-12-28 SURGERY — ABDOMINAL AORTOGRAM W/LOWER EXTREMITY
Anesthesia: LOCAL

## 2020-12-28 MED ORDER — AZELASTINE HCL 0.1 % NA SOLN
1.0000 | Freq: Every day | NASAL | Status: DC | PRN
  Filled 2020-12-28: qty 30

## 2020-12-28 MED ORDER — EZETIMIBE 10 MG PO TABS
10.0000 mg | ORAL_TABLET | Freq: Every day | ORAL | Status: DC
  Administered 2020-12-28 – 2020-12-30 (×3): 10 mg via ORAL
  Filled 2020-12-28 (×3): qty 1

## 2020-12-28 MED ORDER — LABETALOL HCL 5 MG/ML IV SOLN
INTRAVENOUS | Status: DC | PRN
  Administered 2020-12-28: 10 mg via INTRAVENOUS

## 2020-12-28 MED ORDER — SODIUM CHLORIDE 0.9 % IV SOLN: 250.0000 mL | INTRAVENOUS | Status: DC | PRN

## 2020-12-28 MED ORDER — AMLODIPINE BESYLATE 10 MG PO TABS
10.0000 mg | ORAL_TABLET | Freq: Every day | ORAL | Status: DC
  Administered 2020-12-28 – 2020-12-30 (×3): 10 mg via ORAL
  Filled 2020-12-28 (×3): qty 1

## 2020-12-28 MED ORDER — ALPRAZOLAM 0.5 MG PO TABS
0.5000 mg | ORAL_TABLET | Freq: Two times a day (BID) | ORAL | Status: DC | PRN
  Administered 2020-12-28: 0.5 mg via ORAL
  Filled 2020-12-28: qty 1

## 2020-12-28 MED ORDER — METHYLPREDNISOLONE SODIUM SUCC 125 MG IJ SOLR
125.0000 mg | Freq: Once | INTRAMUSCULAR | Status: AC
  Administered 2020-12-28: 125 mg via INTRAVENOUS
  Filled 2020-12-28: qty 2

## 2020-12-28 MED ORDER — ASPIRIN EC 81 MG PO TBEC
81.0000 mg | DELAYED_RELEASE_TABLET | Freq: Every day | ORAL | Status: DC
  Administered 2020-12-28 – 2020-12-30 (×3): 81 mg via ORAL
  Filled 2020-12-28 (×3): qty 1

## 2020-12-28 MED ORDER — SODIUM CHLORIDE 0.9% FLUSH
3.0000 mL | Freq: Two times a day (BID) | INTRAVENOUS | Status: DC
  Administered 2020-12-28 – 2020-12-29 (×2): 3 mL via INTRAVENOUS

## 2020-12-28 MED ORDER — LINACLOTIDE 72 MCG PO CAPS
72.0000 ug | ORAL_CAPSULE | Freq: Every day | ORAL | Status: DC
  Administered 2020-12-29 – 2020-12-30 (×2): 72 ug via ORAL
  Filled 2020-12-28 (×2): qty 1

## 2020-12-28 MED ORDER — SODIUM CHLORIDE 0.9 % IV SOLN: INTRAVENOUS | Status: DC

## 2020-12-28 MED ORDER — CLOPIDOGREL BISULFATE 75 MG PO TABS
75.0000 mg | ORAL_TABLET | Freq: Every day | ORAL | Status: DC
  Administered 2020-12-28 – 2020-12-30 (×3): 75 mg via ORAL
  Filled 2020-12-28 (×3): qty 1

## 2020-12-28 MED ORDER — HEPARIN (PORCINE) IN NACL 1000-0.9 UT/500ML-% IV SOLN
INTRAVENOUS | Status: DC | PRN
  Administered 2020-12-28 (×2): 500 mL

## 2020-12-28 MED ORDER — FENTANYL CITRATE (PF) 100 MCG/2ML IJ SOLN
INTRAMUSCULAR | Status: AC
  Filled 2020-12-28: qty 2

## 2020-12-28 MED ORDER — ONDANSETRON HCL 4 MG/2ML IJ SOLN
4.0000 mg | Freq: Four times a day (QID) | INTRAMUSCULAR | Status: DC | PRN
  Administered 2020-12-29 (×2): 4 mg via INTRAVENOUS
  Filled 2020-12-28 (×2): qty 2

## 2020-12-28 MED ORDER — SODIUM CHLORIDE 0.9% FLUSH: 3.0000 mL | INTRAVENOUS | Status: DC | PRN

## 2020-12-28 MED ORDER — ACETAMINOPHEN 500 MG PO TABS: 1000.0000 mg | ORAL_TABLET | Freq: Three times a day (TID) | ORAL | Status: DC | PRN

## 2020-12-28 MED ORDER — LIDOCAINE HCL (PF) 1 % IJ SOLN
INTRAMUSCULAR | Status: DC | PRN
  Administered 2020-12-28: 30 mL via INTRADERMAL
  Administered 2020-12-28: 15 mL via INTRADERMAL

## 2020-12-28 MED ORDER — ALBUTEROL SULFATE HFA 108 (90 BASE) MCG/ACT IN AERS
2.0000 | INHALATION_SPRAY | Freq: Four times a day (QID) | RESPIRATORY_TRACT | Status: DC | PRN
  Filled 2020-12-28: qty 6.7

## 2020-12-28 MED ORDER — PROTAMINE SULFATE 10 MG/ML IV SOLN
INTRAVENOUS | Status: DC | PRN
  Administered 2020-12-28: 45 mg via INTRAVENOUS
  Administered 2020-12-28: 5 mg via INTRAVENOUS

## 2020-12-28 MED ORDER — PROMETHAZINE HCL 25 MG PO TABS
25.0000 mg | ORAL_TABLET | Freq: Every day | ORAL | Status: DC | PRN
  Administered 2020-12-29: 25 mg via ORAL
  Filled 2020-12-28 (×2): qty 1

## 2020-12-28 MED ORDER — LIDOCAINE HCL (PF) 1 % IJ SOLN
INTRAMUSCULAR | Status: AC
  Filled 2020-12-28: qty 30

## 2020-12-28 MED ORDER — MEGESTROL ACETATE 20 MG PO TABS
20.0000 mg | ORAL_TABLET | Freq: Every day | ORAL | Status: DC
  Administered 2020-12-28 – 2020-12-30 (×3): 20 mg via ORAL
  Filled 2020-12-28 (×3): qty 1

## 2020-12-28 MED ORDER — LABETALOL HCL 5 MG/ML IV SOLN
INTRAVENOUS | Status: AC
  Filled 2020-12-28: qty 4

## 2020-12-28 MED ORDER — ATENOLOL 25 MG PO TABS
50.0000 mg | ORAL_TABLET | Freq: Every day | ORAL | Status: DC
  Administered 2020-12-28 – 2020-12-30 (×3): 50 mg via ORAL
  Filled 2020-12-28 (×3): qty 2

## 2020-12-28 MED ORDER — DIPHENHYDRAMINE HCL 50 MG/ML IJ SOLN
25.0000 mg | Freq: Once | INTRAMUSCULAR | Status: AC
  Administered 2020-12-28: 25 mg via INTRAVENOUS
  Filled 2020-12-28: qty 1

## 2020-12-28 MED ORDER — MIDAZOLAM HCL 5 MG/5ML IJ SOLN
INTRAMUSCULAR | Status: AC
  Filled 2020-12-28: qty 5

## 2020-12-28 MED ORDER — ISOSORBIDE MONONITRATE ER 60 MG PO TB24
60.0000 mg | ORAL_TABLET | Freq: Every day | ORAL | Status: DC
  Administered 2020-12-28 – 2020-12-30 (×3): 60 mg via ORAL
  Filled 2020-12-28 (×3): qty 1

## 2020-12-28 MED ORDER — ATORVASTATIN CALCIUM 80 MG PO TABS
80.0000 mg | ORAL_TABLET | Freq: Every day | ORAL | Status: DC
  Administered 2020-12-28 – 2020-12-30 (×3): 80 mg via ORAL
  Filled 2020-12-28 (×3): qty 1

## 2020-12-28 MED ORDER — MORPHINE SULFATE (PF) 4 MG/ML IV SOLN: 4.0000 mg | INTRAVENOUS | Status: DC | PRN

## 2020-12-28 MED ORDER — NITROGLYCERIN 0.4 MG SL SUBL: 0.4000 mg | SUBLINGUAL_TABLET | SUBLINGUAL | Status: DC | PRN

## 2020-12-28 MED ORDER — ALPRAZOLAM 0.5 MG PO TABS
1.0000 mg | ORAL_TABLET | Freq: Two times a day (BID) | ORAL | Status: DC | PRN
  Administered 2020-12-28 – 2020-12-30 (×4): 1 mg via ORAL
  Filled 2020-12-28 (×4): qty 2

## 2020-12-28 MED ORDER — MIDAZOLAM HCL 2 MG/2ML IJ SOLN
INTRAMUSCULAR | Status: DC | PRN
  Administered 2020-12-28: 0.5 mg via INTRAVENOUS
  Administered 2020-12-28 (×2): 1 mg via INTRAVENOUS

## 2020-12-28 MED ORDER — PROTAMINE SULFATE 10 MG/ML IV SOLN
INTRAVENOUS | Status: AC
  Filled 2020-12-28: qty 5

## 2020-12-28 MED ORDER — ACETAMINOPHEN 325 MG PO TABS: 650.0000 mg | ORAL_TABLET | ORAL | Status: DC | PRN

## 2020-12-28 MED ORDER — OXYCODONE HCL 5 MG PO TABS
5.0000 mg | ORAL_TABLET | ORAL | Status: DC | PRN
  Administered 2020-12-28 (×2): 10 mg via ORAL
  Filled 2020-12-28 (×2): qty 2

## 2020-12-28 MED ORDER — ONDANSETRON HCL 4 MG/2ML IJ SOLN
INTRAMUSCULAR | Status: DC | PRN
  Administered 2020-12-28: 4 mg via INTRAVENOUS

## 2020-12-28 MED ORDER — HYDRALAZINE HCL 20 MG/ML IJ SOLN
INTRAMUSCULAR | Status: AC
  Filled 2020-12-28: qty 1

## 2020-12-28 MED ORDER — SODIUM CHLORIDE 0.9 % IV SOLN: INTRAVENOUS | Status: AC

## 2020-12-28 MED ORDER — HEPARIN SODIUM (PORCINE) 1000 UNIT/ML IJ SOLN
INTRAMUSCULAR | Status: AC
  Filled 2020-12-28: qty 1

## 2020-12-28 MED ORDER — HEPARIN SODIUM (PORCINE) 1000 UNIT/ML IJ SOLN
INTRAMUSCULAR | Status: DC | PRN
  Administered 2020-12-28 (×2): 2000 [IU] via INTRAVENOUS
  Administered 2020-12-28: 4000 [IU] via INTRAVENOUS

## 2020-12-28 MED ORDER — HEPARIN SODIUM (PORCINE) 5000 UNIT/ML IJ SOLN
5000.0000 [IU] | Freq: Three times a day (TID) | INTRAMUSCULAR | Status: DC
  Administered 2020-12-28 – 2020-12-30 (×5): 5000 [IU] via SUBCUTANEOUS
  Filled 2020-12-28 (×6): qty 1

## 2020-12-28 MED ORDER — LABETALOL HCL 5 MG/ML IV SOLN
10.0000 mg | INTRAVENOUS | Status: AC | PRN
  Administered 2020-12-29 (×4): 10 mg via INTRAVENOUS
  Filled 2020-12-28 (×2): qty 4

## 2020-12-28 MED ORDER — PANTOPRAZOLE SODIUM 40 MG PO TBEC
40.0000 mg | DELAYED_RELEASE_TABLET | Freq: Two times a day (BID) | ORAL | Status: DC
  Administered 2020-12-28 – 2020-12-30 (×4): 40 mg via ORAL
  Filled 2020-12-28 (×4): qty 1

## 2020-12-28 MED ORDER — ALPRAZOLAM 0.5 MG PO TABS
0.5000 mg | ORAL_TABLET | Freq: Once | ORAL | Status: AC
  Administered 2020-12-28: 0.5 mg via ORAL
  Filled 2020-12-28: qty 1

## 2020-12-28 MED ORDER — HEPARIN (PORCINE) IN NACL 1000-0.9 UT/500ML-% IV SOLN
INTRAVENOUS | Status: AC
  Filled 2020-12-28: qty 1000

## 2020-12-28 MED ORDER — VENLAFAXINE HCL ER 75 MG PO CP24
150.0000 mg | ORAL_CAPSULE | Freq: Every day | ORAL | Status: DC
  Administered 2020-12-29 – 2020-12-30 (×2): 150 mg via ORAL
  Filled 2020-12-28 (×2): qty 2

## 2020-12-28 MED ORDER — MOMETASONE FURO-FORMOTEROL FUM 100-5 MCG/ACT IN AERO
2.0000 | INHALATION_SPRAY | Freq: Two times a day (BID) | RESPIRATORY_TRACT | Status: DC
  Administered 2020-12-29 – 2020-12-30 (×3): 2 via RESPIRATORY_TRACT
  Filled 2020-12-28: qty 8.8

## 2020-12-28 MED ORDER — IODIXANOL 320 MG/ML IV SOLN
INTRAVENOUS | Status: DC | PRN
  Administered 2020-12-28: 70 mL via INTRA_ARTERIAL

## 2020-12-28 MED ORDER — FENTANYL CITRATE (PF) 100 MCG/2ML IJ SOLN
INTRAMUSCULAR | Status: DC | PRN
  Administered 2020-12-28 (×2): 50 ug via INTRAVENOUS
  Administered 2020-12-28: 25 ug via INTRAVENOUS
  Administered 2020-12-28: 50 ug via INTRAVENOUS

## 2020-12-28 SURGICAL SUPPLY — 32 items
BALLN MUSTANG 12X20X75 (BALLOONS) ×3
BALLN MUSTANG 4.0X40 75 (BALLOONS) ×3
BALLOON MUSTANG 12X20X75 (BALLOONS) IMPLANT
BALLOON MUSTANG 4.0X40 75 (BALLOONS) IMPLANT
CATH OMNI FLUSH 5F 65CM (CATHETERS) ×1 IMPLANT
CLOSURE PERCLOSE PROSTYLE (VASCULAR PRODUCTS) ×2 IMPLANT
FILTER CO2 0.2 MICRON (VASCULAR PRODUCTS) ×1 IMPLANT
GUIDE CATH VISTA IMA 6F (CATHETERS) ×1 IMPLANT
KIT ENCORE 26 ADVANTAGE (KITS) ×6 IMPLANT
KIT MICROPUNCTURE NIT STIFF (SHEATH) ×2 IMPLANT
KIT PV (KITS) ×3 IMPLANT
PINNACLE LONG 6F 25CM (SHEATH) ×3
RESERVOIR CO2 (VASCULAR PRODUCTS) ×1 IMPLANT
SET FLUSH CO2 (MISCELLANEOUS) ×1 IMPLANT
SHEATH BRITE TIP 7FR 35CM (SHEATH) ×6 IMPLANT
SHEATH INTRO PINNACLE 6F 25CM (SHEATH) IMPLANT
SHEATH PINNACLE 5F 10CM (SHEATH) ×1 IMPLANT
SHEATH PINNACLE 6F 10CM (SHEATH) ×1 IMPLANT
SHEATH PROBE COVER 6X72 (BAG) ×2 IMPLANT
STENT EXPRESS LD 7X17X75 (Permanent Stent) ×1 IMPLANT
STENT HERCULINK RX 6.0X12X135 (Permanent Stent) ×1 IMPLANT
STENT HERCULINK RX 6.0X15X135 (Permanent Stent) ×1 IMPLANT
STENT OMNILINK ELITE 7X19X80 (Permanent Stent) ×1 IMPLANT
STENT VIABAHN 8X29X80 VBX (Permanent Stent) ×1 IMPLANT
STENT VIABAHN VBX 7X59X80 (Permanent Stent) ×2 IMPLANT
STOPCOCK MORSE 400PSI 3WAY (MISCELLANEOUS) ×1 IMPLANT
TRANSDUCER W/STOPCOCK (MISCELLANEOUS) ×3 IMPLANT
TRAY PV CATH (CUSTOM PROCEDURE TRAY) ×3 IMPLANT
TUBING CIL FLEX 10 FLL-RA (TUBING) ×1 IMPLANT
WIRE BENTSON .035X145CM (WIRE) ×1 IMPLANT
WIRE ROSEN-J .035X180CM (WIRE) ×2 IMPLANT
WIRE STABILIZER XS .014X180CM (WIRE) ×1 IMPLANT

## 2020-12-28 NOTE — Progress Notes (Signed)
Patient stated she takes 1mg  xanax at home as needed for anxiety. Messaged MD requesting 1mg  instead of 0.5mg  per patient request. Patient was given 0.5mg . MD ordered 1mg  as requested. Pharmacy approved 0.5mg  "once". Patient was given additional 0.5mg  dose.   Daymon Larsen, RN

## 2020-12-28 NOTE — Op Note (Signed)
DATE OF SERVICE: 12/28/2020  PATIENT:  [name]  60 y.o. female  PRE-OPERATIVE DIAGNOSIS:  Chronic mesenteric ischemia  POST-OPERATIVE DIAGNOSIS:  Chronic mesenteric ischemia, severe left common iliac plaque (>95%)  PROCEDURE:   1) US guided bilateral common femoral artery access 2) Aortogram  3) SMA selective angiography 4) Celiac selective angiography (total contrast 58mL) 5) SMA stenting (6x71mm HercuLink) 6) Terminal aortic stenting (8x41mm VBX post dilated to 36mm) 7) Bilateral common iliac artery stenting (7x49mm VBX x 2) 8) Conscious sedation (156 minutes)  SURGEON:  Surgeon(s) and Role:    * Cherre Robins, MD - Primary  ASSISTANT: none  ANESTHESIA:   local and IV sedation  EBL: min  BLOOD ADMINISTERED:none  DRAINS: none   LOCAL MEDICATIONS USED:  LIDOCAINE   SPECIMEN:  none  COUNTS: confirmed correct.  TOURNIQUET:  None  PATIENT DISPOSITION:  PACU - hemodynamically stable.   Delay start of Pharmacological VTE agent (>24hrs) due to surgical blood loss or risk of bleeding: no  INDICATION FOR PROCEDURE: [name] is a 60 y.o. female with severe, diffuse atherosclerosis and chronic mesenteric ischemia.  She has chronic renal insufficiency with a GFR of 22.  I counseled her extensively that mesenteric intervention would require some contrast.  This might expose her to contrast nephropathy and possibly a deterioration of her renal function.  She may need dialysis.  She understood this risk and wished to proceed because of her discomfort.  We also discussed access site complications.  The patient understood and wished to proceed.  OPERATIVE FINDINGS:  Aortogram reveals prior terminal aortic stenting prior renal stenting CO2 aortography performed for majority of diagnostic studies 70% SMA stenosis. 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 95% right common iliac stenosis Unremarkable SMA stenting. Unable to track a stent into the celiac artery.  I elected  to not continue because of the risk of contrast nephropathy. Right common iliac stenosis stented.  Post stenting we noted perforation with extravasation in the right common iliac artery as well as ulceration in the aortic stent. I elected to cover all of these lesions with terminal aortic stenting and bilateral common iliac artery stenting. Angiogram shows excellent flow through the terminal aorta and through the aortic bifurcation.  DESCRIPTION OF PROCEDURE: After identification of the patient in the pre-operative holding area, the patient was transferred to the operating room. The patient was positioned supine on the operating room table. Anesthesia was induced. The bilateral groins was prepped and draped in standard fashion. A surgical pause was performed confirming correct patient, procedure, and operative location.  The bilateral groins were anesthetized with subcutaneous injection of 1% lidocaine. Using ultrasound guidance, the bilateral common femoral artery was accessed with micropuncture technique. Fluoroscopy was used to confirm cannulation over the femoral head. Sheathogram was not performed. The 84F sheath was upsized to 22F.   From the right common femoral artery access a Bentson wire was advanced into the abdominal aorta.  There was some difficulty tracking the wire through the right common iliac artery.  Through a series of wire and sheath exchanges we were ultimately able to establish platform across a severe right common iliac artery stenosis.  The patient was systemically heparinized.  A Rosen wire was advanced into the descending thoracic aorta.  Over the wire an Omni Flush catheter was advanced.  Aortograms were performed with carbon dioxide gas in anterior-posterior and lateral projections.  We identified the superior mesenteric artery and celiac artery.  The superior mesenteric artery did show a 70% stenosis.  Using an IMA catheter and a 1 4 stabilizer wire I was able to cross the lesion  and advanced a wire into the distal superior mesenteric artery.  Over the wire a 6x46mm Herculink balloon expandable stent was advanced across the lesion.  Confirmatory angiography was performed to ensure that we were treating the stenosis.  The balloon was deployed in standard fashion.  Excellent technical result was noted.  No residual was noted.  I then used the same system to select the celiac artery.  I was not able to track a 6 x 15 mm Omnilink stent across a 50% stenosis of the celiac origin because of the steep angulation.  I elected to abandon this to avoid further contrast administration.  The right iliac artery was stented with a 7 x 18 mm Omnilink stent.  Unfortunately on follow-up angiography the stent appeared to impinge upon the left iliac artery.  From the left common femoral access I was able to navigate a Rosen wire past the stent and deployed a 7 x 18 mm Omnilink stent in the left common iliac using "kissing" iliac technique, keeping the right stent opposed with simultaneous inflation of the 7 mm deployment balloon.  Unfortunately on follow-up angiogram I noted a perforation of the right common iliac artery with extravasation of contrast.  I also noted an aortic ulceration without perforation.  I felt that the 2 findings could not be ignored.  I brought an 8 x 29 mm VBX stent and through the left common femoral access, deployed it in the terminal aorta about the area of previous stenting.  The stent was postdilated to 12 mm.  I then introduced a 7 x 59 VBX stents through the bilateral common femoral access.  These were deployed using kissing iliac technique with the proximal edge within the aortic stent and distal edge well beyond the previous iliac stenting.  Completion angiogram revealed successful seal of the perforations and significantly improved flow through the terminal aorta and common iliac arteries.  A Perclose device was used to close the arteriotomy. Hemostasis was excellent upon  completion.  Conscious sedation was administered with the use of IV fentanyl and midazolam under continuous physician and nurse monitoring.  Heart rate, blood pressure, and oxygen saturation were continuously monitored.  Total sedation time was 156 minutes  Upon completion of the case instrument and sharps counts were confirmed correct. The patient was transferred to the PACU in good condition. I was present for all portions of the procedure.  PLAN: Successful SMA revascularization.  Unfortunately required aorto and bilateral common iliac artery stenting.  Admit 4E. Needs dual antiplatelet therapy and high intensity statin going forward.  Continue IV hydration.  Check basic metabolic panel in the morning.  Give diet and see how she tolerates food.  Yevonne Aline. Stanford Breed, MD [facility] Phone Number: [phone] 12/28/2020 11:44 AM

## 2020-12-28 NOTE — Progress Notes (Signed)
Pt received from cath lab. VSS. Bilateral groin level 0. DP pulses palpable. Pt oriented to room and unit. Call light in reach.  Clyde Canterbury, RN

## 2020-12-28 NOTE — Progress Notes (Signed)
Patients daughter is at the bedside, [address]ell Loop, [phone]. Patient stated daughter is intoxicated.  Daymon Larsen, RN

## 2020-12-28 NOTE — Interval H&P Note (Signed)
History and Physical Interval Note:  12/28/2020 8:38 AM  [name] General  has presented today for surgery, with the diagnosis of chronic mesinteric ischemia.  The various methods of treatment have been discussed with the patient and family. After consideration of risks, benefits and other options for treatment, the patient has consented to  Procedure(s): ABDOMINAL AORTOGRAM W/LOWER EXTREMITY (N/A) as a surgical intervention.  [name] history has been reviewed, patient examined, no change in status, stable for surgery.  I have reviewed the patient's chart and labs.  Questions were answered to the patient's satisfaction.     Cherre Robins

## 2020-12-29 ENCOUNTER — Observation Stay (HOSPITAL_COMMUNITY): Payer: Medicare Other

## 2020-12-29 LAB — BASIC METABOLIC PANEL
Anion gap: 7 (ref 5–15)
BUN: 24 mg/dL — ABNORMAL HIGH (ref 6–20)
CO2: 23 mmol/L (ref 22–32)
Calcium: 9.1 mg/dL (ref 8.9–10.3)
Chloride: 105 mmol/L (ref 98–111)
Creatinine, Ser: 2.84 mg/dL — ABNORMAL HIGH (ref 0.44–1.00)
GFR, Estimated: 19 mL/min — ABNORMAL LOW (ref >60)
Glucose, Bld: 134 mg/dL — ABNORMAL HIGH (ref 70–99)
Potassium: 5.5 mmol/L — ABNORMAL HIGH (ref 3.5–5.1)
Sodium: 135 mmol/L (ref 135–145)

## 2020-12-29 MED ORDER — SCOPOLAMINE 1 MG/3DAYS TD PT72
1.0000 | MEDICATED_PATCH | TRANSDERMAL | Status: DC
  Administered 2020-12-29: 1.5 mg via TRANSDERMAL
  Filled 2020-12-29: qty 1

## 2020-12-29 MED ORDER — SODIUM CHLORIDE 0.9 % IV SOLN: INTRAVENOUS | Status: DC

## 2020-12-29 MED ORDER — ALUM & MAG HYDROXIDE-SIMETH 200-200-20 MG/5ML PO SUSP
30.0000 mL | Freq: Once | ORAL | Status: DC
  Filled 2020-12-29: qty 30

## 2020-12-29 MED ORDER — CLONIDINE HCL 0.2 MG PO TABS
0.2000 mg | ORAL_TABLET | Freq: Three times a day (TID) | ORAL | Status: DC
  Administered 2020-12-29 – 2020-12-30 (×3): 0.2 mg via ORAL
  Filled 2020-12-29 (×3): qty 1

## 2020-12-29 MED ORDER — TRAMADOL HCL 50 MG PO TABS
50.0000 mg | ORAL_TABLET | Freq: Four times a day (QID) | ORAL | Status: DC | PRN
  Administered 2020-12-29: 50 mg via ORAL
  Filled 2020-12-29 (×2): qty 1

## 2020-12-29 MED FILL — Midazolam HCl Inj 5 MG/5ML (Base Equivalent): INTRAMUSCULAR | Qty: 2.5 | Status: AC

## 2020-12-29 MED FILL — Hydralazine HCl Inj 20 MG/ML: INTRAMUSCULAR | Qty: 1 | Status: AC

## 2020-12-29 NOTE — Progress Notes (Addendum)
Progress Note    12/29/2020 8:57 AM 1 Day Post-Op  Subjective:  Nausea and vomiting since 3 am. Says she just doesn't feel well   Vitals:   12/28/20 2326 12/29/20 0322  BP: 133/72 (!) 141/77  Pulse: 88 63  Resp: 20 20  Temp: 98.2 F (36.8 C) 98.2 F (36.8 C)  SpO2: 96% 96%   Physical Exam: Cardiac: regular Lungs: non labored Extremities: bilateral femoral access sites clean, dry and intact. No swelling or hematoma. 2+ femoral pulses. 2+ left Dp. 1 + right Dp . Well perfused and warm  Abdomen:  Flat, soft, non tender, non distended Neurologic: alert and oriented  CBC    Component Value Date/Time   WBC 9.3 12/28/2020 1434   RBC 4.07 12/28/2020 1434   HGB 12.9 12/28/2020 1434   HGB 10.8 (L) 03/31/2020 1336   HCT 39.1 12/28/2020 1434   HCT 35.1 03/31/2020 1336   PLT 384 12/28/2020 1434   PLT 405 03/31/2020 1336   MCV 96.1 12/28/2020 1434   MCV 89 03/31/2020 1336   MCV 89 06/25/2014 1034   MCH 31.7 12/28/2020 1434   MCHC 33.0 12/28/2020 1434   RDW 16.5 (H) 12/28/2020 1434   RDW 18.0 (H) 03/31/2020 1336   RDW 16.8 (H) 06/25/2014 1034   LYMPHSABS 1.3 08/23/2020 1048   MONOABS 0.4 08/23/2020 1048   EOSABS 0.2 08/23/2020 1048   BASOSABS 0.1 08/23/2020 1048    BMET    Component Value Date/Time   NA 135 12/29/2020 0138   NA 141 06/24/2020 1646   NA 134 (L) 06/25/2014 1034   K 5.5 (H) 12/29/2020 0138   K 4.8 06/25/2014 1034   CL 105 12/29/2020 0138   CL 102 06/25/2014 1034   CO2 23 12/29/2020 0138   CO2 28 06/25/2014 1034   GLUCOSE 134 (H) 12/29/2020 0138   GLUCOSE 90 06/25/2014 1034   BUN 24 (H) 12/29/2020 0138   BUN 27 (H) 06/24/2020 1646   BUN 14 06/25/2014 1034   CREATININE 2.84 (H) 12/29/2020 0138   CREATININE 1.23 06/25/2014 1034   CALCIUM 9.1 12/29/2020 0138   CALCIUM 9.1 06/25/2014 1034   GFRNONAA 19 (L) 12/29/2020 0138   GFRNONAA 49 (L) 06/25/2014 1034   GFRNONAA >60 06/07/2012 1350   GFRAA 25 (L) 06/24/2020 1646   GFRAA 59 (L) 06/25/2014  1034   GFRAA >60 06/07/2012 1350    INR    Component Value Date/Time   INR 1.0 08/24/2020 0407     Intake/Output Summary (Last 24 hours) at 12/29/2020 0857 Last data filed at 12/29/2020 0000 Gross per 24 hour  Intake 1680 ml  Output --  Net 1680 ml     Assessment/Plan:  60 y.o. female is s/p  1) US guided bilateral common femoral artery access 2) Aortogram  3) SMA selective angiography 4) Celiac selective angiography (total contrast 37mL) 5) SMA stenting (6x11mm HercuLink) 6) Terminal aortic stenting (8x38mm VBX post dilated to 109mm) 7) Bilateral common iliac artery stenting (7x32mm VBX x 2) 1 Day Post-Op   Lower extremities well perfused and warm. Access sites clean, dry and intact without hematoma. Tolerating diet.  Pain controlled. She has some nausea and vomiting overnight. Zofran and phenergan given this morning. LBM 3/29. Takes linzess at home. States stool softeners dont work.  VSS. Hemodynamically stable. Scr elevated 2.84 this morning. Increase IVF this morning. Mobilize and she should be stable for discharge later today. She will need to continue on High dose statin, Aspirin and  Plavix at d/c. She will follow up in 1 month with Dr. Stanford Breed   DVT prophylaxis: sq heparin    Karoline Caldwell, PA-C [facility] [phone] 12/29/2020 8:57 AM   VASCULAR STAFF ADDENDUM: I have independently interviewed and examined the patient. I agree with the above.  Not sure why she is having N/V, perhaps small bowel adjusting to increased flow.  Fortunately, she had no postprandial pain when she ate. Hopefully she will be able to discharge this afternoon.  Yevonne Aline. Stanford Breed, MD [facility] Phone Number: [phone] 12/29/2020 10:51 AM

## 2020-12-29 NOTE — Plan of Care (Signed)

## 2020-12-29 NOTE — Progress Notes (Signed)
Patient reports persistent nausea / vomiting.  No abdominal pain.  Not ready for DC yet. KUB shows ? Persistent nephrogram. No SBO.  Will check renal ultrasound. Continue supportive care for N/V.   [name]. Stanford Breed, MD [facility] Phone Number: [phone] 12/29/2020 3:55 PM

## 2020-12-29 NOTE — Discharge Summary (Addendum)
Discharge Summary  [name] [id number] 60 y.o. Aug 25, 1961  Admit date: 12/28/2020  Discharge date and time: No discharge date for patient encounter.   Admitting Physician: Cherre Robins, MD   Discharge Physician: [facility]  Admission Diagnoses: Chronic mesenteric ischemia [facility]) [K55.1]  Discharge Diagnoses: same  Admission Condition: good  Discharged Condition: good  Indication for Admission: postoperative care  Hospital Course: Admitted postoperatively because of extensive procedure and contrast dose with CKDIV. She had severe nausea and vomiting POD#1. A KUB showed persistent nephrogram - likely from CKD. Duplex US showed no obstructive pathology in the ureters. She felt significantly better POD#2, and tolerated solid food without postprandial pain.  Consults: None  Treatments: IV hydration, analgesia: percocet and mesenteric stenting  Discharge Exam:  Vitals:   12/29/20 0322 12/29/20 0836  BP: (!) 141/77 (!) 170/82  Pulse: 63 62  Resp: 20 18  Temp: 98.2 F (36.8 C) 97.7 F (36.5 C)  SpO2: 96% 92%   Cardiac:  RRR Lungs:  unlabored Incisions:  C/D/I Extremities:  2+ DPs Abdomen:  soft  Disposition:   Patient Instructions:  Allergies as of 12/30/2020      Reactions   Doxycycline Anaphylaxis, Hives   Hydralazine Rash   Hydrocodone-acetaminophen Nausea And Vomiting   Iohexol Itching   Pt has itching nose after iv contrast injection      Medication List    STOP taking these medications   polyethylene glycol 17 g packet Commonly known as: MiraLax     TAKE these medications   acetaminophen 500 MG tablet Commonly known as: TYLENOL Take 1,000 mg by mouth every 8 (eight) hours as needed for mild pain or headache.   albuterol 108 (90 Base) MCG/ACT inhaler Commonly known as: VENTOLIN HFA Inhale 2 puffs into the lungs every 6 (six) hours as needed for wheezing or shortness of breath (asthma).   ALPRAZolam 1 MG tablet Commonly  known as: XANAX Take 0.5 tablets by mouth 2 (two) times daily as needed for anxiety.   amLODipine 10 MG tablet Commonly known as: NORVASC TAKE 1 TABLET BY MOUTH ONCE DAILY   aspirin 81 MG EC tablet Take 1 tablet (81 mg total) by mouth daily. Swallow whole.   atenolol 50 MG tablet Commonly known as: TENORMIN Take 1 tablet (50 mg total) by mouth daily.   atorvastatin 80 MG tablet Commonly known as: LIPITOR Take 80 mg by mouth daily.   azelastine 0.1 % nasal spray Commonly known as: ASTELIN Place 1 spray into both nostrils daily as needed for rhinitis or allergies.   clopidogrel 75 MG tablet Commonly known as: Plavix Take 1 tablet (75 mg total) by mouth daily.   ezetimibe 10 MG tablet Commonly known as: ZETIA Take 1 tablet (10 mg total) by mouth daily.   isosorbide mononitrate 60 MG 24 hr tablet Commonly known as: IMDUR TAKE 1 TABLET BY MOUTH DAILY   linaclotide 72 MCG capsule Commonly known as: Linzess Take 1 capsule (72 mcg total) by mouth daily before breakfast.   megestrol 20 MG tablet Commonly known as: MEGACE Take 20 mg by mouth daily.   mometasone-formoterol 100-5 MCG/ACT Aero Commonly known as: DULERA Take 2 puffs first thing in am and then another 2 puffs about 12 hours later. What changed:   how much to take  how to take this  when to take this  additional instructions   nitroGLYCERIN 0.4 MG SL tablet Commonly known as: NITROSTAT Place 1 tablet (0.4 mg total) under the  tongue every 5 (five) minutes as needed for chest pain.   oxyCODONE-acetaminophen 5-325 MG tablet Commonly known as: PERCOCET/ROXICET Take 1 tablet by mouth every 6 (six) hours as needed for moderate pain.   pantoprazole 40 MG tablet Commonly known as: PROTONIX TAKE 1 TABLET BY MOUTH TWICE (2) DAILY What changed: See the new instructions.   promethazine 25 MG tablet Commonly known as: PHENERGAN Take 25 mg by mouth daily as needed for nausea or vomiting.   venlafaxine XR 150  MG 24 hr capsule Commonly known as: Effexor XR Take 1 capsule (150 mg total) by mouth daily with breakfast. What changed: how much to take      Activity: activity as tolerated Diet: renal diet Wound Care: none needed  Follow-up with me in 4 weeks.  Signed: Yevonne Aline. Stanford Breed, MD [facility] Phone Number: [phone] 12/30/2020 12:43 PM

## 2020-12-29 NOTE — Discharge Instructions (Signed)
  [facility] [place]  Discharge Instructions  Lower Extremity Angiogram; Angioplasty/Stenting  Please refer to the following instructions for your post-procedure care. Your surgeon or physician assistant will discuss any changes with you.  Activity  Avoid lifting more than 8 pounds (1 gallons of milk) for 5 days after your procedure. You may walk as much as you can tolerate. It's OK to drive after 72 hours.  Bathing/Showering  You may shower the day after your procedure. If you have a bandage, you may remove it at 24- 48 hours. Clean your incision site with mild soap and water. Pat the area dry with a clean towel.  Diet  Resume your pre-procedure diet. There are no special food restrictions following this procedure. All patients with peripheral vascular disease should follow a low fat/low cholesterol diet. In order to heal from your surgery, it is CRITICAL to get adequate nutrition. Your body requires vitamins, minerals, and protein. Vegetables are the best source of vitamins and minerals. Vegetables also provide the perfect balance of protein. Processed food has little nutritional value, so try to avoid this.  Medications  Resume taking all of your medications unless your doctor tells you not to. If your incision is causing pain, you may take over-the-counter pain relievers such as acetaminophen (Tylenol)  Follow Up  Follow up will be arranged at the time of your procedure. You may have an office visit scheduled or may be scheduled for surgery. Ask your surgeon if you have any questions.  Please call us immediately for any of the following conditions: .Severe or worsening pain your legs or feet at rest or with walking. .Increased pain, redness, drainage at your groin puncture site. .Fever of 101 degrees or higher. .If you have any mild or slow bleeding from your puncture site: lie down, apply firm constant pressure over the area with a piece of gauze or a  clean wash cloth for 30 minutes- no peeking!, call 911 right away if you are still bleeding after 30 minutes, or if the bleeding is heavy and unmanageable.  Reduce your risk factors of vascular disease:  . Stop smoking. If you would like help call [facility] at 1-800-QUIT-NOW (1-800-784-8669) or [place] at 336-586-4000. . Manage your cholesterol . Maintain a desired weight . Control your diabetes . Keep your blood pressure down .  If you have any questions, please call the office at [phone] 

## 2020-12-30 ENCOUNTER — Observation Stay (HOSPITAL_COMMUNITY): Payer: Medicare Other

## 2020-12-30 ENCOUNTER — Ambulatory Visit: Payer: Medicare Other | Admitting: Gastroenterology

## 2020-12-30 DIAGNOSIS — N185 Chronic kidney disease, stage 5: Secondary | ICD-10-CM | POA: Diagnosis not present

## 2020-12-30 DIAGNOSIS — J449 Chronic obstructive pulmonary disease, unspecified: Secondary | ICD-10-CM | POA: Diagnosis present

## 2020-12-30 DIAGNOSIS — Z800 Family history of malignant neoplasm of digestive organs: Secondary | ICD-10-CM | POA: Diagnosis not present

## 2020-12-30 DIAGNOSIS — K551 Chronic vascular disorders of intestine: Secondary | ICD-10-CM | POA: Diagnosis present

## 2020-12-30 DIAGNOSIS — I771 Stricture of artery: Secondary | ICD-10-CM | POA: Diagnosis present

## 2020-12-30 DIAGNOSIS — Z20822 Contact with and (suspected) exposure to covid-19: Secondary | ICD-10-CM | POA: Diagnosis not present

## 2020-12-30 DIAGNOSIS — I739 Peripheral vascular disease, unspecified: Secondary | ICD-10-CM | POA: Diagnosis present

## 2020-12-30 DIAGNOSIS — K219 Gastro-esophageal reflux disease without esophagitis: Secondary | ICD-10-CM | POA: Diagnosis not present

## 2020-12-30 DIAGNOSIS — Z7902 Long term (current) use of antithrombotics/antiplatelets: Secondary | ICD-10-CM | POA: Diagnosis not present

## 2020-12-30 DIAGNOSIS — I350 Nonrheumatic aortic (valve) stenosis: Secondary | ICD-10-CM | POA: Diagnosis present

## 2020-12-30 DIAGNOSIS — Z90721 Acquired absence of ovaries, unilateral: Secondary | ICD-10-CM | POA: Diagnosis not present

## 2020-12-30 DIAGNOSIS — F17210 Nicotine dependence, cigarettes, uncomplicated: Secondary | ICD-10-CM | POA: Diagnosis present

## 2020-12-30 DIAGNOSIS — Z955 Presence of coronary angioplasty implant and graft: Secondary | ICD-10-CM | POA: Diagnosis not present

## 2020-12-30 DIAGNOSIS — I2510 Atherosclerotic heart disease of native coronary artery without angina pectoris: Secondary | ICD-10-CM | POA: Diagnosis not present

## 2020-12-30 DIAGNOSIS — F319 Bipolar disorder, unspecified: Secondary | ICD-10-CM | POA: Diagnosis present

## 2020-12-30 DIAGNOSIS — Z8051 Family history of malignant neoplasm of kidney: Secondary | ICD-10-CM | POA: Diagnosis not present

## 2020-12-30 DIAGNOSIS — E785 Hyperlipidemia, unspecified: Secondary | ICD-10-CM | POA: Diagnosis not present

## 2020-12-30 DIAGNOSIS — I252 Old myocardial infarction: Secondary | ICD-10-CM | POA: Diagnosis not present

## 2020-12-30 DIAGNOSIS — Z90710 Acquired absence of both cervix and uterus: Secondary | ICD-10-CM | POA: Diagnosis not present

## 2020-12-30 DIAGNOSIS — Z8541 Personal history of malignant neoplasm of cervix uteri: Secondary | ICD-10-CM | POA: Diagnosis not present

## 2020-12-30 DIAGNOSIS — G2581 Restless legs syndrome: Secondary | ICD-10-CM | POA: Diagnosis present

## 2020-12-30 DIAGNOSIS — Z79899 Other long term (current) drug therapy: Secondary | ICD-10-CM | POA: Diagnosis not present

## 2020-12-30 DIAGNOSIS — I708 Atherosclerosis of other arteries: Secondary | ICD-10-CM | POA: Diagnosis not present

## 2020-12-30 DIAGNOSIS — I120 Hypertensive chronic kidney disease with stage 5 chronic kidney disease or end stage renal disease: Secondary | ICD-10-CM | POA: Diagnosis not present

## 2020-12-30 LAB — BASIC METABOLIC PANEL
Anion gap: 3 — ABNORMAL LOW (ref 5–15)
BUN: 30 mg/dL — ABNORMAL HIGH (ref 6–20)
CO2: 22 mmol/L (ref 22–32)
Calcium: 7.8 mg/dL — ABNORMAL LOW (ref 8.9–10.3)
Chloride: 111 mmol/L (ref 98–111)
Creatinine, Ser: 2.63 mg/dL — ABNORMAL HIGH (ref 0.44–1.00)
GFR, Estimated: 20 mL/min — ABNORMAL LOW (ref >60)
Glucose, Bld: 92 mg/dL (ref 70–99)
Potassium: 4.4 mmol/L (ref 3.5–5.1)
Sodium: 136 mmol/L (ref 135–145)

## 2020-12-30 MED ORDER — ASPIRIN 81 MG PO TBEC: 81.0000 mg | DELAYED_RELEASE_TABLET | Freq: Every day | ORAL | 11 refills | Status: DC

## 2020-12-30 MED ORDER — OXYCODONE-ACETAMINOPHEN 5-325 MG PO TABS
1.0000 | ORAL_TABLET | Freq: Once | ORAL | Status: AC
  Administered 2020-12-30: 1 via ORAL
  Filled 2020-12-30: qty 1

## 2020-12-30 MED ORDER — OXYCODONE-ACETAMINOPHEN 5-325 MG PO TABS: 1.0000 | ORAL_TABLET | Freq: Four times a day (QID) | ORAL | 0 refills | Status: DC | PRN

## 2020-12-30 MED ORDER — OXYCODONE-ACETAMINOPHEN 5-325 MG PO TABS
1.0000 | ORAL_TABLET | ORAL | Status: DC | PRN
Start: 2020-12-30 — End: 2020-12-30
  Administered 2020-12-30: 1 via ORAL
  Filled 2020-12-30: qty 1

## 2020-12-30 NOTE — Progress Notes (Signed)
Dr Stanford Breed called and made aware of the above and order One time dose of Perocet 1 p.o. now

## 2020-12-30 NOTE — Progress Notes (Addendum)
Patient was aware that Dr. Stanford Breed order Ultra 50 mg p.o. for pain last night one every 6 hours. Patient slept all night except to go to [facility] at 2 a.m. with [facility]. to urinate. Ask patient this a.m. if in pain when did Vital signs. Patient stated ,"Yes" gave patient Ultram and she wanted to know what it was and I told her and she got upset and stated," I ain't taking this. It does not do anything for me. It is like taking candy." " I am not allegeric  To Perocet." I do not know why it is on my chart." patient is requesting Perocet.

## 2020-12-30 NOTE — Progress Notes (Signed)
Paged Dr. Roselie Awkward at 21:00 and 21:48 to get something stronger for pain than Tylenol . Call return see orders .

## 2020-12-30 NOTE — Progress Notes (Addendum)
  Progress Note    12/30/2020 7:29 AM 2 Days Post-Op  Subjective:  Upset about pain medication.  Still with nausea with any oral intake   Vitals:   12/29/20 2348 12/30/20 0457  BP: 132/69 129/62  Pulse: 64 (!) 58  Resp: 16 16  Temp: 98.1 F (36.7 C) 98.2 F (36.8 C)  SpO2: 96% 92%   Physical Exam: Lungs:  Non labored Incisions:  Groins soft Extremities:  Palpable R DP and L ATA Abdomen:  Soft, only tender midline with deep palpation Neurologic: A&O  CBC    Component Value Date/Time   WBC 9.3 12/28/2020 1434   RBC 4.07 12/28/2020 1434   HGB 12.9 12/28/2020 1434   HGB 10.8 (L) 03/31/2020 1336   HCT 39.1 12/28/2020 1434   HCT 35.1 03/31/2020 1336   PLT 384 12/28/2020 1434   PLT 405 03/31/2020 1336   MCV 96.1 12/28/2020 1434   MCV 89 03/31/2020 1336   MCV 89 06/25/2014 1034   MCH 31.7 12/28/2020 1434   MCHC 33.0 12/28/2020 1434   RDW 16.5 (H) 12/28/2020 1434   RDW 18.0 (H) 03/31/2020 1336   RDW 16.8 (H) 06/25/2014 1034   LYMPHSABS 1.3 08/23/2020 1048   MONOABS 0.4 08/23/2020 1048   EOSABS 0.2 08/23/2020 1048   BASOSABS 0.1 08/23/2020 1048    BMET    Component Value Date/Time   NA 135 12/29/2020 0138   NA 141 06/24/2020 1646   NA 134 (L) 06/25/2014 1034   K 5.5 (H) 12/29/2020 0138   K 4.8 06/25/2014 1034   CL 105 12/29/2020 0138   CL 102 06/25/2014 1034   CO2 23 12/29/2020 0138   CO2 28 06/25/2014 1034   GLUCOSE 134 (H) 12/29/2020 0138   GLUCOSE 90 06/25/2014 1034   BUN 24 (H) 12/29/2020 0138   BUN 27 (H) 06/24/2020 1646   BUN 14 06/25/2014 1034   CREATININE 2.84 (H) 12/29/2020 0138   CREATININE 1.23 06/25/2014 1034   CALCIUM 9.1 12/29/2020 0138   CALCIUM 9.1 06/25/2014 1034   GFRNONAA 19 (L) 12/29/2020 0138   GFRNONAA 49 (L) 06/25/2014 1034   GFRNONAA >60 06/07/2012 1350   GFRAA 25 (L) 06/24/2020 1646   GFRAA 59 (L) 06/25/2014 1034   GFRAA >60 06/07/2012 1350    INR    Component Value Date/Time   INR 1.0 08/24/2020 0407      Intake/Output Summary (Last 24 hours) at 12/30/2020 0729 Last data filed at 12/30/2020 0610 Gross per 24 hour  Intake 1440 ml  Output 250 ml  Net 1190 ml     Assessment/Plan:  60 y.o. female is s/p SMA and bilateral AOI stenting 2 Days Post-Op   BLE well perfused with palpable pulses Still having nausea with oral intake; abd soft, continue to monitor Home today if nausea improves with breakfast   Dagoberto Ligas, PA-C [facility] [phone] 12/30/2020 7:29 AM  VASCULAR STAFF ADDENDUM: I have independently interviewed and examined the patient. I agree with the above.  Looks good today. N/V resolved. Tolerating solid food and has no postprandial pain (!) Renal duplex shows no obstructive pathology Continue ASA / Plavix going forward Follow up with me in 4 weeks with mesenteric duplex.  Yevonne Aline. Stanford Breed, MD [facility] Phone Number: [phone] 12/30/2020 12:37 PM

## 2021-01-06 ENCOUNTER — Telehealth: Payer: Self-pay

## 2021-01-06 NOTE — Telephone Encounter (Signed)
Patient called to report that her left leg "is not right". It still really hurts. "You know how the stent is supposed to lay down - this is sticking up, it's like I broke a bone and it's not right. I've had 6 stents and this one is not right. It's different." She denies that her leg is cold, but it hurts including her foot - says this is the same pain she had prior to stent placement. Asked patient to clarify "sticking up" says the "stent is not out of the skin, but it's right underneath". Says there is a small amount of redness in her groin. Added on to [facility] schedule to evaluate.

## 2021-01-09 ENCOUNTER — Other Ambulatory Visit: Payer: Self-pay

## 2021-01-09 ENCOUNTER — Other Ambulatory Visit: Payer: Self-pay | Admitting: *Deleted

## 2021-01-09 ENCOUNTER — Ambulatory Visit (HOSPITAL_COMMUNITY)
Admission: RE | Admit: 2021-01-09 | Discharge: 2021-01-09 | Disposition: A | Discharge status: Home / Self Care | Payer: Medicare Other | Source: Ambulatory Visit | Attending: Vascular Surgery | Admitting: Vascular Surgery

## 2021-01-09 DIAGNOSIS — Q251 Coarctation of aorta: Secondary | ICD-10-CM

## 2021-01-09 NOTE — Progress Notes (Signed)
ERROR

## 2021-01-09 NOTE — Progress Notes (Signed)
[facility] [place]  ASSESSMENT / PLAN: 60 y.o. female status post SMA stenting for chronic mesenteric ischemia and re-do aorto-bi-iliac stenting for occlusive disease with history of terminal aortic stenting 12/28/20.  No evidence of pseudoaneurysm.  Palpable pedal pulses.  No real improvement in her abdominal symptoms.  No availability for mesenteric duplex today.  We will get this is soon as possible to ensure stent is patent.  Recommend the following which can slow the progression of atherosclerosis and reduce the risk of major adverse cardiac / limb events:  Complete cessation from all tobacco products. Blood glucose control with goal A1c < 7%. Blood pressure control with goal blood pressure < 140/90 mmHg. Lipid reduction therapy with goal LDL-C <100 mg/dL (<70 if symptomatic from PAD).  Aspirin 81mg  PO QD.  Clopidogrel 75mg  PO QD. Atorvastatin 40-80mg  PO QD (or other "high intensity" statin therapy).  Follow-up with me in 3 months with ABI and enteric duplex.  CHIEF COMPLAINT: Abdominal pain  HISTORY OF PRESENT ILLNESS:  Previously: Kyeisha Janowicz is a 60 y.o. female referred to clinic for evaluation of possible chronic mesenteric ischemia.  She has had a thorough work-up by gastroenterologist for constant generalized abdominal pain.  The pain is exacerbated by eating.  She reports she has lost nearly 30 pounds over the past year.  She reports she is afraid to eat.  01/10/21: Patient returns to clinic concerned about pain and "bulging" from her left groin.  She reports she continues to have some abdominal pain but has been able to eat.  VASCULAR SURGICAL HISTORY:  Bilateral renal artery stenting by Dr. Einar Gip intervention 05/10/2020 Distal aortic stenting by Dr. Einar Gip with 10 x 29 Omnilink postdilated to 12 mm 10/29/2017 SMA stenting (6x36mm HercuLink); Terminal aortic stenting (8x47mm VBX post dilated to 67mm; Bilateral common iliac artery stenting  (7x37mm VBX x 2) by me 12/28/20.  VASCULAR RISK FACTORS: Patient reports: -No history of cerebrovascular disease / stroke / transient ischemic attack. - No history of coronary artery disease. Coronary cath June 2019 unremarkable. - No history of diabetes mellitus (No results found for: HGBA1C). - Lifelong history of smoking. Actively smoking. - + history of hypertension.  - + history of chronic kidney disease (GFR 28). - + history of chronic obstructive pulmonary disease - HF with preserved ejection fraction (EF 60% January 2021).   Past Medical History:  Diagnosis Date  . Anemia 2008  . Anxiety   . Aortic stenosis   . Arthritis    "all my joints ache" (09/07/2014)  . Asthma   . Bipolar 1 disorder ([place])   . Bradycardia   . Bruit   . Carpal tunnel syndrome   . Cervical cancer ([place]) 1985  . CHF (congestive heart failure) ([place])   . Chronic kidney disease (CKD), stage V ([place])   . COPD (chronic obstructive pulmonary disease) ([place]) 2000  . Coronary artery disease   . Daily headache   . Depression 2000  . Diverticulitis 2008  . GERD (gastroesophageal reflux disease)   . Glaucoma   . Heart murmur   . History of colon polyps 2009  . HLD (hyperlipidemia) 2013  . Hypertension 2013  . Hypovitaminosis D   . IBS (irritable bowel syndrome) 2008  . Myocardial infarction ([place])    2015  . PAD (peripheral artery disease) ([place])   . Pancreatitis 10/2011  . Pneumonia 07/2014  . RLS (restless legs syndrome)   . Schizophrenia ([place])   . Shortness of breath   .  Skin cancer   . Small bowel obstruction ([place]) 2008    Past Surgical History:  Procedure Laterality Date  . ABDOMINAL ANGIOGRAM N/A 09/07/2014   Procedure: ABDOMINAL ANGIOGRAM;  Surgeon: Laverda Page, MD;  Location: [facility] CATH LAB;  Service: Cardiovascular;  Laterality: N/A;  . ABDOMINAL AORTOGRAM W/LOWER EXTREMITY N/A 12/28/2020   Procedure: ABDOMINAL AORTOGRAM W/LOWER EXTREMITY;  Surgeon: Cherre Robins, MD;  Location: [facility] CV LAB;  Service: Cardiovascular;  Laterality: N/A;  . APPENDECTOMY  1996  . BIOPSY  08/06/2020   Procedure: BIOPSY;  Surgeon: Jackquline Denmark, MD;  Location: [facility] ENDOSCOPY;  Service: Endoscopy;;  . COLON SURGERY  2008   6 inches of colon removed due to obstruction  . COLONOSCOPY WITH PROPOFOL N/A 10/25/2016   Procedure: COLONOSCOPY WITH PROPOFOL;  Surgeon: Milus Banister, MD;  Location: [facility]OS[facility];  Service: Endoscopy;  Laterality: N/A;  . COLONOSCOPY WITH PROPOFOL N/A 08/07/2020   Procedure: COLONOSCOPY WITH PROPOFOL;  Surgeon: Jackquline Denmark, MD;  Location: [facility];  Service: Endoscopy;  Laterality: N/A;  . COLONOSCOPY WITH PROPOFOL N/A 08/27/2020   Procedure: COLONOSCOPY WITH PROPOFOL;  Surgeon: Lavena Bullion, DO;  Location: [facility]OS[facility];  Service: Gastroenterology;  Laterality: N/A;  . CORONARY ANGIOPLASTY WITH STENT PLACEMENT  09/07/2014   "2"  . ENTEROSCOPY N/A 08/27/2020   Procedure: ENTEROSCOPY;  Surgeon: Lavena Bullion, DO;  Location: [facility];  Service: Gastroenterology;  Laterality: N/A;  Push enteroscopy   . ESOPHAGOGASTRODUODENOSCOPY (EGD) WITH PROPOFOL N/A 08/06/2020   Procedure: ESOPHAGOGASTRODUODENOSCOPY (EGD) WITH PROPOFOL;  Surgeon: Jackquline Denmark, MD;  Location: [facility];  Service: Endoscopy;  Laterality: N/A;  . GIVENS CAPSULE STUDY N/A 08/24/2020   Procedure: GIVENS CAPSULE STUDY;  Surgeon: Lavena Bullion, DO;  Location: [facility];  Service: Gastroenterology;  Laterality: N/A;  . HOT HEMOSTASIS N/A 08/07/2020   Procedure: HOT HEMOSTASIS (ARGON PLASMA COAGULATION/BICAP);  Surgeon: Jackquline Denmark, MD;  Location: [facility];  Service: Endoscopy;  Laterality: N/A;  . HOT HEMOSTASIS N/A 08/27/2020   Procedure: HOT HEMOSTASIS (ARGON PLASMA COAGULATION/BICAP);  Surgeon: Lavena Bullion, DO;  Location: [facility];  Service: Gastroenterology;  Laterality: N/A;  . LEFT HEART CATH AND CORONARY ANGIOGRAPHY N/A 03/25/2018   Procedure: LEFT HEART  CATH AND CORONARY ANGIOGRAPHY;  Surgeon: Nigel Mormon, MD;  Location: [facility] CV LAB;  Service: Cardiovascular;  Laterality: N/A;  . LEFT HEART CATHETERIZATION WITH CORONARY ANGIOGRAM N/A 09/07/2014   Procedure: LEFT HEART CATHETERIZATION WITH CORONARY ANGIOGRAM;  Surgeon: Laverda Page, MD;  Location: [facility] CATH LAB;  Service: Cardiovascular;  Laterality: N/A;  . LOWER EXTREMITY ANGIOGRAPHY  10/29/2017   Procedure: Lower Extremity Angiography;  Surgeon: Adrian Prows, MD;  Location: [facility] CV LAB;  Service: Cardiovascular;;  . PERIPHERAL VASCULAR CATHETERIZATION N/A 11/01/2015   Procedure: Renal Angiography;  Surgeon: Adrian Prows, MD;  Location: [facility] CV LAB;  Service: Cardiovascular;  Laterality: N/A;  . PERIPHERAL VASCULAR CATHETERIZATION N/A 04/10/2016   Procedure: Renal Angiography;  Surgeon: Adrian Prows, MD;  Location: [facility] CV LAB;  Service: Cardiovascular;  Laterality: N/A;  . PERIPHERAL VASCULAR CATHETERIZATION  04/10/2016   Procedure: Peripheral Vascular Intervention;  Surgeon: Adrian Prows, MD;  Location: [facility] CV LAB;  Service: Cardiovascular;;  . PERIPHERAL VASCULAR INTERVENTION  10/29/2017   Procedure: PERIPHERAL VASCULAR INTERVENTION;  Surgeon: Adrian Prows, MD;  Location: [facility] LAB;  Service: Cardiovascular;;  . PERIPHERAL VASCULAR INTERVENTION Bilateral 12/28/2020   Procedure: PERIPHERAL VASCULAR INTERVENTION;  Surgeon: Cherre Robins,  MD;  Location: [facility] LAB;  Service: Cardiovascular;  Laterality: Bilateral;  . POLYPECTOMY  08/07/2020   Procedure: POLYPECTOMY;  Surgeon: Jackquline Denmark, MD;  Location: [facility];  Service: Endoscopy;;  . POLYPECTOMY  08/27/2020   Procedure: POLYPECTOMY;  Surgeon: Lavena Bullion, DO;  Location: [facility];  Service: Gastroenterology;;  . RENAL ANGIOGRAPHY N/A 10/29/2017   Procedure: RENAL ANGIOGRAPHY;  Surgeon: Adrian Prows, MD;  Location: [facility] CV LAB;  Service: Cardiovascular;  Laterality: N/A;  .  RENAL ANGIOGRAPHY N/A 05/10/2020   Procedure: RENAL ANGIOGRAPHY;  Surgeon: Nigel Mormon, MD;  Location: [facility] CV LAB;  Service: Cardiovascular;  Laterality: N/A;  . RIGHT OOPHORECTOMY Right 1996  . TOTAL ABDOMINAL HYSTERECTOMY  1997    Family History  Problem Relation Age of Onset  . Other Mother        many bowel obstructions  . Heart disease Mother   . Colon polyps Mother   . Kidney cancer Father   . Bone cancer Father   . Diabetes Father   . Heart disease Father   . Diabetes Daughter   . Colon cancer Paternal Grandfather   . Heart disease Brother   . Esophageal cancer Neg Hx     Social History   Socioeconomic History  . Marital status: Divorced    Spouse name: Not on file  . Number of children: 1  . Years of education: Not on file  . Highest education level: Not on file  Occupational History  . Occupation: Scientist, research (life sciences): UNEMPLOYED  Tobacco Use  . Smoking status: Current Some Day Smoker    Packs/day: 1.00    Years: 40.00    Pack years: 40.00    Types: Cigarettes  . Smokeless tobacco: Never Used  Vaping Use  . Vaping Use: Former  Substance and Sexual Activity  . Alcohol use: No  . Drug use: Yes    Frequency: 3.0 times per week    Types: Marijuana    Comment: last use yesterday  . Sexual activity: Yes    Birth control/protection: Post-menopausal  Other Topics Concern  . Not on file  Social History Narrative  . Not on file   Social Determinants of Health   Financial Resource Strain: Not on file  Food Insecurity: Not on file  Transportation Needs: Not on file  Physical Activity: Not on file  Stress: Not on file  Social Connections: Not on file  Intimate Partner Violence: Not on file    Allergies  Allergen Reactions  . Doxycycline Anaphylaxis and Hives  . Hydralazine Rash  . Hydrocodone-Acetaminophen Nausea And Vomiting  . Iohexol Itching    Pt has itching nose after iv contrast injection    Current Outpatient  Medications  Medication Sig Dispense Refill  . acetaminophen (TYLENOL) 500 MG tablet Take 1,000 mg by mouth every 8 (eight) hours as needed for mild pain or headache.    . albuterol (PROVENTIL HFA;VENTOLIN HFA) 108 (90 BASE) MCG/ACT inhaler Inhale 2 puffs into the lungs every 6 (six) hours as needed for wheezing or shortness of breath (asthma).    . ALPRAZolam (XANAX) 1 MG tablet Take 0.5 tablets by mouth 2 (two) times daily as needed for anxiety.    Marland Kitchen amLODipine (NORVASC) 10 MG tablet TAKE 1 TABLET BY MOUTH ONCE DAILY (Patient taking differently: Take 10 mg by mouth daily.) 30 tablet 3  . aspirin EC 81 MG EC tablet Take 1 tablet (81 mg total) by mouth  daily. Swallow whole. 30 tablet 11  . atenolol (TENORMIN) 50 MG tablet Take 1 tablet (50 mg total) by mouth daily. 90 tablet 1  . atorvastatin (LIPITOR) 80 MG tablet Take 80 mg by mouth daily.     Marland Kitchen azelastine (ASTELIN) 0.1 % nasal spray Place 1 spray into both nostrils daily as needed for rhinitis or allergies.     Marland Kitchen clopidogrel (PLAVIX) 75 MG tablet Take 1 tablet (75 mg total) by mouth daily.    Marland Kitchen ezetimibe (ZETIA) 10 MG tablet Take 1 tablet (10 mg total) by mouth daily. 90 tablet 3  . isosorbide mononitrate (IMDUR) 60 MG 24 hr tablet TAKE 1 TABLET BY MOUTH DAILY (Patient taking differently: Take 60 mg by mouth daily.) 90 tablet 3  . linaclotide (LINZESS) 72 MCG capsule Take 1 capsule (72 mcg total) by mouth daily before breakfast. 30 capsule 2  . megestrol (MEGACE) 20 MG tablet Take 20 mg by mouth daily.    . mometasone-formoterol (DULERA) 100-5 MCG/ACT AERO Take 2 puffs first thing in am and then another 2 puffs about 12 hours later. (Patient taking differently: Inhale 2 puffs into the lungs in the morning and at bedtime.) 1 Inhaler 11  . nitroGLYCERIN (NITROSTAT) 0.4 MG SL tablet Place 1 tablet (0.4 mg total) under the tongue every 5 (five) minutes as needed for chest pain. 30 tablet 1  . oxyCODONE-acetaminophen (PERCOCET/ROXICET) 5-325 MG tablet  Take 1 tablet by mouth every 6 (six) hours as needed for moderate pain. 10 tablet 0  . pantoprazole (PROTONIX) 40 MG tablet TAKE 1 TABLET BY MOUTH TWICE (2) DAILY (Patient taking differently: Take 40 mg by mouth 2 (two) times daily.) 180 tablet 2  . promethazine (PHENERGAN) 25 MG tablet Take 25 mg by mouth daily as needed for nausea or vomiting.    . venlafaxine XR (EFFEXOR XR) 150 MG 24 hr capsule Take 1 capsule (150 mg total) by mouth daily with breakfast. (Patient taking differently: Take 300 mg by mouth daily with breakfast.) 30 capsule 0   No current facility-administered medications for this visit.    REVIEW OF SYSTEMS:  [X]  denotes positive finding, [ ]  denotes negative finding Cardiac  Comments:  Chest pain or chest pressure:    Shortness of breath upon exertion:    Short of breath when lying flat:    Irregular heart rhythm:        Vascular    Pain in calf, thigh, or hip brought on by ambulation:    Pain in feet at night that wakes you up from your sleep:     Blood clot in your veins:    Leg swelling:         Pulmonary    Oxygen at home:    Productive cough:     Wheezing:         Neurologic    Sudden weakness in arms or legs:     Sudden numbness in arms or legs:     Sudden onset of difficulty speaking or slurred speech:    Temporary loss of vision in one eye:     Problems with dizziness:         Gastrointestinal    Blood in stool:     Vomited blood:         Genitourinary    Burning when urinating:     Blood in urine:        Psychiatric    Major depression:  Hematologic    Bleeding problems:    Problems with blood clotting too easily:        Skin    Rashes or ulcers:        Constitutional    Fever or chills:      PHYSICAL EXAM  Vitals:   01/10/21 1034  BP: 124/69  Pulse: 65  Resp: 20  Temp: 98.1 F (36.7 C)  SpO2: 96%  Weight: 92 lb 6.4 oz (41.9 kg)  Height: 5\' 1"  (1.549 m)    Constitutional: Chronically ill appearing. No distress.  Appears under nourished.  Neurologic: CN intact. no focal findings. no sensory loss. Psychiatric: Mood and affect symmetric and appropriate. Eyes: No icterus. No conjunctival pallor. Ears, nose, throat: mucous membranes moist. Midline trachea.  Cardiac: regular rate and rhythm.  Respiratory: unlabored. Abdominal: soft, non-tender, non-distended.  Peripheral vascular:  2+ L radial and brachial pulse  2+ femoral pulses  2+ DP pulses  Extremity: No edema. No cyanosis. No pallor.  Skin: No gangrene. No ulceration.  Lymphatic: No Stemmer's sign. No palpable lymphadenopathy.  PERTINENT LABORATORY AND RADIOLOGIC DATA  Most recent CBC CBC Latest Ref Rng & Units 12/28/2020 12/28/2020 10/24/2020  WBC 4.0 - 10.5 K/uL 9.3 - 7.1  Hemoglobin 12.0 - 15.0 g/dL 12.9 11.9(L) 11.5(L)  Hematocrit 36.0 - 46.0 % 39.1 35.0(L) 35.5(L)  Platelets 150 - 400 K/uL 384 - 555.0(H)     Most recent CMP CMP Latest Ref Rng & Units 12/30/2020 12/29/2020 12/28/2020  Glucose 70 - 99 mg/dL 92 134(H) -  BUN 6 - 20 mg/dL 30(H) 24(H) -  Creatinine 0.44 - 1.00 mg/dL 2.63(H) 2.84(H) 2.67(H)  Sodium 135 - 145 mmol/L 136 135 -  Potassium 3.5 - 5.1 mmol/L 4.4 5.5(H) -  Chloride 98 - 111 mmol/L 111 105 -  CO2 22 - 32 mmol/L 22 23 -  Calcium 8.9 - 10.3 mg/dL 7.8(L) 9.1 -  Total Protein 6.5 - 8.1 g/dL - - -  Total Bilirubin 0.3 - 1.2 mg/dL - - -  Alkaline Phos 38 - 126 U/L - - -  AST 15 - 41 U/L - - -  ALT 0 - 44 U/L - - -    Renal function Estimated Creatinine Clearance: 16.2 mL/min (A) (by C-G formula based on SCr of 2.63 mg/dL (H)).  No results found for: HGBA1C  LDL Chol Calc (NIH)  Date Value Ref Range Status  03/31/2020 51 0 - 99 mg/dL Final       [name]. Stanford Breed, MD [facility] Phone Number: [phone] 01/09/2021 11:43 AM

## 2021-01-10 ENCOUNTER — Encounter: Payer: Self-pay | Admitting: Vascular Surgery

## 2021-01-10 ENCOUNTER — Ambulatory Visit (INDEPENDENT_AMBULATORY_CARE_PROVIDER_SITE_OTHER): Payer: Medicare Other | Admitting: Vascular Surgery

## 2021-01-10 VITALS — BP 124/69 | HR 65 | Temp 98.1°F | Resp 20 | Ht 61.0 in | Wt 92.4 lb

## 2021-01-10 DIAGNOSIS — R1084 Generalized abdominal pain: Secondary | ICD-10-CM

## 2021-01-11 ENCOUNTER — Other Ambulatory Visit: Payer: Self-pay | Admitting: Cardiology

## 2021-01-11 DIAGNOSIS — I1A0 Resistant hypertension: Secondary | ICD-10-CM

## 2021-01-11 DIAGNOSIS — I209 Angina pectoris, unspecified: Secondary | ICD-10-CM

## 2021-01-11 DIAGNOSIS — I10 Essential (primary) hypertension: Secondary | ICD-10-CM

## 2021-01-12 ENCOUNTER — Other Ambulatory Visit: Payer: Self-pay

## 2021-01-12 DIAGNOSIS — R1084 Generalized abdominal pain: Secondary | ICD-10-CM

## 2021-01-16 ENCOUNTER — Ambulatory Visit (INDEPENDENT_AMBULATORY_CARE_PROVIDER_SITE_OTHER)
Admission: RE | Admit: 2021-01-16 | Discharge: 2021-01-16 | Disposition: A | Discharge status: Home / Self Care | Payer: Medicare Other | Source: Ambulatory Visit | Attending: Vascular Surgery | Admitting: Vascular Surgery

## 2021-01-16 ENCOUNTER — Other Ambulatory Visit: Payer: Self-pay

## 2021-01-16 ENCOUNTER — Ambulatory Visit (HOSPITAL_COMMUNITY)
Admission: RE | Admit: 2021-01-16 | Discharge: 2021-01-16 | Disposition: A | Discharge status: Home / Self Care | Payer: Medicare Other | Source: Ambulatory Visit | Attending: Vascular Surgery | Admitting: Vascular Surgery

## 2021-01-16 DIAGNOSIS — R1084 Generalized abdominal pain: Secondary | ICD-10-CM | POA: Insufficient documentation

## 2021-01-16 DIAGNOSIS — Q251 Coarctation of aorta: Secondary | ICD-10-CM

## 2021-01-16 DIAGNOSIS — K551 Chronic vascular disorders of intestine: Secondary | ICD-10-CM

## 2021-01-24 ENCOUNTER — Other Ambulatory Visit: Payer: Self-pay | Admitting: Cardiology

## 2021-01-24 DIAGNOSIS — I10 Essential (primary) hypertension: Secondary | ICD-10-CM

## 2021-01-24 DIAGNOSIS — I209 Angina pectoris, unspecified: Secondary | ICD-10-CM

## 2021-02-01 ENCOUNTER — Encounter (HOSPITAL_COMMUNITY): Payer: Medicare Other

## 2021-02-07 ENCOUNTER — Encounter (HOSPITAL_COMMUNITY): Payer: Medicare Other

## 2021-02-09 ENCOUNTER — Ambulatory Visit (INDEPENDENT_AMBULATORY_CARE_PROVIDER_SITE_OTHER): Payer: Medicare Other | Admitting: Nurse Practitioner

## 2021-02-09 ENCOUNTER — Encounter: Payer: Self-pay | Admitting: Nurse Practitioner

## 2021-02-09 ENCOUNTER — Other Ambulatory Visit (INDEPENDENT_AMBULATORY_CARE_PROVIDER_SITE_OTHER): Payer: Medicare Other

## 2021-02-09 VITALS — BP 142/66 | HR 70 | Ht 61.5 in | Wt 92.0 lb

## 2021-02-09 DIAGNOSIS — Z86010 Personal history of colonic polyps: Secondary | ICD-10-CM | POA: Diagnosis not present

## 2021-02-09 DIAGNOSIS — K625 Hemorrhage of anus and rectum: Secondary | ICD-10-CM

## 2021-02-09 DIAGNOSIS — K551 Chronic vascular disorders of intestine: Secondary | ICD-10-CM | POA: Diagnosis not present

## 2021-02-09 LAB — CBC
HCT: 32.9 % — ABNORMAL LOW (ref 36.0–46.0)
Hemoglobin: 11.2 g/dL — ABNORMAL LOW (ref 12.0–15.0)
MCHC: 34 g/dL (ref 30.0–36.0)
MCV: 96.3 fl (ref 78.0–100.0)
Platelets: 446 10*3/uL — ABNORMAL HIGH (ref 150.0–400.0)
RBC: 3.42 Mil/uL — ABNORMAL LOW (ref 3.87–5.11)
RDW: 16 % — ABNORMAL HIGH (ref 11.5–15.5)
WBC: 9.3 10*3/uL (ref 4.0–10.5)

## 2021-02-09 NOTE — Patient Instructions (Addendum)
If you are age 58 or younger, your body mass index should be between 19-25. Your Body mass index is 17.1 kg/m. If this is out of the aformentioned range listed, please consider follow up with your Primary Care Provider.   LABS:  Lab work has been ordered for you today. Our lab is located in the basement. Press "B" on the elevator. The lab is located at the first door on the left as you exit the elevator.  HEALTHCARE LAWS AND MY CHART RESULTS: Due to recent changes in healthcare laws, you may see the results of your imaging and laboratory studies on MyChart before your provider has had a chance to review them.   We understand that in some cases there may be results that are confusing or concerning to you. Not all laboratory results come back in the same time frame and the provider may be waiting for multiple results in order to interpret others.  Please give [place] 48 hours in order for your provider to thoroughly review all the results before contacting the office for clarification of your results.   We have scheduled you a follow up with Dr. Ardis Hughs on 05/09/21 at 3:20 pm.  RECOMMENDATIONS: Follow up with Dr. Ardis Hughs on 05/09/21. Follow up with your primary care provider for pain management and nutrition referral. Continue Linzess as previously prescribed.  Please call our office if you have black stools or rectal bleeding recurs.  It was great seeing you today! Thank you for entrusting me with your care and choosing [facility].  Noralyn Pick, CRNP

## 2021-02-09 NOTE — Progress Notes (Signed)
02/09/2021 [name] [id number] 1960-10-03   Chief Complaint: follow up abdominal pain, schedule a colonoscopy   History of Present Illness: [name] is a 60 year old female with a past medical history of anxiety, depression, bipolar disorder, hypertension, coronary artery disease s/p MI 2015, AS, bradycardia, peripheral arterial disease with chronic mesenteric ischemia s/p SMA stent, terminal aortic stent and bilateral common iliac artery stent by Dr. Stanford Breed 12/28/2020 on Plavix and ASA, COPD, CKD, GERD, diverticulitis and colon polyps. S/P colon resection secondary to obstruction in 2008 and total abdominal hysterectomy 1997.   She was last seen in office by Dr. Ardis Hughs on 10/24/2020 for further evaluation for postprandial epigastric pain, chronic constipation and 25 lb weight loss over 3 to 4 months. An abdominal sonogram was done on 11/23/2020 to rule out gallstones or biliary abnormality to explain her ongoing abdominal pain.  The sonogram showed a normal gallbladder, the common bile duct measured at 0.3 cm within normal limits and the liver was normal.  Dr. Ardis Hughs suspected her ongoing abdominal pain was secondary to chronic mesenteric ischemia and she was advised to follow up with her vascular specialist Dr. Standley Dakins. 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 She underwent further vascular intervention s/p SMA stent, terminal aortic stent and bilateral common iliac artery stent on  12/28/2020.  She remains on Plavix and aspirin.  Unfortunately, she continues to smoke cigarettes 1ppd which is a significant factor contributing to her severe vascular disease.   She continues to have right upper quadrant and left lower quadrant abdominal pain which is less since her vascular stent procedure. She saw one black solid stool one week ago. She infrequently passes a "brick red" colored stool and  more recently reports passing a normal formed brown stool most a few days weekly. She  Takes Linzess QOD which results in a looser stool. She saw a small amount of red blood on the toilet tissue and in the toilet water 2 weeks ago. Her anemia was thought to most likely be due to chronic GI blood loss from colonic AVMs. See summary of GI procedures below. She received IV iron 2/20222. She does not tolerate oral iron which caused increased abdominal pain and constipation. She has daily nausea. She vomits foamy secretions or undigested food shortly after eating once weekly. No hematemesis. Her worsening renal function is likely contributing to her nausea. She has CKD stage IV followed by nephrologist Dr. Freda Jackson. Her weight is down 4lbs over the past 6 weeks. Today's weight 92 lbs. She reports losing about 30lbs over the past year. She is drinking Ensure 2 eight oz cans daily.   GI procedures: Colonoscopy August 27, 2020 while inpatient, Dr. Bryan Lemma.  "Fair" prep.  20 mm sessile serrated polyp in the ascending was removed with a hot snare, piecemeal technique.  Repeat colonoscopy in 3 months because the bowel preparation was poor and for surveillance after piecemeal polypectomy.  Colonoscopy 08/07/2020 by Dr. Lyndel Safe; with a single nonbleeding colonic angiodysplastic lesion treated with APC, one 8 mm hyperplastic polyp in the distal sigmoid colon, minimal neosigmoid diverticulosis, patent end-to-end colocolonic anastomosis characterized by healthy-appearing mucosa, nonbleeding internal hemorrhoids. It was felt the patient would likely need a repeat colonoscopy in 6 months with a 2-day preparation due to poor visualization.  Enteroscopy August 27, 2020 while inpatient, Dr. Bryan Lemma.  The esophagus and stomach were normal.  Small AVM was found and treated  with APC in the duodenum bulb.  A small AVM was found and treated with APC in the proximal jejunum. 08/06/2020 EGD Dr. Lyndel Safe with food residue in the  stomach with outlet obstruction, otherwise normal. No upper GI bleeding.  Small bowel capsule endoscopy August 24, 2020 while inpatient showed small AVM in the duodenal bulb, small AVM in the proximal jejunum  CT scan abdomen pelvis without IV contrast August 04, 2020 showed diffuse vascular atherosclerosis, trace left pleural effusion, otherwise normal.  CT scan abdomen pelvis without IV contrast August 24, 2020 showed "fluid-filled colon, consistent with diarrheal illness"  Abdominal aortogram with loser extremity peripheral vascular intervention 12/28/2020: 1)US guided bilateral common femoral artery access 2)Aortogram  3) SMA selective angiography 4)Celiac selective angiography (total contrast 99mL) 5)SMA stenting (6x24mm HercuLink) 6)Terminal aortic stenting (8x60mm VBX post dilated to 9mm) 7) Bilateral common iliac artery stenting (7x14mm VBX x 2) 8) Conscious sedation(156 minutes)  Abdominal sonogram 11/23/2020: Gallbladder: No gallstones or wall thickening visualized. No sonographic Murphy sign noted by sonographer.  Common bile duct: Diameter: 0.3 cm, within normal limits  Liver: No focal lesion identified. Within normal limits in parenchymal echogenicity. Portal vein is patent on color Doppler imaging with normal direction of blood flow towards the liver.  IVC: No abnormality visualized.  Pancreas: Visualized portion unremarkable.  Spleen: Size and appearance within normal limits.  Right Kidney: Length: 10.5 cm. Echogenicity is increased. No mass or hydronephrosis visualized.  Left Kidney: Length: 7.7 cm. Diffuse renal cortical thinning. Echogenicity within normal limits. No mass or hydronephrosis visualized.  Abdominal aorta: No aneurysm visualized. Extensive aortic atherosclerotic plaque.  Other findings: None.  IMPRESSION: 1. Diffusely increased echogenicity of the right kidney. Atrophic left kidney with renal cortical thinning.  Findings are concerning for medical renal disease.  2. Extensive aortic atherosclerotic plaque. No aneurysm visualized.   CBC Latest Ref Rng & Units 12/28/2020 12/28/2020 10/24/2020  WBC 4.0 - 10.5 K/uL 9.3 - 7.1  Hemoglobin 12.0 - 15.0 g/dL 12.9 11.9(L) 11.5(L)  Hematocrit 36.0 - 46.0 % 39.1 35.0(L) 35.5(L)  Platelets 150 - 400 K/uL 384 - 555.0(H)    CMP Latest Ref Rng & Units 12/30/2020 12/29/2020 12/28/2020  Glucose 70 - 99 mg/dL 92 134(H) -  BUN 6 - 20 mg/dL 30(H) 24(H) -  Creatinine 0.44 - 1.00 mg/dL 2.63(H) 2.84(H) 2.67(H)  Sodium 135 - 145 mmol/L 136 135 -  Potassium 3.5 - 5.1 mmol/L 4.4 5.5(H) -  Chloride 98 - 111 mmol/L 111 105 -  CO2 22 - 32 mmol/L 22 23 -  Calcium 8.9 - 10.3 mg/dL 7.8(L) 9.1 -  Total Protein 6.5 - 8.1 g/dL - - -  Total Bilirubin 0.3 - 1.2 mg/dL - - -  Alkaline Phos 38 - 126 U/L - - -  AST 15 - 41 U/L - - -  ALT 0 - 44 U/L - - -    Current Outpatient Medications on File Prior to Visit  Medication Sig Dispense Refill  . acetaminophen (TYLENOL) 500 MG tablet Take 1,000 mg by mouth every 8 (eight) hours as needed for mild pain or headache.    . albuterol (PROVENTIL HFA;VENTOLIN HFA) 108 (90 BASE) MCG/ACT inhaler Inhale 2 puffs into the lungs every 6 (six) hours as needed for wheezing or shortness of breath (asthma).    . ALPRAZolam (XANAX) 1 MG tablet Take 0.5 tablets by mouth 2 (two) times daily as needed for anxiety.    Marland Kitchen amLODipine (NORVASC) 10 MG tablet TAKE 1 TABLET BY MOUTH  ONCE DAILY (Patient taking differently: Take 10 mg by mouth daily.) 30 tablet 3  . aspirin EC 81 MG EC tablet Take 1 tablet (81 mg total) by mouth daily. Swallow whole. 30 tablet 11  . atenolol (TENORMIN) 50 MG tablet Take 1 tablet (50 mg total) by mouth daily. 90 tablet 1  . atorvastatin (LIPITOR) 80 MG tablet Take 80 mg by mouth daily.     . clopidogrel (PLAVIX) 75 MG tablet Take 1 tablet (75 mg total) by mouth daily.    Marland Kitchen ezetimibe (ZETIA) 10 MG tablet Take 1 tablet (10 mg total)  by mouth daily. 90 tablet 3  . isosorbide mononitrate (IMDUR) 60 MG 24 hr tablet TAKE 1 TABLET BY MOUTH DAILY (Patient taking differently: Take 60 mg by mouth daily.) 90 tablet 3  . linaclotide (LINZESS) 72 MCG capsule Take 1 capsule (72 mcg total) by mouth daily before breakfast. 30 capsule 2  . megestrol (MEGACE) 20 MG tablet Take 20 mg by mouth daily.    . mometasone-formoterol (DULERA) 100-5 MCG/ACT AERO Take 2 puffs first thing in am and then another 2 puffs about 12 hours later. (Patient taking differently: Inhale 2 puffs into the lungs in the morning and at bedtime.) 1 Inhaler 11  . nitroGLYCERIN (NITROSTAT) 0.4 MG SL tablet Place 1 tablet (0.4 mg total) under the tongue every 5 (five) minutes as needed for chest pain. 30 tablet 1  . oxyCODONE-acetaminophen (PERCOCET/ROXICET) 5-325 MG tablet Take 1 tablet by mouth every 6 (six) hours as needed for moderate pain. 10 tablet 0  . pantoprazole (PROTONIX) 40 MG tablet TAKE 1 TABLET BY MOUTH TWICE (2) DAILY (Patient taking differently: Take 40 mg by mouth 2 (two) times daily.) 180 tablet 2  . promethazine (PHENERGAN) 25 MG tablet Take 25 mg by mouth daily as needed for nausea or vomiting.    . venlafaxine XR (EFFEXOR XR) 150 MG 24 hr capsule Take 1 capsule (150 mg total) by mouth daily with breakfast. (Patient taking differently: Take 300 mg by mouth daily with breakfast.) 30 capsule 0  . azelastine (ASTELIN) 0.1 % nasal spray Place 1 spray into both nostrils daily as needed for rhinitis or allergies.      No current facility-administered medications on file prior to visit.   Allergies  Allergen Reactions  . Doxycycline Anaphylaxis and Hives  . Hydralazine Rash  . Hydrocodone-Acetaminophen Nausea And Vomiting  . Iohexol Itching    Pt has itching nose after iv contrast injection    Current Medications, Allergies, Past Medical History, Past Surgical History, Family History and Social History were reviewed in [employer]  record.   Review of Systems:   Constitutional: + fatigue and weight loss.  Respiratory: Negative for shortness of breath.   Cardiovascular: Negative for chest pain, palpitations and leg swelling.  Gastrointestinal: See HPI.  Musculoskeletal: Negative for back pain or muscle aches.  Neurological: Negative for dizziness, headaches or paresthesias.    Physical Exam: BP (!) 142/66   Pulse 70   Ht 5' 1.5" (1.562 m)   Wt 92 lb (41.7 kg)   BMI 17.10 kg/m   Wt Readings from Last 3 Encounters:  02/09/21 92 lb (41.7 kg)  01/10/21 92 lb 6.4 oz (41.9 kg)  12/28/20 96 lb 12.5 oz (43.9 kg)   General: thin petite 60 year old female in no acute distress. Head: Normocephalic and atraumatic. Eyes: No scleral icterus. Conjunctiva pink . Ears: Normal auditory acuity. Mouth: Dentition intact. No ulcers or lesions.  Lungs: Clear throughout to auscultation. Heart: Regular rate and rhythm. Systolic murmur. Abdomen: Soft, flat abdomen. Mild generalized tenderness without rebound or guarding. No masses or hepatomegaly. Normal bowel sounds x 4 quadrants. Midline lower abdominal scar intact.  Rectal: Deferred.  Musculoskeletal: Symmetrical with no gross deformities. Extremities: No edema. Neurological: Alert oriented x 4. No focal deficits.  Psychological: Alert and cooperative. Normal mood and affect  Assessment and Recommendations:  [age] year old female with a history of colon polyps. Colonoscopy 08/27/2020 identified a 20 mm sessile serrated polyp in the ascending was removed with a hot snare, piecemeal technique. Repeat colonoscopy in 3 months because the bowel preparation was poor and for surveillance after piecemeal polypectomy. -Repeat colonoscopy deferred for now as patient is s/p SMA stent, terminal aortic stent and bilateral common iliac artery stent by 12/28/2020 on Plavix and ASA. -Dr. Ardis Hughs to verify timing of next colonoscopy  -Follow up in office in 2 months  -Patient to call our  office if she develops rectal bleeding or black stools   2. Chronic anemia secondary to CKD and chronic GI blood loss likely due to small bowel and colonic AVMs -CBC, iron, iron saturation, TIBC and Ferritin levels -IV iron as needed, patient intolerant to oral iron   3. Peripheral vascular disease s/p SMA stent, terminal aortic stent and bilateral common iliac artery stent by Dr. Stanford Breed 12/28/2020 on Plavix and ASA. -Smoking cessation strongly encouraged   4. Chronic RUQ and LLQ pain, associated with chronic mesenteric ischemia -Patient has appointment with PCP today for pain management   5. Nausea secondary to renal disease, polypharmacy. Past history of GERD. EGD 11/20/201 showed retained food, may have delayed emptying secondary to chronic narcotic use.  -She is on Pantoprazole 40mg  po bid, consider decreasing to QD in setting or declining renal function. Phenergan  25mg  po bid PRN   6. CKD stage IV. Cr 2.63. Continue follow up with nephrologist  7. Weight loss on Megace -Continue ensure 2 cans daily -Follow up with PCP for nutritionist referral

## 2021-02-10 LAB — IRON,TIBC AND FERRITIN PANEL
%SAT: 15 % (calc) — ABNORMAL LOW (ref 16–45)
Ferritin: 90 ng/mL (ref 16–232)
Iron: 43 ug/dL — ABNORMAL LOW (ref 45–160)
TIBC: 293 mcg/dL (calc) (ref 250–450)

## 2021-02-10 NOTE — Progress Notes (Signed)
Beth, pls contact the patient and let her know Dr. Ardis Hughs would like her to schedule a colonoscopy.  Please schedule her for a colonoscopy with Dr. Ardis Hughs. She will need a 2-day bowel prep.  Please contact her vascular specialist Dr. Luan Pulling to verify Plavix and aspirin instructions prior to her colonoscopy. Refer to her consult note 02/09/2021 for further details.

## 2021-02-10 NOTE — Progress Notes (Signed)
I agree with the above note, plan. I think colonoscopy should be done at her soonest convenience given poor prep 08/2020

## 2021-02-13 ENCOUNTER — Telehealth: Payer: Self-pay

## 2021-02-13 NOTE — Telephone Encounter (Signed)
Pre-operative Risk Assessment     Request for surgical clearance:     Endoscopy Procedure  What type of surgery is being performed?     colonoscopy  When is this surgery scheduled?     04/12/21  What type of clearance is required ?   Pharmacy  Are there any medications that need to be held prior to surgery and how long? Plavix to be held for 5-7 days  Practice name and name of physician performing surgery?      [facility] Dr Owens Loffler  What is your office phone and fax number?      Phone- [phone]  [phone]  Anesthesia type (None, local, MAC, general) ?       MAC

## 2021-02-13 NOTE — Telephone Encounter (Addendum)
-----   Message from Noralyn Pick, NP sent at 02/10/2021  4:49 PM EDT ----- [name], pls contact the patient and let her know Dr. Ardis Hughs would like her to schedule a colonoscopy.  Please schedule her for a colonoscopy with Dr. Ardis Hughs. She will need a 2-day bowel prep.  Please contact her vascular specialist Dr. Luan Pulling to verify Plavix and aspirin instructions prior to her colonoscopy. Refer to her consult note 02/09/2021 for further details.

## 2021-02-13 NOTE — Telephone Encounter (Addendum)
Spoke with the patient. She agrees to 03/27/21 for Pre-visit and 04/12/21 for her procedure date. Anticoagulation management request routed to Dr Stanford Breed.

## 2021-03-03 ENCOUNTER — Ambulatory Visit: Payer: Medicare Other | Admitting: Gastroenterology

## 2021-03-08 NOTE — Progress Notes (Signed)
Primary Physician/Referring:  Bernerd Limbo, MD  [name], female    DOB: 1961/02/04, 60 y.o.   MRN: [id number]  Chief Complaint  Patient presents with   Hypertension   Renal Artery Stenosis   Follow-up   Leg Pain   HPI:    [name]  is a 60 y.o. Caucasian female  with hypertension, COPD, diastolic CHF, ongoing tobacco abuse (2 cig a day), and difficult to control hypertension with CKD, with bilateral renal artery angioplasty, PAD with distal abdominal aorta stenosis S/P stenting with 10x 29 mm Omnilink Elite stent. She underwent coronary angiogram in June 2019 for nitroglycerin responsive chest pain that revealed normal coronary arteries.   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  She underwent a partially successful balloon angioplasty with antegrade dissection noted in the renal artery and hopefully this has remained patent. Patient was admitted with lower GI bleed on 08/04/2020 requiring 2 units of packed RBCs and again on 08/23/2020 and found to have colonic polyposis.   She was admitted again 12/28/2020 for elective stenting of SMA, terminal aorta, bilateral common iliac arteries given chronic mesenteric ischemia. She continues to follow with vascular surgery with repeat enteric duplex and ABI pending.  She also continues to follow with gastroenterology who has recommended colonoscopy next month.  Patient was last seen in our office 09/06/2020 by Dr. Einar Gip at which time aspirin was discontinued due to recurrent GI bleed.  Patient now presents for 58-monthfollow-up.  Patient continues to follow closely with GI as well as vascular surgery for evaluation and management of mesenteric ischemia, colonic polyps, and PAD.  Patient states overall she is feeling that she has an underlying.  In fact she has been able to work on deep cleaning her house  which the patient was very excited about.  She does report she continues to have intermittent dark stools, she has upcoming colonoscopy scheduled for further evaluation.  Patient was recently seen by nephrology who agrees with blood pressure control goal of <130/80 mmHg, however no changes were made to patient's medications at that time.  Upon recent discharge from the hospital patient was advised to continue Imdur 60 mg once daily, however this is inadvertently been discontinued by patient.  In regard to palpitations, patient reports palpitations have essentially resolved with increased dose of atenolol at last visit.  Patient denies chest pain, dyspnea, syncope, near syncope, dizziness.  She continues to have intermittent minimal bilateral lower leg edema.  Unfortunately patient does continue to smoke approximately a pack per day of cigarettes. She reports home BP readings averaging 140/84 mmHg.   Past Medical History:  Diagnosis Date   Anemia 2008   Anxiety    Aortic stenosis    Arthritis    "all my joints ache" (09/07/2014)   Asthma    Bipolar 1 disorder (HCC)    Bradycardia    Bruit    Carpal tunnel syndrome    Cervical cancer (HCC) 1985   CHF (congestive heart failure) (HCC)    Chronic kidney disease (CKD), stage V (HCC)    COPD (chronic obstructive pulmonary disease) (HIone 2000   Coronary artery disease    Daily headache    Depression 2000   Diverticulitis 2008   GERD (gastroesophageal reflux disease)    Glaucoma    Heart murmur    History of colon polyps 2009   HLD (hyperlipidemia)  2013   Hypertension 2013   Hypovitaminosis D    IBS (irritable bowel syndrome) 2008   Myocardial infarction [facility])    2015   PAD (peripheral artery disease) (HCC)    Pancreatitis 10/2011   Pneumonia 07/2014   RLS (restless legs syndrome)    Schizophrenia (HCC)    Shortness of breath    Skin cancer    Small bowel obstruction ([place]) 2008   Past Surgical History:  Procedure Laterality Date    ABDOMINAL ANGIOGRAM N/A 09/07/2014   Procedure: ABDOMINAL ANGIOGRAM;  Surgeon: Laverda Page, MD;  Location: [facility] CATH LAB;  Service: Cardiovascular;  Laterality: N/A;   ABDOMINAL AORTOGRAM W/LOWER EXTREMITY N/A 12/28/2020   Procedure: ABDOMINAL AORTOGRAM W/LOWER EXTREMITY;  Surgeon: Cherre Robins, MD;  Location: [place] CV LAB;  Service: Cardiovascular;  Laterality: N/A;   APPENDECTOMY  1996   BIOPSY  08/06/2020   Procedure: BIOPSY;  Surgeon: Jackquline Denmark, MD;  Location: [facility] ENDOSCOPY;  Service: Endoscopy;;   COLON SURGERY  2008   6 inches of colon removed due to obstruction   COLONOSCOPY WITH PROPOFOL N/A 10/25/2016   Procedure: COLONOSCOPY WITH PROPOFOL;  Surgeon: Milus Banister, MD;  Location: [facility] ENDOSCOPY;  Service: Endoscopy;  Laterality: N/A;   COLONOSCOPY WITH PROPOFOL N/A 08/07/2020   Procedure: COLONOSCOPY WITH PROPOFOL;  Surgeon: Jackquline Denmark, MD;  Location: [facility] ENDOSCOPY;  Service: Endoscopy;  Laterality: N/A;   COLONOSCOPY WITH PROPOFOL N/A 08/27/2020   Procedure: COLONOSCOPY WITH PROPOFOL;  Surgeon: Lavena Bullion, DO;  Location: [facility] ENDOSCOPY;  Service: Gastroenterology;  Laterality: N/A;   CORONARY ANGIOPLASTY WITH STENT PLACEMENT  09/07/2014   "2"   ENTEROSCOPY N/A 08/27/2020   Procedure: ENTEROSCOPY;  Surgeon: Lavena Bullion, DO;  Location: [facility] ENDOSCOPY;  Service: Gastroenterology;  Laterality: N/A;  Push enteroscopy    ESOPHAGOGASTRODUODENOSCOPY (EGD) WITH PROPOFOL N/A 08/06/2020   Procedure: ESOPHAGOGASTRODUODENOSCOPY (EGD) WITH PROPOFOL;  Surgeon: Jackquline Denmark, MD;  Location: [facility] ENDOSCOPY;  Service: Endoscopy;  Laterality: N/A;   GIVENS CAPSULE STUDY N/A 08/24/2020   Procedure: GIVENS CAPSULE STUDY;  Surgeon: Lavena Bullion, DO;  Location: [facility] ENDOSCOPY;  Service: Gastroenterology;  Laterality: N/A;   HOT HEMOSTASIS N/A 08/07/2020   Procedure: HOT HEMOSTASIS (ARGON PLASMA COAGULATION/BICAP);  Surgeon: Jackquline Denmark, MD;  Location: [facility] ENDOSCOPY;  Service:  Endoscopy;  Laterality: N/A;   HOT HEMOSTASIS N/A 08/27/2020   Procedure: HOT HEMOSTASIS (ARGON PLASMA COAGULATION/BICAP);  Surgeon: Lavena Bullion, DO;  Location: [facility] ENDOS[facility];  Service: Gastroenterology;  Laterality: N/A;   LEFT HEART CATH AND CORONARY ANGIOGRAPHY N/A 03/25/2018   Procedure: LEFT HEART CATH AND CORONARY ANGIOGRAPHY;  Surgeon: Nigel Mormon, MD;  Location: [place] CV LAB;  Service: Cardiovascular;  Laterality: N/A;   LEFT HEART CATHETERIZATION WITH CORONARY ANGIOGRAM N/A 09/07/2014   Procedure: LEFT HEART CATHETERIZATION WITH CORONARY ANGIOGRAM;  Surgeon: Laverda Page, MD;  Location: [facility] CATH LAB;  Service: Cardiovascular;  Laterality: N/A;   LOWER EXTREMITY ANGIOGRAPHY  10/29/2017   Procedure: Lower Extremity Angiography;  Surgeon: Adrian Prows, MD;  Location: [place] CV LAB;  Service: Cardiovascular;;   PERIPHERAL VASCULAR CATHETERIZATION N/A 11/01/2015   Procedure: Renal Angiography;  Surgeon: Adrian Prows, MD;  Location: [place] CV LAB;  Service: Cardiovascular;  Laterality: N/A;   PERIPHERAL VASCULAR CATHETERIZATION N/A 04/10/2016   Procedure: Renal Angiography;  Surgeon: Adrian Prows, MD;  Location: [place] CV LAB;  Service: Cardiovascular;  Laterality: N/A;   PERIPHERAL VASCULAR CATHETERIZATION  04/10/2016   Procedure: Peripheral  Vascular Intervention;  Surgeon: Adrian Prows, MD;  Location: [place] CV LAB;  Service: Cardiovascular;;   PERIPHERAL VASCULAR INTERVENTION  10/29/2017   Procedure: PERIPHERAL VASCULAR INTERVENTION;  Surgeon: Adrian Prows, MD;  Location: [place] CV LAB;  Service: Cardiovascular;;   PERIPHERAL VASCULAR INTERVENTION Bilateral 12/28/2020   Procedure: PERIPHERAL VASCULAR INTERVENTION;  Surgeon: Cherre Robins, MD;  Location: [place] CV LAB;  Service: Cardiovascular;  Laterality: Bilateral;   POLYPECTOMY  08/07/2020   Procedure: POLYPECTOMY;  Surgeon: Jackquline Denmark, MD;  Location: [facility] ENDOS[facility];  Service: Endoscopy;;    POLYPECTOMY  08/27/2020   Procedure: POLYPECTOMY;  Surgeon: Lavena Bullion, DO;  Location: [facility] ENDOS[facility];  Service: Gastroenterology;;   RENAL ANGIOGRAPHY N/A 10/29/2017   Procedure: RENAL ANGIOGRAPHY;  Surgeon: Adrian Prows, MD;  Location: [place] CV LAB;  Service: Cardiovascular;  Laterality: N/A;   RENAL ANGIOGRAPHY N/A 05/10/2020   Procedure: RENAL ANGIOGRAPHY;  Surgeon: Nigel Mormon, MD;  Location: [place] CV LAB;  Service: Cardiovascular;  Laterality: N/A;   RIGHT OOPHORECTOMY Right 1996   TOTAL ABDOMINAL HYSTERECTOMY  1997   Family History  Problem Relation Age of Onset   Other Mother        many bowel obstructions   Heart disease Mother    Colon polyps Mother    Kidney cancer Father    Bone cancer Father    Diabetes Father    Heart disease Father    Diabetes Daughter    Colon cancer Paternal Grandfather    Heart disease Brother    Esophageal cancer Neg Hx     Social History   Tobacco Use   Smoking status: Some Days    Packs/day: 1.00    Years: 40.00    Pack years: 40.00    Types: Cigarettes   Smokeless tobacco: Never  Substance Use Topics   Alcohol use: No   Marital Status: Divorced  ROS  Review of Systems  Constitutional: Negative for malaise/fatigue and weight gain.  Cardiovascular:  Positive for dyspnea on exertion (improved). Negative for chest pain, claudication, leg swelling, near-syncope, orthopnea, palpitations, paroxysmal nocturnal dyspnea and syncope.  Respiratory:  Negative for cough and shortness of breath.   Hematologic/Lymphatic: Does not bruise/bleed easily.  Musculoskeletal:  Positive for muscle cramps (occasionial night cramps). Negative for joint swelling.  Gastrointestinal:  Positive for abdominal pain. Negative for heartburn and melena.  Neurological:  Negative for dizziness and weakness.  Psychiatric/Behavioral:  Positive for depression.   Objective  Blood pressure (!) 149/80, pulse 60, temperature 98.3 F (36.8 C), height  5' 1.5" (1.562 m), weight 91 lb 9.6 oz (41.5 kg), SpO2 97 %.  Vitals with BMI 03/09/2021 02/09/2021 01/10/2021  Height 5' 1.5" 5' 1.5" '5\' 1"'   Weight 91 lbs 10 oz 92 lbs 92 lbs 6 oz  BMI 17.03 72.0 94.70  Systolic 962 836 629  Diastolic 80 66 69  Pulse 60 70 65     Physical Exam Vitals reviewed.  Constitutional:      General: She is not in acute distress.    Appearance: She is underweight.  Cardiovascular:     Rate and Rhythm: Normal rate and regular rhythm.     Pulses:          Carotid pulses are  on the right side with bruit and  on the left side with bruit.      Femoral pulses are 2+ on the right side and 2+ on the left side.      Popliteal  pulses are 2+ on the right side and 2+ on the left side.       Dorsalis pedis pulses are 0 on the right side and 2+ on the left side.       Posterior tibial pulses are 0 on the right side and 2+ on the left side.     Heart sounds: Normal heart sounds. No murmur heard.   No gallop.     Comments: No leg edema. No JVD.   Pulmonary:     Effort: Pulmonary effort is normal. No accessory muscle usage or respiratory distress.     Breath sounds: Normal breath sounds.  Chest:    Musculoskeletal:     Right lower leg: Edema (trace) present.     Left lower leg: Edema (trace) present.  Neurological:     Mental Status: She is alert.   Laboratory examination:   CMP Latest Ref Rng & Units 12/30/2020 12/29/2020 12/28/2020  Glucose 70 - 99 mg/dL 92 134(H) -  BUN 6 - 20 mg/dL 30(H) 24(H) -  Creatinine 0.44 - 1.00 mg/dL 2.63(H) 2.84(H) 2.67(H)  Sodium 135 - 145 mmol/L 136 135 -  Potassium 3.5 - 5.1 mmol/L 4.4 5.5(H) -  Chloride 98 - 111 mmol/L 111 105 -  CO2 22 - 32 mmol/L 22 23 -  Calcium 8.9 - 10.3 mg/dL 7.8(L) 9.1 -  Total Protein 6.5 - 8.1 g/dL - - -  Total Bilirubin 0.3 - 1.2 mg/dL - - -  Alkaline Phos 38 - 126 U/L - - -  AST 15 - 41 U/L - - -  ALT 0 - 44 U/L - - -   CBC Latest Ref Rng & Units 02/09/2021 12/28/2020 12/28/2020  WBC 4.0 - 10.5 K/uL 9.3  9.3 -  Hemoglobin 12.0 - 15.0 g/dL 11.2(L) 12.9 11.9(L)  Hematocrit 36.0 - 46.0 % 32.9(L) 39.1 35.0(L)  Platelets 150.0 - 400.0 K/uL 446.0(H) 384 -   Lipid Panel Recent Labs    03/31/20 1336  CHOL 119  TRIG 92  LDLCALC 51  HDL 50    HEMOGLOBIN A1C No results found for: HGBA1C, MPG TSH No results for input(s): TSH in the last 8760 hours.   External labs:   Lab 03/01/2020:  Hb 10.9/HCT 34.4, normal indicis, platelet counts 376.  Serum glucose 100 mg, BUN 21, creatinine 2.59, EGFR 20 mL.  Sodium 140, potassium 5.9.  LFTs normal. Allergies   Allergies  Allergen Reactions   Doxycycline Anaphylaxis and Hives   Hydralazine Rash   Hydrocodone-Acetaminophen Nausea And Vomiting   Iohexol Itching    Pt has itching nose after iv contrast injection      Medications Prior to Visit:   Outpatient Medications Prior to Visit  Medication Sig Dispense Refill   acetaminophen (TYLENOL) 500 MG tablet Take 1,000 mg by mouth every 8 (eight) hours as needed for mild pain or headache.     albuterol (PROVENTIL HFA;VENTOLIN HFA) 108 (90 BASE) MCG/ACT inhaler Inhale 2 puffs into the lungs every 6 (six) hours as needed for wheezing or shortness of breath (asthma).     ALPRAZolam (XANAX) 1 MG tablet Take 0.5 tablets by mouth 2 (two) times daily as needed for anxiety.     amLODipine (NORVASC) 10 MG tablet TAKE 1 TABLET BY MOUTH ONCE DAILY (Patient taking differently: Take 10 mg by mouth daily.) 30 tablet 3   aspirin EC 81 MG EC tablet Take 1 tablet (81 mg total) by mouth daily. Swallow whole. 30 tablet 11   atenolol (  TENORMIN) 50 MG tablet Take 1 tablet (50 mg total) by mouth daily. 90 tablet 1   atorvastatin (LIPITOR) 80 MG tablet Take 80 mg by mouth daily.      azelastine (ASTELIN) 0.1 % nasal spray Place 1 spray into both nostrils daily as needed for rhinitis or allergies.      clopidogrel (PLAVIX) 75 MG tablet Take 1 tablet (75 mg total) by mouth daily.     ezetimibe (ZETIA) 10 MG tablet Take 1  tablet (10 mg total) by mouth daily. 90 tablet 3   linaclotide (LINZESS) 72 MCG capsule Take 1 capsule (72 mcg total) by mouth daily before breakfast. 30 capsule 2   megestrol (MEGACE) 20 MG tablet Take 20 mg by mouth daily.     mometasone-formoterol (DULERA) 100-5 MCG/ACT AERO Take 2 puffs first thing in am and then another 2 puffs about 12 hours later. (Patient taking differently: Inhale 2 puffs into the lungs in the morning and at bedtime.) 1 Inhaler 11   nitroGLYCERIN (NITROSTAT) 0.4 MG SL tablet Place 1 tablet (0.4 mg total) under the tongue every 5 (five) minutes as needed for chest pain. 30 tablet 1   oxyCODONE-acetaminophen (PERCOCET/ROXICET) 5-325 MG tablet Take 1 tablet by mouth every 6 (six) hours as needed for moderate pain. 10 tablet 0   pantoprazole (PROTONIX) 40 MG tablet TAKE 1 TABLET BY MOUTH TWICE (2) DAILY (Patient taking differently: Take 40 mg by mouth 2 (two) times daily.) 180 tablet 2   promethazine (PHENERGAN) 25 MG tablet Take 25 mg by mouth daily as needed for nausea or vomiting.     venlafaxine XR (EFFEXOR XR) 150 MG 24 hr capsule Take 1 capsule (150 mg total) by mouth daily with breakfast. (Patient taking differently: Take 300 mg by mouth daily with breakfast.) 30 capsule 0   amLODipine (NORVASC) 10 MG tablet Take 1 tablet by mouth daily.     megestrol (MEGACE) 40 MG tablet Take by mouth.     isosorbide dinitrate (ISORDIL) 30 MG tablet Take 1 tablet by mouth daily. (Patient not taking: Reported on 03/09/2021)     isosorbide mononitrate (IMDUR) 60 MG 24 hr tablet TAKE 1 TABLET BY MOUTH DAILY (Patient not taking: Reported on 03/09/2021) 90 tablet 3   No facility-administered medications prior to visit.     Final Medications at End of Visit    Current Meds  Medication Sig   acetaminophen (TYLENOL) 500 MG tablet Take 1,000 mg by mouth every 8 (eight) hours as needed for mild pain or headache.   albuterol (PROVENTIL HFA;VENTOLIN HFA) 108 (90 BASE) MCG/ACT inhaler Inhale 2  puffs into the lungs every 6 (six) hours as needed for wheezing or shortness of breath (asthma).   ALPRAZolam (XANAX) 1 MG tablet Take 0.5 tablets by mouth 2 (two) times daily as needed for anxiety.   amLODipine (NORVASC) 10 MG tablet TAKE 1 TABLET BY MOUTH ONCE DAILY (Patient taking differently: Take 10 mg by mouth daily.)   aspirin EC 81 MG EC tablet Take 1 tablet (81 mg total) by mouth daily. Swallow whole.   atenolol (TENORMIN) 50 MG tablet Take 1 tablet (50 mg total) by mouth daily.   atorvastatin (LIPITOR) 80 MG tablet Take 80 mg by mouth daily.    azelastine (ASTELIN) 0.1 % nasal spray Place 1 spray into both nostrils daily as needed for rhinitis or allergies.    clopidogrel (PLAVIX) 75 MG tablet Take 1 tablet (75 mg total) by mouth daily.   ezetimibe (ZETIA) 10  MG tablet Take 1 tablet (10 mg total) by mouth daily.   linaclotide (LINZESS) 72 MCG capsule Take 1 capsule (72 mcg total) by mouth daily before breakfast.   megestrol (MEGACE) 20 MG tablet Take 20 mg by mouth daily.   mometasone-formoterol (DULERA) 100-5 MCG/ACT AERO Take 2 puffs first thing in am and then another 2 puffs about 12 hours later. (Patient taking differently: Inhale 2 puffs into the lungs in the morning and at bedtime.)   nitroGLYCERIN (NITROSTAT) 0.4 MG SL tablet Place 1 tablet (0.4 mg total) under the tongue every 5 (five) minutes as needed for chest pain.   oxyCODONE-acetaminophen (PERCOCET/ROXICET) 5-325 MG tablet Take 1 tablet by mouth every 6 (six) hours as needed for moderate pain.   pantoprazole (PROTONIX) 40 MG tablet TAKE 1 TABLET BY MOUTH TWICE (2) DAILY (Patient taking differently: Take 40 mg by mouth 2 (two) times daily.)   promethazine (PHENERGAN) 25 MG tablet Take 25 mg by mouth daily as needed for nausea or vomiting.   venlafaxine XR (EFFEXOR XR) 150 MG 24 hr capsule Take 1 capsule (150 mg total) by mouth daily with breakfast. (Patient taking differently: Take 300 mg by mouth daily with breakfast.)    [DISCONTINUED] amLODipine (NORVASC) 10 MG tablet Take 1 tablet by mouth daily.   [DISCONTINUED] megestrol (MEGACE) 40 MG tablet Take by mouth.    Radiology:   No results found.  Cardiac Studies:   Renal arteriogram 02/29/2018: Widely patient stents. 04/10/2016: Scoring balloon angioplasty left in-stent restenosis with 6 x 20 mm angiosculpt balloon.  Right renal artery stent widely patent. H/O bilateral renal stenting on 09/07/2014: Right renal artery 6.0 x 15 mm Herculink widely patent, left renal artery  6.0 x 18 mm Herculink stent.  Renal artery duplex 09/18/2017: Hemodynamically significant stenosis of the right renal artery. Peak Velocity of 308/66 cm/s. Normal intrarenal vascular perfusion is noted in both kidneys. There is increased echogenecity of both kidneys suggests medico-renal disease with right kidney slightly shrunk at 8.03x3.97x4 cm compared to 9.144.664.6 cm Diffuse plaque noted in the abdominal aorta with calcification.  Compared to the study done on 01/22/2017, left renal artery stenosis not evident, right renal artery stenosis new.  Patient has h/o bilateral renal artery stenting.  Abdominal aortogram 10/29/2017: Widely patent renal arteries. Distal abdominal aorta diffuse disease, calcification with 80% stenosis S/P 10 x 29 mm Omnilink Elite stent, 80% to 0%.    Lexiscan myoview stress test 02/17/2018:  1. Lexiscan stress test was performed. Exercise capacity was not assessed. Stress symptoms included dizziness, nausea, headache, and chest pressure.. Peak blood pressure was 176/88 mmHg. Stress EKG is non diagnostic for ischemia as it is a pharmacologic stress. In addition, it showed The stress electrocardiogram showed sinus tachycardia, normal stress conduction, left ventricular hypertrophy, no stress arrhythmias and normal stress repolarization. 2. The overall quality of the study is excellent. Left ventricular cavity is noted to be normal on the rest and stress studies.  Gated SPECT images reveal normal myocardial thickening and wall motion. The left ventricular ejection fraction was calculated or visually estimated to be 78%. SPECT images demonstrate small perfusion abnormality of moderate intensity in the mid anterolateral and apical lateral myocardial wall(s) on the stress images, reversible on rest images. Findings suggest small area of moderate intensity ischemia in mid to apical anterolateral myocardium. Intermediate risk study.  Cardiac Cath 03/25/2018: Normal coronary arteries.  Lower extremity arterial duplex 12/05/2018: No hemodynamically significant stenoses are identified in the lower extremity arterial system.  This  exam reveals moderately decreased perfusion of the right lower extremity, noted at the dorsalis pedis and post tibial artery level (ABI 0.68) and mildly decreased perfusion of the left lower extremity, noted at the post tibial artery level (ABI 0.83). Triphasic waveform the the major vessels suggest diffuse small vessel disease. Left proximal SFA has < 50% stenosis.  208/06/2016, mild progression of PAD with decreased ABI bilaterally compared to left ABI 0.95 and right ABI 0.94.   Abdominal Aortic Duplex  05/27/2019: Moderate plaque noted in the proximal, mid and distal aorta.  No abdominal aortic aneurysm. Distal abdominal aortic velocity is mildly elevated suggestive of <50% stenosis. Patient has h/o distal abdominal aortic stenosis and stenting on 10/29/2017. The stent appears patent.   Echocardiogram 10/16/2019:  Normal LV systolic function with EF 60%. Left ventricle cavity is normal in size. Normal global wall motion. Normal diastolic filling pattern.  Calculated EF 60%.  Left atrial cavity is mildly dilated in 4 chamber views.  Structurally normal mitral valve.  Mild (Grade I) mitral regurgitation.  Structurally normal tricuspid valve.  Mild tricuspid regurgitation. No evidence of pulmonary hypertension.  Compared to 02/25/2017, no  significant change.   Renal angiogram 05/10/2020: Patent Rt renal stent (placed 2015,  6.0 x 15 mm Herculink) 100% restenosis of Lt renals stent with flush occlusion 6.0 x 18 mm Herculink)   Successful angioplasty Lt renal stent Antegrade dissection in distal Lt renal artery with decreased distal flow, too distal for stenting. There is blush in left kidney.  She will be monitored for 6-8 hrs for any signs of renal infarct.   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 Stenosis in the  right external carotid artery (<50%).  Stenosis in the left internal carotid artery (16-49%). Stenosis in the  left external carotid artery (<50%).  Antegrade right vertebral artery flow. Antegrade left vertebral artery  flow.  Follow up in 12 months is appropriate if clinically indicated.  Compared to the study done on 04/08/2020, left ICA stenosis was in the  range of 50-69%., now <50%.  Mesenteric Duplex 12/20/2020: :  Velocities suggest 70 to 99% stenosis in the superior mesenteric artery and >50% stenosis in the distal aorta. Extensive plaque was noted throughout abdominal aorta.   Abdominal aortogram with lower extremity and peripheral vascular intervention 12/28/2020: Aortogram reveals prior terminal aortic stenting prior renal stenting CO2 aortography performed for majority of diagnostic studies 70% SMA stenosis. 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 95% right common iliac stenosis Unremarkable SMA stenting. Unable to track a stent into the celiac artery.  I elected to not continue because of the risk of contrast nephropathy. Right common iliac stenosis stented.  Post stenting we noted perforation with extravasation in the right common iliac artery as well as ulceration in the aortic stent. I elected to cover all of these lesions with terminal aortic stenting and bilateral common iliac artery stenting. Angiogram shows excellent flow through the terminal aorta and  through the aortic bifurcation.  PLAN: Successful SMA revascularization.  Unfortunately required aorto and bilateral common iliac artery stenting.  Admit 4E. Needs dual antiplatelet therapy and high intensity statin going forward.  Continue IV hydration.  Check basic metabolic panel in the morning.  Give diet and see how she tolerates food.  Mesenteric duplex 01/16/2021: SMA stent not well visualized, however SMA is patent with no evidence of stenosis. Aortoiliac stent is patent with no evidence of stenosis. >50% stenosis is noted distal to the right CIA stent.  EKG  03/09/2021: Sinus bradycardia at a rate of 57 bpm.  Left atrial enlargement.  Normal axis.  Poor R wave progression, cannot exclude anteroseptal infarct old.  LVH with secondary ST-T wave changes  EKG 03/24/2020: Normal sinus rhythm with rate of 58 bpm, normal axis, no evidence of ischemia.  Borderline criteria for LVH with R in V5 26 mm.   No significant change from EKG 02/10/2020.  Assessment     ICD-10-CM   1. Asymptomatic bilateral carotid artery stenosis  I65.23 EKG 12-Lead    2. Abdominal aortic stenosis  Q25.1     3. Resistant hypertension  I10 EKG 12-Lead    isosorbide mononitrate (IMDUR) 60 MG 24 hr tablet    4. Bilateral renal artery stenosis (HCC)  I70.1     5. Peripheral arterial disease (HCC)  I73.9 EKG 12-Lead    6. Nicotine dependence, cigarettes, uncomplicated  Z66.294       Meds ordered this encounter  Medications   isosorbide mononitrate (IMDUR) 60 MG 24 hr tablet    Sig: Take 1 tablet (60 mg total) by mouth daily.    Dispense:  90 tablet    Refill:  3    Medications Discontinued During This Encounter  Medication Reason   amLODipine (NORVASC) 10 MG tablet Duplicate   megestrol (MEGACE) 40 MG tablet Duplicate   isosorbide dinitrate (ISORDIL) 30 MG tablet Change in therapy   isosorbide mononitrate (IMDUR) 60 MG 24 hr tablet Reorder    Recommendations:   [name]  is a 60 y.o.  Caucasian female  with hypertension, COPD, diastolic CHF, ongoing tobacco abuse (2 cig a day), and difficult to control hypertension with CKD, with bilateral renal artery angioplasty, PAD with distal abdominal aorta stenosis S/P stenting with 10x 29 mm Omnilink Elite stent. She underwent coronary angiogram in June 2019 for nitroglycerin responsive chest pain that revealed normal coronary arteries.   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  She underwent a partially successful balloon angioplasty with antegrade dissection noted in the renal artery and hopefully this has remained patent. Patient was admitted with lower GI bleed on 08/04/2020 requiring 2 units of packed RBCs and again on 08/23/2020 and found to have colonic polyposis.   She was admitted again 12/28/2020 for elective stenting of SMA, terminal aorta, bilateral common iliac arteries given chronic mesenteric ischemia. She continues to follow with vascular surgery with repeat enteric duplex and ABI pending.  She also continues to follow with gastroenterology who has recommended colonoscopy next month.  Patient was last seen in our office 09/06/2020 by Dr. Einar Gip at which time aspirin was discontinued due to recurrent GI bleed.  Patient now presents for 66-monthfollow-up.  Patient is feeling relatively well despite ongoing GI issues as well as PAD, for which she follows with vascular surgery.  Will defer further management to vascular at this time, however cussed with patient and happy to follow her for PAD when she is released from vascular surgery.  Patient's blood pressure remains uncontrolled, blood pressure goal is <130/80 mmHg.  Patient inadvertently stopped Imdur recently.  We will therefore restart  Imdur 60 mg once daily.  Patient will continue to monitor blood pressure on a regular basis.  Patient agrees to notify our  office if blood pressure remains elevated >130/80 mmHg with addition of Imdur.  Patient is otherwise stable from a cardiac standpoint, we will therefore not  make any additional changes to her medications.  Unfortunately patient does continue to smoke approximately a pack per day, spent 5 minutes counseling regarding tobacco cessation.  Patient appears motivated to quit, however continues to struggle.  Offered nicotine replacement therapy, patient refused.  Follow-up in 3 months, sooner if needed, for hypertension, PAD, renal artery stenosis, abdominal aortic stenosis, and carotid artery stenosis (due for duplex in Jan 2023).    Cheyenne Berthold, PA-C 03/09/2021, 2:00 PM Office: [phone]

## 2021-03-09 ENCOUNTER — Encounter: Payer: Self-pay | Admitting: Student

## 2021-03-09 ENCOUNTER — Ambulatory Visit: Payer: Medicare Other | Admitting: Student

## 2021-03-09 ENCOUNTER — Other Ambulatory Visit: Payer: Self-pay

## 2021-03-09 VITALS — BP 149/80 | HR 60 | Temp 98.3°F | Ht 61.5 in | Wt 91.6 lb

## 2021-03-09 DIAGNOSIS — I739 Peripheral vascular disease, unspecified: Secondary | ICD-10-CM

## 2021-03-09 DIAGNOSIS — I10 Essential (primary) hypertension: Secondary | ICD-10-CM

## 2021-03-09 DIAGNOSIS — Q251 Coarctation of aorta: Secondary | ICD-10-CM

## 2021-03-09 DIAGNOSIS — I6523 Occlusion and stenosis of bilateral carotid arteries: Secondary | ICD-10-CM

## 2021-03-09 DIAGNOSIS — I701 Atherosclerosis of renal artery: Secondary | ICD-10-CM

## 2021-03-09 DIAGNOSIS — F17210 Nicotine dependence, cigarettes, uncomplicated: Secondary | ICD-10-CM

## 2021-03-09 MED ORDER — ISOSORBIDE MONONITRATE ER 60 MG PO TB24: 60.0000 mg | ORAL_TABLET | Freq: Every day | ORAL | 3 refills | Status: DC

## 2021-03-13 ENCOUNTER — Encounter (HOSPITAL_COMMUNITY): Payer: Self-pay

## 2021-03-14 ENCOUNTER — Telehealth: Payer: Self-pay | Admitting: *Deleted

## 2021-03-14 NOTE — Telephone Encounter (Signed)
Dr. Ardis Hughs,  This pt is coming in for a PV on 03-27-21 and her colonoscopy is on 04-12-21.  I spoke with her and she is still on Plavix and plans to continue taking it.  Per the TE on 02-13-21, Dr. Stanford Breed addressed her Plavix.  Could you take a look at that TE?   I just wanted to make sure I had her Plavix instructions clear for her procedure.  Her procedure is past the 3 month mark but she still will be on it.    Thanks, [employer]

## 2021-03-14 NOTE — Telephone Encounter (Signed)
Also, she had her last colonoscopy at the hospital and is scheduled here at the [facility].  Just checking to make sure she is ok for [place]

## 2021-03-16 ENCOUNTER — Ambulatory Visit: Payer: Self-pay | Admitting: Urology

## 2021-03-20 NOTE — Telephone Encounter (Signed)
Spoke with pt- instructed her per Dr Stanford Breed per Radonna Ricker notes she needs to take Plavix through 6-30 then she can  stop the Plavix- PV 6-27, OV with [place] 7-12 and colon 7-13 pt verbalized understanding

## 2021-03-20 NOTE — Telephone Encounter (Signed)
I reviewed the note from Dr. Luan Pulling.  He says it is okay for her to completely stop the Plavix after 3 months which would be June 30.  She is okay to be done in [facility], the last procedure was done in the hospital because she happened to be an inpatient at the time.  Thanks

## 2021-03-22 ENCOUNTER — Other Ambulatory Visit: Payer: Self-pay

## 2021-03-22 ENCOUNTER — Ambulatory Visit (INDEPENDENT_AMBULATORY_CARE_PROVIDER_SITE_OTHER): Payer: Medicare Other | Admitting: Urology

## 2021-03-22 ENCOUNTER — Encounter: Payer: Self-pay | Admitting: Urology

## 2021-03-22 VITALS — BP 104/62 | HR 8 | Ht 61.0 in | Wt 90.0 lb

## 2021-03-22 DIAGNOSIS — N200 Calculus of kidney: Secondary | ICD-10-CM | POA: Diagnosis not present

## 2021-03-22 DIAGNOSIS — R1031 Right lower quadrant pain: Secondary | ICD-10-CM

## 2021-03-22 NOTE — Progress Notes (Signed)
03/22/2021 2:45 PM   MAJESTA LEICHTER Mar 17, 1961 [id number]  Referring provider: Franciso Bend, MD [address] [place],  [place] [address]  Chief Complaint  Patient presents with   Nephrolithiasis    HPI: [name] is a 60 y.o. female referred for nephrolithiasis.  Followed by Dr. Smith Mince for renovascular hypertension and chronic kidney disease She had a KUB on 12/29/2020 after an aortogram which showed a persistent right nephrogram felt consistent with obstruction Renal ultrasound performed [facility] the following day 12/30/2020 with a 5 mm nonobstructing left renal calculus and atrophy of left kidney A CT abdomen pelvis November 2021 did not show nephrolithiasis 1 month history of discomfort right side She does have nocturia x5 and occasional urge incontinence   PMH: Past Medical History:  Diagnosis Date   Anemia 2008   Anxiety    Aortic stenosis    Arthritis    "all my joints ache" (09/07/2014)   Asthma    Bipolar 1 disorder (HCC)    Bradycardia    Bruit    Carpal tunnel syndrome    Cervical cancer ([place]) 1985   CHF (congestive heart failure) ([place])    Chronic kidney disease (CKD), stage V (HCC)    COPD (chronic obstructive pulmonary disease) ([place]) 2000   Coronary artery disease    Daily headache    Depression 2000   Diverticulitis 2008   GERD (gastroesophageal reflux disease)    Glaucoma    Heart murmur    History of colon polyps 2009   HLD (hyperlipidemia) 2013   Hypertension 2013   Hypovitaminosis D    IBS (irritable bowel syndrome) 2008   Myocardial infarction [facility])    2015   PAD (peripheral artery disease) ([place])    Pancreatitis 10/2011   Pneumonia 07/2014   RLS (restless legs syndrome)    Schizophrenia (HCC)    Shortness of breath    Skin cancer    Small bowel obstruction ([place]) 2008    Surgical History: Past Surgical History:  Procedure Laterality Date   ABDOMINAL ANGIOGRAM N/A 09/07/2014   Procedure: ABDOMINAL ANGIOGRAM;  Surgeon:  Laverda Page, MD;  [facility] CATH LAB;  Service: Cardiovascular;  Laterality: N/A;   ABDOMINAL AORTOGRAM W/LOWER EXTREMITY N/A 12/28/2020   Procedure: ABDOMINAL AORTOGRAM W/LOWER EXTREMITY;  Surgeon: Cherre Robins, MD;  Location: [place] CV LAB;  Service: Cardiovascular;  Laterality: N/A;   APPENDECTOMY  1996   BIOPSY  08/06/2020   Procedure: BIOPSY;  Surgeon: Jackquline Denmark, MD;  Location: [facility] ENDOSCOPY;  Service: Endoscopy;;   COLON SURGERY  2008   6 inches of colon removed due to obstruction   COLONOSCOPY WITH PROPOFOL N/A 10/25/2016   Procedure: COLONOSCOPY WITH PROPOFOL;  Surgeon: Milus Banister, MD;  Location: [facility] ENDOSCOPY;  Service: Endoscopy;  Laterality: N/A;   COLONOSCOPY WITH PROPOFOL N/A 08/07/2020   Procedure: COLONOSCOPY WITH PROPOFOL;  Surgeon: Jackquline Denmark, MD;  Location: [facility] ENDOSCOPY;  Service: Endoscopy;  Laterality: N/A;   COLONOSCOPY WITH PROPOFOL N/A 08/27/2020   Procedure: COLONOSCOPY WITH PROPOFOL;  Surgeon: Lavena Bullion, DO;  Location: [facility] ENDOSCOPY;  Service: Gastroenterology;  Laterality: N/A;   CORONARY ANGIOPLASTY WITH STENT PLACEMENT  09/07/2014   "2"   ENTEROSCOPY N/A 08/27/2020   Procedure: ENTEROSCOPY;  Surgeon: Lavena Bullion, DO;  Location: [facility] ENDOSCOPY;  Service: Gastroenterology;  Laterality: N/A;  Push enteroscopy    ESOPHAGOGASTRODUODENOSCOPY (EGD) WITH PROPOFOL N/A 08/06/2020   Procedure: ESOPHAGOGASTRODUODENOSCOPY (EGD) WITH PROPOFOL;  Surgeon: Jackquline Denmark,  MD;  Location: [facility] ENDOS[facility];  Service: Endoscopy;  Laterality: N/A;   GIVENS CAPSULE STUDY N/A 08/24/2020   Procedure: GIVENS CAPSULE STUDY;  Surgeon: Lavena Bullion, DO;  Location: [facility] ENDOSCOPY;  Service: Gastroenterology;  Laterality: N/A;   HOT HEMOSTASIS N/A 08/07/2020   Procedure: HOT HEMOSTASIS (ARGON PLASMA COAGULATION/BICAP);  Surgeon: Jackquline Denmark, MD;  Location: [facility] ENDOSCOPY;  Service: Endoscopy;  Laterality: N/A;   HOT HEMOSTASIS N/A 08/27/2020   Procedure: HOT  HEMOSTASIS (ARGON PLASMA COAGULATION/BICAP);  Surgeon: Lavena Bullion, DO;  Location: [facility];  Service: Gastroenterology;  Laterality: N/A;   LEFT HEART CATH AND CORONARY ANGIOGRAPHY N/A 03/25/2018   Procedure: LEFT HEART CATH AND CORONARY ANGIOGRAPHY;  Surgeon: Nigel Mormon, MD;  Location: [place] CV LAB;  Service: Cardiovascular;  Laterality: N/A;   LEFT HEART CATHETERIZATION WITH CORONARY ANGIOGRAM N/A 09/07/2014   Procedure: LEFT HEART CATHETERIZATION WITH CORONARY ANGIOGRAM;  Surgeon: Laverda Page, MD;  Location: [facility] CATH LAB;  Service: Cardiovascular;  Laterality: N/A;   LOWER EXTREMITY ANGIOGRAPHY  10/29/2017   Procedure: Lower Extremity Angiography;  Surgeon: Adrian Prows, MD;  Location: [place] CV LAB;  Service: Cardiovascular;;   PERIPHERAL VASCULAR CATHETERIZATION N/A 11/01/2015   Procedure: Renal Angiography;  Surgeon: Adrian Prows, MD;  Location: [place] CV LAB;  Service: Cardiovascular;  Laterality: N/A;   PERIPHERAL VASCULAR CATHETERIZATION N/A 04/10/2016   Procedure: Renal Angiography;  Surgeon: Adrian Prows, MD;  Location: [place] CV LAB;  Service: Cardiovascular;  Laterality: N/A;   PERIPHERAL VASCULAR CATHETERIZATION  04/10/2016   Procedure: Peripheral Vascular Intervention;  Surgeon: Adrian Prows, MD;  Location: [place] CV LAB;  Service: Cardiovascular;;   PERIPHERAL VASCULAR INTERVENTION  10/29/2017   Procedure: PERIPHERAL VASCULAR INTERVENTION;  Surgeon: Adrian Prows, MD;  Location: [place] CV LAB;  Service: Cardiovascular;;   PERIPHERAL VASCULAR INTERVENTION Bilateral 12/28/2020   Procedure: PERIPHERAL VASCULAR INTERVENTION;  Surgeon: Cherre Robins, MD;  Location: [place] CV LAB;  Service: Cardiovascular;  Laterality: Bilateral;   POLYPECTOMY  08/07/2020   Procedure: POLYPECTOMY;  Surgeon: Jackquline Denmark, MD;  Location: [facility];  Service: Endoscopy;;   POLYPECTOMY  08/27/2020   Procedure: POLYPECTOMY;  Surgeon: Lavena Bullion, DO;   Location: [facility];  Service: Gastroenterology;;   RENAL ANGIOGRAPHY N/A 10/29/2017   Procedure: RENAL ANGIOGRAPHY;  Surgeon: Adrian Prows, MD;  Location: [place] CV LAB;  Service: Cardiovascular;  Laterality: N/A;   RENAL ANGIOGRAPHY N/A 05/10/2020   Procedure: RENAL ANGIOGRAPHY;  Surgeon: Nigel Mormon, MD;  Location: [place] CV LAB;  Service: Cardiovascular;  Laterality: N/A;   RIGHT OOPHORECTOMY Right 1996   TOTAL ABDOMINAL HYSTERECTOMY  1997    Home Medications:  Allergies as of 03/22/2021       Reactions   Doxycycline Anaphylaxis, Hives   Hydralazine Rash   Hydrocodone-acetaminophen Nausea And Vomiting   Iohexol Itching   Pt has itching nose after iv contrast injection        Medication List        Accurate as of March 22, 2021  2:45 PM. If you have any questions, ask your nurse or doctor.          acetaminophen 500 MG tablet Commonly known as: TYLENOL Take 1,000 mg by mouth every 8 (eight) hours as needed for mild pain or headache.   albuterol 108 (90 Base) MCG/ACT inhaler Commonly known as: VENTOLIN HFA Inhale 2 puffs into the lungs every 6 (six) hours as needed for wheezing or shortness  of breath (asthma).   ALPRAZolam 1 MG tablet Commonly known as: XANAX Take 0.5 tablets by mouth 2 (two) times daily as needed for anxiety.   amLODipine 10 MG tablet Commonly known as: NORVASC TAKE 1 TABLET BY MOUTH ONCE DAILY   aspirin 81 MG EC tablet Take 1 tablet (81 mg total) by mouth daily. Swallow whole.   atenolol 50 MG tablet Commonly known as: TENORMIN Take 1 tablet (50 mg total) by mouth daily.   atorvastatin 80 MG tablet Commonly known as: LIPITOR Take 80 mg by mouth daily.   azelastine 0.1 % nasal spray Commonly known as: ASTELIN Place 1 spray into both nostrils daily as needed for rhinitis or allergies.   clopidogrel 75 MG tablet Commonly known as: Plavix Take 1 tablet (75 mg total) by mouth daily.   ezetimibe 10 MG tablet Commonly  known as: ZETIA Take 1 tablet (10 mg total) by mouth daily.   isosorbide mononitrate 60 MG 24 hr tablet Commonly known as: IMDUR Take 1 tablet (60 mg total) by mouth daily.   linaclotide 72 MCG capsule Commonly known as: Linzess Take 1 capsule (72 mcg total) by mouth daily before breakfast.   megestrol 20 MG tablet Commonly known as: MEGACE Take 20 mg by mouth daily.   mometasone-formoterol 100-5 MCG/ACT Aero Commonly known as: DULERA Take 2 puffs first thing in am and then another 2 puffs about 12 hours later. What changed:  how much to take how to take this when to take this additional instructions   nitroGLYCERIN 0.4 MG SL tablet Commonly known as: NITROSTAT Place 1 tablet (0.4 mg total) under the tongue every 5 (five) minutes as needed for chest pain.   oxyCODONE-acetaminophen 5-325 MG tablet Commonly known as: PERCOCET/ROXICET Take 1 tablet by mouth every 6 (six) hours as needed for moderate pain.   pantoprazole 40 MG tablet Commonly known as: PROTONIX TAKE 1 TABLET BY MOUTH TWICE (2) DAILY What changed: See the new instructions.   promethazine 25 MG tablet Commonly known as: PHENERGAN Take 25 mg by mouth daily as needed for nausea or vomiting.   venlafaxine XR 150 MG 24 hr capsule Commonly known as: Effexor XR Take 1 capsule (150 mg total) by mouth daily with breakfast. What changed: how much to take        Allergies:  Allergies  Allergen Reactions   Doxycycline Anaphylaxis and Hives   Hydralazine Rash   Hydrocodone-Acetaminophen Nausea And Vomiting   Iohexol Itching    Pt has itching nose after iv contrast injection    Family History: Family History  Problem Relation Age of Onset   Other Mother        many bowel obstructions   Heart disease Mother    Colon polyps Mother    Kidney cancer Father    Bone cancer Father    Diabetes Father    Heart disease Father    Diabetes Daughter    Colon cancer Paternal Grandfather    Heart disease  Brother    Esophageal cancer Neg Hx     Social History:  reports that she has been smoking cigarettes. She has a 40.00 pack-year smoking history. She has never used smokeless tobacco. She reports current drug use. Frequency: 3.00 times per week. Drug: Marijuana. She reports that she does not drink alcohol.   Physical Exam: BP 104/62   Pulse (!) 8   Ht 5\' 1"  (1.549 m)   Wt 90 lb (40.8 kg)   BMI 17.01 kg/m  Constitutional:  Alert and oriented, No acute distress. HEENT: [place] AT, moist mucus membranes.  Trachea midline, no masses. Cardiovascular: No clubbing, cyanosis, or edema. Respiratory: Normal respiratory effort, no increased work of breathing. Skin: No rashes, bruises or suspicious lesions. Neurologic: Grossly intact, no focal deficits, moving all 4 extremities. Psychiatric: Normal mood and affect.   Pertinent Imaging: Images were personally reviewed and interpreted.  I am not convinced that this is a renal calculus   US RENAL  Narrative CLINICAL DATA:  D 46-year-old female with history of right hydroureter.  EXAM: RENAL / URINARY TRACT ULTRASOUND COMPLETE  COMPARISON:  11/23/2020, 12/29/2020  FINDINGS: Right Kidney:  Renal measurements: 11.0 x 5.1 x 5.2 cm = volume: 153 mL. Diffusely increased echogenicity. No mass or hydronephrosis visualized.  Left Kidney:  Renal measurements: 7.6 x 4.1 x 3.5 cm = volume: 57 mL. Diffusely increased echogenicity. Shadowing nonobstructive calculus in the midpole measuring approximately 5.2 mm. No hydronephrosis. No masses.  Bladder:  Appears normal for degree of bladder distention.  Other:  None.  IMPRESSION: 1. No evidence of hydronephrosis. 2. Nonobstructive left interpolar renal calculus measuring approximately 5 mm. 3. Similar appearing findings compatible medical renal disease with similar appearing diffuse atrophy of the left kidney.   Electronically Signed By: Ruthann Cancer MD On: 12/30/2020 09:04  No  results found for this or any previous visit.  No results found for this or any previous visit.  No results found for this or any previous visit.   Assessment & Plan:    1.  Nephrolithiasis Nonobstructing calculus identified on ultrasound however on my review I am not convinced this is a stone CT November 2021 showed no renal calculi KUB was ordered which she did not have time to get today and will have performed next week; if a definite stone is not seen would recommend repeating a noncontrast CT  2.  Abdominal pain No definite urologic cause of her pain notified.  We discussed nonobstructing stones are typically not an etiology of pain   Abbie Sons, MD  [place] [address], [place], [place] [address] [phone]

## 2021-03-23 ENCOUNTER — Encounter: Payer: Self-pay | Admitting: Urology

## 2021-03-23 LAB — URINALYSIS, COMPLETE
Bilirubin, UA: NEGATIVE
Glucose, UA: NEGATIVE
Leukocytes,UA: NEGATIVE
Nitrite, UA: NEGATIVE
Specific Gravity, UA: >1.03 — ABNORMAL HIGH (ref 1.005–1.030)
Urobilinogen, Ur: 1 mg/dL (ref 0.2–1.0)
pH, UA: 5.5 (ref 5.0–7.5)

## 2021-03-23 LAB — MICROSCOPIC EXAMINATION

## 2021-03-27 ENCOUNTER — Ambulatory Visit (AMBULATORY_SURGERY_CENTER): Payer: Medicare Other | Admitting: *Deleted

## 2021-03-27 ENCOUNTER — Other Ambulatory Visit: Payer: Self-pay

## 2021-03-27 VITALS — Ht 61.5 in | Wt 90.0 lb

## 2021-03-27 DIAGNOSIS — Z86010 Personal history of colonic polyps: Secondary | ICD-10-CM

## 2021-03-27 MED ORDER — SUPREP BOWEL PREP KIT 17.5-3.13-1.6 GM/177ML PO SOLN: 1.0000 | Freq: Once | ORAL | 0 refills | Status: AC

## 2021-03-27 NOTE — Progress Notes (Signed)
No egg or soy allergy known to patient  No issues with past sedation with any surgeries or procedures Patient denies ever being told they had issues or difficulty with intubation  No FH of Malignant Hyperthermia No diet pills per patient No home 02 use per patient  No blood thinners per patient  Pt denies issues with constipation - on Linzess - alternates with diarrha and constipation- last colon 2021 fair prep- 2 day prep for this colon per MD  No A fib or A flutter  EMMI video to pt or via [place] 19 guidelines implemented in [place] today with Pt and RN  Pt is fully vaccinated  for Covid   Due to the COVID-19 pandemic we are asking patients to follow certain guidelines.  Pt aware of COVID protocols and [facility] guidelines   Pt is on Plavix,  should take until 6-30 and then stop 7-1 per Dr Stanford Breed - Dr Ardis Hughs aware- see TE's in Epic

## 2021-04-04 ENCOUNTER — Encounter (HOSPITAL_COMMUNITY): Payer: Self-pay

## 2021-04-09 NOTE — Progress Notes (Signed)
[facility] [place]  ASSESSMENT / PLAN: 60 y.o. female status post SMA stenting for chronic mesenteric ischemia and re-do aorto-bi-iliac stenting for occlusive disease with history of terminal aortic stenting 12/28/20.  Recommend the following which can slow the progression of atherosclerosis and reduce the risk of major adverse cardiac / limb events:  Complete cessation from all tobacco products. Blood glucose control with goal A1c < 7%. Blood pressure control with goal blood pressure < 140/90 mmHg. Lipid reduction therapy with goal LDL-C <100 mg/dL (<70 if symptomatic from PAD).  Aspirin 81mg  PO QD.  Clopidogrel 75mg  PO QD. Atorvastatin 40-80mg  PO QD (or other "high intensity" statin therapy).  OK to stop Plavix if needed for intervention. Resume plavix after intervention. Follow up with [facility] PA in 6 months for surveillance.   CHIEF COMPLAINT: Abdominal pain  HISTORY OF PRESENT ILLNESS:  Previously: [name] is a 60 y.o. female referred to clinic for evaluation of possible chronic mesenteric ischemia.  She has had a thorough work-up by gastroenterologist for constant generalized abdominal pain.  The pain is exacerbated by eating.  She reports she has lost nearly 30 pounds over the past year.  She reports she is afraid to eat.  01/10/21: Patient returns to clinic concerned about pain and "bulging" from her left groin.  She reports she continues to have some abdominal pain but has been able to eat.  04/11/21: still having abdominal pain. Planned to undergo endoscopy in near future. Having restless legs. No change in non-invasive studies.  VASCULAR SURGICAL HISTORY:  Bilateral renal artery stenting by Dr. Einar Gip intervention 05/10/2020 Distal aortic stenting by Dr. Einar Gip with 10 x 29 Omnilink postdilated to 12 mm 10/29/2017 SMA stenting (6x22mm HercuLink); Terminal aortic stenting (8x82mm VBX post dilated to 55mm; Bilateral common iliac artery stenting  (7x41mm VBX x 2) by me 12/28/20.  VASCULAR RISK FACTORS: Patient reports: -No history of cerebrovascular disease / stroke / transient ischemic attack. - No history of coronary artery disease. Coronary cath June 2019 unremarkable. - No history of diabetes mellitus (No results found for: HGBA1C). - Lifelong history of smoking. Actively smoking. - + history of hypertension.  - + history of chronic kidney disease (GFR 28). - + history of chronic obstructive pulmonary disease - HF with preserved ejection fraction (EF 60% January 2021).   Past Medical History:  Diagnosis Date   Anemia 2008   Anxiety    Aortic stenosis    Arthritis    "all my joints ache" (09/07/2014)   Asthma    Bipolar 1 disorder ([place])    Blood transfusion without reported diagnosis    Bradycardia    Bruit    Carpal tunnel syndrome    Cataract    forming   Cervical cancer ([facility]) 1985   CHF (congestive heart failure) ([place])    Chronic kidney disease (CKD), stage V (HCC)    COPD (chronic obstructive pulmonary disease) ([place]) 2000   Coronary artery disease    Daily headache    Depression 2000   Diverticulitis 2008   Emphysema of lung ([place])    GERD (gastroesophageal reflux disease)    Glaucoma    Heart murmur    History of colon polyps 2009   HLD (hyperlipidemia) 2013   Hypertension 2013   Hypovitaminosis D    IBS (irritable bowel syndrome) 2008   Myocardial infarction [facility])    2015   PAD (peripheral artery disease) ([place])    Pancreatitis 10/2011   Pneumonia 07/2014  RLS (restless legs syndrome)    Schizophrenia (HCC)    Shortness of breath    Skin cancer    Small bowel obstruction ([place]) 2008   Thyroid disease     Past Surgical History:  Procedure Laterality Date   ABDOMINAL ANGIOGRAM N/A 09/07/2014   Procedure: ABDOMINAL ANGIOGRAM;  Surgeon: Laverda Page, MD;  Location: [facility] CATH LAB;  Service: Cardiovascular;  Laterality: N/A;   ABDOMINAL AORTOGRAM W/LOWER EXTREMITY N/A 12/28/2020   Procedure:  ABDOMINAL AORTOGRAM W/LOWER EXTREMITY;  Surgeon: Cherre Robins, MD;  Location: [place] CV LAB;  Service: Cardiovascular;  Laterality: N/A;   APPENDECTOMY  10/01/1994   BIOPSY  08/06/2020   Procedure: BIOPSY;  Surgeon: Jackquline Denmark, MD;  Location: [facility] ENDOSCOPY;  Service: Endoscopy;;   COLON SURGERY  10/01/2006   6 inches of colon removed due to obstruction   COLONOSCOPY WITH PROPOFOL N/A 10/25/2016   Procedure: COLONOSCOPY WITH PROPOFOL;  Surgeon: Milus Banister, MD;  Location: [facility] ENDOSCOPY;  Service: Endoscopy;  Laterality: N/A;   COLONOSCOPY WITH PROPOFOL N/A 08/07/2020   Procedure: COLONOSCOPY WITH PROPOFOL;  Surgeon: Jackquline Denmark, MD;  Location: [facility] ENDOSCOPY;  Service: Endoscopy;  Laterality: N/A;   COLONOSCOPY WITH PROPOFOL N/A 08/27/2020   Procedure: COLONOSCOPY WITH PROPOFOL;  Surgeon: Lavena Bullion, DO;  Location: [facility] ENDOSCOPY;  Service: Gastroenterology;  Laterality: N/A;   CORONARY ANGIOPLASTY WITH STENT PLACEMENT  09/07/2014   "2"   ENTEROSCOPY N/A 08/27/2020   Procedure: ENTEROSCOPY;  Surgeon: Lavena Bullion, DO;  Location: [facility] ENDOSCOPY;  Service: Gastroenterology;  Laterality: N/A;  Push enteroscopy    ESOPHAGOGASTRODUODENOSCOPY (EGD) WITH PROPOFOL N/A 08/06/2020   Procedure: ESOPHAGOGASTRODUODENOSCOPY (EGD) WITH PROPOFOL;  Surgeon: Jackquline Denmark, MD;  Location: [facility] ENDOSCOPY;  Service: Endoscopy;  Laterality: N/A;   GIVENS CAPSULE STUDY N/A 08/24/2020   Procedure: GIVENS CAPSULE STUDY;  Surgeon: Lavena Bullion, DO;  Location: [facility] ENDOSCOPY;  Service: Gastroenterology;  Laterality: N/A;   HOT HEMOSTASIS N/A 08/07/2020   Procedure: HOT HEMOSTASIS (ARGON PLASMA COAGULATION/BICAP);  Surgeon: Jackquline Denmark, MD;  Location: [facility] ENDOSCOPY;  Service: Endoscopy;  Laterality: N/A;   HOT HEMOSTASIS N/A 08/27/2020   Procedure: HOT HEMOSTASIS (ARGON PLASMA COAGULATION/BICAP);  Surgeon: Lavena Bullion, DO;  Location: [facility] ENDOS[facility];  Service: Gastroenterology;  Laterality:  N/A;   LEFT HEART CATH AND CORONARY ANGIOGRAPHY N/A 03/25/2018   Procedure: LEFT HEART CATH AND CORONARY ANGIOGRAPHY;  Surgeon: Nigel Mormon, MD;  Location: [place] CV LAB;  Service: Cardiovascular;  Laterality: N/A;   LEFT HEART CATHETERIZATION WITH CORONARY ANGIOGRAM N/A 09/07/2014   Procedure: LEFT HEART CATHETERIZATION WITH CORONARY ANGIOGRAM;  Surgeon: Laverda Page, MD;  Location[facility] C[facility] LAB;  Service: Cardiovascular;  Laterality: N/A;   LOWER EXTREMITY ANGIOGRAPHY  10/29/2017   Procedure: Lower Extremity Angiography;  Surgeon: Adrian Prows, MD;  Location: [place] CV LAB;  Service: Cardiovascular;;   PERIPHERAL VASCULAR CATHETERIZATION N/A 11/01/2015   Procedure: Renal Angiography;  Surgeon: Adrian Prows, MD;  Location: [place] CV LAB;  Service: Cardiovascular;  Laterality: N/A;   PERIPHERAL VASCULAR CATHETERIZATION N/A 04/10/2016   Procedure: Renal Angiography;  Surgeon: Adrian Prows, MD;  Location: [place] CV LAB;  Service: Cardiovascular;  Laterality: N/A;   PERIPHERAL VASCULAR CATHETERIZATION  04/10/2016   Procedure: Peripheral Vascular Intervention;  Surgeon: Adrian Prows, MD;  Location: [place] CV LAB;  Service: Cardiovascular;;   PERIPHERAL VASCULAR INTERVENTION  10/29/2017   Procedure: PERIPHERAL VASCULAR INTERVENTION;  Surgeon: Adrian Prows, MD;  Location: [place]  CV LAB;  Service: Cardiovascular;;   PERIPHERAL VASCULAR INTERVENTION Bilateral 12/28/2020   Procedure: PERIPHERAL VASCULAR INTERVENTION;  Surgeon: Cherre Robins, MD;  Location: [place] CV LAB;  Service: Cardiovascular;  Laterality: Bilateral;   POLYPECTOMY  08/07/2020   Procedure: POLYPECTOMY;  Surgeon: Jackquline Denmark, MD;  Location: [facility] ENDOSCOPY;  Service: Endoscopy;;   POLYPECTOMY  08/27/2020   Procedure: POLYPECTOMY;  Surgeon: Lavena Bullion, DO;  Location: [facility] ENDOSCOPY;  Service: Gastroenterology;;   POLYPECTOMY     RENAL ANGIOGRAPHY N/A 10/29/2017   Procedure: RENAL ANGIOGRAPHY;   Surgeon: Adrian Prows, MD;  Location: [place] CV LAB;  Service: Cardiovascular;  Laterality: N/A;   RENAL ANGIOGRAPHY N/A 05/10/2020   Procedure: RENAL ANGIOGRAPHY;  Surgeon: Nigel Mormon, MD;  Location: [place] CV LAB;  Service: Cardiovascular;  Laterality: N/A;   RIGHT OOPHORECTOMY Right 10/01/1994   TOTAL ABDOMINAL HYSTERECTOMY  10/02/1995   UPPER GASTROINTESTINAL ENDOSCOPY      Family History  Problem Relation Age of Onset   Other Mother        many bowel obstructions   Heart disease Mother    Colon polyps Mother    Kidney cancer Father    Bone cancer Father    Diabetes Father    Heart disease Father    Heart disease Brother    Colon cancer Paternal Grandfather    Diabetes Daughter    Esophageal cancer Neg Hx    Rectal cancer Neg Hx    Stomach cancer Neg Hx     Social History   Socioeconomic History   Marital status: Divorced    Spouse name: Not on file   Number of children: 1   Years of education: Not on file   Highest education level: Not on file  Occupational History   Occupation: Scientist, research (life sciences): UNEMPLOYED  Tobacco Use   Smoking status: Some Days    Packs/day: 1.50    Years: 40.00    Pack years: 60.00    Types: Cigarettes   Smokeless tobacco: Never  Vaping Use   Vaping Use: Former  Substance and Sexual Activity   Alcohol use: No   Drug use: Yes    Frequency: 3.0 times per week    Types: Marijuana    Comment: last use yesterday   Sexual activity: Yes    Birth control/protection: Post-menopausal  Other Topics Concern   Not on file  Social History Narrative   Not on file   Social Determinants of Health   Financial Resource Strain: Not on file  Food Insecurity: Not on file  Transportation Needs: Not on file  Physical Activity: Not on file  Stress: Not on file  Social Connections: Not on file  Intimate Partner Violence: Not on file    Allergies  Allergen Reactions   Doxycycline Anaphylaxis and Hives    Hydralazine Rash   Hydrocodone-Acetaminophen Nausea And Vomiting   Iohexol Itching    Pt has itching nose after iv contrast injection    Current Outpatient Medications  Medication Sig Dispense Refill   acetaminophen (TYLENOL) 500 MG tablet Take 1,000 mg by mouth every 8 (eight) hours as needed for mild pain or headache.     albuterol (PROVENTIL HFA;VENTOLIN HFA) 108 (90 BASE) MCG/ACT inhaler Inhale 2 puffs into the lungs every 6 (six) hours as needed for wheezing or shortness of breath (asthma).     ALPRAZolam (XANAX) 1 MG tablet Take 0.5 tablets by mouth 2 (two) times daily as needed  for anxiety.     amLODipine (NORVASC) 10 MG tablet TAKE 1 TABLET BY MOUTH ONCE DAILY (Patient taking differently: Take 10 mg by mouth daily.) 30 tablet 3   aspirin EC 81 MG EC tablet Take 1 tablet (81 mg total) by mouth daily. Swallow whole. 30 tablet 11   atenolol (TENORMIN) 50 MG tablet Take 1 tablet (50 mg total) by mouth daily. 90 tablet 1   atorvastatin (LIPITOR) 80 MG tablet Take 80 mg by mouth daily.      azelastine (ASTELIN) 0.1 % nasal spray Place 1 spray into both nostrils daily as needed for rhinitis or allergies.      clopidogrel (PLAVIX) 75 MG tablet Take 1 tablet (75 mg total) by mouth daily. (Patient taking differently: Take 75 mg by mouth daily. To STOP 7-1 FRI = last dose 6-30)     ezetimibe (ZETIA) 10 MG tablet Take 1 tablet (10 mg total) by mouth daily. 90 tablet 3   isosorbide mononitrate (IMDUR) 60 MG 24 hr tablet Take 1 tablet (60 mg total) by mouth daily. (Patient taking differently: Take 30 mg by mouth daily.) 90 tablet 3   linaclotide (LINZESS) 72 MCG capsule Take 1 capsule (72 mcg total) by mouth daily before breakfast. 30 capsule 2   megestrol (MEGACE) 20 MG tablet Take 20 mg by mouth daily.     megestrol (MEGACE) 40 MG tablet Take 1 tablet by mouth daily. (Patient not taking: Reported on 03/27/2021)     mometasone-formoterol (DULERA) 100-5 MCG/ACT AERO Take 2 puffs first thing in am and  then another 2 puffs about 12 hours later. (Patient taking differently: Inhale 2 puffs into the lungs in the morning and at bedtime.) 1 Inhaler 11   nitroGLYCERIN (NITROSTAT) 0.4 MG SL tablet Place 1 tablet (0.4 mg total) under the tongue every 5 (five) minutes as needed for chest pain. 30 tablet 1   oxyCODONE-acetaminophen (PERCOCET/ROXICET) 5-325 MG tablet Take 1 tablet by mouth every 6 (six) hours as needed for moderate pain. 10 tablet 0   pantoprazole (PROTONIX) 40 MG tablet TAKE 1 TABLET BY MOUTH TWICE (2) DAILY (Patient taking differently: Take 40 mg by mouth 2 (two) times daily.) 180 tablet 2   promethazine (PHENERGAN) 25 MG tablet Take 25 mg by mouth daily as needed for nausea or vomiting.     venlafaxine XR (EFFEXOR XR) 150 MG 24 hr capsule Take 1 capsule (150 mg total) by mouth daily with breakfast. (Patient taking differently: Take 300 mg by mouth daily with breakfast.) 30 capsule 0   No current facility-administered medications for this visit.    REVIEW OF SYSTEMS:  [X]  denotes positive finding, [ ]  denotes negative finding Cardiac  Comments:  Chest pain or chest pressure:    Shortness of breath upon exertion:    Short of breath when lying flat:    Irregular heart rhythm:        Vascular    Pain in calf, thigh, or hip brought on by ambulation:    Pain in feet at night that wakes you up from your sleep:     Blood clot in your veins:    Leg swelling:         Pulmonary    Oxygen at home:    Productive cough:     Wheezing:         Neurologic    Sudden weakness in arms or legs:     Sudden numbness in arms or legs:     Sudden  onset of difficulty speaking or slurred speech:    Temporary loss of vision in one eye:     Problems with dizziness:         Gastrointestinal    Blood in stool:     Vomited blood:         Genitourinary    Burning when urinating:     Blood in urine:        Psychiatric    Major depression:         Hematologic    Bleeding problems:    Problems  with blood clotting too easily:        Skin    Rashes or ulcers:        Constitutional    Fever or chills:      PHYSICAL EXAM  Vitals:   04/11/21 1001  BP: (!) 166/82  Pulse: (!) 54  Resp: 14  Temp: 97.6 F (36.4 C)  TempSrc: Temporal  SpO2: 95%  Weight: 89 lb 3.2 oz (40.5 kg)  Height: 5\' 1"  (1.549 m)   Constitutional: Chronically ill appearing. No distress. Appears under nourished.  Neurologic: CN intact. no focal findings. no sensory loss. Psychiatric: Mood and affect symmetric and appropriate. Eyes: No icterus. No conjunctival pallor. Ears, nose, throat: mucous membranes moist. Midline trachea.  Cardiac: regular rate and rhythm.  Respiratory: unlabored. Abdominal: soft, non-tender, non-distended.  Peripheral vascular:  2+ L radial and brachial pulse  2+ femoral pulses  trace DP pulses  Extremity: No edema. No cyanosis. No pallor.  Skin: No gangrene. No ulceration.  Lymphatic: No Stemmer's sign. No palpable lymphadenopathy.  PERTINENT LABORATORY AND RADIOLOGIC DATA  Most recent CBC CBC Latest Ref Rng & Units 02/09/2021 12/28/2020 12/28/2020  WBC 4.0 - 10.5 K/uL 9.3 9.3 -  Hemoglobin 12.0 - 15.0 g/dL 11.2(L) 12.9 11.9(L)  Hematocrit 36.0 - 46.0 % 32.9(L) 39.1 35.0(L)  Platelets 150.0 - 400.0 K/uL 446.0(H) 384 -     Most recent CMP CMP Latest Ref Rng & Units 12/30/2020 12/29/2020 12/28/2020  Glucose 70 - 99 mg/dL 92 134(H) -  BUN 6 - 20 mg/dL 30(H) 24(H) -  Creatinine 0.44 - 1.00 mg/dL 2.63(H) 2.84(H) 2.67(H)  Sodium 135 - 145 mmol/L 136 135 -  Potassium 3.5 - 5.1 mmol/L 4.4 5.5(H) -  Chloride 98 - 111 mmol/L 111 105 -  CO2 22 - 32 mmol/L 22 23 -  Calcium 8.9 - 10.3 mg/dL 7.8(L) 9.1 -  Total Protein 6.5 - 8.1 g/dL - - -  Total Bilirubin 0.3 - 1.2 mg/dL - - -  Alkaline Phos 38 - 126 U/L - - -  AST 15 - 41 U/L - - -  ALT 0 - 44 U/L - - -    LDL Chol Calc (NIH)  Date Value Ref Range Status  03/31/2020 51 0 - 99 mg/dL Final    Mesenteric Duplex 01/16/21 SMA  stent not well visualized. Good flow in distal SMA.  Mesenteric Duplex 04/11/21 SMA stent not well visualized. Good flow in distal SMA.  ABI 01/16/21 Normalized ankle indices after stenting  ABI 04/11/21 Normal ankle indices  Cheyenne Aline. Stanford Breed, MD [facility] Phone Number: [phone] 04/09/2021 2:29 PM

## 2021-04-10 ENCOUNTER — Ambulatory Visit
Admission: RE | Admit: 2021-04-10 | Discharge: 2021-04-10 | Disposition: A | Discharge status: Home / Self Care | Payer: Medicare Other | Attending: Urology | Admitting: Urology

## 2021-04-10 ENCOUNTER — Ambulatory Visit
Admission: RE | Admit: 2021-04-10 | Discharge: 2021-04-10 | Disposition: A | Discharge status: Home / Self Care | Payer: Medicare Other | Source: Ambulatory Visit | Attending: Urology | Admitting: Urology

## 2021-04-10 DIAGNOSIS — N200 Calculus of kidney: Secondary | ICD-10-CM | POA: Insufficient documentation

## 2021-04-11 ENCOUNTER — Encounter: Payer: Self-pay | Admitting: Vascular Surgery

## 2021-04-11 ENCOUNTER — Other Ambulatory Visit: Payer: Self-pay

## 2021-04-11 ENCOUNTER — Ambulatory Visit (INDEPENDENT_AMBULATORY_CARE_PROVIDER_SITE_OTHER): Payer: Medicare Other | Admitting: Vascular Surgery

## 2021-04-11 ENCOUNTER — Ambulatory Visit (INDEPENDENT_AMBULATORY_CARE_PROVIDER_SITE_OTHER)
Admission: RE | Admit: 2021-04-11 | Discharge: 2021-04-11 | Disposition: A | Discharge status: Home / Self Care | Payer: Medicare Other | Source: Ambulatory Visit | Attending: Vascular Surgery | Admitting: Vascular Surgery

## 2021-04-11 ENCOUNTER — Ambulatory Visit (HOSPITAL_COMMUNITY)
Admission: RE | Admit: 2021-04-11 | Discharge: 2021-04-11 | Disposition: A | Discharge status: Home / Self Care | Payer: Medicare Other | Source: Ambulatory Visit | Attending: Vascular Surgery | Admitting: Vascular Surgery

## 2021-04-11 VITALS — BP 166/82 | HR 54 | Temp 97.6°F | Resp 14 | Ht 61.0 in | Wt 89.2 lb

## 2021-04-11 DIAGNOSIS — Q251 Coarctation of aorta: Secondary | ICD-10-CM | POA: Insufficient documentation

## 2021-04-11 DIAGNOSIS — I739 Peripheral vascular disease, unspecified: Secondary | ICD-10-CM

## 2021-04-11 DIAGNOSIS — R1084 Generalized abdominal pain: Secondary | ICD-10-CM | POA: Diagnosis present

## 2021-04-11 DIAGNOSIS — K551 Chronic vascular disorders of intestine: Secondary | ICD-10-CM

## 2021-04-12 ENCOUNTER — Ambulatory Visit (AMBULATORY_SURGERY_CENTER): Payer: Medicare Other | Admitting: Gastroenterology

## 2021-04-12 ENCOUNTER — Encounter: Payer: Self-pay | Admitting: Gastroenterology

## 2021-04-12 ENCOUNTER — Other Ambulatory Visit: Payer: Self-pay

## 2021-04-12 VITALS — BP 161/78 | HR 55 | Temp 98.4°F | Resp 16 | Ht 61.5 in | Wt 90.0 lb

## 2021-04-12 DIAGNOSIS — I739 Peripheral vascular disease, unspecified: Secondary | ICD-10-CM

## 2021-04-12 DIAGNOSIS — K635 Polyp of colon: Secondary | ICD-10-CM

## 2021-04-12 DIAGNOSIS — D125 Benign neoplasm of sigmoid colon: Secondary | ICD-10-CM

## 2021-04-12 DIAGNOSIS — K551 Chronic vascular disorders of intestine: Secondary | ICD-10-CM

## 2021-04-12 DIAGNOSIS — Z86010 Personal history of colonic polyps: Secondary | ICD-10-CM

## 2021-04-12 MED ORDER — SODIUM CHLORIDE 0.9 % IV SOLN: 500.0000 mL | Freq: Once | INTRAVENOUS | Status: DC

## 2021-04-12 NOTE — Progress Notes (Signed)
Called to room to assist during endoscopic procedure.  Patient ID and intended procedure confirmed with present staff. Received instructions for my participation in the procedure from the performing physician.  

## 2021-04-12 NOTE — Op Note (Signed)
[facility] Patient Name: [name] Procedure Date: 04/12/2021 3:02 PM MRN: [id number] Endoscopist: Milus Banister , MD Age: 60 Referring MD:  Date of Birth: 1960/11/07 Gender: Female Account #: [id number] Procedure:                Colonoscopy Indications:              High risk colon cancer surveillance: Personal                            history of colonic polyps; Colonoscopy 08/2020 2cm                            SSP piecemeal resected from ascending segment;                            chronic abd pains, severe vasculopath Medicines:                Monitored Anesthesia Care Procedure:                Pre-Anesthesia Assessment:                           - Prior to the procedure, a History and Physical                            was performed, and patient medications and                            allergies were reviewed. [name] tolerance of                            previous anesthesia was also reviewed. The risks                            and benefits of the procedure and the sedation                            options and risks were discussed with the patient.                            All questions were answered, and informed consent                            was obtained. Prior Anticoagulants: The patient has                            taken Plavix (clopidogrel), last dose was 5 days                            prior to procedure. ASA Grade Assessment: III - A                            patient with severe systemic disease. After  reviewing the risks and benefits, the patient was                            deemed in satisfactory condition to undergo the                            procedure.                           After obtaining informed consent, the colonoscope                            was passed under direct vision. Throughout the                            procedure, the patient's blood pressure, pulse, and                             oxygen saturations were monitored continuously. The                            Olympus [id number]-[id number] (#[id number]) Colonoscope was                            introduced through the anus and advanced to the the                            cecum, identified by appendiceal orifice and                            ileocecal valve. The colonoscopy was performed                            without difficulty. The patient tolerated the                            procedure well. The quality of the bowel                            preparation was good. The ileocecal valve,                            appendiceal orifice, and rectum were photographed. Scope In: 3:12:45 PM Scope Out: 3:33:33 PM Scope Withdrawal Time: 0 hours 8 minutes 4 seconds  Total Procedure Duration: 0 hours 20 minutes 48 seconds  Findings:                 A 8 mm polyp was found in the sigmoid colon. The                            polyp was sessile. The polyp was removed with a                            cold snare. Resection and retrieval were complete.  Multiple small and large-mouthed diverticula were                            found in the left colon.                           The exam was otherwise without abnormality on                            direct and retroflexion views. Complications:            No immediate complications. Estimated blood loss:                            None. Estimated Blood Loss:     Estimated blood loss: none. Impression:               - One 8 mm polyp in the sigmoid colon, removed with                            a cold snare. Resected and retrieved.                           - Diverticulosis in the left colon.                           - The examination was otherwise normal on direct                            and retroflexion views. Recommendation:           - Patient has a contact number available for                            emergencies. The signs and symptoms  of potential                            delayed complications were discussed with the                            patient. Return to normal activities tomorrow.                            Written discharge instructions were provided to the                            patient.                           - Resume previous diet.                           - Continue present medications. It is safe to                            resume your blood thinner today.                           -  Await pathology results.                           - Stop smoking. Milus Banister, MD 04/12/2021 3:37:30 PM This report has been signed electronically.

## 2021-04-12 NOTE — Progress Notes (Signed)
To PACU, VSS. Report to [email or web address] 

## 2021-04-12 NOTE — Patient Instructions (Addendum)
HANDOUTS PROVIDED ON: Polyps and Diverticulosis  The polyps removed today have been sent for pathology.  The results can take 1-3 weeks to receive.  When your next colonoscopy should occur will be based on the pathology results.    You may resume your previous diet and medication schedule.  Thank you for allowing us to care for you today!!!   YOU HAD AN ENDOSCOPIC PROCEDURE TODAY AT THE [facility]:   Refer to the procedure report that was given to you for any specific questions about what was found during the examination.  If the procedure report does not answer your questions, please call your gastroenterologist to clarify.  If you requested that your care partner not be given the details of your procedure findings, then the procedure report has been included in a sealed envelope for you to review at your convenience later.  YOU SHOULD EXPECT: Some feelings of bloating in the abdomen. Passage of more gas than usual.  Walking can help get rid of the air that was put into your GI tract during the procedure and reduce the bloating. If you had a lower endoscopy (such as a colonoscopy or flexible sigmoidoscopy) you may notice spotting of blood in your stool or on the toilet paper. If you underwent a bowel prep for your procedure, you may not have a normal bowel movement for a few days.  Please Note:  You might notice some irritation and congestion in your nose or some drainage.  This is from the oxygen used during your procedure.  There is no need for concern and it should clear up in a day or so.  SYMPTOMS TO REPORT IMMEDIATELY:  Following lower endoscopy (colonoscopy or flexible sigmoidoscopy):  Excessive amounts of blood in the stool  Significant tenderness or worsening of abdominal pains  Swelling of the abdomen that is new, acute  Fever of 100F or higher  For urgent or emergent issues, a gastroenterologist can be reached at any hour by calling [phone]. Do not use MyChart  messaging for urgent concerns.    DIET:  We do recommend a small meal at first, but then you may proceed to your regular diet.  Drink plenty of fluids but you should avoid alcoholic beverages for 24 hours.  ACTIVITY:  You should plan to take it easy for the rest of today and you should NOT DRIVE or use heavy machinery until tomorrow (because of the sedation medicines used during the test).    FOLLOW UP: Our staff will call the number listed on your records 48-72 hours following your procedure to check on you and address any questions or concerns that you may have regarding the information given to you following your procedure. If we do not reach you, we will leave a message.  We will attempt to reach you two times.  During this call, we will ask if you have developed any symptoms of COVID 19. If you develop any symptoms (ie: fever, flu-like symptoms, shortness of breath, cough etc.) before then, please call [phone].  If you test positive for Covid 19 in the 2 weeks post procedure, please call and report this information to us.    If any biopsies were taken you will be contacted by phone or by letter within the next 1-3 weeks.  Please call us at [phone] if you have not heard about the biopsies in 3 weeks.    SIGNATURES/CONFIDENTIALITY: You and/or your care partner have signed paperwork which will be entered   entered into your electronic medical record.  These signatures attest to the fact that that the information above on your After Visit Summary has been reviewed and is understood.  Full responsibility of the confidentiality of this discharge information lies with you and/or your care-partner.

## 2021-04-12 NOTE — Progress Notes (Signed)
[name]  Pt's states no medical or surgical changes since previsit or office visit.

## 2021-04-13 ENCOUNTER — Other Ambulatory Visit: Payer: Self-pay | Admitting: Cardiology

## 2021-04-13 DIAGNOSIS — I10 Essential (primary) hypertension: Secondary | ICD-10-CM

## 2021-04-14 ENCOUNTER — Encounter: Payer: Self-pay | Admitting: *Deleted

## 2021-04-14 ENCOUNTER — Telehealth: Payer: Self-pay | Admitting: *Deleted

## 2021-04-14 ENCOUNTER — Other Ambulatory Visit: Payer: Self-pay | Admitting: Urology

## 2021-04-14 DIAGNOSIS — R109 Unspecified abdominal pain: Secondary | ICD-10-CM

## 2021-04-14 NOTE — Telephone Encounter (Signed)
  Follow up Call-  Call back number 04/12/2021  Post procedure Call Back phone  # [phone]  Permission to leave phone message Yes  Some recent data might be hidden     Patient questions:  Do you have a fever, pain , or abdominal swelling? No. Pain Score  0 *  Have you tolerated food without any problems? Yes.    Have you been able to return to your normal activities? Yes.    Do you have any questions about your discharge instructions: Diet   No. Medications  No. Follow up visit  No.  Do you have questions or concerns about your Care? No.  Actions: * If pain score is 4 or above: No action needed, pain <4.Have you developed a fever since your procedure? no  2.   Have you had an respiratory symptoms (SOB or cough) since your procedure? no  3.   Have you tested positive for COVID 19 since your procedure no  4.   Have you had any family members/close contacts diagnosed with the COVID 19 since your procedure?  no   If yes to any of these questions please route to [name], RN and [name], RN

## 2021-04-14 NOTE — Telephone Encounter (Signed)
Left message on f/u call 

## 2021-04-18 ENCOUNTER — Encounter: Payer: Self-pay | Admitting: Gastroenterology

## 2021-04-27 ENCOUNTER — Telehealth: Payer: Self-pay

## 2021-04-27 NOTE — Telephone Encounter (Signed)
Low-dose CT scan of the chest for lung cancer screening on 10/20/2020: 1.  Multiple small bilateral benign-appearing pulmonary nodules.  2.  Moderate to severe coronary calcification.  3.  Severe atherosclerotic calcification in the visualized upper abdomen.  4. Patent central airways. No pleural effusion or pneumothorax. Mild upper lobe predominant centrilobular and paraseptal emphysema. There are calcified nodules from prior granulomatous infection.  5. Severe vascular calcifications of the visualized aorta, celiac artery, and bilateral renal arteries. Left kidney only partially visualized, but appears slightly small.

## 2021-05-01 ENCOUNTER — Other Ambulatory Visit: Payer: Self-pay | Admitting: Cardiology

## 2021-05-01 NOTE — Telephone Encounter (Signed)
Spoke to patient she is aware and appt has been scheduled

## 2021-05-02 ENCOUNTER — Encounter: Payer: Self-pay | Admitting: Student

## 2021-05-02 ENCOUNTER — Ambulatory Visit: Payer: Medicare Other | Admitting: Student

## 2021-05-02 ENCOUNTER — Other Ambulatory Visit: Payer: Self-pay

## 2021-05-02 VITALS — BP 150/80 | HR 54 | Temp 98.2°F | Ht 61.5 in | Wt 89.4 lb

## 2021-05-02 DIAGNOSIS — R0602 Shortness of breath: Secondary | ICD-10-CM

## 2021-05-02 DIAGNOSIS — R079 Chest pain, unspecified: Secondary | ICD-10-CM

## 2021-05-02 DIAGNOSIS — I10 Essential (primary) hypertension: Secondary | ICD-10-CM

## 2021-05-02 MED ORDER — LABETALOL HCL 100 MG PO TABS: 100.0000 mg | ORAL_TABLET | Freq: Two times a day (BID) | ORAL | 3 refills | Status: DC

## 2021-05-02 NOTE — Progress Notes (Signed)
Primary Physician/Referring:  Bernerd Limbo, MD  [name], female    DOB: Jul 01, 1961, 60 y.o.   MRN: [id number]  Chief Complaint  Patient presents with   Hypertension   PAD   Follow-up   doe   Chest Pain   HPI:    [name]  is a 60 y.o. Caucasian female  with hypertension, COPD, diastolic CHF, ongoing tobacco abuse (2 cig a day), and difficult to control hypertension with CKD, with bilateral renal artery angioplasty, PAD with distal abdominal aorta stenosis S/P stenting with 10x 29 mm Omnilink Elite stent. She underwent coronary angiogram in June 2019 for nitroglycerin responsive chest pain that revealed normal coronary arteries.  History of lower GI bleed found to have colonic polyposis 08/2020 requiring 2 units of packed RBCs.  Renal arteriogram 8/21 revealed occluded left renal artery status post partial successful balloon angioplasty.  Followed by vascular surgery as she underwent elective stenting of SMA, terminal aorta, bilateral common iliac arteries for chronic mesenteric ischemia on 11/2020.  Presents for follow-up.  She was last seen in the office 03/09/2021 and advised to restart Imdur 60 mg daily, however patient reported episodes of systolic blood pressure <175 mmHg associated with dizziness, she therefore independently reduced Imdur to 30 mg once daily.  Her blood pressure remains uncontrolled at today's office visit.  Patient's primary concern today is shortness of breath both at rest and with exertion lasting approximately 1 minute as well as occasional episodes of brief substernal chest discomfort.  Patient denies syncope, near syncope, dizziness.  She does continue to have intermittent minimal bilateral lower leg edema.  Patient is congratulated on her continued efforts to quit smoking, presently she is smoking half a pack per day, down from 2 packs/day.  Patient recently had chest CT done for lung cancer screening which noted coronary artery and  aortic calcifications, patient wishes to discuss this further.  Past Medical History:  Diagnosis Date   Anemia 2008   Anxiety    Aortic stenosis    Arthritis    "all my joints ache" (09/07/2014)   Asthma    Bipolar 1 disorder ([place])    Blood transfusion without reported diagnosis    Bradycardia    Bruit    Carpal tunnel syndrome    Cataract    forming   Cervical cancer ([place]) 1985   CHF (congestive heart failure) ([place])    Chronic kidney disease (CKD), stage V (HCC)    COPD (chronic obstructive pulmonary disease) ([place]) 2000   Coronary artery disease    Daily headache    Depression 2000   Diverticulitis 2008   Emphysema of lung (HCC)    GERD (gastroesophageal reflux disease)    Glaucoma    Heart murmur    History of colon polyps 2009   HLD (hyperlipidemia) 2013   Hypertension 2013   Hypovitaminosis D    IBS (irritable bowel syndrome) 2008   Myocardial infarction [facility])    2015   PAD (peripheral artery disease) ([place])    Pancreatitis 10/2011   Pneumonia 07/2014   RLS (restless legs syndrome)    Schizophrenia (HCC)    Shortness of breath    Skin cancer    Small bowel obstruction ([place]) 2008   Thyroid disease    Past Surgical History:  Procedure Laterality Date   ABDOMINAL ANGIOGRAM N/A 09/07/2014   Procedure: ABDOMINAL ANGIOGRAM;  Surgeon: Laverda Page, MD;  Location: [facility] CATH LAB;  Service: Cardiovascular;  Laterality: N/A;  ABDOMINAL AORTOGRAM W/LOWER EXTREMITY N/A 12/28/2020   Procedure: ABDOMINAL AORTOGRAM W/LOWER EXTREMITY;  Surgeon: Cherre Robins, MD;  Location: [place] CV LAB;  Service: Cardiovascular;  Laterality: N/A;   APPENDECTOMY  10/01/1994   BIOPSY  08/06/2020   Procedure: BIOPSY;  Surgeon: Jackquline Denmark, MD;  Location: [facility] ENDOSCOPY;  Service: Endoscopy;;   COLON SURGERY  10/01/2006   6 inches of colon removed due to obstruction   COLONOSCOPY WITH PROPOFOL N/A 10/25/2016   Procedure: COLONOSCOPY WITH PROPOFOL;  Surgeon: Milus Banister, MD;   Location: [facility] ENDOSCOPY;  Service: Endoscopy;  Laterality: N/A;   COLONOSCOPY WITH PROPOFOL N/A 08/07/2020   Procedure: COLONOSCOPY WITH PROPOFOL;  Surgeon: Jackquline Denmark, MD;  Location: [facility] ENDOSCOPY;  Service: Endoscopy;  Laterality: N/A;   COLONOSCOPY WITH PROPOFOL N/A 08/27/2020   Procedure: COLONOSCOPY WITH PROPOFOL;  Surgeon: Lavena Bullion, DO;  Location: [facility] ENDOSCOPY;  Service: Gastroenterology;  Laterality: N/A;   CORONARY ANGIOPLASTY WITH STENT PLACEMENT  09/07/2014   "2"   ENTEROSCOPY N/A 08/27/2020   Procedure: ENTEROSCOPY;  Surgeon: Lavena Bullion, DO;  Location: [facility] ENDOSCOPY;  Service: Gastroenterology;  Laterality: N/A;  Push enteroscopy    ESOPHAGOGASTRODUODENOSCOPY (EGD) WITH PROPOFOL N/A 08/06/2020   Procedure: ESOPHAGOGASTRODUODENOSCOPY (EGD) WITH PROPOFOL;  Surgeon: Jackquline Denmark, MD;  Location: [facility]OS[facility];  Service: Endoscopy;  Laterality: N/A;   GIVENS CAPSULE STUDY N/A 08/24/2020   Procedure: GIVENS CAPSULE STUDY;  Surgeon: Lavena Bullion, DO;  Location: [facility] ENDOS[facility];  Service: Gastroenterology;  Laterality: N/A;   HOT HEMOSTASIS N/A 08/07/2020   Procedure: HOT HEMOSTASIS (ARGON PLASMA COAGULATION/BICAP);  Surgeon: Jackquline Denmark, MD;  Location: [facility];  Service: Endoscopy;  Laterality: N/A;   HOT HEMOSTASIS N/A 08/27/2020   Procedure: HOT HEMOSTASIS (ARGON PLASMA COAGULATION/BICAP);  Surgeon: Lavena Bullion, DO;  [facility];  Service: Gastroenterology;  Laterality: N/A;   LEFT HEART CATH AND CORONARY ANGIOGRAPHY N/A 03/25/2018   Procedure: LEFT HEART CATH AND CORONARY ANGIOGRAPHY;  Surgeon: Nigel Mormon, MD;  Location: [place] CV LAB;  Service: Cardiovascular;  Laterality: N/A;   LEFT HEART CATHETERIZATION WITH CORONARY ANGIOGRAM N/A 09/07/2014   Procedure: LEFT HEART CATHETERIZATION WITH CORONARY ANGIOGRAM;  Surgeon: Laverda Page, MD;  Location[facility] CATH LAB;  Service: Cardiovascular;  Laterality: N/A;   LOWER EXTREMITY  ANGIOGRAPHY  10/29/2017   Procedure: Lower Extremity Angiography;  Surgeon: Adrian Prows, MD;  Location: [place] CV LAB;  Service: Cardiovascular;;   PERIPHERAL VASCULAR CATHETERIZATION N/A 11/01/2015   Procedure: Renal Angiography;  Surgeon: Adrian Prows, MD;  Location: [place] CV LAB;  Service: Cardiovascular;  Laterality: N/A;   PERIPHERAL VASCULAR CATHETERIZATION N/A 04/10/2016   Procedure: Renal Angiography;  Surgeon: Adrian Prows, MD;  Location: [place] CV LAB;  Service: Cardiovascular;  Laterality: N/A;   PERIPHERAL VASCULAR CATHETERIZATION  04/10/2016   Procedure: Peripheral Vascular Intervention;  Surgeon: Adrian Prows, MD;  Location: [place] CV LAB;  Service: Cardiovascular;;   PERIPHERAL VASCULAR INTERVENTION  10/29/2017   Procedure: PERIPHERAL VASCULAR INTERVENTION;  Surgeon: Adrian Prows, MD;  Location: [place] CV LAB;  Service: Cardiovascular;;   PERIPHERAL VASCULAR INTERVENTION Bilateral 12/28/2020   Procedure: PERIPHERAL VASCULAR INTERVENTION;  Surgeon: Cherre Robins, MD;  Location: [place] CV LAB;  Service: Cardiovascular;  Laterality: Bilateral;   POLYPECTOMY  08/07/2020   Procedure: POLYPECTOMY;  Surgeon: Jackquline Denmark, MD;  [facility];  Service: Endoscopy;;   POLYPECTOMY  08/27/2020   Procedure: POLYPECTOMY;  Surgeon: Lavena Bullion, DO;  Location: [facility];  Service: Gastroenterology;;   POLYPECTOMY     RENAL ANGIOGRAPHY N/A 10/29/2017   Procedure: RENAL ANGIOGRAPHY;  Surgeon: Adrian Prows, MD;  Location: [place] CV LAB;  Service: Cardiovascular;  Laterality: N/A;   RENAL ANGIOGRAPHY N/A 05/10/2020   Procedure: RENAL ANGIOGRAPHY;  Surgeon: Nigel Mormon, MD;  Location: [place] CV LAB;  Service: Cardiovascular;  Laterality: N/A;   RIGHT OOPHORECTOMY Right 10/01/1994   TOTAL ABDOMINAL HYSTERECTOMY  10/02/1995   UPPER GASTROINTESTINAL ENDOSCOPY     Family History  Problem Relation Age of Onset   Other Mother        many  bowel obstructions   Heart disease Mother    Colon polyps Mother    Kidney cancer Father    Bone cancer Father    Diabetes Father    Heart disease Father    Heart disease Brother    Colon cancer Paternal Grandfather    Diabetes Daughter    Esophageal cancer Neg Hx    Rectal cancer Neg Hx    Stomach cancer Neg Hx     Social History   Tobacco Use   Smoking status: Some Days    Packs/day: 1.50    Years: 40.00    Pack years: 60.00    Types: Cigarettes   Smokeless tobacco: Never  Substance Use Topics   Alcohol use: No   Marital Status: Divorced  ROS  Review of Systems  Constitutional: Negative for malaise/fatigue and weight gain.  Cardiovascular:  Positive for chest pain and dyspnea on exertion. Negative for claudication, leg swelling, near-syncope, orthopnea, palpitations, paroxysmal nocturnal dyspnea and syncope.  Respiratory:  Negative for cough and shortness of breath.   Hematologic/Lymphatic: Does not bruise/bleed easily.  Musculoskeletal:  Positive for muscle cramps (occasionial night cramps). Negative for joint swelling.  Gastrointestinal:  Positive for abdominal pain. Negative for heartburn and melena.  Neurological:  Negative for dizziness and weakness.  Psychiatric/Behavioral:  Positive for depression.   Objective  Blood pressure (!) 150/80, pulse (!) 54, temperature 98.2 F (36.8 C), temperature source Temporal, height 5' 1.5" (1.562 m), weight 89 lb 6.4 oz (40.6 kg), SpO2 97 %.  Vitals with BMI 05/02/2021 05/02/2021 04/12/2021  Height - 5' 1.5" -  Weight - 89 lbs 6 oz -  BMI - 73.41 -  Systolic 937 902 409  Diastolic 80 80 78  Pulse 54 54 55     Physical Exam Vitals reviewed.  Constitutional:      General: She is not in acute distress.    Appearance: She is underweight.  Cardiovascular:     Rate and Rhythm: Regular rhythm. Bradycardia present.     Pulses:          Carotid pulses are  on the right side with bruit and  on the left side with bruit.      Femoral  pulses are 2+ on the right side and 2+ on the left side.      Popliteal pulses are 2+ on the right side and 2+ on the left side.       Dorsalis pedis pulses are 0 on the right side and 2+ on the left side.       Posterior tibial pulses are 0 on the right side and 2+ on the left side.     Heart sounds: Normal heart sounds. No murmur heard.   No gallop.     Comments: No leg edema. No JVD.   Pulmonary:  Effort: Pulmonary effort is normal. No accessory muscle usage.     Breath sounds: Wheezing (bilateral throughout) and rhonchi present.  Chest:    Musculoskeletal:     Right lower leg: Edema (trace) present.     Left lower leg: Edema (trace) present.  Neurological:     Mental Status: She is alert.   Laboratory examination:   CMP Latest Ref Rng & Units 12/30/2020 12/29/2020 12/28/2020  Glucose 70 - 99 mg/dL 92 134(H) -  BUN 6 - 20 mg/dL 30(H) 24(H) -  Creatinine 0.44 - 1.00 mg/dL 2.63(H) 2.84(H) 2.67(H)  Sodium 135 - 145 mmol/L 136 135 -  Potassium 3.5 - 5.1 mmol/L 4.4 5.5(H) -  Chloride 98 - 111 mmol/L 111 105 -  CO2 22 - 32 mmol/L 22 23 -  Calcium 8.9 - 10.3 mg/dL 7.8(L) 9.1 -  Total Protein 6.5 - 8.1 g/dL - - -  Total Bilirubin 0.3 - 1.2 mg/dL - - -  Alkaline Phos 38 - 126 U/L - - -  AST 15 - 41 U/L - - -  ALT 0 - 44 U/L - - -   CBC Latest Ref Rng & Units 02/09/2021 12/28/2020 12/28/2020  WBC 4.0 - 10.5 K/uL 9.3 9.3 -  Hemoglobin 12.0 - 15.0 g/dL 11.2(L) 12.9 11.9(L)  Hematocrit 36.0 - 46.0 % 32.9(L) 39.1 35.0(L)  Platelets 150.0 - 400.0 K/uL 446.0(H) 384 -   Lipid Panel No results for input(s): CHOL, TRIG, LDLCALC, VLDL, HDL, CHOLHDL, LDLDIRECT in the last 8760 hours.   HEMOGLOBIN A1C No results found for: HGBA1C, MPG TSH No results for input(s): TSH in the last 8760 hours.   External labs:  03/01/2020: Hb 10.9/HCT 34.4, normal indicis, platelet counts 376. Serum glucose 100 mg, BUN 21, creatinine 2.59, EGFR 20 mL.  Sodium 140, potassium 5.9.  LFTs normal.  Allergies    Allergies  Allergen Reactions   Doxycycline Anaphylaxis and Hives   Hydralazine Rash   Hydrocodone-Acetaminophen Nausea And Vomiting   Iohexol Itching    Pt has itching nose after iv contrast injection    Medications Prior to Visit:   Outpatient Medications Prior to Visit  Medication Sig Dispense Refill   acetaminophen (TYLENOL) 500 MG tablet Take 1,000 mg by mouth every 8 (eight) hours as needed for mild pain or headache.     albuterol (PROVENTIL HFA;VENTOLIN HFA) 108 (90 BASE) MCG/ACT inhaler Inhale 2 puffs into the lungs every 6 (six) hours as needed for wheezing or shortness of breath (asthma).     ALPRAZolam (XANAX) 1 MG tablet Take 0.5 tablets by mouth 2 (two) times daily as needed for anxiety.     amLODipine (NORVASC) 10 MG tablet TAKE 1 TABLET BY MOUTH ONCE DAILY 90 tablet 1   aspirin EC 81 MG EC tablet Take 1 tablet (81 mg total) by mouth daily. Swallow whole. 30 tablet 11   atorvastatin (LIPITOR) 80 MG tablet Take 80 mg by mouth daily.      azelastine (ASTELIN) 0.1 % nasal spray Place 1 spray into both nostrils daily as needed for rhinitis or allergies.      clopidogrel (PLAVIX) 75 MG tablet Take 1 tablet (75 mg total) by mouth daily. (Patient taking differently: Take 75 mg by mouth daily. To STOP 7-1 FRI = last dose 6-30)     ezetimibe (ZETIA) 10 MG tablet Take 1 tablet (10 mg total) by mouth daily. 90 tablet 3   isosorbide mononitrate (IMDUR) 60 MG 24 hr tablet Take 1 tablet (60 mg  total) by mouth daily. (Patient taking differently: Take 30 mg by mouth daily.) 90 tablet 3   linaclotide (LINZESS) 72 MCG capsule Take 1 capsule (72 mcg total) by mouth daily before breakfast. 30 capsule 2   megestrol (MEGACE) 20 MG tablet Take 20 mg by mouth daily.     mometasone-formoterol (DULERA) 100-5 MCG/ACT AERO Take 2 puffs first thing in am and then another 2 puffs about 12 hours later. (Patient taking differently: Inhale 2 puffs into the lungs in the morning and at bedtime.) 1 Inhaler 11    nitroGLYCERIN (NITROSTAT) 0.4 MG SL tablet Place 1 tablet (0.4 mg total) under the tongue every 5 (five) minutes as needed for chest pain. 30 tablet 1   oxyCODONE-acetaminophen (PERCOCET/ROXICET) 5-325 MG tablet Take 1 tablet by mouth every 6 (six) hours as needed for moderate pain. 10 tablet 0   pantoprazole (PROTONIX) 40 MG tablet TAKE 1 TABLET BY MOUTH TWICE (2) DAILY (Patient taking differently: Take 40 mg by mouth 2 (two) times daily.) 180 tablet 2   promethazine (PHENERGAN) 25 MG tablet Take 25 mg by mouth daily as needed for nausea or vomiting.     promethazine (PHENERGAN) 25 MG tablet Take by mouth.     venlafaxine XR (EFFEXOR XR) 150 MG 24 hr capsule Take 1 capsule (150 mg total) by mouth daily with breakfast. (Patient taking differently: Take 300 mg by mouth daily with breakfast.) 30 capsule 0   atenolol (TENORMIN) 50 MG tablet TAKE 1 TABLET BY MOUTH ONCE DAILY 90 tablet 1   megestrol (MEGACE) 40 MG tablet Take 1 tablet by mouth daily.     No facility-administered medications prior to visit.   Final Medications at End of Visit    Current Meds  Medication Sig   acetaminophen (TYLENOL) 500 MG tablet Take 1,000 mg by mouth every 8 (eight) hours as needed for mild pain or headache.   albuterol (PROVENTIL HFA;VENTOLIN HFA) 108 (90 BASE) MCG/ACT inhaler Inhale 2 puffs into the lungs every 6 (six) hours as needed for wheezing or shortness of breath (asthma).   ALPRAZolam (XANAX) 1 MG tablet Take 0.5 tablets by mouth 2 (two) times daily as needed for anxiety.   amLODipine (NORVASC) 10 MG tablet TAKE 1 TABLET BY MOUTH ONCE DAILY   aspirin EC 81 MG EC tablet Take 1 tablet (81 mg total) by mouth daily. Swallow whole.   atorvastatin (LIPITOR) 80 MG tablet Take 80 mg by mouth daily.    azelastine (ASTELIN) 0.1 % nasal spray Place 1 spray into both nostrils daily as needed for rhinitis or allergies.    clopidogrel (PLAVIX) 75 MG tablet Take 1 tablet (75 mg total) by mouth daily. (Patient taking  differently: Take 75 mg by mouth daily. To STOP 7-1 FRI = last dose 6-30)   ezetimibe (ZETIA) 10 MG tablet Take 1 tablet (10 mg total) by mouth daily.   isosorbide mononitrate (IMDUR) 60 MG 24 hr tablet Take 1 tablet (60 mg total) by mouth daily. (Patient taking differently: Take 30 mg by mouth daily.)   labetalol (NORMODYNE) 100 MG tablet Take 1 tablet (100 mg total) by mouth 2 (two) times daily.   linaclotide (LINZESS) 72 MCG capsule Take 1 capsule (72 mcg total) by mouth daily before breakfast.   megestrol (MEGACE) 20 MG tablet Take 20 mg by mouth daily.   mometasone-formoterol (DULERA) 100-5 MCG/ACT AERO Take 2 puffs first thing in am and then another 2 puffs about 12 hours later. (Patient taking differently: Inhale 2  puffs into the lungs in the morning and at bedtime.)   nitroGLYCERIN (NITROSTAT) 0.4 MG SL tablet Place 1 tablet (0.4 mg total) under the tongue every 5 (five) minutes as needed for chest pain.   oxyCODONE-acetaminophen (PERCOCET/ROXICET) 5-325 MG tablet Take 1 tablet by mouth every 6 (six) hours as needed for moderate pain.   pantoprazole (PROTONIX) 40 MG tablet TAKE 1 TABLET BY MOUTH TWICE (2) DAILY (Patient taking differently: Take 40 mg by mouth 2 (two) times daily.)   promethazine (PHENERGAN) 25 MG tablet Take 25 mg by mouth daily as needed for nausea or vomiting.   promethazine (PHENERGAN) 25 MG tablet Take by mouth.   venlafaxine XR (EFFEXOR XR) 150 MG 24 hr capsule Take 1 capsule (150 mg total) by mouth daily with breakfast. (Patient taking differently: Take 300 mg by mouth daily with breakfast.)   [DISCONTINUED] atenolol (TENORMIN) 50 MG tablet TAKE 1 TABLET BY MOUTH ONCE DAILY   Radiology:   No results found. Low-dose CT scan of the chest for lung cancer screening on 10/20/2020: 1.  Multiple small bilateral benign-appearing pulmonary nodules. 2.  Moderate to severe coronary calcification. 3.  Severe atherosclerotic calcification in the visualized upper abdomen. 4.  Patent central airways. No pleural effusion or pneumothorax. Mild upper lobe predominant centrilobular and paraseptal emphysema. There are calcified nodules from prior granulomatous infection. 5. Severe vascular calcifications of the visualized aorta, celiac artery, and bilateral renal arteries. Left kidney only partially visualized, but appears slightly small.   Cardiac Studies:   Renal arteriogram 02/29/2018: Widely patient stents. 04/10/2016: Scoring balloon angioplasty left in-stent restenosis with 6 x 20 mm angiosculpt balloon.  Right renal artery stent widely patent. H/O bilateral renal stenting on 09/07/2014: Right renal artery 6.0 x 15 mm Herculink widely patent, left renal artery  6.0 x 18 mm Herculink stent.  Renal artery duplex 09/18/2017: Hemodynamically significant stenosis of the right renal artery. Peak Velocity of 308/66 cm/s. Normal intrarenal vascular perfusion is noted in both kidneys. There is increased echogenecity of both kidneys suggests medico-renal disease with right kidney slightly shrunk at 8.03x3.97x4 cm compared to 9.144.664.6 cm Diffuse plaque noted in the abdominal aorta with calcification.  Compared to the study done on 01/22/2017, left renal artery stenosis not evident, right renal artery stenosis new.  Patient has h/o bilateral renal artery stenting.  Abdominal aortogram 10/29/2017: Widely patent renal arteries. Distal abdominal aorta diffuse disease, calcification with 80% stenosis S/P 10 x 29 mm Omnilink Elite stent, 80% to 0%.    Lexiscan myoview stress test 02/17/2018:  1. Lexiscan stress test was performed. Exercise capacity was not assessed. Stress symptoms included dizziness, nausea, headache, and chest pressure.. Peak blood pressure was 176/88 mmHg. Stress EKG is non diagnostic for ischemia as it is a pharmacologic stress. In addition, it showed The stress electrocardiogram showed sinus tachycardia, normal stress conduction, left ventricular hypertrophy, no  stress arrhythmias and normal stress repolarization. 2. The overall quality of the study is excellent. Left ventricular cavity is noted to be normal on the rest and stress studies. Gated SPECT images reveal normal myocardial thickening and wall motion. The left ventricular ejection fraction was calculated or visually estimated to be 78%. SPECT images demonstrate small perfusion abnormality of moderate intensity in the mid anterolateral and apical lateral myocardial wall(s) on the stress images, reversible on rest images. Findings suggest small area of moderate intensity ischemia in mid to apical anterolateral myocardium. Intermediate risk study.  Cardiac Cath 03/25/2018: Normal coronary arteries.  Lower extremity arterial duplex  12/05/2018: No hemodynamically significant stenoses are identified in the lower extremity arterial system.  This exam reveals moderately decreased perfusion of the right lower extremity, noted at the dorsalis pedis and post tibial artery level (ABI 0.68) and mildly decreased perfusion of the left lower extremity, noted at the post tibial artery level (ABI 0.83). Triphasic waveform the the major vessels suggest diffuse small vessel disease. Left proximal SFA has < 50% stenosis.  208/06/2016, mild progression of PAD with decreased ABI bilaterally compared to left ABI 0.95 and right ABI 0.94.   Abdominal Aortic Duplex  05/27/2019: Moderate plaque noted in the proximal, mid and distal aorta.  No abdominal aortic aneurysm. Distal abdominal aortic velocity is mildly elevated suggestive of <50% stenosis. Patient has h/o distal abdominal aortic stenosis and stenting on 10/29/2017. The stent appears patent.   Echocardiogram 10/16/2019:  Normal LV systolic function with EF 60%. Left ventricle cavity is normal in size. Normal global wall motion. Normal diastolic filling pattern.  Calculated EF 60%.  Left atrial cavity is mildly dilated in 4 chamber views.  Structurally normal mitral  valve.  Mild (Grade I) mitral regurgitation.  Structurally normal tricuspid valve.  Mild tricuspid regurgitation. No evidence of pulmonary hypertension.  Compared to 02/25/2017, no significant change.   Renal angiogram 05/10/2020: Patent Rt renal stent (placed 2015,  6.0 x 15 mm Herculink) 100% restenosis of Lt renals stent with flush occlusion 6.0 x 18 mm Herculink)   Successful angioplasty Lt renal stent Antegrade dissection in distal Lt renal artery with decreased distal flow, too distal for stenting. There is blush in left kidney.  She will be monitored for 6-8 hrs for any signs of renal infarct.   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 Stenosis in the  right external carotid artery (<50%).  Stenosis in the left internal carotid artery (16-49%). Stenosis in the  left external carotid artery (<50%).  Antegrade right vertebral artery flow. Antegrade left vertebral artery  flow.  Follow up in 12 months is appropriate if clinically indicated.  Compared to the study done on 04/08/2020, left ICA stenosis was in the  range of 50-69%., now <50%.  Mesenteric Duplex 12/20/2020: :  Velocities suggest 70 to 99% stenosis in the superior mesenteric artery and >50% stenosis in the distal aorta. Extensive plaque was noted throughout abdominal aorta.   Abdominal aortogram with lower extremity and peripheral vascular intervention 12/28/2020: Aortogram reveals prior terminal aortic stenting prior renal stenting CO2 aortography performed for majority of diagnostic studies 70% SMA stenosis. 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 95% right common iliac stenosis Unremarkable SMA stenting. Unable to track a stent into the celiac artery.  I elected to not continue because of the risk of contrast nephropathy. Right common iliac stenosis stented.  Post stenting we noted perforation with extravasation in the right common iliac artery as well as ulceration in the aortic  stent. I elected to cover all of these lesions with terminal aortic stenting and bilateral common iliac artery stenting. Angiogram shows excellent flow through the terminal aorta and through the aortic bifurcation.  PLAN: Successful SMA revascularization.  Unfortunately required aorto and bilateral common iliac artery stenting.  Admit 4E. Needs dual antiplatelet therapy and high intensity statin going forward.  Continue IV hydration.  Check basic metabolic panel in the morning.  Give diet and see how she tolerates food.  Mesenteric duplex 01/16/2021: SMA stent not well visualized, however SMA is patent with no evidence of stenosis. Aortoiliac stent is patent with  no evidence of stenosis. >50% stenosis is noted distal to the right CIA stent.   EKG   05/02/2021: Sinus bradycardia at a rate of 52 bpm.  Left atrial enlargement.  LVH.  EKG 03/09/2021, no significant change.  03/09/2021: Sinus bradycardia at a rate of 57 bpm.  Left atrial enlargement.  Normal axis.  Poor R wave progression, cannot exclude anteroseptal infarct old.  LVH with secondary ST-T wave changes  03/24/2020: Normal sinus rhythm with rate of 58 bpm, normal axis, no evidence of ischemia.  Borderline criteria for LVH with R in V5 26 mm.   No significant change from EKG 02/10/2020.  Assessment     ICD-10-CM   1. Chest pain of uncertain etiology  X09.4 EKG 12-Lead    2. SOB (shortness of breath)  R06.02 EKG 12-Lead    PCV MYOCARDIAL PERFUSION WITH LEXISCAN    3. Resistant hypertension  I10 labetalol (NORMODYNE) 100 MG tablet      Meds ordered this encounter  Medications   labetalol (NORMODYNE) 100 MG tablet    Sig: Take 1 tablet (100 mg total) by mouth 2 (two) times daily.    Dispense:  60 tablet    Refill:  3    Medications Discontinued During This Encounter  Medication Reason   atenolol (TENORMIN) 50 MG tablet Change in therapy    Recommendations:   [name]  is a 60 y.o. Caucasian female  with  hypertension, COPD, diastolic CHF, ongoing tobacco abuse (2 cig a day), and difficult to control hypertension with CKD, with bilateral renal artery angioplasty, PAD with distal abdominal aorta stenosis S/P stenting with 10x 29 mm Omnilink Elite stent. She underwent coronary angiogram in June 2019 for nitroglycerin responsive chest pain that revealed normal coronary arteries.  History of lower GI bleed found to have colonic polyposis 08/2020 requiring 2 units of packed RBCs.  Renal arteriogram 8/21 revealed occluded left renal artery status post partial successful balloon angioplasty.  Followed by vascular surgery as she underwent elective stenting of SMA, terminal aorta, bilateral common iliac arteries for chronic mesenteric ischemia on 11/2020.  Presents for follow-up.  Reviewed and discussed with patient regarding results of recent chest CT, particularly aortic and coronary artery calcification, details above.  We will continue antiplatelet and statin therapy.  In regard to patient's hypertension, remains uncontrolled.  Blood pressure goal is <30/80 mmHg.  Patient was unable to tolerate Imdur 60 mg, therefore we will continue 30 mg daily.  We will switch patient from atenolol to labetalol 100 mg twice daily.  Patient will closely monitor blood pressure and heart rate and notify our office if she experiences any issues with transitioning to labetalol.  Patient will also notify our office if blood pressure remains above goal.  Regard to patient's symptoms of chest pain and shortness of breath, these are atypical.  However patient has multiple significant cardiovascular risk factors as well as coronary artery calcification noted on recent CT.  We will therefore obtain Lexiscan nuclear stress test as patient is unable to exercise on treadmill due to back and abdominal pain. Will defer management of vascular disease to vascular surgery, who continues to follow patient regularly.   Follow-up in 6 weeks, sooner if  needed, for shortness of breath, hypertension, results of cardiac testing.   Alethia Berthold, PA-C 05/02/2021, 11:35 AM Office: [phone]

## 2021-05-02 NOTE — Telephone Encounter (Signed)
Is this ok to fill? 

## 2021-05-03 ENCOUNTER — Other Ambulatory Visit: Payer: Self-pay | Admitting: Cardiology

## 2021-05-08 ENCOUNTER — Inpatient Hospital Stay (HOSPITAL_COMMUNITY)
Admission: EM | Admit: 2021-05-08 | Discharge: 2021-05-12 | DRG: 280 | Disposition: A | Discharge status: Home / Self Care | Payer: Medicare Other | Attending: Internal Medicine | Admitting: Internal Medicine

## 2021-05-08 ENCOUNTER — Other Ambulatory Visit: Payer: Self-pay

## 2021-05-08 ENCOUNTER — Encounter (HOSPITAL_COMMUNITY): Payer: Self-pay | Admitting: Emergency Medicine

## 2021-05-08 ENCOUNTER — Emergency Department (HOSPITAL_COMMUNITY): Payer: Medicare Other

## 2021-05-08 DIAGNOSIS — Z8249 Family history of ischemic heart disease and other diseases of the circulatory system: Secondary | ICD-10-CM

## 2021-05-08 DIAGNOSIS — I150 Renovascular hypertension: Secondary | ICD-10-CM | POA: Diagnosis present

## 2021-05-08 DIAGNOSIS — I959 Hypotension, unspecified: Secondary | ICD-10-CM | POA: Diagnosis not present

## 2021-05-08 DIAGNOSIS — Z8051 Family history of malignant neoplasm of kidney: Secondary | ICD-10-CM

## 2021-05-08 DIAGNOSIS — Z7902 Long term (current) use of antithrombotics/antiplatelets: Secondary | ICD-10-CM

## 2021-05-08 DIAGNOSIS — D649 Anemia, unspecified: Secondary | ICD-10-CM | POA: Diagnosis present

## 2021-05-08 DIAGNOSIS — Z9114 Patient's other noncompliance with medication regimen: Secondary | ICD-10-CM

## 2021-05-08 DIAGNOSIS — Z7982 Long term (current) use of aspirin: Secondary | ICD-10-CM

## 2021-05-08 DIAGNOSIS — I739 Peripheral vascular disease, unspecified: Secondary | ICD-10-CM | POA: Diagnosis present

## 2021-05-08 DIAGNOSIS — N280 Ischemia and infarction of kidney: Secondary | ICD-10-CM | POA: Diagnosis present

## 2021-05-08 DIAGNOSIS — I160 Hypertensive urgency: Secondary | ICD-10-CM

## 2021-05-08 DIAGNOSIS — M549 Dorsalgia, unspecified: Secondary | ICD-10-CM | POA: Diagnosis present

## 2021-05-08 DIAGNOSIS — R9431 Abnormal electrocardiogram [ECG] [EKG]: Secondary | ICD-10-CM

## 2021-05-08 DIAGNOSIS — K551 Chronic vascular disorders of intestine: Secondary | ICD-10-CM | POA: Diagnosis present

## 2021-05-08 DIAGNOSIS — N170 Acute kidney failure with tubular necrosis: Secondary | ICD-10-CM | POA: Diagnosis present

## 2021-05-08 DIAGNOSIS — I161 Hypertensive emergency: Secondary | ICD-10-CM | POA: Diagnosis not present

## 2021-05-08 DIAGNOSIS — Z888 Allergy status to other drugs, medicaments and biological substances status: Secondary | ICD-10-CM

## 2021-05-08 DIAGNOSIS — Z800 Family history of malignant neoplasm of digestive organs: Secondary | ICD-10-CM

## 2021-05-08 DIAGNOSIS — Z9049 Acquired absence of other specified parts of digestive tract: Secondary | ICD-10-CM

## 2021-05-08 DIAGNOSIS — Z8371 Family history of colonic polyps: Secondary | ICD-10-CM

## 2021-05-08 DIAGNOSIS — Z8541 Personal history of malignant neoplasm of cervix uteri: Secondary | ICD-10-CM

## 2021-05-08 DIAGNOSIS — G8929 Other chronic pain: Secondary | ICD-10-CM | POA: Diagnosis present

## 2021-05-08 DIAGNOSIS — Z955 Presence of coronary angioplasty implant and graft: Secondary | ICD-10-CM

## 2021-05-08 DIAGNOSIS — I701 Atherosclerosis of renal artery: Secondary | ICD-10-CM | POA: Diagnosis present

## 2021-05-08 DIAGNOSIS — N179 Acute kidney failure, unspecified: Secondary | ICD-10-CM | POA: Diagnosis present

## 2021-05-08 DIAGNOSIS — Z681 Body mass index (BMI) 19 or less, adult: Secondary | ICD-10-CM

## 2021-05-08 DIAGNOSIS — N184 Chronic kidney disease, stage 4 (severe): Secondary | ICD-10-CM | POA: Diagnosis present

## 2021-05-08 DIAGNOSIS — E559 Vitamin D deficiency, unspecified: Secondary | ICD-10-CM | POA: Diagnosis present

## 2021-05-08 DIAGNOSIS — R1084 Generalized abdominal pain: Secondary | ICD-10-CM

## 2021-05-08 DIAGNOSIS — K219 Gastro-esophageal reflux disease without esophagitis: Secondary | ICD-10-CM | POA: Diagnosis present

## 2021-05-08 DIAGNOSIS — F319 Bipolar disorder, unspecified: Secondary | ICD-10-CM | POA: Diagnosis present

## 2021-05-08 DIAGNOSIS — I252 Old myocardial infarction: Secondary | ICD-10-CM

## 2021-05-08 DIAGNOSIS — Z885 Allergy status to narcotic agent status: Secondary | ICD-10-CM

## 2021-05-08 DIAGNOSIS — J439 Emphysema, unspecified: Secondary | ICD-10-CM | POA: Diagnosis present

## 2021-05-08 DIAGNOSIS — F419 Anxiety disorder, unspecified: Secondary | ICD-10-CM | POA: Diagnosis present

## 2021-05-08 DIAGNOSIS — I200 Unstable angina: Secondary | ICD-10-CM | POA: Diagnosis not present

## 2021-05-08 DIAGNOSIS — R000 Tachycardia, unspecified: Secondary | ICD-10-CM | POA: Diagnosis present

## 2021-05-08 DIAGNOSIS — Z85828 Personal history of other malignant neoplasm of skin: Secondary | ICD-10-CM

## 2021-05-08 DIAGNOSIS — R079 Chest pain, unspecified: Secondary | ICD-10-CM | POA: Diagnosis present

## 2021-05-08 DIAGNOSIS — R519 Headache, unspecified: Secondary | ICD-10-CM | POA: Diagnosis present

## 2021-05-08 DIAGNOSIS — Z8719 Personal history of other diseases of the digestive system: Secondary | ICD-10-CM

## 2021-05-08 DIAGNOSIS — E874 Mixed disorder of acid-base balance: Secondary | ICD-10-CM | POA: Diagnosis present

## 2021-05-08 DIAGNOSIS — I130 Hypertensive heart and chronic kidney disease with heart failure and stage 1 through stage 4 chronic kidney disease, or unspecified chronic kidney disease: Secondary | ICD-10-CM | POA: Diagnosis present

## 2021-05-08 DIAGNOSIS — I10 Essential (primary) hypertension: Secondary | ICD-10-CM | POA: Diagnosis present

## 2021-05-08 DIAGNOSIS — Z20822 Contact with and (suspected) exposure to covid-19: Secondary | ICD-10-CM | POA: Diagnosis present

## 2021-05-08 DIAGNOSIS — G2581 Restless legs syndrome: Secondary | ICD-10-CM | POA: Diagnosis present

## 2021-05-08 DIAGNOSIS — Z833 Family history of diabetes mellitus: Secondary | ICD-10-CM

## 2021-05-08 DIAGNOSIS — F209 Schizophrenia, unspecified: Secondary | ICD-10-CM | POA: Clinically undetermined

## 2021-05-08 DIAGNOSIS — Z86010 Personal history of colonic polyps: Secondary | ICD-10-CM

## 2021-05-08 DIAGNOSIS — I5032 Chronic diastolic (congestive) heart failure: Secondary | ICD-10-CM | POA: Diagnosis present

## 2021-05-08 DIAGNOSIS — Z90721 Acquired absence of ovaries, unilateral: Secondary | ICD-10-CM

## 2021-05-08 DIAGNOSIS — E7800 Pure hypercholesterolemia, unspecified: Secondary | ICD-10-CM | POA: Diagnosis present

## 2021-05-08 DIAGNOSIS — Z79899 Other long term (current) drug therapy: Secondary | ICD-10-CM

## 2021-05-08 DIAGNOSIS — E785 Hyperlipidemia, unspecified: Secondary | ICD-10-CM | POA: Diagnosis present

## 2021-05-08 DIAGNOSIS — I25119 Atherosclerotic heart disease of native coronary artery with unspecified angina pectoris: Secondary | ICD-10-CM | POA: Diagnosis present

## 2021-05-08 DIAGNOSIS — F17210 Nicotine dependence, cigarettes, uncomplicated: Secondary | ICD-10-CM | POA: Diagnosis present

## 2021-05-08 DIAGNOSIS — I21A1 Myocardial infarction type 2: Secondary | ICD-10-CM | POA: Diagnosis present

## 2021-05-08 DIAGNOSIS — Z881 Allergy status to other antibiotic agents status: Secondary | ICD-10-CM

## 2021-05-08 DIAGNOSIS — E871 Hypo-osmolality and hyponatremia: Secondary | ICD-10-CM | POA: Diagnosis not present

## 2021-05-08 HISTORY — DX: Angina pectoris, unspecified: I20.9

## 2021-05-08 LAB — BASIC METABOLIC PANEL
Anion gap: 14 (ref 5–15)
BUN: 25 mg/dL — ABNORMAL HIGH (ref 6–20)
CO2: 21 mmol/L — ABNORMAL LOW (ref 22–32)
Calcium: 9.6 mg/dL (ref 8.9–10.3)
Chloride: 98 mmol/L (ref 98–111)
Creatinine, Ser: 2.86 mg/dL — ABNORMAL HIGH (ref 0.44–1.00)
GFR, Estimated: 19 mL/min — ABNORMAL LOW (ref >60)
Glucose, Bld: 160 mg/dL — ABNORMAL HIGH (ref 70–99)
Potassium: 3.8 mmol/L (ref 3.5–5.1)
Sodium: 133 mmol/L — ABNORMAL LOW (ref 135–145)

## 2021-05-08 LAB — CBC WITH DIFFERENTIAL/PLATELET
Abs Immature Granulocytes: 0.03 10*3/uL (ref 0.00–0.07)
Basophils Absolute: 0.1 10*3/uL (ref 0.0–0.1)
Basophils Relative: 1 %
Eosinophils Absolute: 0 10*3/uL (ref 0.0–0.5)
Eosinophils Relative: 0 %
HCT: 35 % — ABNORMAL LOW (ref 36.0–46.0)
Hemoglobin: 11.8 g/dL — ABNORMAL LOW (ref 12.0–15.0)
Immature Granulocytes: 0 %
Lymphocytes Relative: 9 %
Lymphs Abs: 0.7 10*3/uL (ref 0.7–4.0)
MCH: 30.7 pg (ref 26.0–34.0)
MCHC: 33.7 g/dL (ref 30.0–36.0)
MCV: 91.1 fL (ref 80.0–100.0)
Monocytes Absolute: 0.5 10*3/uL (ref 0.1–1.0)
Monocytes Relative: 6 %
Neutro Abs: 6.4 10*3/uL (ref 1.7–7.7)
Neutrophils Relative %: 84 %
Platelets: 461 10*3/uL — ABNORMAL HIGH (ref 150–400)
RBC: 3.84 MIL/uL — ABNORMAL LOW (ref 3.87–5.11)
RDW: 14.5 % (ref 11.5–15.5)
WBC: 7.6 10*3/uL (ref 4.0–10.5)
nRBC: 0 % (ref 0.0–0.2)

## 2021-05-08 LAB — TROPONIN I (HIGH SENSITIVITY): Troponin I (High Sensitivity): 37 ng/L — ABNORMAL HIGH (ref <18)

## 2021-05-08 LAB — MAGNESIUM: Magnesium: 1.5 mg/dL — ABNORMAL LOW (ref 1.7–2.4)

## 2021-05-08 MED ORDER — SODIUM CHLORIDE 0.9 % IV SOLN
12.5000 mg | Freq: Four times a day (QID) | INTRAVENOUS | Status: DC | PRN
  Administered 2021-05-09: 12.5 mg via INTRAVENOUS
  Filled 2021-05-08: qty 0.5

## 2021-05-08 MED ORDER — ONDANSETRON 4 MG PO TBDP
4.0000 mg | ORAL_TABLET | Freq: Once | ORAL | Status: DC
  Filled 2021-05-08: qty 1

## 2021-05-08 NOTE — ED Triage Notes (Signed)
BIB EMS for chest pain. Pt states she has mid sternal CP non radiating.  N/V and headache.  Given 1 nitro en route with relief from 9 to 6 on the pain scale.  Pt has 24 G in left thumb.  Also given 4 mg zofran IV.  Pt has not been taking her plavix for at least a week. She is unsure of her hx or why she takes it.

## 2021-05-08 NOTE — ED Provider Notes (Signed)
[facility] EMERGENCY DEPARTMENT Provider Note   CSN: [id number] Arrival date & time: 05/08/21  2149     History Chief Complaint  Patient presents with   Chest Pain    [name] is a 60 y.o. female.  Patient with history of HTN, diverticulitis, IBS, , SBO, partial colectomy, anxiety, CHF, COPD in a continuous smoker, GERD, CAD, PAD to ED with abdominal pain that started 3 weeks ago associated with nausea and vomiting. Pain is sometimes periumbilical and sometimes generalized. No fever, no melena, no hematemesis. Last night she started have hot/cold chills and sweating as well as onset of substernal chest pain. Chest pain continued through today so she called EMS. She was given 3 NTG and states this relieved her chest pain. She has nitro at home but did not take her own though she states her chest pain was the same as her previous MI. She reports SOB that remains unchanged with resolution of her chest pain.   The history is provided by the patient. No language interpreter was used.  Chest Pain     Past Medical History:  Diagnosis Date   Anemia 2008   Anxiety    Aortic stenosis    Arthritis    "all my joints ache" (09/07/2014)   Asthma    Bipolar 1 disorder ([place])    Blood transfusion without reported diagnosis    Bradycardia    Bruit    Carpal tunnel syndrome    Cataract    forming   Cervical cancer ([place]) 1985   CHF (congestive heart failure) ([place])    Chronic kidney disease (CKD), stage V (HCC)    COPD (chronic obstructive pulmonary disease) ([place]) 2000   Coronary artery disease    Daily headache    Depression 2000   Diverticulitis 2008   Emphysema of lung (HCC)    GERD (gastroesophageal reflux disease)    Glaucoma    Heart murmur    History of colon polyps 2009   HLD (hyperlipidemia) 2013   Hypertension 2013   Hypovitaminosis D    IBS (irritable bowel syndrome) 2008   Myocardial infarction ([place])    2015   PAD (peripheral artery disease)  ([place])    Pancreatitis 10/2011   Pneumonia 07/2014   RLS (restless legs syndrome)    Schizophrenia (HCC)    Shortness of breath    Skin cancer    Small bowel obstruction ([place]) 2008   Thyroid disease     Patient Active Problem List   Diagnosis Date Noted   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[place]) 12/30/2020   Chronic mesenteric ischemia ([place]) 12/28/2020   Polyp of ascending colon    AVM (arteriovenous malformation) of small bowel, acquired with hemorrhage    GIB (gastrointestinal bleeding) 08/23/2020   Hematochezia    Diverticulosis of colon without hemorrhage    Pain of upper abdomen    Angiodysplasia of colon 08/16/2020   GI bleed 08/06/2020   Acute GI bleeding 08/05/2020   Acute blood loss anemia 08/05/2020   Renal artery stenosis ([place]) 05/10/2020   Abdominal aortic stenosis 11/30/2018   Pure hypercholesterolemia 10/22/2018   Abnormal stress test 03/21/2018   At high risk for injury related to fall 12/26/2017   Renal artery stenosis, native, bilateral ([place]) 10/27/2017   Benign neoplasm of transverse colon    Benign neoplasm of descending colon    Benign neoplasm of sigmoid colon    Abnormality on screening test 06/22/2016   Chest pain 11/01/2015  History of partial colectomy 10/19/2014   Renovascular hypertension, malignant 09/07/2014   Anemia, chronic disease 07/30/2014   Heart failure with preserved ejection fraction ([place]) 07/02/2014   COPD GOLD 0 / still smoking 07/02/2014   CKD (chronic kidney disease) stage 3, GFR 30-59 ml/min (HCC) 07/02/2014   Protein-calorie malnutrition, severe ([place]) 07/02/2014   Hyperparathyroidism, 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   Postsurgical menopause 08/14/2013   Vaginal atrophy 08/14/2013   Screening for breast cancer 07/21/2012   Diverticulitis of colon with perforation 06/18/2012   Affective bipolar disorder ([place]) 02/02/2009   ADHESIONS, INTESTINAL W/OBSTRUCTION 08/10/2008   Irritable bowel syndrome 08/10/2008   Abdominal pain,  generalized 08/10/2008   FECAL OCCULT BLOOD 08/10/2008   Anemia, iron deficiency 08/10/2008   Anxiety state 08/09/2008   DEPRESSION 08/09/2008   Essential hypertension 08/09/2008   ASTHMA 08/09/2008   ESOPHAGEAL STRICTURE 08/09/2008   GERD 08/09/2008   HIATAL HERNIA 08/09/2008   Diverticulosis of large intestine 08/09/2008   ARTHRITIS 08/09/2008   HEADACHE, CHRONIC 08/09/2008   SKIN CANCER, HX OF 08/09/2008   COLONIC POLYPS, HYPERPLASTIC, HX OF 08/09/2008   Cigarette smoker 05/17/2008   Schizophrenia ([place]) 04/30/2008    Past Surgical History:  Procedure Laterality Date   ABDOMINAL ANGIOGRAM N/A 09/07/2014   Procedure: ABDOMINAL ANGIOGRAM;  Surgeon: Laverda Page, MD;  Location: [facility] CATH LAB;  Service: Cardiovascular;  Laterality: N/A;   ABDOMINAL AORTOGRAM W/LOWER EXTREMITY N/A 12/28/2020   Procedure: ABDOMINAL AORTOGRAM W/LOWER EXTREMITY;  Surgeon: Cherre Robins, MD;  Location: [place] CV LAB;  Service: Cardiovascular;  Laterality: N/A;   APPENDECTOMY  10/01/1994   BIOPSY  08/06/2020   Procedure: BIOPSY;  Surgeon: Jackquline Denmark, MD;  Location: [facility] ENDOSCOPY;  Service: Endoscopy;;   COLON SURGERY  10/01/2006   6 inches of colon removed due to obstruction   COLONOSCOPY WITH PROPOFOL N/A 10/25/2016   Procedure: COLONOSCOPY WITH PROPOFOL;  Surgeon: Milus Banister, MD;  Location: [facility]OS[facility];  Service: Endoscopy;  Laterality: N/A;   COLONOSCOPY WITH PROPOFOL N/A 08/07/2020   Procedure: COLONOSCOPY WITH PROPOFOL;  Surgeon: Jackquline Denmark, MD;  Location: [facility];  Service: Endoscopy;  Laterality: N/A;   COLONOSCOPY WITH PROPOFOL N/A 08/27/2020   Procedure: COLONOSCOPY WITH PROPOFOL;  Surgeon: Lavena Bullion, DO;  Location: [facility];  Service: Gastroenterology;  Laterality: N/A;   CORONARY ANGIOPLASTY WITH STENT PLACEMENT  09/07/2014   "2"   ENTEROSCOPY N/A 08/27/2020   Procedure: ENTEROSCOPY;  Surgeon: Lavena Bullion, DO;  Location: [facility];  Service:  Gastroenterology;  Laterality: N/A;  Push enteroscopy    ESOPHAGOGASTRODUODENOSCOPY (EGD) WITH PROPOFOL N/A 08/06/2020   Procedure: ESOPHAGOGASTRODUODENOSCOPY (EGD) WITH PROPOFOL;  Surgeon: Jackquline Denmark, MD;  Location: [facility];  Service: Endoscopy;  Laterality: N/A;   GIVENS CAPSULE STUDY N/A 08/24/2020   Procedure: GIVENS CAPSULE STUDY;  Surgeon: Lavena Bullion, DO;  Location: [facility];  Service: Gastroenterology;  Laterality: N/A;   HOT HEMOSTASIS N/A 08/07/2020   Procedure: HOT HEMOSTASIS (ARGON PLASMA COAGULATION/BICAP);  Surgeon: Jackquline Denmark, MD;  Location: [facility];  Service: Endoscopy;  Laterality: N/A;   HOT HEMOSTASIS N/A 08/27/2020   Procedure: HOT HEMOSTASIS (ARGON PLASMA COAGULATION/BICAP);  Surgeon: Lavena Bullion, DO;  Location: [facility];  Service: Gastroenterology;  Laterality: N/A;   LEFT HEART CATH AND CORONARY ANGIOGRAPHY N/A 03/25/2018   Procedure: LEFT HEART CATH AND CORONARY ANGIOGRAPHY;  Surgeon: Nigel Mormon, MD;  Location: [place] CV LAB;  Service: Cardiovascular;  Laterality: N/A;   LEFT HEART CATHETERIZATION  WITH CORONARY ANGIOGRAM N/A 09/07/2014   Procedure: LEFT HEART CATHETERIZATION WITH CORONARY ANGIOGRAM;  Surgeon: Laverda Page, MD;  Location: [facility] CATH LAB;  Service: Cardiovascular;  Laterality: N/A;   LOWER EXTREMITY ANGIOGRAPHY  10/29/2017   Procedure: Lower Extremity Angiography;  Surgeon: Adrian Prows, MD;  Location: [place] CV LAB;  Service: Cardiovascular;;   PERIPHERAL VASCULAR CATHETERIZATION N/A 11/01/2015   Procedure: Renal Angiography;  Surgeon: Adrian Prows, MD;  Location: [place] [facility] LAB;  Service: Cardiovascular;  Laterality: N/A;   PERIPHERAL VASCULAR CATHETERIZATION N/A 04/10/2016   Procedure: Renal Angiography;  Surgeon: Adrian Prows, MD;  Location: [place] CV LAB;  Service: Cardiovascular;  Laterality: N/A;   PERIPHERAL VASCULAR CATHETERIZATION  04/10/2016   Procedure: Peripheral Vascular Intervention;   Surgeon: Adrian Prows, MD;  Location: [place] CV LAB;  Service: Cardiovascular;;   PERIPHERAL VASCULAR INTERVENTION  10/29/2017   Procedure: PERIPHERAL VASCULAR INTERVENTION;  Surgeon: Adrian Prows, MD;  Location: [place] [facility] LAB;  Service: Cardiovascular;;   PERIPHERAL VASCULAR INTERVENTION Bilateral 12/28/2020   Procedure: PERIPHERAL VASCULAR INTERVENTION;  Surgeon: Cherre Robins, MD;  Location: [place] [facility] LAB;  Service: Cardiovascular;  Laterality: Bilateral;   POLYPECTOMY  08/07/2020   Procedure: POLYPECTOMY;  Surgeon: Jackquline Denmark, MD;  Location: [facility];  Service: Endoscopy;;   POLYPECTOMY  08/27/2020   Procedure: POLYPECTOMY;  Surgeon: Lavena Bullion, DO;  Location: [facility];  Service: Gastroenterology;;   POLYPECTOMY     RENAL ANGIOGRAPHY N/A 10/29/2017   Procedure: RENAL ANGIOGRAPHY;  Surgeon: Adrian Prows, MD;  Location: [place] CV LAB;  Service: Cardiovascular;  Laterality: N/A;   RENAL ANGIOGRAPHY N/A 05/10/2020   Procedure: RENAL ANGIOGRAPHY;  Surgeon: Nigel Mormon, MD;  Location: [place] [facility];  Service: Cardiovascular;  Laterality: N/A;   RIGHT OOPHORECTOMY Right 10/01/1994   TOTAL ABDOMINAL HYSTERECTOMY  10/02/1995   UPPER GASTROINTESTINAL ENDOSCOPY       OB History     Gravida  2   Para      Term      Preterm      AB  1   Living  1      SAB      IAB      Ectopic  1   Multiple      Live Births  1           Family History  Problem Relation Age of Onset   Other Mother        many bowel obstructions   Heart disease Mother    Colon polyps Mother    Kidney cancer Father    Bone cancer Father    Diabetes Father    Heart disease Father    Heart disease Brother    Colon cancer Paternal Grandfather    Diabetes Daughter    Esophageal cancer Neg Hx    Rectal cancer Neg Hx    Stomach cancer Neg Hx     Social History   Tobacco Use   Smoking status: Some Days    Packs/day: 1.50    Years: 40.00    Pack  years: 60.00    Types: Cigarettes   Smokeless tobacco: Never  Vaping Use   Vaping Use: Former  Substance Use Topics   Alcohol use: No   Drug use: Yes    Frequency: 3.0 times per week    Types: Marijuana    Comment: last use yesterday    Home Medications Prior to Admission medications  Medication Sig Start Date End Date Taking? Authorizing Provider  acetaminophen (TYLENOL) 500 MG tablet Take 1,000 mg by mouth every 8 (eight) hours as needed for mild pain or headache.   Yes [provider]  albuterol (PROVENTIL HFA;VENTOLIN HFA) 108 (90 BASE) MCG/ACT inhaler Inhale 2 puffs into the lungs every 6 (six) hours as needed for wheezing or shortness of breath (asthma).   Yes [provider]  ALPRAZolam Duanne Moron) 1 MG tablet Take 0.5 tablets by mouth 2 (two) times daily as needed for anxiety. 04/15/17  Yes [provider]  amLODipine (NORVASC) 10 MG tablet TAKE 1 TABLET BY MOUTH ONCE DAILY Patient taking differently: Take 10 mg by mouth daily. 04/13/21  Yes Cantwell, Celeste C, PA-C  aspirin EC 81 MG EC tablet Take 1 tablet (81 mg total) by mouth daily. Swallow whole. 12/30/20  Yes Cherre Robins, MD  atorvastatin (LIPITOR) 80 MG tablet Take 80 mg by mouth daily.    Yes [provider]  azelastine (ASTELIN) 0.1 % nasal spray Place 1 spray into both nostrils daily as needed for rhinitis or allergies.  06/24/19 05/09/21 Yes [provider]  clopidogrel (PLAVIX) 75 MG tablet TAKE 1 TABLET BY MOUTH ONCE A DAY Patient taking differently: Take 75 mg by mouth daily. 05/03/21  Yes Cantwell, Celeste C, PA-C  ezetimibe (ZETIA) 10 MG tablet TAKE 1 TABLET BY MOUTH ONCE A DAY Patient taking differently: Take 10 mg by mouth daily. 05/03/21  Yes Cantwell, Celeste C, PA-C  isosorbide mononitrate (IMDUR) 60 MG 24 hr tablet Take 1 tablet (60 mg total) by mouth daily. Patient taking differently: Take 30 mg by mouth daily. 03/09/21  Yes Cantwell, Celeste C, PA-C  labetalol  (NORMODYNE) 100 MG tablet Take 1 tablet (100 mg total) by mouth 2 (two) times daily. 05/02/21  Yes Cantwell, Celeste C, PA-C  linaclotide (LINZESS) 72 MCG capsule Take 1 capsule (72 mcg total) by mouth daily before breakfast. 08/22/20  Yes Lemmon, Lavone Nian, PA  megestrol (MEGACE) 40 MG tablet Take 1 tablet by mouth daily as needed (appetite). 01/24/21  Yes [provider]  mometasone-formoterol (DULERA) 100-5 MCG/ACT AERO Take 2 puffs first thing in am and then another 2 puffs about 12 hours later. Patient taking differently: Inhale 2 puffs into the lungs in the morning and at bedtime. 04/26/17  Yes Tanda Rockers, MD  nitroGLYCERIN (NITROSTAT) 0.4 MG SL tablet Place 1 tablet (0.4 mg total) under the tongue every 5 (five) minutes as needed for chest pain. 10/15/19  Yes Miquel Dunn, NP  oxyCODONE-acetaminophen (PERCOCET/ROXICET) 5-325 MG tablet Take 1 tablet by mouth every 6 (six) hours as needed for moderate pain. 12/30/20  Yes Cherre Robins, MD  pantoprazole (PROTONIX) 40 MG tablet TAKE 1 TABLET BY MOUTH TWICE (2) DAILY Patient taking differently: Take 40 mg by mouth 2 (two) times daily. 06/08/20  Yes Adrian Prows, MD  promethazine (PHENERGAN) 25 MG tablet Take 25 mg by mouth daily as needed for nausea or vomiting. 08/16/20  Yes [provider]  venlafaxine XR (EFFEXOR XR) 150 MG 24 hr capsule Take 1 capsule (150 mg total) by mouth daily with breakfast. Patient taking differently: Take 300 mg by mouth daily with breakfast. 11/09/15  Yes Patel-Mills, Hanna, PA-C    Allergies    Doxycycline, Hydralazine, Hydrocodone-acetaminophen, and Iohexol  Review of Systems   Review of Systems  Cardiovascular:  Positive for chest pain.   Physical Exam Updated Vital Signs BP (!) 180/104   Pulse  100   Temp 98.3 F (36.8 C)   Resp 15   Ht 5' 1.5" (1.562 m)   Wt 40.6 kg   SpO2 93%   BMI 16.66 kg/m   Physical Exam  ED Results / Procedures / Treatments   Labs (all labs  ordered are listed, but only abnormal results are displayed) Labs Reviewed  BASIC METABOLIC PANEL - Abnormal; Notable for the following components:      Result Value   Sodium 133 (*)    CO2 21 (*)    Glucose, Bld 160 (*)    BUN 25 (*)    Creatinine, Ser 2.86 (*)    GFR, Estimated 19 (*)    All other components within normal limits  CBC WITH DIFFERENTIAL/PLATELET - Abnormal; Notable for the following components:   RBC 3.84 (*)    Hemoglobin 11.8 (*)    HCT 35.0 (*)    Platelets 461 (*)    All other components within normal limits  MAGNESIUM - Abnormal; Notable for the following components:   Magnesium 1.5 (*)    All other components within normal limits  HEPATIC FUNCTION PANEL - Abnormal; Notable for the following components:   Alkaline Phosphatase 143 (*)    Total Bilirubin 0.2 (*)    All other components within normal limits  TROPONIN I (HIGH SENSITIVITY) - Abnormal; Notable for the following components:   Troponin I (High Sensitivity) 37 (*)    All other components within normal limits  TROPONIN I (HIGH SENSITIVITY) - Abnormal; Notable for the following components:   Troponin I (High Sensitivity) 77 (*)    All other components within normal limits  LIPASE, BLOOD  LACTIC ACID, PLASMA  LACTIC ACID, PLASMA    EKG EKG Interpretation  Date/Time:  Monday May 08 2021 23:33:31 EDT Ventricular Rate:  114 PR Interval:  108 QRS Duration: 82 QT Interval:  299 QTC Calculation: 412 R Axis:   76 Text Interpretation: Sinus tachycardia Ventricular premature complex Aberrant complex Probable left atrial enlargement LVH with secondary repolarization abnormality ST depression, consider ischemia, diffuse lds When compared with ECG of EARLIER SAME DATE No significant change was found Confirmed by Delora Fuel ([id number]) on 05/09/2021 12:06:33 AM  Radiology CT ABDOMEN PELVIS WO CONTRAST  Result Date: 05/09/2021 CLINICAL DATA:  Abdominal pain EXAM: CT ABDOMEN AND PELVIS WITHOUT CONTRAST  TECHNIQUE: Multidetector CT imaging of the abdomen and pelvis was performed following the standard protocol without IV contrast. COMPARISON:  08/24/2020 FINDINGS: Lower chest: Emphysematous changes are noted in the lung bases. A somewhat triangular density is noted in the left lower lobe laterally stable in appearance from the prior exam. Hepatobiliary: No focal liver abnormality is seen. No gallstones, gallbladder wall thickening, or biliary dilatation. Pancreas: Unremarkable. No pancreatic ductal dilatation or surrounding inflammatory changes. Spleen: Normal in size without focal abnormality. Adrenals/Urinary Tract: Adrenal glands are within normal limits. Left kidney is somewhat shrunken when compared with the prior exam. The right kidney appears within normal limits. No renal calculi or obstructive changes are seen. Bladder is partially distended. Stomach/Bowel: No obstructive or inflammatory changes of the colon are seen. Appendix has been surgically removed. Small bowel and stomach are within normal limits. Vascular/Lymphatic: Heavy atherosclerotic calcifications of the abdominal aorta are noted. Bi iliac stenting is noted. No sizable adenopathy is seen. Reproductive: Status post hysterectomy. No adnexal masses. Other: No abdominal wall hernia or abnormality. No abdominopelvic ascites. Musculoskeletal: No acute or significant osseous findings. IMPRESSION: No acute abnormality noted. Slight decrease in size  of the left kidney when compared with the prior exam. This is likely related to renal artery stenosis. Stable nodule in the left lung base dating back to 2013. No further follow-up is recommended. Aortic Atherosclerosis (ICD10-I70.0) and Emphysema (ICD10-J43.9). Electronically Signed   By: Inez Catalina M.D.   On: 05/09/2021 02:22   DG Chest 2 View  Result Date: 05/08/2021 CLINICAL DATA:  CHEST PAIN EXAM: CHEST - 2 VIEW COMPARISON:  Radiograph 06/05/2017 FINDINGS: Chronic hyperinflation and coarsened  interstitial changes are similar to comparison radiographs. No new consolidative process. No pneumothorax or visible layering effusion. Stable cardiomediastinal contours with a calcified aorta. No acute osseous or soft tissue abnormality. Telemetry leads overlie the chest. IMPRESSION: No acute cardiopulmonary abnormality. Chronic hyperinflation, coarsened interstitial changes. Electronically Signed   By: Lovena Le M.D.   On: 05/08/2021 22:37    Procedures Procedures   Medications Ordered in ED Medications  ondansetron (ZOFRAN-ODT) disintegrating tablet 4 mg (4 mg Oral Not Given 05/08/21 2337)  promethazine (PHENERGAN) 12.5 mg in sodium chloride 0.9 % 50 mL IVPB (0 mg Intravenous Stopped 05/09/21 0020)  labetalol (NORMODYNE) injection 10 mg (10 mg Intravenous Given 05/09/21 0031)  HYDROmorphone (DILAUDID) injection 0.5 mg (0.5 mg Intravenous Given 05/09/21 0350)    ED Course  I have reviewed the triage vital signs and the nursing notes.  Pertinent labs & imaging results that were available during my care of the patient were reviewed by me and considered in my medical decision making (see chart for details).    MDM Rules/Calculators/A&P                           Patient to ED for evaluation of abdominal pain for 3 weeks, onset severe chest pain, SOB, diaphoresis last night, resolved with NTG x 3 by EMS. "Felt like my MI".   Patient is tachycardic, hypertensive on arrival. EKG done and shows ST depression diffuse leads, new on this EKG when compared to previous. Labs pending. Pain addressed.   VS improved. She continues to have abdominal pain, no chest pain. CT reviewed and shows no acute changes. Renal function at baseline/improved. Initial troponin elevated as previous at 37. Delta delayed due to lab error, but resulted at 51. She is still chest pain free.   Dr. Einar Gip consulted who advises admit to medicine and they will consult for likely catheterization. Hospitalist paged. Patient aware of  need for admission.  Final Clinical Impression(s) / ED Diagnoses Final diagnoses:  None   Abdominal pain Angina Abnormal EKG  Rx / DC Orders ED Discharge Orders     None        Charlann Lange, PA-C 05/09/21 [place]    Davonna Belling, MD 05/10/21 [phone]

## 2021-05-08 NOTE — ED Provider Notes (Signed)
Emergency Medicine Provider Triage Evaluation Note  [name] , a 60 y.o. female  was evaluated in triage.  Pt complains of substernal chest pain with associated shortness of breath that started this morning.  Nonradiating, nonexertional nature, associated nausea and vomiting with NBNB emesis.  Headache.  Pain improved with nitro by EMS in route.  Noncompliant with her Plavix.  History of MI.  Review of Systems  Positive: Chest pain, shortness of breath, nausea, vomiting, lightheadedness Negative: Syncope, diarrhea  Physical Exam  BP (!) 164/100 (BP Location: Left Arm)   Pulse (!) 135   Temp 99.3 F (37.4 C) (Oral)   Resp 20   Ht 5' 1.5" (1.562 m)   Wt 40.6 kg   SpO2 96%   BMI 16.66 kg/m  Gen:   Awake, obviously uncomfortable Resp:  Normal effort, tachypneic MSK:   Moves extremities without difficulty  Other:  Tachycardic with regular rhythm.  Lungs CTA B.  Medical Decision Making  Medically screening exam initiated at 10:02 PM.  Appropriate orders placed.  [name] was informed that the remainder of the evaluation will be completed by another provider, this initial triage assessment does not replace that evaluation, and the importance of remaining in the ED until their evaluation is complete.  This chart was dictated using voice recognition software, Dragon. Despite the best efforts of this provider to proofread and correct errors, errors may still occur which can change documentation meaning.    [name] 05/08/21 2203    Cheyenne Lower, MD 05/08/21 [phone]

## 2021-05-09 ENCOUNTER — Observation Stay (HOSPITAL_BASED_OUTPATIENT_CLINIC_OR_DEPARTMENT_OTHER): Payer: Medicare Other

## 2021-05-09 ENCOUNTER — Encounter (HOSPITAL_COMMUNITY): Payer: Self-pay | Admitting: Cardiology

## 2021-05-09 ENCOUNTER — Ambulatory Visit (HOSPITAL_COMMUNITY)
Admission: EM | Disposition: A | Discharge status: Home / Self Care | Payer: Self-pay | Source: Home / Self Care | Attending: Internal Medicine

## 2021-05-09 ENCOUNTER — Emergency Department (HOSPITAL_COMMUNITY): Payer: Medicare Other

## 2021-05-09 ENCOUNTER — Ambulatory Visit: Payer: Medicare Other | Admitting: Gastroenterology

## 2021-05-09 ENCOUNTER — Observation Stay (HOSPITAL_COMMUNITY): Payer: Medicare Other

## 2021-05-09 DIAGNOSIS — I701 Atherosclerosis of renal artery: Secondary | ICD-10-CM

## 2021-05-09 DIAGNOSIS — R079 Chest pain, unspecified: Secondary | ICD-10-CM | POA: Diagnosis not present

## 2021-05-09 DIAGNOSIS — I10 Essential (primary) hypertension: Secondary | ICD-10-CM

## 2021-05-09 DIAGNOSIS — R1084 Generalized abdominal pain: Secondary | ICD-10-CM | POA: Diagnosis not present

## 2021-05-09 DIAGNOSIS — K551 Chronic vascular disorders of intestine: Secondary | ICD-10-CM

## 2021-05-09 DIAGNOSIS — N184 Chronic kidney disease, stage 4 (severe): Secondary | ICD-10-CM

## 2021-05-09 DIAGNOSIS — I200 Unstable angina: Secondary | ICD-10-CM

## 2021-05-09 DIAGNOSIS — R9431 Abnormal electrocardiogram [ECG] [EKG]: Secondary | ICD-10-CM | POA: Diagnosis not present

## 2021-05-09 DIAGNOSIS — I150 Renovascular hypertension: Secondary | ICD-10-CM

## 2021-05-09 HISTORY — PX: LEFT HEART CATH AND CORONARY ANGIOGRAPHY: CATH118249

## 2021-05-09 LAB — HEPATIC FUNCTION PANEL
ALT: 13 U/L (ref 0–44)
AST: 21 U/L (ref 15–41)
Albumin: 3.5 g/dL (ref 3.5–5.0)
Alkaline Phosphatase: 143 U/L — ABNORMAL HIGH (ref 38–126)
Bilirubin, Direct: <0.1 mg/dL (ref 0.0–0.2)
Total Bilirubin: 0.2 mg/dL — ABNORMAL LOW (ref 0.3–1.2)
Total Protein: 6.9 g/dL (ref 6.5–8.1)

## 2021-05-09 LAB — LACTIC ACID, PLASMA
Lactic Acid, Venous: 0.8 mmol/L (ref 0.5–1.9)
Lactic Acid, Venous: 2.2 mmol/L (ref 0.5–1.9)
Lactic Acid, Venous: 1.4 mmol/L (ref 0.5–1.9)

## 2021-05-09 LAB — RESP PANEL BY RT-PCR (FLU A&B, COVID) ARPGX2
Influenza A by PCR: NEGATIVE
Influenza B by PCR: NEGATIVE
SARS Coronavirus 2 by RT PCR: NEGATIVE

## 2021-05-09 LAB — LIPASE, BLOOD: Lipase: 28 U/L (ref 11–51)

## 2021-05-09 LAB — HIV ANTIBODY (ROUTINE TESTING W REFLEX): HIV Screen 4th Generation wRfx: NONREACTIVE

## 2021-05-09 LAB — TROPONIN I (HIGH SENSITIVITY): Troponin I (High Sensitivity): 77 ng/L — ABNORMAL HIGH (ref <18)

## 2021-05-09 SURGERY — LEFT HEART CATH AND CORONARY ANGIOGRAPHY
Anesthesia: LOCAL

## 2021-05-09 MED ORDER — ALPRAZOLAM 0.5 MG PO TABS
0.5000 mg | ORAL_TABLET | Freq: Two times a day (BID) | ORAL | Status: DC | PRN
  Administered 2021-05-09 – 2021-05-12 (×4): 0.5 mg via ORAL
  Filled 2021-05-09 (×4): qty 1

## 2021-05-09 MED ORDER — ACETAMINOPHEN 325 MG PO TABS: 650.0000 mg | ORAL_TABLET | ORAL | Status: DC | PRN

## 2021-05-09 MED ORDER — ONDANSETRON HCL 4 MG/2ML IJ SOLN
4.0000 mg | Freq: Four times a day (QID) | INTRAMUSCULAR | Status: DC | PRN
  Administered 2021-05-09: 4 mg via INTRAVENOUS
  Filled 2021-05-09: qty 2

## 2021-05-09 MED ORDER — SODIUM CHLORIDE 0.9 % IV BOLUS
250.0000 mL | Freq: Once | INTRAVENOUS | Status: AC
  Administered 2021-05-09: 250 mL via INTRAVENOUS

## 2021-05-09 MED ORDER — VENLAFAXINE HCL ER 75 MG PO CP24
300.0000 mg | ORAL_CAPSULE | Freq: Every day | ORAL | Status: DC
  Administered 2021-05-09 – 2021-05-12 (×4): 300 mg via ORAL
  Filled 2021-05-09: qty 2
  Filled 2021-05-09: qty 4
  Filled 2021-05-09: qty 2
  Filled 2021-05-09 (×2): qty 4
  Filled 2021-05-09: qty 2

## 2021-05-09 MED ORDER — HEPARIN SODIUM (PORCINE) 5000 UNIT/ML IJ SOLN
5000.0000 [IU] | Freq: Three times a day (TID) | INTRAMUSCULAR | Status: DC
  Administered 2021-05-09 – 2021-05-11 (×8): 5000 [IU] via SUBCUTANEOUS
  Filled 2021-05-09 (×8): qty 1

## 2021-05-09 MED ORDER — AMLODIPINE BESYLATE 10 MG PO TABS
10.0000 mg | ORAL_TABLET | Freq: Every day | ORAL | Status: DC
  Administered 2021-05-09: 10 mg via ORAL
  Filled 2021-05-09: qty 2

## 2021-05-09 MED ORDER — MAGNESIUM SULFATE 2 GM/50ML IV SOLN
2.0000 g | Freq: Once | INTRAVENOUS | Status: AC
  Administered 2021-05-09: 2 g via INTRAVENOUS
  Filled 2021-05-09: qty 50

## 2021-05-09 MED ORDER — MIDAZOLAM HCL 2 MG/2ML IJ SOLN
INTRAMUSCULAR | Status: DC | PRN
  Administered 2021-05-09: 1 mg via INTRAVENOUS

## 2021-05-09 MED ORDER — LABETALOL HCL 5 MG/ML IV SOLN
10.0000 mg | INTRAVENOUS | Status: DC | PRN
  Administered 2021-05-09: 10 mg via INTRAVENOUS
  Filled 2021-05-09: qty 4

## 2021-05-09 MED ORDER — HEPARIN SODIUM (PORCINE) 1000 UNIT/ML IJ SOLN
INTRAMUSCULAR | Status: DC | PRN
  Administered 2021-05-09: 2500 [IU] via INTRAVENOUS

## 2021-05-09 MED ORDER — SODIUM CHLORIDE 0.9 % WEIGHT BASED INFUSION: 1.0000 mL/kg/h | INTRAVENOUS | Status: DC

## 2021-05-09 MED ORDER — LABETALOL HCL 5 MG/ML IV SOLN
10.0000 mg | Freq: Once | INTRAVENOUS | Status: AC
  Administered 2021-05-09: 10 mg via INTRAVENOUS
  Filled 2021-05-09: qty 4

## 2021-05-09 MED ORDER — LACTATED RINGERS IV SOLN: INTRAVENOUS | Status: DC

## 2021-05-09 MED ORDER — VERAPAMIL HCL 2.5 MG/ML IV SOLN
INTRAVENOUS | Status: AC
  Filled 2021-05-09: qty 2

## 2021-05-09 MED ORDER — ATORVASTATIN CALCIUM 80 MG PO TABS
80.0000 mg | ORAL_TABLET | Freq: Every day | ORAL | Status: DC
  Administered 2021-05-09 – 2021-05-12 (×4): 80 mg via ORAL
  Filled 2021-05-09: qty 1
  Filled 2021-05-09: qty 2
  Filled 2021-05-09 (×2): qty 1

## 2021-05-09 MED ORDER — DIPHENHYDRAMINE HCL 50 MG/ML IJ SOLN
INTRAMUSCULAR | Status: AC
  Filled 2021-05-09: qty 1

## 2021-05-09 MED ORDER — ASPIRIN EC 81 MG PO TBEC
81.0000 mg | DELAYED_RELEASE_TABLET | Freq: Every day | ORAL | Status: DC
  Administered 2021-05-09 – 2021-05-12 (×4): 81 mg via ORAL
  Filled 2021-05-09 (×4): qty 1

## 2021-05-09 MED ORDER — SODIUM CHLORIDE 0.9% FLUSH: 3.0000 mL | INTRAVENOUS | Status: DC | PRN

## 2021-05-09 MED ORDER — MIDAZOLAM HCL 2 MG/2ML IJ SOLN
INTRAMUSCULAR | Status: AC
  Filled 2021-05-09: qty 2

## 2021-05-09 MED ORDER — SODIUM CHLORIDE 0.9 % WEIGHT BASED INFUSION
1.0000 mL/kg/h | INTRAVENOUS | Status: AC
  Administered 2021-05-09: 1 mL/kg/h via INTRAVENOUS

## 2021-05-09 MED ORDER — MOMETASONE FURO-FORMOTEROL FUM 100-5 MCG/ACT IN AERO
2.0000 | INHALATION_SPRAY | Freq: Two times a day (BID) | RESPIRATORY_TRACT | Status: DC
  Administered 2021-05-09 – 2021-05-12 (×7): 2 via RESPIRATORY_TRACT
  Filled 2021-05-09 (×2): qty 8.8

## 2021-05-09 MED ORDER — MEGESTROL ACETATE 40 MG PO TABS
40.0000 mg | ORAL_TABLET | Freq: Every day | ORAL | Status: DC | PRN
  Filled 2021-05-09: qty 1

## 2021-05-09 MED ORDER — NITROGLYCERIN 0.4 MG SL SUBL: 0.4000 mg | SUBLINGUAL_TABLET | SUBLINGUAL | Status: DC | PRN

## 2021-05-09 MED ORDER — HYDROMORPHONE HCL 1 MG/ML IJ SOLN
0.5000 mg | Freq: Once | INTRAMUSCULAR | Status: AC
  Administered 2021-05-09: 0.5 mg via INTRAVENOUS
  Filled 2021-05-09: qty 1

## 2021-05-09 MED ORDER — ALBUTEROL SULFATE (2.5 MG/3ML) 0.083% IN NEBU: 2.5000 mg | INHALATION_SOLUTION | Freq: Four times a day (QID) | RESPIRATORY_TRACT | Status: DC | PRN

## 2021-05-09 MED ORDER — SODIUM CHLORIDE 0.9% FLUSH
3.0000 mL | INTRAVENOUS | Status: DC | PRN
Start: 2021-05-10 — End: 2021-05-12

## 2021-05-09 MED ORDER — IOHEXOL 350 MG/ML SOLN
INTRAVENOUS | Status: DC | PRN
  Administered 2021-05-09: 10 mL

## 2021-05-09 MED ORDER — SODIUM CHLORIDE 0.9% FLUSH
3.0000 mL | Freq: Two times a day (BID) | INTRAVENOUS | Status: DC
  Administered 2021-05-09 – 2021-05-10 (×2): 3 mL via INTRAVENOUS

## 2021-05-09 MED ORDER — ACETAMINOPHEN 500 MG PO TABS: 1000.0000 mg | ORAL_TABLET | Freq: Three times a day (TID) | ORAL | Status: DC | PRN

## 2021-05-09 MED ORDER — LIDOCAINE HCL (PF) 1 % IJ SOLN
INTRAMUSCULAR | Status: DC | PRN
  Administered 2021-05-09: 2 mL

## 2021-05-09 MED ORDER — PROMETHAZINE HCL 25 MG PO TABS: 25.0000 mg | ORAL_TABLET | Freq: Every day | ORAL | Status: DC | PRN

## 2021-05-09 MED ORDER — MORPHINE SULFATE (PF) 2 MG/ML IV SOLN
2.0000 mg | INTRAVENOUS | Status: DC | PRN
  Administered 2021-05-09: 4 mg via INTRAVENOUS
  Administered 2021-05-09: 2 mg via INTRAVENOUS
  Administered 2021-05-09 – 2021-05-11 (×6): 4 mg via INTRAVENOUS
  Administered 2021-05-12: 2 mg via INTRAVENOUS
  Filled 2021-05-09: qty 1
  Filled 2021-05-09 (×10): qty 2

## 2021-05-09 MED ORDER — LIDOCAINE HCL (PF) 1 % IJ SOLN
INTRAMUSCULAR | Status: AC
  Filled 2021-05-09: qty 30

## 2021-05-09 MED ORDER — PANTOPRAZOLE SODIUM 40 MG PO TBEC
40.0000 mg | DELAYED_RELEASE_TABLET | Freq: Two times a day (BID) | ORAL | Status: DC
  Administered 2021-05-09 – 2021-05-12 (×7): 40 mg via ORAL
  Filled 2021-05-09 (×7): qty 1

## 2021-05-09 MED ORDER — AMLODIPINE BESYLATE 5 MG PO TABS
5.0000 mg | ORAL_TABLET | Freq: Every day | ORAL | Status: DC
  Administered 2021-05-10 – 2021-05-12 (×3): 5 mg via ORAL
  Filled 2021-05-09 (×3): qty 1

## 2021-05-09 MED ORDER — SODIUM CHLORIDE 0.9 % WEIGHT BASED INFUSION: 3.0000 mL/kg/h | INTRAVENOUS | Status: DC

## 2021-05-09 MED ORDER — HEPARIN (PORCINE) IN NACL 1000-0.9 UT/500ML-% IV SOLN
INTRAVENOUS | Status: DC | PRN
  Administered 2021-05-09 (×2): 500 mL

## 2021-05-09 MED ORDER — HEPARIN SODIUM (PORCINE) 1000 UNIT/ML IJ SOLN
INTRAMUSCULAR | Status: AC
  Filled 2021-05-09: qty 1

## 2021-05-09 MED ORDER — VERAPAMIL HCL 2.5 MG/ML IV SOLN
INTRAVENOUS | Status: DC | PRN
  Administered 2021-05-09: 5 mL via INTRA_ARTERIAL

## 2021-05-09 MED ORDER — HEPARIN (PORCINE) IN NACL 1000-0.9 UT/500ML-% IV SOLN
INTRAVENOUS | Status: AC
  Filled 2021-05-09: qty 1000

## 2021-05-09 MED ORDER — SODIUM CHLORIDE 0.9 % IV SOLN: 250.0000 mL | INTRAVENOUS | Status: DC | PRN

## 2021-05-09 MED ORDER — OXYCODONE-ACETAMINOPHEN 5-325 MG PO TABS
1.0000 | ORAL_TABLET | Freq: Four times a day (QID) | ORAL | Status: DC | PRN
  Administered 2021-05-09 – 2021-05-12 (×8): 1 via ORAL
  Filled 2021-05-09 (×8): qty 1

## 2021-05-09 MED ORDER — ISOSORBIDE MONONITRATE ER 30 MG PO TB24
30.0000 mg | ORAL_TABLET | Freq: Every day | ORAL | Status: DC
  Administered 2021-05-09 – 2021-05-12 (×4): 30 mg via ORAL
  Filled 2021-05-09 (×4): qty 1

## 2021-05-09 MED ORDER — LABETALOL HCL 100 MG PO TABS
100.0000 mg | ORAL_TABLET | Freq: Two times a day (BID) | ORAL | Status: DC
  Administered 2021-05-09 – 2021-05-12 (×7): 100 mg via ORAL
  Filled 2021-05-09 (×7): qty 1

## 2021-05-09 MED ORDER — CLOPIDOGREL BISULFATE 75 MG PO TABS
75.0000 mg | ORAL_TABLET | Freq: Every day | ORAL | Status: DC
  Administered 2021-05-09: 75 mg via ORAL
  Filled 2021-05-09: qty 1

## 2021-05-09 MED ORDER — LINACLOTIDE 72 MCG PO CAPS
72.0000 ug | ORAL_CAPSULE | Freq: Every day | ORAL | Status: DC
  Administered 2021-05-09 – 2021-05-12 (×4): 72 ug via ORAL
  Filled 2021-05-09 (×5): qty 1

## 2021-05-09 MED ORDER — SODIUM CHLORIDE 0.9% FLUSH
3.0000 mL | Freq: Two times a day (BID) | INTRAVENOUS | Status: DC
  Administered 2021-05-10 (×2): 3 mL via INTRAVENOUS

## 2021-05-09 MED ORDER — EZETIMIBE 10 MG PO TABS
10.0000 mg | ORAL_TABLET | Freq: Every day | ORAL | Status: DC
  Administered 2021-05-10 – 2021-05-12 (×3): 10 mg via ORAL
  Filled 2021-05-09 (×3): qty 1

## 2021-05-09 SURGICAL SUPPLY — 10 items
CATH OPTITORQUE TIG 4.0 5F (CATHETERS) ×1 IMPLANT
DEVICE RAD TR BAND REGULAR (VASCULAR PRODUCTS) ×1 IMPLANT
GLIDESHEATH SLEND A-KIT 6F 22G (SHEATH) ×1 IMPLANT
GUIDEWIRE INQWIRE 1.5J.035X260 (WIRE) IMPLANT
INQWIRE 1.5J .035X260CM (WIRE)
KIT HEART LEFT (KITS) ×2 IMPLANT
PACK CARDIAC CATHETERIZATION (CUSTOM PROCEDURE TRAY) ×2 IMPLANT
TRANSDUCER W/STOPCOCK (MISCELLANEOUS) ×2 IMPLANT
TUBING CIL FLEX 10 FLL-RA (TUBING) ×2 IMPLANT
WIRE HI TORQ VERSACORE-J 145CM (WIRE) ×1 IMPLANT

## 2021-05-09 NOTE — Progress Notes (Signed)
Patient's blood pressure is perfectly controlled on minimal doses of the medications, beginning to suspect whether she has missed doses of labetalol while at home.  Cardiac catheterization performed with minimal contrast, 10 mL utilized.  EDP was normal, which is low for the patient at 12 mmHg.  I have recommended 1 L of IV fluids bolus and continued fluids for the next additional 8 to 10 hours to decrease the risk of acute kidney injury from low perfusion.  Elevated troponins is secondary to demand ischemia.  We will continue to follow.  I have reduced the dose of amlodipine from 10 mg to 5 mg daily in view of soft blood pressure.   Adrian Prows, MD, [facility] 05/09/2021, 4:50 PM Office: [phone] Fax: [phone] Pager: [phone]

## 2021-05-09 NOTE — H&P (Signed)
CARDIOLOGY ADMIT NOTE   [name] MRN: [id number] DOB/AGE: 1961/03/31 60 y.o.  Admit date: 05/08/2021 Primary Physician:  Bernerd Limbo, MD  [name], female    DOB: 09/01/1961, 60 y.o.   MRN: [id number]  Chief Complaint  Patient presents with   Chest Pain   HPI:    [name]  is a 60 y.o. Caucasian female  with hypertension, COPD, diastolic CHF, ongoing tobacco abuse (2 cig a day), and difficult to control hypertension with CKD, with bilateral renal artery angioplasty and stenting in past and with recurrent restenosis. Last renal angioplasty for occluded left renal artery was unsuccessful  in Aug 2021. She also has  PAD with distal abdominal aorta stenosis S/P stenting with 10x 29 mm Omnilink Elite stent. Mesenteric ischemia and stenting of SMA, terminal aorta, bilateral common iliac arteries for chronic mesenteric ischemia on 11/2020 by vascular surgery.  Coronary angiogram in June 2019 for nitroglycerin responsive chest pain that revealed normal coronary arteries.    History of lower GI bleed found to have colonic polyposis 08/2020 requiring 2 units of packed RBCs.   Patient now admitted with chest pain and abdominal discomfort ongoing for the past 3 days, found to be hypertensive with markedly abnormal EKG and serial troponins revealing elevated enzymes.  Past Medical History:  Diagnosis Date   Anemia 2008   Anxiety    Aortic stenosis    Arthritis    "all my joints ache" (09/07/2014)   Asthma    Bipolar 1 disorder ([place])    Blood transfusion without reported diagnosis    Bradycardia    Bruit    Carpal tunnel syndrome    Cataract    forming   Cervical cancer ([place]) 1985   CHF (congestive heart failure) ([place])    Chronic kidney disease (CKD), stage V (HCC)    COPD (chronic obstructive pulmonary disease) ([place]) 2000   Coronary artery disease    Daily headache    Depression 2000   Diverticulitis 2008   Emphysema of lung (HCC)     GERD (gastroesophageal reflux disease)    Glaucoma    Heart murmur    History of colon polyps 2009   HLD (hyperlipidemia) 2013   Hypertension 2013   Hypovitaminosis D    IBS (irritable bowel syndrome) 2008   Myocardial infarction [facility])    2015   PAD (peripheral artery disease) ([place])    Pancreatitis 10/2011   Pneumonia 07/2014   RLS (restless legs syndrome)    Schizophrenia (HCC)    Shortness of breath    Skin cancer    Small bowel obstruction ([place]) 2008   Thyroid disease    Past Surgical History:  Procedure Laterality Date   ABDOMINAL ANGIOGRAM N/A 09/07/2014   Procedure: ABDOMINAL ANGIOGRAM;  Surgeon: Laverda Page, MD;  Location: [facility] CATH LAB;  Service: Cardiovascular;  Laterality: N/A;   ABDOMINAL AORTOGRAM W/LOWER EXTREMITY N/A 12/28/2020   Procedure: ABDOMINAL AORTOGRAM W/LOWER EXTREMITY;  Surgeon: Cherre Robins, MD;  Location: [place] [facility];  Service: Cardiovascular;  Laterality: N/A;   APPENDECTOMY  10/01/1994   BIOPSY  08/06/2020   Procedure: BIOPSY;  Surgeon: Jackquline Denmark, MD;  Location: [facility] ENDOSCOPY;  Service: Endoscopy;;   COLON SURGERY  10/01/2006   6 inches of colon removed due to obstruction   COLONOSCOPY WITH PROPOFOL N/A 10/25/2016   Procedure: COLONOSCOPY WITH PROPOFOL;  Surgeon: Milus Banister, MD;  Location: [facility] ENDOSCOPY;  Service: Endoscopy;  Laterality:  N/A;   COLONOSCOPY WITH PROPOFOL N/A 08/07/2020   Procedure: COLONOSCOPY WITH PROPOFOL;  Surgeon: Jackquline Denmark, MD;  Location: [facility] ENDOSCOPY;  Service: Endoscopy;  Laterality: N/A;   COLONOSCOPY WITH PROPOFOL N/A 08/27/2020   Procedure: COLONOSCOPY WITH PROPOFOL;  Surgeon: Lavena Bullion, DO;  Location: [facility] ENDOSCOPY;  Service: Gastroenterology;  Laterality: N/A;   CORONARY ANGIOPLASTY WITH STENT PLACEMENT  09/07/2014   "2"   ENTEROSCOPY N/A 08/27/2020   Procedure: ENTEROSCOPY;  Surgeon: Lavena Bullion, DO;  Location: [facility] ENDOSCOPY;  Service: Gastroenterology;  Laterality: N/A;  Push  enteroscopy    ESOPHAGOGASTRODUODENOSCOPY (EGD) WITH PROPOFOL N/A 08/06/2020   Procedure: ESOPHAGOGASTRODUODENOSCOPY (EGD) WITH PROPOFOL;  Surgeon: Jackquline Denmark, MD;  Location: [facility];  Service: Endoscopy;  Laterality: N/A;   GIVENS CAPSULE STUDY N/A 08/24/2020   Procedure: GIVENS CAPSULE STUDY;  Surgeon: Lavena Bullion, DO;  Location: [facility] ENDOS[facility];  Service: Gastroenterology;  Laterality: N/A;   HOT HEMOSTASIS N/A 08/07/2020   Procedure: HOT HEMOSTASIS (ARGON PLASMA COAGULATION/BICAP);  Surgeon: Jackquline Denmark, MD;  Location: [facility];  Service: Endoscopy;  Laterality: N/A;   HOT HEMOSTASIS N/A 08/27/2020   Procedure: HOT HEMOSTASIS (ARGON PLASMA COAGULATION/BICAP);  Surgeon: Lavena Bullion, DO;  [facility]: [facility];  Service: Gastroenterology;  Laterality: N/A;   LEFT HEART CATH AND CORONARY ANGIOGRAPHY N/A 03/25/2018   Procedure: LEFT HEART CATH AND CORONARY ANGIOGRAPHY;  Surgeon: Nigel Mormon, MD;  Location: [place] [facility];  Service: Cardiovascular;  Laterality: N/A;   LEFT HEART CATHETERIZATION WITH CORONARY ANGIOGRAM N/A 09/07/2014   Procedure: LEFT HEART CATHETERIZATION WITH CORONARY ANGIOGRAM;  Surgeon: Laverda Page, MD;  Location: [facility] CATH LAB;  Service: Cardiovascular;  Laterality: N/A;   LOWER EXTREMITY ANGIOGRAPHY  10/29/2017   Procedure: Lower Extremity Angiography;  Surgeon: Adrian Prows, MD;  Location: [place] [facility] LAB;  Service: Cardiovascular;;   PERIPHERAL VASCULAR CATHETERIZATION N/A 11/01/2015   Procedure: Renal Angiography;  Surgeon: Adrian Prows, MD;  Location: [place] [facility] LAB;  Service: Cardiovascular;  Laterality: N/A;   PERIPHERAL VASCULAR CATHETERIZATION N/A 04/10/2016   Procedure: Renal Angiography;  Surgeon: Adrian Prows, MD;  Location: [place] [facility];  Service: Cardiovascular;  Laterality: N/A;   PERIPHERAL VASCULAR CATHETERIZATION  04/10/2016   Procedure: Peripheral Vascular Intervention;  Surgeon: Adrian Prows, MD;  Location: [place] [facility] LAB;  Service: Cardiovascular;;   PERIPHERAL VASCULAR INTERVENTION  10/29/2017   Procedure: PERIPHERAL VASCULAR INTERVENTION;  Surgeon: Adrian Prows, MD;  Location: [place] [facility];  Service: Cardiovascular;;   PERIPHERAL VASCULAR INTERVENTION Bilateral 12/28/2020   Procedure: PERIPHERAL VASCULAR INTERVENTION;  Surgeon: Cherre Robins, MD;  Location: [place] [facility];  Service: Cardiovascular;  Laterality: Bilateral;   POLYPECTOMY  08/07/2020   Procedure: POLYPECTOMY;  Surgeon: Jackquline Denmark, MD;  [facility];  Service: Endoscopy;;   POLYPECTOMY  08/27/2020   Procedure: POLYPECTOMY;  Surgeon: Lavena Bullion, DO;  Location: [facility];  Service: Gastroenterology;;   POLYPECTOMY     RENAL ANGIOGRAPHY N/A 10/29/2017   Procedure: RENAL ANGIOGRAPHY;  Surgeon: Adrian Prows, MD;  Location: [place] [facility];  Service: Cardiovascular;  Laterality: N/A;   RENAL ANGIOGRAPHY N/A 05/10/2020   Procedure: RENAL ANGIOGRAPHY;  Surgeon: Nigel Mormon, MD;  Location: [place] [facility];  Service: Cardiovascular;  Laterality: N/A;   RIGHT OOPHORECTOMY Right 10/01/1994   TOTAL ABDOMINAL HYSTERECTOMY  10/02/1995   UPPER GASTROINTESTINAL ENDOSCOPY     Social History   Socioeconomic History   Marital status: Divorced  Spouse name: Not on file   Number of children: 1   Years of education: Not on file   Highest education level: Not on file  Occupational History   Occupation: Scientist, research (life sciences): UNEMPLOYED  Tobacco Use   Smoking status: Some Days    Packs/day: 1.50    Years: 40.00    Pack years: 60.00    Types: Cigarettes   Smokeless tobacco: Never  Vaping Use   Vaping Use: Former  Substance and Sexual Activity   Alcohol use: No   Drug use: Yes    Frequency: 3.0 times per week    Types: Marijuana    Comment: last use yesterday   Sexual activity: Yes    Birth control/protection: Post-menopausal  Other Topics Concern   Not on file  Social  History Narrative   Not on file   Social Determinants of Health   Financial Resource Strain: Not on file  Food Insecurity: Not on file  Transportation Needs: Not on file  Physical Activity: Not on file  Stress: Not on file  Social Connections: Not on file  Intimate Partner Violence: Not on file   Family History  Problem Relation Age of Onset   Other Mother        many bowel obstructions   Heart disease Mother    Colon polyps Mother    Kidney cancer Father    Bone cancer Father    Diabetes Father    Heart disease Father    Heart disease Brother    Colon cancer Paternal Grandfather    Diabetes Daughter    Esophageal cancer Neg Hx    Rectal cancer Neg Hx    Stomach cancer Neg Hx     ROS  Review of Systems  Constitutional: Positive for malaise/fatigue.  Cardiovascular:  Positive for chest pain.  Respiratory:  Positive for cough, shortness of breath and wheezing. Negative for hemoptysis.   Musculoskeletal:  Positive for back pain.  Gastrointestinal:  Positive for bloating and abdominal pain. Negative for hematochezia, hemorrhoids and melena.  Psychiatric/Behavioral: Negative.    All other systems reviewed and are negative. Objective   Vitals with BMI 05/09/2021 05/09/2021 05/09/2021  Height - - -  Weight - - -  BMI - - -  Systolic 509 326 712  Diastolic 458 099 833  Pulse 95 91 92    Physical Exam Constitutional:      Appearance: She is underweight. She is ill-appearing.  Neck:     Vascular: No carotid bruit or JVD.  Cardiovascular:     Rate and Rhythm: Normal rate and regular rhythm.     Pulses: Normal pulses and intact distal pulses.     Heart sounds: Normal heart sounds. No murmur heard.   No gallop.  Pulmonary:     Effort: Pulmonary effort is normal.     Breath sounds: Normal breath sounds.  Abdominal:     General: Bowel sounds are normal.     Palpations: Abdomen is soft.     Tenderness: There is abdominal tenderness (Epigastric and right hypochondrial region  mild tenderness. NO guarding).  Musculoskeletal:        General: Normal range of motion.  Skin:    Capillary Refill: Capillary refill takes less than 2 seconds.  Neurological:     General: No focal deficit present.     Mental Status: She is alert and oriented to person, place, and time.   Laboratory examination:    Recent Labs  05/10/20 0809 05/11/20 0617 06/24/20 1646 08/04/20 2211 12/29/20 0138 12/30/20 0738 05/08/21 2208  NA 138 137 141   < > 135 136 133*  K 4.2 4.8 5.5*   < > 5.5* 4.4 3.8  CL 101 102 108*   < > 105 111 98  CO2 23 26 18*   < > 23 22 21*  GLUCOSE 108* 104* 116*   < > 134* 92 160*  BUN 15 20 27*   < > 24* 30* 25*  CREATININE 1.92* 2.09* 2.41*   < > 2.84* 2.63* 2.86*  CALCIUM 9.7 9.5 9.0   < > 9.1 7.8* 9.6  GFRNONAA 28* 26* 22*   < > 19* 20* 19*  GFRAA 33* 30* 25*  --   --   --   --    < > = values in this interval not displayed.   estimated creatinine clearance is 13.7 mL/min (A) (by C-G formula based on SCr of 2.86 mg/dL (H)).  CMP Latest Ref Rng & Units 05/08/2021 12/30/2020 12/29/2020  Glucose 70 - 99 mg/dL 160(H) 92 134(H)  BUN 6 - 20 mg/dL 25(H) 30(H) 24(H)  Creatinine 0.44 - 1.00 mg/dL 2.86(H) 2.63(H) 2.84(H)  Sodium 135 - 145 mmol/L 133(L) 136 135  Potassium 3.5 - 5.1 mmol/L 3.8 4.4 5.5(H)  Chloride 98 - 111 mmol/L 98 111 105  CO2 22 - 32 mmol/L 21(L) 22 23  Calcium 8.9 - 10.3 mg/dL 9.6 7.8(L) 9.1  Total Protein 6.5 - 8.1 g/dL 6.9 - -  Total Bilirubin 0.3 - 1.2 mg/dL 0.2(L) - -  Alkaline Phos 38 - 126 U/L 143(H) - -  AST 15 - 41 U/L 21 - -  ALT 0 - 44 U/L 13 - -   CBC Latest Ref Rng & Units 05/08/2021 02/09/2021 12/28/2020  WBC 4.0 - 10.5 K/uL 7.6 9.3 9.3  Hemoglobin 12.0 - 15.0 g/dL 11.8(L) 11.2(L) 12.9  Hematocrit 36.0 - 46.0 % 35.0(L) 32.9(L) 39.1  Platelets 150 - 400 K/uL 461(H) 446.0(H) 384   Lipid Panel     Component Value Date/Time   CHOL 119 03/31/2020 1336   TRIG 92 03/31/2020 1336   HDL 50 03/31/2020 1336   CHOLHDL 2.5  10/14/2019 1408   CHOLHDL 3.7 10/08/2012 0228   VLDL 30 10/08/2012 0228   LDLCALC 51 03/31/2020 1336   HEMOGLOBIN A1C No results found for: HGBA1C, MPG TSH No results for input(s): TSH in the last 8760 hours. BNP (last 3 results) No results for input(s): BNP in the last 8760 hours. Cardiac Panel (last 3 results) Recent Labs    05/08/21 2208 05/08/21 2359  TROPONINIHS 37* 77*     Medications and allergies   Allergies  Allergen Reactions   Doxycycline Anaphylaxis and Hives   Hydralazine Rash   Hydrocodone-Acetaminophen Nausea And Vomiting   Iohexol Itching    Pt has itching nose after iv contrast injection     promethazine (PHENERGAN) injection (IM or IVPB) Stopped (05/09/21 0020)    Current Outpatient Medications  Medication Instructions   acetaminophen (TYLENOL) 1,000 mg, Oral, Every 8 hours PRN   albuterol (PROVENTIL HFA;VENTOLIN HFA) 108 (90 BASE) MCG/ACT inhaler 2 puffs, Inhalation, Every 6 hours PRN   ALPRAZolam (XANAX) 1 MG tablet 0.5 tablets, Oral, 2 times daily PRN   amLODipine (NORVASC) 10 MG tablet TAKE 1 TABLET BY MOUTH ONCE DAILY   aspirin 81 mg, Oral, Daily, Swallow whole.   atorvastatin (LIPITOR) 80 mg, Oral, Daily   azelastine (ASTELIN) 0.1 % nasal  spray 1 spray, Each Nare, Daily PRN   clopidogrel (PLAVIX) 75 MG tablet TAKE 1 TABLET BY MOUTH ONCE A DAY   ezetimibe (ZETIA) 10 MG tablet TAKE 1 TABLET BY MOUTH ONCE A DAY   isosorbide mononitrate (IMDUR) 60 mg, Oral, Daily   labetalol (NORMODYNE) 100 mg, Oral, 2 times daily   linaclotide (LINZESS) 72 mcg, Oral, Daily before breakfast   megestrol (MEGACE) 40 MG tablet 1 tablet, Oral, Daily PRN   mometasone-formoterol (DULERA) 100-5 MCG/ACT AERO Take 2 puffs first thing in am and then another 2 puffs about 12 hours later.   nitroGLYCERIN (NITROSTAT) 0.4 mg, Sublingual, Every 5 min PRN   oxyCODONE-acetaminophen (PERCOCET/ROXICET) 5-325 MG tablet 1 tablet, Oral, Every 6 hours PRN   pantoprazole (PROTONIX) 40 MG  tablet TAKE 1 TABLET BY MOUTH TWICE (2) DAILY   promethazine (PHENERGAN) 25 mg, Oral, Daily PRN   venlafaxine XR (EFFEXOR XR) 150 mg, Oral, Daily with breakfast    No intake/output data recorded. Total I/O In: 44.6 [IV Piggyback:44.6] Out: -     Radiology:  CT ABDOMEN PELVIS WO CONTRAST  Result Date: 05/09/2021 CLINICAL DATA:  Abdominal pain EXAM: CT ABDOMEN AND PELVIS WITHOUT CONTRAST TECHNIQUE: Multidetector CT imaging of the abdomen and pelvis was performed following the standard protocol without IV contrast. COMPARISON:  08/24/2020 FINDINGS: Lower chest: Emphysematous changes are noted in the lung bases. A somewhat triangular density is noted in the left lower lobe laterally stable in appearance from the prior exam. Hepatobiliary: No focal liver abnormality is seen. No gallstones, gallbladder wall thickening, or biliary dilatation. Pancreas: Unremarkable. No pancreatic ductal dilatation or surrounding inflammatory changes. Spleen: Normal in size without focal abnormality. Adrenals/Urinary Tract: Adrenal glands are within normal limits. Left kidney is somewhat shrunken when compared with the prior exam. The right kidney appears within normal limits. No renal calculi or obstructive changes are seen. Bladder is partially distended. Stomach/Bowel: No obstructive or inflammatory changes of the colon are seen. Appendix has been surgically removed. Small bowel and stomach are within normal limits. Vascular/Lymphatic: Heavy atherosclerotic calcifications of the abdominal aorta are noted. Bi iliac stenting is noted. No sizable adenopathy is seen. Reproductive: Status post hysterectomy. No adnexal masses. Other: No abdominal wall hernia or abnormality. No abdominopelvic ascites. Musculoskeletal: No acute or significant osseous findings. IMPRESSION: No acute abnormality noted. Slight decrease in size of the left kidney when compared with the prior exam. This is likely related to renal artery stenosis. Stable  nodule in the left lung base dating back to 2013. No further follow-up is recommended. Aortic Atherosclerosis (ICD10-I70.0) and Emphysema (ICD10-J43.9). Electronically Signed   By: Inez Catalina M.D.   On: 05/09/2021 02:22   DG Chest 2 View  Result Date: 05/08/2021 CLINICAL DATA:  CHEST PAIN EXAM: CHEST - 2 VIEW COMPARISON:  Radiograph 06/05/2017 FINDINGS: Chronic hyperinflation and coarsened interstitial changes are similar to comparison radiographs. No new consolidative process. No pneumothorax or visible layering effusion. Stable cardiomediastinal contours with a calcified aorta. No acute osseous or soft tissue abnormality. Telemetry leads overlie the chest. IMPRESSION: No acute cardiopulmonary abnormality. Chronic hyperinflation, coarsened interstitial changes. Electronically Signed   By: Lovena Le M.D.   On: 05/08/2021 22:37   VAS US RENAL ARTERY DUPLEX  Result Date: 05/09/2021 ABDOMINAL VISCERAL Patient Name:  [name]  Date of Exam:   05/09/2021 Medical Rec #: [id number]         Accession #:    [id number] Date of Birth: 12-21-1960  Patient Gender: F Patient Age:   17 years Exam Location:  [facility] Procedure:      VAS US RENAL ARTERY DUPLEX Referring Phys: [id number] JARED M GARDNER -------------------------------------------------------------------------------- Indications: Hypertesion, abdominal pain, history of bilateral renal artery              stents, SMA stent, and bilateral common iliac artery stent. High Risk Factors: Hypertension, hyperlipidemia. Other Factors: CKD stage 4, chronic mesenteric ischemia. Comparison Study: 04-11-2021 Mesenteric complete showed patent SMA with no                   evidence of stenosis.                    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                     Right common iliac stenosis stented. Post stenting perforation                   with extravasation in the RCIA as well as ulceration in the                   aortic stent. Elected to cover with terminal aortic stenting                   and bilateral common iliac artery stenting.                    06-15-2020 PCV Renal/Renal Artery Duplex showed no evidence of                   renal artery occlusive disease in eitehr renal artery. Mild                   increase in right vascular RI. Normal intrarenal vascular                   perfusion noted in the left kidney. Performing Technologist: Darlin Coco RDMS,RVT  Examination Guidelines: A complete evaluation includes B-mode imaging, spectral Doppler, color Doppler, and power Doppler as needed of all accessible portions of each vessel. Bilateral testing is considered an integral part of a complete examination. Limited examinations for reoccurring indications may be performed as noted.  Duplex Findings: +--------------------+--------+--------+------+----------------+ Mesenteric          PSV cm/sEDV cm/sPlaque    Comments     +--------------------+--------+--------+------+----------------+ Aorta Mid             127                                  +--------------------+--------+--------+------+----------------+ Celiac Artery Origin  220                                  +--------------------+--------+--------+------+----------------+ SMA Origin            179  46                          +--------------------+--------+--------+------+----------------+ SMA Proximal          220      59         Patent SMA stent +--------------------+--------+--------+------+----------------+ SMA Mid               149      49                          +--------------------+--------+--------+------+----------------+ SMA Distal             94      33                           +--------------------+--------+--------+------+----------------+    +------------------+--------+--------+-------+ Right Renal ArteryPSV cm/sEDV cm/sComment +------------------+--------+--------+-------+ Origin               86      24           +------------------+--------+--------+-------+ Proximal            104      26           +------------------+--------+--------+-------+ Mid                  86      28           +------------------+--------+--------+-------+ Distal              123      38           +------------------+--------+--------+-------+ +-----------------+--------+--------+--------+ Left Renal ArteryPSV cm/sEDV cm/sComment  +-----------------+--------+--------+--------+ Origin              0       0    Occluded +-----------------+--------+--------+--------+ Proximal            0       0    Occluded +-----------------+--------+--------+--------+ Mid                 0       0    Occluded +-----------------+--------+--------+--------+ Distal              0       0    Occluded +-----------------+--------+--------+--------+ +------------+--------+--------+----+-----------+--------+--------+---+ Right KidneyPSV cm/sEDV cm/sRI  Left KidneyPSV cm/sEDV cm/sRI  +------------+--------+--------+----+-----------+--------+--------+---+ Upper Pole  21      7       0.67Upper Pole                     +------------+--------+--------+----+-----------+--------+--------+---+ Mid         33      10      0.70Mid        0       0           +------------+--------+--------+----+-----------+--------+--------+---+ Lower Pole  20      7       0.66Lower Pole                     +------------+--------+--------+----+-----------+--------+--------+---+ Hilar       117     34      0.71Hilar      0       0           +------------+--------+--------+----+-----------+--------+--------+---+  +------------------+-----+------------------+--------------------+ Right Kidney  Left Kidney                            +------------------+-----+------------------+--------------------+ RAR                    RAR                                    +------------------+-----+------------------+--------------------+ RAR (manual)      0.9  RAR (manual)      LRA appears occluded +------------------+-----+------------------+--------------------+ Cortex                 Cortex                                 +------------------+-----+------------------+--------------------+ Cortex thickness       Corex thickness                        +------------------+-----+------------------+--------------------+ Kidney length (cm)10.23Kidney length (cm)6.65                 +------------------+-----+------------------+--------------------+  Summary: Renal:  Right: Normal size right kidney. Mildly elevated RI No evidence of        right renal artery stenosis. RRV flow present. Left:  Left renal artery appears occluded. No perfusion observed to        left kidney. Abnormal size for the left kidney. Abnormal        cortical thickness of the left kidney. Atrophic appearance. Mesenteric:  Stented SMA appears patent. Velocities appear essentially unchanged compared to previous examination on 04-11-2021. 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  *See table(s) above for measurements and observations.     Preliminary      Cardiac Studies:    Abdominal aortogram 10/29/2017: Widely patent renal arteries. Distal abdominal aorta diffuse disease, calcification with 80% stenosis S/P 10 x 29 mm Omnilink Elite stent, 80% to 0%.    Lexiscan myoview stress test 02/17/2018:  1. Lexiscan stress test was performed. Exercise capacity was not assessed. Stress symptoms included dizziness, nausea, headache, and chest pressure.. Peak blood pressure was 176/88  mmHg. Stress EKG is non diagnostic for ischemia as it is a pharmacologic stress. In addition, it showed The stress electrocardiogram showed sinus tachycardia, normal stress conduction, left ventricular hypertrophy, no stress arrhythmias and normal stress repolarization. 2. The overall quality of the study is excellent. Left ventricular cavity is noted to be normal on the rest and stress studies. Gated SPECT images reveal normal myocardial thickening and wall motion. The left ventricular ejection fraction was calculated or visually estimated to be 78%. SPECT images demonstrate small perfusion abnormality of moderate intensity in the mid anterolateral and apical lateral myocardial wall(s) on the stress images, reversible on rest images. Findings suggest small area of moderate intensity ischemia in mid to apical anterolateral myocardium. Intermediate risk study.   Cardiac Cath 03/25/2018: Normal coronary arteries.   Echocardiogram 10/16/2019: Normal LV systolic function with EF 60%. Left ventricle cavity is normal in size. Normal global wall motion. Normal diastolic filling pattern. Calculated EF 60%. Left atrial cavity is mildly dilated in 4 chamber views. Structurally normal mitral valve.  Mild (Grade I) mitral regurgitation. Structurally normal tricuspid valve.  Mild tricuspid regurgitation. No evidence of pulmonary hypertension. Compared to 02/25/2017, no significant change.  Renal angiogram 05/10/2020: Patent Rt renal stent (placed 2015,  6.0 x 15 mm Herculink) 100% restenosis of Lt renals stent with flush occlusion 6.0 x 18 mm Herculink)   Successful angioplasty Lt renal stent Antegrade dissection in distal Lt renal artery with decreased distal flow, too distal for stenting. There is blush in left kidney. She will be monitored for 6-8 hrs for any signs of renal infarct.   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 Stenosis in the  right  external carotid artery (<50%).  Stenosis in the left internal carotid artery (16-49%). Stenosis in the  left external carotid artery (<50%).  Antegrade right vertebral artery flow. Antegrade left vertebral artery  flow.  Follow up in 12 months is appropriate if clinically indicated.  Compared to the study done on 04/08/2020, left ICA stenosis was in the  range of 50-69%., now <50%.   Mesenteric Duplex 12/20/2020: :  Velocities suggest 70 to 99% stenosis in the superior mesenteric artery and >50% stenosis in the distal aorta. Extensive plaque was noted throughout abdominal aorta.   Abdominal aortogram with lower extremity and peripheral vascular intervention 12/28/2020: Aortogram reveals prior terminal aortic stenting prior renal stenting CO2 aortography performed for majority of diagnostic studies 70% SMA stenosis. 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 95% right common iliac stenosis Unremarkable SMA stenting. Unable to track a stent into the celiac artery.  I elected to not continue because of the risk of contrast nephropathy. Right common iliac stenosis stented.  Post stenting we noted perforation with extravasation in the right common iliac artery as well as ulceration in the aortic stent. I elected to cover all of these lesions with terminal aortic stenting and bilateral common iliac artery stenting. Angiogram shows excellent flow through the terminal aorta and through the aortic bifurcation.   PLAN: Successful SMA revascularization.  Unfortunately required aorto and bilateral common iliac artery stenting.  Admit 4E. Needs dual antiplatelet therapy and high intensity statin going forward.  Continue IV hydration.  Check basic metabolic panel in the morning.  Give diet and see how she tolerates food.   Mesenteric duplex 01/16/2021: SMA stent not well visualized, however SMA is patent with no evidence of stenosis. Aortoiliac stent is patent with no evidence of stenosis. >50% stenosis is noted distal to the  right CIA stent.  EKG:  EKG 05/08/2021: Sinus tachycardia at rate of 122 bpm, normal axis, 2 mm downsloping inferior and lateral ST segment depression consistent with subendocardial ischemia versus subendocardial infarct.  Voltage criteria for LVH.  Compared to prior EKG 05/12/2021, ST changes are new.   Assessment  [name] is a 60 y.o. Caucasian female  with hypertension, COPD, diastolic CHF, ongoing tobacco abuse (2 cig a day), and difficult to control hypertension with CKD, with bilateral renal artery angioplasty and stenting in past and with recurrent restenosis. Last renal angioplasty for occluded left renal artery was unsuccessful  in Aug 2021. She also has  PAD with distal abdominal aorta stenosis S/P stenting with 10x 29 mm Omnilink Elite stent. Mesenteric ischemia and stenting of SMA, terminal aorta, bilateral common iliac arteries for chronic mesenteric ischemia on 11/2020 by vascular surgery.  Coronary angiogram in June 2019 for nitroglycerin responsive chest pain that revealed normal coronary arteries.    History of lower GI bleed found to have colonic polyposis 08/2020 requiring 2 units of packed RBCs.   Patient now admitted with chest pain and abdominal discomfort ongoing for the past 3 days, found to  be hypertensive with markedly abnormal EKG and serial troponins revealing elevated enzymes.  1.  NSTEMI, significant EKG abnormality with ST depression in the inferolateral leads that is new. 2.  Abdominal discomfort with elevated alkaline phosphatase, diffuse mild tenderness and tenderness worse at the epigastrium and also right hypochondrium.  Patient has not had recent GI bleed or melena, last time she had dark stools was a month ago.  Has history of colonic polyposis.  History of mesenteric ischemia.  Weight has been relatively stable over the past 1 to 2 months, may be -1 pound. 3.  Peripheral arterial disease 4.  Resistant hypertension and renovascular hypertension.  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 5.  Stage IV chronic kidney disease related to hypertension and also loss of kidney from renovascular hypertension. 6.  Tobacco use disorde  Recommendations:   Hemoglobin has remained stable.  Abdominal discomfort may be related to colonic polyposis or peptic ulcer disease as she has epigastric tenderness.  She may need GI evaluation.  She is presently on dual antiplatelet therapy since mesenteric angioplasty and repeat distal abdominal aortic stenting and bilateral iliac artery stenting performed in March 2022.  However in view of ongoing chest pain although it is improved since being in the hospital, elevated troponin, would recommend hydration and cardiac catheterization late this afternoon for definitive diagnosis of coronary disease.  If no significant disease, the EKG abnormality could be demand ischemia from hypertensive urgency.  It is unfortunate that she still continues to smoke.  She may need GI evaluation as well if she continues to have abdominal discomfort.  We will follow.  Schedule for cardiac catheterization, and possible angioplasty. We discussed regarding risks, benefits, alternatives to this including stress testing, CTA and continued medical therapy. Patient wants to proceed. Understands <1-2% risk of death, stroke, MI, urgent CABG, bleeding, infection, renal failure but not limited to these.    Adrian Prows, MD, [facility] 05/09/2021, 11:07 AM Office: [phone] Fax: [phone] Pager: [phone]

## 2021-05-09 NOTE — H&P (Signed)
History and Physical    [name] YIR:[id number] DOB: 31-Jan-1961 DOA: 05/08/2021  PCP: Bernerd Limbo, MD  Patient coming from: Home  I have personally briefly reviewed patient's old medical records in [place]  Chief Complaint: CP  HPI: [name] is a 60 y.o. female with medical history significant of malignant renovascular HTN, RAS, CKD 4, chronic mesenteric ischemia, PAD, stenosis of distal aorta, COPD in continuous smoker, schizophrenia.  Throughout the years her vascular interventions have included B renal artery stents, SMA stent, distal aorta stent.  As well as various re angioplasty procedures when she has had in-stent restenosis of the above stents (see [place] for a more extensive list).  Most recent procedure was 12/28/2020 and included SMA stenting, terminal aortic stenting, and B common iliac artery stenting, done by vascular surgery.  Interestingly, despite above, she does not have a known h/o CAD, with LHC in 2019 showing angiographically normal coronaries.  Pt has had abd pain for the past 3 weeks, associated with intermittent N/V, sometimes periumbilical and sometimes generalized.  Pt presents to ED today though after onset of substernal CP last evening.  CP continued throughout the day today so finally called EMS.  3 NTG with relief of CP.  Reports SOB that remains unchanged with resolution of CP.  No fevers, melena, hematemesis.   ED Course: Pt tachycardic and hypertensive on arrival with BP 229/115, s.tach as high as 135.  Currently 180/104 after 10mg  labetalol IV (HR 100).  Trops 37 and 77.  Lactate 0.8.  Creat 2.86 (about baseline).   Review of Systems: As per HPI, otherwise all review of systems negative.  Past Medical History:  Diagnosis Date   Anemia 2008   Anxiety    Aortic stenosis    Arthritis    "all my joints ache" (09/07/2014)   Asthma    Bipolar 1 disorder ([place])    Blood transfusion without reported diagnosis    Bradycardia     Bruit    Carpal tunnel syndrome    Cataract    forming   Cervical cancer ([place]) 1985   CHF (congestive heart failure) ([place])    Chronic kidney disease (CKD), stage V (HCC)    COPD (chronic obstructive pulmonary disease) ([place]) 2000   Coronary artery disease    Daily headache    Depression 2000   Diverticulitis 2008   Emphysema of lung (HCC)    GERD (gastroesophageal reflux disease)    Glaucoma    Heart murmur    History of colon polyps 2009   HLD (hyperlipidemia) 2013   Hypertension 2013   Hypovitaminosis D    IBS (irritable bowel syndrome) 2008   Myocardial infarction [facility])    2015   PAD (peripheral artery disease) ([place])    Pancreatitis 10/2011   Pneumonia 07/2014   RLS (restless legs syndrome)    Schizophrenia (HCC)    Shortness of breath    Skin cancer    Small bowel obstruction ([place]) 2008   Thyroid disease     Past Surgical History:  Procedure Laterality Date   ABDOMINAL ANGIOGRAM N/A 09/07/2014   Procedure: ABDOMINAL ANGIOGRAM;  Surgeon: Laverda Page, MD;  Location: [facility] CATH LAB;  Service: Cardiovascular;  Laterality: N/A;   ABDOMINAL AORTOGRAM W/LOWER EXTREMITY N/A 12/28/2020   Procedure: ABDOMINAL AORTOGRAM W/LOWER EXTREMITY;  Surgeon: Cherre Robins, MD;  Location: [place] CV LAB;  Service: Cardiovascular;  Laterality: N/A;   APPENDECTOMY  10/01/1994   BIOPSY  08/06/2020  Procedure: BIOPSY;  Surgeon: Jackquline Denmark, MD;  Location: [facility] ENDOSCOPY;  Service: Endoscopy;;   COLON SURGERY  10/01/2006   6 inches of colon removed due to obstruction   COLONOSCOPY WITH PROPOFOL N/A 10/25/2016   Procedure: COLONOSCOPY WITH PROPOFOL;  Surgeon: Milus Banister, MD;  Location: [facility] ENDOSCOPY;  Service: Endoscopy;  Laterality: N/A;   COLONOSCOPY WITH PROPOFOL N/A 08/07/2020   Procedure: COLONOSCOPY WITH PROPOFOL;  Surgeon: Jackquline Denmark, MD;  Location: [facility] ENDOSCOPY;  Service: Endoscopy;  Laterality: N/A;   COLONOSCOPY WITH PROPOFOL N/A 08/27/2020   Procedure:  COLONOSCOPY WITH PROPOFOL;  Surgeon: Lavena Bullion, DO;  Location: [facility] ENDOSCOPY;  Service: Gastroenterology;  Laterality: N/A;   CORONARY ANGIOPLASTY WITH STENT PLACEMENT  09/07/2014   "2"   ENTEROSCOPY N/A 08/27/2020   Procedure: ENTEROSCOPY;  Surgeon: Lavena Bullion, DO;  Location: [facility] ENDOSCOPY;  Service: Gastroenterology;  Laterality: N/A;  Push enteroscopy    ESOPHAGOGASTRODUODENOSCOPY (EGD) WITH PROPOFOL N/A 08/06/2020   Procedure: ESOPHAGOGASTRODUODENOSCOPY (EGD) WITH PROPOFOL;  Surgeon: Jackquline Denmark, MD;  [facility] ENDOS[facility];  Service: Endoscopy;  Laterality: N/A;   GIVENS CAPSULE STUDY N/A 08/24/2020   Procedure: GIVENS CAPSULE STUDY;  Surgeon: Lavena Bullion, DO;  [facility]: [facility] ENDOSCOPY;  Service: Gastroenterology;  Laterality: N/A;   HOT HEMOSTASIS N/A 08/07/2020   Procedure: HOT HEMOSTASIS (ARGON PLASMA COAGULATION/BICAP);  Surgeon: Jackquline Denmark, MD;  Location: [facility];  Service: Endoscopy;  Laterality: N/A;   HOT HEMOSTASIS N/A 08/27/2020   Procedure: HOT HEMOSTASIS (ARGON PLASMA COAGULATION/BICAP);  Surgeon: Lavena Bullion, DO;  [facility];  Service: Gastroenterology;  Laterality: N/A;   LEFT HEART CATH AND CORONARY ANGIOGRAPHY N/A 03/25/2018   Procedure: LEFT HEART CATH AND CORONARY ANGIOGRAPHY;  Surgeon: Nigel Mormon, MD;  Location: [place] CV LAB;  Service: Cardiovascular;  Laterality: N/A;   LEFT HEART CATHETERIZATION WITH CORONARY ANGIOGRAM N/A 09/07/2014   Procedure: LEFT HEART CATHETERIZATION WITH CORONARY ANGIOGRAM;  Surgeon: Laverda Page, MD;  Location: [facility] CATH LAB;  Service: Cardiovascular;  Laterality: N/A;   LOWER EXTREMITY ANGIOGRAPHY  10/29/2017   Procedure: Lower Extremity Angiography;  Surgeon: Adrian Prows, MD;  Location: [place] CV LAB;  Service: Cardiovascular;;   PERIPHERAL VASCULAR CATHETERIZATION N/A 11/01/2015   Procedure: Renal Angiography;  Surgeon: Adrian Prows, MD;  Location: [place] CV LAB;   Service: Cardiovascular;  Laterality: N/A;   PERIPHERAL VASCULAR CATHETERIZATION N/A 04/10/2016   Procedure: Renal Angiography;  Surgeon: Adrian Prows, MD;  Location: [place] CV LAB;  Service: Cardiovascular;  Laterality: N/A;   PERIPHERAL VASCULAR CATHETERIZATION  04/10/2016   Procedure: Peripheral Vascular Intervention;  Surgeon: Adrian Prows, MD;  Location: [place] CV LAB;  Service: Cardiovascular;;   PERIPHERAL VASCULAR INTERVENTION  10/29/2017   Procedure: PERIPHERAL VASCULAR INTERVENTION;  Surgeon: Adrian Prows, MD;  Location: [place] CV LAB;  Service: Cardiovascular;;   PERIPHERAL VASCULAR INTERVENTION Bilateral 12/28/2020   Procedure: PERIPHERAL VASCULAR INTERVENTION;  Surgeon: Cherre Robins, MD;  Location: [place] CV LAB;  Service: Cardiovascular;  Laterality: Bilateral;   POLYPECTOMY  08/07/2020   Procedure: POLYPECTOMY;  Surgeon: Jackquline Denmark, MD;  [facility];  Service: Endoscopy;;   POLYPECTOMY  08/27/2020   Procedure: POLYPECTOMY;  Surgeon: Lavena Bullion, DO;  Location: [facility]OS[facility];  Service: Gastroenterology;;   POLYPECTOMY     RENAL ANGIOGRAPHY N/A 10/29/2017   Procedure: RENAL ANGIOGRAPHY;  Surgeon: Adrian Prows, MD;  Location: [place] CV LAB;  Service: Cardiovascular;  Laterality: N/A;   RENAL ANGIOGRAPHY  N/A 05/10/2020   Procedure: RENAL ANGIOGRAPHY;  Surgeon: Nigel Mormon, MD;  Location: [place] CV LAB;  Service: Cardiovascular;  Laterality: N/A;   RIGHT OOPHORECTOMY Right 10/01/1994   TOTAL ABDOMINAL HYSTERECTOMY  10/02/1995   UPPER GASTROINTESTINAL ENDOSCOPY       reports that she has been smoking cigarettes. She has a 60.00 pack-year smoking history. She has never used smokeless tobacco. She reports current drug use. Frequency: 3.00 times per week. Drug: Marijuana. She reports that she does not drink alcohol.  Allergies  Allergen Reactions   Doxycycline Anaphylaxis and Hives   Hydralazine Rash   Hydrocodone-Acetaminophen  Nausea And Vomiting   Iohexol Itching    Pt has itching nose after iv contrast injection    Family History  Problem Relation Age of Onset   Other Mother        many bowel obstructions   Heart disease Mother    Colon polyps Mother    Kidney cancer Father    Bone cancer Father    Diabetes Father    Heart disease Father    Heart disease Brother    Colon cancer Paternal Grandfather    Diabetes Daughter    Esophageal cancer Neg Hx    Rectal cancer Neg Hx    Stomach cancer Neg Hx      Prior to Admission medications   Medication Sig Start Date End Date Taking? Authorizing Provider  acetaminophen (TYLENOL) 500 MG tablet Take 1,000 mg by mouth every 8 (eight) hours as needed for mild pain or headache.   Yes [provider]  albuterol (PROVENTIL HFA;VENTOLIN HFA) 108 (90 BASE) MCG/ACT inhaler Inhale 2 puffs into the lungs every 6 (six) hours as needed for wheezing or shortness of breath (asthma).   Yes [provider]  ALPRAZolam Duanne Moron) 1 MG tablet Take 0.5 tablets by mouth 2 (two) times daily as needed for anxiety. 04/15/17  Yes [provider]  amLODipine (NORVASC) 10 MG tablet TAKE 1 TABLET BY MOUTH ONCE DAILY Patient taking differently: Take 10 mg by mouth daily. 04/13/21  Yes Cantwell, Celeste C, PA-C  aspirin EC 81 MG EC tablet Take 1 tablet (81 mg total) by mouth daily. Swallow whole. 12/30/20  Yes Cherre Robins, MD  atorvastatin (LIPITOR) 80 MG tablet Take 80 mg by mouth daily.    Yes [provider]  azelastine (ASTELIN) 0.1 % nasal spray Place 1 spray into both nostrils daily as needed for rhinitis or allergies.  06/24/19 05/09/21 Yes [provider]  clopidogrel (PLAVIX) 75 MG tablet TAKE 1 TABLET BY MOUTH ONCE A DAY Patient taking differently: Take 75 mg by mouth daily. 05/03/21  Yes Cantwell, Celeste C, PA-C  ezetimibe (ZETIA) 10 MG tablet TAKE 1 TABLET BY MOUTH ONCE A DAY Patient taking differently: Take 10 mg by mouth daily. 05/03/21   Yes Cantwell, Celeste C, PA-C  isosorbide mononitrate (IMDUR) 60 MG 24 hr tablet Take 1 tablet (60 mg total) by mouth daily. Patient taking differently: Take 30 mg by mouth daily. 03/09/21  Yes Cantwell, Celeste C, PA-C  labetalol (NORMODYNE) 100 MG tablet Take 1 tablet (100 mg total) by mouth 2 (two) times daily. 05/02/21  Yes Cantwell, Celeste C, PA-C  linaclotide (LINZESS) 72 MCG capsule Take 1 capsule (72 mcg total) by mouth daily before breakfast. 08/22/20  Yes Lemmon, Lavone Nian, PA  megestrol (MEGACE) 40 MG tablet Take 1 tablet by mouth daily as needed (appetite). 01/24/21  Yes [provider]  mometasone-formoterol Ruthe Mannan)  100-5 MCG/ACT AERO Take 2 puffs first thing in am and then another 2 puffs about 12 hours later. Patient taking differently: Inhale 2 puffs into the lungs in the morning and at bedtime. 04/26/17  Yes Tanda Rockers, MD  nitroGLYCERIN (NITROSTAT) 0.4 MG SL tablet Place 1 tablet (0.4 mg total) under the tongue every 5 (five) minutes as needed for chest pain. 10/15/19  Yes Miquel Dunn, NP  oxyCODONE-acetaminophen (PERCOCET/ROXICET) 5-325 MG tablet Take 1 tablet by mouth every 6 (six) hours as needed for moderate pain. 12/30/20  Yes Cherre Robins, MD  pantoprazole (PROTONIX) 40 MG tablet TAKE 1 TABLET BY MOUTH TWICE (2) DAILY Patient taking differently: Take 40 mg by mouth 2 (two) times daily. 06/08/20  Yes Adrian Prows, MD  promethazine (PHENERGAN) 25 MG tablet Take 25 mg by mouth daily as needed for nausea or vomiting. 08/16/20  Yes [provider]  venlafaxine XR (EFFEXOR XR) 150 MG 24 hr capsule Take 1 capsule (150 mg total) by mouth daily with breakfast. Patient taking differently: Take 300 mg by mouth daily with breakfast. 11/09/15  Yes Ottie Glazier, PA-C    Physical Exam: Vitals:   05/09/21 0215 05/09/21 0300 05/09/21 0330 05/09/21 0400  BP: (!) 203/109 (!) 197/107 (!) 197/108 (!) 180/104  Pulse: (!) 106 (!) 103 (!) 113 100  Resp: (!) 21  (!) 23 14 15   Temp:      TempSrc:      SpO2: 93% 94% 95% 93%  Weight:      Height:        Constitutional: NAD, calm, comfortable Eyes: PERRL, lids and conjunctivae normal ENMT: Mucous membranes are moist. Posterior pharynx clear of any exudate or lesions.Normal dentition.  Neck: normal, supple, no masses, no thyromegaly Respiratory: clear to auscultation bilaterally, no wheezing, no crackles. Normal respiratory effort. No accessory muscle use.  Cardiovascular: Mild Tachycardia Abdomen: no tenderness, no masses palpated. No hepatosplenomegaly. Bowel sounds positive.  Musculoskeletal: no clubbing / cyanosis. No joint deformity upper and lower extremities. Good ROM, no contractures. Normal muscle tone.  Skin: no rashes, lesions, ulcers. No induration Neurologic: CN 2-12 grossly intact. Sensation intact, DTR normal. Strength 5/5 in all 4.  Psychiatric: Normal judgment and insight. Alert and oriented x 3. Normal mood.    Labs on Admission: I have personally reviewed following labs and imaging studies  CBC: Recent Labs  Lab 05/08/21 2208  WBC 7.6  NEUTROABS 6.4  HGB 11.8*  HCT 35.0*  MCV 91.1  PLT 916*   Basic Metabolic Panel: Recent Labs  Lab 05/08/21 2208  NA 133*  K 3.8  CL 98  CO2 21*  GLUCOSE 160*  BUN 25*  CREATININE 2.86*  CALCIUM 9.6  MG 1.5*   GFR: Estimated Creatinine Clearance: 13.7 mL/min (A) (by C-G formula based on SCr of 2.86 mg/dL (H)). Liver Function Tests: Recent Labs  Lab 05/08/21 2332  AST 21  ALT 13  ALKPHOS 143*  BILITOT 0.2*  PROT 6.9  ALBUMIN 3.5   Recent Labs  Lab 05/08/21 2332  LIPASE 28   No results for input(s): AMMONIA in the last 168 hours. Coagulation Profile: No results for input(s): INR, PROTIME in the last 168 hours. Cardiac Enzymes: No results for input(s): CKTOTAL, CKMB, CKMBINDEX, TROPONINI in the last 168 hours. BNP (last 3 results) No results for input(s): PROBNP in the last 8760 hours. HbA1C: No results for  input(s): HGBA1C in the last 72 hours. CBG: No results for input(s): GLUCAP in the last  168 hours. Lipid Profile: No results for input(s): CHOL, HDL, LDLCALC, TRIG, CHOLHDL, LDLDIRECT in the last 72 hours. Thyroid Function Tests: No results for input(s): TSH, T4TOTAL, FREET4, T3FREE, THYROIDAB in the last 72 hours. Anemia Panel: No results for input(s): VITAMINB12, FOLATE, FERRITIN, TIBC, IRON, RETICCTPCT in the last 72 hours. Urine analysis:    Component Value Date/Time   COLORURINE YELLOW 08/05/2020 0640   APPEARANCEUR Cloudy (A) 03/22/2021 1432   LABSPEC 1.014 08/05/2020 0640   LABSPEC 1.016 06/07/2012 1350   PHURINE 5.0 08/05/2020 0640   GLUCOSEU Negative 03/22/2021 1432   GLUCOSEU Negative 06/07/2012 1350   HGBUR NEGATIVE 08/05/2020 0640   BILIRUBINUR Negative 03/22/2021 1432   BILIRUBINUR Negative 06/07/2012 1350   KETONESUR NEGATIVE 08/05/2020 0640   PROTEINUR 3+ (A) 03/22/2021 1432   PROTEINUR >=300 (A) 08/05/2020 0640   UROBILINOGEN 0.2 06/28/2014 1518   NITRITE Negative 03/22/2021 1432   NITRITE NEGATIVE 08/05/2020 0640   LEUKOCYTESUR Negative 03/22/2021 1432   LEUKOCYTESUR NEGATIVE 08/05/2020 0640   LEUKOCYTESUR Negative 06/07/2012 1350    Radiological Exams on Admission: CT ABDOMEN PELVIS WO CONTRAST  Result Date: 05/09/2021 CLINICAL DATA:  Abdominal pain EXAM: CT ABDOMEN AND PELVIS WITHOUT CONTRAST TECHNIQUE: Multidetector CT imaging of the abdomen and pelvis was performed following the standard protocol without IV contrast. COMPARISON:  08/24/2020 FINDINGS: Lower chest: Emphysematous changes are noted in the lung bases. A somewhat triangular density is noted in the left lower lobe laterally stable in appearance from the prior exam. Hepatobiliary: No focal liver abnormality is seen. No gallstones, gallbladder wall thickening, or biliary dilatation. Pancreas: Unremarkable. No pancreatic ductal dilatation or surrounding inflammatory changes. Spleen: Normal in size  without focal abnormality. Adrenals/Urinary Tract: Adrenal glands are within normal limits. Left kidney is somewhat shrunken when compared with the prior exam. The right kidney appears within normal limits. No renal calculi or obstructive changes are seen. Bladder is partially distended. Stomach/Bowel: No obstructive or inflammatory changes of the colon are seen. Appendix has been surgically removed. Small bowel and stomach are within normal limits. Vascular/Lymphatic: Heavy atherosclerotic calcifications of the abdominal aorta are noted. Bi iliac stenting is noted. No sizable adenopathy is seen. Reproductive: Status post hysterectomy. No adnexal masses. Other: No abdominal wall hernia or abnormality. No abdominopelvic ascites. Musculoskeletal: No acute or significant osseous findings. IMPRESSION: No acute abnormality noted. Slight decrease in size of the left kidney when compared with the prior exam. This is likely related to renal artery stenosis. Stable nodule in the left lung base dating back to 2013. No further follow-up is recommended. Aortic Atherosclerosis (ICD10-I70.0) and Emphysema (ICD10-J43.9). Electronically Signed   By: Inez Catalina M.D.   On: 05/09/2021 02:22   DG Chest 2 View  Result Date: 05/08/2021 CLINICAL DATA:  CHEST PAIN EXAM: CHEST - 2 VIEW COMPARISON:  Radiograph 06/05/2017 FINDINGS: Chronic hyperinflation and coarsened interstitial changes are similar to comparison radiographs. No new consolidative process. No pneumothorax or visible layering effusion. Stable cardiomediastinal contours with a calcified aorta. No acute osseous or soft tissue abnormality. Telemetry leads overlie the chest. IMPRESSION: No acute cardiopulmonary abnormality. Chronic hyperinflation, coarsened interstitial changes. Electronically Signed   By: Lovena Le M.D.   On: 05/08/2021 22:37    EKG: Independently reviewed.  Assessment/Plan Principal Problem:   Chest pain, rule out acute myocardial  infarction Active Problems:   CKD (chronic kidney disease) stage 4, GFR 15-29 ml/min (HCC)   Renovascular hypertension, malignant   Renal artery stenosis, native, bilateral (HCC)   Chronic mesenteric ischemia (  [place])    CP r/o - Despite no h/o CAD and clean coronaries in 2019, pt obviously very high risk with calcium on CT chest, extensive vascular dz in abdominal aorta. CP obs pathway Tele monitor NPO Per Dr. Einar Gip - Admit to medicine Keep NPO Plan on likely LHC UDS pending NTG PRN Morphine PRN if needed CKD 4 - Keep eye on creatinine, especially if LHC performed RAS - Will go ahead and get renal artery [place] as long as she is here to see if any evidence of re-stenosis, especially with initial BPs in the 200s on presentation to ED. Chronic mesenteric ischemia - No lactate elevation Will get [place] of mesenteric artery But does sound like she has been having some chronic abd pain symptoms for past few months (based on various office notes) despite stenting in March this year. HTN - Cont home BP meds Add PRN labetalol NTG PRN for CP  DVT prophylaxis: Heparin Brock Code Status: Full Family Communication: No family in room Disposition Plan: Home after cleared by cards Consults called: Dr. Einar Gip Admission status: Place in obs   Hajime Asfaw, [place] Hospital[facility]  How to contact the [facility] Attending or Consulting provider [place] or covering provider during after hours [place], for this patient?  Check the care team in [facility] and look for a) attending/consulting [facility] provider listed and b) the [facility] team listed Log into [email or web address]  Amion Physician Scheduling and messaging for groups and whole hospitals  On call and physician scheduling software for group practices, residents, hospitalists and other medical providers for call, clinic, rotation and shift schedules. OnCall [employer] is a hospital-wide system for scheduling doctors and paging doctors on call. EasyPlot is for scientific  plotting and data analysis.  [email or web address]  and use [place]'s universal password to access. If you do not have the password, please contact the hospital operator.  Locate the [facility] provider you are looking for under [facility] and page to a number that you can be directly reached. If you still have difficulty reaching the provider, please page the [facility] (Director on Call) for the Hospitalists listed on amion for assistance.  05/09/2021, 4:57 AM

## 2021-05-09 NOTE — ED Notes (Addendum)
Pt bp is now 74/55. Dr.Rizwan paged at this time. Pt remains alert and oriented x 4. Pt reports some dizziness. Reports she has not ate or drank since yesterday. Cardiac monitoring in place.

## 2021-05-09 NOTE — ED Notes (Signed)
[name] [phone] grandson. Please update on status

## 2021-05-09 NOTE — Progress Notes (Signed)
TR[facility] short progress note   Chart reveiwed and patient examined. Admitted for substernal chest pain and very elevated BP.   Today's Vitals   05/09/21 1503 05/09/21 1532 05/09/21 1535 05/09/21 1546  BP:  (!) 142/72 130/74 131/67  Pulse: (!) 0 83 80 86  Resp: (!) 8 18    Temp:      TempSrc:      SpO2: (!) 0% 99% 92% 91%  Weight:      Height:      PainSc:  0-No pain 0-No pain    Body mass index is 16.66 kg/m.   Principal Problem:   Chest pain, rule out acute myocardial infarction - troponin 37 and 77 - s/p cath today- no stenosis noted - likely due to uncontrolled BP - BP now much improved- cont to follow Q4 hrs  Active Problems: Hypertensive crisis - BP 229/115, 221/118 - given Labetalol IV in addition to oral  - BP now down to 676-195K systolic  Mild lactic acidosis - due to high BP and poor perfusion - recheck now    CKD (chronic kidney disease) stage 4, GFR 15-29 ml/min (HCC) - baseline Cr 1.9-2.1 - recheck Cr in AM   PAD, stenosis of distal aorta, chronic mesenteric ischemia, Renal artery stenosis, native, bilateral (HCC) - avoid ACE I - avoid dropping BP too low - cont ASA/ Plavix/ Statin - cont Phenergan for nausea/ vomiting  Anxiety/ depression - cont Xanax PRN and Effexor  Chronic pain - cont Oxycodone  Debbe Odea, MD

## 2021-05-09 NOTE — ED Notes (Signed)
Pt told RN she has not urinated since 8PM last night. Bladder scanner showed 121cc. Pt denies any bladder pain at this time. Appears comfortable. Hx of CKD 4.

## 2021-05-09 NOTE — ED Notes (Signed)
Patient transported to CT 

## 2021-05-09 NOTE — Progress Notes (Signed)
Renal artery duplex study completed.  Attempted to page preliminary results to Wynelle Cleveland, MD at [place]. No return page. Secure chat to [place], [place]. No response. Paged Stanford Breed, MD with vascular to give results.   See CV Proc for preliminary results report.   Darlin Coco, RDMS, RVT

## 2021-05-09 NOTE — Progress Notes (Signed)
TR band off. No more bleeding.

## 2021-05-09 NOTE — ED Notes (Signed)
Vascular at bedside

## 2021-05-09 NOTE — Plan of Care (Signed)

## 2021-05-09 NOTE — Interval H&P Note (Signed)
History and Physical Interval Note:  05/09/2021 2:38 PM  [name]  has presented today for surgery, with the diagnosis of chest pain.  The various methods of treatment have been discussed with the patient and family. After consideration of risks, benefits and other options for treatment, the patient has consented to  Procedure(s): LEFT HEART CATH AND CORONARY ANGIOGRAPHY (N/A) as a surgical intervention.  [name] history has been reviewed, patient examined, no change in status, stable for surgery.  I have reviewed the patient's chart and labs.  Questions were answered to the patient's satisfaction.    Cath Lab Visit (complete for each Cath Lab visit)  Clinical Evaluation Leading to the Procedure:   ACS: Yes.    Non-ACS:    Anginal Classification: CCS IV  Anti-ischemic medical therapy: Maximal Therapy (2 or more classes of medications)  Non-Invasive Test Results: No non-invasive testing performed  Prior CABG: No previous CABG  Adrian Prows

## 2021-05-10 DIAGNOSIS — R079 Chest pain, unspecified: Secondary | ICD-10-CM | POA: Diagnosis not present

## 2021-05-10 DIAGNOSIS — R9431 Abnormal electrocardiogram [ECG] [EKG]: Secondary | ICD-10-CM | POA: Diagnosis not present

## 2021-05-10 DIAGNOSIS — K551 Chronic vascular disorders of intestine: Secondary | ICD-10-CM | POA: Diagnosis not present

## 2021-05-10 DIAGNOSIS — N184 Chronic kidney disease, stage 4 (severe): Secondary | ICD-10-CM | POA: Diagnosis not present

## 2021-05-10 LAB — CBC
HCT: 25.9 % — ABNORMAL LOW (ref 36.0–46.0)
Hemoglobin: 8.4 g/dL — ABNORMAL LOW (ref 12.0–15.0)
MCH: 30.7 pg (ref 26.0–34.0)
MCHC: 32.4 g/dL (ref 30.0–36.0)
MCV: 94.5 fL (ref 80.0–100.0)
Platelets: 307 10*3/uL (ref 150–400)
RBC: 2.74 MIL/uL — ABNORMAL LOW (ref 3.87–5.11)
RDW: 14.6 % (ref 11.5–15.5)
WBC: 6.1 10*3/uL (ref 4.0–10.5)
nRBC: 0 % (ref 0.0–0.2)

## 2021-05-10 LAB — BASIC METABOLIC PANEL
Anion gap: 5 (ref 5–15)
BUN: 24 mg/dL — ABNORMAL HIGH (ref 6–20)
CO2: 25 mmol/L (ref 22–32)
Calcium: 8.4 mg/dL — ABNORMAL LOW (ref 8.9–10.3)
Chloride: 102 mmol/L (ref 98–111)
Creatinine, Ser: 3.2 mg/dL — ABNORMAL HIGH (ref 0.44–1.00)
GFR, Estimated: 16 mL/min — ABNORMAL LOW (ref >60)
Glucose, Bld: 92 mg/dL (ref 70–99)
Potassium: 3.9 mmol/L (ref 3.5–5.1)
Sodium: 132 mmol/L — ABNORMAL LOW (ref 135–145)

## 2021-05-10 MED FILL — Diphenhydramine HCl Inj 50 MG/ML: INTRAMUSCULAR | Qty: 1 | Status: AC

## 2021-05-10 MED FILL — Heparin Sodium (Porcine) Inj 1000 Unit/ML: INTRAMUSCULAR | Qty: 10 | Status: AC

## 2021-05-10 NOTE — Progress Notes (Signed)
PROGRESS NOTE    BRAD MCGAUGHY  BTD:[id number] DOB: 1961-08-21 DOA: 05/08/2021 PCP: Bernerd Limbo, MD    Brief Narrative:  Mrs. [name] was admitted to the hospital with the working diagnosis of chest pain to rule out ACS,   60 year old female past medical history for malignant renovascular hypertension, renal artery stenosis, chronic kidney stage IV, chronic visceral ischemia, peripheral artery disease, stenosis of distal aorta, COPD, schizophrenia who presented with chest pain.  She reported 3 weeks of abdominal pain associated with intermittent nausea and vomiting.  Symptoms worsened with substernal chest pain that prompted her to come to the hospital.  On her initial physical examination blood pressure 229/115, heart rate 135, respiratory rate 23, oxygen saturation 94%, lungs are clear to auscultation bilaterally, heart S1-S2, present, rhythmic, abdomen nontender, nondistended, no lower extremity edema.  Sodium 133, potassium 3.8, chloride 98, bicarb 21, glucose 160, BUN 25, creatinine 2.8, magnesium 1.5, high sensitive troponin 37-77, white count 7.6, hemoglobin 8.8, hematocrit 35.0, platelets 461. SARS COVID-19 negative.  CT abdomen pelvis no acute changes. Chest radiograph with hyperinflation.  EKG 132 bpm, normal axis, normal intervals, sinus rhythm, poor R wave progression, ST depressions V4-V6, no significant T wave changes, positive LVH.  Assessment & Plan:   Principal Problem:   Chest pain, rule out acute myocardial infarction Active Problems:   CKD (chronic kidney disease) stage 4, GFR 15-29 ml/min (HCC)   Renovascular hypertension, malignant   Hypertensive urgency   Renal artery stenosis, native, bilateral (HCC)   Chronic mesenteric ischemia (HCC)   Abnormal EKG   Atypical chest pain, hypertensive emergency. Patient ruled out for acute coronary syndrome. Blood pressure better controlled with amlodipine and labetalol.  Telemetry personally reviewed with hr 65, atrial  rhythm.   Continue with isosorbide   2. AKI on CKD stage 4. Worsening renal function with serum cr up to 3,2, with K at 3,9 and serum bicarbonate at 25 Amlodipine reduced to 5 mg and patient received IV fluids post cath.  Plan to follow up renal function in am, avoid hypotension and nephrotoxic medications  3. Chronic anemia hgb down to 8,4 with hct at 25,9. No signs of acute blood loss, plan to repeat cell count in am.   4, PVD/ dyslipidemia. No further nausea or vomiting.  Continue with atorvastatin and ezetimibe,   5. Depression. Continue with venlafaxine.,   Patient continue to be at high risk for worsening renal function   Status is: Observation  The patient remains OBS appropriate and will d/c before 2 midnights.  Dispo: The patient is from: Home              Anticipated d/c is to: Home              Patient currently is not medically stable to d/c.   Difficult to place patient No   DVT prophylaxis: Enoxaparin   Code Status:   full  Family Communication:  No family at the bedside       Consultants:  Cardiology   Procedures:  Cardiac cath   Subjective: Patient with no nausea or vomiting, no chest pain or dyspnea,.   Objective: Vitals:   05/10/21 0403 05/10/21 0838 05/10/21 0853 05/10/21 1136  BP: (!) 151/75 (!) 160/83  (!) 154/71  Pulse: 78 72 74 72  Resp: 18 18 18 18   Temp: 98.2 F (36.8 C) 97.8 F (36.6 C)  98.5 F (36.9 C)  TempSrc: Oral Oral  Oral  SpO2: 97% 94% 93% 97%  Weight: 40.4 kg     Height:        Intake/Output Summary (Last 24 hours) at 05/10/2021 1558 Last data filed at 05/10/2021 0846 Gross per 24 hour  Intake 2029.9 ml  Output 710 ml  Net 1319.9 ml   Filed Weights   05/08/21 2152 05/09/21 1305 05/10/21 0403  Weight: 40.6 kg 39.6 kg 40.4 kg    Examination:   General: Not in pain or dyspnea.  Neurology: Awake and alert, non focal  E ENT: mild pallor, no icterus, oral mucosa moist Cardiovascular: No JVD. S1-S2 present,  rhythmic, no gallops, rubs, or murmurs. No lower extremity edema. Pulmonary: positive  breath sounds bilaterally, adequate air movement, no wheezing, rhonchi or rales. Gastrointestinal. Abdomen soft and non tender Skin. No rashes Musculoskeletal: no joint deformities     Data Reviewed: I have personally reviewed following labs and imaging studies  CBC: Recent Labs  Lab 05/08/21 2208 05/10/21 0442  WBC 7.6 6.1  NEUTROABS 6.4  --   HGB 11.8* 8.4*  HCT 35.0* 25.9*  MCV 91.1 94.5  PLT 461* 798   Basic Metabolic Panel: Recent Labs  Lab 05/08/21 2208 05/10/21 0442  NA 133* 132*  K 3.8 3.9  CL 98 102  CO2 21* 25  GLUCOSE 160* 92  BUN 25* 24*  CREATININE 2.86* 3.20*  CALCIUM 9.6 8.4*  MG 1.5*  --    GFR: Estimated Creatinine Clearance: 12.2 mL/min (A) (by C-G formula based on SCr of 3.2 mg/dL (H)). Liver Function Tests: Recent Labs  Lab 05/08/21 2332  AST 21  ALT 13  ALKPHOS 143*  BILITOT 0.2*  PROT 6.9  ALBUMIN 3.5   Recent Labs  Lab 05/08/21 2332  LIPASE 28   No results for input(s): AMMONIA in the last 168 hours. Coagulation Profile: No results for input(s): INR, PROTIME in the last 168 hours. Cardiac Enzymes: No results for input(s): CKTOTAL, CKMB, CKMBINDEX, TROPONINI in the last 168 hours. BNP (last 3 results) No results for input(s): PROBNP in the last 8760 hours. HbA1C: No results for input(s): HGBA1C in the last 72 hours. CBG: No results for input(s): GLUCAP in the last 168 hours. Lipid Profile: No results for input(s): CHOL, HDL, LDLCALC, TRIG, CHOLHDL, LDLDIRECT in the last 72 hours. Thyroid Function Tests: No results for input(s): TSH, T4TOTAL, FREET4, T3FREE, THYROIDAB in the last 72 hours. Anemia Panel: No results for input(s): VITAMINB12, FOLATE, FERRITIN, TIBC, IRON, RETICCTPCT in the last 72 hours.    Radiology Studies: I have reviewed all of the imaging during this hospital visit personally     Scheduled Meds:  amLODipine  5  mg Oral Daily   aspirin EC  81 mg Oral Daily   atorvastatin  80 mg Oral Daily   ezetimibe  10 mg Oral Daily   heparin  5,000 Units Subcutaneous Q8H   isosorbide mononitrate  30 mg Oral Daily   labetalol  100 mg Oral BID   linaclotide  72 mcg Oral QAC breakfast   mometasone-formoterol  2 puff Inhalation BID   pantoprazole  40 mg Oral BID   sodium chloride flush  3 mL Intravenous Q12H   sodium chloride flush  3 mL Intravenous Q12H   venlafaxine XR  300 mg Oral Q breakfast   Continuous Infusions:  sodium chloride     promethazine (PHENERGAN) injection (IM or IVPB) Stopped (05/09/21 0020)     LOS: 0 days        Nayleah Gamel Gerome Apley, MD

## 2021-05-10 NOTE — Plan of Care (Signed)

## 2021-05-11 DIAGNOSIS — F419 Anxiety disorder, unspecified: Secondary | ICD-10-CM | POA: Diagnosis present

## 2021-05-11 DIAGNOSIS — E874 Mixed disorder of acid-base balance: Secondary | ICD-10-CM | POA: Diagnosis present

## 2021-05-11 DIAGNOSIS — I959 Hypotension, unspecified: Secondary | ICD-10-CM | POA: Diagnosis not present

## 2021-05-11 DIAGNOSIS — R000 Tachycardia, unspecified: Secondary | ICD-10-CM | POA: Diagnosis present

## 2021-05-11 DIAGNOSIS — D649 Anemia, unspecified: Secondary | ICD-10-CM | POA: Diagnosis present

## 2021-05-11 DIAGNOSIS — Z20822 Contact with and (suspected) exposure to covid-19: Secondary | ICD-10-CM | POA: Diagnosis present

## 2021-05-11 DIAGNOSIS — G8929 Other chronic pain: Secondary | ICD-10-CM | POA: Diagnosis present

## 2021-05-11 DIAGNOSIS — Z681 Body mass index (BMI) 19 or less, adult: Secondary | ICD-10-CM | POA: Diagnosis not present

## 2021-05-11 DIAGNOSIS — I10 Essential (primary) hypertension: Secondary | ICD-10-CM | POA: Diagnosis not present

## 2021-05-11 DIAGNOSIS — N170 Acute kidney failure with tubular necrosis: Secondary | ICD-10-CM | POA: Diagnosis present

## 2021-05-11 DIAGNOSIS — J439 Emphysema, unspecified: Secondary | ICD-10-CM | POA: Diagnosis present

## 2021-05-11 DIAGNOSIS — F209 Schizophrenia, unspecified: Secondary | ICD-10-CM | POA: Diagnosis not present

## 2021-05-11 DIAGNOSIS — R079 Chest pain, unspecified: Secondary | ICD-10-CM | POA: Diagnosis not present

## 2021-05-11 DIAGNOSIS — R519 Headache, unspecified: Secondary | ICD-10-CM | POA: Diagnosis present

## 2021-05-11 DIAGNOSIS — N179 Acute kidney failure, unspecified: Secondary | ICD-10-CM | POA: Diagnosis present

## 2021-05-11 DIAGNOSIS — N184 Chronic kidney disease, stage 4 (severe): Secondary | ICD-10-CM | POA: Diagnosis present

## 2021-05-11 DIAGNOSIS — G2581 Restless legs syndrome: Secondary | ICD-10-CM | POA: Diagnosis present

## 2021-05-11 DIAGNOSIS — E871 Hypo-osmolality and hyponatremia: Secondary | ICD-10-CM | POA: Diagnosis not present

## 2021-05-11 DIAGNOSIS — I739 Peripheral vascular disease, unspecified: Secondary | ICD-10-CM | POA: Diagnosis present

## 2021-05-11 DIAGNOSIS — E785 Hyperlipidemia, unspecified: Secondary | ICD-10-CM | POA: Diagnosis present

## 2021-05-11 DIAGNOSIS — I5032 Chronic diastolic (congestive) heart failure: Secondary | ICD-10-CM | POA: Diagnosis present

## 2021-05-11 DIAGNOSIS — M549 Dorsalgia, unspecified: Secondary | ICD-10-CM | POA: Diagnosis present

## 2021-05-11 DIAGNOSIS — I21A1 Myocardial infarction type 2: Secondary | ICD-10-CM | POA: Diagnosis present

## 2021-05-11 DIAGNOSIS — I161 Hypertensive emergency: Secondary | ICD-10-CM | POA: Diagnosis present

## 2021-05-11 DIAGNOSIS — I701 Atherosclerosis of renal artery: Secondary | ICD-10-CM | POA: Diagnosis not present

## 2021-05-11 DIAGNOSIS — I200 Unstable angina: Secondary | ICD-10-CM | POA: Diagnosis present

## 2021-05-11 DIAGNOSIS — K551 Chronic vascular disorders of intestine: Secondary | ICD-10-CM | POA: Diagnosis not present

## 2021-05-11 DIAGNOSIS — I130 Hypertensive heart and chronic kidney disease with heart failure and stage 1 through stage 4 chronic kidney disease, or unspecified chronic kidney disease: Secondary | ICD-10-CM | POA: Diagnosis present

## 2021-05-11 DIAGNOSIS — F319 Bipolar disorder, unspecified: Secondary | ICD-10-CM | POA: Diagnosis present

## 2021-05-11 DIAGNOSIS — N280 Ischemia and infarction of kidney: Secondary | ICD-10-CM | POA: Diagnosis present

## 2021-05-11 LAB — BASIC METABOLIC PANEL
Anion gap: 9 (ref 5–15)
BUN: 25 mg/dL — ABNORMAL HIGH (ref 6–20)
CO2: 26 mmol/L (ref 22–32)
Calcium: 8.6 mg/dL — ABNORMAL LOW (ref 8.9–10.3)
Chloride: 99 mmol/L (ref 98–111)
Creatinine, Ser: 3.41 mg/dL — ABNORMAL HIGH (ref 0.44–1.00)
GFR, Estimated: 15 mL/min — ABNORMAL LOW (ref >60)
Glucose, Bld: 84 mg/dL (ref 70–99)
Potassium: 4.4 mmol/L (ref 3.5–5.1)
Sodium: 134 mmol/L — ABNORMAL LOW (ref 135–145)

## 2021-05-11 LAB — HEMOGLOBIN AND HEMATOCRIT, BLOOD
HCT: 25.9 % — ABNORMAL LOW (ref 36.0–46.0)
Hemoglobin: 8.4 g/dL — ABNORMAL LOW (ref 12.0–15.0)

## 2021-05-11 MED ORDER — SODIUM CHLORIDE 0.9 % IV SOLN: INTRAVENOUS | Status: DC

## 2021-05-11 NOTE — Progress Notes (Signed)
Subjective:  No chest pain, still has mild abdominal discomfort.  Intake/Output from previous day:  I/O last 3 completed shifts: In: 1916.6 [P.O.:1500; I.V.:366.8; IV Piggyback:49.9] Out: 600 [Urine:600] Total I/O In: -  Out: 300 [Urine:300]  Blood pressure (!) 151/78, pulse 66, temperature 98.1 F (36.7 C), resp. rate 18, height 5' 1.5" (1.562 m), weight 41.7 kg, SpO2 94 %. Physical Exam Constitutional:      Appearance: She is underweight. She is ill-appearing.  Eyes:     Extraocular Movements: Extraocular movements intact.  Neck:     Vascular: No carotid bruit or JVD.  Cardiovascular:     Rate and Rhythm: Normal rate and regular rhythm.     Pulses: Intact distal pulses.     Heart sounds: Normal heart sounds. No murmur heard.   No gallop.  Pulmonary:     Effort: Pulmonary effort is normal.     Breath sounds: Normal breath sounds.  Abdominal:     General: Bowel sounds are normal.     Palpations: Abdomen is soft.  Musculoskeletal:     Cervical back: Neck supple.     Right lower leg: No edema.     Left lower leg: No edema.  Skin:    General: Skin is warm.     Capillary Refill: Capillary refill takes less than 2 seconds.  Neurological:     General: No focal deficit present.     Mental Status: She is alert.    Lab Results: BMP BNP (last 3 results) No results for input(s): BNP in the last 8760 hours.  ProBNP (last 3 results) No results for input(s): PROBNP in the last 8760 hours. BMP Latest Ref Rng & Units 05/11/2021 05/10/2021 05/08/2021  Glucose 70 - 99 mg/dL 84 92 160(H)  BUN 6 - 20 mg/dL 25(H) 24(H) 25(H)  Creatinine 0.44 - 1.00 mg/dL 3.41(H) 3.20(H) 2.86(H)  BUN/Creat Ratio 9 - 23 - - -  Sodium 135 - 145 mmol/L 134(L) 132(L) 133(L)  Potassium 3.5 - 5.1 mmol/L 4.4 3.9 3.8  Chloride 98 - 111 mmol/L 99 102 98  CO2 22 - 32 mmol/L 26 25 21(L)  Calcium 8.9 - 10.3 mg/dL 8.6(L) 8.4(L) 9.6   Hepatic Function Latest Ref Rng & Units 05/08/2021 08/27/2020 08/24/2020   Total Protein 6.5 - 8.1 g/dL 6.9 6.7 5.9(L)  Albumin 3.5 - 5.0 g/dL 3.5 3.5 3.0(L)  AST 15 - 41 U/L '21 29 20  ' ALT 0 - 44 U/L '13 14 11  ' Alk Phosphatase 38 - 126 U/L 143(H) 92 80  Total Bilirubin 0.3 - 1.2 mg/dL 0.2(L) 0.9 0.4  Bilirubin, Direct 0.0 - 0.2 mg/dL <0.1 - -   CBC Latest Ref Rng & Units 05/10/2021 05/08/2021 02/09/2021  WBC 4.0 - 10.5 K/uL 6.1 7.6 9.3  Hemoglobin 12.0 - 15.0 g/dL 8.4(L) 11.8(L) 11.2(L)  Hematocrit 36.0 - 46.0 % 25.9(L) 35.0(L) 32.9(L)  Platelets 150 - 400 K/uL 307 461(H) 446.0(H)   Lipid Panel     Component Value Date/Time   CHOL 119 03/31/2020 1336   TRIG 92 03/31/2020 1336   HDL 50 03/31/2020 1336   CHOLHDL 2.5 10/14/2019 1408   CHOLHDL 3.7 10/08/2012 0228   VLDL 30 10/08/2012 0228   LDLCALC 51 03/31/2020 1336   Cardiac Panel (last 3 results) No results for input(s): CKTOTAL, CKMB, TROPONINI, RELINDX in the last 72 hours.  HEMOGLOBIN A1C No results found for: HGBA1C, MPG TSH No results for input(s): TSH in the last 8760 hours. Imaging:  Imaging results have been  reviewed  Cardiac Studies:  Abdominal aortogram 10/29/2017: Widely patent renal arteries. Distal abdominal aorta diffuse disease, calcification with 80% stenosis S/P 10 x 29 mm Omnilink Elite stent, 80% to 0%.    Lexiscan myoview stress test 02/17/2018:  1. Lexiscan stress test was performed. Exercise capacity was not assessed. Stress symptoms included dizziness, nausea, headache, and chest pressure.. Peak blood pressure was 176/88 mmHg. Stress EKG is non diagnostic for ischemia as it is a pharmacologic stress. In addition, it showed The stress electrocardiogram showed sinus tachycardia, normal stress conduction, left ventricular hypertrophy, no stress arrhythmias and normal stress repolarization. 2. The overall quality of the study is excellent. Left ventricular cavity is noted to be normal on the rest and stress studies. Gated SPECT images reveal normal myocardial thickening and wall  motion. The left ventricular ejection fraction was calculated or visually estimated to be 78%. SPECT images demonstrate small perfusion abnormality of moderate intensity in the mid anterolateral and apical lateral myocardial wall(s) on the stress images, reversible on rest images. Findings suggest small area of moderate intensity ischemia in mid to apical anterolateral myocardium. Intermediate risk study.   Echocardiogram 10/16/2019: Normal LV systolic function with EF 60%. Left ventricle cavity is normal in size. Normal global wall motion. Normal diastolic filling pattern. Calculated EF 60%. Left atrial cavity is mildly dilated in 4 chamber views. Structurally normal mitral valve.  Mild (Grade I) mitral regurgitation. Structurally normal tricuspid valve.  Mild tricuspid regurgitation. No evidence of pulmonary hypertension. Compared to 02/25/2017, no significant change.   Renal angiogram 05/10/2020: Patent Rt renal stent (placed 2015,  6.0 x 15 mm Herculink) 100% restenosis of Lt renals stent with flush occlusion 6.0 x 18 mm Herculink)   Successful angioplasty Lt renal stent Antegrade dissection in distal Lt renal artery with decreased distal flow, too distal for stenting. There is blush in left kidney. She will be monitored for 6-8 hrs for any signs of renal infarct.   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 Stenosis in the  right external carotid artery (<50%).  Stenosis in the left internal carotid artery (16-49%). Stenosis in the  left external carotid artery (<50%).  Antegrade right vertebral artery flow. Antegrade left vertebral artery  flow.  Follow up in 12 months is appropriate if clinically indicated.  Compared to the study done on 04/08/2020, left ICA stenosis was in the  range of 50-69%., now <50%.   Mesenteric Duplex 12/20/2020: :  Velocities suggest 70 to 99% stenosis in the superior mesenteric artery and >50% stenosis in  the distal aorta. Extensive plaque was noted throughout abdominal aorta.   Abdominal aortogram with lower extremity and peripheral vascular intervention 12/28/2020: Aortogram reveals prior terminal aortic stenting prior renal stenting CO2 aortography performed for majority of diagnostic studies 70% SMA stenosis. 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 95% right common iliac stenosis Unremarkable SMA stenting. Unable to track a stent into the celiac artery.  I elected to not continue because of the risk of contrast nephropathy. Right common iliac stenosis stented.  Post stenting we noted perforation with extravasation in the right common iliac artery as well as ulceration in the aortic stent. I elected to cover all of these lesions with terminal aortic stenting and bilateral common iliac artery stenting. Angiogram shows excellent flow through the terminal aorta and through the aortic bifurcation.   PLAN: Successful SMA revascularization.  Unfortunately required aorto and bilateral common iliac artery stenting.  Admit 4E. Needs dual antiplatelet therapy and high intensity  statin going forward.  Continue IV hydration.  Check basic metabolic panel in the morning.  Give diet and see how she tolerates food.   Mesenteric duplex 01/16/2021: SMA stent not well visualized, however SMA is patent with no evidence of stenosis. Aortoiliac stent is patent with no evidence of stenosis. >50% stenosis is noted distal to the right CIA stent.   Left Heart Catheterization 05/09/21:  LV: 108/2, EDP 12 mmHg.  Ao 111/58, mean 106 mmHg.  No pressure gradient across the aortic valve.  LVEDP is normal.  LV gram not performed to conserve contrast. LM: Large-caliber vessel, smooth and normal. LAD: Very large caliber vessel, gives origin to a large D1 and several small diagonals.  The LAD has mild proximal coronary calcification without luminal obstruction.  The LAD and diagonal are tortuous. Circumflex: There is minimal ectasia noted in the  midsegment otherwise smooth and normal.  Gives origin to a moderate-sized OM1 and a large OM 2. RCA: Ostium has a 30% calcific stenosis.  No damping of pressure in spite of the catheter being placed deep inside the artery.  Otherwise RCA smooth and mildly tortuous.  Impression: Abnormal EKG and positive cardiac markers is due to demand ischemia from hypertensive urgency.  With the same home dose medications, blood pressure is now well controlled and hence I am beginning to suspect whether compliance is an issue with medications.  I have reduced the dose of amlodipine to avoid hypotension as she had an episode this morning. Only 10 mL of contrast was utilized and patient was administered 1 L of Ringer's lactate for hydration in view of low EDP/normal EDP (low for her).  From cardiac standpoint she can come off of Plavix as she is now having significant GI issues.  If abdominal discomfort persist, she may need GI consult.  EKG:   EKG 05/08/2021: Sinus tachycardia at rate of 122 bpm, normal axis, 2 mm downsloping inferior and lateral ST segment depression consistent with subendocardial ischemia versus subendocardial infarct.  Voltage criteria for LVH.  Compared to prior EKG 05/12/2021, ST changes are new.   Scheduled Meds:  amLODipine  5 mg Oral Daily   aspirin EC  81 mg Oral Daily   atorvastatin  80 mg Oral Daily   ezetimibe  10 mg Oral Daily   heparin  5,000 Units Subcutaneous Q8H   isosorbide mononitrate  30 mg Oral Daily   labetalol  100 mg Oral BID   linaclotide  72 mcg Oral QAC breakfast   mometasone-formoterol  2 puff Inhalation BID   pantoprazole  40 mg Oral BID   sodium chloride flush  3 mL Intravenous Q12H   sodium chloride flush  3 mL Intravenous Q12H   venlafaxine XR  300 mg Oral Q breakfast   Continuous Infusions:  sodium chloride     promethazine (PHENERGAN) injection (IM or IVPB) Stopped (05/09/21 0020)   PRN Meds:.sodium chloride, acetaminophen, albuterol, ALPRAZolam,  megestrol, morphine injection, nitroGLYCERIN, ondansetron (ZOFRAN) IV, oxyCODONE-acetaminophen, promethazine (PHENERGAN) injection (IM or IVPB), promethazine, sodium chloride flush  Assessment/Plan:   Hypertensive Urgency Acute on chronic renal failure stage 4 due to ATN from relative hypotension with control of BP 3. Anemia, h/o GI Bleed and Melena per patient's history on and off AND H/O colonic polyps.  4.   PAD with bilateral renal artery stenting and occluded left renal artery and atretic left kidney. 5.   Chest pain secondary to hypertensive urgency.  No evidence of non-ST elevation MI, elevated troponin is a secondary  leak, type II MI.  Rec:   Would later have relative hypertension, goal blood pressure 150 mmHg.  With the same medications that she was on at home, blood pressure was well controlled while she is in the hospital, may suggest that she may have missed labetalol dosing at home.  I have reduced the dose of amlodipine to 5 mg daily in view of low blood pressure noted yesterday and low LVEDP during cardiac catheterization.  Hemoglobin is trending down, I have discontinued Plavix from PAD standpoint, continue aspirin alone.  Otherwise she is stable from cardiac standpoint, please call if questions.   Adrian Prows, MD, [facility] 05/11/2021, [place] PM Office: [phone] Fax: [phone] Pager: [phone]

## 2021-05-11 NOTE — Progress Notes (Addendum)
PROGRESS NOTE    [name]  MOL:[id number] DOB: 10-Jan-1961 DOA: 05/08/2021 PCP: Bernerd Limbo, MD    Brief Narrative:  Mrs. Lierman was admitted to the hospital with the working diagnosis of chest pain to rule out ACS,    60 year old female past medical history for malignant renovascular hypertension, renal artery stenosis, chronic kidney stage IV, chronic visceral ischemia, peripheral artery disease, stenosis of distal aorta, COPD, schizophrenia who presented with chest pain.  She reported 3 weeks of abdominal pain associated with intermittent nausea and vomiting.  Symptoms worsened with substernal chest pain that prompted her to come to the hospital.  On her initial physical examination blood pressure 229/115, heart rate 135, respiratory rate 23, oxygen saturation 94%, lungs are clear to auscultation bilaterally, heart S1-S2, present, rhythmic, abdomen nontender, nondistended, no lower extremity edema.   Sodium 133, potassium 3.8, chloride 98, bicarb 21, glucose 160, BUN 25, creatinine 2.8, magnesium 1.5, high sensitive troponin 37-77, white count 7.6, hemoglobin 8.8, hematocrit 35.0, platelets 461. SARS COVID-19 negative.   CT abdomen pelvis no acute changes. Chest radiograph with hyperinflation.   EKG 132 bpm, normal axis, normal intervals, sinus rhythm, poor R wave progression, ST depressions V4-V6, no significant T wave changes, positive LVH.  Patient underwent further work up with cardiac catheterization which showed no significant stenosis.   Now with worsening renal failure.  Assessment & Plan:   Principal Problem:   Chest pain, rule out acute myocardial infarction Active Problems:   CKD (chronic kidney disease) stage 4, GFR 15-29 ml/min (HCC)   Renovascular hypertension, malignant   Hypertensive urgency   Renal artery stenosis, native, bilateral (HCC)   Chronic mesenteric ischemia (HCC)   Abnormal EKG     Atypical chest pain, hypertensive emergency. Patient ruled  out for acute coronary syndrome.  Continue blood pressure control with labetalol, isosorbide and amlodipine.  Continue blood pressure monitoring.    2. AKI on CKD stage 4. NEW hyponatremia and metabolic alkalosis. Worsening renal function with serum cr at 3,41 from 3,20 K is 4,4 and serum bicarbonate is 26. NA 134  Plan to add IV fluids for possible pre-renal component NS at 75 ml per hr and follow up renal functio in am.    3. Chronic anemia stable hgb and hct with follow up 8,54 and 25,9 No indication for PRBC transfusion at this point in time.    4, PVD/ dyslipidemia.  Continue with atorvastatin and ezetimibe,    5. Depression. On venlafaxine.,    Patient continue to be at high risk for worsening renal function   Status is: Observation  The patient will require care spanning > 2 midnights and should be moved to inpatient because: IV treatments appropriate due to intensity of illness or inability to take PO  Dispo: The patient is from: Home              Anticipated d/c is to: Home              Patient currently is not medically stable to d/c.   Difficult to place patient No   DVT prophylaxis: Heparin   Code Status:   full  Family Communication:  No family at the bedside     Consultants:  Cardiology   Procedures:  Left heart cath     Subjective: Patient wit no nausea or vomiting, no dyspnea or chest pain, worsening renal functin   Objective: Vitals:   05/11/21 0007 05/11/21 0354 05/11/21 0757 05/11/21 1226  BP: (!) 151/82  136/61 (!) 151/78 139/65  Pulse: 67 63 66 65  Resp: 17 17 18 18   Temp: 98.3 F (36.8 C) 98.1 F (36.7 C)    TempSrc: Oral     SpO2: 96% 93% 94% 95%  Weight:  41.7 kg    Height:        Intake/Output Summary (Last 24 hours) at 05/11/2021 1342 Last data filed at 05/11/2021 0900 Gross per 24 hour  Intake 560 ml  Output 500 ml  Net 60 ml   Filed Weights   05/09/21 1305 05/10/21 0403 05/11/21 0354  Weight: 39.6 kg 40.4 kg 41.7 kg     Examination:   General: Not in pain or dyspnea.  Neurology: Awake and alert, non focal  E ENT: mild pallor, no icterus, oral mucosa moist Cardiovascular: No JVD. S1-S2 present, rhythmic, no gallops, rubs, or murmurs. No lower extremity edema. Pulmonary:  positive breath sounds bilaterally, adequate air movement, no wheezing, rhonchi or rales. Gastrointestinal. Abdomen soft and non tender Skin. No rashes Musculoskeletal: no joint deformities     Data Reviewed: I have personally reviewed following labs and imaging studies  CBC: Recent Labs  Lab 05/08/21 2208 05/10/21 0442 05/11/21 0842  WBC 7.6 6.1  --   NEUTROABS 6.4  --   --   HGB 11.8* 8.4* 8.4*  HCT 35.0* 25.9* 25.9*  MCV 91.1 94.5  --   PLT 461* 307  --    Basic Metabolic Panel: Recent Labs  Lab 05/08/21 2208 05/10/21 0442 05/11/21 0424  NA 133* 132* 134*  K 3.8 3.9 4.4  CL 98 102 99  CO2 21* 25 26  GLUCOSE 160* 92 84  BUN 25* 24* 25*  CREATININE 2.86* 3.20* 3.41*  CALCIUM 9.6 8.4* 8.6*  MG 1.5*  --   --    GFR: Estimated Creatinine Clearance: 11.8 mL/min (A) (by C-G formula based on SCr of 3.41 mg/dL (H)). Liver Function Tests: Recent Labs  Lab 05/08/21 2332  AST 21  ALT 13  ALKPHOS 143*  BILITOT 0.2*  PROT 6.9  ALBUMIN 3.5   Recent Labs  Lab 05/08/21 2332  LIPASE 28   No results for input(s): AMMONIA in the last 168 hours. Coagulation Profile: No results for input(s): INR, PROTIME in the last 168 hours. Cardiac Enzymes: No results for input(s): CKTOTAL, CKMB, CKMBINDEX, TROPONINI in the last 168 hours. BNP (last 3 results) No results for input(s): PROBNP in the last 8760 hours. HbA1C: No results for input(s): HGBA1C in the last 72 hours. CBG: No results for input(s): GLUCAP in the last 168 hours. Lipid Profile: No results for input(s): CHOL, HDL, LDLCALC, TRIG, CHOLHDL, LDLDIRECT in the last 72 hours. Thyroid Function Tests: No results for input(s): TSH, T4TOTAL, FREET4, T3FREE,  THYROIDAB in the last 72 hours. Anemia Panel: No results for input(s): VITAMINB12, FOLATE, FERRITIN, TIBC, IRON, RETICCTPCT in the last 72 hours.    Radiology Studies: I have reviewed all of the imaging during this hospital visit personally     Scheduled Meds:  amLODipine  5 mg Oral Daily   aspirin EC  81 mg Oral Daily   atorvastatin  80 mg Oral Daily   ezetimibe  10 mg Oral Daily   heparin  5,000 Units Subcutaneous Q8H   isosorbide mononitrate  30 mg Oral Daily   labetalol  100 mg Oral BID   linaclotide  72 mcg Oral QAC breakfast   mometasone-formoterol  2 puff Inhalation BID   pantoprazole  40 mg Oral BID  sodium chloride flush  3 mL Intravenous Q12H   sodium chloride flush  3 mL Intravenous Q12H   venlafaxine XR  300 mg Oral Q breakfast   Continuous Infusions:  sodium chloride     promethazine (PHENERGAN) injection (IM or IVPB) Stopped (05/09/21 0020)     LOS: 0 days        Imani Fiebelkorn Gerome Apley, MD

## 2021-05-11 NOTE — Care Management Obs Status (Signed)
[place] NOTIFICATION   Patient Details  Name: [name] MRN: [id number] Date of Birth: 1960/12/22   Medicare Observation Status Notification Given:  Yes  Permission given to sign acknowledgement of notice due to remote  Verdell Carmine, RN 05/11/2021, 1:37 PM

## 2021-05-12 ENCOUNTER — Encounter (HOSPITAL_COMMUNITY): Payer: Self-pay | Admitting: Internal Medicine

## 2021-05-12 DIAGNOSIS — N179 Acute kidney failure, unspecified: Secondary | ICD-10-CM

## 2021-05-12 DIAGNOSIS — I160 Hypertensive urgency: Secondary | ICD-10-CM

## 2021-05-12 LAB — BASIC METABOLIC PANEL
Anion gap: 7 (ref 5–15)
BUN: 28 mg/dL — ABNORMAL HIGH (ref 6–20)
CO2: 24 mmol/L (ref 22–32)
Calcium: 8.2 mg/dL — ABNORMAL LOW (ref 8.9–10.3)
Chloride: 103 mmol/L (ref 98–111)
Creatinine, Ser: 3.3 mg/dL — ABNORMAL HIGH (ref 0.44–1.00)
GFR, Estimated: 16 mL/min — ABNORMAL LOW (ref >60)
Glucose, Bld: 88 mg/dL (ref 70–99)
Potassium: 3.9 mmol/L (ref 3.5–5.1)
Sodium: 134 mmol/L — ABNORMAL LOW (ref 135–145)

## 2021-05-12 MED ORDER — ISOSORBIDE MONONITRATE ER 60 MG PO TB24
30.0000 mg | ORAL_TABLET | Freq: Every day | ORAL | Status: DC
Start: 2021-05-12 — End: 2021-06-13

## 2021-05-12 MED ORDER — OXYCODONE-ACETAMINOPHEN 5-325 MG PO TABS: 1.0000 | ORAL_TABLET | Freq: Four times a day (QID) | ORAL | 0 refills | Status: DC | PRN

## 2021-05-12 MED ORDER — ACETAMINOPHEN 500 MG PO TABS: 500.0000 mg | ORAL_TABLET | Freq: Three times a day (TID) | ORAL | 0 refills | Status: DC | PRN

## 2021-05-12 MED ORDER — VENLAFAXINE HCL ER 150 MG PO CP24: 300.0000 mg | ORAL_CAPSULE | Freq: Every day | ORAL | Status: AC

## 2021-05-12 NOTE — Progress Notes (Signed)
Pt refused this morning's Heparin subQ shot due to bleeding in abdomen from prior Heparin shots.

## 2021-05-12 NOTE — Discharge Summary (Signed)
Physician Discharge Summary  [name] WUJ:[id number] DOB: [birth year] DOA: 05/08/2021  PCP: Bernerd Limbo, MD  Admit date: 05/08/2021 Discharge date: 05/12/2021  Admitted From: Home  Disposition:   Home   Recommendations for Outpatient Follow-up and new medication changes:  Follow up with  Dr. Coletta Memos in 7 to 10 days.  Follow up renal function in 7 days.  Decreased dose of acetaminophen to 500 mg to prevent overdose.   Home Health: no   Equipment/Devices: na    Discharge Condition: stable  CODE STATUS: full  Diet recommendation:  heart healthy   Brief/Interim Summary: [name] was admitted to the hospital with the working diagnosis of chest pain to rule out ACS, complicated with AKI on CKD   60 year old female past medical history for malignant renovascular hypertension, renal artery stenosis, chronic kidney stage IV, chronic visceral ischemia, peripheral artery disease, stenosis of distal aorta, COPD, schizophrenia who presented with chest pain.  She reported 3 weeks of abdominal pain associated with intermittent nausea and vomiting.  Symptoms worsened with substernal chest pain that prompted her to come to the hospital.  On her initial physical examination blood pressure 229/115, heart rate 135, respiratory rate 23, oxygen saturation 94%, lungs were clear to auscultation bilaterally, heart S1-S2, present, rhythmic, abdomen nontender, nondistended, no lower extremity edema.   Sodium 133, potassium 3.8, chloride 98, bicarb 21, glucose 160, BUN 25, creatinine 2.8, magnesium 1.5, high sensitive troponin 37-77, white count 7.6, hemoglobin 8.8, hematocrit 35.0, platelets 461. SARS COVID-19 negative.   CT abdomen pelvis no acute changes. Chest radiograph with hyperinflation.   EKG 132 bpm, normal axis, normal intervals, sinus rhythm, poor R wave progression, ST depressions V4-V6, no significant T wave changes, positive LVH.   Patient underwent further work up with cardiac  catheterization which showed no significant stenosis.    Her blood pressure was controlled, unfortunately developed worsening renal function with a peal cr at 3,41. With supportive medical therapy now renal function improving with a serum cr down to 3,30, plan to follow up renal function as outpatient.   Atypical chest pain, hypertensive emergency.  Patient was admitted to the cardiac ward.  Her blood pressure was controlled, she had no no further chest pain. Patient ruled out for acute coronary syndrome.  Underwent further work-up with cardiac catheterization showing no no significant stenosis in the coronary arteries. Continue blood pressure control with labetalol, isosorbide and amlodipine. (Suspected medical noncompliance at home).  2.  Acute kidney injury chronic kidney disease stage IV, hyponatremia, metabolic alkalosis.  Patient received small amount of contrast during catheterization, she did have episode of hypotension. She received supportive medical therapy including intravenous fluids, her kidney function is improving. Plan to follow-up kidney function as an outpatient.  At discharge sodium 134, potassium 3.9, chloride 103, bicarb 24, glucose 88, BUN 28, creatinine 3.30.  3.  Chronic anemia.  Her hemoglobin, and hematocrit remained stable, 8.4/25.9 respectively.  4.  Peripheral vascular disease.  Dyslipidemia. Patient continue taking aspirin, lipid management with atorvastatin and ezetimibe. Clopidogrel was discontinued by Dr. Einar Gip from cardiology.  5.  Depression.  Continue venlafaxine.  6. COPD. No signs of exacerbation, continue bronchodilator therapy and inhaled corticosteroids.   7. Chronic back pain. Continue with percocet as needed (give 10 tabs prescription until follow up with primary care).  Decreased dose of acetaminophen to prevent overdose.   Discharge Diagnoses:  Principal Problem:   Chest pain, rule out acute myocardial infarction Active Problems:   CKD  (chronic kidney disease)  stage 4, GFR 15-29 ml/min (HCC)   Renovascular hypertension, malignant   Hypertensive urgency   Renal artery stenosis, native, bilateral (HCC)   Chronic mesenteric ischemia (HCC)   Abnormal EKG   AKI (acute kidney injury) [facility])    Discharge Instructions   Allergies as of 05/12/2021       Reactions   Doxycycline Anaphylaxis, Hives   Hydralazine Rash   Hydrocodone-acetaminophen Nausea And Vomiting   Iohexol Itching   Pt has itching nose after iv contrast injection        Medication List     STOP taking these medications    clopidogrel 75 MG tablet Commonly known as: PLAVIX       TAKE these medications    acetaminophen 500 MG tablet Commonly known as: TYLENOL Take 1 tablet (500 mg total) by mouth every 8 (eight) hours as needed for mild pain or headache. What changed: how much to take   albuterol 108 (90 Base) MCG/ACT inhaler Commonly known as: VENTOLIN HFA Inhale 2 puffs into the lungs every 6 (six) hours as needed for wheezing or shortness of breath (asthma).   ALPRAZolam 1 MG tablet Commonly known as: XANAX Take 0.5 tablets by mouth 2 (two) times daily as needed for anxiety.   amLODipine 10 MG tablet Commonly known as: NORVASC TAKE 1 TABLET BY MOUTH ONCE DAILY   aspirin 81 MG EC tablet Take 1 tablet (81 mg total) by mouth daily. Swallow whole.   atorvastatin 80 MG tablet Commonly known as: LIPITOR Take 80 mg by mouth daily.   azelastine 0.1 % nasal spray Commonly known as: ASTELIN Place 1 spray into both nostrils daily as needed for rhinitis or allergies.   ezetimibe 10 MG tablet Commonly known as: ZETIA TAKE 1 TABLET BY MOUTH ONCE A DAY   isosorbide mononitrate 60 MG 24 hr tablet Commonly known as: IMDUR Take 0.5 tablets (30 mg total) by mouth daily.   labetalol 100 MG tablet Commonly known as: NORMODYNE Take 1 tablet (100 mg total) by mouth 2 (two) times daily.   linaclotide 72 MCG capsule Commonly known as:  Linzess Take 1 capsule (72 mcg total) by mouth daily before breakfast.   megestrol 40 MG tablet Commonly known as: MEGACE Take 1 tablet by mouth daily as needed (appetite).   mometasone-formoterol 100-5 MCG/ACT Aero Commonly known as: DULERA Take 2 puffs first thing in am and then another 2 puffs about 12 hours later.   nitroGLYCERIN 0.4 MG SL tablet Commonly known as: NITROSTAT Place 1 tablet (0.4 mg total) under the tongue every 5 (five) minutes as needed for chest pain.   oxyCODONE-acetaminophen 5-325 MG tablet Commonly known as: PERCOCET/ROXICET Take 1 tablet by mouth every 6 (six) hours as needed for moderate pain.   pantoprazole 40 MG tablet Commonly known as: PROTONIX TAKE 1 TABLET BY MOUTH TWICE (2) DAILY What changed: See the new instructions.   promethazine 25 MG tablet Commonly known as: PHENERGAN Take 25 mg by mouth daily as needed for nausea or vomiting.   venlafaxine XR 150 MG 24 hr capsule Commonly known as: Effexor XR Take 2 capsules (300 mg total) by mouth daily with breakfast.        Follow-up Information     Cantwell, Celeste C, PA-C Follow up on 06/13/2021.   Specialty: Cardiology Why: 06/13/2021 10:00 AM. Please all your medications Contact information: [address] [place] [address] [phone]  Allergies  Allergen Reactions   Doxycycline Anaphylaxis and Hives   Hydralazine Rash   Hydrocodone-Acetaminophen Nausea And Vomiting   Iohexol Itching    Pt has itching nose after iv contrast injection    Consultations: Cardiology    Procedures/Studies: CT ABDOMEN PELVIS WO CONTRAST  Result Date: 05/09/2021 CLINICAL DATA:  Abdominal pain EXAM: CT ABDOMEN AND PELVIS WITHOUT CONTRAST TECHNIQUE: Multidetector CT imaging of the abdomen and pelvis was performed following the standard protocol without IV contrast. COMPARISON:  08/24/2020 FINDINGS: Lower chest: Emphysematous changes are noted in the lung bases. A  somewhat triangular density is noted in the left lower lobe laterally stable in appearance from the prior exam. Hepatobiliary: No focal liver abnormality is seen. No gallstones, gallbladder wall thickening, or biliary dilatation. Pancreas: Unremarkable. No pancreatic ductal dilatation or surrounding inflammatory changes. Spleen: Normal in size without focal abnormality. Adrenals/Urinary Tract: Adrenal glands are within normal limits. Left kidney is somewhat shrunken when compared with the prior exam. The right kidney appears within normal limits. No renal calculi or obstructive changes are seen. Bladder is partially distended. Stomach/Bowel: No obstructive or inflammatory changes of the colon are seen. Appendix has been surgically removed. Small bowel and stomach are within normal limits. Vascular/Lymphatic: Heavy atherosclerotic calcifications of the abdominal aorta are noted. Bi iliac stenting is noted. No sizable adenopathy is seen. Reproductive: Status post hysterectomy. No adnexal masses. Other: No abdominal wall hernia or abnormality. No abdominopelvic ascites. Musculoskeletal: No acute or significant osseous findings. IMPRESSION: No acute abnormality noted. Slight decrease in size of the left kidney when compared with the prior exam. This is likely related to renal artery stenosis. Stable nodule in the left lung base dating back to 2013. No further follow-up is recommended. Aortic Atherosclerosis (ICD10-I70.0) and Emphysema (ICD10-J43.9). Electronically Signed   By: Inez Catalina M.D.   On: 05/09/2021 02:22   DG Chest 2 View  Result Date: 05/08/2021 CLINICAL DATA:  CHEST PAIN EXAM: CHEST - 2 VIEW COMPARISON:  Radiograph 06/05/2017 FINDINGS: Chronic hyperinflation and coarsened interstitial changes are similar to comparison radiographs. No new consolidative process. No pneumothorax or visible layering effusion. Stable cardiomediastinal contours with a calcified aorta. No acute osseous or soft tissue  abnormality. Telemetry leads overlie the chest. IMPRESSION: No acute cardiopulmonary abnormality. Chronic hyperinflation, coarsened interstitial changes. Electronically Signed   By: Lovena Le M.D.   On: 05/08/2021 22:37   CARDIAC CATHETERIZATION  Result Date: 05/09/2021 Left Heart Catheterization 05/09/21: LV: 108/2, EDP 12 mmHg.  Ao 111/58, mean 106 mmHg.  No pressure gradient across the aortic valve.  LVEDP is normal.  LV gram not performed to conserve contrast. LM: Large-caliber vessel, smooth and normal. LAD: Very large caliber vessel, gives origin to a large D1 and several small diagonals.  The LAD has mild proximal coronary calcification without luminal obstruction.  The LAD and diagonal are tortuous. Circumflex: There is minimal ectasia noted in the midsegment otherwise smooth and normal.  Gives origin to a moderate-sized OM1 and a large OM 2. RCA: [place]ium has a 30% calcific stenosis.  No damping of pressure in spite of the catheter being placed deep inside the artery.  Otherwise RCA smooth and mildly tortuous. Impression: Abnormal EKG and positive cardiac markers is due to demand ischemia from hypertensive urgency.  With the same home dose medications, blood pressure is now well controlled and hence I am beginning to suspect whether compliance is an issue with medications.  I have reduced the dose of amlodipine to avoid hypotension as she  had an episode this morning. Only 10 mL of contrast was utilized and patient was administered 1 L of Ringer's lactate for hydration in view of low EDP/normal EDP (low for her).  From cardiac standpoint she can come off of Plavix as she is now having significant GI issues.  If abdominal discomfort persist, she may need GI consult.   VAS US RENAL ARTERY DUPLEX  Result Date: 05/09/2021 ABDOMINAL VISCERAL Patient Name:  [name]  Date of Exam:   05/09/2021 Medical Rec #: [id number]         Accession #:    [id number] Date of Birth: Aug 08, 1961         Patient Gender:  F Patient Age:   79 years Exam Location:  [facility] Procedure:      VAS US RENAL ARTERY DUPLEX Referring Phys: [id number] JARED M GARDNER -------------------------------------------------------------------------------- Indications: Hypertesion, abdominal pain, history of bilateral renal artery              stents, SMA stent, and bilateral common iliac artery stent. High Risk Factors: Hypertension, hyperlipidemia. Other Factors: CKD stage 4, chronic mesenteric ischemia. Comparison Study: 04-11-2021 Mesenteric complete showed patent SMA with no                   evidence of stenosis.                    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                    Right common iliac stenosis stented. Post stenting perforation                   with extravasation in the RCIA as well as ulceration in the                   aortic stent. Elected to cover with terminal aortic stenting                   and bilateral common iliac artery stenting.                    06-15-2020 PCV Renal/Renal Artery Duplex showed no evidence of                   renal artery occlusive disease in eitehr renal artery. Mild                   increase in right vascular RI. Normal intrarenal vascular                   perfusion noted in the left kidney. Performing Technologist: Darlin Coco RDMS,RVT  Examination Guidelines: A complete evaluation includes B-mode imaging, spectral Doppler, color Doppler, and power Doppler as needed of all accessible portions of each vessel. Bilateral testing is considered an integral part of a complete examination. Limited examinations for reoccurring indications may be performed as noted.  Duplex Findings: +--------------------+--------+--------+------+----------------+ Mesenteric  PSV cm/sEDV  cm/sPlaque    Comments     +--------------------+--------+--------+------+----------------+ Aorta Mid             127                                  +--------------------+--------+--------+------+----------------+ Celiac Artery Origin  220                                  +--------------------+--------+--------+------+----------------+ SMA Origin            179      46                          +--------------------+--------+--------+------+----------------+ SMA Proximal          220      59         Patent SMA stent +--------------------+--------+--------+------+----------------+ SMA Mid               149      49                          +--------------------+--------+--------+------+----------------+ SMA Distal             94      33                          +--------------------+--------+--------+------+----------------+    +------------------+--------+--------+-------+ Right Renal ArteryPSV cm/sEDV cm/sComment +------------------+--------+--------+-------+ Origin               86      24           +------------------+--------+--------+-------+ Proximal            104      26           +------------------+--------+--------+-------+ Mid                  86      28           +------------------+--------+--------+-------+ Distal              123      38           +------------------+--------+--------+-------+ +-----------------+--------+--------+--------+ Left Renal ArteryPSV cm/sEDV cm/sComment  +-----------------+--------+--------+--------+ Origin              0       0    Occluded +-----------------+--------+--------+--------+ Proximal            0       0    Occluded +-----------------+--------+--------+--------+ Mid                 0       0    Occluded +-----------------+--------+--------+--------+ Distal              0       0    Occluded +-----------------+--------+--------+--------+  +------------+--------+--------+----+-----------+--------+--------+---+ Right KidneyPSV cm/sEDV cm/sRI  Left KidneyPSV cm/sEDV cm/sRI  +------------+--------+--------+----+-----------+--------+--------+---+ Upper Pole  21      7       0.67Upper Pole                     +------------+--------+--------+----+-----------+--------+--------+---+ Mid         33      10  0.70Mid        0       0           +------------+--------+--------+----+-----------+--------+--------+---+ Lower Pole  20      7       0.66Lower Pole                     +------------+--------+--------+----+-----------+--------+--------+---+ Hilar       117     34      0.71Hilar      0       0           +------------+--------+--------+----+-----------+--------+--------+---+ +------------------+-----+------------------+--------------------+ Right Kidney           Left Kidney                            +------------------+-----+------------------+--------------------+ RAR                    RAR                                    +------------------+-----+------------------+--------------------+ RAR (manual)      0.9  RAR (manual)      LRA appears occluded +------------------+-----+------------------+--------------------+ Cortex                 Cortex                                 +------------------+-----+------------------+--------------------+ Cortex thickness       Corex thickness                        +------------------+-----+------------------+--------------------+ Kidney length (cm)10.23Kidney length (cm)6.65                 +------------------+-----+------------------+--------------------+  Summary: Renal:  Right: Normal size right kidney. Mildly elevated RI No evidence of        right renal artery stenosis. RRV flow present. Left:  Left renal artery appears occluded. No perfusion observed to        left kidney. Abnormal size for the left kidney. Abnormal        cortical  thickness of the left kidney. Atrophic appearance. Mesenteric:  Stented SMA appears patent. Velocities appear essentially unchanged compared to previous examination on 04-11-2021. 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  *See table(s) above for measurements and observations.  Diagnosing physician: Ruta Hinds MD  Electronically signed by Ruta Hinds MD on 05/09/2021 at 5:29:20 PM.    Final      Procedures left heart cath   Subjective: Patient is feeling better, no nausea or vomiting and tolerating po well.   Discharge Exam: Vitals:   05/11/21 2012 05/12/21 0303  BP:  (!) 150/73  Pulse:  (!) 59  Resp:  16  Temp:  98.6 F (37 C)  SpO2: 96% 96%   Vitals:   05/11/21 1936 05/11/21 2012 05/12/21 0027 05/12/21 0303  BP: (!) 155/71   (!) 150/73  Pulse: 73   (!) 59  Resp: 16   16  Temp: 98.9 F (37.2 C)   98.6 F (37 C)  TempSrc: Oral   Oral  SpO2: 95% 96%  96%  Weight:   41.8 kg   Height:  General: Not in pain or dyspnea  Neurology: Awake and alert, non focal  E ENT: no pallor, no icterus, oral mucosa moist Cardiovascular: No JVD. S1-S2 present, rhythmic, no gallops, rubs, or murmurs. No lower extremity edema. Pulmonary: positive breath sounds bilaterally, adequate air movement, no wheezing, rhonchi or rales. Gastrointestinal. Abdomen soft and non tender Skin. No rashes Musculoskeletal: no joint deformities   The results of significant diagnostics from this hospitalization (including imaging, microbiology, ancillary and laboratory) are listed below for reference.     Microbiology: Recent Results (from the past 240 hour(s))  Resp Panel by RT-PCR (Flu A&B, Covid) Nasopharyngeal Swab     Status: None   Collection Time: 05/09/21  4:56 AM   Specimen: Nasopharyngeal Swab; Nasopharyngeal(NP) swabs in vial transport medium  Result Value Ref Range Status   SARS Coronavirus 2 by RT PCR NEGATIVE NEGATIVE Final     Comment: (NOTE) SARS-CoV-2 target nucleic acids are NOT DETECTED.  The SARS-CoV-2 RNA is generally detectable in upper respiratory specimens during the acute phase of infection. The lowest concentration of SARS-CoV-2 viral copies this assay can detect is 138 copies/mL. A negative result does not preclude SARS-Cov-2 infection and should not be used as the sole basis for treatment or other patient management decisions. A negative result may occur with  improper specimen collection/handling, submission of specimen other than nasopharyngeal swab, presence of viral mutation(s) within the areas targeted by this assay, and inadequate number of viral copies(<138 copies/mL). A negative result must be combined with clinical observations, patient history, and epidemiological information. The expected result is Negative.  Fact Sheet for Patients:  EntrepreneurPulse.com.au  Fact Sheet for Healthcare Providers:  IncredibleEmployment.be  This test is no t yet approved or cleared by the [place] FDA and  has been authorized for detection and/or diagnosis of SARS-CoV-2 by FDA under an Emergency Use Authorization (EUA). This EUA will remain  in effect (meaning this test can be used) for the duration of the COVID-19 declaration under Section 564(b)(1) of the Act, 21 U.S.C.section 360bbb-3(b)(1), unless the authorization is terminated  or revoked sooner.       Influenza A by PCR NEGATIVE NEGATIVE Final   Influenza B by PCR NEGATIVE NEGATIVE Final    Comment: (NOTE) The Xpert Xpress SARS-CoV-2/FLU/RSV plus assay is intended as an aid in the diagnosis of influenza from Nasopharyngeal swab specimens and should not be used as a sole basis for treatment. Nasal washings and aspirates are unacceptable for Xpert Xpress SARS-CoV-2/FLU/RSV testing.  Fact Sheet for Patients: EntrepreneurPulse.com.au  Fact Sheet for Healthcare  Providers: IncredibleEmployment.be  This test is not yet approved or cleared by the [place] FDA and has been authorized for detection and/or diagnosis of SARS-CoV-2 by FDA under an Emergency Use Authorization (EUA). This EUA will remain in effect (meaning this test can be used) for the duration of the COVID-19 declaration under Section 564(b)(1) of the Act, 21 U.S.C. section 360bbb-3(b)(1), unless the authorization is terminated or revoked.  Performed at [facility], [place] [address], [place],  [address]      Labs: BNP (last 3 results) No results for input(s): BNP in the last 8760 hours. Basic Metabolic Panel: Recent Labs  Lab 05/08/21 2208 05/10/21 0442 05/11/21 0424 05/12/21 0216  NA 133* 132* 134* 134*  K 3.8 3.9 4.4 3.9  CL 98 102 99 103  CO2 21* 25 26 24   GLUCOSE 160* 92 84 88  BUN 25* 24* 25* 28*  CREATININE 2.86* 3.20* 3.41* 3.30*  CALCIUM 9.6 8.4* 8.6* 8.2*  MG 1.5*  --   --   --    Liver Function Tests: Recent Labs  Lab 05/08/21 2332  AST 21  ALT 13  ALKPHOS 143*  BILITOT 0.2*  PROT 6.9  ALBUMIN 3.5   Recent Labs  Lab 05/08/21 2332  LIPASE 28   No results for input(s): AMMONIA in the last 168 hours. CBC: Recent Labs  Lab 05/08/21 2208 05/10/21 0442 05/11/21 0842  WBC 7.6 6.1  --   NEUTROABS 6.4  --   --   HGB 11.8* 8.4* 8.4*  HCT 35.0* 25.9* 25.9*  MCV 91.1 94.5  --   PLT 461* 307  --    Cardiac Enzymes: No results for input(s): CKTOTAL, CKMB, CKMBINDEX, TROPONINI in the last 168 hours. BNP: Invalid input(s): POCBNP CBG: No results for input(s): GLUCAP in the last 168 hours. D-Dimer No results for input(s): DDIMER in the last 72 hours. Hgb A1c No results for input(s): HGBA1C in the last 72 hours. Lipid Profile No results for input(s): CHOL, HDL, LDLCALC, TRIG, CHOLHDL, LDLDIRECT in the last 72 hours. Thyroid function studies No results for input(s): TSH, T4TOTAL, T3FREE, THYROIDAB in the last  72 hours.  Invalid input(s): FREET3 Anemia work up No results for input(s): VITAMINB12, FOLATE, FERRITIN, TIBC, IRON, RETICCTPCT in the last 72 hours. Urinalysis    Component Value Date/Time   COLORURINE YELLOW 08/05/2020 0640   APPEARANCEUR Cloudy (A) 03/22/2021 1432   LABSPEC 1.014 08/05/2020 0640   LABSPEC 1.016 06/07/2012 1350   PHURINE 5.0 08/05/2020 0640   GLUCOSEU Negative 03/22/2021 1432   GLUCOSEU Negative 06/07/2012 1350   HGBUR NEGATIVE 08/05/2020 0640   BILIRUBINUR Negative 03/22/2021 1432   BILIRUBINUR Negative 06/07/2012 1350   KETONESUR NEGATIVE 08/05/2020 0640   PROTEINUR 3+ (A) 03/22/2021 1432   PROTEINUR >=300 (A) 08/05/2020 0640   UROBILINOGEN 0.2 06/28/2014 1518   NITRITE Negative 03/22/2021 1432   NITRITE NEGATIVE 08/05/2020 0640   LEUKOCYTESUR Negative 03/22/2021 1432   LEUKOCYTESUR NEGATIVE 08/05/2020 0640   LEUKOCYTESUR Negative 06/07/2012 1350   Sepsis Labs Invalid input(s): PROCALCITONIN,  WBC,  LACTICIDVEN Microbiology Recent Results (from the past 240 hour(s))  Resp Panel by RT-PCR (Flu A&B, Covid) Nasopharyngeal Swab     Status: None   Collection Time: 05/09/21  4:56 AM   Specimen: Nasopharyngeal Swab; Nasopharyngeal(NP) swabs in vial transport medium  Result Value Ref Range Status   SARS Coronavirus 2 by RT PCR NEGATIVE NEGATIVE Final    Comment: (NOTE) SARS-CoV-2 target nucleic acids are NOT DETECTED.  The SARS-CoV-2 RNA is generally detectable in upper respiratory specimens during the acute phase of infection. The lowest concentration of SARS-CoV-2 viral copies this assay can detect is 138 copies/mL. A negative result does not preclude SARS-Cov-2 infection and should not be used as the sole basis for treatment or other patient management decisions. A negative result may occur with  improper specimen collection/handling, submission of specimen other than nasopharyngeal swab, presence of viral mutation(s) within the areas targeted by this  assay, and inadequate number of viral copies(<138 copies/mL). A negative result must be combined with clinical observations, patient history, and epidemiological information. The expected result is Negative.  Fact Sheet for Patients:  EntrepreneurPulse.com.au  Fact Sheet for Healthcare Providers:  IncredibleEmployment.be  This test is no t yet approved or cleared by the [place] FDA and  has been authorized for detection and/or diagnosis of SARS-CoV-2 by FDA under an Emergency Use Authorization (EUA). This EUA will  remain  in effect (meaning this test can be used) for the duration of the COVID-19 declaration under Section 564(b)(1) of the Act, 21 U.S.C.section 360bbb-3(b)(1), unless the authorization is terminated  or revoked sooner.       Influenza A by PCR NEGATIVE NEGATIVE Final   Influenza B by PCR NEGATIVE NEGATIVE Final    Comment: (NOTE) The Xpert Xpress SARS-CoV-2/FLU/RSV plus assay is intended as an aid in the diagnosis of influenza from Nasopharyngeal swab specimens and should not be used as a sole basis for treatment. Nasal washings and aspirates are unacceptable for Xpert Xpress SARS-CoV-2/FLU/RSV testing.  Fact Sheet for Patients: EntrepreneurPulse.com.au  Fact Sheet for Healthcare Providers: IncredibleEmployment.be  This test is not yet approved or cleared by the [place] FDA and has been authorized for detection and/or diagnosis of SARS-CoV-2 by FDA under an Emergency Use Authorization (EUA). This EUA will remain in effect (meaning this test can be used) for the duration of the COVID-19 declaration under Section 564(b)(1) of the Act, 21 U.S.C. section 360bbb-3(b)(1), unless the authorization is terminated or revoked.  Performed at [facility], [place] [address], [place], [place] [address]      Time coordinating discharge: 45 minutes  SIGNED:   Tawni Millers, MD  [facility] 05/12/2021, 8:20 AM

## 2021-05-12 NOTE — Progress Notes (Signed)
Patient discharged to home. AVS reviewed, all questions answered. Patient confirmed she had all of her belongings and prescriptions were sent to the correct pharmacy. IV removed.  Security assisted patient with getting to her car at urgent care.

## 2021-05-22 ENCOUNTER — Ambulatory Visit: Payer: Medicare Other

## 2021-05-22 ENCOUNTER — Other Ambulatory Visit: Payer: Self-pay

## 2021-05-22 DIAGNOSIS — R0602 Shortness of breath: Secondary | ICD-10-CM

## 2021-05-22 LAB — PCV MYOCARDIAL PERFUSION WITH LEXISCAN: MPHR: 162 {beats}/min

## 2021-05-23 NOTE — Progress Notes (Signed)
Called patient, she will try some pain medications for now and will call us if that does not help.

## 2021-05-23 NOTE — Progress Notes (Signed)
She may try pain relief medication, the chest pain is not cardiac because he recent cath did not show obstruction of heart arteries.

## 2021-05-23 NOTE — Progress Notes (Signed)
Called and spoke with patient regarding her recent stress test result. Patient stated that after her stress test yesterday, she has not been feeling good. She is having headaches and some mild chest pains.

## 2021-05-23 NOTE — Progress Notes (Signed)
Low risk will discuss further at next office visit.

## 2021-06-09 ENCOUNTER — Ambulatory Visit: Payer: Medicare Other | Admitting: Student

## 2021-06-10 ENCOUNTER — Encounter (HOSPITAL_COMMUNITY): Payer: Self-pay | Admitting: Emergency Medicine

## 2021-06-10 ENCOUNTER — Emergency Department (HOSPITAL_COMMUNITY): Payer: Medicare Other

## 2021-06-10 ENCOUNTER — Telehealth: Payer: Self-pay | Admitting: Nurse Practitioner

## 2021-06-10 ENCOUNTER — Inpatient Hospital Stay (HOSPITAL_COMMUNITY)
Admission: EM | Admit: 2021-06-10 | Discharge: 2021-06-13 | DRG: 377 | Disposition: A | Discharge status: Home / Self Care | Payer: Medicare Other | Attending: Internal Medicine | Admitting: Internal Medicine

## 2021-06-10 ENCOUNTER — Encounter (HOSPITAL_COMMUNITY): Payer: Self-pay

## 2021-06-10 ENCOUNTER — Emergency Department (HOSPITAL_COMMUNITY)
Admission: EM | Admit: 2021-06-10 | Discharge: 2021-06-10 | Disposition: A | Discharge status: Home / Self Care | Payer: Medicare Other | Source: Home / Self Care

## 2021-06-10 ENCOUNTER — Other Ambulatory Visit: Payer: Self-pay

## 2021-06-10 DIAGNOSIS — G2581 Restless legs syndrome: Secondary | ICD-10-CM | POA: Diagnosis present

## 2021-06-10 DIAGNOSIS — I252 Old myocardial infarction: Secondary | ICD-10-CM | POA: Diagnosis not present

## 2021-06-10 DIAGNOSIS — K5790 Diverticulosis of intestine, part unspecified, without perforation or abscess without bleeding: Secondary | ICD-10-CM

## 2021-06-10 DIAGNOSIS — D631 Anemia in chronic kidney disease: Secondary | ICD-10-CM | POA: Diagnosis present

## 2021-06-10 DIAGNOSIS — K222 Esophageal obstruction: Secondary | ICD-10-CM | POA: Diagnosis present

## 2021-06-10 DIAGNOSIS — T82856A Stenosis of peripheral vascular stent, initial encounter: Secondary | ICD-10-CM | POA: Diagnosis present

## 2021-06-10 DIAGNOSIS — Z681 Body mass index (BMI) 19 or less, adult: Secondary | ICD-10-CM | POA: Diagnosis not present

## 2021-06-10 DIAGNOSIS — F17210 Nicotine dependence, cigarettes, uncomplicated: Secondary | ICD-10-CM | POA: Diagnosis present

## 2021-06-10 DIAGNOSIS — I160 Hypertensive urgency: Secondary | ICD-10-CM | POA: Diagnosis present

## 2021-06-10 DIAGNOSIS — Z90710 Acquired absence of both cervix and uterus: Secondary | ICD-10-CM

## 2021-06-10 DIAGNOSIS — K5791 Diverticulosis of intestine, part unspecified, without perforation or abscess with bleeding: Secondary | ICD-10-CM | POA: Diagnosis present

## 2021-06-10 DIAGNOSIS — E785 Hyperlipidemia, unspecified: Secondary | ICD-10-CM | POA: Diagnosis present

## 2021-06-10 DIAGNOSIS — M1990 Unspecified osteoarthritis, unspecified site: Secondary | ICD-10-CM | POA: Diagnosis present

## 2021-06-10 DIAGNOSIS — Z95828 Presence of other vascular implants and grafts: Secondary | ICD-10-CM

## 2021-06-10 DIAGNOSIS — H409 Unspecified glaucoma: Secondary | ICD-10-CM | POA: Diagnosis present

## 2021-06-10 DIAGNOSIS — D62 Acute posthemorrhagic anemia: Secondary | ICD-10-CM | POA: Diagnosis present

## 2021-06-10 DIAGNOSIS — I350 Nonrheumatic aortic (valve) stenosis: Secondary | ICD-10-CM | POA: Diagnosis present

## 2021-06-10 DIAGNOSIS — N179 Acute kidney failure, unspecified: Secondary | ICD-10-CM | POA: Diagnosis present

## 2021-06-10 DIAGNOSIS — Z20822 Contact with and (suspected) exposure to covid-19: Secondary | ICD-10-CM | POA: Diagnosis present

## 2021-06-10 DIAGNOSIS — Z885 Allergy status to narcotic agent status: Secondary | ICD-10-CM

## 2021-06-10 DIAGNOSIS — I739 Peripheral vascular disease, unspecified: Secondary | ICD-10-CM | POA: Diagnosis present

## 2021-06-10 DIAGNOSIS — E43 Unspecified severe protein-calorie malnutrition: Secondary | ICD-10-CM | POA: Diagnosis present

## 2021-06-10 DIAGNOSIS — K921 Melena: Secondary | ICD-10-CM | POA: Diagnosis not present

## 2021-06-10 DIAGNOSIS — K219 Gastro-esophageal reflux disease without esophagitis: Secondary | ICD-10-CM | POA: Diagnosis present

## 2021-06-10 DIAGNOSIS — Z9114 Patient's other noncompliance with medication regimen: Secondary | ICD-10-CM

## 2021-06-10 DIAGNOSIS — Z9049 Acquired absence of other specified parts of digestive tract: Secondary | ICD-10-CM

## 2021-06-10 DIAGNOSIS — Z716 Tobacco abuse counseling: Secondary | ICD-10-CM

## 2021-06-10 DIAGNOSIS — I120 Hypertensive chronic kidney disease with stage 5 chronic kidney disease or end stage renal disease: Secondary | ICD-10-CM | POA: Diagnosis present

## 2021-06-10 DIAGNOSIS — E876 Hypokalemia: Secondary | ICD-10-CM | POA: Diagnosis present

## 2021-06-10 DIAGNOSIS — Z8541 Personal history of malignant neoplasm of cervix uteri: Secondary | ICD-10-CM

## 2021-06-10 DIAGNOSIS — R195 Other fecal abnormalities: Secondary | ICD-10-CM | POA: Diagnosis not present

## 2021-06-10 DIAGNOSIS — Z560 Unemployment, unspecified: Secondary | ICD-10-CM

## 2021-06-10 DIAGNOSIS — K625 Hemorrhage of anus and rectum: Secondary | ICD-10-CM | POA: Insufficient documentation

## 2021-06-10 DIAGNOSIS — K922 Gastrointestinal hemorrhage, unspecified: Secondary | ICD-10-CM

## 2021-06-10 DIAGNOSIS — F319 Bipolar disorder, unspecified: Secondary | ICD-10-CM | POA: Diagnosis present

## 2021-06-10 DIAGNOSIS — N185 Chronic kidney disease, stage 5: Secondary | ICD-10-CM | POA: Diagnosis present

## 2021-06-10 DIAGNOSIS — I2510 Atherosclerotic heart disease of native coronary artery without angina pectoris: Secondary | ICD-10-CM | POA: Diagnosis present

## 2021-06-10 DIAGNOSIS — R1084 Generalized abdominal pain: Secondary | ICD-10-CM | POA: Insufficient documentation

## 2021-06-10 DIAGNOSIS — Z7982 Long term (current) use of aspirin: Secondary | ICD-10-CM

## 2021-06-10 DIAGNOSIS — G8929 Other chronic pain: Secondary | ICD-10-CM | POA: Diagnosis present

## 2021-06-10 DIAGNOSIS — K551 Chronic vascular disorders of intestine: Secondary | ICD-10-CM | POA: Diagnosis present

## 2021-06-10 DIAGNOSIS — Y838 Other surgical procedures as the cause of abnormal reaction of the patient, or of later complication, without mention of misadventure at the time of the procedure: Secondary | ICD-10-CM | POA: Diagnosis present

## 2021-06-10 DIAGNOSIS — Z90722 Acquired absence of ovaries, bilateral: Secondary | ICD-10-CM

## 2021-06-10 DIAGNOSIS — Z888 Allergy status to other drugs, medicaments and biological substances status: Secondary | ICD-10-CM

## 2021-06-10 DIAGNOSIS — Z5321 Procedure and treatment not carried out due to patient leaving prior to being seen by health care provider: Secondary | ICD-10-CM | POA: Insufficient documentation

## 2021-06-10 DIAGNOSIS — Z79899 Other long term (current) drug therapy: Secondary | ICD-10-CM

## 2021-06-10 DIAGNOSIS — J449 Chronic obstructive pulmonary disease, unspecified: Secondary | ICD-10-CM | POA: Diagnosis present

## 2021-06-10 DIAGNOSIS — Z8719 Personal history of other diseases of the digestive system: Secondary | ICD-10-CM

## 2021-06-10 DIAGNOSIS — T457X5S Adverse effect of anticoagulant antagonists, vitamin K and other coagulants, sequela: Secondary | ICD-10-CM

## 2021-06-10 DIAGNOSIS — Z881 Allergy status to other antibiotic agents status: Secondary | ICD-10-CM

## 2021-06-10 DIAGNOSIS — F419 Anxiety disorder, unspecified: Secondary | ICD-10-CM | POA: Diagnosis present

## 2021-06-10 DIAGNOSIS — Z7951 Long term (current) use of inhaled steroids: Secondary | ICD-10-CM

## 2021-06-10 DIAGNOSIS — Z955 Presence of coronary angioplasty implant and graft: Secondary | ICD-10-CM

## 2021-06-10 DIAGNOSIS — Z85828 Personal history of other malignant neoplasm of skin: Secondary | ICD-10-CM

## 2021-06-10 DIAGNOSIS — Z91041 Radiographic dye allergy status: Secondary | ICD-10-CM

## 2021-06-10 DIAGNOSIS — K449 Diaphragmatic hernia without obstruction or gangrene: Secondary | ICD-10-CM | POA: Diagnosis present

## 2021-06-10 LAB — COMPREHENSIVE METABOLIC PANEL
ALT: 15 U/L (ref 0–44)
ALT: 14 U/L (ref 0–44)
AST: 21 U/L (ref 15–41)
AST: 21 U/L (ref 15–41)
Albumin: 2.6 g/dL — ABNORMAL LOW (ref 3.5–5.0)
Albumin: 2.9 g/dL — ABNORMAL LOW (ref 3.5–5.0)
Alkaline Phosphatase: 126 U/L (ref 38–126)
Alkaline Phosphatase: 136 U/L — ABNORMAL HIGH (ref 38–126)
Anion gap: 10 (ref 5–15)
Anion gap: 13 (ref 5–15)
BUN: 42 mg/dL — ABNORMAL HIGH (ref 6–20)
BUN: 45 mg/dL — ABNORMAL HIGH (ref 6–20)
CO2: 22 mmol/L (ref 22–32)
CO2: 22 mmol/L (ref 22–32)
Calcium: 8.1 mg/dL — ABNORMAL LOW (ref 8.9–10.3)
Calcium: 8.2 mg/dL — ABNORMAL LOW (ref 8.9–10.3)
Chloride: 101 mmol/L (ref 98–111)
Chloride: 98 mmol/L (ref 98–111)
Creatinine, Ser: 4.33 mg/dL — ABNORMAL HIGH (ref 0.44–1.00)
Creatinine, Ser: 4.15 mg/dL — ABNORMAL HIGH (ref 0.44–1.00)
GFR, Estimated: 11 mL/min — ABNORMAL LOW (ref >60)
GFR, Estimated: 12 mL/min — ABNORMAL LOW (ref >60)
Glucose, Bld: 109 mg/dL — ABNORMAL HIGH (ref 70–99)
Glucose, Bld: 117 mg/dL — ABNORMAL HIGH (ref 70–99)
Potassium: 3.7 mmol/L (ref 3.5–5.1)
Potassium: 3.1 mmol/L — ABNORMAL LOW (ref 3.5–5.1)
Sodium: 133 mmol/L — ABNORMAL LOW (ref 135–145)
Sodium: 133 mmol/L — ABNORMAL LOW (ref 135–145)
Total Bilirubin: 0.5 mg/dL (ref 0.3–1.2)
Total Bilirubin: 0.5 mg/dL (ref 0.3–1.2)
Total Protein: 5.7 g/dL — ABNORMAL LOW (ref 6.5–8.1)
Total Protein: 6.3 g/dL — ABNORMAL LOW (ref 6.5–8.1)

## 2021-06-10 LAB — CBC WITH DIFFERENTIAL/PLATELET
Abs Immature Granulocytes: 0.01 10*3/uL (ref 0.00–0.07)
Basophils Absolute: 0.1 10*3/uL (ref 0.0–0.1)
Basophils Relative: 1 %
Eosinophils Absolute: 0.4 10*3/uL (ref 0.0–0.5)
Eosinophils Relative: 6 %
HCT: 22.5 % — ABNORMAL LOW (ref 36.0–46.0)
Hemoglobin: 7.5 g/dL — ABNORMAL LOW (ref 12.0–15.0)
Immature Granulocytes: 0 %
Lymphocytes Relative: 21 %
Lymphs Abs: 1.5 10*3/uL (ref 0.7–4.0)
MCH: 30.9 pg (ref 26.0–34.0)
MCHC: 33.3 g/dL (ref 30.0–36.0)
MCV: 92.6 fL (ref 80.0–100.0)
Monocytes Absolute: 0.6 10*3/uL (ref 0.1–1.0)
Monocytes Relative: 8 %
Neutro Abs: 4.7 10*3/uL (ref 1.7–7.7)
Neutrophils Relative %: 64 %
Platelets: 337 10*3/uL (ref 150–400)
RBC: 2.43 MIL/uL — ABNORMAL LOW (ref 3.87–5.11)
RDW: 16.3 % — ABNORMAL HIGH (ref 11.5–15.5)
WBC: 7.3 10*3/uL (ref 4.0–10.5)
nRBC: 0 % (ref 0.0–0.2)

## 2021-06-10 LAB — CBC
HCT: 22.9 % — ABNORMAL LOW (ref 36.0–46.0)
Hemoglobin: 7.4 g/dL — ABNORMAL LOW (ref 12.0–15.0)
MCH: 29.7 pg (ref 26.0–34.0)
MCHC: 32.3 g/dL (ref 30.0–36.0)
MCV: 92 fL (ref 80.0–100.0)
Platelets: 347 10*3/uL (ref 150–400)
RBC: 2.49 MIL/uL — ABNORMAL LOW (ref 3.87–5.11)
RDW: 15.9 % — ABNORMAL HIGH (ref 11.5–15.5)
WBC: 7 10*3/uL (ref 4.0–10.5)
nRBC: 0 % (ref 0.0–0.2)

## 2021-06-10 LAB — I-STAT BETA HCG BLOOD, ED (MC, WL, AP ONLY): I-stat hCG, quantitative: 22.9 m[IU]/mL — ABNORMAL HIGH (ref <5)

## 2021-06-10 LAB — PROTIME-INR
INR: 0.9 (ref 0.8–1.2)
Prothrombin Time: 12 seconds (ref 11.4–15.2)

## 2021-06-10 LAB — TYPE AND SCREEN
ABO/RH(D): O NEG
Antibody Screen: NEGATIVE

## 2021-06-10 LAB — TROPONIN I (HIGH SENSITIVITY)
Troponin I (High Sensitivity): 17 ng/L (ref <18)
Troponin I (High Sensitivity): 18 ng/L — ABNORMAL HIGH (ref <18)

## 2021-06-10 LAB — MRSA NEXT GEN BY PCR, NASAL: MRSA by PCR Next Gen: NOT DETECTED

## 2021-06-10 LAB — RESP PANEL BY RT-PCR (FLU A&B, COVID) ARPGX2
Influenza A by PCR: NEGATIVE
Influenza B by PCR: NEGATIVE
SARS Coronavirus 2 by RT PCR: NEGATIVE

## 2021-06-10 LAB — LIPASE, BLOOD: Lipase: 40 U/L (ref 11–51)

## 2021-06-10 LAB — LACTIC ACID, PLASMA: Lactic Acid, Venous: 1.6 mmol/L (ref 0.5–1.9)

## 2021-06-10 MED ORDER — MORPHINE SULFATE (PF) 4 MG/ML IV SOLN
4.0000 mg | Freq: Once | INTRAVENOUS | Status: AC
  Administered 2021-06-10: 4 mg via INTRAVENOUS
  Filled 2021-06-10: qty 1

## 2021-06-10 MED ORDER — LABETALOL HCL 5 MG/ML IV SOLN
10.0000 mg | Freq: Four times a day (QID) | INTRAVENOUS | Status: DC | PRN
  Administered 2021-06-11 – 2021-06-13 (×5): 10 mg via INTRAVENOUS
  Filled 2021-06-10 (×5): qty 4

## 2021-06-10 MED ORDER — VENLAFAXINE HCL ER 150 MG PO CP24
300.0000 mg | ORAL_CAPSULE | Freq: Every day | ORAL | Status: DC
  Filled 2021-06-10: qty 2

## 2021-06-10 MED ORDER — ATORVASTATIN CALCIUM 40 MG PO TABS
80.0000 mg | ORAL_TABLET | Freq: Every morning | ORAL | Status: DC
  Administered 2021-06-11 – 2021-06-13 (×3): 80 mg via ORAL
  Filled 2021-06-10 (×3): qty 2

## 2021-06-10 MED ORDER — ALBUTEROL SULFATE (2.5 MG/3ML) 0.083% IN NEBU
2.5000 mg | INHALATION_SOLUTION | Freq: Four times a day (QID) | RESPIRATORY_TRACT | Status: DC
  Administered 2021-06-10: 2.5 mg via RESPIRATORY_TRACT
  Filled 2021-06-10: qty 3

## 2021-06-10 MED ORDER — CHLORHEXIDINE GLUCONATE CLOTH 2 % EX PADS
6.0000 | MEDICATED_PAD | Freq: Every day | CUTANEOUS | Status: DC
  Administered 2021-06-10 – 2021-06-12 (×3): 6 via TOPICAL

## 2021-06-10 MED ORDER — EZETIMIBE 10 MG PO TABS
10.0000 mg | ORAL_TABLET | Freq: Every morning | ORAL | Status: DC
  Administered 2021-06-11 – 2021-06-13 (×3): 10 mg via ORAL
  Filled 2021-06-10 (×3): qty 1

## 2021-06-10 MED ORDER — ALBUTEROL SULFATE HFA 108 (90 BASE) MCG/ACT IN AERS: 2.0000 | INHALATION_SPRAY | Freq: Four times a day (QID) | RESPIRATORY_TRACT | Status: DC | PRN

## 2021-06-10 MED ORDER — ACETAMINOPHEN 650 MG RE SUPP: 650.0000 mg | Freq: Four times a day (QID) | RECTAL | Status: DC | PRN

## 2021-06-10 MED ORDER — ONDANSETRON HCL 4 MG/2ML IJ SOLN
4.0000 mg | Freq: Once | INTRAMUSCULAR | Status: AC
  Administered 2021-06-10: 4 mg via INTRAVENOUS
  Filled 2021-06-10: qty 2

## 2021-06-10 MED ORDER — LABETALOL HCL 100 MG PO TABS
100.0000 mg | ORAL_TABLET | Freq: Two times a day (BID) | ORAL | Status: DC
  Administered 2021-06-10 – 2021-06-13 (×6): 100 mg via ORAL
  Filled 2021-06-10 (×7): qty 1

## 2021-06-10 MED ORDER — POTASSIUM CHLORIDE CRYS ER 20 MEQ PO TBCR
40.0000 meq | EXTENDED_RELEASE_TABLET | Freq: Once | ORAL | Status: AC
  Administered 2021-06-10: 40 meq via ORAL
  Filled 2021-06-10: qty 2

## 2021-06-10 MED ORDER — ISOSORBIDE MONONITRATE ER 60 MG PO TB24
30.0000 mg | ORAL_TABLET | Freq: Every morning | ORAL | Status: DC
  Administered 2021-06-10 – 2021-06-11 (×2): 30 mg via ORAL
  Filled 2021-06-10 (×2): qty 1

## 2021-06-10 MED ORDER — SODIUM CHLORIDE 0.9 % IV SOLN: INTRAVENOUS | Status: DC

## 2021-06-10 MED ORDER — ALBUTEROL SULFATE (2.5 MG/3ML) 0.083% IN NEBU: 2.5000 mg | INHALATION_SOLUTION | Freq: Four times a day (QID) | RESPIRATORY_TRACT | Status: DC

## 2021-06-10 MED ORDER — AMLODIPINE BESYLATE 10 MG PO TABS
10.0000 mg | ORAL_TABLET | Freq: Every morning | ORAL | Status: DC
  Administered 2021-06-10 – 2021-06-13 (×4): 10 mg via ORAL
  Filled 2021-06-10 (×2): qty 1
  Filled 2021-06-10: qty 2
  Filled 2021-06-10: qty 1

## 2021-06-10 MED ORDER — ALPRAZOLAM 0.5 MG PO TABS
0.5000 mg | ORAL_TABLET | Freq: Two times a day (BID) | ORAL | Status: DC
  Administered 2021-06-10 – 2021-06-13 (×6): 1 mg via ORAL
  Filled 2021-06-10 (×6): qty 2

## 2021-06-10 MED ORDER — ORAL CARE MOUTH RINSE
15.0000 mL | Freq: Two times a day (BID) | OROMUCOSAL | Status: DC
  Administered 2021-06-11 – 2021-06-13 (×4): 15 mL via OROMUCOSAL

## 2021-06-10 MED ORDER — ALBUTEROL SULFATE (2.5 MG/3ML) 0.083% IN NEBU
2.5000 mg | INHALATION_SOLUTION | Freq: Four times a day (QID) | RESPIRATORY_TRACT | Status: DC | PRN
  Administered 2021-06-11: 2.5 mg via RESPIRATORY_TRACT
  Filled 2021-06-10: qty 3

## 2021-06-10 MED ORDER — ACETAMINOPHEN 325 MG PO TABS: 650.0000 mg | ORAL_TABLET | Freq: Four times a day (QID) | ORAL | Status: DC | PRN

## 2021-06-10 MED ORDER — OXYCODONE-ACETAMINOPHEN 5-325 MG PO TABS
1.0000 | ORAL_TABLET | Freq: Four times a day (QID) | ORAL | Status: DC | PRN
  Administered 2021-06-10 – 2021-06-12 (×6): 1 via ORAL
  Filled 2021-06-10 (×7): qty 1

## 2021-06-10 MED ORDER — PANTOPRAZOLE SODIUM 40 MG IV SOLR
40.0000 mg | Freq: Two times a day (BID) | INTRAVENOUS | Status: DC
  Administered 2021-06-10 – 2021-06-13 (×6): 40 mg via INTRAVENOUS
  Filled 2021-06-10 (×6): qty 40

## 2021-06-10 MED ORDER — VENLAFAXINE HCL ER 150 MG PO CP24
150.0000 mg | ORAL_CAPSULE | Freq: Every day | ORAL | Status: DC
  Administered 2021-06-11 – 2021-06-13 (×3): 150 mg via ORAL
  Filled 2021-06-10 (×3): qty 1

## 2021-06-10 MED ORDER — NICOTINE 21 MG/24HR TD PT24
21.0000 mg | MEDICATED_PATCH | Freq: Every day | TRANSDERMAL | Status: DC
  Administered 2021-06-10 – 2021-06-13 (×4): 21 mg via TRANSDERMAL
  Filled 2021-06-10 (×4): qty 1

## 2021-06-10 MED ORDER — HYDROMORPHONE HCL 1 MG/ML IJ SOLN
1.0000 mg | Freq: Once | INTRAMUSCULAR | Status: AC
  Administered 2021-06-10: 1 mg via INTRAVENOUS
  Filled 2021-06-10: qty 1

## 2021-06-10 NOTE — H&P (Addendum)
[facility] History and Physical  [name] WSF:[id number] DOB: 1961-03-10 DOA: 06/10/2021 PCP: Bernerd Limbo, MD  Admitted from: Home Chief Complaint: Hematochezia, melena  History of Present Illness: [name] is a 60 y.o. female with significant past medical history including HTN, HLD, COPD, chronic daily smoker,, CHF, CAD/MI in 2015, aortic stenosis, peripheral artery disease s/p SMA stent, bilateral common iliac artery stent on aspirin, chronic mesenteric ischemia, history of GI bleeding, s/p colon resection in 2004 bowel obstruction. Patient presented to the ED today with complaint of melena and hematochezia for last 4 days associated with abdominal pain, shortness of breath, palpitations, lightheadedness.  In the ED, patient was afebrile, heart rate 93, blood pressure elevated to 207/92, breathing on room air. Initial labs with sodium 133, potassium 3.1, creatinine elevated to 4.33, hemoglobin low at 7.4. CT abdomen pelvis noncontrast did not show any acute intra-abdominal or intrapelvic process on this examination limited by the lack of intravenous and oral contrast. ED physician discussed the case with GI who recommended inpatient evaluation. Hospitalist service was consulted.  At the time of my evaluation, patient was sitting on the ED stretcher. Not in distress.  Just received some pain medicine. Sister at bedside. Patient states in January this year she had for surgeries.  She has 6 stents but taken off Plavix after persistent bleeding.  She knows she should quit smoking and states she is trying.  Currently smokes about half a pack a day.  Looks much older for her age.    Review of Systems:  All systems were reviewed and were negative unless otherwise mentioned in the HPI   Past medical history: Past Medical History:  Diagnosis Date   Anemia 2008   Anginal pain ([place])    Anxiety    Aortic stenosis    Arthritis    "all my joints ache" (09/07/2014)    Asthma    Bipolar 1 disorder ([place])    Blood transfusion without reported diagnosis    Bradycardia    Bruit    Carpal tunnel syndrome    Cataract    forming   Cervical cancer ([place]) 1985   CHF (congestive heart failure) ([place])    Chronic kidney disease (CKD), stage V (HCC)    COPD (chronic obstructive pulmonary disease) ([place]) 2000   Coronary artery disease    Daily headache    Depression 2000   Diverticulitis 2008   Emphysema of lung (HCC)    GERD (gastroesophageal reflux disease)    Glaucoma    Heart murmur    History of colon polyps 2009   HLD (hyperlipidemia) 2013   Hypertension 2013   Hypovitaminosis D    IBS (irritable bowel syndrome) 2008   Myocardial infarction [facility])    2015   PAD (peripheral artery disease) ([place])    Pancreatitis 10/2011   Pneumonia 07/2014   RLS (restless legs syndrome)    Schizophrenia (HCC)    Shortness of breath    Skin cancer    Small bowel obstruction ([place]) 2008   Thyroid disease     Past surgical history: Past Surgical History:  Procedure Laterality Date   ABDOMINAL ANGIOGRAM N/A 09/07/2014   Procedure: ABDOMINAL ANGIOGRAM;  Surgeon: Laverda Page, MD;  [facility] CATH LAB;  Service: Cardiovascular;  Laterality: N/A;   ABDOMINAL AORTOGRAM W/LOWER EXTREMITY N/A 12/28/2020   Procedure: ABDOMINAL AORTOGRAM W/LOWER EXTREMITY;  Surgeon: Cherre Robins, MD;  Location: [place] CV LAB;  Service: Cardiovascular;  Laterality: N/A;  APPENDECTOMY  10/01/1994   BIOPSY  08/06/2020   Procedure: BIOPSY;  Surgeon: Jackquline Denmark, MD;  Location: [facility] ENDOSCOPY;  Service: Endoscopy;;   COLON SURGERY  10/01/2006   6 inches of colon removed due to obstruction   COLONOSCOPY WITH PROPOFOL N/A 10/25/2016   Procedure: COLONOSCOPY WITH PROPOFOL;  Surgeon: Milus Banister, MD;  Location: [facility] ENDOSCOPY;  Service: Endoscopy;  Laterality: N/A;   COLONOSCOPY WITH PROPOFOL N/A 08/07/2020   Procedure: COLONOSCOPY WITH PROPOFOL;  Surgeon: Jackquline Denmark, MD;   Location: [facility] ENDOSCOPY;  Service: Endoscopy;  Laterality: N/A;   COLONOSCOPY WITH PROPOFOL N/A 08/27/2020   Procedure: COLONOSCOPY WITH PROPOFOL;  Surgeon: Lavena Bullion, DO;  Location: [facility] ENDOSCOPY;  Service: Gastroenterology;  Laterality: N/A;   CORONARY ANGIOPLASTY WITH STENT PLACEMENT  09/07/2014   "2"   ENTEROSCOPY N/A 08/27/2020   Procedure: ENTEROSCOPY;  Surgeon: Lavena Bullion, DO;  Location: [facility] ENDOSCOPY;  Service: Gastroenterology;  Laterality: N/A;  Push enteroscopy    ESOPHAGOGASTRODUODENOSCOPY (EGD) WITH PROPOFOL N/A 08/06/2020   Procedure: ESOPHAGOGASTRODUODENOSCOPY (EGD) WITH PROPOFOL;  Surgeon: Jackquline Denmark, MD;  Location: [facility]OS[facility];  Service: Endoscopy;  Laterality: N/A;   GIVENS CAPSULE STUDY N/A 08/24/2020   Procedure: GIVENS CAPSULE STUDY;  Surgeon: Lavena Bullion, DO;  Location: [facility] ENDOS[facility];  Service: Gastroenterology;  Laterality: N/A;   HOT HEMOSTASIS N/A 08/07/2020   Procedure: HOT HEMOSTASIS (ARGON PLASMA COAGULATION/BICAP);  Surgeon: Jackquline Denmark, MD;  Location: [facility] ENDOSCOPY;  Service: Endoscopy;  Laterality: N/A;   HOT HEMOSTASIS N/A 08/27/2020   Procedure: HOT HEMOSTASIS (ARGON PLASMA COAGULATION/BICAP);  Surgeon: Lavena Bullion, DO;  [facility]: [facility];  Service: Gastroenterology;  Laterality: N/A;   LEFT HEART CATH AND CORONARY ANGIOGRAPHY N/A 03/25/2018   Procedure: LEFT HEART CATH AND CORONARY ANGIOGRAPHY;  Surgeon: Nigel Mormon, MD;  Location: [place] CV LAB;  Service: Cardiovascular;  Laterality: N/A;   LEFT HEART CATH AND CORONARY ANGIOGRAPHY N/A 05/09/2021   Procedure: LEFT HEART CATH AND CORONARY ANGIOGRAPHY;  Surgeon: Adrian Prows, MD;  Location: [place] CV LAB;  Service: Cardiovascular;  Laterality: N/A;   LEFT HEART CATHETERIZATION WITH CORONARY ANGIOGRAM N/A 09/07/2014   Procedure: LEFT HEART CATHETERIZATION WITH CORONARY ANGIOGRAM;  Surgeon: Laverda Page, MD;  Location: [facility] CATH LAB;  Service: Cardiovascular;   Laterality: N/A;   LOWER EXTREMITY ANGIOGRAPHY  10/29/2017   Procedure: Lower Extremity Angiography;  Surgeon: Adrian Prows, MD;  Location: [place] CV LAB;  Service: Cardiovascular;;   PERIPHERAL VASCULAR CATHETERIZATION N/A 11/01/2015   Procedure: Renal Angiography;  Surgeon: Adrian Prows, MD;  Location: [place] CV LAB;  Service: Cardiovascular;  Laterality: N/A;   PERIPHERAL VASCULAR CATHETERIZATION N/A 04/10/2016   Procedure: Renal Angiography;  Surgeon: Adrian Prows, MD;  Location: [place] CV LAB;  Service: Cardiovascular;  Laterality: N/A;   PERIPHERAL VASCULAR CATHETERIZATION  04/10/2016   Procedure: Peripheral Vascular Intervention;  Surgeon: Adrian Prows, MD;  Location: [place] CV LAB;  Service: Cardiovascular;;   PERIPHERAL VASCULAR INTERVENTION  10/29/2017   Procedure: PERIPHERAL VASCULAR INTERVENTION;  Surgeon: Adrian Prows, MD;  Location: [place] CV LAB;  Service: Cardiovascular;;   PERIPHERAL VASCULAR INTERVENTION Bilateral 12/28/2020   Procedure: PERIPHERAL VASCULAR INTERVENTION;  Surgeon: Cherre Robins, MD;  Location: [place] CV LAB;  Service: Cardiovascular;  Laterality: Bilateral;   POLYPECTOMY  08/07/2020   Procedure: POLYPECTOMY;  Surgeon: Jackquline Denmark, MD;  [facility];  Service: Endoscopy;;   POLYPECTOMY  08/27/2020   Procedure: POLYPECTOMY;  Surgeon: Gerrit Heck  V, DO;  Location: [facility] ENDOS[facility];  Service: Gastroenterology;;   POLYPECTOMY     RENAL ANGIOGRAPHY N/A 10/29/2017   Procedure: RENAL ANGIOGRAPHY;  Surgeon: Adrian Prows, MD;  Location: [place] CV LAB;  Service: Cardiovascular;  Laterality: N/A;   RENAL ANGIOGRAPHY N/A 05/10/2020   Procedure: RENAL ANGIOGRAPHY;  Surgeon: Nigel Mormon, MD;  Location: [place] CV LAB;  Service: Cardiovascular;  Laterality: N/A;   RIGHT OOPHORECTOMY Right 10/01/1994   TOTAL ABDOMINAL HYSTERECTOMY  10/02/1995   UPPER GASTROINTESTINAL ENDOSCOPY      Social History:  reports that she has  been smoking cigarettes. She has a 60.00 pack-year smoking history. She has never used smokeless tobacco. She reports current drug use. Frequency: 3.00 times per week. Drug: Marijuana. She reports that she does not drink alcohol.  Allergies:  Allergies  Allergen Reactions   Doxycycline Anaphylaxis and Hives   Hydralazine Rash   Hydrocodone-Acetaminophen Nausea And Vomiting   Iohexol Itching    Pt has itching nose after iv contrast injection    Family history:  Family History  Problem Relation Age of Onset   Other Mother        many bowel obstructions   Heart disease Mother    Colon polyps Mother    Kidney cancer Father    Bone cancer Father    Diabetes Father    Heart disease Father    Heart disease Brother    Colon cancer Paternal Grandfather    Diabetes Daughter    Esophageal cancer Neg Hx    Rectal cancer Neg Hx    Stomach cancer Neg Hx      Home Meds: Prior to Admission medications   Medication Sig Start Date End Date Taking? Authorizing Provider  acetaminophen (TYLENOL) 500 MG tablet Take 1 tablet (500 mg total) by mouth every 8 (eight) hours as needed for mild pain or headache. 05/12/21  Yes Arrien, Jimmy Picket, MD  albuterol (PROVENTIL HFA;VENTOLIN HFA) 108 (90 BASE) MCG/ACT inhaler Inhale 2 puffs into the lungs every 6 (six) hours as needed for wheezing or shortness of breath (asthma).   Yes [provider]  ALPRAZolam Duanne Moron) 1 MG tablet Take 0.5-1 tablets by mouth 2 (two) times daily. 04/15/17  Yes [provider]  aspirin EC 81 MG EC tablet Take 1 tablet (81 mg total) by mouth daily. Swallow whole. 12/30/20  Yes Cherre Robins, MD  atorvastatin (LIPITOR) 80 MG tablet Take 80 mg by mouth every morning.   Yes [provider]  azelastine (ASTELIN) 0.1 % nasal spray Place 1 spray into both nostrils daily as needed (seasonal allergies). 06/24/19  Yes [provider]  ezetimibe (ZETIA) 10 MG tablet TAKE 1 TABLET BY MOUTH ONCE A  DAY Patient taking differently: Take 10 mg by mouth every morning. 05/03/21  Yes Cantwell, Celeste C, PA-C  labetalol (NORMODYNE) 100 MG tablet Take 1 tablet (100 mg total) by mouth 2 (two) times daily. 05/02/21  Yes Cantwell, Celeste C, PA-C  linaclotide (LINZESS) 72 MCG capsule Take 1 capsule (72 mcg total) by mouth daily before breakfast. 08/22/20  Yes Lemmon, Lavone Nian, PA  mometasone-formoterol (DULERA) 100-5 MCG/ACT AERO Take 2 puffs first thing in am and then another 2 puffs about 12 hours later. Patient taking differently: Inhale 2 puffs into the lungs See admin instructions. Inhale 2 puffs into the lungs every morning, may also inhale 2 puffs in the afternoon as needed for shortness of breath 04/26/17  Yes Tanda Rockers, MD  pantoprazole (PROTONIX) 40 MG tablet TAKE 1 TABLET BY MOUTH TWICE (2) DAILY Patient taking differently: Take 40 mg by mouth 2 (two) times daily. 06/08/20  Yes Adrian Prows, MD  amLODipine (NORVASC) 10 MG tablet TAKE 1 TABLET BY MOUTH ONCE DAILY Patient taking differently: Take 10 mg by mouth daily. 04/13/21   Cantwell, Celeste C, PA-C  isosorbide mononitrate (IMDUR) 60 MG 24 hr tablet Take 0.5 tablets (30 mg total) by mouth daily. Patient taking differently: Take 30 mg by mouth every morning. 05/12/21   Arrien, Jimmy Picket, MD  nitroGLYCERIN (NITROSTAT) 0.4 MG SL tablet Place 1 tablet (0.4 mg total) under the tongue every 5 (five) minutes as needed for chest pain. 10/15/19   Miquel Dunn, NP  oxyCODONE-acetaminophen (PERCOCET/ROXICET) 5-325 MG tablet Take 1 tablet by mouth every 6 (six) hours as needed for moderate pain. 05/12/21   Arrien, Jimmy Picket, MD  promethazine (PHENERGAN) 25 MG tablet Take 25 mg by mouth daily as needed for nausea or vomiting. 08/16/20   [provider]  venlafaxine XR (EFFEXOR XR) 150 MG 24 hr capsule Take 2 capsules (300 mg total) by mouth daily with breakfast. 05/12/21   Arrien, Jimmy Picket, MD    Physical  Exam: Vitals:   06/10/21 1715 06/10/21 1723 06/10/21 1725 06/10/21 1745  BP:  (!) 187/101  (!) 193/90  Pulse: 86 88 86 81  Resp:  19    Temp:      SpO2: 94% 96% 94% 94%  Weight:      Height:       Wt Readings from Last 3 Encounters:  06/10/21 42 kg  05/12/21 41.8 kg  05/02/21 40.6 kg   Body mass index is 17.5 kg/m.  General exam: Pleasant, middle-aged Caucasian female.  Looks older for her age Skin: No rashes, lesions or ulcers.  Dry skin HEENT: Atraumatic, normocephalic, no obvious bleeding Lungs: Clear to auscultation bilaterally CVS: Regular rate and rhythm, no murmur GI/Abd soft, nontender, nondistended, bowel sound present CNS: Alert, awake, oriented x3 Psychiatry: Mood appropriate Extremities: No pedal edema, no calf tenderness     Consult Orders  (From admission, onward)           Start     Ordered   06/10/21 1712  Consult to hospitalist  Once       Provider:  (Not yet assigned)  Question Answer Comment  Place call to: [facility]   Reason for Consult Admit      06/10/21 1711            Labs on Admission:   CBC: Recent Labs  Lab 06/10/21 1341 06/10/21 1608  WBC 7.0 7.3  NEUTROABS  --  4.7  HGB 7.4* 7.5*  HCT 22.9* 22.5*  MCV 92.0 92.6  PLT 347 601    Basic Metabolic Panel: Recent Labs  Lab 06/10/21 1341 06/10/21 1608  NA 133* 133*  K 3.7 3.1*  CL 101 98  CO2 22 22  GLUCOSE 109* 117*  BUN 42* 45*  CREATININE 4.33* 4.15*  CALCIUM 8.1* 8.2*    Liver Function Tests: Recent Labs  Lab 06/10/21 1341 06/10/21 1608  AST 21 21  ALT 15 14  ALKPHOS 126 136*  BILITOT 0.5 0.5  PROT 5.7* 6.3*  ALBUMIN 2.6* 2.9*   Recent Labs  Lab 06/10/21 1608  LIPASE 40   No results for input(s): AMMONIA in the last 168 hours.  Cardiac Enzymes: No results for input(s): CKTOTAL, CKMB, CKMBINDEX, TROPONINI in the last 168  hours.  BNP (last 3 results) No results for input(s): BNP in the last 8760 hours.  ProBNP (last 3  results) No results for input(s): PROBNP in the last 8760 hours.  CBG: No results for input(s): GLUCAP in the last 168 hours.  Lipase     Component Value Date/Time   LIPASE 40 06/10/2021 1608   LIPASE 509 (H) 06/07/2012 1350     Urinalysis    Component Value Date/Time   COLORURINE YELLOW 08/05/2020 0640   APPEARANCEUR Cloudy (A) 03/22/2021 1432   LABSPEC 1.014 08/05/2020 0640   LABSPEC 1.016 06/07/2012 1350   PHURINE 5.0 08/05/2020 0640   GLUCOSEU Negative 03/22/2021 1432   GLUCOSEU Negative 06/07/2012 1350   HGBUR NEGATIVE 08/05/2020 0640   BILIRUBINUR Negative 03/22/2021 1432   BILIRUBINUR Negative 06/07/2012 1350   KETONESUR NEGATIVE 08/05/2020 0640   PROTEINUR 3+ (A) 03/22/2021 1432   PROTEINUR >=300 (A) 08/05/2020 0640   UROBILINOGEN 0.2 06/28/2014 1518   NITRITE Negative 03/22/2021 1432   NITRITE NEGATIVE 08/05/2020 0640   LEUKOCYTESUR Negative 03/22/2021 1432   LEUKOCYTESUR NEGATIVE 08/05/2020 0640   LEUKOCYTESUR Negative 06/07/2012 1350     Drugs of Abuse     Component Value Date/Time   LABOPIA NONE DETECTED 05/12/2013 1536   COCAINSCRNUR NONE DETECTED 05/12/2013 1536   LABBENZ NONE DETECTED 05/12/2013 1536   AMPHETMU NONE DETECTED 05/12/2013 1536   THCU POSITIVE (A) 05/12/2013 1536   LABBARB NONE DETECTED 05/12/2013 1536      Radiological Exams on Admission: CT ABDOMEN PELVIS WO CONTRAST  Result Date: 06/10/2021 CLINICAL DATA:  Gastrointestinal bleeding, right upper quadrant abdominal pain EXAM: CT ABDOMEN AND PELVIS WITHOUT CONTRAST TECHNIQUE: Multidetector CT imaging of the abdomen and pelvis was performed following the standard protocol without IV contrast. COMPARISON:  05/09/2021 FINDINGS: Lower chest: No acute pleural or parenchymal lung disease. Hepatobiliary: Unenhanced imaging of the liver and gallbladder demonstrates no focal abnormalities. No biliary duct dilation. Pancreas: Unremarkable. No pancreatic ductal dilatation or surrounding inflammatory  changes. Spleen: Normal in size without focal abnormality. Adrenals/Urinary Tract: Stable left renal atrophy. No urinary tract calculi or obstructive uropathy within either kidney. The adrenals and bladder are unremarkable. Stomach/Bowel: No bowel obstruction or ileus. No bowel wall thickening or inflammatory change. Please note that the evaluation for gastrointestinal bleeding is limited without intravenous contrast. Vascular/Lymphatic: Extensive atherosclerosis of the aorta and its branches again noted. Bilateral iliac artery and renal artery stents are again seen. Evaluation of the vascular lumen is limited without IV contrast. No pathologic adenopathy within the abdomen or pelvis. Reproductive: Status post hysterectomy. No adnexal masses. Other: No free fluid or free gas.  No abdominal wall hernia. Musculoskeletal: No acute or destructive bony lesions. Reconstructed images demonstrate no additional findings. IMPRESSION: 1. No acute intra-abdominal or intrapelvic process on this examination limited by the lack of intravenous and oral contrast. 2. Stable left renal atrophy. 3.  Aortic Atherosclerosis (ICD10-I70.0). Electronically Signed   By: Randa Ngo M.D.   On: 06/10/2021 17:30     ------------------------------------------------------------------------------------------------------ Assessment/Plan: Active Problems:   Hypertensive urgency  Acute on chronic GI bleeding History of chronic mesenteric ischemia -Presented with hematochezia, melena. -History of chronic GI bleeding, mesenteric ischemia, diverticulosis. -FOBT positive in the ED. -[facility] consulted. -We will start on clear liquid diet at this time n.p.o. after midnight. -Protonix IV.  Acute on chronic blood loss anemia -Secondary to chronic recurrent GI bleeding.  Hemoglobin low at 7.5.  Continue to monitor.  Transfuse if less than 7.  Obtain type and screen with tomorrow morning's lab.  Patient gave verbal consent for  transfusion if required after morning labs. Recent Labs    08/22/20 1358 08/23/20 1048 02/09/21 0959 05/08/21 2208 05/10/21 0442 05/11/21 0842 06/10/21 1341 06/10/21 1608  HGB 8.8 Repeated and verified X2.*   < > 11.2* 11.8* 8.4* 8.4* 7.4* 7.5*  MCV 80.6   < > 96.3 91.1 94.5  --  92.0 92.6  FERRITIN 7.9*  --  90  --   --   --   --   --   TIBC  --   --  293  --   --   --   --   --   IRON 54  --  43*  --   --   --   --   --    < > = values in this interval not displayed.   AKI on CKD -Creatinine at baseline less than 3.5.  Presented with a creatinine elevated to 4.3.  Probably related to poor oral intake -Start IV fluid. -Follows up with [facility]rologist at [facility]. Recent Labs    10/24/20 1411 12/28/20 0707 12/28/20 1434 12/29/20 0138 12/30/20 1829 05/08/21 2208 05/10/21 0442 05/11/21 0424 05/12/21 0216 06/10/21 1341 06/10/21 1608  BUN 14 22*  --  24* 30* 25* 24* 25* 28* 42* 45*  CREATININE 2.13* 2.60* 2.67* 2.84* 2.63* 2.86* 3.20* 3.41* 3.30* 4.33* 4.15*   Hypokalemia -Potassium replacement ordered.  Recheck tomorrow. Recent Labs  Lab 06/10/21 1341 06/10/21 1608  K 3.7 3.1*   Hypertensive urgency History of CHF, aortic stenosis -Home meds include Amlodipine 10 mg daily, Imdur 30 mg daily, labetalol 100 mg twice daily -Patient apparently missed all her medication this morning while waiting in the ED.  It probably is the reason why her blood pressure is elevated. -Resume home meds.  IV hydralazine as needed  History of CAD, PAD, HLD Hyperlipidemia -Home meds include Aspirin 81 mg daily, Lipitor 80 mg daily, Zetia 10 mg daily, -Keep aspirin on hold.  Continue others.  Patient states he was taken off Plavix recently after recurrent GI bleeding.  COPD Chronic daily smoker -Continue bronchodilators -Smokes half a pack per day.  Counseled to quit smoking.  Nicotine patch ordered.  Anxiety/depression Xanax 0.5 mg twice daily, Effexor 300 mg daily  Chronic  pain Percocet 5 mg every 6 hours as needed,  Mobility: Encourage ambulation Code Status:   Code Status: Full Code.  Patient clearly stated full CODE STATUS on my evaluation. DVT prophylaxis: SCD boots.  Avoid heparin because of acute bleeding status. Antimicrobials: None Fluid: Normal saline at 75 mill per hour  Diet:  Diet Order             Diet clear liquid Room service appropriate? Yes; Fluid consistency: Thin  Diet effective now                    Consultants: GI Family Communication: Sister at bedside    Dispo: The patient is from: Home              Anticipated d/c is to: Home              Anticipated d/c date is: 3 days  ------------------------------------------------------------------------------------- Severity of Illness: The appropriate patient status for this patient is INPATIENT. Inpatient status is judged to be reasonable and necessary in order to provide the required intensity of service to ensure the patient's safety. [name] presenting symptoms, physical exam findings,  and initial radiographic and laboratory data in the context of their chronic comorbidities is felt to place them at high risk for further clinical deterioration. Furthermore, it is not anticipated that the patient will be medically stable for discharge from the hospital within 2 midnights of admission. The following factors support the patient status of inpatient.   " [name] presenting symptoms include hematochezia. " The worrisome physical exam findings include lethargy. " The initial radiographic and laboratory data are worrisome because of FOBT positive. " The chronic co-morbidities include chronic GI bleeding.   * I certify that at the point of admission it is my clinical judgment that the patient will require inpatient hospital care spanning beyond 2 midnights from the point of admission due to high intensity of service, high risk for further deterioration and high frequency  of surveillance required.*   Signed, Terrilee Croak, MD [facility] 06/10/2021

## 2021-06-10 NOTE — ED Provider Notes (Signed)
Emergency Medicine Provider Triage Evaluation Note  [name] , a 60 y.o. female  was evaluated in triage.  Pt complains of bloody bowel movements since Thursday.  Bright red blood per rectum.  She didn't take her meds this morning.  She reports her chronic abdominal pain is unchanged however she does have chest pressure also which she feels like is from her abdomen.  She had labs obtained at [facility] earlier today including CBC, CMP, and type and screen, according to chart review she waited about 2 hours and then left after not having been placed into a room.  Review of Systems  Positive: Abdominal pain, BRBPR Negative: Fever, chills  Physical Exam  BP (!) 207/92   Pulse 93   Temp 98.4 F (36.9 C)   Resp 19   SpO2 96%  Gen:   Awake, tearful Resp:  Normal effort  MSK:   Moves extremities without difficulty  Other:  Patient is awake and alert.  Speech is not slurred.  She is tearful.  Medical Decision Making  Medically screening exam initiated at 3:43 PM.  Appropriate orders placed.  Danayah Smyre Schneck was informed that the remainder of the evaluation will be completed by another provider, this initial triage assessment does not replace that evaluation, and the importance of remaining in the ED until their evaluation is complete.  Patient is also markedly hypertensive.  I suspect that this is in large part due to her not taking her antihypertensives prior to arrival.  Note: Portions of this report may have been transcribed using voice recognition software. Every effort was made to ensure accuracy; however, inadvertent computerized transcription errors may be present    Lorin Glass, PA-C 06/10/21 1551    Kommor, Debe Coder, MD 06/10/21 2250

## 2021-06-10 NOTE — ED Provider Notes (Signed)
[facility] DEPT Provider Note   CSN: [id number] Arrival date & time: 06/10/21  1527     History Chief Complaint  Patient presents with   Abdominal Pain   Rectal Bleeding    [name] is a 60 y.o. female with PMH malignant renovascular hypertension, renal artery stenosis, CKD 4, distal aortic stenosis, peripheral arterial disease, COPD last colonoscopy 08/27/2020 due to persistent rectal bleeding who presents the emergency department for evaluation of abdominal pain and rectal bleeding.  Patient states that for the last 4 days she has had new right upper quadrant abdominal pain and persistent bright red blood per rectum with every bowel movement.  He endorses fatigue and intermittent chest pain.  Chest pain not associated with shortness of breath or exertional diaphoresis.  She endorses nausea and early morning vomiting.  Denies fevers, cough, lower extremity swelling, dysuria or other systemic symptoms.  Of note, patient seen by primary care on 05/28/2021 with hemoglobin 9.2, creatinine 3.62.   Abdominal Pain Associated symptoms: hematochezia   Associated symptoms: no chest pain, no chills, no cough, no dysuria, no fever, no hematuria, no shortness of breath, no sore throat and no vomiting   Rectal Bleeding Associated symptoms: abdominal pain   Associated symptoms: no fever and no vomiting       Past Medical History:  Diagnosis Date   Anemia 2008   Anginal pain ([place])    Anxiety    Aortic stenosis    Arthritis    "all my joints ache" (09/07/2014)   Asthma    Bipolar 1 disorder ([place])    Blood transfusion without reported diagnosis    Bradycardia    Bruit    Carpal tunnel syndrome    Cataract    forming   Cervical cancer ([place]) 1985   CHF (congestive heart failure) ([place])    Chronic kidney disease (CKD), stage V (HCC)    COPD (chronic obstructive pulmonary disease) ([place]) 2000   Coronary artery disease    Daily headache    Depression  2000   Diverticulitis 2008   Emphysema of lung (HCC)    GERD (gastroesophageal reflux disease)    Glaucoma    Heart murmur    History of colon polyps 2009   HLD (hyperlipidemia) 2013   Hypertension 2013   Hypovitaminosis D    IBS (irritable bowel syndrome) 2008   Myocardial infarction ([place])    2015   PAD (peripheral artery disease) (HCC)    Pancreatitis 10/2011   Pneumonia 07/2014   RLS (restless legs syndrome)    Schizophrenia (HCC)    Shortness of breath    Skin cancer    Small bowel obstruction ([place]) 2008   Thyroid disease     Patient Active Problem List   Diagnosis Date Noted   AKI (acute kidney injury) ([place]) 05/11/2021   Chest pain, rule out acute myocardial infarction 05/09/2021   Abnormal EKG    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[place]) 12/30/2020   Chronic mesenteric ischemia ([place]) 12/28/2020   Polyp of ascending colon    AVM (arteriovenous malformation) of small bowel, acquired with hemorrhage    GIB (gastrointestinal bleeding) 08/23/2020   Hematochezia    Diverticulosis of colon without hemorrhage    Pain of upper abdomen    Angiodysplasia of colon 08/16/2020   GI bleed 08/06/2020   Acute GI bleeding 08/05/2020   Acute blood loss anemia 08/05/2020   Renal artery stenosis ([place]) 05/10/2020   Abdominal aortic stenosis 11/30/2018   Pure  hypercholesterolemia 10/22/2018   Abnormal stress test 03/21/2018   At high risk for injury related to fall 12/26/2017   Renal artery stenosis, native, bilateral ([place]) 10/27/2017   Benign neoplasm of transverse colon    Benign neoplasm of descending colon    Benign neoplasm of sigmoid colon    Abnormality on screening test 06/22/2016   Hypertensive urgency 11/01/2015   Chest pain 11/01/2015   History of partial colectomy 10/19/2014   Renovascular hypertension, malignant 09/07/2014   Anemia, chronic disease 07/30/2014   Heart failure with preserved ejection fraction ([place]) 07/02/2014   COPD GOLD 0 / still smoking 07/02/2014   CKD  (chronic kidney disease) stage 4, GFR 15-29 ml/min ([place]) 07/02/2014   Protein-calorie malnutrition, severe ([place]) 07/02/2014   Hyperparathyroidism, secondary renal ([place]) 04/30/2014   Postsurgical menopause 08/14/2013   Vaginal atrophy 08/14/2013   Screening for breast cancer 07/21/2012   Diverticulitis of colon with perforation 06/18/2012   Affective bipolar disorder ([place]) 02/02/2009   ADHESIONS, INTESTINAL W/OBSTRUCTION 08/10/2008   Irritable bowel syndrome 08/10/2008   Abdominal pain, generalized 08/10/2008   FECAL OCCULT BLOOD 08/10/2008   Anemia, iron deficiency 08/10/2008   Anxiety state 08/09/2008   DEPRESSION 08/09/2008   ASTHMA 08/09/2008   ESOPHAGEAL STRICTURE 08/09/2008   GERD 08/09/2008   HIATAL HERNIA 08/09/2008   Diverticulosis of large intestine 08/09/2008   ARTHRITIS 08/09/2008   HEADACHE, CHRONIC 08/09/2008   SKIN CANCER, HX OF 08/09/2008   COLONIC POLYPS, HYPERPLASTIC, HX OF 08/09/2008   Cigarette smoker 05/17/2008   Schizophrenia ([place]) 04/30/2008    Past Surgical History:  Procedure Laterality Date   ABDOMINAL ANGIOGRAM N/A 09/07/2014   Procedure: ABDOMINAL ANGIOGRAM;  Surgeon: Laverda Page, MD;  Location: [facility] CATH LAB;  Service: Cardiovascular;  Laterality: N/A;   ABDOMINAL AORTOGRAM W/LOWER EXTREMITY N/A 12/28/2020   Procedure: ABDOMINAL AORTOGRAM W/LOWER EXTREMITY;  Surgeon: Cherre Robins, MD;  Location: [facility] CV LAB;  Service: Cardiovascular;  Laterality: N/A;   APPENDECTOMY  10/01/1994   BIOPSY  08/06/2020   Procedure: BIOPSY;  Surgeon: Jackquline Denmark, MD;  Location: [facility] ENDOSCOPY;  Service: Endoscopy;;   COLON SURGERY  10/01/2006   6 inches of colon removed due to obstruction   COLONOSCOPY WITH PROPOFOL N/A 10/25/2016   Procedure: COLONOSCOPY WITH PROPOFOL;  Surgeon: Milus Banister, MD;  Location: [facility] ENDOSCOPY;  Service: Endoscopy;  Laterality: N/A;   COLONOSCOPY WITH PROPOFOL N/A 08/07/2020   Procedure: COLONOSCOPY WITH PROPOFOL;  Surgeon:  Jackquline Denmark, MD;  Location: [facility] ENDOSCOPY;  Service: Endoscopy;  Laterality: N/A;   COLONOSCOPY WITH PROPOFOL N/A 08/27/2020   Procedure: COLONOSCOPY WITH PROPOFOL;  Surgeon: Lavena Bullion, DO;  Location: [facility] ENDOS[facility];  Service: Gastroenterology;  Laterality: N/A;   CORONARY ANGIOPLASTY WITH STENT PLACEMENT  09/07/2014   "2"   ENTEROSCOPY N/A 08/27/2020   Procedure: ENTEROSCOPY;  Surgeon: Lavena Bullion, DO;  Location: [facility]OS[facility];  Service: Gastroenterology;  Laterality: N/A;  Push enteroscopy    ESOPHAGOGASTRODUODENOSCOPY (EGD) WITH PROPOFOL N/A 08/06/2020   Procedure: ESOPHAGOGASTRODUODENOSCOPY (EGD) WITH PROPOFOL;  Surgeon: Jackquline Denmark, MD;  Location: [facility]OS[facility];  Service: Endoscopy;  Laterality: N/A;   GIVENS CAPSULE STUDY N/A 08/24/2020   Procedure: GIVENS CAPSULE STUDY;  Surgeon: Lavena Bullion, DO;  Location: [facility];  Service: Gastroenterology;  Laterality: N/A;   HOT HEMOSTASIS N/A 08/07/2020   Procedure: HOT HEMOSTASIS (ARGON PLASMA COAGULATION/BICAP);  Surgeon: Jackquline Denmark, MD;  Location: [facility];  Service: Endoscopy;  Laterality: N/A;   HOT HEMOSTASIS N/A 08/27/2020  Procedure: HOT HEMOSTASIS (ARGON PLASMA COAGULATION/BICAP);  Surgeon: Lavena Bullion, DO;  Location: [facility];  Service: Gastroenterology;  Laterality: N/A;   LEFT HEART CATH AND CORONARY ANGIOGRAPHY N/A 03/25/2018   Procedure: LEFT HEART CATH AND CORONARY ANGIOGRAPHY;  Surgeon: Nigel Mormon, MD;  Location: [facility] LAB;  Service: Cardiovascular;  Laterality: N/A;   LEFT HEART CATH AND CORONARY ANGIOGRAPHY N/A 05/09/2021   Procedure: LEFT HEART CATH AND CORONARY ANGIOGRAPHY;  Surgeon: Adrian Prows, MD;  Location: [facility] LAB;  Service: Cardiovascular;  Laterality: N/A;   LEFT HEART CATHETERIZATION WITH CORONARY ANGIOGRAM N/A 09/07/2014   Procedure: LEFT HEART CATHETERIZATION WITH CORONARY ANGIOGRAM;  Surgeon: Laverda Page, MD;  Location: [facility] CATH LAB;  Service:  Cardiovascular;  Laterality: N/A;   LOWER EXTREMITY ANGIOGRAPHY  10/29/2017   Procedure: Lower Extremity Angiography;  Surgeon: Adrian Prows, MD;  Location: [facility] LAB;  Service: Cardiovascular;;   PERIPHERAL VASCULAR CATHETERIZATION N/A 11/01/2015   Procedure: Renal Angiography;  Surgeon: Adrian Prows, MD;  Location: [facility] LAB;  Service: Cardiovascular;  Laterality: N/A;   PERIPHERAL VASCULAR CATHETERIZATION N/A 04/10/2016   Procedure: Renal Angiography;  Surgeon: Adrian Prows, MD;  Location: [facility] CV LAB;  Service: Cardiovascular;  Laterality: N/A;   PERIPHERAL VASCULAR CATHETERIZATION  04/10/2016   Procedure: Peripheral Vascular Intervention;  Surgeon: Adrian Prows, MD;  Location: [facility] LAB;  Service: Cardiovascular;;   PERIPHERAL VASCULAR INTERVENTION  10/29/2017   Procedure: PERIPHERAL VASCULAR INTERVENTION;  Surgeon: Adrian Prows, MD;  Location: [facility] CV LAB;  Service: Cardiovascular;;   PERIPHERAL VASCULAR INTERVENTION Bilateral 12/28/2020   Procedure: PERIPHERAL VASCULAR INTERVENTION;  Surgeon: Cherre Robins, MD;  Location: [facility] CV LAB;  Service: Cardiovascular;  Laterality: Bilateral;   POLYPECTOMY  08/07/2020   Procedure: POLYPECTOMY;  Surgeon: Jackquline Denmark, MD;  [facility];  Service: Endoscopy;;   POLYPECTOMY  08/27/2020   Procedure: POLYPECTOMY;  Surgeon: Lavena Bullion, DO;  Location: [facility];  Service: Gastroenterology;;   POLYPECTOMY     RENAL ANGIOGRAPHY N/A 10/29/2017   Procedure: RENAL ANGIOGRAPHY;  Surgeon: Adrian Prows, MD;  Location: [facility];  Service: Cardiovascular;  Laterality: N/A;   RENAL ANGIOGRAPHY N/A 05/10/2020   Procedure: RENAL ANGIOGRAPHY;  Surgeon: Nigel Mormon, MD;  Location: [facility] LAB;  Service: Cardiovascular;  Laterality: N/A;   RIGHT OOPHORECTOMY Right 10/01/1994   TOTAL ABDOMINAL HYSTERECTOMY  10/02/1995   UPPER GASTROINTESTINAL ENDOSCOPY       OB History     Gravida   2   Para      Term      Preterm      AB  1   Living  1      SAB      IAB      Ectopic  1   Multiple      Live Births  1           Family History  Problem Relation Age of Onset   Other Mother        many bowel obstructions   Heart disease Mother    Colon polyps Mother    Kidney cancer Father    Bone cancer Father    Diabetes Father    Heart disease Father    Heart disease Brother    Colon cancer Paternal Grandfather    Diabetes Daughter    Esophageal cancer Neg Hx    Rectal cancer Neg Hx  Stomach cancer Neg Hx     Social History   Tobacco Use   Smoking status: Some Days    Packs/day: 1.50    Years: 40.00    Pack years: 60.00    Types: Cigarettes   Smokeless tobacco: Never  Vaping Use   Vaping Use: Former  Substance Use Topics   Alcohol use: No   Drug use: Yes    Frequency: 3.0 times per week    Types: Marijuana    Comment: last use yesterday    Home Medications Prior to Admission medications   Medication Sig Start Date End Date Taking? Authorizing Provider  acetaminophen (TYLENOL) 500 MG tablet Take 1 tablet (500 mg total) by mouth every 8 (eight) hours as needed for mild pain or headache. 05/12/21   Arrien, Jimmy Picket, MD  albuterol (PROVENTIL HFA;VENTOLIN HFA) 108 (90 BASE) MCG/ACT inhaler Inhale 2 puffs into the lungs every 6 (six) hours as needed for wheezing or shortness of breath (asthma).    [provider]  ALPRAZolam Duanne Moron) 1 MG tablet Take 0.5 tablets by mouth 2 (two) times daily as needed for anxiety. 04/15/17   [provider]  amLODipine (NORVASC) 10 MG tablet TAKE 1 TABLET BY MOUTH ONCE DAILY Patient taking differently: Take 10 mg by mouth daily. 04/13/21   Cantwell, Celeste C, PA-C  aspirin EC 81 MG EC tablet Take 1 tablet (81 mg total) by mouth daily. Swallow whole. 12/30/20   Cherre Robins, MD  atorvastatin (LIPITOR) 80 MG tablet Take 80 mg by mouth daily.     [provider]  azelastine  (ASTELIN) 0.1 % nasal spray Place 1 spray into both nostrils daily as needed for rhinitis or allergies.  06/24/19 05/09/21  [provider]  ezetimibe (ZETIA) 10 MG tablet TAKE 1 TABLET BY MOUTH ONCE A DAY Patient taking differently: Take 10 mg by mouth daily. 05/03/21   Cantwell, Celeste C, PA-C  isosorbide mononitrate (IMDUR) 60 MG 24 hr tablet Take 0.5 tablets (30 mg total) by mouth daily. 05/12/21   Arrien, Jimmy Picket, MD  labetalol (NORMODYNE) 100 MG tablet Take 1 tablet (100 mg total) by mouth 2 (two) times daily. 05/02/21   Cantwell, Celeste C, PA-C  linaclotide (LINZESS) 72 MCG capsule Take 1 capsule (72 mcg total) by mouth daily before breakfast. 08/22/20   Levin Erp, PA  megestrol (MEGACE) 40 MG tablet Take 1 tablet by mouth daily as needed (appetite). 01/24/21   [provider]  mometasone-formoterol (DULERA) 100-5 MCG/ACT AERO Take 2 puffs first thing in am and then another 2 puffs about 12 hours later. 04/26/17   Tanda Rockers, MD  nitroGLYCERIN (NITROSTAT) 0.4 MG SL tablet Place 1 tablet (0.4 mg total) under the tongue every 5 (five) minutes as needed for chest pain. 10/15/19   Miquel Dunn, NP  oxyCODONE-acetaminophen (PERCOCET/ROXICET) 5-325 MG tablet Take 1 tablet by mouth every 6 (six) hours as needed for moderate pain. 05/12/21   Arrien, Jimmy Picket, MD  pantoprazole (PROTONIX) 40 MG tablet TAKE 1 TABLET BY MOUTH TWICE (2) DAILY Patient taking differently: Take 40 mg by mouth 2 (two) times daily. 06/08/20   Adrian Prows, MD  promethazine (PHENERGAN) 25 MG tablet Take 25 mg by mouth daily as needed for nausea or vomiting. 08/16/20   [provider]  venlafaxine XR (EFFEXOR XR) 150 MG 24 hr capsule Take 2 capsules (300 mg total) by mouth daily with breakfast. 05/12/21   Arrien, Jimmy Picket, MD  Allergies    Doxycycline, Hydralazine, Hydrocodone-acetaminophen, and Iohexol  Review of Systems   Review of Systems  Constitutional:   Negative for chills and fever.  HENT:  Negative for ear pain and sore throat.   Eyes:  Negative for pain and visual disturbance.  Respiratory:  Negative for cough and shortness of breath.   Cardiovascular:  Negative for chest pain and palpitations.  Gastrointestinal:  Positive for abdominal pain, blood in stool, hematochezia and rectal pain. Negative for vomiting.  Genitourinary:  Negative for dysuria and hematuria.  Musculoskeletal:  Negative for arthralgias and back pain.  Skin:  Negative for color change and rash.  Neurological:  Negative for seizures and syncope.  All other systems reviewed and are negative.  Physical Exam Updated Vital Signs BP (!) 212/99   Pulse 95   Temp 98.4 F (36.9 C)   Resp 19   SpO2 95%   Physical Exam Vitals and nursing note reviewed.  Constitutional:      General: She is not in acute distress.    Appearance: She is well-developed.  HENT:     Head: Normocephalic and atraumatic.  Eyes:     Conjunctiva/sclera: Conjunctivae normal.  Cardiovascular:     Rate and Rhythm: Normal rate and regular rhythm.     Heart sounds: No murmur heard. Pulmonary:     Effort: Pulmonary effort is normal. No respiratory distress.     Breath sounds: Normal breath sounds.  Abdominal:     Palpations: Abdomen is soft.     Tenderness: There is abdominal tenderness in the right upper quadrant.  Genitourinary:    Rectum: Guaiac result positive.  Musculoskeletal:     Cervical back: Neck supple.  Skin:    General: Skin is warm and dry.  Neurological:     Mental Status: She is alert.    ED Results / Procedures / Treatments   Labs (all labs ordered are listed, but only abnormal results are displayed) Labs Reviewed  LACTIC ACID, PLASMA  LACTIC ACID, PLASMA  LIPASE, BLOOD  CBC WITH DIFFERENTIAL/PLATELET  COMPREHENSIVE METABOLIC PANEL  POC OCCULT BLOOD, ED  TYPE AND SCREEN    EKG EKG Interpretation  Date/Time:  Saturday June 10 2021 15:52:34  EDT Ventricular Rate:  97 PR Interval:  134 QRS Duration: 89 QT Interval:  352 QTC Calculation: 448 R Axis:   70 Text Interpretation: Sinus rhythm LVH repol abnormality in in V3 with no contiguous ST depressions Confirmed by Rahim Astorga ([id number]) on 06/10/2021 4:02:00 PM  Radiology No results found.  Procedures Procedures   Medications Ordered in ED Medications - No data to display  ED Course  I have reviewed the triage vital signs and the nursing notes.  Pertinent labs & imaging results that were available during my care of the patient were reviewed by me and considered in my medical decision making (see chart for details).    MDM Rules/Calculators/A&P                           Patient seen in the emergency department for evaluation of abdominal pain and rectal bleeding.  Physical exam reveals tenderness in the right upper quadrant and rectal exam with bright red blood, low volume.  Laboratory evaluation with a anemia to 7.5 which is a 2 g drop since 05/28/2021 (seen in outpatient [facility] care everywhere), creatinine also appears to have worsened from 3.56-4.33, BUN 42, hypoalbuminemia to 2.6.  Due to the patient's worsening GFR,  we can unfortunately not perform a CT angiogram to evaluate for mesenteric ischemia or source of active GI bleeding but a CT Noncon will be performed to rule out overt intra-abdominal pathology.  I spoke with GI (Dr. Lyndel Safe, but pt of Dr. Ardis Hughs) who will see the patient while an inpatient.  I will follow-up on CT imaging for evidence of surgical pathology.  Patient will require medical admission for management of active GI bleed and abdominal pain. Final Clinical Impression(s) / ED Diagnoses Final diagnoses:  None    Rx / DC Orders ED Discharge Orders     None        Eutimio Gharibian, Debe Coder, MD 06/10/21 1714

## 2021-06-10 NOTE — ED Triage Notes (Signed)
Reports bright red rectal bleeding and generalized abd pain since Thursday.

## 2021-06-10 NOTE — ED Triage Notes (Signed)
PT reports abdominal pain and rectal bleeding since Thursday.

## 2021-06-10 NOTE — ED Notes (Signed)
Patient to CT.

## 2021-06-10 NOTE — ED Provider Notes (Signed)
Emergency Medicine Provider Triage Evaluation Note  [name] , a 60 y.o. female  was evaluated in triage.  Pt complains of melena and hematochezia x4 days, history of GI bleed in the past.  She is anticoagulated.  Abdominal pain, shortness of breath, palpitations, lightheadedness.  Review of Systems  Positive: Melena, hematochezia, lightheadedness, shortness of breath and palpitations Negative: Fevers, chills, syncope, chest pain  Physical Exam  BP (!) 185/90   Pulse 80   Temp 97.9 F (36.6 C) (Oral)   Resp 16   SpO2 99%  Gen:   Awake, no distress   Resp:  Normal effort  MSK:   Moves extremities without difficulty  Other:  Tachycardic after ambulation, lungs with wheezing throughout, history of COPD.  Abdomen generally tender to palpation.  Medical Decision Making  Medically screening exam initiated at 12:57 PM.  Appropriate orders placed.  [name] was informed that the remainder of the evaluation will be completed by another provider, this initial triage assessment does not replace that evaluation, and the importance of remaining in the ED until their evaluation is complete.  This chart was dictated using voice recognition software, Dragon. Despite the best efforts of this provider to proofread and correct errors, errors may still occur which can change documentation meaning.    Aura Dials 06/10/21 1311    Margette Fast, MD 06/18/21 1734

## 2021-06-10 NOTE — Telephone Encounter (Signed)
[name] is a 60 year old female with a past medical history of coronary artery disease s/p MI 2015, AS, peripheral arterial disease with chronic mesenteric ischemia s/p SMA stent, terminal aortic stent and bilateral common iliac artery stent  on Plavix and ASA, diverticulitis and colon polyps. S/P colon resection secondary to obstruction in 2008.  Patient called the on-call service on Saturday at 3 PM.  She reported having 3-4 episodes of hematochezia daily since Thursday with generalized abdominal pain just progressively worsened. She presented to [facility] ED earlier this morning but waited 2 hours without being seen so she left.  I advised the patient to go back to the ED and if she did not have transportation and to call 911.  She agreed to go [facility] ED for further evaluation.  If she is not admitted over the weekend, pls have Patty contact the patient for an update and to arrange appropriate office follow-up.  Thank you

## 2021-06-10 NOTE — ED Notes (Signed)
Attempted to call report for patient. Nurse will call back.

## 2021-06-10 NOTE — ED Notes (Signed)
Patient back from CT.

## 2021-06-10 NOTE — ED Notes (Signed)
Pt's support person at desk asking about wait time. Pt informed about pt's acuity and the wait time currently. Pt states that she is too weak to wait and she has been bleeding too much and she needs to be back in a room now. Pt informed about wait time for a room to see the provider. Pt inquired about wait time at [facility]. Pt informed that we cannot give out the wait time at another facility. Pt said that she will be leaving and going to another facility since this is "not considered an emergency here". Pt again educated on acuity and wait time.

## 2021-06-10 NOTE — ED Notes (Signed)
Per MD pt had positive hemoccult

## 2021-06-11 ENCOUNTER — Encounter (HOSPITAL_COMMUNITY): Payer: Medicare Other

## 2021-06-11 DIAGNOSIS — K921 Melena: Secondary | ICD-10-CM | POA: Diagnosis not present

## 2021-06-11 DIAGNOSIS — I160 Hypertensive urgency: Secondary | ICD-10-CM | POA: Diagnosis not present

## 2021-06-11 LAB — CBC
HCT: 18.6 % — ABNORMAL LOW (ref 36.0–46.0)
Hemoglobin: 6.3 g/dL — CL (ref 12.0–15.0)
MCH: 30.7 pg (ref 26.0–34.0)
MCHC: 33.9 g/dL (ref 30.0–36.0)
MCV: 90.7 fL (ref 80.0–100.0)
Platelets: 270 10*3/uL (ref 150–400)
RBC: 2.05 MIL/uL — ABNORMAL LOW (ref 3.87–5.11)
RDW: 16.3 % — ABNORMAL HIGH (ref 11.5–15.5)
WBC: 4.9 10*3/uL (ref 4.0–10.5)
nRBC: 0 % (ref 0.0–0.2)

## 2021-06-11 LAB — BASIC METABOLIC PANEL
Anion gap: 10 (ref 5–15)
BUN: 41 mg/dL — ABNORMAL HIGH (ref 6–20)
CO2: 21 mmol/L — ABNORMAL LOW (ref 22–32)
Calcium: 8.2 mg/dL — ABNORMAL LOW (ref 8.9–10.3)
Chloride: 104 mmol/L (ref 98–111)
Creatinine, Ser: 3.87 mg/dL — ABNORMAL HIGH (ref 0.44–1.00)
GFR, Estimated: 13 mL/min — ABNORMAL LOW (ref >60)
Glucose, Bld: 84 mg/dL (ref 70–99)
Potassium: 3.9 mmol/L (ref 3.5–5.1)
Sodium: 135 mmol/L (ref 135–145)

## 2021-06-11 LAB — HEMOGLOBIN AND HEMATOCRIT, BLOOD
HCT: 29.7 % — ABNORMAL LOW (ref 36.0–46.0)
Hemoglobin: 10.1 g/dL — ABNORMAL LOW (ref 12.0–15.0)

## 2021-06-11 LAB — PREPARE RBC (CROSSMATCH)

## 2021-06-11 MED ORDER — HYDROMORPHONE HCL 1 MG/ML IJ SOLN
1.0000 mg | INTRAMUSCULAR | Status: DC | PRN
  Administered 2021-06-11 – 2021-06-13 (×8): 1 mg via INTRAVENOUS
  Filled 2021-06-11 (×8): qty 1

## 2021-06-11 MED ORDER — METHOCARBAMOL 500 MG PO TABS
500.0000 mg | ORAL_TABLET | Freq: Three times a day (TID) | ORAL | Status: AC
  Administered 2021-06-11 (×3): 500 mg via ORAL
  Filled 2021-06-11 (×3): qty 1

## 2021-06-11 MED ORDER — SODIUM CHLORIDE 0.9% IV SOLUTION: Freq: Once | INTRAVENOUS | Status: AC

## 2021-06-11 NOTE — Progress Notes (Signed)
PROGRESS NOTE  [place] J[place]  DOB: December 10, 1960  PCP: Bernerd Limbo, MD RXV:[id number]  DOA: 06/10/2021  LOS: 1 day  Hospital Day: 2   Chief Complaint  Patient presents with   Abdominal Pain   Rectal Bleeding    Brief narrative: [name] is a 60 y.o. female with PMH significant for HTN, HLD, CHF, CAD/MI in 2015, aortic stenosis s/p stent, COPD, half a pack per day smoking, peripheral artery disease s/p SMA stent, bilateral common iliac artery stent on aspirin, chronic mesenteric ischemia, history of GI bleeding, s/p colon resection in 2004 bowel obstruction. Patient presented to the ED on 9/10 with complaint of melena and hematochezia for 4 days associated with abdominal pain, shortness of breath, palpitations, lightheadedness.   In the ED, patient had blood pressure elevated to 207/92. Labs with hemoglobin at 7.4. CT abdomen pelvis noncontrast did not show any acute intra-abdominal or intrapelvic process on this examination limited by the lack of intravenous and oral contrast. Admitted to hospital service GI consultation was obtained See below for details  Subjective: Patient was seen and examined this morning. Middle-aged Caucasian female.  Looks older for age.  Complains of persistent lower back pain and is asking for IV Dilaudid. Last bowel movement with blood was yesterday in the ED.  No bowel movement this morning.  Assessment/Plan: Acute on chronic GI bleeding History of chronic mesenteric ischemia -Presented with hematochezia, melena. -History of chronic GI bleeding, mesenteric ischemia, diverticulosis. -FOBT positive in the ED. -GI following.  Defer to GI on the need of endoscopic evaluation.  Currently on clear liquid diet. -Continue Protonix IV.   Acute on chronic blood loss anemia -Secondary to chronic recurrent GI bleeding.  Presented with a hemoglobin low at 7.4.  Hemoglobin further down to 6.3 this morning.  2 units of PRBC was transfused.  Continue  to monitor hemoglobin.  Recent Labs    08/22/20 1358 08/23/20 1048 02/09/21 0959 05/08/21 2208 05/10/21 0442 05/11/21 0842 06/10/21 1341 06/10/21 1608 06/11/21 0301  HGB 8.8 Repeated and verified X2.*   < > 11.2*   < > 8.4* 8.4* 7.4* 7.5* 6.3*  MCV 80.6   < > 96.3   < > 94.5  --  92.0 92.6 90.7  FERRITIN 7.9*  --  90  --   --   --   --   --   --   TIBC  --   --  293  --   --   --   --   --   --   IRON 54  --  43*  --   --   --   --   --   --    < > = values in this interval not displayed.   AKI on CKD -Creatinine at baseline less than 3.5.  Presented with a creatinine elevated to 4.3.  Probably related to poor oral intake. -Improvement with IV fluid, 3.87 today. Recent Labs    12/28/20 0707 12/28/20 1434 12/29/20 0138 12/30/20 0738 05/08/21 2208 05/10/21 0442 05/11/21 0424 05/12/21 0216 06/10/21 1341 06/10/21 1608 06/11/21 0301  BUN 22*  --  24* 30* 25* 24* 25* 28* 42* 45* 41*  CREATININE 2.60* 2.67* 2.84* 2.63* 2.86* 3.20* 3.41* 3.30* 4.33* 4.15* 3.87*   Hypertensive urgency History of CHF, aortic stenosis -Blood pressure was significantly elevated over 200 at presentation probably because of missing her meds while waiting in the ED.   -Home meds include Amlodipine 10 mg daily, Imdur 30  mg daily, labetalol 100 mg twice daily -Home meds were resumed.  This morning, prior to home meds, blood pressure was in 190s..  If continues to remain elevated despite meds, will add p.m. meds.   History of CAD, PAD, HLD Hyperlipidemia -Home meds include Aspirin 81 mg daily, Lipitor 80 mg daily, Zetia 10 mg daily, -Currently aspirin is on continue others.  Patient states he was taken off Plavix recently after recurrent GI bleeding.   COPD Chronic daily smoker -Continue bronchodilators -Smokes half a pack per day.  Counseled to quit smoking.  Nicotine patch ordered.   Anxiety/depression Xanax 0.5 mg twice daily, Effexor 300 mg daily   Chronic pain -Complains of inability to  sleep last night because of back pain.   -Chronically on Percocet 5 mg every 6 hours as needed -I would add IV Dilaudid as needed for severe pain.  Also started on scheduled Robaxin for next 3 days.  Mobility: Encourage ambulation.  Previously independent Code Status:   Code Status: Full Code  Nutritional status: Body mass index is 16.45 kg/m.     Diet:  Diet Order             Diet clear liquid Room service appropriate? Yes; Fluid consistency: Thin  Diet effective now                  DVT prophylaxis:  Place and maintain sequential compression device Start: 06/11/21 0721   Antimicrobials: None Fluid: Normal saline 75 mill per hour Consultants: GI Family Communication: No family at bedside today  Status is: Inpatient  Remains inpatient appropriate because: GI bleeding, may need intervention  Dispo: The patient is from: Home              Anticipated d/c is to: Home              Patient currently is not medically stable to d/c.   Difficult to place patient No     Infusions:   sodium chloride 75 mL/hr at 06/11/21 0956    Scheduled Meds:  ALPRAZolam  0.5-1 mg Oral BID   amLODipine  10 mg Oral q morning   atorvastatin  80 mg Oral q morning   Chlorhexidine Gluconate Cloth  6 each Topical Q2000   ezetimibe  10 mg Oral q morning   isosorbide mononitrate  30 mg Oral q morning   labetalol  100 mg Oral BID   mouth rinse  15 mL Mouth Rinse BID   methocarbamol  500 mg Oral TID   nicotine  21 mg Transdermal Daily   pantoprazole (PROTONIX) IV  40 mg Intravenous Q12H   venlafaxine XR  150 mg Oral Q breakfast    Antimicrobials: Anti-infectives (From admission, onward)    None       PRN meds: acetaminophen **OR** acetaminophen, albuterol, HYDROmorphone (DILAUDID) injection, labetalol, oxyCODONE-acetaminophen   Objective: Vitals:   06/11/21 0912 06/11/21 1000  BP: (!) 191/92 (!) 158/71  Pulse: 73 (!) 59  Resp:  15  Temp:    SpO2:  98%    Intake/Output  Summary (Last 24 hours) at 06/11/2021 1020 Last data filed at 06/11/2021 0758 Gross per 24 hour  Intake 1110.17 ml  Output --  Net 1110.17 ml   Filed Weights   06/10/21 1712 06/10/21 2100  Weight: 42 kg 40.8 kg   Weight change:  Body mass index is 16.45 kg/m.   Physical Exam: General exam: Pleasant, middle-aged Caucasian female. Skin: No rashes, lesions or ulcers. HEENT:  Atraumatic, normocephalic, no obvious bleeding Lungs: Clear to auscultation bilaterally CVS: Regular rate and rhythm, no murmur GI/Abd soft, mild diffuse tenderness, nondistended, bowel sound present Back: Mild paraspinal tenderness on lower back bilaterally CNS: Alert, awake, oriented x3 Psychiatry: Mood appropriate Extremities: No pedal edema, no calf tenderness  Data Review: I have personally reviewed the laboratory data and studies available.  Recent Labs  Lab 06/10/21 1341 06/10/21 1608 06/11/21 0301  WBC 7.0 7.3 4.9  NEUTROABS  --  4.7  --   HGB 7.4* 7.5* 6.3*  HCT 22.9* 22.5* 18.6*  MCV 92.0 92.6 90.7  PLT 347 337 270   Recent Labs  Lab 06/10/21 1341 06/10/21 1608 06/11/21 0301  NA 133* 133* 135  K 3.7 3.1* 3.9  CL 101 98 104  CO2 22 22 21*  GLUCOSE 109* 117* 84  BUN 42* 45* 41*  CREATININE 4.33* 4.15* 3.87*  CALCIUM 8.1* 8.2* 8.2*    F/u labs ordered Unresulted Labs (From admission, onward)     Start     Ordered   06/11/21 8979  Basic metabolic panel  Daily,   R      06/10/21 1939   06/11/21 0500  CBC  Daily,   R      06/10/21 1939   Pending  Type and screen [facility]  Once,   STAT       Comments: [facility]    Pending            Signed, Terrilee Croak, MD [facility] 06/11/2021

## 2021-06-11 NOTE — Consult Note (Addendum)
Referring Provider: Dr. Terrilee Croak Primary Care Physician:  Bernerd Limbo, MD Primary Gastroenterologist:  Dr. Ardis Hughs  Reason for Consultation:  Abdominal pain, hematochezia   HPI: [name] is a 60 y.o. female [name] is a 60 year old female with a past medical history of anxiety, depression, bipolar disorder, hypertension, coronary artery disease s/p MI 2015, AS, bradycardia, peripheral arterial disease with chronic mesenteric ischemia s/p SMA stent, terminal aortic stent and bilateral common iliac artery stent by Dr. Stanford Breed 12/28/2020 on ASA ( Plavix dc'd 05/2021), COPD, CKD stage III, chronic anemia, small bowel and colon AVMs, GERD, chronic constipation, diverticulitis and colon polyps. S/P colon resection secondary to obstruction in 2008 and total abdominal hysterectomy 1997.   She developed worsening RUQ pain with bloody bowel movements Thurs 06/08/2021 which progressively worsened so she presented to [facility] ED Saturday 06/10/2021 for further evaluation, however, she left the ED after waiting 2 hours without being seen.  She called our GI on-call service with continued abdominal pain and hematochezia and she was instructed to go back to the ED and if necessary to call EMS for transportation.  She presented to [facility] ED for further evaluation.  Labs in the ED showed a Sodium level of 133.  Potassium 3.1.  BUN 45.  Creatinine 4.15.  Alk phos 136.  Lipase 40.  AST 21.  ALT 14.  Total bili 0.5.  Troponin 17.  WBC 7.3.  Hemoglobin 7.5 ( Hg 8.4 on 05/11/2021). Hematocrit 22.5.  Platelet 337.  INR 0.9.  SARS coronavirus negative.  Influenza AMB negative.  CTAP without contrast without evidence of acute intra-abdominal/pelvic inflammatory or infectious process.  No obstruction.  2 units of PRBCs were ordered, the first transfusion is nearly completed at this time.  No further melenic stools or hematochezia admission.  She has chronic RUQ pain with known history of  mesenteric ischemia.  She awakened Thurs 9/8 around 10:30am with worsening RUQ pain and generalized lower abdominal pain.  She went to the bathroom and described passing a mushy black stool with a large amount of bright red blood which turned the toilet water red.  She had 2-3 similar bowel movements throughout the day. Fri 9/9 she passed 3-4 brown bowel movements with a large amount of red blood. Sat 9/10 she passed 3-4 black stools with a large amount of red blood.  Her RUQ pain and lower abdominal pain worsened as noted above.  Following her hospital admission 05/08/2021, Plavix was discontinued and she denied seeing any black stools or bright red rectal bleeding until 9/8.  She remains on ASA 72m daily.  No other NSAIDs.  No alcohol use.  She has reduced her cigarette smoking to 1/3 pack/day.  She was admitted to the hospital 05/08/2021 due to having chest pain, N/V, abdominal pain and reported having intermittent melena.  She was hypertensive, troponin levels were elevated and her EKG showed significant abnormality with ST depression in the inferior lateral leads concerning for NSTEMI vs demand ischemia.  She underwent a cardiac catheterization without evidence of coronary artery disease and Plavix was discontinued by Dr. GEinar Gipbut she was continued on aspirin.  Note she was previously on Plavix due to her severe peripheral artery disease, chronic mesenteric ischemia s/p SMA stent, terminal aortic stent and bilateral common iliac artery stent 12/28/2020.  A GI consult was not requested during this hospitalization.  She was discharged home 05/12/2021.  Her most recent colonoscopy by Dr. JArdis Hughswas 04/12/2021  which identified one 8 mm hyperplastic polyp which was removed from the sigmoid colon and diverticulosis to the left colon was present.  CTAP without contrast 06/10/2021: 1. No acute intra-abdominal or intrapelvic process on this examination limited by the lack of intravenous and oral contrast. 2. Stable  left renal atrophy. 3.  Aortic Atherosclerosis    CTAP without contrast 05/09/2021: No acute abnormality noted.  Slight decrease in size of the left kidney when compared with the prior exam. This is likely related to renal artery stenosis. Stable nodule in the left lung base dating back to 2013. No further follow-up is recommended.  Aortic Atherosclerosis (ICD10-I70.0) and Emphysema   Cardiac catheterization 05/09/2021: LV: 108/2, EDP 12 mmHg.  Ao 111/58, mean 106 mmHg.  No pressure gradient across the aortic valve.  LVEDP is normal.  LV gram not performed to conserve contrast. LM: Large-caliber vessel, smooth and normal. LAD: Very large caliber vessel, gives origin to a large D1 and several small diagonals.  The LAD has mild proximal coronary calcification without luminal obstruction.  The LAD and diagonal are tortuous. Circumflex: There is minimal ectasia noted in the midsegment otherwise smooth and normal.  Gives origin to a moderate-sized OM1 and a large OM 2. RCA: [place] has a 30% calcific stenosis.  No damping of pressure in spite of the catheter being placed deep inside the artery.  Otherwise RCA smooth and mildly tortuous. From cardiac standpoint she can come off of Plavix as she is now having significant GI issues.   Past GI procedures: GI procedures:  Colonoscopy 04/12/2021 by Dr. Ardis Hughs: - One 8 mm polyp in the sigmoid colon, removed with a cold snare. Resected and retrieved. - Diverticulosis in the left colon. - The examination was otherwise normal on direct and retroflexion views. Biopsy Result: - HYPERPLASTIC POLYP. - NO ADENOMATOUS CHANGE OR CARCINOMA.  Enteroscopy August 27, 2020 while inpatient, Dr. Bryan Lemma.  The esophagus and stomach were normal.  Small AVM was found and treated with APC in the duodenum bulb.  A small AVM was found and treated with APC in the proximal jejunum. 08/06/2020 EGD Dr. Lyndel Safe with food residue in the stomach with outlet obstruction, otherwise  normal.  No upper GI bleeding.  Colonoscopy August 27, 2020 while inpatient, Dr. Bryan Lemma.  "Fair" prep.  20 mm sessile serrated polyp in the ascending was removed with a hot snare, piecemeal technique.  Repeat colonoscopy in 3 months because the bowel preparation was poor and for surveillance after piecemeal polypectomy.   Colonoscopy 08/07/2020 by Dr. Lyndel Safe; with a single nonbleeding colonic angiodysplastic lesion treated with APC, one 8 mm hyperplastic polyp in the distal sigmoid colon, minimal neosigmoid diverticulosis, patent end-to-end colocolonic anastomosis characterized by healthy-appearing mucosa, nonbleeding internal hemorrhoids.  It was felt the patient would likely need a repeat colonoscopy in 6 months with a 2-day preparation due to poor visualization.    Small bowel capsule endoscopy August 24, 2020 while inpatient showed small AVM in the duodenal bulb, small AVM in the proximal jejunum   Past Medical History:  Diagnosis Date   Anemia 2008   Anginal pain ([place])    Anxiety    Aortic stenosis    Arthritis    "all my joints ache" (09/07/2014)   Asthma    Bipolar 1 disorder ([place])    Blood transfusion without reported diagnosis    Bradycardia    Bruit    Carpal tunnel syndrome    Cataract    forming   Cervical cancer ([place])  1985   CHF (congestive heart failure) (HCC)    Chronic kidney disease (CKD), stage V (HCC)    COPD (chronic obstructive pulmonary disease) ([place]) 2000   Coronary artery disease    Daily headache    Depression 2000   Diverticulitis 2008   Emphysema of lung (HCC)    GERD (gastroesophageal reflux disease)    Glaucoma    Heart murmur    History of colon polyps 2009   HLD (hyperlipidemia) 2013   Hypertension 2013   Hypovitaminosis D    IBS (irritable bowel syndrome) 2008   Myocardial infarction [facility])    2015   PAD (peripheral artery disease) ([place])    Pancreatitis 10/2011   Pneumonia 07/2014   RLS (restless legs syndrome)    Schizophrenia (HCC)     Shortness of breath    Skin cancer    Small bowel obstruction ([place]) 2008   Thyroid disease     Past Surgical History:  Procedure Laterality Date   ABDOMINAL ANGIOGRAM N/A 09/07/2014   Procedure: ABDOMINAL ANGIOGRAM;  Surgeon: Laverda Page, MD;  Location[facility] CATH LAB;  Service: Cardiovascular;  Laterality: N/A;   ABDOMINAL AORTOGRAM W/LOWER EXTREMITY N/A 12/28/2020   Procedure: ABDOMINAL AORTOGRAM W/LOWER EXTREMITY;  Surgeon: Cherre Robins, MD;  Location: [place] CV LAB;  Service: Cardiovascular;  Laterality: N/A;   APPENDECTOMY  10/01/1994   BIOPSY  08/06/2020   Procedure: BIOPSY;  Surgeon: Jackquline Denmark, MD;  Location: [facility] ENDOSCOPY;  Service: Endoscopy;;   COLON SURGERY  10/01/2006   6 inches of colon removed due to obstruction   COLONOSCOPY WITH PROPOFOL N/A 10/25/2016   Procedure: COLONOSCOPY WITH PROPOFOL;  Surgeon: Milus Banister, MD;  Location: [facility] ENDOSCOPY;  Service: Endoscopy;  Laterality: N/A;   COLONOSCOPY WITH PROPOFOL N/A 08/07/2020   Procedure: COLONOSCOPY WITH PROPOFOL;  Surgeon: Jackquline Denmark, MD;  Location: [facility] ENDOSCOPY;  Service: Endoscopy;  Laterality: N/A;   COLONOSCOPY WITH PROPOFOL N/A 08/27/2020   Procedure: COLONOSCOPY WITH PROPOFOL;  Surgeon: Lavena Bullion, DO;  Location: [facility] ENDOSCOPY;  Service: Gastroenterology;  Laterality: N/A;   CORONARY ANGIOPLASTY WITH STENT PLACEMENT  09/07/2014   "2"   ENTEROSCOPY N/A 08/27/2020   Procedure: ENTEROSCOPY;  Surgeon: Lavena Bullion, DO;  Location: [facility] ENDOSCOPY;  Service: Gastroenterology;  Laterality: N/A;  Push enteroscopy    ESOPHAGOGASTRODUODENOSCOPY (EGD) WITH PROPOFOL N/A 08/06/2020   Procedure: ESOPHAGOGASTRODUODENOSCOPY (EGD) WITH PROPOFOL;  Surgeon: Jackquline Denmark, MD;  Location: [facility] ENDOSCOPY;  Service: Endoscopy;  Laterality: N/A;   GIVENS CAPSULE STUDY N/A 08/24/2020   Procedure: GIVENS CAPSULE STUDY;  Surgeon: Lavena Bullion, DO;  Location: [facility] ENDOSCOPY;  Service: Gastroenterology;   Laterality: N/A;   HOT HEMOSTASIS N/A 08/07/2020   Procedure: HOT HEMOSTASIS (ARGON PLASMA COAGULATION/BICAP);  Surgeon: Jackquline Denmark, MD;  Location: [facility] ENDOSCOPY;  Service: Endoscopy;  Laterality: N/A;   HOT HEMOSTASIS N/A 08/27/2020   Procedure: HOT HEMOSTASIS (ARGON PLASMA COAGULATION/BICAP);  Surgeon: Lavena Bullion, DO;  Location: [facility] ENDOSCOPY;  Service: Gastroenterology;  Laterality: N/A;   LEFT HEART CATH AND CORONARY ANGIOGRAPHY N/A 03/25/2018   Procedure: LEFT HEART CATH AND CORONARY ANGIOGRAPHY;  Surgeon: Nigel Mormon, MD;  Location: [place] CV LAB;  Service: Cardiovascular;  Laterality: N/A;   LEFT HEART CATH AND CORONARY ANGIOGRAPHY N/A 05/09/2021   Procedure: LEFT HEART CATH AND CORONARY ANGIOGRAPHY;  Surgeon: Adrian Prows, MD;  Location: [place] CV LAB;  Service: Cardiovascular;  Laterality: N/A;   LEFT HEART CATHETERIZATION WITH CORONARY ANGIOGRAM N/A  09/07/2014   Procedure: LEFT HEART CATHETERIZATION WITH CORONARY ANGIOGRAM;  Surgeon: Laverda Page, MD;  Location: [facility] CATH LAB;  Service: Cardiovascular;  Laterality: N/A;   LOWER EXTREMITY ANGIOGRAPHY  10/29/2017   Procedure: Lower Extremity Angiography;  Surgeon: Adrian Prows, MD;  Location: [place] CV LAB;  Service: Cardiovascular;;   PERIPHERAL VASCULAR CATHETERIZATION N/A 11/01/2015   Procedure: Renal Angiography;  Surgeon: Adrian Prows, MD;  Location: [place] CV LAB;  Service: Cardiovascular;  Laterality: N/A;   PERIPHERAL VASCULAR CATHETERIZATION N/A 04/10/2016   Procedure: Renal Angiography;  Surgeon: Adrian Prows, MD;  Location: [place] CV LAB;  Service: Cardiovascular;  Laterality: N/A;   PERIPHERAL VASCULAR CATHETERIZATION  04/10/2016   Procedure: Peripheral Vascular Intervention;  Surgeon: Adrian Prows, MD;  Location: [place] CV LAB;  Service: Cardiovascular;;   PERIPHERAL VASCULAR INTERVENTION  10/29/2017   Procedure: PERIPHERAL VASCULAR INTERVENTION;  Surgeon: Adrian Prows, MD;  Location: [place] CV LAB;  Service: Cardiovascular;;   PERIPHERAL VASCULAR INTERVENTION Bilateral 12/28/2020   Procedure: PERIPHERAL VASCULAR INTERVENTION;  Surgeon: Cherre Robins, MD;  Location: [place] CV LAB;  Service: Cardiovascular;  Laterality: Bilateral;   POLYPECTOMY  08/07/2020   Procedure: POLYPECTOMY;  Surgeon: Jackquline Denmark, MD;  Location: [facility] ENDOS[facility];  Service: Endoscopy;;   POLYPECTOMY  08/27/2020   Procedure: POLYPECTOMY;  Surgeon: Lavena Bullion, DO;  Location: [facility] ENDOSCOPY;  Service: Gastroenterology;;   POLYPECTOMY     RENAL ANGIOGRAPHY N/A 10/29/2017   Procedure: RENAL ANGIOGRAPHY;  Surgeon: Adrian Prows, MD;  Location: [place] CV LAB;  Service: Cardiovascular;  Laterality: N/A;   RENAL ANGIOGRAPHY N/A 05/10/2020   Procedure: RENAL ANGIOGRAPHY;  Surgeon: Nigel Mormon, MD;  Location: [place] CV LAB;  Service: Cardiovascular;  Laterality: N/A;   RIGHT OOPHORECTOMY Right 10/01/1994   TOTAL ABDOMINAL HYSTERECTOMY  10/02/1995   UPPER GASTROINTESTINAL ENDOSCOPY      Prior to Admission medications   Medication Sig Start Date End Date Taking? Authorizing Provider  acetaminophen (TYLENOL) 500 MG tablet Take 1 tablet (500 mg total) by mouth every 8 (eight) hours as needed for mild pain or headache. 05/12/21  Yes Arrien, Jimmy Picket, MD  albuterol (PROVENTIL HFA;VENTOLIN HFA) 108 (90 BASE) MCG/ACT inhaler Inhale 2 puffs into the lungs every 6 (six) hours as needed for wheezing or shortness of breath (asthma).   Yes [provider]  ALPRAZolam Duanne Moron) 1 MG tablet Take 0.5-1 tablets by mouth 2 (two) times daily. 04/15/17  Yes [provider]  amLODipine (NORVASC) 10 MG tablet TAKE 1 TABLET BY MOUTH ONCE DAILY Patient taking differently: Take 10 mg by mouth every morning. 04/13/21  Yes Cantwell, Celeste C, PA-C  aspirin EC 81 MG EC tablet Take 1 tablet (81 mg total) by mouth daily. Swallow whole. 12/30/20  Yes Cherre Robins, MD  atorvastatin  (LIPITOR) 80 MG tablet Take 80 mg by mouth every morning.   Yes [provider]  azelastine (ASTELIN) 0.1 % nasal spray Place 1 spray into both nostrils daily as needed (seasonal allergies). 06/24/19  Yes [provider]  ezetimibe (ZETIA) 10 MG tablet TAKE 1 TABLET BY MOUTH ONCE A DAY Patient taking differently: Take 10 mg by mouth every morning. 05/03/21  Yes Cantwell, Celeste C, PA-C  isosorbide mononitrate (IMDUR) 60 MG 24 hr tablet Take 0.5 tablets (30 mg total) by mouth daily. Patient taking differently: Take 30 mg by mouth every morning. 05/12/21  Yes Arrien, Jimmy Picket, MD  labetalol (NORMODYNE) 100 MG tablet  Take 1 tablet (100 mg total) by mouth 2 (two) times daily. 05/02/21  Yes Cantwell, Celeste C, PA-C  linaclotide (LINZESS) 72 MCG capsule Take 1 capsule (72 mcg total) by mouth daily before breakfast. 08/22/20  Yes Lemmon, Lavone Nian, PA  mometasone-formoterol (DULERA) 100-5 MCG/ACT AERO Take 2 puffs first thing in am and then another 2 puffs about 12 hours later. Patient taking differently: Inhale 2 puffs into the lungs See admin instructions. Inhale 2 puffs into the lungs every morning, may also inhale 2 puffs in the afternoon as needed for shortness of breath 04/26/17  Yes Tanda Rockers, MD  nitroGLYCERIN (NITROSTAT) 0.4 MG SL tablet Place 1 tablet (0.4 mg total) under the tongue every 5 (five) minutes as needed for chest pain. 10/15/19  Yes Miquel Dunn, NP  oxyCODONE-acetaminophen (PERCOCET/ROXICET) 5-325 MG tablet Take 1 tablet by mouth every 6 (six) hours as needed for moderate pain. 05/12/21  Yes Arrien, Jimmy Picket, MD  pantoprazole (PROTONIX) 40 MG tablet TAKE 1 TABLET BY MOUTH TWICE (2) DAILY Patient taking differently: Take 40 mg by mouth 2 (two) times daily. 06/08/20  Yes Adrian Prows, MD  promethazine (PHENERGAN) 25 MG tablet Take 25 mg by mouth 2 (two) times daily as needed for nausea or vomiting. 08/16/20  Yes [provider]   venlafaxine XR (EFFEXOR XR) 150 MG 24 hr capsule Take 2 capsules (300 mg total) by mouth daily with breakfast. 05/12/21  Yes Arrien, Jimmy Picket, MD    Current Facility-Administered Medications  Medication Dose Route Frequency Provider Last Rate Last Admin   0.9 %  sodium chloride infusion   Intravenous Continuous Dahal, Binaya, MD 75 mL/hr at 06/10/21 2100 Restarted at 06/10/21 2100   acetaminophen (TYLENOL) tablet 650 mg  650 mg Oral Q6H PRN Dahal, Marlowe Aschoff, MD       Or   acetaminophen (TYLENOL) suppository 650 mg  650 mg Rectal Q6H PRN Dahal, Binaya, MD       albuterol (PROVENTIL) (2.5 MG/3ML) 0.083% nebulizer solution 2.5 mg  2.5 mg Nebulization Q6H PRN Dahal, Binaya, MD       ALPRAZolam Duanne Moron) tablet 0.5-1 mg  0.5-1 mg Oral BID Dahal, Marlowe Aschoff, MD   1 mg at 06/10/21 2100   amLODipine (NORVASC) tablet 10 mg  10 mg Oral q morning Dahal, Marlowe Aschoff, MD   10 mg at 06/10/21 1836   atorvastatin (LIPITOR) tablet 80 mg  80 mg Oral q morning Dahal, Binaya, MD       Chlorhexidine Gluconate Cloth 2 % PADS 6 each  6 each Topical Q2000 Dahal, Binaya, MD   6 each at 06/10/21 2102   ezetimibe (ZETIA) tablet 10 mg  10 mg Oral q morning Dahal, Marlowe Aschoff, MD       isosorbide mononitrate (IMDUR) 24 hr tablet 30 mg  30 mg Oral q morning Dahal, Binaya, MD   30 mg at 06/10/21 1945   labetalol (NORMODYNE) injection 10 mg  10 mg Intravenous Q6H PRN Dahal, Marlowe Aschoff, MD   10 mg at 06/11/21 0406   labetalol (NORMODYNE) tablet 100 mg  100 mg Oral BID Dahal, Marlowe Aschoff, MD   100 mg at 06/10/21 1945   MEDLINE mouth rinse  15 mL Mouth Rinse BID Dahal, Binaya, MD       nicotine (NICODERM CQ - dosed in mg/24 hours) patch 21 mg  21 mg Transdermal Daily Dahal, Marlowe Aschoff, MD   21 mg at 06/10/21 1852   oxyCODONE-acetaminophen (PERCOCET/ROXICET) 5-325 MG per tablet 1 tablet  1 tablet  Oral Q6H PRN Terrilee Croak, MD   1 tablet at 06/10/21 2100   pantoprazole (PROTONIX) injection 40 mg  40 mg Intravenous Q12H Dahal, Marlowe Aschoff, MD   40 mg at  06/10/21 2101   venlafaxine XR (EFFEXOR-XR) 24 hr capsule 150 mg  150 mg Oral Q breakfast Dahal, Marlowe Aschoff, MD        Allergies as of 06/10/2021 - Review Complete 06/10/2021  Allergen Reaction Noted   Doxycycline Anaphylaxis and Hives    Hydralazine Rash 08/16/2020   Hydrocodone-acetaminophen Nausea And Vomiting 06/18/2012   Iohexol Itching 10/16/2011    Family History  Problem Relation Age of Onset   Other Mother        many bowel obstructions   Heart disease Mother    Colon polyps Mother    Kidney cancer Father    Bone cancer Father    Diabetes Father    Heart disease Father    Heart disease Brother    Colon cancer Paternal Grandfather    Diabetes Daughter    Esophageal cancer Neg Hx    Rectal cancer Neg Hx    Stomach cancer Neg Hx     Social History   Socioeconomic History   Marital status: Divorced    Spouse name: Not on file   Number of children: 1   Years of education: Not on file   Highest education level: Not on file  Occupational History   Occupation: Scientist, research (life sciences): UNEMPLOYED  Tobacco Use   Smoking status: Some Days    Packs/day: 1.50    Years: 40.00    Pack years: 60.00    Types: Cigarettes   Smokeless tobacco: Never  Vaping Use   Vaping Use: Former   Substances: THC  Substance and Sexual Activity   Alcohol use: No   Drug use: Yes    Frequency: 3.0 times per week    Types: Marijuana    Comment: last use yesterday   Sexual activity: Yes    Birth control/protection: Post-menopausal  Other Topics Concern   Not on file  Social History Narrative   Not on file   Social Determinants of Health   Financial Resource Strain: Not on file  Food Insecurity: Not on file  Transportation Needs: Not on file  Physical Activity: Not on file  Stress: Not on file  Social Connections: Not on file  Intimate Partner Violence: Not on file    Review of Systems: Gen: Denies fever, sweats or chills. No weight loss.  CV: Denies chest pain,  palpitations or edema. Resp: + Cough.  Shortness of breath.  No hemoptysis. GI: No GERD symptoms.  See HPI. GU : Denies urinary burning, blood in urine, increased urinary frequency or incontinence. MS: Denies joint pain, muscles aches or weakness. Derm: Denies rash, itchiness, skin lesions or unhealing ulcers. Psych: + Anxiety.  Heme: Denies easy bruising, bleeding. Neuro:  Denies headaches, dizziness or paresthesias. Endo:  Denies any problems with DM, thyroid or adrenal function.  Physical Exam: Vital signs in last 24 hours: Temp:  [97.2 F (36.2 C)-98.4 F (36.9 C)] 97.8 F (36.6 C) (09/11 0508) Pulse Rate:  [61-103] 65 (09/11 0508) Resp:  [9-20] 18 (09/11 0508) BP: (144-212)/(65-101) 170/76 (09/11 0508) SpO2:  [90 %-100 %] 94 % (09/11 0508) Weight:  [40.8 kg-42 kg] 40.8 kg (09/10 2100) Last BM Date: 06/10/21 General:  Alert 60 year old female ill-appearing in no acute distress. Head:  Normocephalic and atraumatic. Eyes:  No scleral icterus. Conjunctiva pink. Ears:  Normal auditory acuity. Nose:  No deformity, discharge or lesions. Mouth: Upper and lower dentures.  No ulcers or lesions.  Neck:  Supple. No lymphadenopathy or thyromegaly.  Lungs: Breath sounds clear throughout.  On oxygen 1 to 2 L nasal cannula. Heart: Regular rate and rhythm, systolic murmur. Abdomen: Soft, nondistended.  RUQ and lower abdominal tenderness without rebound or guarding.  Positive bowel sounds all 4 quadrants.  No bruit. Rectal: Deferred. Musculoskeletal:  Symmetrical without gross deformities.  Pulses:  Normal pulses noted. Extremities:  Without clubbing or edema. Neurologic:  Alert and  oriented x4. No focal deficits.  Skin:  Intact without significant lesions or rashes. Psych:  Alert and cooperative. Normal mood and affect.  Intake/Output from previous day: 09/10 0701 - 09/11 0700 In: 795.2 [I.V.:795.2] Out: -  Intake/Output this shift: Total I/O In: 795.2 [I.V.:795.2] Out: -   Lab  Results: Recent Labs    06/10/21 1341 06/10/21 1608 06/11/21 0301  WBC 7.0 7.3 4.9  HGB 7.4* 7.5* 6.3*  HCT 22.9* 22.5* 18.6*  PLT 347 337 270   BMET Recent Labs    06/10/21 1341 06/10/21 1608 06/11/21 0301  NA 133* 133* 135  K 3.7 3.1* 3.9  CL 101 98 104  CO2 22 22 21*  GLUCOSE 109* 117* 84  BUN 42* 45* 41*  CREATININE 4.33* 4.15* 3.87*  CALCIUM 8.1* 8.2* 8.2*   LFT Recent Labs    06/10/21 1608  PROT 6.3*  ALBUMIN 2.9*  AST 21  ALT 14  ALKPHOS 136*  BILITOT 0.5   PT/INR Recent Labs    06/10/21 1341  LABPROT 12.0  INR 0.9   Hepatitis Panel No results for input(s): HEPBSAG, HCVAB, HEPAIGM, HEPBIGM in the last 72 hours.    Studies/Results: CT ABDOMEN PELVIS WO CONTRAST  Result Date: 06/10/2021 CLINICAL DATA:  Gastrointestinal bleeding, right upper quadrant abdominal pain EXAM: CT ABDOMEN AND PELVIS WITHOUT CONTRAST TECHNIQUE: Multidetector CT imaging of the abdomen and pelvis was performed following the standard protocol without IV contrast. COMPARISON:  05/09/2021 FINDINGS: Lower chest: No acute pleural or parenchymal lung disease. Hepatobiliary: Unenhanced imaging of the liver and gallbladder demonstrates no focal abnormalities. No biliary duct dilation. Pancreas: Unremarkable. No pancreatic ductal dilatation or surrounding inflammatory changes. Spleen: Normal in size without focal abnormality. Adrenals/Urinary Tract: Stable left renal atrophy. No urinary tract calculi or obstructive uropathy within either kidney. The adrenals and bladder are unremarkable. Stomach/Bowel: No bowel obstruction or ileus. No bowel wall thickening or inflammatory change. Please note that the evaluation for gastrointestinal bleeding is limited without intravenous contrast. Vascular/Lymphatic: Extensive atherosclerosis of the aorta and its branches again noted. Bilateral iliac artery and renal artery stents are again seen. Evaluation of the vascular lumen is limited without IV contrast.  No pathologic adenopathy within the abdomen or pelvis. Reproductive: Status post hysterectomy. No adnexal masses. Other: No free fluid or free gas.  No abdominal wall hernia. Musculoskeletal: No acute or destructive bony lesions. Reconstructed images demonstrate no additional findings. IMPRESSION: 1. No acute intra-abdominal or intrapelvic process on this examination limited by the lack of intravenous and oral contrast. 2. Stable left renal atrophy. 3.  Aortic Atherosclerosis (ICD10-I70.0). Electronically Signed   By: Randa Ngo M.D.   On: 06/10/2021 17:30    IMPRESSION/PLAN:  60) 60 year old female with history of small bowel/colonic AVMs, diverticulosis and mesenteric ischemia admitted to the hospital 9/10 with RUQ and lower abdominal pain,  black stools + hematochezia with anemia. 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 CTAP wo contrast without intra-abdominal/pelvic pathology. Admission Hg 7.4 -> 7.5 -> 6.3 -> transfused 1 unit of PRBCs. -Check H&H posttransfusion then every 6 hours for the next 24 hours -Clear liquid diet -Continue normal saline IV at 75 cc an hour -Defer recommendations for endoscopic evaluation to Dr. Lyndel Safe -Pain management per the hospital  2) Acute on chronic anemia secondary to CKD, acute and chronic GI blood See plan #1  3) Peripheral vascular disease s/p SMA stent, terminal aortic stent and bilateral common iliac artery stent by Dr. Stanford Breed 12/28/2020 ASA.  Plavix discontinued 05/2021. -Consider vascular surgery consult during his hospital admission as Plavix was discussed 05/2021, assess for multiple vascular stent stenosis, query recurrent mesenteric ischemia -Patient is not a candidate for abd/pelvic CTA secondary to CKD  4) History of GERD -PPI 40 mg IV twice daily  5) History of colon polyps.  Next colonoscopy due 03/2024.  Noralyn Pick  06/11/2021, 8:20 AM   Attending  physician's note   I have taken an interval history, reviewed the chart and examined the patient. I agree with the Advanced Practitioner's note, impression and recommendations.   GI bleed (upper vs lower). Hb 8.4 (8/11) to 6.3 s/p 2U to 10. HD stable. Recent colon July 2022 was neg except for hyperplastic polyp/colonic diverticulosis.   RUQ abdo pain/lower abdominal pain with neg noncontrast CT AP.  Patient with H/O SMA stenosis s/p SMA stenting. Off Plavix.  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 -Proceed with SBE in AM. 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  Will order mesenteric Doppler for a.m. -If above is neg and still with bleeding, repeat colon to r/o ischemic colitis. -Dr. Dorsey/Dr. Rush Landmark taking over SVC tomorrow.  Patient is already aware of risks and benefits of push enteroscopy.  We have again gone over risks and benefits.  She agrees to proceed.   Carmell Austria, MD [facility] [phone]

## 2021-06-11 NOTE — H&P (View-Only) (Signed)
Referring Provider: Dr. Terrilee Croak Primary Care Physician:  Bernerd Limbo, MD Primary Gastroenterologist:  Dr. Ardis Hughs  Reason for Consultation:  Abdominal pain, hematochezia   HPI: [name] is a 60 y.o. female [place]. [place] is a 60 year old female with a past medical history of anxiety, depression, bipolar disorder, hypertension, coronary artery disease s/p MI 2015, AS, bradycardia, peripheral arterial disease with chronic mesenteric ischemia s/p SMA stent, terminal aortic stent and bilateral common iliac artery stent by Dr. Stanford Breed 12/28/2020 on ASA ( Plavix dc'd 05/2021), COPD, CKD stage III, chronic anemia, small bowel and colon AVMs, GERD, chronic constipation, diverticulitis and colon polyps. S/P colon resection secondary to obstruction in 2008 and total abdominal hysterectomy 1997.   She developed worsening RUQ pain with bloody bowel movements Thurs 06/08/2021 which progressively worsened so she presented to [facility] ED Saturday 06/10/2021 for further evaluation, however, she left the ED after waiting 2 hours without being seen.  She called our GI on-call service with continued abdominal pain and hematochezia and she was instructed to go back to the ED and if necessary to call EMS for transportation.  She presented to [facility] ED for further evaluation.  Labs in the ED showed a Sodium level of 133.  Potassium 3.1.  BUN 45.  Creatinine 4.15.  Alk phos 136.  Lipase 40.  AST 21.  ALT 14.  Total bili 0.5.  Troponin 17.  WBC 7.3.  Hemoglobin 7.5 ( Hg 8.4 on 05/11/2021). Hematocrit 22.5.  Platelet 337.  INR 0.9.  SARS coronavirus negative.  Influenza AMB negative.  CTAP without contrast without evidence of acute intra-abdominal/pelvic inflammatory or infectious process.  No obstruction.  2 units of PRBCs were ordered, the first transfusion is nearly completed at this time.  No further melenic stools or hematochezia admission.  She has chronic RUQ pain with known history of  mesenteric ischemia.  She awakened Thurs 9/8 around 10:30am with worsening RUQ pain and generalized lower abdominal pain.  She went to the bathroom and described passing a mushy black stool with a large amount of bright red blood which turned the toilet water red.  She had 2-3 similar bowel movements throughout the day. Fri 9/9 she passed 3-4 brown bowel movements with a large amount of red blood. Sat 9/10 she passed 3-4 black stools with a large amount of red blood.  Her RUQ pain and lower abdominal pain worsened as noted above.  Following her hospital admission 05/08/2021, Plavix was discontinued and she denied seeing any black stools or bright red rectal bleeding until 9/8.  She remains on ASA 77m daily.  No other NSAIDs.  No alcohol use.  She has reduced her cigarette smoking to 1/3 pack/day.  She was admitted to the hospital 05/08/2021 due to having chest pain, N/V, abdominal pain and reported having intermittent melena.  She was hypertensive, troponin levels were elevated and her EKG showed significant abnormality with ST depression in the inferior lateral leads concerning for NSTEMI vs demand ischemia.  She underwent a cardiac catheterization without evidence of coronary artery disease and Plavix was discontinued by Dr. GEinar Gipbut she was continued on aspirin.  Note she was previously on Plavix due to her severe peripheral artery disease, chronic mesenteric ischemia s/p SMA stent, terminal aortic stent and bilateral common iliac artery stent 12/28/2020.  A GI consult was not requested during this hospitalization.  She was discharged home 05/12/2021.  Her most recent colonoscopy by Dr. JArdis Hughswas 04/12/2021  which identified one 8 mm hyperplastic polyp which was removed from the sigmoid colon and diverticulosis to the left colon was present.  CTAP without contrast 06/10/2021: 1. No acute intra-abdominal or intrapelvic process on this examination limited by the lack of intravenous and oral contrast. 2. Stable  left renal atrophy. 3.  Aortic Atherosclerosis    CTAP without contrast 05/09/2021: No acute abnormality noted.  Slight decrease in size of the left kidney when compared with the prior exam. This is likely related to renal artery stenosis. Stable nodule in the left lung base dating back to 2013. No further follow-up is recommended.  Aortic Atherosclerosis (ICD10-I70.0) and Emphysema   Cardiac catheterization 05/09/2021: LV: 108/2, EDP 12 mmHg.  Ao 111/58, mean 106 mmHg.  No pressure gradient across the aortic valve.  LVEDP is normal.  LV gram not performed to conserve contrast. LM: Large-caliber vessel, smooth and normal. LAD: Very large caliber vessel, gives origin to a large D1 and several small diagonals.  The LAD has mild proximal coronary calcification without luminal obstruction.  The LAD and diagonal are tortuous. Circumflex: There is minimal ectasia noted in the midsegment otherwise smooth and normal.  Gives origin to a moderate-sized OM1 and a large OM 2. RCA: [place] has a 30% calcific stenosis.  No damping of pressure in spite of the catheter being placed deep inside the artery.  Otherwise RCA smooth and mildly tortuous. From cardiac standpoint she can come off of Plavix as she is now having significant GI issues.   Past GI procedures: GI procedures:  Colonoscopy 04/12/2021 by Dr. Ardis Hughs: - One 8 mm polyp in the sigmoid colon, removed with a cold snare. Resected and retrieved. - Diverticulosis in the left colon. - The examination was otherwise normal on direct and retroflexion views. Biopsy Result: - HYPERPLASTIC POLYP. - NO ADENOMATOUS CHANGE OR CARCINOMA.  Enteroscopy August 27, 2020 while inpatient, Dr. Bryan Lemma.  The esophagus and stomach were normal.  Small AVM was found and treated with APC in the duodenum bulb.  A small AVM was found and treated with APC in the proximal jejunum. 08/06/2020 EGD Dr. Lyndel Safe with food residue in the stomach with outlet obstruction, otherwise  normal.  No upper GI bleeding.  Colonoscopy August 27, 2020 while inpatient, Dr. Bryan Lemma.  "Fair" prep.  20 mm sessile serrated polyp in the ascending was removed with a hot snare, piecemeal technique.  Repeat colonoscopy in 3 months because the bowel preparation was poor and for surveillance after piecemeal polypectomy.   Colonoscopy 08/07/2020 by Dr. Lyndel Safe; with a single nonbleeding colonic angiodysplastic lesion treated with APC, one 8 mm hyperplastic polyp in the distal sigmoid colon, minimal neosigmoid diverticulosis, patent end-to-end colocolonic anastomosis characterized by healthy-appearing mucosa, nonbleeding internal hemorrhoids.  It was felt the patient would likely need a repeat colonoscopy in 6 months with a 2-day preparation due to poor visualization.    Small bowel capsule endoscopy August 24, 2020 while inpatient showed small AVM in the duodenal bulb, small AVM in the proximal jejunum   Past Medical History:  Diagnosis Date   Anemia 2008   Anginal pain ([place])    Anxiety    Aortic stenosis    Arthritis    "all my joints ache" (09/07/2014)   Asthma    Bipolar 1 disorder ([place])    Blood transfusion without reported diagnosis    Bradycardia    Bruit    Carpal tunnel syndrome    Cataract    forming   Cervical cancer ([place])  1985   CHF (congestive heart failure) (HCC)    Chronic kidney disease (CKD), stage V (HCC)    COPD (chronic obstructive pulmonary disease) ([place]) 2000   Coronary artery disease    Daily headache    Depression 2000   Diverticulitis 2008   Emphysema of lung (HCC)    GERD (gastroesophageal reflux disease)    Glaucoma    Heart murmur    History of colon polyps 2009   HLD (hyperlipidemia) 2013   Hypertension 2013   Hypovitaminosis D    IBS (irritable bowel syndrome) 2008   Myocardial infarction [facility])    2015   PAD (peripheral artery disease) ([place])    Pancreatitis 10/2011   Pneumonia 07/2014   RLS (restless legs syndrome)    Schizophrenia (HCC)     Shortness of breath    Skin cancer    Small bowel obstruction ([place]) 2008   Thyroid disease     Past Surgical History:  Procedure Laterality Date   ABDOMINAL ANGIOGRAM N/A 09/07/2014   Procedure: ABDOMINAL ANGIOGRAM;  Surgeon: Laverda Page, MD;  Location: [facility] CATH LAB;  Service: Cardiovascular;  Laterality: N/A;   ABDOMINAL AORTOGRAM W/LOWER EXTREMITY N/A 12/28/2020   Procedure: ABDOMINAL AORTOGRAM W/LOWER EXTREMITY;  Surgeon: Cherre Robins, MD;  Location: [place] CV LAB;  Service: Cardiovascular;  Laterality: N/A;   APPENDECTOMY  10/01/1994   BIOPSY  08/06/2020   Procedure: BIOPSY;  Surgeon: Jackquline Denmark, MD;  Location: [facility] ENDOSCOPY;  Service: Endoscopy;;   COLON SURGERY  10/01/2006   6 inches of colon removed due to obstruction   COLONOSCOPY WITH PROPOFOL N/A 10/25/2016   Procedure: COLONOSCOPY WITH PROPOFOL;  Surgeon: Milus Banister, MD;  Location: [facility] ENDOSCOPY;  Service: Endoscopy;  Laterality: N/A;   COLONOSCOPY WITH PROPOFOL N/A 08/07/2020   Procedure: COLONOSCOPY WITH PROPOFOL;  Surgeon: Jackquline Denmark, MD;  Location: [facility] ENDOSCOPY;  Service: Endoscopy;  Laterality: N/A;   COLONOSCOPY WITH PROPOFOL N/A 08/27/2020   Procedure: COLONOSCOPY WITH PROPOFOL;  Surgeon: Lavena Bullion, DO;  Location: [facility] ENDOSCOPY;  Service: Gastroenterology;  Laterality: N/A;   CORONARY ANGIOPLASTY WITH STENT PLACEMENT  09/07/2014   "2"   ENTEROSCOPY N/A 08/27/2020   Procedure: ENTEROSCOPY;  Surgeon: Lavena Bullion, DO;  Location: [facility] ENDOSCOPY;  Service: Gastroenterology;  Laterality: N/A;  Push enteroscopy    ESOPHAGOGASTRODUODENOSCOPY (EGD) WITH PROPOFOL N/A 08/06/2020   Procedure: ESOPHAGOGASTRODUODENOSCOPY (EGD) WITH PROPOFOL;  Surgeon: Jackquline Denmark, MD;  Location: [facility] ENDOSCOPY;  Service: Endoscopy;  Laterality: N/A;   GIVENS CAPSULE STUDY N/A 08/24/2020   Procedure: GIVENS CAPSULE STUDY;  Surgeon: Lavena Bullion, DO;  Location: [facility] ENDOSCOPY;  Service: Gastroenterology;   Laterality: N/A;   HOT HEMOSTASIS N/A 08/07/2020   Procedure: HOT HEMOSTASIS (ARGON PLASMA COAGULATION/BICAP);  Surgeon: Jackquline Denmark, MD;  Location: [facility];  Service: Endoscopy;  Laterality: N/A;   HOT HEMOSTASIS N/A 08/27/2020   Procedure: HOT HEMOSTASIS (ARGON PLASMA COAGULATION/BICAP);  Surgeon: Lavena Bullion, DO;  Location: [facility] ENDOS[facility];  Service: Gastroenterology;  Laterality: N/A;   LEFT HEART CATH AND CORONARY ANGIOGRAPHY N/A 03/25/2018   Procedure: LEFT HEART CATH AND CORONARY ANGIOGRAPHY;  Surgeon: Nigel Mormon, MD;  Location: [place] CV LAB;  Service: Cardiovascular;  Laterality: N/A;   LEFT HEART CATH AND CORONARY ANGIOGRAPHY N/A 05/09/2021   Procedure: LEFT HEART CATH AND CORONARY ANGIOGRAPHY;  Surgeon: Adrian Prows, MD;  Location: [place] CV LAB;  Service: Cardiovascular;  Laterality: N/A;   LEFT HEART CATHETERIZATION WITH CORONARY ANGIOGRAM N/A  09/07/2014   Procedure: LEFT HEART CATHETERIZATION WITH CORONARY ANGIOGRAM;  Surgeon: Laverda Page, MD;  Location: [facility] CATH LAB;  Service: Cardiovascular;  Laterality: N/A;   LOWER EXTREMITY ANGIOGRAPHY  10/29/2017   Procedure: Lower Extremity Angiography;  Surgeon: Adrian Prows, MD;  Location: [place] CV LAB;  Service: Cardiovascular;;   PERIPHERAL VASCULAR CATHETERIZATION N/A 11/01/2015   Procedure: Renal Angiography;  Surgeon: Adrian Prows, MD;  Location: [place] CV LAB;  Service: Cardiovascular;  Laterality: N/A;   PERIPHERAL VASCULAR CATHETERIZATION N/A 04/10/2016   Procedure: Renal Angiography;  Surgeon: Adrian Prows, MD;  Location: [place] CV LAB;  Service: Cardiovascular;  Laterality: N/A;   PERIPHERAL VASCULAR CATHETERIZATION  04/10/2016   Procedure: Peripheral Vascular Intervention;  Surgeon: Adrian Prows, MD;  Location: [place] CV LAB;  Service: Cardiovascular;;   PERIPHERAL VASCULAR INTERVENTION  10/29/2017   Procedure: PERIPHERAL VASCULAR INTERVENTION;  Surgeon: Adrian Prows, MD;  Location: [place] CV LAB;  Service: Cardiovascular;;   PERIPHERAL VASCULAR INTERVENTION Bilateral 12/28/2020   Procedure: PERIPHERAL VASCULAR INTERVENTION;  Surgeon: Cherre Robins, MD;  Location: [place] CV LAB;  Service: Cardiovascular;  Laterality: Bilateral;   POLYPECTOMY  08/07/2020   Procedure: POLYPECTOMY;  Surgeon: Jackquline Denmark, MD;  Location: [facility]OS[facility];  Service: Endoscopy;;   POLYPECTOMY  08/27/2020   Procedure: POLYPECTOMY;  Surgeon: Lavena Bullion, DO;  Location: [facility] ENDOS[facility];  Service: Gastroenterology;;   POLYPECTOMY     RENAL ANGIOGRAPHY N/A 10/29/2017   Procedure: RENAL ANGIOGRAPHY;  Surgeon: Adrian Prows, MD;  Location: [place] CV LAB;  Service: Cardiovascular;  Laterality: N/A;   RENAL ANGIOGRAPHY N/A 05/10/2020   Procedure: RENAL ANGIOGRAPHY;  Surgeon: Nigel Mormon, MD;  Location: [place] CV LAB;  Service: Cardiovascular;  Laterality: N/A;   RIGHT OOPHORECTOMY Right 10/01/1994   TOTAL ABDOMINAL HYSTERECTOMY  10/02/1995   UPPER GASTROINTESTINAL ENDOSCOPY      Prior to Admission medications   Medication Sig Start Date End Date Taking? Authorizing Provider  acetaminophen (TYLENOL) 500 MG tablet Take 1 tablet (500 mg total) by mouth every 8 (eight) hours as needed for mild pain or headache. 05/12/21  Yes Arrien, Jimmy Picket, MD  albuterol (PROVENTIL HFA;VENTOLIN HFA) 108 (90 BASE) MCG/ACT inhaler Inhale 2 puffs into the lungs every 6 (six) hours as needed for wheezing or shortness of breath (asthma).   Yes [provider]  ALPRAZolam Duanne Moron) 1 MG tablet Take 0.5-1 tablets by mouth 2 (two) times daily. 04/15/17  Yes [provider]  amLODipine (NORVASC) 10 MG tablet TAKE 1 TABLET BY MOUTH ONCE DAILY Patient taking differently: Take 10 mg by mouth every morning. 04/13/21  Yes Cantwell, Celeste C, PA-C  aspirin EC 81 MG EC tablet Take 1 tablet (81 mg total) by mouth daily. Swallow whole. 12/30/20  Yes Cherre Robins, MD  atorvastatin  (LIPITOR) 80 MG tablet Take 80 mg by mouth every morning.   Yes [provider]  azelastine (ASTELIN) 0.1 % nasal spray Place 1 spray into both nostrils daily as needed (seasonal allergies). 06/24/19  Yes [provider]  ezetimibe (ZETIA) 10 MG tablet TAKE 1 TABLET BY MOUTH ONCE A DAY Patient taking differently: Take 10 mg by mouth every morning. 05/03/21  Yes Cantwell, Celeste C, PA-C  isosorbide mononitrate (IMDUR) 60 MG 24 hr tablet Take 0.5 tablets (30 mg total) by mouth daily. Patient taking differently: Take 30 mg by mouth every morning. 05/12/21  Yes Arrien, Jimmy Picket, MD  labetalol (NORMODYNE) 100 MG tablet  Take 1 tablet (100 mg total) by mouth 2 (two) times daily. 05/02/21  Yes Cantwell, Celeste C, PA-C  linaclotide (LINZESS) 72 MCG capsule Take 1 capsule (72 mcg total) by mouth daily before breakfast. 08/22/20  Yes Lemmon, Lavone Nian, PA  mometasone-formoterol (DULERA) 100-5 MCG/ACT AERO Take 2 puffs first thing in am and then another 2 puffs about 12 hours later. Patient taking differently: Inhale 2 puffs into the lungs See admin instructions. Inhale 2 puffs into the lungs every morning, may also inhale 2 puffs in the afternoon as needed for shortness of breath 04/26/17  Yes Tanda Rockers, MD  nitroGLYCERIN (NITROSTAT) 0.4 MG SL tablet Place 1 tablet (0.4 mg total) under the tongue every 5 (five) minutes as needed for chest pain. 10/15/19  Yes Miquel Dunn, NP  oxyCODONE-acetaminophen (PERCOCET/ROXICET) 5-325 MG tablet Take 1 tablet by mouth every 6 (six) hours as needed for moderate pain. 05/12/21  Yes Arrien, Jimmy Picket, MD  pantoprazole (PROTONIX) 40 MG tablet TAKE 1 TABLET BY MOUTH TWICE (2) DAILY Patient taking differently: Take 40 mg by mouth 2 (two) times daily. 06/08/20  Yes Adrian Prows, MD  promethazine (PHENERGAN) 25 MG tablet Take 25 mg by mouth 2 (two) times daily as needed for nausea or vomiting. 08/16/20  Yes [provider]   venlafaxine XR (EFFEXOR XR) 150 MG 24 hr capsule Take 2 capsules (300 mg total) by mouth daily with breakfast. 05/12/21  Yes Arrien, Jimmy Picket, MD    Current Facility-Administered Medications  Medication Dose Route Frequency Provider Last Rate Last Admin   0.9 %  sodium chloride infusion   Intravenous Continuous Dahal, Binaya, MD 75 mL/hr at 06/10/21 2100 Restarted at 06/10/21 2100   acetaminophen (TYLENOL) tablet 650 mg  650 mg Oral Q6H PRN Dahal, Marlowe Aschoff, MD       Or   acetaminophen (TYLENOL) suppository 650 mg  650 mg Rectal Q6H PRN Dahal, Binaya, MD       albuterol (PROVENTIL) (2.5 MG/3ML) 0.083% nebulizer solution 2.5 mg  2.5 mg Nebulization Q6H PRN Dahal, Binaya, MD       ALPRAZolam Duanne Moron) tablet 0.5-1 mg  0.5-1 mg Oral BID Dahal, Marlowe Aschoff, MD   1 mg at 06/10/21 2100   amLODipine (NORVASC) tablet 10 mg  10 mg Oral q morning Dahal, Marlowe Aschoff, MD   10 mg at 06/10/21 1836   atorvastatin (LIPITOR) tablet 80 mg  80 mg Oral q morning Dahal, Binaya, MD       Chlorhexidine Gluconate Cloth 2 % PADS 6 each  6 each Topical Q2000 Dahal, Binaya, MD   6 each at 06/10/21 2102   ezetimibe (ZETIA) tablet 10 mg  10 mg Oral q morning Dahal, Marlowe Aschoff, MD       isosorbide mononitrate (IMDUR) 24 hr tablet 30 mg  30 mg Oral q morning Dahal, Binaya, MD   30 mg at 06/10/21 1945   labetalol (NORMODYNE) injection 10 mg  10 mg Intravenous Q6H PRN Dahal, Marlowe Aschoff, MD   10 mg at 06/11/21 0406   labetalol (NORMODYNE) tablet 100 mg  100 mg Oral BID Dahal, Marlowe Aschoff, MD   100 mg at 06/10/21 1945   MEDLINE mouth rinse  15 mL Mouth Rinse BID Dahal, Binaya, MD       nicotine (NICODERM CQ - dosed in mg/24 hours) patch 21 mg  21 mg Transdermal Daily Dahal, Marlowe Aschoff, MD   21 mg at 06/10/21 1852   oxyCODONE-acetaminophen (PERCOCET/ROXICET) 5-325 MG per tablet 1 tablet  1 tablet  Oral Q6H PRN Terrilee Croak, MD   1 tablet at 06/10/21 2100   pantoprazole (PROTONIX) injection 40 mg  40 mg Intravenous Q12H Dahal, Marlowe Aschoff, MD   40 mg at  06/10/21 2101   venlafaxine XR (EFFEXOR-XR) 24 hr capsule 150 mg  150 mg Oral Q breakfast Dahal, Marlowe Aschoff, MD        Allergies as of 06/10/2021 - Review Complete 06/10/2021  Allergen Reaction Noted   Doxycycline Anaphylaxis and Hives    Hydralazine Rash 08/16/2020   Hydrocodone-acetaminophen Nausea And Vomiting 06/18/2012   Iohexol Itching 10/16/2011    Family History  Problem Relation Age of Onset   Other Mother        many bowel obstructions   Heart disease Mother    Colon polyps Mother    Kidney cancer Father    Bone cancer Father    Diabetes Father    Heart disease Father    Heart disease Brother    Colon cancer Paternal Grandfather    Diabetes Daughter    Esophageal cancer Neg Hx    Rectal cancer Neg Hx    Stomach cancer Neg Hx     Social History   Socioeconomic History   Marital status: Divorced    Spouse name: Not on file   Number of children: 1   Years of education: Not on file   Highest education level: Not on file  Occupational History   Occupation: Scientist, research (life sciences): UNEMPLOYED  Tobacco Use   Smoking status: Some Days    Packs/day: 1.50    Years: 40.00    Pack years: 60.00    Types: Cigarettes   Smokeless tobacco: Never  Vaping Use   Vaping Use: Former   Substances: THC  Substance and Sexual Activity   Alcohol use: No   Drug use: Yes    Frequency: 3.0 times per week    Types: Marijuana    Comment: last use yesterday   Sexual activity: Yes    Birth control/protection: Post-menopausal  Other Topics Concern   Not on file  Social History Narrative   Not on file   Social Determinants of Health   Financial Resource Strain: Not on file  Food Insecurity: Not on file  Transportation Needs: Not on file  Physical Activity: Not on file  Stress: Not on file  Social Connections: Not on file  Intimate Partner Violence: Not on file    Review of Systems: Gen: Denies fever, sweats or chills. No weight loss.  CV: Denies chest pain,  palpitations or edema. Resp: + Cough.  Shortness of breath.  No hemoptysis. GI: No GERD symptoms.  See HPI. GU : Denies urinary burning, blood in urine, increased urinary frequency or incontinence. MS: Denies joint pain, muscles aches or weakness. Derm: Denies rash, itchiness, skin lesions or unhealing ulcers. Psych: + Anxiety.  Heme: Denies easy bruising, bleeding. Neuro:  Denies headaches, dizziness or paresthesias. Endo:  Denies any problems with DM, thyroid or adrenal function.  Physical Exam: Vital signs in last 24 hours: Temp:  [97.2 F (36.2 C)-98.4 F (36.9 C)] 97.8 F (36.6 C) (09/11 0508) Pulse Rate:  [61-103] 65 (09/11 0508) Resp:  [9-20] 18 (09/11 0508) BP: (144-212)/(65-101) 170/76 (09/11 0508) SpO2:  [90 %-100 %] 94 % (09/11 0508) Weight:  [40.8 kg-42 kg] 40.8 kg (09/10 2100) Last BM Date: 06/10/21 General:  Alert 60 year old female ill-appearing in no acute distress. Head:  Normocephalic and atraumatic. Eyes:  No scleral icterus. Conjunctiva pink. Ears:  Normal auditory acuity. Nose:  No deformity, discharge or lesions. Mouth: Upper and lower dentures.  No ulcers or lesions.  Neck:  Supple. No lymphadenopathy or thyromegaly.  Lungs: Breath sounds clear throughout.  On oxygen 1 to 2 L nasal cannula. Heart: Regular rate and rhythm, systolic murmur. Abdomen: Soft, nondistended.  RUQ and lower abdominal tenderness without rebound or guarding.  Positive bowel sounds all 4 quadrants.  No bruit. Rectal: Deferred. Musculoskeletal:  Symmetrical without gross deformities.  Pulses:  Normal pulses noted. Extremities:  Without clubbing or edema. Neurologic:  Alert and  oriented x4. No focal deficits.  Skin:  Intact without significant lesions or rashes. Psych:  Alert and cooperative. Normal mood and affect.  Intake/Output from previous day: 09/10 0701 - 09/11 0700 In: 795.2 [I.V.:795.2] Out: -  Intake/Output this shift: Total I/O In: 795.2 [I.V.:795.2] Out: -   Lab  Results: Recent Labs    06/10/21 1341 06/10/21 1608 06/11/21 0301  WBC 7.0 7.3 4.9  HGB 7.4* 7.5* 6.3*  HCT 22.9* 22.5* 18.6*  PLT 347 337 270   BMET Recent Labs    06/10/21 1341 06/10/21 1608 06/11/21 0301  NA 133* 133* 135  K 3.7 3.1* 3.9  CL 101 98 104  CO2 22 22 21*  GLUCOSE 109* 117* 84  BUN 42* 45* 41*  CREATININE 4.33* 4.15* 3.87*  CALCIUM 8.1* 8.2* 8.2*   LFT Recent Labs    06/10/21 1608  PROT 6.3*  ALBUMIN 2.9*  AST 21  ALT 14  ALKPHOS 136*  BILITOT 0.5   PT/INR Recent Labs    06/10/21 1341  LABPROT 12.0  INR 0.9   Hepatitis Panel No results for input(s): HEPBSAG, HCVAB, HEPAIGM, HEPBIGM in the last 72 hours.    Studies/Results: CT ABDOMEN PELVIS WO CONTRAST  Result Date: 06/10/2021 CLINICAL DATA:  Gastrointestinal bleeding, right upper quadrant abdominal pain EXAM: CT ABDOMEN AND PELVIS WITHOUT CONTRAST TECHNIQUE: Multidetector CT imaging of the abdomen and pelvis was performed following the standard protocol without IV contrast. COMPARISON:  05/09/2021 FINDINGS: Lower chest: No acute pleural or parenchymal lung disease. Hepatobiliary: Unenhanced imaging of the liver and gallbladder demonstrates no focal abnormalities. No biliary duct dilation. Pancreas: Unremarkable. No pancreatic ductal dilatation or surrounding inflammatory changes. Spleen: Normal in size without focal abnormality. Adrenals/Urinary Tract: Stable left renal atrophy. No urinary tract calculi or obstructive uropathy within either kidney. The adrenals and bladder are unremarkable. Stomach/Bowel: No bowel obstruction or ileus. No bowel wall thickening or inflammatory change. Please note that the evaluation for gastrointestinal bleeding is limited without intravenous contrast. Vascular/Lymphatic: Extensive atherosclerosis of the aorta and its branches again noted. Bilateral iliac artery and renal artery stents are again seen. Evaluation of the vascular lumen is limited without IV contrast.  No pathologic adenopathy within the abdomen or pelvis. Reproductive: Status post hysterectomy. No adnexal masses. Other: No free fluid or free gas.  No abdominal wall hernia. Musculoskeletal: No acute or destructive bony lesions. Reconstructed images demonstrate no additional findings. IMPRESSION: 1. No acute intra-abdominal or intrapelvic process on this examination limited by the lack of intravenous and oral contrast. 2. Stable left renal atrophy. 3.  Aortic Atherosclerosis (ICD10-I70.0). Electronically Signed   By: Randa Ngo M.D.   On: 06/10/2021 17:30    IMPRESSION/PLAN:  54) 60 year old female with history of small bowel/colonic AVMs, diverticulosis and mesenteric ischemia admitted to the hospital 9/10 with RUQ and lower abdominal pain,  black stools + hematochezia with anemia. 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 CTAP wo contrast without intra-abdominal/pelvic pathology. Admission Hg 7.4 -> 7.5 -> 6.3 -> transfused 1 unit of PRBCs. -Check H&H posttransfusion then every 6 hours for the next 24 hours -Clear liquid diet -Continue normal saline IV at 75 cc an hour -Defer recommendations for endoscopic evaluation to Dr. Lyndel Safe -Pain management per the hospital  2) Acute on chronic anemia secondary to CKD, acute and chronic GI blood See plan #1  3) Peripheral vascular disease s/p SMA stent, terminal aortic stent and bilateral common iliac artery stent by Dr. Stanford Breed 12/28/2020 ASA.  Plavix discontinued 05/2021. -Consider vascular surgery consult during his hospital admission as Plavix was discussed 05/2021, assess for multiple vascular stent stenosis, query recurrent mesenteric ischemia -Patient is not a candidate for abd/pelvic CTA secondary to CKD  4) History of GERD -PPI 40 mg IV twice daily  5) History of colon polyps.  Next colonoscopy due 03/2024.  Noralyn Pick  06/11/2021, 8:20 AM   Attending  physician's note   I have taken an interval history, reviewed the chart and examined the patient. I agree with the Advanced Practitioner's note, impression and recommendations.   GI bleed (upper vs lower). Hb 8.4 (8/11) to 6.3 s/p 2U to 10. HD stable. Recent colon July 2022 was neg except for hyperplastic polyp/colonic diverticulosis.   RUQ abdo pain/lower abdominal pain with neg noncontrast CT AP.  Patient with H/O SMA stenosis s/p SMA stenting. Off Plavix.  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 -Proceed with SBE in AM. 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  Will order mesenteric Doppler for a.m. -If above is neg and still with bleeding, repeat colon to r/o ischemic colitis. -Dr. Dorsey/Dr. Rush Landmark taking over SVC tomorrow.  Patient is already aware of risks and benefits of push enteroscopy.  We have again gone over risks and benefits.  She agrees to proceed.   Carmell Austria, MD [facility] [phone]

## 2021-06-12 ENCOUNTER — Encounter (HOSPITAL_COMMUNITY)
Admission: EM | Disposition: A | Discharge status: Home / Self Care | Payer: Self-pay | Source: Home / Self Care | Attending: Internal Medicine

## 2021-06-12 ENCOUNTER — Inpatient Hospital Stay (HOSPITAL_COMMUNITY): Payer: Medicare Other | Admitting: Registered Nurse

## 2021-06-12 ENCOUNTER — Inpatient Hospital Stay (HOSPITAL_COMMUNITY): Payer: Medicare Other

## 2021-06-12 ENCOUNTER — Encounter (HOSPITAL_COMMUNITY): Payer: Self-pay | Admitting: Internal Medicine

## 2021-06-12 DIAGNOSIS — R1084 Generalized abdominal pain: Secondary | ICD-10-CM

## 2021-06-12 DIAGNOSIS — K222 Esophageal obstruction: Secondary | ICD-10-CM

## 2021-06-12 DIAGNOSIS — I160 Hypertensive urgency: Secondary | ICD-10-CM | POA: Diagnosis not present

## 2021-06-12 HISTORY — PX: ENTEROSCOPY: SHX5533

## 2021-06-12 HISTORY — PX: SUBMUCOSAL TATTOO INJECTION: SHX6856

## 2021-06-12 LAB — BASIC METABOLIC PANEL WITH GFR
Anion gap: 6 (ref 5–15)
Anion gap: 5 (ref 5–15)
BUN: 37 mg/dL — ABNORMAL HIGH (ref 6–20)
BUN: 36 mg/dL — ABNORMAL HIGH (ref 6–20)
CO2: 20 mmol/L — ABNORMAL LOW (ref 22–32)
CO2: 20 mmol/L — ABNORMAL LOW (ref 22–32)
Calcium: 8.7 mg/dL — ABNORMAL LOW (ref 8.9–10.3)
Calcium: 8.3 mg/dL — ABNORMAL LOW (ref 8.9–10.3)
Chloride: 107 mmol/L (ref 98–111)
Chloride: 110 mmol/L (ref 98–111)
Creatinine, Ser: 3.44 mg/dL — ABNORMAL HIGH (ref 0.44–1.00)
Creatinine, Ser: 3.58 mg/dL — ABNORMAL HIGH (ref 0.44–1.00)
GFR, Estimated: 15 mL/min — ABNORMAL LOW
GFR, Estimated: 14 mL/min — ABNORMAL LOW
Glucose, Bld: 90 mg/dL (ref 70–99)
Glucose, Bld: 87 mg/dL (ref 70–99)
Potassium: 5.6 mmol/L — ABNORMAL HIGH (ref 3.5–5.1)
Potassium: 4.9 mmol/L (ref 3.5–5.1)
Sodium: 133 mmol/L — ABNORMAL LOW (ref 135–145)
Sodium: 135 mmol/L (ref 135–145)

## 2021-06-12 LAB — TYPE AND SCREEN
ABO/RH(D): O NEG
Antibody Screen: NEGATIVE
Unit division: 0
Unit division: 0

## 2021-06-12 LAB — CBC
HCT: 32.2 % — ABNORMAL LOW (ref 36.0–46.0)
Hemoglobin: 10.8 g/dL — ABNORMAL LOW (ref 12.0–15.0)
MCH: 29.4 pg (ref 26.0–34.0)
MCHC: 33.5 g/dL (ref 30.0–36.0)
MCV: 87.7 fL (ref 80.0–100.0)
Platelets: 277 10*3/uL (ref 150–400)
RBC: 3.67 MIL/uL — ABNORMAL LOW (ref 3.87–5.11)
RDW: 17.2 % — ABNORMAL HIGH (ref 11.5–15.5)
WBC: 5.9 10*3/uL (ref 4.0–10.5)
nRBC: 0 % (ref 0.0–0.2)

## 2021-06-12 LAB — BPAM RBC
Blood Product Expiration Date: 202210052359
Blood Product Expiration Date: 202210072359
ISSUE DATE / TIME: 202209110442
ISSUE DATE / TIME: 202209110735
Unit Type and Rh: 9500
Unit Type and Rh: 9500

## 2021-06-12 SURGERY — ENTEROSCOPY
Anesthesia: Monitor Anesthesia Care

## 2021-06-12 MED ORDER — SODIUM CHLORIDE 0.9 % IV BOLUS
500.0000 mL | Freq: Once | INTRAVENOUS | Status: AC
  Administered 2021-06-12: 500 mL via INTRAVENOUS

## 2021-06-12 MED ORDER — PROPOFOL 10 MG/ML IV BOLUS
INTRAVENOUS | Status: DC | PRN
  Administered 2021-06-12: 20 mg via INTRAVENOUS
  Administered 2021-06-12: 10 mg via INTRAVENOUS

## 2021-06-12 MED ORDER — SODIUM CHLORIDE 0.9 % IV SOLN
INTRAVENOUS | Status: DC
  Administered 2021-06-12: 500 mL via INTRAVENOUS

## 2021-06-12 MED ORDER — PROPOFOL 500 MG/50ML IV EMUL
INTRAVENOUS | Status: DC | PRN
  Administered 2021-06-12: 130 ug/kg/min via INTRAVENOUS

## 2021-06-12 MED ORDER — SPOT INK MARKER SYRINGE KIT
PACK | SUBMUCOSAL | Status: DC | PRN
  Administered 2021-06-12: 1 mL via SUBMUCOSAL

## 2021-06-12 MED ORDER — SODIUM CHLORIDE 0.9 % IV SOLN
510.0000 mg | Freq: Once | INTRAVENOUS | Status: AC
  Administered 2021-06-12: 510 mg via INTRAVENOUS
  Filled 2021-06-12: qty 510

## 2021-06-12 MED ORDER — ISOSORBIDE MONONITRATE ER 60 MG PO TB24
60.0000 mg | ORAL_TABLET | Freq: Every day | ORAL | Status: DC
  Administered 2021-06-12 – 2021-06-13 (×2): 60 mg via ORAL
  Filled 2021-06-12 (×2): qty 1

## 2021-06-12 MED ORDER — ISOSORBIDE MONONITRATE ER 60 MG PO TB24: 30.0000 mg | ORAL_TABLET | Freq: Two times a day (BID) | ORAL | Status: DC

## 2021-06-12 MED ORDER — CHLORTHALIDONE 50 MG PO TABS
50.0000 mg | ORAL_TABLET | Freq: Once | ORAL | Status: AC
  Administered 2021-06-12: 50 mg via ORAL
  Filled 2021-06-12: qty 1

## 2021-06-12 NOTE — Anesthesia Preprocedure Evaluation (Addendum)
Anesthesia Evaluation  Patient identified by MRN, date of birth, ID band Patient awake    Reviewed: Allergy & Precautions, NPO status , Patient's Chart, lab work & pertinent test results  Airway Mallampati: II  TM Distance: >3 FB Neck ROM: Full    Dental  (+) Upper Dentures, Lower Dentures   Pulmonary neg pulmonary ROS, Current Smoker,    Pulmonary exam normal breath sounds clear to auscultation       Cardiovascular hypertension, + CAD, + Past MI, + Cardiac Stents and + Peripheral Vascular Disease  Normal cardiovascular exam+ Valvular Problems/Murmurs AS  Rhythm:Regular Rate:Normal + Systolic murmurs    Neuro/Psych negative neurological ROS  negative psych ROS   GI/Hepatic Neg liver ROS, GERD  Medicated,  Endo/Other  negative endocrine ROS  Renal/GU Renal disease  negative genitourinary   Musculoskeletal negative musculoskeletal ROS (+)   Abdominal   Peds negative pediatric ROS (+)  Hematology  (+) anemia ,   Anesthesia Other Findings   Reproductive/Obstetrics negative OB ROS                            Anesthesia Physical Anesthesia Plan  ASA: 3  Anesthesia Plan: MAC   Post-op Pain Management:    Induction: Intravenous  PONV Risk Score and Plan: 2 and Propofol infusion and Treatment may vary due to age or medical condition  Airway Management Planned: Simple Face Mask  Additional Equipment:   Intra-op Plan:   Post-operative Plan:   Informed Consent: I have reviewed the patients History and Physical, chart, labs and discussed the procedure including the risks, benefits and alternatives for the proposed anesthesia with the patient or authorized representative who has indicated his/her understanding and acceptance.     Dental advisory given  Plan Discussed with: CRNA and Surgeon  Anesthesia Plan Comments:         Anesthesia Quick Evaluation

## 2021-06-12 NOTE — Progress Notes (Signed)
Contacted provider concerning blood pressure that continues to be elevated after receiving medication as prescribed. It has not been having an effect on her BP but has been causing her heart rate to dip into the 40's-50's. Provider gave me a one time dose of an oral medication. This did not cause a drop in her HR but was still ineffective on her BP. 0500 BP extremely elevated, patient is now reporting increased pain despite being asleep when I entered room. Gave patient her prn dose of IV pain medication and will re-check BP in 30 minutes. If still elevated I will give the IV BP medication and continue to monitor HR closely.

## 2021-06-12 NOTE — TOC Initial Note (Signed)
Transition of Care [facility]) - Initial/Assessment Note    Patient Details  Name: [name] MRN: [id number] Date of Birth: 1961/03/16  Transition of [facility]) CM/SW Contact:    Leeroy Cha, RN Phone Number: 06/12/2021, 9:21 AM  Clinical Narrative:                 60 y.o. female with significant past medical history including HTN, HLD, COPD, chronic daily smoker,, CHF, CAD/MI in 2015, aortic stenosis, peripheral artery disease s/p SMA stent, bilateral common iliac artery stent on aspirin, chronic mesenteric ischemia, history of GI bleeding, s/p colon resection in 2004 bowel obstruction. Patient presented to the ED today with complaint of melena and hematochezia for last 4 days associated with abdominal pain, shortness of breath, palpitations, lightheadedness.   In the ED, patient was afebrile, heart rate 93, blood pressure elevated to 207/92, breathing on room air. Initial labs with sodium 133, potassium 3.1, creatinine elevated to 4.33, hemoglobin low at 7.4. CT abdomen pelvis noncontrast did not show any acute intra-abdominal or intrapelvic process on this examination limited by the lack of intravenous and oral contrast. ED physician discussed the case with GI who recommended inpatient evaluation. TOC PLAN OF CARE: Return to home, need smoking cessation information. Following for progression bp this am 180/72 Expected Discharge Plan: Home/Self Care Barriers to Discharge: Continued Medical Work up   Patient Goals and CMS Choice        Expected Discharge Plan and Services Expected Discharge Plan: Home/Self Care       Living arrangements for the past 2 months: Single Family Home                                      Prior Living Arrangements/Services Living arrangements for the past 2 months: Single Family Home Lives with:: Self Patient language and need for interpreter reviewed:: Yes                 Activities of Daily Living Home Assistive  Devices/Equipment: Shower chair with back ADL Screening (condition at time of admission) Patient's cognitive ability adequate to safely complete daily activities?: Yes Is the patient deaf or have difficulty hearing?: No Does the patient have difficulty seeing, even when wearing glasses/contacts?: No Does the patient have difficulty concentrating, remembering, or making decisions?: No Patient able to express need for assistance with ADLs?: Yes Does the patient have difficulty dressing or bathing?: No Independently performs ADLs?: Yes (appropriate for developmental age) Does the patient have difficulty walking or climbing stairs?: Yes Weakness of Legs: Both Weakness of Arms/Hands: Both  Permission Sought/Granted                  Emotional Assessment Appearance:: Appears stated age     Orientation: : Oriented to Self, Oriented to Place, Oriented to  Time, Oriented to Situation Alcohol / Substance Use: Not Applicable Psych Involvement: No (comment)  Admission diagnosis:  Hypertensive urgency [I16.0] Gastrointestinal hemorrhage, unspecified gastrointestinal hemorrhage type [K92.2] Patient Active Problem List   Diagnosis Date Noted   AKI (acute kidney injury) ([place]) 05/11/2021   Chest pain, rule out acute myocardial infarction 05/09/2021   Abnormal EKG    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[place]) 12/30/2020   Chronic mesenteric ischemia ([place]) 12/28/2020   Polyp of ascending colon    AVM (arteriovenous malformation) of small bowel, acquired with hemorrhage    GIB (gastrointestinal bleeding) 08/23/2020   Hematochezia  Diverticulosis of colon without hemorrhage    Pain of upper abdomen    Angiodysplasia of colon 08/16/2020   GI bleed 08/06/2020   Acute GI bleeding 08/05/2020   Acute blood loss anemia 08/05/2020   Renal artery stenosis ([facility]) 05/10/2020   Abdominal aortic stenosis 11/30/2018   Pure hypercholesterolemia 10/22/2018   Abnormal stress test 03/21/2018   At high risk for  injury related to fall 12/26/2017   Renal artery stenosis, native, bilateral ([place]) 10/27/2017   Benign neoplasm of transverse colon    Benign neoplasm of descending colon    Benign neoplasm of sigmoid colon    Abnormality on screening test 06/22/2016   Hypertensive urgency 11/01/2015   Chest pain 11/01/2015   History of partial colectomy 10/19/2014   Renovascular hypertension, malignant 09/07/2014   Anemia, chronic disease 07/30/2014   Heart failure with preserved ejection fraction ([place]) 07/02/2014   COPD GOLD 0 / still smoking 07/02/2014   CKD (chronic kidney disease) stage 4, GFR 15-29 ml/min ([place]) 07/02/2014   Protein-calorie malnutrition, severe ([place]) 07/02/2014   Hyperparathyroidism, 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   Postsurgical menopause 08/14/2013   Vaginal atrophy 08/14/2013   Screening for breast cancer 07/21/2012   Diverticulitis of colon with perforation 06/18/2012   Affective bipolar disorder ([place]) 02/02/2009   ADHESIONS, INTESTINAL W/OBSTRUCTION 08/10/2008   Irritable bowel syndrome 08/10/2008   Abdominal pain, generalized 08/10/2008   FECAL OCCULT BLOOD 08/10/2008   Anemia, iron deficiency 08/10/2008   Anxiety state 08/09/2008   DEPRESSION 08/09/2008   ASTHMA 08/09/2008   ESOPHAGEAL STRICTURE 08/09/2008   GERD 08/09/2008   HIATAL HERNIA 08/09/2008   Diverticulosis of large intestine 08/09/2008   ARTHRITIS 08/09/2008   HEADACHE, CHRONIC 08/09/2008   SKIN CANCER, HX OF 08/09/2008   COLONIC POLYPS, HYPERPLASTIC, HX OF 08/09/2008   Cigarette smoker 05/17/2008   Schizophrenia ([place]) 04/30/2008   PCP:  Bernerd Limbo, MD Pharmacy:   [facility], [place], [address], [place] [address] Phone: [phone] Fax: [place], [place] [address] [place] [address] Phone: [phone] Fax: [phone]     Social Determinants of Health  (SDOH) Interventions    Readmission Risk Interventions No flowsheet data found.

## 2021-06-12 NOTE — Telephone Encounter (Signed)
The pt is currently admitted

## 2021-06-12 NOTE — Consult Note (Signed)
CARDIOLOGY CONSULT NOTE  [name] MRN: [id number] DOB/AGE: Nov 10, 1960 60 y.o.  Admit date: 06/10/2021 Referring Physician  Dahal, Marlowe Aschoff, MD Primary Physician:  Bernerd Limbo, MD Reason for Consultation  Mesenteric stenosis and GI Bleed  [name], female    DOB: [birth year], 60 y.o.   MRN: [id number]  Chief Complaint  Patient presents with   Abdominal Pain   Rectal Bleeding   HPI:    [name]  is a 60 y.o. Caucasian female  with hypertension, COPD, diastolic CHF, ongoing tobacco abuse (2 cig a day), and difficult to control hypertension with CKD, with bilateral renal artery angioplasty, PAD with distal abdominal aorta stenosis S/P stenting with 10x 29 mm Omnilink Elite stent. She underwent coronary angiogram in June 2019 for nitroglycerin responsive chest pain that revealed normal coronary arteries.  History of lower GI bleed found to have colonic polyposis 08/2020 requiring 2 units of packed RBCs.   Renal arteriogram 8/21 revealed occluded left renal artery status post partial successful balloon angioplasty.  Followed by vascular surgery as she underwent elective stenting of SMA, terminal aorta, bilateral common iliac arteries for chronic mesenteric ischemia on 11/2020.  Patient was again admitted 05/08/2021 - 05/12/2021 with hypertensive emergency, cardiac catheterization showed nonobstructive CAD.  Now presents with recurrence of GI bleed with hemoglobin of 7.5 at presentation.  Fortunately she has not had any recurrence of GI bleed since being in the hospital, I was asked to opine regarding occluded SMA stent and high-grade stenosis of the celiac axis.  Patient denies abdominal discomfort, weight loss, early satiety.  Fortunately she has not had any chest pain, shortness of breath has remained stable, unfortunately still continues to smoke.  Past Medical History:  Diagnosis Date   Anemia 2008   Anginal pain ([place])    Anxiety    Aortic  stenosis    Arthritis    "all my joints ache" (09/07/2014)   Asthma    Bipolar 1 disorder ([place])    Blood transfusion without reported diagnosis    Bradycardia    Bruit    Carpal tunnel syndrome    Cataract    forming   Cervical cancer ([place]) 1985   CHF (congestive heart failure) ([place])    Chronic kidney disease (CKD), stage V (HCC)    COPD (chronic obstructive pulmonary disease) ([place]) 2000   Coronary artery disease    Daily headache    Depression 2000   Diverticulitis 2008   Emphysema of lung (HCC)    GERD (gastroesophageal reflux disease)    Glaucoma    Heart murmur    History of colon polyps 2009   HLD (hyperlipidemia) 2013   Hypertension 2013   Hypovitaminosis D    IBS (irritable bowel syndrome) 2008   Myocardial infarction [facility])    2015   PAD (peripheral artery disease) ([place])    Pancreatitis 10/2011   Pneumonia 07/2014   RLS (restless legs syndrome)    Schizophrenia (HCC)    Shortness of breath    Skin cancer    Small bowel obstruction ([place]) 2008   Thyroid disease    Past Surgical History:  Procedure Laterality Date   ABDOMINAL ANGIOGRAM N/A 09/07/2014   Procedure: ABDOMINAL ANGIOGRAM;  Surgeon: Laverda Page, MD;  Location: [facility] CATH LAB;  Service: Cardiovascular;  Laterality: N/A;   ABDOMINAL AORTOGRAM W/LOWER EXTREMITY N/A 12/28/2020   Procedure: ABDOMINAL AORTOGRAM W/LOWER EXTREMITY;  Surgeon: Cherre Robins, MD;  Location: [facility] CV LAB;  Service: Cardiovascular;  Laterality: N/A;   APPENDECTOMY  10/01/1994   BIOPSY  08/06/2020   Procedure: BIOPSY;  Surgeon: Jackquline Denmark, MD;  Location: [facility] ENDOSCOPY;  Service: Endoscopy;;   COLON SURGERY  10/01/2006   6 inches of colon removed due to obstruction   COLONOSCOPY WITH PROPOFOL N/A 10/25/2016   Procedure: COLONOSCOPY WITH PROPOFOL;  Surgeon: Milus Banister, MD;  Location: [facility] ENDOSCOPY;  Service: Endoscopy;  Laterality: N/A;   COLONOSCOPY WITH PROPOFOL N/A 08/07/2020   Procedure: COLONOSCOPY WITH  PROPOFOL;  Surgeon: Jackquline Denmark, MD;  Location: [facility] ENDOS[facility];  Service: Endoscopy;  Laterality: N/A;   COLONOSCOPY WITH PROPOFOL N/A 08/27/2020   Procedure: COLONOSCOPY WITH PROPOFOL;  Surgeon: Lavena Bullion, DO;  Location: [facility]OS[facility];  Service: Gastroenterology;  Laterality: N/A;   CORONARY ANGIOPLASTY WITH STENT PLACEMENT  09/07/2014   "2"   ENTEROSCOPY N/A 08/27/2020   Procedure: ENTEROSCOPY;  Surgeon: Lavena Bullion, DO;  Location: [facility] ENDOS[facility];  Service: Gastroenterology;  Laterality: N/A;  Push enteroscopy    ENTEROSCOPY N/A 06/12/2021   Procedure: ENTEROSCOPY;  Surgeon: Rush Landmark Telford Nab., MD;  [facility]: [facility];  Service: Gastroenterology;  Laterality: N/A;   ESOPHAGOGASTRODUODENOSCOPY (EGD) WITH PROPOFOL N/A 08/06/2020   Procedure: ESOPHAGOGASTRODUODENOSCOPY (EGD) WITH PROPOFOL;  Surgeon: Jackquline Denmark, MD;  [facility];  Service: Endoscopy;  Laterality: N/A;   GIVENS CAPSULE STUDY N/A 08/24/2020   Procedure: GIVENS CAPSULE STUDY;  Surgeon: Lavena Bullion, DO;  [facility]: [facility];  Service: Gastroenterology;  Laterality: N/A;   HOT HEMOSTASIS N/A 08/07/2020   Procedure: HOT HEMOSTASIS (ARGON PLASMA COAGULATION/BICAP);  Surgeon: Jackquline Denmark, MD;  Location: [facility];  Service: Endoscopy;  Laterality: N/A;   HOT HEMOSTASIS N/A 08/27/2020   Procedure: HOT HEMOSTASIS (ARGON PLASMA COAGULATION/BICAP);  Surgeon: Lavena Bullion, DO;  [facility];  Service: Gastroenterology;  Laterality: N/A;   LEFT HEART CATH AND CORONARY ANGIOGRAPHY N/A 03/25/2018   Procedure: LEFT HEART CATH AND CORONARY ANGIOGRAPHY;  Surgeon: Nigel Mormon, MD;  Location: [place] CV LAB;  Service: Cardiovascular;  Laterality: N/A;   LEFT HEART CATH AND CORONARY ANGIOGRAPHY N/A 05/09/2021   Procedure: LEFT HEART CATH AND CORONARY ANGIOGRAPHY;  Surgeon: Adrian Prows, MD;  Location: [place] CV LAB;  Service: Cardiovascular;  Laterality: N/A;   LEFT HEART  CATHETERIZATION WITH CORONARY ANGIOGRAM N/A 09/07/2014   Procedure: LEFT HEART CATHETERIZATION WITH CORONARY ANGIOGRAM;  Surgeon: Laverda Page, MD;  Location: [facility] CATH LAB;  Service: Cardiovascular;  Laterality: N/A;   LOWER EXTREMITY ANGIOGRAPHY  10/29/2017   Procedure: Lower Extremity Angiography;  Surgeon: Adrian Prows, MD;  Location: [place] CV LAB;  Service: Cardiovascular;;   PERIPHERAL VASCULAR CATHETERIZATION N/A 11/01/2015   Procedure: Renal Angiography;  Surgeon: Adrian Prows, MD;  Location: [place] CV LAB;  Service: Cardiovascular;  Laterality: N/A;   PERIPHERAL VASCULAR CATHETERIZATION N/A 04/10/2016   Procedure: Renal Angiography;  Surgeon: Adrian Prows, MD;  Location: [place] CV LAB;  Service: Cardiovascular;  Laterality: N/A;   PERIPHERAL VASCULAR CATHETERIZATION  04/10/2016   Procedure: Peripheral Vascular Intervention;  Surgeon: Adrian Prows, MD;  Location: [place] CV LAB;  Service: Cardiovascular;;   PERIPHERAL VASCULAR INTERVENTION  10/29/2017   Procedure: PERIPHERAL VASCULAR INTERVENTION;  Surgeon: Adrian Prows, MD;  Location: [place] CV LAB;  Service: Cardiovascular;;   PERIPHERAL VASCULAR INTERVENTION Bilateral 12/28/2020   Procedure: PERIPHERAL VASCULAR INTERVENTION;  Surgeon: Cherre Robins, MD;  Location: [place] CV LAB;  Service: Cardiovascular;  Laterality: Bilateral;  POLYPECTOMY  08/07/2020   Procedure: POLYPECTOMY;  Surgeon: Jackquline Denmark, MD;  [facility];  Service: Endoscopy;;   POLYPECTOMY  08/27/2020   Procedure: POLYPECTOMY;  Surgeon: Lavena Bullion, DO;  [facility];  Service: Gastroenterology;;   POLYPECTOMY     RENAL ANGIOGRAPHY N/A 10/29/2017   Procedure: RENAL ANGIOGRAPHY;  Surgeon: Adrian Prows, MD;  Location: [place] CV LAB;  Service: Cardiovascular;  Laterality: N/A;   RENAL ANGIOGRAPHY N/A 05/10/2020   Procedure: RENAL ANGIOGRAPHY;  Surgeon: Nigel Mormon, MD;  Location: [place] CV LAB;   Service: Cardiovascular;  Laterality: N/A;   RIGHT OOPHORECTOMY Right 10/01/1994   SUBMUCOSAL TATTOO INJECTION  06/12/2021   Procedure: SUBMUCOSAL TATTOO INJECTION;  Surgeon: Irving Copas., MD;  [facility]: [facility];  Service: Gastroenterology;;   TOTAL ABDOMINAL HYSTERECTOMY  10/02/1995   UPPER GASTROINTESTINAL ENDOSCOPY     Family History  Problem Relation Age of Onset   Other Mother        many bowel obstructions   Heart disease Mother    Colon polyps Mother    Kidney cancer Father    Bone cancer Father    Diabetes Father    Heart disease Father    Heart disease Brother    Colon cancer Paternal Grandfather    Diabetes Daughter    Esophageal cancer Neg Hx    Rectal cancer Neg Hx    Stomach cancer Neg Hx    Social History   Tobacco Use   Smoking status: Some Days    Packs/day: 1.50    Years: 40.00    Pack years: 60.00    Types: Cigarettes   Smokeless tobacco: Never  Substance Use Topics   Alcohol use: No    Marital Sttus: Divorced  ROS  Review of Systems  Constitutional: Positive for malaise/fatigue. Negative for weight loss.  Eyes: Negative.   Cardiovascular:  Positive for dyspnea on exertion and near-syncope. Negative for chest pain.  Respiratory:  Positive for cough.   Endocrine: Negative.   Musculoskeletal: Negative.   Gastrointestinal:  Positive for abdominal pain, hematemesis, hematochezia and melena.  Psychiatric/Behavioral:  Positive for depression.   Objective   Vitals with BMI 06/13/2021 06/13/2021 06/13/2021  Height - - -  Weight - - -  BMI - - -  Systolic 268 - 341  Diastolic 71 - 84  Pulse 57 62 71    Blood pressure (!) 157/71, pulse (!) 57, temperature 97.8 F (36.6 C), temperature source Oral, resp. rate 17, height 5\' 2"  (1.575 m), weight 40 kg, SpO2 93 %.  Body mass index is 16.13 kg/m.    Physical Exam Constitutional:      General: She is not in acute distress.    Appearance: She is underweight. She is ill-appearing.  Neck:      Vascular: No carotid bruit or JVD.  Cardiovascular:     Rate and Rhythm: Normal rate and regular rhythm.     Pulses: Intact distal pulses.     Heart sounds: Normal heart sounds. No murmur heard.   No gallop.     Comments: Abdominal bruit present  Pulmonary:     Effort: Pulmonary effort is normal.     Breath sounds: Normal breath sounds.  Abdominal:     General: Bowel sounds are normal.     Palpations: Abdomen is soft.  Musculoskeletal:        General: No swelling.   Laboratory examination:   Recent Labs    06/24/20 1646 08/04/20  2211 06/12/21 0252 06/12/21 0806 06/13/21 0241  NA 141   < > 133* 135 135  K 5.5*   < > 5.6* 4.9 4.6  CL 108*   < > 107 110 108  CO2 18*   < > 20* 20* 19*  GLUCOSE 116*   < > 90 87 89  BUN 27*   < > 37* 36* 33*  CREATININE 2.41*   < > 3.44* 3.58* 3.49*  CALCIUM 9.0   < > 8.7* 8.3* 8.4*  GFRNONAA 22*   < > 15* 14* 15*  GFRAA 25*  --   --   --   --    < > = values in this interval not displayed.   estimated creatinine clearance is 11.1 mL/min (A) (by C-G formula based on SCr of 3.49 mg/dL (H)).  CMP Latest Ref Rng & Units 06/13/2021 06/12/2021 06/12/2021  Glucose 70 - 99 mg/dL 89 87 90  BUN 6 - 20 mg/dL 33(H) 36(H) 37(H)  Creatinine 0.44 - 1.00 mg/dL 3.49(H) 3.58(H) 3.44(H)  Sodium 135 - 145 mmol/L 135 135 133(L)  Potassium 3.5 - 5.1 mmol/L 4.6 4.9 5.6(H)  Chloride 98 - 111 mmol/L 108 110 107  CO2 22 - 32 mmol/L 19(L) 20(L) 20(L)  Calcium 8.9 - 10.3 mg/dL 8.4(L) 8.3(L) 8.7(L)  Total Protein 6.5 - 8.1 g/dL - - -  Total Bilirubin 0.3 - 1.2 mg/dL - - -  Alkaline Phos 38 - 126 U/L - - -  AST 15 - 41 U/L - - -  ALT 0 - 44 U/L - - -   CBC Latest Ref Rng & Units 06/13/2021 06/12/2021 06/11/2021  WBC 4.0 - 10.5 K/uL 5.6 5.9 -  Hemoglobin 12.0 - 15.0 g/dL 9.8(L) 10.8(L) 10.1(L)  Hematocrit 36.0 - 46.0 % 30.0(L) 32.2(L) 29.7(L)  Platelets 150 - 400 K/uL 254 277 -    Medications and allergies   Allergies  Allergen Reactions   Doxycycline  Anaphylaxis and Hives   Hydralazine Rash   Hydrocodone-Acetaminophen Nausea And Vomiting   Iohexol Itching    Pt has itching nose after iv contrast injection     Current Meds  Medication Sig   acetaminophen (TYLENOL) 500 MG tablet Take 1 tablet (500 mg total) by mouth every 8 (eight) hours as needed for mild pain or headache.   albuterol (PROVENTIL HFA;VENTOLIN HFA) 108 (90 BASE) MCG/ACT inhaler Inhale 2 puffs into the lungs every 6 (six) hours as needed for wheezing or shortness of breath (asthma).   ALPRAZolam (XANAX) 1 MG tablet Take 0.5-1 tablets by mouth 2 (two) times daily.   amLODipine (NORVASC) 10 MG tablet TAKE 1 TABLET BY MOUTH ONCE DAILY (Patient taking differently: Take 10 mg by mouth every morning.)   aspirin EC 81 MG EC tablet Take 1 tablet (81 mg total) by mouth daily. Swallow whole.   atorvastatin (LIPITOR) 80 MG tablet Take 80 mg by mouth every morning.   azelastine (ASTELIN) 0.1 % nasal spray Place 1 spray into both nostrils daily as needed (seasonal allergies).   ezetimibe (ZETIA) 10 MG tablet TAKE 1 TABLET BY MOUTH ONCE A DAY (Patient taking differently: Take 10 mg by mouth every morning.)   isosorbide mononitrate (IMDUR) 60 MG 24 hr tablet Take 0.5 tablets (30 mg total) by mouth daily. (Patient taking differently: Take 30 mg by mouth every morning.)   labetalol (NORMODYNE) 100 MG tablet Take 1 tablet (100 mg total) by mouth 2 (two) times daily.   linaclotide (LINZESS) 72 MCG capsule  Take 1 capsule (72 mcg total) by mouth daily before breakfast.   mometasone-formoterol (DULERA) 100-5 MCG/ACT AERO Take 2 puffs first thing in am and then another 2 puffs about 12 hours later. (Patient taking differently: Inhale 2 puffs into the lungs See admin instructions. Inhale 2 puffs into the lungs every morning, may also inhale 2 puffs in the afternoon as needed for shortness of breath)   nitroGLYCERIN (NITROSTAT) 0.4 MG SL tablet Place 1 tablet (0.4 mg total) under the tongue every 5  (five) minutes as needed for chest pain.   oxyCODONE-acetaminophen (PERCOCET/ROXICET) 5-325 MG tablet Take 1 tablet by mouth every 6 (six) hours as needed for moderate pain.   pantoprazole (PROTONIX) 40 MG tablet TAKE 1 TABLET BY MOUTH TWICE (2) DAILY (Patient taking differently: Take 40 mg by mouth 2 (two) times daily.)   promethazine (PHENERGAN) 25 MG tablet Take 25 mg by mouth 2 (two) times daily as needed for nausea or vomiting.   venlafaxine XR (EFFEXOR XR) 150 MG 24 hr capsule Take 2 capsules (300 mg total) by mouth daily with breakfast.    Scheduled Meds:  ALPRAZolam  0.5-1 mg Oral BID   amLODipine  10 mg Oral q morning   atorvastatin  80 mg Oral q morning   Chlorhexidine Gluconate Cloth  6 each Topical Q2000   ezetimibe  10 mg Oral q morning   feeding supplement  1 Container Oral BID BM   feeding supplement  237 mL Oral Q24H   isosorbide mononitrate  60 mg Oral Daily   labetalol  200 mg Oral BID   mouth rinse  15 mL Mouth Rinse BID   multivitamin with minerals  1 tablet Oral Daily   nicotine  21 mg Transdermal Daily   pantoprazole (PROTONIX) IV  40 mg Intravenous Q12H   venlafaxine XR  150 mg Oral Q breakfast   Continuous Infusions:  sodium chloride 75 mL/hr at 06/13/21 0812   PRN Meds:.acetaminophen **OR** acetaminophen, albuterol, HYDROmorphone (DILAUDID) injection, labetalol, oxyCODONE-acetaminophen   I/O last 3 completed shifts: In: 2124.2 [P.O.:720; I.V.:1287.2; IV Piggyback:117] Out: -  Total I/O In: 684.6 [I.V.:684.6] Out: -     Radiology:   CT ABDOMEN PELVIS WO CONTRASt 06/10/2021 1. No acute intra-abdominal or intrapelvic process on this examination limited by the lack of intravenous and oral contrast.  2. Stable left renal atrophy.  3.  Aortic Atherosclerosis (ICD10-I70.0).   VAS [place] MESENTERIC 06/12/2021 Mesenteric: 70 to 99% stenosis in the superior mesenteric artery stent. 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  Cardiac Studies:   Abdominal  aortogram 10/29/2017: Widely patent renal arteries. Distal abdominal aorta diffuse disease, calcification with 80% stenosis S/P 10 x 29 mm Omnilink Elite stent, 80% to 0%.    Lexiscan myoview stress test 02/17/2018:  1. Lexiscan stress test was performed. Exercise capacity was not assessed. Stress symptoms included dizziness, nausea, headache, and chest pressure.. Peak blood pressure was 176/88 mmHg. Stress EKG is non diagnostic for ischemia as it is a pharmacologic stress. In addition, it showed The stress electrocardiogram showed sinus tachycardia, normal stress conduction, left ventricular hypertrophy, no stress arrhythmias and normal stress repolarization. 2. The overall quality of the study is excellent. Left ventricular cavity is noted to be normal on the rest and stress studies. Gated SPECT images reveal normal myocardial thickening and wall motion. The left ventricular ejection fraction was calculated or visually estimated to be 78%. SPECT images demonstrate small perfusion abnormality of moderate intensity in the mid anterolateral and apical  lateral myocardial wall(s) on the stress images, reversible on rest images. Findings suggest small area of moderate intensity ischemia in mid to apical anterolateral myocardium. Intermediate risk study.   Echocardiogram 10/16/2019: Normal LV systolic function with EF 60%. Left ventricle cavity is normal in size. Normal global wall motion. Normal diastolic filling pattern. Calculated EF 60%. Left atrial cavity is mildly dilated in 4 chamber views. Structurally normal mitral valve.  Mild (Grade I) mitral regurgitation. Structurally normal tricuspid valve.  Mild tricuspid regurgitation. No evidence of pulmonary hypertension. Compared to 02/25/2017, no significant change.   Renal angiogram 05/10/2020: Patent Rt renal stent (placed 2015,  6.0 x 15 mm Herculink) 100% restenosis of Lt renals stent with flush occlusion 6.0 x 18 mm Herculink)   Successful  angioplasty Lt renal stent Antegrade dissection in distal Lt renal artery with decreased distal flow, too distal for stenting. There is blush in left kidney. She will be monitored for 6-8 hrs for any signs of renal infarct.   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 Stenosis in the  right external carotid artery (<50%).  Stenosis in the left internal carotid artery (16-49%). Stenosis in the  left external carotid artery (<50%).  Antegrade right vertebral artery flow. Antegrade left vertebral artery  flow.  Follow up in 12 months is appropriate if clinically indicated.  Compared to the study done on 04/08/2020, left ICA stenosis was in the  range of 50-69%., now <50%.   Mesenteric Duplex 12/20/2020: :  Velocities suggest 70 to 99% stenosis in the superior mesenteric artery and >50% stenosis in the distal aorta. Extensive plaque was noted throughout abdominal aorta.   Abdominal aortogram with lower extremity and peripheral vascular intervention 12/28/2020: Aortogram reveals prior terminal aortic stenting prior renal stenting CO2 aortography performed for majority of diagnostic studies 70% SMA stenosis. 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 95% right common iliac stenosis Unremarkable SMA stenting. Unable to track a stent into the celiac artery.  I elected to not continue because of the risk of contrast nephropathy. Right common iliac stenosis stented.  Post stenting we noted perforation with extravasation in the right common iliac artery as well as ulceration in the aortic stent. I elected to cover all of these lesions with terminal aortic stenting and bilateral common iliac artery stenting. Angiogram shows excellent flow through the terminal aorta and through the aortic bifurcation.   PLAN: Successful SMA revascularization.  Unfortunately required aorto and bilateral common iliac artery stenting.  Admit 4E. Needs dual antiplatelet therapy and high intensity  statin going forward.  Continue IV hydration.  Check basic metabolic panel in the morning.  Give diet and see how she tolerates food.   Mesenteric duplex 01/16/2021: SMA stent not well visualized, however SMA is patent with no evidence of stenosis. Aortoiliac stent is patent with no evidence of stenosis. >50% stenosis is noted distal to the right CIA stent.   Left Heart Catheterization 05/09/21:  LV: 108/2, EDP 12 mmHg.  Ao 111/58, mean 106 mmHg.  No pressure gradient across the aortic valve.  LVEDP is normal.  LV gram not performed to conserve contrast. LM: Large-caliber vessel, smooth and normal. LAD: Very large caliber vessel, gives origin to a large D1 and several small diagonals.  The LAD has mild proximal coronary calcification without luminal obstruction.  The LAD and diagonal are tortuous. Circumflex: There is minimal ectasia noted in the midsegment otherwise smooth and normal.  Gives origin to a moderate-sized OM1 and a large OM 2.  RCA: [place] has a 30% calcific stenosis.  No damping of pressure in spite of the catheter being placed deep inside the artery.  Otherwise RCA smooth and mildly tortuous.  Impression: Abnormal EKG and positive cardiac markers is due to demand ischemia from hypertensive urgency.  With the same home dose medications, blood pressure is now well controlled and hence I am beginning to suspect whether compliance is an issue with medications.  I have reduced the dose of amlodipine to avoid hypotension as she had an episode this morning. Only 10 mL of contrast was utilized and patient was administered 1 L of Ringer's lactate for hydration in view of low EDP/normal EDP (low for her).  From cardiac standpoint she can come off of Plavix as she is now having significant GI issues.  If abdominal discomfort persist, she may need GI consult.  PCV MYOCARDIAL PERFUSION WITH LEXISCAN 05/22/2021 Lexiscan nuclear stress test performed using 1-day protocol. Patient was hypertensive  throughout the study up to 200/100 mmHg. Stress EKG is non-diagnostic, as this is pharmacological stress test. In addition, rest and stress EKG showed sinus rhythm, left ventricular hypertrophy, nondiagnostic lateral ST depression <1 mm. Normal myocardial perfusion. Stress LVEF 50%. Low risk study.  EKG:  05/08/2021: Sinus tachycardia at rate of 122 bpm, normal axis, 2 mm downsloping inferior and lateral ST segment depression consistent with subendocardial ischemia versus subendocardial infarct.  Voltage criteria for LVH.  Compared to prior EKG 05/12/2021, ST changes are new.  Assessment   1.  Peripheral arterial disease with bilateral renal artery stenting and renovascular hypertension, occluded graft and artery stent 2.  Occluded or nearly occlusive SMA stent and high-grade stenosis of the celiac axis, not the etiology for acute presentation with abdominal pain and GI bleed as there is no ischemic gut.  Patient presently off of aspirin and Plavix for the past 4 weeks when she initially presented with GI bleed. 3.  Tobacco use disorder 4.  Resident hypertension   Recommendations:   Patient with known severe peripheral artery disease and ongoing tobacco use disorder admitted with GI bleed.  Etiology for GI bleed is probably related to small vessel disease and probably chronic intestinal ischemia.  Do not suspect acute abdomen from ischemia.  She has been off of dual antiplatelet therapy for greater than a month.  Would not recommend restarting DAPT for now.  Not a candidate for repeat angiography as she will not be a candidate for any antithrombotic agents or antiplatelet agent therapy.  Conservative management, advance her diet and if she has recurrent bleed, would recommend further evaluation to exclude any AV malformation or other etiology for GI bleed.  She has resting hypertension, she is on appropriate medical therapy.  Will increase labetalol to 200 mg twice daily from 100 mg twice daily.   Otherwise no other specific recommendation from cardiology.   Adrian Prows, MD, [facility] 06/13/2021, 1:27 PM Office: [phone] Fax: [phone] Pager: [phone]

## 2021-06-12 NOTE — Progress Notes (Signed)
Mesenteric vascular ultrasound study completed.  Preliminary results relayed to Dahal, MD.  See CV Proc for preliminary results report.   Darlin Coco, RDMS, RVT

## 2021-06-12 NOTE — Interval H&P Note (Signed)
History and Physical Interval Note:  06/12/2021 10:34 AM  [name]  has presented today for surgery, with the diagnosis of GI bleed, histosry of small bowel AVMs, anemia.  The various methods of treatment have been discussed with the patient and family. After consideration of risks, benefits and other options for treatment, the patient has consented to  Procedure(s): ENTEROSCOPY (N/A) as a surgical intervention.  [name] history has been reviewed, patient examined, no change in status, stable for surgery.  I have reviewed the patient's chart and labs.  Questions were answered to the patient's satisfaction.     [employer]

## 2021-06-12 NOTE — Progress Notes (Signed)
PROGRESS NOTE  [name]  DOB: Jun 10, 1961  PCP: Bernerd Limbo, MD VEL:[id number]  DOA: 06/10/2021  LOS: 2 days  Hospital Day: 3   Chief Complaint  Patient presents with   Abdominal Pain   Rectal Bleeding    Brief narrative: [name] is a 60 y.o. female with PMH significant for HTN, HLD, CHF, CAD/MI in 2015, aortic stenosis s/p stent, COPD, half a pack per day smoking, peripheral artery disease s/p SMA stent, bilateral common iliac artery stent on aspirin, chronic mesenteric ischemia, history of GI bleeding, s/p colon resection in 2004 bowel obstruction. Patient presented to the ED on 9/10 with complaint of melena and hematochezia for 4 days associated with abdominal pain, shortness of breath, palpitations, lightheadedness.   In the ED, patient had blood pressure elevated to 207/92. Labs with hemoglobin at 7.4. CT abdomen pelvis noncontrast did not show any acute intra-abdominal or intrapelvic process on this examination limited by the lack of intravenous and oral contrast. Admitted to hospital service GI consultation was obtained See below for details  Subjective: Patient was seen and examined this morning. Underwent EGD this morning. Finding as below. Continues to have mild abdomen pain and back pain.  Assessment/Plan: Acute on chronic GI bleeding History of chronic mesenteric ischemia -Presented with hematochezia, melena, FOBT positive in the ED-History of chronic GI bleeding, mesenteric ischemia, diverticulosis. -This morning, underwent EGD.  No source of bleeding identified.  Per GI, she had a digital rectal examination done and was found to have diverticular stool balls mixed with old blood. -She probably was having diverticular bleed.  Defer to GI for further work-up.   Acute on chronic blood loss anemia -Secondary to chronic recurrent GI bleeding.  Presented with a hemoglobin low at 7.4.  Hemoglobin further dipped down to 6.3.  2 units PRBC transfused.   Hemoglobin appropriately improved to 10.8 today. -I also ordered for 1 dose of IV Feraheme this morning. Recent Labs    08/22/20 1358 08/23/20 1048 02/09/21 0959 05/08/21 2208 06/10/21 1341 06/10/21 1608 06/11/21 0301 06/11/21 1233 06/12/21 0252  HGB 8.8 Repeated and verified X2.*   < > 11.2*   < > 7.4* 7.5* 6.3* 10.1* 10.8*  MCV 80.6   < > 96.3   < > 92.0 92.6 90.7  --  87.7  FERRITIN 7.9*  --  90  --   --   --   --   --   --   TIBC  --   --  293  --   --   --   --   --   --   IRON 54  --  43*  --   --   --   --   --   --    < > = values in this interval not displayed.   AKI on CKD -Creatinine at baseline less than 3.5.  Presented with a creatinine elevated to 4.3.  Probably related to poor oral intake. -Improvement with IV fluid, 3.58 today. Recent Labs    12/30/20 0738 05/08/21 2208 05/10/21 0442 05/11/21 0424 05/12/21 0216 06/10/21 1341 06/10/21 1608 06/11/21 0301 06/12/21 0252 06/12/21 0806  BUN 30* 25* 24* 25* 28* 42* 45* 41* 37* 36*  CREATININE 2.63* 2.86* 3.20* 3.41* 3.30* 4.33* 4.15* 3.87* 3.44* 3.58*   Hypertensive urgency History of CHF, aortic stenosis -Blood pressure was significantly elevated over 200 at presentation probably because of missing her meds while waiting in the ED.   -Currently on home meds  which amlodipine 10 mg daily, Imdur 30 mg daily, labetalol 100 mg twice daily.  Blood pressures remains elevated.  I increased Imdur to 60 mg this morning.  Continue to monitor.   History of CAD, PAD, HLD Hyperlipidemia -Home meds include Aspirin 81 mg daily, Lipitor 80 mg daily, Zetia 10 mg daily, -Patient states he was taken off Plavix recently after recurrent GI bleeding.  Currently aspirin on hold as well because of acute GI bleeding.  Continue statin. 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 -Discussed this morning with patient's  cardiologist Dr. Einar Gip.  Recommended to continue conservative management at this time.  Would not resume antiplatelets while bleeding.  Patient to follow-up with him as an outpatient.   COPD Chronic daily smoker -Continue bronchodilators -Smokes half a pack per day.  Counseled to quit smoking.  Nicotine patch ordered.   Anxiety/depression Xanax 0.5 mg twice daily, Effexor 300 mg daily   Chronic pain -Complains of inability to sleep last night because of back pain.   -Chronically on Percocet 5 mg every 6 hours as needed -I would add IV Dilaudid as needed for severe pain.  Also started on scheduled Robaxin for next 3 days.  Mobility: Encourage ambulation.  Previously independent Code Status:   Code Status: Full Code  Nutritional status: Body mass index is 16.13 kg/m.     Diet:  Diet Order             Diet full liquid Room service appropriate? Yes; Fluid consistency: Thin  Diet effective now                  DVT prophylaxis:  Place and maintain sequential compression device Start: 06/11/21 0721   Antimicrobials: None Fluid: Normal saline 75 mill per hour Consultants: GI Family Communication: No family at bedside today  Status is: Inpatient  Remains inpatient appropriate because: Continues to have GI bleeding.  May need further intervention.  Dispo: The patient is from: Home              Anticipated d/c is to: Home              Patient currently is not medically stable to d/c.   Difficult to place patient No     Infusions:   sodium chloride 75 mL/hr at 06/12/21 0550   ferumoxytol      Scheduled Meds:  ALPRAZolam  0.5-1 mg Oral BID   amLODipine  10 mg Oral q morning   atorvastatin  80 mg Oral q morning   Chlorhexidine Gluconate Cloth  6 each Topical Q2000   ezetimibe  10 mg Oral q morning   isosorbide mononitrate  60 mg Oral Daily   labetalol  100 mg Oral BID   mouth rinse  15 mL Mouth Rinse BID   nicotine  21 mg Transdermal Daily   pantoprazole  (PROTONIX) IV  40 mg Intravenous Q12H   venlafaxine XR  150 mg Oral Q breakfast    Antimicrobials: Anti-infectives (From admission, onward)    None       PRN meds: acetaminophen **OR** acetaminophen, albuterol, HYDROmorphone (DILAUDID) injection, labetalol, oxyCODONE-acetaminophen   Objective: Vitals:   06/12/21 1159 06/12/21 1200  BP:    Pulse:  63  Resp:  12  Temp: 97.9 F (36.6 C)   SpO2:  95%    Intake/Output Summary (Last 24 hours) at 06/12/2021  [place] filed at 06/12/2021 1116 Gross per 24 hour  Intake 1855.19 ml  Output --  Net 1855.19 ml   Filed Weights   06/10/21 1712 06/10/21 2100 06/12/21 1031  Weight: 42 kg 40.8 kg 40 kg   Weight change:  Body mass index is 16.13 kg/m.   Physical Exam: General exam: Pleasant, middle-aged Caucasian female. Skin: No rashes, lesions or ulcers. HEENT: Atraumatic, normocephalic, no obvious bleeding Lungs: Clear to auscultation bilaterally CVS: Regular rate and rhythm, no murmur GI/Abd soft, mild diffuse tenderness, nondistended, bowel sound present Back: Mild paraspinal tenderness on lower back bilaterally CNS: Alert, awake, oriented x3 Psychiatry: Mood appropriate Extremities: No pedal edema, no calf tenderness  Data Review: I have personally reviewed the laboratory data and studies available.  Recent Labs  Lab 06/10/21 1341 06/10/21 1608 06/11/21 0301 06/11/21 1233 06/12/21 0252  WBC 7.0 7.3 4.9  --  5.9  NEUTROABS  --  4.7  --   --   --   HGB 7.4* 7.5* 6.3* 10.1* 10.8*  HCT 22.9* 22.5* 18.6* 29.7* 32.2*  MCV 92.0 92.6 90.7  --  87.7  PLT 347 337 270  --  277   Recent Labs  Lab 06/10/21 1341 06/10/21 1608 06/11/21 0301 06/12/21 0252 06/12/21 0806  NA 133* 133* 135 133* 135  K 3.7 3.1* 3.9 5.6* 4.9  CL 101 98 104 107 110  CO2 22 22 21* 20* 20*  GLUCOSE 109* 117* 84 90 87  BUN 42* 45* 41* 37* 36*  CREATININE 4.33* 4.15* 3.87* 3.44* 3.58*  CALCIUM 8.1* 8.2* 8.2* 8.7* 8.3*    F/u labs  ordered Unresulted Labs (From admission, onward)     Start     Ordered   06/13/21 0500  Type and screen [place]  Once,   STAT       Comments: [place]    06/12/21 1125   06/11/21 3154  Basic metabolic panel  Daily,   R      06/10/21 1939   06/11/21 0500  CBC  Daily,   R      06/10/21 1939            Signed, Terrilee Croak, MD [facility] 06/12/2021

## 2021-06-12 NOTE — Op Note (Signed)
[facility] Patient Name: [name] Procedure Date: 06/12/2021 MRN: [id number] Attending MD: Justice Britain , MD Date of Birth: [birth year] CSN: [id number] Age: 60 Admit Type: Inpatient Procedure:                Small bowel enteroscopy Indications:              Acute post hemorrhagic anemia, Hematochezia,                            Melena, Occult blood in stool, Exclusion of                            angioectasia Providers:                Justice Britain, MD, Elmer Ramp. Tilden Dome, RN,                            Laverda Sorenson, Technician, Arnoldo Hooker, CRNA Referring MD:             Milus Banister, MD, Jackquline Denmark, MD, [facility] Medicines:                Monitored Anesthesia Care Complications:            No immediate complications. Estimated Blood Loss:     Estimated blood loss was minimal. Procedure:                Pre-Anesthesia Assessment:                           - Prior to the procedure, a History and Physical                            was performed, and patient medications and                            allergies were reviewed. [name] tolerance of                            previous anesthesia was also reviewed. The risks                            and benefits of the procedure and the sedation                            options and risks were discussed with the patient.                            All questions were answered, and informed consent                            was obtained. Prior Anticoagulants: The patient has  taken no previous anticoagulant or antiplatelet                            agents. ASA Grade Assessment: III - A patient with                            severe systemic disease. After reviewing the risks                            and benefits, the patient was deemed in                            satisfactory condition to undergo the procedure.                            After obtaining informed consent, the endoscope was                            passed under direct vision. Throughout the                            procedure, the patient's blood pressure, pulse, and                            oxygen saturations were monitored continuously. The                            PCF-HQ190L ([id number]) Olympus colonoscope was                            introduced through the mouth and advanced to the                            proximal jejunum. Scope In: Scope Out: Findings:      No gross lesions were noted in the entire esophagus.      A non-obstructing Schatzki ring was found at the gastroesophageal       junction.      A 3 cm hiatal hernia was found. The proximal extent of the gastric folds       (end of tubular esophagus) was 35 cm from the incisors. The hiatal       narrowing was 38 cm from the incisors. The Z-line was 35 cm from the       incisors.      No gross lesions were noted in the entire examined stomach.      Normal mucosa was found in the entire duodenum.      Normal mucosa was found in the proximal jejunum. Area was tattooed with       an injection of Spot (carbon black) to demarcate the distal extent of       today's SBE.      A digital rectal examination was performed. Hemorrhoidal tissue noted.       She has evidence of diverticular stool balls with brownish coloration       admixed with what appears to be old blood when crushed. There is no  overt BRB. Impression:               - No gross lesions in esophagus.                           - Non-obstructing Schatzki ring.                           - 3 cm hiatal hernia.                           - No gross lesions in the stomach.                           - Normal mucosa was found in the entire examined                            duodenum.                           - Normal mucosa was found in the proximal jejunum.                            Tattooed distal extent.                            - I suspect bleeding could have been diverticular                            in origin vs ischemic in origin (hard to say                            without ability to pursue IV contrast at time of                            last colonoscopy). Not clear that there is benefit                            with repeat colonoscopy at this time, while patient                            has not passed a BM in 24 hours per her report, so                            will not plan as it has been done within the last                            6-weeks (with good preparation). When she starts                            having bowel movements, I suspect that the water                            will become blood-tinged from what  I saw today. I                            am not worried about this unless there is a                            concomittant change in hemodynamics, severe and                            rapid bleeding is noted per rectum, with annotated                            decrease in Hgb as well. Recommendation:           - The patient will be observed post-procedure,                            until all discharge criteria are met.                           - Return patient to hospital ward for ongoing care.                           - Full liquid diet.                           - Trend Hgb/Hct for now.                           - Recommend single dose of IV Iron while in-house                            for optimization of Iron stores. Oral Iron can be                            started in 1-week post discharge.                           - Follow up doppler studies.                           - As patient has been off anti-PLT therapy from                            last month, if able to remain off of therapy, would                            be ideal for at least the next 48 hours. If any                            anti-PLT therapy is required, would initiate                             Aspirin first rather than Plavix if possible.  However, ultimate decision will be based on                            medicine/cardiology/vascular surgery.                           - Inpatient GI service to follow up with patient                            tomorrow. Allowing a full liquid diet today as she                            is monitored with her Hgb. If Hgb is stable                            tomorrow AM, then she can be moved to a full diet,                            as Colonoscopy is not likely to be pursued inpatient                           - The findings and recommendations were discussed                            with the patient.                           - The findings and recommendations were discussed                            with the referring physician. Procedure Code(s):        --- Professional ---                           [id number], Small intestinal endoscopy, enteroscopy                            beyond second portion of duodenum, not including                            ileum; diagnostic, including collection of                            specimen(s) by brushing or washing, when performed                            (separate procedure)                           44799, Unlisted procedure, small intestine Diagnosis Code(s):        --- Professional ---                           K22.2, Esophageal obstruction  K44.9, Diaphragmatic hernia without obstruction or                            gangrene                           D62, Acute posthemorrhagic anemia                           K92.1, Melena (includes Hematochezia)                           R19.5, Other fecal abnormalities CPT copyright 2019 [employer] Association. All rights reserved. The codes documented in this report are preliminary and upon coder review may  be revised to meet current compliance requirements. Justice Britain, MD 06/12/2021  11:24:13 AM Number of Addenda: 0

## 2021-06-12 NOTE — Anesthesia Postprocedure Evaluation (Signed)
Anesthesia Post Note  Patient: [name]  Procedure(s) Performed: ENTEROSCOPY SUBMUCOSAL TATTOO INJECTION     Patient location during evaluation: PACU Anesthesia Type: MAC Level of consciousness: awake and alert Pain management: pain level controlled Vital Signs Assessment: post-procedure vital signs reviewed and stable Respiratory status: spontaneous breathing, nonlabored ventilation, respiratory function stable and patient connected to nasal cannula oxygen Cardiovascular status: stable and blood pressure returned to baseline Postop Assessment: no apparent nausea or vomiting Anesthetic complications: no   No notable events documented.  Last Vitals:  Vitals:   06/12/21 1116 06/12/21 1126  BP: (!) 151/72 (!) 172/76  Pulse: (!) 57 (!) 57  Resp: 14 14  Temp: (!) 36.4 C   SpO2: 100% 98%    Last Pain:  Vitals:   06/12/21 1126  TempSrc:   PainSc: 0-No pain                 Jemma Rasp S

## 2021-06-12 NOTE — Transfer of Care (Signed)
Immediate Anesthesia Transfer of Care Note  Patient: [name]  Procedure(s) Performed: ENTEROSCOPY SUBMUCOSAL TATTOO INJECTION  Patient Location: PACU and Endoscopy Unit  Anesthesia Type:MAC  Level of Consciousness: awake, alert , oriented and patient cooperative  Airway & Oxygen Therapy: Patient Spontanous Breathing and Patient connected to face mask oxygen  Post-op Assessment: Report given to RN, Post -op Vital signs reviewed and stable and Patient moving all extremities  Post vital signs: Reviewed and stable  Last Vitals:  Vitals Value Taken Time  BP    Temp    Pulse    Resp    SpO2      Last Pain:  Vitals:   06/12/21 1031  TempSrc: Oral  PainSc: 0-No pain         Complications: No notable events documented.

## 2021-06-13 ENCOUNTER — Ambulatory Visit: Payer: Medicare Other | Admitting: Student

## 2021-06-13 ENCOUNTER — Encounter (HOSPITAL_COMMUNITY): Payer: Self-pay | Admitting: Gastroenterology

## 2021-06-13 DIAGNOSIS — D62 Acute posthemorrhagic anemia: Secondary | ICD-10-CM

## 2021-06-13 DIAGNOSIS — K5790 Diverticulosis of intestine, part unspecified, without perforation or abscess without bleeding: Secondary | ICD-10-CM

## 2021-06-13 DIAGNOSIS — R195 Other fecal abnormalities: Secondary | ICD-10-CM

## 2021-06-13 DIAGNOSIS — I160 Hypertensive urgency: Secondary | ICD-10-CM | POA: Diagnosis not present

## 2021-06-13 LAB — BASIC METABOLIC PANEL
Anion gap: 8 (ref 5–15)
BUN: 33 mg/dL — ABNORMAL HIGH (ref 6–20)
CO2: 19 mmol/L — ABNORMAL LOW (ref 22–32)
Calcium: 8.4 mg/dL — ABNORMAL LOW (ref 8.9–10.3)
Chloride: 108 mmol/L (ref 98–111)
Creatinine, Ser: 3.49 mg/dL — ABNORMAL HIGH (ref 0.44–1.00)
GFR, Estimated: 15 mL/min — ABNORMAL LOW (ref >60)
Glucose, Bld: 89 mg/dL (ref 70–99)
Potassium: 4.6 mmol/L (ref 3.5–5.1)
Sodium: 135 mmol/L (ref 135–145)

## 2021-06-13 LAB — CBC
HCT: 30 % — ABNORMAL LOW (ref 36.0–46.0)
Hemoglobin: 9.8 g/dL — ABNORMAL LOW (ref 12.0–15.0)
MCH: 29.4 pg (ref 26.0–34.0)
MCHC: 32.7 g/dL (ref 30.0–36.0)
MCV: 90.1 fL (ref 80.0–100.0)
Platelets: 254 10*3/uL (ref 150–400)
RBC: 3.33 MIL/uL — ABNORMAL LOW (ref 3.87–5.11)
RDW: 16.8 % — ABNORMAL HIGH (ref 11.5–15.5)
WBC: 5.6 10*3/uL (ref 4.0–10.5)
nRBC: 0 % (ref 0.0–0.2)

## 2021-06-13 LAB — TYPE AND SCREEN
ABO/RH(D): O NEG
Antibody Screen: NEGATIVE

## 2021-06-13 MED ORDER — LABETALOL HCL 200 MG PO TABS: 200.0000 mg | ORAL_TABLET | Freq: Two times a day (BID) | ORAL | Status: DC

## 2021-06-13 MED ORDER — ISOSORBIDE MONONITRATE ER 60 MG PO TB24: 60.0000 mg | ORAL_TABLET | Freq: Every day | ORAL | 0 refills | Status: DC

## 2021-06-13 MED ORDER — ADULT MULTIVITAMIN W/MINERALS CH: 1.0000 | ORAL_TABLET | Freq: Every day | ORAL | Status: DC

## 2021-06-13 MED ORDER — BOOST / RESOURCE BREEZE PO LIQD CUSTOM: 1.0000 | Freq: Two times a day (BID) | ORAL | Status: DC

## 2021-06-13 MED ORDER — OXYCODONE-ACETAMINOPHEN 5-325 MG PO TABS: 1.0000 | ORAL_TABLET | Freq: Four times a day (QID) | ORAL | 0 refills | Status: AC | PRN

## 2021-06-13 MED ORDER — LABETALOL HCL 200 MG PO TABS: 200.0000 mg | ORAL_TABLET | Freq: Two times a day (BID) | ORAL | 0 refills | Status: DC

## 2021-06-13 MED ORDER — ENSURE ENLIVE PO LIQD: 237.0000 mL | ORAL | 12 refills | Status: DC

## 2021-06-13 MED ORDER — ENSURE ENLIVE PO LIQD: 237.0000 mL | ORAL | Status: DC

## 2021-06-13 NOTE — Discharge Summary (Signed)
Physician Discharge Summary  [name] TLX:[id number] DOB: 10-Dec-1960 DOA: 06/10/2021  PCP: Bernerd Limbo, MD  Admit date: 06/10/2021 Discharge date: 06/13/2021  Admitted From: Home Discharge disposition: Home   Code Status: Full Code   Discharge Diagnosis:   Active Problems:   Hypertensive urgency   Chief Complaint  Patient presents with   Abdominal Pain   Rectal Bleeding    Brief narrative: [name] is a 60 y.o. female with PMH significant for HTN, HLD, CHF, CAD/MI in 2015, aortic stenosis s/p stent, COPD, half a pack per day smoking, peripheral artery disease s/p SMA stent, bilateral common iliac artery stent on aspirin, chronic mesenteric ischemia, history of GI bleeding, s/p colon resection in 2004 bowel obstruction. Patient presented to the ED on 9/10 with complaint of melena and hematochezia for 4 days associated with abdominal pain, shortness of breath, palpitations, lightheadedness.   In the ED, patient had blood pressure elevated to 207/92. Labs with hemoglobin at 7.4. CT abdomen pelvis noncontrast did not show any acute intra-abdominal or intrapelvic process on this examination limited by the lack of intravenous and oral contrast. Admitted to hospital service GI consultation was obtained See below for details  Subjective: Patient was seen and examined this morning. Sitting up in bed this morning.  Not in distress.  Had a bowel movement last night without blood.  Hemoglobin stable. Tolerated diet this afternoon.  Feels ready to go home.  Hospital course Acute on chronic GI bleeding History of chronic mesenteric ischemia -Presented with hematochezia, melena, FOBT positive in the ED-History of chronic GI bleeding, mesenteric ischemia, diverticulosis. -9/12, underwent EGD.  No source of bleeding identified.  Per GI, she had a digital rectal examination done and was found to have diverticular stool balls mixed with old blood.  Monitored for next 24  hours.  No recurrence of bleeding. -She probably was having diverticular bleed.  No need of further inpatient GI work-up at this time.   Acute on chronic blood loss anemia -Secondary to chronic recurrent GI bleeding.  Presented with a hemoglobin low at 7.4.  Hemoglobin further dipped down to 6.3.  2 units PRBC transfused.  Hemoglobin appropriately improved.  Also given 1 dose of IV Feraheme on 9/12.  Oral iron at discharge. Recent Labs    08/22/20 1358 08/23/20 1048 02/09/21 0959 05/08/21 2208 06/10/21 1608 06/11/21 0301 06/11/21 1233 06/12/21 0252 06/13/21 0241  HGB 8.8 Repeated and verified X2.*   < > 11.2*   < > 7.5* 6.3* 10.1* 10.8* 9.8*  MCV 80.6   < > 96.3   < > 92.6 90.7  --  87.7 90.1  FERRITIN 7.9*  --  90  --   --   --   --   --   --   TIBC  --   --  293  --   --   --   --   --   --   IRON 54  --  43*  --   --   --   --   --   --    < > = values in this interval not displayed.   AKI on CKD -Creatinine at baseline less than 3.5.  Presented with a creatinine elevated to 4.3.  Probably related to poor oral intake. -Improving with IV fluid, 3.49 today. Recent Labs    05/08/21 2208 05/10/21 0442 05/11/21 0424 05/12/21 0216 06/10/21 1341 06/10/21 1608 06/11/21 0301 06/12/21 0252 06/12/21 0806 06/13/21 0241  BUN 25*  24* 25* 28* 42* 45* 41* 37* 36* 33*  CREATININE 2.86* 3.20* 3.41* 3.30* 4.33* 4.15* 3.87* 3.44* 3.58* 3.49*   Hypertensive urgency History of CHF, aortic stenosis -Blood pressure was significantly elevated over 200 at presentation probably because of missing her meds while waiting in the ED.   -Currently controlled on amlodipine 10 mg daily, Imdur 60 mg daily, labetalol 200 mg twice daily.  Discharged with the same regimen.  History of CAD, PAD, HLD Hyperlipidemia -Home meds include Aspirin 81 mg daily, Lipitor 80 mg daily, Zetia 10 mg daily, -Patient states he was taken off Plavix recently after recurrent GI bleeding.  Currently aspirin on hold as well  because of acute GI bleeding.  Continue statin. 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 -Discussed with patient's cardiologist Dr. Einar Gip.  Recommended to continue conservative management at this time.  Would not resume antiplatelets while bleeding.  Patient to follow-up with him as an outpatient.   COPD Chronic daily smoker -Continue bronchodilators -Smokes half a pack per day.  Counseled to quit smoking.  Nicotine patch ordered.   Anxiety/depression -Continue Xanax 0.5 mg twice daily, Effexor 300 mg daily   Chronic pain -Complains of inability to sleep last night because of back pain.   -Chronically on Percocet 5 mg every 6 hours as needed.  Continue same.    Allergies as of 06/13/2021       Reactions   Doxycycline Anaphylaxis, Hives   Hydralazine Rash   Hydrocodone-acetaminophen Nausea And Vomiting   Iohexol Itching   Pt has itching nose after iv contrast injection        Medication List     STOP taking these medications    acetaminophen 500 MG tablet Commonly known as: TYLENOL   aspirin 81 MG EC tablet       TAKE these medications    albuterol 108 (90 Base) MCG/ACT inhaler Commonly known as: VENTOLIN HFA Inhale 2 puffs into the lungs every 6 (six) hours as needed for wheezing or shortness of breath (asthma).   ALPRAZolam 1 MG tablet Commonly known as: XANAX Take 0.5-1 tablets by mouth 2 (two) times daily.   amLODipine 10 MG tablet Commonly known as: NORVASC TAKE 1 TABLET BY MOUTH ONCE DAILY What changed: when to take this   atorvastatin 80 MG tablet Commonly known as: LIPITOR Take 80 mg by mouth every morning.   azelastine 0.1 % nasal spray Commonly known as: ASTELIN Place 1 spray into both nostrils daily as needed (seasonal allergies).   ezetimibe 10 MG tablet Commonly known as: ZETIA TAKE 1 TABLET BY MOUTH ONCE A DAY What  changed: when to take this   feeding supplement Liqd Take 237 mLs by mouth daily.   isosorbide mononitrate 60 MG 24 hr tablet Commonly known as: IMDUR Take 1 tablet (60 mg total) by mouth daily. Start taking on: June 14, 2021 What changed: how much to take   labetalol 200 MG tablet Commonly known as: NORMODYNE Take 1 tablet (200 mg total) by mouth 2 (two) times daily. What changed:  medication strength how much to take   linaclotide 72 MCG capsule Commonly known as: Linzess Take 1 capsule (72 mcg total) by mouth daily before breakfast.   mometasone-formoterol 100-5 MCG/ACT Aero Commonly known as: DULERA Take 2 puffs first thing in am and then another 2 puffs about 12 hours later. What changed:  how much to take how to take this when to take this additional instructions   nitroGLYCERIN 0.4 MG SL tablet Commonly known as: NITROSTAT Place 1 tablet (0.4 mg total) under the tongue every 5 (five) minutes as needed for chest pain.   oxyCODONE-acetaminophen 5-325 MG tablet Commonly known as: PERCOCET/ROXICET Take 1 tablet by mouth every 6 (six) hours as needed for up to 5 days for moderate pain.   pantoprazole 40 MG tablet Commonly known as: PROTONIX TAKE 1 TABLET BY MOUTH TWICE (2) DAILY What changed: See the new instructions.   promethazine 25 MG tablet Commonly known as: PHENERGAN Take 25 mg by mouth 2 (two) times daily as needed for nausea or vomiting.   venlafaxine XR 150 MG 24 hr capsule Commonly known as: Effexor XR Take 2 capsules (300 mg total) by mouth daily with breakfast.        Discharge Instructions:  Diet Recommendation:  Discharge Diet Orders (From admission, onward)     Start     Ordered   06/13/21 0000  Diet - low sodium heart healthy        06/13/21 1344              Follow with Primary MD Bernerd Limbo, MD in 7 days   Get CBC/BMP checked in next visit within 1 week by PCP or SNF MD ( we routinely change or add medications that  can affect your baseline labs and fluid status, therefore we recommend that you get the mentioned basic workup next visit with your PCP, your PCP may decide not to get them or add new tests based on their clinical decision)  On your next visit with your PCP, please Get Medicines reviewed and adjusted.  Please request your PCP  to go over all Hospital Tests and Procedure/Radiological results at the follow up, please get all Hospital records sent to your Prim MD by signing hospital release before you go home.  Activity: As tolerated with Full fall precautions use walker/cane & assistance as needed  For Heart failure patients - Check your Weight same time everyday, if you gain over 2 pounds, or you develop in leg swelling, experience more shortness of breath or chest pain, call your Primary MD immediately. Follow Cardiac Low Salt Diet and 1.5 lit/day fluid restriction.  If you have smoked or chewed Tobacco in the last 2 yrs please stop smoking, stop any regular Alcohol  and or any Recreational drug use.  If you experience worsening of your admission symptoms, develop shortness of breath, life threatening emergency, suicidal or homicidal thoughts you must seek medical attention immediately by calling 911 or calling your MD immediately  if symptoms less severe.  You Must read complete instructions/literature along with all the possible adverse reactions/side effects for all the Medicines you take and that have been prescribed to you. Take any new Medicines after you have completely understood and accpet all the possible adverse reactions/side effects.   Do not drive, operate heavy machinery, perform activities at heights, swimming or participation in water activities or provide baby sitting services if your were admitted for syncope or siezures until you have seen by Primary MD or a Neurologist and advised to do so again.  Do not drive when taking Pain medications.  Do not take more than prescribed  Pain, Sleep and Anxiety Medications  Wear Seat belts while driving.   Please note You were cared for by a hospitalist during your hospital stay. If you have any questions about  your discharge medications or the care you received while you were in the hospital after you are discharged, you can call the unit and asked to speak with the hospitalist on call if the hospitalist that took care of you is not available. Once you are discharged, your primary care physician will handle any further medical issues. Please note that NO REFILLS for any discharge medications will be authorized once you are discharged, as it is imperative that you return to your primary care physician (or establish a relationship with a primary care physician if you do not have one) for your aftercare needs so that they can reassess your need for medications and monitor your lab values.    Follow ups:    Follow-up Information     Bernerd Limbo, MD Follow up.   Specialty: Family Medicine Contact information: [address] [place] [address] [phone]                 Wound care:     Discharge Exam:   Vitals:   06/13/21 0800 06/13/21 0900 06/13/21 1000 06/13/21 1100  BP: (!) 178/86 (!) 186/84  (!) 157/71  Pulse: 67 71 62 (!) 57  Resp: 17 18 (!) 21 17  Temp: 97.8 F (36.6 C)     TempSrc: Oral     SpO2: 95% (!) 86% 91% 93%  Weight:      Height:        Body mass index is 16.13 kg/m.  General exam: Pleasant, middle-aged Caucasian female.  Not in physical distress Skin: No rashes, lesions or ulcers. HEENT: Atraumatic, normocephalic, no obvious bleeding Lungs: Clear to auscultation bilaterally CVS: Regular rate and rhythm, no murmur GI/Abd soft, nontender, nondistended, bowel sound present CNS: Alert, awake, oriented x3 Psychiatry: Mood appropriate Extremities: No pedal edema, no calf tenderness  Time coordinating discharge: 35 minutes   The results of significant  diagnostics from this hospitalization (including imaging, microbiology, ancillary and laboratory) are listed below for reference.    Procedures and Diagnostic Studies:   CT ABDOMEN PELVIS WO CONTRAST  Result Date: 06/10/2021 CLINICAL DATA:  Gastrointestinal bleeding, right upper quadrant abdominal pain EXAM: CT ABDOMEN AND PELVIS WITHOUT CONTRAST TECHNIQUE: Multidetector CT imaging of the abdomen and pelvis was performed following the standard protocol without IV contrast. COMPARISON:  05/09/2021 FINDINGS: Lower chest: No acute pleural or parenchymal lung disease. Hepatobiliary: Unenhanced imaging of the liver and gallbladder demonstrates no focal abnormalities. No biliary duct dilation. Pancreas: Unremarkable. No pancreatic ductal dilatation or surrounding inflammatory changes. Spleen: Normal in size without focal abnormality. Adrenals/Urinary Tract: Stable left renal atrophy. No urinary tract calculi or obstructive uropathy within either kidney. The adrenals and bladder are unremarkable. Stomach/Bowel: No bowel obstruction or ileus. No bowel wall thickening or inflammatory change. Please note that the evaluation for gastrointestinal bleeding is limited without intravenous contrast. Vascular/Lymphatic: Extensive atherosclerosis of the aorta and its branches again noted. Bilateral iliac artery and renal artery stents are again seen. Evaluation of the vascular lumen is limited without IV contrast. No pathologic adenopathy within the abdomen or pelvis. Reproductive: Status post hysterectomy. No adnexal masses. Other: No free fluid or free gas.  No abdominal wall hernia. Musculoskeletal: No acute or destructive bony lesions. Reconstructed images demonstrate no additional findings. IMPRESSION: 1. No acute intra-abdominal or intrapelvic process on this examination limited by the lack of intravenous and oral contrast. 2. Stable left renal atrophy. 3.  Aortic Atherosclerosis (ICD10-I70.0). Electronically Signed   By:  Randa Ngo  M.D.   On: 06/10/2021 17:30     Labs:   Basic Metabolic Panel: Recent Labs  Lab 06/10/21 1608 06/11/21 0301 06/12/21 0252 06/12/21 0806 06/13/21 0241  NA 133* 135 133* 135 135  K 3.1* 3.9 5.6* 4.9 4.6  CL 98 104 107 110 108  CO2 22 21* 20* 20* 19*  GLUCOSE 117* 84 90 87 89  BUN 45* 41* 37* 36* 33*  CREATININE 4.15* 3.87* 3.44* 3.58* 3.49*  CALCIUM 8.2* 8.2* 8.7* 8.3* 8.4*   GFR Estimated Creatinine Clearance: 11.1 mL/min (A) (by C-G formula based on SCr of 3.49 mg/dL (H)). Liver Function Tests: Recent Labs  Lab 06/10/21 1341 06/10/21 1608  AST 21 21  ALT 15 14  ALKPHOS 126 136*  BILITOT 0.5 0.5  PROT 5.7* 6.3*  ALBUMIN 2.6* 2.9*   Recent Labs  Lab 06/10/21 1608  LIPASE 40   No results for input(s): AMMONIA in the last 168 hours. Coagulation profile Recent Labs  Lab 06/10/21 1341  INR 0.9    CBC: Recent Labs  Lab 06/10/21 1341 06/10/21 1608 06/11/21 0301 06/11/21 1233 06/12/21 0252 06/13/21 0241  WBC 7.0 7.3 4.9  --  5.9 5.6  NEUTROABS  --  4.7  --   --   --   --   HGB 7.4* 7.5* 6.3* 10.1* 10.8* 9.8*  HCT 22.9* 22.5* 18.6* 29.7* 32.2* 30.0*  MCV 92.0 92.6 90.7  --  87.7 90.1  PLT 347 337 270  --  277 254   Cardiac Enzymes: No results for input(s): CKTOTAL, CKMB, CKMBINDEX, TROPONINI in the last 168 hours. BNP: Invalid input(s): POCBNP CBG: No results for input(s): GLUCAP in the last 168 hours. D-Dimer No results for input(s): DDIMER in the last 72 hours. Hgb A1c No results for input(s): HGBA1C in the last 72 hours. Lipid Profile No results for input(s): CHOL, HDL, LDLCALC, TRIG, CHOLHDL, LDLDIRECT in the last 72 hours. Thyroid function studies No results for input(s): TSH, T4TOTAL, T3FREE, THYROIDAB in the last 72 hours.  Invalid input(s): FREET3 Anemia work up No results for input(s): VITAMINB12, FOLATE, FERRITIN, TIBC, IRON, RETICCTPCT in the last 72 hours. Microbiology Recent Results (from the past 240 hour(s))   Resp Panel by RT-PCR (Flu A&B, Covid) Nasopharyngeal Swab     Status: None   Collection Time: 06/10/21  6:25 PM   Specimen: Nasopharyngeal Swab; Nasopharyngeal(NP) swabs in vial transport medium  Result Value Ref Range Status   SARS Coronavirus 2 by RT PCR NEGATIVE NEGATIVE Final    Comment: (NOTE) SARS-CoV-2 target nucleic acids are NOT DETECTED.  The SARS-CoV-2 RNA is generally detectable in upper respiratory specimens during the acute phase of infection. The lowest concentration of SARS-CoV-2 viral copies this assay can detect is 138 copies/mL. A negative result does not preclude SARS-Cov-2 infection and should not be used as the sole basis for treatment or other patient management decisions. A negative result may occur with  improper specimen collection/handling, submission of specimen other than nasopharyngeal swab, presence of viral mutation(s) within the areas targeted by this assay, and inadequate number of viral copies(<138 copies/mL). A negative result must be combined with clinical observations, patient history, and epidemiological information. The expected result is Negative.  Fact Sheet for Patients:  EntrepreneurPulse.com.au  Fact Sheet for Healthcare Providers:  IncredibleEmployment.be  This test is no t yet approved or cleared by the [place] FDA and  has been authorized for detection and/or diagnosis of SARS-CoV-2 by FDA under an Emergency Use Authorization (EUA). This  EUA will remain  in effect (meaning this test can be used) for the duration of the COVID-19 declaration under Section 564(b)(1) of the Act, 21 U.S.C.section 360bbb-3(b)(1), unless the authorization is terminated  or revoked sooner.       Influenza A by PCR NEGATIVE NEGATIVE Final   Influenza B by PCR NEGATIVE NEGATIVE Final    Comment: (NOTE) The Xpert Xpress SARS-CoV-2/FLU/RSV plus assay is intended as an aid in the diagnosis of influenza from  Nasopharyngeal swab specimens and should not be used as a sole basis for treatment. Nasal washings and aspirates are unacceptable for Xpert Xpress SARS-CoV-2/FLU/RSV testing.  Fact Sheet for Patients: EntrepreneurPulse.com.au  Fact Sheet for Healthcare Providers: IncredibleEmployment.be  This test is not yet approved or cleared by the [place] FDA and has been authorized for detection and/or diagnosis of SARS-CoV-2 by FDA under an Emergency Use Authorization (EUA). This EUA will remain in effect (meaning this test can be used) for the duration of the COVID-19 declaration under Section 564(b)(1) of the Act, 21 U.S.C. section 360bbb-3(b)(1), unless the authorization is terminated or revoked.  Performed at [facility], [place] [address], [place], [place] [address]   MRSA Next Gen by PCR, Nasal     Status: None   Collection Time: 06/10/21  8:45 PM   Specimen: Nasal Mucosa; Nasal Swab  Result Value Ref Range Status   MRSA by PCR Next Gen NOT DETECTED NOT DETECTED Final    Comment: (NOTE) The GeneXpert MRSA Assay (FDA approved for NASAL specimens only), is one component of a comprehensive MRSA colonization surveillance program. It is not intended to diagnose MRSA infection nor to guide or monitor treatment for MRSA infections. Test performance is not FDA approved in patients less than 57 years old. Performed at [facility], [place] [address], [place], [place] [address]      Signed: Marlowe Aschoff Lynnet Hefley  [facility] 06/13/2021, 3:22 PM

## 2021-06-13 NOTE — Plan of Care (Signed)
  Problem: Education: Goal: Knowledge of General Education information will improve Description: Including pain rating scale, medication(s)/side effects and non-pharmacologic comfort measures Outcome: Not Progressing   Problem: Health Behavior/Discharge Planning: Goal: Ability to manage health-related needs will improve Outcome: Not Progressing   Problem: Nutrition: Goal: Adequate nutrition will be maintained Outcome: Progressing   Problem: Coping: Goal: Level of anxiety will decrease Outcome: Not Progressing   Problem: Pain Managment: Goal: General experience of comfort will improve Outcome: Not Progressing

## 2021-06-13 NOTE — Progress Notes (Signed)
Progress Note Hospital Day: 4  Chief Complaint:    gastrointestinal bleeding     ASSESSMENT AND PLAN   Brief History: 60 yo female with multiple medical problems not limited to HTN, hyperlipidemia, COPD and ongoing smoking, CHF, CAD / remote MI, AS, PAD, chronic mesenteric ischemia, CKD, remote colon resection, small bowel AVMs, anxiety , depression.  Hospitalized in early Aug 2022 with chest pain, N/V , abdominal pain and intermittent melena on Plavix Cardiac cath unrevealing, Plavix discontinued. Patient now admitted 9/10 with recurrent GI bleeding ( melena and hematochezia) and abdominal pain   # Recurrent gastrointestinal bleeding ( melena and hematochezia). Small bowel enteroscopy yesterday was unrevealing. Colonoscopy not repeated ( had one in July 2022). Suspect diverticular hemorrhage, ? ischemic colitis (would seem less likely given chronicity of mesenteric ischemic ) --bleeding has resolved. She is tolerating full liquids, wants to advance diet. Will advance to heart healthy diet   # Acute on chronic anemia secondary to CKD 4 and GI blood loss. --Received dose of IV iron.  --Received 2 uPRBC on 9/11. Hgb improved from 6.3 to 9.8 --Recommend oral iron supplementation upon discharge   # PVD.  SMA stent, terminal aortic stent and bilateral common iliac artery stent. She hasn't taken plavix nor ASA in over a month.  --Mesenteric doppler study 06/01/21 remarkable for 70 to 99% stenosis in the superior mesenteric artery stent. 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   Diagnostic studies this admission  CTAP without contrast 06/10/2021: 1. No acute intra-abdominal or intrapelvic process on this examination limited by the lack of intravenous and oral contrast. 2. Stable left renal atrophy. 3.  Aortic Atherosclerosis    06/12/21 Mesenteric doppler study Mesenteric doppler study 06/01/21 remarkable for 70 to 99% stenosis in the superior mesenteric artery stent. 70-99%  stenosis in the native celiac artery     PREVIOUS GI EVALUATIONS:     Enteroscopy August 27, 2020 while inpatient, Dr. Bryan Lemma.  The esophagus and stomach were normal.  Small AVM was found and treated with APC in the duodenum bulb.  A small AVM was found and treated with APC in the proximal jejunum. 08/06/2020 EGD Dr. Lyndel Safe with food residue in the stomach with outlet obstruction, otherwise normal.  No upper GI bleeding.   Colonoscopy August 27, 2020 while inpatient, Dr. Bryan Lemma.  "Fair" prep.  20 mm sessile serrated polyp in the ascending was removed with a hot snare, piecemeal technique.  Repeat colonoscopy in 3 months because the bowel preparation was poor and for surveillance after piecemeal polypectomy.   Colonoscopy 08/07/2020 by Dr. Lyndel Safe; with a single nonbleeding colonic angiodysplastic lesion treated with APC, one 8 mm hyperplastic polyp in the distal sigmoid colon, minimal neosigmoid diverticulosis, patent end-to-end colocolonic anastomosis characterized by healthy-appearing mucosa, nonbleeding internal hemorrhoids.  It was felt the patient would likely need a repeat colonoscopy in 6 months with a 2-day preparation due to poor visualization.    Small bowel capsule endoscopy August 24, 2020 while inpatient showed small AVM in the duodenal bulb, small AVM in the proximal jejunum  04/12/21 Colonoscopy for colon cancer surveillance.  --One 8 mm polyp in the sigmoid colon, removed with a cold snare. Resected and retrieved. - Diverticulosis in the left colon. - The examination was otherwise normal on direct and retroflexion views.  06/12/21 Small bowel enteroscopy  -- Non-obstructing Schatzki ring. - 3 cm hiatal hernia. - No gross lesions in the stomach. - Normal mucosa was found in the entire examined  duodenum. - Normal mucosa was found in the proximal jejunum. Tattooed distal extent. - Follow up doppler studies. - As patient has been off anti-PLT therapy from last month, if  able to remain off of therapy, would be ideal for at least the next 48 hours. If any anti-PLT therapy is required, would initiate Aspirin first rather than Plavix if possible. However, ultimate decision will be based on medicine/cardiology/vascular surgery.   SUBJECTIVE    She feels depressed. Upset about lack of definite cause for gastrointestinal bleeding. No bleeding / BMs today. No abdominal pain at present. Wants solid food   OBJECTIVE      Scheduled inpatient medications:   ALPRAZolam  0.5-1 mg Oral BID   amLODipine  10 mg Oral q morning   atorvastatin  80 mg Oral q morning   Chlorhexidine Gluconate Cloth  6 each Topical Q2000   ezetimibe  10 mg Oral q morning   isosorbide mononitrate  60 mg Oral Daily   labetalol  100 mg Oral BID   mouth rinse  15 mL Mouth Rinse BID   nicotine  21 mg Transdermal Daily   pantoprazole (PROTONIX) IV  40 mg Intravenous Q12H   venlafaxine XR  150 mg Oral Q breakfast   Continuous inpatient infusions:   sodium chloride 75 mL/hr at 06/13/21 0812   PRN inpatient medications: acetaminophen **OR** acetaminophen, albuterol, HYDROmorphone (DILAUDID) injection, labetalol, oxyCODONE-acetaminophen  Vital signs in last 24 hours: Temp:  [97.4 F (36.3 C)-98.1 F (36.7 C)] 97.8 F (36.6 C) (09/13 0800) Pulse Rate:  [57-85] 65 (09/13 0600) Resp:  [11-24] 14 (09/13 0600) BP: (151-214)/(68-93) 179/93 (09/13 0600) SpO2:  [90 %-100 %] 94 % (09/13 0600) Weight:  [40 kg] 40 kg (09/12 1031) Last BM Date: 06/12/21  Intake/Output Summary (Last 24 hours) at 06/13/2021 0904 Last data filed at 06/13/2021 8588 Gross per 24 hour  Intake 2100.84 ml  Output --  Net 2100.84 ml     Physical Exam:  General: Alert thin female in NAD Heart:  Regular rate and rhythm. No lower extremity edema Pulmonary: Normal respiratory effort Abdomen: Soft, mildly distended, nontender. Normal bowel sounds.  Neurologic: Alert and oriented Psych: Pleasant. Cooperative.   Filed  Weights   06/10/21 1712 06/10/21 2100 06/12/21 1031  Weight: 42 kg 40.8 kg 40 kg    Intake/Output from previous day: 09/12 0701 - 09/13 0700 In: 1416.2 [P.O.:720; I.V.:579.2; IV Piggyback:117] Out: -  Intake/Output this shift: Total I/O In: 684.6 [I.V.:684.6] Out: -     Lab Results: Recent Labs    06/11/21 0301 06/11/21 1233 06/12/21 0252 06/13/21 0241  WBC 4.9  --  5.9 5.6  HGB 6.3* 10.1* 10.8* 9.8*  HCT 18.6* 29.7* 32.2* 30.0*  PLT 270  --  277 254   BMET Recent Labs    06/12/21 0252 06/12/21 0806 06/13/21 0241  NA 133* 135 135  K 5.6* 4.9 4.6  CL 107 110 108  CO2 20* 20* 19*  GLUCOSE 90 87 89  BUN 37* 36* 33*  CREATININE 3.44* 3.58* 3.49*  CALCIUM 8.7* 8.3* 8.4*   LFT Recent Labs    06/10/21 1608  PROT 6.3*  ALBUMIN 2.9*  AST 21  ALT 14  ALKPHOS 136*  BILITOT 0.5   PT/INR Recent Labs    06/10/21 1341  LABPROT 12.0  INR 0.9   Hepatitis Panel No results for input(s): HEPBSAG, HCVAB, HEPAIGM, HEPBIGM in the last 72 hours.  VAS [place] MESENTERIC  Result Date: 06/12/2021 ABDOMINAL VISCERAL Patient  Name:  [name]  Date of Exam:   06/12/2021 Medical Rec #: [id number]         Accession #:    [id number] Date of Birth: 1961-02-18         Patient Gender: F Patient Age:   35 years Exam Location:  [facility] Procedure:      VAS [place] MESENTERIC Referring Phys: [id number] Patrecia Pour [facility] -------------------------------------------------------------------------------- Indications: Acute abdominal pain, history of SMA stent, bilateral renal artery              stents, and bilateral common iliac artery stent. High Risk Factors: Hypertension, hyperlipidemia. Other Factors: Chronic mesenteric ischemia. Comparison Study: 05-09-2021 Renal artery bilateral showed patent SMA with no                   evidence of stenosis.                    04-11-2021 Mesenteric complete showed patent SMA with no                   evidence of stenosis. Performing  Technologist: Darlin Coco RDMS, RVT  Examination Guidelines: A complete evaluation includes B-mode imaging, spectral Doppler, color Doppler, and power Doppler as needed of all accessible portions of each vessel. Bilateral testing is considered an integral part of a complete examination. Limited examinations for reoccurring indications may be performed as noted.  Duplex Findings: +----------------------+--------+--------+------+---------+ Mesenteric            PSV cm/sEDV cm/sPlaqueComments  +----------------------+--------+--------+------+---------+ Aorta Prox               90                           +----------------------+--------+--------+------+---------+ Aorta Mid               167                           +----------------------+--------+--------+------+---------+ Aorta Distal            178                           +----------------------+--------+--------+------+---------+ Celiac Artery Proximal  272                           +----------------------+--------+--------+------+---------+ SMA Proximal            411                           +----------------------+--------+--------+------+---------+ SMA Mid                 440                 Turbulent +----------------------+--------+--------+------+---------+ SMA Distal              194                           +----------------------+--------+--------+------+---------+ IMA                     224                           +----------------------+--------+--------+------+---------+  Summary: Mesenteric: 70 to 99% stenosis in the superior mesenteric artery stent. 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  *See table(s) above for measurements and observations.  Diagnosing physician: Servando Snare MD  Electronically signed by Servando Snare MD on 06/12/2021 at 11:06:58 PM.    Final         Active Problems:   Hypertensive urgency     LOS: 3 days   Tye Savoy ,NP 06/13/2021, 9:04  AM

## 2021-06-13 NOTE — Progress Notes (Signed)
Initial Nutrition Assessment  DOCUMENTATION CODES:   Severe malnutrition in context of chronic illness, Underweight  INTERVENTION:  - will order Boost Breeze BID, each supplement provides 250 kcal and 9 grams of protein. - will order Ensure Plus once/day, each supplement provides 350 kcal and 13 grams of protein. - will order 1 tablet multivitamin with minerals/day.    NUTRITION DIAGNOSIS:   Severe Malnutrition related to chronic illness as evidenced by severe fat depletion, severe muscle depletion.   GOAL:   Patient will meet greater than or equal to 90% of their needs  MONITOR:   PO intake, Supplement acceptance, Labs, Weight trends  REASON FOR ASSESSMENT:   Malnutrition Screening Tool  ASSESSMENT:   60 y.o. female with medical history of HTN, HLD, CHF, CAD/MI in 2015, aortic stenosis s/p stent, COPD, half a pack per day smoking, PAD s/p SMA stent, bilateral common iliac artery stent on aspirin, chronic mesenteric ischemia, hx of GI bleeding, s/p colon resection in 2004 bowel obstruction. She presented to the ED on 9/10 due to melena and hematochezia x4 days with associate abdominal pain, sob, palpitations, and lightheadedness.  Patient laying in bed with no family or visitors present. Patient falls asleep often during RD visit and ultimately states she needs to use the bedside commode.   For breakfast she had potato soup, pudding, and jello. She denies any abdominal pain, cramping, or nausea during or after the meal.   Lunch of solid food ordered but has not yet arrived. Able to talk with RN prior to visit to patient's room and she reported that if patient tolerates lunch and is able to ambulate without O2, then she will be able to discharge later today. Patient is aware of this plan.   Patient states that for several days PTA she was having bloody BMs every time she ate. Prior to that, she was not limiting or avoiding any foods or beverages for any reason.   Unable to  obtain any other nutrition-related information at this time.   Weight yesterday was 88 lb and weight on 01/10/21 was 92 lb. This indicates 4 lb weight loss (4.3% body weight) in the past 5 months; not significant for time frame.    Labs reviewed; BUN: 33 mg/dl, creatinine: 3.49 mg/dl, Ca: 8.4 mg/dl, GFR: 15 ml/min.  Medications reviewed; 40 mg IV protonix BID. IVF; NS @ 75 ml/hr.     NUTRITION - FOCUSED PHYSICAL EXAM:  Flowsheet Row Most Recent Value  Orbital Region Moderate depletion  Upper Arm Region Severe depletion  Thoracic and Lumbar Region Severe depletion  Buccal Region Severe depletion  Temple Region Moderate depletion  Clavicle Bone Region Severe depletion  Clavicle and Acromion Bone Region Severe depletion  Scapular Bone Region Moderate depletion  Dorsal Hand Moderate depletion  Patellar Region Severe depletion  Anterior Thigh Region Severe depletion  Posterior Calf Region Severe depletion  Edema (RD Assessment) None  Hair Reviewed  Eyes Reviewed  Mouth Reviewed  [dentures]  Skin Reviewed  Nails Reviewed       Diet Order:   Diet Order             Diet Heart Room service appropriate? Yes; Fluid consistency: Thin  Diet effective now                   EDUCATION NEEDS:   No education needs have been identified at this time  Skin:  Skin Assessment: Reviewed RN Assessment  Last BM:  9/12 (type 1 x1)  Height:   Ht Readings from Last 1 Encounters:  06/12/21 5\' 2"  (1.575 m)    Weight:   Wt Readings from Last 1 Encounters:  06/12/21 40 kg     Estimated Nutritional Needs:  Kcal:  1400-1600 kcal Protein:  70-80 grams Fluid:  >/= 1.7 L/day     Jarome Matin, MS, RD, LDN, CNSC Inpatient Clinical Dietitian RD pager # available in [facility]  After hours/weekend pager # available in [facility]

## 2021-06-14 DIAGNOSIS — R195 Other fecal abnormalities: Secondary | ICD-10-CM

## 2021-06-14 DIAGNOSIS — K5790 Diverticulosis of intestine, part unspecified, without perforation or abscess without bleeding: Secondary | ICD-10-CM

## 2021-06-14 DIAGNOSIS — T457X5S Adverse effect of anticoagulant antagonists, vitamin K and other coagulants, sequela: Secondary | ICD-10-CM

## 2021-07-25 ENCOUNTER — Encounter (HOSPITAL_COMMUNITY): Payer: Self-pay | Admitting: Emergency Medicine

## 2021-07-25 ENCOUNTER — Emergency Department (HOSPITAL_COMMUNITY): Payer: Medicare Other

## 2021-07-25 ENCOUNTER — Emergency Department (HOSPITAL_COMMUNITY)
Admission: EM | Admit: 2021-07-25 | Discharge: 2021-07-25 | Disposition: A | Discharge status: Home / Self Care | Payer: Medicare Other | Attending: Emergency Medicine | Admitting: Emergency Medicine

## 2021-07-25 DIAGNOSIS — Z85828 Personal history of other malignant neoplasm of skin: Secondary | ICD-10-CM | POA: Insufficient documentation

## 2021-07-25 DIAGNOSIS — E876 Hypokalemia: Secondary | ICD-10-CM | POA: Diagnosis not present

## 2021-07-25 DIAGNOSIS — R079 Chest pain, unspecified: Secondary | ICD-10-CM | POA: Insufficient documentation

## 2021-07-25 DIAGNOSIS — J449 Chronic obstructive pulmonary disease, unspecified: Secondary | ICD-10-CM | POA: Diagnosis not present

## 2021-07-25 DIAGNOSIS — I2510 Atherosclerotic heart disease of native coronary artery without angina pectoris: Secondary | ICD-10-CM | POA: Diagnosis not present

## 2021-07-25 DIAGNOSIS — N185 Chronic kidney disease, stage 5: Secondary | ICD-10-CM | POA: Diagnosis not present

## 2021-07-25 DIAGNOSIS — R0602 Shortness of breath: Secondary | ICD-10-CM | POA: Diagnosis not present

## 2021-07-25 DIAGNOSIS — Z8541 Personal history of malignant neoplasm of cervix uteri: Secondary | ICD-10-CM | POA: Insufficient documentation

## 2021-07-25 DIAGNOSIS — R1010 Upper abdominal pain, unspecified: Secondary | ICD-10-CM | POA: Insufficient documentation

## 2021-07-25 DIAGNOSIS — I132 Hypertensive heart and chronic kidney disease with heart failure and with stage 5 chronic kidney disease, or end stage renal disease: Secondary | ICD-10-CM | POA: Diagnosis not present

## 2021-07-25 DIAGNOSIS — R112 Nausea with vomiting, unspecified: Secondary | ICD-10-CM | POA: Insufficient documentation

## 2021-07-25 DIAGNOSIS — R509 Fever, unspecified: Secondary | ICD-10-CM | POA: Diagnosis not present

## 2021-07-25 DIAGNOSIS — I509 Heart failure, unspecified: Secondary | ICD-10-CM | POA: Insufficient documentation

## 2021-07-25 DIAGNOSIS — F17210 Nicotine dependence, cigarettes, uncomplicated: Secondary | ICD-10-CM | POA: Diagnosis not present

## 2021-07-25 DIAGNOSIS — Z79899 Other long term (current) drug therapy: Secondary | ICD-10-CM | POA: Diagnosis not present

## 2021-07-25 DIAGNOSIS — J45909 Unspecified asthma, uncomplicated: Secondary | ICD-10-CM | POA: Diagnosis not present

## 2021-07-25 DIAGNOSIS — R1013 Epigastric pain: Secondary | ICD-10-CM | POA: Insufficient documentation

## 2021-07-25 DIAGNOSIS — Z955 Presence of coronary angioplasty implant and graft: Secondary | ICD-10-CM | POA: Insufficient documentation

## 2021-07-25 DIAGNOSIS — K219 Gastro-esophageal reflux disease without esophagitis: Secondary | ICD-10-CM | POA: Insufficient documentation

## 2021-07-25 DIAGNOSIS — Z85038 Personal history of other malignant neoplasm of large intestine: Secondary | ICD-10-CM | POA: Insufficient documentation

## 2021-07-25 DIAGNOSIS — R1084 Generalized abdominal pain: Secondary | ICD-10-CM

## 2021-07-25 LAB — CBC
HCT: 29.9 % — ABNORMAL LOW (ref 36.0–46.0)
Hemoglobin: 10.2 g/dL — ABNORMAL LOW (ref 12.0–15.0)
MCH: 29.9 pg (ref 26.0–34.0)
MCHC: 34.1 g/dL (ref 30.0–36.0)
MCV: 87.7 fL (ref 80.0–100.0)
Platelets: 405 10*3/uL — ABNORMAL HIGH (ref 150–400)
RBC: 3.41 MIL/uL — ABNORMAL LOW (ref 3.87–5.11)
RDW: 16.6 % — ABNORMAL HIGH (ref 11.5–15.5)
WBC: 8.7 10*3/uL (ref 4.0–10.5)
nRBC: 0 % (ref 0.0–0.2)

## 2021-07-25 LAB — COMPREHENSIVE METABOLIC PANEL
ALT: 11 U/L (ref 0–44)
AST: 15 U/L (ref 15–41)
Albumin: 2.6 g/dL — ABNORMAL LOW (ref 3.5–5.0)
Alkaline Phosphatase: 175 U/L — ABNORMAL HIGH (ref 38–126)
Anion gap: 10 (ref 5–15)
BUN: 35 mg/dL — ABNORMAL HIGH (ref 6–20)
CO2: 21 mmol/L — ABNORMAL LOW (ref 22–32)
Calcium: 8 mg/dL — ABNORMAL LOW (ref 8.9–10.3)
Chloride: 103 mmol/L (ref 98–111)
Creatinine, Ser: 5.17 mg/dL — ABNORMAL HIGH (ref 0.44–1.00)
GFR, Estimated: 9 mL/min — ABNORMAL LOW (ref >60)
Glucose, Bld: 98 mg/dL (ref 70–99)
Potassium: 2.9 mmol/L — ABNORMAL LOW (ref 3.5–5.1)
Sodium: 134 mmol/L — ABNORMAL LOW (ref 135–145)
Total Bilirubin: 0.3 mg/dL (ref 0.3–1.2)
Total Protein: 6.6 g/dL (ref 6.5–8.1)

## 2021-07-25 LAB — URINALYSIS, ROUTINE W REFLEX MICROSCOPIC
Bilirubin Urine: NEGATIVE
Glucose, UA: 50 mg/dL — AB
Hgb urine dipstick: NEGATIVE
Ketones, ur: NEGATIVE mg/dL
Leukocytes,Ua: NEGATIVE
Nitrite: NEGATIVE
Protein, ur: 100 mg/dL — AB
Specific Gravity, Urine: 1.012 (ref 1.005–1.030)
pH: 6 (ref 5.0–8.0)

## 2021-07-25 LAB — TROPONIN I (HIGH SENSITIVITY)
Troponin I (High Sensitivity): 18 ng/L — ABNORMAL HIGH (ref <18)
Troponin I (High Sensitivity): 20 ng/L — ABNORMAL HIGH (ref <18)

## 2021-07-25 LAB — LIPASE, BLOOD: Lipase: 31 U/L (ref 11–51)

## 2021-07-25 MED ORDER — ONDANSETRON HCL 4 MG/2ML IJ SOLN
4.0000 mg | Freq: Once | INTRAMUSCULAR | Status: AC
  Administered 2021-07-25: 4 mg via INTRAVENOUS
  Filled 2021-07-25: qty 2

## 2021-07-25 MED ORDER — ONDANSETRON 4 MG PO TBDP: 4.0000 mg | ORAL_TABLET | Freq: Three times a day (TID) | ORAL | 0 refills | Status: AC | PRN

## 2021-07-25 MED ORDER — OXYCODONE-ACETAMINOPHEN 5-325 MG PO TABS: 1.0000 | ORAL_TABLET | Freq: Four times a day (QID) | ORAL | 0 refills | Status: DC | PRN

## 2021-07-25 MED ORDER — HYDROMORPHONE HCL 1 MG/ML IJ SOLN
1.0000 mg | Freq: Once | INTRAMUSCULAR | Status: AC
  Administered 2021-07-25: 1 mg via INTRAVENOUS
  Filled 2021-07-25: qty 1

## 2021-07-25 MED ORDER — SODIUM CHLORIDE 0.9 % IV BOLUS
500.0000 mL | Freq: Once | INTRAVENOUS | Status: AC
  Administered 2021-07-25: 500 mL via INTRAVENOUS

## 2021-07-25 MED ORDER — POTASSIUM CHLORIDE CRYS ER 20 MEQ PO TBCR
20.0000 meq | EXTENDED_RELEASE_TABLET | Freq: Once | ORAL | Status: AC
  Administered 2021-07-25: 20 meq via ORAL
  Filled 2021-07-25: qty 1

## 2021-07-25 NOTE — Discharge Instructions (Addendum)
You were seen in the emergency department for evaluation of worsening of your abdominal pain along with pain in your chest.  You had lab work done that showed your renal function to be slowly worsening.  Your symptoms improved with some fluids nausea and pain medication.  We are providing you with a small prescription for some pain medicine.  Please contact your GI team for close follow-up.  Return to the emergency department if any high fever or worsening symptoms.

## 2021-07-25 NOTE — ED Provider Notes (Signed)
Emergency Medicine Provider Triage Evaluation Note  [name] , a 60 y.o. female  was evaluated in triage.  Pt complains of diffuse abdominal pain that has been chronic and ongoing for some time.  Has known chronic mesenteric ischemia and numerous bowel resections.  She also complains of odynophagia.  Also has a known hiatal hernia.  She reports associated urinary complaints, subjective fever, chills, nausea, and vomiting.  Review of Systems  Positive: Dysuria, urinary frequency, urinary urgency Negative:   Physical Exam  BP 120/69   Pulse 82   Temp 98 F (36.7 C) (Oral)   Resp 18   SpO2 92%  Gen:   Awake, no distress   Resp:  Normal effort  MSK:   Moves extremities without difficulty  Other:  Diffuse abdominal tenderness   Medical Decision Making  Medically screening exam initiated at 11:40 AM.  Appropriate orders placed.  [name] was informed that the remainder of the evaluation will be completed by another provider, this initial triage assessment does not replace that evaluation, and the importance of remaining in the ED until their evaluation is complete.     [name] [place], PA-C 07/25/21 1141    Cheyenne Gambler, MD 07/25/21 1200

## 2021-07-25 NOTE — ED Triage Notes (Addendum)
Per EMS-states she had colon surgery in September-ran out of pain meds 2 weeks ago-states she is still having pain-states her surgeon told her to come to ED-200 mcg of Fentanyl and 500 cc of NS given in route

## 2021-07-25 NOTE — ED Provider Notes (Signed)
[facility] DEPT Provider Note   CSN: [id number] Arrival date & time: 07/25/21  1055     History Chief Complaint  Patient presents with   Abdominal Pain    [name] is a 60 y.o. female.  She has a history of CKD, renal stents, hiatal hernia, lower GI bleed, chronic mesenteric ischemia. She was recently admitted and September for lower GI bleeding and had colonoscopy.  She was sent home with Percocet.  She ran out of these about 2 weeks ago and is complaining of worsening upper abdominal pain radiating into her chest causes her to be short of breath.  Also having feeling of incomplete emptying with bowel movements and what sounds like rectal prolapse.  She rates her pain as severe.  She has known worsening renal function and is to start dialysis in the next few weeks.  She supposed to see Dr. Luan Pulling vascular for access.  She thinks she may have had a fever and she has had chills intermittently.  She denies any rectal bleeding.  The history is provided by the patient.  Abdominal Pain Pain location:  Epigastric Pain quality: sharp and stabbing   Pain radiates to:  Chest Pain severity:  Moderate Onset quality:  Gradual Duration:  2 weeks Timing:  Constant Progression:  Worsening Chronicity:  Recurrent Context: not trauma   Relieved by:  Nothing Worsened by:  Palpation and movement Ineffective treatments:  None tried Associated symptoms: chest pain, chills, constipation, fever, nausea, shortness of breath and vomiting   Associated symptoms: no dysuria and no sore throat       Past Medical History:  Diagnosis Date   Anemia 2008   Anginal pain (HCC)    Anxiety    Aortic stenosis    Arthritis    "all my joints ache" (09/07/2014)   Asthma    Bipolar 1 disorder ([place])    Blood transfusion without reported diagnosis    Bradycardia    Bruit    Carpal tunnel syndrome    Cataract    forming   Cervical cancer ([place]) 1985   CHF (congestive  heart failure) ([place])    Chronic kidney disease (CKD), stage V (HCC)    COPD (chronic obstructive pulmonary disease) ([place]) 2000   Coronary artery disease    Daily headache    Depression 2000   Diverticulitis 2008   Emphysema of lung (HCC)    GERD (gastroesophageal reflux disease)    Glaucoma    Heart murmur    History of colon polyps 2009   HLD (hyperlipidemia) 2013   Hypertension 2013   Hypovitaminosis D    IBS (irritable bowel syndrome) 2008   Myocardial infarction [facility])    2015   PAD (peripheral artery disease) (HCC)    Pancreatitis 10/2011   Pneumonia 07/2014   RLS (restless legs syndrome)    Schizophrenia (HCC)    Shortness of breath    Skin cancer    Small bowel obstruction ([place]) 2008   Thyroid disease     Patient Active Problem List   Diagnosis Date Noted   Dark stools    Diverticulosis    Adverse reaction to antiplatelet agent, sequela    AKI (acute kidney injury) ([place]) 05/11/2021   Chest pain, rule out acute myocardial infarction 05/09/2021   Abnormal EKG    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[place]) 12/30/2020   Chronic mesenteric ischemia ([place]) 12/28/2020   Polyp of ascending colon    AVM (arteriovenous malformation) of small bowel,  acquired with hemorrhage    GIB (gastrointestinal bleeding) 08/23/2020   Hematochezia    Diverticulosis of colon without hemorrhage    Pain of upper abdomen    Angiodysplasia of colon 08/16/2020   GI bleed 08/06/2020   Acute GI bleeding 08/05/2020   Acute blood loss anemia 08/05/2020   Renal artery stenosis (HCC) 05/10/2020   Abdominal aortic stenosis 11/30/2018   Pure hypercholesterolemia 10/22/2018   Abnormal stress test 03/21/2018   At high risk for injury related to fall 12/26/2017   Renal artery stenosis, native, bilateral ([place]) 10/27/2017   Benign neoplasm of transverse colon    Benign neoplasm of descending colon    Benign neoplasm of sigmoid colon    Abnormality on screening test 06/22/2016   Hypertensive urgency  11/01/2015   Chest pain 11/01/2015   History of partial colectomy 10/19/2014   Renovascular hypertension, malignant 09/07/2014   Anemia, chronic disease 07/30/2014   Heart failure with preserved ejection fraction ([place]) 07/02/2014   COPD GOLD 0 / still smoking 07/02/2014   CKD (chronic kidney disease) stage 4, GFR 15-29 ml/min ([place]) 07/02/2014   Protein-calorie malnutrition, severe ([place]) 07/02/2014   Hyperparathyroidism, 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   Postsurgical menopause 08/14/2013   Vaginal atrophy 08/14/2013   Screening for breast cancer 07/21/2012   Diverticulitis of colon with perforation 06/18/2012   Affective bipolar disorder ([place]) 02/02/2009   ADHESIONS, INTESTINAL W/OBSTRUCTION 08/10/2008   Irritable bowel syndrome 08/10/2008   Abdominal pain, generalized 08/10/2008   FECAL OCCULT BLOOD 08/10/2008   Anemia, iron deficiency 08/10/2008   Anxiety state 08/09/2008   DEPRESSION 08/09/2008   ASTHMA 08/09/2008   ESOPHAGEAL STRICTURE 08/09/2008   GERD 08/09/2008   HIATAL HERNIA 08/09/2008   Diverticulosis of large intestine 08/09/2008   ARTHRITIS 08/09/2008   HEADACHE, CHRONIC 08/09/2008   SKIN CANCER, HX OF 08/09/2008   COLONIC POLYPS, HYPERPLASTIC, HX OF 08/09/2008   Cigarette smoker 05/17/2008   Schizophrenia ([place]) 04/30/2008    Past Surgical History:  Procedure Laterality Date   ABDOMINAL ANGIOGRAM N/A 09/07/2014   Procedure: ABDOMINAL ANGIOGRAM;  Surgeon: Laverda Page, MD;  Location: [facility] CATH LAB;  Service: Cardiovascular;  Laterality: N/A;   ABDOMINAL AORTOGRAM W/LOWER EXTREMITY N/A 12/28/2020   Procedure: ABDOMINAL AORTOGRAM W/LOWER EXTREMITY;  Surgeon: Cherre Robins, MD;  Location: [facility] CV LAB;  Service: Cardiovascular;  Laterality: N/A;   APPENDECTOMY  10/01/1994   BIOPSY  08/06/2020   Procedure: BIOPSY;  Surgeon: Jackquline Denmark, MD;  Location: [facility] ENDOSCOPY;  Service: Endoscopy;;   COLON SURGERY  10/01/2006   6 inches of colon removed due to  obstruction   COLONOSCOPY WITH PROPOFOL N/A 10/25/2016   Procedure: COLONOSCOPY WITH PROPOFOL;  Surgeon: Milus Banister, MD;  Location: [facility] ENDOSCOPY;  Service: Endoscopy;  Laterality: N/A;   COLONOSCOPY WITH PROPOFOL N/A 08/07/2020   Procedure: COLONOSCOPY WITH PROPOFOL;  Surgeon: Jackquline Denmark, MD;  Location: [facility] ENDOSCOPY;  Service: Endoscopy;  Laterality: N/A;   COLONOSCOPY WITH PROPOFOL N/A 08/27/2020   Procedure: COLONOSCOPY WITH PROPOFOL;  Surgeon: Lavena Bullion, DO;  Location: [facility] ENDOSCOPY;  Service: Gastroenterology;  Laterality: N/A;   CORONARY ANGIOPLASTY WITH STENT PLACEMENT  09/07/2014   "2"   ENTEROSCOPY N/A 08/27/2020   Procedure: ENTEROSCOPY;  Surgeon: Lavena Bullion, DO;  Location: [facility] ENDOS[facility];  Service: Gastroenterology;  Laterality: N/A;  Push enteroscopy    ENTEROSCOPY N/A 06/12/2021   Procedure: ENTEROSCOPY;  Surgeon: Rush Landmark Telford Nab., MD;  Location: [facility];  Service: Gastroenterology;  Laterality: N/A;  ESOPHAGOGASTRODUODENOSCOPY (EGD) WITH PROPOFOL N/A 08/06/2020   Procedure: ESOPHAGOGASTRODUODENOSCOPY (EGD) WITH PROPOFOL;  Surgeon: Jackquline Denmark, MD;  [facility];  Service: Endoscopy;  Laterality: N/A;   GIVENS CAPSULE STUDY N/A 08/24/2020   Procedure: GIVENS CAPSULE STUDY;  Surgeon: Lavena Bullion, DO;  [facility]: [facility];  Service: Gastroenterology;  Laterality: N/A;   HOT HEMOSTASIS N/A 08/07/2020   Procedure: HOT HEMOSTASIS (ARGON PLASMA COAGULATION/BICAP);  Surgeon: Jackquline Denmark, MD;  Location: [facility];  Service: Endoscopy;  Laterality: N/A;   HOT HEMOSTASIS N/A 08/27/2020   Procedure: HOT HEMOSTASIS (ARGON PLASMA COAGULATION/BICAP);  Surgeon: Lavena Bullion, DO;  [facility];  Service: Gastroenterology;  Laterality: N/A;   LEFT HEART CATH AND CORONARY ANGIOGRAPHY N/A 03/25/2018   Procedure: LEFT HEART CATH AND CORONARY ANGIOGRAPHY;  Surgeon: Nigel Mormon, MD;  Location: [facility] CV LAB;   Service: Cardiovascular;  Laterality: N/A;   LEFT HEART CATH AND CORONARY ANGIOGRAPHY N/A 05/09/2021   Procedure: LEFT HEART CATH AND CORONARY ANGIOGRAPHY;  Surgeon: Adrian Prows, MD;  Location: [facility] CV LAB;  Service: Cardiovascular;  Laterality: N/A;   LEFT HEART CATHETERIZATION WITH CORONARY ANGIOGRAM N/A 09/07/2014   Procedure: LEFT HEART CATHETERIZATION WITH CORONARY ANGIOGRAM;  Surgeon: Laverda Page, MD;  Location: [facility] CATH LAB;  Service: Cardiovascular;  Laterality: N/A;   LOWER EXTREMITY ANGIOGRAPHY  10/29/2017   Procedure: Lower Extremity Angiography;  Surgeon: Adrian Prows, MD;  Location: [facility] CV LAB;  Service: Cardiovascular;;   PERIPHERAL VASCULAR CATHETERIZATION N/A 11/01/2015   Procedure: Renal Angiography;  Surgeon: Adrian Prows, MD;  Location: [facility] CV LAB;  Service: Cardiovascular;  Laterality: N/A;   PERIPHERAL VASCULAR CATHETERIZATION N/A 04/10/2016   Procedure: Renal Angiography;  Surgeon: Adrian Prows, MD;  Location: [facility] CV LAB;  Service: Cardiovascular;  Laterality: N/A;   PERIPHERAL VASCULAR CATHETERIZATION  04/10/2016   Procedure: Peripheral Vascular Intervention;  Surgeon: Adrian Prows, MD;  Location: [facility] CV LAB;  Service: Cardiovascular;;   PERIPHERAL VASCULAR INTERVENTION  10/29/2017   Procedure: PERIPHERAL VASCULAR INTERVENTION;  Surgeon: Adrian Prows, MD;  Location: [facility] CV LAB;  Service: Cardiovascular;;   PERIPHERAL VASCULAR INTERVENTION Bilateral 12/28/2020   Procedure: PERIPHERAL VASCULAR INTERVENTION;  Surgeon: Cherre Robins, MD;  Location: [facility] CV LAB;  Service: Cardiovascular;  Laterality: Bilateral;   POLYPECTOMY  08/07/2020   Procedure: POLYPECTOMY;  Surgeon: Jackquline Denmark, MD;  [facility];  Service: Endoscopy;;   POLYPECTOMY  08/27/2020   Procedure: POLYPECTOMY;  Surgeon: Lavena Bullion, DO;  [facility];  Service: Gastroenterology;;   POLYPECTOMY     RENAL ANGIOGRAPHY N/A 10/29/2017    Procedure: RENAL ANGIOGRAPHY;  Surgeon: Adrian Prows, MD;  Location: [facility] CV LAB;  Service: Cardiovascular;  Laterality: N/A;   RENAL ANGIOGRAPHY N/A 05/10/2020   Procedure: RENAL ANGIOGRAPHY;  Surgeon: Nigel Mormon, MD;  Location: [facility] CV LAB;  Service: Cardiovascular;  Laterality: N/A;   RIGHT OOPHORECTOMY Right 10/01/1994   SUBMUCOSAL TATTOO INJECTION  06/12/2021   Procedure: SUBMUCOSAL TATTOO INJECTION;  Surgeon: Irving Copas., MD;  [facility];  Service: Gastroenterology;;   TOTAL ABDOMINAL HYSTERECTOMY  10/02/1995   UPPER GASTROINTESTINAL ENDOSCOPY       OB History     Gravida  2   Para      Term      Preterm      AB  1   Living  1      SAB  IAB      Ectopic  1   Multiple      Live Births  1           Family History  Problem Relation Age of Onset   Other Mother        many bowel obstructions   Heart disease Mother    Colon polyps Mother    Kidney cancer Father    Bone cancer Father    Diabetes Father    Heart disease Father    Heart disease Brother    Colon cancer Paternal Grandfather    Diabetes Daughter    Esophageal cancer Neg Hx    Rectal cancer Neg Hx    Stomach cancer Neg Hx     Social History   Tobacco Use   Smoking status: Some Days    Packs/day: 1.50    Years: 40.00    Pack years: 60.00    Types: Cigarettes   Smokeless tobacco: Never  Vaping Use   Vaping Use: Former   Substances: THC  Substance Use Topics   Alcohol use: No   Drug use: Yes    Frequency: 3.0 times per week    Types: Marijuana    Comment: last use yesterday    Home Medications Prior to Admission medications   Medication Sig Start Date End Date Taking? Authorizing Provider  albuterol (PROVENTIL HFA;VENTOLIN HFA) 108 (90 BASE) MCG/ACT inhaler Inhale 2 puffs into the lungs every 6 (six) hours as needed for wheezing or shortness of breath (asthma).    [provider]  ALPRAZolam Duanne Moron) 1 MG tablet Take  0.5-1 tablets by mouth 2 (two) times daily. 04/15/17   [provider]  amLODipine (NORVASC) 10 MG tablet TAKE 1 TABLET BY MOUTH ONCE DAILY Patient taking differently: Take 10 mg by mouth every morning. 04/13/21   Cantwell, Celeste C, PA-C  atorvastatin (LIPITOR) 80 MG tablet Take 80 mg by mouth every morning.    [provider]  azelastine (ASTELIN) 0.1 % nasal spray Place 1 spray into both nostrils daily as needed (seasonal allergies). 06/24/19   [provider]  ezetimibe (ZETIA) 10 MG tablet TAKE 1 TABLET BY MOUTH ONCE A DAY Patient taking differently: Take 10 mg by mouth every morning. 05/03/21   Cantwell, Celeste C, PA-C  feeding supplement (ENSURE ENLIVE / ENSURE PLUS) LIQD Take 237 mLs by mouth daily. 06/13/21   Terrilee Croak, MD  isosorbide mononitrate (IMDUR) 60 MG 24 hr tablet Take 1 tablet (60 mg total) by mouth daily. 06/14/21 09/12/21  Terrilee Croak, MD  labetalol (NORMODYNE) 200 MG tablet Take 1 tablet (200 mg total) by mouth 2 (two) times daily. 06/13/21 09/11/21  Terrilee Croak, MD  linaclotide Rolan Lipa) 72 MCG capsule Take 1 capsule (72 mcg total) by mouth daily before breakfast. 08/22/20   Lemmon, Lavone Nian, PA  mometasone-formoterol [facility]) 100-5 MCG/ACT AERO Take 2 puffs first thing in am and then another 2 puffs about 12 hours later. Patient taking differently: Inhale 2 puffs into the lungs See admin instructions. Inhale 2 puffs into the lungs every morning, may also inhale 2 puffs in the afternoon as needed for shortness of breath 04/26/17   Tanda Rockers, MD  nitroGLYCERIN (NITROSTAT) 0.4 MG SL tablet Place 1 tablet (0.4 mg total) under the tongue every 5 (five) minutes as needed for chest pain. 10/15/19   Miquel Dunn, NP  pantoprazole (PROTONIX) 40 MG tablet TAKE 1 TABLET BY MOUTH TWICE (2) DAILY Patient taking differently:  Take 40 mg by mouth 2 (two) times daily. 06/08/20   Adrian Prows, MD  promethazine (PHENERGAN) 25 MG tablet Take 25 mg by  mouth 2 (two) times daily as needed for nausea or vomiting. 08/16/20   [provider]  venlafaxine XR (EFFEXOR XR) 150 MG 24 hr capsule Take 2 capsules (300 mg total) by mouth daily with breakfast. 05/12/21   Arrien, Jimmy Picket, MD    Allergies    Doxycycline, Hydralazine, Hydrocodone-acetaminophen, and Iohexol  Review of Systems   Review of Systems  Constitutional:  Positive for chills and fever.  HENT:  Negative for sore throat.   Eyes:  Negative for visual disturbance.  Respiratory:  Positive for shortness of breath.   Cardiovascular:  Positive for chest pain.  Gastrointestinal:  Positive for abdominal pain, constipation, nausea and vomiting.  Genitourinary:  Negative for dysuria.  Musculoskeletal:  Negative for neck pain.  Skin:  Negative for rash.  Neurological:  Negative for headaches.   Physical Exam Updated Vital Signs BP (!) 205/92   Pulse 87   Temp 98.1 F (36.7 C) (Oral)   Resp 17   Ht 5\' 1"  (1.549 m)   Wt 40.4 kg   SpO2 94%   BMI 16.82 kg/m   Physical Exam Vitals and nursing note reviewed.  Constitutional:      General: She is not in acute distress.    Appearance: She is underweight. She is not ill-appearing.  HENT:     Head: Normocephalic and atraumatic.  Eyes:     Conjunctiva/sclera: Conjunctivae normal.  Cardiovascular:     Rate and Rhythm: Normal rate and regular rhythm.     Pulses: Normal pulses.     Heart sounds: No murmur heard. Pulmonary:     Effort: Pulmonary effort is normal. No respiratory distress.     Breath sounds: Wheezing (Few scattered) present.  Abdominal:     Palpations: Abdomen is soft.     Tenderness: There is abdominal tenderness. There is no guarding or rebound.  Musculoskeletal:        General: No deformity or signs of injury. Normal range of motion.     Cervical back: Neck supple.  Skin:    General: Skin is warm and dry.  Neurological:     General: No focal deficit present.     Mental Status: She is alert.      Motor: No weakness.    ED Results / Procedures / Treatments   Labs (all labs ordered are listed, but only abnormal results are displayed) Labs Reviewed  COMPREHENSIVE METABOLIC PANEL - Abnormal; Notable for the following components:      Result Value   Sodium 134 (*)    Potassium 2.9 (*)    CO2 21 (*)    BUN 35 (*)    Creatinine, Ser 5.17 (*)    Calcium 8.0 (*)    Albumin 2.6 (*)    Alkaline Phosphatase 175 (*)    GFR, Estimated 9 (*)    All other components within normal limits  CBC - Abnormal; Notable for the following components:   RBC 3.41 (*)    Hemoglobin 10.2 (*)    HCT 29.9 (*)    RDW 16.6 (*)    Platelets 405 (*)    All other components within normal limits  URINALYSIS, ROUTINE W REFLEX MICROSCOPIC - Abnormal; Notable for the following components:   Glucose, UA 50 (*)    Protein, ur 100 (*)    Bacteria, UA RARE (*)  All other components within normal limits  TROPONIN I (HIGH SENSITIVITY) - Abnormal; Notable for the following components:   Troponin I (High Sensitivity) 18 (*)    All other components within normal limits  TROPONIN I (HIGH SENSITIVITY) - Abnormal; Notable for the following components:   Troponin I (High Sensitivity) 20 (*)    All other components within normal limits  LIPASE, BLOOD    EKG EKG Interpretation  Date/Time:  Tuesday July 25 2021 20:37:10 EDT Ventricular Rate:  83 PR Interval:  111 QRS Duration: 83 QT Interval:  379 QTC Calculation: 446 R Axis:   77 Text Interpretation: Ectopic atrial rhythm Borderline short PR interval Left ventricular hypertrophy Nonspecific T abnormalities, lateral leads No significant change since prior 9/22 Confirmed by Aletta Edouard [phone]) on 07/25/2021 8:40:15 PM  Radiology CT ABDOMEN PELVIS WO CONTRAST  Result Date: 07/25/2021 CLINICAL DATA:  Abdominal pain EXAM: CT ABDOMEN AND PELVIS WITHOUT CONTRAST TECHNIQUE: Multidetector CT imaging of the abdomen and pelvis was performed following the  standard protocol without IV contrast. COMPARISON:  CT abdomen and pelvis 06/10/2021 FINDINGS: Lower chest: There is of compressive atelectasis visualized in the medial aspect of the right middle lobe and lower lobe, new since previous study. Cardiomegaly. Hepatobiliary: No focal liver abnormality is seen. No gallstones, gallbladder wall thickening, or biliary dilatation. Pancreas: Unremarkable. No pancreatic ductal dilatation or surrounding inflammatory changes. Spleen: Normal in size without focal abnormality. Adrenals/Urinary Tract: Adrenal glands are within normal limits. Stable chronic left renal atrophy. No nephrolithiasis or hydronephrosis identified bilaterally. Urinary bladder appears unremarkable. Stomach/Bowel: No bowel obstruction, free air or pneumatosis. Appears to be wall thickening of the descending and sigmoid colon, limited evaluation due to nondistention. Appendix not visualized. Vascular/Lymphatic: Severe calcified atherosclerotic disease. No bulky lymphadenopathy identified. Reproductive: Status post hysterectomy. No adnexal masses. Other: Stable small amount of free fluid in the dependent pelvis. Musculoskeletal: No acute or significant osseous findings. IMPRESSION: 1. Apparent wall thickening of the descending and sigmoid colon although limited evaluation due to nondistention. Correlate for possible colitis. 2. Chronic left renal atrophy. 3. Stable small amount of free fluid in the dependent pelvis. 4. Compressive atelectatic changes in the medial right middle and lower lobe, new since previous study. 5. Severe atherosclerotic disease noted. Electronically Signed   By: Ofilia Neas   On: 07/25/2021 12:34    Procedures Procedures   Medications Ordered in ED Medications  HYDROmorphone (DILAUDID) injection 1 mg (1 mg Intravenous Given 07/25/21 2122)  ondansetron (ZOFRAN) injection 4 mg (4 mg Intravenous Given 07/25/21 2123)  sodium chloride 0.9 % bolus 500 mL (0 mLs Intravenous  Stopped 07/25/21 2226)  potassium chloride SA (KLOR-CON) CR tablet 20 mEq (20 mEq Oral Given 07/25/21 2226)  HYDROmorphone (DILAUDID) injection 1 mg (1 mg Intravenous Given 07/25/21 2226)    ED Course  I have reviewed the triage vital signs and the nursing notes.  Pertinent labs & imaging results that were available during my care of the patient were reviewed by me and considered in my medical decision making (see chart for details).  Clinical Course as of 07/26/21 1101  Tue Jul 25, 2021  2142 Patient's creatinine has been steadily progressing for the last month.  She said she is going to be getting dialysis soon.  Last creatinine I can find in the system is 10/20 and it was 5.23 at [facility]. [MB]  2147 Patient states her pain is better after medication.  IV fluids are infusing.  We will give an oral dose of  some potassium.  Blood pressure is a little bit better after the pain medicine also.  No clear indication for admission at this time.  We will provide her prescription for some pain medicine.  Recommended close follow-up with her GI team and her nephrologist. [MB]  2203 Discussed with Dr. Delila Pereyra GI.  She reviewed the patient's imaging and said that was not an indication for admission at this time.  She can follow-up outpatient in the clinic. [MB]    Clinical Course User Index [MB] Hayden Rasmussen, MD   MDM Rules/Calculators/A&P                          This patient complains of abdominal pain nausea vomiting chest pain shortness of breath incomplete stool emptying possible rectal prolapse worsening renal failure; this involves an extensive number of treatment Options and is a complaint that carries with it a high risk of complications and Morbidity. The differential includes chronic abdominal pain, obstruction, colitis, diverticulitis, reflux, ACS, pneumonia, ischemic bowel  I ordered, reviewed and interpreted labs, which included CBC with normal white count, hemoglobin low stable  from priors, chemistries with low potassium rising BUN and creatinine.  Urinalysis without signs of infection, troponins elevated but flat I ordered medication IV fluids nausea and pain medication with improvement of patient's symptoms I ordered imaging studies which included chest x-ray CT abdomen pelvis and I independently    visualized and interpreted imaging which showed no acute findings. Additional history obtained from patient's son Previous records obtained and reviewed in epic including prior admission last month and nephrology notes from [facility] I consulted Dr. Lorenso Courier GI and discussed lab and imaging findings  Critical Interventions: None  After the interventions stated above, I reevaluated the patient and found patient to be hemodynamically stable.  Hypertensive has not taken her blood pressure medications secondary to her symptoms.  Reviewed work-up with her.  She has known worsening renal failure and is awaiting dialysis.  Potassium repleted.  Pain controlled with medications.  No indication for admission to the hospital.  Recommended close follow-up with her treating providers.  Return instructions discussed  Final Clinical Impression(s) / ED Diagnoses Final diagnoses:  Generalized abdominal pain  Nonspecific chest pain  Hypokalemia    Rx / DC Orders ED Discharge Orders          Ordered    ondansetron (ZOFRAN-ODT) 4 MG disintegrating tablet  Every 8 hours PRN        07/25/21 2149    oxyCODONE-acetaminophen (PERCOCET/ROXICET) 5-325 MG tablet  Every 6 hours PRN        07/25/21 2149             Hayden Rasmussen, MD 07/26/21 1104

## 2021-08-01 ENCOUNTER — Other Ambulatory Visit: Payer: Self-pay | Admitting: *Deleted

## 2021-08-01 ENCOUNTER — Ambulatory Visit (INDEPENDENT_AMBULATORY_CARE_PROVIDER_SITE_OTHER): Payer: Medicare Other | Admitting: Gastroenterology

## 2021-08-01 ENCOUNTER — Encounter: Payer: Self-pay | Admitting: Gastroenterology

## 2021-08-01 VITALS — BP 126/58 | HR 88 | Ht 61.75 in | Wt 94.8 lb

## 2021-08-01 DIAGNOSIS — R109 Unspecified abdominal pain: Secondary | ICD-10-CM

## 2021-08-01 DIAGNOSIS — N184 Chronic kidney disease, stage 4 (severe): Secondary | ICD-10-CM

## 2021-08-01 DIAGNOSIS — K5909 Other constipation: Secondary | ICD-10-CM | POA: Diagnosis not present

## 2021-08-01 DIAGNOSIS — I701 Atherosclerosis of renal artery: Secondary | ICD-10-CM

## 2021-08-01 MED ORDER — CITRUCEL PO POWD: 1.0000 | Freq: Every day | ORAL | Status: DC

## 2021-08-01 NOTE — Progress Notes (Signed)
Colonoscopy November 2021 while inpatient, Dr. Bryan Lemma.  "Fair" prep.  20 mm polyp in [place] ascending was removed with a hot snare, piecemeal technique.  This was a sessile serrated polyp. 08/07/2020 colonoscopy Dr. Lyndel Safe; with a single nonbleeding colonic angiodysplastic lesion treated with APC, 1 8 mm polyp in [place] distal sigmoid colon, minimal neosigmoid diverticulosis, patent end-to-end colocolonic anastomosis characterized by healthy-appearing mucosa, nonbleeding internal hemorrhoids.  It was felt [name] would likely need a repeat colonoscopy in 6 months with a 2-day preparation due to poor visualization.  Pathology showed hyperplastic polyp.   Enteroscopy November 2021 while inpatient, Dr. Bryan Lemma.  [place] esophagus and stomach were normal.  Small AVM was found and treated with APC in [place] duodenum bulb.  A small AVM was found and treated with APC in [place] proximal jejunum. 08/06/2020 EGD Dr. Lyndel Safe with food residue in [place] stomach with outlet obstruction, otherwise normal.  No upper GI bleeding.   Small bowel capsule endoscopy November 2021 while inpatient showed small AVM in [place] duodenal bulb, small AVM in [place] proximal jejunum   CT scan abdomen pelvis without IV contrast August 04, 2020 showed diffuse vascular atherosclerosis, trace left pleural effusion, otherwise normal.  CT scan abdomen pelvis without IV contrast August 24, 2020 showed "fluid-filled colon, consistent with diarrheal illness"  Colonoscopy July 2022 Dr. Ardis Hughs a single 8 mm hyperplastic polyp was removed.  Diverticulosis was noted in [place] left colon.   She was admitted to [facility] hospital in early September with right upper quadrant pain and bloody bowel movements.  Her chronic right upper quadrant pain was felt likely due to her history of mesenteric ischemia.  SMA stenosis, status post SMA stenting.  Enteroscopy September 2022 while hospitalized showed a 3 cm hiatal hernia, nonobstructive Schatzki's ring, [place] examination was  otherwise normal to [place] jejunum.  Since that hospitalization she is undergone another CT scan.  October 2022 CT scan abdomen pelvis without IV contrast.  This suggested "apparent wall thickening of [place] descending and sigmoid colon although limited evaluation due to nondistention.  Correlate for possible colitis.  Chronic renal left atrophy.  Severe atherosclerotic disease was noted.  Blood work October 2022, just last week.  CBC showed hemoglobin 10.2, troponin was 20, lipase was normal, creatinine was 5.2, LFTs were normal except for alk phos of 175.   HPI: This is a pleasant 60 year old woman  She has chronic abdominal discomforts, mesenteric ischemia well-known status post SMA stenting.  Lately her biggest issue is difficulty moving her bowels and feeling like her rectum is falling out.  She is on chronic narcotic pain medicines and she smells of marijuana this morning in [place] office  ROS: complete GI ROS as described in HPI, all other review negative.  She believes she will be starting dialysis next week  She takes Linzess 72 mcg pills 1 pill once daily.  She is not on anything else for her bowel habits.  Constitutional:  No unintentional weight loss   Past Medical History:  Diagnosis Date   Anemia 2008   Anginal pain ([place])    Anxiety    Aortic stenosis    Arthritis    "all my joints ache" (09/07/2014)   Asthma    Bipolar 1 disorder ([place])    Blood transfusion without reported diagnosis    Bradycardia    Bruit    Carpal tunnel syndrome    Cataract    forming   Cervical cancer ([place]) 1985   CHF (congestive heart failure) ([place])  Chronic kidney disease (CKD), stage V (HCC)    COPD (chronic obstructive pulmonary disease) ([place]) 2000   Coronary artery disease    Daily headache    Depression 2000   Diverticulitis 2008   Emphysema of lung (HCC)    GERD (gastroesophageal reflux disease)    Glaucoma    Heart murmur    History of colon polyps 2009   HLD (hyperlipidemia)  2013   Hypertension 2013   Hypovitaminosis D    IBS (irritable bowel syndrome) 2008   Myocardial infarction [facility])    2015   PAD (peripheral artery disease) ([place])    Pancreatitis 10/2011   Pneumonia 07/2014   RLS (restless legs syndrome)    Schizophrenia (HCC)    Shortness of breath    Skin cancer    Small bowel obstruction ([place]) 2008   Thyroid disease     Past Surgical History:  Procedure Laterality Date   ABDOMINAL ANGIOGRAM N/A 09/07/2014   Procedure: ABDOMINAL ANGIOGRAM;  Surgeon: Laverda Page, MD;  Location: [facility] C[facility] LAB;  Service: Cardiovascular;  Laterality: N/A;   ABDOMINAL AORTOGRAM W/LOWER EXTREMITY N/A 12/28/2020   Procedure: ABDOMINAL AORTOGRAM W/LOWER EXTREMITY;  Surgeon: Cherre Robins, MD;  Location: [place] CV LAB;  Service: Cardiovascular;  Laterality: N/A;   APPENDECTOMY  10/01/1994   BIOPSY  08/06/2020   Procedure: BIOPSY;  Surgeon: Jackquline Denmark, MD;  Location: [facility] ENDOSCOPY;  Service: Endoscopy;;   COLON SURGERY  10/01/2006   6 inches of colon removed due to obstruction   COLONOSCOPY WITH PROPOFOL N/A 10/25/2016   Procedure: COLONOSCOPY WITH PROPOFOL;  Surgeon: Milus Banister, MD;  Location: [facility] ENDOSCOPY;  Service: Endoscopy;  Laterality: N/A;   COLONOSCOPY WITH PROPOFOL N/A 08/07/2020   Procedure: COLONOSCOPY WITH PROPOFOL;  Surgeon: Jackquline Denmark, MD;  Location: [facility] ENDOSCOPY;  Service: Endoscopy;  Laterality: N/A;   COLONOSCOPY WITH PROPOFOL N/A 08/27/2020   Procedure: COLONOSCOPY WITH PROPOFOL;  Surgeon: Lavena Bullion, DO;  Location: [facility] ENDOSCOPY;  Service: Gastroenterology;  Laterality: N/A;   CORONARY ANGIOPLASTY WITH STENT PLACEMENT  09/07/2014   "2"   ENTEROSCOPY N/A 08/27/2020   Procedure: ENTEROSCOPY;  Surgeon: Lavena Bullion, DO;  Location: [facility] ENDOSCOPY;  Service: Gastroenterology;  Laterality: N/A;  Push enteroscopy    ENTEROSCOPY N/A 06/12/2021   Procedure: ENTEROSCOPY;  Surgeon: Rush Landmark Telford Nab., MD;  Location: [facility]  ENDOSCOPY;  Service: Gastroenterology;  Laterality: N/A;   ESOPHAGOGASTRODUODENOSCOPY (EGD) WITH PROPOFOL N/A 08/06/2020   Procedure: ESOPHAGOGASTRODUODENOSCOPY (EGD) WITH PROPOFOL;  Surgeon: Jackquline Denmark, MD;  [facility] ENDOS[facility];  Service: Endoscopy;  Laterality: N/A;   GIVENS CAPSULE STUDY N/A 08/24/2020   Procedure: GIVENS CAPSULE STUDY;  Surgeon: Lavena Bullion, DO;  [facility]: [facility] ENDOS[facility];  Service: Gastroenterology;  Laterality: N/A;   HOT HEMOSTASIS N/A 08/07/2020   Procedure: HOT HEMOSTASIS (ARGON PLASMA COAGULATION/BICAP);  Surgeon: Jackquline Denmark, MD;  Location: [facility];  Service: Endoscopy;  Laterality: N/A;   HOT HEMOSTASIS N/A 08/27/2020   Procedure: HOT HEMOSTASIS (ARGON PLASMA COAGULATION/BICAP);  Surgeon: Lavena Bullion, DO;  [facility];  Service: Gastroenterology;  Laterality: N/A;   LEFT HEART CATH AND CORONARY ANGIOGRAPHY N/A 03/25/2018   Procedure: LEFT HEART CATH AND CORONARY ANGIOGRAPHY;  Surgeon: Nigel Mormon, MD;  Location: [place] CV LAB;  Service: Cardiovascular;  Laterality: N/A;   LEFT HEART CATH AND CORONARY ANGIOGRAPHY N/A 05/09/2021   Procedure: LEFT HEART CATH AND CORONARY ANGIOGRAPHY;  Surgeon: Adrian Prows, MD;  Location: [place] CV LAB;  Service: Cardiovascular;  Laterality: N/A;   LEFT HEART CATHETERIZATION WITH CORONARY ANGIOGRAM N/A 09/07/2014   Procedure: LEFT HEART CATHETERIZATION WITH CORONARY ANGIOGRAM;  Surgeon: Laverda Page, MD;  Location: [facility] CATH LAB;  Service: Cardiovascular;  Laterality: N/A;   LOWER EXTREMITY ANGIOGRAPHY  10/29/2017   Procedure: Lower Extremity Angiography;  Surgeon: Adrian Prows, MD;  Location: [place] CV LAB;  Service: Cardiovascular;;   PERIPHERAL VASCULAR CATHETERIZATION N/A 11/01/2015   Procedure: Renal Angiography;  Surgeon: Adrian Prows, MD;  Location: [place] CV LAB;  Service: Cardiovascular;  Laterality: N/A;   PERIPHERAL VASCULAR CATHETERIZATION N/A 04/10/2016   Procedure:  Renal Angiography;  Surgeon: Adrian Prows, MD;  Location: [place] CV LAB;  Service: Cardiovascular;  Laterality: N/A;   PERIPHERAL VASCULAR CATHETERIZATION  04/10/2016   Procedure: Peripheral Vascular Intervention;  Surgeon: Adrian Prows, MD;  Location: [place] CV LAB;  Service: Cardiovascular;;   PERIPHERAL VASCULAR INTERVENTION  10/29/2017   Procedure: PERIPHERAL VASCULAR INTERVENTION;  Surgeon: Adrian Prows, MD;  Location: [place] CV LAB;  Service: Cardiovascular;;   PERIPHERAL VASCULAR INTERVENTION Bilateral 12/28/2020   Procedure: PERIPHERAL VASCULAR INTERVENTION;  Surgeon: Cherre Robins, MD;  Location: [place] CV LAB;  Service: Cardiovascular;  Laterality: Bilateral;   POLYPECTOMY  08/07/2020   Procedure: POLYPECTOMY;  Surgeon: Jackquline Denmark, MD;  [facility];  Service: Endoscopy;;   POLYPECTOMY  08/27/2020   Procedure: POLYPECTOMY;  Surgeon: Lavena Bullion, DO;  [facility]OS[facility];  Service: Gastroenterology;;   POLYPECTOMY     RENAL ANGIOGRAPHY N/A 10/29/2017   Procedure: RENAL ANGIOGRAPHY;  Surgeon: Adrian Prows, MD;  Location: [place] CV LAB;  Service: Cardiovascular;  Laterality: N/A;   RENAL ANGIOGRAPHY N/A 05/10/2020   Procedure: RENAL ANGIOGRAPHY;  Surgeon: Nigel Mormon, MD;  Location: [place] CV LAB;  Service: Cardiovascular;  Laterality: N/A;   RIGHT OOPHORECTOMY Right 10/01/1994   SUBMUCOSAL TATTOO INJECTION  06/12/2021   Procedure: SUBMUCOSAL TATTOO INJECTION;  Surgeon: Irving Copas., MD;  [facility] ENDOS[facility];  Service: Gastroenterology;;   TOTAL ABDOMINAL HYSTERECTOMY  10/02/1995   UPPER GASTROINTESTINAL ENDOSCOPY      Current Outpatient Medications  Medication Sig Dispense Refill   albuterol (PROVENTIL HFA;VENTOLIN HFA) 108 (90 BASE) MCG/ACT inhaler Inhale 2 puffs into [place] lungs every 6 (six) hours as needed for wheezing or shortness of breath (asthma).     ALPRAZolam (XANAX) 1 MG tablet Take 0.5-1 tablets by mouth  See admin instructions. Take 1 mg by mouth at bedtime and an additional 0.5-1 mg once a day as needed for anxiety     amLODipine (NORVASC) 10 MG tablet TAKE 1 TABLET BY MOUTH ONCE DAILY ([place] taking differently: Take 10 mg by mouth daily.) 90 tablet 1   atorvastatin (LIPITOR) 80 MG tablet Take 80 mg by mouth at bedtime.     azelastine (ASTELIN) 0.1 % nasal spray Place 1 spray into both nostrils daily as needed (seasonal allergies).     ezetimibe (ZETIA) 10 MG tablet TAKE 1 TABLET BY MOUTH ONCE A DAY ([place] taking differently: Take 10 mg by mouth every morning.) 90 tablet 3   feeding supplement (ENSURE ENLIVE / ENSURE PLUS) LIQD Take 237 mLs by mouth daily. 237 mL 12   isosorbide mononitrate (IMDUR) 60 MG 24 hr tablet Take 1 tablet (60 mg total) by mouth daily. 90 tablet 0   labetalol (NORMODYNE) 200 MG tablet Take 1 tablet (200 mg total) by mouth 2 (two) times daily. 180 tablet 0   linaclotide (  LINZESS) 72 MCG capsule Take 1 capsule (72 mcg total) by mouth daily before breakfast. 30 capsule 2   mometasone-formoterol (DULERA) 100-5 MCG/ACT AERO Take 2 puffs first thing in am and then another 2 puffs about 12 hours later. ([place] taking differently: Inhale 2 puffs into [place] lungs 2 (two) times daily.) 1 Inhaler 11   nitroGLYCERIN (NITROSTAT) 0.4 MG SL tablet Place 1 tablet (0.4 mg total) under [place] tongue every 5 (five) minutes as needed for chest pain. 30 tablet 1   ondansetron (ZOFRAN-ODT) 4 MG disintegrating tablet Take 1 tablet (4 mg total) by mouth every 8 (eight) hours as needed for nausea or vomiting. 20 tablet 0   oxyCODONE-acetaminophen (PERCOCET/ROXICET) 5-325 MG tablet Take 1 tablet by mouth every 6 (six) hours as needed for severe pain. 15 tablet 0   pantoprazole (PROTONIX) 40 MG tablet TAKE 1 TABLET BY MOUTH TWICE (2) DAILY (Collins taking differently: Take 40 mg by mouth in [place] morning and at bedtime.) 180 tablet 2   promethazine (PHENERGAN) 25 MG tablet Take 25 mg by mouth 2 (two)  times daily as needed for nausea or vomiting.     venlafaxine XR (EFFEXOR XR) 150 MG 24 hr capsule Take 2 capsules (300 mg total) by mouth daily with breakfast.     No current facility-administered medications for this visit.    Allergies as of 08/01/2021 - Review Complete 08/01/2021  Allergen Reaction Noted   Doxycycline Anaphylaxis and Hives    Hydralazine Rash 08/16/2020   Hydrocodone-acetaminophen Nausea And Vomiting 06/18/2012   Aspirin Other (See Comments) 10/16/2011   Ibuprofen Other (See Comments) 07/25/2021   Tylenol [acetaminophen] Other (See Comments) 07/25/2021   Iohexol Itching and Other (See Comments) 10/16/2011    Family History  Problem Relation Age of Onset   Other Mother        many bowel obstructions   Heart disease Mother    Colon polyps Mother    Kidney cancer Father    Bone cancer Father    Diabetes Father    Heart disease Father    Heart disease Brother    Colon cancer Paternal Grandfather    Diabetes Daughter    Esophageal cancer Neg Hx    Rectal cancer Neg Hx    Stomach cancer Neg Hx     Social History   Socioeconomic History   Marital status: Divorced    Spouse name: Not on file   Number of children: 1   Years of education: Not on file   Highest education level: Not on file  Occupational History   Occupation: Scientist, research (life sciences): UNEMPLOYED  Tobacco Use   Smoking status: Some Days    Packs/day: 1.50    Years: 40.00    Pack years: 60.00    Types: Cigarettes   Smokeless tobacco: Never  Vaping Use   Vaping Use: Former   Substances: THC  Substance and Sexual Activity   Alcohol use: No   Drug use: Yes    Frequency: 3.0 times per week    Types: Marijuana    Comment: last use yesterday   Sexual activity: Yes    Birth control/protection: Post-menopausal  Other Topics Concern   Not on file  Social History Narrative   Not on file   Social Determinants of Health   Financial Resource Strain: Not on file  Food Insecurity:  Not on file  Transportation Needs: Not on file  Physical Activity: Not on file  Stress: Not on file  Social Connections: Not on file  Intimate Partner Violence: Not on file     Physical Exam: BP (!) 126/58   Pulse 88   Ht 5' 1.75" (1.568 m)   Wt 94 lb 12.8 oz (43 kg)   BMI 17.48 kg/m  Constitutional: Chronically ill-appearing, smells of marijuana Psychiatric: alert and oriented x3 Abdomen: soft, nontender, nondistended, no obvious ascites, no peritoneal signs, normal bowel sounds No peripheral edema noted in lower extremities Rectal examination with female assistant in [place] room.  No hemorrhoids, no anal fissures, when bearing down there was no evidence of hemorrhoids or rectal prolapse.  Assessment and plan: 60 y.o. female with chronic abdominal discomforts, significant vascular disease, significant and progressive renal disease, chronic constipation  She is going to start taking fiber supplements Citrucel on an every day basis.  She will continue her Linzess at 72 mcg strength once daily as well.  I hope that this will allow her to have a better easier formed bowel movement on a daily basis.  She will return to see me in 3 months and sooner if needed.  Please see [place] "[facility] Instructions" section for addition details about [place] plan.  [place] Loffler, MD [facility] 08/01/2021, 9:47 AM   Total time on date of encounter was 35 minutes (this included time spent preparing to see [name] reviewing records; obtaining and/or reviewing separately obtained history; performing a medically appropriate exam and/or evaluation; counseling and educating [name] and family if present; ordering medications, tests or procedures if applicable; and documenting clinical information in [facility] health record).

## 2021-08-01 NOTE — Patient Instructions (Signed)
If you are age 58 or younger, your body mass index should be between 19-25. Your Body mass index is 17.48 kg/m. If this is out of the aformentioned range listed, please consider follow up with your Primary Care Provider.  ________________________________________________________  The [facility] providers would like to encourage you to use [facility] to communicate with providers for non-urgent requests or questions.  Due to long hold times on the telephone, sending your provider a message by [facility] may be a faster and more efficient way to get a response.  Please allow 48 business hours for a response.  Please remember that this is for non-urgent requests.  _______________________________________________________  Please start taking citrucel (orange flavored) powder fiber supplement.  This may cause some bloating at first but that usually goes away. Begin with a small spoonful and work your way up to a large, heaping spoonful daily over a week.  CONTINUE: Linzess  You will need to follow up in our office in 3 months (Feb 2023).  We will contact you to schedule this appointment.  Thank you for entrusting me with your care and choosing [facility].  Dr Ardis Hughs

## 2021-08-02 ENCOUNTER — Other Ambulatory Visit: Payer: Self-pay | Admitting: Physician Assistant

## 2021-08-07 ENCOUNTER — Ambulatory Visit (INDEPENDENT_AMBULATORY_CARE_PROVIDER_SITE_OTHER)
Admission: RE | Admit: 2021-08-07 | Discharge: 2021-08-07 | Disposition: A | Discharge status: Home / Self Care | Payer: Medicare Other | Source: Ambulatory Visit | Attending: Vascular Surgery | Admitting: Vascular Surgery

## 2021-08-07 ENCOUNTER — Other Ambulatory Visit (HOSPITAL_COMMUNITY): Payer: Self-pay | Admitting: Vascular Surgery

## 2021-08-07 ENCOUNTER — Ambulatory Visit (HOSPITAL_COMMUNITY)
Admission: RE | Admit: 2021-08-07 | Discharge: 2021-08-07 | Disposition: A | Discharge status: Home / Self Care | Payer: Medicare Other | Source: Ambulatory Visit | Attending: Vascular Surgery | Admitting: Vascular Surgery

## 2021-08-07 ENCOUNTER — Other Ambulatory Visit: Payer: Self-pay

## 2021-08-07 ENCOUNTER — Other Ambulatory Visit: Payer: Self-pay | Admitting: Surgery

## 2021-08-07 DIAGNOSIS — N186 End stage renal disease: Secondary | ICD-10-CM | POA: Diagnosis not present

## 2021-08-07 DIAGNOSIS — N184 Chronic kidney disease, stage 4 (severe): Secondary | ICD-10-CM | POA: Insufficient documentation

## 2021-08-07 NOTE — Progress Notes (Signed)
[facility] OF [place]  ASSESSMENT / PLAN: 60 y.o. female status post SMA stenting for chronic mesenteric ischemia and re-do aorto-bi-iliac stenting for occlusive disease with history of terminal aortic stenting 12/28/20. Now with deteriorating renal function in need of dialysis access.  Recommend the following which can slow the progression of atherosclerosis and reduce the risk of major adverse cardiac / limb events:  Complete cessation from all tobacco products. Blood glucose control with goal A1c < 7%. Blood pressure control with goal blood pressure < 140/90 mmHg. Lipid reduction therapy with goal LDL-C <100 mg/dL (<70 if symptomatic from PAD).  Aspirin 81mg  PO QD.  Clopidogrel 75mg  PO QD. Atorvastatin 40-80mg  PO QD (or other "high intensity" statin therapy).  Plan left arm arteriovenous graft 08/16/21.   CHIEF COMPLAINT: Abdominal pain  HISTORY OF PRESENT ILLNESS:  Previously: [name] is a 60 y.o. female referred to clinic for evaluation of possible chronic mesenteric ischemia.  She has had a thorough work-up by gastroenterologist for constant generalized abdominal pain.  The pain is exacerbated by eating.  She reports she has lost nearly 30 pounds over the past year.  She reports she is afraid to eat.  01/10/21: Patient returns to clinic concerned about pain and "bulging" from her left groin.  She reports she continues to have some abdominal pain but has been able to eat.  04/11/21: still having abdominal pain. Planned to undergo endoscopy in near future. Having restless legs. No change in non-invasive studies.  08/07/21: Patient returns with deteriorating renal function. She is in need of permanent dialysis access. She is right handed. no history of central venous catheter. no history of pacemaker.   VASCULAR SURGICAL HISTORY:  Bilateral renal artery stenting by Dr. Einar Gip intervention 05/10/2020 Distal aortic stenting by Dr. Einar Gip with 10 x 29  Omnilink postdilated to 12 mm 10/29/2017 SMA stenting (6x73mm HercuLink); Terminal aortic stenting (8x84mm VBX post dilated to 39mm; Bilateral common iliac artery stenting (7x65mm VBX x 2) by me 12/28/20.  VASCULAR RISK FACTORS: Patient reports: -No history of cerebrovascular disease / stroke / transient ischemic attack. - No history of coronary artery disease. Coronary cath June 2019 unremarkable. - No history of diabetes mellitus (No results found for: HGBA1C). - Lifelong history of smoking. Actively smoking. - + history of hypertension.  - + history of chronic kidney disease (GFR 28). - + history of chronic obstructive pulmonary disease - HF with preserved ejection fraction (EF 60% January 2021).   Past Medical History:  Diagnosis Date   Anemia 2008   Anginal pain ([place])    Anxiety    Aortic stenosis    Arthritis    "all my joints ache" (09/07/2014)   Asthma    Bipolar 1 disorder ([place])    Blood transfusion without reported diagnosis    Bradycardia    Bruit    Carpal tunnel syndrome    Cataract    forming   Cervical cancer ([place]) 1985   CHF (congestive heart failure) ([place])    Chronic kidney disease (CKD), stage V (HCC)    COPD (chronic obstructive pulmonary disease) ([place]) 2000   Coronary artery disease    Daily headache    Depression 2000   Diverticulitis 2008   Emphysema of lung ([place])    GERD (gastroesophageal reflux disease)    Glaucoma    Heart murmur    History of colon polyps 2009   HLD (hyperlipidemia) 2013   Hypertension 2013   Hypovitaminosis D  IBS (irritable bowel syndrome) 2008   Myocardial infarction [facility])    2015   PAD (peripheral artery disease) ([place])    Pancreatitis 10/2011   Pneumonia 07/2014   RLS (restless legs syndrome)    Schizophrenia (HCC)    Shortness of breath    Skin cancer    Small bowel obstruction ([place]) 2008   Thyroid disease     Past Surgical History:  Procedure Laterality Date   ABDOMINAL ANGIOGRAM N/A 09/07/2014   Procedure:  ABDOMINAL ANGIOGRAM;  Surgeon: Laverda Page, MD;  Location: [facility] CATH LAB;  Service: Cardiovascular;  Laterality: N/A;   ABDOMINAL AORTOGRAM W/LOWER EXTREMITY N/A 12/28/2020   Procedure: ABDOMINAL AORTOGRAM W/LOWER EXTREMITY;  Surgeon: Cherre Robins, MD;  Location: [place] CV LAB;  Service: Cardiovascular;  Laterality: N/A;   APPENDECTOMY  10/01/1994   BIOPSY  08/06/2020   Procedure: BIOPSY;  Surgeon: Jackquline Denmark, MD;  Location: [facility] ENDOSCOPY;  Service: Endoscopy;;   COLON SURGERY  10/01/2006   6 inches of colon removed due to obstruction   COLONOSCOPY WITH PROPOFOL N/A 10/25/2016   Procedure: COLONOSCOPY WITH PROPOFOL;  Surgeon: Milus Banister, MD;  Location: [facility] ENDOSCOPY;  Service: Endoscopy;  Laterality: N/A;   COLONOSCOPY WITH PROPOFOL N/A 08/07/2020   Procedure: COLONOSCOPY WITH PROPOFOL;  Surgeon: Jackquline Denmark, MD;  Location: [facility]OS[facility];  Service: Endoscopy;  Laterality: N/A;   COLONOSCOPY WITH PROPOFOL N/A 08/27/2020   Procedure: COLONOSCOPY WITH PROPOFOL;  Surgeon: Lavena Bullion, DO;  Location: [facility]OS[facility];  Service: Gastroenterology;  Laterality: N/A;   CORONARY ANGIOPLASTY WITH STENT PLACEMENT  09/07/2014   "2"   ENTEROSCOPY N/A 08/27/2020   Procedure: ENTEROSCOPY;  Surgeon: Lavena Bullion, DO;  Location: [facility];  Service: Gastroenterology;  Laterality: N/A;  Push enteroscopy    ENTEROSCOPY N/A 06/12/2021   Procedure: ENTEROSCOPY;  Surgeon: Rush Landmark Telford Nab., MD;  Location: [facility];  Service: Gastroenterology;  Laterality: N/A;   ESOPHAGOGASTRODUODENOSCOPY (EGD) WITH PROPOFOL N/A 08/06/2020   Procedure: ESOPHAGOGASTRODUODENOSCOPY (EGD) WITH PROPOFOL;  Surgeon: Jackquline Denmark, MD;  [facility]: [facility];  Service: Endoscopy;  Laterality: N/A;   GIVENS CAPSULE STUDY N/A 08/24/2020   Procedure: GIVENS CAPSULE STUDY;  Surgeon: Lavena Bullion, DO;  Location: [facility];  Service: Gastroenterology;  Laterality: N/A;   HOT HEMOSTASIS N/A  08/07/2020   Procedure: HOT HEMOSTASIS (ARGON PLASMA COAGULATION/BICAP);  Surgeon: Jackquline Denmark, MD;  Location: [facility];  Service: Endoscopy;  Laterality: N/A;   HOT HEMOSTASIS N/A 08/27/2020   Procedure: HOT HEMOSTASIS (ARGON PLASMA COAGULATION/BICAP);  Surgeon: Lavena Bullion, DO;  [facility];  Service: Gastroenterology;  Laterality: N/A;   LEFT HEART CATH AND CORONARY ANGIOGRAPHY N/A 03/25/2018   Procedure: LEFT HEART CATH AND CORONARY ANGIOGRAPHY;  Surgeon: Nigel Mormon, MD;  Location: [place] CV LAB;  Service: Cardiovascular;  Laterality: N/A;   LEFT HEART CATH AND CORONARY ANGIOGRAPHY N/A 05/09/2021   Procedure: LEFT HEART CATH AND CORONARY ANGIOGRAPHY;  Surgeon: Adrian Prows, MD;  Location: [place] CV LAB;  Service: Cardiovascular;  Laterality: N/A;   LEFT HEART CATHETERIZATION WITH CORONARY ANGIOGRAM N/A 09/07/2014   Procedure: LEFT HEART CATHETERIZATION WITH CORONARY ANGIOGRAM;  Surgeon: Laverda Page, MD;  Location: [facility] CATH LAB;  Service: Cardiovascular;  Laterality: N/A;   LOWER EXTREMITY ANGIOGRAPHY  10/29/2017   Procedure: Lower Extremity Angiography;  Surgeon: Adrian Prows, MD;  Location: [place] CV LAB;  Service: Cardiovascular;;   PERIPHERAL VASCULAR CATHETERIZATION N/A 11/01/2015   Procedure: Renal Angiography;  Surgeon:  Adrian Prows, MD;  Location: [place] CV LAB;  Service: Cardiovascular;  Laterality: N/A;   PERIPHERAL VASCULAR CATHETERIZATION N/A 04/10/2016   Procedure: Renal Angiography;  Surgeon: Adrian Prows, MD;  Location: [place] CV LAB;  Service: Cardiovascular;  Laterality: N/A;   PERIPHERAL VASCULAR CATHETERIZATION  04/10/2016   Procedure: Peripheral Vascular Intervention;  Surgeon: Adrian Prows, MD;  Location: [place] CV LAB;  Service: Cardiovascular;;   PERIPHERAL VASCULAR INTERVENTION  10/29/2017   Procedure: PERIPHERAL VASCULAR INTERVENTION;  Surgeon: Adrian Prows, MD;  Location: [place] CV LAB;  Service: Cardiovascular;;    PERIPHERAL VASCULAR INTERVENTION Bilateral 12/28/2020   Procedure: PERIPHERAL VASCULAR INTERVENTION;  Surgeon: Cherre Robins, MD;  Location: [place] CV LAB;  Service: Cardiovascular;  Laterality: Bilateral;   POLYPECTOMY  08/07/2020   Procedure: POLYPECTOMY;  Surgeon: Jackquline Denmark, MD;  [facility];  Service: Endoscopy;;   POLYPECTOMY  08/27/2020   Procedure: POLYPECTOMY;  Surgeon: Lavena Bullion, DO;  Location: [facility];  Service: Gastroenterology;;   POLYPECTOMY     RENAL ANGIOGRAPHY N/A 10/29/2017   Procedure: RENAL ANGIOGRAPHY;  Surgeon: Adrian Prows, MD;  Location: [place] CV LAB;  Service: Cardiovascular;  Laterality: N/A;   RENAL ANGIOGRAPHY N/A 05/10/2020   Procedure: RENAL ANGIOGRAPHY;  Surgeon: Nigel Mormon, MD;  Location: [place] CV LAB;  Service: Cardiovascular;  Laterality: N/A;   RIGHT OOPHORECTOMY Right 10/01/1994   SUBMUCOSAL TATTOO INJECTION  06/12/2021   Procedure: SUBMUCOSAL TATTOO INJECTION;  Surgeon: Irving Copas., MD;  Location: [facility];  Service: Gastroenterology;;   TOTAL ABDOMINAL HYSTERECTOMY  10/02/1995   UPPER GASTROINTESTINAL ENDOSCOPY      Family History  Problem Relation Age of Onset   Other Mother        many bowel obstructions   Heart disease Mother    Colon polyps Mother    Kidney cancer Father    Bone cancer Father    Diabetes Father    Heart disease Father    Heart disease Brother    Colon cancer Paternal Grandfather    Diabetes Daughter    Esophageal cancer Neg Hx    Rectal cancer Neg Hx    Stomach cancer Neg Hx     Social History   Socioeconomic History   Marital status: Divorced    Spouse name: Not on file   Number of children: 1   Years of education: Not on file   Highest education level: Not on file  Occupational History   Occupation: Scientist, research (life sciences): UNEMPLOYED  Tobacco Use   Smoking status: Some Days    Packs/day: 1.50    Years: 40.00    Pack years:  60.00    Types: Cigarettes   Smokeless tobacco: Never  Vaping Use   Vaping Use: Former   Substances: THC  Substance and Sexual Activity   Alcohol use: No   Drug use: Yes    Frequency: 3.0 times per week    Types: Marijuana    Comment: last use yesterday   Sexual activity: Yes    Birth control/protection: Post-menopausal  Other Topics Concern   Not on file  Social History Narrative   Not on file   Social Determinants of Health   Financial Resource Strain: Not on file  Food Insecurity: Not on file  Transportation Needs: Not on file  Physical Activity: Not on file  Stress: Not on file  Social Connections: Not on file  Intimate Partner Violence: Not on file  Allergies  Allergen Reactions   Doxycycline Anaphylaxis and Hives   Hydralazine Rash   Hydrocodone-Acetaminophen Nausea And Vomiting   Aspirin Other (See Comments)    Internal bleeding- MD SAID to not take this   Ibuprofen Other (See Comments)    Caused internal bleeding   Tylenol [Acetaminophen] Other (See Comments)    MD told the patient to not take this   Iohexol Itching and Other (See Comments)    Pt has itching nose after iv contrast injection    Current Outpatient Medications  Medication Sig Dispense Refill   albuterol (PROVENTIL HFA;VENTOLIN HFA) 108 (90 BASE) MCG/ACT inhaler Inhale 2 puffs into the lungs every 6 (six) hours as needed for wheezing or shortness of breath (asthma).     ALPRAZolam (XANAX) 1 MG tablet Take 0.5-1 tablets by mouth See admin instructions. Take 1 mg by mouth at bedtime and an additional 0.5-1 mg once a day as needed for anxiety     amLODipine (NORVASC) 10 MG tablet TAKE 1 TABLET BY MOUTH ONCE DAILY (Patient taking differently: Take 10 mg by mouth daily.) 90 tablet 1   atorvastatin (LIPITOR) 80 MG tablet Take 80 mg by mouth at bedtime.     azelastine (ASTELIN) 0.1 % nasal spray Place 1 spray into both nostrils daily as needed (seasonal allergies).     ezetimibe (ZETIA) 10 MG tablet  TAKE 1 TABLET BY MOUTH ONCE A DAY (Patient taking differently: Take 10 mg by mouth every morning.) 90 tablet 3   feeding supplement (ENSURE ENLIVE / ENSURE PLUS) LIQD Take 237 mLs by mouth daily. 237 mL 12   isosorbide mononitrate (IMDUR) 60 MG 24 hr tablet Take 1 tablet (60 mg total) by mouth daily. 90 tablet 0   labetalol (NORMODYNE) 200 MG tablet Take 1 tablet (200 mg total) by mouth 2 (two) times daily. 180 tablet 0   LINZESS 72 MCG capsule TAKE 1 CAPSULE BY MOUTH ONCE DAILY BEFORE BREAKFAST 30 capsule 2   methylcellulose (CITRUCEL) oral powder Take 1 packet by mouth daily.     mometasone-formoterol (DULERA) 100-5 MCG/ACT AERO Take 2 puffs first thing in am and then another 2 puffs about 12 hours later. (Patient taking differently: Inhale 2 puffs into the lungs 2 (two) times daily.) 1 Inhaler 11   nitroGLYCERIN (NITROSTAT) 0.4 MG SL tablet Place 1 tablet (0.4 mg total) under the tongue every 5 (five) minutes as needed for chest pain. 30 tablet 1   ondansetron (ZOFRAN-ODT) 4 MG disintegrating tablet Take 1 tablet (4 mg total) by mouth every 8 (eight) hours as needed for nausea or vomiting. 20 tablet 0   oxyCODONE-acetaminophen (PERCOCET/ROXICET) 5-325 MG tablet Take 1 tablet by mouth every 6 (six) hours as needed for severe pain. 15 tablet 0   pantoprazole (PROTONIX) 40 MG tablet TAKE 1 TABLET BY MOUTH TWICE (2) DAILY (Patient taking differently: Take 40 mg by mouth in the morning and at bedtime.) 180 tablet 2   promethazine (PHENERGAN) 25 MG tablet Take 25 mg by mouth 2 (two) times daily as needed for nausea or vomiting.     venlafaxine XR (EFFEXOR XR) 150 MG 24 hr capsule Take 2 capsules (300 mg total) by mouth daily with breakfast.     No current facility-administered medications for this visit.    REVIEW OF SYSTEMS:  [X]  denotes positive finding, [ ]  denotes negative finding Cardiac  Comments:  Chest pain or chest pressure:    Shortness of breath upon exertion:    Short of  breath when  lying flat:    Irregular heart rhythm:        Vascular    Pain in calf, thigh, or hip brought on by ambulation:    Pain in feet at night that wakes you up from your sleep:     Blood clot in your veins:    Leg swelling:         Pulmonary    Oxygen at home:    Productive cough:     Wheezing:         Neurologic    Sudden weakness in arms or legs:     Sudden numbness in arms or legs:     Sudden onset of difficulty speaking or slurred speech:    Temporary loss of vision in one eye:     Problems with dizziness:         Gastrointestinal    Blood in stool:     Vomited blood:         Genitourinary    Burning when urinating:     Blood in urine:        Psychiatric    Major depression:         Hematologic    Bleeding problems:    Problems with blood clotting too easily:        Skin    Rashes or ulcers:        Constitutional    Fever or chills:      PHYSICAL EXAM  There were no vitals filed for this visit.  Constitutional: Chronically ill appearing. No distress. Appears under nourished.  Neurologic: CN intact. no focal findings. no sensory loss. Psychiatric: Mood and affect symmetric and appropriate. Eyes: No icterus. No conjunctival pallor. Ears, nose, throat: mucous membranes moist. Midline trachea.  Cardiac: regular rate and rhythm.  Respiratory: unlabored. Abdominal: soft, non-tender, non-distended.  Peripheral vascular:  2+ L radial and brachial pulse  2+ femoral pulses  trace DP pulses  Extremity: No edema. No cyanosis. No pallor.  Skin: No gangrene. No ulceration.  Lymphatic: No Stemmer's sign. No palpable lymphadenopathy.  PERTINENT LABORATORY AND RADIOLOGIC DATA  Most recent CBC CBC Latest Ref Rng & Units 07/25/2021 06/13/2021 06/12/2021  WBC 4.0 - 10.5 K/uL 8.7 5.6 5.9  Hemoglobin 12.0 - 15.0 g/dL 10.2(L) 9.8(L) 10.8(L)  Hematocrit 36.0 - 46.0 % 29.9(L) 30.0(L) 32.2(L)  Platelets 150 - 400 K/uL 405(H) 254 277     Most recent CMP CMP Latest Ref Rng  & Units 07/25/2021 06/13/2021 06/12/2021  Glucose 70 - 99 mg/dL 98 89 87  BUN 6 - 20 mg/dL 35(H) 33(H) 36(H)  Creatinine 0.44 - 1.00 mg/dL 5.17(H) 3.49(H) 3.58(H)  Sodium 135 - 145 mmol/L 134(L) 135 135  Potassium 3.5 - 5.1 mmol/L 2.9(L) 4.6 4.9  Chloride 98 - 111 mmol/L 103 108 110  CO2 22 - 32 mmol/L 21(L) 19(L) 20(L)  Calcium 8.9 - 10.3 mg/dL 8.0(L) 8.4(L) 8.3(L)  Total Protein 6.5 - 8.1 g/dL 6.6 - -  Total Bilirubin 0.3 - 1.2 mg/dL 0.3 - -  Alkaline Phos 38 - 126 U/L 175(H) - -  AST 15 - 41 U/L 15 - -  ALT 0 - 44 U/L 11 - -    LDL Chol Calc (NIH)  Date Value Ref Range Status  03/31/2020 51 0 - 99 mg/dL Final    Mesenteric Duplex 01/16/21 SMA stent not well visualized. Good flow in distal SMA.  Mesenteric Duplex 04/11/21 SMA stent not well visualized. Good flow  in distal SMA.  ABI 01/16/21 Normalized ankle indices after stenting  ABI 04/11/21 Normal ankle indices  Preop AVF studies 08/08/21 Marginal RUE basilic vein. No other suitable vein in either upper extremity.  Yevonne Aline. Stanford Breed, MD [facility] Phone Number: [phone] 08/07/2021 10:53 AM

## 2021-08-08 ENCOUNTER — Encounter: Payer: Self-pay | Admitting: Vascular Surgery

## 2021-08-08 ENCOUNTER — Other Ambulatory Visit: Payer: Self-pay | Admitting: *Deleted

## 2021-08-08 ENCOUNTER — Ambulatory Visit (INDEPENDENT_AMBULATORY_CARE_PROVIDER_SITE_OTHER): Payer: Medicare Other | Admitting: Vascular Surgery

## 2021-08-08 VITALS — BP 141/76 | HR 74 | Temp 97.8°F | Resp 20 | Ht 61.0 in | Wt 95.0 lb

## 2021-08-08 DIAGNOSIS — N185 Chronic kidney disease, stage 5: Secondary | ICD-10-CM | POA: Diagnosis not present

## 2021-08-14 ENCOUNTER — Telehealth: Payer: Self-pay | Admitting: Gastroenterology

## 2021-08-14 DIAGNOSIS — K625 Hemorrhage of anus and rectum: Secondary | ICD-10-CM

## 2021-08-14 DIAGNOSIS — R195 Other fecal abnormalities: Secondary | ICD-10-CM

## 2021-08-14 NOTE — Telephone Encounter (Signed)
Inbound call from patient states she is experiencing abd pain and rectal bleeding. States she had a bowel movement and it was only blood.

## 2021-08-14 NOTE — Telephone Encounter (Signed)
The pt is calling to report 2 episodes of dark stools today with some abd discomfort on the left lower and right upper abdomen.  She states her stools are loose.  She is taking fiber and linzess 72 mcg daily. She has a history of mesenteric ischemia.  She has been advised to go on a liquid diet and monitor the bleeding.  She was told to go to the ED if the pain or bleeding continues thru the evening.  I will forward to Dr Ardis Hughs for review.

## 2021-08-15 ENCOUNTER — Inpatient Hospital Stay (HOSPITAL_COMMUNITY)
Admission: EM | Admit: 2021-08-15 | Discharge: 2021-08-22 | DRG: 377 | Disposition: A | Discharge status: Home / Self Care | Payer: Medicare Other | Source: Ambulatory Visit | Attending: Internal Medicine | Admitting: Internal Medicine

## 2021-08-15 ENCOUNTER — Encounter (HOSPITAL_COMMUNITY): Payer: Self-pay

## 2021-08-15 ENCOUNTER — Emergency Department (HOSPITAL_COMMUNITY): Payer: Medicare Other

## 2021-08-15 ENCOUNTER — Encounter (HOSPITAL_COMMUNITY): Payer: Self-pay | Admitting: Vascular Surgery

## 2021-08-15 ENCOUNTER — Other Ambulatory Visit (INDEPENDENT_AMBULATORY_CARE_PROVIDER_SITE_OTHER): Payer: Medicare Other

## 2021-08-15 ENCOUNTER — Telehealth: Payer: Self-pay

## 2021-08-15 DIAGNOSIS — K5521 Angiodysplasia of colon with hemorrhage: Principal | ICD-10-CM | POA: Diagnosis present

## 2021-08-15 DIAGNOSIS — Z885 Allergy status to narcotic agent status: Secondary | ICD-10-CM

## 2021-08-15 DIAGNOSIS — Z8701 Personal history of pneumonia (recurrent): Secondary | ICD-10-CM

## 2021-08-15 DIAGNOSIS — Z87892 Personal history of anaphylaxis: Secondary | ICD-10-CM

## 2021-08-15 DIAGNOSIS — F209 Schizophrenia, unspecified: Secondary | ICD-10-CM | POA: Diagnosis present

## 2021-08-15 DIAGNOSIS — N186 End stage renal disease: Secondary | ICD-10-CM | POA: Diagnosis present

## 2021-08-15 DIAGNOSIS — K529 Noninfective gastroenteritis and colitis, unspecified: Secondary | ICD-10-CM | POA: Diagnosis present

## 2021-08-15 DIAGNOSIS — R110 Nausea: Secondary | ICD-10-CM

## 2021-08-15 DIAGNOSIS — Z79891 Long term (current) use of opiate analgesic: Secondary | ICD-10-CM

## 2021-08-15 DIAGNOSIS — G2581 Restless legs syndrome: Secondary | ICD-10-CM | POA: Diagnosis present

## 2021-08-15 DIAGNOSIS — F419 Anxiety disorder, unspecified: Secondary | ICD-10-CM | POA: Diagnosis present

## 2021-08-15 DIAGNOSIS — Z90721 Acquired absence of ovaries, unilateral: Secondary | ICD-10-CM

## 2021-08-15 DIAGNOSIS — E079 Disorder of thyroid, unspecified: Secondary | ICD-10-CM | POA: Diagnosis present

## 2021-08-15 DIAGNOSIS — Z90710 Acquired absence of both cervix and uterus: Secondary | ICD-10-CM

## 2021-08-15 DIAGNOSIS — I252 Old myocardial infarction: Secondary | ICD-10-CM

## 2021-08-15 DIAGNOSIS — J9601 Acute respiratory failure with hypoxia: Secondary | ICD-10-CM

## 2021-08-15 DIAGNOSIS — K922 Gastrointestinal hemorrhage, unspecified: Secondary | ICD-10-CM | POA: Diagnosis present

## 2021-08-15 DIAGNOSIS — F319 Bipolar disorder, unspecified: Secondary | ICD-10-CM | POA: Diagnosis present

## 2021-08-15 DIAGNOSIS — K625 Hemorrhage of anus and rectum: Secondary | ICD-10-CM | POA: Diagnosis not present

## 2021-08-15 DIAGNOSIS — Z886 Allergy status to analgesic agent status: Secondary | ICD-10-CM

## 2021-08-15 DIAGNOSIS — R0600 Dyspnea, unspecified: Secondary | ICD-10-CM

## 2021-08-15 DIAGNOSIS — J159 Unspecified bacterial pneumonia: Secondary | ICD-10-CM | POA: Diagnosis present

## 2021-08-15 DIAGNOSIS — E871 Hypo-osmolality and hyponatremia: Secondary | ICD-10-CM | POA: Diagnosis present

## 2021-08-15 DIAGNOSIS — Z8541 Personal history of malignant neoplasm of cervix uteri: Secondary | ICD-10-CM

## 2021-08-15 DIAGNOSIS — D62 Acute posthemorrhagic anemia: Secondary | ICD-10-CM | POA: Diagnosis present

## 2021-08-15 DIAGNOSIS — Z20822 Contact with and (suspected) exposure to covid-19: Secondary | ICD-10-CM | POA: Diagnosis present

## 2021-08-15 DIAGNOSIS — F17210 Nicotine dependence, cigarettes, uncomplicated: Secondary | ICD-10-CM | POA: Diagnosis present

## 2021-08-15 DIAGNOSIS — J439 Emphysema, unspecified: Secondary | ICD-10-CM | POA: Diagnosis present

## 2021-08-15 DIAGNOSIS — I739 Peripheral vascular disease, unspecified: Secondary | ICD-10-CM | POA: Diagnosis present

## 2021-08-15 DIAGNOSIS — I5032 Chronic diastolic (congestive) heart failure: Secondary | ICD-10-CM | POA: Diagnosis present

## 2021-08-15 DIAGNOSIS — Z881 Allergy status to other antibiotic agents status: Secondary | ICD-10-CM

## 2021-08-15 DIAGNOSIS — I2510 Atherosclerotic heart disease of native coronary artery without angina pectoris: Secondary | ICD-10-CM | POA: Diagnosis present

## 2021-08-15 DIAGNOSIS — M159 Polyosteoarthritis, unspecified: Secondary | ICD-10-CM | POA: Diagnosis present

## 2021-08-15 DIAGNOSIS — D649 Anemia, unspecified: Secondary | ICD-10-CM

## 2021-08-15 DIAGNOSIS — M898X9 Other specified disorders of bone, unspecified site: Secondary | ICD-10-CM | POA: Diagnosis present

## 2021-08-15 DIAGNOSIS — Z85828 Personal history of other malignant neoplasm of skin: Secondary | ICD-10-CM

## 2021-08-15 DIAGNOSIS — Z955 Presence of coronary angioplasty implant and graft: Secondary | ICD-10-CM

## 2021-08-15 DIAGNOSIS — E869 Volume depletion, unspecified: Secondary | ICD-10-CM | POA: Diagnosis present

## 2021-08-15 DIAGNOSIS — I132 Hypertensive heart and chronic kidney disease with heart failure and with stage 5 chronic kidney disease, or end stage renal disease: Secondary | ICD-10-CM | POA: Diagnosis present

## 2021-08-15 DIAGNOSIS — K5730 Diverticulosis of large intestine without perforation or abscess without bleeding: Secondary | ICD-10-CM | POA: Diagnosis present

## 2021-08-15 DIAGNOSIS — E785 Hyperlipidemia, unspecified: Secondary | ICD-10-CM | POA: Diagnosis present

## 2021-08-15 DIAGNOSIS — J189 Pneumonia, unspecified organism: Secondary | ICD-10-CM

## 2021-08-15 DIAGNOSIS — Z888 Allergy status to other drugs, medicaments and biological substances status: Secondary | ICD-10-CM

## 2021-08-15 DIAGNOSIS — Z8249 Family history of ischemic heart disease and other diseases of the circulatory system: Secondary | ICD-10-CM

## 2021-08-15 DIAGNOSIS — R195 Other fecal abnormalities: Secondary | ICD-10-CM

## 2021-08-15 DIAGNOSIS — H409 Unspecified glaucoma: Secondary | ICD-10-CM | POA: Diagnosis present

## 2021-08-15 DIAGNOSIS — K551 Chronic vascular disorders of intestine: Secondary | ICD-10-CM | POA: Diagnosis present

## 2021-08-15 DIAGNOSIS — N179 Acute kidney failure, unspecified: Secondary | ICD-10-CM | POA: Diagnosis present

## 2021-08-15 DIAGNOSIS — Z972 Presence of dental prosthetic device (complete) (partial): Secondary | ICD-10-CM

## 2021-08-15 DIAGNOSIS — K219 Gastro-esophageal reflux disease without esophagitis: Secondary | ICD-10-CM | POA: Diagnosis present

## 2021-08-15 DIAGNOSIS — R079 Chest pain, unspecified: Secondary | ICD-10-CM

## 2021-08-15 LAB — BASIC METABOLIC PANEL
Anion gap: 12 (ref 5–15)
BUN: 47 mg/dL — ABNORMAL HIGH (ref 6–20)
CO2: 21 mmol/L — ABNORMAL LOW (ref 22–32)
Calcium: 8.3 mg/dL — ABNORMAL LOW (ref 8.9–10.3)
Chloride: 100 mmol/L (ref 98–111)
Creatinine, Ser: 5.61 mg/dL — ABNORMAL HIGH (ref 0.44–1.00)
GFR, Estimated: 8 mL/min — ABNORMAL LOW (ref >60)
Glucose, Bld: 100 mg/dL — ABNORMAL HIGH (ref 70–99)
Potassium: 3.4 mmol/L — ABNORMAL LOW (ref 3.5–5.1)
Sodium: 133 mmol/L — ABNORMAL LOW (ref 135–145)

## 2021-08-15 LAB — CBC WITH DIFFERENTIAL/PLATELET
Abs Immature Granulocytes: 0.03 10*3/uL (ref 0.00–0.07)
Basophils Absolute: 0.1 10*3/uL (ref 0.0–0.1)
Basophils Absolute: 0.1 10*3/uL (ref 0.0–0.1)
Basophils Relative: 1.1 % (ref 0.0–3.0)
Basophils Relative: 1 %
Eosinophils Absolute: 0.2 10*3/uL (ref 0.0–0.7)
Eosinophils Absolute: 0.3 10*3/uL (ref 0.0–0.5)
Eosinophils Relative: 2.5 % (ref 0.0–5.0)
Eosinophils Relative: 3 %
HCT: 27.2 % — ABNORMAL LOW (ref 36.0–46.0)
HCT: 27.4 % — ABNORMAL LOW (ref 36.0–46.0)
Hemoglobin: 9.4 g/dL — ABNORMAL LOW (ref 12.0–15.0)
Hemoglobin: 9.4 g/dL — ABNORMAL LOW (ref 12.0–15.0)
Immature Granulocytes: 0 %
Lymphocytes Relative: 10.2 % — ABNORMAL LOW (ref 12.0–46.0)
Lymphocytes Relative: 11 %
Lymphs Abs: 0.9 10*3/uL (ref 0.7–4.0)
Lymphs Abs: 1 10*3/uL (ref 0.7–4.0)
MCH: 30.6 pg (ref 26.0–34.0)
MCHC: 34.3 g/dL (ref 30.0–36.0)
MCHC: 34.3 g/dL (ref 30.0–36.0)
MCV: 89.7 fl (ref 78.0–100.0)
MCV: 89.3 fL (ref 80.0–100.0)
Monocytes Absolute: 0.5 10*3/uL (ref 0.1–1.0)
Monocytes Absolute: 0.5 10*3/uL (ref 0.1–1.0)
Monocytes Relative: 5.6 % (ref 3.0–12.0)
Monocytes Relative: 6 %
Neutro Abs: 7 10*3/uL (ref 1.4–7.7)
Neutro Abs: 6.8 10*3/uL (ref 1.7–7.7)
Neutrophils Relative %: 80.6 % — ABNORMAL HIGH (ref 43.0–77.0)
Neutrophils Relative %: 79 %
Platelets: 509 10*3/uL — ABNORMAL HIGH (ref 150.0–400.0)
Platelets: 433 10*3/uL — ABNORMAL HIGH (ref 150–400)
RBC: 3.04 Mil/uL — ABNORMAL LOW (ref 3.87–5.11)
RBC: 3.07 MIL/uL — ABNORMAL LOW (ref 3.87–5.11)
RDW: 18.3 % — ABNORMAL HIGH (ref 11.5–15.5)
RDW: 17.4 % — ABNORMAL HIGH (ref 11.5–15.5)
WBC: 8.7 10*3/uL (ref 4.0–10.5)
WBC: 8.6 10*3/uL (ref 4.0–10.5)
nRBC: 0 % (ref 0.0–0.2)

## 2021-08-15 LAB — POC OCCULT BLOOD, ED: Fecal Occult Bld: POSITIVE — AB

## 2021-08-15 MED ORDER — OXYCODONE HCL 5 MG PO TABS
5.0000 mg | ORAL_TABLET | Freq: Once | ORAL | Status: AC
  Administered 2021-08-15: 5 mg via ORAL
  Filled 2021-08-15: qty 1

## 2021-08-15 NOTE — Progress Notes (Signed)
DUE TO COVID-19 ONLY ONE VISITOR IS ALLOWED TO COME WITH YOU AND STAY IN THE WAITING ROOM ONLY DURING PRE OP AND PROCEDURE DAY OF SURGERY.   PCP - Dr Adella Hare Cardiologist - Dr Hall Busing - Dr Owens Loffler  Chest x-ray - 07/25/21 (1V) EKG - 07/25/21 Stress Test - 05/22/21 PVC ECHO - 10/16/19 Cardiac Cath - 05/09/21  ICD Pacemaker/Loop - n/a  Sleep Study -  n/a CPAP - none  Anesthesia review: Yes  STOP now taking any Aspirin (unless otherwise instructed by your surgeon), Aleve, Naproxen, Ibuprofen, Motrin, Advil, Goody's, BC's, all herbal medications, fish oil, and all vitamins.   Coronavirus Screening Covid test n/a Ambulatory Surgery  Do you have any of the following symptoms:  Cough yes/no: No Fever (>100.84F)  yes/no: No Runny nose yes/no: No Sore throat yes/no: No Difficulty breathing/shortness of breath  yes/no: No  Have you traveled in the last 14 days and where? yes/no: No  Patient verbalized understanding of instructions that were given via phone.

## 2021-08-15 NOTE — ED Provider Notes (Signed)
Emergency Medicine Provider Triage Evaluation Note  [name] , a 60 y.o. female  was evaluated in triage.  Pt complains of dark stools with bright red when she wipes.  History of GI bleeding.  Was seen by primary care who drew her blood and told her that she needed to go to the hospital due to an abnormal value.  She is unsure of the value however has had to have multiple blood transfusions in the past.  Review of Systems  Positive: Per above Negative: Shortness of breath or syncope  Physical Exam  BP (!) 158/85 (BP Location: Right Arm)   Pulse 86   Temp 98.2 F (36.8 C) (Oral)   Resp 18   SpO2 92%  Gen:   Awake, no distress   Resp:  Normal effort  MSK:   Moves extremities without difficulty  Other:  Ill-appearing  Medical Decision Making  Medically screening exam initiated at 3:35 PM.  Appropriate orders placed.  [name] was informed that the remainder of the evaluation will be completed by another provider, this initial triage assessment does not replace that evaluation, and the importance of remaining in the ED until their evaluation is complete.     Darliss Ridgel 08/15/21 1536    [name], MD 08/16/21 [phone]

## 2021-08-15 NOTE — Telephone Encounter (Signed)
The pt has been advised and the lab order has been entered.  She will come in today for the CBC.

## 2021-08-15 NOTE — ED Provider Notes (Signed)
[name] DEPT Provider Note   CSN: [id number] Arrival date & time: 08/15/21  1523     History Chief Complaint  Patient presents with   Abdominal Pain   Rectal Bleeding   Abnormal Lab    [name] is a 60 y.o. female.  Patient referred to the ED today by her gastroenterologist for rectal bleeding and anemia.  She has a history of the same.  States has had rectal bleeding intermittently for the past 1 week.  Stools are black and bloody as well as some red with wiping and red filling the toilet bowl.  She is feeling weak and dizzy and lightheaded.  Having worsening shortness of breath and was concerned that her "blood counts are dropping".  Had outpatient labs today that showed a hemoglobin in the 9 range.  She is not anticoagulated.  She denies vomiting but has had nausea.  Has had worsening of her chronic shortness of breath and some intermittent right-sided chest pain that comes and goes over the past several days.  States the pain last about 5 minutes at a time and she does not have any pain currently. Is worsening of her chronic abdominal pain which she says is due to mesenteric ischemia. Was referred for a dialysis fistula placement today which was canceled due to her rectal bleeding.  She has not yet started dialysis.  The history is provided by the patient.  Abdominal Pain Associated symptoms: chest pain, fatigue, hematochezia and shortness of breath   Associated symptoms: no cough, no dysuria, no fever, no hematuria, no nausea and no vomiting   Rectal Bleeding Associated symptoms: abdominal pain, dizziness and light-headedness   Associated symptoms: no fever and no vomiting   Abnormal Lab     Past Medical History:  Diagnosis Date   Anemia 2008   Anginal pain (HCC)    Anxiety    Aortic stenosis    abominal aorta, s/p distal aortic stent 09/28/18, 12/01/20   Arthritis    "all my joints ache" (09/07/2014)   Asthma    Bipolar 1  disorder ([place])    Blood transfusion without reported diagnosis    Bradycardia    Bruit    Carpal tunnel syndrome    Cataract    forming   Cervical cancer ([place]) 1985   CHF (congestive heart failure) ([place])    Chronic kidney disease (CKD), stage V (HCC)    COPD (chronic obstructive pulmonary disease) ([place]) 2000   Coronary artery disease    Daily headache    Depression 2000   Diverticulitis 2008   Emphysema of lung ([place])    GERD (gastroesophageal reflux disease)    Glaucoma    Heart murmur    History of colon polyps 2009   HLD (hyperlipidemia) 2013   Hypertension 2013   Hypovitaminosis D    IBS (irritable bowel syndrome) 2008   Myocardial infarction [facility])    2015   PAD (peripheral artery disease) (HCC)    Pancreatitis 10/2011   Pneumonia 07/2014   RLS (restless legs syndrome)    Schizophrenia (HCC)    Shortness of breath    Skin cancer    Small bowel obstruction ([place]) 2008   Thyroid disease     Patient Active Problem List   Diagnosis Date Noted   Dark stools    Diverticulosis    Adverse reaction to antiplatelet agent, sequela    AKI (acute kidney injury) ([place]) 05/11/2021   Chest pain, rule out acute myocardial  infarction 05/09/2021   Abnormal EKG    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   Chronic mesenteric ischemia ([place]) 12/28/2020   Polyp of ascending colon    AVM (arteriovenous malformation) of small bowel, acquired with hemorrhage    GIB (gastrointestinal bleeding) 08/23/2020   Hematochezia    Diverticulosis of colon without hemorrhage    Pain of upper abdomen    Angiodysplasia of colon 08/16/2020   GI bleed 08/06/2020   Acute GI bleeding 08/05/2020   Acute blood loss anemia 08/05/2020   Renal artery stenosis (HCC) 05/10/2020   Abdominal aortic stenosis 11/30/2018   Pure hypercholesterolemia 10/22/2018   Abnormal stress test 03/21/2018   At high risk for injury related to fall 12/26/2017   Renal artery stenosis, native, bilateral ([place]) 10/27/2017    Benign neoplasm of transverse colon    Benign neoplasm of descending colon    Benign neoplasm of sigmoid colon    Abnormality on screening test 06/22/2016   Hypertensive urgency 11/01/2015   Chest pain 11/01/2015   History of partial colectomy 10/19/2014   Renovascular hypertension, malignant 09/07/2014   Anemia, chronic disease 07/30/2014   Heart failure with preserved ejection fraction ([place]) 07/02/2014   COPD GOLD 0 / still smoking 07/02/2014   CKD (chronic kidney disease) stage 4, GFR 15-29 ml/min ([place]) 07/02/2014   Protein-calorie malnutrition, severe ([place]) 07/02/2014   Hyperparathyroidism, 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   Postsurgical menopause 08/14/2013   Vaginal atrophy 08/14/2013   Screening for breast cancer 07/21/2012   Diverticulitis of colon with perforation 06/18/2012   Affective bipolar disorder ([place]) 02/02/2009   ADHESIONS, INTESTINAL W/OBSTRUCTION 08/10/2008   Irritable bowel syndrome 08/10/2008   Abdominal pain, generalized 08/10/2008   FECAL OCCULT BLOOD 08/10/2008   Anemia, iron deficiency 08/10/2008   Anxiety state 08/09/2008   DEPRESSION 08/09/2008   ASTHMA 08/09/2008   ESOPHAGEAL STRICTURE 08/09/2008   GERD 08/09/2008   HIATAL HERNIA 08/09/2008   Diverticulosis of large intestine 08/09/2008   ARTHRITIS 08/09/2008   HEADACHE, CHRONIC 08/09/2008   SKIN CANCER, HX OF 08/09/2008   COLONIC POLYPS, HYPERPLASTIC, HX OF 08/09/2008   Cigarette smoker 05/17/2008   Schizophrenia ([place]) 04/30/2008    Past Surgical History:  Procedure Laterality Date   ABDOMINAL ANGIOGRAM N/A 09/07/2014   Procedure: ABDOMINAL ANGIOGRAM;  Surgeon: Laverda Page, MD;  Location: [facility] CATH LAB;  Service: Cardiovascular;  Laterality: N/A;   ABDOMINAL AORTOGRAM W/LOWER EXTREMITY N/A 12/28/2020   Procedure: ABDOMINAL AORTOGRAM W/LOWER EXTREMITY;  Surgeon: Cherre Robins, MD;  Location: [facility] CV LAB;  Service: Cardiovascular;  Laterality: N/A;   APPENDECTOMY  10/01/1994    BIOPSY  08/06/2020   Procedure: BIOPSY;  Surgeon: Jackquline Denmark, MD;  Location: [facility] ENDOSCOPY;  Service: Endoscopy;;   COLON SURGERY  10/01/2006   6 inches of colon removed due to obstruction   COLONOSCOPY WITH PROPOFOL N/A 10/25/2016   Procedure: COLONOSCOPY WITH PROPOFOL;  Surgeon: Milus Banister, MD;  Location: [facility] ENDOSCOPY;  Service: Endoscopy;  Laterality: N/A;   COLONOSCOPY WITH PROPOFOL N/A 08/07/2020   Procedure: COLONOSCOPY WITH PROPOFOL;  Surgeon: Jackquline Denmark, MD;  Location: [facility] ENDOSCOPY;  Service: Endoscopy;  Laterality: N/A;   COLONOSCOPY WITH PROPOFOL N/A 08/27/2020   Procedure: COLONOSCOPY WITH PROPOFOL;  Surgeon: Lavena Bullion, DO;  Location: [facility] ENDOSCOPY;  Service: Gastroenterology;  Laterality: N/A;   CORONARY ANGIOPLASTY WITH STENT PLACEMENT  09/07/2014   "2"   ENTEROSCOPY N/A 08/27/2020   Procedure: ENTEROSCOPY;  Surgeon: Lavena Bullion, DO;  Location: [facility]  ENDOSCOPY;  Service: Gastroenterology;  Laterality: N/A;  Push enteroscopy    ENTEROSCOPY N/A 06/12/2021   Procedure: ENTEROSCOPY;  Surgeon: Rush Landmark Telford Nab., MD;  Location: [facility] ENDOSCOPY;  Service: Gastroenterology;  Laterality: N/A;   ESOPHAGOGASTRODUODENOSCOPY (EGD) WITH PROPOFOL N/A 08/06/2020   Procedure: ESOPHAGOGASTRODUODENOSCOPY (EGD) WITH PROPOFOL;  Surgeon: Jackquline Denmark, MD;  [facility] ENDOS[facility];  Service: Endoscopy;  Laterality: N/A;   GIVENS CAPSULE STUDY N/A 08/24/2020   Procedure: GIVENS CAPSULE STUDY;  Surgeon: Lavena Bullion, DO;  [facility]: [facility] ENDOSCOPY;  Service: Gastroenterology;  Laterality: N/A;   HOT HEMOSTASIS N/A 08/07/2020   Procedure: HOT HEMOSTASIS (ARGON PLASMA COAGULATION/BICAP);  Surgeon: Jackquline Denmark, MD;  Location: [facility] ENDOSCOPY;  Service: Endoscopy;  Laterality: N/A;   HOT HEMOSTASIS N/A 08/27/2020   Procedure: HOT HEMOSTASIS (ARGON PLASMA COAGULATION/BICAP);  Surgeon: Lavena Bullion, DO;  [facility] ENDOS[facility];  Service: Gastroenterology;  Laterality: N/A;    LEFT HEART CATH AND CORONARY ANGIOGRAPHY N/A 03/25/2018   Procedure: LEFT HEART CATH AND CORONARY ANGIOGRAPHY;  Surgeon: Nigel Mormon, MD;  Location: [facility] CV LAB;  Service: Cardiovascular;  Laterality: N/A;   LEFT HEART CATH AND CORONARY ANGIOGRAPHY N/A 05/09/2021   Procedure: LEFT HEART CATH AND CORONARY ANGIOGRAPHY;  Surgeon: Adrian Prows, MD;  Location: [facility] CV LAB;  Service: Cardiovascular;  Laterality: N/A;   LEFT HEART CATHETERIZATION WITH CORONARY ANGIOGRAM N/A 09/07/2014   Procedure: LEFT HEART CATHETERIZATION WITH CORONARY ANGIOGRAM;  Surgeon: Laverda Page, MD;  Location: [facility] CATH LAB;  Service: Cardiovascular;  Laterality: N/A;   LOWER EXTREMITY ANGIOGRAPHY  10/29/2017   Procedure: Lower Extremity Angiography;  Surgeon: Adrian Prows, MD;  Location: [facility] CV LAB;  Service: Cardiovascular;;   PERIPHERAL VASCULAR CATHETERIZATION N/A 11/01/2015   Procedure: Renal Angiography;  Surgeon: Adrian Prows, MD;  Location: [facility] CV LAB;  Service: Cardiovascular;  Laterality: N/A;   PERIPHERAL VASCULAR CATHETERIZATION N/A 04/10/2016   Procedure: Renal Angiography;  Surgeon: Adrian Prows, MD;  Location: [facility] CV LAB;  Service: Cardiovascular;  Laterality: N/A;   PERIPHERAL VASCULAR CATHETERIZATION  04/10/2016   Procedure: Peripheral Vascular Intervention;  Surgeon: Adrian Prows, MD;  Location: [facility] CV LAB;  Service: Cardiovascular;;   PERIPHERAL VASCULAR INTERVENTION  10/29/2017   Procedure: PERIPHERAL VASCULAR INTERVENTION;  Surgeon: Adrian Prows, MD;  Location: [facility] CV LAB;  Service: Cardiovascular;;   PERIPHERAL VASCULAR INTERVENTION Bilateral 12/28/2020   Procedure: PERIPHERAL VASCULAR INTERVENTION;  Surgeon: Cherre Robins, MD;  Location: [facility] CV LAB;  Service: Cardiovascular;  Laterality: Bilateral;   POLYPECTOMY  08/07/2020   Procedure: POLYPECTOMY;  Surgeon: Jackquline Denmark, MD;  [facility] ENDOS[facility];  Service: Endoscopy;;   POLYPECTOMY   08/27/2020   Procedure: POLYPECTOMY;  Surgeon: Lavena Bullion, DO;  [facility] ENDOS[facility];  Service: Gastroenterology;;   POLYPECTOMY     RENAL ANGIOGRAPHY N/A 10/29/2017   Procedure: RENAL ANGIOGRAPHY;  Surgeon: Adrian Prows, MD;  Location: [facility] CV LAB;  Service: Cardiovascular;  Laterality: N/A;   RENAL ANGIOGRAPHY N/A 05/10/2020   Procedure: RENAL ANGIOGRAPHY;  Surgeon: Nigel Mormon, MD;  Location: [facility] CV LAB;  Service: Cardiovascular;  Laterality: N/A;   RIGHT OOPHORECTOMY Right 10/01/1994   SUBMUCOSAL TATTOO INJECTION  06/12/2021   Procedure: SUBMUCOSAL TATTOO INJECTION;  Surgeon: Irving Copas., MD;  [facility]: [facility] ENDOS[facility];  Service: Gastroenterology;;   TOTAL ABDOMINAL HYSTERECTOMY  10/02/1995   UPPER GASTROINTESTINAL ENDOSCOPY       OB History     Gravida  2  Para      Term      Preterm      AB  1   Living  1      SAB      IAB      Ectopic  1   Multiple      Live Births  1           Family History  Problem Relation Age of Onset   Other Mother        many bowel obstructions   Heart disease Mother    Colon polyps Mother    Kidney cancer Father    Bone cancer Father    Diabetes Father    Heart disease Father    Heart disease Brother    Colon cancer Paternal Grandfather    Diabetes Daughter    Esophageal cancer Neg Hx    Rectal cancer Neg Hx    Stomach cancer Neg Hx     Social History   Tobacco Use   Smoking status: Some Days    Packs/day: 1.50    Years: 40.00    Pack years: 60.00    Types: Cigarettes   Smokeless tobacco: Never  Vaping Use   Vaping Use: Former   Substances: THC  Substance Use Topics   Alcohol use: No   Drug use: Yes    Frequency: 3.0 times per week    Types: Marijuana    Comment: last use yesterday    Home Medications Prior to Admission medications   Medication Sig Start Date End Date Taking? Authorizing Provider  albuterol (PROVENTIL HFA;VENTOLIN HFA) 108 (90 BASE) MCG/ACT  inhaler Inhale 2 puffs into the lungs every 6 (six) hours as needed for wheezing or shortness of breath (asthma).    [provider]  ALPRAZolam Duanne Moron) 1 MG tablet Take 0.5-1 tablets by mouth See admin instructions. Take 1 mg by mouth at bedtime and an additional 0.5-1 mg once a day as needed for anxiety 04/15/17   [provider]  amLODipine (NORVASC) 10 MG tablet TAKE 1 TABLET BY MOUTH ONCE DAILY 04/13/21   Cantwell, Celeste C, PA-C  atorvastatin (LIPITOR) 80 MG tablet Take 80 mg by mouth at bedtime.    [provider]  azelastine (ASTELIN) 0.1 % nasal spray Place 1 spray into both nostrils daily as needed (seasonal allergies). 06/24/19   [provider]  ezetimibe (ZETIA) 10 MG tablet TAKE 1 TABLET BY MOUTH ONCE A DAY 05/03/21   Cantwell, Celeste C, PA-C  feeding supplement (ENSURE ENLIVE / ENSURE PLUS) LIQD Take 237 mLs by mouth daily. Patient taking differently: Take 237 mLs by mouth 2 (two) times daily between meals. 06/13/21   Terrilee Croak, MD  isosorbide mononitrate (IMDUR) 60 MG 24 hr tablet Take 1 tablet (60 mg total) by mouth daily. 06/14/21 09/12/21  Terrilee Croak, MD  labetalol (NORMODYNE) 100 MG tablet Take 100 mg by mouth 2 (two) times daily.    [provider]  labetalol (NORMODYNE) 200 MG tablet Take 1 tablet (200 mg total) by mouth 2 (two) times daily. Patient not taking: Reported on 08/11/2021 06/13/21 09/11/21  Terrilee Croak, MD  LINZESS 72 MCG capsule TAKE 1 CAPSULE BY MOUTH ONCE DAILY BEFORE BREAKFAST 08/03/21   Levin Erp, PA  megestrol (MEGACE) 40 MG tablet Take 40 mg by mouth daily.    [provider]  mometasone-formoterol (DULERA) 100-5 MCG/ACT AERO Take 2 puffs first thing in am and then another 2 puffs about 12 hours  later. 04/26/17   Tanda Rockers, MD  nitroGLYCERIN (NITROSTAT) 0.4 MG SL tablet Place 1 tablet (0.4 mg total) under the tongue every 5 (five) minutes as needed for chest pain. 10/15/19   Miquel Dunn, NP  ondansetron (ZOFRAN-ODT) 4 MG disintegrating tablet Take 1 tablet (4 mg total) by mouth every 8 (eight) hours as needed for nausea or vomiting. 07/25/21   Hayden Rasmussen, MD  oxyCODONE (OXY IR/ROXICODONE) 5 MG immediate release tablet Take 5 mg by mouth in the morning and at bedtime.    [provider]  pantoprazole (PROTONIX) 40 MG tablet TAKE 1 TABLET BY MOUTH TWICE (2) DAILY 06/08/20   Adrian Prows, MD  promethazine (PHENERGAN) 25 MG tablet Take 25 mg by mouth 2 (two) times daily as needed for nausea or vomiting. 08/16/20   [provider]  venlafaxine XR (EFFEXOR XR) 150 MG 24 hr capsule Take 2 capsules (300 mg total) by mouth daily with breakfast. 05/12/21   Arrien, Jimmy Picket, MD    Allergies    Doxycycline, Hydralazine, Aspirin, Hydrocodone, Ibuprofen, Tylenol [acetaminophen], and Iohexol  Review of Systems   Review of Systems  Constitutional:  Positive for fatigue. Negative for activity change, appetite change and fever.  HENT:  Negative for congestion and rhinorrhea.   Respiratory:  Positive for chest tightness and shortness of breath. Negative for cough.   Cardiovascular:  Positive for chest pain.  Gastrointestinal:  Positive for abdominal pain, blood in stool and hematochezia. Negative for nausea and vomiting.  Genitourinary:  Negative for dysuria and hematuria.  Musculoskeletal:  Negative for arthralgias and myalgias.  Skin:  Negative for rash.  Neurological:  Positive for dizziness, weakness and light-headedness. Negative for headaches.   all other systems are negative except as noted in the HPI and PMH.   Physical Exam Updated Vital Signs BP (!) 188/90 (BP Location: Left Arm)   Pulse 92   Temp 98.2 F (36.8 C) (Oral)   Resp 19   SpO2 90%   Physical Exam Vitals and nursing note reviewed.  Constitutional:      General: She is not in acute distress.    Appearance: She is well-developed.  HENT:     Head: Normocephalic and atraumatic.      Mouth/Throat:     Pharynx: No oropharyngeal exudate.  Eyes:     Conjunctiva/sclera: Conjunctivae normal.     Pupils: Pupils are equal, round, and reactive to light.  Neck:     Comments: No meningismus. Cardiovascular:     Rate and Rhythm: Normal rate and regular rhythm.     Heart sounds: Normal heart sounds. No murmur heard. Pulmonary:     Effort: Pulmonary effort is normal. No respiratory distress.     Breath sounds: Wheezing present.  Abdominal:     Palpations: Abdomen is soft.     Tenderness: There is abdominal tenderness. There is no guarding or rebound.     Comments: Diffuse tenderness. No guarding or rebound  Musculoskeletal:        General: No tenderness. Normal range of motion.     Cervical back: Normal range of motion and neck supple.     Comments: Chaperone Micah NT present.  Melena with gross blood present  Skin:    General: Skin is warm.  Neurological:     Mental Status: She is alert and oriented to person, place, and time.     Cranial Nerves: No cranial nerve deficit.     Motor: No abnormal muscle  tone.     Coordination: Coordination normal.     Comments:  5/5 strength throughout. CN 2-12 intact.Equal grip strength.   Psychiatric:        Behavior: Behavior normal.    ED Results / Procedures / Treatments   Labs (all labs ordered are listed, but only abnormal results are displayed) Labs Reviewed  CBC WITH DIFFERENTIAL/PLATELET - Abnormal; Notable for the following components:      Result Value   RBC 3.07 (*)    Hemoglobin 9.4 (*)    HCT 27.4 (*)    RDW 17.4 (*)    Platelets 433 (*)    All other components within normal limits  BASIC METABOLIC PANEL - Abnormal; Notable for the following components:   Sodium 133 (*)    Potassium 3.4 (*)    CO2 21 (*)    Glucose, Bld 100 (*)    BUN 47 (*)    Creatinine, Ser 5.61 (*)    Calcium 8.3 (*)    GFR, Estimated 8 (*)    All other components within normal limits  HEMOGLOBIN AND HEMATOCRIT, BLOOD - Abnormal;  Notable for the following components:   Hemoglobin 8.7 (*)    HCT 25.0 (*)    All other components within normal limits  POC OCCULT BLOOD, ED - Abnormal; Notable for the following components:   Fecal Occult Bld POSITIVE (*)    All other components within normal limits  TROPONIN I (HIGH SENSITIVITY) - Abnormal; Notable for the following components:   Troponin I (High Sensitivity) 21 (*)    All other components within normal limits  PROTIME-INR  LACTIC ACID, PLASMA  LACTIC ACID, PLASMA  POC OCCULT BLOOD, ED  TYPE AND SCREEN  TROPONIN I (HIGH SENSITIVITY)    EKG EKG Interpretation  Date/Time:  Wednesday August 16 2021 00:15:46 EST Ventricular Rate:  82 PR Interval:  111 QRS Duration: 82 QT Interval:  388 QTC Calculation: 454 R Axis:   73 Text Interpretation: Ectopic atrial rhythm Borderline short PR interval Left ventricular hypertrophy Anterior infarct, old No significant change was found Confirmed by Ezequiel Essex [phone]) on 08/16/2021 12:18:27 AM  Radiology CT ABDOMEN PELVIS WO CONTRAST  Result Date: 08/16/2021 CLINICAL DATA:  Acute abdominal pain with rectal bleeding, initial encounter EXAM: CT ABDOMEN AND PELVIS WITHOUT CONTRAST TECHNIQUE: Multidetector CT imaging of the abdomen and pelvis was performed following the standard protocol without IV contrast. COMPARISON:  07/25/2021 FINDINGS: Lower chest: Mild atelectatic changes are noted in the left lung base. Hepatobiliary: No focal liver abnormality is seen. No gallstones, gallbladder wall thickening, or biliary dilatation. Pancreas: Unremarkable. No pancreatic ductal dilatation or surrounding inflammatory changes. Spleen: Normal in size without focal abnormality. Adrenals/Urinary Tract: Adrenal glands are within normal limits. No renal calculi or obstructive changes are seen. The bladder is partially distended. Stomach/Bowel: Mild wall thickening is noted within the colon which may be related to incomplete distension  although the possibility of mild colitis could not be totally excluded given the patient's clinical history. Changes of prior appendectomy are seen. Small bowel and stomach appear within normal limits. Vascular/Lymphatic: Diffuse atherosclerotic calcifications are noted. Kissing iliac stents are seen. No significant lymphadenopathy is noted. Reproductive: Status post hysterectomy. No adnexal masses. Other: No abdominal wall hernia or abnormality. No abdominopelvic ascites. Musculoskeletal: No acute or significant osseous findings. IMPRESSION: Mild wall thickening within the colon as described. This may represent some mild colitis. No other focal abnormality is noted. Electronically Signed   By: Linus Mako.D.  On: 08/16/2021 01:23   DG Chest Portable 1 View  Result Date: 08/15/2021 CLINICAL DATA:  Cough and fever EXAM: PORTABLE CHEST 1 VIEW COMPARISON:  07/25/2021 FINDINGS: Increased opacity in the parahilar right lung. No pleural effusion or pneumothorax. Lungs are hyperinflated. IMPRESSION: Increased opacity in the parahilar right lung, possibly infection. Electronically Signed   By: Ulyses Jarred M.D.   On: 08/15/2021 23:44    Procedures Procedures   Medications Ordered in ED Medications - No data to display  ED Course  I have reviewed the triage vital signs and the nursing notes.  Pertinent labs & imaging results that were available during my care of the patient were reviewed by me and considered in my medical decision making (see chart for details).    MDM Rules/Calculators/A&P                          Acute on chronic rectal bleeding. Vitals stable. No distress.  PAtient with history of previous polyps and AVMs. Hemoglobin today is 9.4, slightly decreased from October.  Vitals remained stable.  Patient has waited 7 hours prior to evaluation so hemoglobin was rechecked which is down trended to 8.7. Hemoccult is positive.  Patient with known history of polyps and AVMs.  With  cough, chest pain and fever, x-ray was obtained which shows perihilar right lung opacity will treat for pneumonia.  Patient with borderline oxygen saturations on room air. X-ray concerning for possible pneumonia.  She was treated with Rocephin and azithromycin  Given worsening rectal bleeding with downtrending hemoglobin would benefit from observation admission overnight and GI evaluation.  Message sent to [facility] gastroenterology. CT scan today concerning for colitis without acute obstruction.  Admission d/w Dr. Marlowe Sax.  Final Clinical Impression(s) / ED Diagnoses Final diagnoses:  Rectal bleeding  Community acquired pneumonia of right lower lobe of lung    Rx / DC Orders ED Discharge Orders     None        Epsie Walthall, Annie Main, MD 08/16/21 [phone]

## 2021-08-15 NOTE — ED Triage Notes (Signed)
Pt comes via EMS with c/o abdominal pain, rectal bleeding, and abnormal labs. Pt went to her MD and was told today that her "blood levels" were abnormal.

## 2021-08-15 NOTE — Telephone Encounter (Signed)
Received notification from [facility] of patient reporting a lot of rectal bleeding and was scheduled to have a CBC drawn today with GI doctor, Dr. Ardis Hughs.   Communication sent to P. Phelps/Dr. Ardis Hughs office regarding patient's symptoms. Per provider blood counts are a bit down from 3 weeks ago but still at a safe level.  Certainly her worsening renal insufficiency plays a role.  Also some rectal bleeding recently.  If she is still having rectal bleeding she needs to go to the ER.  If the rectal bleeding was minor, transient then she is OK for AV fistula tomorrow from my perspective. Nurse spoke with the pt and she does still report bleeding so she will be going to the ED for evaluation.  Spoke with patient also regarding the above recommendations and her plans for further eval at the ED. Patient agreed to delay left arm AVG placement for now and will contact office when ready to reschedule.

## 2021-08-15 NOTE — Progress Notes (Signed)
Anesthesia Chart Review: SAME DAY WORK-UP  Case: [id number] Date/Time: 08/16/21 0815   Procedure: INSERTION OF ARTERIOVENOUS (AV) GORE-TEX GRAFT ARM (Left)   Anesthesia type: Choice   Pre-op diagnosis: ESRD   Location: [facility] OR ROOM 11 / [facility]   Surgeons: Cherre Robins, MD       DISCUSSION: Patient is a 60 year old female scheduled for the above procedure.   History includes smoking, COPD, dyspnea, HTN, HLD, CAD, murmur (mild MR/TR 10/16/19 echo), chronic diastolic CHF, bradycardia, CKD (stage V), chronic mesenteric ischemia (s/p SMA stenting 12/28/20), PAD (distal aorta stent 10/29/17; terminal aortic & bilateral CIA stenting 12/01/20), renal artery stenosis (s/p bilateral renal artery PTA/stenting 09/07/14; left RA angioplasty for ISR 11/01/15 & 04/10/16; left RA angioplasty, antegrade dissection in distal left RA with decreased distal flow too distal for stenting 05/10/20), GERD, IBS, anemia (with acute GI bleed 08/2020, s/p single non-bleeding colonic angiodysplastic lesion s/p APC 08/07/20, multiple non-bleeding AVM 08/24/20 capsule study; 06/2020, possible diverticular bleed versus ischemic etiology), diverticulitis (s/p sigmoid colectomy, appendectomy 03/03/07), pancreatitis (2013), glaucoma.   Last GI visit with Dr. Ardis Hughs on 08/01/21 for follow-up chronic abdominal discomfort, mesenteric ischemia s/p SMA stent 12/28/20), and difficulty with bowel movements/constipation. He noted she is on chronic narcotic and smelled of marijuana. Acute on chronic GI bleed in setting of hypertensive urgency 06/2021. S/p 2 units PRBC for HGB 6.3. S/p IV Feraheme 06/12/21. Stable HGB 10.2 on 07/25/21. Multiple GI procedures over the past year (see below OTHER). Abdominal and rectal exam unremarkable. He added fiber supplement/Citrucel to Linzess regiment. 3 month follow-up planned.    Last cardiology evaluation by Dr. Einar Gip on 06/12/21 during admission GI bleed, acute on chronic and hypertensive urgency. He wrote, "Patient with  known severe peripheral artery disease and ongoing tobacco use disorder admitted with GI bleed.  Etiology for GI bleed is probably related to small vessel disease and probably chronic intestinal ischemia... Would not recommend restarting DAPT for now.  Not a candidate for repeat angiography as she will not be a candidate for any antithrombotic agents or antiplatelet agent therapy." If recurrent bleed, could consider further GI work-up. Labetalol increased for HTN.   She had CBC on 08/15/21 at [facility] due to report of dark stool x2. H/H 9.4/27.2, previously 10.2/29.9 on 07/25/21 and 9.8/30.0 on 06/13/21. This was reviewed by Dr. Ardis Hughs who wrote, " Her blood counts are a bit down from 3 weeks ago but still at a safe level.  Certainly her worsening renal insufficiency plays a role.  Also some rectal bleeding recently.  If she is still having rectal bleeding she needs to go to the ER.  If the rectal bleeding was minor, transient then she is OK for AV fistula tomorrow from my perspective." Patient responded that she was still bleeding, so is going to go to the ED. GI sent information to Dr. Stanford Breed.    VS:  BP Readings from Last 3 Encounters:  08/08/21 (!) 141/76  08/01/21 (!) 126/58  07/25/21 (!) 192/84   Pulse Readings from Last 3 Encounters:  08/08/21 74  08/01/21 88  07/25/21 79     PROVIDERS: Bernerd Limbo, MD is PCP Adrian Prows, MD is cardiologist Owens Loffler, MD is GI Trinda Pascal, MD is nephrologist [facility]), but as of 07/20/21 was going to re-establish at [facility] [place], [place]. [place], MD) so she could have hemodialysis in [place] once needed.   LABS: Currently, last lab results include: Lab Results  Component Value Date  WBC 8.7 08/15/2021   HGB 9.4 (L) 08/15/2021   HCT 27.2 (L) 08/15/2021   PLT 509.0 (H) 08/15/2021   GLUCOSE 98 07/25/2021   ALT 11 07/25/2021   AST 15 07/25/2021   NA 134 (L) 07/25/2021   K 2.9 (L) 07/25/2021   CL 103 07/25/2021    CREATININE 5.17 (H) 07/25/2021   BUN 35 (H) 07/25/2021   CO2 21 (L) 07/25/2021   INR 0.9 06/10/2021     OTHER: Small bowel enteroscopy 06/12/21 (Mansouraty, Telford Nab, MD): IMPRESSION: - No gross lesions in esophagus. - Non-obstructing Schatzki ring. - 3 cm hiatal hernia. - No gross lesions in the stomach. - Normal mucosa was found in the entire examined duodenum. - Normal mucosa was found in the proximal jejunum. Tattooed distal extent. - I suspect bleeding could have been diverticular in origin vs ischemic in origin (hard to say without ability to pursue IV contrast at time of last colonoscopy). Not clear that there is benefit with repeat colonoscopy at this time, while patient has not passed a BM in 24 hours per her report, so will not plan as it has been done within the last 6-weeks (with good preparation). When she starts having bowel movements, I suspect that the water will become blood-tinged from what I saw today. I am not worried about this unless there is a concomittant change in hemodynamics, severe and rapid bleeding is noted per rectum, with annotated decrease in Hgb as well.  Colonoscopy 04/12/21: IMPRESSION: - One 8 mm polyp in the sigmoid colon, removed with a cold snare. Resected and retrieved. (Pathology: Hyperplastic polyp. No adenomatous change or carcinoma.) - Diverticulosis in the left colon. - The examination was otherwise normal on direct and retroflexion views.  Capsule Endoscopy 08/24/20: SUMMARY: 1.  2 small, nonbleeding AVMs scattered in the very proximal duodenum x1 and mid small bowel x1.  The proximal small bowel AVM is within reach of endoscope, and the second mid small bowel is potentially within reach of enteroscopy. 2.  Small, nonbleeding AVM in the proximal colon along with either old blood or just red particulate matter in the colon.  This could be further evaluated and treated with colonoscopy. 3.  Small area of focal gastritis versus gastric  erosions, not likely of clinical consequence.  EGD 08/06/20: IMPRESSION: Food (residue) in the stomach without outlet obstruction. Otherwise normal EGD.  No UGI bleeding.  Duodenal biopsy taken for histology to rule out celiac disease. (Pathology: Duodenal mucosa with no significant pathologic findings.  Negative for increased intraepithelial lymphocytes and villous architectural changes.)   IMAGES: 1V PCXR 07/25/21: FINDINGS: Stable chronic hyperinflation. Heart and mediastinal contours are within normal limits. No focal opacities or effusions. No acute bony abnormality. Old right clavicle fracture. IMPRESSION: No active cardiopulmonary disease.   CT Abd/pelvis 07/25/21: IMPRESSION: 1. Apparent wall thickening of the descending and sigmoid colon although limited evaluation due to nondistention. Correlate for possible colitis. 2. Chronic left renal atrophy. 3. Stable small amount of free fluid in the dependent pelvis. 4. Compressive atelectatic changes in the medial right middle and lower lobe, new since previous study. 5. Severe atherosclerotic disease noted.    EKG: 07/25/21: Ectopic atrial rhythm Borderline short PR interval Left ventricular hypertrophy Nonspecific T abnormalities, lateral leads No significant change since prior 9/22 Confirmed by Aletta Edouard [phone]) on 07/25/2021 8:40:15 PM   CV: Lexiscan Tetrofosmin stress test 05/22/2021: Carlton Adam nuclear stress test performed using 1-day protocol. Patient was hypertensive throughout the study up to 200/100 mmHg.  Stress EKG is non-diagnostic, as this is pharmacological stress test. In addition, rest and stress EKG showed sinus rhythm, left ventricular hypertrophy, nondiagnostic lateral ST depression <1 mm. Normal myocardial perfusion. Stress LVEF 50%. Low risk study.   Left Heart Catheterization 05/09/21:  LV: 108/2, EDP 12 mmHg.  Ao 111/58, mean 106 mmHg.  No pressure gradient across the aortic valve.  LVEDP is  normal.  LV gram not performed to conserve contrast. LM: Large-caliber vessel, smooth and normal. LAD: Very large caliber vessel, gives origin to a large D1 and several small diagonals.  The LAD has mild proximal coronary calcification without luminal obstruction.  The LAD and diagonal are tortuous. Circumflex: There is minimal ectasia noted in the midsegment otherwise smooth and normal.  Gives origin to a moderate-sized OM1 and a large OM 2. RCA: [place] has a 30% calcific stenosis.  No damping of pressure in spite of the catheter being placed deep inside the artery.  Otherwise RCA smooth and mildly tortuous. - Impression: Abnormal EKG and positive cardiac markers is due to demand ischemia from hypertensive urgency.Marland KitchenMarland KitchenFrom cardiac standpoint she can come off of Plavix as she is now having significant GI issues.  If abdominal discomfort persist, she may need GI consult.   Renal artery duplex 05/09/2021: Summary:  Renal:  Right: Normal size right kidney. Mildly elevated RI No evidence of         right renal artery stenosis. RRV flow present.  Left:  Left renal artery appears occluded. No perfusion observed to         left kidney. Abnormal size for the left kidney. Abnormal         cortical thickness of the left kidney. Atrophic appearance.  Mesenteric:  Stented SMA appears patent. Velocities appear essentially unchanged  compared to  previous examination on 04-11-2021. Celiac artery velocities consistent  with  previously identified 50% CA stenosis on 12-28-2020.    Abdominal Aortogram 12/28/2020 Stanford Breed, Marcello Moores, MD): OPERATIVE FINDINGS:  Aortogram reveals prior terminal aortic stenting prior renal stenting CO2 aortography performed for majority of diagnostic studies 70% SMA stenosis. 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 95% right common iliac stenosis Unremarkable SMA stenting. Unable to track a stent into the celiac artery.  I elected to not continue because of the risk of contrast nephropathy. Right  common iliac stenosis stented.  Post stenting we noted perforation with extravasation in the right common iliac artery as well as ulceration in the aortic stent. I elected to cover all of these lesions with terminal aortic stenting and bilateral common iliac artery stenting. Angiogram shows excellent flow through the terminal aorta and through the aortic bifurcation.   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 Stenosis in the  right external carotid artery (<50%).  Stenosis in the left internal carotid artery (16-49%). Stenosis in the  left external carotid artery (<50%).  Antegrade right vertebral artery flow. Antegrade left vertebral artery  flow.  Follow up in 12 months is appropriate if clinically indicated.  Compared to the study done on 04/08/2020, left ICA stenosis was in the  range of 50-69%., now <50%.   Echocardiogram 10/16/2019:  Normal LV systolic function with EF 60%. Left ventricle cavity is normal  in size. Normal global wall motion. Normal diastolic filling pattern.  Calculated EF 60%.  Left atrial cavity is mildly dilated in 4 chamber views.  Structurally normal mitral valve.  Mild (Grade I) mitral regurgitation.  Structurally normal tricuspid valve.  Mild tricuspid regurgitation. No  evidence of pulmonary hypertension.  Compared to 02/25/2017, no significant change.    Past Medical History:  Diagnosis Date   Anemia 2008   Anginal pain ([place])    Anxiety    Aortic stenosis    Arthritis    "all my joints ache" (09/07/2014)   Asthma    Bipolar 1 disorder ([place])    Blood transfusion without reported diagnosis    Bradycardia    Bruit    Carpal tunnel syndrome    Cataract    forming   Cervical cancer ([place]) 1985   CHF (congestive heart failure) ([place])    Chronic kidney disease (CKD), stage V (HCC)    COPD (chronic obstructive pulmonary disease) ([place]) 2000   Coronary artery disease    Daily headache    Depression  2000   Diverticulitis 2008   Emphysema of lung (HCC)    GERD (gastroesophageal reflux disease)    Glaucoma    Heart murmur    History of colon polyps 2009   HLD (hyperlipidemia) 2013   Hypertension 2013   Hypovitaminosis D    IBS (irritable bowel syndrome) 2008   Myocardial infarction [facility])    2015   PAD (peripheral artery disease) ([place])    Pancreatitis 10/2011   Pneumonia 07/2014   RLS (restless legs syndrome)    Schizophrenia (HCC)    Shortness of breath    Skin cancer    Small bowel obstruction ([place]) 2008   Thyroid disease     Past Surgical History:  Procedure Laterality Date   ABDOMINAL ANGIOGRAM N/A 09/07/2014   Procedure: ABDOMINAL ANGIOGRAM;  Surgeon: Laverda Page, MD;  Location: [facility] CATH LAB;  Service: Cardiovascular;  Laterality: N/A;   ABDOMINAL AORTOGRAM W/LOWER EXTREMITY N/A 12/28/2020   Procedure: ABDOMINAL AORTOGRAM W/LOWER EXTREMITY;  Surgeon: Cherre Robins, MD;  Location: [place] CV LAB;  Service: Cardiovascular;  Laterality: N/A;   APPENDECTOMY  10/01/1994   BIOPSY  08/06/2020   Procedure: BIOPSY;  Surgeon: Jackquline Denmark, MD;  Location: [facility] ENDOSCOPY;  Service: Endoscopy;;   COLON SURGERY  10/01/2006   6 inches of colon removed due to obstruction   COLONOSCOPY WITH PROPOFOL N/A 10/25/2016   Procedure: COLONOSCOPY WITH PROPOFOL;  Surgeon: Milus Banister, MD;  Location: [facility] ENDOSCOPY;  Service: Endoscopy;  Laterality: N/A;   COLONOSCOPY WITH PROPOFOL N/A 08/07/2020   Procedure: COLONOSCOPY WITH PROPOFOL;  Surgeon: Jackquline Denmark, MD;  Location: [facility] ENDOS[facility];  Service: Endoscopy;  Laterality: N/A;   COLONOSCOPY WITH PROPOFOL N/A 08/27/2020   Procedure: COLONOSCOPY WITH PROPOFOL;  Surgeon: Lavena Bullion, DO;  Location: [facility] ENDOS[facility];  Service: Gastroenterology;  Laterality: N/A;   CORONARY ANGIOPLASTY WITH STENT PLACEMENT  09/07/2014   "2"   ENTEROSCOPY N/A 08/27/2020   Procedure: ENTEROSCOPY;  Surgeon: Lavena Bullion, DO;  Location: [facility]  ENDOSCOPY;  Service: Gastroenterology;  Laterality: N/A;  Push enteroscopy    ENTEROSCOPY N/A 06/12/2021   Procedure: ENTEROSCOPY;  Surgeon: Rush Landmark Telford Nab., MD;  Location: [facility] END[facility];  Service: Gastroenterology;  Laterality: N/A;   ESOPHAGOGASTRODUODENOSCOPY (EGD) WITH PROPOFOL N/A 08/06/2020   Procedure: ESOPHAGOGASTRODUODENOSCOPY (EGD) WITH PROPOFOL;  Surgeon: Jackquline Denmark, MD;  [facility]: [facility];  Service: Endoscopy;  Laterality: N/A;   GIVENS CAPSULE STUDY N/A 08/24/2020   Procedure: GIVENS CAPSULE STUDY;  Surgeon: Lavena Bullion, DO;  Location: [facility];  Service: Gastroenterology;  Laterality: N/A;   HOT HEMOSTASIS N/A 08/07/2020   Procedure: HOT HEMOSTASIS (ARGON PLASMA COAGULATION/BICAP);  Surgeon: Jackquline Denmark, MD;  Location:  [facility] END[facility];  Service: Endoscopy;  Laterality: N/A;   HOT HEMOSTASIS N/A 08/27/2020   Procedure: HOT HEMOSTASIS (ARGON PLASMA COAGULATION/BICAP);  Surgeon: Lavena Bullion, DO;  [facility]: [facility];  Service: Gastroenterology;  Laterality: N/A;   LEFT HEART CATH AND CORONARY ANGIOGRAPHY N/A 03/25/2018   Procedure: LEFT HEART CATH AND CORONARY ANGIOGRAPHY;  Surgeon: Nigel Mormon, MD;  Location: [place] CV LAB;  Service: Cardiovascular;  Laterality: N/A;   LEFT HEART CATH AND CORONARY ANGIOGRAPHY N/A 05/09/2021   Procedure: LEFT HEART CATH AND CORONARY ANGIOGRAPHY;  Surgeon: Adrian Prows, MD;  Location: [place] CV LAB;  Service: Cardiovascular;  Laterality: N/A;   LEFT HEART CATHETERIZATION WITH CORONARY ANGIOGRAM N/A 09/07/2014   Procedure: LEFT HEART CATHETERIZATION WITH CORONARY ANGIOGRAM;  Surgeon: Laverda Page, MD;  Location: [facility] CATH LAB;  Service: Cardiovascular;  Laterality: N/A;   LOWER EXTREMITY ANGIOGRAPHY  10/29/2017   Procedure: Lower Extremity Angiography;  Surgeon: Adrian Prows, MD;  Location: [place] CV LAB;  Service: Cardiovascular;;   PERIPHERAL VASCULAR CATHETERIZATION N/A 11/01/2015   Procedure:  Renal Angiography;  Surgeon: Adrian Prows, MD;  Location: [place] CV LAB;  Service: Cardiovascular;  Laterality: N/A;   PERIPHERAL VASCULAR CATHETERIZATION N/A 04/10/2016   Procedure: Renal Angiography;  Surgeon: Adrian Prows, MD;  Location: [place] CV LAB;  Service: Cardiovascular;  Laterality: N/A;   PERIPHERAL VASCULAR CATHETERIZATION  04/10/2016   Procedure: Peripheral Vascular Intervention;  Surgeon: Adrian Prows, MD;  Location: [place] CV LAB;  Service: Cardiovascular;;   PERIPHERAL VASCULAR INTERVENTION  10/29/2017   Procedure: PERIPHERAL VASCULAR INTERVENTION;  Surgeon: Adrian Prows, MD;  Location: [place] CV LAB;  Service: Cardiovascular;;   PERIPHERAL VASCULAR INTERVENTION Bilateral 12/28/2020   Procedure: PERIPHERAL VASCULAR INTERVENTION;  Surgeon: Cherre Robins, MD;  Location: [place] CV LAB;  Service: Cardiovascular;  Laterality: Bilateral;   POLYPECTOMY  08/07/2020   Procedure: POLYPECTOMY;  Surgeon: Jackquline Denmark, MD;  [facility];  Service: Endoscopy;;   POLYPECTOMY  08/27/2020   Procedure: POLYPECTOMY;  Surgeon: Lavena Bullion, DO;  [facility]: [facility];  Service: Gastroenterology;;   POLYPECTOMY     RENAL ANGIOGRAPHY N/A 10/29/2017   Procedure: RENAL ANGIOGRAPHY;  Surgeon: Adrian Prows, MD;  Location: [place] CV LAB;  Service: Cardiovascular;  Laterality: N/A;   RENAL ANGIOGRAPHY N/A 05/10/2020   Procedure: RENAL ANGIOGRAPHY;  Surgeon: Nigel Mormon, MD;  Location: [place] CV LAB;  Service: Cardiovascular;  Laterality: N/A;   RIGHT OOPHORECTOMY Right 10/01/1994   SUBMUCOSAL TATTOO INJECTION  06/12/2021   Procedure: SUBMUCOSAL TATTOO INJECTION;  Surgeon: Irving Copas., MD;  [facility]: [facility];  Service: Gastroenterology;;   TOTAL ABDOMINAL HYSTERECTOMY  10/02/1995   UPPER GASTROINTESTINAL ENDOSCOPY      MEDICATIONS: No current facility-administered medications for this encounter.    albuterol (PROVENTIL HFA;VENTOLIN  HFA) 108 (90 BASE) MCG/ACT inhaler   ALPRAZolam (XANAX) 1 MG tablet   amLODipine (NORVASC) 10 MG tablet   atorvastatin (LIPITOR) 80 MG tablet   azelastine (ASTELIN) 0.1 % nasal spray   ezetimibe (ZETIA) 10 MG tablet   feeding supplement (ENSURE ENLIVE / ENSURE PLUS) LIQD   isosorbide mononitrate (IMDUR) 60 MG 24 hr tablet   labetalol (NORMODYNE) 100 MG tablet   LINZESS 72 MCG capsule   megestrol (MEGACE) 40 MG tablet   mometasone-formoterol (DULERA) 100-5 MCG/ACT AERO   nitroGLYCERIN (NITROSTAT) 0.4 MG SL tablet   ondansetron (ZOFRAN-ODT) 4 MG disintegrating tablet   oxyCODONE (OXY IR/ROXICODONE) 5 MG immediate  release tablet   pantoprazole (PROTONIX) 40 MG tablet   promethazine (PHENERGAN) 25 MG tablet   venlafaxine XR (EFFEXOR XR) 150 MG 24 hr capsule   labetalol (NORMODYNE) 200 MG tablet    Myra Gianotti, PA-C Surgical Short Stay/Anesthesiology [facility] Phone [phone] [facility] Phone [phone] 08/15/2021 1:10 PM

## 2021-08-16 ENCOUNTER — Ambulatory Visit (HOSPITAL_COMMUNITY): Admission: RE | Admit: 2021-08-16 | Payer: Medicare Other | Source: Home / Self Care | Admitting: Vascular Surgery

## 2021-08-16 ENCOUNTER — Other Ambulatory Visit: Payer: Self-pay

## 2021-08-16 ENCOUNTER — Inpatient Hospital Stay (HOSPITAL_COMMUNITY): Payer: Medicare Other

## 2021-08-16 ENCOUNTER — Encounter (HOSPITAL_COMMUNITY): Admission: RE | Payer: Self-pay | Source: Home / Self Care

## 2021-08-16 ENCOUNTER — Emergency Department (HOSPITAL_COMMUNITY): Payer: Medicare Other

## 2021-08-16 DIAGNOSIS — J439 Emphysema, unspecified: Secondary | ICD-10-CM | POA: Diagnosis present

## 2021-08-16 DIAGNOSIS — J9601 Acute respiratory failure with hypoxia: Secondary | ICD-10-CM | POA: Diagnosis present

## 2021-08-16 DIAGNOSIS — K5521 Angiodysplasia of colon with hemorrhage: Secondary | ICD-10-CM | POA: Diagnosis present

## 2021-08-16 DIAGNOSIS — I5032 Chronic diastolic (congestive) heart failure: Secondary | ICD-10-CM | POA: Diagnosis present

## 2021-08-16 DIAGNOSIS — K551 Chronic vascular disorders of intestine: Secondary | ICD-10-CM | POA: Diagnosis present

## 2021-08-16 DIAGNOSIS — N179 Acute kidney failure, unspecified: Secondary | ICD-10-CM | POA: Diagnosis present

## 2021-08-16 DIAGNOSIS — F319 Bipolar disorder, unspecified: Secondary | ICD-10-CM | POA: Diagnosis present

## 2021-08-16 DIAGNOSIS — H409 Unspecified glaucoma: Secondary | ICD-10-CM | POA: Diagnosis present

## 2021-08-16 DIAGNOSIS — D62 Acute posthemorrhagic anemia: Secondary | ICD-10-CM | POA: Diagnosis present

## 2021-08-16 DIAGNOSIS — E785 Hyperlipidemia, unspecified: Secondary | ICD-10-CM | POA: Diagnosis present

## 2021-08-16 DIAGNOSIS — K921 Melena: Secondary | ICD-10-CM | POA: Diagnosis not present

## 2021-08-16 DIAGNOSIS — M159 Polyosteoarthritis, unspecified: Secondary | ICD-10-CM | POA: Diagnosis present

## 2021-08-16 DIAGNOSIS — Z20822 Contact with and (suspected) exposure to covid-19: Secondary | ICD-10-CM | POA: Diagnosis present

## 2021-08-16 DIAGNOSIS — D649 Anemia, unspecified: Secondary | ICD-10-CM | POA: Diagnosis not present

## 2021-08-16 DIAGNOSIS — I2510 Atherosclerotic heart disease of native coronary artery without angina pectoris: Secondary | ICD-10-CM | POA: Diagnosis present

## 2021-08-16 DIAGNOSIS — J189 Pneumonia, unspecified organism: Secondary | ICD-10-CM | POA: Diagnosis not present

## 2021-08-16 DIAGNOSIS — K625 Hemorrhage of anus and rectum: Secondary | ICD-10-CM | POA: Diagnosis present

## 2021-08-16 DIAGNOSIS — I132 Hypertensive heart and chronic kidney disease with heart failure and with stage 5 chronic kidney disease, or end stage renal disease: Secondary | ICD-10-CM | POA: Diagnosis present

## 2021-08-16 DIAGNOSIS — K5730 Diverticulosis of large intestine without perforation or abscess without bleeding: Secondary | ICD-10-CM | POA: Diagnosis present

## 2021-08-16 DIAGNOSIS — K922 Gastrointestinal hemorrhage, unspecified: Secondary | ICD-10-CM

## 2021-08-16 DIAGNOSIS — E869 Volume depletion, unspecified: Secondary | ICD-10-CM | POA: Diagnosis present

## 2021-08-16 DIAGNOSIS — F419 Anxiety disorder, unspecified: Secondary | ICD-10-CM | POA: Diagnosis present

## 2021-08-16 DIAGNOSIS — G2581 Restless legs syndrome: Secondary | ICD-10-CM | POA: Diagnosis present

## 2021-08-16 DIAGNOSIS — J159 Unspecified bacterial pneumonia: Secondary | ICD-10-CM | POA: Diagnosis present

## 2021-08-16 DIAGNOSIS — K219 Gastro-esophageal reflux disease without esophagitis: Secondary | ICD-10-CM | POA: Diagnosis present

## 2021-08-16 DIAGNOSIS — I739 Peripheral vascular disease, unspecified: Secondary | ICD-10-CM | POA: Diagnosis present

## 2021-08-16 DIAGNOSIS — R195 Other fecal abnormalities: Secondary | ICD-10-CM | POA: Diagnosis not present

## 2021-08-16 DIAGNOSIS — F209 Schizophrenia, unspecified: Secondary | ICD-10-CM | POA: Diagnosis present

## 2021-08-16 DIAGNOSIS — E871 Hypo-osmolality and hyponatremia: Secondary | ICD-10-CM | POA: Diagnosis present

## 2021-08-16 DIAGNOSIS — N186 End stage renal disease: Secondary | ICD-10-CM | POA: Diagnosis present

## 2021-08-16 LAB — IRON AND TIBC
Iron: 61 ug/dL (ref 28–170)
Saturation Ratios: 26 % (ref 10.4–31.8)
TIBC: 233 ug/dL — ABNORMAL LOW (ref 250–450)
UIBC: 172 ug/dL

## 2021-08-16 LAB — HEMOGLOBIN AND HEMATOCRIT, BLOOD
HCT: 25 % — ABNORMAL LOW (ref 36.0–46.0)
HCT: 25.2 % — ABNORMAL LOW (ref 36.0–46.0)
HCT: 25.9 % — ABNORMAL LOW (ref 36.0–46.0)
Hemoglobin: 8.5 g/dL — ABNORMAL LOW (ref 12.0–15.0)
Hemoglobin: 8.7 g/dL — ABNORMAL LOW (ref 12.0–15.0)
Hemoglobin: 9.1 g/dL — ABNORMAL LOW (ref 12.0–15.0)

## 2021-08-16 LAB — BASIC METABOLIC PANEL
Anion gap: 13 (ref 5–15)
BUN: 44 mg/dL — ABNORMAL HIGH (ref 6–20)
CO2: 20 mmol/L — ABNORMAL LOW (ref 22–32)
Calcium: 8.3 mg/dL — ABNORMAL LOW (ref 8.9–10.3)
Chloride: 99 mmol/L (ref 98–111)
Creatinine, Ser: 5.43 mg/dL — ABNORMAL HIGH (ref 0.44–1.00)
GFR, Estimated: 9 mL/min — ABNORMAL LOW (ref >60)
Glucose, Bld: 88 mg/dL (ref 70–99)
Potassium: 3.5 mmol/L (ref 3.5–5.1)
Sodium: 132 mmol/L — ABNORMAL LOW (ref 135–145)

## 2021-08-16 LAB — RESP PANEL BY RT-PCR (FLU A&B, COVID) ARPGX2
Influenza A by PCR: NEGATIVE
Influenza B by PCR: NEGATIVE
SARS Coronavirus 2 by RT PCR: NEGATIVE

## 2021-08-16 LAB — TROPONIN I (HIGH SENSITIVITY)
Troponin I (High Sensitivity): 21 ng/L — ABNORMAL HIGH (ref <18)
Troponin I (High Sensitivity): 22 ng/L — ABNORMAL HIGH (ref <18)

## 2021-08-16 LAB — FERRITIN: Ferritin: 161 ng/mL (ref 11–307)

## 2021-08-16 LAB — PREPARE RBC (CROSSMATCH)

## 2021-08-16 LAB — BRAIN NATRIURETIC PEPTIDE: B Natriuretic Peptide: 605.4 pg/mL — ABNORMAL HIGH (ref 0.0–100.0)

## 2021-08-16 LAB — PROTIME-INR
INR: 0.9 (ref 0.8–1.2)
Prothrombin Time: 12.4 seconds (ref 11.4–15.2)

## 2021-08-16 LAB — LACTIC ACID, PLASMA: Lactic Acid, Venous: 0.5 mmol/L (ref 0.5–1.9)

## 2021-08-16 SURGERY — INSERTION OF ARTERIOVENOUS (AV) GORE-TEX GRAFT ARM
Anesthesia: Choice | Laterality: Left

## 2021-08-16 MED ORDER — CALCIUM ACETATE (PHOS BINDER) 667 MG PO CAPS
667.0000 mg | ORAL_CAPSULE | Freq: Every day | ORAL | Status: DC | PRN
  Filled 2021-08-16: qty 1

## 2021-08-16 MED ORDER — ONDANSETRON HCL 4 MG/2ML IJ SOLN
4.0000 mg | Freq: Once | INTRAMUSCULAR | Status: AC
  Administered 2021-08-16: 4 mg via INTRAVENOUS
  Filled 2021-08-16: qty 2

## 2021-08-16 MED ORDER — VENLAFAXINE HCL ER 150 MG PO CP24
300.0000 mg | ORAL_CAPSULE | Freq: Every day | ORAL | Status: DC
  Administered 2021-08-16 – 2021-08-22 (×6): 300 mg via ORAL
  Filled 2021-08-16 (×6): qty 2
  Filled 2021-08-16: qty 4
  Filled 2021-08-16: qty 2

## 2021-08-16 MED ORDER — SODIUM CHLORIDE 0.9 % IV SOLN
500.0000 mg | INTRAVENOUS | Status: DC
  Administered 2021-08-16 – 2021-08-19 (×3): 500 mg via INTRAVENOUS
  Filled 2021-08-16 (×4): qty 500

## 2021-08-16 MED ORDER — FENTANYL CITRATE PF 50 MCG/ML IJ SOSY
50.0000 ug | PREFILLED_SYRINGE | Freq: Once | INTRAMUSCULAR | Status: AC
  Administered 2021-08-16: 50 ug via INTRAVENOUS
  Filled 2021-08-16: qty 1

## 2021-08-16 MED ORDER — IPRATROPIUM-ALBUTEROL 0.5-2.5 (3) MG/3ML IN SOLN
3.0000 mL | Freq: Three times a day (TID) | RESPIRATORY_TRACT | Status: DC
  Administered 2021-08-17 – 2021-08-22 (×14): 3 mL via RESPIRATORY_TRACT
  Filled 2021-08-16 (×13): qty 3

## 2021-08-16 MED ORDER — SODIUM CHLORIDE 0.9 % IV SOLN
500.0000 mg | Freq: Once | INTRAVENOUS | Status: AC
  Administered 2021-08-16: 500 mg via INTRAVENOUS
  Filled 2021-08-16: qty 500

## 2021-08-16 MED ORDER — PANTOPRAZOLE SODIUM 40 MG IV SOLR
40.0000 mg | Freq: Once | INTRAVENOUS | Status: AC
  Administered 2021-08-16: 40 mg via INTRAVENOUS
  Filled 2021-08-16: qty 40

## 2021-08-16 MED ORDER — BUDESONIDE 0.25 MG/2ML IN SUSP
0.2500 mg | Freq: Two times a day (BID) | RESPIRATORY_TRACT | Status: DC
  Administered 2021-08-16 – 2021-08-22 (×13): 0.25 mg via RESPIRATORY_TRACT
  Filled 2021-08-16 (×14): qty 2

## 2021-08-16 MED ORDER — IPRATROPIUM-ALBUTEROL 0.5-2.5 (3) MG/3ML IN SOLN
3.0000 mL | Freq: Four times a day (QID) | RESPIRATORY_TRACT | Status: DC
  Administered 2021-08-16 (×3): 3 mL via RESPIRATORY_TRACT
  Filled 2021-08-16 (×3): qty 3

## 2021-08-16 MED ORDER — ATORVASTATIN CALCIUM 80 MG PO TABS
80.0000 mg | ORAL_TABLET | Freq: Every day | ORAL | Status: DC
  Administered 2021-08-16 – 2021-08-21 (×6): 80 mg via ORAL
  Filled 2021-08-16 (×2): qty 1
  Filled 2021-08-16: qty 2
  Filled 2021-08-16: qty 1
  Filled 2021-08-16: qty 2
  Filled 2021-08-16: qty 1

## 2021-08-16 MED ORDER — CALCIUM ACETATE (PHOS BINDER) 667 MG PO CAPS
2001.0000 mg | ORAL_CAPSULE | Freq: Three times a day (TID) | ORAL | Status: DC
  Administered 2021-08-17 – 2021-08-22 (×14): 2001 mg via ORAL
  Filled 2021-08-16 (×20): qty 3

## 2021-08-16 MED ORDER — ALPRAZOLAM 0.5 MG PO TABS
1.0000 mg | ORAL_TABLET | Freq: Every day | ORAL | Status: DC
  Administered 2021-08-16 – 2021-08-21 (×6): 1 mg via ORAL
  Filled 2021-08-16: qty 2
  Filled 2021-08-16 (×2): qty 1
  Filled 2021-08-16 (×3): qty 2

## 2021-08-16 MED ORDER — METRONIDAZOLE 500 MG/100ML IV SOLN
500.0000 mg | Freq: Three times a day (TID) | INTRAVENOUS | Status: DC
  Administered 2021-08-16 – 2021-08-19 (×9): 500 mg via INTRAVENOUS
  Filled 2021-08-16 (×9): qty 100

## 2021-08-16 MED ORDER — EZETIMIBE 10 MG PO TABS
10.0000 mg | ORAL_TABLET | Freq: Every day | ORAL | Status: DC
  Administered 2021-08-16 – 2021-08-22 (×7): 10 mg via ORAL
  Filled 2021-08-16 (×7): qty 1

## 2021-08-16 MED ORDER — OXYCODONE HCL 5 MG PO TABS
5.0000 mg | ORAL_TABLET | Freq: Four times a day (QID) | ORAL | Status: DC | PRN
  Administered 2021-08-16 – 2021-08-21 (×14): 5 mg via ORAL
  Filled 2021-08-16 (×15): qty 1

## 2021-08-16 MED ORDER — ALPRAZOLAM 0.5 MG PO TABS
0.5000 mg | ORAL_TABLET | Freq: Every day | ORAL | Status: DC | PRN
Start: 2021-08-16 — End: 2021-08-22
  Administered 2021-08-16 – 2021-08-17 (×2): 1 mg via ORAL
  Filled 2021-08-16 (×2): qty 2

## 2021-08-16 MED ORDER — SODIUM CHLORIDE 0.9 % IV SOLN
1.0000 g | Freq: Once | INTRAVENOUS | Status: AC
  Administered 2021-08-16: 1 g via INTRAVENOUS
  Filled 2021-08-16: qty 10

## 2021-08-16 MED ORDER — ALBUTEROL SULFATE (2.5 MG/3ML) 0.083% IN NEBU: 2.5000 mg | INHALATION_SOLUTION | Freq: Four times a day (QID) | RESPIRATORY_TRACT | Status: DC | PRN

## 2021-08-16 MED ORDER — SODIUM CHLORIDE 0.9 % IV SOLN
1.0000 g | INTRAVENOUS | Status: AC
  Administered 2021-08-16 – 2021-08-20 (×5): 1 g via INTRAVENOUS
  Filled 2021-08-16 (×6): qty 10

## 2021-08-16 MED ORDER — SODIUM CHLORIDE 0.9% IV SOLUTION: Freq: Once | INTRAVENOUS | Status: DC

## 2021-08-16 MED ORDER — ALUM & MAG HYDROXIDE-SIMETH 200-200-20 MG/5ML PO SUSP
30.0000 mL | Freq: Once | ORAL | Status: AC
  Administered 2021-08-16: 30 mL via ORAL
  Filled 2021-08-16: qty 30

## 2021-08-16 MED ORDER — SODIUM CHLORIDE 0.9 % IV SOLN: INTRAVENOUS | Status: AC

## 2021-08-16 MED ORDER — LIDOCAINE VISCOUS HCL 2 % MT SOLN
15.0000 mL | Freq: Once | OROMUCOSAL | Status: AC
  Administered 2021-08-16: 15 mL via ORAL
  Filled 2021-08-16: qty 15

## 2021-08-16 MED ORDER — ALPRAZOLAM 0.5 MG PO TABS: 0.5000 mg | ORAL_TABLET | ORAL | Status: DC

## 2021-08-16 MED ORDER — PANTOPRAZOLE SODIUM 40 MG IV SOLR
40.0000 mg | Freq: Two times a day (BID) | INTRAVENOUS | Status: DC
  Administered 2021-08-16 – 2021-08-18 (×5): 40 mg via INTRAVENOUS
  Filled 2021-08-16 (×5): qty 40

## 2021-08-16 MED ORDER — ONDANSETRON HCL 4 MG/2ML IJ SOLN
4.0000 mg | Freq: Four times a day (QID) | INTRAMUSCULAR | Status: DC | PRN
  Administered 2021-08-17: 10:00:00 4 mg via INTRAVENOUS
  Filled 2021-08-16: qty 2

## 2021-08-16 MED ORDER — PROMETHAZINE HCL 25 MG PO TABS
12.5000 mg | ORAL_TABLET | Freq: Once | ORAL | Status: AC
  Administered 2021-08-16: 12.5 mg via ORAL
  Filled 2021-08-16: qty 1

## 2021-08-16 NOTE — Progress Notes (Signed)
PROGRESS NOTE    RUBYE STROHMEYER  HAL:[id number] DOB: 01-Sep-1961 DOA: 08/15/2021 PCP: Bernerd Limbo, MD   Brief Narrative:  [name] is a 60 y.o. female with medical history significant of hypertension, hyperlipidemia, chronic diastolic CHF, CAD, aortic stenosis status post stent, COPD, tobacco use, PAD status post aortic stent/SMA stent/bilateral common iliac artery stent, CKD stage V, chronic pain, anxiety, depression, chronic mesenteric ischemia, history of GI bleed with recent admission in September 2022, chronic blood loss anemia. Patient was scheduled for AV graft insertion yesterday but procedure was canceled due to active GI bleed (complaints of melena and hematochezia). Patient states she is having blood in her stool for over a week. She describes it as very dark stool with bright red blood mixed in.  She is having bilateral lower quadrant abdominal pain. She is vomiting but has not vomited any blood. Denies NSAID or alcohol use. States she is not taking any blood thinners. She is also experiencing shortness of breath, wheezing, cough, and right-sided chest pain.  She is using her COPD inhalers Dulera and albuterol.  Assessment & Plan:   Symptomatic acute on chronic blood loss anemia Acute GI bleed Colitis -Patient reporting more than 1 week of melena and hematochezia with known history of diverticulosis -patient has no lower abdominal pain but complains of right shoulder pain -Admitted for GI follow-up, tentative plan for upper endoscopy in the next 24 hours pending clinical course -FOBT positive at intake, hemoglobin near baseline around 9.5 -expected to downtrend some with IV fluids  Acute hypoxemic respiratory failure secondary to questionable CAP versus less likely acute COPD exacerbation -Hypoxic to room air at intake in the 80s  -Continue to wean oxygen as tolerated  -Questionable right lower lung field opacification concerning for aspiration versus pneumonia   -Continue ceftriaxone azithromycin x5 days  -Hold steroids in the setting of GI bleed, presumed -COVID/influenza negative at intake   AKI on CKD stage V -Likely prerenal in setting of above with poor p.o. intake -Plan for AV graft insertion on the 14th held up due to presumed active GI bleed -Follow closely, if kidney function continues to deteriorate may need to transition to [facility] main hospital for nephrology consult and discussion for dialysis, currently able to tolerate IV fluids, creatinine somewhat downtrending appropriately   Hypertension -Blood pressure currently elevated in the setting of atypical right-sided chest pain  -Continue antihypertensives as necessary   Chest pain, atypical, right-sided, without provocation or alleviating factors Concern for pain seeking behavior -In "intractable shoulder pain" this afternoon requesting specifically Dilaudid IV only refusing further p.o. analgesics  -Telemetry and EKG unremarkable repeat chest x-ray pending -Troponin minimally elevated at 21 and 22, not indicative of any acute coronary syndrome reassuring given negative EKG as well -Patient asleep prior to evaluation resting comfortably, upon awaking she immediately was in intractable pain -avoid IV narcotics as reasonable, certainly if chest x-ray is abnormal or there are clinical findings to explain her transient right chest/shoulder pain narcotics could be considered.  Hyperlipidemia -Continue Lipitor and Zetia   Chronic diastolic CHF Does not appear volume overloaded on exam. BNP elevated in setting of decreased kidney function/CKD   Anxiety, depression -Continue Xanax and Effexor   DVT prophylaxis: SCDs Code Status: Full Family Communication: None available  Status is: inpt  Dispo: The patient is from: home              Anticipated d/c is to: home  Anticipated d/c date is: 24-48h              Patient currently NOT medically stable for  discharge  Consultants:  GI  Procedures:  Endoscopy 08/17/21  Antimicrobials:  none   Subjective: No acute issues/events - continues to transiently complain of R shoulder pain, ROS otherwise unremarkable this am - overtly denies BRBPR or dark stool this am  Objective: Vitals:   08/16/21 0200 08/16/21 0300 08/16/21 0400 08/16/21 0600  BP: (!) 178/93 (!) 172/86 (!) 169/90 (!) 174/96  Pulse: 79 79 73 72  Resp: 16 19 19 18   Temp:      TempSrc:      SpO2: 97% 96% 91% 96%    Intake/Output Summary (Last 24 hours) at 08/16/2021 0805 Last data filed at 08/16/2021 0430 Gross per 24 hour  Intake 250 ml  Output --  Net 250 ml   There were no vitals filed for this visit.  Examination:  General exam: Appears calm and comfortable  Respiratory system: Clear to auscultation. Respiratory effort normal. Cardiovascular system: S1 & S2 heard, RRR. No JVD, murmurs, rubs, gallops or clicks. No pedal edema. Gastrointestinal system: Abdomen is nondistended, soft and nontender. No organomegaly or masses felt. Normal bowel sounds heard. Central nervous system: Alert and oriented. No focal neurological deficits. Extremities: Symmetric 5 x 5 power. Skin: No rashes, lesions or ulcers Psychiatry: Judgement and insight appear normal. Mood & affect appropriate.     Data Reviewed: I have personally reviewed following labs and imaging studies  CBC: Recent Labs  Lab 08/15/21 1108 08/15/21 1551 08/15/21 2358 08/16/21 0740  WBC 8.7 8.6  --   --   NEUTROABS 7.0 6.8  --   --   HGB 9.4* 9.4* 8.7* 9.1*  HCT 27.2* 27.4* 25.0* 25.9*  MCV 89.7 89.3  --   --   PLT 509.0* 433*  --   --    Basic Metabolic Panel: Recent Labs  Lab 08/15/21 1551  NA 133*  K 3.4*  CL 100  CO2 21*  GLUCOSE 100*  BUN 47*  CREATININE 5.61*  CALCIUM 8.3*   GFR: Estimated Creatinine Clearance: 7.4 mL/min (A) (by C-G formula based on SCr of 5.61 mg/dL (H)). Liver Function Tests: No results for input(s): AST,  ALT, ALKPHOS, BILITOT, PROT, ALBUMIN in the last 168 hours. No results for input(s): LIPASE, AMYLASE in the last 168 hours. No results for input(s): AMMONIA in the last 168 hours. Coagulation Profile: Recent Labs  Lab 08/15/21 2358  INR 0.9   Cardiac Enzymes: No results for input(s): CKTOTAL, CKMB, CKMBINDEX, TROPONINI in the last 168 hours. BNP (last 3 results) No results for input(s): PROBNP in the last 8760 hours. HbA1C: No results for input(s): HGBA1C in the last 72 hours. CBG: No results for input(s): GLUCAP in the last 168 hours. Lipid Profile: No results for input(s): CHOL, HDL, LDLCALC, TRIG, CHOLHDL, LDLDIRECT in the last 72 hours. Thyroid Function Tests: No results for input(s): TSH, T4TOTAL, FREET4, T3FREE, THYROIDAB in the last 72 hours. Anemia Panel: No results for input(s): VITAMINB12, FOLATE, FERRITIN, TIBC, IRON, RETICCTPCT in the last 72 hours. Sepsis Labs: Recent Labs  Lab 08/15/21 2358  LATICACIDVEN 0.5    Recent Results (from the past 240 hour(s))  Resp Panel by RT-PCR (Flu A&B, Covid) Nasopharyngeal Swab     Status: None   Collection Time: 08/16/21  3:51 AM   Specimen: Nasopharyngeal Swab; Nasopharyngeal(NP) swabs in vial transport medium  Result Value Ref Range Status  SARS Coronavirus 2 by RT PCR NEGATIVE NEGATIVE Final    Comment: (NOTE) SARS-CoV-2 target nucleic acids are NOT DETECTED.  The SARS-CoV-2 RNA is generally detectable in upper respiratory specimens during the acute phase of infection. The lowest concentration of SARS-CoV-2 viral copies this assay can detect is 138 copies/mL. A negative result does not preclude SARS-Cov-2 infection and should not be used as the sole basis for treatment or other patient management decisions. A negative result may occur with  improper specimen collection/handling, submission of specimen other than nasopharyngeal swab, presence of viral mutation(s) within the areas targeted by this assay, and inadequate  number of viral copies(<138 copies/mL). A negative result must be combined with clinical observations, patient history, and epidemiological information. The expected result is Negative.  Fact Sheet for Patients:  EntrepreneurPulse.com.au  Fact Sheet for Healthcare Providers:  IncredibleEmployment.be  This test is no t yet approved or cleared by the [place] FDA and  has been authorized for detection and/or diagnosis of SARS-CoV-2 by FDA under an Emergency Use Authorization (EUA). This EUA will remain  in effect (meaning this test can be used) for the duration of the COVID-19 declaration under Section 564(b)(1) of the Act, 21 U.S.C.section 360bbb-3(b)(1), unless the authorization is terminated  or revoked sooner.       Influenza A by PCR NEGATIVE NEGATIVE Final   Influenza B by PCR NEGATIVE NEGATIVE Final    Comment: (NOTE) The Xpert Xpress SARS-CoV-2/FLU/RSV plus assay is intended as an aid in the diagnosis of influenza from Nasopharyngeal swab specimens and should not be used as a sole basis for treatment. Nasal washings and aspirates are unacceptable for Xpert Xpress SARS-CoV-2/FLU/RSV testing.  Fact Sheet for Patients: EntrepreneurPulse.com.au  Fact Sheet for Healthcare Providers: IncredibleEmployment.be  This test is not yet approved or cleared by the [place] FDA and has been authorized for detection and/or diagnosis of SARS-CoV-2 by FDA under an Emergency Use Authorization (EUA). This EUA will remain in effect (meaning this test can be used) for the duration of the COVID-19 declaration under Section 564(b)(1) of the Act, 21 U.S.C. section 360bbb-3(b)(1), unless the authorization is terminated or revoked.  Performed at [facility], [place] [address] [place], [place] [address]          Radiology Studies: CT ABDOMEN PELVIS WO CONTRAST  Result Date:  08/16/2021 CLINICAL DATA:  Acute abdominal pain with rectal bleeding, initial encounter EXAM: CT ABDOMEN AND PELVIS WITHOUT CONTRAST TECHNIQUE: Multidetector CT imaging of the abdomen and pelvis was performed following the standard protocol without IV contrast. COMPARISON:  07/25/2021 FINDINGS: Lower chest: Mild atelectatic changes are noted in the left lung base. Hepatobiliary: No focal liver abnormality is seen. No gallstones, gallbladder wall thickening, or biliary dilatation. Pancreas: Unremarkable. No pancreatic ductal dilatation or surrounding inflammatory changes. Spleen: Normal in size without focal abnormality. Adrenals/Urinary Tract: Adrenal glands are within normal limits. No renal calculi or obstructive changes are seen. The bladder is partially distended. Stomach/Bowel: Mild wall thickening is noted within the colon which may be related to incomplete distension although the possibility of mild colitis could not be totally excluded given the patient's clinical history. Changes of prior appendectomy are seen. Small bowel and stomach appear within normal limits. Vascular/Lymphatic: Diffuse atherosclerotic calcifications are noted. Kissing iliac stents are seen. No significant lymphadenopathy is noted. Reproductive: Status post hysterectomy. No adnexal masses. Other: No abdominal wall hernia or abnormality. No abdominopelvic ascites. Musculoskeletal: No acute or significant osseous findings. IMPRESSION: Mild wall thickening within the  colon as described. This may represent some mild colitis. No other focal abnormality is noted. Electronically Signed   By: Inez Catalina M.D.   On: 08/16/2021 01:23   DG Chest Portable 1 View  Result Date: 08/15/2021 CLINICAL DATA:  Cough and fever EXAM: PORTABLE CHEST 1 VIEW COMPARISON:  07/25/2021 FINDINGS: Increased opacity in the parahilar right lung. No pleural effusion or pneumothorax. Lungs are hyperinflated. IMPRESSION: Increased opacity in the parahilar right  lung, possibly infection. Electronically Signed   By: Ulyses Jarred M.D.   On: 08/15/2021 23:44    Scheduled Meds:  sodium chloride   Intravenous Once   ALPRAZolam  1 mg Oral QHS   atorvastatin  80 mg Oral QHS   budesonide (PULMICORT) nebulizer solution  0.25 mg Nebulization BID   calcium acetate  2,001 mg Oral See admin instructions   ezetimibe  10 mg Oral Daily   ipratropium-albuterol  3 mL Nebulization Q6H   pantoprazole (PROTONIX) IV  40 mg Intravenous Q12H   venlafaxine XR  300 mg Oral Q breakfast   Continuous Infusions:  sodium chloride 75 mL/hr at 08/16/21 0804   azithromycin     cefTRIAXone (ROCEPHIN)  IV     metronidazole       LOS: 0 days   Time spent: 27min  Vasil Juhasz C Pavlos Yon, DO [facility]  If 7PM-7AM, please contact night-coverage [email or web address]  08/16/2021, 8:05 AM

## 2021-08-16 NOTE — Consult Note (Addendum)
Consultation  Referring Provider: Dr. Marlowe Sax    Primary Care Physician:  Bernerd Limbo, MD Primary Gastroenterologist:   Dr. Ardis Hughs      Reason for Consultation:   Acute on chronic GI bleed         HPI:   [name] is a 60 y.o. female with past medical history significant for hypertension, hyperlipidemia, chronic diastolic CHF, CAD, aortic stenosis status post stent 12/01/2020, COPD, PAD, CKD stage V, chronic pain, anxiety, depression, chronic mesenteric ischemia and history of GI bleed with recent admission in September 2022 with chronic blood loss anemia, who presented to the hospital with a complaint of rectal bleeding.    Apparently patient was scheduled for an AV graft insertion yesterday but procedure was canceled due to active GI bleed (complaints of melena and hematochezia).  Labs as below.  Patient also complained of a cough and fever.    At time of admission, patient explained she had been having some blood in her stool for over a week and described it as very dark with bright red blood mixed in.  Associated symptoms included bilateral lower abdominal pain.  Had also been vomiting some.    Along with this was experiencing shortness of breath, wheezing, cough and right-sided chest pain.  Had been using her COPD inhalers Dulera and albuterol.    At time of my interview patient has been given Xanax and is very disoriented.  She hardly answers any of my questions appropriately, does tell me she has been having some "rough bowel movements".  This is about all I can get out of her.  Otherwise she is falling asleep.    Denies fever or chills  ED course: Hemoglobin 9.4--> (10.2 on 10/25), potassium 3.4, sodium 133, melena with gross blood noted on rectal exam and FOBT positive, repeat labs showing hemoglobin 8.7, CT abdomen pelvis without contrast showing findings consistent with mild colitis, also requiring 2 L supplemental oxygen, chest x-ray showing increased opacity in the perihilar  right lung suspicious for pneumonia  GI history: 08/14/2021 call to our office: Describes 2 episodes of dark stools with abdominal discomfort in the left lower and right upper abdomen-told to come in for CBC but arrived in the ER in 11/15 08/01/2021 office visit Dr. Ardis Hughs: At that time discussed that she did undergo another CT scan in October 22 which showed apparent wall thickening of the descending sigmoid colon although limited evaluation due to nondistention, correlate for possible colitis, chronic renal left atrophy, severe atherosclerotic disease, discussed her chronic abdominal discomforts thought related to mesenteric ischemia at that time she was having trouble moving her bowels and felt like her "rectum was falling out", noted she is on chronic narcotic pain meds and smelled of marijuana at that time on Linzess 72 mcg pills once daily; plan: Start taking Citrucel on an every day basis and continue Linzess 72 mcg daily 9/22 enteroscopy: Hospitalized shows 3 cm hiatal hernia, nonobstructive Schatzki's ring exam was otherwise normal to the jejunum September admission: For right upper quadrant pain and bloody bowel movements-chronic right upper quadrant pain felt likely due to history of mesenteric ischemia, SMA stenosis status post SMA stenting 7/22 colonoscopy: A single 8 mm hyperplastic polyp removed, diverticulosis in the left colon 11/21 colonoscopy: While inpatient with "fair" prep, 20 mm polyp in ascending colon removed piecemeal technique, sessile serrated polyp 08/07/2020 colonoscopy: Single nonbleeding colonic angiodysplasia treated with APC, 1 8 mm polyp in the distal sigmoid colon, minimal near sigmoid  diverticulosis, patent end-to-end colocolonic anastomosis characterized by healthy-appearing mucosa, nonbleeding internal hemorrhoids, repeat recommended in 6 months due to poor prep Enteroscopy November 2021 while inpatient, esophagus and stomach normal, 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 Anemia 2008  . Anginal pain ([place])   . Anxiety   . Aortic stenosis    abominal aorta, s/p distal aortic stent 09/28/18, 12/01/20  . Arthritis    "all my joints ache" (09/07/2014)  . Asthma   . Bipolar 1 disorder ([place])   . Blood transfusion without reported diagnosis   . Bradycardia   . Bruit   . Carpal tunnel syndrome   . Cataract    forming  . Cervical cancer ([place]) 1985  . CHF (congestive heart failure) ([place])   . Chronic kidney disease (CKD), stage V ([place])   . COPD (chronic obstructive pulmonary disease) ([place]) 2000  . Coronary artery disease   . Daily headache   . Depression 2000  . Diverticulitis 2008  . Emphysema of lung ([place])   . GERD (gastroesophageal reflux disease)   . Glaucoma   . Heart murmur   . History of colon polyps 2009  . HLD (hyperlipidemia) 2013  . Hypertension 2013  . Hypovitaminosis D   . IBS (irritable bowel syndrome) 2008  . Myocardial infarction ([place])    2015  . PAD (peripheral artery disease) ([place])   . Pancreatitis 10/2011  . Pneumonia 07/2014  . RLS (restless legs syndrome)   . Schizophrenia ([place])   . Shortness of breath   . Skin cancer   . Small bowel obstruction ([place]) 2008  . Thyroid disease     Past Surgical History:  Procedure Laterality Date  . ABDOMINAL ANGIOGRAM N/A 09/07/2014   Procedure: ABDOMINAL ANGIOGRAM;  Surgeon: Laverda Page, MD;  Location: [facility] CATH LAB;  Service: Cardiovascular;  Laterality: N/A;  . ABDOMINAL AORTOGRAM W/LOWER EXTREMITY N/A 12/28/2020   Procedure: ABDOMINAL AORTOGRAM W/LOWER EXTREMITY;  Surgeon: Cherre Robins, MD;  Location: [place] CV LAB;  Service:  Cardiovascular;  Laterality: N/A;  . APPENDECTOMY  10/01/1994  . BIOPSY  08/06/2020   Procedure: BIOPSY;  Surgeon: Jackquline Denmark, MD;  Location: [facility] ENDOSCOPY;  Service: Endoscopy;;  . COLON SURGERY  10/01/2006   6 inches of colon removed due to obstruction  . COLONOSCOPY WITH PROPOFOL N/A 10/25/2016   Procedure: COLONOSCOPY WITH PROPOFOL;  Surgeon: Milus Banister, MD;  Location: [facility] ENDOSCOPY;  Service: Endoscopy;  Laterality: N/A;  . COLONOSCOPY WITH PROPOFOL N/A 08/07/2020   Procedure: COLONOSCOPY WITH PROPOFOL;  Surgeon: Jackquline Denmark, MD;  Location: [facility] ENDOSCOPY;  Service: Endoscopy;  Laterality: N/A;  . COLONOSCOPY WITH PROPOFOL N/A 08/27/2020   Procedure: COLONOSCOPY WITH PROPOFOL;  Surgeon: Lavena Bullion, DO;  Location: [facility] ENDOSCOPY;  Service: Gastroenterology;  Laterality: N/A;  . CORONARY ANGIOPLASTY WITH STENT PLACEMENT  09/07/2014   "2"  . ENTEROSCOPY N/A 08/27/2020   Procedure: ENTEROSCOPY;  Surgeon: Lavena Bullion, DO;  Location: [facility] ENDOS[facility];  Service: Gastroenterology;  Laterality: N/A;  Push enteroscopy   . ENTEROSCOPY N/A 06/12/2021   Procedure: ENTEROSCOPY;  Surgeon: Mansouraty, Telford Nab., MD;  Location: [facility]OS[facility];  Service:  Gastroenterology;  Laterality: N/A;  . ESOPHAGOGASTRODUODENOSCOPY (EGD) WITH PROPOFOL N/A 08/06/2020   Procedure: ESOPHAGOGASTRODUODENOSCOPY (EGD) WITH PROPOFOL;  Surgeon: Jackquline Denmark, MD;  [facility];  Service: Endoscopy;  Laterality: N/A;  . GIVENS CAPSULE STUDY N/A 08/24/2020   Procedure: GIVENS CAPSULE STUDY;  Surgeon: Lavena Bullion, DO;  [facility]: [facility];  Service: Gastroenterology;  Laterality: N/A;  . HOT HEMOSTASIS N/A 08/07/2020   Procedure: HOT HEMOSTASIS (ARGON PLASMA COAGULATION/BICAP);  Surgeon: Jackquline Denmark, MD;  Location: [facility];  Service: Endoscopy;  Laterality: N/A;  . HOT HEMOSTASIS N/A 08/27/2020   Procedure: HOT HEMOSTASIS (ARGON PLASMA COAGULATION/BICAP);  Surgeon: Lavena Bullion, DO;   [facility];  Service: Gastroenterology;  Laterality: N/A;  . LEFT HEART CATH AND CORONARY ANGIOGRAPHY N/A 03/25/2018   Procedure: LEFT HEART CATH AND CORONARY ANGIOGRAPHY;  Surgeon: Nigel Mormon, MD;  Location: [place] CV LAB;  Service: Cardiovascular;  Laterality: N/A;  . LEFT HEART CATH AND CORONARY ANGIOGRAPHY N/A 05/09/2021   Procedure: LEFT HEART CATH AND CORONARY ANGIOGRAPHY;  Surgeon: Adrian Prows, MD;  Location: [place] CV LAB;  Service: Cardiovascular;  Laterality: N/A;  . LEFT HEART CATHETERIZATION WITH CORONARY ANGIOGRAM N/A 09/07/2014   Procedure: LEFT HEART CATHETERIZATION WITH CORONARY ANGIOGRAM;  Surgeon: Laverda Page, MD;  Location: [facility] CATH LAB;  Service: Cardiovascular;  Laterality: N/A;  . LOWER EXTREMITY ANGIOGRAPHY  10/29/2017   Procedure: Lower Extremity Angiography;  Surgeon: Adrian Prows, MD;  Location: [place] CV LAB;  Service: Cardiovascular;;  . PERIPHERAL VASCULAR CATHETERIZATION N/A 11/01/2015   Procedure: Renal Angiography;  Surgeon: Adrian Prows, MD;  Location: [place] CV LAB;  Service: Cardiovascular;  Laterality: N/A;  . PERIPHERAL VASCULAR CATHETERIZATION N/A 04/10/2016   Procedure: Renal Angiography;  Surgeon: Adrian Prows, MD;  Location: [place] CV LAB;  Service: Cardiovascular;  Laterality: N/A;  . PERIPHERAL VASCULAR CATHETERIZATION  04/10/2016   Procedure: Peripheral Vascular Intervention;  Surgeon: Adrian Prows, MD;  Location: [place] CV LAB;  Service: Cardiovascular;;  . PERIPHERAL VASCULAR INTERVENTION  10/29/2017   Procedure: PERIPHERAL VASCULAR INTERVENTION;  Surgeon: Adrian Prows, MD;  Location: [place] CV LAB;  Service: Cardiovascular;;  . PERIPHERAL VASCULAR INTERVENTION Bilateral 12/28/2020   Procedure: PERIPHERAL VASCULAR INTERVENTION;  Surgeon: Cherre Robins, MD;  Location: [place] CV LAB;  Service: Cardiovascular;  Laterality: Bilateral;  . POLYPECTOMY  08/07/2020   Procedure: POLYPECTOMY;  Surgeon:  Jackquline Denmark, MD;  [facility];  Service: Endoscopy;;  . POLYPECTOMY  08/27/2020   Procedure: POLYPECTOMY;  Surgeon: Lavena Bullion, DO;  [facility]: [facility];  Service: Gastroenterology;;  . POLYPECTOMY    . RENAL ANGIOGRAPHY N/A 10/29/2017   Procedure: RENAL ANGIOGRAPHY;  Surgeon: Adrian Prows, MD;  Location: [place] CV LAB;  Service: Cardiovascular;  Laterality: N/A;  . RENAL ANGIOGRAPHY N/A 05/10/2020   Procedure: RENAL ANGIOGRAPHY;  Surgeon: Nigel Mormon, MD;  Location: [place] CV LAB;  Service: Cardiovascular;  Laterality: N/A;  . RIGHT OOPHORECTOMY Right 10/01/1994  . SUBMUCOSAL TATTOO INJECTION  06/12/2021   Procedure: SUBMUCOSAL TATTOO INJECTION;  Surgeon: Irving Copas., MD;  [facility];  Service: Gastroenterology;;  . TOTAL ABDOMINAL HYSTERECTOMY  10/02/1995  . UPPER GASTROINTESTINAL ENDOSCOPY      Family History  Problem Relation Age of Onset  . Other Mother        many bowel obstructions  . Heart disease Mother   . Colon polyps Mother   . Kidney cancer Father   . Bone  cancer Father   . Diabetes Father   . Heart disease Father   . Heart disease Brother   . Colon cancer Paternal Grandfather   . Diabetes Daughter   . Esophageal cancer Neg Hx   . Rectal cancer Neg Hx   . Stomach cancer Neg Hx     Social History   Tobacco Use  . Smoking status: Some Days    Packs/day: 1.50    Years: 40.00    Pack years: 60.00    Types: Cigarettes  . Smokeless tobacco: Never  Vaping Use  . Vaping Use: Former  . Substances: THC  Substance Use Topics  . Alcohol use: No  . Drug use: Yes    Frequency: 3.0 times per week    Types: Marijuana    Comment: last use yesterday    Prior to Admission medications   Medication Sig Start Date End Date Taking? Authorizing Provider  ALPRAZolam Duanne Moron) 1 MG tablet Take 0.5-1 tablets by mouth See admin instructions. Take 1 mg by mouth at bedtime and an additional 0.5-1 mg once a day as  needed for anxiety 04/15/17  Yes [provider]  amLODipine (NORVASC) 10 MG tablet TAKE 1 TABLET BY MOUTH ONCE DAILY Patient taking differently: Take 10 mg by mouth daily. 04/13/21  Yes Cantwell, Celeste C, PA-C  atorvastatin (LIPITOR) 80 MG tablet Take 80 mg by mouth at bedtime.   Yes [provider]  calcium acetate (PHOSLO) 667 MG capsule Take 2,001 mg by mouth See admin instructions. Take 2001mg  (3 capsules) by mouth three times a day with meals and 667mg  (1 capsule) with a snack   Yes [provider]  ezetimibe (ZETIA) 10 MG tablet TAKE 1 TABLET BY MOUTH ONCE A DAY Patient taking differently: Take 10 mg by mouth daily. 05/03/21  Yes Cantwell, Celeste C, PA-C  isosorbide mononitrate (IMDUR) 60 MG 24 hr tablet Take 1 tablet (60 mg total) by mouth daily. 06/14/21 09/12/21 Yes Dahal, Marlowe Aschoff, MD  labetalol (NORMODYNE) 100 MG tablet Take 100 mg by mouth 2 (two) times daily.   Yes [provider]  LINZESS 72 MCG capsule TAKE 1 CAPSULE BY MOUTH ONCE DAILY BEFORE BREAKFAST Patient taking differently: Take 72 mcg by mouth daily before breakfast. 08/03/21  Yes Levin Erp, PA  megestrol (MEGACE) 40 MG tablet Take 40 mg by mouth daily.   Yes [provider]  mometasone-formoterol (DULERA) 100-5 MCG/ACT AERO Take 2 puffs first thing in am and then another 2 puffs about 12 hours later. Patient taking differently: Inhale 2 puffs into the lungs every 12 (twelve) hours. 04/26/17  Yes Tanda Rockers, MD  nitroGLYCERIN (NITROSTAT) 0.4 MG SL tablet Place 1 tablet (0.4 mg total) under the tongue every 5 (five) minutes as needed for chest pain. 10/15/19  Yes Miquel Dunn, NP  ondansetron (ZOFRAN-ODT) 4 MG disintegrating tablet Take 1 tablet (4 mg total) by mouth every 8 (eight) hours as needed for nausea or vomiting. 07/25/21  Yes Hayden Rasmussen, MD  oxyCODONE (OXY IR/ROXICODONE) 5 MG immediate release tablet Take 5 mg by mouth in the morning and at  bedtime.   Yes [provider]  pantoprazole (PROTONIX) 40 MG tablet TAKE 1 TABLET BY MOUTH TWICE (2) DAILY Patient taking differently: Take 40 mg by mouth 2 (two) times daily. 06/08/20  Yes Adrian Prows, MD  promethazine (PHENERGAN) 25 MG tablet Take 25 mg by mouth 2 (two) times daily as needed for nausea or vomiting. 08/16/20  Yes  [provider]  venlafaxine XR (EFFEXOR XR) 150 MG 24 hr capsule Take 2 capsules (300 mg total) by mouth daily with breakfast. 05/12/21  Yes Arrien, Jimmy Picket, MD  albuterol (PROVENTIL HFA;VENTOLIN HFA) 108 (90 BASE) MCG/ACT inhaler Inhale 2 puffs into the lungs every 6 (six) hours as needed for wheezing or shortness of breath (asthma). Patient not taking: Reported on 08/16/2021    [provider]  azelastine (ASTELIN) 0.1 % nasal spray Place 1 spray into both nostrils daily as needed (seasonal allergies). Patient not taking: Reported on 08/16/2021 06/24/19   [provider]  calcitRIOL (ROCALTROL) 0.5 MCG capsule Take 0.5 mcg by mouth daily. Patient not taking: No sig reported    [provider]  feeding supplement (ENSURE ENLIVE / ENSURE PLUS) LIQD Take 237 mLs by mouth daily. Patient not taking: No sig reported 06/13/21   Terrilee Croak, MD  labetalol (NORMODYNE) 200 MG tablet Take 1 tablet (200 mg total) by mouth 2 (two) times daily. Patient not taking: No sig reported 06/13/21 09/11/21  Terrilee Croak, MD    Current Facility-Administered Medications  Medication Dose Route Frequency Provider Last Rate Last Admin  . 0.9 %  sodium chloride infusion (Manually program via Guardrails IV Fluids)   Intravenous Once Shela Leff, MD      . 0.9 %  sodium chloride infusion   Intravenous Continuous Shela Leff, MD 75 mL/hr at 08/16/21 0804 New Bag at 08/16/21 0804  . albuterol (PROVENTIL) (2.5 MG/3ML) 0.083% nebulizer solution 2.5 mg  2.5 mg Nebulization Q6H PRN Shela Leff, MD      . ALPRAZolam Duanne Moron) tablet  0.5-1 mg  0.5-1 mg Oral Daily PRN Shela Leff, MD   1 mg at 08/16/21 0802  . ALPRAZolam (XANAX) tablet 1 mg  1 mg Oral QHS Shela Leff, MD      . atorvastatin (LIPITOR) tablet 80 mg  80 mg Oral QHS Shela Leff, MD      . azithromycin (ZITHROMAX) 500 mg in sodium chloride 0.9 % 250 mL IVPB  500 mg Intravenous Q24H Shela Leff, MD      . budesonide (PULMICORT) nebulizer solution 0.25 mg  0.25 mg Nebulization BID Shela Leff, MD   0.25 mg at 08/16/21 0816  . calcium acetate (PHOSLO) capsule 2,001 mg  2,001 mg Oral See admin instructions Shela Leff, MD      . cefTRIAXone (ROCEPHIN) 1 g in sodium chloride 0.9 % 100 mL IVPB  1 g Intravenous Q24H Shela Leff, MD      . ezetimibe (ZETIA) tablet 10 mg  10 mg Oral Daily Shela Leff, MD      . ipratropium-albuterol (DUONEB) 0.5-2.5 (3) MG/3ML nebulizer solution 3 mL  3 mL Nebulization Q6H Shela Leff, MD   3 mL at 08/16/21 0805  . metroNIDAZOLE (FLAGYL) IVPB 500 mg  500 mg Intravenous Quay Burow, MD 100 mL/hr at 08/16/21 0805 500 mg at 08/16/21 0805  . ondansetron (ZOFRAN) injection 4 mg  4 mg Intravenous Q6H PRN Shela Leff, MD      . oxyCODONE (Oxy IR/ROXICODONE) immediate release tablet 5 mg  5 mg Oral Q6H PRN Shela Leff, MD      . pantoprazole (PROTONIX) injection 40 mg  40 mg Intravenous Q12H Shela Leff, MD      . venlafaxine XR (EFFEXOR-XR) 24 hr capsule 300 mg  300 mg Oral Q breakfast Shela Leff, MD   300 mg at 08/16/21 9371   Current Outpatient Medications  Medication Sig Dispense Refill  .  ALPRAZolam (XANAX) 1 MG tablet Take 0.5-1 tablets by mouth See admin instructions. Take 1 mg by mouth at bedtime and an additional 0.5-1 mg once a day as needed for anxiety    . amLODipine (NORVASC) 10 MG tablet TAKE 1 TABLET BY MOUTH ONCE DAILY (Patient taking differently: Take 10 mg by mouth daily.) 90 tablet 1  . atorvastatin (LIPITOR) 80 MG tablet Take 80  mg by mouth at bedtime.    . calcium acetate (PHOSLO) 667 MG capsule Take 2,001 mg by mouth See admin instructions. Take 2001mg  (3 capsules) by mouth three times a day with meals and 667mg  (1 capsule) with a snack    . ezetimibe (ZETIA) 10 MG tablet TAKE 1 TABLET BY MOUTH ONCE A DAY (Patient taking differently: Take 10 mg by mouth daily.) 90 tablet 3  . isosorbide mononitrate (IMDUR) 60 MG 24 hr tablet Take 1 tablet (60 mg total) by mouth daily. 90 tablet 0  . labetalol (NORMODYNE) 100 MG tablet Take 100 mg by mouth 2 (two) times daily.    Marland Kitchen LINZESS 72 MCG capsule TAKE 1 CAPSULE BY MOUTH ONCE DAILY BEFORE BREAKFAST (Patient taking differently: Take 72 mcg by mouth daily before breakfast.) 30 capsule 2  . megestrol (MEGACE) 40 MG tablet Take 40 mg by mouth daily.    . mometasone-formoterol (DULERA) 100-5 MCG/ACT AERO Take 2 puffs first thing in am and then another 2 puffs about 12 hours later. (Patient taking differently: Inhale 2 puffs into the lungs every 12 (twelve) hours.) 1 Inhaler 11  . nitroGLYCERIN (NITROSTAT) 0.4 MG SL tablet Place 1 tablet (0.4 mg total) under the tongue every 5 (five) minutes as needed for chest pain. 30 tablet 1  . ondansetron (ZOFRAN-ODT) 4 MG disintegrating tablet Take 1 tablet (4 mg total) by mouth every 8 (eight) hours as needed for nausea or vomiting. 20 tablet 0  . oxyCODONE (OXY IR/ROXICODONE) 5 MG immediate release tablet Take 5 mg by mouth in the morning and at bedtime.    . pantoprazole (PROTONIX) 40 MG tablet TAKE 1 TABLET BY MOUTH TWICE (2) DAILY (Patient taking differently: Take 40 mg by mouth 2 (two) times daily.) 180 tablet 2  . promethazine (PHENERGAN) 25 MG tablet Take 25 mg by mouth 2 (two) times daily as needed for nausea or vomiting.    . venlafaxine XR (EFFEXOR XR) 150 MG 24 hr capsule Take 2 capsules (300 mg total) by mouth daily with breakfast.    . albuterol (PROVENTIL HFA;VENTOLIN HFA) 108 (90 BASE) MCG/ACT inhaler Inhale 2 puffs into the lungs every  6 (six) hours as needed for wheezing or shortness of breath (asthma). (Patient not taking: Reported on 08/16/2021)    . azelastine (ASTELIN) 0.1 % nasal spray Place 1 spray into both nostrils daily as needed (seasonal allergies). (Patient not taking: Reported on 08/16/2021)    . calcitRIOL (ROCALTROL) 0.5 MCG capsule Take 0.5 mcg by mouth daily. (Patient not taking: No sig reported)    . feeding supplement (ENSURE ENLIVE / ENSURE PLUS) LIQD Take 237 mLs by mouth daily. (Patient not taking: No sig reported) 237 mL 12  . labetalol (NORMODYNE) 200 MG tablet Take 1 tablet (200 mg total) by mouth 2 (two) times daily. (Patient not taking: No sig reported) 180 tablet 0    Allergies as of 08/15/2021 - Review Complete 08/15/2021  Allergen Reaction Noted  . Doxycycline Anaphylaxis and Hives   . Hydralazine Rash 08/16/2020  . Aspirin Other (See Comments) 10/16/2011  . Hydrocodone  Nausea And Vomiting 08/11/2021  . Ibuprofen Other (See Comments) 07/25/2021  . Tylenol [acetaminophen] Other (See Comments) 07/25/2021  . Iohexol Itching and Other (See Comments) 10/16/2011     Review of Systems:   (minimal due to effects of Xanax and patient falling asleep) Constitutional: No fever Cardiovascular: No chest pain Respiratory: +SOB Gastrointestinal: See HPI and otherwise negative   Physical Exam:  Vital signs in last 24 hours: Temp:  [98.2 F (36.8 C)] 98.2 F (36.8 C) (11/15 1530) Pulse Rate:  [72-92] 72 (11/16 0600) Resp:  [16-23] 18 (11/16 0600) BP: (158-204)/(83-96) 174/96 (11/16 0600) SpO2:  [90 %-97 %] 96 % (11/16 0600)   General:   Chronically ill-appearing Caucasian female appears to be in NAD, Well developed, Well nourished, drowsy and disoriented Head:  Normocephalic and atraumatic. Eyes:   PEERL, EOMI. No icterus. Conjunctiva pink. Ears:  Normal auditory acuity. Neck:  Supple Throat: Oral cavity and pharynx without inflammation, swelling or lesion. Teeth in good condition. Lungs:  Respirations even and unlabored. + Rhonchi Heart: Normal S1, S2. No MRG. Regular rate and rhythm. No peripheral edema, cyanosis or pallor.  Abdomen:  Soft, nondistended, mild bilateral lower TTP, no rebound or guarding. Normal bowel sounds. No appreciable masses or hepatomegaly. Rectal:  Not performed.  Msk:  Symmetrical without gross deformities. Peripheral pulses intact.  Extremities:  Without edema, no deformity or joint abnormality. Normal ROM, normal sensation. Neurologic: Falling asleep/disoriented Skin:   Dry and intact without significant lesions or rashes. Psychiatric: Falling asleep and disoriented  LAB RESULTS: Recent Labs    08/15/21 1108 08/15/21 1551 08/15/21 2358 08/16/21 0740  WBC 8.7 8.6  --   --   HGB 9.4* 9.4* 8.7* 9.1*  HCT 27.2* 27.4* 25.0* 25.9*  PLT 509.0* 433*  --   --    BMET Recent Labs    08/15/21 1551 08/16/21 0740  NA 133* 132*  K 3.4* 3.5  CL 100 99  CO2 21* 20*  GLUCOSE 100* 88  BUN 47* 44*  CREATININE 5.61* 5.43*  CALCIUM 8.3* 8.3*    PT/INR Recent Labs    08/15/21 2358  LABPROT 12.4  INR 0.9    STUDIES: CT ABDOMEN PELVIS WO CONTRAST  Result Date: 08/16/2021 CLINICAL DATA:  Acute abdominal pain with rectal bleeding, initial encounter EXAM: CT ABDOMEN AND PELVIS WITHOUT CONTRAST TECHNIQUE: Multidetector CT imaging of the abdomen and pelvis was performed following the standard protocol without IV contrast. COMPARISON:  07/25/2021 FINDINGS: Lower chest: Mild atelectatic changes are noted in the left lung base. Hepatobiliary: No focal liver abnormality is seen. No gallstones, gallbladder wall thickening, or biliary dilatation. Pancreas: Unremarkable. No pancreatic ductal dilatation or surrounding inflammatory changes. Spleen: Normal in size without focal abnormality. Adrenals/Urinary Tract: Adrenal glands are within normal limits. No renal calculi or obstructive changes are seen. The bladder is partially distended. Stomach/Bowel: Mild wall  thickening is noted within the colon which may be related to incomplete distension although the possibility of mild colitis could not be totally excluded given the patient's clinical history. Changes of prior appendectomy are seen. Small bowel and stomach appear within normal limits. Vascular/Lymphatic: Diffuse atherosclerotic calcifications are noted. Kissing iliac stents are seen. No significant lymphadenopathy is noted. Reproductive: Status post hysterectomy. No adnexal masses. Other: No abdominal wall hernia or abnormality. No abdominopelvic ascites. Musculoskeletal: No acute or significant osseous findings. IMPRESSION: Mild wall thickening within the colon as described. This may represent some mild colitis. No other focal abnormality is noted. Electronically Signed  By: Inez Catalina M.D.   On: 08/16/2021 01:23   DG Chest Portable 1 View  Result Date: 08/15/2021 CLINICAL DATA:  Cough and fever EXAM: PORTABLE CHEST 1 VIEW COMPARISON:  07/25/2021 FINDINGS: Increased opacity in the parahilar right lung. No pleural effusion or pneumothorax. Lungs are hyperinflated. IMPRESSION: Increased opacity in the parahilar right lung, possibly infection. Electronically Signed   By: Ulyses Jarred M.D.   On: 08/15/2021 23:44      Impression / Plan:   Impression: 1.   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  Acute hypoxemic respiratory failure secondary to CAP and possible acute COPD exacerbation: Being maintained on  2 L supplemental oxygen, COVID and flu negative, currently on ceftriaxone and azithromycin given appearance of pneumonia on x-ray 3.  AKI on CKD stage V: Creatinine 5.6 (5.1 on 07/25/2021) had previously and scheduled for AV graft insertion yesterday but procedure was canceled due to active GI bleed 4.  Chronic diastolic CHF  Plan: 1.  Appears to be a slow decrease in hemoglobin over the past couple of months and with description of melena would recommend repeat enteroscopy and possible pill capsule endoscopy for further evaluation of AVMs.  We will plan on doing this tomorrow 11/17 given new pneumonia, to allow patient some time on antibiotics and for improvement in her respiratory status. 2.  Agree with monitoring hemoglobin Q 12hr with transfusion as needed less than 7 3.  Recommend correction of electrolytes in time for procedure tomorrow 4.  We will recheck iron studies 5.  Of note questionable mild colitis on CT, patient reports no history of diarrhea and is only minimally tender today.  Did have recently normal colonoscopy. 6.  Patient can be on liquid diet today as tolerated and n.p.o. at midnight.  Thank you for your kind consultation, we will continue to follow.  Lavone Nian [facility]  08/16/2021, 8:18 AM

## 2021-08-16 NOTE — ED Notes (Signed)
One save gold tube in main lab

## 2021-08-16 NOTE — H&P (Addendum)
History and Physical    [name] HGD:[id number] DOB: Jun 05, 1961 DOA: 08/15/2021  PCP: Bernerd Limbo, MD Patient coming from: Home  Chief Complaint: Rectal bleeding  HPI: [name] is a 60 y.o. female with medical history significant of hypertension, hyperlipidemia, chronic diastolic CHF, CAD, aortic stenosis status post stent, COPD, tobacco use, PAD status post aortic stent/  SMA stent/  bilateral common iliac artery stent, CKD stage V, chronic pain, anxiety, depression, chronic mesenteric ischemia, history of GI bleed with recent admission in September 2022, chronic blood loss anemia.  Patient was scheduled for AV graft insertion yesterday but procedure was canceled due to active GI bleed (complaints of melena and hematochezia).  Labs showing hemoglobin 9.4, was 10.2 on 07/25/2021.  In the ED, hemodynamically stable (blood pressure elevated).  Labs showing WBC 8.6, hemoglobin 9.4, MCV 89.3, platelet count 433k.  Sodium 133, potassium 3.4, chloride 100, bicarb 21, BUN 47, creatinine 5.6, glucose 100.  Melena with gross blood noted on rectal exam and FOBT positive.  Repeat labs showing hemoglobin 8.7.  Lactic acid 0.5.  High-sensitivity troponin 21 >22.  CT abdomen pelvis without contrast showing findings consistent with mild colitis.  Patient also complained of cough and fever.  Desatted to high 80s on room air and placed on 2 L supplemental oxygen. Chest x-ray showing increased opacity in the parahilar right lung suspicious for pneumonia. Patient was given fentanyl, Zofran, oxycodone, IV Protonix 40 mg, ceftriaxone, and azithromycin.  [facility] (Dr. Tarri Glenn) consulted.  Patient states she is having blood in her stool for over a week.  She describes it as very dark stool with bright red blood mixed in.  She is having bilateral lower quadrant abdominal pain.  She is vomiting but has not vomited any blood.  Denies NSAID or alcohol use.  States she is not taking any blood thinners.  She is  also experiencing shortness of breath, wheezing, cough, and right-sided chest pain.  She is using her COPD inhalers Dulera and albuterol.  Review of Systems:  All systems reviewed and apart from history of presenting illness, are negative.  Past Medical History:  Diagnosis Date   Anemia 2008   Anginal pain ([place])    Anxiety    Aortic stenosis    abominal aorta, s/p distal aortic stent 09/28/18, 12/01/20   Arthritis    "all my joints ache" (09/07/2014)   Asthma    Bipolar 1 disorder ([place])    Blood transfusion without reported diagnosis    Bradycardia    Bruit    Carpal tunnel syndrome    Cataract    forming   Cervical cancer ([place]) 1985   CHF (congestive heart failure) ([place])    Chronic kidney disease (CKD), stage V (HCC)    COPD (chronic obstructive pulmonary disease) ([place]) 2000   Coronary artery disease    Daily headache    Depression 2000   Diverticulitis 2008   Emphysema of lung ([place])    GERD (gastroesophageal reflux disease)    Glaucoma    Heart murmur    History of colon polyps 2009   HLD (hyperlipidemia) 2013   Hypertension 2013   Hypovitaminosis D    IBS (irritable bowel syndrome) 2008   Myocardial infarction [facility])    2015   PAD (peripheral artery disease) (HCC)    Pancreatitis 10/2011   Pneumonia 07/2014   RLS (restless legs syndrome)    Schizophrenia (HCC)    Shortness of breath    Skin cancer  Small bowel obstruction ([place]) 2008   Thyroid disease     Past Surgical History:  Procedure Laterality Date   ABDOMINAL ANGIOGRAM N/A 09/07/2014   Procedure: ABDOMINAL ANGIOGRAM;  Surgeon: Laverda Page, MD;  Location: [facility] CATH LAB;  Service: Cardiovascular;  Laterality: N/A;   ABDOMINAL AORTOGRAM W/LOWER EXTREMITY N/A 12/28/2020   Procedure: ABDOMINAL AORTOGRAM W/LOWER EXTREMITY;  Surgeon: Cherre Robins, MD;  Location: [place] CV LAB;  Service: Cardiovascular;  Laterality: N/A;   APPENDECTOMY  10/01/1994   BIOPSY  08/06/2020   Procedure: BIOPSY;   Surgeon: Jackquline Denmark, MD;  Location: [facility] ENDOSCOPY;  Service: Endoscopy;;   COLON SURGERY  10/01/2006   6 inches of colon removed due to obstruction   COLONOSCOPY WITH PROPOFOL N/A 10/25/2016   Procedure: COLONOSCOPY WITH PROPOFOL;  Surgeon: Milus Banister, MD;  Location: [facility] ENDOSCOPY;  Service: Endoscopy;  Laterality: N/A;   COLONOSCOPY WITH PROPOFOL N/A 08/07/2020   Procedure: COLONOSCOPY WITH PROPOFOL;  Surgeon: Jackquline Denmark, MD;  Location: [facility] ENDOS[facility];  Service: Endoscopy;  Laterality: N/A;   COLONOSCOPY WITH PROPOFOL N/A 08/27/2020   Procedure: COLONOSCOPY WITH PROPOFOL;  Surgeon: Lavena Bullion, DO;  Location: [facility]OS[facility];  Service: Gastroenterology;  Laterality: N/A;   CORONARY ANGIOPLASTY WITH STENT PLACEMENT  09/07/2014   "2"   ENTEROSCOPY N/A 08/27/2020   Procedure: ENTEROSCOPY;  Surgeon: Lavena Bullion, DO;  Location: [facility];  Service: Gastroenterology;  Laterality: N/A;  Push enteroscopy    ENTEROSCOPY N/A 06/12/2021   Procedure: ENTEROSCOPY;  Surgeon: Rush Landmark Telford Nab., MD;  Location: [facility];  Service: Gastroenterology;  Laterality: N/A;   ESOPHAGOGASTRODUODENOSCOPY (EGD) WITH PROPOFOL N/A 08/06/2020   Procedure: ESOPHAGOGASTRODUODENOSCOPY (EGD) WITH PROPOFOL;  Surgeon: Jackquline Denmark, MD;  [facility];  Service: Endoscopy;  Laterality: N/A;   GIVENS CAPSULE STUDY N/A 08/24/2020   Procedure: GIVENS CAPSULE STUDY;  Surgeon: Lavena Bullion, DO;  [facility]: [facility];  Service: Gastroenterology;  Laterality: N/A;   HOT HEMOSTASIS N/A 08/07/2020   Procedure: HOT HEMOSTASIS (ARGON PLASMA COAGULATION/BICAP);  Surgeon: Jackquline Denmark, MD;  Location: [facility];  Service: Endoscopy;  Laterality: N/A;   HOT HEMOSTASIS N/A 08/27/2020   Procedure: HOT HEMOSTASIS (ARGON PLASMA COAGULATION/BICAP);  Surgeon: Lavena Bullion, DO;  [facility];  Service: Gastroenterology;  Laterality: N/A;   LEFT HEART CATH AND CORONARY ANGIOGRAPHY N/A  03/25/2018   Procedure: LEFT HEART CATH AND CORONARY ANGIOGRAPHY;  Surgeon: Nigel Mormon, MD;  Location: [place] CV LAB;  Service: Cardiovascular;  Laterality: N/A;   LEFT HEART CATH AND CORONARY ANGIOGRAPHY N/A 05/09/2021   Procedure: LEFT HEART CATH AND CORONARY ANGIOGRAPHY;  Surgeon: Adrian Prows, MD;  Location: [place] CV LAB;  Service: Cardiovascular;  Laterality: N/A;   LEFT HEART CATHETERIZATION WITH CORONARY ANGIOGRAM N/A 09/07/2014   Procedure: LEFT HEART CATHETERIZATION WITH CORONARY ANGIOGRAM;  Surgeon: Laverda Page, MD;  Location: [facility] CATH LAB;  Service: Cardiovascular;  Laterality: N/A;   LOWER EXTREMITY ANGIOGRAPHY  10/29/2017   Procedure: Lower Extremity Angiography;  Surgeon: Adrian Prows, MD;  Location: [place] CV LAB;  Service: Cardiovascular;;   PERIPHERAL VASCULAR CATHETERIZATION N/A 11/01/2015   Procedure: Renal Angiography;  Surgeon: Adrian Prows, MD;  Location: [place] CV LAB;  Service: Cardiovascular;  Laterality: N/A;   PERIPHERAL VASCULAR CATHETERIZATION N/A 04/10/2016   Procedure: Renal Angiography;  Surgeon: Adrian Prows, MD;  Location: [place] CV LAB;  Service: Cardiovascular;  Laterality: N/A;   PERIPHERAL VASCULAR CATHETERIZATION  04/10/2016   Procedure: Peripheral  Vascular Intervention;  Surgeon: Adrian Prows, MD;  Location: [place] CV LAB;  Service: Cardiovascular;;   PERIPHERAL VASCULAR INTERVENTION  10/29/2017   Procedure: PERIPHERAL VASCULAR INTERVENTION;  Surgeon: Adrian Prows, MD;  Location: [place] CV LAB;  Service: Cardiovascular;;   PERIPHERAL VASCULAR INTERVENTION Bilateral 12/28/2020   Procedure: PERIPHERAL VASCULAR INTERVENTION;  Surgeon: Cherre Robins, MD;  Location: [place] CV LAB;  Service: Cardiovascular;  Laterality: Bilateral;   POLYPECTOMY  08/07/2020   Procedure: POLYPECTOMY;  Surgeon: Jackquline Denmark, MD;  [facility];  Service: Endoscopy;;   POLYPECTOMY  08/27/2020   Procedure: POLYPECTOMY;  Surgeon:  Lavena Bullion, DO;  [facility];  Service: Gastroenterology;;   POLYPECTOMY     RENAL ANGIOGRAPHY N/A 10/29/2017   Procedure: RENAL ANGIOGRAPHY;  Surgeon: Adrian Prows, MD;  Location: [place] CV LAB;  Service: Cardiovascular;  Laterality: N/A;   RENAL ANGIOGRAPHY N/A 05/10/2020   Procedure: RENAL ANGIOGRAPHY;  Surgeon: Nigel Mormon, MD;  Location: [place] CV LAB;  Service: Cardiovascular;  Laterality: N/A;   RIGHT OOPHORECTOMY Right 10/01/1994   SUBMUCOSAL TATTOO INJECTION  06/12/2021   Procedure: SUBMUCOSAL TATTOO INJECTION;  Surgeon: Irving Copas., MD;  [facility];  Service: Gastroenterology;;   TOTAL ABDOMINAL HYSTERECTOMY  10/02/1995   UPPER GASTROINTESTINAL ENDOSCOPY       reports that she has been smoking cigarettes. She has a 60.00 pack-year smoking history. She has never used smokeless tobacco. She reports current drug use. Frequency: 3.00 times per week. Drug: Marijuana. She reports that she does not drink alcohol.  Allergies  Allergen Reactions   Doxycycline Anaphylaxis and Hives   Hydralazine Shortness Of Breath and Rash    Patient reports "couldn't breath"   Aspirin Other (See Comments)    Internal bleeding- MD SAID to not take this   Hydrocodone Nausea And Vomiting   Ibuprofen Other (See Comments)    Caused internal bleeding   Tylenol [Acetaminophen] Other (See Comments)    MD told the patient to not take this   Iohexol Itching and Other (See Comments)    Pt has itching nose after iv contrast injection    Family History  Problem Relation Age of Onset   Other Mother        many bowel obstructions   Heart disease Mother    Colon polyps Mother    Kidney cancer Father    Bone cancer Father    Diabetes Father    Heart disease Father    Heart disease Brother    Colon cancer Paternal Grandfather    Diabetes Daughter    Esophageal cancer Neg Hx    Rectal cancer Neg Hx    Stomach cancer Neg Hx     Prior to  Admission medications   Medication Sig Start Date End Date Taking? Authorizing Provider  ALPRAZolam Duanne Moron) 1 MG tablet Take 0.5-1 tablets by mouth See admin instructions. Take 1 mg by mouth at bedtime and an additional 0.5-1 mg once a day as needed for anxiety 04/15/17  Yes [provider]  amLODipine (NORVASC) 10 MG tablet TAKE 1 TABLET BY MOUTH ONCE DAILY Patient taking differently: Take 10 mg by mouth daily. 04/13/21  Yes Cantwell, Celeste C, PA-C  atorvastatin (LIPITOR) 80 MG tablet Take 80 mg by mouth at bedtime.   Yes [provider]  calcium acetate (PHOSLO) 667 MG capsule Take 2,001 mg by mouth See admin instructions. Take 2001mg  (3 capsules) by mouth three times a day with meals  and 667mg  (1 capsule) with a snack   Yes [provider]  ezetimibe (ZETIA) 10 MG tablet TAKE 1 TABLET BY MOUTH ONCE A DAY Patient taking differently: Take 10 mg by mouth daily. 05/03/21  Yes Cantwell, Celeste C, PA-C  isosorbide mononitrate (IMDUR) 60 MG 24 hr tablet Take 1 tablet (60 mg total) by mouth daily. 06/14/21 09/12/21 Yes Dahal, Marlowe Aschoff, MD  labetalol (NORMODYNE) 100 MG tablet Take 100 mg by mouth 2 (two) times daily.   Yes [provider]  LINZESS 72 MCG capsule TAKE 1 CAPSULE BY MOUTH ONCE DAILY BEFORE BREAKFAST Patient taking differently: Take 72 mcg by mouth daily before breakfast. 08/03/21  Yes Levin Erp, PA  megestrol (MEGACE) 40 MG tablet Take 40 mg by mouth daily.   Yes [provider]  mometasone-formoterol (DULERA) 100-5 MCG/ACT AERO Take 2 puffs first thing in am and then another 2 puffs about 12 hours later. Patient taking differently: Inhale 2 puffs into the lungs every 12 (twelve) hours. 04/26/17  Yes Tanda Rockers, MD  nitroGLYCERIN (NITROSTAT) 0.4 MG SL tablet Place 1 tablet (0.4 mg total) under the tongue every 5 (five) minutes as needed for chest pain. 10/15/19  Yes Miquel Dunn, NP  ondansetron (ZOFRAN-ODT) 4 MG disintegrating  tablet Take 1 tablet (4 mg total) by mouth every 8 (eight) hours as needed for nausea or vomiting. 07/25/21  Yes Hayden Rasmussen, MD  oxyCODONE (OXY IR/ROXICODONE) 5 MG immediate release tablet Take 5 mg by mouth in the morning and at bedtime.   Yes [provider]  pantoprazole (PROTONIX) 40 MG tablet TAKE 1 TABLET BY MOUTH TWICE (2) DAILY Patient taking differently: Take 40 mg by mouth 2 (two) times daily. 06/08/20  Yes Adrian Prows, MD  promethazine (PHENERGAN) 25 MG tablet Take 25 mg by mouth 2 (two) times daily as needed for nausea or vomiting. 08/16/20  Yes [provider]  venlafaxine XR (EFFEXOR XR) 150 MG 24 hr capsule Take 2 capsules (300 mg total) by mouth daily with breakfast. 05/12/21  Yes Arrien, Jimmy Picket, MD  albuterol (PROVENTIL HFA;VENTOLIN HFA) 108 (90 BASE) MCG/ACT inhaler Inhale 2 puffs into the lungs every 6 (six) hours as needed for wheezing or shortness of breath (asthma). Patient not taking: Reported on 08/16/2021    [provider]  azelastine (ASTELIN) 0.1 % nasal spray Place 1 spray into both nostrils daily as needed (seasonal allergies). Patient not taking: Reported on 08/16/2021 06/24/19   [provider]  calcitRIOL (ROCALTROL) 0.5 MCG capsule Take 0.5 mcg by mouth daily. Patient not taking: No sig reported    [provider]  feeding supplement (ENSURE ENLIVE / ENSURE PLUS) LIQD Take 237 mLs by mouth daily. Patient not taking: No sig reported 06/13/21   Terrilee Croak, MD  labetalol (NORMODYNE) 200 MG tablet Take 1 tablet (200 mg total) by mouth 2 (two) times daily. Patient not taking: No sig reported 06/13/21 09/11/21  Terrilee Croak, MD    Physical Exam: Vitals:   08/16/21 0200 08/16/21 0300 08/16/21 0400 08/16/21 0600  BP: (!) 178/93 (!) 172/86 (!) 169/90 (!) 174/96  Pulse: 79 79 73 72  Resp: 16 19 19 18   Temp:      TempSrc:      SpO2: 97% 96% 91% 96%    Physical Exam Constitutional:      General: She is not  in acute distress. HENT:     Head: Normocephalic and atraumatic.  Eyes:  Extraocular Movements: Extraocular movements intact.     Conjunctiva/sclera: Conjunctivae normal.  Cardiovascular:     Rate and Rhythm: Normal rate and regular rhythm.     Pulses: Normal pulses.  Pulmonary:     Effort: Pulmonary effort is normal. No respiratory distress.     Breath sounds: Rhonchi present. No rales.  Abdominal:     General: Bowel sounds are normal. There is no distension.     Palpations: Abdomen is soft.     Tenderness: There is abdominal tenderness. There is guarding.     Comments: Bilateral lower quadrants tender to palpation  Musculoskeletal:        General: No swelling or tenderness.     Cervical back: Normal range of motion and neck supple.  Skin:    General: Skin is warm and dry.  Neurological:     General: No focal deficit present.     Mental Status: She is alert and oriented to person, place, and time.     Labs on Admission: I have personally reviewed following labs and imaging studies  CBC: Recent Labs  Lab 08/15/21 1108 08/15/21 1551 08/15/21 2358  WBC 8.7 8.6  --   NEUTROABS 7.0 6.8  --   HGB 9.4* 9.4* 8.7*  HCT 27.2* 27.4* 25.0*  MCV 89.7 89.3  --   PLT 509.0* 433*  --    Basic Metabolic Panel: Recent Labs  Lab 08/15/21 1551  NA 133*  K 3.4*  CL 100  CO2 21*  GLUCOSE 100*  BUN 47*  CREATININE 5.61*  CALCIUM 8.3*   GFR: Estimated Creatinine Clearance: 7.4 mL/min (A) (by C-G formula based on SCr of 5.61 mg/dL (H)). Liver Function Tests: No results for input(s): AST, ALT, ALKPHOS, BILITOT, PROT, ALBUMIN in the last 168 hours. No results for input(s): LIPASE, AMYLASE in the last 168 hours. No results for input(s): AMMONIA in the last 168 hours. Coagulation Profile: Recent Labs  Lab 08/15/21 2358  INR 0.9   Cardiac Enzymes: No results for input(s): CKTOTAL, CKMB, CKMBINDEX, TROPONINI in the last 168 hours. BNP (last 3 results) No results for  input(s): PROBNP in the last 8760 hours. HbA1C: No results for input(s): HGBA1C in the last 72 hours. CBG: No results for input(s): GLUCAP in the last 168 hours. Lipid Profile: No results for input(s): CHOL, HDL, LDLCALC, TRIG, CHOLHDL, LDLDIRECT in the last 72 hours. Thyroid Function Tests: No results for input(s): TSH, T4TOTAL, FREET4, T3FREE, THYROIDAB in the last 72 hours. Anemia Panel: No results for input(s): VITAMINB12, FOLATE, FERRITIN, TIBC, IRON, RETICCTPCT in the last 72 hours. Urine analysis:    Component Value Date/Time   COLORURINE YELLOW 07/25/2021 2000   APPEARANCEUR CLEAR 07/25/2021 2000   APPEARANCEUR Cloudy (A) 03/22/2021 1432   LABSPEC 1.012 07/25/2021 2000   LABSPEC 1.016 06/07/2012 1350   PHURINE 6.0 07/25/2021 2000   GLUCOSEU 50 (A) 07/25/2021 2000   GLUCOSEU Negative 06/07/2012 1350   HGBUR NEGATIVE 07/25/2021 2000   BILIRUBINUR NEGATIVE 07/25/2021 2000   BILIRUBINUR Negative 03/22/2021 1432   BILIRUBINUR Negative 06/07/2012 1350   [place] 07/25/2021 2000   PROTEINUR 100 (A) 07/25/2021 2000   UROBILINOGEN 0.2 06/28/2014 1518   NITRITE NEGATIVE 07/25/2021 2000   LEUKOCYTESUR NEGATIVE 07/25/2021 2000   LEUKOCYTESUR Negative 06/07/2012 1350    Radiological Exams on Admission: CT ABDOMEN PELVIS WO CONTRAST  Result Date: 08/16/2021 CLINICAL DATA:  Acute abdominal pain with rectal bleeding, initial encounter EXAM: CT ABDOMEN AND PELVIS WITHOUT CONTRAST TECHNIQUE: Multidetector CT imaging  of the abdomen and pelvis was performed following the standard protocol without IV contrast. COMPARISON:  07/25/2021 FINDINGS: Lower chest: Mild atelectatic changes are noted in the left lung base. Hepatobiliary: No focal liver abnormality is seen. No gallstones, gallbladder wall thickening, or biliary dilatation. Pancreas: Unremarkable. No pancreatic ductal dilatation or surrounding inflammatory changes. Spleen: Normal in size without focal abnormality.  Adrenals/Urinary Tract: Adrenal glands are within normal limits. No renal calculi or obstructive changes are seen. The bladder is partially distended. Stomach/Bowel: Mild wall thickening is noted within the colon which may be related to incomplete distension although the possibility of mild colitis could not be totally excluded given the patient's clinical history. Changes of prior appendectomy are seen. Small bowel and stomach appear within normal limits. Vascular/Lymphatic: Diffuse atherosclerotic calcifications are noted. Kissing iliac stents are seen. No significant lymphadenopathy is noted. Reproductive: Status post hysterectomy. No adnexal masses. Other: No abdominal wall hernia or abnormality. No abdominopelvic ascites. Musculoskeletal: No acute or significant osseous findings. IMPRESSION: Mild wall thickening within the colon as described. This may represent some mild colitis. No other focal abnormality is noted. Electronically Signed   By: Inez Catalina M.D.   On: 08/16/2021 01:23   DG Chest Portable 1 View  Result Date: 08/15/2021 CLINICAL DATA:  Cough and fever EXAM: PORTABLE CHEST 1 VIEW COMPARISON:  07/25/2021 FINDINGS: Increased opacity in the parahilar right lung. No pleural effusion or pneumothorax. Lungs are hyperinflated. IMPRESSION: Increased opacity in the parahilar right lung, possibly infection. Electronically Signed   By: Ulyses Jarred M.D.   On: 08/15/2021 23:44    EKG: Independently reviewed.  Ectopic atrial rhythm, no significant change since prior tracing.  Assessment/Plan Principal Problem:   Acute GI bleeding Active Problems:   Community acquired pneumonia   Chest pain   AKI (acute kidney injury) ([place])   Acute hypoxemic respiratory failure (HCC)   Symptomatic acute on chronic blood loss anemia Acute GI bleed Colitis Patient is reporting >1 wk history of melena and hematochezia.  No hematemesis. Not on any antiplatelet agents or anticoagulation. Hemodynamically stable.  Melena with gross blood noted on rectal exam and FOBT positive. Hemoglobin 9.4 >8.7, was 10.2 on 07/25/2021. CT abdomen pelvis without contrast showing findings consistent with mild colitis.  No fever or leukocytosis.  Ischemic colitis less likely given normal lactate. Colonoscopy done in July 2022 showing 8 mm polyp in the sigmoid colon and diverticulosis of the left colon. Admitted in September 2022 for GI bleed which was felt to be diverticular in nature.  Small bowel enteroscopy done at that time did not show source of bleeding. -IV Protonix 40 mg every 12 hours. Ceftriaxone and Flagyl.  Order blood cultures.  Not actively vomiting, antiemetic as needed. Pain management. Keep n.p.o. Gentle IV fluid hydration. [facility] (Dr. Tarri Glenn) consulted by ED physician. Type and screen, 1 unit PRBCs as she is symptomatic from her anemia.  Patient has given verbal consent for blood transfusion.  Serial H&H.  Addendum: Patient is symptomatic from her anemia, endorsing generalized weakness and dizziness/lightheadedness since after the onset of her GI bleed symptoms.  Acute hypoxemic respiratory failure secondary to CAP and possible acute COPD exacerbation Desatted to high 80s on room air.  Currently satting well on 2 L supplemental oxygen.  No respiratory distress.  Has rhonchi on exam.  Chest x-ray showing increased opacity in the parahilar right lung suspicious for pneumonia.  COVID and influenza PCR negative.  No fever or leukocytosis. -Ceftriaxone and azithromycin.  Order blood cultures.  Avoid steroids given GI bleed.  Start DuoNeb every 6 hours, Pulmicort neb twice daily, albuterol neb as needed.  Continue supplemental oxygen, wean as tolerated.  AKI on CKD stage V Possibly prerenal in the setting of GI bleed versus progressive renal disease.  BUN 47, creatinine 5.6, GFR 8.  Creatinine was 5.1 on 07/25/2021 but previously 3.4 on 06/13/2021.  Patient was scheduled for AV graft insertion yesterday but procedure  was canceled due to active GI bleed.  She is not anuric. No hyperkalemia or significant acidosis.  No signs of volume overload. -Gentle IV duration.  Monitor renal function and urine output.  Avoid nephrotoxic agents.  Continue Phoslo.   Hypertension Blood pressure currently elevated with systolic in the 992E to 268T. -Holding antihypertensives at this time in the setting of acute GI bleed.  Chest pain Appears atypical/right-sided and ongoing for several days.  Troponins mildly elevated but stable.  Hyperlipidemia -Continue Lipitor and Zetia  Chronic diastolic CHF Does not appear volume overloaded on exam. -Check BNP  Anxiety, depression -Continue Xanax and Effexor  DVT prophylaxis: SCDs Code Status: Patient wishes to be full code. Family Communication: No family available at this time. Disposition Plan: Status is: Inpatient  Remains inpatient appropriate because: Acute blood loss anemia, GI bleed, acute hypoxemic respiratory failure  Level of care: Level of care: Telemetry  The medical decision making on this patient was of high complexity and the patient is at high risk for clinical deterioration, therefore this is a level 3 visit.  Shela Leff MD [facility]  If 7PM-7AM, please contact night-coverage [email or web address]  08/16/2021, 7:34 AM

## 2021-08-16 NOTE — ED Notes (Signed)
Pt was not able to tolerate orthostatic vitals being done at this time RN notified.

## 2021-08-16 NOTE — ED Notes (Signed)
Pt desatting to the high 80s on Rm Air. Pt denies home oxygen use but promotes feeling short of breath. 2 liters  applied at this time and oxygen sats improved to 93%

## 2021-08-16 NOTE — ED Notes (Signed)
Pt's hgb increased from 8.7 to 9.1.  ED RN contacted MD Reola Mosher regarding blood administration. Per md Reola Mosher ed rn to hold 1st unit of blood.  Nursing communication order placed.

## 2021-08-16 NOTE — ED Notes (Signed)
MD notified and aware of patient's vitals and pain. MD called into pt's room to talk w/ pt and follow up.

## 2021-08-17 DIAGNOSIS — J189 Pneumonia, unspecified organism: Secondary | ICD-10-CM | POA: Diagnosis not present

## 2021-08-17 DIAGNOSIS — D649 Anemia, unspecified: Secondary | ICD-10-CM

## 2021-08-17 DIAGNOSIS — R195 Other fecal abnormalities: Secondary | ICD-10-CM | POA: Diagnosis not present

## 2021-08-17 DIAGNOSIS — R110 Nausea: Secondary | ICD-10-CM

## 2021-08-17 DIAGNOSIS — K922 Gastrointestinal hemorrhage, unspecified: Secondary | ICD-10-CM | POA: Diagnosis not present

## 2021-08-17 DIAGNOSIS — K921 Melena: Secondary | ICD-10-CM | POA: Diagnosis not present

## 2021-08-17 LAB — CBC
HCT: 23.7 % — ABNORMAL LOW (ref 36.0–46.0)
Hemoglobin: 8.1 g/dL — ABNORMAL LOW (ref 12.0–15.0)
MCH: 30.5 pg (ref 26.0–34.0)
MCHC: 34.2 g/dL (ref 30.0–36.0)
MCV: 89.1 fL (ref 80.0–100.0)
Platelets: 360 10*3/uL (ref 150–400)
RBC: 2.66 MIL/uL — ABNORMAL LOW (ref 3.87–5.11)
RDW: 17.2 % — ABNORMAL HIGH (ref 11.5–15.5)
WBC: 11.3 10*3/uL — ABNORMAL HIGH (ref 4.0–10.5)
nRBC: 0 % (ref 0.0–0.2)

## 2021-08-17 LAB — BASIC METABOLIC PANEL
Anion gap: 10 (ref 5–15)
BUN: 44 mg/dL — ABNORMAL HIGH (ref 6–20)
CO2: 19 mmol/L — ABNORMAL LOW (ref 22–32)
Calcium: 7.9 mg/dL — ABNORMAL LOW (ref 8.9–10.3)
Chloride: 102 mmol/L (ref 98–111)
Creatinine, Ser: 5.25 mg/dL — ABNORMAL HIGH (ref 0.44–1.00)
GFR, Estimated: 9 mL/min — ABNORMAL LOW (ref >60)
Glucose, Bld: 94 mg/dL (ref 70–99)
Potassium: 3.6 mmol/L (ref 3.5–5.1)
Sodium: 131 mmol/L — ABNORMAL LOW (ref 135–145)

## 2021-08-17 MED ORDER — LABETALOL HCL 100 MG PO TABS
100.0000 mg | ORAL_TABLET | Freq: Two times a day (BID) | ORAL | Status: DC
  Administered 2021-08-17 – 2021-08-22 (×11): 100 mg via ORAL
  Filled 2021-08-17 (×12): qty 1

## 2021-08-17 MED ORDER — AMLODIPINE BESYLATE 10 MG PO TABS
10.0000 mg | ORAL_TABLET | Freq: Every day | ORAL | Status: DC
  Administered 2021-08-17 – 2021-08-22 (×6): 10 mg via ORAL
  Filled 2021-08-17 (×6): qty 1

## 2021-08-17 MED ORDER — ISOSORBIDE MONONITRATE ER 60 MG PO TB24
60.0000 mg | ORAL_TABLET | Freq: Every day | ORAL | Status: DC
  Administered 2021-08-17 – 2021-08-22 (×6): 60 mg via ORAL
  Filled 2021-08-17 (×6): qty 1

## 2021-08-17 MED ORDER — PROMETHAZINE HCL 25 MG PO TABS
25.0000 mg | ORAL_TABLET | Freq: Four times a day (QID) | ORAL | Status: DC | PRN
  Administered 2021-08-17 (×2): 25 mg via ORAL
  Filled 2021-08-17 (×2): qty 1

## 2021-08-17 NOTE — Progress Notes (Signed)
Attempted to ambulate patient without oxygen. Patient sat on bed and noted to have desatted down to 84 immediately on room air. 2L oxygen via nasal canula reapplied. Pt noted to be SOB. Advised to reattempt later today per MD.

## 2021-08-17 NOTE — Progress Notes (Signed)
PROGRESS NOTE    [name]  HFW:[id number] DOB: 04/13/1961 DOA: 08/15/2021 PCP: Bernerd Limbo, MD   Brief Narrative:  [name] is a 60 y.o. female with medical history significant of hypertension, hyperlipidemia, chronic diastolic CHF, CAD, aortic stenosis status post stent, COPD, tobacco use, PAD status post aortic stent/SMA stent/bilateral common iliac artery stent, CKD stage V, chronic pain, anxiety, depression, chronic mesenteric ischemia, history of GI bleed with recent admission in September 2022, chronic blood loss anemia. Patient was scheduled for AV graft insertion yesterday but procedure was canceled due to active GI bleed (complaints of melena and hematochezia). Patient states she is having blood in her stool for over a week. She describes it as very dark stool with bright red blood mixed in.  She is having bilateral lower quadrant abdominal pain. She is vomiting but has not vomited any blood. Denies NSAID or alcohol use. States she is not taking any blood thinners. She is also experiencing shortness of breath, wheezing, cough, and right-sided chest pain.  She is using her COPD inhalers Dulera and albuterol.  Assessment & Plan:   Acute hypoxemic respiratory failure secondary to questionable CAP versus less likely acute COPD exacerbation -Hypoxic to room air at intake in the 80s - remains moderately hypoxic even at rest today sats at 86 to 87% -Continue to wean oxygen as tolerated; ambulatory oxygen screen ongoing -Questionable right lower lung field opacification concerning for aspiration versus pneumonia - continue ceftriaxone azithromycin x5 days  -Hold steroids in the setting of GI bleed, presumed -COVID/influenza negative at intake  Symptomatic acute on chronic blood loss anemia Presumed acute GI bleed Colitis -Patient reporting more than 1 week of melena and hematochezia with known history of diverticulosis -patient has no lower abdominal pain but complains of  right shoulder pain -Admitted for GI follow-up, tentative plan for upper endoscopy in the next 24 hours pending clinical course -FOBT positive at intake, hemoglobin downtrending slowly with IV fluids  AKI on CKD stage V -Likely prerenal in setting of above with poor p.o. intake -Plan for AV graft insertion on the 14th held up due to presumed active GI bleed as above -Follow closely, if kidney function continues to deteriorate may need to transition to [facility] main hospital for nephrology consult and discussion for dialysis, currently able to tolerate IV fluids, creatinine somewhat downtrending appropriately   Hypertension -Resume home medications including amlodipine, isosorbide, labetalol   Chest pain, atypical, right-sided, without provocation or alleviating factors Concern for pain seeking behavior -In "intractable shoulder pain" yesterday not complaining of rib pain or right upper quadrant abdominal pain requesting specifically Dilaudid IV only refusing further p.o. analgesics  -Telemetry and EKG unremarkable repeat chest x-ray pending -Troponin flat, not consistent with ACS, EKG without acute ST changes -Patient asleep today after single dose of home Xanax, no indication for IV narcotics  Hyperlipidemia -Continue Lipitor and Zetia   Chronic diastolic CHF Does not appear volume overloaded on exam. BNP elevated in setting of decreased kidney function/CKD   Anxiety, depression -Continue Xanax and Effexor   DVT prophylaxis: SCDs Code Status: Full Family Communication: None available  Status is: inpt  Dispo: The patient is from: home              Anticipated d/c is to: home              Anticipated d/c date is: 24-48h              Patient currently NOT medically  stable for discharge  Consultants:  GI  Procedures:  Endoscopy 08/17/21  Antimicrobials:  none   Subjective: No acute issues/events - continues to transiently complain of R shoulder pain, ROS otherwise  unremarkable this am - overtly denies BRBPR or dark stool this am  Objective: Vitals:   08/16/21 2023 08/16/21 2122 08/16/21 2353 08/17/21 0341  BP:  (!) 168/85 (!) 160/85 (!) 168/88  Pulse: 98  90 87  Resp: 20  20 20   Temp: 98.1 F (36.7 C)  97.8 F (36.6 C) 98.4 F (36.9 C)  TempSrc: Oral  Oral Oral  SpO2: 92%  97% 95%    Intake/Output Summary (Last 24 hours) at 08/17/2021 0804 Last data filed at 08/16/2021 1636 Gross per 24 hour  Intake 651.77 ml  Output --  Net 651.77 ml    There were no vitals filed for this visit.  Examination:  General exam: Appears calm and comfortable  Respiratory system: Clear to auscultation. Respiratory effort normal. Cardiovascular system: S1 & S2 heard, RRR. No JVD, murmurs, rubs, gallops or clicks. No pedal edema. Gastrointestinal system: Abdomen is nondistended, soft and nontender. No organomegaly or masses felt. Normal bowel sounds heard. Central nervous system: Alert and oriented. No focal neurological deficits. Extremities: Symmetric 5 x 5 power. Skin: No rashes, lesions or ulcers Psychiatry: Judgement and insight appear normal. Mood & affect appropriate.     Data Reviewed: I have personally reviewed following labs and imaging studies  CBC: Recent Labs  Lab 08/15/21 1108 08/15/21 1551 08/15/21 2358 08/16/21 0740 08/16/21 1055 08/17/21 0453  WBC 8.7 8.6  --   --   --  11.3*  NEUTROABS 7.0 6.8  --   --   --   --   HGB 9.4* 9.4* 8.7* 9.1* 8.5* 8.1*  HCT 27.2* 27.4* 25.0* 25.9* 25.2* 23.7*  MCV 89.7 89.3  --   --   --  89.1  PLT 509.0* 433*  --   --   --  101    Basic Metabolic Panel: Recent Labs  Lab 08/15/21 1551 08/16/21 0740 08/17/21 0453  NA 133* 132* 131*  K 3.4* 3.5 3.6  CL 100 99 102  CO2 21* 20* 19*  GLUCOSE 100* 88 94  BUN 47* 44* 44*  CREATININE 5.61* 5.43* 5.25*  CALCIUM 8.3* 8.3* 7.9*    GFR: Estimated Creatinine Clearance: 7.9 mL/min (A) (by C-G formula based on SCr of 5.25 mg/dL (H)). Liver  Function Tests: No results for input(s): AST, ALT, ALKPHOS, BILITOT, PROT, ALBUMIN in the last 168 hours. No results for input(s): LIPASE, AMYLASE in the last 168 hours. No results for input(s): AMMONIA in the last 168 hours. Coagulation Profile: Recent Labs  Lab 08/15/21 2358  INR 0.9    Cardiac Enzymes: No results for input(s): CKTOTAL, CKMB, CKMBINDEX, TROPONINI in the last 168 hours. BNP (last 3 results) No results for input(s): PROBNP in the last 8760 hours. HbA1C: No results for input(s): HGBA1C in the last 72 hours. CBG: No results for input(s): GLUCAP in the last 168 hours. Lipid Profile: No results for input(s): CHOL, HDL, LDLCALC, TRIG, CHOLHDL, LDLDIRECT in the last 72 hours. Thyroid Function Tests: No results for input(s): TSH, T4TOTAL, FREET4, T3FREE, THYROIDAB in the last 72 hours. Anemia Panel: Recent Labs    08/16/21 1055  FERRITIN 161  TIBC 233*  IRON 61   Sepsis Labs: Recent Labs  Lab 08/15/21 2358  LATICACIDVEN 0.5     Recent Results (from the past 240 hour(s))  Resp  Panel by RT-PCR (Flu A&B, Covid) Nasopharyngeal Swab     Status: None   Collection Time: 08/16/21  3:51 AM   Specimen: Nasopharyngeal Swab; Nasopharyngeal(NP) swabs in vial transport medium  Result Value Ref Range Status   SARS Coronavirus 2 by RT PCR NEGATIVE NEGATIVE Final    Comment: (NOTE) SARS-CoV-2 target nucleic acids are NOT DETECTED.  The SARS-CoV-2 RNA is generally detectable in upper respiratory specimens during the acute phase of infection. The lowest concentration of SARS-CoV-2 viral copies this assay can detect is 138 copies/mL. A negative result does not preclude SARS-Cov-2 infection and should not be used as the sole basis for treatment or other patient management decisions. A negative result may occur with  improper specimen collection/handling, submission of specimen other than nasopharyngeal swab, presence of viral mutation(s) within the areas targeted by this  assay, and inadequate number of viral copies(<138 copies/mL). A negative result must be combined with clinical observations, patient history, and epidemiological information. The expected result is Negative.  Fact Sheet for Patients:  EntrepreneurPulse.com.au  Fact Sheet for Healthcare Providers:  IncredibleEmployment.be  This test is no t yet approved or cleared by the [place] FDA and  has been authorized for detection and/or diagnosis of SARS-CoV-2 by FDA under an Emergency Use Authorization (EUA). This EUA will remain  in effect (meaning this test can be used) for the duration of the COVID-19 declaration under Section 564(b)(1) of the Act, 21 U.S.C.section 360bbb-3(b)(1), unless the authorization is terminated  or revoked sooner.       Influenza A by PCR NEGATIVE NEGATIVE Final   Influenza B by PCR NEGATIVE NEGATIVE Final    Comment: (NOTE) The Xpert Xpress SARS-CoV-2/FLU/RSV plus assay is intended as an aid in the diagnosis of influenza from Nasopharyngeal swab specimens and should not be used as a sole basis for treatment. Nasal washings and aspirates are unacceptable for Xpert Xpress SARS-CoV-2/FLU/RSV testing.  Fact Sheet for Patients: EntrepreneurPulse.com.au  Fact Sheet for Healthcare Providers: IncredibleEmployment.be  This test is not yet approved or cleared by the [place] FDA and has been authorized for detection and/or diagnosis of SARS-CoV-2 by FDA under an Emergency Use Authorization (EUA). This EUA will remain in effect (meaning this test can be used) for the duration of the COVID-19 declaration under Section 564(b)(1) of the Act, 21 U.S.C. section 360bbb-3(b)(1), unless the authorization is terminated or revoked.  Performed at [facility], [place] [address], [place], [place] [address]   Culture, blood (routine x 2)     Status: None (Preliminary result)    Collection Time: 08/16/21  7:15 AM   Specimen: BLOOD  Result Value Ref Range Status   Specimen Description   Final    BLOOD LEFT ANTECUBITAL Performed at [facility] [address], [place], [place] [address]    Special Requests   Final    BOTTLES DRAWN AEROBIC AND ANAEROBIC Blood Culture results may not be optimal due to an inadequate volume of blood received in culture bottles Performed at [facility] [address] [place], [place] [address]    Culture   Final    NO GROWTH <12 HOURS Performed at [facility] [address], [place],  [address]    Report Status PENDING  Incomplete  Culture, blood (routine x 2)     Status: None (Preliminary result)   Collection Time: 08/16/21  7:40 AM   Specimen: BLOOD  Result Value Ref Range Status   Specimen  Description   Final    BLOOD LEFT ANTECUBITAL Performed at [facility] [address], [place], [place] [address]    Special Requests   Final    BOTTLES DRAWN AEROBIC AND ANAEROBIC Blood Culture results may not be optimal due to an inadequate volume of blood received in culture bottles Performed at [facility] [address], [place], [place] [address]    Culture   Final    NO GROWTH <12 HOURS Performed at [facility] [address], [place], [place] [address]    Report Status PENDING  Incomplete          Radiology Studies: CT ABDOMEN PELVIS WO CONTRAST  Result Date: 08/16/2021 CLINICAL DATA:  Acute abdominal pain with rectal bleeding, initial encounter EXAM: CT ABDOMEN AND PELVIS WITHOUT CONTRAST TECHNIQUE: Multidetector CT imaging of the abdomen and pelvis was performed following the standard protocol without IV contrast. COMPARISON:  07/25/2021 FINDINGS: Lower chest: Mild atelectatic changes are noted in the left lung base. Hepatobiliary: No focal liver abnormality is seen. No gallstones, gallbladder wall thickening, or  biliary dilatation. Pancreas: Unremarkable. No pancreatic ductal dilatation or surrounding inflammatory changes. Spleen: Normal in size without focal abnormality. Adrenals/Urinary Tract: Adrenal glands are within normal limits. No renal calculi or obstructive changes are seen. The bladder is partially distended. Stomach/Bowel: Mild wall thickening is noted within the colon which may be related to incomplete distension although the possibility of mild colitis could not be totally excluded given the patient's clinical history. Changes of prior appendectomy are seen. Small bowel and stomach appear within normal limits. Vascular/Lymphatic: Diffuse atherosclerotic calcifications are noted. Kissing iliac stents are seen. No significant lymphadenopathy is noted. Reproductive: Status post hysterectomy. No adnexal masses. Other: No abdominal wall hernia or abnormality. No abdominopelvic ascites. Musculoskeletal: No acute or significant osseous findings. IMPRESSION: Mild wall thickening within the colon as described. This may represent some mild colitis. No other focal abnormality is noted. Electronically Signed   By: Inez Catalina M.D.   On: 08/16/2021 01:23   DG Chest Port 1 View  Result Date: 08/16/2021 CLINICAL DATA:  Chest pain. EXAM: PORTABLE CHEST 1 VIEW COMPARISON:  August 15, 2021. FINDINGS: Stable cardiomegaly. No pneumothorax or pleural effusion is noted. Left lung is clear. Stable right infrahilar opacity is noted concerning for atelectasis or infiltrate. Bony thorax is unremarkable. IMPRESSION: Stable right infrahilar opacity is noted concerning for atelectasis or possibly infiltrate. Electronically Signed   By: Marijo Conception M.D.   On: 08/16/2021 16:12   DG Chest Portable 1 View  Result Date: 08/15/2021 CLINICAL DATA:  Cough and fever EXAM: PORTABLE CHEST 1 VIEW COMPARISON:  07/25/2021 FINDINGS: Increased opacity in the parahilar right lung. No pleural effusion or pneumothorax. Lungs are  hyperinflated. IMPRESSION: Increased opacity in the parahilar right lung, possibly infection. Electronically Signed   By: Ulyses Jarred M.D.   On: 08/15/2021 23:44    Scheduled Meds:  sodium chloride   Intravenous Once   ALPRAZolam  1 mg Oral QHS   atorvastatin  80 mg Oral QHS   budesonide (PULMICORT) nebulizer solution  0.25 mg Nebulization BID   calcium acetate  2,001 mg Oral TID with meals   ezetimibe  10 mg Oral Daily   ipratropium-albuterol  3 mL Nebulization TID   pantoprazole (PROTONIX) IV  40 mg Intravenous Q12H   venlafaxine XR  300 mg Oral Q breakfast   Continuous Infusions:  azithromycin 500 mg (08/16/21 2243)   cefTRIAXone (  ROCEPHIN)  IV 1 g (08/16/21 2131)   metronidazole 500 mg (08/16/21 2359)     LOS: 1 day   Time spent: 34min  Lillymae Duet C Demetria Iwai, DO [facility]  If 7PM-7AM, please contact night-coverage [email or web address]  08/17/2021, 8:04 AM

## 2021-08-17 NOTE — Progress Notes (Signed)
[facility] Progress Note  CC:  Acute on chronic GI  bleed  Subjective: She remains on oxygen 2L [place], congested nonproductive cough. She has mild SOB which she feels has slightly improved today. No chest pain but specifically complains of pain across the left to right clavicle and neck area. She passed a small formed brown black stool which oozed a little red blood last night. BP 189/89 this am, to restart Amlodipine and Labetalol po now.    Objective:  Vital signs in last 24 hours: Temp:  [97.7 F (36.5 C)-98.4 F (36.9 C)] 98.4 F (36.9 C) (11/17 0341) Pulse Rate:  [78-98] 87 (11/17 0341) Resp:  [14-24] 20 (11/17 0341) BP: (121-184)/(85-106) 168/88 (11/17 0341) SpO2:  [90 %-98 %] 95 % (11/17 0341) Last BM Date: 08/16/21 General:   Alert 60 year old female in NAD. Heart: RRR, systolic murmur.  Chest: No crepitus.  Pulm: Coarse expiratory wheezes throughout, R > L. On oxygen 2L [place].  Abdomen: Soft, nontender. Nondistended. Midline scar intact. No mass. + BS x 4 quads.  Extremities:  Without edema. Neurologic:  Alert and  oriented x 4. Speech is clear. Moves all extremities equally. Psych:  Alert and cooperative. Normal mood and affect.  Intake/Output from previous day: 11/16 0701 - 11/17 0700 In: 651.8 [I.V.:552.7; IV Piggyback:99] Out: -  Intake/Output this shift: No intake/output data recorded.  Lab Results: Recent Labs    08/15/21 1108 08/15/21 1551 08/15/21 2358 08/16/21 0740 08/16/21 1055 08/17/21 0453  WBC 8.7 8.6  --   --   --  11.3*  HGB 9.4* 9.4*   < > 9.1* 8.5* 8.1*  HCT 27.2* 27.4*   < > 25.9* 25.2* 23.7*  PLT 509.0* 433*  --   --   --  360   < > = values in this interval not displayed.   BMET Recent Labs    08/15/21 1551 08/16/21 0740 08/17/21 0453  NA 133* 132* 131*  K 3.4* 3.5 3.6  CL 100 99 102  CO2 21* 20* 19*  GLUCOSE 100* 88 94  BUN 47* 44* 44*  CREATININE 5.61* 5.43* 5.25*  CALCIUM 8.3* 8.3* 7.9*   LFT No results  for input(s): PROT, ALBUMIN, AST, ALT, ALKPHOS, BILITOT, BILIDIR, IBILI in the last 72 hours. PT/INR Recent Labs    08/15/21 2358  LABPROT 12.4  INR 0.9   Hepatitis Panel No results for input(s): HEPBSAG, HCVAB, HEPAIGM, HEPBIGM in the last 72 hours.  CT ABDOMEN PELVIS WO CONTRAST  Result Date: 08/16/2021 CLINICAL DATA:  Acute abdominal pain with rectal bleeding, initial encounter EXAM: CT ABDOMEN AND PELVIS WITHOUT CONTRAST TECHNIQUE: Multidetector CT imaging of the abdomen and pelvis was performed following the standard protocol without IV contrast. COMPARISON:  07/25/2021 FINDINGS: Lower chest: Mild atelectatic changes are noted in the left lung base. Hepatobiliary: No focal liver abnormality is seen. No gallstones, gallbladder wall thickening, or biliary dilatation. Pancreas: Unremarkable. No pancreatic ductal dilatation or surrounding inflammatory changes. Spleen: Normal in size without focal abnormality. Adrenals/Urinary Tract: Adrenal glands are within normal limits. No renal calculi or obstructive changes are seen. The bladder is partially distended. Stomach/Bowel: Mild wall thickening is noted within the colon which may be related to incomplete distension although the possibility of mild colitis could not be totally excluded given the patient's clinical history. Changes of prior appendectomy are seen. Small bowel and stomach appear within normal limits. Vascular/Lymphatic: Diffuse atherosclerotic calcifications are noted. Kissing iliac stents are seen. No significant  lymphadenopathy is noted. Reproductive: Status post hysterectomy. No adnexal masses. Other: No abdominal wall hernia or abnormality. No abdominopelvic ascites. Musculoskeletal: No acute or significant osseous findings. IMPRESSION: Mild wall thickening within the colon as described. This may represent some mild colitis. No other focal abnormality is noted. Electronically Signed   By: Inez Catalina M.D.   On: 08/16/2021 01:23   DG  Chest Port 1 View  Result Date: 08/16/2021 CLINICAL DATA:  Chest pain. EXAM: PORTABLE CHEST 1 VIEW COMPARISON:  August 15, 2021. FINDINGS: Stable cardiomegaly. No pneumothorax or pleural effusion is noted. Left lung is clear. Stable right infrahilar opacity is noted concerning for atelectasis or infiltrate. Bony thorax is unremarkable. IMPRESSION: Stable right infrahilar opacity is noted concerning for atelectasis or possibly infiltrate. Electronically Signed   By: Marijo Conception M.D.   On: 08/16/2021 16:12   DG Chest Portable 1 View  Result Date: 08/15/2021 CLINICAL DATA:  Cough and fever EXAM: PORTABLE CHEST 1 VIEW COMPARISON:  07/25/2021 FINDINGS: Increased opacity in the parahilar right lung. No pleural effusion or pneumothorax. Lungs are hyperinflated. IMPRESSION: Increased opacity in the parahilar right lung, possibly infection. Electronically Signed   By: Ulyses Jarred M.D.   On: 08/15/2021 23:44    Assessment / Plan:  2) 60 year old female with a history of mesenteric ischemia, diverticular bleed, small bowel AVMs admitted to the hospital 08/15/2021 with acute on chronic anemia, melena and hematochezia. CTAP without contrast showing possible mild colitis. Colonoscopy July 2022 with a polyp and diverticulosis. Prior hospital admission September 2022 for GI bleed felt to be diverticular, small bowel enteroscopy done with no source of bleeding (previous enteroscopy with AVMs in the duodenum/jejunum treated with APC in 2021). Admission Hg 9.1 (baseline Hg 10.2) -> 8.5 -> today Hg 8.1. Normal iron levels. She passed a small brown/black stool which oozed a small amount of red blood x 1 last night. BP 189/89 today, Amlodipine and Norvasc po ordered by the hospitalist.  -Cancel small bowel enteroscopy today -Tentatively reschedule small bowel enteroscopy tomorrow at 1pm -Soft diet -NPO after midnight -Continue to monitor H/H Q 12 hrs next 24 hours -Transfuse for Hg < 8 -Continue Pantoprazole  40mg  IV bid -Further recommendations per Dr. Hilarie Fredrickson  2)  Acute hypoxemic respiratory failure secondary to suspected CAP and possible acute COPD exacerbation. CXR showed possible right lobe pneumonia. WBC 11.3. On Azithromycin, Ceftriaxone and Flagyl IV. She continues to have mild SOB. Coarse exp wheezes on exam this am.   3) AKI on CKD stage V. Creatinine 5.25 (5.1 on 07/25/2021), she as scheduled for AV graft insertion 11/15 but this procedure was canceled due to active GI bleeding   4) Chronic diastolic CHF  5) Hyponatremia. NA 131.   6) CAD. Aortic stenosis. LV EF 60%.   7) PAD status post aortic stent/SMA stent/bilateral common iliac artery stent   Principal Problem:   Acute GI bleeding Active Problems:   Community acquired pneumonia   Chest pain   AKI (acute kidney injury) ([place])   Acute hypoxemic respiratory failure ([place])     LOS: 1 day   Noralyn Pick  08/17/2021, 08:44 AM

## 2021-08-17 NOTE — Progress Notes (Signed)
Patients blood pressure noted to be 182/89. No PRNs noted. MD paged and notified. Awaiting provider response. No new orders at this time.

## 2021-08-17 NOTE — Plan of Care (Signed)

## 2021-08-18 ENCOUNTER — Inpatient Hospital Stay (HOSPITAL_COMMUNITY): Payer: Medicare Other | Admitting: Registered Nurse

## 2021-08-18 ENCOUNTER — Encounter (HOSPITAL_COMMUNITY): Payer: Self-pay | Admitting: Internal Medicine

## 2021-08-18 ENCOUNTER — Encounter (HOSPITAL_COMMUNITY)
Admission: EM | Disposition: A | Discharge status: Home / Self Care | Payer: Self-pay | Source: Ambulatory Visit | Attending: Internal Medicine

## 2021-08-18 ENCOUNTER — Other Ambulatory Visit: Payer: Self-pay

## 2021-08-18 ENCOUNTER — Inpatient Hospital Stay (HOSPITAL_COMMUNITY): Payer: Medicare Other

## 2021-08-18 DIAGNOSIS — R195 Other fecal abnormalities: Secondary | ICD-10-CM | POA: Diagnosis not present

## 2021-08-18 DIAGNOSIS — R110 Nausea: Secondary | ICD-10-CM

## 2021-08-18 DIAGNOSIS — K921 Melena: Secondary | ICD-10-CM

## 2021-08-18 DIAGNOSIS — J189 Pneumonia, unspecified organism: Secondary | ICD-10-CM

## 2021-08-18 DIAGNOSIS — D649 Anemia, unspecified: Secondary | ICD-10-CM

## 2021-08-18 DIAGNOSIS — K922 Gastrointestinal hemorrhage, unspecified: Secondary | ICD-10-CM | POA: Diagnosis not present

## 2021-08-18 HISTORY — PX: ENTEROSCOPY: SHX5533

## 2021-08-18 HISTORY — PX: GIVENS CAPSULE STUDY: SHX5432

## 2021-08-18 LAB — CBC
HCT: 21.3 % — ABNORMAL LOW (ref 36.0–46.0)
Hemoglobin: 7.1 g/dL — ABNORMAL LOW (ref 12.0–15.0)
MCH: 30.5 pg (ref 26.0–34.0)
MCHC: 33.3 g/dL (ref 30.0–36.0)
MCV: 91.4 fL (ref 80.0–100.0)
Platelets: 318 10*3/uL (ref 150–400)
RBC: 2.33 MIL/uL — ABNORMAL LOW (ref 3.87–5.11)
RDW: 17 % — ABNORMAL HIGH (ref 11.5–15.5)
WBC: 7.8 10*3/uL (ref 4.0–10.5)
nRBC: 0 % (ref 0.0–0.2)

## 2021-08-18 LAB — BASIC METABOLIC PANEL
Anion gap: 8 (ref 5–15)
BUN: 48 mg/dL — ABNORMAL HIGH (ref 6–20)
CO2: 21 mmol/L — ABNORMAL LOW (ref 22–32)
Calcium: 7.6 mg/dL — ABNORMAL LOW (ref 8.9–10.3)
Chloride: 103 mmol/L (ref 98–111)
Creatinine, Ser: 5.69 mg/dL — ABNORMAL HIGH (ref 0.44–1.00)
GFR, Estimated: 8 mL/min — ABNORMAL LOW (ref >60)
Glucose, Bld: 70 mg/dL (ref 70–99)
Potassium: 3.7 mmol/L (ref 3.5–5.1)
Sodium: 132 mmol/L — ABNORMAL LOW (ref 135–145)

## 2021-08-18 LAB — HEMOGLOBIN AND HEMATOCRIT, BLOOD
HCT: 27.5 % — ABNORMAL LOW (ref 36.0–46.0)
Hemoglobin: 9.3 g/dL — ABNORMAL LOW (ref 12.0–15.0)

## 2021-08-18 LAB — PREPARE RBC (CROSSMATCH)

## 2021-08-18 SURGERY — ENTEROSCOPY
Anesthesia: Monitor Anesthesia Care

## 2021-08-18 MED ORDER — SODIUM CHLORIDE 0.9 % IV SOLN: INTRAVENOUS | Status: DC | PRN

## 2021-08-18 MED ORDER — LIDOCAINE 2% (20 MG/ML) 5 ML SYRINGE
INTRAMUSCULAR | Status: DC | PRN
  Administered 2021-08-18: 20 mg via INTRAVENOUS

## 2021-08-18 MED ORDER — PROPOFOL 10 MG/ML IV BOLUS
INTRAVENOUS | Status: AC
  Filled 2021-08-18: qty 20

## 2021-08-18 MED ORDER — SODIUM CHLORIDE 0.9% IV SOLUTION: Freq: Once | INTRAVENOUS | Status: AC

## 2021-08-18 MED ORDER — PHENYLEPHRINE HCL (PRESSORS) 10 MG/ML IV SOLN
INTRAVENOUS | Status: AC
  Filled 2021-08-18: qty 2

## 2021-08-18 MED ORDER — PROPOFOL 10 MG/ML IV BOLUS
INTRAVENOUS | Status: DC | PRN
  Administered 2021-08-18: 20 mg via INTRAVENOUS

## 2021-08-18 MED ORDER — PROPOFOL 500 MG/50ML IV EMUL
INTRAVENOUS | Status: DC | PRN
  Administered 2021-08-18: 120 ug/kg/min via INTRAVENOUS

## 2021-08-18 MED ORDER — GLUCAGON HCL RDNA (DIAGNOSTIC) 1 MG IJ SOLR
INTRAMUSCULAR | Status: DC | PRN
  Administered 2021-08-18: .5 mg via INTRAVENOUS

## 2021-08-18 MED ORDER — PROPOFOL 500 MG/50ML IV EMUL
INTRAVENOUS | Status: AC
  Filled 2021-08-18: qty 50

## 2021-08-18 MED ORDER — PANTOPRAZOLE SODIUM 40 MG PO TBEC
40.0000 mg | DELAYED_RELEASE_TABLET | Freq: Every day | ORAL | Status: DC
  Administered 2021-08-19 – 2021-08-22 (×4): 40 mg via ORAL
  Filled 2021-08-18 (×4): qty 1

## 2021-08-18 SURGICAL SUPPLY — 1 items: TOWEL COTTON PACK 4EA (MISCELLANEOUS) ×4 IMPLANT

## 2021-08-18 NOTE — Care Management Important Message (Signed)
Important Message  Patient Details IM Letter given to the Patient. Name: [name] MRN: [id number] Date of Birth: Nov 14, 1960   Medicare Important Message Given:  Yes     Kerin Salen 08/18/2021, 12:16 PM

## 2021-08-18 NOTE — Progress Notes (Signed)
NEW ADMISSION NOTE New Admission Note:   Arrival Method: stretcher Mental Orientation: A&O X 3 Telemetry: 5M15 Assessment: Completed Skin: intact dry/flaky IV:RFA RAC Pain:9/10 Tubes: NONE Safety Measures: Safety Fall Prevention Plan has been given, discussed and signed Admission: Completed [facility] Orientation: Patient has been orientated to the room, unit and staff.  Family: none  Orders have been reviewed and implemented. Will continue to monitor the patient. Call light has been placed within reach and bed alarm has been activated.   Mohannad Olivero S Jasmane Brockway, RN

## 2021-08-18 NOTE — Anesthesia Postprocedure Evaluation (Signed)
Anesthesia Post Note  Patient: [name]  Procedure(s) Performed: ENTEROSCOPY GIVENS CAPSULE Possible. Have Delivery Device available     Patient location during evaluation: Endoscopy Anesthesia Type: MAC Level of consciousness: awake and alert Pain management: pain level controlled Vital Signs Assessment: post-procedure vital signs reviewed and stable Respiratory status: spontaneous breathing, nonlabored ventilation and respiratory function stable Cardiovascular status: blood pressure returned to baseline and stable Postop Assessment: no apparent nausea or vomiting Anesthetic complications: no   No notable events documented.  Last Vitals:  Vitals:   08/18/21 1420 08/18/21 1431  BP: 138/63 135/65  Pulse: 64 (!) 59  Resp: 18 16  Temp:    SpO2: 92% 94%    Last Pain:  Vitals:   08/18/21 1431  TempSrc:   PainSc: 0-No pain                 Lidia Collum

## 2021-08-18 NOTE — Progress Notes (Signed)
PROGRESS NOTE    [name]  WCB:[id number] DOB: Dec 27, 1960 DOA: 08/15/2021 PCP: Bernerd Limbo, MD   Brief Narrative:  [name] is a 60 y.o. female with medical history significant of hypertension, hyperlipidemia, chronic diastolic CHF, CAD, aortic stenosis status post stent, COPD, tobacco use, PAD status post aortic stent/SMA stent/bilateral common iliac artery stent, CKD stage V, chronic pain, anxiety, depression, chronic mesenteric ischemia, history of GI bleed with recent admission in September 2022, chronic blood loss anemia. Patient was scheduled for AV graft insertion yesterday but procedure was canceled due to active GI bleed (complaints of melena and hematochezia). Patient states she is having blood in her stool for over a week. She describes it as very dark stool with bright red blood mixed in.  She is having bilateral lower quadrant abdominal pain. She is vomiting but has not vomited any blood. Denies NSAID or alcohol use. States she is not taking any blood thinners. She is also experiencing shortness of breath, wheezing, cough, and right-sided chest pain.  She is using her COPD inhalers Dulera and albuterol.  Assessment & Plan:  Acute hypoxemic respiratory failure secondary to questionable CAP versus less likely acute COPD exacerbation -Hypoxic to room air at intake in the 80s - remains moderately hypoxic even at rest today sats at 86 to 87% -Repeat imaging shows mild bilateral effusions - unable to diurese given kidney function as below -Continue to wean oxygen as tolerated; ambulatory oxygen screen ongoing -Questionable right lower lung field opacification concerning for aspiration versus pneumonia - continue ceftriaxone azithromycin x5 days  -Hold steroids in the setting of GI bleed, presumed -COVID/influenza negative at intake  Symptomatic acute on chronic blood loss anemia Presumed acute GI bleed Concurrent colitis -Patient reporting more than 1 week of melena  and hematochezia with known history of diverticulosis -patient has no lower abdominal pain but complains of right shoulder pain -Admitted for GI follow-up, endoscopy(upper) today unremarkable - capsule endoscopy deployed -FOBT positive at intake, hemoglobin downtrending slowly with IV fluids -1u PRBC 08/18/21 given h/h downtrending  AKI on CKD stage V -Likely prerenal in setting of above with poor p.o. intake and now symptomatic anemia -Plan for AV graft insertion on the 14th held up due to presumed active GI bleed as above -Nephrology following now - requesting transfer to [facility] for dialysis catheter placement and likely initiation of iHD for volume management   Hypertension -Resume home medications including amlodipine, isosorbide, labetalol   Chest pain, atypical, right-sided rib pain, abdominal pain (LLQ), lumbago, without provocation or alleviating factors Concern for pain seeking behavior - In "intractable pain" only on occasion, fleeting in nature - first noted RIGHT shoulde/chest pain, then epigastric pain, then diffuse abdominal pain, now LLQ abdominal pain with unremarkable physical exam - requesting specifically Dilaudid IV only and refusing further p.o. analgesics  - Patient is on BID oxycodone 5mg  - we have increased this to q6h in hopes to control what appears to be a chronic pain issue for the patient with minimal results/improvements. She continues to request IV narcotics which we discussed were not appropriate in the setting of chronic atypical pain -Telemetry and EKG unremarkable repeat chest x-ray showing mild bilateral effusions -Troponin flat, not consistent with ACS, EKG without acute ST changes -Patient resting comfortably being transported down for imaging this am and without any complaints  Hyperlipidemia -Continue Lipitor and Zetia   Chronic diastolic CHF Does not appear volume overloaded on exam. BNP elevated in setting of decreased kidney function/CKD  Anxiety,  depression -Continue Xanax and Effexor   DVT prophylaxis: SCDs Code Status: Full Family Communication: None available  Status is: inpt  Dispo: The patient is from: home              Anticipated d/c is to: home              Anticipated d/c date is: 72+ hours              Patient currently NOT medically stable for discharge  Consultants:  GI, Nephrology  Procedures:  Endoscopy 08/18/21  Antimicrobials:  Azith/ceftriaxone x5 days  Subjective: No acute issues/events overnight per report, ROS unremarkable until after exam in which she began to complain of LLQ abdominal pain out of proportion to physical exam  Objective: Vitals:   08/17/21 1538 08/17/21 2038 08/17/21 2058 08/18/21 0448  BP:  130/78  113/64  Pulse:  80  63  Resp:  18  18  Temp:  98 F (36.7 C)  98.1 F (36.7 C)  TempSrc:  Oral    SpO2: 97% (!) 88% (!) 88% 94%    Intake/Output Summary (Last 24 hours) at 08/18/2021 0749 Last data filed at 08/18/2021 0358 Gross per 24 hour  Intake 954.58 ml  Output --  Net 954.58 ml    There were no vitals filed for this visit.  Examination:  General exam: Appears calm and comfortable  Respiratory system: Clear to auscultation. Respiratory effort normal. Cardiovascular system: S1 & S2 heard, RRR. No JVD, murmurs, rubs, gallops or clicks. No pedal edema. Gastrointestinal system: Abdomen is nondistended, soft and nontender. No organomegaly or masses felt. Normal bowel sounds heard. Central nervous system: Alert and oriented. No focal neurological deficits. Extremities: Symmetric 5 x 5 power. Skin: No rashes, lesions or ulcers Psychiatry: Judgement and insight appear normal. Mood & affect appropriate.   Data Reviewed: I have personally reviewed following labs and imaging studies  CBC: Recent Labs  Lab 08/15/21 1108 08/15/21 1551 08/15/21 2358 08/16/21 0740 08/16/21 1055 08/17/21 0453 08/18/21 0540  WBC 8.7 8.6  --   --   --  11.3* 7.8  NEUTROABS 7.0 6.8  --    --   --   --   --   HGB 9.4* 9.4* 8.7* 9.1* 8.5* 8.1* 7.1*  HCT 27.2* 27.4* 25.0* 25.9* 25.2* 23.7* 21.3*  MCV 89.7 89.3  --   --   --  89.1 91.4  PLT 509.0* 433*  --   --   --  360 409    Basic Metabolic Panel: Recent Labs  Lab 08/15/21 1551 08/16/21 0740 08/17/21 0453 08/18/21 0540  NA 133* 132* 131* 132*  K 3.4* 3.5 3.6 3.7  CL 100 99 102 103  CO2 21* 20* 19* 21*  GLUCOSE 100* 88 94 70  BUN 47* 44* 44* 48*  CREATININE 5.61* 5.43* 5.25* 5.69*  CALCIUM 8.3* 8.3* 7.9* 7.6*    GFR: Estimated Creatinine Clearance: 7.3 mL/min (A) (by C-G formula based on SCr of 5.69 mg/dL (H)). Liver Function Tests: No results for input(s): AST, ALT, ALKPHOS, BILITOT, PROT, ALBUMIN in the last 168 hours. No results for input(s): LIPASE, AMYLASE in the last 168 hours. No results for input(s): AMMONIA in the last 168 hours. Coagulation Profile: Recent Labs  Lab 08/15/21 2358  INR 0.9    Cardiac Enzymes: No results for input(s): CKTOTAL, CKMB, CKMBINDEX, TROPONINI in the last 168 hours. BNP (last 3 results) No results for input(s): PROBNP in the last 8760 hours.  HbA1C: No results for input(s): HGBA1C in the last 72 hours. CBG: No results for input(s): GLUCAP in the last 168 hours. Lipid Profile: No results for input(s): CHOL, HDL, LDLCALC, TRIG, CHOLHDL, LDLDIRECT in the last 72 hours. Thyroid Function Tests: No results for input(s): TSH, T4TOTAL, FREET4, T3FREE, THYROIDAB in the last 72 hours. Anemia Panel: Recent Labs    08/16/21 1055  FERRITIN 161  TIBC 233*  IRON 61    Sepsis Labs: Recent Labs  Lab 08/15/21 2358  LATICACIDVEN 0.5     Recent Results (from the past 240 hour(s))  Resp Panel by RT-PCR (Flu A&B, Covid) Nasopharyngeal Swab     Status: None   Collection Time: 08/16/21  3:51 AM   Specimen: Nasopharyngeal Swab; Nasopharyngeal(NP) swabs in vial transport medium  Result Value Ref Range Status   SARS Coronavirus 2 by RT PCR NEGATIVE NEGATIVE Final     Comment: (NOTE) SARS-CoV-2 target nucleic acids are NOT DETECTED.  The SARS-CoV-2 RNA is generally detectable in upper respiratory specimens during the acute phase of infection. The lowest concentration of SARS-CoV-2 viral copies this assay can detect is 138 copies/mL. A negative result does not preclude SARS-Cov-2 infection and should not be used as the sole basis for treatment or other patient management decisions. A negative result may occur with  improper specimen collection/handling, submission of specimen other than nasopharyngeal swab, presence of viral mutation(s) within the areas targeted by this assay, and inadequate number of viral copies(<138 copies/mL). A negative result must be combined with clinical observations, patient history, and epidemiological information. The expected result is Negative.  Fact Sheet for Patients:  EntrepreneurPulse.com.au  Fact Sheet for Healthcare Providers:  IncredibleEmployment.be  This test is no t yet approved or cleared by the [place] FDA and  has been authorized for detection and/or diagnosis of SARS-CoV-2 by FDA under an Emergency Use Authorization (EUA). This EUA will remain  in effect (meaning this test can be used) for the duration of the COVID-19 declaration under Section 564(b)(1) of the Act, 21 U.S.C.section 360bbb-3(b)(1), unless the authorization is terminated  or revoked sooner.       Influenza A by PCR NEGATIVE NEGATIVE Final   Influenza B by PCR NEGATIVE NEGATIVE Final    Comment: (NOTE) The Xpert Xpress SARS-CoV-2/FLU/RSV plus assay is intended as an aid in the diagnosis of influenza from Nasopharyngeal swab specimens and should not be used as a sole basis for treatment. Nasal washings and aspirates are unacceptable for Xpert Xpress SARS-CoV-2/FLU/RSV testing.  Fact Sheet for Patients: EntrepreneurPulse.com.au  Fact Sheet for Healthcare  Providers: IncredibleEmployment.be  This test is not yet approved or cleared by the [place] FDA and has been authorized for detection and/or diagnosis of SARS-CoV-2 by FDA under an Emergency Use Authorization (EUA). This EUA will remain in effect (meaning this test can be used) for the duration of the COVID-19 declaration under Section 564(b)(1) of the Act, 21 U.S.C. section 360bbb-3(b)(1), unless the authorization is terminated or revoked.  Performed at [facility], [place] [address] [place], [place] [address]   Culture, blood (routine x 2)     Status: None (Preliminary result)   Collection Time: 08/16/21  7:15 AM   Specimen: BLOOD  Result Value Ref Range Status   Specimen Description   Final    BLOOD LEFT ANTECUBITAL Performed at [facility] [address], [place], [place] [address]    Special Requests   Final    BOTTLES DRAWN AEROBIC AND ANAEROBIC  Blood Culture results may not be optimal due to an inadequate volume of blood received in culture bottles Performed at [facility] [address] [place], [place] [address]    Culture   Final    NO GROWTH 1 DAY Performed at [facility], [place] [address] [place], [place] [address]    Report Status PENDING  Incomplete  Culture, blood (routine x 2)     Status: None (Preliminary result)   Collection Time: 08/16/21  7:40 AM   Specimen: BLOOD  Result Value Ref Range Status   Specimen Description   Final    BLOOD LEFT ANTECUBITAL Performed at [facility] [address], [place], [place] [address]    Special Requests   Final    BOTTLES DRAWN AEROBIC AND ANAEROBIC Blood Culture results may not be optimal due to an inadequate volume of blood received in culture bottles Performed at [facility] [address], [place], [place] [address]    Culture   Final    NO GROWTH 1 DAY Performed at [facility], [place] [address], [place], [place] [address]    Report Status PENDING  Incomplete          Radiology Studies: DG Chest Port 1 View  Result Date: 08/16/2021 CLINICAL DATA:  Chest pain. EXAM: PORTABLE CHEST 1 VIEW COMPARISON:  August 15, 2021. FINDINGS: Stable cardiomegaly. No pneumothorax or pleural effusion is noted. Left lung is clear. Stable right infrahilar opacity is noted concerning for atelectasis or infiltrate. Bony thorax is unremarkable. IMPRESSION: Stable right infrahilar opacity is noted concerning for atelectasis or possibly infiltrate. Electronically Signed   By: Marijo Conception M.D.   On: 08/16/2021 16:12    Scheduled Meds:  sodium chloride   Intravenous Once   ALPRAZolam  1 mg Oral QHS   amLODipine  10 mg Oral Daily   atorvastatin  80 mg Oral QHS   budesonide (PULMICORT) nebulizer solution  0.25 mg Nebulization BID   calcium acetate  2,001 mg Oral TID with meals   ezetimibe  10 mg Oral Daily   ipratropium-albuterol  3 mL Nebulization TID   isosorbide mononitrate  60 mg Oral Daily   labetalol  100 mg Oral BID   pantoprazole (PROTONIX) IV  40 mg Intravenous Q12H   venlafaxine XR  300 mg Oral Q breakfast   Continuous Infusions:  azithromycin 500 mg (08/17/21 2235)   cefTRIAXone (ROCEPHIN)  IV 1 g (08/17/21 2300)   metronidazole 100 mL/hr at 08/18/21 0358     LOS: 2 days   Time spent: 62min  Manjit Bufano C Lynlee Stratton, DO [facility]  If 7PM-7AM, please contact night-coverage [email or web address]  08/18/2021, 7:49 AM

## 2021-08-18 NOTE — Progress Notes (Signed)
Patient transferring to 5 [facility] at [facility], room 7. Report called to [facility] RN. [facility] set up.

## 2021-08-18 NOTE — Consult Note (Signed)
Renal Service Consult Note [facility]  [name] 08/18/2021 Sol Blazing, MD Requesting Physician: Dr. Avon Gully  Reason for Consult: Renal failure  HPI: The patient is a 60 y.o. year-old w/ hx of anemia, bipolar d/o, CKD V, COPD, CAD, depression, HTN, pancreatitis, IBD, RLS, schizophrenia, recurrent GIB, AD sp aortic stent/ SMA stent/ bilat common iliac artery stents. Pt was scheduled for AVG placement but procedure was cancelled due to active GI bleed. On admit Hb 9.4, creat 5.6, FOBT+/ melenic stool on exam. CT abd showed mild colitis, CXR showed ^'d R perihilar infiltrate supsicous for PNA. Pt was admitted and started on oxycodone, PPI IV, rocephin and IV zithromax. GI consulted, possible EGD pending clinical course. Getting abx for PNA and [place] O2 support. Asked to see for renal failure.   Pt was supposed to get new AVG a few days ago in preparation for starting dialysis. Dr Joylene Grapes is her renal MD.  Pt has not c/o's today.  No abd pain or SOB. No leg swelling.   ROS - denies CP, no joint pain, no HA, no blurry vision, no rash, no diarrhea, no nausea/ vomiting, no dysuria, no difficulty voiding   Past Medical History  Past Medical History:  Diagnosis Date   Anemia 2008   Anginal pain (HCC)    Anxiety    Aortic stenosis    abominal aorta, s/p distal aortic stent 09/28/18, 12/01/20   Arthritis    "all my joints ache" (09/07/2014)   Asthma    Bipolar 1 disorder ([place])    Blood transfusion without reported diagnosis    Bradycardia    Bruit    Carpal tunnel syndrome    Cataract    forming   Cervical cancer ([place]) 1985   CHF (congestive heart failure) ([place])    Chronic kidney disease (CKD), stage V (HCC)    COPD (chronic obstructive pulmonary disease) ([place]) 2000   Coronary artery disease    Daily headache    Depression 2000   Diverticulitis 2008   Emphysema of lung (HCC)    GERD (gastroesophageal reflux disease)    Glaucoma    Heart murmur    History  of colon polyps 2009   HLD (hyperlipidemia) 2013   Hypertension 2013   Hypovitaminosis D    IBS (irritable bowel syndrome) 2008   Myocardial infarction [facility])    2015   PAD (peripheral artery disease) ([place])    Pancreatitis 10/2011   Pneumonia 07/2014   RLS (restless legs syndrome)    Schizophrenia (HCC)    Shortness of breath    Skin cancer    Small bowel obstruction ([place]) 2008   Thyroid disease    Past Surgical History  Past Surgical History:  Procedure Laterality Date   ABDOMINAL ANGIOGRAM N/A 09/07/2014   Procedure: ABDOMINAL ANGIOGRAM;  Surgeon: Laverda Page, MD;  Location: [facility] CATH LAB;  Service: Cardiovascular;  Laterality: N/A;   ABDOMINAL AORTOGRAM W/LOWER EXTREMITY N/A 12/28/2020   Procedure: ABDOMINAL AORTOGRAM W/LOWER EXTREMITY;  Surgeon: Cherre Robins, MD;  Location: [place] CV LAB;  Service: Cardiovascular;  Laterality: N/A;   APPENDECTOMY  10/01/1994   BIOPSY  08/06/2020   Procedure: BIOPSY;  Surgeon: Jackquline Denmark, MD;  Location: [facility] ENDOSCOPY;  Service: Endoscopy;;   COLON SURGERY  10/01/2006   6 inches of colon removed due to obstruction   COLONOSCOPY WITH PROPOFOL N/A 10/25/2016   Procedure: COLONOSCOPY WITH PROPOFOL;  Surgeon: Milus Banister, MD;  Location: [facility] ENDOSCOPY;  Service:  Endoscopy;  Laterality: N/A;   COLONOSCOPY WITH PROPOFOL N/A 08/07/2020   Procedure: COLONOSCOPY WITH PROPOFOL;  Surgeon: Jackquline Denmark, MD;  Location: [facility] ENDOS[facility];  Service: Endoscopy;  Laterality: N/A;   COLONOSCOPY WITH PROPOFOL N/A 08/27/2020   Procedure: COLONOSCOPY WITH PROPOFOL;  Surgeon: Lavena Bullion, DO;  Location: [facility] ENDOSCOPY;  Service: Gastroenterology;  Laterality: N/A;   CORONARY ANGIOPLASTY WITH STENT PLACEMENT  09/07/2014   "2"   ENTEROSCOPY N/A 08/27/2020   Procedure: ENTEROSCOPY;  Surgeon: Lavena Bullion, DO;  Location: [facility] ENDOS[facility];  Service: Gastroenterology;  Laterality: N/A;  Push enteroscopy    ENTEROSCOPY N/A 06/12/2021   Procedure:  ENTEROSCOPY;  Surgeon: Rush Landmark Telford Nab., MD;  Location: [facility];  Service: Gastroenterology;  Laterality: N/A;   ESOPHAGOGASTRODUODENOSCOPY (EGD) WITH PROPOFOL N/A 08/06/2020   Procedure: ESOPHAGOGASTRODUODENOSCOPY (EGD) WITH PROPOFOL;  Surgeon: Jackquline Denmark, MD;  Location: [facility];  Service: Endoscopy;  Laterality: N/A;   GIVENS CAPSULE STUDY N/A 08/24/2020   Procedure: GIVENS CAPSULE STUDY;  Surgeon: Lavena Bullion, DO;  Location: [facility];  Service: Gastroenterology;  Laterality: N/A;   HOT HEMOSTASIS N/A 08/07/2020   Procedure: HOT HEMOSTASIS (ARGON PLASMA COAGULATION/BICAP);  Surgeon: Jackquline Denmark, MD;  Location: [facility];  Service: Endoscopy;  Laterality: N/A;   HOT HEMOSTASIS N/A 08/27/2020   Procedure: HOT HEMOSTASIS (ARGON PLASMA COAGULATION/BICAP);  Surgeon: Lavena Bullion, DO;  [facility]: [facility];  Service: Gastroenterology;  Laterality: N/A;   LEFT HEART CATH AND CORONARY ANGIOGRAPHY N/A 03/25/2018   Procedure: LEFT HEART CATH AND CORONARY ANGIOGRAPHY;  Surgeon: Nigel Mormon, MD;  Location: [place] CV LAB;  Service: Cardiovascular;  Laterality: N/A;   LEFT HEART CATH AND CORONARY ANGIOGRAPHY N/A 05/09/2021   Procedure: LEFT HEART CATH AND CORONARY ANGIOGRAPHY;  Surgeon: Adrian Prows, MD;  Location: [place] CV LAB;  Service: Cardiovascular;  Laterality: N/A;   LEFT HEART CATHETERIZATION WITH CORONARY ANGIOGRAM N/A 09/07/2014   Procedure: LEFT HEART CATHETERIZATION WITH CORONARY ANGIOGRAM;  Surgeon: Laverda Page, MD;  Location: [facility] CATH LAB;  Service: Cardiovascular;  Laterality: N/A;   LOWER EXTREMITY ANGIOGRAPHY  10/29/2017   Procedure: Lower Extremity Angiography;  Surgeon: Adrian Prows, MD;  Location: [place] CV LAB;  Service: Cardiovascular;;   PERIPHERAL VASCULAR CATHETERIZATION N/A 11/01/2015   Procedure: Renal Angiography;  Surgeon: Adrian Prows, MD;  Location: [place] CV LAB;  Service: Cardiovascular;  Laterality: N/A;    PERIPHERAL VASCULAR CATHETERIZATION N/A 04/10/2016   Procedure: Renal Angiography;  Surgeon: Adrian Prows, MD;  Location: [place] CV LAB;  Service: Cardiovascular;  Laterality: N/A;   PERIPHERAL VASCULAR CATHETERIZATION  04/10/2016   Procedure: Peripheral Vascular Intervention;  Surgeon: Adrian Prows, MD;  Location: [place] CV LAB;  Service: Cardiovascular;;   PERIPHERAL VASCULAR INTERVENTION  10/29/2017   Procedure: PERIPHERAL VASCULAR INTERVENTION;  Surgeon: Adrian Prows, MD;  Location: [place] CV LAB;  Service: Cardiovascular;;   PERIPHERAL VASCULAR INTERVENTION Bilateral 12/28/2020   Procedure: PERIPHERAL VASCULAR INTERVENTION;  Surgeon: Cherre Robins, MD;  Location: [place] CV LAB;  Service: Cardiovascular;  Laterality: Bilateral;   POLYPECTOMY  08/07/2020   Procedure: POLYPECTOMY;  Surgeon: Jackquline Denmark, MD;  [facility]: [facility];  Service: Endoscopy;;   POLYPECTOMY  08/27/2020   Procedure: POLYPECTOMY;  Surgeon: Lavena Bullion, DO;  Location: [facility];  Service: Gastroenterology;;   POLYPECTOMY     RENAL ANGIOGRAPHY N/A 10/29/2017   Procedure: RENAL ANGIOGRAPHY;  Surgeon: Adrian Prows, MD;  Location: [place] CV LAB;  Service: Cardiovascular;  Laterality: N/A;   RENAL ANGIOGRAPHY N/A 05/10/2020   Procedure: RENAL ANGIOGRAPHY;  Surgeon: Nigel Mormon, MD;  Location: [place] CV LAB;  Service: Cardiovascular;  Laterality: N/A;   RIGHT OOPHORECTOMY Right 10/01/1994   SUBMUCOSAL TATTOO INJECTION  06/12/2021   Procedure: SUBMUCOSAL TATTOO INJECTION;  Surgeon: Irving Copas., MD;  Location: [facility];  Service: Gastroenterology;;   TOTAL ABDOMINAL HYSTERECTOMY  10/02/1995   UPPER GASTROINTESTINAL ENDOSCOPY     Family History  Family History  Problem Relation Age of Onset   Other Mother        many bowel obstructions   Heart disease Mother    Colon polyps Mother    Kidney cancer Father    Bone cancer Father    Diabetes Father    Heart  disease Father    Heart disease Brother    Colon cancer Paternal Grandfather    Diabetes Daughter    Esophageal cancer Neg Hx    Rectal cancer Neg Hx    Stomach cancer Neg Hx    Social History  reports that she has been smoking cigarettes. She has a 60.00 pack-year smoking history. She has never used smokeless tobacco. She reports current drug use. Frequency: 3.00 times per week. Drug: Marijuana. She reports that she does not drink alcohol. Allergies  Allergies  Allergen Reactions   Doxycycline Anaphylaxis and Hives   Hydralazine Shortness Of Breath and Rash    Patient reports "couldn't breath"   Aspirin Other (See Comments)    Internal bleeding- MD SAID to not take this   Hydrocodone Nausea And Vomiting   Ibuprofen Other (See Comments)    Caused internal bleeding   Tylenol [Acetaminophen] Other (See Comments)    MD told the patient to not take this   Iohexol Itching and Other (See Comments)    Pt has itching nose after iv contrast injection   Home medications Prior to Admission medications   Medication Sig Start Date End Date Taking? Authorizing Provider  ALPRAZolam Duanne Moron) 1 MG tablet Take 0.5-1 tablets by mouth See admin instructions. Take 1 mg by mouth at bedtime and an additional 0.5-1 mg once a day as needed for anxiety 04/15/17  Yes [provider]  amLODipine (NORVASC) 10 MG tablet TAKE 1 TABLET BY MOUTH ONCE DAILY Patient taking differently: Take 10 mg by mouth daily. 04/13/21  Yes Cantwell, Celeste C, PA-C  atorvastatin (LIPITOR) 80 MG tablet Take 80 mg by mouth at bedtime.   Yes [provider]  calcium acetate (PHOSLO) 667 MG capsule Take 2,001 mg by mouth See admin instructions. Take 2001mg  (3 capsules) by mouth three times a day with meals and 667mg  (1 capsule) with a snack   Yes [provider]  ezetimibe (ZETIA) 10 MG tablet TAKE 1 TABLET BY MOUTH ONCE A DAY Patient taking differently: Take 10 mg by mouth daily. 05/03/21  Yes Cantwell, Celeste  C, PA-C  isosorbide mononitrate (IMDUR) 60 MG 24 hr tablet Take 1 tablet (60 mg total) by mouth daily. 06/14/21 09/12/21 Yes Dahal, Marlowe Aschoff, MD  labetalol (NORMODYNE) 100 MG tablet Take 100 mg by mouth 2 (two) times daily.   Yes [provider]  LINZESS 72 MCG capsule TAKE 1 CAPSULE BY MOUTH ONCE DAILY BEFORE BREAKFAST Patient taking differently: Take 72 mcg by mouth daily before breakfast. 08/03/21  Yes Levin Erp, PA  megestrol (MEGACE) 40 MG tablet Take 40 mg by mouth daily.   Yes [provider]  mometasone-formoterol (DULERA) 100-5 MCG/ACT  AERO Take 2 puffs first thing in am and then another 2 puffs about 12 hours later. Patient taking differently: Inhale 2 puffs into the lungs every 12 (twelve) hours. 04/26/17  Yes Tanda Rockers, MD  nitroGLYCERIN (NITROSTAT) 0.4 MG SL tablet Place 1 tablet (0.4 mg total) under the tongue every 5 (five) minutes as needed for chest pain. 10/15/19  Yes Miquel Dunn, NP  ondansetron (ZOFRAN-ODT) 4 MG disintegrating tablet Take 1 tablet (4 mg total) by mouth every 8 (eight) hours as needed for nausea or vomiting. 07/25/21  Yes Hayden Rasmussen, MD  oxyCODONE (OXY IR/ROXICODONE) 5 MG immediate release tablet Take 5 mg by mouth in the morning and at bedtime.   Yes [provider]  pantoprazole (PROTONIX) 40 MG tablet TAKE 1 TABLET BY MOUTH TWICE (2) DAILY Patient taking differently: Take 40 mg by mouth 2 (two) times daily. 06/08/20  Yes Adrian Prows, MD  promethazine (PHENERGAN) 25 MG tablet Take 25 mg by mouth 2 (two) times daily as needed for nausea or vomiting. 08/16/20  Yes [provider]  venlafaxine XR (EFFEXOR XR) 150 MG 24 hr capsule Take 2 capsules (300 mg total) by mouth daily with breakfast. 05/12/21  Yes Arrien, Jimmy Picket, MD  albuterol (PROVENTIL HFA;VENTOLIN HFA) 108 (90 BASE) MCG/ACT inhaler Inhale 2 puffs into the lungs every 6 (six) hours as needed for wheezing or shortness of breath  (asthma). Patient not taking: Reported on 08/16/2021    [provider]  azelastine (ASTELIN) 0.1 % nasal spray Place 1 spray into both nostrils daily as needed (seasonal allergies). Patient not taking: Reported on 08/16/2021 06/24/19   [provider]  calcitRIOL (ROCALTROL) 0.5 MCG capsule Take 0.5 mcg by mouth daily. Patient not taking: No sig reported    [provider]  feeding supplement (ENSURE ENLIVE / ENSURE PLUS) LIQD Take 237 mLs by mouth daily. Patient not taking: No sig reported 06/13/21   Terrilee Croak, MD     Vitals:   08/18/21 0936 08/18/21 1051 08/18/21 1130 08/18/21 1216  BP:  (!) 159/81 136/69 (!) 169/67  Pulse:  71 64 68  Resp:  20 18 16   Temp:  97.9 F (36.6 C) 97.8 F (36.6 C) 98.1 F (36.7 C)  TempSrc:  Oral Oral Oral  SpO2: 90% 92% 95% 91%  Weight:    43.1 kg  Height:    5\' 1"  (1.549 m)   Exam Gen alert, no distress, tiny and frail, looks older than stated age No rash, cyanosis or gangrene Sclera anicteric, throat clear  No jvd or bruits Chest good air movement, no rales, +mild exp wheezing RRR no MRG Abd soft ntnd no mass or ascites +bs GU defer MS no joint effusions or deformity Ext no LE or UE edema, no wounds or ulcers Neuro is alert, Ox 3 , nf      Home meds include - xanax prn, norvasc 10, lipitor, phoslo 3 ac, zetia, imdur, labetalol 100 bid, linzess, megace, dulera act, sl ntg prn, ocy IR prn, protonix, effexor XR, albuterol prn, ensure, prn's/ supps / vits    CXR - new small bilat effusions, +basilar atx    BP 120-150/ 69- 80,  HR 15-21,  RR 18- 20   [place] 3-4 L     Assessment/ Plan: CKD V - creat worsening over last few mos from 2.8 in Oct to 3.8 in Sept and mid 5's now in November. Pt admitted for GIB.  Needs to start on dialysis,  not emergently, but while here. Plan consult IR for [facility] , Monday is ok.  HD after access established.  GI bleed - Hb 7.1 this am, +melena and +FOB. Per primary/ GI.   HTN - BP's  normal to high-normal. Cont home meds norvasc + labetalol.  PNA - on rocephin and Zmax COPD - on O2 Volume - no vol excess on exam, may be a little dry . No IVF's needed.  Anemia - some part is due to CKD. Consider esa.  MBD ckd - cont binders phoslo 3 ac tid.       Kelly Splinter  MD 08/18/2021, 12:37 PM  Recent Labs  Lab 08/17/21 0453 08/18/21 0540  WBC 11.3* 7.8  HGB 8.1* 7.1*   Recent Labs  Lab 08/17/21 0453 08/18/21 0540  K 3.6 3.7  BUN 44* 48*  CREATININE 5.25* 5.69*  CALCIUM 7.9* 7.6*

## 2021-08-18 NOTE — Op Note (Signed)
[facility] Patient Name: [name] Procedure Date: 08/18/2021 MRN: [id number] Attending MD: Jerene Bears , MD Date of Birth: 1961/05/24 CSN: [id number] Age: 60 Admit Type: Inpatient Procedure:                Small bowel enteroscopy Indications:              Acute post hemorrhagic acute on chronic anemia,                            Recurrent gastrointestinal blood loss, history of                            intestinal angioectasias Providers:                Lajuan Lines. Hilarie Fredrickson, MD, Doristine Johns, RN, Cletis Athens, Technician, Lodema Hong Technician,                            [employer]chant [employer] officer Referring MD:             [facility] Medicines:                Monitored Anesthesia Care Complications:            No immediate complications. Estimated Blood Loss:     Estimated blood loss: none. Procedure:                Pre-Anesthesia Assessment:                           - Prior to the procedure, a History and Physical                            was performed, and patient medications and                            allergies were reviewed. [name] tolerance of                            previous anesthesia was also reviewed. The risks                            and benefits of the procedure and the sedation                            options and risks were discussed with the patient.                            All questions were answered, and informed consent                            was obtained. Prior Anticoagulants: The patient has                            taken no  previous anticoagulant or antiplatelet                            agents. ASA Grade Assessment: III - A patient with                            severe systemic disease. After reviewing the risks                            and benefits, the patient was deemed in                            satisfactory condition to undergo the procedure.                           After  obtaining informed consent, the endoscope was                            passed under direct vision. Throughout the                            procedure, the patient's blood pressure, pulse, and                            oxygen saturations were monitored continuously. The                            [id number]-HQ190L ([id number]) Olympus colonoscope was                            introduced through the mouth and advanced to the                            proximal jejunum. The small bowel enteroscopy was                            accomplished without difficulty. The patient                            tolerated the procedure well. Scope In: Scope Out: Findings:      The examined esophagus was normal.      The entire examined stomach was normal.      There was no evidence of significant pathology in the entire examined       duodenum.      There was no evidence of significant pathology in the proximal jejunum.      Using the endoscope, the video capsule enteroscope was advanced into the       duodenal bulb. Impression:               - Normal esophagus.                           - Normal stomach.                           -  Normal examined duodenum.                           - The examined portion of the jejunum was normal.                           - Successful completion of the Video Capsule                            Enteroscope placement.                           - No specimens collected. Moderate Sedation:      N/A Recommendation:           - Return patient to hospital ward for ongoing care.                           - Advance diet as tolerated (video capsule                            protocol).                           - Follow Hgb and patient response to 1 u pRBC (iron                            studies were not consistent with IDA and some part                            of anemia felt secondary to severe renal disease).                           - Video capsule study to complete over  the next 12                            hours. This will help determine if angioectasias                            are present in deep small bowel (this would explain                            heme + stools and anemia, but patient not currently                            a candidate for further endoscopic evaluation)                           - Daily PPI.                           - Patient will be transferred to [facility] in                            anticipation of possible hemodialysis start. Procedure  Code(s):        --- Professional ---                           [phone], Small intestinal endoscopy, enteroscopy                            beyond second portion of duodenum, not including                            ileum; diagnostic, including collection of                            specimen(s) by brushing or washing, when performed                            (separate procedure) Diagnosis Code(s):        --- Professional ---                           D62, Acute posthemorrhagic anemia                           K92.2, Gastrointestinal hemorrhage, unspecified CPT copyright 2019 [employer]. All rights reserved. The codes documented in this report are preliminary and upon coder review may  be revised to meet current compliance requirements. Jerene Bears, MD 08/18/2021 2:04:50 PM This report has been signed electronically. Number of Addenda: 0

## 2021-08-18 NOTE — Consult Note (Signed)
Chief Complaint: Patient was seen in consultation today for  Chief Complaint  Patient presents with   Abdominal Pain   Rectal Bleeding   Abnormal Lab   at the request of Dr. Jonnie Finner   Supervising Physician: Aletta Edouard  Patient Status: [facility] - In[facility]pt  History of Present Illness: [name] is a 60 y.o. female admitted to [facility] with acute on chronic GI bleed.  She has a medical history significant of hypertension, hyperlipidemia, chronic diastolic CHF, CAD, aortic stenosis status post stent, COPD, tobacco use, PAD status post aortic stent/SMA stent/bilateral common iliac artery stent, CKD stage V, chronic pain, anxiety, depression, chronic mesenteric ischemia, history of GI bleed with recent admission in September 2022, chronic blood loss anemia.  She was scheduled for AV graft fistula on 11/16 but it was canceled  due to active bleed.  IR has been consulted for placement of tunneled HD catheter.  Past Medical History:  Diagnosis Date   Anemia 2008   Anginal pain ([place])    Anxiety    Aortic stenosis    abominal aorta, s/p distal aortic stent 09/28/18, 12/01/20   Arthritis    "all my joints ache" (09/07/2014)   Asthma    Bipolar 1 disorder ([place])    Blood transfusion without reported diagnosis    Bradycardia    Bruit    Carpal tunnel syndrome    Cataract    forming   Cervical cancer ([place]) 1985   CHF (congestive heart failure) ([place])    Chronic kidney disease (CKD), stage V (HCC)    COPD (chronic obstructive pulmonary disease) ([place]) 2000   Coronary artery disease    Daily headache    Depression 2000   Diverticulitis 2008   Emphysema of lung (HCC)    GERD (gastroesophageal reflux disease)    Glaucoma    Heart murmur    History of colon polyps 2009   HLD (hyperlipidemia) 2013   Hypertension 2013   Hypovitaminosis D    IBS (irritable bowel syndrome) 2008   Myocardial infarction [facility])    2015   PAD (peripheral artery disease) ([place])    Pancreatitis 10/2011    Pneumonia 07/2014   RLS (restless legs syndrome)    Schizophrenia (HCC)    Shortness of breath    Skin cancer    Small bowel obstruction ([place]) 2008   Thyroid disease     Past Surgical History:  Procedure Laterality Date   ABDOMINAL ANGIOGRAM N/A 09/07/2014   Procedure: ABDOMINAL ANGIOGRAM;  Surgeon: Laverda Page, MD;  Location: [facility] CATH LAB;  Service: Cardiovascular;  Laterality: N/A;   ABDOMINAL AORTOGRAM W/LOWER EXTREMITY N/A 12/28/2020   Procedure: ABDOMINAL AORTOGRAM W/LOWER EXTREMITY;  Surgeon: Cherre Robins, MD;  Location: [place] CV LAB;  Service: Cardiovascular;  Laterality: N/A;   APPENDECTOMY  10/01/1994   BIOPSY  08/06/2020   Procedure: BIOPSY;  Surgeon: Jackquline Denmark, MD;  Location: [facility] ENDOSCOPY;  Service: Endoscopy;;   COLON SURGERY  10/01/2006   6 inches of colon removed due to obstruction   COLONOSCOPY WITH PROPOFOL N/A 10/25/2016   Procedure: COLONOSCOPY WITH PROPOFOL;  Surgeon: Milus Banister, MD;  Location: [facility] ENDOSCOPY;  Service: Endoscopy;  Laterality: N/A;   COLONOSCOPY WITH PROPOFOL N/A 08/07/2020   Procedure: COLONOSCOPY WITH PROPOFOL;  Surgeon: Jackquline Denmark, MD;  Location: [facility] ENDOSCOPY;  Service: Endoscopy;  Laterality: N/A;   COLONOSCOPY WITH PROPOFOL N/A 08/27/2020   Procedure: COLONOSCOPY WITH PROPOFOL;  Surgeon: Lavena Bullion, DO;  Location: [facility]  ENDOSCOPY;  Service: Gastroenterology;  Laterality: N/A;   CORONARY ANGIOPLASTY WITH STENT PLACEMENT  09/07/2014   "2"   ENTEROSCOPY N/A 08/27/2020   Procedure: ENTEROSCOPY;  Surgeon: Lavena Bullion, DO;  Location: [facility] ENDOSCOPY;  Service: Gastroenterology;  Laterality: N/A;  Push enteroscopy    ENTEROSCOPY N/A 06/12/2021   Procedure: ENTEROSCOPY;  Surgeon: Rush Landmark Telford Nab., MD;  Location: [facility] ENDOS[facility];  Service: Gastroenterology;  Laterality: N/A;   ESOPHAGOGASTRODUODENOSCOPY (EGD) WITH PROPOFOL N/A 08/06/2020   Procedure: ESOPHAGOGASTRODUODENOSCOPY (EGD) WITH PROPOFOL;  Surgeon: Jackquline Denmark, MD;  [facility]: [facility];  Service: Endoscopy;  Laterality: N/A;   GIVENS CAPSULE STUDY N/A 08/24/2020   Procedure: GIVENS CAPSULE STUDY;  Surgeon: Lavena Bullion, DO;  [facility]: [facility];  Service: Gastroenterology;  Laterality: N/A;   HOT HEMOSTASIS N/A 08/07/2020   Procedure: HOT HEMOSTASIS (ARGON PLASMA COAGULATION/BICAP);  Surgeon: Jackquline Denmark, MD;  Location: [facility];  Service: Endoscopy;  Laterality: N/A;   HOT HEMOSTASIS N/A 08/27/2020   Procedure: HOT HEMOSTASIS (ARGON PLASMA COAGULATION/BICAP);  Surgeon: Lavena Bullion, DO;  [facility];  Service: Gastroenterology;  Laterality: N/A;   LEFT HEART CATH AND CORONARY ANGIOGRAPHY N/A 03/25/2018   Procedure: LEFT HEART CATH AND CORONARY ANGIOGRAPHY;  Surgeon: Nigel Mormon, MD;  Location: [place] CV LAB;  Service: Cardiovascular;  Laterality: N/A;   LEFT HEART CATH AND CORONARY ANGIOGRAPHY N/A 05/09/2021   Procedure: LEFT HEART CATH AND CORONARY ANGIOGRAPHY;  Surgeon: Adrian Prows, MD;  Location: [place] CV LAB;  Service: Cardiovascular;  Laterality: N/A;   LEFT HEART CATHETERIZATION WITH CORONARY ANGIOGRAM N/A 09/07/2014   Procedure: LEFT HEART CATHETERIZATION WITH CORONARY ANGIOGRAM;  Surgeon: Laverda Page, MD;  Location: [facility] CATH LAB;  Service: Cardiovascular;  Laterality: N/A;   LOWER EXTREMITY ANGIOGRAPHY  10/29/2017   Procedure: Lower Extremity Angiography;  Surgeon: Adrian Prows, MD;  Location: [place] CV LAB;  Service: Cardiovascular;;   PERIPHERAL VASCULAR CATHETERIZATION N/A 11/01/2015   Procedure: Renal Angiography;  Surgeon: Adrian Prows, MD;  Location: [place] CV LAB;  Service: Cardiovascular;  Laterality: N/A;   PERIPHERAL VASCULAR CATHETERIZATION N/A 04/10/2016   Procedure: Renal Angiography;  Surgeon: Adrian Prows, MD;  Location: [place] CV LAB;  Service: Cardiovascular;  Laterality: N/A;   PERIPHERAL VASCULAR CATHETERIZATION  04/10/2016   Procedure: Peripheral  Vascular Intervention;  Surgeon: Adrian Prows, MD;  Location: [place] CV LAB;  Service: Cardiovascular;;   PERIPHERAL VASCULAR INTERVENTION  10/29/2017   Procedure: PERIPHERAL VASCULAR INTERVENTION;  Surgeon: Adrian Prows, MD;  Location: [place] CV LAB;  Service: Cardiovascular;;   PERIPHERAL VASCULAR INTERVENTION Bilateral 12/28/2020   Procedure: PERIPHERAL VASCULAR INTERVENTION;  Surgeon: Cherre Robins, MD;  Location: [place] CV LAB;  Service: Cardiovascular;  Laterality: Bilateral;   POLYPECTOMY  08/07/2020   Procedure: POLYPECTOMY;  Surgeon: Jackquline Denmark, MD;  [facility];  Service: Endoscopy;;   POLYPECTOMY  08/27/2020   Procedure: POLYPECTOMY;  Surgeon: Lavena Bullion, DO;  [facility];  Service: Gastroenterology;;   POLYPECTOMY     RENAL ANGIOGRAPHY N/A 10/29/2017   Procedure: RENAL ANGIOGRAPHY;  Surgeon: Adrian Prows, MD;  Location: [place] CV LAB;  Service: Cardiovascular;  Laterality: N/A;   RENAL ANGIOGRAPHY N/A 05/10/2020   Procedure: RENAL ANGIOGRAPHY;  Surgeon: Nigel Mormon, MD;  Location: [place] CV LAB;  Service: Cardiovascular;  Laterality: N/A;   RIGHT OOPHORECTOMY Right 10/01/1994   SUBMUCOSAL TATTOO INJECTION  06/12/2021   Procedure: SUBMUCOSAL TATTOO INJECTION;  Surgeon: Justice Britain  Brooke Bonito., MD;  Location: [facility];  Service: Gastroenterology;;   TOTAL ABDOMINAL HYSTERECTOMY  10/02/1995   UPPER GASTROINTESTINAL ENDOSCOPY      Allergies: Doxycycline, Hydralazine, Aspirin, Hydrocodone, Ibuprofen, Tylenol [acetaminophen], and Iohexol  Medications: Prior to Admission medications   Medication Sig Start Date End Date Taking? Authorizing Provider  ALPRAZolam Duanne Moron) 1 MG tablet Take 0.5-1 tablets by mouth See admin instructions. Take 1 mg by mouth at bedtime and an additional 0.5-1 mg once a day as needed for anxiety 04/15/17  Yes [provider]  amLODipine (NORVASC) 10 MG tablet TAKE 1 TABLET BY MOUTH ONCE  DAILY Patient taking differently: Take 10 mg by mouth daily. 04/13/21  Yes Cantwell, Celeste C, PA-C  atorvastatin (LIPITOR) 80 MG tablet Take 80 mg by mouth at bedtime.   Yes [provider]  calcium acetate (PHOSLO) 667 MG capsule Take 2,001 mg by mouth See admin instructions. Take 2001mg  (3 capsules) by mouth three times a day with meals and 667mg  (1 capsule) with a snack   Yes [provider]  ezetimibe (ZETIA) 10 MG tablet TAKE 1 TABLET BY MOUTH ONCE A DAY Patient taking differently: Take 10 mg by mouth daily. 05/03/21  Yes Cantwell, Celeste C, PA-C  isosorbide mononitrate (IMDUR) 60 MG 24 hr tablet Take 1 tablet (60 mg total) by mouth daily. 06/14/21 09/12/21 Yes Dahal, Marlowe Aschoff, MD  labetalol (NORMODYNE) 100 MG tablet Take 100 mg by mouth 2 (two) times daily.   Yes [provider]  LINZESS 72 MCG capsule TAKE 1 CAPSULE BY MOUTH ONCE DAILY BEFORE BREAKFAST Patient taking differently: Take 72 mcg by mouth daily before breakfast. 08/03/21  Yes Levin Erp, PA  megestrol (MEGACE) 40 MG tablet Take 40 mg by mouth daily.   Yes [provider]  mometasone-formoterol (DULERA) 100-5 MCG/ACT AERO Take 2 puffs first thing in am and then another 2 puffs about 12 hours later. Patient taking differently: Inhale 2 puffs into the lungs every 12 (twelve) hours. 04/26/17  Yes Tanda Rockers, MD  nitroGLYCERIN (NITROSTAT) 0.4 MG SL tablet Place 1 tablet (0.4 mg total) under the tongue every 5 (five) minutes as needed for chest pain. 10/15/19  Yes Miquel Dunn, NP  ondansetron (ZOFRAN-ODT) 4 MG disintegrating tablet Take 1 tablet (4 mg total) by mouth every 8 (eight) hours as needed for nausea or vomiting. 07/25/21  Yes Hayden Rasmussen, MD  oxyCODONE (OXY IR/ROXICODONE) 5 MG immediate release tablet Take 5 mg by mouth in the morning and at bedtime.   Yes [provider]  pantoprazole (PROTONIX) 40 MG tablet TAKE 1 TABLET BY MOUTH TWICE (2) DAILY Patient  taking differently: Take 40 mg by mouth 2 (two) times daily. 06/08/20  Yes Adrian Prows, MD  promethazine (PHENERGAN) 25 MG tablet Take 25 mg by mouth 2 (two) times daily as needed for nausea or vomiting. 08/16/20  Yes [provider]  venlafaxine XR (EFFEXOR XR) 150 MG 24 hr capsule Take 2 capsules (300 mg total) by mouth daily with breakfast. 05/12/21  Yes Arrien, Jimmy Picket, MD  albuterol (PROVENTIL HFA;VENTOLIN HFA) 108 (90 BASE) MCG/ACT inhaler Inhale 2 puffs into the lungs every 6 (six) hours as needed for wheezing or shortness of breath (asthma). Patient not taking: Reported on 08/16/2021    [provider]  azelastine (ASTELIN) 0.1 % nasal spray Place 1 spray into both nostrils daily as needed (seasonal allergies). Patient not taking: Reported on 08/16/2021 06/24/19   [provider]  calcitRIOL (ROCALTROL)  0.5 MCG capsule Take 0.5 mcg by mouth daily. Patient not taking: No sig reported    [provider]  feeding supplement (ENSURE ENLIVE / ENSURE PLUS) LIQD Take 237 mLs by mouth daily. Patient not taking: No sig reported 06/13/21   Terrilee Croak, MD     Family History  Problem Relation Age of Onset   Other Mother        many bowel obstructions   Heart disease Mother    Colon polyps Mother    Kidney cancer Father    Bone cancer Father    Diabetes Father    Heart disease Father    Heart disease Brother    Colon cancer Paternal Grandfather    Diabetes Daughter    Esophageal cancer Neg Hx    Rectal cancer Neg Hx    Stomach cancer Neg Hx     Social History   Socioeconomic History   Marital status: Divorced    Spouse name: Not on file   Number of children: 1   Years of education: Not on file   Highest education level: Not on file  Occupational History   Occupation: Scientist, research (life sciences): UNEMPLOYED  Tobacco Use   Smoking status: Some Days    Packs/day: 1.50    Years: 40.00    Pack years: 60.00    Types: Cigarettes    Smokeless tobacco: Never  Vaping Use   Vaping Use: Former   Substances: THC  Substance and Sexual Activity   Alcohol use: No   Drug use: Yes    Frequency: 3.0 times per week    Types: Marijuana    Comment: last use yesterday   Sexual activity: Yes    Birth control/protection: Post-menopausal  Other Topics Concern   Not on file  Social History Narrative   Not on file   Social Determinants of Health   Financial Resource Strain: Not on file  Food Insecurity: Not on file  Transportation Needs: Not on file  Physical Activity: Not on file  Stress: Not on file  Social Connections: Not on file      Review of Systems: A 12 point ROS discussed and pertinent positives are indicated in the HPI above.  All other systems are negative.  Review of Systems  Constitutional:  Positive for activity change and appetite change.  HENT: Negative.    Eyes: Negative.   Respiratory: Negative.    Cardiovascular: Negative.   Gastrointestinal:  Positive for blood in stool and constipation.  Endocrine: Negative.   Genitourinary: Negative.   Musculoskeletal:  Positive for back pain.  Skin: Negative.   Allergic/Immunologic: Negative.   Neurological: Negative.   Hematological: Negative.   Psychiatric/Behavioral: Negative.     Vital Signs: BP 136/69   Pulse 64   Temp 97.8 F (36.6 C) (Oral)   Resp 18   SpO2 95%   Physical Exam Constitutional:      Appearance: She is ill-appearing.  HENT:     Mouth/Throat:     Mouth: Mucous membranes are moist.     Pharynx: Oropharynx is clear.  Eyes:     Extraocular Movements: Extraocular movements intact.  Cardiovascular:     Rate and Rhythm: Normal rate and regular rhythm.  Pulmonary:     Effort: Pulmonary effort is normal. No respiratory distress.  Abdominal:     General: Abdomen is flat. There is no distension.     Palpations: Abdomen is soft.     Tenderness: There is abdominal tenderness  in the left upper quadrant.  Skin:    Capillary  Refill: Capillary refill takes more than 3 seconds.     Coloration: Skin is pale.  Neurological:     General: No focal deficit present.     Mental Status: She is alert.  Psychiatric:        Mood and Affect: Mood is anxious.    Imaging: CT ABDOMEN PELVIS WO CONTRAST  Result Date: 08/16/2021 CLINICAL DATA:  Acute abdominal pain with rectal bleeding, initial encounter EXAM: CT ABDOMEN AND PELVIS WITHOUT CONTRAST TECHNIQUE: Multidetector CT imaging of the abdomen and pelvis was performed following the standard protocol without IV contrast. COMPARISON:  07/25/2021 FINDINGS: Lower chest: Mild atelectatic changes are noted in the left lung base. Hepatobiliary: No focal liver abnormality is seen. No gallstones, gallbladder wall thickening, or biliary dilatation. Pancreas: Unremarkable. No pancreatic ductal dilatation or surrounding inflammatory changes. Spleen: Normal in size without focal abnormality. Adrenals/Urinary Tract: Adrenal glands are within normal limits. No renal calculi or obstructive changes are seen. The bladder is partially distended. Stomach/Bowel: Mild wall thickening is noted within the colon which may be related to incomplete distension although the possibility of mild colitis could not be totally excluded given the patient's clinical history. Changes of prior appendectomy are seen. Small bowel and stomach appear within normal limits. Vascular/Lymphatic: Diffuse atherosclerotic calcifications are noted. Kissing iliac stents are seen. No significant lymphadenopathy is noted. Reproductive: Status post hysterectomy. No adnexal masses. Other: No abdominal wall hernia or abnormality. No abdominopelvic ascites. Musculoskeletal: No acute or significant osseous findings. IMPRESSION: Mild wall thickening within the colon as described. This may represent some mild colitis. No other focal abnormality is noted. Electronically Signed   By: Inez Catalina M.D.   On: 08/16/2021 01:23   CT ABDOMEN PELVIS WO  CONTRAST  Result Date: 07/25/2021 CLINICAL DATA:  Abdominal pain EXAM: CT ABDOMEN AND PELVIS WITHOUT CONTRAST TECHNIQUE: Multidetector CT imaging of the abdomen and pelvis was performed following the standard protocol without IV contrast. COMPARISON:  CT abdomen and pelvis 06/10/2021 FINDINGS: Lower chest: There is of compressive atelectasis visualized in the medial aspect of the right middle lobe and lower lobe, new since previous study. Cardiomegaly. Hepatobiliary: No focal liver abnormality is seen. No gallstones, gallbladder wall thickening, or biliary dilatation. Pancreas: Unremarkable. No pancreatic ductal dilatation or surrounding inflammatory changes. Spleen: Normal in size without focal abnormality. Adrenals/Urinary Tract: Adrenal glands are within normal limits. Stable chronic left renal atrophy. No nephrolithiasis or hydronephrosis identified bilaterally. Urinary bladder appears unremarkable. Stomach/Bowel: No bowel obstruction, free air or pneumatosis. Appears to be wall thickening of the descending and sigmoid colon, limited evaluation due to nondistention. Appendix not visualized. Vascular/Lymphatic: Severe calcified atherosclerotic disease. No bulky lymphadenopathy identified. Reproductive: Status post hysterectomy. No adnexal masses. Other: Stable small amount of free fluid in the dependent pelvis. Musculoskeletal: No acute or significant osseous findings. IMPRESSION: 1. Apparent wall thickening of the descending and sigmoid colon although limited evaluation due to nondistention. Correlate for possible colitis. 2. Chronic left renal atrophy. 3. Stable small amount of free fluid in the dependent pelvis. 4. Compressive atelectatic changes in the medial right middle and lower lobe, new since previous study. 5. Severe atherosclerotic disease noted. Electronically Signed   By: Ofilia Neas   On: 07/25/2021 12:34   DG Chest 2 View  Result Date: 08/18/2021 CLINICAL DATA:  Shortness of breath,  abdominal pain EXAM: CHEST - 2 VIEW COMPARISON:  Radiograph 08/16/2021 FINDINGS: The unchanged cardiomediastinal silhouette. There are new  small bilateral pleural effusions with adjacent basilar opacities. No visible pneumothorax. No acute osseous abnormality. IMPRESSION: New small bilateral pleural effusions with adjacent bibasilar opacities favored to be atelectasis. Electronically Signed   By: Maurine Simmering M.D.   On: 08/18/2021 09:30   DG Chest Port 1 View  Result Date: 08/16/2021 CLINICAL DATA:  Chest pain. EXAM: PORTABLE CHEST 1 VIEW COMPARISON:  August 15, 2021. FINDINGS: Stable cardiomegaly. No pneumothorax or pleural effusion is noted. Left lung is clear. Stable right infrahilar opacity is noted concerning for atelectasis or infiltrate. Bony thorax is unremarkable. IMPRESSION: Stable right infrahilar opacity is noted concerning for atelectasis or possibly infiltrate. Electronically Signed   By: Marijo Conception M.D.   On: 08/16/2021 16:12   DG Chest Portable 1 View  Result Date: 08/15/2021 CLINICAL DATA:  Cough and fever EXAM: PORTABLE CHEST 1 VIEW COMPARISON:  07/25/2021 FINDINGS: Increased opacity in the parahilar right lung. No pleural effusion or pneumothorax. Lungs are hyperinflated. IMPRESSION: Increased opacity in the parahilar right lung, possibly infection. Electronically Signed   By: Ulyses Jarred M.D.   On: 08/15/2021 23:44   DG Chest Port 1 View  Result Date: 07/25/2021 CLINICAL DATA:  Chest pain EXAM: PORTABLE CHEST 1 VIEW COMPARISON:  05/08/2021 FINDINGS: Stable chronic hyperinflation. Heart and mediastinal contours are within normal limits. No focal opacities or effusions. No acute bony abnormality. Old right clavicle fracture. IMPRESSION: No active cardiopulmonary disease. Electronically Signed   By: Rolm Baptise M.D.   On: 07/25/2021 20:48   VAS [place] UPPER EXTREMITY ARTERIAL DUPLEX  Result Date: 08/07/2021  UPPER EXTREMITY DUPLEX STUDY Patient Name:  [name]  Date  of Exam:   08/07/2021 Medical Rec #: [id number]         Accession #:    [id number] Date of Birth: 1961/01/13         Patient Gender: F Patient Age:   19 years Exam Location:  [facility] Procedure:      VAS [place] UPPER EXTREMITY ARTERIAL DUPLEX Referring Phys: Jamelle Haring --------------------------------------------------------------------------------  Indications: New access. History:     Patient has a history of CKD 4.  Performing Technologist: Ronal Fear RVS, RCS  Examination Guidelines: A complete evaluation includes B-mode imaging, spectral Doppler, color Doppler, and power Doppler as needed of all accessible portions of each vessel. Bilateral testing is considered an integral part of a complete examination. Limited examinations for reoccurring indications may be performed as noted.  Right Pre-Dialysis Findings: +-----------------------+----------+--------------------+---------+--------+ Location               PSV (cm/s)Intralum. Diam. (cm)Waveform Comments +-----------------------+----------+--------------------+---------+--------+ Brachial Antecub. fossa103       0.51                triphasic         +-----------------------+----------+--------------------+---------+--------+ Radial Art at Wrist    84        0.24                triphasic         +-----------------------+----------+--------------------+---------+--------+ Ulnar Art at Wrist     87        0.34                triphasic         +-----------------------+----------+--------------------+---------+--------+  Left Pre-Dialysis Findings: +------------------+----------+-------------------+---------+------------------+ Location          PSV (cm/s)Intralum. DiamTressia Danas Comments                                       (  cm)                                           +------------------+----------+-------------------+---------+------------------+ Brachial Antecub. 120       0.47                triphasic                   fossa                                                                      +------------------+----------+-------------------+---------+------------------+ Radial Art at     99        0.30               triphasictrotuous in upper  Wrist                                                   forearm            +------------------+----------+-------------------+---------+------------------+ Ulnar Art at Wrist85        0.27               triphasic                   +------------------+----------+-------------------+---------+------------------+  Summary:  Right: No obstruction visualized in the right upper extremity. Left: No obstruction visualized in the left upper extremity. *See table(s) above for measurements and observations. Electronically signed by Harold Barban MD on 08/07/2021 at 6:14:39 PM.    Final    VAS [place] UPPER EXTREMITY VEIN MAPPING  Result Date: 08/07/2021 UPPER EXTREMITY VEIN MAPPING Patient Name:  [name]  Date of Exam:   08/07/2021 Medical Rec #: [id number]         Accession #:    [id number] Date of Birth: 08/31/1961         Patient Gender: F Patient Age:   61 years Exam Location:  [facility] Procedure:      VAS [place] UPPER EXT VEIN MAPPING (PRE-OP AVF) Referring Phys: Harold Barban --------------------------------------------------------------------------------  Indications: Pre-access. History: CKD 4.  Performing Technologist: Ronal Fear RVS, RCS  Examination Guidelines: A complete evaluation includes B-mode imaging, spectral Doppler, color Doppler, and power Doppler as needed of all accessible portions of each vessel. Bilateral testing is considered an integral part of a complete examination. Limited examinations for reoccurring indications may be performed as noted. +-----------------+-------------+----------+--------+ Right Cephalic   Diameter (cm)Depth (cm)Findings  +-----------------+-------------+----------+--------+ Shoulder             0.24                        +-----------------+-------------+----------+--------+ Prox upper arm       0.23                        +-----------------+-------------+----------+--------+ Mid upper arm        0.23                        +-----------------+-------------+----------+--------+  Dist upper arm       0.29                        +-----------------+-------------+----------+--------+ Antecubital fossa    0.37                        +-----------------+-------------+----------+--------+ Prox forearm         0.11                        +-----------------+-------------+----------+--------+ Mid forearm          0.16                        +-----------------+-------------+----------+--------+ Dist forearm         0.25                        +-----------------+-------------+----------+--------+ +-----------------+-------------+----------+--------+ Right Basilic    Diameter (cm)Depth (cm)Findings +-----------------+-------------+----------+--------+ Prox upper arm       0.24                        +-----------------+-------------+----------+--------+ Mid upper arm        0.37                        +-----------------+-------------+----------+--------+ Dist upper arm       0.40                        +-----------------+-------------+----------+--------+ Antecubital fossa    0.42                        +-----------------+-------------+----------+--------+ Prox forearm         0.35                        +-----------------+-------------+----------+--------+ +-----------------+-------------+----------+--------------+ Left Cephalic    Diameter (cm)Depth (cm)   Findings    +-----------------+-------------+----------+--------------+ Shoulder             0.13                              +-----------------+-------------+----------+--------------+ Prox upper arm       0.16                               +-----------------+-------------+----------+--------------+ Mid upper arm        0.11                              +-----------------+-------------+----------+--------------+ Dist upper arm       0.16                              +-----------------+-------------+----------+--------------+ Antecubital fossa    0.11                              +-----------------+-------------+----------+--------------+ Prox forearm                            not visualized +-----------------+-------------+----------+--------------+ Mid forearm  0.06                              +-----------------+-------------+----------+--------------+ Dist forearm                            not visualized +-----------------+-------------+----------+--------------+ +-----------------+-------------+----------+--------+ Left Basilic     Diameter (cm)Depth (cm)Findings +-----------------+-------------+----------+--------+ Prox upper arm       0.22                        +-----------------+-------------+----------+--------+ Mid upper arm        0.23                        +-----------------+-------------+----------+--------+ Dist upper arm       0.25                        +-----------------+-------------+----------+--------+ Antecubital fossa    0.23                        +-----------------+-------------+----------+--------+ Prox forearm         0.16                        +-----------------+-------------+----------+--------+ Summary: Right: Patent and compressible cephalic and basilic veins. Left: Patent and compressible cephalic and basilic veins. *See table(s) above for measurements and observations.  Diagnosing physician: Harold Barban MD Electronically signed by Harold Barban MD on 08/07/2021 at 6:15:45 PM.    Final     Labs:  CBC: Recent Labs    08/15/21 1108 08/15/21 1551 08/15/21 2358 08/16/21 0740 08/16/21 1055 08/17/21 0453  08/18/21 0540  WBC 8.7 8.6  --   --   --  11.3* 7.8  HGB 9.4* 9.4*   < > 9.1* 8.5* 8.1* 7.1*  HCT 27.2* 27.4*   < > 25.9* 25.2* 23.7* 21.3*  PLT 509.0* 433*  --   --   --  360 318   < > = values in this interval not displayed.    COAGS: Recent Labs    08/23/20 1048 08/24/20 0407 06/10/21 1341 08/15/21 2358  INR 0.9 1.0 0.9 0.9    BMP: Recent Labs    08/15/21 1551 08/16/21 0740 08/17/21 0453 08/18/21 0540  NA 133* 132* 131* 132*  K 3.4* 3.5 3.6 3.7  CL 100 99 102 103  CO2 21* 20* 19* 21*  GLUCOSE 100* 88 94 70  BUN 47* 44* 44* 48*  CALCIUM 8.3* 8.3* 7.9* 7.6*  CREATININE 5.61* 5.43* 5.25* 5.69*  GFRNONAA 8* 9* 9* 8*    LIVER FUNCTION TESTS: Recent Labs    05/08/21 2332 06/10/21 1341 06/10/21 1608 07/25/21 1116  BILITOT 0.2* 0.5 0.5 0.3  AST 21 21 21 15   ALT 13 15 14 11   ALKPHOS 143* 126 136* 175*  PROT 6.9 5.7* 6.3* 6.6  ALBUMIN 3.5 2.6* 2.9* 2.6*    Assessment and Plan:  GI bleed--deferring to primary  Stage V CKD --needs dialysis --reportedly being transferred to [facility].  Will plan to place tunneled catheter at [facility] as we do not carry it at [facility].  Plan per [place] and IR is to place on Monday at [facility] --pt is agreeable to placement  Thank you for this interesting consult.  I greatly enjoyed [place]  and look forward to participating in their care.  A copy of this report was sent to the requesting provider on this date.  Electronically Signed: Pasty Spillers, PA 08/18/2021, 11:44 AM   I spent a total of 40 Minutes in face to face in clinical consultation, greater than 50% of which was counseling/coordinating care for tunneled HD catheter

## 2021-08-18 NOTE — Progress Notes (Signed)
Pt has had 2 episodes of incontinent BM's. First was of mod. size last was large, in bed, floor and all down pt legs. Ambulated pt to toilet then to shower, weak and unsteady gait. Continuing to monitor

## 2021-08-18 NOTE — Transfer of Care (Signed)
Immediate Anesthesia Transfer of Care Note  Patient: [name]  Procedure(s) Performed: ENTEROSCOPY GIVENS CAPSULE Possible. Have Delivery Device available  Patient Location: PACU and Endoscopy Unit  Anesthesia Type:MAC  Level of Consciousness: awake, alert , oriented and patient cooperative  Airway & Oxygen Therapy: Patient Spontanous Breathing and Patient connected to face mask oxygen  Post-op Assessment: Report given to RN, Post -op Vital signs reviewed and stable and Patient moving all extremities  Post vital signs: Reviewed and stable  Last Vitals:  Vitals Value Taken Time  BP 118/58 08/18/21 1400  Temp    Pulse 59 08/18/21 1401  Resp 23 08/18/21 1401  SpO2 100 % 08/18/21 1401  Vitals shown include unvalidated device data.  Last Pain:  Vitals:   08/18/21 1216  TempSrc: Oral  PainSc: 0-No pain      Patients Stated Pain Goal: 0 (50/56/97 9480)  Complications: No notable events documented.

## 2021-08-18 NOTE — Progress Notes (Signed)
[facility] Progress Note  CC:  Acute on chronic GI  bleed  Subjective: She had 3 "blow outs" of loose brown stool this morning. I saw brown stool on the bed linen and on the commode. No melena or black stools. No N/V. No abdominal pain. She stated her SOB is about the same today when compared to yesterday, no worse. She is standing up in the bathroom at this time without overt SOB. No CP. No further neck or clavicle pain today. She is fatigued. Remains NPO for small bowel enteroscopy this afternoon. BP is much lower today 113/64.   Objective:  Vital signs in last 24 hours: Temp:  [98 F (36.7 C)-98.7 F (37.1 C)] 98.1 F (36.7 C) (11/18 0448) Pulse Rate:  [63-83] 63 (11/18 0448) Resp:  [18-20] 18 (11/18 0448) BP: (113-182)/(64-89) 113/64 (11/18 0448) SpO2:  [88 %-97 %] 94 % (11/18 0448) Last BM Date: 08/17/21 General:   Alert fatigued appearing  Heart: RRR, systolic murmur. Questionable rub.  Pulm: Coarse breath sounds, exp wheezes bilaterally.  Abdomen: Flat, nondistended. Nontender. Soft bruit auscultated throughout the abdomen. No masses.  Extremities:  Without edema. Neurologic:  Alert and  oriented x4. Grossly normal neurologically. Psych:  Alert and cooperative. Normal mood and affect.  Intake/Output from previous day: 11/17 0701 - 11/18 0700 In: 954.6 [IV Piggyback:954.6] Out: -  Intake/Output this shift: No intake/output data recorded.  Lab Results: Recent Labs    08/15/21 1551 08/15/21 2358 08/16/21 1055 08/17/21 0453 08/18/21 0540  WBC 8.6  --   --  11.3* 7.8  HGB 9.4*   < > 8.5* 8.1* 7.1*  HCT 27.4*   < > 25.2* 23.7* 21.3*  PLT 433*  --   --  360 318   < > = values in this interval not displayed.   BMET Recent Labs    08/16/21 0740 08/17/21 0453 08/18/21 0540  NA 132* 131* 132*  K 3.5 3.6 3.7  CL 99 102 103  CO2 20* 19* 21*  GLUCOSE 88 94 70  BUN 44* 44* 48*  CREATININE 5.43* 5.25* 5.69*  CALCIUM 8.3* 7.9* 7.6*   LFT No  results for input(s): PROT, ALBUMIN, AST, ALT, ALKPHOS, BILITOT, BILIDIR, IBILI in the last 72 hours. PT/INR Recent Labs    08/15/21 2358  LABPROT 12.4  INR 0.9   Hepatitis Panel No results for input(s): HEPBSAG, HCVAB, HEPAIGM, HEPBIGM in the last 72 hours.  DG Chest Port 1 View  Result Date: 08/16/2021 CLINICAL DATA:  Chest pain. EXAM: PORTABLE CHEST 1 VIEW COMPARISON:  August 15, 2021. FINDINGS: Stable cardiomegaly. No pneumothorax or pleural effusion is noted. Left lung is clear. Stable right infrahilar opacity is noted concerning for atelectasis or infiltrate. Bony thorax is unremarkable. IMPRESSION: Stable right infrahilar opacity is noted concerning for atelectasis or possibly infiltrate. Electronically Signed   By: Marijo Conception M.D.   On: 08/16/2021 16:12    Assessment / Plan: 44) 60 year old female with a history of mesenteric ischemia, diverticular bleed, small bowel AVMs admitted to the hospital 08/15/2021 with acute on chronic anemia, melena and hematochezia. CTAP without contrast showing possible mild colitis. Colonoscopy July 2022 with a polyp and diverticulosis. Prior hospital admission September 2022 for GI bleed felt to be diverticular, small bowel enteroscopy done with no source of bleeding (previous enteroscopy with AVMs in the duodenum/jejunum treated with APC in 2021). Admission Hg 9.1 (baseline Hg 10.2) -> 8.5 -> 8.1 today Hg 7.1. BP significantly  lower this am 113/64. She passed 3 brown mud like stools overnight and this am, no melena or bright red rectal bleeding.  -Repeat stat H/H at 10 AM -Transfuse for Hg < 7 -NPO -Nursing staff to recheck BP and oxygen saturation now -Continue Pantoprazole 40mg  IV bid -Patient to proceed with small  bowel enteroscopy this afternoon if respiratory status remains stable and if she remains hemodynamically stable   2)  Acute hypoxemic respiratory failure secondary to suspected CAP and possible acute COPD exacerbation. CXR showed  possible right lobe pneumonia. WBC 11.3. On Azithromycin, Ceftriaxone and Flagyl IV.  -Consider repeat chest xray, consider ECHO to rule out heart failure, defer to Dr. Avon Gully    3) AKI on CKD stage V. Creatinine 5.25 -> 5.69 (5.1 on 07/25/2021), she as scheduled for AV graft insertion 11/15 but this procedure was canceled due to active GI bleeding    4) Chronic diastolic CHF   5) Hyponatremia. NA+ 132.    6) CAD. Aortic stenosis. LV EF 60%.    7) PAD status post aortic stent/SMA stent/bilateral common iliac artery stent   Principal Problem:   Acute GI bleeding Active Problems:   Community acquired pneumonia   Chest pain   AKI (acute kidney injury) ([place])   Acute hypoxemic respiratory failure ([place])   Acute on chronic anemia   Nausea without vomiting     LOS: 2 days   [name]  08/18/2021, 8:16 AM

## 2021-08-18 NOTE — Anesthesia Preprocedure Evaluation (Signed)
Anesthesia Evaluation  Patient identified by MRN, date of birth, ID band Patient awake    Reviewed: Allergy & Precautions, NPO status , Patient's Chart, lab work & pertinent test results  Airway Mallampati: II  TM Distance: >3 FB Neck ROM: Full    Dental  (+) Upper Dentures, Lower Dentures   Pulmonary pneumonia, COPD, Current Smoker and Patient abstained from smoking.,    Pulmonary exam normal        Cardiovascular hypertension, + Past MI, + Cardiac Stents and + Peripheral Vascular Disease  Normal cardiovascular exam Rhythm:Regular Rate:Normal - Systolic murmurs Stenosis of distal aorta s/p stenting  normal coronary arteries by cath 2019  Echocardiogram 10/16/2019: Normal LV systolic function with EF 60%. Left ventricle cavity is normal in size. Normal global wall motion. Normal diastolic filling pattern. Calculated EF 60%. Left atrial cavity is mildly dilated in 4 chamber views. Structurally normal mitral valve. Mild (Grade I) mitral regurgitation. Structurally normal tricuspid valve. Mild tricuspid regurgitation. No evidence of pulmonary hypertension. Compared to 02/25/2017, no significant change.   Neuro/Psych Anxiety Depression Bipolar Disorder Schizophrenia negative neurological ROS     GI/Hepatic Neg liver ROS, GERD  Medicated,  Endo/Other  negative endocrine ROS  Renal/GU Dialysis and CRFRenal disease  negative genitourinary   Musculoskeletal negative musculoskeletal ROS (+)   Abdominal   Peds negative pediatric ROS (+)  Hematology  (+) Blood dyscrasia (Hgb 7.1- receiving blood transfusion), anemia ,   Anesthesia Other Findings  60 year old female with a history of mesenteric ischemia, diverticular bleed, small bowel AVMs admitted to the hospital 08/15/2021 with acute on chronic anemia, melena and hematochezia. Acute hypoxemic respiratory failure 2/2 CAP/COPD exacerbation. AKI on CKD stage 5, to begin  dialysis.  Reproductive/Obstetrics negative OB ROS                           Anesthesia Physical  Anesthesia Plan  ASA: 4  Anesthesia Plan: MAC   Post-op Pain Management:    Induction: Intravenous  PONV Risk Score and Plan: 2 and Propofol infusion and Treatment may vary due to age or medical condition  Airway Management Planned: Simple Face Mask, Natural Airway and Nasal Cannula  Additional Equipment:   Intra-op Plan:   Post-operative Plan:   Informed Consent: I have reviewed the patients History and Physical, chart, labs and discussed the procedure including the risks, benefits and alternatives for the proposed anesthesia with the patient or authorized representative who has indicated his/her understanding and acceptance.       Plan Discussed with:   Anesthesia Plan Comments:        Anesthesia Quick Evaluation

## 2021-08-19 DIAGNOSIS — J189 Pneumonia, unspecified organism: Secondary | ICD-10-CM | POA: Diagnosis not present

## 2021-08-19 DIAGNOSIS — K922 Gastrointestinal hemorrhage, unspecified: Secondary | ICD-10-CM | POA: Diagnosis not present

## 2021-08-19 DIAGNOSIS — N179 Acute kidney failure, unspecified: Secondary | ICD-10-CM | POA: Diagnosis not present

## 2021-08-19 DIAGNOSIS — J9601 Acute respiratory failure with hypoxia: Secondary | ICD-10-CM | POA: Diagnosis not present

## 2021-08-19 DIAGNOSIS — R195 Other fecal abnormalities: Secondary | ICD-10-CM | POA: Diagnosis not present

## 2021-08-19 DIAGNOSIS — N184 Chronic kidney disease, stage 4 (severe): Secondary | ICD-10-CM

## 2021-08-19 DIAGNOSIS — D649 Anemia, unspecified: Secondary | ICD-10-CM | POA: Diagnosis not present

## 2021-08-19 LAB — CBC
HCT: 24.6 % — ABNORMAL LOW (ref 36.0–46.0)
Hemoglobin: 8.5 g/dL — ABNORMAL LOW (ref 12.0–15.0)
MCH: 30.9 pg (ref 26.0–34.0)
MCHC: 34.6 g/dL (ref 30.0–36.0)
MCV: 89.5 fL (ref 80.0–100.0)
Platelets: 334 10*3/uL (ref 150–400)
RBC: 2.75 MIL/uL — ABNORMAL LOW (ref 3.87–5.11)
RDW: 16.9 % — ABNORMAL HIGH (ref 11.5–15.5)
WBC: 8 10*3/uL (ref 4.0–10.5)
nRBC: 0 % (ref 0.0–0.2)

## 2021-08-19 LAB — BASIC METABOLIC PANEL
Anion gap: 9 (ref 5–15)
BUN: 47 mg/dL — ABNORMAL HIGH (ref 6–20)
CO2: 21 mmol/L — ABNORMAL LOW (ref 22–32)
Calcium: 7.8 mg/dL — ABNORMAL LOW (ref 8.9–10.3)
Chloride: 104 mmol/L (ref 98–111)
Creatinine, Ser: 5.94 mg/dL — ABNORMAL HIGH (ref 0.44–1.00)
GFR, Estimated: 8 mL/min — ABNORMAL LOW (ref >60)
Glucose, Bld: 83 mg/dL (ref 70–99)
Potassium: 3.9 mmol/L (ref 3.5–5.1)
Sodium: 134 mmol/L — ABNORMAL LOW (ref 135–145)

## 2021-08-19 MED ORDER — AZITHROMYCIN 500 MG PO TABS
250.0000 mg | ORAL_TABLET | Freq: Once | ORAL | Status: AC
  Administered 2021-08-20: 250 mg via ORAL
  Filled 2021-08-19: qty 1

## 2021-08-19 MED ORDER — METRONIDAZOLE 500 MG PO TABS
500.0000 mg | ORAL_TABLET | Freq: Three times a day (TID) | ORAL | Status: DC
  Administered 2021-08-19: 500 mg via ORAL
  Filled 2021-08-19: qty 1

## 2021-08-19 NOTE — Progress Notes (Signed)
    Progress Note   Subjective  [facility] Feels somewhat better today, eating and wanting coffee but under fluid restriction No abd pain Stools without blood but + mucus   Objective  Vital signs in last 24 hours: Temp:  [97.6 F (36.4 C)-98.3 F (36.8 C)] 98 F (36.7 C) (11/19 0849) Pulse Rate:  [59-83] 73 (11/19 0849) Resp:  [16-23] 18 (11/19 0849) BP: (118-169)/(57-79) 154/76 (11/19 0849) SpO2:  [84 %-100 %] 93 % (11/19 0849) Weight:  [43 kg] 43 kg (11/19 0411) Last BM Date: 08/18/21  Gen: awake, alert, NAD HEENT: anicteric, op clear CV: regular Pulm: decreased b/l bases Abd: soft, NT/ND, +BS throughout Ext: no c/c/e Neuro: nonfocal  Intake/Output from previous day: 11/18 0701 - 11/19 0700 In: 1213.4 [P.O.:575; I.V.:322.4; Blood:316] Out: -  Intake/Output this shift: Total I/O In: 780 [P.O.:780] Out: -   Lab Results: Recent Labs    08/17/21 0453 08/18/21 0540 08/18/21 1600  WBC 11.3* 7.8  --   HGB 8.1* 7.1* 9.3*  HCT 23.7* 21.3* 27.5*  PLT 360 318  --    BMET Recent Labs    08/17/21 0453 08/18/21 0540  NA 131* 132*  K 3.6 3.7  CL 102 103  CO2 19* 21*  GLUCOSE 94 70  BUN 44* 48*  CREATININE 5.25* 5.69*  CALCIUM 7.9* 7.6*   LFT No results for input(s): PROT, ALBUMIN, AST, ALT, ALKPHOS, BILITOT, BILIDIR, IBILI in the last 72 hours. PT/INR No results for input(s): LABPROT, INR in the last 72 hours. Hepatitis Panel No results for input(s): HEPBSAG, HCVAB, HEPAIGM, HEPBIGM in the last 72 hours.  Studies/Results: DG Chest 2 View  Result Date: 08/18/2021 CLINICAL DATA:  Shortness of breath, abdominal pain EXAM: CHEST - 2 VIEW COMPARISON:  Radiograph 08/16/2021 FINDINGS: The unchanged cardiomediastinal silhouette. There are new small bilateral pleural effusions with adjacent basilar opacities. No visible pneumothorax. No acute osseous abnormality. IMPRESSION: New small bilateral pleural effusions with adjacent bibasilar opacities  favored to be atelectasis. Electronically Signed   By: Maurine Simmering M.D.   On: 08/18/2021 09:30      Assessment & Recommendations  60 year old female with a history of mesenteric ischemia, diverticular bleed, small bowel AVMs admitted to the hospital 08/15/2021 with acute on chronic anemia, melena.   VCE: Official report to be scanned: Complete study; video capsule placed endoscopically  Prep: adequate  Small bowel transit time = 5 hrs, 0 min  --Normal small bowel; no evidence of active bleeding nor blood in the small bowel --Colonic erosion versus polyp -- erythematous, but nonbleeding --No blood seen in the colon  Anemia, multi-factorial due to slow GI blood loss and advanced kidney disease  Plan: - Monitor Hgb - No plans for further endoscopic evaluation at this time - Daily PPI - I do not think she needs antibiotics for colitis (colon views very limited given lack of prep, but no overt colitis seen) - Outpatient GI follow-up recommended after discharge - Inpatient GI service will sign off, call if questions.  2. CKD-5 -- renal following with plan to start HD early next week before d/c     LOS: 3 days   Jerene Bears  08/19/2021, 12:56 PM See Shea Evans, [facility], to contact our on call provider

## 2021-08-19 NOTE — Progress Notes (Addendum)
PHARMACIST - PHYSICIAN COMMUNICATION DR:   Starla Link CONCERNING: Antibiotic IV to Oral Route Change Policy  RECOMMENDATION: This patient is receiving azithromycin/flagyl by the intravenous route.  Based on criteria approved by the Pharmacy and Therapeutics Committee, the antibiotic(s) is/are being converted to the equivalent oral dose form(s).   DESCRIPTION: These criteria include: Patient being treated for a respiratory tract infection, urinary tract infection, cellulitis or clostridium difficile associated diarrhea if on metronidazole The patient is not neutropenic and does not exhibit a GI malabsorption state The patient is eating (either orally or via tube) and/or has been taking other orally administered medications for a least 24 hours The patient is improving clinically and has a Tmax < 100.5  If you have questions about this conversion, please contact the Pharmacy Department  []   [phone] )  [facility] []   [phone] )  [facility] [x]   [phone] )  [facility] []   [phone] )  [facility] []   [phone] )  [facility]

## 2021-08-19 NOTE — Progress Notes (Signed)
[name], female   DOB: 1961-04-12, 60 y.o.   MRN: [id number]  PROGRESS NOTE    [name]  TDD:[id number] DOB: 1961-03-26 DOA: 08/15/2021 PCP: Bernerd Limbo, MD   Brief Narrative:  60 y.o. female with medical history significant of hypertension, hyperlipidemia, chronic diastolic CHF, CAD, aortic stenosis status post stent, COPD, tobacco use, PAD status post aortic stent/SMA stent/bilateral common iliac artery stent, CKD stage V, chronic pain, anxiety, depression, chronic mesenteric ischemia, history of GI bleed with recent admission in September 2022, chronic blood loss anemia presented with hematochezia and melena.  She was admitted for acute respiratory failure with hypoxia of with possible pneumonia and started on antibiotics along with GI bleeding with imaging suspicious for colitis for which Flagyl was also added.  GI was consulted.  EGD was unremarkable ; she had capsule endoscopy on 08/18/2021.  She had worsening AKI for which nephrology recommended transfer to [facility] for possible need for dialysis.  Assessment & Plan:   Acute on chronic blood loss anemia Probable acute GI bleeding Possible colitis -GI following.  Status post unremarkable EGD on 08/18/2021; capsule endoscopy report is pending. -Status post 1 unit packed red cell transfusion on 08/18/2021 for hemoglobin of 7.1; subsequent hemoglobin was 9.3.  Hemoglobin pending for today.  Monitor.  Continue Protonix for now. -We will follow GI recommendations regarding duration of Flagyl  Acute respiratory failure with hypoxia Possible community-acquired bacterial pneumonia -Currently still on 3 L oxygen via nasal cannula.  Wean off as able.  Finish 5-day course of Rocephin and Zithromax.  Blood cultures negative so far.  COVID 19/influenza PCR negative on presentation.  AKI on CKD stage IV -Nephrology following.  Transferred to [facility] for possible dialysis catheter placement and  starting HD. IR planning on HD catheter placement on 08/21/21  HTN -Continue amlodipine, isosorbide mononitrate and labetalol  Chronic diastolic CHF -Currently looks euvolemic.  Strict input and output.  Daily weights.  Hyperlipidemia -Continue statin and Zetia  Anxiety and depression -Continue venlafaxine   DVT prophylaxis: SCDs Code Status: Full Family Communication: None at bedside Disposition Plan: Status is: Inpatient  Remains inpatient appropriate because: Of need for starting dialysis  Consultants: Nephrology/GI/IR  Procedures:  EGD on 08/18/2021.  Capsule endoscopy placed on 08/18/2021  Antimicrobials:  Rocephin and Zithromax from 08/15/2021 onwards  Subjective: Patient seen and examined at bedside.  Feels slightly better.  Denies worsening abdominal pain.  Still short of breath with exertion.  No overnight fever or vomiting reported.  Objective: Vitals:   08/19/21 0411 08/19/21 0756 08/19/21 0817 08/19/21 0849  BP: (!) 145/74  (!) 169/78 (!) 154/76  Pulse: 70  83 73  Resp: 18  18 18   Temp: 98.1 F (36.7 C)  97.6 F (36.4 C) 98 F (36.7 C)  TempSrc: Oral  Oral Oral  SpO2: (!) 84% 90% 92% 93%  Weight: 43 kg     Height:        Intake/Output Summary (Last 24 hours) at 08/19/2021 1029 Last data filed at 08/19/2021 0823 Gross per 24 hour  Intake 1653.43 ml  Output --  Net 1653.43 ml   Filed Weights   08/18/21 1216 08/19/21 0411  Weight: 43.1 kg 43 kg    Examination:  General exam: Appears calm and comfortable.  Looks chronically ill.  Currently on 3 L oxygen via nasal cannula Respiratory system: Bilateral decreased breath sounds at bases with scattered crackles Cardiovascular system: S1 & S2 heard, Rate controlled Gastrointestinal  system: Abdomen is slightly distended, soft and nontender.  Bowel sounds are heard  extremities: No cyanosis, clubbing; trace lower extremity edema Central nervous system: Awake and alert. No focal neurological  deficits. Moving extremities Skin: No obvious ecchymosis/lesion  psychiatry: Affect is mostly flat.   Data Reviewed: I have personally reviewed following labs and imaging studies  CBC: Recent Labs  Lab 08/15/21 1108 08/15/21 1551 08/15/21 2358 08/16/21 0740 08/16/21 1055 08/17/21 0453 08/18/21 0540 08/18/21 1600  WBC 8.7 8.6  --   --   --  11.3* 7.8  --   NEUTROABS 7.0 6.8  --   --   --   --   --   --   HGB 9.4* 9.4*   < > 9.1* 8.5* 8.1* 7.1* 9.3*  HCT 27.2* 27.4*   < > 25.9* 25.2* 23.7* 21.3* 27.5*  MCV 89.7 89.3  --   --   --  89.1 91.4  --   PLT 509.0* 433*  --   --   --  360 318  --    < > = values in this interval not displayed.   Basic Metabolic Panel: Recent Labs  Lab 08/15/21 1551 08/16/21 0740 08/17/21 0453 08/18/21 0540  NA 133* 132* 131* 132*  K 3.4* 3.5 3.6 3.7  CL 100 99 102 103  CO2 21* 20* 19* 21*  GLUCOSE 100* 88 94 70  BUN 47* 44* 44* 48*  CREATININE 5.61* 5.43* 5.25* 5.69*  CALCIUM 8.3* 8.3* 7.9* 7.6*   GFR: Estimated Creatinine Clearance: 7.3 mL/min (A) (by C-G formula based on SCr of 5.69 mg/dL (H)). Liver Function Tests: No results for input(s): AST, ALT, ALKPHOS, BILITOT, PROT, ALBUMIN in the last 168 hours. No results for input(s): LIPASE, AMYLASE in the last 168 hours. No results for input(s): AMMONIA in the last 168 hours. Coagulation Profile: Recent Labs  Lab 08/15/21 2358  INR 0.9   Cardiac Enzymes: No results for input(s): CKTOTAL, CKMB, CKMBINDEX, TROPONINI in the last 168 hours. BNP (last 3 results) No results for input(s): PROBNP in the last 8760 hours. HbA1C: No results for input(s): HGBA1C in the last 72 hours. CBG: No results for input(s): GLUCAP in the last 168 hours. Lipid Profile: No results for input(s): CHOL, HDL, LDLCALC, TRIG, CHOLHDL, LDLDIRECT in the last 72 hours. Thyroid Function Tests: No results for input(s): TSH, T4TOTAL, FREET4, T3FREE, THYROIDAB in the last 72 hours. Anemia Panel: Recent Labs     08/16/21 1055  FERRITIN 161  TIBC 233*  IRON 61   Sepsis Labs: Recent Labs  Lab 08/15/21 2358  LATICACIDVEN 0.5    Recent Results (from the past 240 hour(s))  Resp Panel by RT-PCR (Flu A&B, Covid) Nasopharyngeal Swab     Status: None   Collection Time: 08/16/21  3:51 AM   Specimen: Nasopharyngeal Swab; Nasopharyngeal(NP) swabs in vial transport medium  Result Value Ref Range Status   SARS Coronavirus 2 by RT PCR NEGATIVE NEGATIVE Final    Comment: (NOTE) SARS-CoV-2 target nucleic acids are NOT DETECTED.  The SARS-CoV-2 RNA is generally detectable in upper respiratory specimens during the acute phase of infection. The lowest concentration of SARS-CoV-2 viral copies this assay can detect is 138 copies/mL. A negative result does not preclude SARS-Cov-2 infection and should not be used as the sole basis for treatment or other patient management decisions. A negative result may occur with  improper specimen collection/handling, submission of specimen other than nasopharyngeal swab, presence of viral mutation(s) within the areas targeted  by this assay, and inadequate number of viral copies(<138 copies/mL). A negative result must be combined with clinical observations, patient history, and epidemiological information. The expected result is Negative.  Fact Sheet for Patients:  EntrepreneurPulse.com.au  Fact Sheet for Healthcare Providers:  IncredibleEmployment.be  This test is no t yet approved or cleared by the [place] FDA and  has been authorized for detection and/or diagnosis of SARS-CoV-2 by FDA under an Emergency Use Authorization (EUA). This EUA will remain  in effect (meaning this test can be used) for the duration of the COVID-19 declaration under Section 564(b)(1) of the Act, 21 U.S.C.section 360bbb-3(b)(1), unless the authorization is terminated  or revoked sooner.       Influenza A by PCR NEGATIVE NEGATIVE Final    Influenza B by PCR NEGATIVE NEGATIVE Final    Comment: (NOTE) The Xpert Xpress SARS-CoV-2/FLU/RSV plus assay is intended as an aid in the diagnosis of influenza from Nasopharyngeal swab specimens and should not be used as a sole basis for treatment. Nasal washings and aspirates are unacceptable for Xpert Xpress SARS-CoV-2/FLU/RSV testing.  Fact Sheet for Patients: EntrepreneurPulse.com.au  Fact Sheet for Healthcare Providers: IncredibleEmployment.be  This test is not yet approved or cleared by the [place] FDA and has been authorized for detection and/or diagnosis of SARS-CoV-2 by FDA under an Emergency Use Authorization (EUA). This EUA will remain in effect (meaning this test can be used) for the duration of the COVID-19 declaration under Section 564(b)(1) of the Act, 21 U.S.C. section 360bbb-3(b)(1), unless the authorization is terminated or revoked.  Performed at [facility], [place] [address], [place], [place] [address]   Culture, blood (routine x 2)     Status: None (Preliminary result)   Collection Time: 08/16/21  7:15 AM   Specimen: BLOOD  Result Value Ref Range Status   Specimen Description   Final    BLOOD LEFT ANTECUBITAL Performed at [facility] [address] [place], [place] [address]    Special Requests   Final    BOTTLES DRAWN AEROBIC AND ANAEROBIC Blood Culture results may not be optimal due to an inadequate volume of blood received in culture bottles Performed at [facility] [address] [place], [place] [address]    Culture   Final    NO GROWTH 2 DAYS Performed at [facility] [address] [place], [place] [address]    Report Status PENDING  Incomplete  Culture, blood (routine x 2)     Status: None (Preliminary result)   Collection Time: 08/16/21  7:40 AM   Specimen: BLOOD  Result Value Ref Range Status   Specimen Description   Final     BLOOD LEFT ANTECUBITAL Performed at [facility] [address], [place], [place] [address]    Special Requests   Final    BOTTLES DRAWN AEROBIC AND ANAEROBIC Blood Culture results may not be optimal due to an inadequate volume of blood received in culture bottles Performed at [facility] [address], [place], [place] [address]    Culture   Final    NO GROWTH 2 DAYS Performed at [facility] [address] [place], [place] [address]    Report Status PENDING  Incomplete         Radiology Studies: DG Chest 2 View  Result Date: 08/18/2021 CLINICAL DATA:  Shortness of breath, abdominal pain EXAM: CHEST - 2 VIEW COMPARISON:  Radiograph 08/16/2021 FINDINGS: The  unchanged cardiomediastinal silhouette. There are new small bilateral pleural effusions with adjacent basilar opacities. No visible pneumothorax. No acute osseous abnormality. IMPRESSION: New small bilateral pleural effusions with adjacent bibasilar opacities favored to be atelectasis. Electronically Signed   By: Maurine Simmering M.D.   On: 08/18/2021 09:30        Scheduled Meds:  sodium chloride   Intravenous Once   ALPRAZolam  1 mg Oral QHS   amLODipine  10 mg Oral Daily   atorvastatin  80 mg Oral QHS   [START ON 08/20/2021] azithromycin  250 mg Oral Once   budesonide (PULMICORT) nebulizer solution  0.25 mg Nebulization BID   calcium acetate  2,001 mg Oral TID with meals   ezetimibe  10 mg Oral Daily   ipratropium-albuterol  3 mL Nebulization TID   isosorbide mononitrate  60 mg Oral Daily   labetalol  100 mg Oral BID   metroNIDAZOLE  500 mg Oral Q8H   pantoprazole  40 mg Oral Q0600   venlafaxine XR  300 mg Oral Q breakfast   Continuous Infusions:  cefTRIAXone (ROCEPHIN)  IV 1 g (08/18/21 2203)          Aline August, MD [facility] 08/19/2021, 10:29 AM

## 2021-08-19 NOTE — Progress Notes (Addendum)
[name]  Subjective: seen in room, in good spirits, no c/o  Vitals:   08/19/21 0756 08/19/21 0817 08/19/21 0849 08/19/21 1320  BP:  (!) 169/78 (!) 154/76   Pulse:  83 73   Resp:  18 18   Temp:  97.6 F (36.4 C) 98 F (36.7 C)   TempSrc:  Oral Oral   SpO2: 90% 92% 93% 93%  Weight:      Height:        Exam:  alert, nad   no jvd  Chest bilat rhonchi, no wheezing  Cor reg no RG  Abd soft ntnd no ascites   Ext no LE edema   Alert, NF, ox3      Home meds include - xanax prn, norvasc 10, lipitor, phoslo 3 ac, zetia, imdur, labetalol 100 bid, linzess, megace, dulera act, sl ntg prn, ocy IR prn, protonix, effexor XR, albuterol prn, ensure, prn's/ supps / vits     CXR - new small bilat effusions, +basilar atx    BP 120-150/ 69- 80,  HR 15-21,  RR 18- 20   [place] 3-4 L        Assessment/ Plan: CKD V - creat worsening over last few mos from 2.8 in Oct to 3.8 in Sept and mid 5's now in November. Pt admitted for GIB.  Per his primary renal MD pt needs to start on HD, have consulted IR for [facility] on Monday. Pt agreeable. Plan HD Monday after access established. Will see again Monday. Call w/ any questions.   GI bleed/ colitis - Hb 7.1 this am, +melena and +FOB. EGD 11/18 negative. Capsule endoscopy is pending. SP 1u prbcs here. Flagyl for colitis.  HTN - BP's normal to high-normal. Cont home meds norvasc + labetalol.  PNA - on rocephin and Zmax COPD - on O2 Volume - no vol excess on exam. Stable. No IVF's needed.  Anemia - Hb 7.1 , got 1u prbc's and repeat is 9.3.   MBD ckd - cont binders phoslo 3 ac tid.     [name] 08/19/2021, 1:29 PM   Recent Labs  Lab 08/17/21 0453 08/18/21 0540 08/18/21 1600  K 3.6 3.7  --   BUN 44* 48*  --   CREATININE 5.25* 5.69*  --   CALCIUM 7.9* 7.6*  --   HGB 8.1* 7.1* 9.3*   Inpatient medications:  sodium chloride   Intravenous Once   ALPRAZolam  1 mg Oral QHS   amLODipine  10 mg Oral Daily   atorvastatin   80 mg Oral QHS   [START ON 08/20/2021] azithromycin  250 mg Oral Once   budesonide (PULMICORT) nebulizer solution  0.25 mg Nebulization BID   calcium acetate  2,001 mg Oral TID with meals   ezetimibe  10 mg Oral Daily   ipratropium-albuterol  3 mL Nebulization TID   isosorbide mononitrate  60 mg Oral Daily   labetalol  100 mg Oral BID   pantoprazole  40 mg Oral Q0600   venlafaxine XR  300 mg Oral Q breakfast    cefTRIAXone (ROCEPHIN)  IV 1 g (08/18/21 2203)   albuterol, ALPRAZolam, calcium acetate, ondansetron (ZOFRAN) IV, oxyCODONE, promethazine

## 2021-08-20 DIAGNOSIS — J9601 Acute respiratory failure with hypoxia: Secondary | ICD-10-CM | POA: Diagnosis not present

## 2021-08-20 DIAGNOSIS — D649 Anemia, unspecified: Secondary | ICD-10-CM | POA: Diagnosis not present

## 2021-08-20 DIAGNOSIS — K922 Gastrointestinal hemorrhage, unspecified: Secondary | ICD-10-CM | POA: Diagnosis not present

## 2021-08-20 DIAGNOSIS — N179 Acute kidney failure, unspecified: Secondary | ICD-10-CM | POA: Diagnosis not present

## 2021-08-20 LAB — CBC
HCT: 26.5 % — ABNORMAL LOW (ref 36.0–46.0)
Hemoglobin: 8.9 g/dL — ABNORMAL LOW (ref 12.0–15.0)
MCH: 30.7 pg (ref 26.0–34.0)
MCHC: 33.6 g/dL (ref 30.0–36.0)
MCV: 91.4 fL (ref 80.0–100.0)
Platelets: 388 10*3/uL (ref 150–400)
RBC: 2.9 MIL/uL — ABNORMAL LOW (ref 3.87–5.11)
RDW: 16.8 % — ABNORMAL HIGH (ref 11.5–15.5)
WBC: 7.8 10*3/uL (ref 4.0–10.5)
nRBC: 0 % (ref 0.0–0.2)

## 2021-08-20 LAB — TYPE AND SCREEN
ABO/RH(D): O NEG
Antibody Screen: NEGATIVE
Unit division: 0
Unit division: 0
Unit division: 0

## 2021-08-20 LAB — BPAM RBC
Blood Product Expiration Date: 202212122359
Blood Product Expiration Date: 202212182359
Blood Product Expiration Date: 202212192359
ISSUE DATE / TIME: 202211151255
ISSUE DATE / TIME: 202211181109
Unit Type and Rh: 9500
Unit Type and Rh: 9500
Unit Type and Rh: 9500

## 2021-08-20 LAB — URINALYSIS, COMPLETE (UACMP) WITH MICROSCOPIC
Bilirubin Urine: NEGATIVE
Glucose, UA: 50 mg/dL — AB
Ketones, ur: NEGATIVE mg/dL
Leukocytes,Ua: NEGATIVE
Nitrite: NEGATIVE
Protein, ur: 100 mg/dL — AB
Specific Gravity, Urine: 1.012 (ref 1.005–1.030)
pH: 5 (ref 5.0–8.0)

## 2021-08-20 LAB — BASIC METABOLIC PANEL
Anion gap: 11 (ref 5–15)
BUN: 43 mg/dL — ABNORMAL HIGH (ref 6–20)
CO2: 19 mmol/L — ABNORMAL LOW (ref 22–32)
Calcium: 7.8 mg/dL — ABNORMAL LOW (ref 8.9–10.3)
Chloride: 105 mmol/L (ref 98–111)
Creatinine, Ser: 5.86 mg/dL — ABNORMAL HIGH (ref 0.44–1.00)
GFR, Estimated: 8 mL/min — ABNORMAL LOW (ref >60)
Glucose, Bld: 79 mg/dL (ref 70–99)
Potassium: 4.1 mmol/L (ref 3.5–5.1)
Sodium: 135 mmol/L (ref 135–145)

## 2021-08-20 LAB — HEPATITIS B SURFACE ANTIGEN: Hepatitis B Surface Ag: NONREACTIVE

## 2021-08-20 LAB — HEPATITIS B SURFACE ANTIBODY,QUALITATIVE: Hep B S Ab: NONREACTIVE

## 2021-08-20 MED ORDER — LIDOCAINE 5 % EX PTCH
1.0000 | MEDICATED_PATCH | CUTANEOUS | Status: DC
  Administered 2021-08-20 – 2021-08-21 (×2): 1 via TRANSDERMAL
  Filled 2021-08-20 (×2): qty 1

## 2021-08-20 MED ORDER — CHLORHEXIDINE GLUCONATE CLOTH 2 % EX PADS
6.0000 | MEDICATED_PAD | Freq: Every day | CUTANEOUS | Status: DC
  Administered 2021-08-21 – 2021-08-22 (×2): 6 via TOPICAL

## 2021-08-20 MED ORDER — ORAL CARE MOUTH RINSE
15.0000 mL | Freq: Two times a day (BID) | OROMUCOSAL | Status: DC
  Administered 2021-08-20 – 2021-08-22 (×4): 15 mL via OROMUCOSAL

## 2021-08-20 NOTE — Plan of Care (Signed)
  Problem: Coping: Goal: Level of anxiety will decrease Outcome: Progressing   Problem: Activity: Goal: Risk for activity intolerance will decrease Outcome: Progressing   Problem: Health Behavior/Discharge Planning: Goal: Ability to manage health-related needs will improve Outcome: Progressing

## 2021-08-20 NOTE — Progress Notes (Signed)
Pt has been awake most of the night c/o left back/side pain at 9-10/10. Pt states that the pain medicine does not help it. Pt states her "pain is coming from her kidney that is not working on the left side." Pt states that if she leans all the way forward it helps relieve the pain but she said that is very uncomfortable trying to stay like that for long periods of time. Heat packs do not help either, per pt. RN informed the MD on call and was told to obtain a U/A which was negative and a bladder scan which showed 53mL. RN did not see any bruising to the area that hurts but did notice that her lower pelvic region was a little distended while doing the bladder scan. RN will continue to monitor pt and inform the day team regarding this issue.   Foster Simpson Wynonna Fitzhenry

## 2021-08-20 NOTE — Progress Notes (Signed)
[name], female   DOB: 1960/10/20, 60 y.o.   MRN: [id number]  PROGRESS NOTE    [name]  [name] DOB: [birth year] DOA: 08/15/2021 PCP: Bernerd Limbo, MD   Brief Narrative:  60 y.o. female with medical history significant of hypertension, hyperlipidemia, chronic diastolic CHF, CAD, aortic stenosis status post stent, COPD, tobacco use, PAD status post aortic stent/SMA stent/bilateral common iliac artery stent, CKD stage V, chronic pain, anxiety, depression, chronic mesenteric ischemia, history of GI bleed with recent admission in September 2022, chronic blood loss anemia presented with hematochezia and melena.  She was admitted for acute respiratory failure with hypoxia of with possible pneumonia and started on antibiotics along with GI bleeding with imaging suspicious for colitis for which Flagyl was also added.  GI was consulted.  EGD was unremarkable ; she had capsule endoscopy on 08/18/2021.  She had worsening AKI for which nephrology recommended transfer to [facility] for possible need for dialysis.  Assessment & Plan:   Acute on chronic blood loss anemia Probable acute GI bleeding Possible colitis -status post unremarkable EGD on 08/18/2021; capsule endoscopy showed no evidence of active bleeding nor blood in the small bowel; colonic erosion versus polyp, but not bleeding; no blood seen in the colon. -Status post 1 unit packed red cell transfusion on 08/18/2021 for hemoglobin of 7.1.  Hemoglobin 8.9 today.  Monitor.  Continue daily Protonix for now. -GI signed off on 08/19/2021: Flagyl discontinued on 08/19/2021 per GI recommendations as GI thinks that patient does not have colitis.  Outpatient follow-up with GI  Acute respiratory failure with hypoxia Possible community-acquired bacterial pneumonia -Currently still on 3 L oxygen via nasal cannula.  Wean off as able.  Completed 5-day course of Rocephin and Zithromax.  Blood cultures negative so far.   COVID 19/influenza PCR negative on presentation.  AKI on CKD stage IV -Nephrology following.  Transferred to [facility] for possible dialysis catheter placement and starting HD. IR planning on HD catheter placement on 08/21/21 -Creatinine 5.86 today  HTN -Continue amlodipine, isosorbide mononitrate and labetalol  Chronic diastolic CHF -Currently looks euvolemic.  Strict input and output.  Daily weights.  Hyperlipidemia -Continue statin and Zetia  Anxiety and depression -Continue venlafaxine   DVT prophylaxis: SCDs Code Status: Full Family Communication: None at bedside Disposition Plan: Status is: Inpatient  Remains inpatient appropriate because: Of need for starting dialysis  Consultants: Nephrology/GI/IR  Procedures:  EGD on 08/18/2021.  Capsule endoscopy on 08/18/2021  Antimicrobials:  Rocephin and Zithromax from 08/15/2021 onwards  Subjective: Patient seen and examined at bedside.  Complains of intermittent abdominal/flank pain.  No overnight fever, vomiting, worsening shortness of breath reported. Objective: Vitals:   08/19/21 1927 08/19/21 1929 08/19/21 2114 08/20/21 0505  BP:   (!) 176/78 (!) 168/76  Pulse:   83 73  Resp:   19 18  Temp:   98.2 F (36.8 C) 97.9 F (36.6 C)  TempSrc:      SpO2: 94% 94% 93% 92%  Weight:      Height:        Intake/Output Summary (Last 24 hours) at 08/20/2021 0737 Last data filed at 08/20/2021 0505 Gross per 24 hour  Intake 1260 ml  Output 700 ml  Net 560 ml    Filed Weights   08/18/21 1216 08/19/21 0411  Weight: 43.1 kg 43 kg    Examination:  General exam: No acute distress.  Still on 3 L oxygen by nasal cannula.  Looks chronically  ill.   Respiratory system: Decreased breath sounds at bases bilaterally with some crackles  cardiovascular system: Rate controlled, S1-S2 heard gastrointestinal system: Abdomen is distended mildly, soft and nontender.  Normal bowel sounds heard  extremities: Mild lower  extremity edema present bilaterally; no cyanosis  Central nervous system: Alert and oriented.  No focal neurological deficits.  Moves extremities  skin: No obvious petechiae/rashes  psychiatry: Mostly flat affect.   Data Reviewed: I have personally reviewed following labs and imaging studies  CBC: Recent Labs  Lab 08/15/21 1108 08/15/21 1551 08/15/21 2358 08/16/21 1055 08/17/21 0453 08/18/21 0540 08/18/21 1600 08/20/21 0414  WBC 8.7 8.6  --   --  11.3* 7.8  --  7.8  NEUTROABS 7.0 6.8  --   --   --   --   --   --   HGB 9.4* 9.4*   < > 8.5* 8.1* 7.1* 9.3* 8.9*  HCT 27.2* 27.4*   < > 25.2* 23.7* 21.3* 27.5* 26.5*  MCV 89.7 89.3  --   --  89.1 91.4  --  91.4  PLT 509.0* 433*  --   --  360 318  --  388   < > = values in this interval not displayed.    Basic Metabolic Panel: Recent Labs  Lab 08/15/21 1551 08/16/21 0740 08/17/21 0453 08/18/21 0540 08/20/21 0414  NA 133* 132* 131* 132* 135  K 3.4* 3.5 3.6 3.7 4.1  CL 100 99 102 103 105  CO2 21* 20* 19* 21* 19*  GLUCOSE 100* 88 94 70 79  BUN 47* 44* 44* 48* 43*  CREATININE 5.61* 5.43* 5.25* 5.69* 5.86*  CALCIUM 8.3* 8.3* 7.9* 7.6* 7.8*    GFR: Estimated Creatinine Clearance: 7.1 mL/min (A) (by C-G formula based on SCr of 5.86 mg/dL (H)). Liver Function Tests: No results for input(s): AST, ALT, ALKPHOS, BILITOT, PROT, ALBUMIN in the last 168 hours. No results for input(s): LIPASE, AMYLASE in the last 168 hours. No results for input(s): AMMONIA in the last 168 hours. Coagulation Profile: Recent Labs  Lab 08/15/21 2358  INR 0.9    Cardiac Enzymes: No results for input(s): CKTOTAL, CKMB, CKMBINDEX, TROPONINI in the last 168 hours. BNP (last 3 results) No results for input(s): PROBNP in the last 8760 hours. HbA1C: No results for input(s): HGBA1C in the last 72 hours. CBG: No results for input(s): GLUCAP in the last 168 hours. Lipid Profile: No results for input(s): CHOL, HDL, LDLCALC, TRIG, CHOLHDL, LDLDIRECT in  the last 72 hours. Thyroid Function Tests: No results for input(s): TSH, T4TOTAL, FREET4, T3FREE, THYROIDAB in the last 72 hours. Anemia Panel: No results for input(s): VITAMINB12, FOLATE, FERRITIN, TIBC, IRON, RETICCTPCT in the last 72 hours.  Sepsis Labs: Recent Labs  Lab 08/15/21 2358  LATICACIDVEN 0.5     Recent Results (from the past 240 hour(s))  Resp Panel by RT-PCR (Flu A&B, Covid) Nasopharyngeal Swab     Status: None   Collection Time: 08/16/21  3:51 AM   Specimen: Nasopharyngeal Swab; Nasopharyngeal(NP) swabs in vial transport medium  Result Value Ref Range Status   SARS Coronavirus 2 by RT PCR NEGATIVE NEGATIVE Final    Comment: (NOTE) SARS-CoV-2 target nucleic acids are NOT DETECTED.  The SARS-CoV-2 RNA is generally detectable in upper respiratory specimens during the acute phase of infection. The lowest concentration of SARS-CoV-2 viral copies this assay can detect is 138 copies/mL. A negative result does not preclude SARS-Cov-2 infection and should not be used as the sole basis  for treatment or other patient management decisions. A negative result may occur with  improper specimen collection/handling, submission of specimen other than nasopharyngeal swab, presence of viral mutation(s) within the areas targeted by this assay, and inadequate number of viral copies(<138 copies/mL). A negative result must be combined with clinical observations, patient history, and epidemiological information. The expected result is Negative.  Fact Sheet for Patients:  EntrepreneurPulse.com.au  Fact Sheet for Healthcare Providers:  IncredibleEmployment.be  This test is no t yet approved or cleared by the [place] FDA and  has been authorized for detection and/or diagnosis of SARS-CoV-2 by FDA under an Emergency Use Authorization (EUA). This EUA will remain  in effect (meaning this test can be used) for the duration of the COVID-19  declaration under Section 564(b)(1) of the Act, 21 U.S.C.section 360bbb-3(b)(1), unless the authorization is terminated  or revoked sooner.       Influenza A by PCR NEGATIVE NEGATIVE Final   Influenza B by PCR NEGATIVE NEGATIVE Final    Comment: (NOTE) The Xpert Xpress SARS-CoV-2/FLU/RSV plus assay is intended as an aid in the diagnosis of influenza from Nasopharyngeal swab specimens and should not be used as a sole basis for treatment. Nasal washings and aspirates are unacceptable for Xpert Xpress SARS-CoV-2/FLU/RSV testing.  Fact Sheet for Patients: EntrepreneurPulse.com.au  Fact Sheet for Healthcare Providers: IncredibleEmployment.be  This test is not yet approved or cleared by the [place] FDA and has been authorized for detection and/or diagnosis of SARS-CoV-2 by FDA under an Emergency Use Authorization (EUA). This EUA will remain in effect (meaning this test can be used) for the duration of the COVID-19 declaration under Section 564(b)(1) of the Act, 21 U.S.C. section 360bbb-3(b)(1), unless the authorization is terminated or revoked.  Performed at [facility], [place] [address] [place], [place] [address]   Culture, blood (routine x 2)     Status: None (Preliminary result)   Collection Time: 08/16/21  7:15 AM   Specimen: BLOOD  Result Value Ref Range Status   Specimen Description   Final    BLOOD LEFT ANTECUBITAL Performed at [place] [address] [place], [place] [address]    Special Requests   Final    BOTTLES DRAWN AEROBIC AND ANAEROBIC Blood Culture results may not be optimal due to an inadequate volume of blood received in culture bottles Performed at [place] [address], [place], [place] [address]    Culture   Final    NO GROWTH 3 DAYS Performed at [facility], [place] [address] [place], [place] [address]    Report Status PENDING  Incomplete   Culture, blood (routine x 2)     Status: None (Preliminary result)   Collection Time: 08/16/21  7:40 AM   Specimen: BLOOD  Result Value Ref Range Status   Specimen Description   Final    BLOOD LEFT ANTECUBITAL Performed at [facility] [address] [place], [place] [address]    Special Requests   Final    BOTTLES DRAWN AEROBIC AND ANAEROBIC Blood Culture results may not be optimal due to an inadequate volume of blood received in culture bottles Performed at [facility] [place] [address] [place],  [address]    Culture   Final    NO GROWTH 3 DAYS Performed at [facility], [place] [address], [place],  [address]    Report Status PENDING  Incomplete  Radiology Studies: DG Chest 2 View  Result Date: 08/18/2021 CLINICAL DATA:  Shortness of breath, abdominal pain EXAM: CHEST - 2 VIEW COMPARISON:  Radiograph 08/16/2021 FINDINGS: The unchanged cardiomediastinal silhouette. There are new small bilateral pleural effusions with adjacent basilar opacities. No visible pneumothorax. No acute osseous abnormality. IMPRESSION: New small bilateral pleural effusions with adjacent bibasilar opacities favored to be atelectasis. Electronically Signed   By: Maurine Simmering M.D.   On: 08/18/2021 09:30        Scheduled Meds:  sodium chloride   Intravenous Once   ALPRAZolam  1 mg Oral QHS   amLODipine  10 mg Oral Daily   atorvastatin  80 mg Oral QHS   azithromycin  250 mg Oral Once   budesonide (PULMICORT) nebulizer solution  0.25 mg Nebulization BID   calcium acetate  2,001 mg Oral TID with meals   ezetimibe  10 mg Oral Daily   ipratropium-albuterol  3 mL Nebulization TID   isosorbide mononitrate  60 mg Oral Daily   labetalol  100 mg Oral BID   mouth rinse  15 mL Mouth Rinse BID   pantoprazole  40 mg Oral Q0600   venlafaxine XR  300 mg Oral Q breakfast   Continuous Infusions:  cefTRIAXone (ROCEPHIN)  IV 1 g (08/19/21 2153)           Aline August, MD [facility] 08/20/2021, 7:37 AM

## 2021-08-21 ENCOUNTER — Encounter (HOSPITAL_COMMUNITY): Payer: Self-pay | Admitting: Internal Medicine

## 2021-08-21 ENCOUNTER — Inpatient Hospital Stay (HOSPITAL_COMMUNITY): Payer: Medicare Other

## 2021-08-21 DIAGNOSIS — K922 Gastrointestinal hemorrhage, unspecified: Secondary | ICD-10-CM | POA: Diagnosis not present

## 2021-08-21 DIAGNOSIS — N179 Acute kidney failure, unspecified: Secondary | ICD-10-CM | POA: Diagnosis not present

## 2021-08-21 DIAGNOSIS — J9601 Acute respiratory failure with hypoxia: Secondary | ICD-10-CM | POA: Diagnosis not present

## 2021-08-21 DIAGNOSIS — J189 Pneumonia, unspecified organism: Secondary | ICD-10-CM | POA: Diagnosis not present

## 2021-08-21 HISTORY — PX: IR US GUIDE VASC ACCESS RIGHT: IMG2390

## 2021-08-21 HISTORY — PX: IR FLUORO GUIDE CV LINE RIGHT: IMG2283

## 2021-08-21 LAB — BASIC METABOLIC PANEL
Anion gap: 9 (ref 5–15)
BUN: 37 mg/dL — ABNORMAL HIGH (ref 6–20)
CO2: 20 mmol/L — ABNORMAL LOW (ref 22–32)
Calcium: 8.1 mg/dL — ABNORMAL LOW (ref 8.9–10.3)
Chloride: 107 mmol/L (ref 98–111)
Creatinine, Ser: 5.66 mg/dL — ABNORMAL HIGH (ref 0.44–1.00)
GFR, Estimated: 8 mL/min — ABNORMAL LOW (ref >60)
Glucose, Bld: 77 mg/dL (ref 70–99)
Potassium: 3.9 mmol/L (ref 3.5–5.1)
Sodium: 136 mmol/L (ref 135–145)

## 2021-08-21 LAB — CBC
HCT: 26.7 % — ABNORMAL LOW (ref 36.0–46.0)
Hemoglobin: 9 g/dL — ABNORMAL LOW (ref 12.0–15.0)
MCH: 30.5 pg (ref 26.0–34.0)
MCHC: 33.7 g/dL (ref 30.0–36.0)
MCV: 90.5 fL (ref 80.0–100.0)
Platelets: 381 10*3/uL (ref 150–400)
RBC: 2.95 MIL/uL — ABNORMAL LOW (ref 3.87–5.11)
RDW: 16.6 % — ABNORMAL HIGH (ref 11.5–15.5)
WBC: 7.5 10*3/uL (ref 4.0–10.5)
nRBC: 0 % (ref 0.0–0.2)

## 2021-08-21 LAB — CULTURE, BLOOD (ROUTINE X 2)
Culture: NO GROWTH
Culture: NO GROWTH

## 2021-08-21 MED ORDER — FENTANYL CITRATE (PF) 100 MCG/2ML IJ SOLN
INTRAMUSCULAR | Status: AC
  Filled 2021-08-21: qty 2

## 2021-08-21 MED ORDER — CEFAZOLIN SODIUM-DEXTROSE 2-4 GM/100ML-% IV SOLN
INTRAVENOUS | Status: AC
  Filled 2021-08-21: qty 100

## 2021-08-21 MED ORDER — DARBEPOETIN ALFA 25 MCG/0.42ML IJ SOSY
25.0000 ug | PREFILLED_SYRINGE | INTRAMUSCULAR | Status: DC
  Administered 2021-08-21: 25 ug via INTRAVENOUS
  Filled 2021-08-21 (×2): qty 0.42

## 2021-08-21 MED ORDER — HEPARIN SODIUM (PORCINE) 1000 UNIT/ML IJ SOLN
INTRAMUSCULAR | Status: AC
  Administered 2021-08-21: 3.2 mL
  Filled 2021-08-21: qty 1

## 2021-08-21 MED ORDER — MIDAZOLAM HCL 2 MG/2ML IJ SOLN
INTRAMUSCULAR | Status: AC | PRN
  Administered 2021-08-21: 1 mg via INTRAVENOUS

## 2021-08-21 MED ORDER — MIDAZOLAM HCL 2 MG/2ML IJ SOLN
INTRAMUSCULAR | Status: AC
  Filled 2021-08-21: qty 2

## 2021-08-21 MED ORDER — CEFAZOLIN SODIUM-DEXTROSE 2-4 GM/100ML-% IV SOLN
INTRAVENOUS | Status: AC | PRN
  Administered 2021-08-21: 2 g via INTRAVENOUS

## 2021-08-21 MED ORDER — OXYCODONE-ACETAMINOPHEN 5-325 MG PO TABS
1.0000 | ORAL_TABLET | Freq: Four times a day (QID) | ORAL | Status: DC | PRN
  Administered 2021-08-21 (×2): 1 via ORAL
  Filled 2021-08-21 (×2): qty 1

## 2021-08-21 MED ORDER — FENTANYL CITRATE (PF) 100 MCG/2ML IJ SOLN
INTRAMUSCULAR | Status: AC | PRN
  Administered 2021-08-21: 25 ug via INTRAVENOUS

## 2021-08-21 MED ORDER — LIDOCAINE HCL 1 % IJ SOLN
INTRAMUSCULAR | Status: AC
  Administered 2021-08-21: 5 mL
  Filled 2021-08-21: qty 20

## 2021-08-21 NOTE — Progress Notes (Addendum)
Requested to see pt regarding out-pt HD needs. Pt was accepted at [name] prior to admission. Spoke to [place], [employer] manager, regarding pt's case. Pt has been accepted and given the schedule of MWF. Pt will need to arrive at 11:15 for 11:35 chair time. For first appt, pt will need to arrive at 10:40 to complete paperwork prior to treatment. Pt's out-pt schedule for this week due to holiday would be MWSat. Pt could start Saturday if needed but would need to complete paperwork on Friday. Requested clinicals faxed to clinic today regarding access and pt's first treatment today. Will advise clinic of start date once d/c date is known. Will follow and assist.   Melven Sartorius Renal Navigator [phone]   Addendum at 26: 28 am: Met with pt at bedside to discuss out-pt HD arrangements. Pt provided information sheet with schedule and clinic noted. Info also added to AVS. Pt confirms she will have transportation to/from HD.

## 2021-08-21 NOTE — Progress Notes (Signed)
Chief Complaint: Patient was seen in consultation today for HD catheter placement  at the request of [place]  Referring Physician(s): [place]  Supervising Physician: Mir, Sharen Heck  Patient Status: [facility] - In-pt  History of Present Illness: [name] is a 60 y.o. female with multiple medical issues including progressive renal failure. She is now in need of HD. IR is asked to place tunneled HD catheter. PMHx, meds, labs, imaging, allergies reviewed. Feels well, no recent fevers, chills, illness. Has been NPO today as directed.   Past Medical History:  Diagnosis Date   Anemia 2008   Anginal pain ([place])    Anxiety    Aortic stenosis    abominal aorta, s/p distal aortic stent 09/28/18, 12/01/20   Arthritis    "all my joints ache" (09/07/2014)   Asthma    Bipolar 1 disorder ([place])    Blood transfusion without reported diagnosis    Bradycardia    Bruit    Carpal tunnel syndrome    Cataract    forming   Cervical cancer ([place]) 1985   CHF (congestive heart failure) ([place])    Chronic kidney disease (CKD), stage V (HCC)    COPD (chronic obstructive pulmonary disease) ([place]) 2000   Coronary artery disease    Daily headache    Depression 2000   Diverticulitis 2008   Emphysema of lung (HCC)    GERD (gastroesophageal reflux disease)    Glaucoma    Heart murmur    History of colon polyps 2009   HLD (hyperlipidemia) 2013   Hypertension 2013   Hypovitaminosis D    IBS (irritable bowel syndrome) 2008   Myocardial infarction [facility])    2015   PAD (peripheral artery disease) ([place])    Pancreatitis 10/2011   Pneumonia 07/2014   RLS (restless legs syndrome)    Schizophrenia (HCC)    Shortness of breath    Skin cancer    Small bowel obstruction ([place]) 2008   Thyroid disease     Past Surgical History:  Procedure Laterality Date   ABDOMINAL ANGIOGRAM N/A 09/07/2014   Procedure: ABDOMINAL ANGIOGRAM;  Surgeon: Laverda Page, MD;  Location: [facility] CATH LAB;   Service: Cardiovascular;  Laterality: N/A;   ABDOMINAL AORTOGRAM W/LOWER EXTREMITY N/A 12/28/2020   Procedure: ABDOMINAL AORTOGRAM W/LOWER EXTREMITY;  Surgeon: Cherre Robins, MD;  Location: [place] CV LAB;  Service: Cardiovascular;  Laterality: N/A;   APPENDECTOMY  10/01/1994   BIOPSY  08/06/2020   Procedure: BIOPSY;  Surgeon: Jackquline Denmark, MD;  Location: [facility] ENDOSCOPY;  Service: Endoscopy;;   COLON SURGERY  10/01/2006   6 inches of colon removed due to obstruction   COLONOSCOPY WITH PROPOFOL N/A 10/25/2016   Procedure: COLONOSCOPY WITH PROPOFOL;  Surgeon: Milus Banister, MD;  Location: [facility] ENDOSCOPY;  Service: Endoscopy;  Laterality: N/A;   COLONOSCOPY WITH PROPOFOL N/A 08/07/2020   Procedure: COLONOSCOPY WITH PROPOFOL;  Surgeon: Jackquline Denmark, MD;  Location: [facility] ENDOSCOPY;  Service: Endoscopy;  Laterality: N/A;   COLONOSCOPY WITH PROPOFOL N/A 08/27/2020   Procedure: COLONOSCOPY WITH PROPOFOL;  Surgeon: Lavena Bullion, DO;  Location: [facility]OSCOPY;  Service: Gastroenterology;  Laterality: N/A;   CORONARY ANGIOPLASTY WITH STENT PLACEMENT  09/07/2014   "2"   ENTEROSCOPY N/A 08/27/2020   Procedure: ENTEROSCOPY;  Surgeon: Lavena Bullion, DO;  Location: [facility]OS[facility];  Service: Gastroenterology;  Laterality: N/A;  Push enteroscopy    ENTEROSCOPY N/A 06/12/2021   Procedure: ENTEROSCOPY;  Surgeon: Rush Landmark Telford Nab., MD;  Location: [facility];  Service: Gastroenterology;  Laterality: N/A;   ESOPHAGOGASTRODUODENOSCOPY (EGD) WITH PROPOFOL N/A 08/06/2020   Procedure: ESOPHAGOGASTRODUODENOSCOPY (EGD) WITH PROPOFOL;  Surgeon: Jackquline Denmark, MD;  [facility]: [facility];  Service: Endoscopy;  Laterality: N/A;   GIVENS CAPSULE STUDY N/A 08/24/2020   Procedure: GIVENS CAPSULE STUDY;  Surgeon: Lavena Bullion, DO;  [facility]: [facility];  Service: Gastroenterology;  Laterality: N/A;   HOT HEMOSTASIS N/A 08/07/2020   Procedure: HOT HEMOSTASIS (ARGON PLASMA COAGULATION/BICAP);  Surgeon:  Jackquline Denmark, MD;  Location: [facility];  Service: Endoscopy;  Laterality: N/A;   HOT HEMOSTASIS N/A 08/27/2020   Procedure: HOT HEMOSTASIS (ARGON PLASMA COAGULATION/BICAP);  Surgeon: Lavena Bullion, DO;  [facility];  Service: Gastroenterology;  Laterality: N/A;   LEFT HEART CATH AND CORONARY ANGIOGRAPHY N/A 03/25/2018   Procedure: LEFT HEART CATH AND CORONARY ANGIOGRAPHY;  Surgeon: Nigel Mormon, MD;  Location: [place] CV LAB;  Service: Cardiovascular;  Laterality: N/A;   LEFT HEART CATH AND CORONARY ANGIOGRAPHY N/A 05/09/2021   Procedure: LEFT HEART CATH AND CORONARY ANGIOGRAPHY;  Surgeon: Adrian Prows, MD;  Location: [place] CV LAB;  Service: Cardiovascular;  Laterality: N/A;   LEFT HEART CATHETERIZATION WITH CORONARY ANGIOGRAM N/A 09/07/2014   Procedure: LEFT HEART CATHETERIZATION WITH CORONARY ANGIOGRAM;  Surgeon: Laverda Page, MD;  Location: [facility] CATH LAB;  Service: Cardiovascular;  Laterality: N/A;   LOWER EXTREMITY ANGIOGRAPHY  10/29/2017   Procedure: Lower Extremity Angiography;  Surgeon: Adrian Prows, MD;  Location: [place] CV LAB;  Service: Cardiovascular;;   PERIPHERAL VASCULAR CATHETERIZATION N/A 11/01/2015   Procedure: Renal Angiography;  Surgeon: Adrian Prows, MD;  Location: [place] CV LAB;  Service: Cardiovascular;  Laterality: N/A;   PERIPHERAL VASCULAR CATHETERIZATION N/A 04/10/2016   Procedure: Renal Angiography;  Surgeon: Adrian Prows, MD;  Location: [place] CV LAB;  Service: Cardiovascular;  Laterality: N/A;   PERIPHERAL VASCULAR CATHETERIZATION  04/10/2016   Procedure: Peripheral Vascular Intervention;  Surgeon: Adrian Prows, MD;  Location: [place] CV LAB;  Service: Cardiovascular;;   PERIPHERAL VASCULAR INTERVENTION  10/29/2017   Procedure: PERIPHERAL VASCULAR INTERVENTION;  Surgeon: Adrian Prows, MD;  Location: [place] CV LAB;  Service: Cardiovascular;;   PERIPHERAL VASCULAR INTERVENTION Bilateral 12/28/2020   Procedure: PERIPHERAL  VASCULAR INTERVENTION;  Surgeon: Cherre Robins, MD;  Location: [place] CV LAB;  Service: Cardiovascular;  Laterality: Bilateral;   POLYPECTOMY  08/07/2020   Procedure: POLYPECTOMY;  Surgeon: Jackquline Denmark, MD;  [facility];  Service: Endoscopy;;   POLYPECTOMY  08/27/2020   Procedure: POLYPECTOMY;  Surgeon: Lavena Bullion, DO;  [facility]: [facility];  Service: Gastroenterology;;   POLYPECTOMY     RENAL ANGIOGRAPHY N/A 10/29/2017   Procedure: RENAL ANGIOGRAPHY;  Surgeon: Adrian Prows, MD;  Location: [place] CV LAB;  Service: Cardiovascular;  Laterality: N/A;   RENAL ANGIOGRAPHY N/A 05/10/2020   Procedure: RENAL ANGIOGRAPHY;  Surgeon: Nigel Mormon, MD;  Location: [place] CV LAB;  Service: Cardiovascular;  Laterality: N/A;   RIGHT OOPHORECTOMY Right 10/01/1994   SUBMUCOSAL TATTOO INJECTION  06/12/2021   Procedure: SUBMUCOSAL TATTOO INJECTION;  Surgeon: Irving Copas., MD;  [facility]: [facility];  Service: Gastroenterology;;   TOTAL ABDOMINAL HYSTERECTOMY  10/02/1995   UPPER GASTROINTESTINAL ENDOSCOPY      Allergies: Doxycycline, Hydralazine, Aspirin, Hydrocodone, Ibuprofen, Tylenol [acetaminophen], and Iohexol  Medications: Prior to Admission medications   Medication Sig Start Date End Date Taking? Authorizing Provider  ALPRAZolam Duanne Moron) 1 MG tablet Take 0.5-1 tablets by mouth See admin instructions. Take  1 mg by mouth at bedtime and an additional 0.5-1 mg once a day as needed for anxiety 04/15/17  Yes [provider]  amLODipine (NORVASC) 10 MG tablet TAKE 1 TABLET BY MOUTH ONCE DAILY Patient taking differently: Take 10 mg by mouth daily. 04/13/21  Yes Cantwell, Celeste C, PA-C  atorvastatin (LIPITOR) 80 MG tablet Take 80 mg by mouth at bedtime.   Yes [provider]  calcium acetate (PHOSLO) 667 MG capsule Take 2,001 mg by mouth See admin instructions. Take 2001mg  (3 capsules) by mouth three times a day with meals and 667mg  (1  capsule) with a snack   Yes [provider]  ezetimibe (ZETIA) 10 MG tablet TAKE 1 TABLET BY MOUTH ONCE A DAY Patient taking differently: Take 10 mg by mouth daily. 05/03/21  Yes Cantwell, Celeste C, PA-C  isosorbide mononitrate (IMDUR) 60 MG 24 hr tablet Take 1 tablet (60 mg total) by mouth daily. 06/14/21 09/12/21 Yes Dahal, Marlowe Aschoff, MD  labetalol (NORMODYNE) 100 MG tablet Take 100 mg by mouth 2 (two) times daily.   Yes [provider]  LINZESS 72 MCG capsule TAKE 1 CAPSULE BY MOUTH ONCE DAILY BEFORE BREAKFAST Patient taking differently: Take 72 mcg by mouth daily before breakfast. 08/03/21  Yes Levin Erp, PA  megestrol (MEGACE) 40 MG tablet Take 40 mg by mouth daily.   Yes [provider]  mometasone-formoterol (DULERA) 100-5 MCG/ACT AERO Take 2 puffs first thing in am and then another 2 puffs about 12 hours later. Patient taking differently: Inhale 2 puffs into the lungs every 12 (twelve) hours. 04/26/17  Yes Tanda Rockers, MD  nitroGLYCERIN (NITROSTAT) 0.4 MG SL tablet Place 1 tablet (0.4 mg total) under the tongue every 5 (five) minutes as needed for chest pain. 10/15/19  Yes Miquel Dunn, NP  ondansetron (ZOFRAN-ODT) 4 MG disintegrating tablet Take 1 tablet (4 mg total) by mouth every 8 (eight) hours as needed for nausea or vomiting. 07/25/21  Yes Hayden Rasmussen, MD  oxyCODONE (OXY IR/ROXICODONE) 5 MG immediate release tablet Take 5 mg by mouth in the morning and at bedtime.   Yes [provider]  pantoprazole (PROTONIX) 40 MG tablet TAKE 1 TABLET BY MOUTH TWICE (2) DAILY Patient taking differently: Take 40 mg by mouth 2 (two) times daily. 06/08/20  Yes Adrian Prows, MD  promethazine (PHENERGAN) 25 MG tablet Take 25 mg by mouth 2 (two) times daily as needed for nausea or vomiting. 08/16/20  Yes [provider]  venlafaxine XR (EFFEXOR XR) 150 MG 24 hr capsule Take 2 capsules (300 mg total) by mouth daily with breakfast. 05/12/21  Yes  Arrien, Jimmy Picket, MD  albuterol (PROVENTIL HFA;VENTOLIN HFA) 108 (90 BASE) MCG/ACT inhaler Inhale 2 puffs into the lungs every 6 (six) hours as needed for wheezing or shortness of breath (asthma). Patient not taking: Reported on 08/16/2021    [provider]  azelastine (ASTELIN) 0.1 % nasal spray Place 1 spray into both nostrils daily as needed (seasonal allergies). Patient not taking: Reported on 08/16/2021 06/24/19   [provider]  calcitRIOL (ROCALTROL) 0.5 MCG capsule Take 0.5 mcg by mouth daily. Patient not taking: No sig reported    [provider]  feeding supplement (ENSURE ENLIVE / ENSURE PLUS) LIQD Take 237 mLs by mouth daily. Patient not taking: No sig reported 06/13/21   Terrilee Croak, MD     Family History  Problem Relation Age of Onset   Other Mother  many bowel obstructions   Heart disease Mother    Colon polyps Mother    Kidney cancer Father    Bone cancer Father    Diabetes Father    Heart disease Father    Heart disease Brother    Colon cancer Paternal Grandfather    Diabetes Daughter    Esophageal cancer Neg Hx    Rectal cancer Neg Hx    Stomach cancer Neg Hx     Social History   Socioeconomic History   Marital status: Divorced    Spouse name: Not on file   Number of children: 1   Years of education: Not on file   Highest education level: Not on file  Occupational History   Occupation: Scientist, research (life sciences): UNEMPLOYED  Tobacco Use   Smoking status: Some Days    Packs/day: 1.50    Years: 40.00    Pack years: 60.00    Types: Cigarettes   Smokeless tobacco: Never  Vaping Use   Vaping Use: Former   Substances: THC  Substance and Sexual Activity   Alcohol use: No   Drug use: Yes    Frequency: 3.0 times per week    Types: Marijuana    Comment: last use yesterday   Sexual activity: Yes    Birth control/protection: Post-menopausal  Other Topics Concern   Not on file  Social History Narrative    Not on file   Social Determinants of Health   Financial Resource Strain: Not on file  Food Insecurity: Not on file  Transportation Needs: Not on file  Physical Activity: Not on file  Stress: Not on file  Social Connections: Not on file     Review of Systems: A 12 point ROS discussed and pertinent positives are indicated in the HPI above.  All other systems are negative.  Review of Systems  Vital Signs: BP (!) 174/75 (BP Location: Left Arm)   Pulse 77   Temp 98.2 F (36.8 C)   Resp 16   Ht 5\' 1"  (1.549 m)   Wt 44.7 kg   SpO2 (!) 84% Comment: Found patient on room air sats 84%. Placed patient back on  2L.  BMI 18.62 kg/m   Physical Exam Constitutional:      Appearance: She is well-developed.  HENT:     Mouth/Throat:     Mouth: Mucous membranes are moist.     Pharynx: Oropharynx is clear.  Cardiovascular:     Rate and Rhythm: Normal rate and regular rhythm.     Heart sounds: Normal heart sounds.  Pulmonary:     Effort: Pulmonary effort is normal. No respiratory distress.     Breath sounds: Normal breath sounds.  Abdominal:     Palpations: Abdomen is soft.  Skin:    General: Skin is warm and dry.  Neurological:     General: No focal deficit present.     Mental Status: She is alert and oriented to person, place, and time.    Imaging: CT ABDOMEN PELVIS WO CONTRAST  Result Date: 08/16/2021 CLINICAL DATA:  Acute abdominal pain with rectal bleeding, initial encounter EXAM: CT ABDOMEN AND PELVIS WITHOUT CONTRAST TECHNIQUE: Multidetector CT imaging of the abdomen and pelvis was performed following the standard protocol without IV contrast. COMPARISON:  07/25/2021 FINDINGS: Lower chest: Mild atelectatic changes are noted in the left lung base. Hepatobiliary: No focal liver abnormality is seen. No gallstones, gallbladder wall thickening, or biliary dilatation. Pancreas: Unremarkable. No pancreatic ductal dilatation or surrounding  inflammatory changes. Spleen: Normal in  size without focal abnormality. Adrenals/Urinary Tract: Adrenal glands are within normal limits. No renal calculi or obstructive changes are seen. The bladder is partially distended. Stomach/Bowel: Mild wall thickening is noted within the colon which may be related to incomplete distension although the possibility of mild colitis could not be totally excluded given the patient's clinical history. Changes of prior appendectomy are seen. Small bowel and stomach appear within normal limits. Vascular/Lymphatic: Diffuse atherosclerotic calcifications are noted. Kissing iliac stents are seen. No significant lymphadenopathy is noted. Reproductive: Status post hysterectomy. No adnexal masses. Other: No abdominal wall hernia or abnormality. No abdominopelvic ascites. Musculoskeletal: No acute or significant osseous findings. IMPRESSION: Mild wall thickening within the colon as described. This may represent some mild colitis. No other focal abnormality is noted. Electronically Signed   By: Inez Catalina M.D.   On: 08/16/2021 01:23   CT ABDOMEN PELVIS WO CONTRAST  Result Date: 07/25/2021 CLINICAL DATA:  Abdominal pain EXAM: CT ABDOMEN AND PELVIS WITHOUT CONTRAST TECHNIQUE: Multidetector CT imaging of the abdomen and pelvis was performed following the standard protocol without IV contrast. COMPARISON:  CT abdomen and pelvis 06/10/2021 FINDINGS: Lower chest: There is of compressive atelectasis visualized in the medial aspect of the right middle lobe and lower lobe, new since previous study. Cardiomegaly. Hepatobiliary: No focal liver abnormality is seen. No gallstones, gallbladder wall thickening, or biliary dilatation. Pancreas: Unremarkable. No pancreatic ductal dilatation or surrounding inflammatory changes. Spleen: Normal in size without focal abnormality. Adrenals/Urinary Tract: Adrenal glands are within normal limits. Stable chronic left renal atrophy. No nephrolithiasis or hydronephrosis identified bilaterally.  Urinary bladder appears unremarkable. Stomach/Bowel: No bowel obstruction, free air or pneumatosis. Appears to be wall thickening of the descending and sigmoid colon, limited evaluation due to nondistention. Appendix not visualized. Vascular/Lymphatic: Severe calcified atherosclerotic disease. No bulky lymphadenopathy identified. Reproductive: Status post hysterectomy. No adnexal masses. Other: Stable small amount of free fluid in the dependent pelvis. Musculoskeletal: No acute or significant osseous findings. IMPRESSION: 1. Apparent wall thickening of the descending and sigmoid colon although limited evaluation due to nondistention. Correlate for possible colitis. 2. Chronic left renal atrophy. 3. Stable small amount of free fluid in the dependent pelvis. 4. Compressive atelectatic changes in the medial right middle and lower lobe, new since previous study. 5. Severe atherosclerotic disease noted. Electronically Signed   By: Ofilia Neas   On: 07/25/2021 12:34   DG Chest 2 View  Result Date: 08/18/2021 CLINICAL DATA:  Shortness of breath, abdominal pain EXAM: CHEST - 2 VIEW COMPARISON:  Radiograph 08/16/2021 FINDINGS: The unchanged cardiomediastinal silhouette. There are new small bilateral pleural effusions with adjacent basilar opacities. No visible pneumothorax. No acute osseous abnormality. IMPRESSION: New small bilateral pleural effusions with adjacent bibasilar opacities favored to be atelectasis. Electronically Signed   By: Maurine Simmering M.D.   On: 08/18/2021 09:30   DG Chest Port 1 View  Result Date: 08/16/2021 CLINICAL DATA:  Chest pain. EXAM: PORTABLE CHEST 1 VIEW COMPARISON:  August 15, 2021. FINDINGS: Stable cardiomegaly. No pneumothorax or pleural effusion is noted. Left lung is clear. Stable right infrahilar opacity is noted concerning for atelectasis or infiltrate. Bony thorax is unremarkable. IMPRESSION: Stable right infrahilar opacity is noted concerning for atelectasis or possibly  infiltrate. Electronically Signed   By: Marijo Conception M.D.   On: 08/16/2021 16:12   DG Chest Portable 1 View  Result Date: 08/15/2021 CLINICAL DATA:  Cough and fever EXAM: PORTABLE CHEST 1 VIEW COMPARISON:  07/25/2021 FINDINGS: Increased opacity in the parahilar right lung. No pleural effusion or pneumothorax. Lungs are hyperinflated. IMPRESSION: Increased opacity in the parahilar right lung, possibly infection. Electronically Signed   By: Ulyses Jarred M.D.   On: 08/15/2021 23:44   DG Chest Port 1 View  Result Date: 07/25/2021 CLINICAL DATA:  Chest pain EXAM: PORTABLE CHEST 1 VIEW COMPARISON:  05/08/2021 FINDINGS: Stable chronic hyperinflation. Heart and mediastinal contours are within normal limits. No focal opacities or effusions. No acute bony abnormality. Old right clavicle fracture. IMPRESSION: No active cardiopulmonary disease. Electronically Signed   By: Rolm Baptise M.D.   On: 07/25/2021 20:48   VAS [place] UPPER EXTREMITY ARTERIAL DUPLEX  Result Date: 08/07/2021  UPPER EXTREMITY DUPLEX STUDY Patient Name:  [name]  Date of Exam:   08/07/2021 Medical Rec #: [id number]         Accession #:    [id number] Date of Birth: March 31, 1961         Patient Gender: F Patient Age:   53 years Exam Location:  [facility] Procedure:      VAS [place] UPPER EXTREMITY ARTERIAL DUPLEX Referring Phys: Jamelle Haring --------------------------------------------------------------------------------  Indications: New access. History:     Patient has a history of CKD 4.  Performing Technologist: Ronal Fear RVS, RCS  Examination Guidelines: A complete evaluation includes B-mode imaging, spectral Doppler, color Doppler, and power Doppler as needed of all accessible portions of each vessel. Bilateral testing is considered an integral part of a complete examination. Limited examinations for reoccurring indications may be performed as noted.  Right Pre-Dialysis Findings:  +-----------------------+----------+--------------------+---------+--------+ Location               PSV (cm/s)Intralum. Diam. (cm)Waveform Comments +-----------------------+----------+--------------------+---------+--------+ Brachial Antecub. fossa103       0.51                triphasic         +-----------------------+----------+--------------------+---------+--------+ Radial Art at Wrist    84        0.24                triphasic         +-----------------------+----------+--------------------+---------+--------+ Ulnar Art at Wrist     87        0.34                triphasic         +-----------------------+----------+--------------------+---------+--------+  Left Pre-Dialysis Findings: +------------------+----------+-------------------+---------+------------------+ Location          PSV (cm/s)Intralum. Diam.    Waveform Comments                                       (cm)                                           +------------------+----------+-------------------+---------+------------------+ Brachial Antecub. 120       0.47               triphasic                   fossa                                                                      +------------------+----------+-------------------+---------+------------------+  Radial Art at     99        0.30               triphasictrotuous in upper  Wrist                                                   forearm            +------------------+----------+-------------------+---------+------------------+ Ulnar Art at Wrist85        0.27               triphasic                   +------------------+----------+-------------------+---------+------------------+  Summary:  Right: No obstruction visualized in the right upper extremity. Left: No obstruction visualized in the left upper extremity. *See table(s) above for measurements and observations. Electronically signed by Harold Barban MD on 08/07/2021 at 6:14:39  PM.    Final    VAS [place] UPPER EXTREMITY VEIN MAPPING  Result Date: 08/07/2021 UPPER EXTREMITY VEIN MAPPING Patient Name:  [name]  Date of Exam:   08/07/2021 Medical Rec #: [id number]         Accession #:    [id number] Date of Birth: Jan 14, 1961         Patient Gender: F Patient Age:   67 years Exam Location:  [facility] Procedure:      VAS [place] UPPER EXT VEIN MAPPING (PRE-OP AVF) Referring Phys: Harold Barban --------------------------------------------------------------------------------  Indications: Pre-access. History: CKD 4.  Performing Technologist: Ronal Fear RVS, RCS  Examination Guidelines: A complete evaluation includes B-mode imaging, spectral Doppler, color Doppler, and power Doppler as needed of all accessible portions of each vessel. Bilateral testing is considered an integral part of a complete examination. Limited examinations for reoccurring indications may be performed as noted. +-----------------+-------------+----------+--------+ Right Cephalic   Diameter (cm)Depth (cm)Findings +-----------------+-------------+----------+--------+ Shoulder             0.24                        +-----------------+-------------+----------+--------+ Prox upper arm       0.23                        +-----------------+-------------+----------+--------+ Mid upper arm        0.23                        +-----------------+-------------+----------+--------+ Dist upper arm       0.29                        +-----------------+-------------+----------+--------+ Antecubital fossa    0.37                        +-----------------+-------------+----------+--------+ Prox forearm         0.11                        +-----------------+-------------+----------+--------+ Mid forearm          0.16                        +-----------------+-------------+----------+--------+ Dist forearm  0.25                         +-----------------+-------------+----------+--------+ +-----------------+-------------+----------+--------+ Right Basilic    Diameter (cm)Depth (cm)Findings +-----------------+-------------+----------+--------+ Prox upper arm       0.24                        +-----------------+-------------+----------+--------+ Mid upper arm        0.37                        +-----------------+-------------+----------+--------+ Dist upper arm       0.40                        +-----------------+-------------+----------+--------+ Antecubital fossa    0.42                        +-----------------+-------------+----------+--------+ Prox forearm         0.35                        +-----------------+-------------+----------+--------+ +-----------------+-------------+----------+--------------+ Left Cephalic    Diameter (cm)Depth (cm)   Findings    +-----------------+-------------+----------+--------------+ Shoulder             0.13                              +-----------------+-------------+----------+--------------+ Prox upper arm       0.16                              +-----------------+-------------+----------+--------------+ Mid upper arm        0.11                              +-----------------+-------------+----------+--------------+ Dist upper arm       0.16                              +-----------------+-------------+----------+--------------+ Antecubital fossa    0.11                              +-----------------+-------------+----------+--------------+ Prox forearm                            not visualized +-----------------+-------------+----------+--------------+ Mid forearm          0.06                              +-----------------+-------------+----------+--------------+ Dist forearm                            not visualized +-----------------+-------------+----------+--------------+ +-----------------+-------------+----------+--------+  Left Basilic     Diameter (cm)Depth (cm)Findings +-----------------+-------------+----------+--------+ Prox upper arm       0.22                        +-----------------+-------------+----------+--------+ Mid upper arm        0.23                        +-----------------+-------------+----------+--------+  Dist upper arm       0.25                        +-----------------+-------------+----------+--------+ Antecubital fossa    0.23                        +-----------------+-------------+----------+--------+ Prox forearm         0.16                        +-----------------+-------------+----------+--------+ Summary: Right: Patent and compressible cephalic and basilic veins. Left: Patent and compressible cephalic and basilic veins. *See table(s) above for measurements and observations.  Diagnosing physician: Harold Barban MD Electronically signed by Harold Barban MD on 08/07/2021 at 6:15:45 PM.    Final     Labs:  CBC: Recent Labs    08/18/21 0540 08/18/21 1600 08/19/21 0307 08/20/21 0414 08/21/21 0259  WBC 7.8  --  8.0 7.8 7.5  HGB 7.1* 9.3* 8.5* 8.9* 9.0*  HCT 21.3* 27.5* 24.6* 26.5* 26.7*  PLT 318  --  334 388 381    COAGS: Recent Labs    08/23/20 1048 08/24/20 0407 06/10/21 1341 08/15/21 2358  INR 0.9 1.0 0.9 0.9    BMP: Recent Labs    08/18/21 0540 08/19/21 0307 08/20/21 0414 08/21/21 0259  NA 132* 134* 135 136  K 3.7 3.9 4.1 3.9  CL 103 104 105 107  CO2 21* 21* 19* 20*  GLUCOSE 70 83 79 77  BUN 48* 47* 43* 37*  CALCIUM 7.6* 7.8* 7.8* 8.1*  CREATININE 5.69* 5.94* 5.86* 5.66*  GFRNONAA 8* 8* 8* 8*    LIVER FUNCTION TESTS: Recent Labs    05/08/21 2332 06/10/21 1341 06/10/21 1608 07/25/21 1116  BILITOT 0.2* 0.5 0.5 0.3  AST 21 21 21 15   ALT 13 15 14 11   ALKPHOS 143* 126 136* 175*  PROT 6.9 5.7* 6.3* 6.6  ALBUMIN 3.5 2.6* 2.9* 2.6*    TUMOR MARKERS: No results for input(s): AFPTM, CEA, CA199, CHROMGRNA in the last 8760  hours.  Assessment and Plan: ESRD For tunneled HD cath Risks and benefits of image guided tunneled hemodialysis catheter placement was discussed with the patient including, but not limited to bleeding, infection, pneumothorax, or fibrin sheath development and need for additional procedures.  All of the patient's questions were answered, patient is agreeable to proceed. Consent signed and in chart.   Thank you for this interesting consult.  I greatly enjoyed meeting [name] and look forward to participating in their care.  A copy of this report was sent to the requesting provider on this date.  Electronically Signed: Ascencion Dike, PA-C 08/21/2021, 8:30 AM   I spent a total of 20 minutes in face to face in clinical consultation, greater than 50% of which was counseling/coordinating care for HD cath

## 2021-08-21 NOTE — Progress Notes (Signed)
Patient ID: Cheyenne RHUDY, female   DOB: April 29, 1961, 60 y.o.   MRN: [id number]  PROGRESS NOTE    [name]  CHE:[id number] DOB: 07-03-1961 DOA: 08/15/2021 PCP: Bernerd Limbo, MD   Brief Narrative:  60 y.o. female with medical history significant of hypertension, hyperlipidemia, chronic diastolic CHF, CAD, aortic stenosis status post stent, COPD, tobacco use, PAD status post aortic stent/SMA stent/bilateral common iliac artery stent, CKD stage V, chronic pain, anxiety, depression, chronic mesenteric ischemia, history of GI bleed with recent admission in September 2022, chronic blood loss anemia presented with hematochezia and melena.  She was admitted for acute respiratory failure with hypoxia of with possible pneumonia and started on antibiotics along with GI bleeding with imaging suspicious for colitis for which Flagyl was also added.  GI was consulted.  EGD was unremarkable ; she had capsule endoscopy on 08/18/2021.  She had worsening AKI for which nephrology recommended transfer to [facility] for possible need for dialysis.  Assessment & Plan:   Acute on chronic blood loss anemia Probable acute GI bleeding Possible colitis -status post unremarkable EGD on 08/18/2021; capsule endoscopy showed no evidence of active bleeding nor blood in the small bowel; colonic erosion versus polyp, but not bleeding; no blood seen in the colon. -Status post 1 unit packed red cell transfusion on 08/18/2021 for hemoglobin of 7.1.  Hemoglobin 9 today.  Monitor.  Continue daily Protonix for now. -GI signed off on 08/19/2021: Flagyl discontinued on 08/19/2021 per GI recommendations as GI thinks that patient does not have colitis.  Outpatient follow-up with GI  Acute respiratory failure with hypoxia Possible community-acquired bacterial pneumonia -Currently on 2 L oxygen via nasal cannula.  Wean off as able.  Completed 5-day course of Rocephin and Zithromax.  Blood cultures negative so far.  COVID  19/influenza PCR negative on presentation.  AKI on CKD stage IV -Nephrology following.  Transferred to [facility] for possible dialysis catheter placement and starting HD. S/P HD catheter placement by IR today. -Creatinine 5.66 today  HTN -Continue amlodipine, isosorbide mononitrate and labetalol  Chronic diastolic CHF -Currently looks euvolemic.  Strict input and output.  Daily weights.  Hyperlipidemia -Continue statin and Zetia  Anxiety and depression -Continue venlafaxine   DVT prophylaxis: SCDs Code Status: Full Family Communication: Grandson at bedside Disposition Plan: Status is: Inpatient  Remains inpatient appropriate because: Of need for starting dialysis  Consultants: Nephrology/GI/IR  Procedures:  EGD on 08/18/2021.  Capsule endoscopy on 08/18/2021 HD catheter placement by IR today.  Antimicrobials:  Rocephin and Zithromax from 08/15/2021-08/20/2021  Subjective: Patient seen and examined at bedside.  Still complains of intermittent flank pain and asking for more pain meds.  No worsening shortness of breath, vomiting reported.   Objective: Vitals:   08/20/21 1918 08/20/21 2117 08/21/21 0500 08/21/21 0537  BP:  (!) 169/80  (!) 174/75  Pulse:  81  77  Resp:  20  18  Temp:  98.1 F (36.7 C)  98.2 F (36.8 C)  TempSrc:      SpO2: 94% 92%  (!) 86%  Weight:   44.7 kg   Height:        Intake/Output Summary (Last 24 hours) at 08/21/2021 0719 Last data filed at 08/21/2021 0600 Gross per 24 hour  Intake 500 ml  Output 500 ml  Net 0 ml    Filed Weights   08/18/21 1216 08/19/21 0411 08/21/21 0500  Weight: 43.1 kg 43 kg 44.7 kg    Examination:  General  exam: No acute distress.  Currently on 2 L oxygen via nasal cannula looks chronically ill.   Respiratory system: Decreased breath sounds at bases bilaterally cardiovascular system: Rate controlled; S1-S2 heard gastrointestinal system: Abdomen is distended slightly; soft and nontender.  Normal  bowel sounds heard. extremities: Mild lower extremity edema present; no cyanosis  Central nervous system: Alert and oriented.  No focal neurological deficits.  Moves extremities  skin: No obvious ecchymosis/rashes psychiatry: Flat affect  Data Reviewed: I have personally reviewed following labs and imaging studies  CBC: Recent Labs  Lab 08/15/21 1108 08/15/21 1551 08/15/21 2358 08/17/21 0453 08/18/21 0540 08/18/21 1600 08/20/21 0414 08/21/21 0259  WBC 8.7 8.6  --  11.3* 7.8  --  7.8 7.5  NEUTROABS 7.0 6.8  --   --   --   --   --   --   HGB 9.4* 9.4*   < > 8.1* 7.1* 9.3* 8.9* 9.0*  HCT 27.2* 27.4*   < > 23.7* 21.3* 27.5* 26.5* 26.7*  MCV 89.7 89.3  --  89.1 91.4  --  91.4 90.5  PLT 509.0* 433*  --  360 318  --  388 381   < > = values in this interval not displayed.    Basic Metabolic Panel: Recent Labs  Lab 08/16/21 0740 08/17/21 0453 08/18/21 0540 08/20/21 0414 08/21/21 0259  NA 132* 131* 132* 135 136  K 3.5 3.6 3.7 4.1 3.9  CL 99 102 103 105 107  CO2 20* 19* 21* 19* 20*  GLUCOSE 88 94 70 79 77  BUN 44* 44* 48* 43* 37*  CREATININE 5.43* 5.25* 5.69* 5.86* 5.66*  CALCIUM 8.3* 7.9* 7.6* 7.8* 8.1*    GFR: Estimated Creatinine Clearance: 7.6 mL/min (A) (by C-G formula based on SCr of 5.66 mg/dL (H)). Liver Function Tests: No results for input(s): AST, ALT, ALKPHOS, BILITOT, PROT, ALBUMIN in the last 168 hours. No results for input(s): LIPASE, AMYLASE in the last 168 hours. No results for input(s): AMMONIA in the last 168 hours. Coagulation Profile: Recent Labs  Lab 08/15/21 2358  INR 0.9    Cardiac Enzymes: No results for input(s): CKTOTAL, CKMB, CKMBINDEX, TROPONINI in the last 168 hours. BNP (last 3 results) No results for input(s): PROBNP in the last 8760 hours. HbA1C: No results for input(s): HGBA1C in the last 72 hours. CBG: No results for input(s): GLUCAP in the last 168 hours. Lipid Profile: No results for input(s): CHOL, HDL, LDLCALC, TRIG,  CHOLHDL, LDLDIRECT in the last 72 hours. Thyroid Function Tests: No results for input(s): TSH, T4TOTAL, FREET4, T3FREE, THYROIDAB in the last 72 hours. Anemia Panel: No results for input(s): VITAMINB12, FOLATE, FERRITIN, TIBC, IRON, RETICCTPCT in the last 72 hours.  Sepsis Labs: Recent Labs  Lab 08/15/21 2358  LATICACIDVEN 0.5     Recent Results (from the past 240 hour(s))  Resp Panel by RT-PCR (Flu A&B, Covid) Nasopharyngeal Swab     Status: None   Collection Time: 08/16/21  3:51 AM   Specimen: Nasopharyngeal Swab; Nasopharyngeal(NP) swabs in vial transport medium  Result Value Ref Range Status   SARS Coronavirus 2 by RT PCR NEGATIVE NEGATIVE Final    Comment: (NOTE) SARS-CoV-2 target nucleic acids are NOT DETECTED.  The SARS-CoV-2 RNA is generally detectable in upper respiratory specimens during the acute phase of infection. The lowest concentration of SARS-CoV-2 viral copies this assay can detect is 138 copies/mL. A negative result does not preclude SARS-Cov-2 infection and should not be used as the sole basis  for treatment or other patient management decisions. A negative result may occur with  improper specimen collection/handling, submission of specimen other than nasopharyngeal swab, presence of viral mutation(s) within the areas targeted by this assay, and inadequate number of viral copies(<138 copies/mL). A negative result must be combined with clinical observations, patient history, and epidemiological information. The expected result is Negative.  Fact Sheet for Patients:  EntrepreneurPulse.com.au  Fact Sheet for Healthcare Providers:  IncredibleEmployment.be  This test is no t yet approved or cleared by the [place] FDA and  has been authorized for detection and/or diagnosis of SARS-CoV-2 by FDA under an Emergency Use Authorization (EUA). This EUA will remain  in effect (meaning this test can be used) for the duration of  the COVID-19 declaration under Section 564(b)(1) of the Act, 21 U.S.C.section 360bbb-3(b)(1), unless the authorization is terminated  or revoked sooner.       Influenza A by PCR NEGATIVE NEGATIVE Final   Influenza B by PCR NEGATIVE NEGATIVE Final    Comment: (NOTE) The Xpert Xpress SARS-CoV-2/FLU/RSV plus assay is intended as an aid in the diagnosis of influenza from Nasopharyngeal swab specimens and should not be used as a sole basis for treatment. Nasal washings and aspirates are unacceptable for Xpert Xpress SARS-CoV-2/FLU/RSV testing.  Fact Sheet for Patients: EntrepreneurPulse.com.au  Fact Sheet for Healthcare Providers: IncredibleEmployment.be  This test is not yet approved or cleared by the [place] FDA and has been authorized for detection and/or diagnosis of SARS-CoV-2 by FDA under an Emergency Use Authorization (EUA). This EUA will remain in effect (meaning this test can be used) for the duration of the COVID-19 declaration under Section 564(b)(1) of the Act, 21 U.S.C. section 360bbb-3(b)(1), unless the authorization is terminated or revoked.  Performed at [facility], [place] [address] [place], [place] [address]   Culture, blood (routine x 2)     Status: None (Preliminary result)   Collection Time: 08/16/21  7:15 AM   Specimen: BLOOD  Result Value Ref Range Status   Specimen Description   Final    BLOOD LEFT ANTECUBITAL Performed at [facility] [address] [place] [address], [place], [place] [address]    Special Requests   Final    BOTTLES DRAWN AEROBIC AND ANAEROBIC Blood Culture results may not be optimal due to an inadequate volume of blood received in culture bottles Performed at [facility] [address], [place], [place] [address]    Culture   Final    NO GROWTH 4 DAYS Performed at [facility], [place] [address], [place], [place] [address]    Report Status  PENDING  Incomplete  Culture, blood (routine x 2)     Status: None (Preliminary result)   Collection Time: 08/16/21  7:40 AM   Specimen: BLOOD  Result Value Ref Range Status   Specimen Description   Final    BLOOD LEFT ANTECUBITAL Performed at [facility] [address] [place], [place] [address]    Special Requests   Final    BOTTLES DRAWN AEROBIC AND ANAEROBIC Blood Culture results may not be optimal due to an inadequate volume of blood received in culture bottles Performed at [facility] [address], [place], [place] [address]    Culture   Final    NO GROWTH 4 DAYS Performed at [facility], [place] [address] [place], [place] [address]    Report Status PENDING  Incomplete  Radiology Studies: No results found.      Scheduled Meds:  sodium chloride   Intravenous Once   ALPRAZolam  1 mg Oral QHS   amLODipine  10 mg Oral Daily   atorvastatin  80 mg Oral QHS   budesonide (PULMICORT) nebulizer solution  0.25 mg Nebulization BID   calcium acetate  2,001 mg Oral TID with meals   Chlorhexidine Gluconate Cloth  6 each Topical Q0600   ezetimibe  10 mg Oral Daily   ipratropium-albuterol  3 mL Nebulization TID   isosorbide mononitrate  60 mg Oral Daily   labetalol  100 mg Oral BID   lidocaine  1 patch Transdermal Q24H   mouth rinse  15 mL Mouth Rinse BID   pantoprazole  40 mg Oral Q0600   venlafaxine XR  300 mg Oral Q breakfast   Continuous Infusions:          Aline August, MD [facility] 08/21/2021, 7:19 AM

## 2021-08-21 NOTE — Plan of Care (Signed)

## 2021-08-21 NOTE — Procedures (Signed)
Interventional Radiology Procedure Note  Procedure: Tunneled HD Catheter   Indication: Renal failure  Findings: Please refer to procedural dictation for full description.  Complications: None  EBL: < 10 mL  Miachel Roux, MD [phone]

## 2021-08-21 NOTE — Procedures (Signed)
Patient was seen on dialysis and the procedure was supervised.  BFR 250  Via TDC BP is  137/67.   Patient appears to be tolerating treatment well  Louis Meckel 08/21/2021

## 2021-08-21 NOTE — Progress Notes (Signed)
[facility] Progress Note   Subjective:   Seen in room, s/p TDC placement this AM. Reports pain at catheter insertion site and in back (takes pain meds at home for this). Reports mild SOB and edema. Denies CP, palpitations, dizziness, abdominal pain and nausea.   Objective Vitals:   08/21/21 0850 08/21/21 0855 08/21/21 0900 08/21/21 0939  BP: (!) 190/87 (!) 190/80 (!) 183/85 (!) 176/83  Pulse: 79 77 78 75  Resp: 18 20 18 16   Temp:    97.9 F (36.6 C)  TempSrc:    Axillary  SpO2: 94% 93% 93% 97%  Weight:      Height:       Physical Exam General: WDWN female, alert and in NAD Heart: RRR, no murmurs, rubs or gallops Lungs: Scattered rhonchi b/l lower lobes. Normal WOB on O2 2L Abdomen: Soft, non-tender, non-distended, +BS Extremities: Trace edema L ankle, no edmea RLE Dialysis Access: R [facility]  Additional Objective Labs: Basic Metabolic Panel: Recent Labs  Lab 08/19/21 0307 08/20/21 0414 08/21/21 0259  NA 134* 135 136  K 3.9 4.1 3.9  CL 104 105 107  CO2 21* 19* 20*  GLUCOSE 83 79 77  BUN 47* 43* 37*  CREATININE 5.94* 5.86* 5.66*  CALCIUM 7.8* 7.8* 8.1*   CBC: Recent Labs  Lab 08/15/21 1108 08/15/21 1551 08/15/21 2358 08/17/21 0453 08/18/21 0540 08/18/21 1600 08/19/21 0307 08/20/21 0414 08/21/21 0259  WBC 8.7 8.6  --  11.3* 7.8  --  8.0 7.8 7.5  NEUTROABS 7.0 6.8  --   --   --   --   --   --   --   HGB 9.4* 9.4*   < > 8.1* 7.1*   < > 8.5* 8.9* 9.0*  HCT 27.2* 27.4*   < > 23.7* 21.3*   < > 24.6* 26.5* 26.7*  MCV 89.7 89.3  --  89.1 91.4  --  89.5 91.4 90.5  PLT 509.0* 433*  --  360 318  --  334 388 381   < > = values in this interval not displayed.   Blood Culture    Component Value Date/Time   SDES  08/16/2021 0740    BLOOD LEFT ANTECUBITAL Performed at [facility] [address] [place], [place] [address]    [place]  08/16/2021 0740    BOTTLES DRAWN AEROBIC AND ANAEROBIC Blood Culture results may not be  optimal due to an inadequate volume of blood received in culture bottles Performed at [facility], [place] [address] [place], [place] [address]    CULT  08/16/2021 0740    NO GROWTH 4 DAYS Performed at [facility] [address], [place], [place] [address]    REPTSTATUS PENDING 08/16/2021 0740    Medications:   sodium chloride   Intravenous Once   ALPRAZolam  1 mg Oral QHS   amLODipine  10 mg Oral Daily   atorvastatin  80 mg Oral QHS   budesonide (PULMICORT) nebulizer solution  0.25 mg Nebulization BID   calcium acetate  2,001 mg Oral TID with meals   Chlorhexidine Gluconate Cloth  6 each Topical Q0600   ezetimibe  10 mg Oral Daily   ipratropium-albuterol  3 mL Nebulization TID   isosorbide mononitrate  60 mg Oral Daily   labetalol  100 mg Oral BID   lidocaine  1 patch Transdermal Q24H   mouth rinse  15 mL Mouth Rinse BID   pantoprazole  40  mg Oral Q0600   venlafaxine XR  300 mg Oral Q breakfast    Dialysis Orders: new start, was accepted to [facility] prior to admission  Assessment/Plan: CKD V - creat worsening over last few mos from 2.8 in Oct to 3.8 in Sept and mid 5's now in November. Pt admitted for GIB.  Patient was planned to start outpatient dialysis this week per her primary nephrologist, Dr. Joylene Grapes. AVG on hold due to GI bleed. New TDC placed today. Planned for first HD this afternoon.  GI bleed/ colitis - Hb 9.0 this am, +melena and +FOB. EGD 11/18 negative. Capsule endoscopy is pending. S/p 1u prbcs here. Flagyl for colitis.  HTN - BP's normal to high-normal. Cont home meds norvasc + labetalol. Slightly volume overloaded likely due to PRBC transfusions and ESRD, lower volume slowly with HD as tolerated.  PNA - on rocephin and Zmax COPD - on O2, reports SOB slightly worse than usual, will plan to lower EDW with HD as tolerated.  Anemia - Hb 7.1 on admission, got 1u prbc's and repeat is 9.0. Will plan to start ESA with HD.  MBD ckd - Calcium  controlled. Will check phos with next labs. cont binders -phoslo 3 ac tid.   Anice Paganini, PA-C 08/21/2021, 10:32 AM  [facility] Pager: [phone]

## 2021-08-22 DIAGNOSIS — K922 Gastrointestinal hemorrhage, unspecified: Secondary | ICD-10-CM | POA: Diagnosis not present

## 2021-08-22 DIAGNOSIS — N179 Acute kidney failure, unspecified: Secondary | ICD-10-CM | POA: Diagnosis not present

## 2021-08-22 DIAGNOSIS — J189 Pneumonia, unspecified organism: Secondary | ICD-10-CM | POA: Diagnosis not present

## 2021-08-22 DIAGNOSIS — J9601 Acute respiratory failure with hypoxia: Secondary | ICD-10-CM | POA: Diagnosis not present

## 2021-08-22 LAB — HEPATITIS B SURFACE ANTIBODY, QUANTITATIVE: Hep B S AB Quant (Post): <3.1 m[IU]/mL — ABNORMAL LOW (ref >9.9)

## 2021-08-22 MED ORDER — PANTOPRAZOLE SODIUM 40 MG PO TBEC: 40.0000 mg | DELAYED_RELEASE_TABLET | Freq: Every day | ORAL | Status: AC

## 2021-08-22 MED ORDER — ALBUTEROL SULFATE HFA 108 (90 BASE) MCG/ACT IN AERS: 2.0000 | INHALATION_SPRAY | Freq: Four times a day (QID) | RESPIRATORY_TRACT | 0 refills | Status: AC | PRN

## 2021-08-22 MED ORDER — OXYCODONE-ACETAMINOPHEN 5-325 MG PO TABS
1.0000 | ORAL_TABLET | Freq: Once | ORAL | Status: AC
  Administered 2021-08-22: 1 via ORAL
  Filled 2021-08-22: qty 1

## 2021-08-22 MED ORDER — OXYCODONE-ACETAMINOPHEN 5-325 MG PO TABS: 1.0000 | ORAL_TABLET | Freq: Four times a day (QID) | ORAL | 0 refills | Status: DC | PRN

## 2021-08-22 NOTE — Plan of Care (Signed)
  Problem: Education: Goal: Knowledge of disease and its progression will improve Outcome: Progressing   Problem: Fluid Volume: Goal: Compliance with measures to maintain balanced fluid volume will improve Outcome: Progressing   Problem: Health Behavior/Discharge Planning: Goal: Ability to manage health-related needs will improve Outcome: Progressing   Problem: Nutritional: Goal: Ability to make healthy dietary choices will improve Outcome: Progressing

## 2021-08-22 NOTE — Plan of Care (Signed)
  Problem: Education: Goal: Knowledge of General Education information will improve Description: Including pain rating scale, medication(s)/side effects and non-pharmacologic comfort measures Outcome: Progressing   Problem: Clinical Measurements: Goal: Respiratory complications will improve Outcome: Progressing   Problem: Coping: Goal: Level of anxiety will decrease Outcome: Progressing   Problem: Education: Goal: Knowledge of disease and its progression will improve Outcome: Progressing   Problem: Fluid Volume: Goal: Compliance with measures to maintain balanced fluid volume will improve Outcome: Progressing   Problem: Health Behavior/Discharge Planning: Goal: Ability to manage health-related needs will improve Outcome: Progressing   Problem: Nutritional: Goal: Ability to make healthy dietary choices will improve Outcome: Progressing

## 2021-08-22 NOTE — Progress Notes (Signed)
DISCHARGE NOTE HOME [name] to be discharged Home per MD order. Discussed prescriptions and follow up appointments with the patient. Prescriptions given to patient; medication list explained in detail. Patient verbalized understanding.  Skin clean, dry and intact without evidence of skin break down, no evidence of skin tears noted. IV catheter discontinued intact. Site without signs and symptoms of complications. Dressing and pressure applied. Pt denies pain at the site currently. No complaints noted.  Patient free of lines, drains, and wounds.   An After Visit Summary (AVS) was printed and given to the patient. Patient escorted via wheelchair, and discharged home via private auto.  Dolores Hoose, RN

## 2021-08-22 NOTE — Progress Notes (Signed)
SaO2 on room air at rest = 85% SaO2 on room air while ambulating = 83% SaO2 on 3 liters of O2 at rest  = 91%   Patient needs home oxygen to maintain O2 saturations greater than 88%  Ibn Stief, PT, GCS 08/22/21,11:01 AM

## 2021-08-22 NOTE — Progress Notes (Signed)
[name], female   DOB: 1961-05-11, 60 y.o.   MRN: [id number]  PROGRESS NOTE    [name]  IHK:[id number] DOB: 04-13-1961 DOA: 08/15/2021 PCP: Bernerd Limbo, MD   Brief Narrative:  60 y.o. female with medical history significant of hypertension, hyperlipidemia, chronic diastolic CHF, CAD, aortic stenosis status post stent, COPD, tobacco use, PAD status post aortic stent/SMA stent/bilateral common iliac artery stent, CKD stage V, chronic pain, anxiety, depression, chronic mesenteric ischemia, history of GI bleed with recent admission in September 2022, chronic blood loss anemia presented with hematochezia and melena.  She was admitted for acute respiratory failure with hypoxia of with possible pneumonia and started on antibiotics along with GI bleeding with imaging suspicious for colitis for which Flagyl was also added.  GI was consulted.  EGD was unremarkable ; she had capsule endoscopy on 08/18/2021.  She had worsening AKI for which nephrology recommended transfer to [facility] for possible need for dialysis.  Status post HD catheter placement by IR on 08/21/2021 and initiation of hemodialysis on 08/21/2021.  Assessment & Plan:   Acute on chronic blood loss anemia Probable acute GI bleeding Possible colitis -status post unremarkable EGD on 08/18/2021; capsule endoscopy showed no evidence of active bleeding nor blood in the small bowel; colonic erosion versus polyp, but not bleeding; no blood seen in the colon. -Status post 1 unit packed red cell transfusion on 08/18/2021 for hemoglobin of 7.1.  Hemoglobin 9 on 08/21/2021.  Monitor.  Continue daily Protonix for now. -GI signed off on 08/19/2021: Flagyl discontinued on 08/19/2021 per GI recommendations as GI thinks that patient does not have colitis.  Outpatient follow-up with GI  Acute respiratory failure with hypoxia Possible community-acquired bacterial pneumonia -Currently on room air.  Completed 5-day course  of Rocephin and Zithromax.  Blood cultures negative so far.  COVID 19/influenza PCR negative on presentation.  AKI on CKD stage IV -Nephrology following.  Transferred to [facility] for possible dialysis catheter placement and starting HD. S/P HD catheter placement by IR today. -Creatinine 5.66 on 08/21/2021.  HTN -Continue amlodipine, isosorbide mononitrate and labetalol  Chronic diastolic CHF -Currently looks euvolemic.  Strict input and output.  Daily weights.  Hyperlipidemia -Continue statin and Zetia  Anxiety and depression -Continue venlafaxine  Generalized deconditioning -PT eval   DVT prophylaxis: SCDs Code Status: Full Family Communication: Grandson at bedside on 08/21/2021 Disposition Plan: Status is: Inpatient  Remains inpatient appropriate because: Of need for ongoing dialysis  Consultants: Nephrology/GI/IR  Procedures:  EGD on 08/18/2021.  Capsule endoscopy on 08/18/2021 HD catheter placement by IR on 08/21/2021 and initiation of hemodialysis on 08/21/2021.  Antimicrobials:  Rocephin and Zithromax from 08/15/2021-08/20/2021  Subjective: Patient seen and examined at bedside.  Feels slightly better.  He denies worsening fever, nausea, vomiting or shortness of breath.   Vitals:   08/21/21 2003 08/22/21 0416 08/22/21 0846 08/22/21 0848  BP:  (!) 174/84    Pulse:  70    Resp:      Temp:  97.9 F (36.6 C)    TempSrc:  Oral    SpO2: 95% 94% 94% 94%  Weight:      Height:        Intake/Output Summary (Last 24 hours) at 08/22/2021 0912 Last data filed at 08/22/2021 0500 Gross per 24 hour  Intake 934 ml  Output --  Net 934 ml    Filed Weights   08/21/21 0500 08/21/21 1247 08/21/21 1607  Weight: 44.7 kg 45.8  kg 45.4 kg    Examination:  General exam: On room air currently.  No distress.  Looks chronically ill.   Respiratory system: Bilateral decreased breath sounds at bases with some scattered crackles  cardiovascular system: S1-S2 heard;  rate controlled gastrointestinal system: Abdomen is mildly distended; soft and nontender.  Bowel sounds are heard  extremities: No clubbing; trace lower extremity edema present  Central nervous system: Awake and alert.  No focal neurological deficits.  Moving extremities  skin: No obvious petechiae/lesions psychiatry: Affect is flat  Data Reviewed: I have personally reviewed following labs and imaging studies  CBC: Recent Labs  Lab 08/15/21 1108 08/15/21 1551 08/15/21 2358 08/17/21 0453 08/18/21 0540 08/18/21 1600 08/19/21 0307 08/20/21 0414 08/21/21 0259  WBC 8.7 8.6  --  11.3* 7.8  --  8.0 7.8 7.5  NEUTROABS 7.0 6.8  --   --   --   --   --   --   --   HGB 9.4* 9.4*   < > 8.1* 7.1* 9.3* 8.5* 8.9* 9.0*  HCT 27.2* 27.4*   < > 23.7* 21.3* 27.5* 24.6* 26.5* 26.7*  MCV 89.7 89.3  --  89.1 91.4  --  89.5 91.4 90.5  PLT 509.0* 433*  --  360 318  --  334 388 381   < > = values in this interval not displayed.    Basic Metabolic Panel: Recent Labs  Lab 08/17/21 0453 08/18/21 0540 08/19/21 0307 08/20/21 0414 08/21/21 0259  NA 131* 132* 134* 135 136  K 3.6 3.7 3.9 4.1 3.9  CL 102 103 104 105 107  CO2 19* 21* 21* 19* 20*  GLUCOSE 94 70 83 79 77  BUN 44* 48* 47* 43* 37*  CREATININE 5.25* 5.69* 5.94* 5.86* 5.66*  CALCIUM 7.9* 7.6* 7.8* 7.8* 8.1*    GFR: Estimated Creatinine Clearance: 7.8 mL/min (A) (by C-G formula based on SCr of 5.66 mg/dL (H)). Liver Function Tests: No results for input(s): AST, ALT, ALKPHOS, BILITOT, PROT, ALBUMIN in the last 168 hours. No results for input(s): LIPASE, AMYLASE in the last 168 hours. No results for input(s): AMMONIA in the last 168 hours. Coagulation Profile: Recent Labs  Lab 08/15/21 2358  INR 0.9    Cardiac Enzymes: No results for input(s): CKTOTAL, CKMB, CKMBINDEX, TROPONINI in the last 168 hours. BNP (last 3 results) No results for input(s): PROBNP in the last 8760 hours. HbA1C: No results for input(s): HGBA1C in the last 72  hours. CBG: No results for input(s): GLUCAP in the last 168 hours. Lipid Profile: No results for input(s): CHOL, HDL, LDLCALC, TRIG, CHOLHDL, LDLDIRECT in the last 72 hours. Thyroid Function Tests: No results for input(s): TSH, T4TOTAL, FREET4, T3FREE, THYROIDAB in the last 72 hours. Anemia Panel: No results for input(s): VITAMINB12, FOLATE, FERRITIN, TIBC, IRON, RETICCTPCT in the last 72 hours.  Sepsis Labs: Recent Labs  Lab 08/15/21 2358  LATICACIDVEN 0.5     Recent Results (from the past 240 hour(s))  Resp Panel by RT-PCR (Flu A&B, Covid) Nasopharyngeal Swab     Status: None   Collection Time: 08/16/21  3:51 AM   Specimen: Nasopharyngeal Swab; Nasopharyngeal(NP) swabs in vial transport medium  Result Value Ref Range Status   SARS Coronavirus 2 by RT PCR NEGATIVE NEGATIVE Final    Comment: (NOTE) SARS-CoV-2 target nucleic acids are NOT DETECTED.  The SARS-CoV-2 RNA is generally detectable in upper respiratory specimens during the acute phase of infection. The lowest concentration of SARS-CoV-2 viral copies this assay  can detect is 138 copies/mL. A negative result does not preclude SARS-Cov-2 infection and should not be used as the sole basis for treatment or other patient management decisions. A negative result may occur with  improper specimen collection/handling, submission of specimen other than nasopharyngeal swab, presence of viral mutation(s) within the areas targeted by this assay, and inadequate number of viral copies(<138 copies/mL). A negative result must be combined with clinical observations, patient history, and epidemiological information. The expected result is Negative.  Fact Sheet for Patients:  EntrepreneurPulse.com.au  Fact Sheet for Healthcare Providers:  IncredibleEmployment.be  This test is no t yet approved or cleared by the [place] FDA and  has been authorized for detection and/or diagnosis of SARS-CoV-2  by FDA under an Emergency Use Authorization (EUA). This EUA will remain  in effect (meaning this test can be used) for the duration of the COVID-19 declaration under Section 564(b)(1) of the Act, 21 U.S.C.section 360bbb-3(b)(1), unless the authorization is terminated  or revoked sooner.       Influenza A by PCR NEGATIVE NEGATIVE Final   Influenza B by PCR NEGATIVE NEGATIVE Final    Comment: (NOTE) The Xpert Xpress SARS-CoV-2/FLU/RSV plus assay is intended as an aid in the diagnosis of influenza from Nasopharyngeal swab specimens and should not be used as a sole basis for treatment. Nasal washings and aspirates are unacceptable for Xpert Xpress SARS-CoV-2/FLU/RSV testing.  Fact Sheet for Patients: EntrepreneurPulse.com.au  Fact Sheet for Healthcare Providers: IncredibleEmployment.be  This test is not yet approved or cleared by the [place] FDA and has been authorized for detection and/or diagnosis of SARS-CoV-2 by FDA under an Emergency Use Authorization (EUA). This EUA will remain in effect (meaning this test can be used) for the duration of the COVID-19 declaration under Section 564(b)(1) of the Act, 21 U.S.C. section 360bbb-3(b)(1), unless the authorization is terminated or revoked.  Performed at [facility], [place] [address] [place], [place] [address]   Culture, blood (routine x 2)     Status: None   Collection Time: 08/16/21  7:15 AM   Specimen: BLOOD  Result Value Ref Range Status   Specimen Description   Final    BLOOD LEFT ANTECUBITAL Performed at [place] [address] [place], [place] [address]    Special Requests   Final    BOTTLES DRAWN AEROBIC AND ANAEROBIC Blood Culture results may not be optimal due to an inadequate volume of blood received in culture bottles Performed at [place] [address] [place], [place] [address]    Culture   Final    NO  GROWTH 5 DAYS Performed at [facility], [place] [address] [place], [place] [address]    Report Status 08/21/2021 FINAL  Final  Culture, blood (routine x 2)     Status: None   Collection Time: 08/16/21  7:40 AM   Specimen: BLOOD  Result Value Ref Range Status   Specimen Description   Final    BLOOD LEFT ANTECUBITAL Performed at [place] [address], [place], [place] [address]    Special Requests   Final    BOTTLES DRAWN AEROBIC AND ANAEROBIC Blood Culture results may not be optimal due to an inadequate volume of blood received in culture bottles Performed at [place] [address], [place],  [address]    Culture   Final    NO GROWTH 5 DAYS Performed at [facility], [address] [place], [place] [address]    Report Status 08/21/2021 FINAL  Final          Radiology Studies: IR Fluoro Guide CV Line Right  Result Date: 08/21/2021 INDICATION: Initiation of dialysis for chronic kidney disease stage 5 EXAM: Ultrasound fluoroscopy guided tunneled dialysis catheter placement MEDICATIONS: Ancef 2 g IV; The antibiotic was administered within an appropriate time interval prior to skin puncture. ANESTHESIA/SEDATION: Moderate (conscious) sedation was employed during this procedure. A total of Versed 1 mg and Fentanyl 25 mcg was administered intravenously by the radiology nurse. Total intra-service moderate Sedation Time: 9 minutes. [name] level of consciousness and vital signs were monitored continuously by radiology nursing throughout the procedure under my direct supervision. FLUOROSCOPY TIME:  Fluoroscopy Time: 0 minutes 12 seconds (1 mGy). COMPLICATIONS: None immediate. PROCEDURE: Informed written consent was obtained from the patient after a thorough discussion of the procedural risks, benefits and alternatives. All questions were addressed. Maximal Sterile Barrier Technique was utilized including caps, mask,  sterile gowns, sterile gloves, sterile drape, hand hygiene and skin antiseptic. A timeout was performed prior to the initiation of the procedure. The right internal jugular vein was evaluated with ultrasound and shown to be patent. A permanent ultrasound image was obtained and placed in the patient's medical record. Using sterile gel and a sterile probe cover, the right internal jugular vein was entered with a 21 ga needle during real time ultrasound guidance. 0.018 inch guidewire placed and 21 ga needle exchanged for transitional dilator set. Utilizing fluoroscopy, 0.035 inch guidewire advanced through the needle without difficulty. Seriel dilation was performed and peel-away sheath was placed. Attention then turned to the right anterior upper chest. Following local lidocaine administration, the hemodialysis catheter was tunneled from the chest wall to the venotomy site. The catheter was inserted through the peel-away sheath. The tip of the catheter was positioned within the right atrium using fluoroscopic guidance. All lumens of the catheter aspirated and flushed well. The dialysis lumens were locked with Heparin. The catheter was secured to the skin with suture. The insertion site was covered with sterile dressing. IMPRESSION: 19 cm tunneled right IJ hemodialysis catheter is ready for use. Electronically Signed   By: Miachel Roux M.D.   On: 08/21/2021 13:01   IR US Guide Vasc Access Right  Result Date: 08/21/2021 INDICATION: Initiation of dialysis for chronic kidney disease stage 5 EXAM: Ultrasound fluoroscopy guided tunneled dialysis catheter placement MEDICATIONS: Ancef 2 g IV; The antibiotic was administered within an appropriate time interval prior to skin puncture. ANESTHESIA/SEDATION: Moderate (conscious) sedation was employed during this procedure. A total of Versed 1 mg and Fentanyl 25 mcg was administered intravenously by the radiology nurse. Total intra-service moderate Sedation Time: 9 minutes.  [name] level of consciousness and vital signs were monitored continuously by radiology nursing throughout the procedure under my direct supervision. FLUOROSCOPY TIME:  Fluoroscopy Time: 0 minutes 12 seconds (1 mGy). COMPLICATIONS: None immediate. PROCEDURE: Informed written consent was obtained from the patient after a thorough discussion of the procedural risks, benefits and alternatives. All questions were addressed. Maximal Sterile Barrier Technique was utilized including caps, mask, sterile gowns, sterile gloves, sterile drape, hand hygiene and skin antiseptic. A timeout was performed prior to the initiation of the procedure. The right internal jugular vein was evaluated with ultrasound and shown to be patent. A permanent ultrasound image was obtained and placed in the patient's medical record. Using sterile gel and a sterile probe cover, the right internal jugular vein  was entered with a 21 ga needle during real time ultrasound guidance. 0.018 inch guidewire placed and 21 ga needle exchanged for transitional dilator set. Utilizing fluoroscopy, 0.035 inch guidewire advanced through the needle without difficulty. Seriel dilation was performed and peel-away sheath was placed. Attention then turned to the right anterior upper chest. Following local lidocaine administration, the hemodialysis catheter was tunneled from the chest wall to the venotomy site. The catheter was inserted through the peel-away sheath. The tip of the catheter was positioned within the right atrium using fluoroscopic guidance. All lumens of the catheter aspirated and flushed well. The dialysis lumens were locked with Heparin. The catheter was secured to the skin with suture. The insertion site was covered with sterile dressing. IMPRESSION: 19 cm tunneled right IJ hemodialysis catheter is ready for use. Electronically Signed   By: Miachel Roux M.D.   On: 08/21/2021 13:01        Scheduled Meds:  sodium chloride   Intravenous Once    ALPRAZolam  1 mg Oral QHS   amLODipine  10 mg Oral Daily   atorvastatin  80 mg Oral QHS   budesonide (PULMICORT) nebulizer solution  0.25 mg Nebulization BID   calcium acetate  2,001 mg Oral TID with meals   Chlorhexidine Gluconate Cloth  6 each Topical Q0600   darbepoetin (ARANESP) injection - DIALYSIS  25 mcg Intravenous Q Mon-HD   ezetimibe  10 mg Oral Daily   ipratropium-albuterol  3 mL Nebulization TID   isosorbide mononitrate  60 mg Oral Daily   labetalol  100 mg Oral BID   lidocaine  1 patch Transdermal Q24H   mouth rinse  15 mL Mouth Rinse BID   pantoprazole  40 mg Oral Q0600   venlafaxine XR  300 mg Oral Q breakfast   Continuous Infusions:          Aline August, MD [facility] 08/22/2021, 9:12 AM

## 2021-08-22 NOTE — Plan of Care (Signed)
Problem: Education: Goal: Knowledge of General Education information will improve Description: Including pain rating scale, medication(s)/side effects and non-pharmacologic comfort measures 08/22/2021 1133 by Dolores Hoose, RN Outcome: Adequate for Discharge 08/22/2021 1058 by Dolores Hoose, RN Outcome: Progressing   Problem: Health Behavior/Discharge Planning: Goal: Ability to manage health-related needs will improve Outcome: Adequate for Discharge   Problem: Clinical Measurements: Goal: Ability to maintain clinical measurements within normal limits will improve Outcome: Adequate for Discharge Goal: Will remain free from infection Outcome: Adequate for Discharge Goal: Diagnostic test results will improve Outcome: Adequate for Discharge Goal: Respiratory complications will improve 08/22/2021 1133 by Dolores Hoose, RN Outcome: Adequate for Discharge 08/22/2021 1058 by Dolores Hoose, RN Outcome: Progressing Goal: Cardiovascular complication will be avoided Outcome: Adequate for Discharge   Problem: Nutrition: Goal: Adequate nutrition will be maintained Outcome: Adequate for Discharge   Problem: Coping: Goal: Level of anxiety will decrease 08/22/2021 1133 by Dolores Hoose, RN Outcome: Adequate for Discharge 08/22/2021 1058 by Dolores Hoose, RN Outcome: Progressing   Problem: Elimination: Goal: Will not experience complications related to bowel motility Outcome: Adequate for Discharge Goal: Will not experience complications related to urinary retention Outcome: Adequate for Discharge   Problem: Pain Managment: Goal: General experience of comfort will improve Outcome: Adequate for Discharge   Problem: Safety: Goal: Ability to remain free from injury will improve Outcome: Adequate for Discharge   Problem: Skin Integrity: Goal: Risk for impaired skin integrity will decrease Outcome: Adequate for Discharge   Problem: Education: Goal: Ability  to identify signs and symptoms of gastrointestinal bleeding will improve Outcome: Adequate for Discharge   Problem: Education: Goal: Knowledge of General Education information will improve Description: Including pain rating scale, medication(s)/side effects and non-pharmacologic comfort measures Outcome: Adequate for Discharge   Problem: Health Behavior/Discharge Planning: Goal: Ability to manage health-related needs will improve Outcome: Adequate for Discharge   Problem: Clinical Measurements: Goal: Ability to maintain clinical measurements within normal limits will improve Outcome: Adequate for Discharge Goal: Will remain free from infection Outcome: Adequate for Discharge Goal: Diagnostic test results will improve Outcome: Adequate for Discharge Goal: Respiratory complications will improve Outcome: Adequate for Discharge Goal: Cardiovascular complication will be avoided Outcome: Adequate for Discharge   Problem: Activity: Goal: Risk for activity intolerance will decrease Outcome: Adequate for Discharge   Problem: Nutrition: Goal: Adequate nutrition will be maintained Outcome: Adequate for Discharge   Problem: Coping: Goal: Level of anxiety will decrease Outcome: Adequate for Discharge   Problem: Elimination: Goal: Will not experience complications related to bowel motility Outcome: Adequate for Discharge Goal: Will not experience complications related to urinary retention Outcome: Adequate for Discharge   Problem: Pain Managment: Goal: General experience of comfort will improve Outcome: Adequate for Discharge   Problem: Safety: Goal: Ability to remain free from injury will improve Outcome: Adequate for Discharge   Problem: Skin Integrity: Goal: Risk for impaired skin integrity will decrease Outcome: Adequate for Discharge   Problem: Education: Goal: Knowledge of disease and its progression will improve 08/22/2021 1133 by Dolores Hoose, RN Outcome:  Adequate for Discharge 08/22/2021 6237 by Dolores Hoose, RN Outcome: Progressing Goal: Individualized Educational Video(s) Outcome: Adequate for Discharge   Problem: Fluid Volume: Goal: Compliance with measures to maintain balanced fluid volume will improve 08/22/2021 1133 by Dolores Hoose, RN Outcome: Adequate for Discharge 08/22/2021 0916 by Dolores Hoose, RN Outcome: Progressing   Problem: Health Behavior/Discharge Planning: Goal: Ability to manage health-related needs will improve 08/22/2021 1133  by Dolores Hoose, RN Outcome: Adequate for Discharge 08/22/2021 [phone] by Dolores Hoose, RN Outcome: Progressing   Problem: Nutritional: Goal: Ability to make healthy dietary choices will improve 08/22/2021 1133 by Dolores Hoose, RN Outcome: Adequate for Discharge 08/22/2021 0916 by Dolores Hoose, RN Outcome: Progressing   Problem: Clinical Measurements: Goal: Complications related to the disease process, condition or treatment will be avoided or minimized Outcome: Adequate for Discharge

## 2021-08-22 NOTE — Progress Notes (Signed)
Rounded on patient today in correlation to transition to outpatient hemodialysis. Ordered Kidney Failure book. Patient educated at the bedside regarding care of tunneled dialysis catheter, AV graft site care (once it is placed), assessment of thrill daily and proper medication administration on HD days.   Patient reports that her daughter in law will assist her to and from hemodialysis treatments, but that she does have a car. Educated the patient to allow her daughter-in-law to assist her initially to see how she tolerates outpatient hemodialysis before trying to drive herself. Patient also educated on the importance of adhering to scheduled dialysis treatments, the effects of fluid overload, hyperkalemia and hyperphosphatemia. Patient capable of re-verbalizing via teach back method. Also educated patient on services available through the interdisciplinary team in the clinic setting. Patient with no further questions at this time. Handouts and contact information provided to patient for any further assistance. Will follow as appropriate.   Dorthey Sawyer, RN  Dialysis Nurse Coordinator Phone: [phone]

## 2021-08-22 NOTE — Progress Notes (Addendum)
[facility] Progress Note   Subjective:   Seen in room, on RA. Had 1st dialysis yesterday, no issues with treatment. States she was up walking the halls this morning. Ate breakfast. No sob, cp. Really wants to go home today. Set up at OP HD for tomorrow   Objective Vitals:   08/21/21 2003 08/22/21 0416 08/22/21 0846 08/22/21 0848  BP:  (!) 174/84    Pulse:  70    Resp:      Temp:  97.9 F (36.6 C)    TempSrc:  Oral    SpO2: 95% 94% 94% 94%  Weight:      Height:       Physical Exam General: WDWN female, alert and in NAD Heart: RRR, no murmurs, rubs or gallops Lungs: Scattered wheeze b/l lower lobes. Normal WOB on RA this am  Abdomen: Soft, non-tender, non-distended, +BS Extremities: Trace edema L ankle, no edmea RLE Dialysis Access: [facility]  Additional Objective Labs: Basic Metabolic Panel: Recent Labs  Lab 08/19/21 0307 08/20/21 0414 08/21/21 0259  NA 134* 135 136  K 3.9 4.1 3.9  CL 104 105 107  CO2 21* 19* 20*  GLUCOSE 83 79 77  BUN 47* 43* 37*  CREATININE 5.94* 5.86* 5.66*  CALCIUM 7.8* 7.8* 8.1*    CBC: Recent Labs  Lab 08/15/21 1108 08/15/21 1551 08/15/21 2358 08/17/21 0453 08/18/21 0540 08/18/21 1600 08/19/21 0307 08/20/21 0414 08/21/21 0259  WBC 8.7 8.6  --  11.3* 7.8  --  8.0 7.8 7.5  NEUTROABS 7.0 6.8  --   --   --   --   --   --   --   HGB 9.4* 9.4*   < > 8.1* 7.1*   < > 8.5* 8.9* 9.0*  HCT 27.2* 27.4*   < > 23.7* 21.3*   < > 24.6* 26.5* 26.7*  MCV 89.7 89.3  --  89.1 91.4  --  89.5 91.4 90.5  PLT 509.0* 433*  --  360 318  --  334 388 381   < > = values in this interval not displayed.    Blood Culture    Component Value Date/Time   SDES  08/16/2021 0740    BLOOD LEFT ANTECUBITAL Performed at [facility] [address], [place], [place] [address]    [place]  08/16/2021 0740    BOTTLES DRAWN AEROBIC AND ANAEROBIC Blood Culture results may not be optimal due to an inadequate volume of blood  received in culture bottles Performed at [facility], [place] [address], [place], [place] [address]    CULT  08/16/2021 0740    NO GROWTH 5 DAYS Performed at [facility] [address], [place], [place] [address]    REPTSTATUS 08/21/2021 FINAL 08/16/2021 0740    Medications:   sodium chloride   Intravenous Once   ALPRAZolam  1 mg Oral QHS   amLODipine  10 mg Oral Daily   atorvastatin  80 mg Oral QHS   budesonide (PULMICORT) nebulizer solution  0.25 mg Nebulization BID   calcium acetate  2,001 mg Oral TID with meals   Chlorhexidine Gluconate Cloth  6 each Topical Q0600   darbepoetin (ARANESP) injection - DIALYSIS  25 mcg Intravenous Q Mon-HD   ezetimibe  10 mg Oral Daily   ipratropium-albuterol  3 mL Nebulization TID   isosorbide mononitrate  60 mg Oral Daily   labetalol  100 mg Oral BID   lidocaine  1  patch Transdermal Q24H   mouth rinse  15 mL Mouth Rinse BID   pantoprazole  40 mg Oral Q0600   venlafaxine XR  300 mg Oral Q breakfast    Dialysis Orders: new start, was accepted to [facility] prior to admission  Assessment/Plan: CKD V - creat worsening over last few mos from 2.8 in Oct to 3.8 in Sept and mid 5's now in November. Pt admitted for GIB.  Patient was planned to start outpatient dialysis this week per her primary nephrologist, Dr. Joylene Grapes. AVG on hold due to GI bleed. [facility] and 1st HD on 08/21/21.  GI bleed/ colitis - Hb 9.0  +melena and +FOB. EGD 11/18 negative. Capsule endoscopy is pending. S/p 1u prbcs here. No further GI w/u here --F/u as OP. Hgb is stable HTN - BP's normal to high-normal. Cont home meds norvasc + labetalol. Slightly volume overloaded likely due to PRBC transfusions and ESRD, lower volume slowly with HD as tolerated.  PNA - Completed antibiotic course.  COPD - on O2, reports SOB slightly worse than usual, will plan to lower EDW with HD as tolerated. May need some O2 temporarily at home  Anemia - Hb 7.1 on admission, got  1u prbc's and repeat is 9.0. Will plan to start ESA with HD.  MBD ckd - Calcium controlled. Will check phos with next labs. cont binders -phoslo 3 ac tid.  Dispo:  Ok for discharge from renal standpoint. Per renal navigator, she can start at her outpatient clinic tomorrow 11/23.  Lynnda Child PA-C [facility] 08/22/2021,9:04 AM  Patient seen and examined, agree with above note with above modifications. Looks good, feels good-  is still on O2-  turned down to 2 liters O2 sat 95- may need some O2 at home temporarily-  all things are set up for her to get HD at the OP unit tomorrow  Corliss Parish, MD 08/22/2021

## 2021-08-22 NOTE — TOC Transition Note (Signed)
Transition of Care [facility]) - CM/SW Discharge Note   Patient Details  Name: BREYON BLASS MRN: [id number] Date of Birth: 1961-07-23  [facility] CM/SW Contact:  Tom-Johnson, Renea Ee, RN Phone Number: 08/22/2021, 11:44 AM   Clinical Narrative:    CM spoke with patient at bedside about needs for post hospital transition. States she lives with her grandson and boyfriend at home. Has one daughter and four grand children. Currently on disability. Independent with care and able to drive self prior to hospitalization. Has a shower chair at home.  Recently admitted in 09/22 for Anemia. Presented with shortness of breath, Rt sided chest pain, wheezing, blood in stool for over a week and bilateral lower quadrant abdominal pain. Admitted for acute respiratory failure. Started on antibiotics, GI following. CM consulted for home oxygen use. Thedore Mins [phone]) with [employer] called and order placed with acceptance voiced. PCP is Bernerd Limbo, MD and uses [employer]. Has [facility] Medicare. Boyfriend to transport at discharge. No further TOC needs noted.  Final next level of care: Home/Self Care Barriers to Discharge: Barriers Resolved   Patient Goals and CMS Choice Patient states their goals for this hospitalization and ongoing recovery are:: To go home CMS Medicare.gov Compare Post Acute Care list provided to:: Patient Choice offered to / list presented to : Patient  Discharge Placement                       Discharge Plan and Services                DME Arranged: Oxygen DME Agency: [employer] Date DME Agency Contacted: 08/22/21 Time DME Agency Contacted: [phone] Representative spoke with at DME Agency: [place]: NA [place] Agency: NA        Social Determinants of Health ([place]) Interventions     Readmission Risk Interventions No flowsheet data found.

## 2021-08-22 NOTE — Evaluation (Signed)
Physical Therapy Evaluation Patient Details Name: [name] MRN: [id number] DOB: [birth year] Today's Date: 08/22/2021  History of Present Illness  60 y.o. female with medical history significant of hypertension, hyperlipidemia, chronic diastolic CHF, CAD, aortic stenosis status post stent, COPD, tobacco use, PAD status post aortic stent/SMA stent/bilateral common iliac artery stent, CKD stage V, chronic pain, anxiety, depression, chronic mesenteric ischemia, history of GI bleed with recent admission in September 2022, chronic blood loss anemia presented with hematochezia and melena.  She was admitted for acute respiratory failure with hypoxia of with possible pneumonia and started on antibiotics along with GI bleeding with imaging suspicious for colitis for which Flagyl was also added.  GI was consulted.  EGD was unremarkable ; she had capsule endoscopy on 08/18/2021.  She had worsening AKI for which nephrology recommended transfer to [facility] for possible need for dialysis.  Status post HD catheter placement by IR on 08/21/2021 and initiation of hemodialysis on 08/21/2021.   Clinical Impression  Patient received in bed, reports she has been up walking around without difficulty. Agrees to my assessment. Received in bed with O2 turned off. Saturations at 85%. She got up and ambulated on room air with sats in 83%. Walked patient back to her room and placed her on 3 liters with saturations rising to 91%. Patient will need O2 at home. Otherwise she is moving independently and will be safe to return home with grandson at discharge.          Recommendations for follow up therapy are one component of a multi-disciplinary discharge planning process, led by the attending physician.  Recommendations may be updated based on patient status, additional functional criteria and insurance authorization.  Follow Up Recommendations No PT follow up    Assistance Recommended at Discharge Intermittent  Supervision/Assistance  Functional Status Assessment Patient has had a recent decline in their functional status and demonstrates the ability to make significant improvements in function in a reasonable and predictable amount of time.  Equipment Recommendations  None recommended by PT    Recommendations for Other Services       Precautions / Restrictions Precautions Precautions: Fall Restrictions Weight Bearing Restrictions: No      Mobility  Bed Mobility Overal bed mobility: Independent                  Transfers Overall transfer level: Independent                 General transfer comment: slightly impulsive    Ambulation/Gait Ambulation/Gait assistance: Supervision Gait Distance (Feet): 125 Feet Assistive device: None Gait Pattern/deviations: WFL(Within Functional Limits) Gait velocity: WNL     General Gait Details: patient ambulates without assistance or device. Received on room air with sats at 85%. Ambulated down hall o2 sats at 83%. Returned patient to [place] at 3 liters and she went up to 91%. Patient will need home O2.  Stairs            Wheelchair Mobility    Modified Rankin (Stroke Patients Only)       Balance Overall balance assessment: Independent                                           Pertinent Vitals/Pain Pain Assessment: No/denies pain Breathing: normal Negative Vocalization: none Facial Expression: smiling or inexpressive Body Language: relaxed Consolability: no need to console PAINAD  Score: 0    Home Living Family/patient expects to be discharged to:: Private residence Living Arrangements: Children Available Help at Discharge: Family;Available PRN/intermittently Type of Home: Mobile home Home Access: Stairs to enter Entrance Stairs-Rails: Right Entrance Stairs-Number of Steps: 4   Home Layout: One level Home Equipment: None      Prior Function Prior Level of Function : Independent/Modified  Independent             Mobility Comments: patient has been ambulating here on unit independently ADLs Comments: independent     Hand Dominance        Extremity/Trunk Assessment   Upper Extremity Assessment Upper Extremity Assessment: Overall WFL for tasks assessed    Lower Extremity Assessment Lower Extremity Assessment: Overall WFL for tasks assessed    Cervical / Trunk Assessment Cervical / Trunk Assessment: Normal  Communication   Communication: No difficulties  Cognition Arousal/Alertness: Awake/alert Behavior During Therapy: WFL for tasks assessed/performed Overall Cognitive Status: Within Functional Limits for tasks assessed                                          General Comments      Exercises     Assessment/Plan    PT Assessment Patient does not need any further PT services  PT Problem List         PT Treatment Interventions      PT Goals (Current goals can be found in the Care Plan section)  Acute Rehab PT Goals Patient Stated Goal: to return home PT Goal Formulation: With patient Time For Goal Achievement: 08/23/21 Potential to Achieve Goals: Good    Frequency     Barriers to discharge        Co-evaluation               AM-PAC PT "6 Clicks" Mobility  Outcome Measure Help needed turning from your back to your side while in a flat bed without using bedrails?: None Help needed moving from lying on your back to sitting on the side of a flat bed without using bedrails?: None Help needed moving to and from a bed to a chair (including a wheelchair)?: None Help needed standing up from a chair using your arms (e.g., wheelchair or bedside chair)?: None Help needed to walk in hospital room?: None Help needed climbing 3-5 steps with a railing? : None 6 Click Score: 24    End of Session Equipment Utilized During Treatment: Oxygen Activity Tolerance: Patient tolerated treatment well Patient left: in bed;with call  bell/phone within reach Nurse Communication: Mobility status;Other (comment) (O2 needs)      Time: 3382-5053 PT Time Calculation (min) (ACUTE ONLY): 10 min   Charges:   PT Evaluation $PT Eval Low Complexity: 1 Low          Moriyah Byington, PT, GCS 08/22/21,10:57 AM

## 2021-08-22 NOTE — Care Management Important Message (Signed)
Important Message  Patient Details  Name: [name] MRN: [id number] Date of Birth: 11-15-1960   Medicare Important Message Given:  Yes     Edsel Shives Montine Circle 08/22/2021, 3:34 PM

## 2021-08-22 NOTE — Discharge Summary (Signed)
Physician Discharge Summary  [name] AVW:[id number] DOB: [birth year] DOA: 08/15/2021  PCP: Bernerd Limbo, MD  Admit date: 08/15/2021 Discharge date: 08/22/2021  Admitted From: Home Disposition: Home  Recommendations for Outpatient Follow-up:  Follow up with PCP in 1 week  Outpatient follow-up with hemodialysis unit as scheduled Follow up in ED if symptoms worsen or new appear   Home Health: No Equipment/Devices: Oxygen via nasal cannula at 3 L/min Discharge Condition: Stable CODE STATUS: Full Diet recommendation: Heart healthy/renal hemodialysis diet  Brief/Interim Summary: 60 y.o. female with medical history significant of hypertension, hyperlipidemia, chronic diastolic CHF, CAD, aortic stenosis status post stent, COPD, tobacco use, PAD status post aortic stent/SMA stent/bilateral common iliac artery stent, CKD stage V, chronic pain, anxiety, depression, chronic mesenteric ischemia, history of GI bleed with recent admission in September 2022, chronic blood loss anemia presented with hematochezia and melena.  She was admitted for acute respiratory failure with hypoxia of with possible pneumonia and started on antibiotics along with GI bleeding with imaging suspicious for colitis for which Flagyl was also added.  GI was consulted.  EGD was unremarkable ; she had capsule endoscopy on 08/18/2021.  She had worsening AKI for which nephrology recommended transfer to [facility] for possible need for dialysis.  Status post HD catheter placement by IR on 08/21/2021 and initiation of hemodialysis on 08/21/2021.  Outpatient hemodialysis has been arranged.  Nephrology has cleared the patient for discharge home today.  She will be discharged home today with supplemental oxygen.  Discharge Diagnoses:   Acute on chronic blood loss anemia Probable acute GI bleeding Possible colitis -status post unremarkable EGD on 08/18/2021; capsule endoscopy showed no evidence of active bleeding nor  blood in the small bowel; colonic erosion versus polyp, but not bleeding; no blood seen in the colon. -Status post 1 unit packed red cell transfusion on 08/18/2021 for hemoglobin of 7.1.  Hemoglobin 9 on 08/21/2021.  Monitor.  Continue daily Protonix for now. -GI signed off on 08/19/2021: Flagyl discontinued on 08/19/2021 per GI recommendations as GI thinks that patient does not have colitis.  Outpatient follow-up with GI   Acute respiratory failure with hypoxia Possible community-acquired bacterial pneumonia -Completed 5-day course of Rocephin and Zithromax.  Blood cultures negative so far.  COVID 19/influenza PCR negative on presentation. -Oxygen saturations dropped to the 80s on room air on ambulation requiring supplemental oxygen via nasal cannula at 3 L/min.  She will need 3 L/min nasal cannula oxygen upon discharge.   AKI on CKD stage IV -Nephrology following.  Transferred to [facility] for possible dialysis catheter placement and starting HD. S/P HD catheter placement by IR today. -Creatinine 5.66 on 08/21/2021. -Outpatient hemodialysis has been arranged.  Nephrology has cleared the patient for discharge home today.  She will be discharged home today with supplemental oxygen.   HTN -Continue amlodipine, isosorbide mononitrate and labetalol   Chronic diastolic CHF -Currently looks euvolemic.  Continue diet and fluid restrictions.  Outpatient follow-up.  Hyperlipidemia -Continue statin and Zetia  Anxiety and depression -Continue venlafaxine   Generalized deconditioning -Tolerated PT well.     Discharge Instructions  Discharge Instructions     Diet - low sodium heart healthy   Complete by: As directed    Increase activity slowly   Complete by: As directed    No wound care   Complete by: As directed       Allergies as of 08/22/2021       Reactions   Doxycycline Anaphylaxis, Hives  Hydralazine Shortness Of Breath, Rash   Patient reports "couldn't breath"    Aspirin Other (See Comments)   Internal bleeding- MD SAID to not take this   Hydrocodone Nausea And Vomiting   Ibuprofen Other (See Comments)   Caused internal bleeding   Tylenol [acetaminophen] Other (See Comments)   MD told the patient to not take this   Iohexol Itching, Other (See Comments)   Pt has itching nose after iv contrast injection        Medication List     STOP taking these medications    calcitRIOL 0.5 MCG capsule Commonly known as: ROCALTROL   feeding supplement Liqd   oxyCODONE 5 MG immediate release tablet Commonly known as: Oxy IR/ROXICODONE       TAKE these medications    albuterol 108 (90 Base) MCG/ACT inhaler Commonly known as: VENTOLIN HFA Inhale 2 puffs into the lungs every 6 (six) hours as needed for wheezing or shortness of breath (asthma).   ALPRAZolam 1 MG tablet Commonly known as: XANAX Take 0.5-1 tablets by mouth See admin instructions. Take 1 mg by mouth at bedtime and an additional 0.5-1 mg once a day as needed for anxiety   amLODipine 10 MG tablet Commonly known as: NORVASC TAKE 1 TABLET BY MOUTH ONCE DAILY   atorvastatin 80 MG tablet Commonly known as: LIPITOR Take 80 mg by mouth at bedtime.   azelastine 0.1 % nasal spray Commonly known as: ASTELIN Place 1 spray into both nostrils daily as needed (seasonal allergies).   calcium acetate 667 MG capsule Commonly known as: PHOSLO Take 2,001 mg by mouth See admin instructions. Take 2001mg  (3 capsules) by mouth three times a day with meals and 667mg  (1 capsule) with a snack   ezetimibe 10 MG tablet Commonly known as: ZETIA TAKE 1 TABLET BY MOUTH ONCE A DAY   isosorbide mononitrate 60 MG 24 hr tablet Commonly known as: IMDUR Take 1 tablet (60 mg total) by mouth daily.   labetalol 100 MG tablet Commonly known as: NORMODYNE Take 100 mg by mouth 2 (two) times daily.   Linzess 72 MCG capsule Generic drug: linaclotide TAKE 1 CAPSULE BY MOUTH ONCE DAILY BEFORE BREAKFAST What  changed: See the new instructions.   megestrol 40 MG tablet Commonly known as: MEGACE Take 40 mg by mouth daily.   mometasone-formoterol 100-5 MCG/ACT Aero Commonly known as: DULERA Take 2 puffs first thing in am and then another 2 puffs about 12 hours later. What changed:  how much to take how to take this when to take this additional instructions   nitroGLYCERIN 0.4 MG SL tablet Commonly known as: NITROSTAT Place 1 tablet (0.4 mg total) under the tongue every 5 (five) minutes as needed for chest pain.   ondansetron 4 MG disintegrating tablet Commonly known as: ZOFRAN-ODT Take 1 tablet (4 mg total) by mouth every 8 (eight) hours as needed for nausea or vomiting.   oxyCODONE-acetaminophen 5-325 MG tablet Commonly known as: PERCOCET/ROXICET Take 1 tablet by mouth every 6 (six) hours as needed for severe pain.   pantoprazole 40 MG tablet Commonly known as: PROTONIX Take 1 tablet (40 mg total) by mouth daily. What changed: See the new instructions.   promethazine 25 MG tablet Commonly known as: PHENERGAN Take 25 mg by mouth 2 (two) times daily as needed for nausea or vomiting.   venlafaxine XR 150 MG 24 hr capsule Commonly known as: Effexor XR Take 2 capsules (300 mg total) by mouth daily with breakfast.  Durable Medical Equipment  (From admission, onward)           Start     Ordered   08/22/21 1121  For home use only DME oxygen  Once       Question Answer Comment  Length of Need 6 Months   Mode or (Route) Nasal cannula   Liters per Minute 3   Frequency Continuous (stationary and portable oxygen unit needed)   Oxygen conserving device Yes   Oxygen delivery system Gas      08/22/21 1120              Follow-up Information     [facility] .   Specialty: Radiology Contact information: [address] [id number] [place] [address] [phone]        [name].   Why: Schedule is Monday,Wednesday, Friday Pt will need to arrive at 11:15 for 11:35 chair time. For first appointment, pt will need to arrive at 10:40 to complete paperwork prior to treatment. Contact information: [place] [address] [phone]         Bernerd Limbo, MD. Schedule an appointment as soon as possible for a visit in 1 week(s).   Specialty: Family Medicine Contact information: [address] [place] [address] [phone]                Allergies  Allergen Reactions   Doxycycline Anaphylaxis and Hives   Hydralazine Shortness Of Breath and Rash    Patient reports "couldn't breath"   Aspirin Other (See Comments)    Internal bleeding- MD SAID to not take this   Hydrocodone Nausea And Vomiting   Ibuprofen Other (See Comments)    Caused internal bleeding   Tylenol [Acetaminophen] Other (See Comments)    MD told the patient to not take this   Iohexol Itching and Other (See Comments)    Pt has itching nose after iv contrast injection    Consultations: Nephrology/GI/IR   Procedures/Studies: CT ABDOMEN PELVIS WO CONTRAST  Result Date: 08/16/2021 CLINICAL DATA:  Acute abdominal pain with rectal bleeding, initial encounter EXAM: CT ABDOMEN AND PELVIS WITHOUT CONTRAST TECHNIQUE: Multidetector CT imaging of the abdomen and pelvis was performed following the standard protocol without IV contrast. COMPARISON:  07/25/2021 FINDINGS: Lower chest: Mild atelectatic changes are noted in the left lung base. Hepatobiliary: No focal liver abnormality is seen. No gallstones, gallbladder wall thickening, or biliary dilatation. Pancreas: Unremarkable. No pancreatic ductal dilatation or surrounding inflammatory changes. Spleen: Normal in size without focal abnormality. Adrenals/Urinary Tract: Adrenal glands are within normal limits. No renal calculi or obstructive changes are seen. The bladder is partially  distended. Stomach/Bowel: Mild wall thickening is noted within the colon which may be related to incomplete distension although the possibility of mild colitis could not be totally excluded given the patient's clinical history. Changes of prior appendectomy are seen. Small bowel and stomach appear within normal limits. Vascular/Lymphatic: Diffuse atherosclerotic calcifications are noted. Kissing iliac stents are seen. No significant lymphadenopathy is noted. Reproductive: Status post hysterectomy. No adnexal masses. Other: No abdominal wall hernia or abnormality. No abdominopelvic ascites. Musculoskeletal: No acute or significant osseous findings. IMPRESSION: Mild wall thickening within the colon as described. This may represent some mild colitis. No other focal abnormality is noted. Electronically Signed   By: Inez Catalina M.D.   On: 08/16/2021 01:23   CT ABDOMEN PELVIS WO CONTRAST  Result Date: 07/25/2021 CLINICAL DATA:  Abdominal pain EXAM: CT ABDOMEN AND PELVIS WITHOUT CONTRAST TECHNIQUE: Multidetector CT imaging of the abdomen and pelvis was performed following the standard protocol without IV contrast. COMPARISON:  CT abdomen and pelvis 06/10/2021 FINDINGS: Lower chest: There is of compressive atelectasis visualized in the medial aspect of the right middle lobe and lower lobe, new since previous study. Cardiomegaly. Hepatobiliary: No focal liver abnormality is seen. No gallstones, gallbladder wall thickening, or biliary dilatation. Pancreas: Unremarkable. No pancreatic ductal dilatation or surrounding inflammatory changes. Spleen: Normal in size without focal abnormality. Adrenals/Urinary Tract: Adrenal glands are within normal limits. Stable chronic left renal atrophy. No nephrolithiasis or hydronephrosis identified bilaterally. Urinary bladder appears unremarkable. Stomach/Bowel: No bowel obstruction, free air or pneumatosis. Appears to be wall thickening of the descending and sigmoid colon, limited  evaluation due to nondistention. Appendix not visualized. Vascular/Lymphatic: Severe calcified atherosclerotic disease. No bulky lymphadenopathy identified. Reproductive: Status post hysterectomy. No adnexal masses. Other: Stable small amount of free fluid in the dependent pelvis. Musculoskeletal: No acute or significant osseous findings. IMPRESSION: 1. Apparent wall thickening of the descending and sigmoid colon although limited evaluation due to nondistention. Correlate for possible colitis. 2. Chronic left renal atrophy. 3. Stable small amount of free fluid in the dependent pelvis. 4. Compressive atelectatic changes in the medial right middle and lower lobe, new since previous study. 5. Severe atherosclerotic disease noted. Electronically Signed   By: Ofilia Neas   On: 07/25/2021 12:34   DG Chest 2 View  Result Date: 08/18/2021 CLINICAL DATA:  Shortness of breath, abdominal pain EXAM: CHEST - 2 VIEW COMPARISON:  Radiograph 08/16/2021 FINDINGS: The unchanged cardiomediastinal silhouette. There are new small bilateral pleural effusions with adjacent basilar opacities. No visible pneumothorax. No acute osseous abnormality. IMPRESSION: New small bilateral pleural effusions with adjacent bibasilar opacities favored to be atelectasis. Electronically Signed   By: Maurine Simmering M.D.   On: 08/18/2021 09:30   IR Fluoro Guide CV Line Right  Result Date: 08/21/2021 INDICATION: Initiation of dialysis for chronic kidney disease stage 5 EXAM: Ultrasound fluoroscopy guided tunneled dialysis catheter placement MEDICATIONS: Ancef 2 g IV; The antibiotic was administered within an appropriate time interval prior to skin puncture. ANESTHESIA/SEDATION: Moderate (conscious) sedation was employed during this procedure. A total of Versed 1 mg and Fentanyl 25 mcg was administered intravenously by the radiology nurse. Total intra-service moderate Sedation Time: 9 minutes. [name] level of consciousness and vital signs  were monitored continuously by radiology nursing throughout the procedure under my direct supervision. FLUOROSCOPY TIME:  Fluoroscopy Time: 0 minutes 12 seconds (1 mGy). COMPLICATIONS: None immediate. PROCEDURE: Informed written consent was obtained from the patient after a thorough discussion of the procedural risks, benefits and alternatives. All questions were addressed. Maximal Sterile Barrier Technique was utilized including caps, mask, sterile gowns, sterile gloves, sterile drape, hand hygiene and skin antiseptic. A timeout was performed prior to the initiation of the procedure. The right internal jugular vein was evaluated with ultrasound and shown to be patent. A permanent ultrasound image was obtained and placed in the patient's medical record. Using sterile gel and a sterile probe cover, the right internal jugular vein was entered with a 21 ga needle during real time ultrasound guidance. 0.018 inch guidewire placed and 21 ga needle exchanged for transitional dilator set. Utilizing fluoroscopy, 0.035 inch guidewire advanced through the needle without difficulty. Seriel dilation was performed and peel-away sheath was placed. Attention then turned to the right anterior upper chest. Following local lidocaine administration, the hemodialysis catheter was  tunneled from the chest wall to the venotomy site. The catheter was inserted through the peel-away sheath. The tip of the catheter was positioned within the right atrium using fluoroscopic guidance. All lumens of the catheter aspirated and flushed well. The dialysis lumens were locked with Heparin. The catheter was secured to the skin with suture. The insertion site was covered with sterile dressing. IMPRESSION: 19 cm tunneled right IJ hemodialysis catheter is ready for use. Electronically Signed   By: Miachel Roux M.D.   On: 08/21/2021 13:01   IR US Guide Vasc Access Right  Result Date: 08/21/2021 INDICATION: Initiation of dialysis for chronic kidney  disease stage 5 EXAM: Ultrasound fluoroscopy guided tunneled dialysis catheter placement MEDICATIONS: Ancef 2 g IV; The antibiotic was administered within an appropriate time interval prior to skin puncture. ANESTHESIA/SEDATION: Moderate (conscious) sedation was employed during this procedure. A total of Versed 1 mg and Fentanyl 25 mcg was administered intravenously by the radiology nurse. Total intra-service moderate Sedation Time: 9 minutes. [name] level of consciousness and vital signs were monitored continuously by radiology nursing throughout the procedure under my direct supervision. FLUOROSCOPY TIME:  Fluoroscopy Time: 0 minutes 12 seconds (1 mGy). COMPLICATIONS: None immediate. PROCEDURE: Informed written consent was obtained from the patient after a thorough discussion of the procedural risks, benefits and alternatives. All questions were addressed. Maximal Sterile Barrier Technique was utilized including caps, mask, sterile gowns, sterile gloves, sterile drape, hand hygiene and skin antiseptic. A timeout was performed prior to the initiation of the procedure. The right internal jugular vein was evaluated with ultrasound and shown to be patent. A permanent ultrasound image was obtained and placed in the patient's medical record. Using sterile gel and a sterile probe cover, the right internal jugular vein was entered with a 21 ga needle during real time ultrasound guidance. 0.018 inch guidewire placed and 21 ga needle exchanged for transitional dilator set. Utilizing fluoroscopy, 0.035 inch guidewire advanced through the needle without difficulty. Seriel dilation was performed and peel-away sheath was placed. Attention then turned to the right anterior upper chest. Following local lidocaine administration, the hemodialysis catheter was tunneled from the chest wall to the venotomy site. The catheter was inserted through the peel-away sheath. The tip of the catheter was positioned within the right  atrium using fluoroscopic guidance. All lumens of the catheter aspirated and flushed well. The dialysis lumens were locked with Heparin. The catheter was secured to the skin with suture. The insertion site was covered with sterile dressing. IMPRESSION: 19 cm tunneled right IJ hemodialysis catheter is ready for use. Electronically Signed   By: Miachel Roux M.D.   On: 08/21/2021 13:01   DG Chest Port 1 View  Result Date: 08/16/2021 CLINICAL DATA:  Chest pain. EXAM: PORTABLE CHEST 1 VIEW COMPARISON:  August 15, 2021. FINDINGS: Stable cardiomegaly. No pneumothorax or pleural effusion is noted. Left lung is clear. Stable right infrahilar opacity is noted concerning for atelectasis or infiltrate. Bony thorax is unremarkable. IMPRESSION: Stable right infrahilar opacity is noted concerning for atelectasis or possibly infiltrate. Electronically Signed   By: Marijo Conception M.D.   On: 08/16/2021 16:12   DG Chest Portable 1 View  Result Date: 08/15/2021 CLINICAL DATA:  Cough and fever EXAM: PORTABLE CHEST 1 VIEW COMPARISON:  07/25/2021 FINDINGS: Increased opacity in the parahilar right lung. No pleural effusion or pneumothorax. Lungs are hyperinflated. IMPRESSION: Increased opacity in the parahilar right lung, possibly infection. Electronically Signed   By: Cletus Gash.D.  On: 08/15/2021 23:44   DG Chest Port 1 View  Result Date: 07/25/2021 CLINICAL DATA:  Chest pain EXAM: PORTABLE CHEST 1 VIEW COMPARISON:  05/08/2021 FINDINGS: Stable chronic hyperinflation. Heart and mediastinal contours are within normal limits. No focal opacities or effusions. No acute bony abnormality. Old right clavicle fracture. IMPRESSION: No active cardiopulmonary disease. Electronically Signed   By: Rolm Baptise M.D.   On: 07/25/2021 20:48   VAS [place] UPPER EXTREMITY ARTERIAL DUPLEX  Result Date: 08/07/2021  UPPER EXTREMITY DUPLEX STUDY Patient Name:  ERIYANNA KOFOED  Date of Exam:   08/07/2021 Medical Rec #: [id number]          Accession #:    [id number] Date of Birth: 23-Dec-1960         Patient Gender: F Patient Age:   82 years Exam Location:  [facility] Procedure:      VAS [place] UPPER EXTREMITY ARTERIAL DUPLEX Referring Phys: Jamelle Haring --------------------------------------------------------------------------------  Indications: New access. History:     Patient has a history of CKD 4.  Performing Technologist: Ronal Fear RVS, RCS  Examination Guidelines: A complete evaluation includes B-mode imaging, spectral Doppler, color Doppler, and power Doppler as needed of all accessible portions of each vessel. Bilateral testing is considered an integral part of a complete examination. Limited examinations for reoccurring indications may be performed as noted.  Right Pre-Dialysis Findings: +-----------------------+----------+--------------------+---------+--------+ Location               PSV (cm/s)Intralum. Diam. (cm)Waveform Comments +-----------------------+----------+--------------------+---------+--------+ Brachial Antecub. fossa103       0.51                triphasic         +-----------------------+----------+--------------------+---------+--------+ Radial Art at Wrist    84        0.24                triphasic         +-----------------------+----------+--------------------+---------+--------+ Ulnar Art at Wrist     87        0.34                triphasic         +-----------------------+----------+--------------------+---------+--------+  Left Pre-Dialysis Findings: +------------------+----------+-------------------+---------+------------------+ Location          PSV (cm/s)Intralum. Diam.    Waveform Comments                                       (cm)                                           +------------------+----------+-------------------+---------+------------------+ Brachial Antecub. 120       0.47               triphasic                   fossa                                                                       +------------------+----------+-------------------+---------+------------------+ Radial Art  at     99        0.30               triphasictrotuous in upper  Wrist                                                   forearm            +------------------+----------+-------------------+---------+------------------+ Ulnar Art at Wrist85        0.27               triphasic                   +------------------+----------+-------------------+---------+------------------+  Summary:  Right: No obstruction visualized in the right upper extremity. Left: No obstruction visualized in the left upper extremity. *See table(s) above for measurements and observations. Electronically signed by Harold Barban MD on 08/07/2021 at 6:14:39 PM.    Final    VAS [place] UPPER EXTREMITY VEIN MAPPING  Result Date: 08/07/2021 UPPER EXTREMITY VEIN MAPPING Patient Name:  [name]  Date of Exam:   08/07/2021 Medical Rec #: [id number]         Accession #:    [id number] Date of Birth: 10-31-1960         Patient Gender: F Patient Age:   70 years Exam Location:  [facility] Procedure:      VAS [place] UPPER EXT VEIN MAPPING (PRE-OP AVF) Referring Phys: Harold Barban --------------------------------------------------------------------------------  Indications: Pre-access. History: CKD 4.  Performing Technologist: Ronal Fear RVS, RCS  Examination Guidelines: A complete evaluation includes B-mode imaging, spectral Doppler, color Doppler, and power Doppler as needed of all accessible portions of each vessel. Bilateral testing is considered an integral part of a complete examination. Limited examinations for reoccurring indications may be performed as noted. +-----------------+-------------+----------+--------+ Right Cephalic   Diameter (cm)Depth (cm)Findings +-----------------+-------------+----------+--------+ Shoulder             0.24                         +-----------------+-------------+----------+--------+ Prox upper arm       0.23                        +-----------------+-------------+----------+--------+ Mid upper arm        0.23                        +-----------------+-------------+----------+--------+ Dist upper arm       0.29                        +-----------------+-------------+----------+--------+ Antecubital fossa    0.37                        +-----------------+-------------+----------+--------+ Prox forearm         0.11                        +-----------------+-------------+----------+--------+ Mid forearm          0.16                        +-----------------+-------------+----------+--------+ Dist forearm  0.25                        +-----------------+-------------+----------+--------+ +-----------------+-------------+----------+--------+ Right Basilic    Diameter (cm)Depth (cm)Findings +-----------------+-------------+----------+--------+ Prox upper arm       0.24                        +-----------------+-------------+----------+--------+ Mid upper arm        0.37                        +-----------------+-------------+----------+--------+ Dist upper arm       0.40                        +-----------------+-------------+----------+--------+ Antecubital fossa    0.42                        +-----------------+-------------+----------+--------+ Prox forearm         0.35                        +-----------------+-------------+----------+--------+ +-----------------+-------------+----------+--------------+ Left Cephalic    Diameter (cm)Depth (cm)   Findings    +-----------------+-------------+----------+--------------+ Shoulder             0.13                              +-----------------+-------------+----------+--------------+ Prox upper arm       0.16                              +-----------------+-------------+----------+--------------+ Mid upper arm         0.11                              +-----------------+-------------+----------+--------------+ Dist upper arm       0.16                              +-----------------+-------------+----------+--------------+ Antecubital fossa    0.11                              +-----------------+-------------+----------+--------------+ Prox forearm                            not visualized +-----------------+-------------+----------+--------------+ Mid forearm          0.06                              +-----------------+-------------+----------+--------------+ Dist forearm                            not visualized +-----------------+-------------+----------+--------------+ +-----------------+-------------+----------+--------+ Left Basilic     Diameter (cm)Depth (cm)Findings +-----------------+-------------+----------+--------+ Prox upper arm       0.22                        +-----------------+-------------+----------+--------+ Mid upper arm        0.23                        +-----------------+-------------+----------+--------+  Dist upper arm       0.25                        +-----------------+-------------+----------+--------+ Antecubital fossa    0.23                        +-----------------+-------------+----------+--------+ Prox forearm         0.16                        +-----------------+-------------+----------+--------+ Summary: Right: Patent and compressible cephalic and basilic veins. Left: Patent and compressible cephalic and basilic veins. *See table(s) above for measurements and observations.  Diagnosing physician: Harold Barban MD Electronically signed by Harold Barban MD on 08/07/2021 at 6:15:45 PM.    Final     EGD on 08/18/2021.  Capsule endoscopy on 08/18/2021  Subjective: Patient seen and examined at bedside.  Feels slightly better.  She denies worsening fever, nausea, vomiting or shortness of breath.    Discharge Exam: Vitals:    08/22/21 1047 08/22/21 1054  BP:  (!) 167/85  Pulse:  76  Resp:  19  Temp:  98.3 F (36.8 C)  SpO2: (!) 85% 94%    General exam: On room air currently.  No distress.  Looks chronically ill.   Respiratory system: Bilateral decreased breath sounds at bases with some scattered crackles  cardiovascular system: S1-S2 heard; rate controlled  gastrointestinal system: Abdomen is mildly distended; soft and nontender.  Bowel sounds are heard  extremities: No clubbing; trace lower extremity edema present    The results of significant diagnostics from this hospitalization (including imaging, microbiology, ancillary and laboratory) are listed below for reference.     Microbiology: Recent Results (from the past 240 hour(s))  Resp Panel by RT-PCR (Flu A&B, Covid) Nasopharyngeal Swab     Status: None   Collection Time: 08/16/21  3:51 AM   Specimen: Nasopharyngeal Swab; Nasopharyngeal(NP) swabs in vial transport medium  Result Value Ref Range Status   SARS Coronavirus 2 by RT PCR NEGATIVE NEGATIVE Final    Comment: (NOTE) SARS-CoV-2 target nucleic acids are NOT DETECTED.  The SARS-CoV-2 RNA is generally detectable in upper respiratory specimens during the acute phase of infection. The lowest concentration of SARS-CoV-2 viral copies this assay can detect is 138 copies/mL. A negative result does not preclude SARS-Cov-2 infection and should not be used as the sole basis for treatment or other patient management decisions. A negative result may occur with  improper specimen collection/handling, submission of specimen other than nasopharyngeal swab, presence of viral mutation(s) within the areas targeted by this assay, and inadequate number of viral copies(<138 copies/mL). A negative result must be combined with clinical observations, patient history, and epidemiological information. The expected result is Negative.  Fact Sheet for Patients:  EntrepreneurPulse.com.au  Fact  Sheet for Healthcare Providers:  IncredibleEmployment.be  This test is no t yet approved or cleared by the [place] FDA and  has been authorized for detection and/or diagnosis of SARS-CoV-2 by FDA under an Emergency Use Authorization (EUA). This EUA will remain  in effect (meaning this test can be used) for the duration of the COVID-19 declaration under Section 564(b)(1) of the Act, 21 U.S.C.section 360bbb-3(b)(1), unless the authorization is terminated  or revoked sooner.       Influenza A by PCR NEGATIVE NEGATIVE Final   Influenza B by PCR NEGATIVE NEGATIVE Final    Comment: (NOTE)  The Xpert Xpress SARS-CoV-2/FLU/RSV plus assay is intended as an aid in the diagnosis of influenza from Nasopharyngeal swab specimens and should not be used as a sole basis for treatment. Nasal washings and aspirates are unacceptable for Xpert Xpress SARS-CoV-2/FLU/RSV testing.  Fact Sheet for Patients: EntrepreneurPulse.com.au  Fact Sheet for Healthcare Providers: IncredibleEmployment.be  This test is not yet approved or cleared by the [place] FDA and has been authorized for detection and/or diagnosis of SARS-CoV-2 by FDA under an Emergency Use Authorization (EUA). This EUA will remain in effect (meaning this test can be used) for the duration of the COVID-19 declaration under Section 564(b)(1) of the Act, 21 U.S.C. section 360bbb-3(b)(1), unless the authorization is terminated or revoked.  Performed at [facility], [place] [address] [place], [place] [address]   Culture, blood (routine x 2)     Status: None   Collection Time: 08/16/21  7:15 AM   Specimen: BLOOD  Result Value Ref Range Status   Specimen Description   Final    BLOOD LEFT ANTECUBITAL Performed at [facility] [address], [place], [place] [address]    Special Requests   Final    BOTTLES DRAWN AEROBIC AND ANAEROBIC Blood  Culture results may not be optimal due to an inadequate volume of blood received in culture bottles Performed at [facility] [address] [place], [place] [address]    Culture   Final    NO GROWTH 5 DAYS Performed at [facility], [place] [address] [place], [place] [address]    Report Status 08/21/2021 FINAL  Final  Culture, blood (routine x 2)     Status: None   Collection Time: 08/16/21  7:40 AM   Specimen: BLOOD  Result Value Ref Range Status   Specimen Description   Final    BLOOD LEFT ANTECUBITAL Performed at [facility] [address] [place], [place] [address]    Special Requests   Final    BOTTLES DRAWN AEROBIC AND ANAEROBIC Blood Culture results may not be optimal due to an inadequate volume of blood received in culture bottles Performed at [facility] [address], [place], [place] [address]    Culture   Final    NO GROWTH 5 DAYS Performed at [facility], [place] [address] [place], [place] [address]    Report Status 08/21/2021 FINAL  Final     Labs: BNP (last 3 results) Recent Labs    08/16/21 0740  BNP 962.9*   Basic Metabolic Panel: Recent Labs  Lab 08/17/21 0453 08/18/21 0540 08/19/21 0307 08/20/21 0414 08/21/21 0259  NA 131* 132* 134* 135 136  K 3.6 3.7 3.9 4.1 3.9  CL 102 103 104 105 107  CO2 19* 21* 21* 19* 20*  GLUCOSE 94 70 83 79 77  BUN 44* 48* 47* 43* 37*  CREATININE 5.25* 5.69* 5.94* 5.86* 5.66*  CALCIUM 7.9* 7.6* 7.8* 7.8* 8.1*   Liver Function Tests: No results for input(s): AST, ALT, ALKPHOS, BILITOT, PROT, ALBUMIN in the last 168 hours. No results for input(s): LIPASE, AMYLASE in the last 168 hours. No results for input(s): AMMONIA in the last 168 hours. CBC: Recent Labs  Lab 08/15/21 1551 08/15/21 2358 08/17/21 0453 08/18/21 0540 08/18/21 1600 08/19/21 0307 08/20/21 0414 08/21/21 0259  WBC 8.6  --  11.3* 7.8  --  8.0 7.8 7.5  NEUTROABS 6.8  --   --    --   --   --   --   --  HGB 9.4*   < > 8.1* 7.1* 9.3* 8.5* 8.9* 9.0*  HCT 27.4*   < > 23.7* 21.3* 27.5* 24.6* 26.5* 26.7*  MCV 89.3  --  89.1 91.4  --  89.5 91.4 90.5  PLT 433*  --  360 318  --  334 388 381   < > = values in this interval not displayed.   Cardiac Enzymes: No results for input(s): CKTOTAL, CKMB, CKMBINDEX, TROPONINI in the last 168 hours. BNP: Invalid input(s): POCBNP CBG: No results for input(s): GLUCAP in the last 168 hours. D-Dimer No results for input(s): DDIMER in the last 72 hours. Hgb A1c No results for input(s): HGBA1C in the last 72 hours. Lipid Profile No results for input(s): CHOL, HDL, LDLCALC, TRIG, CHOLHDL, LDLDIRECT in the last 72 hours. Thyroid function studies No results for input(s): TSH, T4TOTAL, T3FREE, THYROIDAB in the last 72 hours.  Invalid input(s): FREET3 Anemia work up No results for input(s): VITAMINB12, FOLATE, FERRITIN, TIBC, IRON, RETICCTPCT in the last 72 hours. Urinalysis    Component Value Date/Time   COLORURINE YELLOW 08/20/2021 0140   APPEARANCEUR CLEAR 08/20/2021 0140   APPEARANCEUR Cloudy (A) 03/22/2021 1432   LABSPEC 1.012 08/20/2021 0140   LABSPEC 1.016 06/07/2012 1350   PHURINE 5.0 08/20/2021 0140   GLUCOSEU 50 (A) 08/20/2021 0140   GLUCOSEU Negative 06/07/2012 1350   HGBUR SMALL (A) 08/20/2021 0140   BILIRUBINUR NEGATIVE 08/20/2021 0140   BILIRUBINUR Negative 03/22/2021 1432   BILIRUBINUR Negative 06/07/2012 1350   KETONESUR NEGATIVE 08/20/2021 0140   PROTEINUR 100 (A) 08/20/2021 0140   UROBILINOGEN 0.2 06/28/2014 1518   NITRITE NEGATIVE 08/20/2021 0140   LEUKOCYTESUR NEGATIVE 08/20/2021 0140   LEUKOCYTESUR Negative 06/07/2012 1350   Sepsis Labs Invalid input(s): PROCALCITONIN,  WBC,  LACTICIDVEN Microbiology Recent Results (from the past 240 hour(s))  Resp Panel by RT-PCR (Flu A&B, Covid) Nasopharyngeal Swab     Status: None   Collection Time: 08/16/21  3:51 AM   Specimen: Nasopharyngeal Swab;  Nasopharyngeal(NP) swabs in vial transport medium  Result Value Ref Range Status   SARS Coronavirus 2 by RT PCR NEGATIVE NEGATIVE Final    Comment: (NOTE) SARS-CoV-2 target nucleic acids are NOT DETECTED.  The SARS-CoV-2 RNA is generally detectable in upper respiratory specimens during the acute phase of infection. The lowest concentration of SARS-CoV-2 viral copies this assay can detect is 138 copies/mL. A negative result does not preclude SARS-Cov-2 infection and should not be used as the sole basis for treatment or other patient management decisions. A negative result may occur with  improper specimen collection/handling, submission of specimen other than nasopharyngeal swab, presence of viral mutation(s) within the areas targeted by this assay, and inadequate number of viral copies(<138 copies/mL). A negative result must be combined with clinical observations, patient history, and epidemiological information. The expected result is Negative.  Fact Sheet for Patients:  EntrepreneurPulse.com.au  Fact Sheet for Healthcare Providers:  IncredibleEmployment.be  This test is no t yet approved or cleared by the [place] FDA and  has been authorized for detection and/or diagnosis of SARS-CoV-2 by FDA under an Emergency Use Authorization (EUA). This EUA will remain  in effect (meaning this test can be used) for the duration of the COVID-19 declaration under Section 564(b)(1) of the Act, 21 U.S.C.section 360bbb-3(b)(1), unless the authorization is terminated  or revoked sooner.       Influenza A by PCR NEGATIVE NEGATIVE Final   Influenza B by PCR NEGATIVE NEGATIVE Final    Comment: (NOTE)  The Xpert Xpress SARS-CoV-2/FLU/RSV plus assay is intended as an aid in the diagnosis of influenza from Nasopharyngeal swab specimens and should not be used as a sole basis for treatment. Nasal washings and aspirates are unacceptable for Xpert Xpress  SARS-CoV-2/FLU/RSV testing.  Fact Sheet for Patients: EntrepreneurPulse.com.au  Fact Sheet for Healthcare Providers: IncredibleEmployment.be  This test is not yet approved or cleared by the [place] FDA and has been authorized for detection and/or diagnosis of SARS-CoV-2 by FDA under an Emergency Use Authorization (EUA). This EUA will remain in effect (meaning this test can be used) for the duration of the COVID-19 declaration under Section 564(b)(1) of the Act, 21 U.S.C. section 360bbb-3(b)(1), unless the authorization is terminated or revoked.  Performed at [facility], [place] [address], [place], [place] [address]   Culture, blood (routine x 2)     Status: None   Collection Time: 08/16/21  7:15 AM   Specimen: BLOOD  Result Value Ref Range Status   Specimen Description   Final    BLOOD LEFT ANTECUBITAL Performed at [facility] [address] [place], [place] [address]    Special Requests   Final    BOTTLES DRAWN AEROBIC AND ANAEROBIC Blood Culture results may not be optimal due to an inadequate volume of blood received in culture bottles Performed at [facility] [address] [place], [place] [address]    Culture   Final    NO GROWTH 5 DAYS Performed at [facility], [place] [address], [place], [place] [address]    Report Status 08/21/2021 FINAL  Final  Culture, blood (routine x 2)     Status: None   Collection Time: 08/16/21  7:40 AM   Specimen: BLOOD  Result Value Ref Range Status   Specimen Description   Final    BLOOD LEFT ANTECUBITAL Performed at [facility] [address], [place], [place] [address]    Special Requests   Final    BOTTLES DRAWN AEROBIC AND ANAEROBIC Blood Culture results may not be optimal due to an inadequate volume of blood received in culture bottles Performed at [facility] [address],  [place], [place] [address]    Culture   Final    NO GROWTH 5 DAYS Performed at [facility], [place] [address], [place], [place] [address]    Report Status 08/21/2021 FINAL  Final     Time coordinating discharge: 35 minutes  SIGNED:   Aline August, MD  [facility] 08/22/2021, 11:24 AM

## 2021-08-22 NOTE — Progress Notes (Signed)
Pt to d/c to home today. Spoke to Renee, clinic manager, at [facility] regarding pt's d/c today and to confirm pt can start tomorrow. Clinic prepared to start care tomorrow. Renee confirms clinicals received that were faxed yesterday. Renal PA aware of pt's d/c today and to provide orders to clinic. Met with pt at bedside to remind pt that she needs to arrive at clinic tomorrow at 10:40 to complete paperwork prior to treatment. Also reminded pt that she will have treatment on Saturday due to holiday. Pt advised schedule will resume to regular schedule on Monday. Pt voices understanding of the above. Information documented on AVS as well.      Renal Navigator [phone] 

## 2021-09-08 ENCOUNTER — Other Ambulatory Visit: Payer: Self-pay

## 2021-09-08 NOTE — Telephone Encounter (Signed)
Received request from Dr. Harrie Jeans for AVG placement.   Spoke with patient who reports her previous symptoms have improved and no bleeding noted. She is agreeable to reschedule surgery for Dec 15. Instructions reviewed. Pt verbalized understanding.

## 2021-09-13 ENCOUNTER — Encounter (HOSPITAL_COMMUNITY): Payer: Self-pay | Admitting: Vascular Surgery

## 2021-09-13 NOTE — Progress Notes (Signed)
DUE TO COVID-19 ONLY ONE VISITOR IS ALLOWED TO COME WITH YOU AND STAY IN THE WAITING ROOM ONLY DURING PRE OP AND PROCEDURE DAY OF SURGERY.   PCP - Dr Bernerd Limbo Cardiologist - Dr Adrian Prows Nephrology - Dr Royce Macadamia  Chest x-ray - 08/18/21 (2V) EKG - 08/16/21 Stress Test - 05/22/21 ECHO - 10/16/19 Cardiac Cath - 05/09/21  ICD Pacemaker/Loop - n/a  Sleep Study -  n/a CPAP - none  Anesthesia review: Yes  STOP now taking any Aspirin (unless otherwise instructed by your surgeon), Aleve, Naproxen, Ibuprofen, Motrin, Advil, Goody's, BC's, all herbal medications, fish oil, and all vitamins.   Coronavirus Screening Covid test n/a Ambulatory Surgery  Do you have any of the following symptoms:  Cough yes/no: No Fever (>100.74F)  yes/no: No Runny nose yes/no: No Sore throat yes/no: No Difficulty breathing/shortness of breath  yes/no: No  Have you traveled in the last 14 days and where? yes/no: No  Patient verbalized understanding of instructions that were given via phone.

## 2021-09-13 NOTE — Anesthesia Preprocedure Evaluation (Addendum)
Anesthesia Evaluation  Patient identified by MRN, date of birth, ID band Patient awake    Reviewed: Allergy & Precautions, NPO status , Patient's Chart, lab work & pertinent test results  Airway Mallampati: I  TM Distance: >3 FB Neck ROM: Full    Dental  (+) Edentulous Upper, Edentulous Lower   Pulmonary asthma , COPD, Current Smoker and Patient abstained from smoking., former smoker,     + decreased breath sounds      Cardiovascular hypertension, + CAD, + Peripheral Vascular Disease and +CHF   Rhythm:Regular Rate:Normal     Neuro/Psych  Headaches, PSYCHIATRIC DISORDERS Anxiety Depression Bipolar Disorder Schizophrenia  Neuromuscular disease    GI/Hepatic Neg liver ROS, GERD  ,  Endo/Other  negative endocrine ROS  Renal/GU Renal disease     Musculoskeletal  (+) Arthritis ,   Abdominal Normal abdominal exam  (+)   Peds  Hematology   Anesthesia Other Findings   Reproductive/Obstetrics                           Anesthesia Physical Anesthesia Plan  ASA: 3  Anesthesia Plan: MAC   Post-op Pain Management:    Induction: Intravenous  PONV Risk Score and Plan: 3 and Ondansetron, Dexamethasone and Midazolam  Airway Management Planned: Natural Airway and Simple Face Mask  Additional Equipment: None  Intra-op Plan:   Post-operative Plan:   Informed Consent: I have reviewed the patients History and Physical, chart, labs and discussed the procedure including the risks, benefits and alternatives for the proposed anesthesia with the patient or authorized representative who has indicated his/her understanding and acceptance.       Plan Discussed with: CRNA  Anesthesia Plan Comments: (PAT note written 09/13/2021 by Myra Gianotti, PA-C. )      Anesthesia Quick Evaluation

## 2021-09-13 NOTE — Progress Notes (Signed)
Anesthesia Chart Review: SAME DAY WORK-UP  Case: [id number] Date/Time: 09/14/21 1055   Procedure: INSERTION OF ARTERIOVENOUS (AV) GORE-TEX GRAFT ARM (Left)   Anesthesia type: Choice   Pre-op diagnosis: ESRD   Location: [facility] OR ROOM 11 / [place] OR   Surgeons: Cherre Robins, MD       DISCUSSION: See my Anesthesia APP note signed on 08/15/21. Procedure was initially planned for 08/16/21, but she had recurrent GI/rectal bleeding (acute on chronic blood loss anemia). See below.  Since my last note: [facility] admission 08/15/21-08/22/21 for recurrent rectal bleeding. Also noted to have oxygen desaturations due to possible CAP and/or COPD exacerbation in setting of progressive kidney disease. COVID and flu tests negative. She was put on 2-3L O2/[place] and given Duonebs and antibiotics. Received 1 unit PRBC. GI consulted. Small bowel enteroscopy on 08/18/21 showed normal esophagus, stomach, duodenum, and proximal jejunum. Video capsule enteroscope advanced into the duodenal bulb and showed normal small bowel with colonic erosion versus polyp that was erythematous, but non-bleeding. Daily PPI recommended, no need for antibiotics per GI. Nephrology consulted for worsening CKD V. Right IJ tunneled HD catheter placed by IR on 08/21/21 and started on hemodialysis.  Outpatient hemodialysis arranged.  She was discharged home with supplemental home O2.  Nephrology re-contacted [facility] to reschedule placed [place]. As of 09/08/21, patient reported previous symptoms improved and not bleeding noted.     VS:  BP Readings from Last 3 Encounters:  08/22/21 (!) 167/85  08/08/21 (!) 141/76  08/01/21 (!) 126/58   Pulse Readings from Last 3 Encounters:  08/22/21 76  08/08/21 74  08/01/21 88     LABS: For day of surgery. As of 08/21/21, H/H 9.0/26.7.    IMAGES: CXR 08/18/21:  IMPRESSION: New small bilateral pleural effusions with adjacent bibasilar opacities favored to be atelectasis.    EKG: 08/16/21: Ectopic atrial  rhythm Ventricular premature complex Short PR interval Left ventricular hypertrophy Anterior infarct, old No significant change since last tracing Confirmed by Dorie Rank [phone]) on 08/17/2021 7:12:00 PM   CV: Lexiscan Tetrofosmin stress test 05/22/2021: Carlton Adam nuclear stress test performed using 1-day protocol. Patient was hypertensive throughout the study up to 200/100 mmHg. Stress EKG is non-diagnostic, as this is pharmacological stress test. In addition, rest and stress EKG showed sinus rhythm, left ventricular hypertrophy, nondiagnostic lateral ST depression <1 mm. Normal myocardial perfusion. Stress LVEF 50%. Low risk study.     Left Heart Catheterization 05/09/21:  LV: 108/2, EDP 12 mmHg.  Ao 111/58, mean 106 mmHg.  No pressure gradient across the aortic valve.  LVEDP is normal.  LV gram not performed to conserve contrast. LM: Large-caliber vessel, smooth and normal. LAD: Very large caliber vessel, gives origin to a large D1 and several small diagonals.  The LAD has mild proximal coronary calcification without luminal obstruction.  The LAD and diagonal are tortuous. Circumflex: There is minimal ectasia noted in the midsegment otherwise smooth and normal.  Gives origin to a moderate-sized OM1 and a large OM 2. RCA: [place] has a 30% calcific stenosis.  No damping of pressure in spite of the catheter being placed deep inside the artery.  Otherwise RCA smooth and mildly tortuous. - Impression: Abnormal EKG and positive cardiac markers is due to demand ischemia from hypertensive urgency.Marland KitchenMarland KitchenFrom cardiac standpoint she can come off of Plavix as she is now having significant GI issues.  If abdominal discomfort persist, she may need GI consult.     Renal artery duplex 05/09/2021: Summary:  Renal:  Right: Normal size right kidney. Mildly elevated RI No evidence of         right renal artery stenosis. RRV flow present.  Left:  Left renal artery appears occluded. No perfusion observed to          left kidney. Abnormal size for the left kidney. Abnormal         cortical thickness of the left kidney. Atrophic appearance.  Mesenteric:  Stented SMA appears patent. Velocities appear essentially unchanged  compared to  previous examination on 04-11-2021. Celiac artery velocities consistent  with  previously identified 50% CA stenosis on 12-28-2020.      Abdominal Aortogram 12/28/2020 Stanford Breed, Marcello Moores, MD): OPERATIVE FINDINGS:  Aortogram reveals prior terminal aortic stenting prior renal stenting CO2 aortography performed for majority of diagnostic studies 70% SMA stenosis. 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 95% right common iliac stenosis Unremarkable SMA stenting. Unable to track a stent into the celiac artery.  I elected to not continue because of the risk of contrast nephropathy. Right common iliac stenosis stented.  Post stenting we noted perforation with extravasation in the right common iliac artery as well as ulceration in the aortic stent. I elected to cover all of these lesions with terminal aortic stenting and bilateral common iliac artery stenting. Angiogram shows excellent flow through the terminal aorta and through the aortic bifurcation.     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 Stenosis in the  right external carotid artery (<50%).  Stenosis in the left internal carotid artery (16-49%). Stenosis in the  left external carotid artery (<50%).  Antegrade right vertebral artery flow. Antegrade left vertebral artery  flow.  Follow up in 12 months is appropriate if clinically indicated.  Compared to the study done on 04/08/2020, left ICA stenosis was in the  range of 50-69%., now <50%.     Echocardiogram 10/16/2019:  Normal LV systolic function with EF 60%. Left ventricle cavity is normal  in size. Normal global wall motion. Normal diastolic filling pattern.  Calculated EF 60%.  Left atrial cavity is mildly dilated in 4  chamber views.  Structurally normal mitral valve.  Mild (Grade I) mitral regurgitation.  Structurally normal tricuspid valve.  Mild tricuspid regurgitation. No  evidence of pulmonary hypertension.  Compared to 02/25/2017, no significant change.     Past Medical History:  Diagnosis Date   Anemia 2008   Anginal pain ([place])    Anxiety    Aortic stenosis    abominal aorta, s/p distal aortic stent 09/28/18, 12/01/20   Arthritis    "all my joints ache" (09/07/2014)   Asthma    Bipolar 1 disorder ([place])    Blood transfusion without reported diagnosis    Bradycardia    Bruit    Carpal tunnel syndrome    Cataract    forming   Cervical cancer ([place]) 1985   CHF (congestive heart failure) ([place])    Chronic kidney disease (CKD), stage V ([place])    COPD (chronic obstructive pulmonary disease) ([place]) 2000   Coronary artery disease    Daily headache    Depression 2000   Diverticulitis 2008   Emphysema of lung ([place])    GERD (gastroesophageal reflux disease)    Glaucoma    Heart murmur    History of colon polyps 2009   HLD (hyperlipidemia) 2013   Hypertension 2013   Hypovitaminosis D    IBS (irritable bowel syndrome) 2008   Myocardial infarction ([place])    2015  PAD (peripheral artery disease) ([place])    Pancreatitis 10/2011   Pneumonia 07/2014   RLS (restless legs syndrome)    Schizophrenia ([facility])    Shortness of breath    Skin cancer    Small bowel obstruction ([place]) 2008   Thyroid disease     Past Surgical History:  Procedure Laterality Date   ABDOMINAL ANGIOGRAM N/A 09/07/2014   Procedure: ABDOMINAL ANGIOGRAM;  Surgeon: Laverda Page, MD;  Location: [facility] CATH LAB;  Service: Cardiovascular;  Laterality: N/A;   ABDOMINAL AORTOGRAM W/LOWER EXTREMITY N/A 12/28/2020   Procedure: ABDOMINAL AORTOGRAM W/LOWER EXTREMITY;  Surgeon: Cherre Robins, MD;  Location: [place] CV LAB;  Service: Cardiovascular;  Laterality: N/A;   APPENDECTOMY  10/01/1994   BIOPSY  08/06/2020   Procedure:  BIOPSY;  Surgeon: Jackquline Denmark, MD;  Location: [facility] ENDOSCOPY;  Service: Endoscopy;;   COLON SURGERY  10/01/2006   6 inches of colon removed due to obstruction   COLONOSCOPY WITH PROPOFOL N/A 10/25/2016   Procedure: COLONOSCOPY WITH PROPOFOL;  Surgeon: Milus Banister, MD;  Location: [facility] ENDOS[facility];  Service: Endoscopy;  Laterality: N/A;   COLONOSCOPY WITH PROPOFOL N/A 08/07/2020   Procedure: COLONOSCOPY WITH PROPOFOL;  Surgeon: Jackquline Denmark, MD;  Location: [facility];  Service: Endoscopy;  Laterality: N/A;   COLONOSCOPY WITH PROPOFOL N/A 08/27/2020   Procedure: COLONOSCOPY WITH PROPOFOL;  Surgeon: Lavena Bullion, DO;  Location: [facility];  Service: Gastroenterology;  Laterality: N/A;   CORONARY ANGIOPLASTY WITH STENT PLACEMENT  09/07/2014   "2"   ENTEROSCOPY N/A 08/27/2020   Procedure: ENTEROSCOPY;  Surgeon: Lavena Bullion, DO;  Location: [facility];  Service: Gastroenterology;  Laterality: N/A;  Push enteroscopy    ENTEROSCOPY N/A 06/12/2021   Procedure: ENTEROSCOPY;  Surgeon: Rush Landmark Telford Nab., MD;  Location: [facility];  Service: Gastroenterology;  Laterality: N/A;   ENTEROSCOPY N/A 08/18/2021   Procedure: ENTEROSCOPY;  Surgeon: Jerene Bears, MD;  Location: [facility];  Service: Gastroenterology;  Laterality: N/A;   ESOPHAGOGASTRODUODENOSCOPY (EGD) WITH PROPOFOL N/A 08/06/2020   Procedure: ESOPHAGOGASTRODUODENOSCOPY (EGD) WITH PROPOFOL;  Surgeon: Jackquline Denmark, MD;  [facility]: [facility];  Service: Endoscopy;  Laterality: N/A;   GIVENS CAPSULE STUDY N/A 08/24/2020   Procedure: GIVENS CAPSULE STUDY;  Surgeon: Lavena Bullion, DO;  [facility]: [facility];  Service: Gastroenterology;  Laterality: N/A;   GIVENS CAPSULE STUDY N/A 08/18/2021   Procedure: GIVENS CAPSULE;  Surgeon: Jerene Bears, MD;  [facility]: [facility];  Service: Gastroenterology;  Laterality: N/A;   HOT HEMOSTASIS N/A 08/07/2020   Procedure: HOT HEMOSTASIS (ARGON PLASMA COAGULATION/BICAP);   Surgeon: Jackquline Denmark, MD;  Location: [facility];  Service: Endoscopy;  Laterality: N/A;   HOT HEMOSTASIS N/A 08/27/2020   Procedure: HOT HEMOSTASIS (ARGON PLASMA COAGULATION/BICAP);  Surgeon: Lavena Bullion, DO;  [facility];  Service: Gastroenterology;  Laterality: N/A;   IR FLUORO GUIDE CV LINE RIGHT  08/21/2021   IR US GUIDE VASC ACCESS RIGHT  08/21/2021   LEFT HEART CATH AND CORONARY ANGIOGRAPHY N/A 03/25/2018   Procedure: LEFT HEART CATH AND CORONARY ANGIOGRAPHY;  Surgeon: Nigel Mormon, MD;  Location: [place] CV LAB;  Service: Cardiovascular;  Laterality: N/A;   LEFT HEART CATH AND CORONARY ANGIOGRAPHY N/A 05/09/2021   Procedure: LEFT HEART CATH AND CORONARY ANGIOGRAPHY;  Surgeon: Adrian Prows, MD;  Location: [place] CV LAB;  Service: Cardiovascular;  Laterality: N/A;   LEFT HEART CATHETERIZATION WITH CORONARY ANGIOGRAM N/A 09/07/2014   Procedure: LEFT HEART CATHETERIZATION WITH CORONARY ANGIOGRAM;  Surgeon: Laverda Page, MD;  Location: [facility] CATH LAB;  Service: Cardiovascular;  Laterality: N/A;   LOWER EXTREMITY ANGIOGRAPHY  10/29/2017   Procedure: Lower Extremity Angiography;  Surgeon: Adrian Prows, MD;  Location: [place] CV LAB;  Service: Cardiovascular;;   PERIPHERAL VASCULAR CATHETERIZATION N/A 11/01/2015   Procedure: Renal Angiography;  Surgeon: Adrian Prows, MD;  Location: [facility] CV LAB;  Service: Cardiovascular;  Laterality: N/A;   PERIPHERAL VASCULAR CATHETERIZATION N/A 04/10/2016   Procedure: Renal Angiography;  Surgeon: Adrian Prows, MD;  Location: [place] CV LAB;  Service: Cardiovascular;  Laterality: N/A;   PERIPHERAL VASCULAR CATHETERIZATION  04/10/2016   Procedure: Peripheral Vascular Intervention;  Surgeon: Adrian Prows, MD;  Location: [place] CV LAB;  Service: Cardiovascular;;   PERIPHERAL VASCULAR INTERVENTION  10/29/2017   Procedure: PERIPHERAL VASCULAR INTERVENTION;  Surgeon: Adrian Prows, MD;  Location: [place] CV LAB;  Service:  Cardiovascular;;   PERIPHERAL VASCULAR INTERVENTION Bilateral 12/28/2020   Procedure: PERIPHERAL VASCULAR INTERVENTION;  Surgeon: Cherre Robins, MD;  Location: [place] CV LAB;  Service: Cardiovascular;  Laterality: Bilateral;   POLYPECTOMY  08/07/2020   Procedure: POLYPECTOMY;  Surgeon: Jackquline Denmark, MD;  Location: [facility];  Service: Endoscopy;;   POLYPECTOMY  08/27/2020   Procedure: POLYPECTOMY;  Surgeon: Lavena Bullion, DO;  Location: [facility];  Service: Gastroenterology;;   POLYPECTOMY     RENAL ANGIOGRAPHY N/A 10/29/2017   Procedure: RENAL ANGIOGRAPHY;  Surgeon: Adrian Prows, MD;  Location: [place] CV LAB;  Service: Cardiovascular;  Laterality: N/A;   RENAL ANGIOGRAPHY N/A 05/10/2020   Procedure: RENAL ANGIOGRAPHY;  Surgeon: Nigel Mormon, MD;  Location: [place] CV LAB;  Service: Cardiovascular;  Laterality: N/A;   RIGHT OOPHORECTOMY Right 10/01/1994   SUBMUCOSAL TATTOO INJECTION  06/12/2021   Procedure: SUBMUCOSAL TATTOO INJECTION;  Surgeon: Irving Copas., MD;  Location: [facility];  Service: Gastroenterology;;   TOTAL ABDOMINAL HYSTERECTOMY  10/02/1995   UPPER GASTROINTESTINAL ENDOSCOPY      MEDICATIONS: No current facility-administered medications for this encounter.    isosorbide mononitrate (IMDUR) 60 MG 24 hr tablet   albuterol (VENTOLIN HFA) 108 (90 Base) MCG/ACT inhaler   ALPRAZolam (XANAX) 1 MG tablet   amLODipine (NORVASC) 10 MG tablet   atorvastatin (LIPITOR) 80 MG tablet   azelastine (ASTELIN) 0.1 % nasal spray   calcium acetate (PHOSLO) 667 MG capsule   ezetimibe (ZETIA) 10 MG tablet   labetalol (NORMODYNE) 100 MG tablet   LINZESS 72 MCG capsule   megestrol (MEGACE) 40 MG tablet   mometasone-formoterol (DULERA) 100-5 MCG/ACT AERO   nitroGLYCERIN (NITROSTAT) 0.4 MG SL tablet   ondansetron (ZOFRAN-ODT) 4 MG disintegrating tablet   oxyCODONE-acetaminophen (PERCOCET/ROXICET) 5-325 MG tablet   pantoprazole (PROTONIX) 40 MG  tablet   promethazine (PHENERGAN) 25 MG tablet   venlafaxine XR (EFFEXOR XR) 150 MG 24 hr capsule    Myra Gianotti, PA-C Surgical Short Stay/Anesthesiology [facility] Phone [phone] [facility] Phone [phone] 09/13/2021 11:42 AM

## 2021-09-14 ENCOUNTER — Other Ambulatory Visit: Payer: Self-pay

## 2021-09-14 ENCOUNTER — Ambulatory Visit (HOSPITAL_COMMUNITY): Payer: Medicare Other | Admitting: Physician Assistant

## 2021-09-14 ENCOUNTER — Encounter (HOSPITAL_COMMUNITY)
Admission: RE | Disposition: A | Discharge status: Home / Self Care | Payer: Self-pay | Source: Home / Self Care | Attending: Vascular Surgery

## 2021-09-14 ENCOUNTER — Ambulatory Visit (HOSPITAL_COMMUNITY)
Admission: RE | Admit: 2021-09-14 | Discharge: 2021-09-14 | Disposition: A | Discharge status: Home / Self Care | Payer: Medicare Other | Attending: Vascular Surgery | Admitting: Vascular Surgery

## 2021-09-14 ENCOUNTER — Encounter (HOSPITAL_COMMUNITY): Payer: Self-pay | Admitting: Vascular Surgery

## 2021-09-14 DIAGNOSIS — I132 Hypertensive heart and chronic kidney disease with heart failure and with stage 5 chronic kidney disease, or end stage renal disease: Secondary | ICD-10-CM | POA: Diagnosis not present

## 2021-09-14 DIAGNOSIS — Z87891 Personal history of nicotine dependence: Secondary | ICD-10-CM | POA: Insufficient documentation

## 2021-09-14 DIAGNOSIS — N186 End stage renal disease: Secondary | ICD-10-CM

## 2021-09-14 DIAGNOSIS — I509 Heart failure, unspecified: Secondary | ICD-10-CM | POA: Insufficient documentation

## 2021-09-14 DIAGNOSIS — N185 Chronic kidney disease, stage 5: Secondary | ICD-10-CM | POA: Diagnosis not present

## 2021-09-14 DIAGNOSIS — Z992 Dependence on renal dialysis: Secondary | ICD-10-CM | POA: Insufficient documentation

## 2021-09-14 HISTORY — PX: AV FISTULA PLACEMENT: SHX1204

## 2021-09-14 LAB — POCT I-STAT, CHEM 8
BUN: 30 mg/dL — ABNORMAL HIGH (ref 6–20)
Calcium, Ion: 1.02 mmol/L — ABNORMAL LOW (ref 1.15–1.40)
Chloride: 102 mmol/L (ref 98–111)
Creatinine, Ser: 3.2 mg/dL — ABNORMAL HIGH (ref 0.44–1.00)
Glucose, Bld: 92 mg/dL (ref 70–99)
HCT: 32 % — ABNORMAL LOW (ref 36.0–46.0)
Hemoglobin: 10.9 g/dL — ABNORMAL LOW (ref 12.0–15.0)
Potassium: 5.1 mmol/L (ref 3.5–5.1)
Sodium: 136 mmol/L (ref 135–145)
TCO2: 27 mmol/L (ref 22–32)

## 2021-09-14 SURGERY — INSERTION OF ARTERIOVENOUS (AV) GORE-TEX GRAFT ARM
Anesthesia: General | Site: Arm Upper | Laterality: Left

## 2021-09-14 MED ORDER — ONDANSETRON HCL 4 MG/2ML IJ SOLN
INTRAMUSCULAR | Status: AC
  Filled 2021-09-14: qty 2

## 2021-09-14 MED ORDER — LIDOCAINE 2% (20 MG/ML) 5 ML SYRINGE
INTRAMUSCULAR | Status: AC
  Filled 2021-09-14: qty 5

## 2021-09-14 MED ORDER — HEPARIN 6000 UNIT IRRIGATION SOLUTION
Status: AC
  Filled 2021-09-14: qty 500

## 2021-09-14 MED ORDER — AMISULPRIDE (ANTIEMETIC) 5 MG/2ML IV SOLN: 10.0000 mg | Freq: Once | INTRAVENOUS | Status: DC | PRN

## 2021-09-14 MED ORDER — LIDOCAINE-EPINEPHRINE (PF) 1 %-1:200000 IJ SOLN
INTRAMUSCULAR | Status: DC | PRN
  Administered 2021-09-14: 9 mL

## 2021-09-14 MED ORDER — MIDAZOLAM HCL 2 MG/2ML IJ SOLN
INTRAMUSCULAR | Status: DC | PRN
  Administered 2021-09-14 (×2): .5 mg via INTRAVENOUS

## 2021-09-14 MED ORDER — 0.9 % SODIUM CHLORIDE (POUR BTL) OPTIME
TOPICAL | Status: DC | PRN
  Administered 2021-09-14: 500 mL

## 2021-09-14 MED ORDER — FENTANYL CITRATE (PF) 250 MCG/5ML IJ SOLN
INTRAMUSCULAR | Status: DC | PRN
  Administered 2021-09-14 (×3): 50 ug via INTRAVENOUS

## 2021-09-14 MED ORDER — CEFAZOLIN SODIUM-DEXTROSE 2-4 GM/100ML-% IV SOLN
2.0000 g | INTRAVENOUS | Status: AC
  Administered 2021-09-14: 2 g via INTRAVENOUS
  Filled 2021-09-14: qty 100

## 2021-09-14 MED ORDER — HEMOSTATIC AGENTS (NO CHARGE) OPTIME
TOPICAL | Status: DC | PRN
  Administered 2021-09-14: 1 via TOPICAL

## 2021-09-14 MED ORDER — CHLORHEXIDINE GLUCONATE 4 % EX LIQD: 60.0000 mL | Freq: Once | CUTANEOUS | Status: DC

## 2021-09-14 MED ORDER — FENTANYL CITRATE (PF) 100 MCG/2ML IJ SOLN
INTRAMUSCULAR | Status: AC
  Filled 2021-09-14: qty 2

## 2021-09-14 MED ORDER — OXYCODONE HCL 5 MG PO TABS: 5.0000 mg | ORAL_TABLET | Freq: Once | ORAL | Status: DC | PRN

## 2021-09-14 MED ORDER — DEXAMETHASONE SODIUM PHOSPHATE 10 MG/ML IJ SOLN
INTRAMUSCULAR | Status: DC | PRN
  Administered 2021-09-14: 10 mg via INTRAVENOUS

## 2021-09-14 MED ORDER — PROTAMINE SULFATE 10 MG/ML IV SOLN
INTRAVENOUS | Status: DC | PRN
  Administered 2021-09-14: 30 mg via INTRAVENOUS

## 2021-09-14 MED ORDER — LIDOCAINE-EPINEPHRINE (PF) 1 %-1:200000 IJ SOLN
INTRAMUSCULAR | Status: AC
  Filled 2021-09-14: qty 30

## 2021-09-14 MED ORDER — HEPARIN 6000 UNIT IRRIGATION SOLUTION
Status: DC | PRN
  Administered 2021-09-14: 1

## 2021-09-14 MED ORDER — HEPARIN SODIUM (PORCINE) 1000 UNIT/ML IJ SOLN
INTRAMUSCULAR | Status: DC | PRN
  Administered 2021-09-14: 4000 [IU] via INTRAVENOUS

## 2021-09-14 MED ORDER — FENTANYL CITRATE (PF) 250 MCG/5ML IJ SOLN
INTRAMUSCULAR | Status: AC
  Filled 2021-09-14: qty 5

## 2021-09-14 MED ORDER — DEXAMETHASONE SODIUM PHOSPHATE 10 MG/ML IJ SOLN
INTRAMUSCULAR | Status: AC
  Filled 2021-09-14: qty 1

## 2021-09-14 MED ORDER — PROMETHAZINE HCL 25 MG/ML IJ SOLN: 6.2500 mg | INTRAMUSCULAR | Status: DC | PRN

## 2021-09-14 MED ORDER — OXYCODONE HCL 5 MG/5ML PO SOLN: 5.0000 mg | Freq: Once | ORAL | Status: DC | PRN

## 2021-09-14 MED ORDER — CHLORHEXIDINE GLUCONATE 0.12 % MT SOLN
OROMUCOSAL | Status: AC
  Administered 2021-09-14: 15 mL
  Filled 2021-09-14: qty 15

## 2021-09-14 MED ORDER — ONDANSETRON HCL 4 MG/2ML IJ SOLN
INTRAMUSCULAR | Status: DC | PRN
  Administered 2021-09-14: 4 mg via INTRAVENOUS

## 2021-09-14 MED ORDER — HEPARIN SODIUM (PORCINE) 1000 UNIT/ML IJ SOLN
INTRAMUSCULAR | Status: AC
  Filled 2021-09-14: qty 10

## 2021-09-14 MED ORDER — DEXMEDETOMIDINE (PRECEDEX) IN NS 20 MCG/5ML (4 MCG/ML) IV SYRINGE
PREFILLED_SYRINGE | INTRAVENOUS | Status: AC
  Filled 2021-09-14: qty 5

## 2021-09-14 MED ORDER — ALBUTEROL SULFATE (2.5 MG/3ML) 0.083% IN NEBU
2.5000 mg | INHALATION_SOLUTION | RESPIRATORY_TRACT | Status: DC
  Filled 2021-09-14: qty 3

## 2021-09-14 MED ORDER — MIDAZOLAM HCL 2 MG/2ML IJ SOLN
INTRAMUSCULAR | Status: AC
  Filled 2021-09-14: qty 2

## 2021-09-14 MED ORDER — FENTANYL CITRATE (PF) 100 MCG/2ML IJ SOLN
25.0000 ug | INTRAMUSCULAR | Status: DC | PRN
  Administered 2021-09-14: 50 ug via INTRAVENOUS

## 2021-09-14 MED ORDER — PROTAMINE SULFATE 10 MG/ML IV SOLN
INTRAVENOUS | Status: AC
  Filled 2021-09-14: qty 5

## 2021-09-14 MED ORDER — LIDOCAINE 2% (20 MG/ML) 5 ML SYRINGE
INTRAMUSCULAR | Status: DC | PRN
  Administered 2021-09-14: 40 mg via INTRAVENOUS

## 2021-09-14 MED ORDER — PROPOFOL 10 MG/ML IV BOLUS
INTRAVENOUS | Status: DC | PRN
  Administered 2021-09-14: 100 mg via INTRAVENOUS

## 2021-09-14 MED ORDER — SODIUM CHLORIDE 0.9 % IV SOLN: INTRAVENOUS | Status: DC

## 2021-09-14 SURGICAL SUPPLY — 40 items
ADH SKN CLS APL DERMABOND .7 (GAUZE/BANDAGES/DRESSINGS) ×1
APL PRP STRL LF DISP 70% ISPRP (MISCELLANEOUS) ×1
APL SKNCLS STERI-STRIP NONHPOA (GAUZE/BANDAGES/DRESSINGS) ×1
ARMBAND PINK RESTRICT EXTREMIT (MISCELLANEOUS) ×2 IMPLANT
BENZOIN TINCTURE PRP APPL 2/3 (GAUZE/BANDAGES/DRESSINGS) ×2 IMPLANT
CANISTER SUCT 3000ML PPV (MISCELLANEOUS) ×2 IMPLANT
CANNULA VESSEL 3MM 2 BLNT TIP (CANNULA) ×2 IMPLANT
CHLORAPREP W/TINT 26 (MISCELLANEOUS) ×2 IMPLANT
DERMABOND ADVANCED (GAUZE/BANDAGES/DRESSINGS) ×1
DERMABOND ADVANCED .7 DNX12 (GAUZE/BANDAGES/DRESSINGS) IMPLANT
ELECT REM PT RETURN 9FT ADLT (ELECTROSURGICAL) ×2
ELECTRODE REM PT RTRN 9FT ADLT (ELECTROSURGICAL) ×1 IMPLANT
GLOVE SURG ENC MOIS LTX SZ8 (GLOVE) ×2 IMPLANT
GOWN STRL REUS W/ TWL LRG LVL3 (GOWN DISPOSABLE) ×2 IMPLANT
GOWN STRL REUS W/ TWL XL LVL3 (GOWN DISPOSABLE) ×1 IMPLANT
GOWN STRL REUS W/TWL LRG LVL3 (GOWN DISPOSABLE) ×4
GOWN STRL REUS W/TWL XL LVL3 (GOWN DISPOSABLE) ×2
GRAFT GORETEX STRT 4-7X45 (Vascular Products) ×1 IMPLANT
HEMOSTAT SNOW SURGICEL 2X4 (HEMOSTASIS) ×1 IMPLANT
INSERT FOGARTY SM (MISCELLANEOUS) ×2 IMPLANT
KIT BASIN OR (CUSTOM PROCEDURE TRAY) ×2 IMPLANT
KIT TURNOVER KIT B (KITS) ×2 IMPLANT
NDL 18GX1X1/2 (RX/OR ONLY) (NEEDLE) IMPLANT
NEEDLE 18GX1X1/2 (RX/OR ONLY) (NEEDLE) IMPLANT
NS IRRIG 1000ML POUR BTL (IV SOLUTION) ×2 IMPLANT
PACK CV ACCESS (CUSTOM PROCEDURE TRAY) ×2 IMPLANT
PAD ARMBOARD 7.5X6 YLW CONV (MISCELLANEOUS) ×4 IMPLANT
SLING ARM FOAM STRAP LRG (SOFTGOODS) IMPLANT
STRIP CLOSURE SKIN 1/2X4 (GAUZE/BANDAGES/DRESSINGS) ×2 IMPLANT
SUT GORETEX 6.0 TT9 (SUTURE) ×2 IMPLANT
SUT MNCRL AB 4-0 PS2 18 (SUTURE) IMPLANT
SUT PROLENE 6 0 BV (SUTURE) ×3 IMPLANT
SUT SILK 2 0 SH (SUTURE) IMPLANT
SUT VIC AB 3-0 SH 27 (SUTURE) ×4
SUT VIC AB 3-0 SH 27X BRD (SUTURE) ×2 IMPLANT
SYR 3ML LL SCALE MARK (SYRINGE) IMPLANT
SYR TOOMEY 50ML (SYRINGE) IMPLANT
TOWEL GREEN STERILE (TOWEL DISPOSABLE) ×2 IMPLANT
UNDERPAD 30X36 HEAVY ABSORB (UNDERPADS AND DIAPERS) ×2 IMPLANT
WATER STERILE IRR 1000ML POUR (IV SOLUTION) ×2 IMPLANT

## 2021-09-14 NOTE — H&P (Signed)
[facility] OF [place]  ASSESSMENT / PLAN: 60 y.o. female status post SMA stenting for chronic mesenteric ischemia and re-do aorto-bi-iliac stenting for occlusive disease with history of terminal aortic stenting 12/28/20. Now with deteriorating renal function in need of dialysis access.  Recommend the following which can slow the progression of atherosclerosis and reduce the risk of major adverse cardiac / limb events:  Complete cessation from all tobacco products. Blood glucose control with goal A1c < 7%. Blood pressure control with goal blood pressure < 140/90 mmHg. Lipid reduction therapy with goal LDL-C <100 mg/dL (<70 if symptomatic from PAD).  Aspirin 81mg  PO QD.  Clopidogrel 75mg  PO QD. Atorvastatin 40-80mg  PO QD (or other "high intensity" statin therapy).  Plan left arm arteriovenous graft 08/16/21.   CHIEF COMPLAINT: Abdominal pain  HISTORY OF PRESENT ILLNESS:  Previously: [name] is a 60 y.o. female referred to clinic for evaluation of possible chronic mesenteric ischemia.  She has had a thorough work-up by gastroenterologist for constant generalized abdominal pain.  The pain is exacerbated by eating.  She reports she has lost nearly 30 pounds over the past year.  She reports she is afraid to eat.  01/10/21: Patient returns to clinic concerned about pain and "bulging" from her left groin.  She reports she continues to have some abdominal pain but has been able to eat.  04/11/21: still having abdominal pain. Planned to undergo endoscopy in near future. Having restless legs. No change in non-invasive studies.  08/07/21: Patient returns with deteriorating renal function. She is in need of permanent dialysis access. She is right handed. no history of central venous catheter. no history of pacemaker.   VASCULAR SURGICAL HISTORY:  Bilateral renal artery stenting by Dr. Einar Gip intervention 05/10/2020 Distal aortic stenting by Dr. Einar Gip with 10 x 29  Omnilink postdilated to 12 mm 10/29/2017 SMA stenting (6x10mm HercuLink); Terminal aortic stenting (8x37mm VBX post dilated to 15mm; Bilateral common iliac artery stenting (7x28mm VBX x 2) by me 12/28/20.  VASCULAR RISK FACTORS: Patient reports: -No history of cerebrovascular disease / stroke / transient ischemic attack. - No history of coronary artery disease. Coronary cath June 2019 unremarkable. - No history of diabetes mellitus (No results found for: HGBA1C). - Lifelong history of smoking. Actively smoking. - + history of hypertension.  - + history of chronic kidney disease (GFR 28). - + history of chronic obstructive pulmonary disease - HF with preserved ejection fraction (EF 60% January 2021).   Past Medical History:  Diagnosis Date   Anemia 2008   Anginal pain ([place])    Anxiety    Aortic stenosis    abominal aorta, s/p distal aortic stent 09/28/18, 12/01/20   Arthritis    "all my joints ache" (09/07/2014)   Asthma    Bipolar 1 disorder ([place])    Blood transfusion without reported diagnosis    Bradycardia    Bruit    Carpal tunnel syndrome    Cataract    forming   Cervical cancer ([place]) 1985   CHF (congestive heart failure) ([place])    Chronic kidney disease (CKD), stage V ([place])    COPD (chronic obstructive pulmonary disease) ([place]) 2000   Coronary artery disease    Daily headache    Depression 2000   Diverticulitis 2008   Emphysema of lung ([place])    GERD (gastroesophageal reflux disease)    Glaucoma    Heart murmur    History of colon polyps 2009   HLD (hyperlipidemia) 2013  Hypertension 2013   Hypovitaminosis D    IBS (irritable bowel syndrome) 2008   Myocardial infarction [facility])    2015   PAD (peripheral artery disease) ([place])    Pancreatitis 10/2011   Pneumonia 07/2014   RLS (restless legs syndrome)    Schizophrenia ([facility])    Shortness of breath    Skin cancer    Small bowel obstruction ([place]) 2008   Thyroid disease     Past Surgical History:  Procedure  Laterality Date   ABDOMINAL ANGIOGRAM N/A 09/07/2014   Procedure: ABDOMINAL ANGIOGRAM;  Surgeon: Laverda Page, MD;  Location: [facility] CATH LAB;  Service: Cardiovascular;  Laterality: N/A;   ABDOMINAL AORTOGRAM W/LOWER EXTREMITY N/A 12/28/2020   Procedure: ABDOMINAL AORTOGRAM W/LOWER EXTREMITY;  Surgeon: Cherre Robins, MD;  Location: [place] CV LAB;  Service: Cardiovascular;  Laterality: N/A;   APPENDECTOMY  10/01/1994   BIOPSY  08/06/2020   Procedure: BIOPSY;  Surgeon: Jackquline Denmark, MD;  Location: [facility] ENDOSCOPY;  Service: Endoscopy;;   COLON SURGERY  10/01/2006   6 inches of colon removed due to obstruction   COLONOSCOPY WITH PROPOFOL N/A 10/25/2016   Procedure: COLONOSCOPY WITH PROPOFOL;  Surgeon: Milus Banister, MD;  Location: [facility] ENDOSCOPY;  Service: Endoscopy;  Laterality: N/A;   COLONOSCOPY WITH PROPOFOL N/A 08/07/2020   Procedure: COLONOSCOPY WITH PROPOFOL;  Surgeon: Jackquline Denmark, MD;  Location: [facility]OS[facility];  Service: Endoscopy;  Laterality: N/A;   COLONOSCOPY WITH PROPOFOL N/A 08/27/2020   Procedure: COLONOSCOPY WITH PROPOFOL;  Surgeon: Lavena Bullion, DO;  Location: [facility]OS[facility];  Service: Gastroenterology;  Laterality: N/A;   CORONARY ANGIOPLASTY WITH STENT PLACEMENT  09/07/2014   "2"   ENTEROSCOPY N/A 08/27/2020   Procedure: ENTEROSCOPY;  Surgeon: Lavena Bullion, DO;  Location: [facility];  Service: Gastroenterology;  Laterality: N/A;  Push enteroscopy    ENTEROSCOPY N/A 06/12/2021   Procedure: ENTEROSCOPY;  Surgeon: Rush Landmark Telford Nab., MD;  Location: [facility];  Service: Gastroenterology;  Laterality: N/A;   ENTEROSCOPY N/A 08/18/2021   Procedure: ENTEROSCOPY;  Surgeon: Jerene Bears, MD;  [facility]: [facility];  Service: Gastroenterology;  Laterality: N/A;   ESOPHAGOGASTRODUODENOSCOPY (EGD) WITH PROPOFOL N/A 08/06/2020   Procedure: ESOPHAGOGASTRODUODENOSCOPY (EGD) WITH PROPOFOL;  Surgeon: Jackquline Denmark, MD;  [facility];  Service: Endoscopy;   Laterality: N/A;   GIVENS CAPSULE STUDY N/A 08/24/2020   Procedure: GIVENS CAPSULE STUDY;  Surgeon: Lavena Bullion, DO;  [facility];  Service: Gastroenterology;  Laterality: N/A;   GIVENS CAPSULE STUDY N/A 08/18/2021   Procedure: GIVENS CAPSULE;  Surgeon: Jerene Bears, MD;  [facility];  Service: Gastroenterology;  Laterality: N/A;   HOT HEMOSTASIS N/A 08/07/2020   Procedure: HOT HEMOSTASIS (ARGON PLASMA COAGULATION/BICAP);  Surgeon: Jackquline Denmark, MD;  Location: [facility];  Service: Endoscopy;  Laterality: N/A;   HOT HEMOSTASIS N/A 08/27/2020   Procedure: HOT HEMOSTASIS (ARGON PLASMA COAGULATION/BICAP);  Surgeon: Lavena Bullion, DO;  [facility];  Service: Gastroenterology;  Laterality: N/A;   IR FLUORO GUIDE CV LINE RIGHT  08/21/2021   IR US GUIDE VASC ACCESS RIGHT  08/21/2021   LEFT HEART CATH AND CORONARY ANGIOGRAPHY N/A 03/25/2018   Procedure: LEFT HEART CATH AND CORONARY ANGIOGRAPHY;  Surgeon: Nigel Mormon, MD;  Location: [place] CV LAB;  Service: Cardiovascular;  Laterality: N/A;   LEFT HEART CATH AND CORONARY ANGIOGRAPHY N/A 05/09/2021   Procedure: LEFT HEART CATH AND CORONARY ANGIOGRAPHY;  Surgeon: Adrian Prows, MD;  Location: [place] CV LAB;  Service: Cardiovascular;  Laterality: N/A;   LEFT HEART CATHETERIZATION WITH CORONARY ANGIOGRAM N/A 09/07/2014   Procedure: LEFT HEART CATHETERIZATION WITH CORONARY ANGIOGRAM;  Surgeon: Laverda Page, MD;  Location: [facility] CATH LAB;  Service: Cardiovascular;  Laterality: N/A;   LOWER EXTREMITY ANGIOGRAPHY  10/29/2017   Procedure: Lower Extremity Angiography;  Surgeon: Adrian Prows, MD;  Location: [place] CV LAB;  Service: Cardiovascular;;   PERIPHERAL VASCULAR CATHETERIZATION N/A 11/01/2015   Procedure: Renal Angiography;  Surgeon: Adrian Prows, MD;  Location: [place] CV LAB;  Service: Cardiovascular;  Laterality: N/A;   PERIPHERAL VASCULAR CATHETERIZATION N/A 04/10/2016   Procedure:  Renal Angiography;  Surgeon: Adrian Prows, MD;  Location: [place] CV LAB;  Service: Cardiovascular;  Laterality: N/A;   PERIPHERAL VASCULAR CATHETERIZATION  04/10/2016   Procedure: Peripheral Vascular Intervention;  Surgeon: Adrian Prows, MD;  Location: [place] CV LAB;  Service: Cardiovascular;;   PERIPHERAL VASCULAR INTERVENTION  10/29/2017   Procedure: PERIPHERAL VASCULAR INTERVENTION;  Surgeon: Adrian Prows, MD;  Location: [place] CV LAB;  Service: Cardiovascular;;   PERIPHERAL VASCULAR INTERVENTION Bilateral 12/28/2020   Procedure: PERIPHERAL VASCULAR INTERVENTION;  Surgeon: Cherre Robins, MD;  Location: [place] CV LAB;  Service: Cardiovascular;  Laterality: Bilateral;   POLYPECTOMY  08/07/2020   Procedure: POLYPECTOMY;  Surgeon: Jackquline Denmark, MD;  [facility];  Service: Endoscopy;;   POLYPECTOMY  08/27/2020   Procedure: POLYPECTOMY;  Surgeon: Lavena Bullion, DO;  [facility]: [facility];  Service: Gastroenterology;;   POLYPECTOMY     RENAL ANGIOGRAPHY N/A 10/29/2017   Procedure: RENAL ANGIOGRAPHY;  Surgeon: Adrian Prows, MD;  Location: [place] CV LAB;  Service: Cardiovascular;  Laterality: N/A;   RENAL ANGIOGRAPHY N/A 05/10/2020   Procedure: RENAL ANGIOGRAPHY;  Surgeon: Nigel Mormon, MD;  Location: [place] CV LAB;  Service: Cardiovascular;  Laterality: N/A;   RIGHT OOPHORECTOMY Right 10/01/1994   SUBMUCOSAL TATTOO INJECTION  06/12/2021   Procedure: SUBMUCOSAL TATTOO INJECTION;  Surgeon: Irving Copas., MD;  Location: [facility];  Service: Gastroenterology;;   TOTAL ABDOMINAL HYSTERECTOMY  10/02/1995   UPPER GASTROINTESTINAL ENDOSCOPY      Family History  Problem Relation Age of Onset   Other Mother        many bowel obstructions   Heart disease Mother    Colon polyps Mother    Kidney cancer Father    Bone cancer Father    Diabetes Father    Heart disease Father    Heart disease Brother    Colon cancer Paternal Grandfather     Diabetes Daughter    Esophageal cancer Neg Hx    Rectal cancer Neg Hx    Stomach cancer Neg Hx     Social History   Socioeconomic History   Marital status: Divorced    Spouse name: Not on file   Number of children: 1   Years of education: Not on file   Highest education level: Not on file  Occupational History   Occupation: Scientist, research (life sciences): UNEMPLOYED  Tobacco Use   Smoking status: Former    Packs/day: 1.50    Years: 40.00    Pack years: 60.00    Types: Cigarettes    Quit date: 08/2021    Years since quitting: 0.1   Smokeless tobacco: Never  Vaping Use   Vaping Use: Former   Substances: THC  Substance and Sexual Activity   Alcohol use: No   Drug use: Yes    Frequency: 3.0 times  per week    Types: Marijuana    Comment: last use 09/09/21   Sexual activity: Yes    Birth control/protection: Post-menopausal, Surgical    Comment: Hysterectomy  Other Topics Concern   Not on file  Social History Narrative   Not on file   Social Determinants of Health   Financial Resource Strain: Not on file  Food Insecurity: Not on file  Transportation Needs: Not on file  Physical Activity: Not on file  Stress: Not on file  Social Connections: Not on file  Intimate Partner Violence: Not on file    Allergies  Allergen Reactions   Doxycycline Anaphylaxis and Hives   Hydralazine Shortness Of Breath and Rash    Patient reports "couldn't breath"   Aspirin Other (See Comments)    Internal bleeding- MD SAID to not take this   Hydrocodone Nausea And Vomiting   Ibuprofen Other (See Comments)    Caused internal bleeding   Tylenol [Acetaminophen] Other (See Comments)    MD told the patient to not take this   Iohexol Itching and Other (See Comments)    Pt has itching nose after iv contrast injection    Current Facility-Administered Medications  Medication Dose Route Frequency Provider Last Rate Last Admin   0.9 %  sodium chloride infusion   Intravenous Continuous  Cherre Robins, MD 10 mL/hr at 09/14/21 0831 New Bag at 09/14/21 0831   ceFAZolin (ANCEF) IVPB 2g/100 mL premix  2 g Intravenous 30 min Pre-Op Cherre Robins, MD       chlorhexidine (HIBICLENS) 4 % liquid 4 application  60 mL Topical Once Cherre Robins, MD       And   chlorhexidine (HIBICLENS) 4 % liquid 4 application  60 mL Topical Once Cherre Robins, MD        REVIEW OF SYSTEMS:  [X]  denotes positive finding, [ ]  denotes negative finding Cardiac  Comments:  Chest pain or chest pressure:    Shortness of breath upon exertion:    Short of breath when lying flat:    Irregular heart rhythm:        Vascular    Pain in calf, thigh, or hip brought on by ambulation:    Pain in feet at night that wakes you up from your sleep:     Blood clot in your veins:    Leg swelling:         Pulmonary    Oxygen at home:    Productive cough:     Wheezing:         Neurologic    Sudden weakness in arms or legs:     Sudden numbness in arms or legs:     Sudden onset of difficulty speaking or slurred speech:    Temporary loss of vision in one eye:     Problems with dizziness:         Gastrointestinal    Blood in stool:     Vomited blood:         Genitourinary    Burning when urinating:     Blood in urine:        Psychiatric    Major depression:         Hematologic    Bleeding problems:    Problems with blood clotting too easily:        Skin    Rashes or ulcers:        Constitutional    Fever or chills:  PHYSICAL EXAM  Vitals:   09/14/21 0808 09/14/21 0828  BP: (!) 213/88 (!) 192/96  Pulse: 66   Resp: 18   Temp: 98.4 F (36.9 C)   TempSrc: Oral   SpO2: 90%   Weight: 42.2 kg   Height: 5\' 1"  (1.549 m)     Constitutional: Chronically ill appearing. No distress. Appears under nourished.  Neurologic: CN intact. no focal findings. no sensory loss. Psychiatric: Mood and affect symmetric and appropriate. Eyes: No icterus. No conjunctival pallor. Ears, nose,  throat: mucous membranes moist. Midline trachea.  Cardiac: regular rate and rhythm.  Respiratory: unlabored. Abdominal: soft, non-tender, non-distended.  Peripheral vascular:  2+ L radial and brachial pulse  2+ femoral pulses  trace DP pulses  Extremity: No edema. No cyanosis. No pallor.  Skin: No gangrene. No ulceration.  Lymphatic: No Stemmer's sign. No palpable lymphadenopathy.  PERTINENT LABORATORY AND RADIOLOGIC DATA  Most recent CBC CBC Latest Ref Rng & Units 09/14/2021 08/21/2021 08/20/2021  WBC 4.0 - 10.5 K/uL - 7.5 7.8  Hemoglobin 12.0 - 15.0 g/dL 10.9(L) 9.0(L) 8.9(L)  Hematocrit 36.0 - 46.0 % 32.0(L) 26.7(L) 26.5(L)  Platelets 150 - 400 K/uL - 381 388     Most recent CMP CMP Latest Ref Rng & Units 09/14/2021 08/21/2021 08/20/2021  Glucose 70 - 99 mg/dL 92 77 79  BUN 6 - 20 mg/dL 30(H) 37(H) 43(H)  Creatinine 0.44 - 1.00 mg/dL 3.20(H) 5.66(H) 5.86(H)  Sodium 135 - 145 mmol/L 136 136 135  Potassium 3.5 - 5.1 mmol/L 5.1 3.9 4.1  Chloride 98 - 111 mmol/L 102 107 105  CO2 22 - 32 mmol/L - 20(L) 19(L)  Calcium 8.9 - 10.3 mg/dL - 8.1(L) 7.8(L)  Total Protein 6.5 - 8.1 g/dL - - -  Total Bilirubin 0.3 - 1.2 mg/dL - - -  Alkaline Phos 38 - 126 U/L - - -  AST 15 - 41 U/L - - -  ALT 0 - 44 U/L - - -    LDL Chol Calc (NIH)  Date Value Ref Range Status  03/31/2020 51 0 - 99 mg/dL Final    Mesenteric Duplex 01/16/21 SMA stent not well visualized. Good flow in distal SMA.  Mesenteric Duplex 04/11/21 SMA stent not well visualized. Good flow in distal SMA.  ABI 01/16/21 Normalized ankle indices after stenting  ABI 04/11/21 Normal ankle indices  Preop AVF studies 08/08/21 Marginal RUE basilic vein. No other suitable vein in either upper extremity.  Yevonne Aline. Stanford Breed, MD [facility] Phone Number: [phone] 09/14/2021 9:00 AM

## 2021-09-14 NOTE — Anesthesia Procedure Notes (Signed)
Procedure Name: LMA Insertion Date/Time: 09/14/2021 9:57 AM Performed by: Maude Leriche, CRNA Pre-anesthesia Checklist: Patient identified, Emergency Drugs available, Suction available, Patient being monitored and Timeout performed Patient Re-evaluated:Patient Re-evaluated prior to induction Oxygen Delivery Method: Circle system utilized Preoxygenation: Pre-oxygenation with 100% oxygen Induction Type: IV induction LMA: LMA inserted LMA Size: 3.0 Number of attempts: 1 Tube secured with: Tape Dental Injury: Teeth and Oropharynx as per pre-operative assessment

## 2021-09-14 NOTE — Progress Notes (Signed)
Dr. Smith Robert at bedside encouraging patient to take breathing treatment. Patient agreeable and taking neb at this time.

## 2021-09-14 NOTE — Progress Notes (Signed)
Patient demanding pain meds and refusing breathing treatment until she gets them stating "if he (anesthesiologist) was going to play games, she was too".  MD made aware.

## 2021-09-14 NOTE — Anesthesia Postprocedure Evaluation (Signed)
Anesthesia Post Note  Patient: [name]  Procedure(s) Performed: INSERTION OF ARTERIOVENOUS LEFT UPPER GORE-TEX GRAFT ARM (Left: Arm Upper)     Patient location during evaluation: PACU Anesthesia Type: General Level of consciousness: awake and alert Pain management: pain level controlled Vital Signs Assessment: post-procedure vital signs reviewed and stable Respiratory status: spontaneous breathing, nonlabored ventilation, respiratory function stable and patient connected to nasal cannula oxygen Cardiovascular status: blood pressure returned to baseline and stable Postop Assessment: no apparent nausea or vomiting Anesthetic complications: no   No notable events documented.  Last Vitals:  Vitals:   09/14/21 1145 09/14/21 1200  BP: (!) 170/82 (!) 170/80  Pulse: 68 68  Resp: 13 14  Temp:  36.4 C  SpO2: 95% 97%    Last Pain:  Vitals:   09/14/21 1200  TempSrc:   PainSc: 0-No pain                 Effie Berkshire

## 2021-09-14 NOTE — Transfer of Care (Signed)
Immediate Anesthesia Transfer of Care Note  Patient: [name]  Procedure(s) Performed: INSERTION OF ARTERIOVENOUS LEFT UPPER GORE-TEX GRAFT ARM (Left: Arm Upper)  Patient Location: PACU  Anesthesia Type:General  Level of Consciousness: awake, drowsy and patient cooperative  Airway & Oxygen Therapy: Patient Spontanous Breathing and Patient connected to face mask oxygen  Post-op Assessment: Report given to RN, Post -op Vital signs reviewed and stable, Patient moving all extremities X 4 and Patient able to stick tongue midline  Post vital signs: stable  Last Vitals:  Vitals Value Taken Time  BP 152/78 09/14/21 1117  Temp 97.1   Pulse 63 09/14/21 1120  Resp 11 09/14/21 1120  SpO2 99 % 09/14/21 1120  Vitals shown include unvalidated device data.  Last Pain:  Vitals:   09/14/21 0828  TempSrc:   PainSc: 8       Patients Stated Pain Goal: 6 (71/29/29 0903)  Complications: No notable events documented.

## 2021-09-14 NOTE — Progress Notes (Signed)
Anesthesia made aware of VS, c/o pain, SOB. New orders received and carried out to give Albuterol neb.

## 2021-09-14 NOTE — Discharge Instructions (Signed)
° °  [facility]  Discharge Instructions  AV Fistula or Graft Surgery for Dialysis Access  Please refer to the following instructions for your post-procedure care. Your surgeon or physician assistant will discuss any changes with you.  Activity  You may drive the day following your surgery, if you are comfortable and no longer taking prescription pain medication. Resume full activity as the soreness in your incision resolves.  Bathing/Showering  You may shower after you go home. Keep your incision dry for 48 hours. Do not soak in a bathtub, hot tub, or swim until the incision heals completely. You may not shower if you have a hemodialysis catheter.  Incision Care  Clean your incision with mild soap and water after 48 hours. Pat the area dry with a clean towel. You do not need a bandage unless otherwise instructed. Do not apply any ointments or creams to your incision. You may have skin glue on your incision. Do not peel it off. It will come off on its own in about one week. Your arm may swell a bit after surgery. To reduce swelling use pillows to elevate your arm so it is above your heart. Your doctor will tell you if you need to lightly wrap your arm with an ACE bandage.  Diet  Resume your normal diet. There are not special food restrictions following this procedure. In order to heal from your surgery, it is CRITICAL to get adequate nutrition. Your body requires vitamins, minerals, and protein. Vegetables are the best source of vitamins and minerals. Vegetables also provide the perfect balance of protein. Processed food has little nutritional value, so try to avoid this.  Medications  Resume taking all of your medications. If your incision is causing pain, you may take over-the counter pain relievers such as acetaminophen (Tylenol). If you were prescribed a stronger pain medication, please be aware these medications can cause nausea and constipation. Prevent  nausea by taking the medication with a snack or meal. Avoid constipation by drinking plenty of fluids and eating foods with high amount of fiber, such as fruits, vegetables, and grains.  Do not take Tylenol if you are taking prescription pain medications.  Follow up Your surgeon may want to see you in the office following your access surgery. If so, this will be arranged at the time of your surgery.  Please call us immediately for any of the following conditions:  Increased pain, redness, drainage (pus) from your incision site Fever of 101 degrees or higher Severe or worsening pain at your incision site Hand pain or numbness.  Reduce your risk of vascular disease:  Stop smoking. If you would like help, call [facility] at 1-800-QUIT-NOW 901-463-8239) or [place] at [place] your cholesterol Maintain a desired weight Control your diabetes Keep your blood pressure down  Dialysis  It will take several weeks to several months for your new dialysis access to be ready for use. Your surgeon will determine when it is okay to use it. Your nephrologist will continue to direct your dialysis. You can continue to use your Permcath until your new access is ready for use.   09/14/2021 [name] [id number] 1961-07-10  Surgeon(s): Cherre Robins, MD  Procedure(s): INSERTION OF ARTERIOVENOUS LEFT UPPER GORE-TEX GRAFT ARM  x Do not stick graft for 4 weeks    If you have any questions, please call the office at [phone].

## 2021-09-14 NOTE — Op Note (Signed)
DATE OF SERVICE: 09/14/2021  PATIENT:  [name]  60 y.o. female  PRE-OPERATIVE DIAGNOSIS:  ESRD  POST-OPERATIVE DIAGNOSIS:  Same  PROCEDURE:   Left upper extremity arteriovenous graft creation  SURGEON:  Surgeon(s) and Role:    * Cherre Robins, MD - Primary  ASSISTANT: Sharen Heck, RNFA  An assistant was required to facilitate exposure and expedite the case.  ANESTHESIA:   general  EBL: minimal  BLOOD ADMINISTERED:none  DRAINS: none   LOCAL MEDICATIONS USED:  LIDOCAINE   SPECIMEN:  none  COUNTS: confirmed correct.  TOURNIQUET:  none   PATIENT DISPOSITION:  PACU - hemodynamically stable.   Delay start of Pharmacological VTE agent (>24hrs) due to surgical blood loss or risk of bleeding: yes  INDICATION FOR PROCEDURE: [name] is a 60 y.o. female with ESRD dialyzing via RIJ TDC. After careful discussion of risks, benefits, and alternatives the patient was offered left arm arteriovenous graft. The patient understood and wished to proceed.  OPERATIVE FINDINGS: unremarkable arteriovenous graft. Healthy brachial artery. Healthy brachial vein. Good doppler flow at completion. Weakly palpable radial pulse at completion.  DESCRIPTION OF PROCEDURE: After identification of the patient in the pre-operative holding area, the patient was transferred to the operating room. The patient was positioned supine on the operating room table. Anesthesia was induced. The left arm was prepped and draped in standard fashion. A surgical pause was performed confirming correct patient, procedure, and operative location.  The left brachial artery was exposed using a longitudinal incision in the distal arm just above the antecubital fossa.  Incision was carried down through subcutaneous tissue until the brachial sheath was encountered.  This was incised sharply.  The brachial artery was exposed and encircled with Silastic Vesseloops proximally and distally to the site of planned  inflow.   The left brachial vein at the axilla was exposed using longitudinal incision just below the hairbearing area of the axilla.  Incision was carried down until the brachial sheath was encountered.  The brachial vein was identified, exposed, encircled with Silastic Vesseloops.   Using a curved, sheathed tunneling device, a 4-7 mm tapered Gore-Tex graft was tunneled subcutaneously and gentle arc across the biceps of the left arm.  Patient was then heparinized with 5000 units of IV heparin.   The brachial artery was clamped proximally and distally.  An anterior arteriotomy was made with an 11 blade.  This was extended with Potts scissors.  The 4 mm end of the Gore-Tex graft was spatulated and then anastomosed end-to-side to the brachial arteriotomy using continuous running suture of 6-0 Prolene.  The anastomosis was completed and hemostasis ensured.  The graft was clamped to restore perfusion to the hand.   The brachial vein was clamped proximally and distally.  An anterior venotomy was made with an 11 blade.  This was extended with Potts scissors.  The 7 mm end of the Gore-Tex graft was then anastomosed end to side to the brachial vein venotomy using continuous running suture of 6-0 Prolene.  Immediately prior to completion the anastomosis was de-aired and flushed.  Anastomosis was then completed.  Hemostasis was insured.   Doppler machine was brought onto the field to interrogate the graft. A weakly palpable pulse was noted in the radial artery.  About the arterial anastomosis flow was noted proximal and distal to the arterial anastomosis.  Distal to the venous anastomosis a Doppler bruit was heard.  Satisfied we ended the case here.   Surgical beds were irrigated  copiously.  Hemostasis was again ensured in the surgical beds.  The wounds were closed in layers using 3-0 Vicryl and 4-0 Monocryl.  Dermabond was applied.  Upon completion of the case instrument and sharps counts were confirmed  correct. The patient was transferred to the PACU in good condition. I was present for all portions of the procedure.  Yevonne Aline. Stanford Breed, MD [facility] [place] [facility] Phone Number: [phone] 09/14/2021 11:11 AM

## 2021-09-14 NOTE — Progress Notes (Signed)
Patient c/o pain and SOB. Anesthesia unable to see patient at this time.

## 2021-09-15 ENCOUNTER — Encounter (HOSPITAL_COMMUNITY): Payer: Self-pay | Admitting: Vascular Surgery

## 2021-09-18 ENCOUNTER — Emergency Department (HOSPITAL_COMMUNITY): Payer: Medicare Other

## 2021-09-18 ENCOUNTER — Inpatient Hospital Stay (HOSPITAL_COMMUNITY)
Admission: EM | Admit: 2021-09-18 | Discharge: 2021-09-21 | DRG: 190 | Disposition: A | Discharge status: Home / Self Care | Payer: Medicare Other | Attending: Student | Admitting: Student

## 2021-09-18 ENCOUNTER — Other Ambulatory Visit: Payer: Self-pay

## 2021-09-18 ENCOUNTER — Encounter (HOSPITAL_COMMUNITY): Payer: Self-pay

## 2021-09-18 DIAGNOSIS — E43 Unspecified severe protein-calorie malnutrition: Secondary | ICD-10-CM | POA: Diagnosis present

## 2021-09-18 DIAGNOSIS — K5909 Other constipation: Secondary | ICD-10-CM | POA: Diagnosis present

## 2021-09-18 DIAGNOSIS — G894 Chronic pain syndrome: Secondary | ICD-10-CM | POA: Diagnosis not present

## 2021-09-18 DIAGNOSIS — J441 Chronic obstructive pulmonary disease with (acute) exacerbation: Secondary | ICD-10-CM | POA: Diagnosis present

## 2021-09-18 DIAGNOSIS — G43909 Migraine, unspecified, not intractable, without status migrainosus: Secondary | ICD-10-CM | POA: Diagnosis present

## 2021-09-18 DIAGNOSIS — K5900 Constipation, unspecified: Secondary | ICD-10-CM | POA: Diagnosis not present

## 2021-09-18 DIAGNOSIS — D631 Anemia in chronic kidney disease: Secondary | ICD-10-CM | POA: Diagnosis present

## 2021-09-18 DIAGNOSIS — R109 Unspecified abdominal pain: Secondary | ICD-10-CM | POA: Diagnosis not present

## 2021-09-18 DIAGNOSIS — E785 Hyperlipidemia, unspecified: Secondary | ICD-10-CM | POA: Diagnosis present

## 2021-09-18 DIAGNOSIS — R1013 Epigastric pain: Secondary | ICD-10-CM | POA: Diagnosis not present

## 2021-09-18 DIAGNOSIS — K580 Irritable bowel syndrome with diarrhea: Secondary | ICD-10-CM | POA: Diagnosis present

## 2021-09-18 DIAGNOSIS — N2581 Secondary hyperparathyroidism of renal origin: Secondary | ICD-10-CM | POA: Diagnosis present

## 2021-09-18 DIAGNOSIS — I739 Peripheral vascular disease, unspecified: Secondary | ICD-10-CM | POA: Diagnosis present

## 2021-09-18 DIAGNOSIS — K921 Melena: Secondary | ICD-10-CM | POA: Diagnosis not present

## 2021-09-18 DIAGNOSIS — H409 Unspecified glaucoma: Secondary | ICD-10-CM | POA: Diagnosis present

## 2021-09-18 DIAGNOSIS — Z992 Dependence on renal dialysis: Secondary | ICD-10-CM

## 2021-09-18 DIAGNOSIS — G8929 Other chronic pain: Secondary | ICD-10-CM | POA: Diagnosis present

## 2021-09-18 DIAGNOSIS — Z8249 Family history of ischemic heart disease and other diseases of the circulatory system: Secondary | ICD-10-CM

## 2021-09-18 DIAGNOSIS — Z79899 Other long term (current) drug therapy: Secondary | ICD-10-CM

## 2021-09-18 DIAGNOSIS — R7401 Elevation of levels of liver transaminase levels: Secondary | ICD-10-CM | POA: Diagnosis present

## 2021-09-18 DIAGNOSIS — E8770 Fluid overload, unspecified: Secondary | ICD-10-CM | POA: Diagnosis present

## 2021-09-18 DIAGNOSIS — I5033 Acute on chronic diastolic (congestive) heart failure: Secondary | ICD-10-CM | POA: Diagnosis present

## 2021-09-18 DIAGNOSIS — Z681 Body mass index (BMI) 19 or less, adult: Secondary | ICD-10-CM | POA: Diagnosis not present

## 2021-09-18 DIAGNOSIS — K219 Gastro-esophageal reflux disease without esophagitis: Secondary | ICD-10-CM | POA: Diagnosis present

## 2021-09-18 DIAGNOSIS — Z91199 Patient's noncompliance with other medical treatment and regimen due to unspecified reason: Secondary | ICD-10-CM

## 2021-09-18 DIAGNOSIS — Z91041 Radiographic dye allergy status: Secondary | ICD-10-CM

## 2021-09-18 DIAGNOSIS — D62 Acute posthemorrhagic anemia: Secondary | ICD-10-CM | POA: Diagnosis present

## 2021-09-18 DIAGNOSIS — N186 End stage renal disease: Secondary | ICD-10-CM | POA: Diagnosis present

## 2021-09-18 DIAGNOSIS — Z7951 Long term (current) use of inhaled steroids: Secondary | ICD-10-CM

## 2021-09-18 DIAGNOSIS — Z20822 Contact with and (suspected) exposure to covid-19: Secondary | ICD-10-CM | POA: Diagnosis present

## 2021-09-18 DIAGNOSIS — K589 Irritable bowel syndrome without diarrhea: Secondary | ICD-10-CM | POA: Diagnosis not present

## 2021-09-18 DIAGNOSIS — I132 Hypertensive heart and chronic kidney disease with heart failure and with stage 5 chronic kidney disease, or end stage renal disease: Secondary | ICD-10-CM | POA: Diagnosis present

## 2021-09-18 DIAGNOSIS — J449 Chronic obstructive pulmonary disease, unspecified: Secondary | ICD-10-CM | POA: Diagnosis present

## 2021-09-18 DIAGNOSIS — Z881 Allergy status to other antibiotic agents status: Secondary | ICD-10-CM

## 2021-09-18 DIAGNOSIS — F32A Depression, unspecified: Secondary | ICD-10-CM | POA: Diagnosis present

## 2021-09-18 DIAGNOSIS — K551 Chronic vascular disorders of intestine: Secondary | ICD-10-CM | POA: Diagnosis present

## 2021-09-18 DIAGNOSIS — Z885 Allergy status to narcotic agent status: Secondary | ICD-10-CM

## 2021-09-18 DIAGNOSIS — R1084 Generalized abdominal pain: Secondary | ICD-10-CM | POA: Diagnosis not present

## 2021-09-18 DIAGNOSIS — Z955 Presence of coronary angioplasty implant and graft: Secondary | ICD-10-CM

## 2021-09-18 DIAGNOSIS — Z8541 Personal history of malignant neoplasm of cervix uteri: Secondary | ICD-10-CM

## 2021-09-18 DIAGNOSIS — Z888 Allergy status to other drugs, medicaments and biological substances status: Secondary | ICD-10-CM | POA: Diagnosis not present

## 2021-09-18 DIAGNOSIS — R195 Other fecal abnormalities: Secondary | ICD-10-CM | POA: Diagnosis not present

## 2021-09-18 DIAGNOSIS — F419 Anxiety disorder, unspecified: Secondary | ICD-10-CM | POA: Diagnosis present

## 2021-09-18 DIAGNOSIS — D500 Iron deficiency anemia secondary to blood loss (chronic): Secondary | ICD-10-CM | POA: Diagnosis not present

## 2021-09-18 DIAGNOSIS — Z886 Allergy status to analgesic agent status: Secondary | ICD-10-CM | POA: Diagnosis not present

## 2021-09-18 DIAGNOSIS — Z87891 Personal history of nicotine dependence: Secondary | ICD-10-CM

## 2021-09-18 DIAGNOSIS — J9621 Acute and chronic respiratory failure with hypoxia: Secondary | ICD-10-CM | POA: Diagnosis present

## 2021-09-18 DIAGNOSIS — Z85828 Personal history of other malignant neoplasm of skin: Secondary | ICD-10-CM

## 2021-09-18 DIAGNOSIS — I2510 Atherosclerotic heart disease of native coronary artery without angina pectoris: Secondary | ICD-10-CM | POA: Diagnosis present

## 2021-09-18 DIAGNOSIS — Z862 Personal history of diseases of the blood and blood-forming organs and certain disorders involving the immune mechanism: Secondary | ICD-10-CM | POA: Diagnosis not present

## 2021-09-18 DIAGNOSIS — Z9189 Other specified personal risk factors, not elsewhere classified: Secondary | ICD-10-CM | POA: Diagnosis not present

## 2021-09-18 DIAGNOSIS — I252 Old myocardial infarction: Secondary | ICD-10-CM

## 2021-09-18 LAB — COMPREHENSIVE METABOLIC PANEL
ALT: 50 U/L — ABNORMAL HIGH (ref 0–44)
AST: 111 U/L — ABNORMAL HIGH (ref 15–41)
Albumin: 2.6 g/dL — ABNORMAL LOW (ref 3.5–5.0)
Alkaline Phosphatase: 189 U/L — ABNORMAL HIGH (ref 38–126)
Anion gap: 12 (ref 5–15)
BUN: 40 mg/dL — ABNORMAL HIGH (ref 6–20)
CO2: 24 mmol/L (ref 22–32)
Calcium: 8.7 mg/dL — ABNORMAL LOW (ref 8.9–10.3)
Chloride: 98 mmol/L (ref 98–111)
Creatinine, Ser: 4.73 mg/dL — ABNORMAL HIGH (ref 0.44–1.00)
GFR, Estimated: 10 mL/min — ABNORMAL LOW (ref >60)
Glucose, Bld: 125 mg/dL — ABNORMAL HIGH (ref 70–99)
Potassium: 4.7 mmol/L (ref 3.5–5.1)
Sodium: 134 mmol/L — ABNORMAL LOW (ref 135–145)
Total Bilirubin: 0.9 mg/dL (ref 0.3–1.2)
Total Protein: 6.2 g/dL — ABNORMAL LOW (ref 6.5–8.1)

## 2021-09-18 LAB — CBC WITH DIFFERENTIAL/PLATELET
Abs Immature Granulocytes: 0.2 10*3/uL — ABNORMAL HIGH (ref 0.00–0.07)
Basophils Absolute: 0.1 10*3/uL (ref 0.0–0.1)
Basophils Relative: 1 %
Eosinophils Absolute: 0 10*3/uL (ref 0.0–0.5)
Eosinophils Relative: 0 %
HCT: 26.2 % — ABNORMAL LOW (ref 36.0–46.0)
Hemoglobin: 8.2 g/dL — ABNORMAL LOW (ref 12.0–15.0)
Lymphocytes Relative: 11 %
Lymphs Abs: 1.2 10*3/uL (ref 0.7–4.0)
MCH: 30.8 pg (ref 26.0–34.0)
MCHC: 31.3 g/dL (ref 30.0–36.0)
MCV: 98.5 fL (ref 80.0–100.0)
Metamyelocytes Relative: 1 %
Monocytes Absolute: 0.6 10*3/uL (ref 0.1–1.0)
Monocytes Relative: 6 %
Myelocytes: 1 %
Neutro Abs: 8.4 10*3/uL — ABNORMAL HIGH (ref 1.7–7.7)
Neutrophils Relative %: 80 %
Platelets: 392 10*3/uL (ref 150–400)
RBC: 2.66 MIL/uL — ABNORMAL LOW (ref 3.87–5.11)
RDW: 18.7 % — ABNORMAL HIGH (ref 11.5–15.5)
Smear Review: NORMAL
WBC: 10.5 10*3/uL (ref 4.0–10.5)
nRBC: 2 /100 WBC — ABNORMAL HIGH

## 2021-09-18 LAB — IRON AND TIBC
Iron: 60 ug/dL (ref 28–170)
Saturation Ratios: 22 % (ref 10.4–31.8)
TIBC: 270 ug/dL (ref 250–450)
UIBC: 210 ug/dL

## 2021-09-18 LAB — LACTIC ACID, PLASMA
Lactic Acid, Venous: 1 mmol/L (ref 0.5–1.9)
Lactic Acid, Venous: 1.4 mmol/L (ref 0.5–1.9)

## 2021-09-18 LAB — LIPASE, BLOOD: Lipase: 34 U/L (ref 11–51)

## 2021-09-18 LAB — RESP PANEL BY RT-PCR (FLU A&B, COVID) ARPGX2
Influenza A by PCR: NEGATIVE
Influenza B by PCR: NEGATIVE
SARS Coronavirus 2 by RT PCR: NEGATIVE

## 2021-09-18 LAB — FERRITIN: Ferritin: 1304 ng/mL — ABNORMAL HIGH (ref 11–307)

## 2021-09-18 LAB — HEPATITIS B SURFACE ANTIGEN: Hepatitis B Surface Ag: NONREACTIVE

## 2021-09-18 LAB — RETICULOCYTES
Immature Retic Fract: 43.8 % — ABNORMAL HIGH (ref 2.3–15.9)
RBC.: 2.49 MIL/uL — ABNORMAL LOW (ref 3.87–5.11)
Retic Count, Absolute: 145.2 10*3/uL (ref 19.0–186.0)
Retic Ct Pct: 5.8 % — ABNORMAL HIGH (ref 0.4–3.1)

## 2021-09-18 LAB — HEMOGLOBIN AND HEMATOCRIT, BLOOD
HCT: 24.5 % — ABNORMAL LOW (ref 36.0–46.0)
Hemoglobin: 8 g/dL — ABNORMAL LOW (ref 12.0–15.0)

## 2021-09-18 LAB — HEPATITIS B SURFACE ANTIBODY,QUALITATIVE: Hep B S Ab: NONREACTIVE

## 2021-09-18 LAB — HIV ANTIBODY (ROUTINE TESTING W REFLEX): HIV Screen 4th Generation wRfx: NONREACTIVE

## 2021-09-18 MED ORDER — AZELASTINE HCL 0.1 % NA SOLN: 1.0000 | Freq: Every day | NASAL | Status: DC | PRN

## 2021-09-18 MED ORDER — ALTEPLASE 2 MG IJ SOLR: 2.0000 mg | Freq: Once | INTRAMUSCULAR | Status: DC | PRN

## 2021-09-18 MED ORDER — LABETALOL HCL 100 MG PO TABS
100.0000 mg | ORAL_TABLET | Freq: Two times a day (BID) | ORAL | Status: DC
  Administered 2021-09-18 – 2021-09-21 (×6): 100 mg via ORAL
  Filled 2021-09-18 (×6): qty 1

## 2021-09-18 MED ORDER — MOMETASONE FURO-FORMOTEROL FUM 100-5 MCG/ACT IN AERO
2.0000 | INHALATION_SPRAY | Freq: Two times a day (BID) | RESPIRATORY_TRACT | Status: DC
  Administered 2021-09-19 – 2021-09-21 (×3): 2 via RESPIRATORY_TRACT
  Filled 2021-09-18 (×2): qty 8.8

## 2021-09-18 MED ORDER — LORATADINE 10 MG PO TABS
10.0000 mg | ORAL_TABLET | Freq: Every day | ORAL | Status: DC
  Administered 2021-09-19 – 2021-09-21 (×3): 10 mg via ORAL
  Filled 2021-09-18 (×3): qty 1

## 2021-09-18 MED ORDER — HYDROMORPHONE HCL 1 MG/ML IJ SOLN
0.5000 mg | Freq: Once | INTRAMUSCULAR | Status: AC
  Administered 2021-09-18: 16:00:00 0.5 mg via INTRAVENOUS
  Filled 2021-09-18: qty 1

## 2021-09-18 MED ORDER — SODIUM CHLORIDE 0.9 % IV SOLN: 100.0000 mL | INTRAVENOUS | Status: DC | PRN

## 2021-09-18 MED ORDER — ISOSORBIDE MONONITRATE ER 30 MG PO TB24
30.0000 mg | ORAL_TABLET | Freq: Every day | ORAL | Status: DC
  Administered 2021-09-19 – 2021-09-21 (×3): 30 mg via ORAL
  Filled 2021-09-18 (×3): qty 1

## 2021-09-18 MED ORDER — ALBUTEROL SULFATE (2.5 MG/3ML) 0.083% IN NEBU
3.0000 mL | INHALATION_SOLUTION | Freq: Four times a day (QID) | RESPIRATORY_TRACT | Status: DC | PRN
  Filled 2021-09-18: qty 3

## 2021-09-18 MED ORDER — IPRATROPIUM-ALBUTEROL 0.5-2.5 (3) MG/3ML IN SOLN
3.0000 mL | Freq: Four times a day (QID) | RESPIRATORY_TRACT | Status: DC
  Administered 2021-09-18: 23:00:00 3 mL via RESPIRATORY_TRACT
  Filled 2021-09-18: qty 3

## 2021-09-18 MED ORDER — IPRATROPIUM-ALBUTEROL 0.5-2.5 (3) MG/3ML IN SOLN
3.0000 mL | Freq: Four times a day (QID) | RESPIRATORY_TRACT | Status: DC
  Administered 2021-09-19 – 2021-09-20 (×5): 3 mL via RESPIRATORY_TRACT
  Filled 2021-09-18 (×6): qty 3

## 2021-09-18 MED ORDER — VITAMIN B-12 1000 MCG PO TABS
500.0000 ug | ORAL_TABLET | Freq: Every day | ORAL | Status: DC
  Administered 2021-09-19 – 2021-09-21 (×3): 500 ug via ORAL
  Filled 2021-09-18 (×3): qty 1

## 2021-09-18 MED ORDER — OYSTER SHELL CALCIUM/D3 500-5 MG-MCG PO TABS
1.0000 | ORAL_TABLET | Freq: Every day | ORAL | Status: DC
  Administered 2021-09-19 – 2021-09-21 (×3): 1 via ORAL
  Filled 2021-09-18 (×3): qty 1

## 2021-09-18 MED ORDER — SODIUM CHLORIDE 0.9 % IV SOLN: 500.0000 mg | INTRAVENOUS | Status: DC

## 2021-09-18 MED ORDER — PANTOPRAZOLE SODIUM 40 MG PO TBEC
40.0000 mg | DELAYED_RELEASE_TABLET | Freq: Every day | ORAL | Status: DC
  Administered 2021-09-19 – 2021-09-21 (×3): 40 mg via ORAL
  Filled 2021-09-18 (×3): qty 1

## 2021-09-18 MED ORDER — LINACLOTIDE 72 MCG PO CAPS
72.0000 ug | ORAL_CAPSULE | Freq: Every day | ORAL | Status: DC
  Administered 2021-09-19 – 2021-09-20 (×2): 72 ug via ORAL
  Filled 2021-09-18 (×3): qty 1

## 2021-09-18 MED ORDER — TRAZODONE HCL 50 MG PO TABS
50.0000 mg | ORAL_TABLET | Freq: Every evening | ORAL | Status: DC | PRN
  Administered 2021-09-18: 23:00:00 50 mg via ORAL
  Administered 2021-09-20: 23:00:00 100 mg via ORAL
  Filled 2021-09-18: qty 1
  Filled 2021-09-18: qty 2

## 2021-09-18 MED ORDER — PREDNISONE 20 MG PO TABS
40.0000 mg | ORAL_TABLET | Freq: Every day | ORAL | Status: DC
  Administered 2021-09-19 – 2021-09-21 (×3): 40 mg via ORAL
  Filled 2021-09-18 (×3): qty 2

## 2021-09-18 MED ORDER — CALCIUM CARB-CHOLECALCIFEROL 600-5 MG-MCG PO TABS: ORAL_TABLET | Freq: Every day | ORAL | Status: DC

## 2021-09-18 MED ORDER — PROMETHAZINE HCL 25 MG PO TABS
25.0000 mg | ORAL_TABLET | Freq: Every day | ORAL | Status: DC
  Administered 2021-09-19 – 2021-09-21 (×3): 25 mg via ORAL
  Filled 2021-09-18 (×3): qty 1

## 2021-09-18 MED ORDER — OXYCODONE-ACETAMINOPHEN 5-325 MG PO TABS
1.0000 | ORAL_TABLET | Freq: Two times a day (BID) | ORAL | Status: DC | PRN
  Administered 2021-09-18 – 2021-09-20 (×3): 1 via ORAL
  Filled 2021-09-18 (×4): qty 1

## 2021-09-18 MED ORDER — LIDOCAINE-PRILOCAINE 2.5-2.5 % EX CREA: 1.0000 "application " | TOPICAL_CREAM | CUTANEOUS | Status: DC | PRN

## 2021-09-18 MED ORDER — ENSURE ENLIVE PO LIQD
237.0000 mL | Freq: Two times a day (BID) | ORAL | Status: DC
  Administered 2021-09-19 – 2021-09-21 (×5): 237 mL via ORAL

## 2021-09-18 MED ORDER — MEGESTROL ACETATE 40 MG PO TABS
40.0000 mg | ORAL_TABLET | Freq: Every day | ORAL | Status: DC
  Administered 2021-09-19 – 2021-09-21 (×3): 40 mg via ORAL
  Filled 2021-09-18 (×3): qty 1

## 2021-09-18 MED ORDER — ONDANSETRON 4 MG PO TBDP: 4.0000 mg | ORAL_TABLET | Freq: Three times a day (TID) | ORAL | Status: DC | PRN

## 2021-09-18 MED ORDER — CHLORHEXIDINE GLUCONATE CLOTH 2 % EX PADS
6.0000 | MEDICATED_PAD | Freq: Every day | CUTANEOUS | Status: DC
  Administered 2021-09-19 – 2021-09-21 (×3): 6 via TOPICAL

## 2021-09-18 MED ORDER — AMLODIPINE BESYLATE 10 MG PO TABS
10.0000 mg | ORAL_TABLET | Freq: Every day | ORAL | Status: DC
  Administered 2021-09-19 – 2021-09-21 (×3): 10 mg via ORAL
  Filled 2021-09-18 (×3): qty 1

## 2021-09-18 MED ORDER — LIDOCAINE HCL (PF) 1 % IJ SOLN: 5.0000 mL | INTRAMUSCULAR | Status: DC | PRN

## 2021-09-18 MED ORDER — AZITHROMYCIN 250 MG PO TABS: 500.0000 mg | ORAL_TABLET | Freq: Every day | ORAL | Status: DC

## 2021-09-18 MED ORDER — ALPRAZOLAM 0.5 MG PO TABS
1.0000 mg | ORAL_TABLET | Freq: Two times a day (BID) | ORAL | Status: DC | PRN
  Administered 2021-09-19 – 2021-09-20 (×2): 1 mg via ORAL
  Filled 2021-09-18 (×2): qty 2

## 2021-09-18 MED ORDER — CALCIUM ACETATE (PHOS BINDER) 667 MG PO CAPS
667.0000 mg | ORAL_CAPSULE | ORAL | Status: DC
  Administered 2021-09-18 – 2021-09-21 (×5): 667 mg via ORAL
  Filled 2021-09-18: qty 1

## 2021-09-18 MED ORDER — CALCIUM ACETATE (PHOS BINDER) 667 MG PO CAPS
2001.0000 mg | ORAL_CAPSULE | Freq: Three times a day (TID) | ORAL | Status: DC
  Administered 2021-09-19 – 2021-09-20 (×4): 2001 mg via ORAL
  Administered 2021-09-20: 13:00:00 667 mg via ORAL
  Administered 2021-09-21 (×2): 2001 mg via ORAL
  Filled 2021-09-18 (×8): qty 3

## 2021-09-18 MED ORDER — HEPARIN SODIUM (PORCINE) 1000 UNIT/ML DIALYSIS
1000.0000 [IU] | INTRAMUSCULAR | Status: DC | PRN
  Filled 2021-09-18 (×2): qty 1

## 2021-09-18 MED ORDER — VENLAFAXINE HCL ER 75 MG PO CP24
300.0000 mg | ORAL_CAPSULE | Freq: Every day | ORAL | Status: DC
  Administered 2021-09-19 – 2021-09-21 (×3): 300 mg via ORAL
  Filled 2021-09-18 (×3): qty 4

## 2021-09-18 MED ORDER — CALCIUM ACETATE (PHOS BINDER) 667 MG PO CAPS
2001.0000 mg | ORAL_CAPSULE | ORAL | Status: DC
Start: 2021-09-18 — End: 2021-09-18

## 2021-09-18 MED ORDER — PENTAFLUOROPROP-TETRAFLUOROETH EX AERO: 1.0000 "application " | INHALATION_SPRAY | CUTANEOUS | Status: DC | PRN

## 2021-09-18 NOTE — ED Provider Notes (Signed)
[facility] EMERGENCY DEPARTMENT Provider Note   CSN: [id number] Arrival date & time: 09/18/21  1256     History Chief Complaint  Patient presents with   Shortness of Breath    [name] is a 60 y.o. female with a past medical history significant for COPD, chronic kidney disease with recent progression to ESRD who is been on dialysis for just 1 month, asthma, long history of abdominal pain, and rectal bleeding who presents with worsening shortness of breath for the last 3 to 4 weeks.  Patient reports that she thinks it was secondary to quitting smoking about a month ago.  Patient had worsening shortness of breath today, was brought in by EMS, O2 sats at 65% on initial evaluation.  Patient has received albuterol, Atrovent, Solu-Medrol, and magnesium prior to arrival.  Patient reports that she is a Monday Wednesday Friday dialysis patient, and missed her appointment today.  Patient denies any confusion, chest pain, worsening of her abdominal pain.   Shortness of Breath     Past Medical History:  Diagnosis Date   Anemia 2008   Anginal pain ([place])    Anxiety    Aortic stenosis    abominal aorta, s/p distal aortic stent 09/28/18, 12/01/20   Arthritis    "all my joints ache" (09/07/2014)   Asthma    Bipolar 1 disorder ([place])    Blood transfusion without reported diagnosis    Bradycardia    Bruit    Carpal tunnel syndrome    Cataract    forming   Cervical cancer ([place]) 1985   CHF (congestive heart failure) ([place])    Chronic kidney disease (CKD), stage V ([place])    COPD (chronic obstructive pulmonary disease) ([place]) 2000   Coronary artery disease    Daily headache    Depression 2000   Diverticulitis 2008   Emphysema of lung (HCC)    GERD (gastroesophageal reflux disease)    Glaucoma    Heart murmur    History of colon polyps 2009   HLD (hyperlipidemia) 2013   Hypertension 2013   Hypovitaminosis D    IBS (irritable bowel syndrome) 2008   Myocardial  infarction [facility])    2015   PAD (peripheral artery disease) ([place])    Pancreatitis 10/2011   Pneumonia 07/2014   RLS (restless legs syndrome)    Schizophrenia (HCC)    Shortness of breath    Skin cancer    Small bowel obstruction ([place]) 2008   Thyroid disease     Patient Active Problem List   Diagnosis Date Noted   Acute on chronic anemia    Nausea without vomiting    Acute hypoxemic respiratory failure ([place]) 08/16/2021   Dark stools    Diverticulosis    Adverse reaction to antiplatelet agent, sequela    AKI (acute kidney injury) ([place]) 05/11/2021   Chest pain, rule out acute myocardial infarction 05/09/2021   Abnormal EKG    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[place]) 12/30/2020   Chronic mesenteric ischemia ([place]) 12/28/2020   Polyp of ascending colon    AVM (arteriovenous malformation) of small bowel, acquired with hemorrhage    GIB (gastrointestinal bleeding) 08/23/2020   Hematochezia    Diverticulosis of colon without hemorrhage    Pain of upper abdomen    Angiodysplasia of colon 08/16/2020   Acute GI bleeding 08/05/2020   Acute blood loss anemia 08/05/2020   Renal artery stenosis ([place]) 05/10/2020   Abdominal aortic stenosis 11/30/2018   Pure hypercholesterolemia 10/22/2018  Abnormal stress test 03/21/2018   At high risk for injury related to fall 12/26/2017   Renal artery stenosis, native, bilateral ([place]) 10/27/2017   Benign neoplasm of transverse colon    Benign neoplasm of descending colon    Benign neoplasm of sigmoid colon    Abnormality on screening test 06/22/2016   Hypertensive urgency 11/01/2015   Chest pain 11/01/2015   History of partial colectomy 10/19/2014   Renovascular hypertension, malignant 09/07/2014   Anemia, chronic disease 07/30/2014   Community acquired pneumonia 07/02/2014   Heart failure with preserved ejection fraction ([place]) 07/02/2014   COPD GOLD 0 / still smoking 07/02/2014   CKD (chronic kidney disease) stage 4, GFR 15-29 ml/min ([place]) 07/02/2014    Protein-calorie malnutrition, severe ([place]) 07/02/2014   Hyperparathyroidism, secondary renal ([place]) 04/30/2014   Postsurgical menopause 08/14/2013   Vaginal atrophy 08/14/2013   Screening for breast cancer 07/21/2012   Diverticulitis of colon with perforation 06/18/2012   Affective bipolar disorder ([place]) 02/02/2009   ADHESIONS, INTESTINAL W/OBSTRUCTION 08/10/2008   Irritable bowel syndrome 08/10/2008   Abdominal pain, generalized 08/10/2008   FECAL OCCULT BLOOD 08/10/2008   Anemia, iron deficiency 08/10/2008   Anxiety state 08/09/2008   DEPRESSION 08/09/2008   ASTHMA 08/09/2008   ESOPHAGEAL STRICTURE 08/09/2008   GERD 08/09/2008   HIATAL HERNIA 08/09/2008   Diverticulosis of large intestine 08/09/2008   ARTHRITIS 08/09/2008   HEADACHE, CHRONIC 08/09/2008   SKIN CANCER, HX OF 08/09/2008   COLONIC POLYPS, HYPERPLASTIC, HX OF 08/09/2008   Cigarette smoker 05/17/2008   Schizophrenia ([place]) 04/30/2008    Past Surgical History:  Procedure Laterality Date   ABDOMINAL ANGIOGRAM N/A 09/07/2014   Procedure: ABDOMINAL ANGIOGRAM;  Surgeon: Laverda Page, MD;  Location: [facility] CATH LAB;  Service: Cardiovascular;  Laterality: N/A;   ABDOMINAL AORTOGRAM W/LOWER EXTREMITY N/A 12/28/2020   Procedure: ABDOMINAL AORTOGRAM W/LOWER EXTREMITY;  Surgeon: Cherre Robins, MD;  Location: [facility] CV LAB;  Service: Cardiovascular;  Laterality: N/A;   APPENDECTOMY  10/01/1994   AV FISTULA PLACEMENT Left 09/14/2021   Procedure: INSERTION OF ARTERIOVENOUS LEFT UPPER GORE-TEX GRAFT ARM;  Surgeon: Cherre Robins, MD;  Location: [facility] OR;  Service: Vascular;  Laterality: Left;   BIOPSY  08/06/2020   Procedure: BIOPSY;  Surgeon: Jackquline Denmark, MD;  Location: [facility] ENDOSCOPY;  Service: Endoscopy;;   COLON SURGERY  10/01/2006   6 inches of colon removed due to obstruction   COLONOSCOPY WITH PROPOFOL N/A 10/25/2016   Procedure: COLONOSCOPY WITH PROPOFOL;  Surgeon: Milus Banister, MD;  Location: [facility] ENDOSCOPY;   Service: Endoscopy;  Laterality: N/A;   COLONOSCOPY WITH PROPOFOL N/A 08/07/2020   Procedure: COLONOSCOPY WITH PROPOFOL;  Surgeon: Jackquline Denmark, MD;  Location: [facility]OS[facility];  Service: Endoscopy;  Laterality: N/A;   COLONOSCOPY WITH PROPOFOL N/A 08/27/2020   Procedure: COLONOSCOPY WITH PROPOFOL;  Surgeon: Lavena Bullion, DO;  Location: [facility] ENDOSCOPY;  Service: Gastroenterology;  Laterality: N/A;   CORONARY ANGIOPLASTY WITH STENT PLACEMENT  09/07/2014   "2"   ENTEROSCOPY N/A 08/27/2020   Procedure: ENTEROSCOPY;  Surgeon: Lavena Bullion, DO;  Location: [facility]OS[facility];  Service: Gastroenterology;  Laterality: N/A;  Push enteroscopy    ENTEROSCOPY N/A 06/12/2021   Procedure: ENTEROSCOPY;  Surgeon: Rush Landmark Telford Nab., MD;  Location: [facility] ENDOS[facility];  Service: Gastroenterology;  Laterality: N/A;   ENTEROSCOPY N/A 08/18/2021   Procedure: ENTEROSCOPY;  Surgeon: Jerene Bears, MD;  [facility]: [facility]OS[facility];  Service: Gastroenterology;  Laterality: N/A;   ESOPHAGOGASTRODUODENOSCOPY (EGD) WITH PROPOFOL N/A 08/06/2020  Procedure: ESOPHAGOGASTRODUODENOSCOPY (EGD) WITH PROPOFOL;  Surgeon: Jackquline Denmark, MD;  [facility]: [facility];  Service: Endoscopy;  Laterality: N/A;   GIVENS CAPSULE STUDY N/A 08/24/2020   Procedure: GIVENS CAPSULE STUDY;  Surgeon: Lavena Bullion, DO;  [facility]: [facility];  Service: Gastroenterology;  Laterality: N/A;   GIVENS CAPSULE STUDY N/A 08/18/2021   Procedure: GIVENS CAPSULE;  Surgeon: Jerene Bears, MD;  [facility]: [facility];  Service: Gastroenterology;  Laterality: N/A;   HOT HEMOSTASIS N/A 08/07/2020   Procedure: HOT HEMOSTASIS (ARGON PLASMA COAGULATION/BICAP);  Surgeon: Jackquline Denmark, MD;  Location: [facility];  Service: Endoscopy;  Laterality: N/A;   HOT HEMOSTASIS N/A 08/27/2020   Procedure: HOT HEMOSTASIS (ARGON PLASMA COAGULATION/BICAP);  Surgeon: Lavena Bullion, DO;  [facility];  Service: Gastroenterology;  Laterality: N/A;   IR FLUORO  GUIDE CV LINE RIGHT  08/21/2021   IR US GUIDE VASC ACCESS RIGHT  08/21/2021   LEFT HEART CATH AND CORONARY ANGIOGRAPHY N/A 03/25/2018   Procedure: LEFT HEART CATH AND CORONARY ANGIOGRAPHY;  Surgeon: Nigel Mormon, MD;  Location: [facility] CV LAB;  Service: Cardiovascular;  Laterality: N/A;   LEFT HEART CATH AND CORONARY ANGIOGRAPHY N/A 05/09/2021   Procedure: LEFT HEART CATH AND CORONARY ANGIOGRAPHY;  Surgeon: Adrian Prows, MD;  Location: [facility] CV LAB;  Service: Cardiovascular;  Laterality: N/A;   LEFT HEART CATHETERIZATION WITH CORONARY ANGIOGRAM N/A 09/07/2014   Procedure: LEFT HEART CATHETERIZATION WITH CORONARY ANGIOGRAM;  Surgeon: Laverda Page, MD;  Location: [facility] CATH LAB;  Service: Cardiovascular;  Laterality: N/A;   LOWER EXTREMITY ANGIOGRAPHY  10/29/2017   Procedure: Lower Extremity Angiography;  Surgeon: Adrian Prows, MD;  Location: [facility] CV LAB;  Service: Cardiovascular;;   PERIPHERAL VASCULAR CATHETERIZATION N/A 11/01/2015   Procedure: Renal Angiography;  Surgeon: Adrian Prows, MD;  Location: [facility] CV LAB;  Service: Cardiovascular;  Laterality: N/A;   PERIPHERAL VASCULAR CATHETERIZATION N/A 04/10/2016   Procedure: Renal Angiography;  Surgeon: Adrian Prows, MD;  Location: [facility] CV LAB;  Service: Cardiovascular;  Laterality: N/A;   PERIPHERAL VASCULAR CATHETERIZATION  04/10/2016   Procedure: Peripheral Vascular Intervention;  Surgeon: Adrian Prows, MD;  Location: [facility] CV LAB;  Service: Cardiovascular;;   PERIPHERAL VASCULAR INTERVENTION  10/29/2017   Procedure: PERIPHERAL VASCULAR INTERVENTION;  Surgeon: Adrian Prows, MD;  Location: [facility] CV LAB;  Service: Cardiovascular;;   PERIPHERAL VASCULAR INTERVENTION Bilateral 12/28/2020   Procedure: PERIPHERAL VASCULAR INTERVENTION;  Surgeon: Cherre Robins, MD;  Location: [facility] CV LAB;  Service: Cardiovascular;  Laterality: Bilateral;   POLYPECTOMY  08/07/2020   Procedure: POLYPECTOMY;  Surgeon: Jackquline Denmark, MD;  [facility];  Service: Endoscopy;;   POLYPECTOMY  08/27/2020   Procedure: POLYPECTOMY;  Surgeon: Lavena Bullion, DO;  [facility]: [facility];  Service: Gastroenterology;;   POLYPECTOMY     RENAL ANGIOGRAPHY N/A 10/29/2017   Procedure: RENAL ANGIOGRAPHY;  Surgeon: Adrian Prows, MD;  Location: [facility] CV LAB;  Service: Cardiovascular;  Laterality: N/A;   RENAL ANGIOGRAPHY N/A 05/10/2020   Procedure: RENAL ANGIOGRAPHY;  Surgeon: Nigel Mormon, MD;  Location: [facility] CV LAB;  Service: Cardiovascular;  Laterality: N/A;   RIGHT OOPHORECTOMY Right 10/01/1994   SUBMUCOSAL TATTOO INJECTION  06/12/2021   Procedure: SUBMUCOSAL TATTOO INJECTION;  Surgeon: Irving Copas., MD;  Location: [facility];  Service: Gastroenterology;;   TOTAL ABDOMINAL HYSTERECTOMY  10/02/1995   UPPER GASTROINTESTINAL ENDOSCOPY       OB History     Gravida  2   Para      Term      Preterm      AB  1   Living  1      SAB      IAB      Ectopic  1   Multiple      Live Births  1           Family History  Problem Relation Age of Onset   Other Mother        many bowel obstructions   Heart disease Mother    Colon polyps Mother    Kidney cancer Father    Bone cancer Father    Diabetes Father    Heart disease Father    Heart disease Brother    Colon cancer Paternal Grandfather    Diabetes Daughter    Esophageal cancer Neg Hx    Rectal cancer Neg Hx    Stomach cancer Neg Hx     Social History   Tobacco Use   Smoking status: Former    Packs/day: 1.50    Years: 40.00    Pack years: 60.00    Types: Cigarettes    Quit date: 08/2021    Years since quitting: 0.1   Smokeless tobacco: Never  Vaping Use   Vaping Use: Former   Substances: THC  Substance Use Topics   Alcohol use: No   Drug use: Yes    Frequency: 3.0 times per week    Types: Marijuana    Comment: last use 09/09/21    Home Medications Prior to Admission medications    Medication Sig Start Date End Date Taking? Authorizing Provider  albuterol (VENTOLIN HFA) 108 (90 Base) MCG/ACT inhaler Inhale 2 puffs into the lungs every 6 (six) hours as needed for wheezing or shortness of breath (asthma). 08/22/21   Aline August, MD  ALPRAZolam Duanne Moron) 1 MG tablet Take 1 mg by mouth 2 (two) times daily as needed. 04/15/17   [provider]  amLODipine (NORVASC) 10 MG tablet TAKE 1 TABLET BY MOUTH ONCE DAILY Patient taking differently: Take 10 mg by mouth daily. 04/13/21   Cantwell, Celeste C, PA-C  atorvastatin (LIPITOR) 80 MG tablet Take 80 mg by mouth at bedtime.    [provider]  azelastine (ASTELIN) 0.1 % nasal spray Place 1 spray into both nostrils daily as needed for allergies (seasonal allergies). 06/24/19   [provider]  calcium acetate (PHOSLO) 667 MG capsule Take 2,001 mg by mouth See admin instructions. Take 2001mg  (3 capsules) by mouth three times a day with meals and 667mg  (1 capsule) with a snack    [provider]  cetirizine (ZYRTEC) 10 MG tablet Take 10 mg by mouth daily as needed for allergies.    [provider]  Ensure (ENSURE) Take 237 mLs by mouth 2 (two) times daily between meals.    [provider]  ezetimibe (ZETIA) 10 MG tablet TAKE 1 TABLET BY MOUTH ONCE A DAY Patient taking differently: Take 10 mg by mouth daily. 05/03/21   Cantwell, Celeste C, PA-C  isosorbide mononitrate (IMDUR) 60 MG 24 hr tablet Take 1 tablet (60 mg total) by mouth daily. Patient taking differently: Take 30 mg by mouth daily. 06/14/21 09/13/21  Terrilee Croak, MD  labetalol (NORMODYNE) 100 MG tablet Take 100 mg by mouth 2 (two) times daily.    [provider]  LINZESS 72 MCG capsule TAKE 1 CAPSULE BY MOUTH ONCE DAILY BEFORE BREAKFAST Patient taking  differently: Take 72 mcg by mouth daily before breakfast. 08/03/21   Levin Erp, PA  megestrol (MEGACE) 40 MG tablet Take 40 mg by mouth daily.    [provider]  mometasone-formoterol (DULERA) 100-5 MCG/ACT AERO Take 2 puffs first thing in am and then another 2 puffs about 12 hours later. Patient taking differently: Inhale 2 puffs into the lungs every 12 (twelve) hours. 04/26/17   Tanda Rockers, MD  nitroGLYCERIN (NITROSTAT) 0.4 MG SL tablet Place 1 tablet (0.4 mg total) under the tongue every 5 (five) minutes as needed for chest pain. 10/15/19   Miquel Dunn, NP  ondansetron (ZOFRAN-ODT) 4 MG disintegrating tablet Take 1 tablet (4 mg total) by mouth every 8 (eight) hours as needed for nausea or vomiting. Patient taking differently: Take 4 mg by mouth daily. Alternate with Promethazine 07/25/21   Hayden Rasmussen, MD  oxyCODONE-acetaminophen (PERCOCET/ROXICET) 5-325 MG tablet Take 1 tablet by mouth every 6 (six) hours as needed for severe pain. Patient taking differently: Take 1 tablet by mouth daily. Take additional 1 tablet if needed for pain 08/22/21   Aline August, MD  pantoprazole (PROTONIX) 40 MG tablet Take 1 tablet (40 mg total) by mouth daily. 08/22/21   Aline August, MD  promethazine (PHENERGAN) 25 MG tablet Take 25 mg by mouth daily. Alternate with Ondansetron 08/16/20   [provider]  traZODone (DESYREL) 100 MG tablet Take 50-100 mg by mouth at bedtime as needed for sleep.    [provider]  venlafaxine XR (EFFEXOR XR) 150 MG 24 hr capsule Take 2 capsules (300 mg total) by mouth daily with breakfast. 05/12/21   Arrien, Jimmy Picket, MD  vitamin B-12 (CYANOCOBALAMIN) 500 MCG tablet Take 500 mcg by mouth daily.    [provider]    Allergies    Doxycycline, Hydralazine, Aspirin, Hydrocodone, Ibuprofen, Tylenol [acetaminophen], and Iohexol  Review of Systems   Review of Systems  Respiratory:  Positive for shortness of breath.   All other systems reviewed and are negative.  Physical Exam Updated Vital Signs BP (!) 143/73 (BP Location: Right Arm)    Pulse 60    Temp 98 F (36.7 C)  (Oral)    Resp 17    Ht 5\' 1"  (1.549 m)    Wt 42.2 kg    SpO2 98%    BMI 17.57 kg/m   Physical Exam Vitals and nursing note reviewed.  Constitutional:      General: She is not in acute distress.    Appearance: Normal appearance. She is ill-appearing.  HENT:     Head: Normocephalic and atraumatic.  Eyes:     General:        Right eye: No discharge.        Left eye: No discharge.  Cardiovascular:     Rate and Rhythm: Normal rate and regular rhythm.     Heart sounds: No murmur heard.   No friction rub. No gallop.  Pulmonary:     Effort: Pulmonary effort is normal.     Breath sounds: Normal breath sounds.     Comments: Normal respiratory effort, no accessory breath sounds on 4 L nasal cannula Abdominal:     General: Bowel sounds are normal.     Palpations: Abdomen is soft.     Comments: Generalized tenderness palpation throughout abdomen without rebound, rigidity, guarding.  Skin:    General: Skin is warm and dry.     Capillary Refill: Capillary refill takes less than  2 seconds.  Neurological:     Mental Status: She is alert and oriented to person, place, and time.  Psychiatric:        Mood and Affect: Mood normal.        Behavior: Behavior normal.    ED Results / Procedures / Treatments   Labs (all labs ordered are listed, but only abnormal results are displayed) Labs Reviewed  CBC WITH DIFFERENTIAL/PLATELET - Abnormal; Notable for the following components:      Result Value   RBC 2.66 (*)    Hemoglobin 8.2 (*)    HCT 26.2 (*)    RDW 18.7 (*)    All other components within normal limits  RESP PANEL BY RT-PCR (FLU A&B, COVID) ARPGX2  COMPREHENSIVE METABOLIC PANEL  LIPASE, BLOOD  LACTIC ACID, PLASMA  LACTIC ACID, PLASMA    EKG EKG Interpretation  Date/Time:  Monday September 18 2021 13:06:03 EST Ventricular Rate:  60 PR Interval:  127 QRS Duration: 91 QT Interval:  456 QTC Calculation: 456 R Axis:   93 Text Interpretation: Sinus rhythm Consider left atrial  enlargement Borderline right axis deviation Consider left ventricular hypertrophy Anterior Q waves, possibly due to LVH Confirmed by Regan Lemming ([id number]) on 09/18/2021 1:13:31 PM  Radiology No results found.  Procedures .Critical Care Performed by: Anselmo Pickler, PA-C Authorized by: Anselmo Pickler, PA-C   Critical care provider statement:    Critical care time (minutes):  31   Critical care time was exclusive of:  Separately billable procedures and treating other patients   Critical care was necessary to treat or prevent imminent or life-threatening deterioration of the following conditions:  Respiratory failure   Critical care was time spent personally by me on the following activities:  Development of treatment plan with patient or surrogate, discussions with consultants, evaluation of patient's response to treatment, examination of patient, ordering and review of laboratory studies, ordering and review of radiographic studies, ordering and performing treatments and interventions, pulse oximetry, re-evaluation of patient's condition and review of old charts   Care discussed with: admitting provider     Medications Ordered in ED Medications - No data to display  ED Course  I have reviewed the triage vital signs and the nursing notes.  Pertinent labs & imaging results that were available during my care of the patient were reviewed by me and considered in my medical decision making (see chart for details).    MDM Rules/Calculators/A&P                         I discussed this case with my attending physician who cosigned this note including patient's presenting symptoms, physical exam, and planned diagnostics and interventions. Attending physician stated agreement with plan or made changes to plan which were implemented.   Somewhat ill-appearing patient presents via EMS with shortness of breath, new oxygen requirement.  Patient is chronic COPD, chronic kidney disease,  recently on ESRD, with missed dialysis today.  Patient received albuterol, Atrovent, Solu-Medrol, magnesium in route.  Patient with stable respiratory rate, no respiratory distress, no accessory breath sounds on my exam on 4 L nasal cannula at this time.  Initial screening lab work is significant for anemia of 8.2, down 2 points from most recent reading 2 days ago.  Patient also has mildly elevated BUN, creatinine which is expected in context of missed dialysis x1 day.  Patient with improved work of breathing, no accessory breath sounds after stabilization prior  to arrival by EMS.  Patient with inability to come off of 4 L nasal cannula at this time without oxygen desaturation.  We will consult for admission at this time for new acute hypoxic respiratory failure with oxygen requirement in the context of a known ESRD patient, missed dialysis x1 day, COPD, rectal bleeding as a known complication, patient has been evaluated for chronic mesenteric ischemia.  Initial lactic acid is 1.0 at this time.  Some thought that her difficulty breathing may be secondary to fluid overload instead of acute COPD exacerbation as patient has some pleural effusion bilaterally as well as diffuse anasarca on her CT.  Spoke with hospitalist who agrees to admission at this time.  Final Clinical Impression(s) / ED Diagnoses Final diagnoses:  None    Rx / DC Orders ED Discharge Orders     None        Dorien Chihuahua 09/18/21 1530    Regan Lemming, MD 09/18/21 1559

## 2021-09-18 NOTE — Consult Note (Addendum)
[facility] Renal Consultation Note    Indication for Consultation:  Management of ESRD/hemodialysis; anemia, hypertension/volume and secondary hyperparathyroidism  CLE:XNTZGY, Shanon Brow, MD  HPI: [name] is a 60 y.o. female with ESRD on HD MWF at [facility] (recently transferred from [place] d/t being closer to home). Patient is a recent start to HD. First session started on 08/21/21.  Past medical history significant for COPD Gold stage III, noncompliant with home oxygen, IBS with chronic diarrhea, HTN, chronic diastolic CHF, CAD, PVD, status post bilateral common iliac stenting, severe protein calorie malnutrition, chronic anemia secondary to CKD. Patient presents to the ED c/o SOB. Reports occasional cough and denies fevers and chills. Last HD on 09/15/21 where she received full treatment. She admits to not being compliant with her home O2. Also reports ongoing diarrhea with ABD cramping. Denies N/V. CXR shows bibasilar opacities consistent with atelectasis vs. Infiltrates. CT ABD/pelvis no acute findings within ABD/pelvis. Lab work includes: K+ 4.7, BUN 40, SrCr 4.73, Albumin 2.6, and Hgb 8.2. Plan for HD this evening to remain on usual schedule.  Past Medical History:  Diagnosis Date   Anemia 2008   Anginal pain ([place])    Anxiety    Aortic stenosis    abominal aorta, s/p distal aortic stent 09/28/18, 12/01/20   Arthritis    "all my joints ache" (09/07/2014)   Asthma    Bipolar 1 disorder ([place])    Blood transfusion without reported diagnosis    Bradycardia    Bruit    Carpal tunnel syndrome    Cataract    forming   Cervical cancer ([place]) 1985   CHF (congestive heart failure) ([place])    Chronic kidney disease (CKD), stage V (HCC)    COPD (chronic obstructive pulmonary disease) ([place]) 2000   Coronary artery disease    Daily headache    Depression 2000   Diverticulitis 2008   Emphysema of lung (HCC)    GERD (gastroesophageal reflux disease)     Glaucoma    Heart murmur    History of colon polyps 2009   HLD (hyperlipidemia) 2013   Hypertension 2013   Hypovitaminosis D    IBS (irritable bowel syndrome) 2008   Myocardial infarction [facility])    2015   PAD (peripheral artery disease) ([place])    Pancreatitis 10/2011   Pneumonia 07/2014   RLS (restless legs syndrome)    Schizophrenia (HCC)    Shortness of breath    Skin cancer    Small bowel obstruction ([place]) 2008   Thyroid disease    Past Surgical History:  Procedure Laterality Date   ABDOMINAL ANGIOGRAM N/A 09/07/2014   Procedure: ABDOMINAL ANGIOGRAM;  Surgeon: Laverda Page, MD;  Location: [facility] CATH LAB;  Service: Cardiovascular;  Laterality: N/A;   ABDOMINAL AORTOGRAM W/LOWER EXTREMITY N/A 12/28/2020   Procedure: ABDOMINAL AORTOGRAM W/LOWER EXTREMITY;  Surgeon: Cherre Robins, MD;  Location: [place] CV LAB;  Service: Cardiovascular;  Laterality: N/A;   APPENDECTOMY  10/01/1994   AV FISTULA PLACEMENT Left 09/14/2021   Procedure: INSERTION OF ARTERIOVENOUS LEFT UPPER GORE-TEX GRAFT ARM;  Surgeon: Cherre Robins, MD;  Location: [place];  Service: Vascular;  Laterality: Left;   BIOPSY  08/06/2020   Procedure: BIOPSY;  Surgeon: Jackquline Denmark, MD;  Location: [facility] ENDOSCOPY;  Service: Endoscopy;;   COLON SURGERY  10/01/2006   6 inches of colon removed due to obstruction   COLONOSCOPY WITH PROPOFOL N/A 10/25/2016   Procedure: COLONOSCOPY WITH PROPOFOL;  Surgeon:  Milus Banister, MD;  Location: [facility];  Service: Endoscopy;  Laterality: N/A;   COLONOSCOPY WITH PROPOFOL N/A 08/07/2020   Procedure: COLONOSCOPY WITH PROPOFOL;  Surgeon: Jackquline Denmark, MD;  [facility];  Service: Endoscopy;  Laterality: N/A;   COLONOSCOPY WITH PROPOFOL N/A 08/27/2020   Procedure: COLONOSCOPY WITH PROPOFOL;  Surgeon: Lavena Bullion, DO;  [facility]: [facility];  Service: Gastroenterology;  Laterality: N/A;   CORONARY ANGIOPLASTY WITH STENT PLACEMENT  09/07/2014   "2"   ENTEROSCOPY  N/A 08/27/2020   Procedure: ENTEROSCOPY;  Surgeon: Lavena Bullion, DO;  [facility];  Service: Gastroenterology;  Laterality: N/A;  Push enteroscopy    ENTEROSCOPY N/A 06/12/2021   Procedure: ENTEROSCOPY;  Surgeon: Rush Landmark Telford Nab., MD;  [facility];  Service: Gastroenterology;  Laterality: N/A;   ENTEROSCOPY N/A 08/18/2021   Procedure: ENTEROSCOPY;  Surgeon: Jerene Bears, MD;  [facility];  Service: Gastroenterology;  Laterality: N/A;   ESOPHAGOGASTRODUODENOSCOPY (EGD) WITH PROPOFOL N/A 08/06/2020   Procedure: ESOPHAGOGASTRODUODENOSCOPY (EGD) WITH PROPOFOL;  Surgeon: Jackquline Denmark, MD;  [facility];  Service: Endoscopy;  Laterality: N/A;   GIVENS CAPSULE STUDY N/A 08/24/2020   Procedure: GIVENS CAPSULE STUDY;  Surgeon: Lavena Bullion, DO;  [facility];  Service: Gastroenterology;  Laterality: N/A;   GIVENS CAPSULE STUDY N/A 08/18/2021   Procedure: GIVENS CAPSULE;  Surgeon: Jerene Bears, MD;  [facility];  Service: Gastroenterology;  Laterality: N/A;   HOT HEMOSTASIS N/A 08/07/2020   Procedure: HOT HEMOSTASIS (ARGON PLASMA COAGULATION/BICAP);  Surgeon: Jackquline Denmark, MD;  Location: [facility];  Service: Endoscopy;  Laterality: N/A;   HOT HEMOSTASIS N/A 08/27/2020   Procedure: HOT HEMOSTASIS (ARGON PLASMA COAGULATION/BICAP);  Surgeon: Lavena Bullion, DO;  [facility];  Service: Gastroenterology;  Laterality: N/A;   IR FLUORO GUIDE CV LINE RIGHT  08/21/2021   IR US GUIDE VASC ACCESS RIGHT  08/21/2021   LEFT HEART CATH AND CORONARY ANGIOGRAPHY N/A 03/25/2018   Procedure: LEFT HEART CATH AND CORONARY ANGIOGRAPHY;  Surgeon: Nigel Mormon, MD;  Location: [place] CV LAB;  Service: Cardiovascular;  Laterality: N/A;   LEFT HEART CATH AND CORONARY ANGIOGRAPHY N/A 05/09/2021   Procedure: LEFT HEART CATH AND CORONARY ANGIOGRAPHY;  Surgeon: Adrian Prows, MD;  Location: [place] CV LAB;  Service:  Cardiovascular;  Laterality: N/A;   LEFT HEART CATHETERIZATION WITH CORONARY ANGIOGRAM N/A 09/07/2014   Procedure: LEFT HEART CATHETERIZATION WITH CORONARY ANGIOGRAM;  Surgeon: Laverda Page, MD;  Location: [facility] CATH LAB;  Service: Cardiovascular;  Laterality: N/A;   LOWER EXTREMITY ANGIOGRAPHY  10/29/2017   Procedure: Lower Extremity Angiography;  Surgeon: Adrian Prows, MD;  Location: [place] CV LAB;  Service: Cardiovascular;;   PERIPHERAL VASCULAR CATHETERIZATION N/A 11/01/2015   Procedure: Renal Angiography;  Surgeon: Adrian Prows, MD;  Location: [place] CV LAB;  Service: Cardiovascular;  Laterality: N/A;   PERIPHERAL VASCULAR CATHETERIZATION N/A 04/10/2016   Procedure: Renal Angiography;  Surgeon: Adrian Prows, MD;  Location: [place] CV LAB;  Service: Cardiovascular;  Laterality: N/A;   PERIPHERAL VASCULAR CATHETERIZATION  04/10/2016   Procedure: Peripheral Vascular Intervention;  Surgeon: Adrian Prows, MD;  Location: [place] CV LAB;  Service: Cardiovascular;;   PERIPHERAL VASCULAR INTERVENTION  10/29/2017   Procedure: PERIPHERAL VASCULAR INTERVENTION;  Surgeon: Adrian Prows, MD;  Location: [place] CV LAB;  Service: Cardiovascular;;   PERIPHERAL VASCULAR INTERVENTION Bilateral 12/28/2020   Procedure: PERIPHERAL VASCULAR INTERVENTION;  Surgeon: Cherre Robins, MD;  Location: [facility]  INVASIVE CV LAB;  Service: Cardiovascular;  Laterality: Bilateral;   POLYPECTOMY  08/07/2020   Procedure: POLYPECTOMY;  Surgeon: Jackquline Denmark, MD;  [facility];  Service: Endoscopy;;   POLYPECTOMY  08/27/2020   Procedure: POLYPECTOMY;  Surgeon: Lavena Bullion, DO;  [facility];  Service: Gastroenterology;;   POLYPECTOMY     RENAL ANGIOGRAPHY N/A 10/29/2017   Procedure: RENAL ANGIOGRAPHY;  Surgeon: Adrian Prows, MD;  Location: [place] CV LAB;  Service: Cardiovascular;  Laterality: N/A;   RENAL ANGIOGRAPHY N/A 05/10/2020   Procedure: RENAL ANGIOGRAPHY;  Surgeon: Nigel Mormon, MD;  Location: [place] CV LAB;  Service: Cardiovascular;  Laterality: N/A;   RIGHT OOPHORECTOMY Right 10/01/1994   SUBMUCOSAL TATTOO INJECTION  06/12/2021   Procedure: SUBMUCOSAL TATTOO INJECTION;  Surgeon: Irving Copas., MD;  [facility];  Service: Gastroenterology;;   TOTAL ABDOMINAL HYSTERECTOMY  10/02/1995   UPPER GASTROINTESTINAL ENDOSCOPY     Family History  Problem Relation Age of Onset   Other Mother        many bowel obstructions   Heart disease Mother    Colon polyps Mother    Kidney cancer Father    Bone cancer Father    Diabetes Father    Heart disease Father    Heart disease Brother    Colon cancer Paternal Grandfather    Diabetes Daughter    Esophageal cancer Neg Hx    Rectal cancer Neg Hx    Stomach cancer Neg Hx    Social History:  reports that she quit smoking about 6 weeks ago. Her smoking use included cigarettes. She has a 60.00 pack-year smoking history. She has never used smokeless tobacco. She reports current drug use. Frequency: 3.00 times per week. Drug: Marijuana. She reports that she does not drink alcohol. Allergies  Allergen Reactions   Doxycycline Anaphylaxis and Hives   Hydralazine Shortness Of Breath and Rash    Patient reports "couldn't breath"   Aspirin Other (See Comments)    Internal bleeding- MD SAID to not take this   Hydrocodone Nausea And Vomiting   Ibuprofen Other (See Comments)    Caused internal bleeding   Tylenol [Acetaminophen] Other (See Comments)    MD told the patient to not take this   Iohexol Itching and Other (See Comments)    Pt has itching nose after iv contrast injection   Prior to Admission medications   Medication Sig Start Date End Date Taking? Authorizing Provider  albuterol (VENTOLIN HFA) 108 (90 Base) MCG/ACT inhaler Inhale 2 puffs into the lungs every 6 (six) hours as needed for wheezing or shortness of breath (asthma). 08/22/21  Yes Aline August, MD  ALPRAZolam Duanne Moron) 1 MG  tablet Take 1 mg by mouth 2 (two) times daily as needed for anxiety. 04/15/17  Yes [provider]  amLODipine (NORVASC) 10 MG tablet TAKE 1 TABLET BY MOUTH ONCE DAILY Patient taking differently: Take 10 mg by mouth daily. 04/13/21  Yes Cantwell, Celeste C, PA-C  atorvastatin (LIPITOR) 80 MG tablet Take 80 mg by mouth at bedtime.   Yes [provider]  azelastine (ASTELIN) 0.1 % nasal spray Place 1 spray into both nostrils daily as needed for allergies (seasonal allergies). 06/24/19  Yes [provider]  calcium acetate (PHOSLO) 667 MG capsule Take 2,001 mg by mouth See admin instructions. Take 2001mg  (3 capsules) by mouth three times a day with meals and 667mg  (1 capsule) with a snack   Yes [provider]  Calcium Carb-Cholecalciferol (CALCIUM 600 + D PO) Take 1 tablet by mouth daily.   Yes [provider]  cetirizine (ZYRTEC) 10 MG tablet Take 10 mg by mouth daily as needed for allergies.   Yes [provider]  Ensure (ENSURE) Take 237 mLs by mouth 2 (two) times daily between meals.   Yes [provider]  ezetimibe (ZETIA) 10 MG tablet TAKE 1 TABLET BY MOUTH ONCE A DAY Patient taking differently: Take 10 mg by mouth daily. 05/03/21  Yes Cantwell, Celeste C, PA-C  isosorbide mononitrate (IMDUR) 30 MG 24 hr tablet Take 30 mg by mouth daily.   Yes [provider]  labetalol (NORMODYNE) 100 MG tablet Take 100 mg by mouth 2 (two) times daily.   Yes [provider]  LINZESS 72 MCG capsule TAKE 1 CAPSULE BY MOUTH ONCE DAILY BEFORE BREAKFAST Patient taking differently: Take 72 mcg by mouth daily before breakfast. 08/03/21  Yes Levin Erp, PA  megestrol (MEGACE) 40 MG tablet Take 40 mg by mouth daily.   Yes [provider]  mometasone-formoterol (DULERA) 100-5 MCG/ACT AERO Take 2 puffs first thing in am and then another 2 puffs about 12 hours later. Patient taking differently: Inhale 2 puffs into the lungs  every 12 (twelve) hours. 04/26/17  Yes Tanda Rockers, MD  nitroGLYCERIN (NITROSTAT) 0.4 MG SL tablet Place 1 tablet (0.4 mg total) under the tongue every 5 (five) minutes as needed for chest pain. 10/15/19  Yes Miquel Dunn, NP  ondansetron (ZOFRAN-ODT) 4 MG disintegrating tablet Take 1 tablet (4 mg total) by mouth every 8 (eight) hours as needed for nausea or vomiting. Patient taking differently: Take 4 mg by mouth daily. Alternate with Promethazine 07/25/21  Yes Hayden Rasmussen, MD  oxyCODONE-acetaminophen (PERCOCET/ROXICET) 5-325 MG tablet Take 1 tablet by mouth 2 (two) times daily as needed for pain. 09/13/21 09/13/22 Yes [provider]  pantoprazole (PROTONIX) 40 MG tablet Take 1 tablet (40 mg total) by mouth daily. 08/22/21  Yes Aline August, MD  promethazine (PHENERGAN) 25 MG tablet Take 25 mg by mouth daily. Alternate with Ondansetron 08/16/20  Yes [provider]  traZODone (DESYREL) 100 MG tablet Take 50-100 mg by mouth at bedtime as needed for sleep.   Yes [provider]  venlafaxine XR (EFFEXOR XR) 150 MG 24 hr capsule Take 2 capsules (300 mg total) by mouth daily with breakfast. 05/12/21  Yes Arrien, Jimmy Picket, MD  vitamin B-12 (CYANOCOBALAMIN) 500 MCG tablet Take 500 mcg by mouth daily.   Yes [provider]   Current Facility-Administered Medications  Medication Dose Route Frequency Provider Last Rate Last Admin   0.9 %  sodium chloride infusion  100 mL Intravenous PRN [name] E, NP       0.9 %  sodium chloride infusion  100 mL Intravenous PRN [name] E, NP       albuterol (PROVENTIL) (2.5 MG/3ML) 0.083% nebulizer solution 3 mL  3 mL Inhalation Q6H PRN Lequita Halt, MD       ALPRAZolam Duanne Moron) tablet 1 mg  1 mg Oral BID PRN Wynetta Fines T, MD       alteplase (CATHFLO ACTIVASE) injection 2 mg  2 mg Intracatheter Once PRN Adelfa Koh, NP       [START ON 09/19/2021] amLODipine (NORVASC) tablet 10 mg   10 mg Oral Daily Wynetta Fines T, MD       azelastine (ASTELIN) 0.1 % nasal spray 1 spray  1  spray Each Nare Daily PRN Lequita Halt, MD       azithromycin  [facility]) 500 mg in sodium chloride 0.9 % 250 mL IVPB  500 mg Intravenous Q24H Wynetta Fines T, MD       Followed by   Derrill Memo ON 09/19/2021] azithromycin [facility]) tablet 500 mg  500 mg Oral Daily Wynetta Fines T, MD       calcium acetate (PHOSLO) capsule 2,001 mg  2,001 mg Oral TID WC Heloise Purpura, RPH       calcium acetate (PHOSLO) capsule 667 mg  667 mg Oral With snacks Heloise Purpura, RPH       [START ON 09/19/2021] calcium-vitamin D (OSCAL WITH D) 500-5 MG-MCG per tablet 1 tablet  1 tablet Oral Q breakfast Heloise Purpura, [facility]       [START ON 09/19/2021] Chlorhexidine Gluconate Cloth 2 % PADS 6 each  6 each Topical Q0600 Adelfa Koh, NP       feeding supplement (ENSURE ENLIVE / ENSURE PLUS) liquid 237 mL  237 mL Oral BID BM Lequita Halt, MD       heparin injection 1,000 Units  1,000 Units Dialysis PRN Adelfa Koh, NP       ipratropium-albuterol (DUONEB) 0.5-2.5 (3) MG/3ML nebulizer solution 3 mL  3 mL Nebulization Q6H Lequita Halt, MD       [START ON 09/19/2021] isosorbide mononitrate (IMDUR) 24 hr tablet 30 mg  30 mg Oral Daily Wynetta Fines T, MD       labetalol (NORMODYNE) tablet 100 mg  100 mg Oral BID Wynetta Fines T, MD       lidocaine (PF) (XYLOCAINE) 1 % injection 5 mL  5 mL Intradermal PRN Adelfa Koh, NP       lidocaine-prilocaine (EMLA) cream 1 application  1 application Topical PRN Adelfa Koh, NP       Derrill Memo ON 09/19/2021] linaclotide (LINZESS) capsule 72 mcg  72 mcg Oral QAC breakfast Wynetta Fines T, MD       loratadine (CLARITIN) tablet 10 mg  10 mg Oral Daily Wynetta Fines T, MD       Derrill Memo ON 09/19/2021] megestrol (MEGACE) tablet 40 mg  40 mg Oral Daily Zhang, Pearletha Forge T, MD       mometasone-formoterol (DULERA) 100-5 MCG/ACT inhaler 2 puff  2 puff Inhalation Q12H Wynetta Fines T, MD        ondansetron (ZOFRAN-ODT) disintegrating tablet 4 mg  4 mg Oral Q8H PRN Lequita Halt, MD       oxyCODONE-acetaminophen (PERCOCET/ROXICET) 5-325 MG per tablet 1 tablet  1 tablet Oral BID PRN Lequita Halt, MD       [START ON 09/19/2021] pantoprazole (PROTONIX) EC tablet 40 mg  40 mg Oral Daily Zhang, Pearletha Forge T, MD       pentafluoroprop-tetrafluoroeth (GEBAUERS) aerosol 1 application  1 application Topical PRN Adelfa Koh, NP       Derrill Memo ON 09/19/2021] predniSONE (DELTASONE) tablet 40 mg  40 mg Oral Q breakfast Wynetta Fines T, MD       [START ON 09/19/2021] promethazine (PHENERGAN) tablet 25 mg  25 mg Oral Daily Wynetta Fines T, MD       traZODone (DESYREL) tablet 50-100 mg  50-100 mg Oral QHS PRN Lequita Halt, MD       [START ON 09/19/2021] venlafaxine XR (EFFEXOR-XR) 24 hr capsule 300 mg  300 mg Oral Q breakfast Lequita Halt, MD       [  START ON 09/19/2021] vitamin B-12 (CYANOCOBALAMIN) tablet 500 mcg  500 mcg Oral Daily Lequita Halt, MD       Labs: Basic Metabolic Panel: Recent Labs  Lab 09/14/21 0811 09/18/21 1317  NA 136 134*  K 5.1 4.7  CL 102 98  CO2  --  24  GLUCOSE 92 125*  BUN 30* 40*  CREATININE 3.20* 4.73*  CALCIUM  --  8.7*   Liver Function Tests: Recent Labs  Lab 09/18/21 1317  AST 111*  ALT 50*  ALKPHOS 189*  BILITOT 0.9  PROT 6.2*  ALBUMIN 2.6*   Recent Labs  Lab 09/18/21 1317  LIPASE 34   No results for input(s): AMMONIA in the last 168 hours. CBC: Recent Labs  Lab 09/14/21 0811 09/18/21 1317  WBC  --  10.5  NEUTROABS  --  8.4*  HGB 10.9* 8.2*  HCT 32.0* 26.2*  MCV  --  98.5  PLT  --  392   Cardiac Enzymes: No results for input(s): CKTOTAL, CKMB, CKMBINDEX, TROPONINI in the last 168 hours. CBG: No results for input(s): GLUCAP in the last 168 hours. Iron Studies: No results for input(s): IRON, TIBC, TRANSFERRIN, FERRITIN in the last 72 hours. Studies/Results: CT ABDOMEN PELVIS WO CONTRAST  Result Date: 09/18/2021 CLINICAL DATA:   Abdominal pain, acute, nonlocalized. Shortness of breath for 3-4 weeks with hypoxia. Missed dialysis EXAM: CT ABDOMEN AND PELVIS WITHOUT CONTRAST TECHNIQUE: Multidetector CT imaging of the abdomen and pelvis was performed following the standard protocol without IV contrast. COMPARISON:  08/16/2021 FINDINGS: Lower chest: Small bilateral pleural effusions with associated atelectasis or consolidation within the lingula and bilateral lower lobes. Mild cardiomegaly. Extensive coronary artery calcification. Trace pericardial effusion. Relative hypoattenuation of the cardiac blood pool indicative of anemia. Hepatobiliary: Unremarkable unenhanced appearance of the liver. No focal liver lesion identified. Gallbladder within normal limits. No hyperdense gallstone. No biliary dilatation. Pancreas: Grossly unremarkable, although evaluation is limited in the absence of intravenous contrast and relative paucity of intra-fat. Spleen: Normal in size without focal abnormality. Adrenals/Urinary Tract: Unremarkable adrenal glands. Advanced left renal atrophy. No renal stone or hydronephrosis. Urinary bladder unremarkable for the degree of distension. Stomach/Bowel: Evaluation limited in the absence of intravenous or enteric contrast. Stomach is within normal limits. No evidence of bowel wall thickening, distention, or inflammatory changes. Vascular/Lymphatic: Severe, age advanced atherosclerotic calcifications throughout the aortoiliac axis. Bilateral iliac stents. No abdominopelvic lymphadenopathy is evident. Reproductive: Status post hysterectomy. No adnexal masses. Other: No free fluid. No abdominopelvic fluid collection. No pneumoperitoneum. No abdominal wall hernia. Musculoskeletal: Diffuse anasarca.  No acute osseous abnormality. IMPRESSION: 1. Small bilateral pleural effusions with associated atelectasis or consolidation within the lingula and bilateral lower lobes. 2. No acute findings identified within the abdomen or pelvis.  3. Diffuse anasarca. 4. Severe, age advanced atherosclerotic calcifications throughout the aortoiliac axis (ICD10-I70.0). Electronically Signed   By: Davina Poke D.O.   On: 09/18/2021 14:16   DG Chest 2 View  Result Date: 09/18/2021 CLINICAL DATA:  Shortness of breath EXAM: CHEST - 2 VIEW COMPARISON:  Chest radiograph dated August 18, 2021. FINDINGS: The heart is enlarged. Small bilateral pleural effusions. Bibasilar opacities which may represent pulmonary edema or infiltrate. Right IJ access double-lumen catheter is noted. The osseous structures are unremarkable. IMPRESSION: Stable cardiomegaly with small bilateral pleural effusions. Bibasilar opacities which may represent atelectasis or infiltrate. Electronically Signed   By: Keane Police D.O.   On: 09/18/2021 14:00    Physical Exam: Vitals:   09/18/21 1311  09/18/21 1316  BP: (!) 143/73   Pulse: 60   Resp: 17   Temp: 98 F (36.7 C)   TempSrc: Oral   SpO2: 98%   Weight:  42.2 kg  Height:  5\' 1"  (1.549 m)     General: WDWN NAD Head: NCAT sclera not icteric MMM Lungs: CTA bilaterally. No wheeze, rales or rhonchi. Breathing is unlabored. Heart: RRR. No murmur, rubs or gallops.  Abdomen: soft, nontender, +BS, no guarding, no rebound tenderness Lower extremities: 1+ edema BLLE; no ischemic changes. Noted mimi open area at R big toe-no drainage present.  Neuro: AAOx3. Moves all extremities spontaneously. Dialysis Access: R TDC; L AVG (recently placed 09/14/21 by Dr. Stanford Breed)  Dialysis Orders:  MWF - [facility] (recent trasfer from [place])  3hrs69min, BFR 400, DFR Auto 1.5, EDW 42kg (recently lowered in outpatient 09/15/21), 3K/ 2.5Ca Heparin No Heparin bolus Mircera 75 mcg q2wks - last 09/15/21 Calcitriol 0.15mcg PO qHD-last 09/15/21 Home meds: Calcium Acetate 667mg  3 tabs TID with meals  Assessment/Plan: Acute Hypoxic Respiratory Failure-more likely COPD exacerbation and fluid overload, not compliant with home  O2.  COPD Exacerbation-weaning O2 slowly; continue short course steroids and PO ABX. Continue LABA and Duo nebs ESRD - HD today per usual schedule. EDW recently lowered in outpatient. Noted edema BLLE, plan for UFG 2L as tolerated. Hypertension/volume  - Appear mildly overloaded; Bps marginally elevated. Continue current anti-hypertensive regimen  Anemia of CKD - Hgb now 8.2; it was actually 7.3 in outpatient; ESA dose not due yet Secondary Hyperparathyroidism -  Corr Ca okay, will check PO4 in AM, continue binders for now. Nutrition - Renal diet with fluid restriction. Albumin 2.6, will add protein supplements  Cheyenne Poet, NP [facility] 09/18/2021, 4:49 PM    I have seen and examined this patient and agree with plan and assessment in the above note with renal recommendations/intervention highlighted. Will plan for HD today and UF as tolerated to see if this improves her oxygenation and breathing.  Complaining of abdominal pain and issues with chronic diarrhea (8 years per her report).   Governor [place] Jaselle Pryer,MD 09/18/2021 6:54 PM

## 2021-09-18 NOTE — H&P (Signed)
History and Physical    [name] BJS:[id number] DOB: Feb 24, 1961 DOA: 09/18/2021  PCP: Bernerd Limbo, MD (Confirm with patient/family/NH records and if not entered, this has to be entered at [facility] point of entry) Patient coming from: Home  I have personally briefly reviewed patient's old medical records in [place]  Chief Complaint: SOB  HPI: [name] is a 60 y.o. female with medical history significant of CKD stage V recently started HD, COPD Gold stage III noncompliant with home oxygen, IBS with chronic diarrhea, HTN, chronic diastolic CHF, CAD, PVD, status post bilateral common iliac stenting, severe protein calorie malnutrition, chronic anemia secondary to CKD, presented with increasing shortness of breath and cough.  Admitted that she has not been compliant with home oxygen.  She started to feel increasing shortness of breath intermittent cough for 3 to 4 weeks worsening over the weekend.  Subjective fever last night, no chills.  Mostly dry cough occasionally with thick yellowish sputum.  No chest pains.  She also complains about chronic maroon-colored diarrhea 3-6 times a day, with abdominal cramping located on the left lower quadrant, no relieving factors denies tenesmus.  Family called EMS this morning, EMS arrived and found patient O2 saturation 65%, stabilized on 4 L.  Patient was given IV Solu-Medrol magnesium and albuterol.  She claims she quit smoking 1 month ago.  ED Course: No tachycardia, no tachypneic.  Blood pressure stable, afebrile.  Chest x-ray cardiomegaly and mild pulmonary congestion.  CT abdomen pelvis and chest showed mild bilateral pulm congestion with small bilateral pleural effusion.  Abdomen showed no acute findings.  Blood work WBC 10.5, hemoglobin 8.2 compared to 9.79-month ago, creatinine 4.7, K4.7, bicarb 24.  Review of Systems: As per HPI otherwise 14 point review of systems negative.    Past Medical History:  Diagnosis Date   Anemia  2008   Anginal pain ([place])    Anxiety    Aortic stenosis    abominal aorta, s/p distal aortic stent 09/28/18, 12/01/20   Arthritis    "all my joints ache" (09/07/2014)   Asthma    Bipolar 1 disorder ([place])    Blood transfusion without reported diagnosis    Bradycardia    Bruit    Carpal tunnel syndrome    Cataract    forming   Cervical cancer ([place]) 1985   CHF (congestive heart failure) ([place])    Chronic kidney disease (CKD), stage V (HCC)    COPD (chronic obstructive pulmonary disease) ([place]) 2000   Coronary artery disease    Daily headache    Depression 2000   Diverticulitis 2008   Emphysema of lung (HCC)    GERD (gastroesophageal reflux disease)    Glaucoma    Heart murmur    History of colon polyps 2009   HLD (hyperlipidemia) 2013   Hypertension 2013   Hypovitaminosis D    IBS (irritable bowel syndrome) 2008   Myocardial infarction [facility])    2015   PAD (peripheral artery disease) ([place])    Pancreatitis 10/2011   Pneumonia 07/2014   RLS (restless legs syndrome)    Schizophrenia (HCC)    Shortness of breath    Skin cancer    Small bowel obstruction ([place]) 2008   Thyroid disease     Past Surgical History:  Procedure Laterality Date   ABDOMINAL ANGIOGRAM N/A 09/07/2014   Procedure: ABDOMINAL ANGIOGRAM;  Surgeon: Laverda Page, MD;  Location: [facility] CATH LAB;  Service: Cardiovascular;  Laterality: N/A;   ABDOMINAL  AORTOGRAM W/LOWER EXTREMITY N/A 12/28/2020   Procedure: ABDOMINAL AORTOGRAM W/LOWER EXTREMITY;  Surgeon: Cherre Robins, MD;  Location: [place] CV LAB;  Service: Cardiovascular;  Laterality: N/A;   APPENDECTOMY  10/01/1994   AV FISTULA PLACEMENT Left 09/14/2021   Procedure: INSERTION OF ARTERIOVENOUS LEFT UPPER GORE-TEX GRAFT ARM;  Surgeon: Cherre Robins, MD;  Location: [facility] OR;  Service: Vascular;  Laterality: Left;   BIOPSY  08/06/2020   Procedure: BIOPSY;  Surgeon: Jackquline Denmark, MD;  Location: [facility] ENDOSCOPY;  Service: Endoscopy;;   COLON SURGERY   10/01/2006   6 inches of colon removed due to obstruction   COLONOSCOPY WITH PROPOFOL N/A 10/25/2016   Procedure: COLONOSCOPY WITH PROPOFOL;  Surgeon: Milus Banister, MD;  Location: [facility] ENDOSCOPY;  Service: Endoscopy;  Laterality: N/A;   COLONOSCOPY WITH PROPOFOL N/A 08/07/2020   Procedure: COLONOSCOPY WITH PROPOFOL;  Surgeon: Jackquline Denmark, MD;  Location: [facility];  Service: Endoscopy;  Laterality: N/A;   COLONOSCOPY WITH PROPOFOL N/A 08/27/2020   Procedure: COLONOSCOPY WITH PROPOFOL;  Surgeon: Lavena Bullion, DO;  Location: [facility];  Service: Gastroenterology;  Laterality: N/A;   CORONARY ANGIOPLASTY WITH STENT PLACEMENT  09/07/2014   "2"   ENTEROSCOPY N/A 08/27/2020   Procedure: ENTEROSCOPY;  Surgeon: Lavena Bullion, DO;  Location: [facility];  Service: Gastroenterology;  Laterality: N/A;  Push enteroscopy    ENTEROSCOPY N/A 06/12/2021   Procedure: ENTEROSCOPY;  Surgeon: Rush Landmark Telford Nab., MD;  Location: [facility];  Service: Gastroenterology;  Laterality: N/A;   ENTEROSCOPY N/A 08/18/2021   Procedure: ENTEROSCOPY;  Surgeon: Jerene Bears, MD;  [facility]: [facility];  Service: Gastroenterology;  Laterality: N/A;   ESOPHAGOGASTRODUODENOSCOPY (EGD) WITH PROPOFOL N/A 08/06/2020   Procedure: ESOPHAGOGASTRODUODENOSCOPY (EGD) WITH PROPOFOL;  Surgeon: Jackquline Denmark, MD;  [facility];  Service: Endoscopy;  Laterality: N/A;   GIVENS CAPSULE STUDY N/A 08/24/2020   Procedure: GIVENS CAPSULE STUDY;  Surgeon: Lavena Bullion, DO;  [facility];  Service: Gastroenterology;  Laterality: N/A;   GIVENS CAPSULE STUDY N/A 08/18/2021   Procedure: GIVENS CAPSULE;  Surgeon: Jerene Bears, MD;  [facility];  Service: Gastroenterology;  Laterality: N/A;   HOT HEMOSTASIS N/A 08/07/2020   Procedure: HOT HEMOSTASIS (ARGON PLASMA COAGULATION/BICAP);  Surgeon: Jackquline Denmark, MD;  Location: [facility];  Service: Endoscopy;  Laterality: N/A;   HOT  HEMOSTASIS N/A 08/27/2020   Procedure: HOT HEMOSTASIS (ARGON PLASMA COAGULATION/BICAP);  Surgeon: Lavena Bullion, DO;  [facility];  Service: Gastroenterology;  Laterality: N/A;   IR FLUORO GUIDE CV LINE RIGHT  08/21/2021   IR US GUIDE VASC ACCESS RIGHT  08/21/2021   LEFT HEART CATH AND CORONARY ANGIOGRAPHY N/A 03/25/2018   Procedure: LEFT HEART CATH AND CORONARY ANGIOGRAPHY;  Surgeon: Nigel Mormon, MD;  Location: [place] CV LAB;  Service: Cardiovascular;  Laterality: N/A;   LEFT HEART CATH AND CORONARY ANGIOGRAPHY N/A 05/09/2021   Procedure: LEFT HEART CATH AND CORONARY ANGIOGRAPHY;  Surgeon: Adrian Prows, MD;  Location: [place] CV LAB;  Service: Cardiovascular;  Laterality: N/A;   LEFT HEART CATHETERIZATION WITH CORONARY ANGIOGRAM N/A 09/07/2014   Procedure: LEFT HEART CATHETERIZATION WITH CORONARY ANGIOGRAM;  Surgeon: Laverda Page, MD;  Location: [facility] CATH LAB;  Service: Cardiovascular;  Laterality: N/A;   LOWER EXTREMITY ANGIOGRAPHY  10/29/2017   Procedure: Lower Extremity Angiography;  Surgeon: Adrian Prows, MD;  Location: [place] CV LAB;  Service: Cardiovascular;;   PERIPHERAL VASCULAR CATHETERIZATION N/A 11/01/2015   Procedure: Renal Angiography;  Surgeon: Adrian Prows, MD;  Location: [place] CV LAB;  Service: Cardiovascular;  Laterality: N/A;   PERIPHERAL VASCULAR CATHETERIZATION N/A 04/10/2016   Procedure: Renal Angiography;  Surgeon: Adrian Prows, MD;  Location: [place] CV LAB;  Service: Cardiovascular;  Laterality: N/A;   PERIPHERAL VASCULAR CATHETERIZATION  04/10/2016   Procedure: Peripheral Vascular Intervention;  Surgeon: Adrian Prows, MD;  Location: [place] CV LAB;  Service: Cardiovascular;;   PERIPHERAL VASCULAR INTERVENTION  10/29/2017   Procedure: PERIPHERAL VASCULAR INTERVENTION;  Surgeon: Adrian Prows, MD;  Location: [place] CV LAB;  Service: Cardiovascular;;   PERIPHERAL VASCULAR INTERVENTION Bilateral 12/28/2020   Procedure: PERIPHERAL  VASCULAR INTERVENTION;  Surgeon: Cherre Robins, MD;  Location: [place] CV LAB;  Service: Cardiovascular;  Laterality: Bilateral;   POLYPECTOMY  08/07/2020   Procedure: POLYPECTOMY;  Surgeon: Jackquline Denmark, MD;  [facility];  Service: Endoscopy;;   POLYPECTOMY  08/27/2020   Procedure: POLYPECTOMY;  Surgeon: Lavena Bullion, DO;  [facility]: [facility];  Service: Gastroenterology;;   POLYPECTOMY     RENAL ANGIOGRAPHY N/A 10/29/2017   Procedure: RENAL ANGIOGRAPHY;  Surgeon: Adrian Prows, MD;  Location: [place] CV LAB;  Service: Cardiovascular;  Laterality: N/A;   RENAL ANGIOGRAPHY N/A 05/10/2020   Procedure: RENAL ANGIOGRAPHY;  Surgeon: Nigel Mormon, MD;  Location: [place] CV LAB;  Service: Cardiovascular;  Laterality: N/A;   RIGHT OOPHORECTOMY Right 10/01/1994   SUBMUCOSAL TATTOO INJECTION  06/12/2021   Procedure: SUBMUCOSAL TATTOO INJECTION;  Surgeon: Irving Copas., MD;  [facility];  Service: Gastroenterology;;   TOTAL ABDOMINAL HYSTERECTOMY  10/02/1995   UPPER GASTROINTESTINAL ENDOSCOPY       reports that she quit smoking about 6 weeks ago. Her smoking use included cigarettes. She has a 60.00 pack-year smoking history. She has never used smokeless tobacco. She reports current drug use. Frequency: 3.00 times per week. Drug: Marijuana. She reports that she does not drink alcohol.  Allergies  Allergen Reactions   Doxycycline Anaphylaxis and Hives   Hydralazine Shortness Of Breath and Rash    Patient reports "couldn't breath"   Aspirin Other (See Comments)    Internal bleeding- MD SAID to not take this   Hydrocodone Nausea And Vomiting   Ibuprofen Other (See Comments)    Caused internal bleeding   Tylenol [Acetaminophen] Other (See Comments)    MD told the patient to not take this   Iohexol Itching and Other (See Comments)    Pt has itching nose after iv contrast injection    Family History  Problem Relation Age of Onset   Other  Mother        many bowel obstructions   Heart disease Mother    Colon polyps Mother    Kidney cancer Father    Bone cancer Father    Diabetes Father    Heart disease Father    Heart disease Brother    Colon cancer Paternal Grandfather    Diabetes Daughter    Esophageal cancer Neg Hx    Rectal cancer Neg Hx    Stomach cancer Neg Hx      Prior to Admission medications   Medication Sig Start Date End Date Taking? Authorizing Provider  albuterol (VENTOLIN HFA) 108 (90 Base) MCG/ACT inhaler Inhale 2 puffs into the lungs every 6 (six) hours as needed for wheezing or shortness of breath (asthma). 08/22/21  Yes Aline August, MD  ALPRAZolam Duanne Moron) 1 MG tablet Take 1 mg by mouth 2 (two) times daily as  needed for anxiety. 04/15/17  Yes [provider]  amLODipine (NORVASC) 10 MG tablet TAKE 1 TABLET BY MOUTH ONCE DAILY Patient taking differently: Take 10 mg by mouth daily. 04/13/21  Yes Cantwell, Celeste C, PA-C  atorvastatin (LIPITOR) 80 MG tablet Take 80 mg by mouth at bedtime.   Yes [provider]  azelastine (ASTELIN) 0.1 % nasal spray Place 1 spray into both nostrils daily as needed for allergies (seasonal allergies). 06/24/19  Yes [provider]  calcium acetate (PHOSLO) 667 MG capsule Take 2,001 mg by mouth See admin instructions. Take 2001mg  (3 capsules) by mouth three times a day with meals and 667mg  (1 capsule) with a snack   Yes [provider]  Calcium Carb-Cholecalciferol (CALCIUM 600 + D PO) Take 1 tablet by mouth daily.   Yes [provider]  cetirizine (ZYRTEC) 10 MG tablet Take 10 mg by mouth daily as needed for allergies.   Yes [provider]  Ensure (ENSURE) Take 237 mLs by mouth 2 (two) times daily between meals.   Yes [provider]  ezetimibe (ZETIA) 10 MG tablet TAKE 1 TABLET BY MOUTH ONCE A DAY Patient taking differently: Take 10 mg by mouth daily. 05/03/21  Yes Cantwell, Celeste C, PA-C  isosorbide  mononitrate (IMDUR) 30 MG 24 hr tablet Take 30 mg by mouth daily.   Yes [provider]  labetalol (NORMODYNE) 100 MG tablet Take 100 mg by mouth 2 (two) times daily.   Yes [provider]  LINZESS 72 MCG capsule TAKE 1 CAPSULE BY MOUTH ONCE DAILY BEFORE BREAKFAST Patient taking differently: Take 72 mcg by mouth daily before breakfast. 08/03/21  Yes Levin Erp, PA  megestrol (MEGACE) 40 MG tablet Take 40 mg by mouth daily.   Yes [provider]  mometasone-formoterol (DULERA) 100-5 MCG/ACT AERO Take 2 puffs first thing in am and then another 2 puffs about 12 hours later. Patient taking differently: Inhale 2 puffs into the lungs every 12 (twelve) hours. 04/26/17  Yes Tanda Rockers, MD  nitroGLYCERIN (NITROSTAT) 0.4 MG SL tablet Place 1 tablet (0.4 mg total) under the tongue every 5 (five) minutes as needed for chest pain. 10/15/19  Yes Miquel Dunn, NP  ondansetron (ZOFRAN-ODT) 4 MG disintegrating tablet Take 1 tablet (4 mg total) by mouth every 8 (eight) hours as needed for nausea or vomiting. Patient taking differently: Take 4 mg by mouth daily. Alternate with Promethazine 07/25/21  Yes Hayden Rasmussen, MD  oxyCODONE-acetaminophen (PERCOCET/ROXICET) 5-325 MG tablet Take 1 tablet by mouth 2 (two) times daily as needed for pain. 09/13/21 09/13/22 Yes [provider]  pantoprazole (PROTONIX) 40 MG tablet Take 1 tablet (40 mg total) by mouth daily. 08/22/21  Yes Aline August, MD  promethazine (PHENERGAN) 25 MG tablet Take 25 mg by mouth daily. Alternate with Ondansetron 08/16/20  Yes [provider]  traZODone (DESYREL) 100 MG tablet Take 50-100 mg by mouth at bedtime as needed for sleep.   Yes [provider]  venlafaxine XR (EFFEXOR XR) 150 MG 24 hr capsule Take 2 capsules (300 mg total) by mouth daily with breakfast. 05/12/21  Yes Arrien, Jimmy Picket, MD  vitamin B-12 (CYANOCOBALAMIN) 500 MCG tablet Take 500 mcg by mouth  daily.   Yes [provider]    Physical Exam: Vitals:   09/18/21 1311 09/18/21 1316  BP: (!) 143/73   Pulse: 60   Resp: 17   Temp: 98 F (36.7 C)   TempSrc: Oral  SpO2: 98%   Weight:  42.2 kg  Height:  5\' 1"  (1.549 m)    Constitutional: NAD, calm, comfortable Vitals:   09/18/21 1311 09/18/21 1316  BP: (!) 143/73   Pulse: 60   Resp: 17   Temp: 98 F (36.7 C)   TempSrc: Oral   SpO2: 98%   Weight:  42.2 kg  Height:  5\' 1"  (1.549 m)   Eyes: PERRL, lids and conjunctivae normal ENMT: Mucous membranes are moist. Posterior pharynx clear of any exudate or lesions.Normal dentition.  Neck: normal, supple, no masses, no thyromegaly Respiratory: clear to auscultation bilaterally, diffuse wheezing, fine crackles on bilateral lower field.  Increasing respiratory effort. No accessory muscle use.  Cardiovascular: Regular rate and rhythm, no murmurs / rubs / gallops. 2+ extremity edema mainly posterior ankles. 2+ pedal pulses. No carotid bruits.  Abdomen: no tenderness, no masses palpated. No hepatosplenomegaly. Bowel sounds positive.  Musculoskeletal: no clubbing / cyanosis. No joint deformity upper and lower extremities. Good ROM, no contractures. Normal muscle tone.  Skin: no rashes, lesions, ulcers. No induration Neurologic: CN 2-12 grossly intact. Sensation intact, DTR normal. Strength 5/5 in all 4.  Psychiatric: Normal judgment and insight. Alert and oriented x 3. Normal mood.     Labs on Admission: I have personally reviewed following labs and imaging studies  CBC: Recent Labs  Lab 09/14/21 0811 09/18/21 1317  WBC  --  10.5  NEUTROABS  --  8.4*  HGB 10.9* 8.2*  HCT 32.0* 26.2*  MCV  --  98.5  PLT  --  578   Basic Metabolic Panel: Recent Labs  Lab 09/14/21 0811 09/18/21 1317  NA 136 134*  K 5.1 4.7  CL 102 98  CO2  --  24  GLUCOSE 92 125*  BUN 30* 40*  CREATININE 3.20* 4.73*  CALCIUM  --  8.7*   GFR: Estimated Creatinine Clearance: 8.6 mL/min (A)  (by C-G formula based on SCr of 4.73 mg/dL (H)). Liver Function Tests: Recent Labs  Lab 09/18/21 1317  AST 111*  ALT 50*  ALKPHOS 189*  BILITOT 0.9  PROT 6.2*  ALBUMIN 2.6*   Recent Labs  Lab 09/18/21 1317  LIPASE 34   No results for input(s): AMMONIA in the last 168 hours. Coagulation Profile: No results for input(s): INR, PROTIME in the last 168 hours. Cardiac Enzymes: No results for input(s): CKTOTAL, CKMB, CKMBINDEX, TROPONINI in the last 168 hours. BNP (last 3 results) No results for input(s): PROBNP in the last 8760 hours. HbA1C: No results for input(s): HGBA1C in the last 72 hours. CBG: No results for input(s): GLUCAP in the last 168 hours. Lipid Profile: No results for input(s): CHOL, HDL, LDLCALC, TRIG, CHOLHDL, LDLDIRECT in the last 72 hours. Thyroid Function Tests: No results for input(s): TSH, T4TOTAL, FREET4, T3FREE, THYROIDAB in the last 72 hours. Anemia Panel: No results for input(s): VITAMINB12, FOLATE, FERRITIN, TIBC, IRON, RETICCTPCT in the last 72 hours. Urine analysis:    Component Value Date/Time   COLORURINE YELLOW 08/20/2021 0140   APPEARANCEUR CLEAR 08/20/2021 0140   APPEARANCEUR Cloudy (A) 03/22/2021 1432   LABSPEC 1.012 08/20/2021 0140   LABSPEC 1.016 06/07/2012 1350   PHURINE 5.0 08/20/2021 0140   GLUCOSEU 50 (A) 08/20/2021 0140   GLUCOSEU Negative 06/07/2012 1350   HGBUR SMALL (A) 08/20/2021 0140   BILIRUBINUR NEGATIVE 08/20/2021 0140   BILIRUBINUR Negative 03/22/2021 1432   BILIRUBINUR Negative 06/07/2012 1350   KETONESUR NEGATIVE 08/20/2021 0140   PROTEINUR 100 (A) 08/20/2021 0140  UROBILINOGEN 0.2 06/28/2014 1518   NITRITE NEGATIVE 08/20/2021 0140   LEUKOCYTESUR NEGATIVE 08/20/2021 0140   LEUKOCYTESUR Negative 06/07/2012 1350    Radiological Exams on Admission: CT ABDOMEN PELVIS WO CONTRAST  Result Date: 09/18/2021 CLINICAL DATA:  Abdominal pain, acute, nonlocalized. Shortness of breath for 3-4 weeks with hypoxia. Missed  dialysis EXAM: CT ABDOMEN AND PELVIS WITHOUT CONTRAST TECHNIQUE: Multidetector CT imaging of the abdomen and pelvis was performed following the standard protocol without IV contrast. COMPARISON:  08/16/2021 FINDINGS: Lower chest: Small bilateral pleural effusions with associated atelectasis or consolidation within the lingula and bilateral lower lobes. Mild cardiomegaly. Extensive coronary artery calcification. Trace pericardial effusion. Relative hypoattenuation of the cardiac blood pool indicative of anemia. Hepatobiliary: Unremarkable unenhanced appearance of the liver. No focal liver lesion identified. Gallbladder within normal limits. No hyperdense gallstone. No biliary dilatation. Pancreas: Grossly unremarkable, although evaluation is limited in the absence of intravenous contrast and relative paucity of intra-fat. Spleen: Normal in size without focal abnormality. Adrenals/Urinary Tract: Unremarkable adrenal glands. Advanced left renal atrophy. No renal stone or hydronephrosis. Urinary bladder unremarkable for the degree of distension. Stomach/Bowel: Evaluation limited in the absence of intravenous or enteric contrast. Stomach is within normal limits. No evidence of bowel wall thickening, distention, or inflammatory changes. Vascular/Lymphatic: Severe, age advanced atherosclerotic calcifications throughout the aortoiliac axis. Bilateral iliac stents. No abdominopelvic lymphadenopathy is evident. Reproductive: Status post hysterectomy. No adnexal masses. Other: No free fluid. No abdominopelvic fluid collection. No pneumoperitoneum. No abdominal wall hernia. Musculoskeletal: Diffuse anasarca.  No acute osseous abnormality. IMPRESSION: 1. Small bilateral pleural effusions with associated atelectasis or consolidation within the lingula and bilateral lower lobes. 2. No acute findings identified within the abdomen or pelvis. 3. Diffuse anasarca. 4. Severe, age advanced atherosclerotic calcifications throughout the  aortoiliac axis (ICD10-I70.0). Electronically Signed   By: Davina Poke D.O.   On: 09/18/2021 14:16   DG Chest 2 View  Result Date: 09/18/2021 CLINICAL DATA:  Shortness of breath EXAM: CHEST - 2 VIEW COMPARISON:  Chest radiograph dated August 18, 2021. FINDINGS: The heart is enlarged. Small bilateral pleural effusions. Bibasilar opacities which may represent pulmonary edema or infiltrate. Right IJ access double-lumen catheter is noted. The osseous structures are unremarkable. IMPRESSION: Stable cardiomegaly with small bilateral pleural effusions. Bibasilar opacities which may represent atelectasis or infiltrate. Electronically Signed   By: Keane Police D.O.   On: 09/18/2021 14:00    EKG: Independently reviewed. Sinus, chronic nonspecific ST changes on multiple leads  Assessment/Plan Principal Problem:   COPD (chronic obstructive pulmonary disease) (HCC)  (please populate well all problems here in Problem List. (For example, if patient is on BP meds at home and you resume or decide to hold them, it is a problem that needs to be her. Same for CAD, COPD, HLD and so on)  Acute hypoxic respite failure -Multifactorial from a mainly COPD exacerbation plus mild fluid overload. -COPD exacerbation likely from noncompliant with home oxygen.  Acute COPD exacerbation -Continue wean down oxygen -Short course of p.o. steroid treatment -Short course of azithromycin p.o. -Continue inhaled steroid and LABA, and scheduled DuoNebs and as needed albuterol  Chronic diarrhea -Rule out chronic infections, GI pathogen and lactoferrin -Check stool fat to rule out chronic pancreatic insufficiency. -Patient was recently Extensively Scoped with EGD, Colonoscopy and endoscopy less than 1 month ago, will not repeat GI workup this time.  CKD stage V on HD -Discussed with on-call nephrology, HD tonight versus tomorrow morning.  Chronic anemia secondary to CKD -Slight decrease  of hemoglobin compared to last  month however, her iron studies remain within normal limits on last mission, recent iron study, and consider EPO infusion on this admission.  Acute transaminitis -No RUQ abdominal pain, CT abdomen no acute finding on hepatic-biliary system. -Repeat LFT tomorrow, -Hold off starting  HTN -Continue home BP meds  PVD -Not on aspirin for allergy.  Outpatient follow-up with vascular surgery cardiology discussed whether to start Plavix.  Chronic mesenteric ischemia -Appears to be stable, pattern of diarrhea and abdominal pain not related to food intake.  Severe protein calorie malnutrition -Continue supplement of Ensure.  Consult nutrition -Outpatient rule out any malnutrition, pancreatic insufficiency. -Continue Megace  DVT prophylaxis: SCD Code Status: Full code Family Communication: Granddaughter at bedside Disposition Plan: Expect more than 2 midnight hospital stay to wean down oxygen and treat COPD exacerbation. Consults called: None Admission status: MedSurg admit   Lequita Halt MD [facility] Pager [phone]  09/18/2021, 3:27 PM

## 2021-09-18 NOTE — ED Triage Notes (Signed)
Pt bib [facility] from home with complaints of increased shob x3-4 weeks. Upon arrival of EMS they found her at 65% on room air and brought her up to 96% on 4L [place]. En route EMS gave albuterol, 0.5 Atrovent, 125mg  solumedrol, 2g mag. Pt is dialysis pt with [facility] and did not go today. PT arrives denying any new pain past her chronic level and AOx4. EMS vitals: 130/90, [id number]

## 2021-09-18 NOTE — Progress Notes (Signed)
Pt arrived to the unit from HD. Pt c/o 7/10 chronic lower left intermittent abdominal pain. Bed in lowest position, call light within reach, bed alarm on. Pt given snack and drink. Pt has no additional questions at this time.

## 2021-09-18 NOTE — ED Notes (Signed)
Patient transported to X-ray 

## 2021-09-19 DIAGNOSIS — D500 Iron deficiency anemia secondary to blood loss (chronic): Secondary | ICD-10-CM

## 2021-09-19 DIAGNOSIS — R1013 Epigastric pain: Secondary | ICD-10-CM

## 2021-09-19 DIAGNOSIS — G8929 Other chronic pain: Secondary | ICD-10-CM

## 2021-09-19 LAB — CBC
HCT: 23.9 % — ABNORMAL LOW (ref 36.0–46.0)
Hemoglobin: 7.5 g/dL — ABNORMAL LOW (ref 12.0–15.0)
MCH: 31 pg (ref 26.0–34.0)
MCHC: 31.4 g/dL (ref 30.0–36.0)
MCV: 98.8 fL (ref 80.0–100.0)
Platelets: 273 10*3/uL (ref 150–400)
RBC: 2.42 MIL/uL — ABNORMAL LOW (ref 3.87–5.11)
RDW: 18.9 % — ABNORMAL HIGH (ref 11.5–15.5)
WBC: 7.2 10*3/uL (ref 4.0–10.5)
nRBC: 5.7 % — ABNORMAL HIGH (ref 0.0–0.2)

## 2021-09-19 LAB — COMPREHENSIVE METABOLIC PANEL
ALT: 53 U/L — ABNORMAL HIGH (ref 0–44)
AST: 96 U/L — ABNORMAL HIGH (ref 15–41)
Albumin: 2.4 g/dL — ABNORMAL LOW (ref 3.5–5.0)
Alkaline Phosphatase: 170 U/L — ABNORMAL HIGH (ref 38–126)
Anion gap: 11 (ref 5–15)
BUN: 16 mg/dL (ref 6–20)
CO2: 25 mmol/L (ref 22–32)
Calcium: 8.6 mg/dL — ABNORMAL LOW (ref 8.9–10.3)
Chloride: 100 mmol/L (ref 98–111)
Creatinine, Ser: 2.6 mg/dL — ABNORMAL HIGH (ref 0.44–1.00)
GFR, Estimated: 21 mL/min — ABNORMAL LOW (ref >60)
Glucose, Bld: 121 mg/dL — ABNORMAL HIGH (ref 70–99)
Potassium: 4.1 mmol/L (ref 3.5–5.1)
Sodium: 136 mmol/L (ref 135–145)
Total Bilirubin: 0.7 mg/dL (ref 0.3–1.2)
Total Protein: 5.7 g/dL — ABNORMAL LOW (ref 6.5–8.1)

## 2021-09-19 LAB — PATHOLOGIST SMEAR REVIEW

## 2021-09-19 LAB — PREPARE RBC (CROSSMATCH)

## 2021-09-19 LAB — PHOSPHORUS: Phosphorus: 3.7 mg/dL (ref 2.5–4.6)

## 2021-09-19 MED ORDER — OXYCODONE-ACETAMINOPHEN 5-325 MG PO TABS
1.0000 | ORAL_TABLET | Freq: Once | ORAL | Status: AC
  Administered 2021-09-19: 20:00:00 1 via ORAL

## 2021-09-19 MED ORDER — SODIUM CHLORIDE 0.9% IV SOLUTION: Freq: Once | INTRAVENOUS | Status: DC

## 2021-09-19 MED ORDER — DIPHENHYDRAMINE HCL 50 MG/ML IJ SOLN
25.0000 mg | Freq: Once | INTRAMUSCULAR | Status: DC
  Filled 2021-09-19: qty 1

## 2021-09-19 MED ORDER — AZITHROMYCIN 250 MG PO TABS
500.0000 mg | ORAL_TABLET | Freq: Every day | ORAL | Status: DC
  Administered 2021-09-19 – 2021-09-21 (×3): 500 mg via ORAL
  Filled 2021-09-19 (×3): qty 2

## 2021-09-19 MED ORDER — DIPHENHYDRAMINE HCL 50 MG/ML IJ SOLN
25.0000 mg | Freq: Once | INTRAMUSCULAR | Status: AC
  Administered 2021-09-20: 14:00:00 25 mg via INTRAVENOUS
  Filled 2021-09-19: qty 1

## 2021-09-19 MED ORDER — RENA-VITE PO TABS
1.0000 | ORAL_TABLET | Freq: Every day | ORAL | Status: DC
  Administered 2021-09-19 – 2021-09-20 (×2): 1 via ORAL
  Filled 2021-09-19 (×2): qty 1

## 2021-09-19 NOTE — Progress Notes (Signed)
[name], female   DOB: 1961-05-24, 60 y.o.   MRN: [id number] S: Resting comfortably on room air. O:BP (!) 161/49 (BP Location: Left Arm)    Pulse 66    Temp 97.6 F (36.4 C)    Resp 17    Ht 5\' 1"  (1.549 m)    Wt 42.2 kg    SpO2 (!) 86%    BMI 17.57 kg/m   Intake/Output Summary (Last 24 hours) at 09/19/2021 1010 Last data filed at 09/19/2021 0500 Gross per 24 hour  Intake 100 ml  Output 2000 ml  Net -1900 ml   Intake/Output: I/O last 3 completed shifts: In: 100 [P.O.:100] Out: 2000 [Other:2000]  Intake/Output this shift:  No intake/output data recorded. Weight change:  Gen: NAD CVS: RRR Resp: CTA Abd: +BS, soft, NT/Nd Ext: no edema, LUE AVG +T/B  Recent Labs  Lab 09/14/21 0811 09/18/21 1317 09/19/21 0324  NA 136 134* 136  K 5.1 4.7 4.1  CL 102 98 100  CO2  --  24 25  GLUCOSE 92 125* 121*  BUN 30* 40* 16  CREATININE 3.20* 4.73* 2.60*  ALBUMIN  --  2.6* 2.4*  CALCIUM  --  8.7* 8.6*  PHOS  --   --  3.7  AST  --  111* 96*  ALT  --  50* 53*   Liver Function Tests: Recent Labs  Lab 09/18/21 1317 09/19/21 0324  AST 111* 96*  ALT 50* 53*  ALKPHOS 189* 170*  BILITOT 0.9 0.7  PROT 6.2* 5.7*  ALBUMIN 2.6* 2.4*   Recent Labs  Lab 09/18/21 1317  LIPASE 34   No results for input(s): AMMONIA in the last 168 hours. CBC: Recent Labs  Lab 09/18/21 1317 09/18/21 2226 09/19/21 0324  WBC 10.5  --  7.2  NEUTROABS 8.4*  --   --   HGB 8.2* 8.0* 7.5*  HCT 26.2* 24.5* 23.9*  MCV 98.5  --  98.8  PLT 392  --  273   Cardiac Enzymes: No results for input(s): CKTOTAL, CKMB, CKMBINDEX, TROPONINI in the last 168 hours. CBG: No results for input(s): GLUCAP in the last 168 hours.  Iron Studies:  Recent Labs    09/18/21 1516  IRON 60  TIBC 270  FERRITIN 1,304*   Studies/Results: CT ABDOMEN PELVIS WO CONTRAST  Result Date: 09/18/2021 CLINICAL DATA:  Abdominal pain, acute, nonlocalized. Shortness of breath for 3-4 weeks with hypoxia. Missed  dialysis EXAM: CT ABDOMEN AND PELVIS WITHOUT CONTRAST TECHNIQUE: Multidetector CT imaging of the abdomen and pelvis was performed following the standard protocol without IV contrast. COMPARISON:  08/16/2021 FINDINGS: Lower chest: Small bilateral pleural effusions with associated atelectasis or consolidation within the lingula and bilateral lower lobes. Mild cardiomegaly. Extensive coronary artery calcification. Trace pericardial effusion. Relative hypoattenuation of the cardiac blood pool indicative of anemia. Hepatobiliary: Unremarkable unenhanced appearance of the liver. No focal liver lesion identified. Gallbladder within normal limits. No hyperdense gallstone. No biliary dilatation. Pancreas: Grossly unremarkable, although evaluation is limited in the absence of intravenous contrast and relative paucity of intra-fat. Spleen: Normal in size without focal abnormality. Adrenals/Urinary Tract: Unremarkable adrenal glands. Advanced left renal atrophy. No renal stone or hydronephrosis. Urinary bladder unremarkable for the degree of distension. Stomach/Bowel: Evaluation limited in the absence of intravenous or enteric contrast. Stomach is within normal limits. No evidence of bowel wall thickening, distention, or inflammatory changes. Vascular/Lymphatic: Severe, age advanced atherosclerotic calcifications throughout the aortoiliac axis. Bilateral iliac stents. No abdominopelvic lymphadenopathy is evident.  Reproductive: Status post hysterectomy. No adnexal masses. Other: No free fluid. No abdominopelvic fluid collection. No pneumoperitoneum. No abdominal wall hernia. Musculoskeletal: Diffuse anasarca.  No acute osseous abnormality. IMPRESSION: 1. Small bilateral pleural effusions with associated atelectasis or consolidation within the lingula and bilateral lower lobes. 2. No acute findings identified within the abdomen or pelvis. 3. Diffuse anasarca. 4. Severe, age advanced atherosclerotic calcifications throughout the  aortoiliac axis (ICD10-I70.0). Electronically Signed   By: Davina Poke D.O.   On: 09/18/2021 14:16   DG Chest 2 View  Result Date: 09/18/2021 CLINICAL DATA:  Shortness of breath EXAM: CHEST - 2 VIEW COMPARISON:  Chest radiograph dated August 18, 2021. FINDINGS: The heart is enlarged. Small bilateral pleural effusions. Bibasilar opacities which may represent pulmonary edema or infiltrate. Right IJ access double-lumen catheter is noted. The osseous structures are unremarkable. IMPRESSION: Stable cardiomegaly with small bilateral pleural effusions. Bibasilar opacities which may represent atelectasis or infiltrate. Electronically Signed   By: Keane Police D.O.   On: 09/18/2021 14:00    amLODipine  10 mg Oral Daily   azithromycin  500 mg Oral Daily   calcium acetate  2,001 mg Oral TID WC   calcium acetate  667 mg Oral With snacks   calcium-vitamin D  1 tablet Oral Q breakfast   Chlorhexidine Gluconate Cloth  6 each Topical Q0600   feeding supplement  237 mL Oral BID BM   ipratropium-albuterol  3 mL Nebulization Q6H WA   isosorbide mononitrate  30 mg Oral Daily   labetalol  100 mg Oral BID   linaclotide  72 mcg Oral QAC breakfast   loratadine  10 mg Oral Daily   megestrol  40 mg Oral Daily   mometasone-formoterol  2 puff Inhalation Q12H   pantoprazole  40 mg Oral Daily   predniSONE  40 mg Oral Q breakfast   promethazine  25 mg Oral Daily   venlafaxine XR  300 mg Oral Q breakfast   vitamin B-12  500 mcg Oral Daily    BMET    Component Value Date/Time   NA 136 09/19/2021 0324   NA 141 06/24/2020 1646   NA 134 (L) 06/25/2014 1034   K 4.1 09/19/2021 0324   K 4.8 06/25/2014 1034   CL 100 09/19/2021 0324   CL 102 06/25/2014 1034   CO2 25 09/19/2021 0324   CO2 28 06/25/2014 1034   GLUCOSE 121 (H) 09/19/2021 0324   GLUCOSE 90 06/25/2014 1034   BUN 16 09/19/2021 0324   BUN 27 (H) 06/24/2020 1646   BUN 14 06/25/2014 1034   CREATININE 2.60 (H) 09/19/2021 0324   CREATININE 1.23  06/25/2014 1034   CALCIUM 8.6 (L) 09/19/2021 0324   CALCIUM 9.1 06/25/2014 1034   GFRNONAA 21 (L) 09/19/2021 0324   GFRNONAA 49 (L) 06/25/2014 1034   GFRNONAA >60 06/07/2012 1350   GFRAA 25 (L) 06/24/2020 1646   GFRAA 59 (L) 06/25/2014 1034   GFRAA >60 06/07/2012 1350   CBC    Component Value Date/Time   WBC 7.2 09/19/2021 0324   RBC 2.42 (L) 09/19/2021 0324   RBC 2.49 (L) 09/18/2021 2226   HGB 7.5 (L) 09/19/2021 0324   HGB 10.8 (L) 03/31/2020 1336   HCT 23.9 (L) 09/19/2021 0324   HCT 35.1 03/31/2020 1336   PLT 273 09/19/2021 0324   PLT 405 03/31/2020 1336   MCV 98.8 09/19/2021 0324   MCV 89 03/31/2020 1336   MCV 89 06/25/2014 1034   MCH 31.0 09/19/2021  0324   MCHC 31.4 09/19/2021 0324   RDW 18.9 (H) 09/19/2021 0324   RDW 18.0 (H) 03/31/2020 1336   RDW 16.8 (H) 06/25/2014 1034   LYMPHSABS 1.2 09/18/2021 1317   MONOABS 0.6 09/18/2021 1317   EOSABS 0.0 09/18/2021 1317   BASOSABS 0.1 09/18/2021 1317    Dialysis Orders:  MWF - [facility] (recent trasfer from [place])  3hrs76min, BFR 400, DFR Auto 1.5, EDW 42kg (recently lowered in outpatient 09/15/21), 3K/ 2.5Ca Heparin No Heparin bolus Mircera 75 mcg q2wks - last 09/15/21 Calcitriol 0.75mcg PO qHD-last 09/15/21 Home meds: Calcium Acetate 667mg  3 tabs TID with meals   Assessment/Plan: Acute Hypoxic Respiratory Failure-more likely COPD exacerbation and fluid overload, not compliant with home O2.  Improved after HD and UF of 2 liters COPD Exacerbation-weaning O2 slowly; continue short course steroids and PO ABX. Continue LABA and Duo nebs ESRD - HD today per usual schedule. EDW recently lowered in outpatient.  Will continue with outpatient schedule. Hypertension/volume  - Appear mildly overloaded; Bps marginally elevated. Continue current anti-hypertensive regimen  Anemia of CKD - Hgb now 8.2; it was actually 7.3 in outpatient; ESA dose not due yet Secondary Hyperparathyroidism -  Corr Ca okay, will check PO4  in AM, continue binders for now. Nutrition - Renal diet with fluid restriction. Albumin 2.6, will add protein supplements  Donetta Potts, MD [facility] [phone]

## 2021-09-19 NOTE — Evaluation (Signed)
Occupational Therapy Evaluation Patient Details Name: [name] MRN: [id number] DOB: 10-27-1960 Today's Date: 09/19/2021   History of Present Illness [name] is a 60 y.o. female admitted 09/18/21 with SOB and cough, fever, maroon diarrhea, abdominal cramping. CXR shows bibasilar opacities consistent with atelectasis vs. Infiltrates. Admitted with acute respitatory failure secondary to COPD exacerbation likely due to noncompliant home oxgyen use. PMH includes: CKD stage V recently started HD, COPD Gold stage III noncompliant with home oxygen, IBS with chronic diarrhea, HTN, chronic diastolic CHF, CAD, PVD, s/p  bilateral common iliac stenting, severe protein calorie malnutrition, chronic anemia secondary to CKD.   Clinical Impression   Pt typically mod I for limited household level mobility (SPC PRN) and reports needing some assist with ADLs. She lives with her significant other and her grandson. Today she is overall min guard for transfers with the RW, min guard for LB dressing, set up for seated grooming, min guard/A for toilet transfers. Pt required 2L O2 throughout session to maintain SpO2 >92%. Pt biggest limitation is activity tolerance and impaired cognition. No family present to determine baseline. Initiated energy conservation education today and next session will plan on bringing handout and reinforcing strategies. Pt will benefit from skilled OT in the acute setting to maximize safety and independence in ADL and functional transfers prior to dc home.    Recommendations for follow up therapy are one component of a multi-disciplinary discharge planning process, led by the attending physician.  Recommendations may be updated based on patient status, additional functional criteria and insurance authorization.   Follow Up Recommendations  No OT follow up    Assistance Recommended at Discharge Intermittent Supervision/Assistance  Functional Status Assessment  Patient has had a  recent decline in their functional status and demonstrates the ability to make significant improvements in function in a reasonable and predictable amount of time.  Equipment Recommendations  Tub/shower seat    Recommendations for Other Services PT consult     Precautions / Restrictions Precautions Precautions: Fall;Other (comment) Precaution Comments: watch O2 Restrictions Weight Bearing Restrictions: No      Mobility Bed Mobility Overal bed mobility: Modified Independent             General bed mobility comments: increased time, but no physical assist.    Transfers Overall transfer level: Needs assistance Equipment used: None;Rolling walker (2 wheels) Transfers: Sit to/from Stand Sit to Stand: Min guard           General transfer comment: Min guard for safety. good hand placement      Balance Overall balance assessment: Needs assistance;History of Falls Sitting-balance support: Feet supported;No upper extremity supported Sitting balance-Leahy Scale: Good Sitting balance - Comments: Able to donn socks without difficulty.   Standing balance support: During functional activity Standing balance-Leahy Scale: Fair Standing balance comment: Close Min guard-Min A for safety/balance during dynamic tasks.                           ADL either performed or assessed with clinical judgement   ADL Overall ADL's : Needs assistance/impaired Eating/Feeding: Set up;Sitting   Grooming: Wash/dry face;Wash/dry hands;Min guard;Standing Grooming Details (indicate cue type and reason): decreased activity tolerance for standing grooming Upper Body Bathing: Minimal assistance;Sitting Upper Body Bathing Details (indicate cue type and reason): typically sponge bathes, assist for back Lower Body Bathing: Min guard;Sitting/lateral leans   Upper Body Dressing : Set up;Sitting   Lower Body Dressing: Minimal assistance;Sit to/from  stand   Toilet Transfer: Min  guard;Ambulation;Rolling walker (2 wheels)   Toileting- Clothing Manipulation and Hygiene: Min guard;Sit to/from stand       Functional mobility during ADLs: Min guard;Rolling walker (2 wheels) General ADL Comments: decreased activity tolerance for ADL, initiated energy conservation education, will need handout.     Vision Patient Visual Report: No change from baseline       Perception     Praxis      Pertinent Vitals/Pain Pain Assessment: 0-10 Pain Score: 4  Pain Location: abdomen Pain Descriptors / Indicators: Sore;Aching Pain Intervention(s): Monitored during session;Repositioned     Hand Dominance Right   Extremity/Trunk Assessment Upper Extremity Assessment Upper Extremity Assessment: Generalized weakness   Lower Extremity Assessment Lower Extremity Assessment: Defer to PT evaluation RLE Sensation: decreased light touch LLE Sensation: decreased light touch   Cervical / Trunk Assessment Cervical / Trunk Assessment: Normal   Communication Communication Communication: No difficulties   Cognition Arousal/Alertness: Awake/alert Behavior During Therapy: WFL for tasks assessed/performed Overall Cognitive Status: No family/caregiver present to determine baseline cognitive functioning Area of Impairment: Memory;Safety/judgement;Problem solving;Awareness                     Memory: Decreased short-term memory   Safety/Judgement: Decreased awareness of safety;Decreased awareness of deficits Awareness: Emergent Problem Solving: Slow processing;Requires verbal cues General Comments: pleasantly confused and conversational     General Comments  on 2L O2 throughout session, SpO2>92% throughout, however Pt does get DOE 2-3/4 and fatigues quickly    Exercises     Shoulder Instructions      Home Living Family/patient expects to be discharged to:: Private residence Living Arrangements: Other relatives (29 yo grandson, bf) Available Help at Discharge:  Family;Available PRN/intermittently Type of Home: House Home Access: Stairs to enter;Ramped entrance Entrance Stairs-Number of Steps: 3 in front, ramp in back Entrance Stairs-Rails: Right Home Layout: One level     Bathroom Shower/Tub: Teacher, early years/pre: Standard     Home Equipment: Cane - single point;Shower seat          Prior Functioning/Environment Prior Level of Function : Needs assist;Independent/Modified Independent       Physical Assist : ADLs (physical)     Mobility Comments: Walks independently, does not work. Helps with IADLs. Wears 02 at night. Reports lots of falls at home. ADLs Comments: Has needed help with ADLs. Does bird baths        OT Problem List: Decreased activity tolerance;Impaired balance (sitting and/or standing);Decreased knowledge of use of DME or AE      OT Treatment/Interventions: Self-care/ADL training;Energy conservation;DME and/or AE instruction;Therapeutic activities;Patient/family education;Balance training    OT Goals(Current goals can be found in the care plan section) Acute Rehab OT Goals Patient Stated Goal: feel better, breathe better OT Goal Formulation: With patient Time For Goal Achievement: 10/03/21 Potential to Achieve Goals: Good ADL Goals Pt Will Perform Upper Body Dressing: with modified independence;sitting Pt Will Perform Lower Body Dressing: with modified independence;sit to/from stand Pt Will Transfer to Toilet: with modified independence;ambulating Pt Will Perform Toileting - Clothing Manipulation and hygiene: with modified independence;sit to/from stand Additional ADL Goal #1: Pt will recall at least 3 ways of conserving energy during ADL routine at independent level  OT Frequency: Min 2X/week   Barriers to D/C:            Co-evaluation              AM-PAC OT "6 Clicks" Daily  Activity     Outcome Measure Help from another person eating meals?: None Help from another person taking care  of personal grooming?: A Little Help from another person toileting, which includes using toliet, bedpan, or urinal?: A Little Help from another person bathing (including washing, rinsing, drying)?: A Little Help from another person to put on and taking off regular upper body clothing?: None Help from another person to put on and taking off regular lower body clothing?: None 6 Click Score: 21   End of Session Equipment Utilized During Treatment: Gait belt;Rolling walker (2 wheels);Oxygen (2L) Nurse Communication: Mobility status  Activity Tolerance: Patient tolerated treatment well (impacted by fatigue) Patient left: in bed;with call bell/phone within reach;with bed alarm set  OT Visit Diagnosis: Unsteadiness on feet (R26.81);History of falling (Z91.81);Muscle weakness (generalized) (M62.81);Adult, failure to thrive (R62.7)                Time: 1207-1231 OT Time Calculation (min): 24 min Charges:  OT General Charges $OT Visit: 1 Visit OT Evaluation $OT Eval Moderate Complexity: 1 Mod OT Treatments $Self Care/Home Management : 8-22 mins  Jesse Sans OTR/L Acute Rehabilitation Services Pager: [phone] Office: [place] 09/19/2021, 1:36 PM

## 2021-09-19 NOTE — Consult Note (Addendum)
Referring Provider: Dr. Niel Hummer  Primary Care Physician:  Bernerd Limbo, MD Primary Gastroenterologist:  Dr. Owens Loffler  Reason for Consultation: GI bleed   HPI: [name] is a 60 y.o. female  [name] is a 60 year old female with a past medical history of anxiety, depression, bipolar disorder, hypertension, coronary artery disease s/p MI 2015, AS, bradycardia, peripheral arterial disease with chronic mesenteric ischemia s/p SMA stent, terminal aortic stent and bilateral common iliac artery stent by Dr. Stanford Breed 12/28/2020( no longer on Plavix and ASA), COPD on home oxygen, ESRD on HD every M/W/F (started HD 08/21/2021), GERD, diverticulitis and colon polyps. S/P colon resection secondary to obstruction in 2008 and total abdominal hysterectomy 1997.   She developed increased shortness of breath with left chest pain while at home 09/18/2021. She underwent dialysis earlier in the day without any complications. She called 911 and EMS reported her oxygen saturation on room air was 65%.  She was placed on 4 L nasal cannula and her oxygen saturation went up to 96%.  She received albuterol, Atrovent and Solu-Medrol and she was transported to the ED for further evaluation.  A chest x-ray showed stable cardiomegaly with small bilateral pleural effusions with bibasilar opacities possibly atelectasis or infiltrates.  Labs in the ED showed a hemoglobin level of 8.2 (Hg level 10.9 on 09/14/2021).  Hematocrit 26.2.  WBC 10.5.  Platelet 392.  BUN 40.  Creatinine 4.73.  Alk phos 189.  AST 111.  ALT 50.  Total bili 0.9. Today her hemoglobin dropped to 7.5 and she reported passing black stools therefore GI consult was requested for further evaluation. One unit of PRBCs has been ordered, not yet transfused.   She denies taking any aspirin or other NSAIDs.  She is not on any oral anticoagulation. She possible receives heparin during her dialysis sessions.  She is no longer taking a specific iron  tablet due to constipation issues.  She states she is taking an alternative iron supplement, further details are unclear.  She passes 1-5 loose black stools daily which has been her typical bowel pattern for 2 to 3 years.  She sees a small amount of bright red blood on the toilet tissue 3 days weekly.  She passes a solid black stool once monthly.  She is on Linzess for chronic constipation.  If she stops taking Linzess she develops worsening constipation and she rather passes loose stools daily.  No alcohol use.  She has a decreased appetite. Low po intake. She has poorly fitting dentures with ulcers in her mouth which showed mitigating painful and less desirable.  She has lost 5 to 6 pounds over the past month.  She has ESRD initiated on hemodialysis every Monday/Wednesday/Friday about 1 month ago.  Her last dialysis session was yesterday. She denies having any CP or SOB at this time.  She is well-known to our GI practice with a multiple hospital admissions for hematochezia thought to be due to small bowel and colon AVMs.  Her most recent prior hospital admission was 08/15/2021 due to having acute respiratory failure due to possible pneumonia which was treated with antibiotics.  CTAP was done during this hospitalization which was somewhat suspicious for colitis which was treated with Flagyl.  Her admission hemoglobin level was 9 which dropped to 7.1 and she received 1 unit of PRBCs. She underwent a small bowel enteroscopy 08/18/2021 which was normal, no evidence of bleeding in stomach, duodenum and examined portion of the jejunum.  Small bowel capsule endoscopy during this admission showed a colonic erosion versus a polyp with erythema but was not actively bleeding.  No blood was seen in the colon.  Her respiratory status improved after completing 5-day course of Rocephin and Zithromax.  Due to her worsening renal status, she was transferred to [facility] and a hemodialysis catheter was placed and  dialysis was initiated on 08/21/2021. Subsequently, a left UE graft was placed for dialysis.   PAST GI PROCEDURES:  Small bowel capsule endoscopy 08/21/2021: Normal small bowel, no evidence of active bleeding no blood in the small bowel Colonic erosion versus polyp, erythematous but not bleeding No blood seen in the colon  Small bowel enteroscopy 08/18/2021 by Dr. Mechele Collin: - Normal esophagus. - Normal stomach. - Normal examined duodenum. - The examined portion of the jejunum was normal. - Successful completion of the Video Capsule Enteroscope placement. - No specimens collected.  Small bowel enteroscopy 06/12/2021 by Dr. Rush Landmark: - No gross lesions in esophagus. - Non-obstructing Schatzki ring. - 3 cm hiatal hernia. - No gross lesions in the stomach. - Normal mucosa was found in the entire examined duodenum. - Normal mucosa was found in the proximal jejunum. Tattooed distal extent.  Colonoscopy 04/12/2021 by Dr. Ardis Hughs: - One 8 mm polyp in the sigmoid colon, removed with a cold snare. Resected and retrieved. - Diverticulosis in the left colon. - The examination was otherwise normal on direct and retroflexion views. Biopsy Result: - HYPERPLASTIC POLYP. - NO ADENOMATOUS CHANGE OR CARCINOMA.   Enteroscopy August 27, 2020 while inpatient, Dr. Bryan Lemma.  The esophagus and stomach were normal.  Small AVM was found and treated with APC in the duodenum bulb.  A small AVM was found and treated with APC in the proximal jejunum. 08/06/2020 EGD Dr. Lyndel Safe with food residue in the stomach with outlet obstruction, otherwise normal.  No upper GI bleeding.   Colonoscopy August 27, 2020 while inpatient, Dr. Bryan Lemma.  "Fair" prep.  20 mm sessile serrated polyp in the ascending was removed with a hot snare, piecemeal technique.  Repeat colonoscopy in 3 months because the bowel preparation was poor and for surveillance after piecemeal polypectomy.   Colonoscopy 08/07/2020 by Dr. Lyndel Safe; with a  single nonbleeding colonic angiodysplastic lesion treated with APC, one 8 mm hyperplastic polyp in the distal sigmoid colon, minimal neosigmoid diverticulosis, patent end-to-end colocolonic anastomosis characterized by healthy-appearing mucosa, nonbleeding internal hemorrhoids.  It was felt the patient would likely need a repeat colonoscopy in 6 months with a 2-day preparation due to poor visualization.    Small bowel capsule endoscopy August 24, 2020 while inpatient showed small AVM in the duodenal bulb, small AVM in the proximal jejunum  Past Medical History:  Diagnosis Date   Anemia 2008   Anginal pain ([place])    Anxiety    Aortic stenosis    abominal aorta, s/p distal aortic stent 09/28/18, 12/01/20   Arthritis    "all my joints ache" (09/07/2014)   Asthma    Bipolar 1 disorder ([place])    Blood transfusion without reported diagnosis    Bradycardia    Bruit    Carpal tunnel syndrome    Cataract    forming   Cervical cancer ([place]) 1985   CHF (congestive heart failure) ([place])    Chronic kidney disease (CKD), stage V ([place])    COPD (chronic obstructive pulmonary disease) ([place]) 2000   Coronary artery disease    Daily headache    Depression 2000   Diverticulitis  2008   Emphysema of lung ([place])    GERD (gastroesophageal reflux disease)    Glaucoma    Heart murmur    History of colon polyps 2009   HLD (hyperlipidemia) 2013   Hypertension 2013   Hypovitaminosis D    IBS (irritable bowel syndrome) 2008   Myocardial infarction [facility])    2015   PAD (peripheral artery disease) ([place])    Pancreatitis 10/2011   Pneumonia 07/2014   RLS (restless legs syndrome)    Schizophrenia ([facility])    Shortness of breath    Skin cancer    Small bowel obstruction ([place]) 2008   Thyroid disease     Past Surgical History:  Procedure Laterality Date   ABDOMINAL ANGIOGRAM N/A 09/07/2014   Procedure: ABDOMINAL ANGIOGRAM;  Surgeon: Laverda Page, MD;  Location: [facility] CATH LAB;  Service: Cardiovascular;   Laterality: N/A;   ABDOMINAL AORTOGRAM W/LOWER EXTREMITY N/A 12/28/2020   Procedure: ABDOMINAL AORTOGRAM W/LOWER EXTREMITY;  Surgeon: Cherre Robins, MD;  Location: [place] CV LAB;  Service: Cardiovascular;  Laterality: N/A;   APPENDECTOMY  10/01/1994   AV FISTULA PLACEMENT Left 09/14/2021   Procedure: INSERTION OF ARTERIOVENOUS LEFT UPPER GORE-TEX GRAFT ARM;  Surgeon: Cherre Robins, MD;  Location: [facility] OR;  Service: Vascular;  Laterality: Left;   BIOPSY  08/06/2020   Procedure: BIOPSY;  Surgeon: Jackquline Denmark, MD;  Location: [facility] ENDOSCOPY;  Service: Endoscopy;;   COLON SURGERY  10/01/2006   6 inches of colon removed due to obstruction   COLONOSCOPY WITH PROPOFOL N/A 10/25/2016   Procedure: COLONOSCOPY WITH PROPOFOL;  Surgeon: Milus Banister, MD;  Location: [facility] ENDOSCOPY;  Service: Endoscopy;  Laterality: N/A;   COLONOSCOPY WITH PROPOFOL N/A 08/07/2020   Procedure: COLONOSCOPY WITH PROPOFOL;  Surgeon: Jackquline Denmark, MD;  Location: [facility] ENDOSCOPY;  Service: Endoscopy;  Laterality: N/A;   COLONOSCOPY WITH PROPOFOL N/A 08/27/2020   Procedure: COLONOSCOPY WITH PROPOFOL;  Surgeon: Lavena Bullion, DO;  Location: [facility] ENDOSCOPY;  Service: Gastroenterology;  Laterality: N/A;   CORONARY ANGIOPLASTY WITH STENT PLACEMENT  09/07/2014   "2"   ENTEROSCOPY N/A 08/27/2020   Procedure: ENTEROSCOPY;  Surgeon: Lavena Bullion, DO;  Location: [facility] ENDOSCOPY;  Service: Gastroenterology;  Laterality: N/A;  Push enteroscopy    ENTEROSCOPY N/A 06/12/2021   Procedure: ENTEROSCOPY;  Surgeon: Rush Landmark Telford Nab., MD;  Location: [facility] ENDOSCOPY;  Service: Gastroenterology;  Laterality: N/A;   ENTEROSCOPY N/A 08/18/2021   Procedure: ENTEROSCOPY;  Surgeon: Jerene Bears, MD;  [facility]: [facility] ENDOSCOPY;  Service: Gastroenterology;  Laterality: N/A;   ESOPHAGOGASTRODUODENOSCOPY (EGD) WITH PROPOFOL N/A 08/06/2020   Procedure: ESOPHAGOGASTRODUODENOSCOPY (EGD) WITH PROPOFOL;  Surgeon: Jackquline Denmark, MD;  [facility]  ENDOSCOPY;  Service: Endoscopy;  Laterality: N/A;   GIVENS CAPSULE STUDY N/A 08/24/2020   Procedure: GIVENS CAPSULE STUDY;  Surgeon: Lavena Bullion, DO;  [facility] ENDOS[facility];  Service: Gastroenterology;  Laterality: N/A;   GIVENS CAPSULE STUDY N/A 08/18/2021   Procedure: GIVENS CAPSULE;  Surgeon: Jerene Bears, MD;  [facility];  Service: Gastroenterology;  Laterality: N/A;   HOT HEMOSTASIS N/A 08/07/2020   Procedure: HOT HEMOSTASIS (ARGON PLASMA COAGULATION/BICAP);  Surgeon: Jackquline Denmark, MD;  Location: [facility];  Service: Endoscopy;  Laterality: N/A;   HOT HEMOSTASIS N/A 08/27/2020   Procedure: HOT HEMOSTASIS (ARGON PLASMA COAGULATION/BICAP);  Surgeon: Lavena Bullion, DO;  [facility];  Service: Gastroenterology;  Laterality: N/A;   IR FLUORO GUIDE CV LINE RIGHT  08/21/2021   IR US GUIDE VASC  ACCESS RIGHT  08/21/2021   LEFT HEART CATH AND CORONARY ANGIOGRAPHY N/A 03/25/2018   Procedure: LEFT HEART CATH AND CORONARY ANGIOGRAPHY;  Surgeon: Nigel Mormon, MD;  Location: [place] CV LAB;  Service: Cardiovascular;  Laterality: N/A;   LEFT HEART CATH AND CORONARY ANGIOGRAPHY N/A 05/09/2021   Procedure: LEFT HEART CATH AND CORONARY ANGIOGRAPHY;  Surgeon: Adrian Prows, MD;  Location: [place] CV LAB;  Service: Cardiovascular;  Laterality: N/A;   LEFT HEART CATHETERIZATION WITH CORONARY ANGIOGRAM N/A 09/07/2014   Procedure: LEFT HEART CATHETERIZATION WITH CORONARY ANGIOGRAM;  Surgeon: Laverda Page, MD;  Location[facility] CATH LAB;  Service: Cardiovascular;  Laterality: N/A;   LOWER EXTREMITY ANGIOGRAPHY  10/29/2017   Procedure: Lower Extremity Angiography;  Surgeon: Adrian Prows, MD;  Location: [place] CV LAB;  Service: Cardiovascular;;   PERIPHERAL VASCULAR CATHETERIZATION N/A 11/01/2015   Procedure: Renal Angiography;  Surgeon: Adrian Prows, MD;  Location: [place] CV LAB;  Service: Cardiovascular;  Laterality: N/A;   PERIPHERAL VASCULAR  CATHETERIZATION N/A 04/10/2016   Procedure: Renal Angiography;  Surgeon: Adrian Prows, MD;  Location: [place] CV LAB;  Service: Cardiovascular;  Laterality: N/A;   PERIPHERAL VASCULAR CATHETERIZATION  04/10/2016   Procedure: Peripheral Vascular Intervention;  Surgeon: Adrian Prows, MD;  Location: [place] CV LAB;  Service: Cardiovascular;;   PERIPHERAL VASCULAR INTERVENTION  10/29/2017   Procedure: PERIPHERAL VASCULAR INTERVENTION;  Surgeon: Adrian Prows, MD;  Location: [place] CV LAB;  Service: Cardiovascular;;   PERIPHERAL VASCULAR INTERVENTION Bilateral 12/28/2020   Procedure: PERIPHERAL VASCULAR INTERVENTION;  Surgeon: Cherre Robins, MD;  Location: [place] CV LAB;  Service: Cardiovascular;  Laterality: Bilateral;   POLYPECTOMY  08/07/2020   Procedure: POLYPECTOMY;  Surgeon: Jackquline Denmark, MD;  [facility];  Service: Endoscopy;;   POLYPECTOMY  08/27/2020   Procedure: POLYPECTOMY;  Surgeon: Lavena Bullion, DO;  [facility]: [facility];  Service: Gastroenterology;;   POLYPECTOMY     RENAL ANGIOGRAPHY N/A 10/29/2017   Procedure: RENAL ANGIOGRAPHY;  Surgeon: Adrian Prows, MD;  Location: [place] CV LAB;  Service: Cardiovascular;  Laterality: N/A;   RENAL ANGIOGRAPHY N/A 05/10/2020   Procedure: RENAL ANGIOGRAPHY;  Surgeon: Nigel Mormon, MD;  Location: [place] CV LAB;  Service: Cardiovascular;  Laterality: N/A;   RIGHT OOPHORECTOMY Right 10/01/1994   SUBMUCOSAL TATTOO INJECTION  06/12/2021   Procedure: SUBMUCOSAL TATTOO INJECTION;  Surgeon: Irving Copas., MD;  [facility];  Service: Gastroenterology;;   TOTAL ABDOMINAL HYSTERECTOMY  10/02/1995   UPPER GASTROINTESTINAL ENDOSCOPY      Prior to Admission medications   Medication Sig Start Date End Date Taking? Authorizing Provider  albuterol (VENTOLIN HFA) 108 (90 Base) MCG/ACT inhaler Inhale 2 puffs into the lungs every 6 (six) hours as needed for wheezing or shortness of breath (asthma).  08/22/21  Yes Aline August, MD  ALPRAZolam Duanne Moron) 1 MG tablet Take 1 mg by mouth 2 (two) times daily as needed for anxiety. 04/15/17  Yes [provider]  amLODipine (NORVASC) 10 MG tablet TAKE 1 TABLET BY MOUTH ONCE DAILY Patient taking differently: Take 10 mg by mouth daily. 04/13/21  Yes Cantwell, Celeste C, PA-C  atorvastatin (LIPITOR) 80 MG tablet Take 80 mg by mouth at bedtime.   Yes [provider]  azelastine (ASTELIN) 0.1 % nasal spray Place 1 spray into both nostrils daily as needed for allergies (seasonal allergies). 06/24/19  Yes [provider]  calcium acetate (PHOSLO) 667 MG capsule Take 2,001 mg by mouth See  admin instructions. Take 206m (3 capsules) by mouth three times a day with meals and 6644m(1 capsule) with a snack   Yes [provider]  Calcium Carb-Cholecalciferol (CALCIUM 600 + D PO) Take 1 tablet by mouth daily.   Yes [provider]  cetirizine (ZYRTEC) 10 MG tablet Take 10 mg by mouth daily as needed for allergies.   Yes [provider]  Ensure (ENSURE) Take 237 mLs by mouth 2 (two) times daily between meals.   Yes [provider]  ezetimibe (ZETIA) 10 MG tablet TAKE 1 TABLET BY MOUTH ONCE A DAY Patient taking differently: Take 10 mg by mouth daily. 05/03/21  Yes Cantwell, Celeste C, PA-C  isosorbide mononitrate (IMDUR) 30 MG 24 hr tablet Take 30 mg by mouth daily.   Yes [provider]  labetalol (NORMODYNE) 100 MG tablet Take 100 mg by mouth 2 (two) times daily.   Yes [provider]  LINZESS 72 MCG capsule TAKE 1 CAPSULE BY MOUTH ONCE DAILY BEFORE BREAKFAST Patient taking differently: Take 72 mcg by mouth daily before breakfast. 08/03/21  Yes LeLevin ErpPA  megestrol (MEGACE) 40 MG tablet Take 40 mg by mouth daily.   Yes [provider]  mometasone-formoterol (DULERA) 100-5 MCG/ACT AERO Take 2 puffs first thing in am and then another 2 puffs about 12 hours  later. Patient taking differently: Inhale 2 puffs into the lungs every 12 (twelve) hours. 04/26/17  Yes WeTanda RockersMD  nitroGLYCERIN (NITROSTAT) 0.4 MG SL tablet Place 1 tablet (0.4 mg total) under the tongue every 5 (five) minutes as needed for chest pain. 10/15/19  Yes KeMiquel DunnNP  ondansetron (ZOFRAN-ODT) 4 MG disintegrating tablet Take 1 tablet (4 mg total) by mouth every 8 (eight) hours as needed for nausea or vomiting. Patient taking differently: Take 4 mg by mouth daily. Alternate with Promethazine 07/25/21  Yes BuHayden RasmussenMD  oxyCODONE-acetaminophen (PERCOCET/ROXICET) 5-325 MG tablet Take 1 tablet by mouth 2 (two) times daily as needed for pain. 09/13/21 09/13/22 Yes [provider]  pantoprazole (PROTONIX) 40 MG tablet Take 1 tablet (40 mg total) by mouth daily. 08/22/21  Yes AlAline AugustMD  promethazine (PHENERGAN) 25 MG tablet Take 25 mg by mouth daily. Alternate with Ondansetron 08/16/20  Yes [provider]  traZODone (DESYREL) 100 MG tablet Take 50-100 mg by mouth at bedtime as needed for sleep.   Yes [provider]  venlafaxine XR (EFFEXOR XR) 150 MG 24 hr capsule Take 2 capsules (300 mg total) by mouth daily with breakfast. 05/12/21  Yes Arrien, MaJimmy PicketMD  vitamin B-12 (CYANOCOBALAMIN) 500 MCG tablet Take 500 mcg by mouth daily.   Yes [provider]    Current Facility-Administered Medications  Medication Dose Route Frequency Provider Last Rate Last Admin   0.9 %  sodium chloride infusion (Manually program via Guardrails IV Fluids)   Intravenous Once CoDonato HeinzMD       albuterol (PROVENTIL) (2.5 MG/3ML) 0.083% nebulizer solution 3 mL  3 mL Inhalation Q6H PRN ZhLequita HaltMD       ALPRAZolam (XDuanne Morontablet 1 mg  1 mg Oral BID PRN ZhWynetta Fines, MD       amLODipine (NORVASC) tablet 10 mg  10 mg Oral Daily ZhWynetta Fines, MD   10 mg at 09/19/21 0943   azelastine (ASTELIN) 0.1 % nasal spray 1 spray   1 spray Each Nare Daily PRN ZhLequita HaltMD  azithromycin (ZITHROMAX) tablet 500 mg  500 mg Oral Daily Regalado, Belkys A, MD   500 mg at 09/19/21 0943   calcium acetate (PHOSLO) capsule 2,001 mg  2,001 mg Oral TID WC Heloise Purpura, RPH   2,001 mg at 09/19/21 8527   calcium acetate (PHOSLO) capsule 667 mg  667 mg Oral With snacks Heloise Purpura, RPH   667 mg at 09/18/21 2310   calcium-vitamin D (OSCAL WITH D) 500-5 MG-MCG per tablet 1 tablet  1 tablet Oral Q breakfast Heloise Purpura, [facility]   1 tablet at 09/19/21 7824   Chlorhexidine Gluconate Cloth 2 % PADS 6 each  6 each Topical Q0600 Adelfa Koh, NP   6 each at 09/19/21 2353   diphenhydrAMINE (BENADRYL) injection 25 mg  25 mg Intravenous Once Donato Heinz, MD       feeding supplement (ENSURE ENLIVE / ENSURE PLUS) liquid 237 mL  237 mL Oral BID BM Wynetta Fines T, MD   237 mL at 09/19/21 0830   ipratropium-albuterol (DUONEB) 0.5-2.5 (3) MG/3ML nebulizer solution 3 mL  3 mL Nebulization Q6H WA Wynetta Fines T, MD   3 mL at 09/19/21 6144   isosorbide mononitrate (IMDUR) 24 hr tablet 30 mg  30 mg Oral Daily Wynetta Fines T, MD   30 mg at 09/19/21 0943   labetalol (NORMODYNE) tablet 100 mg  100 mg Oral BID Wynetta Fines T, MD   100 mg at 09/19/21 3154   linaclotide (LINZESS) capsule 72 mcg  72 mcg Oral QAC breakfast Wynetta Fines T, MD   72 mcg at 09/19/21 0944   loratadine (CLARITIN) tablet 10 mg  10 mg Oral Daily Wynetta Fines T, MD   10 mg at 09/19/21 0830   megestrol (MEGACE) tablet 40 mg  40 mg Oral Daily Wynetta Fines T, MD   40 mg at 09/19/21 0831   mometasone-formoterol (DULERA) 100-5 MCG/ACT inhaler 2 puff  2 puff Inhalation Q12H Wynetta Fines T, MD       ondansetron (ZOFRAN-ODT) disintegrating tablet 4 mg  4 mg Oral Q8H PRN Wynetta Fines T, MD       oxyCODONE-acetaminophen (PERCOCET/ROXICET) 5-325 MG per tablet 1 tablet  1 tablet Oral BID PRN Wynetta Fines T, MD   1 tablet at 09/19/21 1132   pantoprazole (PROTONIX) EC tablet 40  mg  40 mg Oral Daily Wynetta Fines T, MD   40 mg at 09/19/21 0086   predniSONE (DELTASONE) tablet 40 mg  40 mg Oral Q breakfast Wynetta Fines T, MD   40 mg at 09/19/21 0831   promethazine (PHENERGAN) tablet 25 mg  25 mg Oral Daily Wynetta Fines T, MD   25 mg at 09/19/21 0831   traZODone (DESYREL) tablet 50-100 mg  50-100 mg Oral QHS PRN Wynetta Fines T, MD   50 mg at 09/18/21 2310   venlafaxine XR (EFFEXOR-XR) 24 hr capsule 300 mg  300 mg Oral Q breakfast Wynetta Fines T, MD   300 mg at 09/19/21 7619   vitamin B-12 (CYANOCOBALAMIN) tablet 500 mcg  500 mcg Oral Daily Wynetta Fines T, MD   500 mcg at 09/19/21 5093    Allergies as of 09/18/2021 - Review Complete 09/18/2021  Allergen Reaction Noted   Doxycycline Anaphylaxis and Hives    Hydralazine Shortness Of Breath and Rash 08/16/2020   Aspirin Other (See Comments) 10/16/2011   Hydrocodone Nausea And Vomiting 08/11/2021   Ibuprofen Other (See Comments) 07/25/2021   Tylenol [acetaminophen] Other (See Comments) 07/25/2021  Iohexol Itching and Other (See Comments) 10/16/2011    Family History  Problem Relation Age of Onset   Other Mother        many bowel obstructions   Heart disease Mother    Colon polyps Mother    Kidney cancer Father    Bone cancer Father    Diabetes Father    Heart disease Father    Heart disease Brother    Colon cancer Paternal Grandfather    Diabetes Daughter    Esophageal cancer Neg Hx    Rectal cancer Neg Hx    Stomach cancer Neg Hx     Social History   Socioeconomic History   Marital status: Divorced    Spouse name: Not on file   Number of children: 1   Years of education: Not on file   Highest education level: Not on file  Occupational History   Occupation: Scientist, research (life sciences): UNEMPLOYED  Tobacco Use   Smoking status: Former    Packs/day: 1.50    Years: 40.00    Pack years: 60.00    Types: Cigarettes    Quit date: 08/2021    Years since quitting: 0.1   Smokeless tobacco: Never  Vaping  Use   Vaping Use: Former   Substances: THC  Substance and Sexual Activity   Alcohol use: No   Drug use: Yes    Frequency: 3.0 times per week    Types: Marijuana    Comment: last use 09/09/21   Sexual activity: Yes    Birth control/protection: Post-menopausal, Surgical    Comment: Hysterectomy  Other Topics Concern   Not on file  Social History Narrative   Not on file   Social Determinants of Health   Financial Resource Strain: Not on file  Food Insecurity: Not on file  Transportation Needs: Not on file  Physical Activity: Not on file  Stress: Not on file  Social Connections: Not on file  Intimate Partner Violence: Not on file    Review of Systems: See HPI, all other systems reviewed and were negative  Physical Exam: Vital signs in last 24 hours: Temp:  [97.6 F (36.4 C)-98.9 F (37.2 C)] 97.6 F (36.4 C) (12/20 0804) Pulse Rate:  [60-69] 66 (12/20 0804) Resp:  [17-23] 17 (12/20 0804) BP: (119-178)/(49-89) 161/49 (12/20 0804) SpO2:  [86 %-98 %] 86 % (12/20 0804) Weight:  [42.2 kg] 42.2 kg (12/19 1316) Last BM Date: 09/17/21 General:  Alert thin fatigued appearing 60 year old female in no acute distress. Head:  Normocephalic and atraumatic. Eyes:  No scleral icterus. Conjunctiva pink. Ears:  Normal auditory acuity. Nose:  No deformity, discharge or lesions. Mouth:Poor fitting upper and lower dentures.  Neck:  Supple. No lymphadenopathy or thyromegaly.  Lungs: Breath sounds clear, decreased in bases. On oxygen 2L [place]. Heart: Regular rate and rhythm, no murmurs. Abdomen:   Rectal: Deferred. Musculoskeletal:  Symmetrical without gross deformities.  Pulses:  Normal pulses noted. Extremities:  Without clubbing or edema. Neurologic:  Alert and  oriented x4. No focal deficits.  Skin:  Intact without significant lesions or rashes. Psych:  Alert and cooperative. Normal mood and affect.  Intake/Output from previous day: 12/19 0701 - 12/20 0700 In: 100 [P.O.:100] Out:  2000  Intake/Output this shift: No intake/output data recorded.  Lab Results: Recent Labs    09/18/21 1317 09/18/21 2226 09/19/21 0324  WBC 10.5  --  7.2  HGB 8.2* 8.0* 7.5*  HCT 26.2* 24.5* 23.9*  PLT 392  --  [place]    09/18/21 1317 09/19/21 0324  NA 134* 136  K 4.7 4.1  CL 98 100  CO2 24 25  GLUCOSE 125* 121*  BUN 40* 16  CREATININE 4.73* 2.60*  CALCIUM 8.7* 8.6*   LFT Recent Labs    09/19/21 0324  PROT 5.7*  ALBUMIN 2.4*  AST 96*  ALT 53*  ALKPHOS 170*  BILITOT 0.7   PT/INR No results for input(s): LABPROT, INR in the last 72 hours. Hepatitis Panel Recent Labs    09/18/21 1546  HEPBSAG NON REACTIVE      Studies/Results: CT ABDOMEN PELVIS WO CONTRAST  Result Date: 09/18/2021 CLINICAL DATA:  Abdominal pain, acute, nonlocalized. Shortness of breath for 3-4 weeks with hypoxia. Missed dialysis EXAM: CT ABDOMEN AND PELVIS WITHOUT CONTRAST TECHNIQUE: Multidetector CT imaging of the abdomen and pelvis was performed following the standard protocol without IV contrast. COMPARISON:  08/16/2021 FINDINGS: Lower chest: Small bilateral pleural effusions with associated atelectasis or consolidation within the lingula and bilateral lower lobes. Mild cardiomegaly. Extensive coronary artery calcification. Trace pericardial effusion. Relative hypoattenuation of the cardiac blood pool indicative of anemia. Hepatobiliary: Unremarkable unenhanced appearance of the liver. No focal liver lesion identified. Gallbladder within normal limits. No hyperdense gallstone. No biliary dilatation. Pancreas: Grossly unremarkable, although evaluation is limited in the absence of intravenous contrast and relative paucity of intra-fat. Spleen: Normal in size without focal abnormality. Adrenals/Urinary Tract: Unremarkable adrenal glands. Advanced left renal atrophy. No renal stone or hydronephrosis. Urinary bladder unremarkable for the degree of distension. Stomach/Bowel: Evaluation  limited in the absence of intravenous or enteric contrast. Stomach is within normal limits. No evidence of bowel wall thickening, distention, or inflammatory changes. Vascular/Lymphatic: Severe, age advanced atherosclerotic calcifications throughout the aortoiliac axis. Bilateral iliac stents. No abdominopelvic lymphadenopathy is evident. Reproductive: Status post hysterectomy. No adnexal masses. Other: No free fluid. No abdominopelvic fluid collection. No pneumoperitoneum. No abdominal wall hernia. Musculoskeletal: Diffuse anasarca.  No acute osseous abnormality. IMPRESSION: 1. Small bilateral pleural effusions with associated atelectasis or consolidation within the lingula and bilateral lower lobes. 2. No acute findings identified within the abdomen or pelvis. 3. Diffuse anasarca. 4. Severe, age advanced atherosclerotic calcifications throughout the aortoiliac axis (ICD10-I70.0). Electronically Signed   By: Davina Poke D.O.   On: 09/18/2021 14:16   DG Chest 2 View  Result Date: 09/18/2021 CLINICAL DATA:  Shortness of breath EXAM: CHEST - 2 VIEW COMPARISON:  Chest radiograph dated August 18, 2021. FINDINGS: The heart is enlarged. Small bilateral pleural effusions. Bibasilar opacities which may represent pulmonary edema or infiltrate. Right IJ access double-lumen catheter is noted. The osseous structures are unremarkable. IMPRESSION: Stable cardiomegaly with small bilateral pleural effusions. Bibasilar opacities which may represent atelectasis or infiltrate. Electronically Signed   By: Keane Police D.O.   On: 09/18/2021 14:00    IMPRESSION/PLAN:  53) 60 year old female with a history of mesenteric ischemia, diverticular bleed, small bowel AVMs readmitted to the hospital 09/18/2021 with SOB, CP, chronic anemia with reported chronic melenic stool and bright red rectal bleeding.  Admission hemoglobin 8.2 -> 7.5 this am.  Iron 60.  One unit of PRBCs ordered, not yet transfused.  No active GI bleeding  since admission.  Small bowel capsule endoscopy and small bowel enteroscopy completed during her hospital admission 08/2021 without evidence of active upper or lower GI bleeding.  -Check H&H posttransfusion -Monitor H&H closely -Transfuse for hemoglobin less than 8 -CBC in AM -PPI 40 mg p.o. daily -  Soft to pured diet as tolerated -Recommend nutritional consult -Defer recommendations regarding endoscopic evaluation to Dr. Rush Landmark   2) COPD exacerbation on Azithromycin and Prednisone. Oxygen [place] as needed.   3) History of a hyperplastic colon polyp per colonoscopy 03/2021.  Small bowel capsule endoscopy 08/21/2021 showed a colonic erosion versus a polyp.   4) ESRD on HD. Last dialysis session was 09/18/2021.  5) Elevated LFTs. CTAP without contrast 09/18/2021 showed a normal liver without evidence of biliary ductal dilatation.  Alk phos 170.  AST 96.  ALT 53.  Total bili 0.7.  Hepatitis B surface antigen nonreactive. HIV negative.  -Hepatic panel, Hep C antibody, Hep B core IgM, Hep A IgM, ANA, ceruloplasmin, SMA, AMA and IgG in am  6) CAD, MI 2015  7) Significant peripheral arterial disease with chronic mesenteric ischemia s/p SMA stent, terminal aortic stent and bilateral common iliac artery stent 12/28/2020. No longer on Plavix or ASA secondary to chronic GI blood loss.   Patrecia Pour Kennedy-Smith  09/19/2021, 1:10 PM

## 2021-09-19 NOTE — Progress Notes (Signed)
Pt has orders for continuous cardiac monitoring. Unit is currently out of tele monitors , other units were called but currently there are no extra tele monitors available at this time. Will continue to monitor patient.

## 2021-09-19 NOTE — Progress Notes (Signed)
Initial Nutrition Assessment  DOCUMENTATION CODES:   Underweight, Severe malnutrition in context of chronic illness  INTERVENTION:   Encourage good PO intake  Multivitamin w/ minerals daily. Ensure Enlive po BID, each supplement provides 350 kcal and 20 grams of protein Downgrade diet to Dysphagia 3 due to poor dentition.   NUTRITION DIAGNOSIS:   Severe Malnutrition related to chronic illness (COPD, CHF, ESRD) as evidenced by severe muscle depletion, severe fat depletion.  GOAL:   Patient will meet greater than or equal to 90% of their needs  MONITOR:   PO intake, Supplement acceptance, Labs, Weight trends  REASON FOR ASSESSMENT:   Consult Assessment of nutrition requirement/status  ASSESSMENT:   60 y.o female presented to the ED with shortness of breath and a cough. PMH includes ESRD on HD (MWF), COPD, IBS w/ diarrhea, HTN, CHF, and diverticulitis. Pt admitted with COPD exacerbation and acute hypoxic respite failure.    Pt reports that pt her dentures are poor fitting and that they hurt when eating, rub and hurt gums. Pt reports that this has been going on for years and has not been to be resized. RD discussed possibility of downgrading diet to Dys 3 diet to help with chewing; pt agreeable to. Pt reports that she typically drinks 2 Ensures/day and has 1 meal./day. Some days she will eat 2 meals/ day, but not too much. Pt reports that her chair time for HD is at 12:10 and that sometimes she will eat prior or will eat while there, depends on her day. Pt reports taking her binders whenever she eat a meal or has a snack.    Pt reports that ~2 years ago she was 130# and now currently weighs 93#. Per EMR, pt has had 7% weight loss within 1 months, significant for time frame. Although, unable to determine if weight loss is related to fluid retention or actual dry weight loss. Pt reports that she retains a lot of fluid between each dialysis treatment.   Discussed ONS with pt, pt  agreeable to continue ONS while admitted. Pt reports difficulty in ordering meals; RD will make assist.  Per Nephrology note, pt is a new HD patient, on MWF schedule. EDW: 42 kg (adjusted on 12/16)  Currently being worked up for possible C. Diff.   Medications reviewed and include: Zithromax, Phoslo, Calcium-Vitamin D, Megace, Protonix, Prednisone, Vitamin B12 Labs reviewed.   HD on 12/19  Net UF: 2 L  NUTRITION - FOCUSED PHYSICAL EXAM:  Flowsheet Row Most Recent Value  Orbital Region Severe depletion  Upper Arm Region Severe depletion  Thoracic and Lumbar Region Severe depletion  Buccal Region Moderate depletion  Temple Region Moderate depletion  Clavicle Bone Region Severe depletion  Clavicle and Acromion Bone Region Severe depletion  Scapular Bone Region Severe depletion  Dorsal Hand Severe depletion  Patellar Region Severe depletion  Anterior Thigh Region Severe depletion  Posterior Calf Region Severe depletion  Edema (RD Assessment) None  Hair Reviewed  Eyes Reviewed  Mouth Reviewed  Skin Reviewed  Nails Reviewed       Diet Order:   Diet Order             Diet renal with fluid restriction Fluid restriction: 1200 mL Fluid; Room service appropriate? Yes; Fluid consistency: Thin  Diet effective now                   EDUCATION NEEDS:   No education needs have been identified at this time  Skin:  Skin  Assessment: Reviewed RN Assessment  Last BM:  09/17/2021  Height:   Ht Readings from Last 1 Encounters:  09/18/21 5\' 1"  (1.549 m)    Weight:   Wt Readings from Last 1 Encounters:  09/18/21 42.2 kg    Ideal Body Weight:  47.7 kg  BMI:  Body mass index is 17.57 kg/m.  Estimated Nutritional Needs:   Kcal:  1400-1600  Protein:  70-85 grams  Fluid:  UOP + 1 L    Lainy Wrobleski Louie Casa, RD, LDN Clinical Dietitian See [facility] for contact information.

## 2021-09-19 NOTE — Evaluation (Signed)
Physical Therapy Evaluation Patient Details Name: [name] MRN: [id number] DOB: [birth year] Today's Date: 09/19/2021  History of Present Illness  [name] is a 60 y.o. female admitted 09/18/21 with SOB and cough, fever, maroon diarrhea, abdominal cramping. CXR shows bibasilar opacities consistent with atelectasis vs. Infiltrates. Admitted with acute respitatory failure secondary to COPD exacerbation likely due to noncompliant home oxgyen use. PMH includes: CKD stage V recently started HD, COPD Gold stage III noncompliant with home oxygen, IBS with chronic diarrhea, HTN, chronic diastolic CHF, CAD, PVD, s/p  bilateral common iliac stenting, severe protein calorie malnutrition, chronic anemia secondary to CKD.  Clinical Impression  Patient presents with generalized weakness, impaired balance, impaired cognition, decreased cardiopulmonary status and impaired mobility s/p above. Pt lives at home with her bf and grandson; reports needing some assist with ADLs and limited ambulator with [facility] as needed. Reports hx of falls and only wears 02 at night. Not able to clarify when she is supposed to wear it. Today, pt tolerated gait training with Min A for balance due to unsteadiness. Limited by 2-3/4 DOE and fatigue. Education re: importance of short bouts of activity with longer rest breaks, wearing 02, getting pulse ox, energy conservation techniques etc. Might benefit from a rollator. Will attempt next session. Will follow acutely to maximize independence and mobility prior to return home.       Recommendations for follow up therapy are one component of a multi-disciplinary discharge planning process, led by the attending physician.  Recommendations may be updated based on patient status, additional functional criteria and insurance authorization.  Follow Up Recommendations No PT follow up    Assistance Recommended at Discharge Intermittent Supervision/Assistance  Functional Status Assessment  Patient has had a recent decline in their functional status and demonstrates the ability to make significant improvements in function in a reasonable and predictable amount of time.  Equipment Recommendations  Other (comment) (will attempt rollator next session)    Recommendations for Other Services       Precautions / Restrictions Precautions Precautions: Fall;Other (comment) Precaution Comments: watch O2 Restrictions Weight Bearing Restrictions: No      Mobility  Bed Mobility Overal bed mobility: Modified Independent             General bed mobility comments: No assist needed.    Transfers Overall transfer level: Needs assistance Equipment used: None Transfers: Sit to/from Stand Sit to Stand: Min guard           General transfer comment: Min guard for safety. MIldly unsteady once upright.    Ambulation/Gait Ambulation/Gait assistance: Min assist Gait Distance (Feet): 120 Feet Assistive device: None Gait Pattern/deviations: Step-through pattern;Decreased stride length;Narrow base of support;Scissoring;Staggering left;Staggering right Gait velocity: decreased Gait velocity interpretation: <1.31 ft/sec, indicative of household ambulator   General Gait Details: Slow, unsteady gait with narrow BoS, instances of scissoring and staggering noted. 2-3/4 DOE. Sp02 stayed >92% on 2L/min 02 [place].  Stairs            Wheelchair Mobility    Modified Rankin (Stroke Patients Only)       Balance Overall balance assessment: Needs assistance;History of Falls Sitting-balance support: Feet supported;No upper extremity supported Sitting balance-Leahy Scale: Good Sitting balance - Comments: Able to donn socks without difficulty.   Standing balance support: During functional activity Standing balance-Leahy Scale: Fair Standing balance comment: Close Min guard-Min A for safety/balance during dynamic tasks.  Pertinent Vitals/Pain  Pain Assessment: 0-10 Pain Score: 5  Pain Location: abdomen Pain Descriptors / Indicators: Sore;Aching Pain Intervention(s): Monitored during session;Repositioned    Home Living Family/patient expects to be discharged to:: Private residence Living Arrangements: Other relatives (61 yo grandson, bf) Available Help at Discharge: Family;Available PRN/intermittently Type of Home: House Home Access: Stairs to enter;Ramped entrance Entrance Stairs-Rails: Right Entrance Stairs-Number of Steps: 3 in front, ramp in back   Home Layout: One level Home Equipment: Cane - single point;Shower seat      Prior Function Prior Level of Function : Needs assist;Independent/Modified Independent       Physical Assist : ADLs (physical)     Mobility Comments: Walks independently, does not work. Helps with IADLs. Wears 02 at night. Reports lots of falls at home. ADLs Comments: Has needed help with ADLs. Does bird baths     Hand Dominance   Dominant Hand: Right    Extremity/Trunk Assessment   Upper Extremity Assessment Upper Extremity Assessment: Defer to OT evaluation    Lower Extremity Assessment Lower Extremity Assessment: Generalized weakness;RLE deficits/detail;LLE deficits/detail;Difficult to assess due to impaired cognition RLE Sensation: decreased light touch LLE Sensation: decreased light touch    Cervical / Trunk Assessment Cervical / Trunk Assessment: Normal  Communication   Communication: No difficulties  Cognition Arousal/Alertness: Awake/alert Behavior During Therapy: WFL for tasks assessed/performed Overall Cognitive Status: Impaired/Different from baseline Area of Impairment: Memory;Safety/judgement;Problem solving;Awareness                     Memory: Decreased short-term memory   Safety/Judgement: Decreased awareness of safety;Decreased awareness of deficits Awareness: Emergent Problem Solving: Slow processing;Requires verbal cues General Comments: "I am  confused this morning. I cannot remember where i live." Laughing to mask inability to answer some questions. A&Ox4 however doe snot recall coming into hospital. Difficult historian.        General Comments General comments (skin integrity, edema, etc.): Sp02 85% on RA at rest sitting in bed, donned 02 and able to maintain in 90s.    Exercises     Assessment/Plan    PT Assessment Patient needs continued PT services  PT Problem List Decreased strength;Decreased mobility;Decreased safety awareness;Pain;Impaired sensation;Decreased balance;Decreased activity tolerance;Decreased cognition;Cardiopulmonary status limiting activity       PT Treatment Interventions Therapeutic activities;Therapeutic exercise;Patient/family education;Balance training;Functional mobility training;Gait training;DME instruction;Cognitive remediation    PT Goals (Current goals can be found in the Care Plan section)  Acute Rehab PT Goals Patient Stated Goal: to go home PT Goal Formulation: With patient Time For Goal Achievement: 10/03/21 Potential to Achieve Goals: Good    Frequency Min 3X/week   Barriers to discharge Decreased caregiver support      Co-evaluation               AM-PAC PT "6 Clicks" Mobility  Outcome Measure Help needed turning from your back to your side while in a flat bed without using bedrails?: None Help needed moving from lying on your back to sitting on the side of a flat bed without using bedrails?: None Help needed moving to and from a bed to a chair (including a wheelchair)?: A Little Help needed standing up from a chair using your arms (e.g., wheelchair or bedside chair)?: A Little Help needed to walk in hospital room?: A Little Help needed climbing 3-5 steps with a railing? : A Little 6 Click Score: 20    End of Session Equipment Utilized During Treatment: Gait belt;Oxygen Activity Tolerance: Patient limited  by fatigue;Patient tolerated treatment well Patient left: in  bed;with call bell/phone within reach;with bed alarm set (sitting EOB) Nurse Communication: Mobility status PT Visit Diagnosis: Muscle weakness (generalized) (M62.81);Unsteadiness on feet (R26.81);Other abnormalities of gait and mobility (R26.89)    Time: 9450-3888 PT Time Calculation (min) (ACUTE ONLY): 23 min   Charges:   PT Evaluation $PT Eval Moderate Complexity: 1 Mod PT Treatments $Gait Training: 8-22 mins        Marisa Severin, PT, DPT Acute Rehabilitation Services Pager [phone] Office [place] 09/19/2021, 9:48 AM

## 2021-09-19 NOTE — Progress Notes (Signed)
PROGRESS NOTE    [name]  FWY:[id number] DOB: Jan 27, 1961 DOA: 09/18/2021 PCP: Bernerd Limbo, MD   Brief Narrative: 60 year old with past medical history significant of CKD stage V, recently started on HD, COPD, stage III noncompliant with home oxygen, IBS with chronic diarrhea, hypertension, chronic diastolic heart failure, CAD, PVD, status post bilateral common iliac stenting, severe protein caloric malnutrition, chronic anemia secondary to CKD and GI bleed who presents complaining of increased shortness of breath and cough.  Patient reports intermittent cough for 3 to 4 weeks worse over the weekend. Also report chronic migraine: Right diarrhea 3 or 6 times a day associated with abdominal cramping pain.  Family called EMS the morning of admission and patient was found to have oxygen saturation 65% she was placed on 4 L of oxygen.  Chest x-ray showed cardiomegaly and mild pulmonary congestion.  CT abdomen pelvis and chest showed mild bilateral pulmonary congestion with a small bilateral effusion.  Presents  with a hemoglobin at 8.2.  Assessment & Plan:   Principal Problem:   COPD (chronic obstructive pulmonary disease) (HCC)   1-Acute Hypoxic Respiratory Failure: Multifactorial COPD exacerbation and pulmonary edema from  ESRD -Patient presented with hypoxia oxygen saturations at 65%, she was placed on 4 L of oxygen. -Underwent  hemodialysis 12/19 -Noncompliant with home oxygen.  She started to smoke again last week. -Currently on 2 L oxygen.   2-Acute on chronic COPD exacerbation: She received IV Solu-Medrol in the ED Continue with oral prednisone, azithromycin, Dulera, DuoNeb Continue with 2 L of oxygen supplementation  3-Acute on chronic blood loss secondary to chronic GI bleed, anemia of  CKD: -Presented with a hemoglobin of 8--- trending down to 7.5 -She continues to report black stool -Plan to transfuse 1 unit of packed red blood cell tomorrow with hemodialysis. -Prior  GI work up 08/2021; unremarkable endoscopy 08/18/2021, capsule endoscopy showed no evidence of active bleeding, nor blood in the small bowel, colonic erosion versus polyp but no bleeding. -Continue with PPI. -We will inform GI of patient admission.  4-Chronic Diarrhea GI pathogen and lactoferrin ordered. Stool fat to rule out chronic pancreatic insufficiency  ordered.  5-ESRD on hemodialysis: Recently started on hemodialysis. Nephrology consulted, patient underwent hemodialysis 12/19. For dialysis again tomorrow  6-Acute transaminitis: CT abdomen and pelvis: No acute finding or hepatic biliary system. -Follow trend -Liver function test trending down. -Hepatitis B surface antigen nonreactive  7-PVD: Need to follow-up with vascular surgery and cardiology to discuss with her when to start Plavix.  8-History of chronic mesenteric ischemia: Monitor for symptoms denies abdominal pain when she eats  Severe protein caloric malnutrition: Continue with supplements   Estimated body mass index is 17.57 kg/m as calculated from the following:   Height as of this encounter: 5\' 1"  (1.549 m).   Weight as of this encounter: 42.2 kg.   DVT prophylaxis: SCDs Code Status: Full code Family Communication: Care discussed with patient Disposition Plan:  Status is: Inpatient  Remains inpatient appropriate because: Patient admitted with acute on chronic hypoxic respiratory failure, concern for persistent GI bleed.  Monitor hemoglobin.        Consultants:  Nephrology   Procedures:  HD  Antimicrobials:    Subjective: She is alert, report chronic abdominal pain for  years.  She is breathing better. She smoke cigaret last week.   Objective: Vitals:   09/18/21 2147 09/18/21 2200 09/19/21 0327 09/19/21 0804  BP: (!) 164/89 (!) 167/80 (!) 148/67 (!) 161/49  Pulse:  67  65 66  Resp: 19 17 19 17   Temp:  98.3 F (36.8 C) 98.9 F (37.2 C) 97.6 F (36.4 C)  TempSrc:  Oral    SpO2:  95%  90% (!) 86%  Weight:      Height:        Intake/Output Summary (Last 24 hours) at 09/19/2021 1255 Last data filed at 09/19/2021 0500 Gross per 24 hour  Intake 100 ml  Output 2000 ml  Net -1900 ml   Filed Weights   09/18/21 1316  Weight: 42.2 kg    Examination:  General exam: Appears calm and comfortable  Respiratory system: no wheezing, BL crackles.  Cardiovascular system: S1 & S2 heard, RRR.  Gastrointestinal system: Abdomen is nondistended, soft and nontender. No organomegaly or masses felt. Normal bowel sounds heard. Central nervous system: Alert and oriented. No focal neurological deficits. Extremities: Symmetric 5 x 5 power. Skin: No rashes, lesions or ulcers     Data Reviewed: I have personally reviewed following labs and imaging studies  CBC: Recent Labs  Lab 09/14/21 0811 09/18/21 1317 09/18/21 2226 09/19/21 0324  WBC  --  10.5  --  7.2  NEUTROABS  --  8.4*  --   --   HGB 10.9* 8.2* 8.0* 7.5*  HCT 32.0* 26.2* 24.5* 23.9*  MCV  --  98.5  --  98.8  PLT  --  392  --  099   Basic Metabolic Panel: Recent Labs  Lab 09/14/21 0811 09/18/21 1317 09/19/21 0324  NA 136 134* 136  K 5.1 4.7 4.1  CL 102 98 100  CO2  --  24 25  GLUCOSE 92 125* 121*  BUN 30* 40* 16  CREATININE 3.20* 4.73* 2.60*  CALCIUM  --  8.7* 8.6*  PHOS  --   --  3.7   GFR: Estimated Creatinine Clearance: 15.7 mL/min (A) (by C-G formula based on SCr of 2.6 mg/dL (H)). Liver Function Tests: Recent Labs  Lab 09/18/21 1317 09/19/21 0324  AST 111* 96*  ALT 50* 53*  ALKPHOS 189* 170*  BILITOT 0.9 0.7  PROT 6.2* 5.7*  ALBUMIN 2.6* 2.4*   Recent Labs  Lab 09/18/21 1317  LIPASE 34   No results for input(s): AMMONIA in the last 168 hours. Coagulation Profile: No results for input(s): INR, PROTIME in the last 168 hours. Cardiac Enzymes: No results for input(s): CKTOTAL, CKMB, CKMBINDEX, TROPONINI in the last 168 hours. BNP (last 3 results) No results for input(s): PROBNP in the  last 8760 hours. HbA1C: No results for input(s): HGBA1C in the last 72 hours. CBG: No results for input(s): GLUCAP in the last 168 hours. Lipid Profile: No results for input(s): CHOL, HDL, LDLCALC, TRIG, CHOLHDL, LDLDIRECT in the last 72 hours. Thyroid Function Tests: No results for input(s): TSH, T4TOTAL, FREET4, T3FREE, THYROIDAB in the last 72 hours. Anemia Panel: Recent Labs    09/18/21 1516 09/18/21 2226  FERRITIN 1,304*  --   TIBC 270  --   IRON 60  --   RETICCTPCT  --  5.8*   Sepsis Labs: Recent Labs  Lab 09/18/21 1318 09/18/21 2226  LATICACIDVEN 1.0 1.4    Recent Results (from the past 240 hour(s))  Resp Panel by RT-PCR (Flu A&B, Covid) Nasopharyngeal Swab     Status: None   Collection Time: 09/18/21  1:20 PM   Specimen: Nasopharyngeal Swab; Nasopharyngeal(NP) swabs in vial transport medium  Result Value Ref Range Status   SARS Coronavirus 2 by RT PCR NEGATIVE NEGATIVE Final  Comment: (NOTE) SARS-CoV-2 target nucleic acids are NOT DETECTED.  The SARS-CoV-2 RNA is generally detectable in upper respiratory specimens during the acute phase of infection. The lowest concentration of SARS-CoV-2 viral copies this assay can detect is 138 copies/mL. A negative result does not preclude SARS-Cov-2 infection and should not be used as the sole basis for treatment or other patient management decisions. A negative result may occur with  improper specimen collection/handling, submission of specimen other than nasopharyngeal swab, presence of viral mutation(s) within the areas targeted by this assay, and inadequate number of viral copies(<138 copies/mL). A negative result must be combined with clinical observations, patient history, and epidemiological information. The expected result is Negative.  Fact Sheet for Patients:  EntrepreneurPulse.com.au  Fact Sheet for Healthcare Providers:  IncredibleEmployment.be  This test is no t yet  approved or cleared by the [place] FDA and  has been authorized for detection and/or diagnosis of SARS-CoV-2 by FDA under an Emergency Use Authorization (EUA). This EUA will remain  in effect (meaning this test can be used) for the duration of the COVID-19 declaration under Section 564(b)(1) of the Act, 21 U.S.C.section 360bbb-3(b)(1), unless the authorization is terminated  or revoked sooner.       Influenza A by PCR NEGATIVE NEGATIVE Final   Influenza B by PCR NEGATIVE NEGATIVE Final    Comment: (NOTE) The Xpert Xpress SARS-CoV-2/FLU/RSV plus assay is intended as an aid in the diagnosis of influenza from Nasopharyngeal swab specimens and should not be used as a sole basis for treatment. Nasal washings and aspirates are unacceptable for Xpert Xpress SARS-CoV-2/FLU/RSV testing.  Fact Sheet for Patients: EntrepreneurPulse.com.au  Fact Sheet for Healthcare Providers: IncredibleEmployment.be  This test is not yet approved or cleared by the [place] FDA and has been authorized for detection and/or diagnosis of SARS-CoV-2 by FDA under an Emergency Use Authorization (EUA). This EUA will remain in effect (meaning this test can be used) for the duration of the COVID-19 declaration under Section 564(b)(1) of the Act, 21 U.S.C. section 360bbb-3(b)(1), unless the authorization is terminated or revoked.  Performed at [facility], [place] [address] [place], [place] [address]          Radiology Studies: CT ABDOMEN PELVIS WO CONTRAST  Result Date: 09/18/2021 CLINICAL DATA:  Abdominal pain, acute, nonlocalized. Shortness of breath for 3-4 weeks with hypoxia. Missed dialysis EXAM: CT ABDOMEN AND PELVIS WITHOUT CONTRAST TECHNIQUE: Multidetector CT imaging of the abdomen and pelvis was performed following the standard protocol without IV contrast. COMPARISON:  08/16/2021 FINDINGS: Lower chest: Small bilateral pleural effusions with  associated atelectasis or consolidation within the lingula and bilateral lower lobes. Mild cardiomegaly. Extensive coronary artery calcification. Trace pericardial effusion. Relative hypoattenuation of the cardiac blood pool indicative of anemia. Hepatobiliary: Unremarkable unenhanced appearance of the liver. No focal liver lesion identified. Gallbladder within normal limits. No hyperdense gallstone. No biliary dilatation. Pancreas: Grossly unremarkable, although evaluation is limited in the absence of intravenous contrast and relative paucity of intra-fat. Spleen: Normal in size without focal abnormality. Adrenals/Urinary Tract: Unremarkable adrenal glands. Advanced left renal atrophy. No renal stone or hydronephrosis. Urinary bladder unremarkable for the degree of distension. Stomach/Bowel: Evaluation limited in the absence of intravenous or enteric contrast. Stomach is within normal limits. No evidence of bowel wall thickening, distention, or inflammatory changes. Vascular/Lymphatic: Severe, age advanced atherosclerotic calcifications throughout the aortoiliac axis. Bilateral iliac stents. No abdominopelvic lymphadenopathy is evident. Reproductive: Status post hysterectomy. No adnexal masses. Other: No free fluid. No abdominopelvic  fluid collection. No pneumoperitoneum. No abdominal wall hernia. Musculoskeletal: Diffuse anasarca.  No acute osseous abnormality. IMPRESSION: 1. Small bilateral pleural effusions with associated atelectasis or consolidation within the lingula and bilateral lower lobes. 2. No acute findings identified within the abdomen or pelvis. 3. Diffuse anasarca. 4. Severe, age advanced atherosclerotic calcifications throughout the aortoiliac axis (ICD10-I70.0). Electronically Signed   By: Davina Poke D.O.   On: 09/18/2021 14:16   DG Chest 2 View  Result Date: 09/18/2021 CLINICAL DATA:  Shortness of breath EXAM: CHEST - 2 VIEW COMPARISON:  Chest radiograph dated August 18, 2021.  FINDINGS: The heart is enlarged. Small bilateral pleural effusions. Bibasilar opacities which may represent pulmonary edema or infiltrate. Right IJ access double-lumen catheter is noted. The osseous structures are unremarkable. IMPRESSION: Stable cardiomegaly with small bilateral pleural effusions. Bibasilar opacities which may represent atelectasis or infiltrate. Electronically Signed   By: Keane Police D.O.   On: 09/18/2021 14:00        Scheduled Meds:  sodium chloride   Intravenous Once   amLODipine  10 mg Oral Daily   azithromycin  500 mg Oral Daily   calcium acetate  2,001 mg Oral TID WC   calcium acetate  667 mg Oral With snacks   calcium-vitamin D  1 tablet Oral Q breakfast   Chlorhexidine Gluconate Cloth  6 each Topical Q0600   diphenhydrAMINE  25 mg Intravenous Once   feeding supplement  237 mL Oral BID BM   ipratropium-albuterol  3 mL Nebulization Q6H WA   isosorbide mononitrate  30 mg Oral Daily   labetalol  100 mg Oral BID   linaclotide  72 mcg Oral QAC breakfast   loratadine  10 mg Oral Daily   megestrol  40 mg Oral Daily   mometasone-formoterol  2 puff Inhalation Q12H   pantoprazole  40 mg Oral Daily   predniSONE  40 mg Oral Q breakfast   promethazine  25 mg Oral Daily   venlafaxine XR  300 mg Oral Q breakfast   vitamin B-12  500 mcg Oral Daily   Continuous Infusions:   LOS: 1 day    Time spent: 35 minutes.     Elmarie Shiley, MD [facility]   If 7PM-7AM, please contact night-coverage [email or web address]  09/19/2021, 12:55 PM

## 2021-09-20 DIAGNOSIS — K551 Chronic vascular disorders of intestine: Secondary | ICD-10-CM

## 2021-09-20 DIAGNOSIS — I5033 Acute on chronic diastolic (congestive) heart failure: Secondary | ICD-10-CM

## 2021-09-20 DIAGNOSIS — G894 Chronic pain syndrome: Secondary | ICD-10-CM

## 2021-09-20 DIAGNOSIS — J9621 Acute and chronic respiratory failure with hypoxia: Secondary | ICD-10-CM

## 2021-09-20 DIAGNOSIS — K5909 Other constipation: Secondary | ICD-10-CM

## 2021-09-20 DIAGNOSIS — K589 Irritable bowel syndrome without diarrhea: Secondary | ICD-10-CM

## 2021-09-20 DIAGNOSIS — N186 End stage renal disease: Secondary | ICD-10-CM

## 2021-09-20 DIAGNOSIS — F419 Anxiety disorder, unspecified: Secondary | ICD-10-CM

## 2021-09-20 DIAGNOSIS — D62 Acute posthemorrhagic anemia: Secondary | ICD-10-CM

## 2021-09-20 DIAGNOSIS — K921 Melena: Secondary | ICD-10-CM

## 2021-09-20 DIAGNOSIS — E43 Unspecified severe protein-calorie malnutrition: Secondary | ICD-10-CM

## 2021-09-20 LAB — HEPATITIS B CORE ANTIBODY, IGM: Hep B C IgM: NONREACTIVE

## 2021-09-20 LAB — HEPATIC FUNCTION PANEL
ALT: 40 U/L (ref 0–44)
AST: 57 U/L — ABNORMAL HIGH (ref 15–41)
Albumin: 2.7 g/dL — ABNORMAL LOW (ref 3.5–5.0)
Alkaline Phosphatase: 159 U/L — ABNORMAL HIGH (ref 38–126)
Bilirubin, Direct: <0.1 mg/dL (ref 0.0–0.2)
Total Bilirubin: 0.6 mg/dL (ref 0.3–1.2)
Total Protein: 6.2 g/dL — ABNORMAL LOW (ref 6.5–8.1)

## 2021-09-20 LAB — CBC
HCT: 25.1 % — ABNORMAL LOW (ref 36.0–46.0)
Hemoglobin: 7.6 g/dL — ABNORMAL LOW (ref 12.0–15.0)
MCH: 31 pg (ref 26.0–34.0)
MCHC: 30.3 g/dL (ref 30.0–36.0)
MCV: 102.4 fL — ABNORMAL HIGH (ref 80.0–100.0)
Platelets: 293 10*3/uL (ref 150–400)
RBC: 2.45 MIL/uL — ABNORMAL LOW (ref 3.87–5.11)
RDW: 20.8 % — ABNORMAL HIGH (ref 11.5–15.5)
WBC: 11.9 10*3/uL — ABNORMAL HIGH (ref 4.0–10.5)
nRBC: 11.2 % — ABNORMAL HIGH (ref 0.0–0.2)

## 2021-09-20 LAB — HEPATITIS B CORE ANTIBODY, TOTAL: Hep B Core Total Ab: NONREACTIVE

## 2021-09-20 LAB — RENAL FUNCTION PANEL
Albumin: 2.7 g/dL — ABNORMAL LOW (ref 3.5–5.0)
Anion gap: 10 (ref 5–15)
BUN: 40 mg/dL — ABNORMAL HIGH (ref 6–20)
CO2: 27 mmol/L (ref 22–32)
Calcium: 8.8 mg/dL — ABNORMAL LOW (ref 8.9–10.3)
Chloride: 94 mmol/L — ABNORMAL LOW (ref 98–111)
Creatinine, Ser: 4.35 mg/dL — ABNORMAL HIGH (ref 0.44–1.00)
GFR, Estimated: 11 mL/min — ABNORMAL LOW (ref >60)
Glucose, Bld: 123 mg/dL — ABNORMAL HIGH (ref 70–99)
Phosphorus: 2.8 mg/dL (ref 2.5–4.6)
Potassium: 4.9 mmol/L (ref 3.5–5.1)
Sodium: 131 mmol/L — ABNORMAL LOW (ref 135–145)

## 2021-09-20 LAB — HEPATITIS B SURFACE ANTIBODY, QUANTITATIVE: Hep B S AB Quant (Post): <3.1 m[IU]/mL — ABNORMAL LOW (ref >9.9)

## 2021-09-20 LAB — HEPATITIS C ANTIBODY: HCV Ab: NONREACTIVE

## 2021-09-20 LAB — LACTATE DEHYDROGENASE: LDH: 315 U/L — ABNORMAL HIGH (ref 98–192)

## 2021-09-20 MED ORDER — POLYETHYLENE GLYCOL 3350 17 G PO PACK: 17.0000 g | PACK | Freq: Two times a day (BID) | ORAL | Status: DC | PRN

## 2021-09-20 MED ORDER — LINACLOTIDE 145 MCG PO CAPS
145.0000 ug | ORAL_CAPSULE | Freq: Every day | ORAL | Status: DC
  Administered 2021-09-21: 08:00:00 145 ug via ORAL
  Filled 2021-09-20: qty 1

## 2021-09-20 NOTE — Progress Notes (Signed)
Mobility Specialist Progress Note:   09/20/21 1500  Mobility  Activity Ambulated in hall  Level of Assistance Moderate assist, patient does 50-74%  Assistive Device None  Distance Ambulated (ft) 100 ft  Mobility Ambulated with assistance in hallway  Mobility Response Tolerated poorly  Mobility performed by Mobility specialist  $Mobility charge 1 Mobility   Session performed on 2LO2, with sats staying at or above 90%. Pt not requiring physical assist to stand, however unsteady when standing. Pt had multiple LOBs requiring modA to correct. Pt with cognitive deficits today, limiting ability to focus on a task. Pt back in bed with bed alarm on.   Nelta Numbers Mobility Specialist  Phone [phone]

## 2021-09-20 NOTE — Progress Notes (Signed)
Progress Note   Subjective  Chief Complaint: GI bleed  Patient seen receiving dialysis, also getting 1 packed red blood cell unit. Patient states she was having some abdominal discomfort yesterday, denies any abdominal pain today. Patient states she is very hungry, currently on diet dysphagia 3. Denies any nausea, vomiting. States she has not had a bowel movement this admission.     Objective   Vital signs in last 24 hours: Temp:  [97.5 F (36.4 C)-98.2 F (36.8 C)] 98.2 F (36.8 C) (12/21 1115) Pulse Rate:  [60-75] 69 (12/21 1130) Resp:  [12-23] 20 (12/21 1115) BP: (125-154)/(65-82) 146/72 (12/21 1130) SpO2:  [92 %-100 %] 93 % (12/21 1115) Weight:  [41 kg] 41 kg (12/21 0814) Last BM Date: 09/17/21  General:  thin appearing female in no acute distress  Heart:  regular rate and rhythm, no murmurs or gallops Pulm: Clear anteriorly; no wheezing Abdomen:  Soft, Flat AB, Normal bowel sounds. mild tenderness in the entire abdomen. Without guarding and Without rebound, without hepatomegaly. Extremities:  Without edema. Neurologic:  Alert and  oriented x4;  grossly normal neurologically.  Skin:   Dry and intact without significant lesions or rashes. Psychiatric: Cooperative. Normal mood and affect.  Intake/Output from previous day: 12/20 0701 - 12/21 0700 In: 420 [P.O.:420] Out: -  Intake/Output this shift: No intake/output data recorded.  Lab Results: Recent Labs    09/18/21 1317 09/18/21 2226 09/19/21 0324 09/20/21 0314  WBC 10.5  --  7.2 11.9*  HGB 8.2* 8.0* 7.5* 7.6*  HCT 26.2* 24.5* 23.9* 25.1*  PLT 392  --  273 293   BMET Recent Labs    09/18/21 1317 09/19/21 0324 09/20/21 0314  NA 134* 136 131*  K 4.7 4.1 4.9  CL 98 100 94*  CO2 '24 25 27  ' GLUCOSE 125* 121* 123*  BUN 40* 16 40*  CREATININE 4.73* 2.60* 4.35*  CALCIUM 8.7* 8.6* 8.8*   LFT Recent Labs    09/20/21 0314  PROT 6.2*  ALBUMIN 2.7*   2.7*  AST 57*  ALT 40  ALKPHOS 159*  BILITOT  0.6  BILIDIR <0.1  IBILI NOT CALCULATED   PT/INR No results for input(s): LABPROT, INR in the last 72 hours.  Studies/Results: CT ABDOMEN PELVIS WO CONTRAST  Result Date: 09/18/2021 CLINICAL DATA:  Abdominal pain, acute, nonlocalized. Shortness of breath for 3-4 weeks with hypoxia. Missed dialysis EXAM: CT ABDOMEN AND PELVIS WITHOUT CONTRAST TECHNIQUE: Multidetector CT imaging of the abdomen and pelvis was performed following the standard protocol without IV contrast. COMPARISON:  08/16/2021 FINDINGS: Lower chest: Small bilateral pleural effusions with associated atelectasis or consolidation within the lingula and bilateral lower lobes. Mild cardiomegaly. Extensive coronary artery calcification. Trace pericardial effusion. Relative hypoattenuation of the cardiac blood pool indicative of anemia. Hepatobiliary: Unremarkable unenhanced appearance of the liver. No focal liver lesion identified. Gallbladder within normal limits. No hyperdense gallstone. No biliary dilatation. Pancreas: Grossly unremarkable, although evaluation is limited in the absence of intravenous contrast and relative paucity of intra-fat. Spleen: Normal in size without focal abnormality. Adrenals/Urinary Tract: Unremarkable adrenal glands. Advanced left renal atrophy. No renal stone or hydronephrosis. Urinary bladder unremarkable for the degree of distension. Stomach/Bowel: Evaluation limited in the absence of intravenous or enteric contrast. Stomach is within normal limits. No evidence of bowel wall thickening, distention, or inflammatory changes. Vascular/Lymphatic: Severe, age advanced atherosclerotic calcifications throughout the aortoiliac axis. Bilateral iliac stents. No abdominopelvic lymphadenopathy is evident. Reproductive: Status post hysterectomy. No adnexal masses. Other:  No free fluid. No abdominopelvic fluid collection. No pneumoperitoneum. No abdominal wall hernia. Musculoskeletal: Diffuse anasarca.  No acute osseous  abnormality. IMPRESSION: 1. Small bilateral pleural effusions with associated atelectasis or consolidation within the lingula and bilateral lower lobes. 2. No acute findings identified within the abdomen or pelvis. 3. Diffuse anasarca. 4. Severe, age advanced atherosclerotic calcifications throughout the aortoiliac axis (ICD10-I70.0). Electronically Signed   By: Davina Poke D.O.   On: 09/18/2021 14:16   DG Chest 2 View  Result Date: 09/18/2021 CLINICAL DATA:  Shortness of breath EXAM: CHEST - 2 VIEW COMPARISON:  Chest radiograph dated August 18, 2021. FINDINGS: The heart is enlarged. Small bilateral pleural effusions. Bibasilar opacities which may represent pulmonary edema or infiltrate. Right IJ access double-lumen catheter is noted. The osseous structures are unremarkable. IMPRESSION: Stable cardiomegaly with small bilateral pleural effusions. Bibasilar opacities which may represent atelectasis or infiltrate. Electronically Signed   By: Keane Police D.O.   On: 09/18/2021 14:00       Impression /Plan:    GI bleed in patient with longstanding history of chronic macrocytic anemia history of mesenteric ischemia, diverticular bleed, small bowel AVMs readmitted to the hospital 09/18/2021 with SOB, CP, chronic anemia  -Small bowel capsule endoscopy and small bowel enteroscopy completed during her hospital admission 08/2021 without evidence of active upper or lower GI bleeding.   HGB 7.6 (7.5 on 12/20, 8.0 12/19, baseline 9) MCV 102.4  -Stable Hemoglobin, no bowel movement since admission likely indicating no active bleeding at this time. -1 PRBC transfusing during dialysis -Check H&H posttransfusion -Monitor H&H closely -Transfuse for hemoglobin less than 8 -If patient continues to have transfusion dependent anemia or overt bleeding can consider enteroscopy and colonoscopy.  COPD exacerbation on Azithromycin and Prednisone. Oxygen [place] as needed.    History of a hyperplastic colon polyp per  colonoscopy 03/2021.  Small bowel capsule endoscopy 08/21/2021 showed a colonic erosion versus a polyp.    ESRD on HD.  Getting dialysis today   Elevated LFTs. CTAP without contrast 09/18/2021 showed a normal liver without evidence of biliary ductal dilatation.  Alk phos 159 (170).  AST 57 (96).  ALT 40(53).  Total bili 0.7.  Hepatitis B surface antigen nonreactive. HIV negative.  Pending Hep C antibody, Hep B core IgM, Hep A IgM, ANA, ceruloplasmin, SMA, AMA and IgG in am   CAD, MI 2015   Significant peripheral arterial disease with chronic mesenteric ischemia s/p SMA stent, terminal aortic stent and bilateral common iliac artery stent 12/28/2020. No longer on Plavix or ASA secondary to chronic GI blood loss.     LOS: 2 days   [name]  09/20/2021, 11:34 AM

## 2021-09-20 NOTE — Progress Notes (Addendum)
Contacted [facility] this am to be informed that pt is supposed to transfer back to [facility]. Contacted [name] and informed pt will resume at d/c from the hospital. Pt will be MWF schedule. Pt will need to arrive at 11:30 for 11:50 chair time. Will meet with pt to make sure pt is aware of this info. Will assist as needed.  Melven Sartorius Renal Navigator [phone]  Addendum at 4:32 pm: Spoke to pt via phone. Pt aware that she will need to resume at [name] at d/c. Pt advised to arrive at 11:30 for 11:50 chair time.

## 2021-09-20 NOTE — TOC Initial Note (Signed)
Transition of Care [facility]) - Initial/Assessment Note    Patient Details  Name: [name] MRN: [id number] Date of Birth: [birth year]  Transition of Care [facility]) CM/SW Contact:    Marilu Favre, RN Phone Number: 09/20/2021, 1:35 PM  Clinical Narrative:                 Confirmed face sheet information with patient. Patient from home with family.   Patient has PCP  Patient has home oxygen with [employer] and has a portable tank at home.   No further TOC needs identified. Please consult TOC if needs arise.   Expected Discharge Plan: Home/Self Care Barriers to Discharge: Continued Medical Work up   Patient Goals and CMS Choice Patient states their goals for this hospitalization and ongoing recovery are:: to return tohome CMS Medicare.gov Compare Post Acute Care list provided to:: Patient    Expected Discharge Plan and Services Expected Discharge Plan: Home/Self Care   Discharge Planning Services: CM Consult   Living arrangements for the past 2 months: Single Family Home                 DME Arranged: N/A         HH Arranged: NA          Prior Living Arrangements/Services Living arrangements for the past 2 months: Single Family Home Lives with:: Relatives Patient language and need for interpreter reviewed:: Yes Do you feel safe going back to the place where you live?: Yes      Need for Family Participation in Patient Care: Yes (Comment) Care giver support system in place?: Yes (comment) Current home services: DME Criminal Activity/Legal Involvement Pertinent to Current Situation/Hospitalization: No - Comment as needed  Activities of Daily Living      Permission Sought/Granted   Permission granted to share information with : No              Emotional Assessment Appearance:: Appears stated age Attitude/Demeanor/Rapport: Engaged Affect (typically observed): Accepting Orientation: : Oriented to Place, Oriented to  Time, Oriented to Situation,  Oriented to Self Alcohol / Substance Use: Not Applicable Psych Involvement: No (comment)  Admission diagnosis:  COPD (chronic obstructive pulmonary disease) (HCC) [J44.9] Patient Active Problem List   Diagnosis Date Noted   COPD (chronic obstructive pulmonary disease) ([place]) 09/18/2021   Acute on chronic anemia    Nausea without vomiting    Acute hypoxemic respiratory failure ([place]) 08/16/2021   Dark stools    Diverticulosis    Adverse reaction to antiplatelet agent, sequela    AKI (acute kidney injury) ([place]) 05/11/2021   Chest pain, rule out acute myocardial infarction 05/09/2021   Abnormal EKG    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[place]) 12/30/2020   Chronic mesenteric ischemia ([place]) 12/28/2020   Polyp of ascending colon    AVM (arteriovenous malformation) of small bowel, acquired with hemorrhage    GIB (gastrointestinal bleeding) 08/23/2020   Hematochezia    Diverticulosis of colon without hemorrhage    Pain of upper abdomen    Angiodysplasia of colon 08/16/2020   Acute GI bleeding 08/05/2020   Acute blood loss anemia 08/05/2020   Renal artery stenosis ([place]) 05/10/2020   Abdominal aortic stenosis 11/30/2018   Pure hypercholesterolemia 10/22/2018   Abnormal stress test 03/21/2018   At high risk for injury related to fall 12/26/2017   Renal artery stenosis, native, bilateral ([place]) 10/27/2017   Benign neoplasm of transverse colon    Benign neoplasm of descending colon    Benign  neoplasm of sigmoid colon    Abnormality on screening test 06/22/2016   Hypertensive urgency 11/01/2015   Chest pain 11/01/2015   History of partial colectomy 10/19/2014   Renovascular hypertension, malignant 09/07/2014   Anemia, chronic disease 07/30/2014   Community acquired pneumonia 07/02/2014   Heart failure with preserved ejection fraction ([place]) 07/02/2014   COPD GOLD 0 / still smoking 07/02/2014   CKD (chronic kidney disease) stage 4, GFR 15-29 ml/min ([place]) 07/02/2014   Protein-calorie malnutrition,  severe ([place]) 07/02/2014   Hyperparathyroidism, secondary renal ([place]) 04/30/2014   Postsurgical menopause 08/14/2013   Vaginal atrophy 08/14/2013   Screening for breast cancer 07/21/2012   Diverticulitis of colon with perforation 06/18/2012   Affective bipolar disorder ([place]) 02/02/2009   ADHESIONS, INTESTINAL W/OBSTRUCTION 08/10/2008   Irritable bowel syndrome 08/10/2008   Abdominal pain, generalized 08/10/2008   FECAL OCCULT BLOOD 08/10/2008   Anemia, iron deficiency 08/10/2008   Anxiety state 08/09/2008   DEPRESSION 08/09/2008   ASTHMA 08/09/2008   ESOPHAGEAL STRICTURE 08/09/2008   GERD 08/09/2008   HIATAL HERNIA 08/09/2008   Diverticulosis of large intestine 08/09/2008   ARTHRITIS 08/09/2008   HEADACHE, CHRONIC 08/09/2008   SKIN CANCER, HX OF 08/09/2008   COLONIC POLYPS, HYPERPLASTIC, HX OF 08/09/2008   Cigarette smoker 05/17/2008   Schizophrenia ([place]) 04/30/2008   PCP:  Bernerd Limbo, MD Pharmacy:   [facility], [place], [address], [place] [address] Phone: [phone] Fax: [phone]  [place], [place] [address] [place] [address] Phone: [phone] Fax: [phone]  [facility] [address] [place] [address] Phone: [phone] Fax: [phone]     Social Determinants of Health (SDOH) Interventions    Readmission Risk Interventions No flowsheet data found.

## 2021-09-20 NOTE — Progress Notes (Signed)
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  She was a started on 4 L by nasal cannula.  CXR with cardiomegaly and mild pulmonary congestion.  CT chest/abdomen/pelvis showed mild bilateral pulmonary congestion with small bilateral effusion.  Nephrology and GI consulted.   Subjective: Seen and examined earlier this afternoon while on HD.  No major events overnight of this morning.  She complains dyspnea on exertion.  She says she feels hungry.  She also reports diffuse abdominal pain.  She says she has not a bowel movement in 6 days.  She denies chest pain or dizziness.  Objective: Vitals:   09/20/21 1115 09/20/21 1130 09/20/21 1145 09/20/21 1222  BP: (!) 154/67 (!) 146/72 (!) 145/71 (!) 158/79  Pulse: 75 69 75 71  Resp: 20   (!) 22  Temp: 98.2 F (36.8 C)   97.8 F (36.6 C)  TempSrc: Oral   Oral  SpO2: 93%   93%  Weight:    39 kg  Height:        Examination:  GENERAL: Frail looking elderly female.  No apparent distress. HEENT: MMM.  Vision and hearing grossly intact.  NECK: Supple.  No apparent JVD.  RESP: 93% on RA.  No IWOB.  Fair aeration bilaterally. CVS:  RRR. Heart sounds normal.  ABD/GI/GU: BS+. Abd soft.  Mild diffuse tenderness.  No rebound or guarding. MSK/EXT:  Moves extremities. No apparent deformity. No edema.  SKIN: no apparent skin lesion or wound NEURO: Awake, alert and oriented appropriately.  No apparent focal neuro  deficit. PSYCH: Calm. Normal affect.   Procedures:  None  Microbiology summarized: [id number] and influenza PCR nonreactive. GIP sent but patient has not BM.  Assessment & Plan: Acute respiratory failure with hypoxia-multifactorial including fluid overload from ESRD/diastolic CHF, COPD and anemia.  Resolved. -Treat treatable causes as below -Encourage incentive telemetry, OOB/PT/OT -Minimal oxygen to keep saturation above 88%.  Volume overload/acute on chronic diastolic CHF/ESRD-CXR and CT chest consistent with pulmonary vascular congestion and small bilateral pleural effusions. -HD per nephrology with ultrafiltration -Sodium and fluid restriction  COPD exacerbation: Improved.  Received IV Solu-Medrol in ED. -Continue prednisone, Zithromax, Dulera and DuoNeb -Supportive care as above.  Acute on chronic blood loss anemia due to GI bleed/ESRD: No significant response after 1 unit on 12/20. No bowel movement for 6 days making profuse bleeding less likely. Had extensive GI work-up in 11/22 with EGD and capsule.  Anemia panel consistent with anemia of chronic disease. Recent Labs    08/18/21 0540 08/18/21 1600 08/19/21 0307 08/20/21 0414 08/21/21 0259 09/14/21 0811 09/18/21 1317 09/18/21 2226 09/19/21 0324 09/20/21 0314  HGB 7.1* 9.3* 8.5* 8.9* 9.0* 10.9* 8.2* 8.0* 7.5* 7.6*  -GI following -Receiving additional 1 unit this morning -Monitor H&H   Abdominal pain/chronic Diarrhea/history of IBS: Carries history of mesenteric ischemia but denies postprandial changes.  Patient denies BM for 6 days now. -Discontinue enteric precaution -Bowel regimen -Continue soft diet  Acute  transaminitis: Pattern suggested EtOH and/or rhabdo.  CT abdomen and pelvis: No acute finding or hepatic biliary system.  Resolving. Recent Labs  Lab 09/18/21 1317 09/19/21 0324 09/20/21 0314  AST 111* 96* 57*  ALT 50* 53* 40  ALKPHOS 189* 170* 159*  BILITOT 0.9 0.7 0.6  PROT 6.2* 5.7* 6.2*  ALBUMIN  2.6* 2.4* 2.7*   2.7*  -Continue monitoring  History of PVD and mesenteric ischemia s/p SMA stenting, terminal aortic stenting, bilateral common iliac artery stenting on 12/28/2020.  Taken off Plavix in 05/2021, and low-dose aspirin in 06/2021 due to anemia. -Outpatient follow-up with vascular and cardiology  Constipation -Start MiraLAX -Continue home [place].   Chronic pain/anxiety: Patient is on Percocet 02/01/2024 twice daily and Ativan 1 mg twice daily which could increase risk of constipation. -Continue home Effexor, Percocet and Xanax   Severe protein caloric malnutrition: Body mass index is 16.25 kg/m. Nutrition Problem: Severe Malnutrition Etiology: chronic illness (COPD, CHF, ESRD) Signs/Symptoms: severe muscle depletion, severe fat depletion Interventions: Ensure Enlive (each supplement provides 350kcal and 20 grams of protein), MVI, Refer to RD note for recommendations   DVT prophylaxis:  SCDs Start: 09/18/21 1516  Code Status: Full code Family Communication: Patient and/or RN. Available if any question.  Level of care: Med-Surg Status is: Inpatient  Remains inpatient appropriate because: Acute on chronic blood loss anemia requiring blood transfusion   Final disposition: Likely home once H&H stable and cleared by consultants    Consultants:  Gastroenterology Nephrology   Sch Meds:  Scheduled Meds:  sodium chloride   Intravenous Once   amLODipine  10 mg Oral Daily   azithromycin  500 mg Oral Daily   calcium acetate  2,001 mg Oral TID WC   calcium acetate  667 mg Oral With snacks   calcium-vitamin D  1 tablet Oral Q breakfast   Chlorhexidine Gluconate Cloth  6 each Topical Q0600   feeding supplement  237 mL Oral BID BM   ipratropium-albuterol  3 mL Nebulization Q6H WA   isosorbide mononitrate  30 mg Oral Daily   labetalol  100 mg Oral BID   linaclotide  72 mcg Oral QAC breakfast   loratadine  10 mg Oral Daily   megestrol  40 mg Oral Daily    mometasone-formoterol  2 puff Inhalation Q12H   multivitamin  1 tablet Oral QHS   pantoprazole  40 mg Oral Daily   predniSONE  40 mg Oral Q breakfast   promethazine  25 mg Oral Daily   venlafaxine XR  300 mg Oral Q breakfast   vitamin B-12  500 mcg Oral Daily   Continuous Infusions: PRN Meds:.albuterol, ALPRAZolam, azelastine, ondansetron, oxyCODONE-acetaminophen, polyethylene glycol, traZODone  Antimicrobials: Anti-infectives (From admission, onward)    Start     Dose/Rate Route Frequency Ordered Stop   09/19/21 1645  azithromycin (ZITHROMAX) tablet 500 mg  Status:  Discontinued       See Hyperspace for full Linked Orders Report.   500 mg Oral Daily 09/18/21 1517 09/19/21 0840   09/19/21 1000  azithromycin (ZITHROMAX) tablet 500 mg        500 mg Oral Daily 09/19/21 0841 09/24/21 0959   09/18/21 1644  azithromycin (ZITHROMAX) 500 mg in sodium chloride 0.9 % 250 mL IVPB  Status:  Discontinued       See Hyperspace for full Linked Orders Report.   500 mg 250 mL/hr over 60 Minutes Intravenous Every 24 hours 09/18/21 1517 09/19/21 0840  I have personally reviewed the following labs and images: CBC: Recent Labs  Lab 09/14/21 0811 09/18/21 1317 09/18/21 2226 09/19/21 0324 09/20/21 0314  WBC  --  10.5  --  7.2 11.9*  NEUTROABS  --  8.4*  --   --   --   HGB 10.9* 8.2* 8.0* 7.5* 7.6*  HCT 32.0* 26.2* 24.5* 23.9* 25.1*  MCV  --  98.5  --  98.8 102.4*  PLT  --  392  --  273 293   BMP &GFR Recent Labs  Lab 09/14/21 0811 09/18/21 1317 09/19/21 0324 09/20/21 0314  NA 136 134* 136 131*  K 5.1 4.7 4.1 4.9  CL 102 98 100 94*  CO2  --  24 25 27   GLUCOSE 92 125* 121* 123*  BUN 30* 40* 16 40*  CREATININE 3.20* 4.73* 2.60* 4.35*  CALCIUM  --  8.7* 8.6* 8.8*  PHOS  --   --  3.7 2.8   Estimated Creatinine Clearance: 8.7 mL/min (A) (by C-G formula based on SCr of 4.35 mg/dL (H)). Liver & Pancreas: Recent Labs  Lab 09/18/21 1317 09/19/21 0324 09/20/21 0314  AST 111*  96* 57*  ALT 50* 53* 40  ALKPHOS 189* 170* 159*  BILITOT 0.9 0.7 0.6  PROT 6.2* 5.7* 6.2*  ALBUMIN 2.6* 2.4* 2.7*   2.7*   Recent Labs  Lab 09/18/21 1317  LIPASE 34   No results for input(s): AMMONIA in the last 168 hours. Diabetic: No results for input(s): HGBA1C in the last 72 hours. No results for input(s): GLUCAP in the last 168 hours. Cardiac Enzymes: No results for input(s): CKTOTAL, CKMB, CKMBINDEX, TROPONINI in the last 168 hours. No results for input(s): PROBNP in the last 8760 hours. Coagulation Profile: No results for input(s): INR, PROTIME in the last 168 hours. Thyroid Function Tests: No results for input(s): TSH, T4TOTAL, FREET4, T3FREE, THYROIDAB in the last 72 hours. Lipid Profile: No results for input(s): CHOL, HDL, LDLCALC, TRIG, CHOLHDL, LDLDIRECT in the last 72 hours. Anemia Panel: Recent Labs    09/18/21 1516 09/18/21 2226  FERRITIN 1,304*  --   TIBC 270  --   IRON 60  --   RETICCTPCT  --  5.8*   Urine analysis:    Component Value Date/Time   COLORURINE YELLOW 08/20/2021 0140   APPEARANCEUR CLEAR 08/20/2021 0140   APPEARANCEUR Cloudy (A) 03/22/2021 1432   LABSPEC 1.012 08/20/2021 0140   LABSPEC 1.016 06/07/2012 1350   PHURINE 5.0 08/20/2021 0140   GLUCOSEU 50 (A) 08/20/2021 0140   GLUCOSEU Negative 06/07/2012 1350   HGBUR SMALL (A) 08/20/2021 0140   BILIRUBINUR NEGATIVE 08/20/2021 0140   BILIRUBINUR Negative 03/22/2021 1432   BILIRUBINUR Negative 06/07/2012 1350   KETONESUR NEGATIVE 08/20/2021 0140   PROTEINUR 100 (A) 08/20/2021 0140   UROBILINOGEN 0.2 06/28/2014 1518   NITRITE NEGATIVE 08/20/2021 0140   LEUKOCYTESUR NEGATIVE 08/20/2021 0140   LEUKOCYTESUR Negative 06/07/2012 1350   Sepsis Labs: Invalid input(s): PROCALCITONIN, [place]  Microbiology: Recent Results (from the past 240 hour(s))  Resp Panel by RT-PCR (Flu A&B, Covid) Nasopharyngeal Swab     Status: None   Collection Time: 09/18/21  1:20 PM   Specimen:  Nasopharyngeal Swab; Nasopharyngeal(NP) swabs in vial transport medium  Result Value Ref Range Status   SARS Coronavirus 2 by RT PCR NEGATIVE NEGATIVE Final    Comment: (NOTE) SARS-CoV-2 target nucleic acids are NOT DETECTED.  The SARS-CoV-2 RNA is generally detectable in upper respiratory specimens during the acute phase of  infection. The lowest concentration of SARS-CoV-2 viral copies this assay can detect is 138 copies/mL. A negative result does not preclude SARS-Cov-2 infection and should not be used as the sole basis for treatment or other patient management decisions. A negative result may occur with  improper specimen collection/handling, submission of specimen other than nasopharyngeal swab, presence of viral mutation(s) within the areas targeted by this assay, and inadequate number of viral copies(<138 copies/mL). A negative result must be combined with clinical observations, patient history, and epidemiological information. The expected result is Negative.  Fact Sheet for Patients:  EntrepreneurPulse.com.au  Fact Sheet for Healthcare Providers:  IncredibleEmployment.be  This test is no t yet approved or cleared by the [place] FDA and  has been authorized for detection and/or diagnosis of SARS-CoV-2 by FDA under an Emergency Use Authorization (EUA). This EUA will remain  in effect (meaning this test can be used) for the duration of the COVID-19 declaration under Section 564(b)(1) of the Act, 21 U.S.C.section 360bbb-3(b)(1), unless the authorization is terminated  or revoked sooner.       Influenza A by PCR NEGATIVE NEGATIVE Final   Influenza B by PCR NEGATIVE NEGATIVE Final    Comment: (NOTE) The Xpert Xpress SARS-CoV-2/FLU/RSV plus assay is intended as an aid in the diagnosis of influenza from Nasopharyngeal swab specimens and should not be used as a sole basis for treatment. Nasal washings and aspirates are unacceptable for  Xpert Xpress SARS-CoV-2/FLU/RSV testing.  Fact Sheet for Patients: EntrepreneurPulse.com.au  Fact Sheet for Healthcare Providers: IncredibleEmployment.be  This test is not yet approved or cleared by the [place] FDA and has been authorized for detection and/or diagnosis of SARS-CoV-2 by FDA under an Emergency Use Authorization (EUA). This EUA will remain in effect (meaning this test can be used) for the duration of the COVID-19 declaration under Section 564(b)(1) of the Act, 21 U.S.C. section 360bbb-3(b)(1), unless the authorization is terminated or revoked.  Performed at [facility], [place] [address], [place], [place] [address]     Radiology Studies: No results found.    Glenyce Randle T. [place]  If 7PM-7AM, please contact night-coverage [email or web address] 09/20/2021, 2:32 PM

## 2021-09-20 NOTE — Procedures (Signed)
I was present at this dialysis session. I have reviewed the session itself and made appropriate changes.   Vital signs in last 24 hours:  Temp:  [97.5 F (36.4 C)-97.8 F (36.6 C)] 97.8 F (36.6 C) (12/21 0814) Pulse Rate:  [60-68] 63 (12/21 0830) Resp:  [12-20] 12 (12/21 0814) BP: (125-144)/(65-78) 125/67 (12/21 0830) SpO2:  [94 %-100 %] 95 % (12/21 0814) Weight:  [41 kg] 41 kg (12/21 0814) Weight change:  Filed Weights   09/18/21 1316 09/20/21 0814  Weight: 42.2 kg 41 kg    Recent Labs  Lab 09/20/21 0314  NA 131*  K 4.9  CL 94*  CO2 27  GLUCOSE 123*  BUN 40*  CREATININE 4.35*  CALCIUM 8.8*  PHOS 2.8    Recent Labs  Lab 09/18/21 1317 09/18/21 2226 09/19/21 0324 09/20/21 0314  WBC 10.5  --  7.2 11.9*  NEUTROABS 8.4*  --   --   --   HGB 8.2* 8.0* 7.5* 7.6*  HCT 26.2* 24.5* 23.9* 25.1*  MCV 98.5  --  98.8 102.4*  PLT 392  --  273 293    Scheduled Meds:  sodium chloride   Intravenous Once   amLODipine  10 mg Oral Daily   azithromycin  500 mg Oral Daily   calcium acetate  2,001 mg Oral TID WC   calcium acetate  667 mg Oral With snacks   calcium-vitamin D  1 tablet Oral Q breakfast   Chlorhexidine Gluconate Cloth  6 each Topical Q0600   diphenhydrAMINE  25 mg Intravenous Once   feeding supplement  237 mL Oral BID BM   ipratropium-albuterol  3 mL Nebulization Q6H WA   isosorbide mononitrate  30 mg Oral Daily   labetalol  100 mg Oral BID   linaclotide  72 mcg Oral QAC breakfast   loratadine  10 mg Oral Daily   megestrol  40 mg Oral Daily   mometasone-formoterol  2 puff Inhalation Q12H   multivitamin  1 tablet Oral QHS   pantoprazole  40 mg Oral Daily   predniSONE  40 mg Oral Q breakfast   promethazine  25 mg Oral Daily   venlafaxine XR  300 mg Oral Q breakfast   vitamin B-12  500 mcg Oral Daily   Continuous Infusions: PRN Meds:.albuterol, ALPRAZolam, azelastine, ondansetron, oxyCODONE-acetaminophen, traZODone   Donetta Potts,  MD 09/20/2021,  8:33 AM

## 2021-09-20 NOTE — Progress Notes (Signed)
Pt returned back to [address] from dialysis. Alert. bed in lowest position. Call light in reach

## 2021-09-21 ENCOUNTER — Other Ambulatory Visit (HOSPITAL_COMMUNITY): Payer: Self-pay

## 2021-09-21 ENCOUNTER — Encounter (HOSPITAL_COMMUNITY): Payer: Self-pay

## 2021-09-21 DIAGNOSIS — K5900 Constipation, unspecified: Secondary | ICD-10-CM

## 2021-09-21 DIAGNOSIS — R1084 Generalized abdominal pain: Secondary | ICD-10-CM

## 2021-09-21 DIAGNOSIS — Z862 Personal history of diseases of the blood and blood-forming organs and certain disorders involving the immune mechanism: Secondary | ICD-10-CM

## 2021-09-21 DIAGNOSIS — R109 Unspecified abdominal pain: Secondary | ICD-10-CM

## 2021-09-21 DIAGNOSIS — R195 Other fecal abnormalities: Secondary | ICD-10-CM

## 2021-09-21 DIAGNOSIS — Z9189 Other specified personal risk factors, not elsewhere classified: Secondary | ICD-10-CM

## 2021-09-21 DIAGNOSIS — R7401 Elevation of levels of liver transaminase levels: Secondary | ICD-10-CM

## 2021-09-21 DIAGNOSIS — E8770 Fluid overload, unspecified: Secondary | ICD-10-CM

## 2021-09-21 LAB — COMPREHENSIVE METABOLIC PANEL
ALT: 34 U/L (ref 0–44)
AST: 44 U/L — ABNORMAL HIGH (ref 15–41)
Albumin: 2.8 g/dL — ABNORMAL LOW (ref 3.5–5.0)
Alkaline Phosphatase: 159 U/L — ABNORMAL HIGH (ref 38–126)
Anion gap: 8 (ref 5–15)
BUN: 21 mg/dL — ABNORMAL HIGH (ref 6–20)
CO2: 29 mmol/L (ref 22–32)
Calcium: 8.6 mg/dL — ABNORMAL LOW (ref 8.9–10.3)
Chloride: 95 mmol/L — ABNORMAL LOW (ref 98–111)
Creatinine, Ser: 2.73 mg/dL — ABNORMAL HIGH (ref 0.44–1.00)
GFR, Estimated: 20 mL/min — ABNORMAL LOW (ref >60)
Glucose, Bld: 104 mg/dL — ABNORMAL HIGH (ref 70–99)
Potassium: 3.8 mmol/L (ref 3.5–5.1)
Sodium: 132 mmol/L — ABNORMAL LOW (ref 135–145)
Total Bilirubin: 0.8 mg/dL (ref 0.3–1.2)
Total Protein: 6.6 g/dL (ref 6.5–8.1)

## 2021-09-21 LAB — TYPE AND SCREEN
ABO/RH(D): O NEG
Antibody Screen: NEGATIVE
Unit division: 0

## 2021-09-21 LAB — HEMOGLOBIN AND HEMATOCRIT, BLOOD
HCT: 29.8 % — ABNORMAL LOW (ref 36.0–46.0)
Hemoglobin: 9.4 g/dL — ABNORMAL LOW (ref 12.0–15.0)

## 2021-09-21 LAB — HAPTOGLOBIN: Haptoglobin: 97 mg/dL (ref 33–346)

## 2021-09-21 LAB — CK: Total CK: 70 U/L (ref 38–234)

## 2021-09-21 LAB — BPAM RBC
Blood Product Expiration Date: 202301012359
ISSUE DATE / TIME: 202212211118
Unit Type and Rh: 9500

## 2021-09-21 LAB — ANTI-SMOOTH MUSCLE ANTIBODY, IGG: F-Actin IgG: 5 Units (ref 0–19)

## 2021-09-21 LAB — IGG: IgG (Immunoglobin G), Serum: 1137 mg/dL (ref 586–1602)

## 2021-09-21 LAB — MAGNESIUM: Magnesium: 2 mg/dL (ref 1.7–2.4)

## 2021-09-21 LAB — CERULOPLASMIN: Ceruloplasmin: 30.7 mg/dL (ref 19.0–39.0)

## 2021-09-21 LAB — MITOCHONDRIAL ANTIBODIES: Mitochondrial M2 Ab, IgG: <20 Units (ref 0.0–20.0)

## 2021-09-21 LAB — ANA: Anti Nuclear Antibody (ANA): NEGATIVE

## 2021-09-21 LAB — PHOSPHORUS: Phosphorus: 3.3 mg/dL (ref 2.5–4.6)

## 2021-09-21 MED ORDER — POLYETHYLENE GLYCOL 3350 17 GM/SCOOP PO POWD
17.0000 g | Freq: Two times a day (BID) | ORAL | 0 refills | Status: AC | PRN
  Filled 2021-09-21: qty 255, 8d supply, fill #0

## 2021-09-21 MED ORDER — PREDNISONE 20 MG PO TABS
40.0000 mg | ORAL_TABLET | Freq: Every day | ORAL | 0 refills | Status: AC
  Filled 2021-09-21: qty 4, 2d supply, fill #0

## 2021-09-21 MED ORDER — RENA-VITE PO TABS: 1.0000 | ORAL_TABLET | Freq: Every day | ORAL | 0 refills | Status: AC

## 2021-09-21 NOTE — Progress Notes (Signed)
Progress Note   Subjective  Chief Complaint: GI bleed  Patient sitting in bed eating lunch, states she is waiting on a ride for discharge. Patient did have enteric precautions with entering the room, has order for GI pathogen and C. difficile placed on admission.  Per patient had 2 bowel movements yesterday loose, specks of red.  Per nurse states has not been able to see any of the bowel movements and has been unable to collect specimen in this patient. Patient has continuing abdominal discomfort, unchanged from her normal abdominal pain. States she has unchanged nausea for the past 9 years worse in the morning, denies vomiting. Has baseline shortness of breath but states this is worse, denies cough, chest pain, fever or chills. [place] abdominal swelling.    Objective   Vital signs in last 24 hours: Temp:  [97.8 F (36.6 C)-98.5 F (36.9 C)] 97.8 F (36.6 C) (12/22 0815) Pulse Rate:  [66-76] 71 (12/22 0815) Resp:  [16-18] 18 (12/22 0815) BP: (134-154)/(73-80) 154/80 (12/22 0815) SpO2:  [89 %-97 %] 94 % (12/22 0815) Last BM Date: 09/20/21  General:  thin appearing female in no acute distress  Heart:  regular rate and rhythm, no murmurs or gallops Pulm: Clear anteriorly; no wheezing Abdomen:  Soft, distended AB, Normal bowel sounds. mild tenderness in the entire abdomen. Without guarding and Without rebound, without hepatomegaly. Extremities:  Without edema. Neurologic:  Alert and  oriented x4;  grossly normal neurologically.  Psychiatric: Cooperative. Normal mood and affect.  Intake/Output from previous day: 12/21 0701 - 12/22 0700 In: 555 [P.O.:240; Blood:315] Out: 2000  Intake/Output this shift: Total I/O In: 240 [P.O.:240] Out: -   Lab Results: Recent Labs    09/19/21 0324 09/20/21 0314 09/21/21 0431  WBC 7.2 11.9*  --   HGB 7.5* 7.6* 9.4*  HCT 23.9* 25.1* 29.8*  PLT 273 293  --    BMET Recent Labs    09/19/21 0324 09/20/21 0314 09/21/21 0431  NA 136  131* 132*  K 4.1 4.9 3.8  CL 100 94* 95*  CO2 _0 GLUCOSE 121* 123* 104*  BUN 16 40* 21*  CREATININE 2.60* 4.35* 2.73*  CALCIUM 8.6* 8.8* 8.6*   LFT Recent Labs    09/20/21 0314 09/21/21 0431  PROT 6.2* 6.6  ALBUMIN 2.7*   2.7* 2.8*  AST 57* 44*  ALT 40 34  ALKPHOS 159* 159*  BILITOT 0.6 0.8  BILIDIR <0.1  --   IBILI NOT CALCULATED  --    PT/INR No results for input(s): LABPROT, INR in the last 72 hours.  Studies/Results: No results found.     Impression /Plan:    GI bleed in patient with longstanding history of chronic macrocytic anemia history of mesenteric ischemia, diverticular bleed, small bowel AVMs readmitted to the hospital 09/18/2021 with SOB, CP, chronic anemia  -Small bowel capsule endoscopy and small bowel enteroscopy completed during her hospital admission 08/2021 without evidence of active upper or lower GI bleeding.   HGB 7.6--> 9.4 after 1 PRBC yesterday (7.5 on 12/20, 8.0 12/19, baseline 9) MCV 102.4  - 2 BM's yesterday, loose, small amount of blood per patient however nurse states was just urine in the hat, they have not seen any stools.  - No stools today.  - hemoglobin stable.  -If patient continues to have transfusion dependent anemia or overt bleeding can consider enteroscopy and colonoscopy outpatient.  Has follow up Dr. Ardis Hughs 11/22/2021.   Enteric precautions Unable to obtain  GI pathogen panel that was ordred by primary Does not appear to have any stools or enough stools to collect If any diarrhea or concern for cdiff/enteric infection- suggest Gi pathogen panel with PCP  COPD exacerbation- being discharged home   History of a hyperplastic colon polyp per colonoscopy 03/2021.  Small bowel capsule endoscopy 08/21/2021 showed a colonic erosion versus a polyp.    ESRD on HD.     Elevated LFTs. CTAP without contrast 09/18/2021 showed a normal liver without evidence of biliary ductal dilatation.  Alk phos 159 (170).  AST 57 (96).  ALT  40(53).  Total bili 0.7.   Hepatitis B surface antigen nonreactive. HIV negative. Ferritin elevated as acute phase reactant, iron and TIBC normal.  Hep C antibody negative,  ceruloplasmin negative, SMA negative, AMA negative and IgG normal ANA pending,   CAD, MI 2015   Significant peripheral arterial disease with chronic mesenteric ischemia s/p SMA stent, terminal aortic stent and bilateral common iliac artery stent 12/28/2020. No longer on Plavix or ASA secondary to chronic GI blood loss.     LOS: 3 days   [name]  09/21/2021, 1:38 PM

## 2021-09-21 NOTE — Progress Notes (Signed)
Discharge medications delivered and given to pt for discharge.

## 2021-09-21 NOTE — Care Management Important Message (Signed)
Important Message  Patient Details  Name: [name] MRN: [id number] Date of Birth: 1961/02/08   Medicare Important Message Given:  Yes     Hannah Beat 09/21/2021, 12:26 PM

## 2021-09-21 NOTE — Discharge Summary (Signed)
Physician Discharge Summary  [name] ZES:[id number] DOB: 05/21/1961 DOA: 09/18/2021  PCP: Bernerd Limbo, MD  Admit date: 09/18/2021 Discharge date: 09/21/2021 Admitted From: Home Disposition: Home Recommendations for Outpatient Follow-up:  Follow ups as below. Please obtain CBC/CMP/Mag at follow up Please follow up on the following pending results: None Home Health: Not indicated Equipment/Devices: Patient has home oxygen Discharge Condition: Stable CODE STATUS: Full code  Follow-up Information     [name], [facility] Follow up.   Why: Schedule is Monday, Wednesday, Friday. Patient needs to arrive at 11:30 for 11:50 chair time. Contact information: [place] [address] [phone]         Bernerd Limbo, MD. Schedule an appointment as soon as possible for a visit in 1 week(s).   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  She was a started on 4 L by nasal cannula.  CXR with cardiomegaly and mild pulmonary congestion.  CT chest/abdomen/pelvis showed mild bilateral pulmonary congestion with small bilateral effusion.  Nephrology and GI consulted.  Patient's respiratory status improved with hemodialysis, treatment for COPD exacerbation and blood transfusion.  She was weaned to room air but required 2 L by nasal cannula with ambulation/exertion, which is her home level.  She is cleared for discharge by nephrology and gastroenterology.  She is discharged on prednisone for 2 more days to complete 5 days  course.  Patient evaluated by therapy and no needles identified.   See individual problem list below for more on hospital course.  Discharge Diagnoses:  Acute respiratory failure with hypoxia-due to fluid overload from ESRD/diastolic CHF, COPD and anemia.  Resolved. -Treat treatable causes as below   Volume overload/acute on chronic diastolic CHF/ESRD-CXR and CT chest consistent with pulmonary vascular congestion and small bilateral pleural effusions.  Had HD with ultrafiltration.  Net -3 L. -Resume outpatient HD   COPD exacerbation: Improved.  Received IV Solu-Medrol in ED. -Received Zithromax for 3 days -P.o. prednisone 40 mg daily for 2 more days -Continue home inhalers and home oxygen.   Acute on chronic blood loss anemia due to GI bleed/ESRD: Transfused 2 units.  Had extensive GI work-up in 11/22 with EGD and capsule.  Anemia panel consistent with anemia of chronic disease but she recently received iron infusion. Recent Labs    08/18/21 1600 08/19/21 0307 08/20/21 0414 08/21/21 0259 09/14/21 0811 09/18/21 1317 09/18/21 2226 09/19/21 0324 09/20/21 0314 09/21/21 0431  HGB 9.3* 8.5* 8.9* 9.0* 10.9* 8.2* 8.0* 7.5* 7.6* 9.4*  -Recheck CBC at follow-up in 1 to 2 weeks. -Appreciate input by gastroenterology   Abdominal pain/chronic Diarrhea/history of IBS: Carries history of mesenteric ischemia but pain not necessarily postprandial.  Could be due to opiate induced constipation.  -Continue home Linzess   Acute transaminitis: Pattern suggested EtOH and/or rhabdo.  CT abdomen and pelvis without acute finding.  Resolving. Recent Labs  Lab 09/18/21 1317 09/19/21 0324 09/20/21 0314 09/21/21 0431  AST 111* 96* 57* 44*  ALT 50* 53* 40 34  ALKPHOS 189* 170* 159* 159*  BILITOT  0.9 0.7 0.6 0.8  PROT 6.2* 5.7* 6.2* 6.6  ALBUMIN 2.6* 2.4* 2.7*   2.7* 2.8*  -Recheck CMP in 1 to 2 weeks   History of PVD and mesenteric ischemia s/p SMA stenting, terminal aortic stenting, bilateral  common iliac artery stenting on 12/28/2020.  Taken off Plavix in 05/2021, and low-dose aspirin in 06/2021 due to anemia. -Outpatient follow-up with vascular and cardiology   Constipation -Continue home Linzess.  Added MiraLAX.   Chronic pain/anxiety: Stable. -Continue home meds  At risk for polypharmacy: Patient is on Percocet, Xanax, trazodone and promethazine.  Discussed risks of sedation, respiratory depression, fall and constipation -Encouraged to review the medication with prescribers   Severe protein caloric malnutrition: Body mass index is 16.25 kg/m. Nutrition Problem: Severe Malnutrition Etiology: chronic illness (COPD, CHF, ESRD) Signs/Symptoms: severe muscle depletion, severe fat depletion Interventions: Ensure Enlive (each supplement provides 350kcal and 20 grams of protein), MVI, Refer to RD note for recommendations     Discharge Exam: Vitals:   09/20/21 2001 09/20/21 2125 09/21/21 0443 09/21/21 0815  BP:  (!) 143/75 137/73 (!) 154/80  Pulse:  69 76 71  Temp:  97.9 F (36.6 C) 97.9 F (36.6 C) 97.8 F (36.6 C)  Resp:  17 17 18   Height:      Weight:      SpO2: 90% 97% 90% 94%  TempSrc:    Oral  BMI (Calculated):         GENERAL: Frail looking elderly female.  No apparent distress. HEENT: MMM.  Vision and hearing grossly intact.  NECK: Supple.  No apparent JVD.  RESP: 94% on 2 L.  No IWOB.  Fair aeration bilaterally. CVS:  RRR. Heart sounds normal.  ABD/GI/GU: Bowel sounds present. Soft. Non tender.  MSK/EXT:  Moves extremities. No apparent deformity. No edema.  SKIN: no apparent skin lesion or wound NEURO: Awake and alert.  Oriented appropriately.  No apparent focal neuro deficit. PSYCH: Calm. Normal affect.   Discharge Instructions  Discharge Instructions     (HEART FAILURE PATIENTS) Call MD:  Anytime you have any of the following symptoms: 1) 3 pound weight gain in 24 hours or 5 pounds in 1 week 2) shortness of breath, with or without a dry hacking  cough 3) swelling in the hands, feet or stomach 4) if you have to sleep on extra pillows at night in order to breathe.   Complete by: As directed    Call MD for:  difficulty breathing, headache or visual disturbances   Complete by: As directed    Call MD for:  extreme fatigue   Complete by: As directed    Call MD for:  persistant dizziness or light-headedness   Complete by: As directed    Call MD for:  severe uncontrolled pain   Complete by: As directed    Diet - low sodium heart healthy   Complete by: As directed    Discharge instructions   Complete by: As directed    It has been a pleasure taking care of you!  You were hospitalized with shortness of breath, cough and weakness likely due to fluid overload from heart failure and ESRD, COPD exacerbation and anemia.  You have been treated with hemodialysis, antibiotics, steroid and blood transfusion, and your symptoms improved to the point we think it is safe to let you go home and follow-up with your primary care doctor.  We noted that you are on Percocet, Xanax, promethazine and trazodone.  This combination would increase  your risk of sedation, fall and constipation.  Please review your medication with your prescribers.  Follow-up with your primary care doctor in 1 to 2 weeks or sooner if needed.   Take care,   Increase activity slowly   Complete by: As directed       Allergies as of 09/21/2021       Reactions   Doxycycline Anaphylaxis, Hives   Hydralazine Shortness Of Breath, Rash   Patient reports "couldn't breath"   Aspirin Other (See Comments)   Internal bleeding- MD SAID to not take this   Hydrocodone Nausea And Vomiting   Ibuprofen Other (See Comments)   Caused internal bleeding   Tylenol [acetaminophen] Other (See Comments)   MD told the patient to not take this   Iohexol Itching, Other (See Comments)   Pt has itching nose after iv contrast injection        Medication List     TAKE these medications     albuterol 108 (90 Base) MCG/ACT inhaler Commonly known as: VENTOLIN HFA Inhale 2 puffs into the lungs every 6 (six) hours as needed for wheezing or shortness of breath (asthma).   ALPRAZolam 1 MG tablet Commonly known as: XANAX Take 1 mg by mouth 2 (two) times daily as needed for anxiety.   amLODipine 10 MG tablet Commonly known as: NORVASC TAKE 1 TABLET BY MOUTH ONCE DAILY   atorvastatin 80 MG tablet Commonly known as: LIPITOR Take 80 mg by mouth at bedtime.   azelastine 0.1 % nasal spray Commonly known as: ASTELIN Place 1 spray into both nostrils daily as needed for allergies (seasonal allergies).   CALCIUM 600 + D PO Take 1 tablet by mouth daily.   calcium acetate 667 MG capsule Commonly known as: PHOSLO Take 2,001 mg by mouth See admin instructions. Take 2001mg  (3 capsules) by mouth three times a day with meals and 667mg  (1 capsule) with a snack   cetirizine 10 MG tablet Commonly known as: ZYRTEC Take 10 mg by mouth daily as needed for allergies.   Ensure Take 237 mLs by mouth 2 (two) times daily between meals.   ezetimibe 10 MG tablet Commonly known as: ZETIA TAKE 1 TABLET BY MOUTH ONCE A DAY   isosorbide mononitrate 30 MG 24 hr tablet Commonly known as: IMDUR Take 30 mg by mouth daily.   labetalol 100 MG tablet Commonly known as: NORMODYNE Take 100 mg by mouth 2 (two) times daily.   Linzess 72 MCG capsule Generic drug: linaclotide TAKE 1 CAPSULE BY MOUTH ONCE DAILY BEFORE BREAKFAST What changed: See the new instructions.   megestrol 40 MG tablet Commonly known as: MEGACE Take 40 mg by mouth daily.   mometasone-formoterol 100-5 MCG/ACT Aero Commonly known as: DULERA Take 2 puffs first thing in am and then another 2 puffs about 12 hours later. What changed:  how much to take how to take this when to take this additional instructions   multivitamin Tabs tablet Take 1 tablet by mouth at bedtime.   nitroGLYCERIN 0.4 MG SL tablet Commonly known  as: NITROSTAT Place 1 tablet (0.4 mg total) under the tongue every 5 (five) minutes as needed for chest pain.   ondansetron 4 MG disintegrating tablet Commonly known as: ZOFRAN-ODT Take 1 tablet (4 mg total) by mouth every 8 (eight) hours as needed for nausea or vomiting. What changed:  when to take this additional instructions   oxyCODONE-acetaminophen 5-325 MG tablet Commonly known as: PERCOCET/ROXICET Take 1 tablet by mouth 2 (two)  times daily as needed for pain.   pantoprazole 40 MG tablet Commonly known as: PROTONIX Take 1 tablet (40 mg total) by mouth daily.   polyethylene glycol powder 17 GM/SCOOP powder Commonly known as: MiraLax Take 17 g by mouth 2 (two) times daily as needed for moderate constipation or mild constipation.   predniSONE 20 MG tablet Commonly known as: DELTASONE Take 2 tablets (40 mg total) by mouth daily with breakfast for 2 days. Start taking on: September 22, 2021   promethazine 25 MG tablet Commonly known as: PHENERGAN Take 25 mg by mouth daily. Alternate with Ondansetron   traZODone 100 MG tablet Commonly known as: DESYREL Take 50-100 mg by mouth at bedtime as needed for sleep.   venlafaxine XR 150 MG 24 hr capsule Commonly known as: Effexor XR Take 2 capsules (300 mg total) by mouth daily with breakfast.   vitamin B-12 500 MCG tablet Commonly known as: CYANOCOBALAMIN Take 500 mcg by mouth daily.               Durable Medical Equipment  (From admission, onward)           Start     Ordered   09/19/21 1307  For home use only DME Nebulizer machine  Once       Question Answer Comment  Patient needs a nebulizer to treat with the following condition COPD (chronic obstructive pulmonary disease) ([place])   Length of Need Lifetime      09/19/21 1306            Consultations: Nephrology Gastroenterology  Procedures/Studies:   CT ABDOMEN PELVIS WO CONTRAST  Result Date: 09/18/2021 CLINICAL DATA:  Abdominal pain, acute,  nonlocalized. Shortness of breath for 3-4 weeks with hypoxia. Missed dialysis EXAM: CT ABDOMEN AND PELVIS WITHOUT CONTRAST TECHNIQUE: Multidetector CT imaging of the abdomen and pelvis was performed following the standard protocol without IV contrast. COMPARISON:  08/16/2021 FINDINGS: Lower chest: Small bilateral pleural effusions with associated atelectasis or consolidation within the lingula and bilateral lower lobes. Mild cardiomegaly. Extensive coronary artery calcification. Trace pericardial effusion. Relative hypoattenuation of the cardiac blood pool indicative of anemia. Hepatobiliary: Unremarkable unenhanced appearance of the liver. No focal liver lesion identified. Gallbladder within normal limits. No hyperdense gallstone. No biliary dilatation. Pancreas: Grossly unremarkable, although evaluation is limited in the absence of intravenous contrast and relative paucity of intra-fat. Spleen: Normal in size without focal abnormality. Adrenals/Urinary Tract: Unremarkable adrenal glands. Advanced left renal atrophy. No renal stone or hydronephrosis. Urinary bladder unremarkable for the degree of distension. Stomach/Bowel: Evaluation limited in the absence of intravenous or enteric contrast. Stomach is within normal limits. No evidence of bowel wall thickening, distention, or inflammatory changes. Vascular/Lymphatic: Severe, age advanced atherosclerotic calcifications throughout the aortoiliac axis. Bilateral iliac stents. No abdominopelvic lymphadenopathy is evident. Reproductive: Status post hysterectomy. No adnexal masses. Other: No free fluid. No abdominopelvic fluid collection. No pneumoperitoneum. No abdominal wall hernia. Musculoskeletal: Diffuse anasarca.  No acute osseous abnormality. IMPRESSION: 1. Small bilateral pleural effusions with associated atelectasis or consolidation within the lingula and bilateral lower lobes. 2. No acute findings identified within the abdomen or pelvis. 3. Diffuse anasarca. 4.  Severe, age advanced atherosclerotic calcifications throughout the aortoiliac axis (ICD10-I70.0). Electronically Signed   By: Davina Poke D.O.   On: 09/18/2021 14:16   DG Chest 2 View  Result Date: 09/18/2021 CLINICAL DATA:  Shortness of breath EXAM: CHEST - 2 VIEW COMPARISON:  Chest radiograph dated August 18, 2021. FINDINGS: The heart is enlarged.  Small bilateral pleural effusions. Bibasilar opacities which may represent pulmonary edema or infiltrate. Right IJ access double-lumen catheter is noted. The osseous structures are unremarkable. IMPRESSION: Stable cardiomegaly with small bilateral pleural effusions. Bibasilar opacities which may represent atelectasis or infiltrate. Electronically Signed   By: Keane Police D.O.   On: 09/18/2021 14:00       The results of significant diagnostics from this hospitalization (including imaging, microbiology, ancillary and laboratory) are listed below for reference.     Microbiology: Recent Results (from the past 240 hour(s))  Resp Panel by RT-PCR (Flu A&B, Covid) Nasopharyngeal Swab     Status: None   Collection Time: 09/18/21  1:20 PM   Specimen: Nasopharyngeal Swab; Nasopharyngeal(NP) swabs in vial transport medium  Result Value Ref Range Status   SARS Coronavirus 2 by RT PCR NEGATIVE NEGATIVE Final    Comment: (NOTE) SARS-CoV-2 target nucleic acids are NOT DETECTED.  The SARS-CoV-2 RNA is generally detectable in upper respiratory specimens during the acute phase of infection. The lowest concentration of SARS-CoV-2 viral copies this assay can detect is 138 copies/mL. A negative result does not preclude SARS-Cov-2 infection and should not be used as the sole basis for treatment or other patient management decisions. A negative result may occur with  improper specimen collection/handling, submission of specimen other than nasopharyngeal swab, presence of viral mutation(s) within the areas targeted by this assay, and inadequate number of  viral copies(<138 copies/mL). A negative result must be combined with clinical observations, patient history, and epidemiological information. The expected result is Negative.  Fact Sheet for Patients:  EntrepreneurPulse.com.au  Fact Sheet for Healthcare Providers:  IncredibleEmployment.be  This test is no t yet approved or cleared by the [place] FDA and  has been authorized for detection and/or diagnosis of SARS-CoV-2 by FDA under an Emergency Use Authorization (EUA). This EUA will remain  in effect (meaning this test can be used) for the duration of the COVID-19 declaration under Section 564(b)(1) of the Act, 21 U.S.C.section 360bbb-3(b)(1), unless the authorization is terminated  or revoked sooner.       Influenza A by PCR NEGATIVE NEGATIVE Final   Influenza B by PCR NEGATIVE NEGATIVE Final    Comment: (NOTE) The Xpert Xpress SARS-CoV-2/FLU/RSV plus assay is intended as an aid in the diagnosis of influenza from Nasopharyngeal swab specimens and should not be used as a sole basis for treatment. Nasal washings and aspirates are unacceptable for Xpert Xpress SARS-CoV-2/FLU/RSV testing.  Fact Sheet for Patients: EntrepreneurPulse.com.au  Fact Sheet for Healthcare Providers: IncredibleEmployment.be  This test is not yet approved or cleared by the [place] FDA and has been authorized for detection and/or diagnosis of SARS-CoV-2 by FDA under an Emergency Use Authorization (EUA). This EUA will remain in effect (meaning this test can be used) for the duration of the COVID-19 declaration under Section 564(b)(1) of the Act, 21 U.S.C. section 360bbb-3(b)(1), unless the authorization is terminated or revoked.  Performed at [facility], [place] [address] [place], [place] [address]      Labs:  CBC: Recent Labs  Lab 09/18/21 1317 09/18/21 2226 09/19/21 0324 09/20/21 0314 09/21/21 0431   WBC 10.5  --  7.2 11.9*  --   NEUTROABS 8.4*  --   --   --   --   HGB 8.2* 8.0* 7.5* 7.6* 9.4*  HCT 26.2* 24.5* 23.9* 25.1* 29.8*  MCV 98.5  --  98.8 102.4*  --   PLT 392  --  273 293  --  BMP &GFR Recent Labs  Lab 09/18/21 1317 09/19/21 0324 09/20/21 0314 09/21/21 0431  NA 134* 136 131* 132*  K 4.7 4.1 4.9 3.8  CL 98 100 94* 95*  CO2 24 25 27 29   GLUCOSE 125* 121* 123* 104*  BUN 40* 16 40* 21*  CREATININE 4.73* 2.60* 4.35* 2.73*  CALCIUM 8.7* 8.6* 8.8* 8.6*  MG  --   --   --  2.0  PHOS  --  3.7 2.8 3.3   Estimated Creatinine Clearance: 13.8 mL/min (A) (by C-G formula based on SCr of 2.73 mg/dL (H)). Liver & Pancreas: Recent Labs  Lab 09/18/21 1317 09/19/21 0324 09/20/21 0314 09/21/21 0431  AST 111* 96* 57* 44*  ALT 50* 53* 40 34  ALKPHOS 189* 170* 159* 159*  BILITOT 0.9 0.7 0.6 0.8  PROT 6.2* 5.7* 6.2* 6.6  ALBUMIN 2.6* 2.4* 2.7*   2.7* 2.8*   Recent Labs  Lab 09/18/21 1317  LIPASE 34   No results for input(s): AMMONIA in the last 168 hours. Diabetic: No results for input(s): HGBA1C in the last 72 hours. No results for input(s): GLUCAP in the last 168 hours. Cardiac Enzymes: Recent Labs  Lab 09/21/21 0431  CKTOTAL 70   No results for input(s): PROBNP in the last 8760 hours. Coagulation Profile: No results for input(s): INR, PROTIME in the last 168 hours. Thyroid Function Tests: No results for input(s): TSH, T4TOTAL, FREET4, T3FREE, THYROIDAB in the last 72 hours. Lipid Profile: No results for input(s): CHOL, HDL, LDLCALC, TRIG, CHOLHDL, LDLDIRECT in the last 72 hours. Anemia Panel: Recent Labs    09/18/21 2226  RETICCTPCT 5.8*   Urine analysis:    Component Value Date/Time   COLORURINE YELLOW 08/20/2021 0140   APPEARANCEUR CLEAR 08/20/2021 0140   APPEARANCEUR Cloudy (A) 03/22/2021 1432   LABSPEC 1.012 08/20/2021 0140   LABSPEC 1.016 06/07/2012 1350   PHURINE 5.0 08/20/2021 0140   GLUCOSEU 50 (A) 08/20/2021 0140   GLUCOSEU Negative  06/07/2012 1350   HGBUR SMALL (A) 08/20/2021 0140   BILIRUBINUR NEGATIVE 08/20/2021 0140   BILIRUBINUR Negative 03/22/2021 1432   BILIRUBINUR Negative 06/07/2012 1350   KETONESUR NEGATIVE 08/20/2021 0140   PROTEINUR 100 (A) 08/20/2021 0140   UROBILINOGEN 0.2 06/28/2014 1518   NITRITE NEGATIVE 08/20/2021 0140   LEUKOCYTESUR NEGATIVE 08/20/2021 0140   LEUKOCYTESUR Negative 06/07/2012 1350   Sepsis Labs: Invalid input(s): PROCALCITONIN, LACTICIDVEN   Time coordinating discharge: 55 minutes  SIGNED:  Mercy Riding, MD  [facility] 09/21/2021, 4:43 PM

## 2021-09-21 NOTE — TOC Progression Note (Signed)
Transition of Care [facility]) - Progression Note    Patient Details  Name: [name] MRN: [id number] Date of Birth: 04/01/1961  Transition of Care [facility]) CM/SW Contact  Jacalyn Lefevre Edson Snowball, RN Phone Number: 09/21/2021, 10:07 AM  Clinical Narrative:    Patient being discharged today. PAtient aware and states family can bring her portable oxygen tank from home when they come to get her. NEB machine ordered with Freda Munro with [employer] and will be delivered to patient's hospital room prior to discharge. Patient aware.   Expected Discharge Plan: Home/Self Care Barriers to Discharge: Continued Medical Work up  Expected Discharge Plan and Services Expected Discharge Plan: Home/Self Care   Discharge Planning Services: CM Consult   Living arrangements for the past 2 months: Single Family Home Expected Discharge Date: 09/21/21               DME Arranged: N/A         HH Arranged: NA           Social Determinants of Health (SDOH) Interventions    Readmission Risk Interventions No flowsheet data found.

## 2021-09-21 NOTE — Progress Notes (Signed)
Discharge instructions given to pt. Pt verbalized understanding of all teaching. Pt currently in room waiting on ride

## 2021-09-21 NOTE — Progress Notes (Signed)
D/C order noted. Contacted [name] to advise clinic of pt's d/c today and to resume care tomorrow. Clinic info placed on AVS for pt's reference. Spoke to pt yesterday via phone regarding days/time at [name] at d/c. Contacted renal NP regarding pt resuming care at [facility] at d/c.   Melven Sartorius Renal Navigator [phone]

## 2021-09-21 NOTE — Progress Notes (Signed)
Mobility Specialist Progress Note:   09/21/21 1030  Mobility  Activity Ambulated in room  Level of Assistance Minimal assist, patient does 75% or more  Assistive Device Other (Comment) (HHA)  Distance Ambulated (ft) 120 ft  Mobility Ambulated with assistance in room  Mobility Response Tolerated well  Mobility performed by Mobility specialist  $Mobility charge 1 Mobility    During Mobility: SpO2 91% Post Mobility: SpO2 88%.  Pt received on RA in bed. SpO2 stayed at or above 88% during ambulation, pt c/o SOB. Pt much more steady with HHA today, still with generalized weakness. Eager for d/c, pt left sitting EOB with all needs met.   Nelta Numbers Mobility Specialist  Phone [phone]

## 2021-09-21 NOTE — Progress Notes (Signed)
Physical Therapy Treatment Patient Details Name: [name] MRN: [id number] DOB: 1961/06/15 Today's Date: 09/21/2021   History of Present Illness [name] is a 60 y.o. female admitted 09/18/21 with SOB and cough, fever, maroon diarrhea, abdominal cramping. CXR shows bibasilar opacities consistent with atelectasis vs. Infiltrates. Admitted with acute respitatory failure secondary to COPD exacerbation likely due to noncompliant home oxgyen use. PMH includes: CKD stage V recently started HD, COPD Gold stage III noncompliant with home oxygen, IBS with chronic diarrhea, HTN, chronic diastolic CHF, CAD, PVD, s/p  bilateral common iliac stenting, severe protein calorie malnutrition, chronic anemia secondary to CKD.    PT Comments    Pt supine in bed on arrival this session.  Pt continues to benefit from endurance training as she fatigues quickly during short walks.  Pt reports she will obtain a WC if needed.  Updated recs for rollator for home use.     Recommendations for follow up therapy are one component of a multi-disciplinary discharge planning process, led by the attending physician.  Recommendations may be updated based on patient status, additional functional criteria and insurance authorization.  Follow Up Recommendations  No PT follow up     Assistance Recommended at Discharge Intermittent Supervision/Assistance  Equipment Recommendations  Rollator (4 wheels) (if she is willing to use it)    Recommendations for Other Services       Precautions / Restrictions Precautions Precautions: Fall;Other (comment) Precaution Comments: watch O2 Restrictions Weight Bearing Restrictions: No     Mobility  Bed Mobility Overal bed mobility: Modified Independent             General bed mobility comments: increased time, but no physical assist.    Transfers Overall transfer level: Modified independent   Transfers: Sit to/from Stand                   Ambulation/Gait Ambulation/Gait assistance: Supervision;Min guard (pt held to PTA hands when fatigued, would benefit from rollator use but reports she will use WC for longer distances.) Gait Distance (Feet): 150 Feet Assistive device: None Gait Pattern/deviations: Step-through pattern;Decreased stride length;Narrow base of support;Scissoring;Staggering left;Staggering right Gait velocity: decreased     General Gait Details: Slow, unsteady gait with narrow BoS, instances of scissoring and staggering noted. 2-3/4 DOE. Sp02 stayed >92% on 2L/min 02 [place].   Stairs             Wheelchair Mobility    Modified Rankin (Stroke Patients Only)       Balance     Sitting balance-Leahy Scale: Good       Standing balance-Leahy Scale: Fair                              Cognition Arousal/Alertness: Awake/alert Behavior During Therapy: WFL for tasks assessed/performed Overall Cognitive Status: No family/caregiver present to determine baseline cognitive functioning                                          Exercises      General Comments        Pertinent Vitals/Pain Pain Assessment: No/denies pain    Home Living                          Prior Function  PT Goals (current goals can now be found in the care plan section) Acute Rehab PT Goals Patient Stated Goal: to go home Potential to Achieve Goals: Good Progress towards PT goals: Progressing toward goals    Frequency    Min 3X/week      PT Plan Current plan remains appropriate    Co-evaluation              AM-PAC PT "6 Clicks" Mobility   Outcome Measure  Help needed turning from your back to your side while in a flat bed without using bedrails?: None Help needed moving from lying on your back to sitting on the side of a flat bed without using bedrails?: None Help needed moving to and from a bed to a chair (including a wheelchair)?: A Little Help  needed standing up from a chair using your arms (e.g., wheelchair or bedside chair)?: A Little Help needed to walk in hospital room?: A Little Help needed climbing 3-5 steps with a railing? : A Little 6 Click Score: 20    End of Session Equipment Utilized During Treatment: Oxygen Activity Tolerance: Patient limited by fatigue;Patient tolerated treatment well Patient left: in bed;with call bell/phone within reach;with bed alarm set (sitting edge of bed.) Nurse Communication: Mobility status PT Visit Diagnosis: Muscle weakness (generalized) (M62.81);Unsteadiness on feet (R26.81);Other abnormalities of gait and mobility (R26.89)     Time: 6384-5364 PT Time Calculation (min) (ACUTE ONLY): 8 min  Charges:  $Gait Training: 8-22 mins                     Erasmo Leventhal , PTA Acute Rehabilitation Services Pager [phone] Office [phone]    Abrie Egloff Eli Hose 09/21/2021, 1:55 PM

## 2021-09-21 NOTE — Progress Notes (Signed)
Patient discharged to home via wheelchair with all belongings, and medications.

## 2021-09-21 NOTE — Progress Notes (Signed)
Home oxygen tank taken with patient at discharge

## 2021-09-21 NOTE — Progress Notes (Signed)
Patient ID: MIRELY PANGLE, female   DOB: 03/17/1961, 60 y.o.   MRN: [id number] S: Feels better this morning. O:BP (!) 154/80 (BP Location: Right Arm)    Pulse 71    Temp 97.8 F (36.6 C) (Oral)    Resp 18    Ht 5\' 1"  (1.549 m)    Wt 39 kg    SpO2 94%    BMI 16.25 kg/m   Intake/Output Summary (Last 24 hours) at 09/21/2021 1042 Last data filed at 09/21/2021 1010 Gross per 24 hour  Intake 795 ml  Output 2000 ml  Net -1205 ml   Intake/Output: I/O last 3 completed shifts: In: 555 [P.O.:240; Blood:315] Out: 2000 [Other:2000]  Intake/Output this shift:  Total I/O In: 240 [P.O.:240] Out: -  Weight change:  Gen:NAD CVS: RRR Resp: occ rhonchi bilaterally Abd: +BS, soft, mildly tender to palpation Ext: no edema, LUE AVG +T/B  Recent Labs  Lab 09/18/21 1317 09/19/21 0324 09/20/21 0314 09/21/21 0431  NA 134* 136 131* 132*  K 4.7 4.1 4.9 3.8  CL 98 100 94* 95*  CO2 24 25 27 29   GLUCOSE 125* 121* 123* 104*  BUN 40* 16 40* 21*  CREATININE 4.73* 2.60* 4.35* 2.73*  ALBUMIN 2.6* 2.4* 2.7*   2.7* 2.8*  CALCIUM 8.7* 8.6* 8.8* 8.6*  PHOS  --  3.7 2.8 3.3  AST 111* 96* 57* 44*  ALT 50* 53* 40 34   Liver Function Tests: Recent Labs  Lab 09/19/21 0324 09/20/21 0314 09/21/21 0431  AST 96* 57* 44*  ALT 53* 40 34  ALKPHOS 170* 159* 159*  BILITOT 0.7 0.6 0.8  PROT 5.7* 6.2* 6.6  ALBUMIN 2.4* 2.7*   2.7* 2.8*   Recent Labs  Lab 09/18/21 1317  LIPASE 34   No results for input(s): AMMONIA in the last 168 hours. CBC: Recent Labs  Lab 09/18/21 1317 09/18/21 2226 09/19/21 0324 09/20/21 0314 09/21/21 0431  WBC 10.5  --  7.2 11.9*  --   NEUTROABS 8.4*  --   --   --   --   HGB 8.2*   < > 7.5* 7.6* 9.4*  HCT 26.2*   < > 23.9* 25.1* 29.8*  MCV 98.5  --  98.8 102.4*  --   PLT 392  --  273 293  --    < > = values in this interval not displayed.   Cardiac Enzymes: Recent Labs  Lab 09/21/21 0431  CKTOTAL 70   CBG: No results for input(s): GLUCAP in the last 168  hours.  Iron Studies:  Recent Labs    09/18/21 1516  IRON 60  TIBC 270  FERRITIN 1,304*   Studies/Results: No results found.  sodium chloride   Intravenous Once   amLODipine  10 mg Oral Daily   azithromycin  500 mg Oral Daily   calcium acetate  2,001 mg Oral TID WC   calcium acetate  667 mg Oral With snacks   calcium-vitamin D  1 tablet Oral Q breakfast   Chlorhexidine Gluconate Cloth  6 each Topical Q0600   feeding supplement  237 mL Oral BID BM   isosorbide mononitrate  30 mg Oral Daily   labetalol  100 mg Oral BID   linaclotide  145 mcg Oral QAC breakfast   loratadine  10 mg Oral Daily   megestrol  40 mg Oral Daily   mometasone-formoterol  2 puff Inhalation Q12H   multivitamin  1 tablet Oral QHS   pantoprazole  40 mg  Oral Daily   predniSONE  40 mg Oral Q breakfast   promethazine  25 mg Oral Daily   venlafaxine XR  300 mg Oral Q breakfast   vitamin B-12  500 mcg Oral Daily    BMET    Component Value Date/Time   NA 132 (L) 09/21/2021 0431   NA 141 06/24/2020 1646   NA 134 (L) 06/25/2014 1034   K 3.8 09/21/2021 0431   K 4.8 06/25/2014 1034   CL 95 (L) 09/21/2021 0431   CL 102 06/25/2014 1034   CO2 29 09/21/2021 0431   CO2 28 06/25/2014 1034   GLUCOSE 104 (H) 09/21/2021 0431   GLUCOSE 90 06/25/2014 1034   BUN 21 (H) 09/21/2021 0431   BUN 27 (H) 06/24/2020 1646   BUN 14 06/25/2014 1034   CREATININE 2.73 (H) 09/21/2021 0431   CREATININE 1.23 06/25/2014 1034   CALCIUM 8.6 (L) 09/21/2021 0431   CALCIUM 9.1 06/25/2014 1034   GFRNONAA 20 (L) 09/21/2021 0431   GFRNONAA 49 (L) 06/25/2014 1034   GFRNONAA >60 06/07/2012 1350   GFRAA 25 (L) 06/24/2020 1646   GFRAA 59 (L) 06/25/2014 1034   GFRAA >60 06/07/2012 1350   CBC    Component Value Date/Time   WBC 11.9 (H) 09/20/2021 0314   RBC 2.45 (L) 09/20/2021 0314   HGB 9.4 (L) 09/21/2021 0431   HGB 10.8 (L) 03/31/2020 1336   HCT 29.8 (L) 09/21/2021 0431   HCT 35.1 03/31/2020 1336   PLT 293 09/20/2021 0314   PLT  405 03/31/2020 1336   MCV 102.4 (H) 09/20/2021 0314   MCV 89 03/31/2020 1336   MCV 89 06/25/2014 1034   MCH 31.0 09/20/2021 0314   MCHC 30.3 09/20/2021 0314   RDW 20.8 (H) 09/20/2021 0314   RDW 18.0 (H) 03/31/2020 1336   RDW 16.8 (H) 06/25/2014 1034   LYMPHSABS 1.2 09/18/2021 1317   MONOABS 0.6 09/18/2021 1317   EOSABS 0.0 09/18/2021 1317   BASOSABS 0.1 09/18/2021 1317    Dialysis Orders:  MWF - [facility] (recent trasfer from [place])  3hrs28min, BFR 400, DFR Auto 1.5, EDW 42kg (recently lowered in outpatient 09/15/21), 3K/ 2.5Ca Heparin No Heparin bolus Mircera 75 mcg q2wks - last 09/15/21 Calcitriol 0.33mcg PO qHD-last 09/15/21 Home meds: Calcium Acetate 667mg  3 tabs TID with meals   Assessment/Plan: Acute Hypoxic Respiratory Failure-more likely COPD exacerbation and fluid overload, not compliant with home O2.  Improved after HD and UF. COPD Exacerbation - weaning O2 slowly; continue short course steroids and PO ABX. Continue LABA and Duo nebs ESRD - EDW recently lowered in outpatient.  tolerated HD yesterday and is below usual edw, new EDW is 39 kg.  Plan for HD tomorrow as an outpatient.  Hypertension/volume  - improved with UF and new lower edw of 39kg.  Anemia of CKD - Hgb now 8.2; it was actually 7.3 in outpatient; ESA dose not due yet Secondary Hyperparathyroidism -  Corr Ca okay, will check PO4 in AM, continue binders for now. Nutrition - Renal diet with fluid restriction. Albumin 2.6, will add protein supplements Disposition - for discharge to home today.  Donetta Potts, MD [employer] [phone]

## 2021-09-23 ENCOUNTER — Telehealth: Payer: Self-pay | Admitting: Nurse Practitioner

## 2021-09-23 NOTE — Telephone Encounter (Signed)
Transition of care contact from inpatient facility  Date of Discharge: 09/21/2021 Date of Contact: 09/23/2021 Method of contact: Phone  Attempted to contact patient to discuss transition of care from inpatient admission. Patient did not answer the phone. Message was left on the patient's voicemail with call back number [phone].

## 2021-09-23 NOTE — Telephone Encounter (Signed)
Transition of care contact from inpatient facility  Date of Discharge:  Date of Contact: Method of contact: Phone  Attempted to contact patient to discuss transition of care from inpatient admission. Patient did not answer the phone. Message was left on the patient's voicemail with call back number [phone].

## 2021-09-29 ENCOUNTER — Other Ambulatory Visit: Payer: Self-pay

## 2021-09-29 ENCOUNTER — Encounter (HOSPITAL_COMMUNITY): Payer: Self-pay

## 2021-09-29 ENCOUNTER — Emergency Department (HOSPITAL_COMMUNITY): Payer: Medicare Other

## 2021-09-29 ENCOUNTER — Observation Stay (HOSPITAL_COMMUNITY)
Admission: EM | Admit: 2021-09-29 | Discharge: 2021-10-01 | Disposition: A | Discharge status: Home / Self Care | Payer: Medicare Other | Attending: Internal Medicine | Admitting: Internal Medicine

## 2021-09-29 DIAGNOSIS — Z85828 Personal history of other malignant neoplasm of skin: Secondary | ICD-10-CM | POA: Diagnosis not present

## 2021-09-29 DIAGNOSIS — Z87891 Personal history of nicotine dependence: Secondary | ICD-10-CM | POA: Insufficient documentation

## 2021-09-29 DIAGNOSIS — Z8541 Personal history of malignant neoplasm of cervix uteri: Secondary | ICD-10-CM | POA: Insufficient documentation

## 2021-09-29 DIAGNOSIS — R0602 Shortness of breath: Secondary | ICD-10-CM | POA: Diagnosis present

## 2021-09-29 DIAGNOSIS — F319 Bipolar disorder, unspecified: Secondary | ICD-10-CM | POA: Diagnosis present

## 2021-09-29 DIAGNOSIS — E039 Hypothyroidism, unspecified: Secondary | ICD-10-CM | POA: Diagnosis not present

## 2021-09-29 DIAGNOSIS — Z79899 Other long term (current) drug therapy: Secondary | ICD-10-CM | POA: Insufficient documentation

## 2021-09-29 DIAGNOSIS — Z515 Encounter for palliative care: Secondary | ICD-10-CM

## 2021-09-29 DIAGNOSIS — Z9861 Coronary angioplasty status: Secondary | ICD-10-CM | POA: Diagnosis not present

## 2021-09-29 DIAGNOSIS — Z20822 Contact with and (suspected) exposure to covid-19: Secondary | ICD-10-CM | POA: Insufficient documentation

## 2021-09-29 DIAGNOSIS — I509 Heart failure, unspecified: Secondary | ICD-10-CM | POA: Insufficient documentation

## 2021-09-29 DIAGNOSIS — E8770 Fluid overload, unspecified: Secondary | ICD-10-CM | POA: Diagnosis not present

## 2021-09-29 DIAGNOSIS — Z7189 Other specified counseling: Secondary | ICD-10-CM

## 2021-09-29 DIAGNOSIS — N289 Disorder of kidney and ureter, unspecified: Principal | ICD-10-CM

## 2021-09-29 DIAGNOSIS — Z992 Dependence on renal dialysis: Secondary | ICD-10-CM | POA: Diagnosis not present

## 2021-09-29 DIAGNOSIS — I2510 Atherosclerotic heart disease of native coronary artery without angina pectoris: Secondary | ICD-10-CM | POA: Diagnosis not present

## 2021-09-29 DIAGNOSIS — J45909 Unspecified asthma, uncomplicated: Secondary | ICD-10-CM | POA: Insufficient documentation

## 2021-09-29 DIAGNOSIS — I10 Essential (primary) hypertension: Secondary | ICD-10-CM | POA: Diagnosis present

## 2021-09-29 DIAGNOSIS — N186 End stage renal disease: Secondary | ICD-10-CM | POA: Diagnosis not present

## 2021-09-29 DIAGNOSIS — I132 Hypertensive heart and chronic kidney disease with heart failure and with stage 5 chronic kidney disease, or end stage renal disease: Secondary | ICD-10-CM | POA: Insufficient documentation

## 2021-09-29 DIAGNOSIS — G894 Chronic pain syndrome: Secondary | ICD-10-CM | POA: Diagnosis present

## 2021-09-29 DIAGNOSIS — I5030 Unspecified diastolic (congestive) heart failure: Secondary | ICD-10-CM | POA: Diagnosis present

## 2021-09-29 DIAGNOSIS — J441 Chronic obstructive pulmonary disease with (acute) exacerbation: Secondary | ICD-10-CM | POA: Diagnosis not present

## 2021-09-29 DIAGNOSIS — E785 Hyperlipidemia, unspecified: Secondary | ICD-10-CM | POA: Diagnosis present

## 2021-09-29 LAB — CBC WITH DIFFERENTIAL/PLATELET
Abs Immature Granulocytes: 0.05 10*3/uL (ref 0.00–0.07)
Basophils Absolute: 0 10*3/uL (ref 0.0–0.1)
Basophils Relative: 0 %
Eosinophils Absolute: 0.1 10*3/uL (ref 0.0–0.5)
Eosinophils Relative: 1 %
HCT: 30.2 % — ABNORMAL LOW (ref 36.0–46.0)
Hemoglobin: 9.4 g/dL — ABNORMAL LOW (ref 12.0–15.0)
Immature Granulocytes: 1 %
Lymphocytes Relative: 5 %
Lymphs Abs: 0.5 10*3/uL — ABNORMAL LOW (ref 0.7–4.0)
MCH: 31.5 pg (ref 26.0–34.0)
MCHC: 31.1 g/dL (ref 30.0–36.0)
MCV: 101.3 fL — ABNORMAL HIGH (ref 80.0–100.0)
Monocytes Absolute: 0.4 10*3/uL (ref 0.1–1.0)
Monocytes Relative: 4 %
Neutro Abs: 9.9 10*3/uL — ABNORMAL HIGH (ref 1.7–7.7)
Neutrophils Relative %: 89 %
Platelets: 295 10*3/uL (ref 150–400)
RBC: 2.98 MIL/uL — ABNORMAL LOW (ref 3.87–5.11)
RDW: 20.3 % — ABNORMAL HIGH (ref 11.5–15.5)
WBC: 11 10*3/uL — ABNORMAL HIGH (ref 4.0–10.5)
nRBC: 0 % (ref 0.0–0.2)

## 2021-09-29 LAB — BASIC METABOLIC PANEL
Anion gap: 14 (ref 5–15)
BUN: 31 mg/dL — ABNORMAL HIGH (ref 6–20)
CO2: 23 mmol/L (ref 22–32)
Calcium: 9 mg/dL (ref 8.9–10.3)
Chloride: 101 mmol/L (ref 98–111)
Creatinine, Ser: 3.05 mg/dL — ABNORMAL HIGH (ref 0.44–1.00)
GFR, Estimated: 17 mL/min — ABNORMAL LOW (ref >60)
Glucose, Bld: 118 mg/dL — ABNORMAL HIGH (ref 70–99)
Potassium: 4 mmol/L (ref 3.5–5.1)
Sodium: 138 mmol/L (ref 135–145)

## 2021-09-29 LAB — D-DIMER, QUANTITATIVE: D-Dimer, Quant: 3.45 ug/mL-FEU — ABNORMAL HIGH (ref 0.00–0.50)

## 2021-09-29 LAB — RESP PANEL BY RT-PCR (FLU A&B, COVID) ARPGX2
Influenza A by PCR: NEGATIVE
Influenza B by PCR: NEGATIVE
SARS Coronavirus 2 by RT PCR: NEGATIVE

## 2021-09-29 LAB — TSH: TSH: 1.651 u[IU]/mL (ref 0.350–4.500)

## 2021-09-29 MED ORDER — OXYCODONE-ACETAMINOPHEN 5-325 MG PO TABS
1.0000 | ORAL_TABLET | Freq: Two times a day (BID) | ORAL | Status: DC | PRN
  Administered 2021-09-30 (×2): 1 via ORAL
  Filled 2021-09-29 (×2): qty 1

## 2021-09-29 MED ORDER — LORAZEPAM 2 MG/ML IJ SOLN
INTRAMUSCULAR | Status: AC
  Filled 2021-09-29: qty 1

## 2021-09-29 MED ORDER — RENA-VITE PO TABS
1.0000 | ORAL_TABLET | Freq: Every day | ORAL | Status: DC
  Administered 2021-09-29 – 2021-09-30 (×2): 1 via ORAL
  Filled 2021-09-29 (×2): qty 1

## 2021-09-29 MED ORDER — CHLORHEXIDINE GLUCONATE CLOTH 2 % EX PADS
6.0000 | MEDICATED_PAD | Freq: Every day | CUTANEOUS | Status: DC
  Administered 2021-09-30 – 2021-10-01 (×2): 6 via TOPICAL

## 2021-09-29 MED ORDER — ATORVASTATIN CALCIUM 80 MG PO TABS
80.0000 mg | ORAL_TABLET | Freq: Every day | ORAL | Status: DC
  Administered 2021-09-29 – 2021-09-30 (×2): 80 mg via ORAL
  Filled 2021-09-29 (×2): qty 1

## 2021-09-29 MED ORDER — VENLAFAXINE HCL ER 150 MG PO CP24
300.0000 mg | ORAL_CAPSULE | Freq: Every day | ORAL | Status: DC
  Administered 2021-09-30 – 2021-10-01 (×2): 300 mg via ORAL
  Filled 2021-09-29 (×3): qty 2

## 2021-09-29 MED ORDER — LORAZEPAM 2 MG/ML IJ SOLN
1.0000 mg | Freq: Once | INTRAMUSCULAR | Status: AC
  Administered 2021-09-29: 12:00:00 1 mg via INTRAVENOUS

## 2021-09-29 MED ORDER — LINACLOTIDE 72 MCG PO CAPS
72.0000 ug | ORAL_CAPSULE | Freq: Every day | ORAL | Status: DC
  Administered 2021-09-30 – 2021-10-01 (×2): 72 ug via ORAL
  Filled 2021-09-29 (×3): qty 1

## 2021-09-29 MED ORDER — CALCIUM ACETATE (PHOS BINDER) 667 MG PO CAPS
2001.0000 mg | ORAL_CAPSULE | Freq: Three times a day (TID) | ORAL | Status: DC
  Administered 2021-09-29 – 2021-10-01 (×6): 2001 mg via ORAL
  Filled 2021-09-29 (×6): qty 3

## 2021-09-29 MED ORDER — SODIUM CHLORIDE 0.9 % IV SOLN
1.0000 g | Freq: Once | INTRAVENOUS | Status: AC
  Administered 2021-09-29: 10:00:00 1 g via INTRAVENOUS
  Filled 2021-09-29: qty 10

## 2021-09-29 MED ORDER — LABETALOL HCL 100 MG PO TABS
100.0000 mg | ORAL_TABLET | Freq: Two times a day (BID) | ORAL | Status: DC
  Administered 2021-09-29 – 2021-10-01 (×4): 100 mg via ORAL
  Filled 2021-09-29 (×3): qty 1
  Filled 2021-09-29: qty 0.5
  Filled 2021-09-29: qty 1

## 2021-09-29 MED ORDER — ALPRAZOLAM 0.5 MG PO TABS
1.0000 mg | ORAL_TABLET | Freq: Two times a day (BID) | ORAL | Status: DC | PRN
  Administered 2021-09-29 – 2021-09-30 (×2): 1 mg via ORAL
  Filled 2021-09-29 (×2): qty 2

## 2021-09-29 MED ORDER — EZETIMIBE 10 MG PO TABS
10.0000 mg | ORAL_TABLET | Freq: Every day | ORAL | Status: DC
  Administered 2021-09-30 – 2021-10-01 (×2): 10 mg via ORAL
  Filled 2021-09-29 (×2): qty 1

## 2021-09-29 MED ORDER — ISOSORBIDE MONONITRATE ER 30 MG PO TB24
30.0000 mg | ORAL_TABLET | Freq: Every day | ORAL | Status: DC
  Administered 2021-09-29 – 2021-10-01 (×3): 30 mg via ORAL
  Filled 2021-09-29 (×3): qty 1

## 2021-09-29 MED ORDER — SODIUM CHLORIDE 0.9% FLUSH
3.0000 mL | Freq: Two times a day (BID) | INTRAVENOUS | Status: DC
  Administered 2021-09-29 – 2021-10-01 (×4): 3 mL via INTRAVENOUS

## 2021-09-29 MED ORDER — HEPARIN SODIUM (PORCINE) 5000 UNIT/ML IJ SOLN
5000.0000 [IU] | Freq: Three times a day (TID) | INTRAMUSCULAR | Status: DC
  Administered 2021-09-29 – 2021-10-01 (×6): 5000 [IU] via SUBCUTANEOUS
  Filled 2021-09-29 (×6): qty 1

## 2021-09-29 MED ORDER — ACETAMINOPHEN 650 MG RE SUPP: 650.0000 mg | Freq: Four times a day (QID) | RECTAL | Status: DC | PRN

## 2021-09-29 MED ORDER — METHYLPREDNISOLONE SODIUM SUCC 125 MG IJ SOLR
125.0000 mg | Freq: Once | INTRAMUSCULAR | Status: AC
  Administered 2021-09-29: 10:00:00 125 mg via INTRAVENOUS
  Filled 2021-09-29: qty 2

## 2021-09-29 MED ORDER — CALCIUM ACETATE (PHOS BINDER) 667 MG PO CAPS
667.0000 mg | ORAL_CAPSULE | ORAL | Status: DC
  Administered 2021-09-30 (×2): 667 mg via ORAL
  Filled 2021-09-29 (×2): qty 1

## 2021-09-29 MED ORDER — IPRATROPIUM-ALBUTEROL 0.5-2.5 (3) MG/3ML IN SOLN
3.0000 mL | Freq: Four times a day (QID) | RESPIRATORY_TRACT | Status: DC
  Administered 2021-09-29 – 2021-10-01 (×6): 3 mL via RESPIRATORY_TRACT
  Filled 2021-09-29 (×8): qty 3

## 2021-09-29 MED ORDER — TRAZODONE HCL 50 MG PO TABS
50.0000 mg | ORAL_TABLET | Freq: Every evening | ORAL | Status: DC | PRN
Start: 2021-09-29 — End: 2021-10-01
  Administered 2021-09-30 (×2): 50 mg via ORAL
  Filled 2021-09-29 (×3): qty 1

## 2021-09-29 MED ORDER — SODIUM CHLORIDE 0.9 % IV SOLN
500.0000 mg | Freq: Once | INTRAVENOUS | Status: AC
  Administered 2021-09-29: 11:00:00 500 mg via INTRAVENOUS
  Filled 2021-09-29: qty 5

## 2021-09-29 MED ORDER — CALCIUM ACETATE (PHOS BINDER) 667 MG PO CAPS: 2001.0000 mg | ORAL_CAPSULE | ORAL | Status: DC

## 2021-09-29 MED ORDER — ALBUTEROL SULFATE (2.5 MG/3ML) 0.083% IN NEBU
5.0000 mg | INHALATION_SOLUTION | Freq: Once | RESPIRATORY_TRACT | Status: AC
  Administered 2021-09-29: 10:00:00 5 mg via RESPIRATORY_TRACT
  Filled 2021-09-29: qty 6

## 2021-09-29 MED ORDER — AMLODIPINE BESYLATE 10 MG PO TABS
10.0000 mg | ORAL_TABLET | Freq: Every day | ORAL | Status: DC
  Administered 2021-09-29 – 2021-10-01 (×3): 10 mg via ORAL
  Filled 2021-09-29 (×3): qty 1
  Filled 2021-09-29: qty 2

## 2021-09-29 MED ORDER — METOPROLOL TARTRATE 5 MG/5ML IV SOLN
INTRAVENOUS | Status: AC
  Filled 2021-09-29: qty 5

## 2021-09-29 MED ORDER — DOCUSATE SODIUM 283 MG RE ENEM
1.0000 | ENEMA | RECTAL | Status: DC | PRN
  Filled 2021-09-29: qty 1

## 2021-09-29 MED ORDER — ZOLPIDEM TARTRATE 5 MG PO TABS: 5.0000 mg | ORAL_TABLET | Freq: Every evening | ORAL | Status: DC | PRN

## 2021-09-29 MED ORDER — METHYLPREDNISOLONE SODIUM SUCC 125 MG IJ SOLR
60.0000 mg | Freq: Two times a day (BID) | INTRAMUSCULAR | Status: AC
  Administered 2021-09-29 – 2021-09-30 (×2): 60 mg via INTRAVENOUS
  Filled 2021-09-29 (×2): qty 2

## 2021-09-29 MED ORDER — ONDANSETRON HCL 4 MG PO TABS: 4.0000 mg | ORAL_TABLET | Freq: Four times a day (QID) | ORAL | Status: DC | PRN

## 2021-09-29 MED ORDER — MEGESTROL ACETATE 40 MG PO TABS
40.0000 mg | ORAL_TABLET | Freq: Every day | ORAL | Status: DC
  Administered 2021-09-30 – 2021-10-01 (×2): 40 mg via ORAL
  Filled 2021-09-29 (×3): qty 1

## 2021-09-29 MED ORDER — PANTOPRAZOLE SODIUM 40 MG PO TBEC
40.0000 mg | DELAYED_RELEASE_TABLET | Freq: Every day | ORAL | Status: DC
  Administered 2021-09-30 – 2021-10-01 (×2): 40 mg via ORAL
  Filled 2021-09-29 (×2): qty 1

## 2021-09-29 MED ORDER — METOPROLOL TARTRATE 5 MG/5ML IV SOLN
5.0000 mg | Freq: Four times a day (QID) | INTRAVENOUS | Status: DC | PRN
  Administered 2021-09-29: 13:00:00 5 mg via INTRAVENOUS
  Filled 2021-09-29: qty 5

## 2021-09-29 MED ORDER — ONDANSETRON HCL 4 MG/2ML IJ SOLN: 4.0000 mg | Freq: Four times a day (QID) | INTRAMUSCULAR | Status: DC | PRN

## 2021-09-29 MED ORDER — PREDNISONE 20 MG PO TABS
40.0000 mg | ORAL_TABLET | Freq: Every day | ORAL | Status: DC
  Administered 2021-09-30 – 2021-10-01 (×2): 40 mg via ORAL
  Filled 2021-09-29 (×2): qty 2

## 2021-09-29 MED ORDER — MOMETASONE FURO-FORMOTEROL FUM 100-5 MCG/ACT IN AERO
2.0000 | INHALATION_SPRAY | Freq: Two times a day (BID) | RESPIRATORY_TRACT | Status: DC
Start: 2021-09-29 — End: 2021-10-01
  Administered 2021-09-30 – 2021-10-01 (×3): 2 via RESPIRATORY_TRACT
  Filled 2021-09-29: qty 8.8

## 2021-09-29 MED ORDER — NEPRO/CARBSTEADY PO LIQD
237.0000 mL | Freq: Three times a day (TID) | ORAL | Status: DC | PRN
  Filled 2021-09-29: qty 237

## 2021-09-29 MED ORDER — HYDROXYZINE HCL 25 MG PO TABS
25.0000 mg | ORAL_TABLET | Freq: Three times a day (TID) | ORAL | Status: DC | PRN
  Administered 2021-09-29: 18:00:00 25 mg via ORAL
  Filled 2021-09-29: qty 1

## 2021-09-29 MED ORDER — POLYETHYLENE GLYCOL 3350 17 G PO PACK: 17.0000 g | PACK | Freq: Two times a day (BID) | ORAL | Status: DC | PRN

## 2021-09-29 MED ORDER — CALCIUM CARBONATE ANTACID 1250 MG/5ML PO SUSP
500.0000 mg | Freq: Four times a day (QID) | ORAL | Status: DC | PRN
  Filled 2021-09-29: qty 5

## 2021-09-29 MED ORDER — ALBUTEROL SULFATE (2.5 MG/3ML) 0.083% IN NEBU: 2.5000 mg | INHALATION_SOLUTION | RESPIRATORY_TRACT | Status: DC | PRN

## 2021-09-29 MED ORDER — ACETAMINOPHEN 325 MG PO TABS: 650.0000 mg | ORAL_TABLET | Freq: Four times a day (QID) | ORAL | Status: DC | PRN

## 2021-09-29 MED ORDER — SORBITOL 70 % SOLN
30.0000 mL | Status: DC | PRN
  Filled 2021-09-29: qty 30

## 2021-09-29 MED ORDER — CAMPHOR-MENTHOL 0.5-0.5 % EX LOTN
1.0000 "application " | TOPICAL_LOTION | Freq: Three times a day (TID) | CUTANEOUS | Status: DC | PRN
  Filled 2021-09-29: qty 222

## 2021-09-29 MED ORDER — NITROGLYCERIN 0.4 MG SL SUBL: 0.4000 mg | SUBLINGUAL_TABLET | SUBLINGUAL | Status: DC | PRN

## 2021-09-29 MED ORDER — NICOTINE 14 MG/24HR TD PT24
14.0000 mg | MEDICATED_PATCH | Freq: Every day | TRANSDERMAL | Status: DC
  Administered 2021-09-29 – 2021-10-01 (×3): 14 mg via TRANSDERMAL
  Filled 2021-09-29 (×3): qty 1

## 2021-09-29 NOTE — ED Provider Notes (Signed)
[facility] EMERGENCY DEPARTMENT Provider Note   CSN: [id number] Arrival date & time: 09/29/21  2683     History Chief Complaint  Patient presents with   Shortness of Breath    [name] is a 60 y.o. female.  HPI 60 year old female history of aortic stenosis, COPD, CKD, on dialysis, hypertension, presents today complaining of increased dyspnea last night.  She reports that she has been on oxygen with dyspnea over the past 3 months.  She has had a chronic cough productive of yellow sputum.  She reported ports recent pneumonia.  However, she was at her baseline last night when she went to bed on her 2 L of oxygen.  She woke during the night with worsening dyspnea.  She had chills.  She had no definite fever.  She is continue to cough up the same amount of sputum.  She denies chest pain.  She has had nausea and some throat scratchiness.  She has not been vaccinated for COVID but reports that she did receive her flu shot.    Past Medical History:  Diagnosis Date   Aortic stenosis    abominal aorta, s/p distal aortic stent 09/28/18, 12/01/20   Bipolar 1 disorder (HCC)    Blood transfusion without reported diagnosis    Carpal tunnel syndrome    Cataract    forming   Cervical cancer ([place]) 1985   CHF (congestive heart failure) ([place])    Chronic kidney disease (CKD), stage V ([place])    COPD (chronic obstructive pulmonary disease) ([place]) 2000   Coronary artery disease    Diverticulitis 2008   GERD (gastroesophageal reflux disease)    Glaucoma    HLD (hyperlipidemia) 2013   Hypertension 2013   Hypovitaminosis D    IBS (irritable bowel syndrome) 2008   PAD (peripheral artery disease) (HCC)    Pancreatitis 10/2011   RLS (restless legs syndrome)    Schizophrenia ([place])    Skin cancer    Small bowel obstruction ([place]) 2008   Thyroid disease     Patient Active Problem List   Diagnosis Date Noted   COPD (chronic obstructive pulmonary disease) ([place]) 09/18/2021    Acute on chronic anemia    Nausea without vomiting    Acute hypoxemic respiratory failure ([place]) 08/16/2021   Dark stools    Diverticulosis    Adverse reaction to antiplatelet agent, sequela    AKI (acute kidney injury) ([place]) 05/11/2021   Chest pain, rule out acute myocardial infarction 05/09/2021   Abnormal EKG    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[place]) 12/30/2020   Chronic mesenteric ischemia ([place]) 12/28/2020   Polyp of ascending colon    AVM (arteriovenous malformation) of small bowel, acquired with hemorrhage    GIB (gastrointestinal bleeding) 08/23/2020   Hematochezia    Diverticulosis of colon without hemorrhage    Pain of upper abdomen    Angiodysplasia of colon 08/16/2020   Acute GI bleeding 08/05/2020   Acute blood loss anemia 08/05/2020   Renal artery stenosis ([place]) 05/10/2020   Abdominal aortic stenosis 11/30/2018   Pure hypercholesterolemia 10/22/2018   Abnormal stress test 03/21/2018   At high risk for injury related to fall 12/26/2017   Renal artery stenosis, native, bilateral ([place]) 10/27/2017   Benign neoplasm of transverse colon    Benign neoplasm of descending colon    Benign neoplasm of sigmoid colon    Abnormality on screening test 06/22/2016   Hypertensive urgency 11/01/2015   Chest pain 11/01/2015   History of  partial colectomy 10/19/2014   Renovascular hypertension, malignant 09/07/2014   Anemia, chronic disease 07/30/2014   Community acquired pneumonia 07/02/2014   Heart failure with preserved ejection fraction ([place]) 07/02/2014   COPD GOLD 0 / still smoking 07/02/2014   CKD (chronic kidney disease) stage 4, GFR 15-29 ml/min ([place]) 07/02/2014   Protein-calorie malnutrition, severe ([place]) 07/02/2014   Hyperparathyroidism, secondary renal ([place]) 04/30/2014   Postsurgical menopause 08/14/2013   Vaginal atrophy 08/14/2013   Screening for breast cancer 07/21/2012   Diverticulitis of colon with perforation 06/18/2012   Affective bipolar disorder ([place]) 02/02/2009    ADHESIONS, INTESTINAL W/OBSTRUCTION 08/10/2008   Irritable bowel syndrome 08/10/2008   Abdominal pain, generalized 08/10/2008   FECAL OCCULT BLOOD 08/10/2008   Anemia, iron deficiency 08/10/2008   Anxiety state 08/09/2008   DEPRESSION 08/09/2008   ASTHMA 08/09/2008   ESOPHAGEAL STRICTURE 08/09/2008   GERD 08/09/2008   HIATAL HERNIA 08/09/2008   Diverticulosis of large intestine 08/09/2008   ARTHRITIS 08/09/2008   HEADACHE, CHRONIC 08/09/2008   SKIN CANCER, HX OF 08/09/2008   COLONIC POLYPS, HYPERPLASTIC, HX OF 08/09/2008   Cigarette smoker 05/17/2008   Schizophrenia ([place]) 04/30/2008    Past Surgical History:  Procedure Laterality Date   ABDOMINAL ANGIOGRAM N/A 09/07/2014   Procedure: ABDOMINAL ANGIOGRAM;  Surgeon: Laverda Page, MD;  [facility];  Service: Cardiovascular;  Laterality: N/A;   ABDOMINAL AORTOGRAM W/LOWER EXTREMITY N/A 12/28/2020   Procedure: ABDOMINAL AORTOGRAM W/LOWER EXTREMITY;  Surgeon: Cherre Robins, MD;  Location: [facility] CV LAB;  Service: Cardiovascular;  Laterality: N/A;   APPENDECTOMY  10/01/1994   AV FISTULA PLACEMENT Left 09/14/2021   Procedure: INSERTION OF ARTERIOVENOUS LEFT UPPER GORE-TEX GRAFT ARM;  Surgeon: Cherre Robins, MD;  Location: [facility] OR;  Service: Vascular;  Laterality: Left;   BIOPSY  08/06/2020   Procedure: BIOPSY;  Surgeon: Jackquline Denmark, MD;  Location: [facility] ENDOSCOPY;  Service: Endoscopy;;   COLON SURGERY  10/01/2006   6 inches of colon removed due to obstruction   COLONOSCOPY WITH PROPOFOL N/A 10/25/2016   Procedure: COLONOSCOPY WITH PROPOFOL;  Surgeon: Milus Banister, MD;  Location: [facility] ENDOSCOPY;  Service: Endoscopy;  Laterality: N/A;   COLONOSCOPY WITH PROPOFOL N/A 08/07/2020   Procedure: COLONOSCOPY WITH PROPOFOL;  Surgeon: Jackquline Denmark, MD;  Location: [facility]OSCOPY;  Service: Endoscopy;  Laterality: N/A;   COLONOSCOPY WITH PROPOFOL N/A 08/27/2020   Procedure: COLONOSCOPY WITH PROPOFOL;  Surgeon: Lavena Bullion, DO;  Location: [facility] ENDOSCOPY;  Service: Gastroenterology;  Laterality: N/A;   CORONARY ANGIOPLASTY WITH STENT PLACEMENT  09/07/2014   "2"   ENTEROSCOPY N/A 08/27/2020   Procedure: ENTEROSCOPY;  Surgeon: Lavena Bullion, DO;  Location: [facility]OS[facility];  Service: Gastroenterology;  Laterality: N/A;  Push enteroscopy    ENTEROSCOPY N/A 06/12/2021   Procedure: ENTEROSCOPY;  Surgeon: Rush Landmark Telford Nab., MD;  Location: [facility]OS[facility];  Service: Gastroenterology;  Laterality: N/A;   ENTEROSCOPY N/A 08/18/2021   Procedure: ENTEROSCOPY;  Surgeon: Jerene Bears, MD;  Location: [facility];  Service: Gastroenterology;  Laterality: N/A;   ESOPHAGOGASTRODUODENOSCOPY (EGD) WITH PROPOFOL N/A 08/06/2020   Procedure: ESOPHAGOGASTRODUODENOSCOPY (EGD) WITH PROPOFOL;  Surgeon: Jackquline Denmark, MD;  Location: [facility];  Service: Endoscopy;  Laterality: N/A;   GIVENS CAPSULE STUDY N/A 08/24/2020   Procedure: GIVENS CAPSULE STUDY;  Surgeon: Lavena Bullion, DO;  Location: [facility];  Service: Gastroenterology;  Laterality: N/A;   GIVENS CAPSULE STUDY N/A 08/18/2021   Procedure: GIVENS CAPSULE;  Surgeon: Jerene Bears, MD;  Location: [facility];  Service: Gastroenterology;  Laterality: N/A;   HOT HEMOSTASIS N/A 08/07/2020   Procedure: HOT HEMOSTASIS (ARGON PLASMA COAGULATION/BICAP);  Surgeon: Jackquline Denmark, MD;  Location: [facility];  Service: Endoscopy;  Laterality: N/A;   HOT HEMOSTASIS N/A 08/27/2020   Procedure: HOT HEMOSTASIS (ARGON PLASMA COAGULATION/BICAP);  Surgeon: Lavena Bullion, DO;  Location: [facility];  Service: Gastroenterology;  Laterality: N/A;   IR FLUORO GUIDE CV LINE RIGHT  08/21/2021   IR US GUIDE VASC ACCESS RIGHT  08/21/2021   LEFT HEART CATH AND CORONARY ANGIOGRAPHY N/A 03/25/2018   Procedure: LEFT HEART CATH AND CORONARY ANGIOGRAPHY;  Surgeon: Nigel Mormon, MD;  Location: [facility] CV LAB;  Service: Cardiovascular;  Laterality: N/A;   LEFT HEART CATH AND  CORONARY ANGIOGRAPHY N/A 05/09/2021   Procedure: LEFT HEART CATH AND CORONARY ANGIOGRAPHY;  Surgeon: Adrian Prows, MD;  Location: [facility] CV LAB;  Service: Cardiovascular;  Laterality: N/A;   LEFT HEART CATHETERIZATION WITH CORONARY ANGIOGRAM N/A 09/07/2014   Procedure: LEFT HEART CATHETERIZATION WITH CORONARY ANGIOGRAM;  Surgeon: Laverda Page, MD;  Location: [facility] CATH LAB;  Service: Cardiovascular;  Laterality: N/A;   LOWER EXTREMITY ANGIOGRAPHY  10/29/2017   Procedure: Lower Extremity Angiography;  Surgeon: Adrian Prows, MD;  Location: [facility] CV LAB;  Service: Cardiovascular;;   PERIPHERAL VASCULAR CATHETERIZATION N/A 11/01/2015   Procedure: Renal Angiography;  Surgeon: Adrian Prows, MD;  Location: [facility] CV LAB;  Service: Cardiovascular;  Laterality: N/A;   PERIPHERAL VASCULAR CATHETERIZATION N/A 04/10/2016   Procedure: Renal Angiography;  Surgeon: Adrian Prows, MD;  Location: [facility] [place] CV LAB;  Service: Cardiovascular;  Laterality: N/A;   PERIPHERAL VASCULAR CATHETERIZATION  04/10/2016   Procedure: Peripheral Vascular Intervention;  Surgeon: Adrian Prows, MD;  Location: [facility] CV LAB;  Service: Cardiovascular;;   PERIPHERAL VASCULAR INTERVENTION  10/29/2017   Procedure: PERIPHERAL VASCULAR INTERVENTION;  Surgeon: Adrian Prows, MD;  Location: [facility] CV LAB;  Service: Cardiovascular;;   PERIPHERAL VASCULAR INTERVENTION Bilateral 12/28/2020   Procedure: PERIPHERAL VASCULAR INTERVENTION;  Surgeon: Cherre Robins, MD;  Location: [facility] CV LAB;  Service: Cardiovascular;  Laterality: Bilateral;   POLYPECTOMY  08/07/2020   Procedure: POLYPECTOMY;  Surgeon: Jackquline Denmark, MD;  Location: [facility];  Service: Endoscopy;;   POLYPECTOMY  08/27/2020   Procedure: POLYPECTOMY;  Surgeon: Lavena Bullion, DO;  Location: [facility];  Service: Gastroenterology;;   POLYPECTOMY     RENAL ANGIOGRAPHY N/A 10/29/2017   Procedure: RENAL ANGIOGRAPHY;  Surgeon: Adrian Prows, MD;  Location: [facility] CV LAB;  Service: Cardiovascular;  Laterality: N/A;   RENAL ANGIOGRAPHY N/A 05/10/2020   Procedure: RENAL ANGIOGRAPHY;  Surgeon: Nigel Mormon, MD;  Location: [facility] CV LAB;  Service: Cardiovascular;  Laterality: N/A;   RIGHT OOPHORECTOMY Right 10/01/1994   SUBMUCOSAL TATTOO INJECTION  06/12/2021   Procedure: SUBMUCOSAL TATTOO INJECTION;  Surgeon: Irving Copas., MD;  Location: [facility];  Service: Gastroenterology;;   TOTAL ABDOMINAL HYSTERECTOMY  10/02/1995   UPPER GASTROINTESTINAL ENDOSCOPY       OB History     Gravida  2   Para      Term      Preterm      AB  1   Living  1      SAB      IAB      Ectopic  1   Multiple      Live Births  1  Family History  Problem Relation Age of Onset   Other Mother        many bowel obstructions   Heart disease Mother    Colon polyps Mother    Kidney cancer Father    Bone cancer Father    Diabetes Father    Heart disease Father    Heart disease Brother    Colon cancer Paternal Grandfather    Diabetes Daughter    Esophageal cancer Neg Hx    Rectal cancer Neg Hx    Stomach cancer Neg Hx     Social History   Tobacco Use   Smoking status: Former    Packs/day: 1.50    Years: 40.00    Pack years: 60.00    Types: Cigarettes    Quit date: 08/2021    Years since quitting: 0.1   Smokeless tobacco: Never  Vaping Use   Vaping Use: Former   Substances: THC  Substance Use Topics   Alcohol use: No   Drug use: Yes    Frequency: 3.0 times per week    Types: Marijuana    Comment: last use 09/09/21    Home Medications Prior to Admission medications   Medication Sig Start Date End Date Taking? Authorizing Provider  albuterol (VENTOLIN HFA) 108 (90 Base) MCG/ACT inhaler Inhale 2 puffs into the lungs every 6 (six) hours as needed for wheezing or shortness of breath (asthma). 08/22/21   Aline August, MD  ALPRAZolam Duanne Moron) 1 MG tablet Take 1 mg by mouth 2 (two) times daily as  needed for anxiety. 04/15/17   [provider]  amLODipine (NORVASC) 10 MG tablet TAKE 1 TABLET BY MOUTH ONCE DAILY Patient taking differently: Take 10 mg by mouth daily. 04/13/21   Cantwell, Celeste C, PA-C  atorvastatin (LIPITOR) 80 MG tablet Take 80 mg by mouth at bedtime.    [provider]  azelastine (ASTELIN) 0.1 % nasal spray Place 1 spray into both nostrils daily as needed for allergies (seasonal allergies). 06/24/19   [provider]  calcium acetate (PHOSLO) 667 MG capsule Take 2,001 mg by mouth See admin instructions. Take 2001mg  (3 capsules) by mouth three times a day with meals and 667mg  (1 capsule) with a snack    [provider]  Calcium Carb-Cholecalciferol (CALCIUM 600 + D PO) Take 1 tablet by mouth daily.    [provider]  cetirizine (ZYRTEC) 10 MG tablet Take 10 mg by mouth daily as needed for allergies.    [provider]  Ensure (ENSURE) Take 237 mLs by mouth 2 (two) times daily between meals.    [provider]  ezetimibe (ZETIA) 10 MG tablet TAKE 1 TABLET BY MOUTH ONCE A DAY Patient taking differently: Take 10 mg by mouth daily. 05/03/21   Cantwell, Celeste C, PA-C  isosorbide mononitrate (IMDUR) 30 MG 24 hr tablet Take 30 mg by mouth daily.    [provider]  labetalol (NORMODYNE) 100 MG tablet Take 100 mg by mouth 2 (two) times daily.    [provider]  LINZESS 72 MCG capsule TAKE 1 CAPSULE BY MOUTH ONCE DAILY BEFORE BREAKFAST Patient taking differently: Take 72 mcg by mouth daily before breakfast. 08/03/21   Levin Erp, PA  megestrol (MEGACE) 40 MG tablet Take 40 mg by mouth daily.    [provider]  mometasone-formoterol (DULERA) 100-5 MCG/ACT AERO Take 2 puffs first thing in am and then another 2 puffs about 12 hours later. Patient taking differently: Inhale 2  puffs into the lungs every 12 (twelve) hours. 04/26/17   Tanda Rockers, MD  multivitamin (RENA-VIT) TABS tablet  Take 1 tablet by mouth at bedtime. 09/21/21   Mercy Riding, MD  nitroGLYCERIN (NITROSTAT) 0.4 MG SL tablet Place 1 tablet (0.4 mg total) under the tongue every 5 (five) minutes as needed for chest pain. 10/15/19   Miquel Dunn, NP  ondansetron (ZOFRAN-ODT) 4 MG disintegrating tablet Take 1 tablet (4 mg total) by mouth every 8 (eight) hours as needed for nausea or vomiting. Patient taking differently: Take 4 mg by mouth daily. Alternate with Promethazine 07/25/21   Hayden Rasmussen, MD  oxyCODONE-acetaminophen (PERCOCET/ROXICET) 5-325 MG tablet Take 1 tablet by mouth 2 (two) times daily as needed for pain. 09/13/21 09/13/22  [provider]  pantoprazole (PROTONIX) 40 MG tablet Take 1 tablet (40 mg total) by mouth daily. 08/22/21   Aline August, MD  polyethylene glycol powder (MIRALAX) 17 GM/SCOOP powder Take 17 g by mouth 2 (two) times daily as needed for moderate constipation or mild constipation. 09/21/21   Mercy Riding, MD  promethazine (PHENERGAN) 25 MG tablet Take 25 mg by mouth daily. Alternate with Ondansetron 08/16/20   [provider]  traZODone (DESYREL) 100 MG tablet Take 50-100 mg by mouth at bedtime as needed for sleep.    [provider]  venlafaxine XR (EFFEXOR XR) 150 MG 24 hr capsule Take 2 capsules (300 mg total) by mouth daily with breakfast. 05/12/21   Arrien, Jimmy Picket, MD  vitamin B-12 (CYANOCOBALAMIN) 500 MCG tablet Take 500 mcg by mouth daily.    [provider]    Allergies    Doxycycline, Hydralazine, Aspirin, Hydrocodone, Ibuprofen, Tylenol [acetaminophen], and Iohexol  Review of Systems   Review of Systems  All other systems reviewed and are negative.  Physical Exam Updated Vital Signs BP (!) 190/86 (BP Location: Right Arm)    Pulse 79    Temp 98.3 F (36.8 C) (Oral)    Resp (!) 24    Ht 1.549 m (5\' 1" )    Wt 42.2 kg    SpO2 95%    BMI 17.57 kg/m   Physical Exam Vitals and nursing note reviewed.   Constitutional:      Appearance: She is well-developed.     Comments: Chronically ill-appearing patient who appears older than her stated age  HENT:     Head: Normocephalic and atraumatic.     Mouth/Throat:     Pharynx: Oropharynx is clear.     Comments: Lips and mucous membranes are dry Eyes:     Pupils: Pupils are equal, round, and reactive to light.  Cardiovascular:     Rate and Rhythm: Normal rate and regular rhythm.  Pulmonary:     Effort: Tachypnea present.     Breath sounds: Examination of the right-lower field reveals decreased breath sounds and rhonchi. Examination of the left-lower field reveals decreased breath sounds and rhonchi. Decreased breath sounds and rhonchi present.  Chest:     Chest wall: No mass or tenderness.     Comments: Dialysis catheter right chest Musculoskeletal:     Cervical back: Normal range of motion.     Right lower leg: Edema present.     Left lower leg: Edema present.     Comments: Trace edema bilateral lower legs Patient with new surgical access left upper extremity  Skin:    General: Skin is warm and dry.  Neurological:     General: No focal  deficit present.     Mental Status: She is alert.  Psychiatric:        Mood and Affect: Mood normal.    ED Results / Procedures / Treatments   Labs (all labs ordered are listed, but only abnormal results are displayed) Labs Reviewed  CBC WITH DIFFERENTIAL/PLATELET - Abnormal; Notable for the following components:      Result Value   WBC 11.0 (*)    RBC 2.98 (*)    Hemoglobin 9.4 (*)    HCT 30.2 (*)    MCV 101.3 (*)    RDW 20.3 (*)    Neutro Abs 9.9 (*)    Lymphs Abs 0.5 (*)    All other components within normal limits  BASIC METABOLIC PANEL - Abnormal; Notable for the following components:   Glucose, Bld 118 (*)    BUN 31 (*)    Creatinine, Ser 3.05 (*)    GFR, Estimated 17 (*)    All other components within normal limits  RESP PANEL BY RT-PCR (FLU A&B, COVID) ARPGX2  D-DIMER,  QUANTITATIVE    EKG EKG Interpretation  Date/Time:  Friday September 29 2021 07:01:10 EST Ventricular Rate:  73 PR Interval:  116 QRS Duration: 82 QT Interval:  396 QTC Calculation: 436 R Axis:   73 Text Interpretation: Normal sinus rhythm Possible Left atrial enlargement Minimal voltage criteria for LVH, may be normal variant Septal infarct , age undetermined Abnormal ECG When compared with ECG of 18-Sep-2021 13:06, PREVIOUS ECG IS PRESENT No significant change since last tracing 09/18/21 Confirmed by Pattricia Boss [phone]) on 09/29/2021 7:12:34 AM  Radiology DG Chest Port 1 View  Result Date: 09/29/2021 CLINICAL DATA:  chest pain, SOB EXAM: PORTABLE CHEST 1 VIEW COMPARISON:  09/18/2021 chest radiograph. FINDINGS: Right internal jugular central venous catheter terminates over the cavoatrial junction. Stable cardiomediastinal silhouette with mild cardiomegaly. No pneumothorax. Small bilateral pleural effusions, left greater than right, stable. Cephalization of the pulmonary vasculature without overt pulmonary edema. Patchy bibasilar lung consolidation with associated volume loss, most prominent at the medial lung bases, unchanged. IMPRESSION: 1. Stable cardiomegaly without overt pulmonary edema. 2. Stable small bilateral pleural effusions, left greater than right. 3. Stable patchy bibasilar lung consolidation with associated volume loss, favor atelectasis, difficult to exclude a component of pneumonia. Electronically Signed   By: Ilona Sorrel M.D.   On: 09/29/2021 08:23    Procedures Procedures   Medications Ordered in ED Medications  methylPREDNISolone sodium succinate (SOLU-MEDROL) 125 mg/2 mL injection 125 mg (has no administration in time range)  cefTRIAXone (ROCEPHIN) 1 g in sodium chloride 0.9 % 100 mL IVPB (has no administration in time range)  azithromycin (ZITHROMAX) 500 mg in sodium chloride 0.9 % 250 mL IVPB (has no administration in time range)  albuterol (PROVENTIL) (2.5  MG/3ML) 0.083% nebulizer solution 5 mg (has no administration in time range)    ED Course  I have reviewed the triage vital signs and the nursing notes.  Pertinent labs & imaging results that were available during my care of the patient were reviewed by me and considered in my medical decision making (see chart for details).    MDM Rules/Calculators/A&P    Medical Decision Making Amount and/or Complexity of Data Reviewed Independent Historian: EMS External Data Reviewed: labs, radiology, ECG and notes. Labs: ordered. Decision-making details documented in ED Course. Radiology: ordered and independent interpretation performed. Decision-making details documented in ED Course. ECG/medicine tests: ordered and independent interpretation performed. Decision-making details documented in ED Course.  Risk Decision regarding hospitalization. Risk Details: 60 year old female with oxygen dependent COPD presents with increasing productive cough and dyspnea. Here patient's saturations are 95% on 2 L of oxygen which is her baseline.  Her respiratory rate is increased to 28% oxygen saturations are 95%. Patient on dialysis and due for dialysis today. X-Lynx Goodrich with some increased densities and effusion compared to 12/19-during the hospitalization she was felt to have exacerbation of her COPD and was treated with course of Zithromax, Solu-Medrol, and was dialyzed with improvement in her condition unclear if this was secondary to infection versus volume overload Patient presents with similar symptoms and dyspnea.  She is tachypneic although not in severe respiratory distress she appeared to be on her last admission.  She will be given broad-spectrum antibiotics, nebulizer, Solu-Medrol, and will need to be dialyzed and further evaluated respiratory status after being dialyzed.   Care discussed with Dr. Lorin Mercy and she will see patient for admission Final Clinical Impression(s) / ED Diagnoses Final diagnoses:   COPD exacerbation ([place])  Hypervolemia, unspecified hypervolemia type    Rx / DC Orders ED Discharge Orders     None        Pattricia Boss, MD 09/29/21 1017

## 2021-09-29 NOTE — ED Notes (Signed)
Xray at the bedside.

## 2021-09-29 NOTE — ED Notes (Signed)
Provided pt ice chips per approval from EDP

## 2021-09-29 NOTE — H&P (Signed)
History and Physical    Patient: [name] WNU:[id number] DOB: 1961/04/27 DOA: 09/29/2021 DOS: the patient was seen and examined on 09/29/2021 PCP: Bernerd Limbo, MD  Patient coming from: Home - lives with significant other and grandson; NOK: Oneal Grout, [phone]   Chief Complaint: SOB  HPI: [name] is a 60 y.o. female with medical history significant of CAD; aortic stenosis s/p distal aortic stent (2019); bipolar/schizophrenia; remote cervical CA; COPD; ESRD on MWF HD; HTN; HLD; IBS; PAD; hypothyroidism; and RLS presenting with SOB.  She was last admitted from 12/19-22 due to acute on chronic respiratory failure from volume overload associated with ESRD, COPD, and anemia.  She got very SOB.  She has been having this issue for a weeks and it really got worse last night.  No significant change in cough, feels like it is caught in her chest.  +CP, more intense than her usual and it won't allow her to breathe.  Significant other was sick prior to onset of symptoms.  +low-grade fevers.  Symptoms are worse with talking and moving around.  +orthopnea.  +PND.  Last HD was Wednesday, denies missed sessions.  Upon further discussion, she developed SOB following initiation of HD.  She has not felt better since starting HD and is miserable all the time.  She is considering stopping HD.  She was given a dose of Ativan in the ER pre-HD after discussion (she wanted to try this to see if it makes HD more tolerable) and she developed agitation and hallucinations.    ER Course:  SOB, quit smoking a month ago.  Recent start of HD, MWF so due today.  New O2 requirement since recent d/c.  Worse last night.  CXR with B LL effusions, infiltrates vs. Edema.  Went home last time with Azithro.  Needs HD today.  Given Albuterol, steroids, abx.  Unvaccinated.   Review of Systems: ROS reviewed and negative except as above Past Medical History:  Diagnosis Date   Aortic stenosis     abominal aorta, s/p distal aortic stent 09/28/18, 12/01/20   Bipolar 1 disorder (HCC)    Blood transfusion without reported diagnosis    Carpal tunnel syndrome    Cataract    forming   Cervical cancer ([place]) 1985   CHF (congestive heart failure) ([place])    Chronic kidney disease (CKD), stage V ([place])    COPD (chronic obstructive pulmonary disease) ([place]) 2000   Coronary artery disease    Diverticulitis 2008   GERD (gastroesophageal reflux disease)    Glaucoma    HLD (hyperlipidemia) 2013   Hypertension 2013   Hypovitaminosis D    IBS (irritable bowel syndrome) 2008   PAD (peripheral artery disease) (HCC)    Pancreatitis 10/2011   RLS (restless legs syndrome)    Schizophrenia ([place])    Skin cancer    Small bowel obstruction ([place]) 2008   Thyroid disease    Past Surgical History:  Procedure Laterality Date   ABDOMINAL ANGIOGRAM N/A 09/07/2014   Procedure: ABDOMINAL ANGIOGRAM;  Surgeon: Laverda Page, MD;  Location: [facility] CATH LAB;  Service: Cardiovascular;  Laterality: N/A;   ABDOMINAL AORTOGRAM W/LOWER EXTREMITY N/A 12/28/2020   Procedure: ABDOMINAL AORTOGRAM W/LOWER EXTREMITY;  Surgeon: Cherre Robins, MD;  Location: [facility] CV LAB;  Service: Cardiovascular;  Laterality: N/A;   APPENDECTOMY  10/01/1994   AV FISTULA PLACEMENT Left 09/14/2021   Procedure: INSERTION OF ARTERIOVENOUS LEFT UPPER GORE-TEX GRAFT ARM;  Surgeon: Cherre Robins, MD;  Location: [facility] OR;  Service: Vascular;  Laterality: Left;   BIOPSY  08/06/2020   Procedure: BIOPSY;  Surgeon: Jackquline Denmark, MD;  Location: [facility] ENDOSCOPY;  Service: Endoscopy;;   COLON SURGERY  10/01/2006   6 inches of colon removed due to obstruction   COLONOSCOPY WITH PROPOFOL N/A 10/25/2016   Procedure: COLONOSCOPY WITH PROPOFOL;  Surgeon: Milus Banister, MD;  Location: [facility] ENDOSCOPY;  Service: Endoscopy;  Laterality: N/A;   COLONOSCOPY WITH PROPOFOL N/A 08/07/2020   Procedure: COLONOSCOPY WITH PROPOFOL;  Surgeon: Jackquline Denmark, MD;   Location: [facility];  Service: Endoscopy;  Laterality: N/A;   COLONOSCOPY WITH PROPOFOL N/A 08/27/2020   Procedure: COLONOSCOPY WITH PROPOFOL;  Surgeon: Lavena Bullion, DO;  Location: [facility];  Service: Gastroenterology;  Laterality: N/A;   CORONARY ANGIOPLASTY WITH STENT PLACEMENT  09/07/2014   "2"   ENTEROSCOPY N/A 08/27/2020   Procedure: ENTEROSCOPY;  Surgeon: Lavena Bullion, DO;  [facility]: [facility];  Service: Gastroenterology;  Laterality: N/A;  Push enteroscopy    ENTEROSCOPY N/A 06/12/2021   Procedure: ENTEROSCOPY;  Surgeon: Rush Landmark Telford Nab., MD;  Location: [facility];  Service: Gastroenterology;  Laterality: N/A;   ENTEROSCOPY N/A 08/18/2021   Procedure: ENTEROSCOPY;  Surgeon: Jerene Bears, MD;  [facility]: [facility];  Service: Gastroenterology;  Laterality: N/A;   ESOPHAGOGASTRODUODENOSCOPY (EGD) WITH PROPOFOL N/A 08/06/2020   Procedure: ESOPHAGOGASTRODUODENOSCOPY (EGD) WITH PROPOFOL;  Surgeon: Jackquline Denmark, MD;  [facility];  Service: Endoscopy;  Laterality: N/A;   GIVENS CAPSULE STUDY N/A 08/24/2020   Procedure: GIVENS CAPSULE STUDY;  Surgeon: Lavena Bullion, DO;  [facility];  Service: Gastroenterology;  Laterality: N/A;   GIVENS CAPSULE STUDY N/A 08/18/2021   Procedure: GIVENS CAPSULE;  Surgeon: Jerene Bears, MD;  [facility];  Service: Gastroenterology;  Laterality: N/A;   HOT HEMOSTASIS N/A 08/07/2020   Procedure: HOT HEMOSTASIS (ARGON PLASMA COAGULATION/BICAP);  Surgeon: Jackquline Denmark, MD;  Location: [facility];  Service: Endoscopy;  Laterality: N/A;   HOT HEMOSTASIS N/A 08/27/2020   Procedure: HOT HEMOSTASIS (ARGON PLASMA COAGULATION/BICAP);  Surgeon: Lavena Bullion, DO;  [facility];  Service: Gastroenterology;  Laterality: N/A;   IR FLUORO GUIDE CV LINE RIGHT  08/21/2021   IR US GUIDE VASC ACCESS RIGHT  08/21/2021   LEFT HEART CATH AND CORONARY ANGIOGRAPHY N/A 03/25/2018   Procedure: LEFT  HEART CATH AND CORONARY ANGIOGRAPHY;  Surgeon: Nigel Mormon, MD;  Location: [facility] CV LAB;  Service: Cardiovascular;  Laterality: N/A;   LEFT HEART CATH AND CORONARY ANGIOGRAPHY N/A 05/09/2021   Procedure: LEFT HEART CATH AND CORONARY ANGIOGRAPHY;  Surgeon: Adrian Prows, MD;  Location: [place] CV LAB;  Service: Cardiovascular;  Laterality: N/A;   LEFT HEART CATHETERIZATION WITH CORONARY ANGIOGRAM N/A 09/07/2014   Procedure: LEFT HEART CATHETERIZATION WITH CORONARY ANGIOGRAM;  Surgeon: Laverda Page, MD;  Location: [facility] CATH LAB;  Service: Cardiovascular;  Laterality: N/A;   LOWER EXTREMITY ANGIOGRAPHY  10/29/2017   Procedure: Lower Extremity Angiography;  Surgeon: Adrian Prows, MD;  Location: [place] CV LAB;  Service: Cardiovascular;;   PERIPHERAL VASCULAR CATHETERIZATION N/A 11/01/2015   Procedure: Renal Angiography;  Surgeon: Adrian Prows, MD;  Location: [place] CV LAB;  Service: Cardiovascular;  Laterality: N/A;   PERIPHERAL VASCULAR CATHETERIZATION N/A 04/10/2016   Procedure: Renal Angiography;  Surgeon: Adrian Prows, MD;  Location: [place] CV LAB;  Service: Cardiovascular;  Laterality: N/A;   PERIPHERAL VASCULAR CATHETERIZATION  04/10/2016   Procedure: Peripheral Vascular Intervention;  Surgeon:  Adrian Prows, MD;  Location: [place] CV LAB;  Service: Cardiovascular;;   PERIPHERAL VASCULAR INTERVENTION  10/29/2017   Procedure: PERIPHERAL VASCULAR INTERVENTION;  Surgeon: Adrian Prows, MD;  Location: [place] CV LAB;  Service: Cardiovascular;;   PERIPHERAL VASCULAR INTERVENTION Bilateral 12/28/2020   Procedure: PERIPHERAL VASCULAR INTERVENTION;  Surgeon: Cherre Robins, MD;  Location: [place] CV LAB;  Service: Cardiovascular;  Laterality: Bilateral;   POLYPECTOMY  08/07/2020   Procedure: POLYPECTOMY;  Surgeon: Jackquline Denmark, MD;  [facility];  Service: Endoscopy;;   POLYPECTOMY  08/27/2020   Procedure: POLYPECTOMY;  Surgeon: Lavena Bullion, DO;   [facility]: [facility];  Service: Gastroenterology;;   POLYPECTOMY     RENAL ANGIOGRAPHY N/A 10/29/2017   Procedure: RENAL ANGIOGRAPHY;  Surgeon: Adrian Prows, MD;  Location: [place] CV LAB;  Service: Cardiovascular;  Laterality: N/A;   RENAL ANGIOGRAPHY N/A 05/10/2020   Procedure: RENAL ANGIOGRAPHY;  Surgeon: Nigel Mormon, MD;  Location: [place] CV LAB;  Service: Cardiovascular;  Laterality: N/A;   RIGHT OOPHORECTOMY Right 10/01/1994   SUBMUCOSAL TATTOO INJECTION  06/12/2021   Procedure: SUBMUCOSAL TATTOO INJECTION;  Surgeon: Irving Copas., MD;  [facility]: [facility];  Service: Gastroenterology;;   TOTAL ABDOMINAL HYSTERECTOMY  10/02/1995   UPPER GASTROINTESTINAL ENDOSCOPY     Social History:  reports that she quit smoking about 8 weeks ago. Her smoking use included cigarettes. She has a 60.00 pack-year smoking history. She has never used smokeless tobacco. She reports current drug use. Frequency: 3.00 times per week. Drug: Marijuana. She reports that she does not drink alcohol.  Allergies  Allergen Reactions   Doxycycline Anaphylaxis and Hives   Hydralazine Shortness Of Breath and Rash    Patient reports "couldn't breath"   Aspirin Other (See Comments)    Internal bleeding- MD SAID to not take this   Hydrocodone Nausea And Vomiting   Ibuprofen Other (See Comments)    Caused internal bleeding   Tylenol [Acetaminophen] Other (See Comments)    MD told the patient to not take this   Iohexol Itching and Other (See Comments)    Pt has itching nose after iv contrast injection    Family History  Problem Relation Age of Onset   Other Mother        many bowel obstructions   Heart disease Mother    Colon polyps Mother    Kidney cancer Father    Bone cancer Father    Diabetes Father    Heart disease Father    Heart disease Brother    Colon cancer Paternal Grandfather    Diabetes Daughter    Esophageal cancer Neg Hx    Rectal cancer Neg Hx    Stomach cancer  Neg Hx     Prior to Admission medications   Medication Sig Start Date End Date Taking? Authorizing Provider  albuterol (VENTOLIN HFA) 108 (90 Base) MCG/ACT inhaler Inhale 2 puffs into the lungs every 6 (six) hours as needed for wheezing or shortness of breath (asthma). 08/22/21   Aline August, MD  ALPRAZolam Duanne Moron) 1 MG tablet Take 1 mg by mouth 2 (two) times daily as needed for anxiety. 04/15/17   [provider]  amLODipine (NORVASC) 10 MG tablet TAKE 1 TABLET BY MOUTH ONCE DAILY Patient taking differently: Take 10 mg by mouth daily. 04/13/21   Cantwell, Celeste C, PA-C  atorvastatin (LIPITOR) 80 MG tablet Take 80 mg by mouth at bedtime.    [provider]  azelastine (ASTELIN)  0.1 % nasal spray Place 1 spray into both nostrils daily as needed for allergies (seasonal allergies). 06/24/19   [provider]  calcium acetate (PHOSLO) 667 MG capsule Take 2,001 mg by mouth See admin instructions. Take 2001mg  (3 capsules) by mouth three times a day with meals and 667mg  (1 capsule) with a snack    [provider]  Calcium Carb-Cholecalciferol (CALCIUM 600 + D PO) Take 1 tablet by mouth daily.    [provider]  cetirizine (ZYRTEC) 10 MG tablet Take 10 mg by mouth daily as needed for allergies.    [provider]  Ensure (ENSURE) Take 237 mLs by mouth 2 (two) times daily between meals.    [provider]  ezetimibe (ZETIA) 10 MG tablet TAKE 1 TABLET BY MOUTH ONCE A DAY Patient taking differently: Take 10 mg by mouth daily. 05/03/21   Cantwell, Celeste C, PA-C  isosorbide mononitrate (IMDUR) 30 MG 24 hr tablet Take 30 mg by mouth daily.    [provider]  labetalol (NORMODYNE) 100 MG tablet Take 100 mg by mouth 2 (two) times daily.    [provider]  LINZESS 72 MCG capsule TAKE 1 CAPSULE BY MOUTH ONCE DAILY BEFORE BREAKFAST Patient taking differently: Take 72 mcg by mouth daily before breakfast. 08/03/21   Levin Erp, PA  megestrol (MEGACE) 40 MG tablet Take 40 mg by mouth daily.    [provider]  mometasone-formoterol (DULERA) 100-5 MCG/ACT AERO Take 2 puffs first thing in am and then another 2 puffs about 12 hours later. Patient taking differently: Inhale 2 puffs into the lungs every 12 (twelve) hours. 04/26/17   Tanda Rockers, MD  multivitamin (RENA-VIT) TABS tablet Take 1 tablet by mouth at bedtime. 09/21/21   Mercy Riding, MD  nitroGLYCERIN (NITROSTAT) 0.4 MG SL tablet Place 1 tablet (0.4 mg total) under the tongue every 5 (five) minutes as needed for chest pain. 10/15/19   Miquel Dunn, NP  ondansetron (ZOFRAN-ODT) 4 MG disintegrating tablet Take 1 tablet (4 mg total) by mouth every 8 (eight) hours as needed for nausea or vomiting. Patient taking differently: Take 4 mg by mouth daily. Alternate with Promethazine 07/25/21   Hayden Rasmussen, MD  oxyCODONE-acetaminophen (PERCOCET/ROXICET) 5-325 MG tablet Take 1 tablet by mouth 2 (two) times daily as needed for pain. 09/13/21 09/13/22  [provider]  pantoprazole (PROTONIX) 40 MG tablet Take 1 tablet (40 mg total) by mouth daily. 08/22/21   Aline August, MD  polyethylene glycol powder (MIRALAX) 17 GM/SCOOP powder Take 17 g by mouth 2 (two) times daily as needed for moderate constipation or mild constipation. 09/21/21   Mercy Riding, MD  promethazine (PHENERGAN) 25 MG tablet Take 25 mg by mouth daily. Alternate with Ondansetron 08/16/20   [provider]  traZODone (DESYREL) 100 MG tablet Take 50-100 mg by mouth at bedtime as needed for sleep.    [provider]  venlafaxine XR (EFFEXOR XR) 150 MG 24 hr capsule Take 2 capsules (300 mg total) by mouth daily with breakfast. 05/12/21   Arrien, Jimmy Picket, MD  vitamin B-12 (CYANOCOBALAMIN) 500 MCG tablet Take 500 mcg by mouth daily.    [provider]    Physical Exam: Vitals:   09/29/21 1315 09/29/21 1330 09/29/21 1415 09/29/21 1419  BP:  (!) 180/99 (!) 152/99 (!) 153/97 (!) 162/99  Pulse:  82 87 84  Resp:  (!) 25 (!) 26 (!) 26  Temp:  98.7 F (37.1 C)  TempSrc:    Axillary  SpO2:  94%  92%  Weight:      Height:       General:  Appears very anxious with tachypnea but normal O2 sats; very frail and appears significantly older than stated age Eyes:  PERRL, EOMI, normal lids, iris ENT:  grossly normal hearing, lips & tongue, mmm Neck:  no LAD, masses or thyromegaly Cardiovascular:  RRR, no m/r/g. No LE edema.  Respiratory:   Diminished breath sounds with bibasilar crackles.  Increased respiratory effort. Abdomen:  soft, NT, ND Skin:  no rash or induration seen on limited exam Musculoskeletal:  grossly normal tone BUE/BLE, good ROM, no bony abnormality Psychiatric:  anxious mood and affect, speech fluent and appropriate, AOx3 Neurologic:  CN 2-12 grossly intact, moves all extremities in coordinated fashion   Radiological Exams on Admission: Independently reviewed - see discussion in A/P where applicable  DG Chest Port 1 View  Result Date: 09/29/2021 CLINICAL DATA:  chest pain, SOB EXAM: PORTABLE CHEST 1 VIEW COMPARISON:  09/18/2021 chest radiograph. FINDINGS: Right internal jugular central venous catheter terminates over the cavoatrial junction. Stable cardiomediastinal silhouette with mild cardiomegaly. No pneumothorax. Small bilateral pleural effusions, left greater than right, stable. Cephalization of the pulmonary vasculature without overt pulmonary edema. Patchy bibasilar lung consolidation with associated volume loss, most prominent at the medial lung bases, unchanged. IMPRESSION: 1. Stable cardiomegaly without overt pulmonary edema. 2. Stable small bilateral pleural effusions, left greater than right. 3. Stable patchy bibasilar lung consolidation with associated volume loss, favor atelectasis, difficult to exclude a component of pneumonia. Electronically Signed   By: Ilona Sorrel M.D.   On: 09/29/2021 08:23    EKG:  Independently reviewed.  NSR with rate 73; nonspecific ST changes with no evidence of acute ischemia, NSCSLT   Labs on Admission: I have personally reviewed the available labs and imaging studies at the time of the admission.  Pertinent labs:    Glucose 118 BUN 31/Creatinine 3.05/GFR 17 WBC 11 Hgb 9.4 COVID/flu negative    Assessment/Plan * Hypervolemia associated with renal insufficiency -Patient recently started HD and has been struggling with sessions -She is not making any urine -For this reason, it appears that she has not been able to effectively volume offload and so is hypervolemic -Respiratory distress appears to be related to this -However, she is also quite anxious and is uncertain about continuing HD (see below) -She is likely to benefit from serial HD potentially both today and tomorrow and may be appropriate for d/c to home tomorrow  -Patient on chronic MWF HD -Nephrology prn order set utilized -Nephrology is aware that patient will need HD -Continue rena-vite, Phoslo  COPD with acute exacerbation ([place])- (present on admission) -She is presenting with significant respiratory distress but she is not currently hypoxic -Suspect significant anxious component (see below) -However, she may also have mild exacerbation -She is continuing to "cheat" and periodically smoke -Continue Dulera -Add Solumedrol -> prednisone -Add Duonebs -Continue prn Albuterol but change to nebs q2h prn -Will add nicotine patch and smoking cessation counseling  Chronic pain disorder- (present on admission) -I have reviewed this patient in the [place] Controlled Substances Reporting System.  She is receiving medications from only one provider and appears to be taking them as prescribed. -She is at high risk of opioid misuse, diversion, or overdose. -Continue Percocet without plan for escalation at this time.  Goals of care, counseling/discussion -She is really unhappy with HD -We discussed the  option of stopping HD and transitioning to Hospice -She is seriously considering this -She was willing to try HD today with Ativan (but had a paradoxical reaction) -Will request palliative care consult for ongoing goals of care discussion, as her grandson will also need to be present for this discussion (her significant other was there today)  Hypothyroidism (acquired)- (present on admission) -She does not appear to be taking medications for this issue -Will check TSH as there does not appear to have been one done since 2018  Dyslipidemia- (present on admission) -Continue Lipitor, Zetia  Affective bipolar disorder ([place])- (present on admission) -She has reported h/o bipolar as well as listed schizophrenia - but is not taking medications for schizophrenia so this diagnosis is suspect -Will continue home Effexor, trazodone, Megace -She was quite anxious today and takes Xanax at home (continued) -She did appear to have a paradoxical response to Ativan and became agitated and confused with hallucinations after receiving it; hopefully this will resolve with HD and would avoid this medication for now -Would use Haldol if needed for agitation or home Xanax  Heart failure with preserved ejection fraction ([place])- (present on admission) -Generally preserved EF on recent myoview and cath -Volume control with HD  Essential hypertension- (present on admission) -Continue Lopressor, Imdur, Norvasc -She was also given PRN IV Metoprolol (reports hydralazine allergy)    Advance Care Planning:   Code Status: DNR   Consults: Nephrology; palliative care; [facility] team; PT/OT  Family Communication: Significant other was present throughout evaluation  Severity of Illness: The appropriate patient status for this patient is OBSERVATION. Observation status is judged to be reasonable and necessary in order to provide the required intensity of service to ensure the patient's safety. [name] presenting  symptoms, physical exam findings, and initial radiographic and laboratory data in the context of their medical condition is felt to place them at decreased risk for further clinical deterioration. Furthermore, it is anticipated that the patient will be medically stable for discharge from the hospital within 2 midnights of admission.   Author: Karmen Bongo 09/29/2021 2:48 PM  For on call review [email or web address].

## 2021-09-29 NOTE — Assessment & Plan Note (Signed)
-  She has reported h/o bipolar as well as listed schizophrenia - but is not taking medications for schizophrenia so this diagnosis is suspect -Will continue home Effexor, trazodone, Megace -She was quite anxious today and takes Xanax at home (continued) -She did appear to have a paradoxical response to Ativan and became agitated and confused with hallucinations after receiving it; hopefully this will resolve with HD and would avoid this medication for now -Would use Haldol if needed for agitation or home Xanax

## 2021-09-29 NOTE — Assessment & Plan Note (Signed)
-  She is presenting with significant respiratory distress but she is not currently hypoxic -Suspect significant anxious component (see below) -However, she may also have mild exacerbation -She is continuing to "cheat" and periodically smoke -Continue Dulera -Add Solumedrol -> prednisone -Add Duonebs -Continue prn Albuterol but change to nebs q2h prn -Will add nicotine patch and smoking cessation counseling

## 2021-09-29 NOTE — ED Triage Notes (Signed)
Patient from home, woke up with increased shortness of breath.  She had wheezing in all fields per EMS.  Patient with sick contacts in the house.  Decreased oxygen sat to 90%.  Patient wears 2L via [place].  Patient with chest pain on and off.  Patient has been given two duonebs, 125mg  solumedrol, 2gm of Magnesium.  Left breath sounds of wheezes still apparent.

## 2021-09-29 NOTE — ED Provider Notes (Signed)
Emergency Medicine Provider Triage Evaluation Note  [name] , a 60 y.o. female  was evaluated in triage.  Pt complains of SOB. Woke up 3am with SOB, sats with fire around 90% on RA.  Wears 2L at baseline.  Some sick contacts with family in home.  Had 2 duonebs, 125mg  solumedrol, 2g Mg+ en route.  States she feels minimally better after treatment.  ESRD on HD (MWF).  Review of Systems  Positive: SOB Negative: fever  Physical Exam  BP (!) 175/82    Pulse 72    Temp 98.3 F (36.8 C) (Oral)    Resp 16    SpO2 98%   Gen:   Awake, no distress   Resp:  Increased WOB, scattered rhonchi and wheezes, on neb in triage, sats 98% on neb MSK:   Moves extremities without difficulty  Other:    Medical Decision Making  Medically screening exam initiated at 6:48 AM.  Appropriate orders placed.  [name] was informed that the remainder of the evaluation will be completed by another provider, this initial triage assessment does not replace that evaluation, and the importance of remaining in the ED until their evaluation is complete.  SOB.  ESRD on HD.  Also with recent sick contacts at home.  VSS in triage.  EKG, labs, CXR, covid screen.  Made acuity 2, charge RN aware.  Will prioritize room assignment.   Cheyenne Pickett, PA-C 09/29/21 1683    Cheyenne Pew, MD 09/29/21 [phone]

## 2021-09-29 NOTE — ED Notes (Signed)
Pt is mumbling and giggling. RN asked pt dob but pt only mumbles. She is not giving clear statements as before. Notified Karmen Bongo, MD to assess pt at bedside.

## 2021-09-29 NOTE — Assessment & Plan Note (Signed)
-  Continue Lopressor, Imdur, Norvasc -She was also given PRN IV Metoprolol (reports hydralazine allergy)

## 2021-09-29 NOTE — ED Notes (Signed)
Pt placed back at 2L baseline O2 94%

## 2021-09-29 NOTE — ED Notes (Signed)
RN entered room pt was bent over in bed stating she can't do it and she can't breathe. Pt was becoming labored at 24 and exhibiting to be anxious. Pt HOB was raised to 90 degrees, nasal canula switched, and O2 increased from 2L to 5L. RN encouraged pt to take deep breaths through nose. Pt appears to be calm. VS 5L 94% RR24

## 2021-09-29 NOTE — Progress Notes (Signed)
OT Cancellation Note  Patient Details Name: YEIMI DEBNAM MRN: [id number] DOB: [birth year]   Cancelled Treatment:    Reason Eval/Treat Not Completed: Patient at procedure or test/ unavailable (HD)  Jeri Modena 09/29/2021, 2:21 PM  Fleeta Emmer, OTR/L  Acute Rehabilitation Services Pager: [phone] Office: [phone] .

## 2021-09-29 NOTE — Assessment & Plan Note (Signed)
-  She does not appear to be taking medications for this issue -Will check TSH as there does not appear to have been one done since 2018

## 2021-09-29 NOTE — ED Notes (Addendum)
Pt states she is normally on 2L of O2 at home. She uses a walker or cane but she should be getting a wheelchair soon. Pt endorses being fatigue when walking.

## 2021-09-29 NOTE — Assessment & Plan Note (Signed)
-  Continue Lipitor, Zetia ?

## 2021-09-29 NOTE — Assessment & Plan Note (Signed)
-  She is really unhappy with HD -We discussed the option of stopping HD and transitioning to Hospice -She is seriously considering this -She was willing to try HD today with Ativan (but had a paradoxical reaction) -Will request palliative care consult for ongoing goals of care discussion, as her grandson will also need to be present for this discussion (her significant other was there today)

## 2021-09-29 NOTE — Assessment & Plan Note (Signed)
-  I have reviewed this patient in the [place] Controlled Substances Reporting System.  She is receiving medications from only one provider and appears to be taking them as prescribed. -She is at high risk of opioid misuse, diversion, or overdose. -Continue Percocet without plan for escalation at this time.

## 2021-09-29 NOTE — Progress Notes (Signed)
Pt receives out-pt HD at [facility] on MWF. Pt arrives at 11:30 for 11:50 chair time. Will assist as needed.  Melven Sartorius Renal Navigator [phone]

## 2021-09-29 NOTE — Consult Note (Signed)
[facility] Renal Consultation Note    Indication for Consultation:  Management of ESRD/hemodialysis, anemia, hypertension/volume, and secondary hyperparathyroidism.  HPI: [name] is a 60 y.o. female. Patient is a 60 year old female with past medical history including CAD, aortic stenosis, COPD, hypertension, hyperlipidemia, and hypothyroidism, who recently started dialysis on Monday Wednesday Friday schedule.  Patient presented to the ED today with shortness of breath.  She was recently admitted from 09/18/2021 to 09/21/2021 with shortness of breath, abdominal pain, and hypoxia.  Respiratory status improved with hemodialysis, treatment of COPD exacerbation, and blood transfusion.  On presentation to the ED today, patient was tachypneic and notably hypertensive.  Hemoglobin 9.4.  K4.0, BUN 31, creatinine 3.05.  Chest x-ray showed stable cardiomegaly without overt pulmonary edema, small bilateral pleural effusions, and stable patchy bibasilar lung consolidation favoring atelectasis.  Patient was given albuterol and steroids, however shortness of breath failed to improve.  Nephrology has been consulted for dialysis.  At present, patient reports shortness of breath, denies chest pain, palpitations, dizziness, abdominal pain, nausea, vomiting, diarrhea.  No fevers or chills.  Reports she has had all her dialysis sessions.  Past Medical History:  Diagnosis Date   Aortic stenosis    abominal aorta, s/p distal aortic stent 09/28/18, 12/01/20   Bipolar 1 disorder (HCC)    Blood transfusion without reported diagnosis    Carpal tunnel syndrome    Cataract    forming   Cervical cancer ([place]) 1985   CHF (congestive heart failure) ([place])    Chronic kidney disease (CKD), stage V ([place])    COPD (chronic obstructive pulmonary disease) ([place]) 2000   Coronary artery disease    Diverticulitis 2008   GERD (gastroesophageal reflux disease)    Glaucoma    HLD (hyperlipidemia) 2013    Hypertension 2013   Hypovitaminosis D    IBS (irritable bowel syndrome) 2008   PAD (peripheral artery disease) (HCC)    Pancreatitis 10/2011   RLS (restless legs syndrome)    Schizophrenia ([place])    Skin cancer    Small bowel obstruction ([place]) 2008   Thyroid disease    Past Surgical History:  Procedure Laterality Date   ABDOMINAL ANGIOGRAM N/A 09/07/2014   Procedure: ABDOMINAL ANGIOGRAM;  Surgeon: Laverda Page, MD;  Location: [facility] CATH LAB;  Service: Cardiovascular;  Laterality: N/A;   ABDOMINAL AORTOGRAM W/LOWER EXTREMITY N/A 12/28/2020   Procedure: ABDOMINAL AORTOGRAM W/LOWER EXTREMITY;  Surgeon: Cherre Robins, MD;  Location: [place] CV LAB;  Service: Cardiovascular;  Laterality: N/A;   APPENDECTOMY  10/01/1994   AV FISTULA PLACEMENT Left 09/14/2021   Procedure: INSERTION OF ARTERIOVENOUS LEFT UPPER GORE-TEX GRAFT ARM;  Surgeon: Cherre Robins, MD;  Location: [facility] OR;  Service: Vascular;  Laterality: Left;   BIOPSY  08/06/2020   Procedure: BIOPSY;  Surgeon: Jackquline Denmark, MD;  Location: [facility] ENDOSCOPY;  Service: Endoscopy;;   COLON SURGERY  10/01/2006   6 inches of colon removed due to obstruction   COLONOSCOPY WITH PROPOFOL N/A 10/25/2016   Procedure: COLONOSCOPY WITH PROPOFOL;  Surgeon: Milus Banister, MD;  Location: [facility] ENDOSCOPY;  Service: Endoscopy;  Laterality: N/A;   COLONOSCOPY WITH PROPOFOL N/A 08/07/2020   Procedure: COLONOSCOPY WITH PROPOFOL;  Surgeon: Jackquline Denmark, MD;  Location: [facility];  Service: Endoscopy;  Laterality: N/A;   COLONOSCOPY WITH PROPOFOL N/A 08/27/2020   Procedure: COLONOSCOPY WITH PROPOFOL;  Surgeon: Lavena Bullion, DO;  Location: [facility] ENDOS[facility];  Service: Gastroenterology;  Laterality: N/A;   CORONARY ANGIOPLASTY  WITH STENT PLACEMENT  09/07/2014   "2"   ENTEROSCOPY N/A 08/27/2020   Procedure: ENTEROSCOPY;  Surgeon: Lavena Bullion, DO;  Location: [facility];  Service: Gastroenterology;  Laterality: N/A;  Push enteroscopy     ENTEROSCOPY N/A 06/12/2021   Procedure: ENTEROSCOPY;  Surgeon: Rush Landmark Telford Nab., MD;  Location: [facility];  Service: Gastroenterology;  Laterality: N/A;   ENTEROSCOPY N/A 08/18/2021   Procedure: ENTEROSCOPY;  Surgeon: Jerene Bears, MD;  [facility]: [facility];  Service: Gastroenterology;  Laterality: N/A;   ESOPHAGOGASTRODUODENOSCOPY (EGD) WITH PROPOFOL N/A 08/06/2020   Procedure: ESOPHAGOGASTRODUODENOSCOPY (EGD) WITH PROPOFOL;  Surgeon: Jackquline Denmark, MD;  [facility];  Service: Endoscopy;  Laterality: N/A;   GIVENS CAPSULE STUDY N/A 08/24/2020   Procedure: GIVENS CAPSULE STUDY;  Surgeon: Lavena Bullion, DO;  [facility]: [facility];  Service: Gastroenterology;  Laterality: N/A;   GIVENS CAPSULE STUDY N/A 08/18/2021   Procedure: GIVENS CAPSULE;  Surgeon: Jerene Bears, MD;  [facility]: [facility];  Service: Gastroenterology;  Laterality: N/A;   HOT HEMOSTASIS N/A 08/07/2020   Procedure: HOT HEMOSTASIS (ARGON PLASMA COAGULATION/BICAP);  Surgeon: Jackquline Denmark, MD;  Location: [facility];  Service: Endoscopy;  Laterality: N/A;   HOT HEMOSTASIS N/A 08/27/2020   Procedure: HOT HEMOSTASIS (ARGON PLASMA COAGULATION/BICAP);  Surgeon: Lavena Bullion, DO;  [facility];  Service: Gastroenterology;  Laterality: N/A;   IR FLUORO GUIDE CV LINE RIGHT  08/21/2021   IR US GUIDE VASC ACCESS RIGHT  08/21/2021   LEFT HEART CATH AND CORONARY ANGIOGRAPHY N/A 03/25/2018   Procedure: LEFT HEART CATH AND CORONARY ANGIOGRAPHY;  Surgeon: Nigel Mormon, MD;  Location: [place] [facility] LAB;  Service: Cardiovascular;  Laterality: N/A;   LEFT HEART CATH AND CORONARY ANGIOGRAPHY N/A 05/09/2021   Procedure: LEFT HEART CATH AND CORONARY ANGIOGRAPHY;  Surgeon: Adrian Prows, MD;  Location: [place] CV LAB;  Service: Cardiovascular;  Laterality: N/A;   LEFT HEART CATHETERIZATION WITH CORONARY ANGIOGRAM N/A 09/07/2014   Procedure: LEFT HEART CATHETERIZATION WITH CORONARY ANGIOGRAM;   Surgeon: Laverda Page, MD;  Location: [facility] CATH LAB;  Service: Cardiovascular;  Laterality: N/A;   LOWER EXTREMITY ANGIOGRAPHY  10/29/2017   Procedure: Lower Extremity Angiography;  Surgeon: Adrian Prows, MD;  Location: [place] CV LAB;  Service: Cardiovascular;;   PERIPHERAL VASCULAR CATHETERIZATION N/A 11/01/2015   Procedure: Renal Angiography;  Surgeon: Adrian Prows, MD;  Location: [place] [facility] LAB;  Service: Cardiovascular;  Laterality: N/A;   PERIPHERAL VASCULAR CATHETERIZATION N/A 04/10/2016   Procedure: Renal Angiography;  Surgeon: Adrian Prows, MD;  Location: [place] CV LAB;  Service: Cardiovascular;  Laterality: N/A;   PERIPHERAL VASCULAR CATHETERIZATION  04/10/2016   Procedure: Peripheral Vascular Intervention;  Surgeon: Adrian Prows, MD;  Location: [place] CV LAB;  Service: Cardiovascular;;   PERIPHERAL VASCULAR INTERVENTION  10/29/2017   Procedure: PERIPHERAL VASCULAR INTERVENTION;  Surgeon: Adrian Prows, MD;  Location: [place] CV LAB;  Service: Cardiovascular;;   PERIPHERAL VASCULAR INTERVENTION Bilateral 12/28/2020   Procedure: PERIPHERAL VASCULAR INTERVENTION;  Surgeon: Cherre Robins, MD;  Location: [place] [facility] LAB;  Service: Cardiovascular;  Laterality: Bilateral;   POLYPECTOMY  08/07/2020   Procedure: POLYPECTOMY;  Surgeon: Jackquline Denmark, MD;  [facility];  Service: Endoscopy;;   POLYPECTOMY  08/27/2020   Procedure: POLYPECTOMY;  Surgeon: Lavena Bullion, DO;  [facility]: [facility];  Service: Gastroenterology;;   POLYPECTOMY     RENAL ANGIOGRAPHY N/A 10/29/2017   Procedure: RENAL ANGIOGRAPHY;  Surgeon: Adrian Prows, MD;  Location:  [facility];  Service: Cardiovascular;  Laterality: N/A;   RENAL ANGIOGRAPHY N/A 05/10/2020   Procedure: RENAL ANGIOGRAPHY;  Surgeon: Nigel Mormon, MD;  Location: [place] CV LAB;  Service: Cardiovascular;  Laterality: N/A;   RIGHT OOPHORECTOMY Right 10/01/1994   SUBMUCOSAL TATTOO INJECTION  06/12/2021    Procedure: SUBMUCOSAL TATTOO INJECTION;  Surgeon: Irving Copas., MD;  [facility]: [facility];  Service: Gastroenterology;;   TOTAL ABDOMINAL HYSTERECTOMY  10/02/1995   UPPER GASTROINTESTINAL ENDOSCOPY     Family History  Problem Relation Age of Onset   Other Mother        many bowel obstructions   Heart disease Mother    Colon polyps Mother    Kidney cancer Father    Bone cancer Father    Diabetes Father    Heart disease Father    Heart disease Brother    Colon cancer Paternal Grandfather    Diabetes Daughter    Esophageal cancer Neg Hx    Rectal cancer Neg Hx    Stomach cancer Neg Hx    Social History:  reports that she quit smoking about 8 weeks ago. Her smoking use included cigarettes. She has a 60.00 pack-year smoking history. She has never used smokeless tobacco. She reports current drug use. Frequency: 3.00 times per week. Drug: Marijuana. She reports that she does not drink alcohol.  ROS: As per HPI otherwise negative.  Physical Exam: Vitals:   09/29/21 0930 09/29/21 1000 09/29/21 1015 09/29/21 1045  BP: (!) 185/84 (!) 181/91 (!) 192/94 (!) 207/106  Pulse: 77 79 80 99  Resp: (!) 21 (!) 26 (!) 26 (!) 22  Temp:      TempSrc:      SpO2: 95% 90% 93% 95%  Weight:      Height:         General: Patient sitting upright in tripod position, increased work of breathing, alert and oriented x3 HEENT: Normocephalic/atraumatic Cardiac: Regular rate and rhythm, no murmurs, rubs, or gallops. Pulm: On O2 via nasal cannula, tachypneic with increased work of breathing, poor air movement with crackles in bilateral bases GI: Abdomen soft, nondistended, positive bowel sounds Extremities: Trace pitting edema bilateral lower extremities Dialysis Access: AVG right upper extremity with positive bruit, right IJ TDC with clean intact bandage  Allergies  Allergen Reactions   Doxycycline Anaphylaxis and Hives   Hydralazine Shortness Of Breath and Rash    Patient reports "couldn't  breath"   Aspirin Other (See Comments)    Internal bleeding- MD SAID to not take this   Hydrocodone Nausea And Vomiting   Ibuprofen Other (See Comments)    Caused internal bleeding   Tylenol [Acetaminophen] Other (See Comments)    MD told the patient to not take this   Iohexol Itching and Other (See Comments)    Pt has itching nose after iv contrast injection   Prior to Admission medications   Medication Sig Start Date End Date Taking? Authorizing Provider  albuterol (VENTOLIN HFA) 108 (90 Base) MCG/ACT inhaler Inhale 2 puffs into the lungs every 6 (six) hours as needed for wheezing or shortness of breath (asthma). 08/22/21   Aline August, MD  ALPRAZolam Duanne Moron) 1 MG tablet Take 1 mg by mouth 2 (two) times daily as needed for anxiety. 04/15/17   [provider]  amLODipine (NORVASC) 10 MG tablet TAKE 1 TABLET BY MOUTH ONCE DAILY Patient taking differently: Take 10 mg by mouth daily. 04/13/21   Cantwell, Celeste C, PA-C  atorvastatin (  LIPITOR) 80 MG tablet Take 80 mg by mouth at bedtime.    [provider]  azelastine (ASTELIN) 0.1 % nasal spray Place 1 spray into both nostrils daily as needed for allergies (seasonal allergies). 06/24/19   [provider]  calcium acetate (PHOSLO) 667 MG capsule Take 2,001 mg by mouth See admin instructions. Take 2001mg  (3 capsules) by mouth three times a day with meals and 667mg  (1 capsule) with a snack    [provider]  Calcium Carb-Cholecalciferol (CALCIUM 600 + D PO) Take 1 tablet by mouth daily.    [provider]  cetirizine (ZYRTEC) 10 MG tablet Take 10 mg by mouth daily as needed for allergies.    [provider]  Ensure (ENSURE) Take 237 mLs by mouth 2 (two) times daily between meals.    [provider]  ezetimibe (ZETIA) 10 MG tablet TAKE 1 TABLET BY MOUTH ONCE A DAY Patient taking differently: Take 10 mg by mouth daily. 05/03/21   Cantwell, Celeste C, PA-C  isosorbide mononitrate (IMDUR)  30 MG 24 hr tablet Take 30 mg by mouth daily.    [provider]  labetalol (NORMODYNE) 100 MG tablet Take 100 mg by mouth 2 (two) times daily.    [provider]  LINZESS 72 MCG capsule TAKE 1 CAPSULE BY MOUTH ONCE DAILY BEFORE BREAKFAST Patient taking differently: Take 72 mcg by mouth daily before breakfast. 08/03/21   Levin Erp, PA  megestrol (MEGACE) 40 MG tablet Take 40 mg by mouth daily.    [provider]  mometasone-formoterol (DULERA) 100-5 MCG/ACT AERO Take 2 puffs first thing in am and then another 2 puffs about 12 hours later. Patient taking differently: Inhale 2 puffs into the lungs every 12 (twelve) hours. 04/26/17   Tanda Rockers, MD  multivitamin (RENA-VIT) TABS tablet Take 1 tablet by mouth at bedtime. 09/21/21   Mercy Riding, MD  nitroGLYCERIN (NITROSTAT) 0.4 MG SL tablet Place 1 tablet (0.4 mg total) under the tongue every 5 (five) minutes as needed for chest pain. 10/15/19   Miquel Dunn, NP  ondansetron (ZOFRAN-ODT) 4 MG disintegrating tablet Take 1 tablet (4 mg total) by mouth every 8 (eight) hours as needed for nausea or vomiting. Patient taking differently: Take 4 mg by mouth daily. Alternate with Promethazine 07/25/21   Hayden Rasmussen, MD  oxyCODONE-acetaminophen (PERCOCET/ROXICET) 5-325 MG tablet Take 1 tablet by mouth 2 (two) times daily as needed for pain. 09/13/21 09/13/22  [provider]  pantoprazole (PROTONIX) 40 MG tablet Take 1 tablet (40 mg total) by mouth daily. 08/22/21   Aline August, MD  polyethylene glycol powder (MIRALAX) 17 GM/SCOOP powder Take 17 g by mouth 2 (two) times daily as needed for moderate constipation or mild constipation. 09/21/21   Mercy Riding, MD  promethazine (PHENERGAN) 25 MG tablet Take 25 mg by mouth daily. Alternate with Ondansetron 08/16/20   [provider]  traZODone (DESYREL) 100 MG tablet Take 50-100 mg by mouth at bedtime as needed for sleep.    [provider]  venlafaxine XR (EFFEXOR XR) 150 MG 24 hr capsule Take 2 capsules (300 mg total) by mouth daily with breakfast. 05/12/21   Arrien, Jimmy Picket, MD  vitamin B-12 (CYANOCOBALAMIN) 500 MCG tablet Take 500 mcg by mouth daily.    [provider]   Current Facility-Administered Medications  Medication Dose Route Frequency Provider Last Rate Last Admin   azithromycin (ZITHROMAX) 500 mg in sodium chloride 0.9 %  250 mL IVPB  500 mg Intravenous Once Pattricia Boss, MD 250 mL/hr at 09/29/21 1105 500 mg at 09/29/21 1105   Chlorhexidine Gluconate Cloth 2 % PADS 6 each  6 each Topical Q0600 Janalee Dane, PA-C       LORazepam (ATIVAN) injection 1 mg  1 mg Intravenous Once Karmen Bongo, MD       metoprolol tartrate (LOPRESSOR) injection 5 mg  5 mg Intravenous Q6H PRN Karmen Bongo, MD       Current Outpatient Medications  Medication Sig Dispense Refill   albuterol (VENTOLIN HFA) 108 (90 Base) MCG/ACT inhaler Inhale 2 puffs into the lungs every 6 (six) hours as needed for wheezing or shortness of breath (asthma). 8 g 0   ALPRAZolam (XANAX) 1 MG tablet Take 1 mg by mouth 2 (two) times daily as needed for anxiety.     amLODipine (NORVASC) 10 MG tablet TAKE 1 TABLET BY MOUTH ONCE DAILY (Patient taking differently: Take 10 mg by mouth daily.) 90 tablet 1   atorvastatin (LIPITOR) 80 MG tablet Take 80 mg by mouth at bedtime.     azelastine (ASTELIN) 0.1 % nasal spray Place 1 spray into both nostrils daily as needed for allergies (seasonal allergies).     calcium acetate (PHOSLO) 667 MG capsule Take 2,001 mg by mouth See admin instructions. Take 2001mg  (3 capsules) by mouth three times a day with meals and 667mg  (1 capsule) with a snack     Calcium Carb-Cholecalciferol (CALCIUM 600 + D PO) Take 1 tablet by mouth daily.     cetirizine (ZYRTEC) 10 MG tablet Take 10 mg by mouth daily as needed for allergies.     Ensure (ENSURE) Take 237 mLs by mouth 2 (two) times daily between meals.      ezetimibe (ZETIA) 10 MG tablet TAKE 1 TABLET BY MOUTH ONCE A DAY (Patient taking differently: Take 10 mg by mouth daily.) 90 tablet 3   isosorbide mononitrate (IMDUR) 30 MG 24 hr tablet Take 30 mg by mouth daily.     labetalol (NORMODYNE) 100 MG tablet Take 100 mg by mouth 2 (two) times daily.     LINZESS 72 MCG capsule TAKE 1 CAPSULE BY MOUTH ONCE DAILY BEFORE BREAKFAST (Patient taking differently: Take 72 mcg by mouth daily before breakfast.) 30 capsule 2   megestrol (MEGACE) 40 MG tablet Take 40 mg by mouth daily.     mometasone-formoterol (DULERA) 100-5 MCG/ACT AERO Take 2 puffs first thing in am and then another 2 puffs about 12 hours later. (Patient taking differently: Inhale 2 puffs into the lungs every 12 (twelve) hours.) 1 Inhaler 11   multivitamin (RENA-VIT) TABS tablet Take 1 tablet by mouth at bedtime.  0   nitroGLYCERIN (NITROSTAT) 0.4 MG SL tablet Place 1 tablet (0.4 mg total) under the tongue every 5 (five) minutes as needed for chest pain. 30 tablet 1   ondansetron (ZOFRAN-ODT) 4 MG disintegrating tablet Take 1 tablet (4 mg total) by mouth every 8 (eight) hours as needed for nausea or vomiting. (Patient taking differently: Take 4 mg by mouth daily. Alternate with Promethazine) 20 tablet 0   oxyCODONE-acetaminophen (PERCOCET/ROXICET) 5-325 MG tablet Take 1 tablet by mouth 2 (two) times daily as needed for pain.     pantoprazole (PROTONIX) 40 MG tablet Take 1 tablet (40 mg total) by mouth daily.     polyethylene glycol powder (MIRALAX) 17 GM/SCOOP powder Take 17 g by mouth 2 (two) times daily as needed for moderate constipation or mild  constipation. 255 g 0   promethazine (PHENERGAN) 25 MG tablet Take 25 mg by mouth daily. Alternate with Ondansetron     traZODone (DESYREL) 100 MG tablet Take 50-100 mg by mouth at bedtime as needed for sleep.     venlafaxine XR (EFFEXOR XR) 150 MG 24 hr capsule Take 2 capsules (300 mg total) by mouth daily with breakfast.     vitamin B-12  (CYANOCOBALAMIN) 500 MCG tablet Take 500 mcg by mouth daily.     Labs: Basic Metabolic Panel: Recent Labs  Lab 09/29/21 0732  NA 138  K 4.0  CL 101  CO2 23  GLUCOSE 118*  BUN 31*  CREATININE 3.05*  CALCIUM 9.0    Recent Labs  Lab 09/29/21 0732  WBC 11.0*  NEUTROABS 9.9*  HGB 9.4*  HCT 30.2*  MCV 101.3*  PLT 295    Studies/Results: DG Chest Port 1 View  Result Date: 09/29/2021 CLINICAL DATA:  chest pain, SOB EXAM: PORTABLE CHEST 1 VIEW COMPARISON:  09/18/2021 chest radiograph. FINDINGS: Right internal jugular central venous catheter terminates over the cavoatrial junction. Stable cardiomediastinal silhouette with mild cardiomegaly. No pneumothorax. Small bilateral pleural effusions, left greater than right, stable. Cephalization of the pulmonary vasculature without overt pulmonary edema. Patchy bibasilar lung consolidation with associated volume loss, most prominent at the medial lung bases, unchanged. IMPRESSION: 1. Stable cardiomegaly without overt pulmonary edema. 2. Stable small bilateral pleural effusions, left greater than right. 3. Stable patchy bibasilar lung consolidation with associated volume loss, favor atelectasis, difficult to exclude a component of pneumonia. Electronically Signed   By: Ilona Sorrel M.D.   On: 09/29/2021 08:23    Dialysis Orders:  Outpatient HD:  [facility] MWF 180NRe 3hr 74min EDW 40.8kg, 3K, 2.5Ca, TDC, no heparin Venofer 100mg  IV q HD 12/30-1/20  Assessment/Plan: Acute on chronic respiratory failure: Recent admission for volume overload and COPD exacerbation.  Does appear to have volume overload again today.  We will plan for urgent dialysis today, treatment will be abbreviated due to high census/staffing issues.  HTN/volume: Notably hypertensive and appears volume overloaded, planned for HD today as above. Continue amlodipine 10mg  dialy.  Anemia; Not on an ESA. Hgb 9.4. Will start ESA with next HD if hemoglobin remains below goal. Will wait to r/o  infection before starting course of venofer.  Secondary hyperparathyroidism: Corrected calcium slightly high, not on VDRA. Last phos 3.3- continue calcium acetate once tolerating PO Nutrition: Will need renal diet/fluid restriction. Albumin pending.  COPD: With recurrent exacerbations. See #1 above. Per primary team.   Anice Paganini, PA-C 09/29/2021, 11:51 AM  [facility] Pager: [phone]

## 2021-09-29 NOTE — Procedures (Signed)
° °  I was present at this dialysis session, have reviewed the session itself and made  appropriate changes Kelly Splinter MD [place] pager [phone]   09/29/2021, 3:39 PM

## 2021-09-29 NOTE — Assessment & Plan Note (Signed)
-  Generally preserved EF on recent myoview and cath -Volume control with HD

## 2021-09-29 NOTE — Assessment & Plan Note (Addendum)
-  Patient recently started HD and has been struggling with sessions -She is not making any urine -For this reason, it appears that she has not been able to effectively volume offload and so is hypervolemic -Respiratory distress appears to be related to this -However, she is also quite anxious and is uncertain about continuing HD (see below) -She is likely to benefit from serial HD potentially both today and tomorrow and may be appropriate for d/c to home tomorrow  -Patient on chronic MWF HD -Nephrology prn order set utilized -Nephrology is aware that patient will need HD -Continue rena-vite, Phoslo

## 2021-09-30 DIAGNOSIS — E8770 Fluid overload, unspecified: Secondary | ICD-10-CM | POA: Diagnosis not present

## 2021-09-30 DIAGNOSIS — J441 Chronic obstructive pulmonary disease with (acute) exacerbation: Secondary | ICD-10-CM

## 2021-09-30 DIAGNOSIS — N186 End stage renal disease: Secondary | ICD-10-CM | POA: Diagnosis not present

## 2021-09-30 DIAGNOSIS — Z992 Dependence on renal dialysis: Secondary | ICD-10-CM

## 2021-09-30 DIAGNOSIS — J810 Acute pulmonary edema: Secondary | ICD-10-CM | POA: Diagnosis not present

## 2021-09-30 DIAGNOSIS — N289 Disorder of kidney and ureter, unspecified: Secondary | ICD-10-CM

## 2021-09-30 DIAGNOSIS — Z515 Encounter for palliative care: Secondary | ICD-10-CM | POA: Diagnosis not present

## 2021-09-30 DIAGNOSIS — I161 Hypertensive emergency: Secondary | ICD-10-CM | POA: Diagnosis not present

## 2021-09-30 LAB — BASIC METABOLIC PANEL
Anion gap: 13 (ref 5–15)
BUN: 23 mg/dL — ABNORMAL HIGH (ref 6–20)
CO2: 25 mmol/L (ref 22–32)
Calcium: 8.7 mg/dL — ABNORMAL LOW (ref 8.9–10.3)
Chloride: 96 mmol/L — ABNORMAL LOW (ref 98–111)
Creatinine, Ser: 2.27 mg/dL — ABNORMAL HIGH (ref 0.44–1.00)
GFR, Estimated: 24 mL/min — ABNORMAL LOW (ref >60)
Glucose, Bld: 103 mg/dL — ABNORMAL HIGH (ref 70–99)
Potassium: 3.6 mmol/L (ref 3.5–5.1)
Sodium: 134 mmol/L — ABNORMAL LOW (ref 135–145)

## 2021-09-30 LAB — CBC
HCT: 27.6 % — ABNORMAL LOW (ref 36.0–46.0)
Hemoglobin: 8.7 g/dL — ABNORMAL LOW (ref 12.0–15.0)
MCH: 30.9 pg (ref 26.0–34.0)
MCHC: 31.5 g/dL (ref 30.0–36.0)
MCV: 97.9 fL (ref 80.0–100.0)
Platelets: 225 10*3/uL (ref 150–400)
RBC: 2.82 MIL/uL — ABNORMAL LOW (ref 3.87–5.11)
RDW: 20.1 % — ABNORMAL HIGH (ref 11.5–15.5)
WBC: 6.1 10*3/uL (ref 4.0–10.5)
nRBC: 0 % (ref 0.0–0.2)

## 2021-09-30 MED ORDER — SODIUM CHLORIDE 0.9 % IV SOLN: 125.0000 mg | INTRAVENOUS | Status: DC

## 2021-09-30 NOTE — Evaluation (Signed)
Occupational Therapy Evaluation Patient Details Name: [name] MRN: [id number] DOB: October 01, 1961 Today's Date: 09/30/2021   History of Present Illness Pt is a 60 yr old female who presented due to SOB.  Pt had a recent admission due to acute on chronic respiratory failure from volume overload associated with ESRD, COPD, and anemia. Pt reported they were not always using oxygen at home as they were not sure on how to use. PMH includes: CKD stage V recently started HD, COPD Gold stage III noncompliant with home oxygen, IBS with chronic diarrhea, HTN, chronic diastolic CHF, CAD, PVD, s/p  bilateral common iliac stenting, severe protein calorie malnutrition, chronic anemia secondary to CKD   Clinical Impression   Pt was able to complete bed mobility with supervision, sit to stand with supervision and was limited in mobility due to fatigue and feeling SOB when on 2L via [place] but was able to remain in 90%. Pt noted when attempting to complete ADLS in standing with more then 1-2 min started to postural sway and required min guard. Pt in session was educated on how to use how to use an oxygen tank and line management with ADLS. Pt currently with functional limitations due to the deficits listed below (see OT Problem List).  Pt will benefit from skilled OT to increase their safety and independence with ADL and functional mobility for ADL to facilitate discharge to venue listed below.        Recommendations for follow up therapy are one component of a multi-disciplinary discharge planning process, led by the attending physician.  Recommendations may be updated based on patient status, additional functional criteria and insurance authorization.   Follow Up Recommendations  Skilled nursing-short term rehab (<3 hours/day)    Assistance Recommended at Discharge Intermittent Supervision/Assistance  Functional Status Assessment  Patient has had a recent decline in their functional status and demonstrates  the ability to make significant improvements in function in a reasonable and predictable amount of time.  Equipment Recommendations       Recommendations for Other Services       Precautions / Restrictions Precautions Precautions: Fall Precaution Comments: watch O2 Restrictions Weight Bearing Restrictions: No      Mobility Bed Mobility Overal bed mobility: Modified Independent             General bed mobility comments: increased time, but no physical assist.    Transfers Overall transfer level: Needs assistance Equipment used: Rolling walker (2 wheels) Transfers: Sit to/from Stand Sit to Stand: Supervision                  Balance Overall balance assessment: Needs assistance;History of Falls Sitting-balance support: Feet supported;No upper extremity supported Sitting balance-Leahy Scale: Good     Standing balance support: Bilateral upper extremity supported;During functional activity Standing balance-Leahy Scale: Fair Standing balance comment: pt with more then 1-2 mins started to become to decrease stability on BLE                           ADL either performed or assessed with clinical judgement   ADL Overall ADL's : Needs assistance/impaired Eating/Feeding: Independent;Sitting   Grooming: Wash/dry hands;Wash/dry face;Cueing for safety;Cueing for sequencing;Sitting;Set up   Upper Body Bathing: Cueing for safety;Cueing for sequencing;Sitting;Min guard   Lower Body Bathing: Minimal assistance;Cueing for safety;Cueing for sequencing;Sit to/from stand   Upper Body Dressing : Min guard;Cueing for safety;Cueing for sequencing;Sitting   Lower Body Dressing: Minimal assistance;Cueing for  safety;Cueing for sequencing;Sit to/from stand   Toilet Transfer: Rolling walker (2 wheels);BSC/3in1;Min guard   Toileting- Water quality scientist and Hygiene: Min guard;Cueing for safety;Cueing for sequencing;Sit to/from stand       Functional mobility  during ADLs: Min guard;Cueing for safety;Cueing for sequencing;Rolling walker (2 wheels) General ADL Comments: pt fatigues very easily     Vision Baseline Vision/History: 1 Wears glasses Patient Visual Report: No change from baseline       Perception     Praxis      Pertinent Vitals/Pain Pain Assessment: Faces Faces Pain Scale: Hurts a little bit Pain Location: back Pain Descriptors / Indicators: Aching Pain Intervention(s): Limited activity within patient's tolerance;Monitored during session;Repositioned     Hand Dominance Right   Extremity/Trunk Assessment Upper Extremity Assessment Upper Extremity Assessment: Generalized weakness   Lower Extremity Assessment Lower Extremity Assessment: Defer to PT evaluation   Cervical / Trunk Assessment Cervical / Trunk Assessment: Normal   Communication Communication Communication: No difficulties   Cognition Arousal/Alertness: Awake/alert Behavior During Therapy: WFL for tasks assessed/performed Overall Cognitive Status: History of cognitive impairments - at baseline Area of Impairment: Safety/judgement;Problem solving                         Safety/Judgement: Decreased awareness of safety Awareness: Emergent Problem Solving: Slow processing;Difficulty sequencing       General Comments  on 2L in session and education provided on how to use tank as reported they do not know how to use    Exercises     Shoulder Instructions      Home Living Family/patient expects to be discharged to:: Private residence Living Arrangements: Other relatives Available Help at Discharge: Family;Available PRN/intermittently Type of Home: House Home Access: Stairs to enter;Ramped entrance Entrance Stairs-Number of Steps: 3 in front, ramp in back Entrance Stairs-Rails: Right Home Layout: One level     Bathroom Shower/Tub: Teacher, early years/pre: Standard     Home Equipment: Cane - single point;Shower seat    Additional Comments: Pt reports they have a script for a wheelchair but did not get yet      Prior Functioning/Environment Prior Level of Function : Needs assist;Independent/Modified Independent       Physical Assist : ADLs (physical)     Mobility Comments: Pt reports sometimes they use cane or walls to walker around but does not go out of the home. ADLs Comments: Has needed help with ADLs. Does bird baths. reports to small of a space for transfer bench        OT Problem List: Decreased strength;Decreased activity tolerance;Decreased safety awareness;Decreased knowledge of use of DME or AE;Cardiopulmonary status limiting activity      OT Treatment/Interventions: Self-care/ADL training;Therapeutic exercise;Energy conservation;DME and/or AE instruction;Therapeutic activities;Patient/family education    OT Goals(Current goals can be found in the care plan section) Acute Rehab OT Goals Patient Stated Goal: to be able to get stronger OT Goal Formulation: With patient Time For Goal Achievement: 10/14/21 Potential to Achieve Goals: Good  OT Frequency: Min 2X/week   Barriers to D/C:            Co-evaluation              AM-PAC OT "6 Clicks" Daily Activity     Outcome Measure Help from another person eating meals?: None Help from another person taking care of personal grooming?: A Little Help from another person toileting, which includes using toliet, bedpan, or urinal?: A Little Help  from another person bathing (including washing, rinsing, drying)?: A Little Help from another person to put on and taking off regular upper body clothing?: None Help from another person to put on and taking off regular lower body clothing?: None 6 Click Score: 21   End of Session Equipment Utilized During Treatment: Gait belt;Rolling walker (2 wheels) Nurse Communication: Mobility status  Activity Tolerance: Patient tolerated treatment well Patient left: in chair;with call bell/phone  within reach;with chair alarm set  OT Visit Diagnosis: Unsteadiness on feet (R26.81);Other abnormalities of gait and mobility (R26.89);Repeated falls (R29.6);Muscle weakness (generalized) (M62.81);Dizziness and giddiness (R42)                Time: 5885-0277 OT Time Calculation (min): 40 min Charges:  OT General Charges $OT Visit: 1 Visit OT Evaluation $OT Eval Low Complexity: 1 Low OT Treatments $Self Care/Home Management : 23-37 mins  Joeseph Amor OTR/L  Acute Rehab Services  [phone] office number [phone] pager number   Joeseph Amor 09/30/2021, 12:05 PM

## 2021-09-30 NOTE — Evaluation (Signed)
Physical Therapy Evaluation Patient Details Name: [name] MRN: [id number] DOB: 06-Apr-1961 Today's Date: 09/30/2021  History of Present Illness  60 yr old female who presented 12/30 due to SOB.  Pt had a recent admission due to acute on chronic respiratory failure from volume overload associated with ESRD, COPD, and anemia. Pt reported they were not always using oxygen at home as they were not sure on how to use. PMHx: CKD stage V recently started HD, COPD Gold stage III noncompliant with home oxygen, IBS with chronic diarrhea, HTN, chronic diastolic CHF, CAD, PVD, s/p  bilateral common iliac stenting, severe protein calorie malnutrition, chronic anemia secondary to CKD  Clinical Impression  Pt was seen for mobility on RW with help to support and stabilize her.  Pt is unsafe to walk with no AD, but with rollator is more stable.  Her plan is to go home with a nephew, and to continue therapy as a home health pt.  Pt could benefit from a home rehab aide as well, to care for her and the home as she continues to recover.  If 24/7 help is not at home, will recommend her to go to SNF instead.  This reduces her fall risk, and gives her a chance to work on how to maneuver O2 or to get past needing it, whichever is possible.  Follow along with her to increase strength of LE's, balance skills and to increase quality and quantity of gait as her cardiovasc condition permits.     Recommendations for follow up therapy are one component of a multi-disciplinary discharge planning process, led by the attending physician.  Recommendations may be updated based on patient status, additional functional criteria and insurance authorization.  Follow Up Recommendations Skilled nursing-short term rehab (<3 hours/day)    Assistance Recommended at Discharge Frequent or constant Supervision/Assistance  Functional Status Assessment Patient has had a recent decline in their functional status and demonstrates the ability  to make significant improvements in function in a reasonable and predictable amount of time.  Equipment Recommendations  Rollator (4 wheels)    Recommendations for Other Services       Precautions / Restrictions Precautions Precautions: Fall Precaution Comments: watch O2 Restrictions Weight Bearing Restrictions: No      Mobility  Bed Mobility Overal bed mobility: Modified Independent                  Transfers Overall transfer level: Needs assistance Equipment used: Rollator (4 wheels) Transfers: Sit to/from Stand Sit to Stand: Min guard                Ambulation/Gait Ambulation/Gait assistance: Supervision Gait Distance (Feet): 150 Feet Assistive device: Rollator (4 wheels) Gait Pattern/deviations: Decreased stride length;Step-through pattern;Wide base of support Gait velocity: decreased Gait velocity interpretation: <1.31 ft/sec, indicative of household ambulator Pre-gait activities: standing balance control General Gait Details: pt walked on rollator with better tolerance on 2L O2, sats were 90% or greater  Stairs            Wheelchair Mobility    Modified Rankin (Stroke Patients Only)       Balance Overall balance assessment: Needs assistance;History of Falls Sitting-balance support: Feet supported;No upper extremity supported Sitting balance-Leahy Scale: Good     Standing balance support: Bilateral upper extremity supported;During functional activity Standing balance-Leahy Scale: Fair Standing balance comment: requires walker to control balance on hallway walk with O2  Pertinent Vitals/Pain Pain Assessment: Faces Faces Pain Scale: Hurts a little bit Pain Location: back Pain Intervention(s): Monitored during session;Repositioned    Home Living Family/patient expects to be discharged to:: Private residence Living Arrangements: Other relatives Available Help at Discharge: Family;Available  PRN/intermittently Type of Home: House Home Access: Stairs to enter;Ramped entrance Entrance Stairs-Rails: Right Entrance Stairs-Number of Steps: 3 in front, ramp in back   Home Layout: One level Home Equipment: Cane - single point;Shower seat Additional Comments: will need wc but has not filled prescription    Prior Function Prior Level of Function : Independent/Modified Independent;Needs assist       Physical Assist : Mobility (physical) Mobility (physical): Stairs   Mobility Comments: 3 steps no railing and avoids going outside       Hand Dominance   Dominant Hand: Right    Extremity/Trunk Assessment   Upper Extremity Assessment Upper Extremity Assessment: Defer to OT evaluation    Lower Extremity Assessment Lower Extremity Assessment: Generalized weakness    Cervical / Trunk Assessment Cervical / Trunk Assessment: Kyphotic (mild)  Communication   Communication: No difficulties  Cognition Arousal/Alertness: Awake/alert Behavior During Therapy: WFL for tasks assessed/performed Overall Cognitive Status: History of cognitive impairments - at baseline Area of Impairment: Problem solving;Safety/judgement;Following commands                       Following Commands: Follows one step commands with increased time Safety/Judgement: Decreased awareness of safety;Decreased awareness of deficits Awareness: Intellectual Problem Solving: Slow processing;Requires verbal cues General Comments: pt is frustrated with others but is related to her ability to follow medical instructions and their reminders        General Comments General comments (skin integrity, edema, etc.): 2L O2 for safety and maintenance of sats    Exercises     Assessment/Plan    PT Assessment Patient needs continued PT services  PT Problem List Decreased strength;Decreased range of motion;Decreased activity tolerance;Decreased balance;Decreased mobility;Decreased coordination;Decreased  knowledge of use of DME;Cardiopulmonary status limiting activity       PT Treatment Interventions DME instruction;Gait training;Stair training;Functional mobility training;Therapeutic activities;Therapeutic exercise;Balance training;Neuromuscular re-education;Patient/family education    PT Goals (Current goals can be found in the Care Plan section)  Acute Rehab PT Goals Patient Stated Goal: to go home PT Goal Formulation: With patient Time For Goal Achievement: 10/13/21 Potential to Achieve Goals: Good    Frequency Min 3X/week   Barriers to discharge Decreased caregiver support home with signficant other who is not always there    Co-evaluation               AM-PAC PT "6 Clicks" Mobility  Outcome Measure Help needed turning from your back to your side while in a flat bed without using bedrails?: None Help needed moving from lying on your back to sitting on the side of a flat bed without using bedrails?: None Help needed moving to and from a bed to a chair (including a wheelchair)?: A Little Help needed standing up from a chair using your arms (e.g., wheelchair or bedside chair)?: A Little Help needed to walk in hospital room?: A Little Help needed climbing 3-5 steps with a railing? : A Lot 6 Click Score: 19    End of Session Equipment Utilized During Treatment: Oxygen;Gait belt Activity Tolerance: Patient tolerated treatment well;Treatment limited secondary to medical complications (Comment) Patient left: in bed;with call bell/phone within reach;with bed alarm set Nurse Communication: Mobility status PT Visit Diagnosis: Muscle weakness (generalized) (  M62.81);Unsteadiness on feet (R26.81);Other abnormalities of gait and mobility (R26.89)    Time: 1791-9957 PT Time Calculation (min) (ACUTE ONLY): 29 min   Charges:   PT Evaluation $PT Eval Moderate Complexity: 1 Mod PT Treatments $Gait Training: 8-22 mins       Ramond Dial 09/30/2021, 5:08 PM  Mee Hives, PT  PhD Acute Rehab Dept. Number: [place] [facility] [place]

## 2021-09-30 NOTE — Progress Notes (Addendum)
[facility] Progress Note  Subjective: seen in room, SOB improved  Vitals:   09/30/21 0439 09/30/21 0443 09/30/21 0724 09/30/21 0757  BP: (!) 161/80  (!) 164/87   Pulse: 89  87   Resp: 19  19   Temp: 98.9 F (37.2 C)  98.1 F (36.7 C)   TempSrc: Oral  Oral   SpO2: 97% 98% 98% 99%  Weight:      Height:        Exam:  alert, nad , nasal O2  no jvd  Chest air movement much better today, no rales  Cor reg no RG  Abd soft ntnd no ascites   Ext no LE edema   Alert, NF, ox3 LUA AVG +bruit, R IJ TDC   CXR - IMPRESSION: 1. Stable cardiomegaly without overt pulmonary edema. 2. Stable small bilateral pleural effusions, left greater than right. 3. Stable patchy bibasilar lung consolidation with associated volume loss, favor atelectasis, difficult to exclude a component of pneumonia.    OP HD: MWF GO   3h 77min  40.8kg  3K/2.5 bath  Hep none  TDC/ LUA AVG done 09/14/21    - venofer 100 q HD thru 1/20   - home BP > labetalol 100 bid, norvasc 10  Assessment/ Plan: ACRF - due to vol ^, severe COPD exacerbation + possible CAP. On IV abx. Much better w/ medical Rx + dialysis yesterday. Moving air today. 2kg under dry wt now, has lost body wt.  ESRD - on HD MWF. Next HD Monday if still here.  HD access - using TDC, just had AVG placed 2 wks ago, not ready COPD - on home O2 HTN - getting home meds, BP's slightly up, no vol ^ on exam MBD ckd - no vdra, Ca in range, cont phoslo Anemia ckd - Hb 8-10 range here, cont IV Fe load     [name] 09/30/2021, 10:25 AM   Recent Labs  Lab 09/29/21 0732 09/30/21 0402  K 4.0 3.6  BUN 31* 23*  CREATININE 3.05* 2.27*  CALCIUM 9.0 8.7*  HGB 9.4* 8.7*   Inpatient medications:  amLODipine  10 mg Oral Daily   atorvastatin  80 mg Oral QHS   calcium acetate  2,001 mg Oral TID WC   And   calcium acetate  667 mg Oral With snacks   Chlorhexidine Gluconate Cloth  6 each Topical Q0600   ezetimibe  10 mg Oral Daily   heparin   5,000 Units Subcutaneous Q8H   ipratropium-albuterol  3 mL Nebulization Q6H   isosorbide mononitrate  30 mg Oral Daily   labetalol  100 mg Oral BID   linaclotide  72 mcg Oral QAC breakfast   megestrol  40 mg Oral Daily   mometasone-formoterol  2 puff Inhalation Q12H   multivitamin  1 tablet Oral QHS   nicotine  14 mg Transdermal Daily   pantoprazole  40 mg Oral Daily   predniSONE  40 mg Oral Q breakfast   sodium chloride flush  3 mL Intravenous Q12H   venlafaxine XR  300 mg Oral Q breakfast    acetaminophen **OR** acetaminophen, albuterol, ALPRAZolam, calcium carbonate (dosed in mg elemental calcium), camphor-menthol **AND** hydrOXYzine, docusate sodium, feeding supplement (NEPRO CARB STEADY), metoprolol tartrate, nitroGLYCERIN, ondansetron **OR** ondansetron (ZOFRAN) IV, oxyCODONE-acetaminophen, polyethylene glycol, sorbitol, traZODone, zolpidem

## 2021-09-30 NOTE — Care Management Obs Status (Signed)
[facility] NOTIFICATION   Patient Details  Name: [name] MRN: [id number] Date of Birth: 1961/04/11   Medicare Observation Status Notification Given:  Yes    Carles Collet, RN 09/30/2021, 4:35 PM

## 2021-09-30 NOTE — Progress Notes (Signed)
Progress Note    ESTHA FEW  OEU:[id number] DOB: 1961-08-05  DOA: 09/29/2021 PCP: Bernerd Limbo, MD    Brief Narrative:     Medical records reviewed and are as summarized below:  [name] is an 60 y.o. female with medical history significant of CAD; aortic stenosis s/p distal aortic stent (2019); bipolar/schizophrenia; remote cervical CA; COPD; ESRD on MWF HD; HTN; HLD; IBS; PAD; hypothyroidism; and RLS presenting with SOB.  She was last admitted from 12/19-22 due to acute on chronic respiratory failure from volume overload associated with ESRD, COPD, and anemia.  She got very SOB.  She has been having this issue for a weeks and it really got worse last night.  Assessment/Plan:   Principal Problem:   Hypervolemia associated with renal insufficiency Active Problems:   Essential hypertension   Heart failure with preserved ejection fraction (HCC)   Affective bipolar disorder (HCC)   COPD with acute exacerbation (HCC)   Dyslipidemia   Hypothyroidism (acquired)   Goals of care, counseling/discussion   Chronic pain disorder   Hypervolemia associated with renal insufficiency -appears that she has not been able to effectively volume offload and so is hypervolemic -Respiratory distress appears to be related to this -However, she is also quite anxious and is uncertain about continuing HD (see below) -She is likely to benefit from serial HD potentially both today and tomorrow and may be appropriate for d/c to home tomorrow  -Patient on chronic MWF HD -Nephrology prn order set utilized -Nephrology is aware that patient will need HD -Continue rena-vite, Phoslo   COPD with acute exacerbation ([place])- (present on admission) -She is presenting with significant respiratory distress but she is not currently hypoxic -Suspect significant anxious component (see below) -However, she may also have mild exacerbation -She is continuing to "cheat" and periodically smoke -Continue  Dulera -Solumedrol -> prednisone -Duonebs -Continue prn Albuterol but change to nebs q2h prn -nicotine patch and smoking cessation counseling   Chronic pain disorder- (present on admission) -I have reviewed this patient in the  Controlled Substances Reporting System.  She is receiving medications from only one provider and appears to be taking them as prescribed. -She is at high risk of opioid misuse, diversion, or overdose. -Continue Percocet without plan for escalation at this time.   Goals of care, counseling/discussion -palliative care consult   Hypothyroidism (acquired)- (present on admission) -She does not appear to be taking medications for this issue -TSH normal   Dyslipidemia- (present on admission) -Continue Lipitor, Zetia   Affective bipolar disorder ([place])- (present on admission) -She has reported h/o bipolar as well as listed schizophrenia - but is not taking medications for schizophrenia so this diagnosis is suspect -Will continue home Effexor, trazodone, Megace -Would use Haldol if needed for agitation or home Xanax   Heart failure with preserved ejection fraction ([place])- (present on admission) -Generally preserved EF on recent myoview and cath -Volume control with HD   Essential hypertension- (present on admission) -Continue Lopressor, Imdur, Norvasc -She was also given PRN IV Metoprolol (reports hydralazine allergy)    Elevated d dimer -unclear reason why ordered in ER and appears no plan for follow up -will get CTA to r/o PE  Family Communication/Anticipated D/C date and plan/Code Status   DVT prophylaxis: heparin Code Status: DNR Disposition Plan: Status is: Observation  The patient will require care spanning > 2 midnights and should be moved to inpatient because: needs further work up of SOB  Medical Consultants:   Renal Palliative care  Subjective:   Breathing is not much better  Objective:    Vitals:   09/30/21 0443 09/30/21  0724 09/30/21 0757 09/30/21 1142  BP:  (!) 164/87  132/80  Pulse:  87  80  Resp:  19  19  Temp:  98.1 F (36.7 C)  98.1 F (36.7 C)  TempSrc:  Oral  Oral  SpO2: 98% 98% 99% 97%  Weight:      Height:        Intake/Output Summary (Last 24 hours) at 09/30/2021 1339 Last data filed at 09/30/2021 1100 Gross per 24 hour  Intake 480 ml  Output 3350 ml  Net -2870 ml   Filed Weights   09/29/21 0801 09/29/21 1808 09/30/21 0042  Weight: 42.2 kg 37.8 kg 38 kg    Exam:  General: Appearance:    Thin female sitting upright in tripod     Lungs:     Diminished, no wheezing, respirations unlabored  Heart:    Normal heart rate.    MS:   All extremities are intact.    Neurologic:   Awake, alert, oriented x 3.     Data Reviewed:   I have personally reviewed following labs and imaging studies:  Labs: Labs show the following:   Basic Metabolic Panel: Recent Labs  Lab 09/29/21 0732 09/30/21 0402  NA 138 134*  K 4.0 3.6  CL 101 96*  CO2 23 25  GLUCOSE 118* 103*  BUN 31* 23*  CREATININE 3.05* 2.27*  CALCIUM 9.0 8.7*   GFR Estimated Creatinine Clearance: 16.2 mL/min (A) (by C-G formula based on SCr of 2.27 mg/dL (H)). Liver Function Tests: No results for input(s): AST, ALT, ALKPHOS, BILITOT, PROT, ALBUMIN in the last 168 hours. No results for input(s): LIPASE, AMYLASE in the last 168 hours. No results for input(s): AMMONIA in the last 168 hours. Coagulation profile No results for input(s): INR, PROTIME in the last 168 hours.  CBC: Recent Labs  Lab 09/29/21 0732 09/30/21 0402  WBC 11.0* 6.1  NEUTROABS 9.9*  --   HGB 9.4* 8.7*  HCT 30.2* 27.6*  MCV 101.3* 97.9  PLT 295 225   Cardiac Enzymes: No results for input(s): CKTOTAL, CKMB, CKMBINDEX, TROPONINI in the last 168 hours. BNP (last 3 results) No results for input(s): PROBNP in the last 8760 hours. CBG: No results for input(s): GLUCAP in the last 168 hours. D-Dimer: Recent Labs    09/29/21 1101  DDIMER  3.45*   Hgb A1c: No results for input(s): HGBA1C in the last 72 hours. Lipid Profile: No results for input(s): CHOL, HDL, LDLCALC, TRIG, CHOLHDL, LDLDIRECT in the last 72 hours. Thyroid function studies: Recent Labs    09/29/21 1835  TSH 1.651   Anemia work up: No results for input(s): VITAMINB12, FOLATE, FERRITIN, TIBC, IRON, RETICCTPCT in the last 72 hours. Sepsis Labs: Recent Labs  Lab 09/29/21 0732 09/30/21 0402  WBC 11.0* 6.1    Microbiology Recent Results (from the past 240 hour(s))  Resp Panel by RT-PCR (Flu A&B, Covid) Nasopharyngeal Swab     Status: None   Collection Time: 09/29/21  6:53 AM   Specimen: Nasopharyngeal Swab; Nasopharyngeal(NP) swabs in vial transport medium  Result Value Ref Range Status   SARS Coronavirus 2 by RT PCR NEGATIVE NEGATIVE Final    Comment: (NOTE) SARS-CoV-2 target nucleic acids are NOT DETECTED.  The SARS-CoV-2 RNA is generally detectable in upper respiratory specimens during the acute phase of infection. The  lowest concentration of SARS-CoV-2 viral copies this assay can detect is 138 copies/mL. A negative result does not preclude SARS-Cov-2 infection and should not be used as the sole basis for treatment or other patient management decisions. A negative result may occur with  improper specimen collection/handling, submission of specimen other than nasopharyngeal swab, presence of viral mutation(s) within the areas targeted by this assay, and inadequate number of viral copies(<138 copies/mL). A negative result must be combined with clinical observations, patient history, and epidemiological information. The expected result is Negative.  Fact Sheet for Patients:  EntrepreneurPulse.com.au  Fact Sheet for Healthcare Providers:  IncredibleEmployment.be  This test is no t yet approved or cleared by the [place] FDA and  has been authorized for detection and/or diagnosis of SARS-CoV-2 by FDA  under an Emergency Use Authorization (EUA). This EUA will remain  in effect (meaning this test can be used) for the duration of the COVID-19 declaration under Section 564(b)(1) of the Act, 21 U.S.C.section 360bbb-3(b)(1), unless the authorization is terminated  or revoked sooner.       Influenza A by PCR NEGATIVE NEGATIVE Final   Influenza B by PCR NEGATIVE NEGATIVE Final    Comment: (NOTE) The Xpert Xpress SARS-CoV-2/FLU/RSV plus assay is intended as an aid in the diagnosis of influenza from Nasopharyngeal swab specimens and should not be used as a sole basis for treatment. Nasal washings and aspirates are unacceptable for Xpert Xpress SARS-CoV-2/FLU/RSV testing.  Fact Sheet for Patients: EntrepreneurPulse.com.au  Fact Sheet for Healthcare Providers: IncredibleEmployment.be  This test is not yet approved or cleared by the [place] FDA and has been authorized for detection and/or diagnosis of SARS-CoV-2 by FDA under an Emergency Use Authorization (EUA). This EUA will remain in effect (meaning this test can be used) for the duration of the COVID-19 declaration under Section 564(b)(1) of the Act, 21 U.S.C. section 360bbb-3(b)(1), unless the authorization is terminated or revoked.  Performed at [facility], [place] [address] [place], [place] [address]     Procedures and diagnostic studies:  DG Chest Port 1 View  Result Date: 09/29/2021 CLINICAL DATA:  chest pain, SOB EXAM: PORTABLE CHEST 1 VIEW COMPARISON:  09/18/2021 chest radiograph. FINDINGS: Right internal jugular central venous catheter terminates over the cavoatrial junction. Stable cardiomediastinal silhouette with mild cardiomegaly. No pneumothorax. Small bilateral pleural effusions, left greater than right, stable. Cephalization of the pulmonary vasculature without overt pulmonary edema. Patchy bibasilar lung consolidation with associated volume loss, most prominent at the  medial lung bases, unchanged. IMPRESSION: 1. Stable cardiomegaly without overt pulmonary edema. 2. Stable small bilateral pleural effusions, left greater than right. 3. Stable patchy bibasilar lung consolidation with associated volume loss, favor atelectasis, difficult to exclude a component of pneumonia. Electronically Signed   By: Ilona Sorrel M.D.   On: 09/29/2021 08:23    Medications:    amLODipine  10 mg Oral Daily   atorvastatin  80 mg Oral QHS   calcium acetate  2,001 mg Oral TID WC   And   calcium acetate  667 mg Oral With snacks   Chlorhexidine Gluconate Cloth  6 each Topical Q0600   ezetimibe  10 mg Oral Daily   heparin  5,000 Units Subcutaneous Q8H   ipratropium-albuterol  3 mL Nebulization Q6H   isosorbide mononitrate  30 mg Oral Daily   labetalol  100 mg Oral BID   linaclotide  72 mcg Oral QAC breakfast   megestrol  40 mg Oral Daily   mometasone-formoterol  2  puff Inhalation Q12H   multivitamin  1 tablet Oral QHS   nicotine  14 mg Transdermal Daily   pantoprazole  40 mg Oral Daily   predniSONE  40 mg Oral Q breakfast   sodium chloride flush  3 mL Intravenous Q12H   venlafaxine XR  300 mg Oral Q breakfast   Continuous Infusions:  [START ON 10/02/2021] ferric gluconate (FERRLECIT) IVPB       LOS: 0 days   Geradine Girt  [facility]   How to contact the [facility] Attending or Consulting provider [place] or covering provider during after hours [place], for this patient?  Check the care team in [facility] and look for a) attending/consulting TRH provider listed and b) the [facility] team listed Log into [email or web address] and use [place]'s universal password to access. If you do not have the password, please contact the hospital operator. Locate the [facility] provider you are looking for under [facility] and page to a number that you can be directly reached. If you still have difficulty reaching the provider, please page the [facility] (Director on Call) for the Hospitalists listed on amion  for assistance.  09/30/2021, 1:39 PM

## 2021-09-30 NOTE — Consult Note (Signed)
Consultation Note Date: 09/30/2021   Patient Name: [name]  DOB: 04/14/1961  MRN: [id number]  Age / Sex: 60 y.o., female  PCP: Bernerd Limbo, MD Referring Physician: Geradine Girt, DO  Reason for Consultation: Establishing goals of care "maybe stopping HD"  HPI/Patient Profile: 60 y.o. female  with past medical history of ESRD on HD MWF, COPD, CAD, aortic stenosis s/p distal aortic stent (2019), bipolar disorder, schizophrenia, remote cervical cancer, HTN, PAD, and IBS who presented to the emergency department on 09/29/2021 with shortness of breath.  Chest x-ray showed stable small bilateral pleural effusions and stable patchy bibasilar lung consolidation with associated volume loss, likely atelectasis but difficult to exclude a component of pneumonia. Admitted to [facility] with hypervolemia associated with renal failure.   She was recently admitted 12/19-12/22 with acute on chronic respiratory failure from volume overload associated with ESRD. New O2 requirement since discharge.   Clinical Assessment and Goals of Care: I have reviewed medical records including EPIC notes, labs and imaging, and met at bedside with patient to discuss diagnosis, prognosis, GOC, EOL wishes, disposition, and options. She is sitting up in bed, eating lunch. Her most significant complaint at this time is her dentures. She tells me they are over 71 years old and no longer fit. This makes it difficult for her to eat. She is requesting someone to come to the hospital to fit her for new dentures - I told her that I was unaware of any such service but would ask social work.   I introduced Palliative Medicine as specialized medical care for people living with serious illness. It focuses on providing relief from the symptoms and stress of a serious illness.   We discussed a brief life review of the patient. She has one daughter, who she  does not have a good relationship with. She tells me her daughter has struggled with substance abuse for many years. She has 4 grandsons by this daughter. She essentially raised the oldest 2 of these grandsons, who are now ages 106 and 63.   Mieka lives in her home with her 55 year old grandson and her significant other. She has been with her significant other for 18 years, but she tells me that he is not helpful and she is thinking of asking him to move out. As far as functional status, she is ambulatory but uses a wheelchair to get around her house. She reports it is becoming increasingly difficult to care for herself. She wants to hire a private caregiver to come to her house 2-3 days per week for a few hours.   We discussed her current illness and what it means in the larger context of her ongoing co-morbidities.  Natural disease trajectory of COPD and ESRD was discussed. She is recently started on dialysis (for about 2 months). Discussed that this has been a major life transition. I asked Hilbert Corrigan about her discussion with the admitting physician (Dr. Lorin Mercy), during which she stated that she felt miserable since starting dialysis and had  considered stopping. Ninah does acknowledge making this statement, but she says she did not mean it.   I attempted to elicit values and goals of care important to the patient. Her family is everything to her. She intends to continue dialysis as long as possible in order to be there for her family. She also has a strong faith in God. Her goal is to feel better and return home at discharge.   The difference between full scope medical intervention and comfort care was considered. Briefly discussed that dialysis is considered a life-prolonging intervention. Discussed that many patients are eventually faced with the decision of stopping dialysis, either because it is no longer allowing for good quality of life or because they are no longer able to tolerate it.    Advanced directives, concepts specific to code status, artifical feeding and hydration, and rehospitalization were considered and discussed. Confirmed code status as DNR/DNI.  We discussed having a medical decision maker if she herself is unable. She states this would be her oldest grandsons, Ellin Mayhew (68 year old) and Ezequiel Essex (58 year old). She thinks they have done paperwork - I ask her to provide a copy when able.   Questions and concerns were addressed.  The patient was encouraged to call with questions or concerns.    Primary decision maker: Patient    SUMMARY OF RECOMMENDATIONS   Continue current care Patient has no intention of stopping dialysis Goal of care is to return home [facility] consult for resources on private care giving and dental care  No additional PMT needs at this time, please call [phone] if we can be of additional assistance or need to actively re-engage with this patient.  Code Status/Advance Care Planning: DNR  Symptom Management:  Agree with xanax 1 mg BID as needed for anxiety  Additional Recommendations (Limitations, Scope, Preferences): Full Scope Treatment  Prognosis:  Unable to determine  Discharge Planning:  Home (with home health?)     Primary Diagnoses: Present on Admission:  COPD with acute exacerbation ([place])  Affective bipolar disorder ([place])  Heart failure with preserved ejection fraction ([place])  Essential hypertension  Dyslipidemia  Hypothyroidism (acquired)  Chronic pain disorder   I have reviewed the medical record, interviewed the patient and family, and examined the patient. The following aspects are pertinent.  Past Medical History:  Diagnosis Date   Aortic stenosis    abominal aorta, s/p distal aortic stent 09/28/18, 12/01/20   Bipolar 1 disorder (HCC)    Blood transfusion without reported diagnosis    Carpal tunnel syndrome    Cataract    forming   Cervical cancer ([place]) 1985   CHF (congestive heart  failure) ([place])    Chronic kidney disease (CKD), stage V ([place])    COPD (chronic obstructive pulmonary disease) ([place]) 2000   Coronary artery disease    Diverticulitis 2008   GERD (gastroesophageal reflux disease)    Glaucoma    HLD (hyperlipidemia) 2013   Hypertension 2013   Hypovitaminosis D    IBS (irritable bowel syndrome) 2008   PAD (peripheral artery disease) (HCC)    Pancreatitis 10/2011   RLS (restless legs syndrome)    Schizophrenia (HCC)    Skin cancer    Small bowel obstruction ([place]) 2008   Thyroid disease      Allergies  Allergen Reactions   Doxycycline Anaphylaxis and Hives   Hydralazine Shortness Of Breath and Rash    Patient reports "couldn't breath"   Aspirin Other (See Comments)  Internal bleeding- MD SAID to not take this   Hydrocodone Nausea And Vomiting   Ibuprofen Other (See Comments)    Caused internal bleeding   Tylenol [Acetaminophen] Other (See Comments)    MD told the patient to not take this   Iohexol Itching and Other (See Comments)    Pt has itching nose after iv contrast injection   Review of Systems  HENT:  Positive for dental problem.   Neurological:  Positive for weakness.   Physical Exam Constitutional:      Comments: Frail and chronically ill-appearing Appears older than stated age  Pulmonary:     Effort: Pulmonary effort is normal.  Neurological:     Mental Status: She is alert and oriented to person, place, and time.     Motor: Weakness present.  Psychiatric:        Mood and Affect: Affect is tearful.        Behavior: Behavior normal.        Judgment: Judgment normal.    Vital Signs: BP (!) 164/87 (BP Location: Right Arm)    Pulse 87    Temp 98.1 F (36.7 C) (Oral)    Resp 19    Ht _0  (1.549 m)    Wt 38 kg    SpO2 99%    BMI 15.81 kg/m  Pain Scale: 0-10   Pain Score: 0-No pain   SpO2: SpO2: 99 % O2 Device:SpO2: 99 % O2 Flow Rate: .O2 Flow Rate (L/min): 2 L/min   LBM: Last BM Date: 09/29/21 Baseline Weight:  Weight: 42.2 kg Most recent weight: Weight: 38 kg      Palliative Assessment/Data: PPS 40-50%     Time In: 1145 Time Out: 1257 Time Total: 72 minutes Greater than 50%  of this time was spent counseling and coordinating care related to the above assessment and plan.  Signed by: Lavena Bullion, NP   Please contact Palliative Medicine Team phone at [phone] for questions and concerns.  For individual provider: See Shea Evans

## 2021-10-01 ENCOUNTER — Observation Stay (HOSPITAL_COMMUNITY): Payer: Medicare Other

## 2021-10-01 ENCOUNTER — Encounter (HOSPITAL_COMMUNITY): Payer: Self-pay

## 2021-10-01 DIAGNOSIS — E8770 Fluid overload, unspecified: Secondary | ICD-10-CM | POA: Diagnosis not present

## 2021-10-01 DIAGNOSIS — N289 Disorder of kidney and ureter, unspecified: Secondary | ICD-10-CM | POA: Diagnosis not present

## 2021-10-01 LAB — BASIC METABOLIC PANEL
Anion gap: 11 (ref 5–15)
BUN: 52 mg/dL — ABNORMAL HIGH (ref 6–20)
CO2: 27 mmol/L (ref 22–32)
Calcium: 8.8 mg/dL — ABNORMAL LOW (ref 8.9–10.3)
Chloride: 94 mmol/L — ABNORMAL LOW (ref 98–111)
Creatinine, Ser: 3.46 mg/dL — ABNORMAL HIGH (ref 0.44–1.00)
GFR, Estimated: 15 mL/min — ABNORMAL LOW (ref >60)
Glucose, Bld: 129 mg/dL — ABNORMAL HIGH (ref 70–99)
Potassium: 4.1 mmol/L (ref 3.5–5.1)
Sodium: 132 mmol/L — ABNORMAL LOW (ref 135–145)

## 2021-10-01 LAB — CBC
HCT: 28.5 % — ABNORMAL LOW (ref 36.0–46.0)
Hemoglobin: 9 g/dL — ABNORMAL LOW (ref 12.0–15.0)
MCH: 31.3 pg (ref 26.0–34.0)
MCHC: 31.6 g/dL (ref 30.0–36.0)
MCV: 99 fL (ref 80.0–100.0)
Platelets: 260 10*3/uL (ref 150–400)
RBC: 2.88 MIL/uL — ABNORMAL LOW (ref 3.87–5.11)
RDW: 19.6 % — ABNORMAL HIGH (ref 11.5–15.5)
WBC: 9.2 10*3/uL (ref 4.0–10.5)
nRBC: 0 % (ref 0.0–0.2)

## 2021-10-01 MED ORDER — IOHEXOL 350 MG/ML SOLN
70.0000 mL | Freq: Once | INTRAVENOUS | Status: AC | PRN
  Administered 2021-10-01: 70 mL via INTRAVENOUS

## 2021-10-01 MED ORDER — PREDNISONE 20 MG PO TABS: 40.0000 mg | ORAL_TABLET | Freq: Every day | ORAL | 0 refills | Status: AC

## 2021-10-01 MED ORDER — ALBUTEROL SULFATE (2.5 MG/3ML) 0.083% IN NEBU: 2.5000 mg | INHALATION_SOLUTION | RESPIRATORY_TRACT | 0 refills | Status: AC | PRN

## 2021-10-01 NOTE — Progress Notes (Signed)
[facility] Progress Note  Subjective: seen in room, breathing much better  Vitals:   09/30/21 2010 10/01/21 0332 10/01/21 0816 10/01/21 1100  BP:  (!) 152/87  137/75  Pulse:  84  74  Resp:  13  16  Temp:  97.9 F (36.6 C)  98 F (36.7 C)  TempSrc:  Oral  Oral  SpO2: 98% 92% 99% 97%  Weight:  39.6 kg    Height:        Exam:  alert, nad , nasal O2  no jvd  Chest air movement much better today, no rales  Cor reg no RG  Abd soft ntnd no ascites   Ext no LE edema   Alert, NF, ox3 LUA AVG +bruit, R IJ TDC   CXR - IMPRESSION: 1. Stable cardiomegaly without overt pulmonary edema. 2. Stable small bilateral pleural effusions, left greater than right. 3. Stable patchy bibasilar lung consolidation with associated volume loss, favor atelectasis, difficult to exclude a component of pneumonia.    OP HD: MWF GO   3h 56min  40.8kg  3K/2.5 bath  Hep none  TDC/ LUA AVG done 09/14/21    - venofer 100 q HD thru 1/20   - home BP > labetalol 100 bid, norvasc 10  Assessment/ Plan: ACRF - due to vol ^, severe COPD exacerbation + possible CAP. On IV abx. Much better w/ medical Rx + dialysis 12/30.  BP/volume - 1- 2kg under dry wt , lower dry wt dc ESRD - on HD MWF. Next HD Monday if still here.  HD access - using TDC. SP new AVG placed 2 wks ago, not ready yet COPD - on home O2 HTN - getting home meds, BP's slightly up, no vol ^ on exam MBD ckd - no vdra, Ca in range, cont phoslo Anemia ckd - Hb 8-10 range here, cont IV Fe load Dispo - possible dc today per pmd     Rob Shaquel Josephson 10/01/2021, 11:35 AM   Recent Labs  Lab 09/30/21 0402 10/01/21 0351  K 3.6 4.1  BUN 23* 52*  CREATININE 2.27* 3.46*  CALCIUM 8.7* 8.8*  HGB 8.7* 9.0*    Inpatient medications:  amLODipine  10 mg Oral Daily   atorvastatin  80 mg Oral QHS   calcium acetate  2,001 mg Oral TID WC   And   calcium acetate  667 mg Oral With snacks   Chlorhexidine Gluconate Cloth  6 each Topical Q0600    ezetimibe  10 mg Oral Daily   heparin  5,000 Units Subcutaneous Q8H   ipratropium-albuterol  3 mL Nebulization Q6H   isosorbide mononitrate  30 mg Oral Daily   labetalol  100 mg Oral BID   linaclotide  72 mcg Oral QAC breakfast   megestrol  40 mg Oral Daily   mometasone-formoterol  2 puff Inhalation Q12H   multivitamin  1 tablet Oral QHS   nicotine  14 mg Transdermal Daily   pantoprazole  40 mg Oral Daily   predniSONE  40 mg Oral Q breakfast   sodium chloride flush  3 mL Intravenous Q12H   venlafaxine XR  300 mg Oral Q breakfast    [START ON 10/02/2021] ferric gluconate (FERRLECIT) IVPB     acetaminophen **OR** acetaminophen, albuterol, ALPRAZolam, calcium carbonate (dosed in mg elemental calcium), camphor-menthol **AND** hydrOXYzine, docusate sodium, feeding supplement (NEPRO CARB STEADY), metoprolol tartrate, nitroGLYCERIN, ondansetron **OR** ondansetron (ZOFRAN) IV, oxyCODONE-acetaminophen, polyethylene glycol, sorbitol, traZODone, zolpidem

## 2021-10-01 NOTE — TOC Transition Note (Addendum)
Transition of Care [facility]) - CM/SW Discharge Note   Patient Details  Name: [name] MRN: [id number] Date of Birth: [birth year]  Transition of [facility]) CM/SW Contact:  Carles Collet, RN Phone Number: 10/01/2021, 12:51 PM   Clinical Narrative:    Damaris Schooner w patient. She states that she has a ride home, family will bring portable oxygen. She requested rollator, order placed and it will be delivered to room today. [place] services set up w Alvis Lemmings    Final next level of care: Home w Home Health Services Barriers to Discharge: No Barriers Identified   Patient Goals and CMS Choice   CMS Medicare.gov Compare Post Acute Care list provided to:: Patient Choice offered to / list presented to : Patient  Discharge Placement                       Discharge Plan and Services                DME Arranged: Walker rolling with seat DME Agency: [employer] Date DME Agency Contacted: 10/01/21 Time DME Agency Contacted: [phone] Representative spoke with at DME Agency: [place]: PT, OT [place] Agency: [place] Date [place]: 10/01/21 Time [place]: [phone] Representative spoke with at [place]: [place] ([place]) Interventions     Readmission Risk Interventions No flowsheet data found.

## 2021-10-01 NOTE — TOC Initial Note (Signed)
Transition of Care [facility]) - Initial/Assessment Note    Patient Details  Name: [name] MRN: [id number] Date of Birth: Jan 22, 1961  Transition of Care [facility]) CM/SW Contact:    Bary Castilla, LCSW Phone Number:[phone] 10/01/2021, 10:40 AM  Clinical Narrative:                  CSW met with pt to discuss the recommendation from PT for SNF. Pt was aware of recommendation however does not want to go to SNF. Pt explained that her oldest grandson Camillia Herter has offered for her to come to stay with his family for a couple of weeks. Pt explains that Dylan's wife Chloe does not work and can provide the 24/7 care that is needed. Pt called Dylan while CSW was in room and he is in agreement with pt coming to home at DC.  Pt will be discharging to [place], [place] [address] .Pt is in agreement with [facility] and stated that she has a shower chair however is in need of a small walker. Pt states that she has not received [place] services previously.  CSW notified MD and RN CM of disposition plan.  TOC team will continue to assist with discharge planning needs.     Expected Discharge Plan: [place] Barriers to Discharge: Continued Medical Work up   Patient Goals and CMS Choice        Expected Discharge Plan and Services Expected Discharge Plan: [facility]       Living arrangements for the past 2 months: Single Family Home                                      Prior Living Arrangements/Services Living arrangements for the past 2 months: Single Family Home Lives with:: Relatives Patient language and need for interpreter reviewed:: Yes          Care giver support system in place?: Yes (comment) Current home services: DME    Activities of Daily Living Home Assistive Devices/Equipment: Oxygen, Dentures (specify type) ADL Screening (condition at time of admission) Patient's cognitive ability adequate to safely complete daily activities?:  Yes Is the patient deaf or have difficulty hearing?: No Does the patient have difficulty seeing, even when wearing glasses/contacts?: No Does the patient have difficulty concentrating, remembering, or making decisions?: No Patient able to express need for assistance with ADLs?: Yes Does the patient have difficulty dressing or bathing?: No Independently performs ADLs?: Yes (appropriate for developmental age) Does the patient have difficulty walking or climbing stairs?: No Weakness of Legs: None Weakness of Arms/Hands: None  Permission Sought/Granted      Share Information with NAME: Kirk Ruths  Permission granted to share info w AGENCY: [facility]  Permission granted to share info w Relationship: grandson  Permission granted to share info w Contact Information: [phone]  Emotional Assessment Appearance:: Appears stated age   Affect (typically observed): Accepting, Adaptable Orientation: : Oriented to Self, Oriented to Place, Oriented to  Time, Oriented to Situation      Admission diagnosis:  COPD exacerbation ([place]) [J44.1] COPD with acute exacerbation ([place]) [J44.1] Hypervolemia, unspecified hypervolemia type [E87.70] Patient Active Problem List   Diagnosis Date Noted   COPD with acute exacerbation ([place]) 09/29/2021   Hypervolemia associated with renal insufficiency 09/29/2021   Dyslipidemia 09/29/2021   Hypothyroidism (acquired) 09/29/2021   Goals of  care, counseling/discussion 09/29/2021   Chronic pain disorder 09/29/2021   COPD (chronic obstructive pulmonary disease) ([place]) 09/18/2021   Acute on chronic anemia    Nausea without vomiting    Acute hypoxemic respiratory failure ([place]) 08/16/2021   Dark stools    Diverticulosis    Adverse reaction to antiplatelet agent, sequela    AKI (acute kidney injury) ([place]) 05/11/2021   Chest pain, rule out acute myocardial infarction 05/09/2021   Abnormal EKG    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[place]) 12/30/2020   Chronic mesenteric ischemia ([place])  12/28/2020   Polyp of ascending colon    AVM (arteriovenous malformation) of small bowel, acquired with hemorrhage    GIB (gastrointestinal bleeding) 08/23/2020   Hematochezia    Diverticulosis of colon without hemorrhage    Pain of upper abdomen    Angiodysplasia of colon 08/16/2020   Acute GI bleeding 08/05/2020   Acute blood loss anemia 08/05/2020   Renal artery stenosis (HCC) 05/10/2020   Abdominal aortic stenosis 11/30/2018   Pure hypercholesterolemia 10/22/2018   Abnormal stress test 03/21/2018   At high risk for injury related to fall 12/26/2017   Renal artery stenosis, native, bilateral ([place]) 10/27/2017   Benign neoplasm of transverse colon    Benign neoplasm of descending colon    Benign neoplasm of sigmoid colon    Abnormality on screening test 06/22/2016   Hypertensive urgency 11/01/2015   Chest pain 11/01/2015   History of partial colectomy 10/19/2014   Renovascular hypertension, malignant 09/07/2014   Anemia, chronic disease 07/30/2014   Community acquired pneumonia 07/02/2014   Heart failure with preserved ejection fraction ([place]) 07/02/2014   COPD GOLD 0 / still smoking 07/02/2014   CKD (chronic kidney disease) stage 4, GFR 15-29 ml/min ([place]) 07/02/2014   Protein-calorie malnutrition, severe ([place]) 07/02/2014   Hyperparathyroidism, secondary renal ([place]) 04/30/2014   Postsurgical menopause 08/14/2013   Vaginal atrophy 08/14/2013   Screening for breast cancer 07/21/2012   Diverticulitis of colon with perforation 06/18/2012   Affective bipolar disorder ([place]) 02/02/2009   ADHESIONS, INTESTINAL W/OBSTRUCTION 08/10/2008   Irritable bowel syndrome 08/10/2008   Abdominal pain, generalized 08/10/2008   FECAL OCCULT BLOOD 08/10/2008   Anemia, iron deficiency 08/10/2008   Anxiety state 08/09/2008   DEPRESSION 08/09/2008   Essential hypertension 08/09/2008   ASTHMA 08/09/2008   ESOPHAGEAL STRICTURE 08/09/2008   GERD 08/09/2008   HIATAL HERNIA 08/09/2008    Diverticulosis of large intestine 08/09/2008   ARTHRITIS 08/09/2008   HEADACHE, CHRONIC 08/09/2008   SKIN CANCER, HX OF 08/09/2008   COLONIC POLYPS, HYPERPLASTIC, HX OF 08/09/2008   Cigarette smoker 05/17/2008   Schizophrenia ([place]) 04/30/2008   PCP:  Bernerd Limbo, MD Pharmacy:   [facility], [place], [place] [address], [place] [address] Phone: [phone] Fax: [phone]  [facility], [place] [address] [place] [address] Phone: [phone] Fax: [phone]  [facility] [address] [place] [address] Phone: [phone] Fax: [phone]     Social Determinants of Health (SDOH) Interventions    Readmission Risk Interventions No flowsheet data found.

## 2021-10-01 NOTE — Progress Notes (Signed)
Mobility Specialist: Progress Note   10/01/21 1218  Mobility  Activity Ambulated in hall  Level of Assistance Standby assist, set-up cues, supervision of patient - no hands on  Assistive Device Front wheel walker  Distance Ambulated (ft) 250 ft  Mobility Ambulated with assistance in hallway  Mobility Response Tolerated well  Mobility performed by Mobility specialist  $Mobility charge 1 Mobility   Pre-Mobility: 76 HR, 97% SpO2 Post-Mobility: 87 HR, 95% SpO2  Pt ambulated on 2 L/min [place] with no c/o throughout. Pt back to bed after walk per request with call bell and phone at her side.   [facility] Marybeth Dandy Mobility Specialist Mobility Specialist 4 [place]: [phone] Mobility Specialist 2 [place] and [place]: [phone]

## 2021-10-01 NOTE — Discharge Summary (Signed)
Physician Discharge Summary  [name] JEH:[id number] DOB: Nov 02, 1960 DOA: 09/29/2021  PCP: Bernerd Limbo, MD  Admit date: 09/29/2021 Discharge date: 10/01/2021  Admitted From: home Discharge disposition: home   Recommendations for Outpatient Follow-Up:   Home health Smoking cessation Continue HD   Discharge Diagnosis:   Principal Problem:   Hypervolemia associated with renal insufficiency Active Problems:   Essential hypertension   Heart failure with preserved ejection fraction (HCC)   Affective bipolar disorder (HCC)   COPD with acute exacerbation (HCC)   Dyslipidemia   Hypothyroidism (acquired)   Goals of care, counseling/discussion   Chronic pain disorder    Discharge Condition: Improved.  Diet recommendation: renal  Wound care: None.  Code status: DNR   History of Present Illness:   [name] is a 61 y.o. female with medical history significant of CAD; aortic stenosis s/p distal aortic stent (2019); bipolar/schizophrenia; remote cervical CA; COPD; ESRD on MWF HD; HTN; HLD; IBS; PAD; hypothyroidism; and RLS presenting with SOB.  She was last admitted from 12/19-22 due to acute on chronic respiratory failure from volume overload associated with ESRD, COPD, and anemia.  She got very SOB.  She has been having this issue for a weeks and it really got worse last night.  No significant change in cough, feels like it is caught in her chest.  +CP, more intense than her usual and it won't allow her to breathe.  Significant other was sick prior to onset of symptoms.  +low-grade fevers.  Symptoms are worse with talking and moving around.  +orthopnea.  +PND.  Last HD was Wednesday, denies missed sessions.   Upon further discussion, she developed SOB following initiation of HD.  She has not felt better since starting HD and is miserable all the time.  She is considering stopping HD.  She was given a dose of Ativan in the ER pre-HD after discussion (she  wanted to try this to see if it makes HD more tolerable) and she developed agitation and hallucinations   Hospital Course by Problem:   Hypervolemia associated with renal insufficiency -appears that she has not been able to effectively volume offload and so is hypervolemic -continue HD   COPD with acute exacerbation ([place])- (present on admission) -She is presenting with significant respiratory distress but she is not currently hypoxic -Suspect significant anxious component (see below) -However, she may also have mild exacerbation -She is continuing to "cheat" and periodically smoke -Continue Dulera -Solumedrol -> prednisone -Duonebs -Continue prn Albuterol but change to nebs q2h prn -nicotine patch and smoking cessation counseling   Chronic pain disorder- (present on admission) -resume home meds   Goals of care, counseling/discussion -palliative care consult appreciated   Hypothyroidism (acquired)- (present on admission) -She does not appear to be taking medications for this issue -TSH normal   Dyslipidemia- (present on admission) -Continue Lipitor, Zetia   Affective bipolar disorder ([place])- (present on admission) -She has reported h/o bipolar as well as listed schizophrenia - but is not taking medications for schizophrenia so this diagnosis is suspect -Will continue home Effexor, trazodone, Megace   Heart failure with preserved ejection fraction ([place])- (present on admission) -Generally preserved EF on recent myoview and cath -Volume control with HD   Essential hypertension- (present on admission) -Continue Lopressor, Imdur, Norvasc    Elevated d dimer -unclear reason why ordered in ER and appears no plan for follow up -CT negative for PE      Medical Consultants:  Renal Palliative care   Discharge Exam:   Vitals:   10/01/21 0816 10/01/21 1100  BP:  137/75  Pulse:  74  Resp:  16  Temp:  98 F (36.7 C)  SpO2: 99% 97%   Vitals:   09/30/21 2010  10/01/21 0332 10/01/21 0816 10/01/21 1100  BP:  (!) 152/87  137/75  Pulse:  84  74  Resp:  13  16  Temp:  97.9 F (36.6 C)  98 F (36.7 C)  TempSrc:  Oral  Oral  SpO2: 98% 92% 99% 97%  Weight:  39.6 kg    Height:        General exam: Appears calm and comfortable.   The results of significant diagnostics from this hospitalization (including imaging, microbiology, ancillary and laboratory) are listed below for reference.     Procedures and Diagnostic Studies:   DG Chest Port 1 View  Result Date: 09/29/2021 CLINICAL DATA:  chest pain, SOB EXAM: PORTABLE CHEST 1 VIEW COMPARISON:  09/18/2021 chest radiograph. FINDINGS: Right internal jugular central venous catheter terminates over the cavoatrial junction. Stable cardiomediastinal silhouette with mild cardiomegaly. No pneumothorax. Small bilateral pleural effusions, left greater than right, stable. Cephalization of the pulmonary vasculature without overt pulmonary edema. Patchy bibasilar lung consolidation with associated volume loss, most prominent at the medial lung bases, unchanged. IMPRESSION: 1. Stable cardiomegaly without overt pulmonary edema. 2. Stable small bilateral pleural effusions, left greater than right. 3. Stable patchy bibasilar lung consolidation with associated volume loss, favor atelectasis, difficult to exclude a component of pneumonia. Electronically Signed   By: Ilona Sorrel M.D.   On: 09/29/2021 08:23     Labs:   Basic Metabolic Panel: Recent Labs  Lab 09/29/21 0732 09/30/21 0402 10/01/21 0351  NA 138 134* 132*  K 4.0 3.6 4.1  CL 101 96* 94*  CO2 23 25 27   GLUCOSE 118* 103* 129*  BUN 31* 23* 52*  CREATININE 3.05* 2.27* 3.46*  CALCIUM 9.0 8.7* 8.8*   GFR Estimated Creatinine Clearance: 11.1 mL/min (A) (by C-G formula based on SCr of 3.46 mg/dL (H)). Liver Function Tests: No results for input(s): AST, ALT, ALKPHOS, BILITOT, PROT, ALBUMIN in the last 168 hours. No results for input(s): LIPASE, AMYLASE in  the last 168 hours. No results for input(s): AMMONIA in the last 168 hours. Coagulation profile No results for input(s): INR, PROTIME in the last 168 hours.  CBC: Recent Labs  Lab 09/29/21 0732 09/30/21 0402 10/01/21 0351  WBC 11.0* 6.1 9.2  NEUTROABS 9.9*  --   --   HGB 9.4* 8.7* 9.0*  HCT 30.2* 27.6* 28.5*  MCV 101.3* 97.9 99.0  PLT 295 225 260   Cardiac Enzymes: No results for input(s): CKTOTAL, CKMB, CKMBINDEX, TROPONINI in the last 168 hours. BNP: Invalid input(s): POCBNP CBG: No results for input(s): GLUCAP in the last 168 hours. D-Dimer Recent Labs    09/29/21 1101  DDIMER 3.45*   Hgb A1c No results for input(s): HGBA1C in the last 72 hours. Lipid Profile No results for input(s): CHOL, HDL, LDLCALC, TRIG, CHOLHDL, LDLDIRECT in the last 72 hours. Thyroid function studies Recent Labs    09/29/21 1835  TSH 1.651   Anemia work up No results for input(s): VITAMINB12, FOLATE, FERRITIN, TIBC, IRON, RETICCTPCT in the last 72 hours. Microbiology Recent Results (from the past 240 hour(s))  Resp Panel by RT-PCR (Flu A&B, Covid) Nasopharyngeal Swab     Status: None   Collection Time: 09/29/21  6:53 AM   Specimen: Nasopharyngeal  Swab; Nasopharyngeal(NP) swabs in vial transport medium  Result Value Ref Range Status   SARS Coronavirus 2 by RT PCR NEGATIVE NEGATIVE Final    Comment: (NOTE) SARS-CoV-2 target nucleic acids are NOT DETECTED.  The SARS-CoV-2 RNA is generally detectable in upper respiratory specimens during the acute phase of infection. The lowest concentration of SARS-CoV-2 viral copies this assay can detect is 138 copies/mL. A negative result does not preclude SARS-Cov-2 infection and should not be used as the sole basis for treatment or other patient management decisions. A negative result may occur with  improper specimen collection/handling, submission of specimen other than nasopharyngeal swab, presence of viral mutation(s) within the areas  targeted by this assay, and inadequate number of viral copies(<138 copies/mL). A negative result must be combined with clinical observations, patient history, and epidemiological information. The expected result is Negative.  Fact Sheet for Patients:  EntrepreneurPulse.com.au  Fact Sheet for Healthcare Providers:  IncredibleEmployment.be  This test is no t yet approved or cleared by the [place] FDA and  has been authorized for detection and/or diagnosis of SARS-CoV-2 by FDA under an Emergency Use Authorization (EUA). This EUA will remain  in effect (meaning this test can be used) for the duration of the COVID-19 declaration under Section 564(b)(1) of the Act, 21 U.S.C.section 360bbb-3(b)(1), unless the authorization is terminated  or revoked sooner.       Influenza A by PCR NEGATIVE NEGATIVE Final   Influenza B by PCR NEGATIVE NEGATIVE Final    Comment: (NOTE) The Xpert Xpress SARS-CoV-2/FLU/RSV plus assay is intended as an aid in the diagnosis of influenza from Nasopharyngeal swab specimens and should not be used as a sole basis for treatment. Nasal washings and aspirates are unacceptable for Xpert Xpress SARS-CoV-2/FLU/RSV testing.  Fact Sheet for Patients: EntrepreneurPulse.com.au  Fact Sheet for Healthcare Providers: IncredibleEmployment.be  This test is not yet approved or cleared by the [place] FDA and has been authorized for detection and/or diagnosis of SARS-CoV-2 by FDA under an Emergency Use Authorization (EUA). This EUA will remain in effect (meaning this test can be used) for the duration of the COVID-19 declaration under Section 564(b)(1) of the Act, 21 U.S.C. section 360bbb-3(b)(1), unless the authorization is terminated or revoked.  Performed at [facility], [place] [address] [place], [place] [address]      Discharge Instructions:   Discharge Instructions      Discharge instructions   Complete by: As directed    Home health   Increase activity slowly   Complete by: As directed    No wound care   Complete by: As directed       Allergies as of 10/01/2021       Reactions   Doxycycline Anaphylaxis, Hives   Hydralazine Shortness Of Breath, Rash   Patient reports "couldn't breath"   Aspirin Other (See Comments)   Internal bleeding- MD SAID to not take this   Hydrocodone Nausea And Vomiting   Ibuprofen Other (See Comments)   Caused internal bleeding   Tylenol [acetaminophen] Other (See Comments)   MD told the patient to not take this   Iohexol Itching, Other (See Comments)   Pt has itching nose after iv contrast injection        Medication List     TAKE these medications    albuterol 108 (90 Base) MCG/ACT inhaler Commonly known as: VENTOLIN HFA Inhale 2 puffs into the lungs every 6 (six) hours as needed for wheezing or shortness of breath (asthma).  What changed: Another medication with the same name was added. Make sure you understand how and when to take each.   albuterol (2.5 MG/3ML) 0.083% nebulizer solution Commonly known as: PROVENTIL Take 3 mLs (2.5 mg total) by nebulization every 2 (two) hours as needed for shortness of breath. What changed: You were already taking a medication with the same name, and this prescription was added. Make sure you understand how and when to take each.   ALPRAZolam 1 MG tablet Commonly known as: XANAX Take 1 mg by mouth 2 (two) times daily as needed for anxiety.   amLODipine 10 MG tablet Commonly known as: NORVASC TAKE 1 TABLET BY MOUTH ONCE DAILY   atorvastatin 80 MG tablet Commonly known as: LIPITOR Take 80 mg by mouth at bedtime.   azelastine 0.1 % nasal spray Commonly known as: ASTELIN Place 1 spray into both nostrils daily as needed for allergies (seasonal allergies).   CALCIUM 600 + D PO Take 1 tablet by mouth daily.   calcium acetate 667 MG capsule Commonly known as:  PHOSLO Take 2,001 mg by mouth See admin instructions. Take 2001mg  (3 capsules) by mouth three times a day with meals and 667mg  (1 capsule) with a snack   cetirizine 10 MG tablet Commonly known as: ZYRTEC Take 10 mg by mouth daily as needed for allergies.   Ensure Take 237 mLs by mouth 2 (two) times daily between meals.   ezetimibe 10 MG tablet Commonly known as: ZETIA TAKE 1 TABLET BY MOUTH ONCE A DAY   isosorbide mononitrate 30 MG 24 hr tablet Commonly known as: IMDUR Take 30 mg by mouth daily.   labetalol 100 MG tablet Commonly known as: NORMODYNE Take 100 mg by mouth 2 (two) times daily.   Linzess 72 MCG capsule Generic drug: linaclotide TAKE 1 CAPSULE BY MOUTH ONCE DAILY BEFORE BREAKFAST What changed: See the new instructions.   megestrol 40 MG tablet Commonly known as: MEGACE Take 40 mg by mouth daily.   mometasone-formoterol 100-5 MCG/ACT Aero Commonly known as: DULERA Take 2 puffs first thing in am and then another 2 puffs about 12 hours later. What changed:  how much to take how to take this when to take this additional instructions   multivitamin Tabs tablet Take 1 tablet by mouth at bedtime.   nitroGLYCERIN 0.4 MG SL tablet Commonly known as: NITROSTAT Place 1 tablet (0.4 mg total) under the tongue every 5 (five) minutes as needed for chest pain.   ondansetron 4 MG disintegrating tablet Commonly known as: ZOFRAN-ODT Take 1 tablet (4 mg total) by mouth every 8 (eight) hours as needed for nausea or vomiting. What changed:  when to take this additional instructions   oxyCODONE-acetaminophen 5-325 MG tablet Commonly known as: PERCOCET/ROXICET Take 1 tablet by mouth 2 (two) times daily as needed for pain.   pantoprazole 40 MG tablet Commonly known as: PROTONIX Take 1 tablet (40 mg total) by mouth daily.   polyethylene glycol powder 17 GM/SCOOP powder Commonly known as: MiraLax Take 17 g by mouth 2 (two) times daily as needed for moderate  constipation or mild constipation.   predniSONE 20 MG tablet Commonly known as: DELTASONE Take 2 tablets (40 mg total) by mouth daily with breakfast. Start taking on: October 02, 2021   promethazine 25 MG tablet Commonly known as: PHENERGAN Take 25 mg by mouth daily. Alternate with Ondansetron   traZODone 100 MG tablet Commonly known as: DESYREL Take 50-100 mg by mouth at bedtime as needed for  sleep.   venlafaxine XR 150 MG 24 hr capsule Commonly known as: Effexor XR Take 2 capsules (300 mg total) by mouth daily with breakfast.   vitamin B-12 500 MCG tablet Commonly known as: CYANOCOBALAMIN Take 500 mcg by mouth daily.               Durable Medical Equipment  (From admission, onward)           Start     Ordered   10/01/21 1250  For home use only DME 4 wheeled rolling walker with seat  Once       Question:  Patient needs a walker to treat with the following condition  Answer:  Weakness   10/01/21 1250            Follow-up Information     [facility] Follow up.   Specialty: Home Health Services Why: For home health services. They will contact you in 1-2 days to set up your first home appointment Contact information: [place] [address] [phone]                  Time coordinating discharge: 35 min  Signed:  Geradine Girt DO  [facility] 10/01/2021, 1:54 PM

## 2021-10-01 NOTE — Progress Notes (Signed)
Patient discharging home. Vital signs stable at time of discharge as reflected in discharge summary. Discharge instructions given and verbal understanding returned. Patient discharging with [place]. No questions at this time.

## 2021-10-02 NOTE — TOC Transition Note (Signed)
Transition of care contact from inpatient facility  Date of discharge: 10/01/21 Date of contact: 10/02/21 Method: Phone Spoke to: Patient  Patient contacted to discuss transition of care from recent inpatient hospitalization. Patient was admitted to [facility] from 09/29/21-10/01/21 with discharge diagnosis of acute on chronic respiratory failure/COPD exacerbation.  Medication changes were reviewed.  Patient currently receiving HD at [facility]. Renal team to see patient at next scheduled rounding.  Tobie Poet, NP

## 2021-10-03 ENCOUNTER — Inpatient Hospital Stay (HOSPITAL_COMMUNITY)
Admission: EM | Admit: 2021-10-03 | Discharge: 2021-10-12 | DRG: 304 | Disposition: A | Discharge status: Home Health | Payer: Medicare Other | Attending: Internal Medicine | Admitting: Internal Medicine

## 2021-10-03 ENCOUNTER — Other Ambulatory Visit: Payer: Self-pay

## 2021-10-03 ENCOUNTER — Emergency Department (HOSPITAL_COMMUNITY): Payer: Medicare Other

## 2021-10-03 DIAGNOSIS — I132 Hypertensive heart and chronic kidney disease with heart failure and with stage 5 chronic kidney disease, or end stage renal disease: Secondary | ICD-10-CM | POA: Diagnosis present

## 2021-10-03 DIAGNOSIS — K219 Gastro-esophageal reflux disease without esophagitis: Secondary | ICD-10-CM | POA: Diagnosis present

## 2021-10-03 DIAGNOSIS — Z90710 Acquired absence of both cervix and uterus: Secondary | ICD-10-CM

## 2021-10-03 DIAGNOSIS — Z8541 Personal history of malignant neoplasm of cervix uteri: Secondary | ICD-10-CM

## 2021-10-03 DIAGNOSIS — J441 Chronic obstructive pulmonary disease with (acute) exacerbation: Secondary | ICD-10-CM | POA: Diagnosis present

## 2021-10-03 DIAGNOSIS — Z9981 Dependence on supplemental oxygen: Secondary | ICD-10-CM | POA: Diagnosis not present

## 2021-10-03 DIAGNOSIS — F419 Anxiety disorder, unspecified: Secondary | ICD-10-CM | POA: Diagnosis present

## 2021-10-03 DIAGNOSIS — Z66 Do not resuscitate: Secondary | ICD-10-CM | POA: Diagnosis present

## 2021-10-03 DIAGNOSIS — Z85828 Personal history of other malignant neoplasm of skin: Secondary | ICD-10-CM | POA: Diagnosis not present

## 2021-10-03 DIAGNOSIS — G2581 Restless legs syndrome: Secondary | ICD-10-CM | POA: Diagnosis present

## 2021-10-03 DIAGNOSIS — J9621 Acute and chronic respiratory failure with hypoxia: Secondary | ICD-10-CM | POA: Diagnosis present

## 2021-10-03 DIAGNOSIS — I739 Peripheral vascular disease, unspecified: Secondary | ICD-10-CM | POA: Diagnosis present

## 2021-10-03 DIAGNOSIS — I4891 Unspecified atrial fibrillation: Secondary | ICD-10-CM | POA: Diagnosis not present

## 2021-10-03 DIAGNOSIS — I2510 Atherosclerotic heart disease of native coronary artery without angina pectoris: Secondary | ICD-10-CM | POA: Diagnosis present

## 2021-10-03 DIAGNOSIS — R059 Cough, unspecified: Secondary | ICD-10-CM

## 2021-10-03 DIAGNOSIS — J90 Pleural effusion, not elsewhere classified: Secondary | ICD-10-CM | POA: Diagnosis present

## 2021-10-03 DIAGNOSIS — N2581 Secondary hyperparathyroidism of renal origin: Secondary | ICD-10-CM | POA: Diagnosis present

## 2021-10-03 DIAGNOSIS — Z800 Family history of malignant neoplasm of digestive organs: Secondary | ICD-10-CM

## 2021-10-03 DIAGNOSIS — Z20822 Contact with and (suspected) exposure to covid-19: Secondary | ICD-10-CM | POA: Diagnosis present

## 2021-10-03 DIAGNOSIS — I160 Hypertensive urgency: Secondary | ICD-10-CM | POA: Diagnosis present

## 2021-10-03 DIAGNOSIS — Z992 Dependence on renal dialysis: Secondary | ICD-10-CM

## 2021-10-03 DIAGNOSIS — E43 Unspecified severe protein-calorie malnutrition: Secondary | ICD-10-CM | POA: Diagnosis present

## 2021-10-03 DIAGNOSIS — Z681 Body mass index (BMI) 19 or less, adult: Secondary | ICD-10-CM | POA: Diagnosis not present

## 2021-10-03 DIAGNOSIS — J810 Acute pulmonary edema: Secondary | ICD-10-CM

## 2021-10-03 DIAGNOSIS — I272 Pulmonary hypertension, unspecified: Secondary | ICD-10-CM | POA: Diagnosis present

## 2021-10-03 DIAGNOSIS — Z7952 Long term (current) use of systemic steroids: Secondary | ICD-10-CM

## 2021-10-03 DIAGNOSIS — I161 Hypertensive emergency: Secondary | ICD-10-CM | POA: Diagnosis present

## 2021-10-03 DIAGNOSIS — Z833 Family history of diabetes mellitus: Secondary | ICD-10-CM

## 2021-10-03 DIAGNOSIS — Z87891 Personal history of nicotine dependence: Secondary | ICD-10-CM

## 2021-10-03 DIAGNOSIS — D62 Acute posthemorrhagic anemia: Secondary | ICD-10-CM | POA: Diagnosis present

## 2021-10-03 DIAGNOSIS — E7800 Pure hypercholesterolemia, unspecified: Secondary | ICD-10-CM | POA: Diagnosis present

## 2021-10-03 DIAGNOSIS — N186 End stage renal disease: Secondary | ICD-10-CM | POA: Diagnosis present

## 2021-10-03 DIAGNOSIS — G894 Chronic pain syndrome: Secondary | ICD-10-CM | POA: Diagnosis present

## 2021-10-03 DIAGNOSIS — F319 Bipolar disorder, unspecified: Secondary | ICD-10-CM | POA: Diagnosis present

## 2021-10-03 DIAGNOSIS — Z8051 Family history of malignant neoplasm of kidney: Secondary | ICD-10-CM

## 2021-10-03 DIAGNOSIS — Z8249 Family history of ischemic heart disease and other diseases of the circulatory system: Secondary | ICD-10-CM

## 2021-10-03 DIAGNOSIS — D631 Anemia in chronic kidney disease: Secondary | ICD-10-CM | POA: Diagnosis present

## 2021-10-03 DIAGNOSIS — E559 Vitamin D deficiency, unspecified: Secondary | ICD-10-CM | POA: Diagnosis present

## 2021-10-03 DIAGNOSIS — Z79899 Other long term (current) drug therapy: Secondary | ICD-10-CM

## 2021-10-03 DIAGNOSIS — R0600 Dyspnea, unspecified: Secondary | ICD-10-CM

## 2021-10-03 DIAGNOSIS — Z7951 Long term (current) use of inhaled steroids: Secondary | ICD-10-CM

## 2021-10-03 LAB — CBC WITH DIFFERENTIAL/PLATELET
Abs Immature Granulocytes: 0.06 10*3/uL (ref 0.00–0.07)
Basophils Absolute: 0 10*3/uL (ref 0.0–0.1)
Basophils Relative: 0 %
Eosinophils Absolute: 0 10*3/uL (ref 0.0–0.5)
Eosinophils Relative: 0 %
HCT: 26.7 % — ABNORMAL LOW (ref 36.0–46.0)
Hemoglobin: 8.4 g/dL — ABNORMAL LOW (ref 12.0–15.0)
Immature Granulocytes: 0 %
Lymphocytes Relative: 2 %
Lymphs Abs: 0.2 10*3/uL — ABNORMAL LOW (ref 0.7–4.0)
MCH: 30.8 pg (ref 26.0–34.0)
MCHC: 31.5 g/dL (ref 30.0–36.0)
MCV: 97.8 fL (ref 80.0–100.0)
Monocytes Absolute: 0.2 10*3/uL (ref 0.1–1.0)
Monocytes Relative: 2 %
Neutro Abs: 13.5 10*3/uL — ABNORMAL HIGH (ref 1.7–7.7)
Neutrophils Relative %: 96 %
Platelets: 291 10*3/uL (ref 150–400)
RBC: 2.73 MIL/uL — ABNORMAL LOW (ref 3.87–5.11)
RDW: 18.8 % — ABNORMAL HIGH (ref 11.5–15.5)
WBC: 14.1 10*3/uL — ABNORMAL HIGH (ref 4.0–10.5)
nRBC: 0 % (ref 0.0–0.2)

## 2021-10-03 LAB — I-STAT VENOUS BLOOD GAS, ED
Acid-Base Excess: 7 mmol/L — ABNORMAL HIGH (ref 0.0–2.0)
Bicarbonate: 30.7 mmol/L — ABNORMAL HIGH (ref 20.0–28.0)
Calcium, Ion: 1.18 mmol/L (ref 1.15–1.40)
HCT: 27 % — ABNORMAL LOW (ref 36.0–46.0)
Hemoglobin: 9.2 g/dL — ABNORMAL LOW (ref 12.0–15.0)
O2 Saturation: 80 %
Potassium: 4.4 mmol/L (ref 3.5–5.1)
Sodium: 133 mmol/L — ABNORMAL LOW (ref 135–145)
TCO2: 32 mmol/L (ref 22–32)
pCO2, Ven: 39.1 mmHg — ABNORMAL LOW (ref 44.0–60.0)
pH, Ven: 7.503 — ABNORMAL HIGH (ref 7.250–7.430)
pO2, Ven: 41 mmHg (ref 32.0–45.0)

## 2021-10-03 LAB — COMPREHENSIVE METABOLIC PANEL WITH GFR
ALT: 55 U/L — ABNORMAL HIGH (ref 0–44)
AST: 41 U/L (ref 15–41)
Albumin: 3.5 g/dL (ref 3.5–5.0)
Alkaline Phosphatase: 158 U/L — ABNORMAL HIGH (ref 38–126)
Anion gap: 13 (ref 5–15)
BUN: 42 mg/dL — ABNORMAL HIGH (ref 6–20)
CO2: 29 mmol/L (ref 22–32)
Calcium: 9.6 mg/dL (ref 8.9–10.3)
Chloride: 94 mmol/L — ABNORMAL LOW (ref 98–111)
Creatinine, Ser: 2.77 mg/dL — ABNORMAL HIGH (ref 0.44–1.00)
GFR, Estimated: 19 mL/min — ABNORMAL LOW
Glucose, Bld: 114 mg/dL — ABNORMAL HIGH (ref 70–99)
Potassium: 4.5 mmol/L (ref 3.5–5.1)
Sodium: 136 mmol/L (ref 135–145)
Total Bilirubin: 1.2 mg/dL (ref 0.3–1.2)
Total Protein: 6.4 g/dL — ABNORMAL LOW (ref 6.5–8.1)

## 2021-10-03 LAB — TROPONIN I (HIGH SENSITIVITY): Troponin I (High Sensitivity): 60 ng/L — ABNORMAL HIGH (ref <18)

## 2021-10-03 MED ORDER — ISOSORBIDE MONONITRATE ER 30 MG PO TB24
30.0000 mg | ORAL_TABLET | Freq: Every day | ORAL | Status: DC
  Administered 2021-10-04 – 2021-10-05 (×2): 30 mg via ORAL
  Filled 2021-10-03 (×2): qty 1

## 2021-10-03 MED ORDER — MEGESTROL ACETATE 40 MG PO TABS
40.0000 mg | ORAL_TABLET | Freq: Every day | ORAL | Status: DC
  Administered 2021-10-04 – 2021-10-12 (×9): 40 mg via ORAL
  Filled 2021-10-03 (×10): qty 1

## 2021-10-03 MED ORDER — FUROSEMIDE 10 MG/ML IJ SOLN: 40.0000 mg | Freq: Once | INTRAMUSCULAR | Status: DC

## 2021-10-03 MED ORDER — VENLAFAXINE HCL ER 150 MG PO CP24
300.0000 mg | ORAL_CAPSULE | Freq: Every day | ORAL | Status: DC
  Administered 2021-10-04 – 2021-10-12 (×9): 300 mg via ORAL
  Filled 2021-10-03 (×11): qty 2

## 2021-10-03 MED ORDER — POLYETHYLENE GLYCOL 3350 17 G PO PACK: 17.0000 g | PACK | Freq: Every day | ORAL | Status: DC | PRN

## 2021-10-03 MED ORDER — OXYCODONE-ACETAMINOPHEN 5-325 MG PO TABS
1.0000 | ORAL_TABLET | Freq: Two times a day (BID) | ORAL | Status: DC | PRN
Start: 2021-10-03 — End: 2021-10-06
  Administered 2021-10-04 – 2021-10-05 (×5): 1 via ORAL
  Filled 2021-10-03 (×6): qty 1

## 2021-10-03 MED ORDER — VITAMIN B-12 100 MCG PO TABS
500.0000 ug | ORAL_TABLET | Freq: Every day | ORAL | Status: DC
  Administered 2021-10-04 – 2021-10-12 (×9): 500 ug via ORAL
  Filled 2021-10-03: qty 5
  Filled 2021-10-03 (×4): qty 1
  Filled 2021-10-03: qty 5
  Filled 2021-10-03 (×2): qty 1
  Filled 2021-10-03: qty 5

## 2021-10-03 MED ORDER — LINACLOTIDE 72 MCG PO CAPS
72.0000 ug | ORAL_CAPSULE | Freq: Every day | ORAL | Status: DC
  Administered 2021-10-04 – 2021-10-12 (×9): 72 ug via ORAL
  Filled 2021-10-03 (×11): qty 1

## 2021-10-03 MED ORDER — CALCIUM ACETATE (PHOS BINDER) 667 MG PO CAPS: 2001.0000 mg | ORAL_CAPSULE | ORAL | Status: DC

## 2021-10-03 MED ORDER — AZELASTINE HCL 0.1 % NA SOLN: 1.0000 | Freq: Every day | NASAL | Status: DC | PRN

## 2021-10-03 MED ORDER — CALCIUM CARB-CHOLECALCIFEROL 600-5 MG-MCG PO TABS: 1.0000 | ORAL_TABLET | Freq: Every day | ORAL | Status: DC

## 2021-10-03 MED ORDER — EZETIMIBE 10 MG PO TABS
10.0000 mg | ORAL_TABLET | Freq: Every day | ORAL | Status: DC
  Administered 2021-10-04 – 2021-10-12 (×9): 10 mg via ORAL
  Filled 2021-10-03 (×10): qty 1

## 2021-10-03 MED ORDER — NICOTINE 7 MG/24HR TD PT24
7.0000 mg | MEDICATED_PATCH | Freq: Every day | TRANSDERMAL | Status: AC
  Administered 2021-10-04 – 2021-10-06 (×3): 7 mg via TRANSDERMAL
  Filled 2021-10-03 (×3): qty 1

## 2021-10-03 MED ORDER — ALBUTEROL SULFATE (2.5 MG/3ML) 0.083% IN NEBU
2.5000 mg | INHALATION_SOLUTION | RESPIRATORY_TRACT | Status: DC | PRN
  Administered 2021-10-06 – 2021-10-08 (×2): 2.5 mg via RESPIRATORY_TRACT
  Filled 2021-10-03 (×2): qty 3

## 2021-10-03 MED ORDER — POLYETHYLENE GLYCOL 3350 17 GM/SCOOP PO POWD: 17.0000 g | Freq: Two times a day (BID) | ORAL | Status: DC | PRN

## 2021-10-03 MED ORDER — ALBUTEROL SULFATE HFA 108 (90 BASE) MCG/ACT IN AERS: 2.0000 | INHALATION_SPRAY | Freq: Four times a day (QID) | RESPIRATORY_TRACT | Status: DC | PRN

## 2021-10-03 MED ORDER — HEPARIN SODIUM (PORCINE) 5000 UNIT/ML IJ SOLN
5000.0000 [IU] | Freq: Three times a day (TID) | INTRAMUSCULAR | Status: DC
  Administered 2021-10-04 – 2021-10-07 (×10): 5000 [IU] via SUBCUTANEOUS
  Filled 2021-10-03 (×10): qty 1

## 2021-10-03 MED ORDER — ALPRAZOLAM 0.5 MG PO TABS
1.0000 mg | ORAL_TABLET | Freq: Two times a day (BID) | ORAL | Status: DC | PRN
  Administered 2021-10-04 – 2021-10-11 (×13): 1 mg via ORAL
  Filled 2021-10-03 (×14): qty 2

## 2021-10-03 MED ORDER — ATORVASTATIN CALCIUM 80 MG PO TABS
80.0000 mg | ORAL_TABLET | Freq: Every day | ORAL | Status: DC
  Administered 2021-10-04 – 2021-10-11 (×8): 80 mg via ORAL
  Filled 2021-10-03 (×8): qty 1

## 2021-10-03 MED ORDER — DOCUSATE SODIUM 100 MG PO CAPS: 100.0000 mg | ORAL_CAPSULE | Freq: Two times a day (BID) | ORAL | Status: DC | PRN

## 2021-10-03 MED ORDER — AMLODIPINE BESYLATE 10 MG PO TABS: 10.0000 mg | ORAL_TABLET | Freq: Every day | ORAL | Status: DC

## 2021-10-03 MED ORDER — BOOST / RESOURCE BREEZE PO LIQD CUSTOM
237.0000 mL | Freq: Two times a day (BID) | ORAL | Status: DC
  Filled 2021-10-03: qty 1

## 2021-10-03 MED ORDER — LORATADINE 10 MG PO TABS
10.0000 mg | ORAL_TABLET | Freq: Every day | ORAL | Status: DC
  Administered 2021-10-04 – 2021-10-12 (×9): 10 mg via ORAL
  Filled 2021-10-03 (×9): qty 1

## 2021-10-03 MED ORDER — CALCIUM ACETATE (PHOS BINDER) 667 MG PO CAPS: 2001.0000 mg | ORAL_CAPSULE | Freq: Three times a day (TID) | ORAL | Status: DC

## 2021-10-03 MED ORDER — PROMETHAZINE HCL 25 MG PO TABS
25.0000 mg | ORAL_TABLET | Freq: Four times a day (QID) | ORAL | Status: DC | PRN
  Filled 2021-10-03: qty 1

## 2021-10-03 MED ORDER — TRAZODONE HCL 50 MG PO TABS
50.0000 mg | ORAL_TABLET | Freq: Every evening | ORAL | Status: DC | PRN
  Administered 2021-10-04: 20:00:00 50 mg via ORAL
  Administered 2021-10-06 – 2021-10-11 (×5): 100 mg via ORAL
  Filled 2021-10-03 (×2): qty 2
  Filled 2021-10-03: qty 1
  Filled 2021-10-03 (×3): qty 2

## 2021-10-03 MED ORDER — NITROGLYCERIN IN D5W 200-5 MCG/ML-% IV SOLN
0.0000 ug/min | INTRAVENOUS | Status: DC
  Administered 2021-10-03: 5 ug/min via INTRAVENOUS
  Filled 2021-10-03: qty 250

## 2021-10-03 MED ORDER — CALCIUM ACETATE (PHOS BINDER) 667 MG PO CAPS: 667.0000 mg | ORAL_CAPSULE | ORAL | Status: DC

## 2021-10-03 MED ORDER — RENA-VITE PO TABS
1.0000 | ORAL_TABLET | Freq: Every day | ORAL | Status: DC
  Administered 2021-10-04 – 2021-10-11 (×8): 1 via ORAL
  Filled 2021-10-03 (×9): qty 1

## 2021-10-03 MED ORDER — PANTOPRAZOLE SODIUM 40 MG PO TBEC
40.0000 mg | DELAYED_RELEASE_TABLET | Freq: Every day | ORAL | Status: DC
  Administered 2021-10-04 – 2021-10-12 (×9): 40 mg via ORAL
  Filled 2021-10-03 (×9): qty 1

## 2021-10-03 MED ORDER — MOMETASONE FURO-FORMOTEROL FUM 100-5 MCG/ACT IN AERO
2.0000 | INHALATION_SPRAY | Freq: Two times a day (BID) | RESPIRATORY_TRACT | Status: DC
  Filled 2021-10-03: qty 8.8

## 2021-10-03 MED ORDER — ONDANSETRON 4 MG PO TBDP
4.0000 mg | ORAL_TABLET | Freq: Three times a day (TID) | ORAL | Status: DC | PRN
  Filled 2021-10-03: qty 1

## 2021-10-03 NOTE — ED Provider Notes (Signed)
[facility] EMERGENCY DEPARTMENT Provider Note   CSN: [id number] Arrival date & time: 10/03/21  2011     History  Chief Complaint  Patient presents with   Respiratory Distress    [name] is a 61 y.o. female with PMH CAD, aortic stenosis status post aortic stenting, bipolar schizophrenia, ESRD on hemodialysis Monday Wednesday Friday, HTN, HLD who presents the emergency department for evaluation of shortness of breath.  Patient just discharged on 10/01/2021 after suffering a COPD exacerbation.  Patient arrives on CPAP by EMS and is unable to provide further history due to severe respiratory distress.  HPI     Home Medications Prior to Admission medications   Medication Sig Start Date End Date Taking? Authorizing Provider  albuterol (PROVENTIL) (2.5 MG/3ML) 0.083% nebulizer solution Take 3 mLs (2.5 mg total) by nebulization every 2 (two) hours as needed for shortness of breath. 10/01/21   Geradine Girt, DO  albuterol (VENTOLIN HFA) 108 (90 Base) MCG/ACT inhaler Inhale 2 puffs into the lungs every 6 (six) hours as needed for wheezing or shortness of breath (asthma). 08/22/21   Aline August, MD  ALPRAZolam Duanne Moron) 1 MG tablet Take 1 mg by mouth 2 (two) times daily as needed for anxiety. 04/15/17   [provider]  amLODipine (NORVASC) 10 MG tablet TAKE 1 TABLET BY MOUTH ONCE DAILY Patient taking differently: Take 10 mg by mouth daily. 04/13/21   Cantwell, Celeste C, PA-C  atorvastatin (LIPITOR) 80 MG tablet Take 80 mg by mouth at bedtime.    [provider]  azelastine (ASTELIN) 0.1 % nasal spray Place 1 spray into both nostrils daily as needed for allergies (seasonal allergies). 06/24/19   [provider]  calcium acetate (PHOSLO) 667 MG capsule Take 2,001 mg by mouth See admin instructions. Take 2052m (3 capsules) by mouth three times a day with meals and 6647m(1 capsule) with a snack    [provider]  Calcium  Carb-Cholecalciferol (CALCIUM 600 + D PO) Take 1 tablet by mouth daily.    [provider]  cetirizine (ZYRTEC) 10 MG tablet Take 10 mg by mouth daily as needed for allergies.    [provider]  Ensure (ENSURE) Take 237 mLs by mouth 2 (two) times daily between meals.    [provider]  ezetimibe (ZETIA) 10 MG tablet TAKE 1 TABLET BY MOUTH ONCE A DAY Patient taking differently: Take 10 mg by mouth daily. 05/03/21   Cantwell, Celeste C, PA-C  isosorbide mononitrate (IMDUR) 30 MG 24 hr tablet Take 30 mg by mouth daily.    [provider]  labetalol (NORMODYNE) 100 MG tablet Take 100 mg by mouth 2 (two) times daily.    [provider]  LINZESS 72 MCG capsule TAKE 1 CAPSULE BY MOUTH ONCE DAILY BEFORE BREAKFAST Patient taking differently: Take 72 mcg by mouth daily before breakfast. 08/03/21   LeLevin ErpPA  megestrol (MEGACE) 40 MG tablet Take 40 mg by mouth daily.    [provider]  mometasone-formoterol (DULERA) 100-5 MCG/ACT AERO Take 2 puffs first thing in am and then another 2 puffs about 12 hours later. Patient taking differently: Inhale 2 puffs into the lungs every 12 (twelve) hours. 04/26/17   WeTanda RockersMD  multivitamin (RENA-VIT) TABS tablet Take 1 tablet by mouth at bedtime. 09/21/21   GoMercy RidingMD  nitroGLYCERIN (NITROSTAT) 0.4 MG SL tablet Place 1 tablet (0.4 mg total) under the tongue every 5 (five)  minutes as needed for chest pain. 10/15/19   Miquel Dunn, NP  ondansetron (ZOFRAN-ODT) 4 MG disintegrating tablet Take 1 tablet (4 mg total) by mouth every 8 (eight) hours as needed for nausea or vomiting. Patient taking differently: Take 4 mg by mouth daily. Alternate with Promethazine 07/25/21   Hayden Rasmussen, MD  oxyCODONE-acetaminophen (PERCOCET/ROXICET) 5-325 MG tablet Take 1 tablet by mouth 2 (two) times daily as needed for pain. 09/13/21 09/13/22  [provider]  pantoprazole (PROTONIX) 40  MG tablet Take 1 tablet (40 mg total) by mouth daily. 08/22/21   Aline August, MD  polyethylene glycol powder (MIRALAX) 17 GM/SCOOP powder Take 17 g by mouth 2 (two) times daily as needed for moderate constipation or mild constipation. 09/21/21   Mercy Riding, MD  predniSONE (DELTASONE) 20 MG tablet Take 2 tablets (40 mg total) by mouth daily with breakfast. 10/02/21   Geradine Girt, DO  promethazine (PHENERGAN) 25 MG tablet Take 25 mg by mouth daily. Alternate with Ondansetron 08/16/20   [provider]  traZODone (DESYREL) 100 MG tablet Take 50-100 mg by mouth at bedtime as needed for sleep.    [provider]  venlafaxine XR (EFFEXOR XR) 150 MG 24 hr capsule Take 2 capsules (300 mg total) by mouth daily with breakfast. 05/12/21   Arrien, Jimmy Picket, MD  vitamin B-12 (CYANOCOBALAMIN) 500 MCG tablet Take 500 mcg by mouth daily.    [provider]      Allergies    Doxycycline, Hydralazine, Aspirin, Hydrocodone, Ibuprofen, Tylenol [acetaminophen], and Iohexol    Review of Systems   Review of Systems  Respiratory:  Positive for shortness of breath.    Physical Exam Updated Vital Signs BP (!) 185/92    Pulse 86    Resp (!) 26    SpO2 96%  Physical Exam Vitals and nursing note reviewed.  Constitutional:      General: She is not in acute distress.    Appearance: She is well-developed. She is ill-appearing.  HENT:     Head: Normocephalic and atraumatic.  Eyes:     Conjunctiva/sclera: Conjunctivae normal.  Cardiovascular:     Rate and Rhythm: Normal rate and regular rhythm.     Heart sounds: No murmur heard. Pulmonary:     Effort: Respiratory distress present.     Breath sounds: Rales present.  Abdominal:     Palpations: Abdomen is soft.     Tenderness: There is no abdominal tenderness.  Musculoskeletal:        General: No swelling.     Cervical back: Neck supple.  Skin:    General: Skin is warm and dry.     Capillary Refill: Capillary refill  takes less than 2 seconds.  Neurological:     Mental Status: She is alert.  Psychiatric:        Mood and Affect: Mood normal.    ED Results / Procedures / Treatments   Labs (all labs ordered are listed, but only abnormal results are displayed) Labs Reviewed  CBC WITH DIFFERENTIAL/PLATELET - Abnormal; Notable for the following components:      Result Value   WBC 14.1 (*)    RBC 2.73 (*)    Hemoglobin 8.4 (*)    HCT 26.7 (*)    RDW 18.8 (*)    Neutro Abs 13.5 (*)    Lymphs Abs 0.2 (*)    All other components within normal limits  I-STAT VENOUS BLOOD GAS, ED - Abnormal; Notable  for the following components:   pH, Ven 7.503 (*)    pCO2, Ven 39.1 (*)    Bicarbonate 30.7 (*)    Acid-Base Excess 7.0 (*)    Sodium 133 (*)    HCT 27.0 (*)    Hemoglobin 9.2 (*)    All other components within normal limits  COMPREHENSIVE METABOLIC PANEL  BLOOD GAS, VENOUS  TROPONIN I (HIGH SENSITIVITY)    EKG None  Radiology DG Chest Portable 1 View  Result Date: 10/03/2021 CLINICAL DATA:  Dyspnea, COPD exacerbation EXAM: PORTABLE CHEST 1 VIEW COMPARISON:  09/29/2021 FINDINGS: Single frontal view of the chest demonstrates stable right internal jugular dialysis catheter. Cardiac silhouette is enlarged but stable. Increased central vascular congestion, as well as progressive interstitial and ground-glass opacities, bibasilar consolidation, and bilateral pleural effusions. No pneumothorax. IMPRESSION: 1. Findings most consistent with worsening volume status and progressive pulmonary edema. Electronically Signed   By: Randa Ngo M.D.   On: 10/03/2021 20:40    Procedures .Critical Care Performed by: Teressa Lower, MD Authorized by: Teressa Lower, MD   Critical care provider statement:    Critical care time (minutes):  30   Critical care was necessary to treat or prevent imminent or life-threatening deterioration of the following conditions:  Respiratory failure   Critical care was time spent  personally by me on the following activities:  Development of treatment plan with patient or surrogate, discussions with consultants, evaluation of patient's response to treatment, examination of patient, ordering and review of laboratory studies, ordering and review of radiographic studies, ordering and performing treatments and interventions, pulse oximetry, re-evaluation of patient's condition and review of old charts    Medications Ordered in ED Medications  nitroGLYCERIN 50 mg in dextrose 5 % 250 mL (0.2 mg/mL) infusion (5 mcg/min Intravenous New Bag/Given 10/03/21 2103)    ED Course/ Medical Decision Making/ A&P                           Medical Decision Making  Patient seen emergency department for evaluation of respiratory distress.  Physical exam reveals an ill-appearing patient with accessory muscle use and tachypnea on CPAP.  Pulmonary exam with rales at bilateral bases.  Initial blood gas with pH of 7.50 with a PCO2 of 39.1, CMP with a creatinine of 2.77, BUN 42, alk phos elevated to 158 ALT 55, CBC with a ptosis to 14.1, hemoglobin 8.4.  Troponin elevated to 60 likely type II demand ischemia in the setting of respiratory distress.  Patient received a chest x-ray that shows progressive pulmonary edema.  I personally reviewed this chest x-ray and this was confirmed by radiology.  Patient with initial systolics greater than 974 and concern for flash pulm edema.  Thus patient was started on a nitro drip and the patient was admitted to critical care for fluid overload.         Final Clinical Impression(s) / ED Diagnoses Final diagnoses:  None    Rx / DC Orders ED Discharge Orders     None         Alonnah Lampkins, Debe Coder, MD 10/04/21 [phone]

## 2021-10-03 NOTE — ED Triage Notes (Signed)
Pt presents w/ SOB x1 day. Pt was just discharged on 1/1 for COPD exacerbation. Pt wears 2LNC at baseline, on CPAP when BIB EMS.

## 2021-10-03 NOTE — Consult Note (Signed)
ESRD Consult Note  Assessment/Recommendations:  # ESRD:  -outpatient orders: MWF GO  3h 36min  40.8kg  3K/2.5 bath  Hep none  TDC/ LUA AVG done 09/14/21  - venofer 100 q HD thru 1/20 -arranging for HD tonight--discussed with staff earlier  # AHRF, Pulmonary edema, underlying COPD -UF as tolerated, on nitro gtt, will need new EDW  # Volume/ hypertensive urgency: EDW 40.8kg originally, post tx wt on 12/30 was 37.8kg, can challenge further -resume home anti-HTNs  # Anemia of Chronic Kidney Disease: Hemoglobin 9.2. continue with fe load  # Secondary Hyperparathyroidism/Hyperphosphatemia: resume home binders, check phos   # Vascular access: RIJ TDC c/d/I, lue avf +b/t  # Additional recommendations: - Dose all meds for creatinine clearance < 10 ml/min  - Unless absolutely necessary, no MRIs with gadolinium.  - Implement save arm precautions.  Prefer needle sticks in the dorsum of the hands or wrists.  No blood pressure measurements in arm. - If blood transfusion is requested during hemodialysis sessions, please alert [place] prior to the session.  - If a hemodialysis catheter line culture is requested, please alert [place] as only hemodialysis nurses are able to collect those specimens.   Gean Quint, MD [facility]  History of Present Illness: [name] is a/an 61 y.o. female with a past medical history of ESRD on HD, hypertension, hyperlipidemia, COPD, h/o AS, chronic hypoxic respiratory failure on chronic 2 L nasal cannula who presents with shortness of breath.  She was recently admitted here from 12/30 to 10/01/2021 for dyspnea likely related to volume overload and possible COPD exacerbation.  She was discharged on 1/1, she did go to her HD unit yesterday on 1/2.  The plan previously was to adjust her dry weight further, however it seems that her dry weight was the same as it was before at yesterday's HD session. Chest x-ray here was consistent with pulmonary edema.  She was  also found to be in hypertensive urgency with systolic blood pressures up in the 180s.  She was placed on BiPAP and nitroglycerin drip.  She currently feels slightly better in regards to her breathing. Plan is to do HD tonight. Denies any fevers, chills, chest pain, swelling.   Medications:  Current Facility-Administered Medications  Medication Dose Route Frequency Provider Last Rate Last Admin   albuterol (PROVENTIL) (2.5 MG/3ML) 0.083% nebulizer solution 2.5 mg  2.5 mg Nebulization Q2H PRN Desai, Rahul P, PA-C       ALPRAZolam Duanne Moron) tablet 1 mg  1 mg Oral BID PRN Shearon Stalls, Rahul P, PA-C       amLODipine (NORVASC) tablet 10 mg  10 mg Oral Daily Desai, Rahul P, PA-C       atorvastatin (LIPITOR) tablet 80 mg  80 mg Oral QHS Desai, Rahul P, PA-C       azelastine (ASTELIN) 0.1 % nasal spray 1 spray  1 spray Each Nare Daily PRN Shearon Stalls, Rahul P, PA-C       [START ON 10/04/2021] calcium acetate (PHOSLO) capsule 2,001 mg  2,001 mg Oral TID WC Alvan Dame, MD       And   [START ON 10/04/2021] calcium acetate (PHOSLO) capsule 667 mg  667 mg Oral With snacks Zachery Conch A, MD       docusate sodium (COLACE) capsule 100 mg  100 mg Oral BID PRN Shearon Stalls, Rahul P, PA-C       ezetimibe (ZETIA) tablet 10 mg  10 mg Oral Daily Desai, Rahul P, PA-C       [  START ON 10/04/2021] feeding supplement (BOOST / RESOURCE BREEZE) liquid 1 Container  237 mL Oral BID BM Desai, Rahul P, PA-C       heparin injection 5,000 Units  5,000 Units Subcutaneous Q8H Desai, Rahul P, PA-C       isosorbide mononitrate (IMDUR) 24 hr tablet 30 mg  30 mg Oral Daily Desai, Rahul P, PA-C       [START ON 10/04/2021] linaclotide (LINZESS) capsule 72 mcg  72 mcg Oral QAC breakfast Desai, Rahul P, PA-C       loratadine (CLARITIN) tablet 10 mg  10 mg Oral Daily Desai, Rahul P, PA-C       megestrol (MEGACE) tablet 40 mg  40 mg Oral Daily Desai, Rahul P, PA-C       mometasone-formoterol (DULERA) 100-5 MCG/ACT inhaler 2 puff  2 puff Inhalation Q12H Desai,  Rahul P, PA-C       multivitamin (RENA-VIT) tablet 1 tablet  1 tablet Oral QHS Desai, Rahul P, PA-C       [START ON 10/04/2021] nicotine (NICODERM CQ - dosed in mg/24 hr) patch 7 mg  7 mg Transdermal Daily Desai, Rahul P, PA-C       nitroGLYCERIN 50 mg in dextrose 5 % 250 mL (0.2 mg/mL) infusion  0-200 mcg/min Intravenous Continuous Kommor, [place], MD 15 mL/hr at 10/03/21 2153 50 mcg/min at 10/03/21 2153   ondansetron (ZOFRAN-ODT) disintegrating tablet 4 mg  4 mg Oral Q8H PRN Desai, Rahul P, PA-C       oxyCODONE-acetaminophen (PERCOCET/ROXICET) 5-325 MG per tablet 1 tablet  1 tablet Oral BID PRN Shearon Stalls, Rahul P, PA-C       pantoprazole (PROTONIX) EC tablet 40 mg  40 mg Oral Daily Desai, Rahul P, PA-C       polyethylene glycol (MIRALAX / GLYCOLAX) packet 17 g  17 g Oral Daily PRN Desai, Rahul P, PA-C       promethazine (PHENERGAN) tablet 25 mg  25 mg Oral Q6H PRN Desai, Rahul P, PA-C       traZODone (DESYREL) tablet 50-100 mg  50-100 mg Oral QHS PRN Shearon Stalls, Rahul P, PA-C       [START ON 10/04/2021] venlafaxine XR (EFFEXOR-XR) 24 hr capsule 300 mg  300 mg Oral Q breakfast Desai, Rahul P, PA-C       vitamin B-12 (CYANOCOBALAMIN) tablet 500 mcg  500 mcg Oral Daily Desai, Rahul P, PA-C       Current Outpatient Medications  Medication Sig Dispense Refill   albuterol (PROVENTIL) (2.5 MG/3ML) 0.083% nebulizer solution Take 3 mLs (2.5 mg total) by nebulization every 2 (two) hours as needed for shortness of breath. 75 mL 0   ALPRAZolam (XANAX) 1 MG tablet Take 1 mg by mouth 2 (two) times daily as needed for anxiety.     amLODipine (NORVASC) 10 MG tablet TAKE 1 TABLET BY MOUTH ONCE DAILY (Patient taking differently: Take 10 mg by mouth daily.) 90 tablet 1   atorvastatin (LIPITOR) 80 MG tablet Take 80 mg by mouth at bedtime.     calcium acetate (PHOSLO) 667 MG capsule Take 2,001 mg by mouth See admin instructions. Take 2001mg  (3 capsules) by mouth three times a day with meals and 667mg  (1 capsule) with a snack      Calcium Carb-Cholecalciferol (CALCIUM 600 + D PO) Take 1 tablet by mouth daily.     cetirizine (ZYRTEC) 10 MG tablet Take 10 mg by mouth daily as needed for allergies.     Ensure (ENSURE) Take  237 mLs by mouth 2 (two) times daily between meals.     ezetimibe (ZETIA) 10 MG tablet TAKE 1 TABLET BY MOUTH ONCE A DAY (Patient taking differently: Take 10 mg by mouth daily.) 90 tablet 3   isosorbide mononitrate (IMDUR) 30 MG 24 hr tablet Take 30 mg by mouth daily.     LINZESS 72 MCG capsule TAKE 1 CAPSULE BY MOUTH ONCE DAILY BEFORE BREAKFAST (Patient taking differently: Take 72 mcg by mouth daily before breakfast.) 30 capsule 2   megestrol (MEGACE) 40 MG tablet Take 40 mg by mouth daily.     mometasone-formoterol (DULERA) 100-5 MCG/ACT AERO Take 2 puffs first thing in am and then another 2 puffs about 12 hours later. (Patient taking differently: Inhale 2 puffs into the lungs every 12 (twelve) hours.) 1 Inhaler 11   multivitamin (RENA-VIT) TABS tablet Take 1 tablet by mouth at bedtime.  0   nitroGLYCERIN (NITROSTAT) 0.4 MG SL tablet Place 1 tablet (0.4 mg total) under the tongue every 5 (five) minutes as needed for chest pain. 30 tablet 1   ondansetron (ZOFRAN-ODT) 4 MG disintegrating tablet Take 1 tablet (4 mg total) by mouth every 8 (eight) hours as needed for nausea or vomiting. (Patient taking differently: Take 4 mg by mouth daily. Alternate with Promethazine) 20 tablet 0   oxyCODONE-acetaminophen (PERCOCET/ROXICET) 5-325 MG tablet Take 1 tablet by mouth 2 (two) times daily as needed for pain.     pantoprazole (PROTONIX) 40 MG tablet Take 1 tablet (40 mg total) by mouth daily.     polyethylene glycol powder (MIRALAX) 17 GM/SCOOP powder Take 17 g by mouth 2 (two) times daily as needed for moderate constipation or mild constipation. 255 g 0   promethazine (PHENERGAN) 25 MG tablet Take 25 mg by mouth every 6 (six) hours as needed for nausea or vomiting. Alternate with Ondansetron     traZODone (DESYREL)  100 MG tablet Take 50-100 mg by mouth at bedtime as needed for sleep.     venlafaxine XR (EFFEXOR XR) 150 MG 24 hr capsule Take 2 capsules (300 mg total) by mouth daily with breakfast.     vitamin B-12 (CYANOCOBALAMIN) 500 MCG tablet Take 500 mcg by mouth daily.     albuterol (VENTOLIN HFA) 108 (90 Base) MCG/ACT inhaler Inhale 2 puffs into the lungs every 6 (six) hours as needed for wheezing or shortness of breath (asthma). 8 g 0   azelastine (ASTELIN) 0.1 % nasal spray Place 1 spray into both nostrils daily as needed for allergies (seasonal allergies).     predniSONE (DELTASONE) 20 MG tablet Take 2 tablets (40 mg total) by mouth daily with breakfast. 8 tablet 0     ALLERGIES Doxycycline, Hydralazine, Aspirin, Hydrocodone, Ibuprofen, Tylenol [acetaminophen], and Iohexol  MEDICAL HISTORY Past Medical History:  Diagnosis Date   Aortic stenosis    abominal aorta, s/p distal aortic stent 09/28/18, 12/01/20   Bipolar 1 disorder ([place])    Blood transfusion without reported diagnosis    Carpal tunnel syndrome    Cataract    forming   Cervical cancer ([place] [facility] [place]) 1985   CHF (congestive heart failure) ([place])    Chronic kidney disease (CKD), stage V ([place])    COPD (chronic obstructive pulmonary disease) ([place]) 2000   Coronary artery disease    Diverticulitis 2008   GERD (gastroesophageal reflux disease)    Glaucoma    HLD (hyperlipidemia) 2013   Hypertension 2013   Hypovitaminosis D    IBS (irritable bowel syndrome) 2008  PAD (peripheral artery disease) ([facility])    Pancreatitis 10/2011   RLS (restless legs syndrome)    Schizophrenia ([facility])    Skin cancer    Small bowel obstruction ([place]) 2008   Thyroid disease      SOCIAL HISTORY Social History   Socioeconomic History   Marital status: Divorced    Spouse name: Not on file   Number of children: 1   Years of education: Not on file   Highest education level: Not on file  Occupational History   Occupation: disabled  Tobacco Use   Smoking  status: Former    Packs/day: 1.50    Years: 40.00    Pack years: 60.00    Types: Cigarettes    Quit date: 08/2021    Years since quitting: 0.1   Smokeless tobacco: Never   Tobacco comments:    She has cheated  Vaping Use   Vaping Use: Former   Substances: THC  Substance and Sexual Activity   Alcohol use: No   Drug use: Yes    Frequency: 3.0 times per week    Types: Marijuana    Comment: not much lately   Sexual activity: Yes    Birth control/protection: Post-menopausal, Surgical    Comment: Hysterectomy  Other Topics Concern   Not on file  Social History Narrative   Not on file   Social Determinants of Health   Financial Resource Strain: Not on file  Food Insecurity: Not on file  Transportation Needs: Not on file  Physical Activity: Not on file  Stress: Not on file  Social Connections: Not on file  Intimate Partner Violence: Not on file     FAMILY HISTORY Family History  Problem Relation Age of Onset   Other Mother        many bowel obstructions   Heart disease Mother    Colon polyps Mother    Kidney cancer Father    Bone cancer Father    Diabetes Father    Heart disease Father    Heart disease Brother    Colon cancer Paternal Grandfather    Diabetes Daughter    Esophageal cancer Neg Hx    Rectal cancer Neg Hx    Stomach cancer Neg Hx      Review of Systems: 12 systems were reviewed and negative except per HPI  Physical Exam: Vitals:   10/03/21 2230 10/03/21 2330  BP: (!) 184/93 (!) 187/78  Pulse: 85 80  Resp: (!) 27 20  SpO2: 98% 100%   Total I/O In: 11.1 [I.V.:11.1] Out: -   Intake/Output Summary (Last 24 hours) at 10/03/2021 2334 Last data filed at 10/03/2021 2153 Gross per 24 hour  Intake 11.07 ml  Output --  Net 11.07 ml   General: chronically ill appearing, on bipap CV: s1s2, rrr Lungs: fine crackles bibasilar, on bipap, unlabored, bl chest expansion Abd: soft, non-tender, non-distended Skin: no visible lesions or rashes Neuro:  normal speech, no gross focal deficits  Dialysis access: RIJ TDC c/d/I, LUE AVG +b/t  Test Results Reviewed Lab Results  Component Value Date   NA 133 (L) 10/03/2021   K 4.4 10/03/2021   CL 94 (L) 10/03/2021   CO2 29 10/03/2021   BUN 42 (H) 10/03/2021   CREATININE 2.77 (H) 10/03/2021   GFR 25.15 (L) 10/24/2020   CALCIUM 9.6 10/03/2021   ALBUMIN 3.5 10/03/2021   PHOS 3.3 09/21/2021    I have reviewed relevant outside healthcare records

## 2021-10-03 NOTE — Progress Notes (Addendum)
Informed of patient in ER. Just discharged 2 days ago. Presents with resp distress/SOB. Pulm edema on CXR, requiring BIPAP. Will arrange for HD overnight. Discussed with HD staff. Full consult to follow in AM provided she is to be admitted.  Gean Quint, MD [facility]

## 2021-10-03 NOTE — H&P (Signed)
NAME:  [name], MRN:  [id number], DOB:  [birth year], LOS: 0 ADMISSION DATE:  10/03/2021, CONSULTATION DATE:  10/03/21 REFERRING MD:  [place] CHIEF COMPLAINT:  Dyspnea   History of Present Illness:  [name] is a 61 y.o. female who has a PMH as outlined below including but not limited to ESRD on HD MWF (last session reportedly Monday 10/02/2021), HTN, HLD, COPD, chronic hypoxic respiratory failure on 2 L nasal cannula, DNR status.  She was just admitted 09/29/2021 through 10/01/2021 for dyspnea felt to be due to volume overload and possible COPD exacerbation.  She had routine HD and was discharged home 1/1.  She then presented to [facility] ED 1/3 with dyspnea again.  Chest x-ray consistent with pulmonary edema.  Blood pressure elevated with systolics in the 627O.  She was started on nitroglycerin and placed on BiPAP.  Renal was consulted and planned for overnight HD session for volume removal.  Due to nitroglycerin and BiPAP, PCCM asked to evaluate for admission to ICU.  Pertinent  Medical History:  has Anxiety state; DEPRESSION; Essential hypertension; ASTHMA; ESOPHAGEAL STRICTURE; GERD; HIATAL HERNIA; ADHESIONS, INTESTINAL W/OBSTRUCTION; Diverticulosis of large intestine; Irritable bowel syndrome; ARTHRITIS; HEADACHE, CHRONIC; Abdominal pain, generalized; FECAL OCCULT BLOOD; SKIN CANCER, HX OF; Anemia, iron deficiency; COLONIC POLYPS, HYPERPLASTIC, HX OF; Community acquired pneumonia; Heart failure with preserved ejection fraction ([place]); COPD GOLD 0 / still smoking; CKD (chronic kidney disease) stage 4, GFR 15-29 ml/min ([place]); Affective bipolar disorder ([place]); Schizophrenia ([place]); Cigarette smoker; Protein-calorie malnutrition, severe ([place]); Renovascular hypertension, malignant; Hypertensive urgency; Chest pain; Benign neoplasm of transverse colon; Benign neoplasm of descending colon; Benign neoplasm of sigmoid colon; Renal artery stenosis, native, bilateral ([place]); Abnormal stress test; Abnormality on  screening test; Anemia, chronic disease; Screening for breast cancer; At high risk for injury related to fall; Diverticulitis of colon with perforation; History of partial colectomy; Hyperparathyroidism, secondary renal ([place]); Postsurgical menopause; Pure hypercholesterolemia; Vaginal atrophy; Abdominal aortic stenosis; Renal artery stenosis ([place]); Acute GI bleeding; Acute blood loss anemia; GIB (gastrointestinal bleeding); Hematochezia; Diverticulosis of colon without hemorrhage; Pain of upper abdomen; Polyp of ascending colon; AVM (arteriovenous malformation) of small bowel, acquired with hemorrhage; Angiodysplasia of colon; Chronic mesenteric ischemia ([place]); 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  Significant Hospital Events: Including procedures, antibiotic start and stop dates in addition to other pertinent events   1/3 admit.  Interim History / Subjective:  Comfortable on BiPAP.  Feels breathing is a bit easier.  Objective:  Blood pressure (!) 187/82, pulse 85, resp. rate 19, SpO2 98 %.    FiO2 (%):  [40 %] 40 %  No intake or output data in the 24 hours ending 10/03/21 2144 There were no vitals filed for this visit.  Examination: General: Adult female, chronically ill appearing, on BiPAP. Neuro: A&O x 3, no deficits. HEENT: [place]/AT. Sclerae anicteric. EOMI.  BiPAP in place. Cardiovascular: RRR, no M/R/G.  Lungs: Respirations even and unlabored.  Faint crackles. Abdomen:  BS x 4, soft, NT/ND.  Musculoskeletal: No gross deformities, no edema.  Skin: Intact, warm, no rashes.  Labs/imaging personally reviewed:  CXR 1/3 > pulmonary edema.  Assessment & Plan:   Hypertensive urgency - unclear etiology.  She reportedly completed a full dialysis session yesterday 1/2. Hx HTN, HLD, CAD, AS s/p distal aortic stent 2019. - Continue nitroglycerin for now. - Volume removal per HD. - Continue home Lopressor, Imdur, Norvasc, Lipitor, Zetia.  Acute pulmonary edema -secondary to above. - Continue nitroglycerin. - Continue BiPAP as needed. - Volume removal per HD.  ESRD on HD MWF (last session Mon 1/2).  Per chart review, she has considered stopping HD altogether. - Nephrology planning for extra HD tonight, new lower dry weight planned on recent admission. - Further / ongoing HD discussions per nephrology.  Acute on chronic hypoxic respiratory failure - 2/2 acute pulmonary edema + underlying hx COPD. Intermittent ongoing tobacco dependence - per chart review, she has "cheat" days with periodic smoking. - Continue supplemental O2 as needed to maintain SpO2 > 88%. - Continue home BD's. - Nicotine patch. - Tobacco cessation counseling.  Hx bipolar disorder, anxiety, chronic pain. - Continue home Effexor, Trazodone, Megace, Percocet.  DNR status -with patient today and noted on recent admission including meeting with palliative care. - Continue supportive care.   Will admit to ICU overnight but likely can transfer out in AM and have [place] assume care 1/4 with PCCM off.  Best practice (evaluated daily):  Diet/type: Regular consistency (see orders) DVT prophylaxis: prophylactic heparin  GI prophylaxis: PPI Lines: N/A Foley:  N/A Code Status:  DNR Last date of multidisciplinary goals of care discussion: None yet.  Labs   CBC: Recent Labs  Lab 09/29/21 0732 09/30/21 0402 10/01/21 0351 10/03/21 2023 10/03/21 2029  WBC 11.0* 6.1 9.2 14.1*  --   NEUTROABS  9.9*  --   --  13.5*  --   HGB 9.4* 8.7* 9.0* 8.4* 9.2*  HCT 30.2* 27.6* 28.5* 26.7* 27.0*  MCV 101.3* 97.9 99.0 97.8  --   PLT 295 225 260 291  --     Basic Metabolic Panel: Recent Labs  Lab 09/29/21 0732 09/30/21 0402 10/01/21 0351 10/03/21 2023 10/03/21 2029  NA 138 134* 132* 136 133*  K 4.0 3.6 4.1 4.5 4.4  CL 101 96* 94* 94*  --   CO2 23 25 27 29   --   GLUCOSE 118* 103* 129* 114*  --   BUN 31* 23* 52* 42*  --   CREATININE 3.05* 2.27* 3.46* 2.77*  --   CALCIUM 9.0 8.7* 8.8* 9.6  --    GFR: Estimated Creatinine Clearance: 13.8 mL/min (A) (by C-G formula based on SCr of 2.77 mg/dL (H)). Recent Labs  Lab 09/29/21 0732 09/30/21 0402 10/01/21 0351 10/03/21 2023  WBC 11.0* 6.1 9.2 14.1*    Liver Function Tests: Recent Labs  Lab 10/03/21 2023  AST 41  ALT 55*  ALKPHOS 158*  BILITOT 1.2  PROT 6.4*  ALBUMIN 3.5   No results for input(s): LIPASE, AMYLASE in the last 168 hours. No results for input(s): AMMONIA in the last 168 hours.  ABG    Component Value Date/Time   HCO3 30.7 (H) 10/03/2021 2029   TCO2 32 10/03/2021 2029   O2SAT 80.0 10/03/2021 2029     Coagulation Profile: No results for input(s): INR, PROTIME in the last 168 hours.  Cardiac Enzymes: No results for input(s): CKTOTAL, CKMB, CKMBINDEX, TROPONINI in the last 168 hours.  HbA1C: No results found for: HGBA1C  CBG: No results for input(s): GLUCAP in the last 168 hours.  Review of Systems:   All negative; except for those  that are bolded, which indicate positives.  Constitutional: weight loss, weight gain, night sweats, fevers, chills, fatigue, weakness.  HEENT: headaches, sore throat, sneezing, nasal congestion, post nasal drip, difficulty swallowing, tooth/dental problems, visual complaints, visual changes, ear aches. Neuro: difficulty with speech, weakness, numbness, ataxia. CV:  chest pain, orthopnea, PND, swelling in lower extremities, dizziness, palpitations, syncope.  Resp:  cough, hemoptysis, dyspnea, wheezing. GI: heartburn, indigestion, abdominal pain, nausea, vomiting, diarrhea, constipation, change in bowel habits, loss of appetite, hematemesis, melena, hematochezia.  GU: dysuria, change in color of urine, urgency or frequency, flank pain, hematuria. MSK: joint pain or swelling, decreased range of motion. Psych: change in mood or affect, depression, anxiety, suicidal ideations, homicidal ideations. Skin: rash, itching, bruising.   Past Medical History:  She,  has a past medical history of Aortic stenosis, Bipolar 1 disorder ([place]), Blood transfusion without reported diagnosis, Carpal tunnel syndrome, Cataract, Cervical cancer ([place]) (1985), CHF (congestive heart failure) ([place]), Chronic kidney disease (CKD), stage V ([place]), COPD (chronic obstructive pulmonary disease) ([place]) (2000), Coronary artery disease, Diverticulitis (2008), GERD (gastroesophageal reflux disease), Glaucoma, HLD (hyperlipidemia) (2013), Hypertension (2013), Hypovitaminosis D, IBS (irritable bowel syndrome) (2008), PAD (peripheral artery disease) ([place]), Pancreatitis (10/2011), RLS (restless legs syndrome), Schizophrenia ([place]), Skin cancer, Small bowel obstruction ([place]) (2008), and Thyroid disease.   Surgical History:   Past Surgical History:  Procedure Laterality Date   ABDOMINAL ANGIOGRAM N/A 09/07/2014   Procedure: ABDOMINAL ANGIOGRAM;  Surgeon: Laverda Page, MD;  Location: [facility] CATH LAB;  Service: Cardiovascular;  Laterality: N/A;   ABDOMINAL AORTOGRAM W/LOWER EXTREMITY N/A 12/28/2020   Procedure: ABDOMINAL AORTOGRAM W/LOWER EXTREMITY;  Surgeon: Cherre Robins, MD;  Location: [facility] CV LAB;  Service: Cardiovascular;  Laterality: N/A;   APPENDECTOMY  10/01/1994   AV FISTULA PLACEMENT Left 09/14/2021   Procedure: INSERTION OF ARTERIOVENOUS LEFT UPPER GORE-TEX GRAFT ARM;  Surgeon: Cherre Robins, MD;  Location: [facility] OR;  Service: Vascular;  Laterality: Left;   BIOPSY  08/06/2020    Procedure: BIOPSY;  Surgeon: Jackquline Denmark, MD;  Location: [facility] ENDOSCOPY;  Service: Endoscopy;;   COLON SURGERY  10/01/2006   6 inches of colon removed due to obstruction   COLONOSCOPY WITH PROPOFOL N/A 10/25/2016   Procedure: COLONOSCOPY WITH PROPOFOL;  Surgeon: Milus Banister, MD;  Location: [facility] ENDOSCOPY;  Service: Endoscopy;  Laterality: N/A;   COLONOSCOPY WITH PROPOFOL N/A 08/07/2020   Procedure: COLONOSCOPY WITH PROPOFOL;  Surgeon: Jackquline Denmark, MD;  Location: [facility] ENDOS[facility];  Service: Endoscopy;  Laterality: N/A;   COLONOSCOPY WITH PROPOFOL N/A 08/27/2020   Procedure: COLONOSCOPY WITH PROPOFOL;  Surgeon: Lavena Bullion, DO;  Location: [facility] ENDOSCOPY;  Service: Gastroenterology;  Laterality: N/A;   CORONARY ANGIOPLASTY WITH STENT PLACEMENT  09/07/2014   "2"   ENTEROSCOPY N/A 08/27/2020   Procedure: ENTEROSCOPY;  Surgeon: Lavena Bullion, DO;  Location: [facility] ENDOS[facility];  Service: Gastroenterology;  Laterality: N/A;  Push enteroscopy    ENTEROSCOPY N/A 06/12/2021   Procedure: ENTEROSCOPY;  Surgeon: Rush Landmark Telford Nab., MD;  Location: [facility] ENDOS[facility];  Service: Gastroenterology;  Laterality: N/A;   ENTEROSCOPY N/A 08/18/2021   Procedure: ENTEROSCOPY;  Surgeon: Jerene Bears, MD;  Location: [facility]OS[facility];  Service: Gastroenterology;  Laterality: N/A;   ESOPHAGOGASTRODUODENOSCOPY (EGD) WITH PROPOFOL N/A 08/06/2020   Procedure: ESOPHAGOGASTRODUODENOSCOPY (EGD) WITH PROPOFOL;  Surgeon: Jackquline Denmark, MD;  Location: [facility] END[facility];  Service: Endoscopy;  Laterality: N/A;   GIVENS CAPSULE STUDY N/A 08/24/2020   Procedure: GIVENS CAPSULE STUDY;  Surgeon: Lavena Bullion, DO;  Location:  [facility] ENDOSCOPY;  Service: Gastroenterology;  Laterality: N/A;   GIVENS CAPSULE STUDY N/A 08/18/2021   Procedure: GIVENS CAPSULE;  Surgeon: Jerene Bears, MD;  Location: [facility];  Service: Gastroenterology;  Laterality: N/A;   HOT HEMOSTASIS N/A 08/07/2020   Procedure: HOT HEMOSTASIS (ARGON PLASMA  COAGULATION/BICAP);  Surgeon: Jackquline Denmark, MD;  Location: [facility]OS[facility];  Service: Endoscopy;  Laterality: N/A;   HOT HEMOSTASIS N/A 08/27/2020   Procedure: HOT HEMOSTASIS (ARGON PLASMA COAGULATION/BICAP);  Surgeon: Lavena Bullion, DO;  Location: [facility]OS[facility];  Service: Gastroenterology;  Laterality: N/A;   IR FLUORO GUIDE CV LINE RIGHT  08/21/2021   IR US GUIDE VASC ACCESS RIGHT  08/21/2021   LEFT HEART CATH AND CORONARY ANGIOGRAPHY N/A 03/25/2018   Procedure: LEFT HEART CATH AND CORONARY ANGIOGRAPHY;  Surgeon: Nigel Mormon, MD;  Location: [facility] CV LAB;  Service: Cardiovascular;  Laterality: N/A;   LEFT HEART CATH AND CORONARY ANGIOGRAPHY N/A 05/09/2021   Procedure: LEFT HEART CATH AND CORONARY ANGIOGRAPHY;  Surgeon: Adrian Prows, MD;  Location: [place] CV LAB;  Service: Cardiovascular;  Laterality: N/A;   LEFT HEART CATHETERIZATION WITH CORONARY ANGIOGRAM N/A 09/07/2014   Procedure: LEFT HEART CATHETERIZATION WITH CORONARY ANGIOGRAM;  Surgeon: Laverda Page, MD;  Location: [facility] CATH LAB;  Service: Cardiovascular;  Laterality: N/A;   LOWER EXTREMITY ANGIOGRAPHY  10/29/2017   Procedure: Lower Extremity Angiography;  Surgeon: Adrian Prows, MD;  Location: [place] CV LAB;  Service: Cardiovascular;;   PERIPHERAL VASCULAR CATHETERIZATION N/A 11/01/2015   Procedure: Renal Angiography;  Surgeon: Adrian Prows, MD;  Location: [facility] CV LAB;  Service: Cardiovascular;  Laterality: N/A;   PERIPHERAL VASCULAR CATHETERIZATION N/A 04/10/2016   Procedure: Renal Angiography;  Surgeon: Adrian Prows, MD;  Location: [facility] CV LAB;  Service: Cardiovascular;  Laterality: N/A;   PERIPHERAL VASCULAR CATHETERIZATION  04/10/2016   Procedure: Peripheral Vascular Intervention;  Surgeon: Adrian Prows, MD;  Location: [place] CV LAB;  Service: Cardiovascular;;   PERIPHERAL VASCULAR INTERVENTION  10/29/2017   Procedure: PERIPHERAL VASCULAR INTERVENTION;  Surgeon: Adrian Prows, MD;  Location: [place] CV LAB;  Service: Cardiovascular;;   PERIPHERAL VASCULAR INTERVENTION Bilateral 12/28/2020   Procedure: PERIPHERAL VASCULAR INTERVENTION;  Surgeon: Cherre Robins, MD;  Location: [facility] CV LAB;  Service: Cardiovascular;  Laterality: Bilateral;   POLYPECTOMY  08/07/2020   Procedure: POLYPECTOMY;  Surgeon: Jackquline Denmark, MD;  Location: [facility];  Service: Endoscopy;;   POLYPECTOMY  08/27/2020   Procedure: POLYPECTOMY;  Surgeon: Lavena Bullion, DO;  Location: [facility]OS[facility];  Service: Gastroenterology;;   POLYPECTOMY     RENAL ANGIOGRAPHY N/A 10/29/2017   Procedure: RENAL ANGIOGRAPHY;  Surgeon: Adrian Prows, MD;  Location: [place] CV LAB;  Service: Cardiovascular;  Laterality: N/A;   RENAL ANGIOGRAPHY N/A 05/10/2020   Procedure: RENAL ANGIOGRAPHY;  Surgeon: Nigel Mormon, MD;  Location: [place] CV LAB;  Service: Cardiovascular;  Laterality: N/A;   RIGHT OOPHORECTOMY Right 10/01/1994   SUBMUCOSAL TATTOO INJECTION  06/12/2021   Procedure: SUBMUCOSAL TATTOO INJECTION;  Surgeon: Irving Copas., MD;  Location: [facility];  Service: Gastroenterology;;   TOTAL ABDOMINAL HYSTERECTOMY  10/02/1995   UPPER GASTROINTESTINAL ENDOSCOPY       Social History:   reports that she quit smoking about 2 months ago. Her smoking use included cigarettes. She has a 60.00 pack-year smoking history. She has never used smokeless tobacco. She reports current drug use. Frequency: 3.00 times per week. Drug: Marijuana. She reports that she does not  drink alcohol.   Family History:  Her family history includes Bone cancer in her father; Colon cancer in her paternal grandfather; Colon polyps in her mother; Diabetes in her daughter and father; Heart disease in her brother, father, and mother; Kidney cancer in her father; Other in her mother. There is no history of Esophageal cancer, Rectal cancer, or Stomach cancer.   Allergies Allergies  Allergen Reactions   Doxycycline  Anaphylaxis and Hives   Hydralazine Shortness Of Breath and Rash    Patient reports "couldn't breath"   Aspirin Other (See Comments)    Internal bleeding- MD SAID to not take this   Hydrocodone Nausea And Vomiting   Ibuprofen Other (See Comments)    Caused internal bleeding   Tylenol [Acetaminophen] Other (See Comments)    MD told the patient to not take this   Iohexol Itching and Other (See Comments)    Pt has itching nose after iv contrast injection     Home Medications  Prior to Admission medications   Medication Sig Start Date End Date Taking? Authorizing Provider  albuterol (PROVENTIL) (2.5 MG/3ML) 0.083% nebulizer solution Take 3 mLs (2.5 mg total) by nebulization every 2 (two) hours as needed for shortness of breath. 10/01/21  Yes Vann, Tomi Bamberger, DO  ALPRAZolam (XANAX) 1 MG tablet Take 1 mg by mouth 2 (two) times daily as needed for anxiety. 04/15/17  Yes [provider]  amLODipine (NORVASC) 10 MG tablet TAKE 1 TABLET BY MOUTH ONCE DAILY Patient taking differently: Take 10 mg by mouth daily. 04/13/21  Yes Cantwell, Celeste C, PA-C  atorvastatin (LIPITOR) 80 MG tablet Take 80 mg by mouth at bedtime.   Yes [provider]  calcium acetate (PHOSLO) 667 MG capsule Take 2,001 mg by mouth See admin instructions. Take 2001mg  (3 capsules) by mouth three times a day with meals and 667mg  (1 capsule) with a snack   Yes [provider]  Calcium Carb-Cholecalciferol (CALCIUM 600 + D PO) Take 1 tablet by mouth daily.   Yes [provider]  cetirizine (ZYRTEC) 10 MG tablet Take 10 mg by mouth daily as needed for allergies.   Yes [provider]  Ensure (ENSURE) Take 237 mLs by mouth 2 (two) times daily between meals.   Yes [provider]  ezetimibe (ZETIA) 10 MG tablet TAKE 1 TABLET BY MOUTH ONCE A DAY Patient taking differently: Take 10 mg by mouth daily. 05/03/21  Yes Cantwell, Celeste C, PA-C  isosorbide mononitrate (IMDUR) 30 MG 24 hr tablet  Take 30 mg by mouth daily.   Yes [provider]  LINZESS 72 MCG capsule TAKE 1 CAPSULE BY MOUTH ONCE DAILY BEFORE BREAKFAST Patient taking differently: Take 72 mcg by mouth daily before breakfast. 08/03/21  Yes Levin Erp, PA  megestrol (MEGACE) 40 MG tablet Take 40 mg by mouth daily.   Yes [provider]  mometasone-formoterol (DULERA) 100-5 MCG/ACT AERO Take 2 puffs first thing in am and then another 2 puffs about 12 hours later. Patient taking differently: Inhale 2 puffs into the lungs every 12 (twelve) hours. 04/26/17  Yes Tanda Rockers, MD  multivitamin (RENA-VIT) TABS tablet Take 1 tablet by mouth at bedtime. 09/21/21  Yes Mercy Riding, MD  nitroGLYCERIN (NITROSTAT) 0.4 MG SL tablet Place 1 tablet (0.4 mg total) under the tongue every 5 (five) minutes as needed for chest pain. 10/15/19  Yes Miquel Dunn, NP  ondansetron (ZOFRAN-ODT) 4 MG disintegrating tablet Take 1 tablet (4 mg  total) by mouth every 8 (eight) hours as needed for nausea or vomiting. Patient taking differently: Take 4 mg by mouth daily. Alternate with Promethazine 07/25/21  Yes Hayden Rasmussen, MD  oxyCODONE-acetaminophen (PERCOCET/ROXICET) 5-325 MG tablet Take 1 tablet by mouth 2 (two) times daily as needed for pain. 09/13/21 09/13/22 Yes [provider]  pantoprazole (PROTONIX) 40 MG tablet Take 1 tablet (40 mg total) by mouth daily. 08/22/21  Yes Aline August, MD  polyethylene glycol powder (MIRALAX) 17 GM/SCOOP powder Take 17 g by mouth 2 (two) times daily as needed for moderate constipation or mild constipation. 09/21/21  Yes Mercy Riding, MD  promethazine (PHENERGAN) 25 MG tablet Take 25 mg by mouth every 6 (six) hours as needed for nausea or vomiting. Alternate with Ondansetron 08/16/20  Yes [provider]  traZODone (DESYREL) 100 MG tablet Take 50-100 mg by mouth at bedtime as needed for sleep.   Yes [provider]  venlafaxine XR (EFFEXOR XR) 150  MG 24 hr capsule Take 2 capsules (300 mg total) by mouth daily with breakfast. 05/12/21  Yes Arrien, Jimmy Picket, MD  vitamin B-12 (CYANOCOBALAMIN) 500 MCG tablet Take 500 mcg by mouth daily.   Yes [provider]  albuterol (VENTOLIN HFA) 108 (90 Base) MCG/ACT inhaler Inhale 2 puffs into the lungs every 6 (six) hours as needed for wheezing or shortness of breath (asthma). 08/22/21   Aline August, MD  azelastine (ASTELIN) 0.1 % nasal spray Place 1 spray into both nostrils daily as needed for allergies (seasonal allergies). 06/24/19   [provider]  labetalol (NORMODYNE) 100 MG tablet Take 100 mg by mouth 2 (two) times daily.    [provider]  predniSONE (DELTASONE) 20 MG tablet Take 2 tablets (40 mg total) by mouth daily with breakfast. 10/02/21   Geradine Girt, DO     Critical care time: 35 min.   Montey Hora, [facility] For pager details, please see AMION or use Epic chat  After 1900, please call [facility] for cross coverage needs 10/03/2021, 9:44 PM

## 2021-10-04 ENCOUNTER — Inpatient Hospital Stay (HOSPITAL_COMMUNITY): Payer: Medicare Other

## 2021-10-04 ENCOUNTER — Encounter (HOSPITAL_COMMUNITY): Payer: Self-pay | Admitting: Pulmonary Disease

## 2021-10-04 DIAGNOSIS — I160 Hypertensive urgency: Secondary | ICD-10-CM | POA: Diagnosis not present

## 2021-10-04 LAB — RENAL FUNCTION PANEL
Albumin: 3.1 g/dL — ABNORMAL LOW (ref 3.5–5.0)
Anion gap: 10 (ref 5–15)
BUN: 35 mg/dL — ABNORMAL HIGH (ref 6–20)
CO2: 25 mmol/L (ref 22–32)
Calcium: 8.7 mg/dL — ABNORMAL LOW (ref 8.9–10.3)
Chloride: 99 mmol/L (ref 98–111)
Creatinine, Ser: 1.92 mg/dL — ABNORMAL HIGH (ref 0.44–1.00)
GFR, Estimated: 30 mL/min — ABNORMAL LOW (ref >60)
Glucose, Bld: 110 mg/dL — ABNORMAL HIGH (ref 70–99)
Phosphorus: 3.4 mg/dL (ref 2.5–4.6)
Potassium: 4.7 mmol/L (ref 3.5–5.1)
Sodium: 134 mmol/L — ABNORMAL LOW (ref 135–145)

## 2021-10-04 LAB — BASIC METABOLIC PANEL
Anion gap: 12 (ref 5–15)
BUN: 46 mg/dL — ABNORMAL HIGH (ref 6–20)
CO2: 25 mmol/L (ref 22–32)
Calcium: 8.6 mg/dL — ABNORMAL LOW (ref 8.9–10.3)
Chloride: 95 mmol/L — ABNORMAL LOW (ref 98–111)
Creatinine, Ser: 2.74 mg/dL — ABNORMAL HIGH (ref 0.44–1.00)
GFR, Estimated: 19 mL/min — ABNORMAL LOW (ref >60)
Glucose, Bld: 208 mg/dL — ABNORMAL HIGH (ref 70–99)
Potassium: 4.7 mmol/L (ref 3.5–5.1)
Sodium: 132 mmol/L — ABNORMAL LOW (ref 135–145)

## 2021-10-04 LAB — CBC
HCT: 22.3 % — ABNORMAL LOW (ref 36.0–46.0)
HCT: 29.2 % — ABNORMAL LOW (ref 36.0–46.0)
Hemoglobin: 7 g/dL — ABNORMAL LOW (ref 12.0–15.0)
Hemoglobin: 9.6 g/dL — ABNORMAL LOW (ref 12.0–15.0)
MCH: 30.6 pg (ref 26.0–34.0)
MCH: 31.3 pg (ref 26.0–34.0)
MCHC: 31.4 g/dL (ref 30.0–36.0)
MCHC: 32.9 g/dL (ref 30.0–36.0)
MCV: 97.4 fL (ref 80.0–100.0)
MCV: 95.1 fL (ref 80.0–100.0)
Platelets: 229 10*3/uL (ref 150–400)
Platelets: 246 10*3/uL (ref 150–400)
RBC: 2.29 MIL/uL — ABNORMAL LOW (ref 3.87–5.11)
RBC: 3.07 MIL/uL — ABNORMAL LOW (ref 3.87–5.11)
RDW: 18.5 % — ABNORMAL HIGH (ref 11.5–15.5)
RDW: 17.3 % — ABNORMAL HIGH (ref 11.5–15.5)
WBC: 7.6 10*3/uL (ref 4.0–10.5)
WBC: 11.2 10*3/uL — ABNORMAL HIGH (ref 4.0–10.5)
nRBC: 0 % (ref 0.0–0.2)
nRBC: 0.2 % (ref 0.0–0.2)

## 2021-10-04 LAB — PHOSPHORUS: Phosphorus: 3.9 mg/dL (ref 2.5–4.6)

## 2021-10-04 LAB — PREPARE RBC (CROSSMATCH)

## 2021-10-04 LAB — GLUCOSE, CAPILLARY
Glucose-Capillary: 114 mg/dL — ABNORMAL HIGH (ref 70–99)
Glucose-Capillary: 99 mg/dL (ref 70–99)

## 2021-10-04 LAB — MRSA NEXT GEN BY PCR, NASAL: MRSA by PCR Next Gen: NOT DETECTED

## 2021-10-04 LAB — MAGNESIUM: Magnesium: 1.8 mg/dL (ref 1.7–2.4)

## 2021-10-04 LAB — TROPONIN I (HIGH SENSITIVITY): Troponin I (High Sensitivity): 56 ng/L — ABNORMAL HIGH (ref <18)

## 2021-10-04 MED ORDER — CHLORHEXIDINE GLUCONATE CLOTH 2 % EX PADS
6.0000 | MEDICATED_PAD | Freq: Every day | CUTANEOUS | Status: DC
  Administered 2021-10-04 – 2021-10-06 (×4): 6 via TOPICAL

## 2021-10-04 MED ORDER — CHLORHEXIDINE GLUCONATE 0.12 % MT SOLN
15.0000 mL | Freq: Two times a day (BID) | OROMUCOSAL | Status: DC
  Administered 2021-10-04 – 2021-10-06 (×5): 15 mL via OROMUCOSAL
  Filled 2021-10-04 (×3): qty 15

## 2021-10-04 MED ORDER — CLEVIDIPINE BUTYRATE 0.5 MG/ML IV EMUL
0.0000 mg/h | INTRAVENOUS | Status: AC
  Administered 2021-10-04: 1 mg/h via INTRAVENOUS
  Filled 2021-10-04 (×2): qty 100

## 2021-10-04 MED ORDER — PRISMASOL BGK 4/2.5 32-4-2.5 MEQ/L EC SOLN: Status: DC

## 2021-10-04 MED ORDER — ARFORMOTEROL TARTRATE 15 MCG/2ML IN NEBU
15.0000 ug | INHALATION_SOLUTION | Freq: Two times a day (BID) | RESPIRATORY_TRACT | Status: DC
  Administered 2021-10-04 – 2021-10-12 (×16): 15 ug via RESPIRATORY_TRACT
  Filled 2021-10-04 (×16): qty 2

## 2021-10-04 MED ORDER — PRISMASOL BGK 4/2.5 32-4-2.5 MEQ/L REPLACEMENT SOLN: Status: DC

## 2021-10-04 MED ORDER — SODIUM CHLORIDE 0.9% IV SOLUTION: Freq: Once | INTRAVENOUS | Status: AC

## 2021-10-04 MED ORDER — BUDESONIDE 0.25 MG/2ML IN SUSP
0.2500 mg | Freq: Two times a day (BID) | RESPIRATORY_TRACT | Status: DC
  Administered 2021-10-04 – 2021-10-12 (×16): 0.25 mg via RESPIRATORY_TRACT
  Filled 2021-10-04 (×16): qty 2

## 2021-10-04 MED ORDER — ORAL CARE MOUTH RINSE
15.0000 mL | Freq: Two times a day (BID) | OROMUCOSAL | Status: DC
  Administered 2021-10-04 – 2021-10-06 (×6): 15 mL via OROMUCOSAL

## 2021-10-04 MED ORDER — BOOST / RESOURCE BREEZE PO LIQD CUSTOM
237.0000 mL | Freq: Three times a day (TID) | ORAL | Status: DC
  Administered 2021-10-04 – 2021-10-09 (×13): 1 via ORAL
  Administered 2021-10-10: 237 mL via ORAL
  Administered 2021-10-10 – 2021-10-12 (×6): 1 via ORAL

## 2021-10-04 MED ORDER — SODIUM CHLORIDE 0.9 % IV SOLN: INTRAVENOUS | Status: DC | PRN

## 2021-10-04 MED ORDER — HEPARIN SODIUM (PORCINE) 1000 UNIT/ML DIALYSIS
1000.0000 [IU] | INTRAMUSCULAR | Status: DC | PRN
  Administered 2021-10-04: 3200 [IU] via INTRAVENOUS_CENTRAL
  Filled 2021-10-04 (×2): qty 4

## 2021-10-04 MED ORDER — AMLODIPINE BESYLATE 5 MG PO TABS
5.0000 mg | ORAL_TABLET | Freq: Every day | ORAL | Status: DC
  Administered 2021-10-04 – 2021-10-05 (×2): 5 mg via ORAL
  Filled 2021-10-04 (×2): qty 1

## 2021-10-04 NOTE — Progress Notes (Signed)
[facility] Progress Note  Name: [name] MRN: [id number] DOB: 09-Jun-1961  Chief Complaint:  Shortness of breath   Subjective:  admitted to ICU and wasn't able to get HD overnight in the interim.  On clevidipine gtt.  Working harder to breathe.  She may go back on bipap.  Discussed risks/benefits/indications for PRBC's and she does also consent for blood. She states she's had 5 units over past year  Review of systems:  Shortness of breath limits interview   -------------- Background  [name] is a/an 61 y.o. female with a past medical history of ESRD on HD, hypertension, hyperlipidemia, COPD, h/o AS, chronic hypoxic respiratory failure on chronic 2 L nasal cannula who presents with shortness of breath.  She was recently admitted here from 12/30 to 10/01/2021 for dyspnea likely related to volume overload and possible COPD exacerbation.  She was discharged on 1/1, she did go to her HD unit yesterday on 1/2.  The plan previously was to adjust her dry weight further, however it seems that her dry weight was the same as it was before at yesterday's HD session. Chest x-ray here was consistent with pulmonary edema.  She was also found to be in hypertensive urgency with systolic blood pressures up in the 180s.  She was placed on BiPAP and nitroglycerin drip.  She currently feels slightly better in regards to her breathing. Plan is to do HD tonight. Denies any fevers, chills, chest pain, swelling.  Intake/Output Summary (Last 24 hours) at 10/04/2021 0614 Last data filed at 10/04/2021 0500 Gross per 24 hour  Intake 156.81 ml  Output --  Net 156.81 ml    Vitals:  Vitals:   10/04/21 0500 10/04/21 0515 10/04/21 0530 10/04/21 0545  BP: (!) 149/72 (!) 151/75 (!) 150/81 (!) 146/77  Pulse: 77 77 81 77  Resp: (!) 23 (!) 24 (!) 30 (!) 25  Temp:      TempSrc:      SpO2: 96% 95% 94% 94%  Weight:         Physical Exam:  General adult female in bed short of breath  HEENT  normocephalic atraumatic extraocular movements intact sclera anicteric Neck supple trachea midline Lungs crackles and increased work of breathing; on 4 liters oxygen  Heart S1S2 no rub Abdomen soft nontender nondistended Extremities trace to 1+ edema  Psych normal mood and affect   Medications reviewed   Labs:  BMP Latest Ref Rng & Units 10/04/2021 10/03/2021 10/03/2021  Glucose 70 - 99 mg/dL 208(H) - 114(H)  BUN 6 - 20 mg/dL 46(H) - 42(H)  Creatinine 0.44 - 1.00 mg/dL 2.74(H) - 2.77(H)  BUN/Creat Ratio 9 - 23 - - -  Sodium 135 - 145 mmol/L 132(L) 133(L) 136  Potassium 3.5 - 5.1 mmol/L 4.7 4.4 4.5  Chloride 98 - 111 mmol/L 95(L) - 94(L)  CO2 22 - 32 mmol/L 25 - 29  Calcium 8.9 - 10.3 mg/dL 8.6(L) - 9.6   -outpatient orders: MWF GO  3h 28min  40.8kg  3K/2.5 bath  Hep none  TDC/ LUA AVG done 09/14/21  - venofer 100 q HD thru 1/20  Assessment/Plan:   # ESRD:  - Start CRRT with net negative 100 ml/hr to start - please page nephrology for UF goal changes.  Unable to get HD overnight per staffing and working hard to breathe and on gtt.  Usually is MWF schedule    # AHRF, Pulmonary edema, underlying COPD -UF as tolerated, on nitro gtt, will  need new EDW   # Volume/ hypertensive urgency: EDW 40.8kg originally, post tx wt on 12/30 was 37.8kg, can challenge further -resume home anti-HTNs   # Anemia of Chronic Kidney Disease: note Hb drop.  PRBC's today.  Will review records for ESA dosing.   # Secondary Hyperparathyroidism/Hyperphosphatemia: stop home binders as starting CRRT. Phos ok.    Disposition - in ICU - cannot come to the HD unit  Claudia Desanctis, MD 10/04/2021 6:14 AM

## 2021-10-04 NOTE — Progress Notes (Signed)
Initial Nutrition Assessment  DOCUMENTATION CODES:   Severe malnutrition in context of chronic illness  INTERVENTION:   Boost Breeze po TID, each supplement provides 250 kcal and 9 grams of protein  Continue Renal MVI daily  Pt will need Dysphagia 3 (easy to chew) diet once advanced  Recommend checking Vitamin C  Pt would benefit from smaller, more frequent meals  NUTRITION DIAGNOSIS:   Severe Malnutrition related to chronic illness (ESRD/HD, COPD on  home oxygen, CHF) as evidenced by severe fat depletion, severe muscle depletion.  GOAL:   Patient will meet greater than or equal to 90% of their needs  MONITOR:   PO intake, Supplement acceptance, Weight trends, Labs  REASON FOR ASSESSMENT:   Rounds (CRRT)    ASSESSMENT:   61 yo female admitted with acute respiratory failure secondary to acute pulmonary edema with underlying COPD. hypertensive urgency requiring Cleviprex. PMH includes ESRD/HD, HTN, HLD, HLD, COPD, chronic respiratory failure on 2L [place], anxiety, bipolar disorder chronic pain, tobacco abuse, IBS, esophageal stricture, hiatal hernia  Currently on Bipap, trial of HFNC soon with Bipap prn Currently on CRRT, plan for iHD as able  Diet downgraded to CL due to respiratory status.  Pt with difficulty chewing/eating related to dentures that fit poorly. Pt reports she can eat well cooked or canned veggies and fruit as well as tender meat cut in small bites. Recommend Dysphagia 3-easy to chew diet once diet advanced. Discussed with pt  Pt likes Boost Breeze-Orange only; plan to order TID  Pt reports po intake has not been great PTA but unable to obtain detailed hx at this time given BiPap/respiratory status Pt reports weight loss which she indicates began with initiation of iHD a few months ago  +smoker (reports she quit in 08/2021 but also has cheated), on iHD with malnutrition. Pt is at high risk for several micronutrient deficiencies  EDW 42 kg in December  2022; current Outpatient EDW 40.8 kg but post iHD treatment on 12/30 pt weighed 37.8 kg. Based on this, pt with 10% wt loss in </= 1 month. Current wt 51.7 kg with mild/moderate edema on exam  Home binders currently on hold, on CRRT, phosphorus wdl  Labs: sodium 132 (L), phosphorus 3.9 (wdl) Meds: Megace, Rena-Vite, B-12   NUTRITION - FOCUSED PHYSICAL EXAM:  Flowsheet Row Most Recent Value  Orbital Region Severe depletion  Upper Arm Region Mild depletion  [Pt with sig upper extremity edema, may be masking some fat loss]  Thoracic and Lumbar Region Severe depletion  Buccal Region Unable to assess  [Pt on BiPap]  Temple Region Severe depletion  Clavicle Bone Region Severe depletion  Clavicle and Acromion Bone Region Severe depletion  Scapular Bone Region Severe depletion  Dorsal Hand Moderate depletion  Patellar Region Severe depletion  Anterior Thigh Region Severe depletion  Posterior Calf Region Severe depletion  Edema (RD Assessment) Moderate       Diet Order:   Diet Order             Diet clear liquid Room service appropriate? Yes; Fluid consistency: Thin  Diet effective now                   EDUCATION NEEDS:   Not appropriate for education at this time  Skin:  Skin Assessment: Reviewed RN Assessment  Last BM:  PTA  Height:   Ht Readings from Last 1 Encounters:  10/04/21 5\' 1"  (1.549 m)    Weight:   Wt Readings from Last 1  Encounters:  10/04/21 51.7 kg     BMI:  Body mass index is 21.54 kg/m.  Estimated Nutritional Needs:   Kcal:  1500-1700 kcals  Protein:  75-95 g  Fluid:  1000 mL plus UOP   Kerman Passey MS, RDN, LDN, CNSC Registered Dietitian III Clinical Nutrition RD Pager and On-Call Pager Number Located in [place]

## 2021-10-04 NOTE — Progress Notes (Addendum)
eLink Physician-Brief Progress Note Patient Name: [name] DOB: 1961/09/11 MRN: [id number]   Date of Service  10/04/2021  HPI/Events of Note  Patient admitted with acute respiratory failure secondary to volume overload / pulmonary edema, against a background of COPD, and ERSD on chronic 3X weekly dialysis, patient was also hypotensive, requiring Nitroglycerine gtt in the ED.  eICU Interventions  New Patient Evaluation. Cleviprex gtt ordered for uncontrolled hypertension.        [name] 10/04/2021, 1:30 AM

## 2021-10-04 NOTE — Progress Notes (Signed)
NAME:  [name], MRN:  [id number], DOB:  Aug 31, 1961, LOS: 1 ADMISSION DATE:  10/03/2021, CONSULTATION DATE:  10/03/21 REFERRING MD:  [place] CHIEF COMPLAINT:  Dyspnea   History of Present Illness:  [name] is a 61 y.o. female who has a PMH as outlined below including but not limited to ESRD on HD MWF (last session reportedly Monday 10/02/2021), HTN, HLD, COPD, chronic hypoxic respiratory failure on 2 L nasal cannula, DNR status.  She was just admitted 09/29/2021 through 10/01/2021 for dyspnea felt to be due to volume overload and possible COPD exacerbation.  She had routine HD and was discharged home 1/1.  She then presented to [facility] ED 1/3 with dyspnea again.  Chest x-ray consistent with pulmonary edema.  Blood pressure elevated with systolics in the 809X.  She was started on nitroglycerin and placed on BiPAP.  Renal was consulted and planned for overnight HD session for volume removal.  Due to nitroglycerin and BiPAP, PCCM asked to evaluate for admission to ICU.  Pertinent  Medical History:  has Anxiety state; DEPRESSION; Essential hypertension; ASTHMA; ESOPHAGEAL STRICTURE; GERD; HIATAL HERNIA; ADHESIONS, INTESTINAL W/OBSTRUCTION; Diverticulosis of large intestine; Irritable bowel syndrome; ARTHRITIS; HEADACHE, CHRONIC; Abdominal pain, generalized; FECAL OCCULT BLOOD; SKIN CANCER, HX OF; Anemia, iron deficiency; COLONIC POLYPS, HYPERPLASTIC, HX OF; Community acquired pneumonia; Heart failure with preserved ejection fraction ([place]); COPD GOLD 0 / still smoking; CKD (chronic kidney disease) stage 4, GFR 15-29 ml/min ([place]); Affective bipolar disorder ([place]); Schizophrenia ([place]); Cigarette smoker; Protein-calorie malnutrition, severe ([place]); Renovascular hypertension, malignant; Hypertensive urgency; Chest pain; Benign neoplasm of transverse colon; Benign neoplasm of descending colon; Benign neoplasm of sigmoid colon; Renal artery stenosis, native, bilateral ([place]); Abnormal stress test; Abnormality on  screening test; Anemia, chronic disease; Screening for breast cancer; At high risk for injury related to fall; Diverticulitis of colon with perforation; History of partial colectomy; Hyperparathyroidism, secondary renal ([place]); Postsurgical menopause; Pure hypercholesterolemia; Vaginal atrophy; Abdominal aortic stenosis; Renal artery stenosis ([place]); Acute GI bleeding; Acute blood loss anemia; GIB (gastrointestinal bleeding); Hematochezia; Diverticulosis of colon without hemorrhage; Pain of upper abdomen; Polyp of ascending colon; AVM (arteriovenous malformation) of small bowel, acquired with hemorrhage; Angiodysplasia of colon; Chronic mesenteric ischemia ([place]); 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[place]); Chest pain, rule out acute myocardial infarction; Abnormal EKG; AKI (acute kidney injury) ([place]); Dark stools; Diverticulosis; Adverse reaction to antiplatelet agent, sequela; Acute hypoxemic respiratory failure ([place]); Acute on chronic anemia; Nausea without vomiting; COPD (chronic obstructive pulmonary disease) ([place]); COPD with acute exacerbation ([place]); Hypervolemia associated with renal insufficiency; Dyslipidemia; Hypothyroidism (acquired); Goals of care, counseling/discussion; Chronic pain disorder; Acute pulmonary edema ([place]); and DNR (do not resuscitate) on their problem list.  Significant Hospital Events: Including procedures, antibiotic start and stop dates in addition to other pertinent events   1/3 admit to ICU for AHRF on BIPAP and urgent dialysis for HTN emergency  Interim History / Subjective:  Anxious appearing on BiPAP. Does not desat when taking mask off but is working hard to breath  Objective:  Blood pressure (!) 164/90, pulse 81, temperature 98.7 F (37.1 C), temperature source Axillary, resp. rate 20, height 5\' 1"  (1.549 m), weight 51.7 kg, SpO2 100 %.    FiO2 (%):  [40 %] 40 %   Intake/Output Summary (Last 24 hours) at 10/04/2021 1041 Last data filed at 10/04/2021 1000 Gross per 24 hour   Intake 306.25 ml  Output 70 ml  Net 236.25 ml   Filed Weights   10/04/21 0333  Weight: 51.7 kg  Physical Exam: General: Chronically ill-appearing, appears older than stated age, anxious HENT: [place], AT, BiPAP in place Eyes: EOMI, no scleral icterus Respiratory: Diminished breath sounds bilaterally Cardiovascular: RRR, -M/R/G, no JVD GI: BS+, soft, nontender Extremities:-Edema,-tenderness Neuro: AAO x4, CNII-XII grossly intact Lines: Right perm cath, left upper extremity immature AVF, +thrill and bruit  CXR 1/4 Right dialysis cath in place. Diffuse bilateral edema and pleural effusions   Labs/imaging personally reviewed:  CXR 1/3 > pulmonary edema.  Assessment & Plan:   Acute pulmonary edema secondary to hypertensive urgency Acute on chronic hypoxemic respiratory; 3L home O2 for COPD Active smoker - BiPAP PRN and transition to heated high flow  - CRRT for volume removal - Change home BD to nebulizers - Nicotine patch  Hypertensive urgency - unclear etiology.  She reportedly completed a full dialysis session yesterday 1/2. Hx HTN, HLD, CAD, AS s/p distal aortic stent 2019. - Continue cleviprex gtt for SBP <160 - Dialysis for volume removal - Resume home anti-hypertensive agents: Imdur, Norvasc  Acute blood loss anemia - no active bleeding - Transfuse for goal >7 - F/u post-transfusion CBC  ESRD on HD MWF (last session Mon 1/2).  Per chart review, she has considered stopping HD altogether. - Nephrology following - Further / ongoing HD discussions per nephrology.  Hx bipolar disorder, anxiety, chronic pain. - Continue home Effexor, Trazodone, Megace, Percocet, xanax  DNR status -with patient today and noted on recent admission including meeting with palliative care. - Continue supportive care.   Best practice (evaluated daily):  Diet/type: clear liquids DVT prophylaxis: prophylactic heparin  GI prophylaxis: PPI Lines: N/A Foley:  N/A Code Status:  DNR Last  date of multidisciplinary goals of care discussion: None yet.  Critical care time: 36 min.   The patient is critically ill with multiple organ systems failure and requires high complexity decision making for assessment and support, frequent evaluation and titration of therapies, application of advanced monitoring technologies and extensive interpretation of multiple databases.    Rodman Pickle, M.D. [facility] Pulmonary/Critical Care Medicine 10/04/2021 10:42 AM   Please see Amion for pager number to reach on-call Pulmonary and Critical Care Team.

## 2021-10-04 NOTE — Progress Notes (Signed)
PT given trial on [facility] per MD order.  Pt states her breathing is "okay" at this time.  NO distress noted.  Pt WOB appears to be the same on [place] as when she was on bipap.  RN in room and aware.

## 2021-10-04 NOTE — Progress Notes (Signed)
[facility] Progress Note Patient Name: [name] DOB: May 10, 1961 MRN: [id number]   Date of Service  10/04/2021  HPI/Events of Note  Hemoglobin down to 7.0 gm from 8.4 gm, no evidence of active bleeding.  eICU Interventions  Type and cross patient, monitor hemoglobin and hemodynamics closely for instability suggesting active bleeding.        Kerry Kass Mirtie Bastyr 10/04/2021, 2:42 AM

## 2021-10-04 NOTE — TOC Initial Note (Addendum)
Transition of Care [facility]) - Initial/Assessment Note    Patient Details  Name: [name] MRN: [id number] Date of Birth: Mar 04, 1961  Transition of [facility]) CM/SW Contact:    Bethena Roys, RN Phone Number: 10/04/2021, 4:21 PM  Clinical Narrative:  Risk for readmission assessment completed. Prior to arrival patient was from home with her grandsons and plans to return to his home in [place] as she progresses. Patient was previously active with [facility] for PT/OT services. Case Manager did call [facility] to make them aware of hospitalization. Patient has oxygen  and rollator via [employer]. Case Manager called [employer] to verify oxygen and liter flow via the company-Awaiting call back. Case Manager will continue to follow for additional transition of care needs.              Expected Discharge Plan: [place] Barriers to Discharge: Continued Medical Work up   Patient Goals and CMS Choice Patient states their goals for this hospitalization and ongoing recovery are:: plan      Expected Discharge Plan and Services Expected Discharge Plan: [place] In-house Referral: NA Discharge Planning Services: CM Consult Post Acute Care Choice: Resumption of Svcs/PTA Provider, Home Health Living arrangements for the past 2 months: Single Family Home                           HH Arranged: PT, OT HH Agency: [facility] Date [facility] Agency Contacted: 10/04/21 Time HH Agency Contacted: 1620 Representative spoke with at [place]: Tommi Rumps  Prior Living Arrangements/Services Living arrangements for the past 2 months: [place] with:: Relatives (Patient states she lives with her grandsons.) Patient language and need for interpreter reviewed:: Yes Do you feel safe going back to the place where you live?: Yes      Need for Family Participation in Patient Care: Yes (Comment) Care giver support system in place?: Yes  (comment) Current home services: DME (Patient states she has oxygen and rolling walker in the home.) Criminal Activity/Legal Involvement Pertinent to Current Situation/Hospitalization: No - Comment as needed  Activities of Daily Living Home Assistive Devices/Equipment: Oxygen, Dentures (specify type) ADL Screening (condition at time of admission) Patient's cognitive ability adequate to safely complete daily activities?: Yes Is the patient deaf or have difficulty hearing?: No Does the patient have difficulty seeing, even when wearing glasses/contacts?: No Does the patient have difficulty concentrating, remembering, or making decisions?: No Patient able to express need for assistance with ADLs?: Yes Does the patient have difficulty dressing or bathing?: No Independently performs ADLs?: Yes (appropriate for developmental age) Does the patient have difficulty walking or climbing stairs?: No Weakness of Legs: None Weakness of Arms/Hands: None  Permission Sought/Granted Permission sought to share information with : Family Supports, Customer service manager, Case Optician, dispensing granted to share information with : Yes, Verbal Permission Granted     Permission granted to share info w AGENCY: [facility], [employer]        Emotional Assessment Appearance:: Appears stated age Attitude/Demeanor/Rapport: Engaged Affect (typically observed): Appropriate Orientation: : Oriented to Self, Oriented to Place, Oriented to  Time, Oriented to Situation Alcohol / Substance Use: Not Applicable Psych Involvement: No (comment)  Admission diagnosis:  Acute pulmonary edema (HCC) [J81.0] Hypertensive urgency [I16.0] Patient Active Problem List   Diagnosis Date Noted   Acute pulmonary edema ([place])    DNR (do not resuscitate)    COPD with  acute exacerbation ([place]) 09/29/2021   Hypervolemia associated with renal insufficiency 09/29/2021   Dyslipidemia 09/29/2021   Hypothyroidism (acquired) 09/29/2021    Goals of care, counseling/discussion 09/29/2021   Chronic pain disorder 09/29/2021   COPD (chronic obstructive pulmonary disease) ([place]) 09/18/2021   Acute on chronic anemia    Nausea without vomiting    Acute hypoxemic respiratory failure ([place]) 08/16/2021   Dark stools    Diverticulosis    Adverse reaction to antiplatelet agent, sequela    AKI (acute kidney injury) ([place]) 05/11/2021   Chest pain, rule out acute myocardial infarction 05/09/2021   Abnormal EKG    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[place]) 12/30/2020   Chronic mesenteric ischemia ([place]) 12/28/2020   Polyp of ascending colon    AVM (arteriovenous malformation) of small bowel, acquired with hemorrhage    GIB (gastrointestinal bleeding) 08/23/2020   Hematochezia    Diverticulosis of colon without hemorrhage    Pain of upper abdomen    Angiodysplasia of colon 08/16/2020   Acute GI bleeding 08/05/2020   Acute blood loss anemia 08/05/2020   Renal artery stenosis (HCC) 05/10/2020   Abdominal aortic stenosis 11/30/2018   Pure hypercholesterolemia 10/22/2018   Abnormal stress test 03/21/2018   At high risk for injury related to fall 12/26/2017   Renal artery stenosis, native, bilateral ([place]) 10/27/2017   Benign neoplasm of transverse colon    Benign neoplasm of descending colon    Benign neoplasm of sigmoid colon    Abnormality on screening test 06/22/2016   Hypertensive urgency 11/01/2015   Chest pain 11/01/2015   History of partial colectomy 10/19/2014   Renovascular hypertension, malignant 09/07/2014   Anemia, chronic disease 07/30/2014   Community acquired pneumonia 07/02/2014   Heart failure with preserved ejection fraction ([place]) 07/02/2014   COPD GOLD 0 / still smoking 07/02/2014   CKD (chronic kidney disease) stage 4, GFR 15-29 ml/min ([place]) 07/02/2014   Protein-calorie malnutrition, severe ([place]) 07/02/2014   Hyperparathyroidism, secondary renal ([place]) 04/30/2014   Postsurgical menopause 08/14/2013   Vaginal atrophy  08/14/2013   Screening for breast cancer 07/21/2012   Diverticulitis of colon with perforation 06/18/2012   Affective bipolar disorder ([place]) 02/02/2009   ADHESIONS, INTESTINAL W/OBSTRUCTION 08/10/2008   Irritable bowel syndrome 08/10/2008   Abdominal pain, generalized 08/10/2008   FECAL OCCULT BLOOD 08/10/2008   Anemia, iron deficiency 08/10/2008   Anxiety state 08/09/2008   DEPRESSION 08/09/2008   Essential hypertension 08/09/2008   ASTHMA 08/09/2008   ESOPHAGEAL STRICTURE 08/09/2008   GERD 08/09/2008   HIATAL HERNIA 08/09/2008   Diverticulosis of large intestine 08/09/2008   ARTHRITIS 08/09/2008   HEADACHE, CHRONIC 08/09/2008   SKIN CANCER, HX OF 08/09/2008   COLONIC POLYPS, HYPERPLASTIC, HX OF 08/09/2008   Cigarette smoker 05/17/2008   Schizophrenia ([place]) 04/30/2008   PCP:  Bernerd Limbo, MD Pharmacy:   [place], [place], [place] [address], [place] [address] Phone: [phone] Fax: [phone]  [place], [place] [address] [place] [address] Phone: [phone] Fax: [phone]  [facility] [address] [place] [address] Phone: [phone] Fax: [place], [place] [address] Phone: [phone] Fax: [phone]   Readmission Risk Interventions Readmission Risk Prevention Plan 10/04/2021  Transportation Screening Complete  Medication Review Press photographer)  Complete  PCP or Specialist appointment within 3-5 days of discharge Complete  HRI or [place] Complete  SW Recovery Care/Counseling Consult Complete  [place] Not Applicable  Some recent data might be hidden

## 2021-10-05 DIAGNOSIS — I160 Hypertensive urgency: Secondary | ICD-10-CM | POA: Diagnosis not present

## 2021-10-05 LAB — RENAL FUNCTION PANEL
Albumin: 3 g/dL — ABNORMAL LOW (ref 3.5–5.0)
Albumin: 3.1 g/dL — ABNORMAL LOW (ref 3.5–5.0)
Anion gap: 11 (ref 5–15)
Anion gap: 7 (ref 5–15)
BUN: 21 mg/dL — ABNORMAL HIGH (ref 6–20)
BUN: 13 mg/dL (ref 6–20)
CO2: 22 mmol/L (ref 22–32)
CO2: 27 mmol/L (ref 22–32)
Calcium: 8.3 mg/dL — ABNORMAL LOW (ref 8.9–10.3)
Calcium: 8.3 mg/dL — ABNORMAL LOW (ref 8.9–10.3)
Chloride: 102 mmol/L (ref 98–111)
Chloride: 101 mmol/L (ref 98–111)
Creatinine, Ser: 1.34 mg/dL — ABNORMAL HIGH (ref 0.44–1.00)
Creatinine, Ser: 1.05 mg/dL — ABNORMAL HIGH (ref 0.44–1.00)
GFR, Estimated: 46 mL/min — ABNORMAL LOW (ref >60)
GFR, Estimated: >60 mL/min (ref >60)
Glucose, Bld: 81 mg/dL (ref 70–99)
Glucose, Bld: 107 mg/dL — ABNORMAL HIGH (ref 70–99)
Phosphorus: 2.6 mg/dL (ref 2.5–4.6)
Phosphorus: 2.2 mg/dL — ABNORMAL LOW (ref 2.5–4.6)
Potassium: 4.3 mmol/L (ref 3.5–5.1)
Potassium: 4.2 mmol/L (ref 3.5–5.1)
Sodium: 135 mmol/L (ref 135–145)
Sodium: 135 mmol/L (ref 135–145)

## 2021-10-05 LAB — BPAM RBC
Blood Product Expiration Date: 202301162359
ISSUE DATE / TIME: 202301041226
Unit Type and Rh: 9500

## 2021-10-05 LAB — CBC
HCT: 29.4 % — ABNORMAL LOW (ref 36.0–46.0)
Hemoglobin: 9.7 g/dL — ABNORMAL LOW (ref 12.0–15.0)
MCH: 32 pg (ref 26.0–34.0)
MCHC: 33 g/dL (ref 30.0–36.0)
MCV: 97 fL (ref 80.0–100.0)
Platelets: 214 10*3/uL (ref 150–400)
RBC: 3.03 MIL/uL — ABNORMAL LOW (ref 3.87–5.11)
RDW: 18 % — ABNORMAL HIGH (ref 11.5–15.5)
WBC: 11.4 10*3/uL — ABNORMAL HIGH (ref 4.0–10.5)
nRBC: 0 % (ref 0.0–0.2)

## 2021-10-05 LAB — TYPE AND SCREEN
ABO/RH(D): O NEG
Antibody Screen: NEGATIVE
Unit division: 0

## 2021-10-05 LAB — TRIGLYCERIDES: Triglycerides: 130 mg/dL (ref <150)

## 2021-10-05 LAB — MAGNESIUM: Magnesium: 2.4 mg/dL (ref 1.7–2.4)

## 2021-10-05 MED ORDER — AMLODIPINE BESYLATE 10 MG PO TABS
10.0000 mg | ORAL_TABLET | Freq: Every day | ORAL | Status: DC
  Administered 2021-10-06 – 2021-10-12 (×7): 10 mg via ORAL
  Filled 2021-10-05 (×7): qty 1

## 2021-10-05 MED ORDER — CLEVIDIPINE BUTYRATE 0.5 MG/ML IV EMUL
0.0000 mg/h | INTRAVENOUS | Status: AC
  Administered 2021-10-05: 1 mg/h via INTRAVENOUS
  Filled 2021-10-05 (×2): qty 50

## 2021-10-05 MED ORDER — DARBEPOETIN ALFA 100 MCG/0.5ML IJ SOSY
100.0000 ug | PREFILLED_SYRINGE | INTRAMUSCULAR | Status: DC
  Administered 2021-10-05: 100 ug via INTRAVENOUS
  Filled 2021-10-05 (×2): qty 0.5

## 2021-10-05 MED ORDER — SODIUM PHOSPHATES 45 MMOLE/15ML IV SOLN
15.0000 mmol | Freq: Once | INTRAVENOUS | Status: AC
  Administered 2021-10-05: 15 mmol via INTRAVENOUS
  Filled 2021-10-05: qty 5

## 2021-10-05 MED ORDER — DARBEPOETIN ALFA 100 MCG/0.5ML IJ SOSY: 100.0000 ug | PREFILLED_SYRINGE | Freq: Once | INTRAMUSCULAR | Status: DC

## 2021-10-05 MED ORDER — CALCITRIOL 0.5 MCG PO CAPS
0.5000 ug | ORAL_CAPSULE | ORAL | Status: DC
  Administered 2021-10-06 – 2021-10-09 (×2): 0.5 ug via ORAL
  Filled 2021-10-05 (×2): qty 2

## 2021-10-05 MED ORDER — DARBEPOETIN ALFA 100 MCG/0.5ML IJ SOSY: 100.0000 ug | PREFILLED_SYRINGE | INTRAMUSCULAR | Status: DC

## 2021-10-05 NOTE — Progress Notes (Signed)
NAME:  [name], MRN:  [id number], DOB:  1961/06/15, LOS: 2 ADMISSION DATE:  10/03/2021, CONSULTATION DATE:  10/03/21 REFERRING MD:  Kommor CHIEF COMPLAINT:  Dyspnea   History of Present Illness:  [name] is a 61 y.o. female who has a PMH as outlined below including but not limited to ESRD on HD MWF (last session reportedly Monday 10/02/2021), HTN, HLD, COPD, chronic hypoxic respiratory failure on 2 L nasal cannula, DNR status.  She was just admitted 09/29/2021 through 10/01/2021 for dyspnea felt to be due to volume overload and possible COPD exacerbation.  She had routine HD and was discharged home 1/1.  She then presented to [facility] ED 1/3 with dyspnea again.  Chest x-ray consistent with pulmonary edema.  Blood pressure elevated with systolics in the 585I.  She was started on nitroglycerin and placed on BiPAP.  Renal was consulted and planned for overnight HD session for volume removal.  Due to nitroglycerin and BiPAP, PCCM asked to evaluate for admission to ICU.  Pertinent  Medical History:  has Anxiety state; DEPRESSION; Essential hypertension; ASTHMA; ESOPHAGEAL STRICTURE; GERD; HIATAL HERNIA; ADHESIONS, INTESTINAL W/OBSTRUCTION; Diverticulosis of large intestine; Irritable bowel syndrome; ARTHRITIS; HEADACHE, CHRONIC; Abdominal pain, generalized; FECAL OCCULT BLOOD; SKIN CANCER, HX OF; Anemia, iron deficiency; COLONIC POLYPS, HYPERPLASTIC, HX OF; Community acquired pneumonia; Heart failure with preserved ejection fraction ([place]); COPD GOLD 0 / still smoking; CKD (chronic kidney disease) stage 4, GFR 15-29 ml/min ([place]); Affective bipolar disorder ([place]); Schizophrenia ([place]); Cigarette smoker; Protein-calorie malnutrition, severe ([place]); Renovascular hypertension, malignant; Hypertensive urgency; Chest pain; Benign neoplasm of transverse colon; Benign neoplasm of descending colon; Benign neoplasm of sigmoid colon; Renal artery stenosis, native, bilateral ([place]); Abnormal stress test; Abnormality on  screening test; Anemia, chronic disease; Screening for breast cancer; At high risk for injury related to fall; Diverticulitis of colon with perforation; History of partial colectomy; Hyperparathyroidism, secondary renal ([place]); Postsurgical menopause; Pure hypercholesterolemia; Vaginal atrophy; Abdominal aortic stenosis; Renal artery stenosis ([place]); Acute GI bleeding; Acute blood loss anemia; GIB (gastrointestinal bleeding); Hematochezia; Diverticulosis of colon without hemorrhage; Pain of upper abdomen; Polyp of ascending colon; AVM (arteriovenous malformation) of small bowel, acquired with hemorrhage; Angiodysplasia of colon; Chronic mesenteric ischemia ([place]); 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[place]); Chest pain, rule out acute myocardial infarction; Abnormal EKG; AKI (acute kidney injury) ([place]); Dark stools; Diverticulosis; Adverse reaction to antiplatelet agent, sequela; Acute hypoxemic respiratory failure ([place]); Acute on chronic anemia; Nausea without vomiting; COPD (chronic obstructive pulmonary disease) ([place]); COPD with acute exacerbation ([place]); Hypervolemia associated with renal insufficiency; Dyslipidemia; Hypothyroidism (acquired); Goals of care, counseling/discussion; Chronic pain disorder; Acute pulmonary edema ([place]); and DNR (do not resuscitate) on their problem list.  Significant Hospital Events: Including procedures, antibiotic start and stop dates in addition to other pertinent events   1/3 admit to ICU for AHRF on BIPAP and urgent dialysis for HTN emergency 1/4 CRRT  Interim History / Subjective:  Weaned off Precedex. Remains on CRRT  Objective:  Blood pressure (!) 137/112, pulse 83, temperature (!) 97.2 F (36.2 C), temperature source Oral, resp. rate (!) 25, height 5\' 1"  (1.549 m), weight 40.8 kg, SpO2 95 %.    FiO2 (%):  [40 %] 40 %   Intake/Output Summary (Last 24 hours) at 10/05/2021 0923 Last data filed at 10/05/2021 0900 Gross per 24 hour  Intake 1374.07 ml  Output 4310 ml  Net  -2935.93 ml   Filed Weights   10/04/21 0333 10/05/21 0630  Weight: 51.7 kg 40.8 kg    Physical  Exam: General: Chronically ill-appearing, cachectic, no acute distress HENT: [place], AT, OP clear, MMM Eyes: EOMI, no scleral icterus Respiratory: Diminished breath sounds bilaterally.  No crackles, wheezing or rales Cardiovascular: RRR, -M/R/G, no JVD GI: BS+, soft, nontender Extremities:-Edema,-tenderness Neuro: AAO x4, CNII-XII grossly intact Lines: Right per cath, LUE +thrill and bruit  CXR 1/4 Right dialysis cath in place. Diffuse bilateral edema and pleural effusions   Labs/imaging personally reviewed:  CXR 1/3 > pulmonary edema.  Assessment & Plan:   Acute pulmonary edema secondary to hypertensive urgency Acute on chronic hypoxemic respiratory; 3L home O2 for COPD Active smoker - Wean supplemental oxygen for goal SpO2 - Continue CRRT until it clost - Change home BD to nebulizers  Hypertensive urgency - unclear etiology.  She reportedly completed a full dialysis session yesterday 1/2. Hx HTN, HLD, CAD, AS s/p distal aortic stent 2019. - Wean off cleviprex gtt for SBP <160 - Dialysis for volume removal - Resume home anti-hypertensive agents: Imdur, Norvasc, increased amlodipine  Acute blood loss anemia - no active bleeding S/p PRBC x 1 1/4 - Transfuse for goal >7  ESRD on HD MWF (last session Mon 1/2).  Per chart review, she has considered stopping HD altogether. - Nephrology following - Further / ongoing HD discussions per nephrology.  Hx bipolar disorder, anxiety, chronic pain. - Continue home Effexor, Trazodone, Megace, Percocet, xanax  DNR status -with patient today and noted on recent admission including meeting with palliative care. - Continue supportive care. - Offered palliative care. Patient declined for now   Best practice (evaluated daily):  Diet/type: dysphagia diet (see orders) DVT prophylaxis: prophylactic heparin  GI prophylaxis: PPI Lines:  N/A Foley:  N/A Code Status:  DNR Last date of multidisciplinary goals of care discussion: None yet.  Critical care time: 36 min.   The patient is critically ill with multiple organ systems failure and requires high complexity decision making for assessment and support, frequent evaluation and titration of therapies, application of advanced monitoring technologies and extensive interpretation of multiple databases.  Independent Critical Care Time: 35 Minutes.   Rodman Pickle, M.D. [facility] Pulmonary/Critical Care Medicine 10/05/2021 9:23 AM   Please see Amion for pager number to reach on-call Pulmonary and Critical Care Team.

## 2021-10-05 NOTE — Progress Notes (Signed)
[facility] Progress Note  Name: [name] MRN: [id number] DOB: 10-Feb-1961  Chief Complaint:  Shortness of breath   Subjective:  Patient started on CRRT on 1/4 am.  She has been net negative 100 ml/hr UF goal and had 3.8 liters UF over 1/4 with CRRT.  BIPAP yesterday and overnight and now on heated high flow. Breathing is better and she is grateful.  She has only been on oxygen for the past few weeks - 2-3 liters at home.  She has had copd for 5 years.   Review of systems:  Shortness of breath is better  Chest pain yesterday - now resolved  No n/v   -------------- Background  [name] is a/an 61 y.o. female with a past medical history of ESRD on HD, hypertension, hyperlipidemia, COPD, h/o AS, chronic hypoxic respiratory failure on chronic 2 L nasal cannula who presents with shortness of breath.  She was recently admitted here from 12/30 to 10/01/2021 for dyspnea likely related to volume overload and possible COPD exacerbation.  She was discharged on 1/1, she did go to her HD unit yesterday on 1/2.  The plan previously was to adjust her dry weight further, however it seems that her dry weight was the same as it was before at yesterday's HD session. Chest x-ray here was consistent with pulmonary edema.  She was also found to be in hypertensive urgency with systolic blood pressures up in the 180s.  She was placed on BiPAP and nitroglycerin drip.  She currently feels slightly better in regards to her breathing. Plan is to do HD tonight. Denies any fevers, chills, chest pain, swelling.  Intake/Output Summary (Last 24 hours) at 10/05/2021 0559 Last data filed at 10/05/2021 0500 Gross per 24 hour  Intake 1024.6 ml  Output 3764 ml  Net -2739.4 ml    Vitals:  Vitals:   10/05/21 0415 10/05/21 0430 10/05/21 0445 10/05/21 0500  BP: (!) 157/75 (!) 160/74 (!) 164/86 (!) 171/75  Pulse: 66 67 69 67  Resp: 14 13 16 16   Temp:      TempSrc:      SpO2: 97% 99% 97% 100%   Weight:      Height:         Physical Exam:  General adult female in bed short of breath with exertion HEENT normocephalic atraumatic extraocular movements intact sclera anicteric Neck supple trachea midline Lungs diminished breath sounds; wheezing and increased work of breathing with exertion but not at rest; improved from prior; on heated high flow Heart S1S2 no rub Abdomen soft nontender nondistended Extremities no edema lower extremities Psych normal mood and affect Access RIJ tunn catheter and LUE AVG bruit and thrill   Medications reviewed   Labs:  BMP Latest Ref Rng & Units 10/04/2021 10/04/2021 10/03/2021  Glucose 70 - 99 mg/dL 110(H) 208(H) -  BUN 6 - 20 mg/dL 35(H) 46(H) -  Creatinine 0.44 - 1.00 mg/dL 1.92(H) 2.74(H) -  BUN/Creat Ratio 9 - 23 - - -  Sodium 135 - 145 mmol/L 134(L) 132(L) 133(L)  Potassium 3.5 - 5.1 mmol/L 4.7 4.7 4.4  Chloride 98 - 111 mmol/L 99 95(L) -  CO2 22 - 32 mmol/L 25 25 -  Calcium 8.9 - 10.3 mg/dL 8.7(L) 8.6(L) -   -outpatient orders:  MWF [name]  3h 88min   EDW 40.8kg   3K/2.5 bath   Hep none   TDC/ LUA AVG done 09/14/21   Calcitriol 0.5 mcg  She got mircera  75 mcg on 09/15/21   Assessment/Plan:   # ESRD:  - usually HD is per MWF schedule  - she had CRRT starting 1/4.   - Continue CRRT for now.  If clots after 4 pm today will not restart.  Net negative 100 ml/hr - Will also plan for transition to iHD for Sat treatment then resume her usual MWF schedule after that - daily weights - standing if able to tolerate   # AHRF, Pulmonary edema, underlying COPD -UF as tolerated; will need new EDW   # Volume/ hypertensive urgency:  - EDW 40.8kg originally, post tx wt on 12/30 was 37.8kg, can challenge further.  Note that her charted weight of 51.7 kg appears unlikely accurate -resume home anti-HTNs   # COPD - per pulmonology  - unsure of her baseline respiratory status but suspect she has limited reserve  # Anemia of Chronic  Kidney Disease: note Hb drop.  PRBC's on 10/04/21.  Start aranesp 100 mcg on 10/05/21 - ordered weekly  # Secondary Hyperparathyroidism/Hyperphosphatemia: stopped home binders as starting CRRT. Phos ok.    Disposition - in ICU - cannot come to the HD unit  Claudia Desanctis, MD 10/05/2021 6:31 AM

## 2021-10-06 DIAGNOSIS — I160 Hypertensive urgency: Secondary | ICD-10-CM | POA: Diagnosis not present

## 2021-10-06 LAB — RENAL FUNCTION PANEL
Albumin: 3 g/dL — ABNORMAL LOW (ref 3.5–5.0)
Anion gap: 9 (ref 5–15)
BUN: 15 mg/dL (ref 6–20)
CO2: 27 mmol/L (ref 22–32)
Calcium: 8.5 mg/dL — ABNORMAL LOW (ref 8.9–10.3)
Chloride: 100 mmol/L (ref 98–111)
Creatinine, Ser: 1.16 mg/dL — ABNORMAL HIGH (ref 0.44–1.00)
GFR, Estimated: 55 mL/min — ABNORMAL LOW (ref >60)
Glucose, Bld: 94 mg/dL (ref 70–99)
Phosphorus: 3.6 mg/dL (ref 2.5–4.6)
Potassium: 4.6 mmol/L (ref 3.5–5.1)
Sodium: 136 mmol/L (ref 135–145)

## 2021-10-06 LAB — CBC
HCT: 30.5 % — ABNORMAL LOW (ref 36.0–46.0)
Hemoglobin: 9.8 g/dL — ABNORMAL LOW (ref 12.0–15.0)
MCH: 31.4 pg (ref 26.0–34.0)
MCHC: 32.1 g/dL (ref 30.0–36.0)
MCV: 97.8 fL (ref 80.0–100.0)
Platelets: 193 10*3/uL (ref 150–400)
RBC: 3.12 MIL/uL — ABNORMAL LOW (ref 3.87–5.11)
RDW: 18 % — ABNORMAL HIGH (ref 11.5–15.5)
WBC: 10.7 10*3/uL — ABNORMAL HIGH (ref 4.0–10.5)
nRBC: 0 % (ref 0.0–0.2)

## 2021-10-06 LAB — MAGNESIUM: Magnesium: 2.3 mg/dL (ref 1.7–2.4)

## 2021-10-06 MED ORDER — LOSARTAN POTASSIUM 25 MG PO TABS
25.0000 mg | ORAL_TABLET | Freq: Every day | ORAL | Status: DC
  Administered 2021-10-06: 25 mg via ORAL
  Filled 2021-10-06: qty 1

## 2021-10-06 MED ORDER — ISOSORBIDE MONONITRATE ER 60 MG PO TB24
60.0000 mg | ORAL_TABLET | Freq: Every day | ORAL | Status: DC
  Administered 2021-10-06 – 2021-10-12 (×7): 60 mg via ORAL
  Filled 2021-10-06 (×7): qty 1

## 2021-10-06 MED ORDER — CLEVIDIPINE BUTYRATE 0.5 MG/ML IV EMUL
0.0000 mg/h | INTRAVENOUS | Status: AC
  Administered 2021-10-06: 2 mg/h via INTRAVENOUS
  Filled 2021-10-06: qty 50

## 2021-10-06 MED ORDER — CHLORHEXIDINE GLUCONATE CLOTH 2 % EX PADS
6.0000 | MEDICATED_PAD | Freq: Every day | CUTANEOUS | Status: DC
  Administered 2021-10-06 – 2021-10-12 (×6): 6 via TOPICAL

## 2021-10-06 MED ORDER — OXYCODONE-ACETAMINOPHEN 5-325 MG PO TABS
1.0000 | ORAL_TABLET | Freq: Four times a day (QID) | ORAL | Status: DC | PRN
  Administered 2021-10-06 – 2021-10-12 (×15): 1 via ORAL
  Filled 2021-10-06 (×14): qty 1

## 2021-10-06 NOTE — TOC Progression Note (Signed)
Transition of Care [facility]) - Progression Note    Patient Details  Name: [name] MRN: [id number] Date of Birth: November 20, 1960  Transition of Care [facility]) CM/SW Contact  Graves-Bigelow, Ocie Cornfield, RN Phone Number: 10/06/2021, 4:09 PM  Clinical Narrative: Case Manager verified with [employer] that the patient is on 3 liters of oxygen at home. Patient will need PT/OT resumption orders and F2F once stable. Case Manager will continue to follow for disposition needs.     Expected Discharge Plan: [place] Barriers to Discharge: Continued Medical Work up  Expected Discharge Plan and Services Expected Discharge Plan: [place] In-house Referral: NA Discharge Planning Services: CM Consult Post Acute Care Choice: Resumption of Svcs/PTA Provider, Home Health Living arrangements for the past 2 months: Single Family Home                   HH Arranged: PT, OT [facility] Agency: [place] Date [facility] Agency Contacted: 10/04/21 Time [place]: 1620 Representative spoke with at [place]: [place]    Readmission Risk Interventions Readmission Risk Prevention Plan 10/04/2021  Transportation Screening Complete  Medication Review Press photographer) Complete  PCP or Specialist appointment within 3-5 days of discharge Complete  HRI or [place] Complete  SW Recovery Care/Counseling Consult Complete  [place] Not Applicable  Some recent data might be hidden

## 2021-10-06 NOTE — Progress Notes (Signed)
NAME:  [name], MRN:  [id number], DOB:  11-11-1960, LOS: 3 ADMISSION DATE:  10/03/2021, CONSULTATION DATE:  10/03/21 REFERRING MD:  Kommor CHIEF COMPLAINT:  Dyspnea   History of Present Illness:  [name] is a 61 y.o. female who has a PMH as outlined below including but not limited to ESRD on HD MWF (last session reportedly Monday 10/02/2021), HTN, HLD, COPD, chronic hypoxic respiratory failure on 2 L nasal cannula, DNR status.  She was just admitted 09/29/2021 through 10/01/2021 for dyspnea felt to be due to volume overload and possible COPD exacerbation.  She had routine HD and was discharged home 1/1.  She then presented to [facility] ED 1/3 with dyspnea again.  Chest x-ray consistent with pulmonary edema.  Blood pressure elevated with systolics in the 263Z.  She was started on nitroglycerin and placed on BiPAP.  Renal was consulted and planned for overnight HD session for volume removal.  Due to nitroglycerin and BiPAP, PCCM asked to evaluate for admission to ICU.  Pertinent  Medical History:  has Anxiety state; DEPRESSION; Essential hypertension; ASTHMA; ESOPHAGEAL STRICTURE; GERD; HIATAL HERNIA; ADHESIONS, INTESTINAL W/OBSTRUCTION; Diverticulosis of large intestine; Irritable bowel syndrome; ARTHRITIS; HEADACHE, CHRONIC; Abdominal pain, generalized; FECAL OCCULT BLOOD; SKIN CANCER, HX OF; Anemia, iron deficiency; COLONIC POLYPS, HYPERPLASTIC, HX OF; Community acquired pneumonia; Heart failure with preserved ejection fraction ([place]); COPD GOLD 0 / still smoking; CKD (chronic kidney disease) stage 4, GFR 15-29 ml/min ([place]); Affective bipolar disorder ([place]); Schizophrenia ([place]); Cigarette smoker; Protein-calorie malnutrition, severe ([place]); Renovascular hypertension, malignant; Hypertensive urgency; Chest pain; Benign neoplasm of transverse colon; Benign neoplasm of descending colon; Benign neoplasm of sigmoid colon; Renal artery stenosis, native, bilateral ([place]); Abnormal stress test; Abnormality on  screening test; Anemia, chronic disease; Screening for breast cancer; At high risk for injury related to fall; Diverticulitis of colon with perforation; History of partial colectomy; Hyperparathyroidism, secondary renal ([place]); Postsurgical menopause; Pure hypercholesterolemia; Vaginal atrophy; Abdominal aortic stenosis; Renal artery stenosis ([place]); Acute GI bleeding; Acute blood loss anemia; GIB (gastrointestinal bleeding); Hematochezia; Diverticulosis of colon without hemorrhage; Pain of upper abdomen; Polyp of ascending colon; AVM (arteriovenous malformation) of small bowel, acquired with hemorrhage; Angiodysplasia of colon; Chronic mesenteric ischemia ([place]); 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[place]); Chest pain, rule out acute myocardial infarction; Abnormal EKG; AKI (acute kidney injury) ([place]); Dark stools; Diverticulosis; Adverse reaction to antiplatelet agent, sequela; Acute hypoxemic respiratory failure ([place]); Acute on chronic anemia; Nausea without vomiting; COPD (chronic obstructive pulmonary disease) ([place]); COPD with acute exacerbation ([place]); Hypervolemia associated with renal insufficiency; Dyslipidemia; Hypothyroidism (acquired); Goals of care, counseling/discussion; Chronic pain disorder; Acute pulmonary edema ([place]); and DNR (do not resuscitate) on their problem list.  Significant Hospital Events: Including procedures, antibiotic start and stop dates in addition to other pertinent events   1/3 admit to ICU for AHRF on BIPAP and urgent dialysis for HTN emergency 1/4 CRRT 1/6 of CRRT, off Precedex, off Cleviprex  Interim History / Subjective:  Reports improving shortness of breath  Objective:  Blood pressure (!) 187/89, pulse 88, temperature 98.1 F (36.7 C), temperature source Oral, resp. rate (!) 23, height 5\' 1"  (1.549 m), weight 35.8 kg, SpO2 100 %.    FiO2 (%):  [40 %] 40 %   Intake/Output Summary (Last 24 hours) at 10/06/2021 1335 Last data filed at 10/06/2021 1200 Gross per 24 hour  Intake  1231.08 ml  Output 3718 ml  Net -2486.92 ml    Filed Weights   10/04/21 0333 10/05/21 0630 10/06/21 0600  Weight:  51.7 kg 40.8 kg 35.8 kg    Physical Exam: General: Chronically ill-appearing, cachectic, no acute distress HENT: [place], AT, OP clear, MMM Eyes: EOMI, no scleral icterus Respiratory: Diminished breath sounds bilaterally.  No crackles, wheezing or rales.  Appears hyperinflated Cardiovascular: RRR, -M/R/G, no JVD GI: BS+, soft, nontender Extremities:-Edema,-tenderness Neuro: AAO x4, CNII-XII grossly intact Lines: Right per cath, LUE +thrill and bruit  CXR 1/4 Right dialysis cath in place. Diffuse bilateral edema and pleural effusions   Labs/imaging personally reviewed:  CXR 1/3 > pulmonary edema.  Assessment & Plan:   Acute pulmonary edema secondary to hypertensive urgency Acute on chronic hypoxemic respiratory; 3L home O2 for COPD Active smoker Hypertensive urgency Hx HTN, HLD, CAD, AS s/p distal aortic stent 2019. Acute blood loss anemia - no active bleeding ESRD on HD MWF (last session Mon 1/2).  Per chart review, she has considered stopping HD altogether. Hx bipolar disorder, anxiety, chronic pain. DNR status -  Plan:  -Ready for transfer to medicine service.  Orders reconciled and TRH notified -Needs intensification of antihypertensive regimen.  Nephrology has introduced losartan and increased Imdur. -Currently on oxygen at home rate.  No active wheezing.  Continue current bronchodilator therapy. -Hemodialysis as per nephrology  Best practice (evaluated daily):  Diet/type: dysphagia diet (see orders) DVT prophylaxis: prophylactic heparin  GI prophylaxis: PPI Lines: N/A Foley:  N/A Code Status:  DNR Last date of multidisciplinary goals of care discussion: None yet.  35 minutes spent with greater than 50% of time in counseling and coordination of care.  Cheyenne Brood, MD [facility] ICU Physician [place]  Pager: [phone] Or Epic  Secure Chat After hours: [phone].  10/06/2021, 1:38 PM

## 2021-10-06 NOTE — Progress Notes (Signed)
[facility] Progress Note  Name: [name] MRN: [id number] DOB: 1960-10-20  Chief Complaint:  Shortness of breath   Subjective:  She had 5.4 liters UF over 1/5 with CRRT.  She has only been on oxygen for the past few weeks - 2-3 liters at home.  She has trouble telling me what blood pressure medication she's on at home.  Used to be on clonidine but states not anymore.  She had a rash and "couldn't breathe" with hydralazine.  She's been on and off of 4 liters and bipap overnight.  Our bipap is different than what she has at home.  Mask here not comfortable.   Review of systems:  Shortness of breath is better  No chest pain  No n/v   -------------- Background  [name] is a/an 61 y.o. female with a past medical history of ESRD on HD, hypertension, hyperlipidemia, COPD, h/o AS, chronic hypoxic respiratory failure on chronic 2 L nasal cannula who presents with shortness of breath.  She was recently admitted here from 12/30 to 10/01/2021 for dyspnea likely related to volume overload and possible COPD exacerbation.  She was discharged on 1/1, she did go to her HD unit yesterday on 1/2.  The plan previously was to adjust her dry weight further, however it seems that her dry weight was the same as it was before at yesterday's HD session. Chest x-ray here was consistent with pulmonary edema.  She was also found to be in hypertensive urgency with systolic blood pressures up in the 180s.  She was placed on BiPAP and nitroglycerin drip.  She currently feels slightly better in regards to her breathing. Plan is to do HD tonight. Denies any fevers, chills, chest pain, swelling.  Intake/Output Summary (Last 24 hours) at 10/06/2021 0552 Last data filed at 10/06/2021 0400 Gross per 24 hour  Intake 1659.6 ml  Output 5583 ml  Net -3923.4 ml    Vitals:  Vitals:   10/06/21 0300 10/06/21 0331 10/06/21 0400 10/06/21 0500  BP:  (!) 157/112 (!) 185/102 (!) 186/83  Pulse:  87 87 (!)  162  Resp:  (!) 28 (!) 24 18  Temp: 98.9 F (37.2 C)     TempSrc: Oral     SpO2:  100% 100% 94%  Weight:      Height:         Physical Exam:  General adult female in chair on bipap HEENT normocephalic atraumatic extraocular movements intact sclera anicteric Neck supple trachea midline Lungs unlabored at rest on bipap. Wheezing noted Heart S1S2 no rub Abdomen soft nontender nondistended Extremities no edema lower extremities Psych normal mood and affect Access RIJ tunn catheter and LUE AVG bruit and thrill   Medications reviewed   Labs:  BMP Latest Ref Rng & Units 10/06/2021 10/05/2021 10/05/2021  Glucose 70 - 99 mg/dL 94 107(H) 81  BUN 6 - 20 mg/dL 15 13 21(H)  Creatinine 0.44 - 1.00 mg/dL 1.16(H) 1.05(H) 1.34(H)  BUN/Creat Ratio 9 - 23 - - -  Sodium 135 - 145 mmol/L 136 135 135  Potassium 3.5 - 5.1 mmol/L 4.6 4.2 4.3  Chloride 98 - 111 mmol/L 100 101 102  CO2 22 - 32 mmol/L 27 27 22   Calcium 8.9 - 10.3 mg/dL 8.5(L) 8.3(L) 8.3(L)   -outpatient orders:  MWF [name]  3h 14min   EDW 40.8kg   3K/2.5 bath   Hep none   TDC/ LUA AVG done 09/14/21   Calcitriol 0.5 mcg  She got mircera 75 mcg on 09/15/21   Assessment/Plan:   # ESRD:  - usually HD is per MWF schedule  - she had CRRT starting 1/4.  Stop CRRT now   - Will plan for transition to iHD for Sat treatment then resume her usual MWF schedule after that - daily weights - standing if able to tolerate   # AHRF, Pulmonary edema, underlying COPD -UF as tolerated; will need new EDW   # Hypertensive urgency:  - with flash pulm edema - EDW 40.8kg originally, post tx wt on 12/30 was 37.8kg.  Note that her charted weight of 51.7 kg appears unlikely accurate.  Her last weight is listed as 40.8 kg - Increase isosorbide mononitrate to 60 mg daily  - start losartan 25 mg daily and anticipate need to titration    # COPD - per pulmonology  - unsure of her baseline respiratory status but suspect she has limited  reserve  # Anemia of Chronic Kidney Disease: note Hb drop.  S/p PRBC's on 10/04/21.  aranesp 100 mcg weekly on thursdays   # Secondary Hyperparathyroidism/Hyperphosphatemia: stopped home binders as starting CRRT. Phos ok. On calcitriol     Claudia Desanctis, MD 10/06/2021 6:10 AM

## 2021-10-07 LAB — RENAL FUNCTION PANEL
Albumin: 2.5 g/dL — ABNORMAL LOW (ref 3.5–5.0)
Anion gap: 10 (ref 5–15)
BUN: 39 mg/dL — ABNORMAL HIGH (ref 6–20)
CO2: 23 mmol/L (ref 22–32)
Calcium: 8.1 mg/dL — ABNORMAL LOW (ref 8.9–10.3)
Chloride: 94 mmol/L — ABNORMAL LOW (ref 98–111)
Creatinine, Ser: 2.57 mg/dL — ABNORMAL HIGH (ref 0.44–1.00)
GFR, Estimated: 21 mL/min — ABNORMAL LOW (ref >60)
Glucose, Bld: 75 mg/dL (ref 70–99)
Phosphorus: 4.4 mg/dL (ref 2.5–4.6)
Potassium: 4.8 mmol/L (ref 3.5–5.1)
Sodium: 127 mmol/L — ABNORMAL LOW (ref 135–145)

## 2021-10-07 LAB — MAGNESIUM: Magnesium: 1.9 mg/dL (ref 1.7–2.4)

## 2021-10-07 LAB — HEPATITIS B SURFACE ANTIGEN: Hepatitis B Surface Ag: NONREACTIVE

## 2021-10-07 LAB — HEPATITIS B SURFACE ANTIBODY,QUALITATIVE: Hep B S Ab: NONREACTIVE

## 2021-10-07 LAB — VITAMIN C: Vitamin C: 0.2 mg/dL — ABNORMAL LOW (ref 0.4–2.0)

## 2021-10-07 MED ORDER — HEPARIN SODIUM (PORCINE) 1000 UNIT/ML DIALYSIS: 1000.0000 [IU] | INTRAMUSCULAR | Status: DC | PRN

## 2021-10-07 MED ORDER — LIDOCAINE-PRILOCAINE 2.5-2.5 % EX CREA: 1.0000 "application " | TOPICAL_CREAM | CUTANEOUS | Status: DC | PRN

## 2021-10-07 MED ORDER — ALTEPLASE 2 MG IJ SOLR: 2.0000 mg | Freq: Once | INTRAMUSCULAR | Status: DC | PRN

## 2021-10-07 MED ORDER — HEPARIN SODIUM (PORCINE) 1000 UNIT/ML IJ SOLN
INTRAMUSCULAR | Status: AC
  Filled 2021-10-07: qty 4

## 2021-10-07 MED ORDER — SODIUM CHLORIDE 0.9 % IV SOLN: 100.0000 mL | INTRAVENOUS | Status: DC | PRN

## 2021-10-07 MED ORDER — PENTAFLUOROPROP-TETRAFLUOROETH EX AERO: 1.0000 "application " | INHALATION_SPRAY | CUTANEOUS | Status: DC | PRN

## 2021-10-07 MED ORDER — LOSARTAN POTASSIUM 25 MG PO TABS
25.0000 mg | ORAL_TABLET | Freq: Once | ORAL | Status: AC
  Administered 2021-10-07: 25 mg via ORAL
  Filled 2021-10-07: qty 1

## 2021-10-07 MED ORDER — CALCIUM ACETATE (PHOS BINDER) 667 MG PO CAPS
667.0000 mg | ORAL_CAPSULE | Freq: Three times a day (TID) | ORAL | Status: DC
  Administered 2021-10-07 – 2021-10-12 (×14): 667 mg via ORAL
  Filled 2021-10-07 (×14): qty 1

## 2021-10-07 MED ORDER — FOLIC ACID 1 MG PO TABS
1.0000 mg | ORAL_TABLET | Freq: Every day | ORAL | Status: DC
  Administered 2021-10-08 – 2021-10-12 (×5): 1 mg via ORAL
  Filled 2021-10-07 (×6): qty 1

## 2021-10-07 MED ORDER — LIDOCAINE HCL (PF) 1 % IJ SOLN: 5.0000 mL | INTRAMUSCULAR | Status: DC | PRN

## 2021-10-07 MED ORDER — CLONIDINE HCL 0.1 MG PO TABS
0.1000 mg | ORAL_TABLET | Freq: Once | ORAL | Status: AC
  Administered 2021-10-07: 0.1 mg via ORAL
  Filled 2021-10-07: qty 1

## 2021-10-07 MED ORDER — LOSARTAN POTASSIUM 50 MG PO TABS
50.0000 mg | ORAL_TABLET | Freq: Every day | ORAL | Status: DC
  Administered 2021-10-08 – 2021-10-12 (×5): 50 mg via ORAL
  Filled 2021-10-07 (×5): qty 1

## 2021-10-07 NOTE — Progress Notes (Signed)
PROGRESS NOTE    [name]  YDX:[id number] DOB: 1961-02-08 DOA: 10/03/2021 PCP: Bernerd Limbo, MD    Chief Complaint  Patient presents with   Respiratory Distress    Brief Narrative:  61 year old female with a medical history significant for ESRD on HD Monday Wednesday Friday, hypertension, hyperlipidemia, COPD, chronic hypoxic respiratory failure necessitating 2 L nasal cannula who presents to [facility] on 1/4 with dyspnea.  Found to have hypertension urgency with flash pulmonary edema,  She was put on nitroglycerin drip/BiPAP admitted to ICU, she received urgent dialysis Condition improved she is transferred to hospitalist service on 1/7   of note: She was just admitted 09/29/2021 through 10/01/2021 for dyspnea felt to be due to volume overload and possible COPD exacerbation.  She had routine HD and was discharged home 1/1.  Subjective:   Sitting up in chair, reports still has some productive cough, wheezing, sob Denies chest pain, no edema No fever  Assessment & Plan:   Principal Problem:   Hypertensive urgency  Acute on chronic hypxic respiratory failure/pulm hypertension emergency acute flash pulmonary edema Could also have component of COPD exacerbation -She received urgent dialysis initially required BiPAP -Has much improved, now O2 requirement back to baseline -Still have some short of breath and wheezing -Start nebulizer, short course of oral prednisone  HTN -Present with hypertension emergency required nitro drip and ICU admission -Currently on Norvasc, Imdur,Cozaar  ESRD on HD MWF On CRRT initially Management per nephrology   Acute on chronic anemia Likely combination of anemia of chronic disease plus slow GI loss Per chart review patient had recent EGD/colonoscopy and capsule endoscopy showed normal esophagus/stomach/small bowel, + colon: Erosion versus polyps nonbleeding Gi recommend daily PPI She received prbcx1 on 1/4, getting ESA from nephrology  Hx  bipolar disorder, anxiety, chronic pain. - Continue home Effexor, Trazodone,  Percocet  Falls From home with family, reports has a walker at home Will start PT eval   BMI is: Body mass index is 15.95 kg/m.Marland Kitchen Seen by dietician.  I agree with the assessment and plan as outlined below: Nutrition Status: Nutrition Problem: Severe Malnutrition Etiology: chronic illness (ESRD/HD, COPD on  home oxygen, CHF) Signs/Symptoms: severe fat depletion, severe muscle depletion Interventions: Boost Breeze, MVI, Refer to RD note for recommendations  .     Unresulted Labs (From admission, onward)     Start     Ordered   10/07/21 1504  Hepatitis B surface antigen  (New Admission Hemo Labs (Hepatitis B))  Once,   R       Question:  Specimen collection method  Answer:  Lab=Lab collect   10/07/21 1503   10/07/21 1504  Hepatitis B surface antibody  (New Admission Hemo Labs (Hepatitis B))  Once,   R       Question:  Specimen collection method  Answer:  Lab=Lab collect   10/07/21 1503   10/07/21 1504  Hepatitis B surface antibody,quantitative  (New Admission Hemo Labs (Hepatitis B))  Once,   R       Question:  Specimen collection method  Answer:  Lab=Lab collect   10/07/21 1503   10/05/21 0500  Triglycerides  Every 72 hours,   R     Comments: While pt on clevidipine   Question:  Specimen collection method  Answer:  Lab=Lab collect   10/04/21 1929   10/04/21 1546  Vitamin C  Once,   R       Question:  Specimen collection method  Answer:  Lab=Lab collect  10/04/21 1547   Unscheduled  Occult blood card to lab, stool  As needed,   R      10/07/21 1626              DVT prophylaxis: Place and maintain sequential compression device Start: 10/07/21 1627   Code Status: DNR Family Communication: Patient Disposition:   Status is: Inpatient  Dispo: The patient is from: Home              Anticipated d/c is to: Likely need home health              Anticipated d/c date is: 24 to 48 hours,  pending on respiratory status, therapy eval                Consultants:  Critical care Nephrology  Procedures:  BiPAP Dialysis  Antimicrobials:   Anti-infectives (From admission, onward)    None           Objective: Vitals:   10/07/21 1506 10/07/21 1520 10/07/21 1530 10/07/21 1600  BP: 133/63 128/75 127/61 129/74  Pulse: 92 99 81 86  Resp: (!) 24     Temp: (!) 97.3 F (36.3 C)     TempSrc:      SpO2: 100%     Weight: 38.3 kg     Height:        Intake/Output Summary (Last 24 hours) at 10/07/2021 1654 Last data filed at 10/07/2021 1100 Gross per 24 hour  Intake 947.73 ml  Output --  Net 947.73 ml   Filed Weights   10/06/21 0600 10/07/21 0455 10/07/21 1506  Weight: 35.8 kg 36.1 kg 38.3 kg    Examination:  General exam: chronically ill appearing , thin, malnourished , alert, awake, communicative, Respiratory system:  overall very diminished with scattered wheezing,  Respiratory effort normal. Cardiovascular system:  RRR.  Gastrointestinal system: Abdomen is nondistended, soft and nontender.  Normal bowel sounds heard. Central nervous system: Alert and oriented. No focal neurological deficits. Extremities:  no edema Skin: No rashes, lesions or ulcers Psychiatry: Judgement and insight appear normal. Mood & affect appropriate.     Data Reviewed: I have personally reviewed following labs and imaging studies  CBC: Recent Labs  Lab 10/03/21 2023 10/03/21 2029 10/04/21 0126 10/04/21 1833 10/05/21 0542 10/06/21 0116  WBC 14.1*  --  7.6 11.2* 11.4* 10.7*  NEUTROABS 13.5*  --   --   --   --   --   HGB 8.4* 9.2* 7.0* 9.6* 9.7* 9.8*  HCT 26.7* 27.0* 22.3* 29.2* 29.4* 30.5*  MCV 97.8  --  97.4 95.1 97.0 97.8  PLT 291  --  229 246 214 474    Basic Metabolic Panel: Recent Labs  Lab 10/04/21 0126 10/04/21 1603 10/05/21 0542 10/05/21 1636 10/06/21 0116 10/07/21 0048  NA 132* 134* 135 135 136 127*  K 4.7 4.7 4.3 4.2 4.6 4.8  CL 95* 99 102 101 100 94*   CO2 25 25 22 27 27 23   GLUCOSE 208* 110* 81 107* 94 75  BUN 46* 35* 21* 13 15 39*  CREATININE 2.74* 1.92* 1.34* 1.05* 1.16* 2.57*  CALCIUM 8.6* 8.7* 8.3* 8.3* 8.5* 8.1*  MG 1.8  --  2.4  --  2.3 1.9  PHOS 3.9 3.4 2.6 2.2* 3.6 4.4    GFR: Estimated Creatinine Clearance: 14.4 mL/min (A) (by C-G formula based on SCr of 2.57 mg/dL (H)).  Liver Function Tests: Recent Labs  Lab 10/03/21 2023 10/04/21 1603 10/05/21 0542  10/05/21 1636 10/06/21 0116 10/07/21 0048  AST 41  --   --   --   --   --   ALT 55*  --   --   --   --   --   ALKPHOS 158*  --   --   --   --   --   BILITOT 1.2  --   --   --   --   --   PROT 6.4*  --   --   --   --   --   ALBUMIN 3.5 3.1* 3.0* 3.1* 3.0* 2.5*    CBG: Recent Labs  Lab 10/04/21 0001 10/04/21 1942  GLUCAP 114* 99     Recent Results (from the past 240 hour(s))  Resp Panel by RT-PCR (Flu A&B, Covid) Nasopharyngeal Swab     Status: None   Collection Time: 09/29/21  6:53 AM   Specimen: Nasopharyngeal Swab; Nasopharyngeal(NP) swabs in vial transport medium  Result Value Ref Range Status   SARS Coronavirus 2 by RT PCR NEGATIVE NEGATIVE Final    Comment: (NOTE) SARS-CoV-2 target nucleic acids are NOT DETECTED.  The SARS-CoV-2 RNA is generally detectable in upper respiratory specimens during the acute phase of infection. The lowest concentration of SARS-CoV-2 viral copies this assay can detect is 138 copies/mL. A negative result does not preclude SARS-Cov-2 infection and should not be used as the sole basis for treatment or other patient management decisions. A negative result may occur with  improper specimen collection/handling, submission of specimen other than nasopharyngeal swab, presence of viral mutation(s) within the areas targeted by this assay, and inadequate number of viral copies(<138 copies/mL). A negative result must be combined with clinical observations, patient history, and epidemiological information. The expected result is  Negative.  Fact Sheet for Patients:  EntrepreneurPulse.com.au  Fact Sheet for Healthcare Providers:  IncredibleEmployment.be  This test is no t yet approved or cleared by the [place] FDA and  has been authorized for detection and/or diagnosis of SARS-CoV-2 by FDA under an Emergency Use Authorization (EUA). This EUA will remain  in effect (meaning this test can be used) for the duration of the COVID-19 declaration under Section 564(b)(1) of the Act, 21 U.S.C.section 360bbb-3(b)(1), unless the authorization is terminated  or revoked sooner.       Influenza A by PCR NEGATIVE NEGATIVE Final   Influenza B by PCR NEGATIVE NEGATIVE Final    Comment: (NOTE) The Xpert Xpress SARS-CoV-2/FLU/RSV plus assay is intended as an aid in the diagnosis of influenza from Nasopharyngeal swab specimens and should not be used as a sole basis for treatment. Nasal washings and aspirates are unacceptable for Xpert Xpress SARS-CoV-2/FLU/RSV testing.  Fact Sheet for Patients: EntrepreneurPulse.com.au  Fact Sheet for Healthcare Providers: IncredibleEmployment.be  This test is not yet approved or cleared by the [place] FDA and has been authorized for detection and/or diagnosis of SARS-CoV-2 by FDA under an Emergency Use Authorization (EUA). This EUA will remain in effect (meaning this test can be used) for the duration of the COVID-19 declaration under Section 564(b)(1) of the Act, 21 U.S.C. section 360bbb-3(b)(1), unless the authorization is terminated or revoked.  Performed at [facility], [place] [address], [place], [place] [address]   MRSA Next Gen by PCR, Nasal     Status: None   Collection Time: 10/04/21  2:49 AM   Specimen: Nasal Mucosa; Nasal Swab  Result Value Ref Range Status   MRSA by PCR Next Gen NOT DETECTED  NOT DETECTED Final    Comment: (NOTE) The GeneXpert MRSA Assay (FDA approved for NASAL  specimens only), is one component of a comprehensive MRSA colonization surveillance program. It is not intended to diagnose MRSA infection nor to guide or monitor treatment for MRSA infections. Test performance is not FDA approved in patients less than 2 years old. Performed at [facility], [place] [address], [place],  [address]          Radiology Studies: No results found.      Scheduled Meds:  amLODipine  10 mg Oral Daily   arformoterol  15 mcg Nebulization BID   atorvastatin  80 mg Oral QHS   budesonide (PULMICORT) nebulizer solution  0.25 mg Nebulization BID   calcitRIOL  0.5 mcg Oral Q M,W,F-HD   calcium acetate  667 mg Oral TID WC   Chlorhexidine Gluconate Cloth  6 each Topical Q0600   [START ON 10/12/2021] darbepoetin (ARANESP) injection - DIALYSIS  100 mcg Subcutaneous Q Thu-HD   ezetimibe  10 mg Oral Daily   feeding supplement  237 mL Oral TID BM   [START ON 10/07/735] folic acid  1 mg Oral Daily   isosorbide mononitrate  60 mg Oral Daily   linaclotide  72 mcg Oral QAC breakfast   loratadine  10 mg Oral Daily   [START ON 10/08/2021] losartan  50 mg Oral Daily   megestrol  40 mg Oral Daily   multivitamin  1 tablet Oral QHS   pantoprazole  40 mg Oral Daily   venlafaxine XR  300 mg Oral Q breakfast   vitamin B-12  500 mcg Oral Daily   Continuous Infusions:  sodium chloride 8 mL/hr at 10/06/21 1600   sodium chloride     sodium chloride       LOS: 4 days   Time spent: 82mins Greater than 50% of this time was spent in counseling, explanation of diagnosis, planning of further management, and coordination of care.   Voice Recognition Viviann Spare dictation system was used to create this note, attempts have been made to correct errors. Please contact the author with questions and/or clarifications.   Florencia Reasons, MD PhD FACP [facility]  Available via Epic secure chat 7am-7pm for nonurgent issues Please page for urgent issues To page the attending  provider between 7A-7P or the covering provider during after hours 7P-7A, please log into the web site [email or web address] and access using universal [place] password for that web site. If you do not have the password, please call the hospital operator.    10/07/2021, 4:54 PM

## 2021-10-07 NOTE — Progress Notes (Addendum)
[facility] Progress Note  Name: [name] MRN: [id number] DOB: Dec 23, 1960  Chief Complaint:  Shortness of breath   Subjective:  She had 5.4 liters UF over 1/5 with CRRT which was stopped on 1/6 am.  She wore oxygen overnight but not the bipap - mask here is harder to tolerate.  Several cups at bedside but per pt and RN just sipping.  She's awaiting transfer to the floor.  Bp had been better overnight - got up to the chair.  And has been 180-190's per RN.    Review of systems:  Some shortness of breath but overall much better some chest pain this am - resolved at this time No n/v   -------------- Background  [name] is a/an 61 y.o. female with a past medical history of ESRD on HD, hypertension, hyperlipidemia, COPD, h/o AS, chronic hypoxic respiratory failure on chronic 2 L nasal cannula who presents with shortness of breath.  She was recently admitted here from 12/30 to 10/01/2021 for dyspnea likely related to volume overload and possible COPD exacerbation.  She was discharged on 1/1, she did go to her HD unit yesterday on 1/2.  The plan previously was to adjust her dry weight further, however it seems that her dry weight was the same as it was before at yesterday's HD session. Chest x-ray here was consistent with pulmonary edema.  She was also found to be in hypertensive urgency with systolic blood pressures up in the 180s.  She was placed on BiPAP and nitroglycerin drip.  She currently feels slightly better in regards to her breathing. Plan is to do HD tonight. Denies any fevers, chills, chest pain, swelling.  Intake/Output Summary (Last 24 hours) at 10/07/2021 0601 Last data filed at 10/07/2021 0400 Gross per 24 hour  Intake 1257.98 ml  Output --  Net 1257.98 ml    Vitals:  Vitals:   10/07/21 0200 10/07/21 0300 10/07/21 0400 10/07/21 0455  BP: (!) 143/62 (!) 147/61 134/74   Pulse: 78 78 86   Resp: 14 13 19    Temp:   98.7 F (37.1 C)   TempSrc:    Oral   SpO2: 100% 100% 100%   Weight:    36.1 kg  Height:         Physical Exam:  General adult female in chair on 4 liters HEENT normocephalic atraumatic extraocular movements intact sclera anicteric Neck supple trachea midline Lungs decreased breath sounds; occ wheeze; unlabored at rest on 4 liters Heart S1S2 no rub Abdomen soft nontender nondistended Extremities no edema lower extremities Psych normal mood and affect Neuro - alert and oriented x 3 conversant  Access RIJ tunn catheter and LUE AVG bruit and thrill   Medications reviewed   Labs:  BMP Latest Ref Rng & Units 10/07/2021 10/06/2021 10/05/2021  Glucose 70 - 99 mg/dL 75 94 107(H)  BUN 6 - 20 mg/dL 39(H) 15 13  Creatinine 0.44 - 1.00 mg/dL 2.57(H) 1.16(H) 1.05(H)  BUN/Creat Ratio 9 - 23 - - -  Sodium 135 - 145 mmol/L 127(L) 136 135  Potassium 3.5 - 5.1 mmol/L 4.8 4.6 4.2  Chloride 98 - 111 mmol/L 94(L) 100 101  CO2 22 - 32 mmol/L 23 27 27   Calcium 8.9 - 10.3 mg/dL 8.1(L) 8.5(L) 8.3(L)   -outpatient orders:  MWF [name]  3h 54min   EDW 40.8kg   3K/2.5 bath   Hep none   TDC/ LUA AVG done 09/14/21   Calcitriol 0.5 mcg  She got mircera 75 mcg on 09/15/21   Assessment/Plan:   # ESRD:  - usually HD is per MWF schedule.  S/p CRRT 1/4 - 1/6   - intermittent HD today then resume her usual MWF schedule after that - daily weights - standing if able to tolerate - added fluid restriction to 1.2 liters/day - 3-4 cups at bedside but states one is new and others only has sipped.   # AHRF, Pulmonary edema, underlying COPD -UF as tolerated; will need new EDW - nadir here is 35.8 kg    # Hypertensive urgency:  - with flash pulm edema - EDW 40.8kg originally, post tx wt on 12/30 was 37.8kg.  will need new EDW as nadir here is 35.8 kg  - Increased isosorbide mononitrate to 60 mg daily on 1/6 - started losartan 25 mg daily on 1/6 and anticipate need to titrate to 50 mg daily on 1/8 - will place order - clonidine 0.1 mg  once now    # COPD - per pulmonology  - unsure of her baseline respiratory status but suspect she has limited reserve  # Anemia of Chronic Kidney Disease: note Hb drop.  S/p PRBC's on 10/04/21.  aranesp 100 mcg weekly on thursdays   # Secondary Hyperparathyroidism/Hyperphosphatemia: stopped home phoslo when on CRRT. Will start back at 1 tablet with meals. On calcitriol   Disposition - HD at [facility] today then disposition per primary team.  She is able to come to the inpatient HD unit    Claudia Desanctis, MD 10/07/2021 6:18 AM

## 2021-10-07 NOTE — Progress Notes (Signed)
PT Cancellation Note  Patient Details Name: [name] MRN: [id number] DOB: 11-18-1960   Cancelled Treatment:    Reason Eval/Treat Not Completed: Patient at procedure or test/unavailable (HD). Will follow-up for PT Evaluation as schedule permits.  Mabeline Caras, PT, DPT Acute Rehabilitation Services  Pager [phone] Office [place] 10/07/2021, 5:27 PM

## 2021-10-08 ENCOUNTER — Inpatient Hospital Stay (HOSPITAL_COMMUNITY): Payer: Medicare Other

## 2021-10-08 LAB — HEPATITIS B SURFACE ANTIBODY, QUANTITATIVE: Hep B S AB Quant (Post): <3.1 m[IU]/mL — ABNORMAL LOW (ref >9.9)

## 2021-10-08 LAB — RENAL FUNCTION PANEL
Albumin: 2.1 g/dL — ABNORMAL LOW (ref 3.5–5.0)
Anion gap: 6 (ref 5–15)
BUN: 18 mg/dL (ref 6–20)
CO2: 28 mmol/L (ref 22–32)
Calcium: 7.8 mg/dL — ABNORMAL LOW (ref 8.9–10.3)
Chloride: 98 mmol/L (ref 98–111)
Creatinine, Ser: 1.84 mg/dL — ABNORMAL HIGH (ref 0.44–1.00)
GFR, Estimated: 31 mL/min — ABNORMAL LOW (ref >60)
Glucose, Bld: 112 mg/dL — ABNORMAL HIGH (ref 70–99)
Phosphorus: 3.1 mg/dL (ref 2.5–4.6)
Potassium: 3.6 mmol/L (ref 3.5–5.1)
Sodium: 132 mmol/L — ABNORMAL LOW (ref 135–145)

## 2021-10-08 LAB — TRIGLYCERIDES: Triglycerides: 50 mg/dL (ref <150)

## 2021-10-08 MED ORDER — CHLORHEXIDINE GLUCONATE CLOTH 2 % EX PADS
6.0000 | MEDICATED_PAD | Freq: Every day | CUTANEOUS | Status: DC
  Administered 2021-10-09: 6 via TOPICAL

## 2021-10-08 MED ORDER — GUAIFENESIN 100 MG/5ML PO LIQD
5.0000 mL | ORAL | Status: DC | PRN
  Administered 2021-10-08 (×2): 5 mL via ORAL
  Filled 2021-10-08 (×2): qty 5

## 2021-10-08 NOTE — Progress Notes (Signed)
[facility] Progress Note  Name: [name] MRN: [id number] DOB: [birth year]  Chief Complaint:  Shortness of breath   Subjective:  She had HD on 1/7 with 1.6 kg UF.  Still in ICU. No labs today.  Cramping yesterday with HD.  Sats are 100% on 4 liters.  On 2-3 liters at home but that's a new requirement  Review of systems:  shortness of breath is much better some chest tightness - improved Some nausea no vomiting   -------------- Background  [name] is a/an 61 y.o. female with a past medical history of ESRD on HD, hypertension, hyperlipidemia, COPD, h/o AS, chronic hypoxic respiratory failure on chronic 2 L nasal cannula who presents with shortness of breath.  She was recently admitted here from 12/30 to 10/01/2021 for dyspnea likely related to volume overload and possible COPD exacerbation.  She was discharged on 1/1, she did go to her HD unit yesterday on 1/2.  The plan previously was to adjust her dry weight further, however it seems that her dry weight was the same as it was before at yesterday's HD session. Chest x-ray here was consistent with pulmonary edema.  She was also found to be in hypertensive urgency with systolic blood pressures up in the 180s.  She was placed on BiPAP and nitroglycerin drip.  She currently feels slightly better in regards to her breathing. Plan is to do HD tonight. Denies any fevers, chills, chest pain, swelling.  Intake/Output Summary (Last 24 hours) at 10/08/2021 0602 Last data filed at 10/08/2021 0500 Gross per 24 hour  Intake 960 ml  Output 1547 ml  Net -587 ml    Vitals:  Vitals:   10/08/21 0000 10/08/21 0200 10/08/21 0400 10/08/21 0500  BP: 131/63 (!) 163/72 (!) 147/62   Pulse: 90 98 90   Resp: 12 20 13    Temp:   98.8 F (37.1 C)   TempSrc:   Oral   SpO2: 100% 100% 100%   Weight:    36.9 kg  Height:         Physical Exam:  General adult female in bed in NAD; chronically ill appearing HEENT normocephalic  atraumatic extraocular movements intact sclera anicteric Neck supple trachea midline Lungs decreased breath sounds; occ wheeze; unlabored at rest; sats 100% on 4 liters Heart S1S2 no rub Abdomen soft nontender nondistended Extremities no edema lower extremities Psych normal mood and affect Neuro - alert and oriented x 3 conversant  Access RIJ tunn catheter and LUE AVG bruit and thrill   Medications reviewed   Labs:  BMP Latest Ref Rng & Units 10/07/2021 10/06/2021 10/05/2021  Glucose 70 - 99 mg/dL 75 94 107(H)  BUN 6 - 20 mg/dL 39(H) 15 13  Creatinine 0.44 - 1.00 mg/dL 2.57(H) 1.16(H) 1.05(H)  BUN/Creat Ratio 9 - 23 - - -  Sodium 135 - 145 mmol/L 127(L) 136 135  Potassium 3.5 - 5.1 mmol/L 4.8 4.6 4.2  Chloride 98 - 111 mmol/L 94(L) 100 101  CO2 22 - 32 mmol/L 23 27 27   Calcium 8.9 - 10.3 mg/dL 8.1(L) 8.5(L) 8.3(L)   -outpatient orders:  MWF [name]  3h 5min   EDW 40.8kg   3K/2.5 bath   Hep none   TDC/ LUA AVG done 09/14/21   Calcitriol 0.5 mcg  She got mircera 75 mcg on 09/15/21   Assessment/Plan:   # ESRD:  - usually HD is per MWF schedule.  S/p CRRT 1/4 - 1/6 and then iHD on  1/7 - For next treatment resume MWF schedule (1/9) - daily weights - standing if able to tolerate - added fluid restriction to 1.2 liters/day  - I ordered a renal panel daily while here   # AHRF, Pulmonary edema, underlying COPD -UF as tolerated; will need new EDW - lowest weight here is 35.8 kg    # Hypertensive urgency:  - with flash pulm edema - EDW 40.8kg originally, post tx wt on 12/30 was 37.8kg.  will need new EDW as nadir here is 35.8 kg.  Despite being at her lowest weight she was still Hypertensive so medications were adjusted - Increased isosorbide mononitrate to 60 mg daily on 1/6 - started losartan 25 mg daily on 1/6 and titrating to 50 mg daily on 1/8   # COPD - per pulmonology  - sats are 100% on 4 liters - would titrate down.  Spoke with RN. oxygen saturation goals per  primary team  - unsure of her baseline respiratory status but suspect she has limited reserve  # Anemia of Chronic Kidney Disease: note Hb drop.  S/p PRBC's on 10/04/21.  aranesp 100 mcg weekly on thursdays   # Secondary Hyperparathyroidism/Hyperphosphatemia: earlier we stopped home phoslo when on CRRT. now back at 1 tablet with meals. On calcitriol   Disposition - disposition per primary team.  At this time she is able to come to the inpatient HD unit for treatments while she remains hospitalized     Claudia Desanctis, MD 10/08/2021 6:21 AM

## 2021-10-08 NOTE — Progress Notes (Signed)
Patient instructed on the use of a flutter valve. Patient able to demonstrate back good technique.

## 2021-10-08 NOTE — Evaluation (Signed)
Physical Therapy Evaluation Patient Details Name: [name] MRN: [id number] DOB: 10-29-1960 Today's Date: 10/08/2021  History of Present Illness  Pt is a 61 y.o. female admitted 10/03/21 with dyspnea. Workup for HTN urgency, flash pulmonary edema; pt required ICU admission for nitro drip. PMH includes ESRD (HD MWF), HTN, HLD, COPD (wears 2L O2 baseline), bipolar disorder, chronic pain. Of note, recent admission 09/29/21-10/01/21 for volume overload, COPD exacerbation.   Clinical Impression  Pt presents with an overall decrease in functional mobility secondary to above. PTA, pt limited household ambulator via furniture surfing, endorses multiple falls due to dizziness (she attributes this to HD); lives with grandson's family who assists with mobility and ADL/iADLs as needed. Today, pt mod indep with bed-level mobility, but declines OOB activity ("I'm hard headed... gonna do what I want to do"). Pt reports no interest for post-acute rehab ("If I lose my independence, I've lost everything"), but willing to consider HHPT services. Pt reports family able to continue providing necessary assist upon return home. Will follow acutely to address established goals.      Recommendations for follow up therapy are one component of a multi-disciplinary discharge planning process, led by the attending physician.  Recommendations may be updated based on patient status, additional functional criteria and insurance authorization.  Follow Up Recommendations Home health PT    Assistance Recommended at Discharge Frequent or constant Supervision/Assistance  Patient can return home with the following  A little help with walking and/or transfers;A little help with bathing/dressing/bathroom;Assistance with cooking/housework;Assist for transportation;Help with stairs or ramp for entrance;Direct supervision/assist for medications management;Direct supervision/assist for financial management    Equipment Recommendations  Wheelchair (measurements PT);Wheelchair cushion (measurements PT)  Recommendations for Other Services       Functional Status Assessment Patient has had a recent decline in their functional status and demonstrates the ability to make significant improvements in function in a reasonable and predictable amount of time.     Precautions / Restrictions Precautions Precautions: Fall;Other (comment) Precaution Comments: Watch HR, wears 2L O2 baseline Restrictions Weight Bearing Restrictions: No      Mobility  Bed Mobility Overal bed mobility: Modified Independent             General bed mobility comments: Indep to come to long sitting from flat bed, indep to come to sitting EOB    Transfers                   General transfer comment: pt agreeable to walk, but then declined getting OOB when it came time to stand    Ambulation/Gait                  Stairs            Wheelchair Mobility    Modified Rankin (Stroke Patients Only)       Balance Overall balance assessment: Needs assistance;History of Falls Sitting-balance support: Feet supported;No upper extremity supported Sitting balance-Leahy Scale: Good Sitting balance - Comments: can long sit and reach bilateral feet without assist; not tested at EOB                                     Pertinent Vitals/Pain Pain Assessment: 0-10 Pain Score: 7  Pain Location: back Pain Descriptors / Indicators: Constant;Discomfort Pain Intervention(s): Monitored during session;Limited activity within patient's tolerance    Home Living Family/patient expects to be discharged to:: Private residence Living  Arrangements: Other relatives Available Help at Discharge: Family;Available PRN/intermittently Type of Home: House Home Access: Stairs to enter Entrance Stairs-Rails: Right Entrance Stairs-Number of Steps: 5-6   Home Layout: One level Home Equipment: Cane - single point;Air cabin crew  (4 wheels) Additional Comments: Lives with grandson and his family for increased assist    Prior Function Prior Level of Function : Needs assist;Driving       Physical Assist : Mobility (physical);ADLs (physical) Mobility (physical): Stairs ADLs (physical): Bathing;IADLs Mobility Comments: Household ambulator without DME, will furniture surf; assist from family on stairs; reports ~6 falls in the past 2 months (pt reports due to dizziness, which she attributes to HD), grandson helps her stand. Drives, but hasn't in a while; family drives. ADLs Comments: Sits on edge of tub or toilet to perform bird bathing at sink, granddaughter assists if needed. Granddaughter also assists with med management     Hand Dominance   Dominant Hand: Right    Extremity/Trunk Assessment   Upper Extremity Assessment Upper Extremity Assessment: Generalized weakness    Lower Extremity Assessment Lower Extremity Assessment: Generalized weakness    Cervical / Trunk Assessment Cervical / Trunk Assessment: Kyphotic  Communication   Communication: No difficulties  Cognition Arousal/Alertness: Awake/alert Behavior During Therapy: WFL for tasks assessed/performed Overall Cognitive Status: No family/caregiver present to determine baseline cognitive functioning                                 General Comments: answering questions and following commands appropriately; question decreased insight and awareness into current condition        General Comments General comments (skin integrity, edema, etc.): HR 106 while long sitting in bed, SpO2 96% on 3L O2. Discussed potential need for w/c for community distances, since this has been limited for pt; pt interested    Exercises     Assessment/Plan    PT Assessment Patient needs continued PT services  PT Problem List Decreased strength;Decreased range of motion;Decreased activity tolerance;Decreased balance;Decreased mobility;Decreased  coordination;Decreased knowledge of use of DME;Cardiopulmonary status limiting activity       PT Treatment Interventions DME instruction;Gait training;Stair training;Functional mobility training;Therapeutic activities;Therapeutic exercise;Balance training;Patient/family education;Wheelchair mobility training    PT Goals (Current goals can be found in the Care Plan section)  Acute Rehab PT Goals Patient Stated Goal: Return home - "If I lose my independence, I've lost everything" PT Goal Formulation: With patient Time For Goal Achievement: 10/22/21 Potential to Achieve Goals: Good    Frequency Min 3X/week     Co-evaluation               AM-PAC PT "6 Clicks" Mobility  Outcome Measure Help needed turning from your back to your side while in a flat bed without using bedrails?: None Help needed moving from lying on your back to sitting on the side of a flat bed without using bedrails?: None Help needed moving to and from a bed to a chair (including a wheelchair)?: A Little Help needed standing up from a chair using your arms (e.g., wheelchair or bedside chair)?: A Little Help needed to walk in hospital room?: A Lot Help needed climbing 3-5 steps with a railing? : A Lot 6 Click Score: 18    End of Session Equipment Utilized During Treatment: Oxygen Activity Tolerance: Patient limited by fatigue Patient left: in bed;with call bell/phone within reach Nurse Communication: Mobility status;Other (comment) (pt declined ambulation) PT Visit Diagnosis: Other  abnormalities of gait and mobility (R26.89);Muscle weakness (generalized) (M62.81)    Time: 6606-0045 PT Time Calculation (min) (ACUTE ONLY): 27 min   Charges:   PT Evaluation $PT Eval Moderate Complexity: [place], PT, DPT Acute Rehabilitation Services  Pager [phone] Office [place] 10/08/2021, 1:13 PM

## 2021-10-08 NOTE — Plan of Care (Signed)

## 2021-10-08 NOTE — Progress Notes (Signed)
PROGRESS NOTE    [name]  FKC:[id number] DOB: 08/22/1961 DOA: 10/03/2021 PCP: Bernerd Limbo, MD    Chief Complaint  Patient presents with   Respiratory Distress    Brief Narrative:  61 year old female with a medical history significant for ESRD on HD Monday Wednesday Friday, hypertension, hyperlipidemia, COPD, chronic hypoxic respiratory failure necessitating 2 L nasal cannula who presents to [facility] on 1/4 with dyspnea.  Found to have hypertension urgency with flash pulmonary edema,  She was put on nitroglycerin drip/BiPAP admitted to ICU, she received urgent dialysis Condition improved she is transferred to hospitalist service on 1/7   of note: She was just admitted 09/29/2021 through 10/01/2021 for dyspnea felt to be due to volume overload and possible COPD exacerbation.  She had routine HD and was discharged home 1/1. She did not miss any dialysis  Subjective:  Had dialysis, reports "sore all over" due to dialysis On 4liter o2, reports baseline is 2liters,  She reports not feeling back to baseline either, reports productive cough with dark yellow sputum No fever  She prefers to stay on bipap at night, she reports this machine works better than her home cpap machine, will have case manager look into home bipap set up  Assessment & Plan:   Principal Problem:   Hypertensive urgency  Acute on chronic hypxic respiratory failure/pulm hypertension emergency acute flash pulmonary edema -might also have component of COPD exacerbation ( reports quit smoking in 07/2021) -She received urgent dialysis initially and required BiPAP -Has much improved, but o2 requirement is not back to baseline yet -Still have some short of breath , productive cough and intermittent wheezing -today I did not hear wheezing, will avoid steroids, reports productive cough with dark yellow sputum, will repeat cxr -wean oxygen , keep above 88%, order placed/discussed with RN at bedside   HTN -Present with  hypertension emergency required nitro drip and ICU admission -Currently on Norvasc, Imdur,Cozaar  ESRD on HD MWF TDC/ LUA AVG done 09/14/21 On CRRT initially Management per nephrology   Acute on chronic anemia Likely combination of anemia of chronic disease plus slow GI loss Per chart review patient had recent EGD/colonoscopy and capsule endoscopy showed normal esophagus/stomach/small bowel, + colon: Erosion versus polyps nonbleeding Gi recommend daily PPI She received prbcx1 on 1/4, getting ESA from nephrology  Hx bipolar disorder, anxiety, chronic pain. - Continue home Effexor, Trazodone,  Percocet  Falls From home with family, reports has a walker at home PT eval recommend home health   BMI is: Body mass index is 15.37 kg/m.Marland Kitchen Seen by dietician.  I agree with the assessment and plan as outlined below: Nutrition Status: Nutrition Problem: Severe Malnutrition Etiology: chronic illness (ESRD/HD, COPD on  home oxygen, CHF) Signs/Symptoms: severe fat depletion, severe muscle depletion Interventions: Boost Breeze, MVI, Refer to RD note for recommendations  .     Unresulted Labs (From admission, onward)     Start     Ordered   10/09/21 0500  Renal function panel  Daily,   R     Question:  Specimen collection method  Answer:  Lab=Lab collect   10/08/21 0607   10/09/21 0500  Procalcitonin - Baseline  Tomorrow morning,   R       Question:  Specimen collection method  Answer:  Lab=Lab collect   10/08/21 1609   10/09/21 0500  CBC with Differential/Platelet  Tomorrow morning,   R       Question:  Specimen collection method  Answer:  Lab=Lab  collect   10/08/21 1610   10/08/21 1609  Expectorated Sputum Assessment w Gram Stain, Rflx to Resp Cult  Once,   R        10/08/21 1608   10/05/21 0500  Triglycerides  Every 72 hours,   R     Comments: While pt on clevidipine   Question:  Specimen collection method  Answer:  Lab=Lab collect   10/04/21 1929   Unscheduled  Occult blood card  to lab, stool  As needed,   R      10/07/21 1626              DVT prophylaxis: Place and maintain sequential compression device Start: 10/07/21 1627   Code Status: DNR Family Communication: Patient Disposition:   Status is: Inpatient  Dispo: The patient is from: Home              Anticipated d/c is to: home with  home health              Anticipated d/c date is: 24 to 48 hours, pending on respiratory status, wean oxygen, cxr, home bipap set up?                Consultants:  Critical care Nephrology  Procedures:  BiPAP Dialysis  Antimicrobials:   Anti-infectives (From admission, onward)    None         Objective: Vitals:   10/08/21 1950 10/08/21 2000 10/08/21 2100 10/08/21 2200  BP: 121/76 (!) 156/72 (!) 170/74 (!) 135/114  Pulse: 94 99 96 92  Resp: (!) 22 (!) 26 (!) 27 20  Temp:  98.4 F (36.9 C)    TempSrc:  Oral    SpO2: 96% 100% 90% 96%  Weight:      Height:        Intake/Output Summary (Last 24 hours) at 10/08/2021 2243 Last data filed at 10/08/2021 2000 Gross per 24 hour  Intake 940.16 ml  Output --  Net 940.16 ml   Filed Weights   10/07/21 1506 10/07/21 1900 10/08/21 0500  Weight: 38.3 kg 37.7 kg 36.9 kg    Examination:  General exam: chronically ill appearing , thin, malnourished , alert, awake, communicative, TDC right chest, AVG left arm Respiratory system:  lung exam today appears better, more air movement, no wheezing currently,  though still has tachypnea with mininal exertion in bed, no accessory muscle use Cardiovascular system:  RRR.  Gastrointestinal system: Abdomen is nondistended, soft and nontender.  Normal bowel sounds heard. Central nervous system: Alert and oriented. No focal neurological deficits. Extremities:  no edema Skin: No rashes, lesions or ulcers Psychiatry: Judgement and insight appear normal. Mood & affect appropriate.     Data Reviewed: I have personally reviewed following labs and imaging  studies  CBC: Recent Labs  Lab 10/03/21 2023 10/03/21 2029 10/04/21 0126 10/04/21 1833 10/05/21 0542 10/06/21 0116  WBC 14.1*  --  7.6 11.2* 11.4* 10.7*  NEUTROABS 13.5*  --   --   --   --   --   HGB 8.4* 9.2* 7.0* 9.6* 9.7* 9.8*  HCT 26.7* 27.0* 22.3* 29.2* 29.4* 30.5*  MCV 97.8  --  97.4 95.1 97.0 97.8  PLT 291  --  229 246 214 378    Basic Metabolic Panel: Recent Labs  Lab 10/04/21 0126 10/04/21 1603 10/05/21 0542 10/05/21 1636 10/06/21 0116 10/07/21 0048 10/08/21 0604  NA 132*   < > 135 135 136 127* 132*  K 4.7   < >  4.3 4.2 4.6 4.8 3.6  CL 95*   < > 102 101 100 94* 98  CO2 25   < > 22 27 27 23 28   GLUCOSE 208*   < > 81 107* 94 75 112*  BUN 46*   < > 21* 13 15 39* 18  CREATININE 2.74*   < > 1.34* 1.05* 1.16* 2.57* 1.84*  CALCIUM 8.6*   < > 8.3* 8.3* 8.5* 8.1* 7.8*  MG 1.8  --  2.4  --  2.3 1.9  --   PHOS 3.9   < > 2.6 2.2* 3.6 4.4 3.1   < > = values in this interval not displayed.    GFR: Estimated Creatinine Clearance: 19.4 mL/min (A) (by C-G formula based on SCr of 1.84 mg/dL (H)).  Liver Function Tests: Recent Labs  Lab 10/03/21 2023 10/04/21 1603 10/05/21 0542 10/05/21 1636 10/06/21 0116 10/07/21 0048 10/08/21 0604  AST 41  --   --   --   --   --   --   ALT 55*  --   --   --   --   --   --   ALKPHOS 158*  --   --   --   --   --   --   BILITOT 1.2  --   --   --   --   --   --   PROT 6.4*  --   --   --   --   --   --   ALBUMIN 3.5   < > 3.0* 3.1* 3.0* 2.5* 2.1*   < > = values in this interval not displayed.    CBG: Recent Labs  Lab 10/04/21 0001 10/04/21 1942  GLUCAP 114* 99     Recent Results (from the past 240 hour(s))  Resp Panel by RT-PCR (Flu A&B, Covid) Nasopharyngeal Swab     Status: None   Collection Time: 09/29/21  6:53 AM   Specimen: Nasopharyngeal Swab; Nasopharyngeal(NP) swabs in vial transport medium  Result Value Ref Range Status   SARS Coronavirus 2 by RT PCR NEGATIVE NEGATIVE Final    Comment: (NOTE) SARS-CoV-2  target nucleic acids are NOT DETECTED.  The SARS-CoV-2 RNA is generally detectable in upper respiratory specimens during the acute phase of infection. The lowest concentration of SARS-CoV-2 viral copies this assay can detect is 138 copies/mL. A negative result does not preclude SARS-Cov-2 infection and should not be used as the sole basis for treatment or other patient management decisions. A negative result may occur with  improper specimen collection/handling, submission of specimen other than nasopharyngeal swab, presence of viral mutation(s) within the areas targeted by this assay, and inadequate number of viral copies(<138 copies/mL). A negative result must be combined with clinical observations, patient history, and epidemiological information. The expected result is Negative.  Fact Sheet for Patients:  EntrepreneurPulse.com.au  Fact Sheet for Healthcare Providers:  IncredibleEmployment.be  This test is no t yet approved or cleared by the [place] FDA and  has been authorized for detection and/or diagnosis of SARS-CoV-2 by FDA under an Emergency Use Authorization (EUA). This EUA will remain  in effect (meaning this test can be used) for the duration of the COVID-19 declaration under Section 564(b)(1) of the Act, 21 U.S.C.section 360bbb-3(b)(1), unless the authorization is terminated  or revoked sooner.       Influenza A by PCR NEGATIVE NEGATIVE Final   Influenza B by PCR NEGATIVE NEGATIVE Final    Comment: (NOTE)  The Xpert Xpress SARS-CoV-2/FLU/RSV plus assay is intended as an aid in the diagnosis of influenza from Nasopharyngeal swab specimens and should not be used as a sole basis for treatment. Nasal washings and aspirates are unacceptable for Xpert Xpress SARS-CoV-2/FLU/RSV testing.  Fact Sheet for Patients: EntrepreneurPulse.com.au  Fact Sheet for Healthcare  Providers: IncredibleEmployment.be  This test is not yet approved or cleared by the [place] FDA and has been authorized for detection and/or diagnosis of SARS-CoV-2 by FDA under an Emergency Use Authorization (EUA). This EUA will remain in effect (meaning this test can be used) for the duration of the COVID-19 declaration under Section 564(b)(1) of the Act, 21 U.S.C. section 360bbb-3(b)(1), unless the authorization is terminated or revoked.  Performed at [facility], [place] [address], [place], [place] [address]   MRSA Next Gen by PCR, Nasal     Status: None   Collection Time: 10/04/21  2:49 AM   Specimen: Nasal Mucosa; Nasal Swab  Result Value Ref Range Status   MRSA by PCR Next Gen NOT DETECTED NOT DETECTED Final    Comment: (NOTE) The GeneXpert MRSA Assay (FDA approved for NASAL specimens only), is one component of a comprehensive MRSA colonization surveillance program. It is not intended to diagnose MRSA infection nor to guide or monitor treatment for MRSA infections. Test performance is not FDA approved in patients less than 41 years old. Performed at [facility], [place] [address], [place], [place] [address]          Radiology Studies: DG Chest 2 View  Result Date: 10/08/2021 CLINICAL DATA:  Respiratory distress.  Cough. EXAM: CHEST - 2 VIEW COMPARISON:  10/04/2021, CT 10/01/2021 FINDINGS: Right-sided dialysis catheter remains in place. Grossly unchanged cardiomegaly. Worsening right but slight improvement in left pleural effusion, as well as associated bibasilar opacities. Diffuse interstitial opacities suspicious for pulmonary edema, mildly improved. No pneumothorax. IMPRESSION: 1. Worsening right but slight improvement in left pleural effusion, as well as associated basilar opacities. 2. Stable cardiomegaly. Mildly improved interstitial opacities likely improving edema. Electronically Signed   By: Keith Rake M.D.   On: 10/08/2021  17:37        Scheduled Meds:  amLODipine  10 mg Oral Daily   arformoterol  15 mcg Nebulization BID   atorvastatin  80 mg Oral QHS   budesonide (PULMICORT) nebulizer solution  0.25 mg Nebulization BID   calcitRIOL  0.5 mcg Oral Q M,W,F-HD   calcium acetate  667 mg Oral TID WC   Chlorhexidine Gluconate Cloth  6 each Topical Q0600   [START ON 10/09/2021] Chlorhexidine Gluconate Cloth  6 each Topical Q0600   [START ON 10/12/2021] darbepoetin (ARANESP) injection - DIALYSIS  100 mcg Subcutaneous Q Thu-HD   ezetimibe  10 mg Oral Daily   feeding supplement  237 mL Oral TID BM   folic acid  1 mg Oral Daily   isosorbide mononitrate  60 mg Oral Daily   linaclotide  72 mcg Oral QAC breakfast   loratadine  10 mg Oral Daily   losartan  50 mg Oral Daily   megestrol  40 mg Oral Daily   multivitamin  1 tablet Oral QHS   pantoprazole  40 mg Oral Daily   venlafaxine XR  300 mg Oral Q breakfast   vitamin B-12  500 mcg Oral Daily   Continuous Infusions:  sodium chloride Stopped (10/06/21 1929)     LOS: 5 days   Time spent: 75mins Greater than 50% of this time  was spent in counseling, explanation of diagnosis, planning of further management, and coordination of care.   Voice Recognition Viviann Spare dictation system was used to create this note, attempts have been made to correct errors. Please contact the author with questions and/or clarifications.   Florencia Reasons, MD PhD FACP [facility]  Available via Epic secure chat 7am-7pm for nonurgent issues Please page for urgent issues To page the attending provider between 7A-7P or the covering provider during after hours 7P-7A, please log into the web site [email or web address] and access using universal [place] password for that web site. If you do not have the password, please call the hospital operator.    10/08/2021, 10:43 PM

## 2021-10-09 ENCOUNTER — Inpatient Hospital Stay (HOSPITAL_COMMUNITY): Payer: Medicare Other

## 2021-10-09 HISTORY — PX: IR THORACENTESIS ASP PLEURAL SPACE W/IMG GUIDE: IMG5380

## 2021-10-09 LAB — LACTATE DEHYDROGENASE, PLEURAL OR PERITONEAL FLUID: LD, Fluid: 108 U/L — ABNORMAL HIGH (ref 3–23)

## 2021-10-09 LAB — BODY FLUID CELL COUNT WITH DIFFERENTIAL
Eos, Fluid: 0 %
Lymphs, Fluid: 30 %
Monocyte-Macrophage-Serous Fluid: 2 % — ABNORMAL LOW (ref 50–90)
Neutrophil Count, Fluid: 68 % — ABNORMAL HIGH (ref 0–25)
Total Nucleated Cell Count, Fluid: 528 cu mm (ref 0–1000)

## 2021-10-09 LAB — CBC WITH DIFFERENTIAL/PLATELET
Abs Immature Granulocytes: 0.04 10*3/uL (ref 0.00–0.07)
Basophils Absolute: 0 10*3/uL (ref 0.0–0.1)
Basophils Relative: 0 %
Eosinophils Absolute: 0.2 10*3/uL (ref 0.0–0.5)
Eosinophils Relative: 2 %
HCT: 24.3 % — ABNORMAL LOW (ref 36.0–46.0)
Hemoglobin: 7.9 g/dL — ABNORMAL LOW (ref 12.0–15.0)
Immature Granulocytes: 1 %
Lymphocytes Relative: 7 %
Lymphs Abs: 0.5 10*3/uL — ABNORMAL LOW (ref 0.7–4.0)
MCH: 31.5 pg (ref 26.0–34.0)
MCHC: 32.5 g/dL (ref 30.0–36.0)
MCV: 96.8 fL (ref 80.0–100.0)
Monocytes Absolute: 0.7 10*3/uL (ref 0.1–1.0)
Monocytes Relative: 10 %
Neutro Abs: 5.9 10*3/uL (ref 1.7–7.7)
Neutrophils Relative %: 80 %
Platelets: 225 10*3/uL (ref 150–400)
RBC: 2.51 MIL/uL — ABNORMAL LOW (ref 3.87–5.11)
RDW: 16.8 % — ABNORMAL HIGH (ref 11.5–15.5)
WBC: 7.3 10*3/uL (ref 4.0–10.5)
nRBC: 0 % (ref 0.0–0.2)

## 2021-10-09 LAB — RENAL FUNCTION PANEL
Albumin: 2 g/dL — ABNORMAL LOW (ref 3.5–5.0)
Anion gap: 8 (ref 5–15)
BUN: 34 mg/dL — ABNORMAL HIGH (ref 6–20)
CO2: 27 mmol/L (ref 22–32)
Calcium: 7.8 mg/dL — ABNORMAL LOW (ref 8.9–10.3)
Chloride: 100 mmol/L (ref 98–111)
Creatinine, Ser: 3.11 mg/dL — ABNORMAL HIGH (ref 0.44–1.00)
GFR, Estimated: 17 mL/min — ABNORMAL LOW (ref >60)
Glucose, Bld: 99 mg/dL (ref 70–99)
Phosphorus: 3.8 mg/dL (ref 2.5–4.6)
Potassium: 4.2 mmol/L (ref 3.5–5.1)
Sodium: 135 mmol/L (ref 135–145)

## 2021-10-09 LAB — PROTEIN, PLEURAL OR PERITONEAL FLUID: Total protein, fluid: <3 g/dL

## 2021-10-09 LAB — PROCALCITONIN: Procalcitonin: 1.2 ng/mL

## 2021-10-09 MED ORDER — SODIUM CHLORIDE 0.9 % IV SOLN
1.0000 g | INTRAVENOUS | Status: DC
  Administered 2021-10-09 – 2021-10-11 (×3): 1 g via INTRAVENOUS
  Filled 2021-10-09 (×3): qty 10

## 2021-10-09 MED ORDER — GUAIFENESIN ER 600 MG PO TB12
600.0000 mg | ORAL_TABLET | Freq: Two times a day (BID) | ORAL | Status: DC
  Administered 2021-10-09 – 2021-10-12 (×7): 600 mg via ORAL
  Filled 2021-10-09 (×7): qty 1

## 2021-10-09 MED ORDER — ASCORBIC ACID 500 MG PO TABS
500.0000 mg | ORAL_TABLET | Freq: Two times a day (BID) | ORAL | Status: DC
  Administered 2021-10-09 – 2021-10-12 (×6): 500 mg via ORAL
  Filled 2021-10-09 (×6): qty 1

## 2021-10-09 MED ORDER — LIDOCAINE HCL 1 % IJ SOLN
INTRAMUSCULAR | Status: AC
  Filled 2021-10-09: qty 20

## 2021-10-09 MED ORDER — IPRATROPIUM-ALBUTEROL 0.5-2.5 (3) MG/3ML IN SOLN
3.0000 mL | Freq: Four times a day (QID) | RESPIRATORY_TRACT | Status: DC
  Administered 2021-10-09 – 2021-10-10 (×4): 3 mL via RESPIRATORY_TRACT
  Filled 2021-10-09 (×4): qty 3

## 2021-10-09 NOTE — TOC Progression Note (Addendum)
Transition of Care [facility]) - Progression Note    Patient Details  Name: [name] MRN: [id number] Date of Birth: 1960-11-16  Transition of Care [facility]) CM/SW Contact  Graves-Bigelow, Ocie Cornfield, RN Phone Number: 10/09/2021, 4:26 PM  Clinical Narrative:  Case Manager received consult for BIPAP. Case Manager submitted the referral to [employer] for durable medical equipment (DME). Case Manager will continue to follow for additional transition of care needs.   Expected Discharge Plan: [place] Barriers to Discharge: Continued Medical Work up  Expected Discharge Plan and Services Expected Discharge Plan: [place] In-house Referral: NA Discharge Planning Services: CM Consult Post Acute Care Choice: Resumption of Svcs/PTA Provider, Home Health Living arrangements for the past 2 months: Single Family Home                          HH Arranged: PT, OT [facility] Agency: [place] Date [facility] Agency Contacted: 10/04/21 Time [place]: 1620 Representative spoke with at [place]: [place]   Readmission Risk Interventions Readmission Risk Prevention Plan 10/04/2021  Transportation Screening Complete  Medication Review Press photographer) Complete  PCP or Specialist appointment within 3-5 days of discharge Complete  HRI or [place] Complete  SW Recovery Care/Counseling Consult Complete  [place] Not Applicable  Some recent data might be hidden

## 2021-10-09 NOTE — Progress Notes (Signed)
[facility] Progress Note  Name: [name] MRN: [id number] DOB: [birth year]  Chief Complaint:  Shortness of breath   Subjective: had HD this am w/    -------------- Background  [name] is a/an 61 y.o. female with a past medical history of ESRD on HD, hypertension, hyperlipidemia, COPD, h/o AS, chronic hypoxic respiratory failure on chronic 2 L nasal cannula who presents with shortness of breath.  She was recently admitted here from 12/30 to 10/01/2021 for dyspnea likely related to volume overload and possible COPD exacerbation.  She was discharged on 1/1, she did go to her HD unit yesterday on 1/2.  The plan previously was to adjust her dry weight further, however it seems that her dry weight was the same as it was before at yesterday's HD session. Chest x-ray here was consistent with pulmonary edema.  She was also found to be in hypertensive urgency with systolic blood pressures up in the 180s.  She was placed on BiPAP and nitroglycerin drip.  She currently feels slightly better in regards to her breathing. Plan is to do HD tonight. Denies any fevers, chills, chest pain, swelling.  Intake/Output Summary (Last 24 hours) at 10/09/2021 1353 Last data filed at 10/09/2021 0800 Gross per 24 hour  Intake 600.16 ml  Output --  Net 600.16 ml     Vitals:  Vitals:   10/09/21 1030 10/09/21 1100 10/09/21 1130 10/09/21 1200  BP: (!) 195/68 (!) 163/59 (!) 169/75 (!) 121/51  Pulse: 90 97 (!) 101 100  Resp: 17 19 (!) 24 18  Temp:      TempSrc:      SpO2: 100% 100% 99% 97%  Weight:      Height:         Physical Exam:  General adult female in bed in NAD; chronically ill appearing HEENT normocephalic atraumatic extraocular movements intact sclera anicteric Neck supple trachea midline Lungs decreased breath sounds; occ wheeze; unlabored at rest; sats 100% on 4 liters Heart S1S2 no rub Abdomen soft nontender nondistended Extremities no edema lower extremities Psych  normal mood and affect Neuro - alert and oriented x 3 conversant  Access RIJ tunn catheter and LUE AVG bruit and thrill     OP HD: MWF GO   3h 56min  40.8kg  3K/2.5 bath  Hep none  TDC/ LUA AVG done 09/14/21  - calcitriol 0.5 mcg po tiw  - mircera 75 mcg on 09/15/21   Assessment/Plan:  AHRF/ pulm edema / severe COPD - UF as tol, lowest wt here 35.8kg Hypertensive urgency - with flash pulm edema. Increased isosorbide mononitrate to 60 mg qd and started losartan 25 mg qd and titrating to 50 mg daily COPD - per pulmonology. Unsure of her baseline respiratory status but suspect she has limited reserve ESRD - HD MWF. S/p CRRT 1/4 - 1/6 and then iHD on 1/7. HD today. Anemia of Chronic Kidney Disease: note Hb drop.  S/p PRBC's on 10/04/21.  Aranesp 100 mcg weekly on thursdays MBD ckd - back on phoslo 1 tablet with meals. On calcitriol     Sol Blazing, MD 10/09/2021 1:53 PM

## 2021-10-09 NOTE — Procedures (Signed)
Ultrasound-guided diagnostic and therapeutic right thoracentesis performed yielding 280 milliliters of straw colored fluid. No immediate complications.   Diagnostic fluid was sent to the lab for further analysis. Follow-up chest x-ray pending. EBL is none.

## 2021-10-09 NOTE — Progress Notes (Signed)
PROGRESS NOTE    [name]  TDD:[id number] DOB: July 10, 1961 DOA: 10/03/2021 PCP: Bernerd Limbo, MD   Brief Narrative: 61 year old with past medical history significant for ESRD on hemodialysis MWF, hypertension, hyperlipidemia, COPD, chronic hypoxic respiratory failure 2 L of oxygen who presents on 1/4 with worsening shortness of breath.  Patient was found to have hypertension urgency with flash pulmonary edema. Patient was a started on nitroglycerin drip, BiPAP admitted to ICU, she received urgent dialysis.  Her condition improved she was transferred to hospitalist service 1/7.  Of note patient had a recent hospitalization from 09/29/2021 through 10/01/2021 for dyspnea related to volume overload andpossibleCOPDexacerbation.  She has not missed any hemodialysis.    Assessment & Plan:   Principal Problem:   Hypertensive urgency  1-Acute on chronic hypoxic respiratory failure/hypertensive emergency, acute flash pulmonary edema -Component also of COPD exacerbation. -Patient received urgent hemodialysis and required BiPAP on admission. -Back on chronic 2 L of oxygen -Continue to have shortness of breath, cough.  X-ray showed worsening right pleural effusion.   -Plan for diagnostic and therapeutic thoracentesis.  We will start IV ceftriaxone  2-Hypertension Emergency required nitroglycerin drip. Continue Norvasc, Imdur and Cozaar  ESRD on hemodialysis MWF TDC/LUE UA AV graft on 09/14/2021 Appreciate nephrology assistance, currently getting hemodialysis  Acute on chronic anemia: Combination anemia of chronic disease and question slow GI loss Patient had a recent endoscopy colonoscopy and capsule endoscopy showed normal esophagus stomach small bowel.  Colon: Erosion versus polyps nonbleeding Continue with PPI Received 1 unit of packed red blood cells 1/4 Hb 7.9 monitor.   History of bipolar disorder, anxiety, chronic pain Continue Effexor trazodone Percocet  Falls: She will  need home health PT   Severe protein malnutrition in setting of chronic illness, boost breeze ordered   Nutrition Problem: Severe Malnutrition Etiology: chronic illness (ESRD/HD, COPD on  home oxygen, CHF)    Signs/Symptoms: severe fat depletion, severe muscle depletion    Interventions: Boost Breeze, MVI, Refer to RD note for recommendations  Estimated body mass index is 15.83 kg/m as calculated from the following:   Height as of this encounter: 5\' 1"  (1.549 m).   Weight as of this encounter: 38 kg.   DVT prophylaxis: SCD Code Status: DNR Family Communication: care discussed with patient.  Disposition Plan:  Status is: Inpatient  Remains inpatient appropriate because: Management pleural effusion, HTN emergency         Consultants:  Nephrology   Procedures:  HD  Antimicrobials:  Ceftriaxone   Subjective: Continues to complain of Dr. Cough, shortness of breath  Objective: Vitals:   10/09/21 0400 10/09/21 0500 10/09/21 0600 10/09/21 0700  BP: (!) 148/68 (!) 145/79 (!) 159/67 (!) 192/95  Pulse: 88 (!) 107 90 98  Resp: 13 20 14  (!) 29  Temp: 98.6 F (37 C)     TempSrc: Oral     SpO2: 99% 95% 99% 94%  Weight:  38 kg    Height:        Intake/Output Summary (Last 24 hours) at 10/09/2021 0742 Last data filed at 10/09/2021 0500 Gross per 24 hour  Intake 720.16 ml  Output --  Net 720.16 ml   Filed Weights   10/07/21 1900 10/08/21 0500 10/09/21 0500  Weight: 37.7 kg 36.9 kg 38 kg    Examination:  General exam: Appears calm and comfortable  Respiratory system: BL ronchus, crackles.  Cardiovascular system: S1 & S2 heard, RRR. No JVD, murmurs, rubs, gallops or clicks. No pedal edema. Gastrointestinal  system: Abdomen is nondistended, soft and nontender. No organomegaly or masses felt. Normal bowel sounds heard. Central nervous system: Alert and oriented.  Extremities: Symmetric 5 x 5 power.   Data Reviewed: I have personally reviewed following labs and  imaging studies  CBC: Recent Labs  Lab 10/03/21 2023 10/03/21 2029 10/04/21 0126 10/04/21 1833 10/05/21 0542 10/06/21 0116 10/09/21 0105  WBC 14.1*  --  7.6 11.2* 11.4* 10.7* 7.3  NEUTROABS 13.5*  --   --   --   --   --  5.9  HGB 8.4*   < > 7.0* 9.6* 9.7* 9.8* 7.9*  HCT 26.7*   < > 22.3* 29.2* 29.4* 30.5* 24.3*  MCV 97.8  --  97.4 95.1 97.0 97.8 96.8  PLT 291  --  229 246 214 193 225   < > = values in this interval not displayed.   Basic Metabolic Panel: Recent Labs  Lab 10/04/21 0126 10/04/21 1603 10/05/21 0542 10/05/21 1636 10/06/21 0116 10/07/21 0048 10/08/21 0604 10/09/21 0105  NA 132*   < > 135 135 136 127* 132* 135  K 4.7   < > 4.3 4.2 4.6 4.8 3.6 4.2  CL 95*   < > 102 101 100 94* 98 100  CO2 25   < > 22 27 27 23 28 27   GLUCOSE 208*   < > 81 107* 94 75 112* 99  BUN 46*   < > 21* 13 15 39* 18 34*  CREATININE 2.74*   < > 1.34* 1.05* 1.16* 2.57* 1.84* 3.11*  CALCIUM 8.6*   < > 8.3* 8.3* 8.5* 8.1* 7.8* 7.8*  MG 1.8  --  2.4  --  2.3 1.9  --   --   PHOS 3.9   < > 2.6 2.2* 3.6 4.4 3.1 3.8   < > = values in this interval not displayed.   GFR: Estimated Creatinine Clearance: 11.8 mL/min (A) (by C-G formula based on SCr of 3.11 mg/dL (H)). Liver Function Tests: Recent Labs  Lab 10/03/21 2023 10/04/21 1603 10/05/21 1636 10/06/21 0116 10/07/21 0048 10/08/21 0604 10/09/21 0105  AST 41  --   --   --   --   --   --   ALT 55*  --   --   --   --   --   --   ALKPHOS 158*  --   --   --   --   --   --   BILITOT 1.2  --   --   --   --   --   --   PROT 6.4*  --   --   --   --   --   --   ALBUMIN 3.5   < > 3.1* 3.0* 2.5* 2.1* 2.0*   < > = values in this interval not displayed.   No results for input(s): LIPASE, AMYLASE in the last 168 hours. No results for input(s): AMMONIA in the last 168 hours. Coagulation Profile: No results for input(s): INR, PROTIME in the last 168 hours. Cardiac Enzymes: No results for input(s): CKTOTAL, CKMB, CKMBINDEX, TROPONINI in the last  168 hours. BNP (last 3 results) No results for input(s): PROBNP in the last 8760 hours. HbA1C: No results for input(s): HGBA1C in the last 72 hours. CBG: Recent Labs  Lab 10/04/21 0001 10/04/21 1942  GLUCAP 114* 99   Lipid Profile: Recent Labs    10/08/21 0604  TRIG 50   Thyroid Function Tests: No results  for input(s): TSH, T4TOTAL, FREET4, T3FREE, THYROIDAB in the last 72 hours. Anemia Panel: No results for input(s): VITAMINB12, FOLATE, FERRITIN, TIBC, IRON, RETICCTPCT in the last 72 hours. Sepsis Labs: Recent Labs  Lab 10/09/21 0105  PROCALCITON 1.20    Recent Results (from the past 240 hour(s))  MRSA Next Gen by PCR, Nasal     Status: None   Collection Time: 10/04/21  2:49 AM   Specimen: Nasal Mucosa; Nasal Swab  Result Value Ref Range Status   MRSA by PCR Next Gen NOT DETECTED NOT DETECTED Final    Comment: (NOTE) The GeneXpert MRSA Assay (FDA approved for NASAL specimens only), is one component of a comprehensive MRSA colonization surveillance program. It is not intended to diagnose MRSA infection nor to guide or monitor treatment for MRSA infections. Test performance is not FDA approved in patients less than 69 years old. Performed at [facility], [place] [address] [place], [place] [address]          Radiology Studies: DG Chest 2 View  Result Date: 10/08/2021 CLINICAL DATA:  Respiratory distress.  Cough. EXAM: CHEST - 2 VIEW COMPARISON:  10/04/2021, CT 10/01/2021 FINDINGS: Right-sided dialysis catheter remains in place. Grossly unchanged cardiomegaly. Worsening right but slight improvement in left pleural effusion, as well as associated bibasilar opacities. Diffuse interstitial opacities suspicious for pulmonary edema, mildly improved. No pneumothorax. IMPRESSION: 1. Worsening right but slight improvement in left pleural effusion, as well as associated basilar opacities. 2. Stable cardiomegaly. Mildly improved interstitial opacities likely improving  edema. Electronically Signed   By: Keith Rake M.D.   On: 10/08/2021 17:37        Scheduled Meds:  amLODipine  10 mg Oral Daily   arformoterol  15 mcg Nebulization BID   atorvastatin  80 mg Oral QHS   budesonide (PULMICORT) nebulizer solution  0.25 mg Nebulization BID   calcitRIOL  0.5 mcg Oral Q M,W,F-HD   calcium acetate  667 mg Oral TID WC   Chlorhexidine Gluconate Cloth  6 each Topical Q0600   Chlorhexidine Gluconate Cloth  6 each Topical Q0600   [START ON 10/12/2021] darbepoetin (ARANESP) injection - DIALYSIS  100 mcg Subcutaneous Q Thu-HD   ezetimibe  10 mg Oral Daily   feeding supplement  237 mL Oral TID BM   folic acid  1 mg Oral Daily   isosorbide mononitrate  60 mg Oral Daily   linaclotide  72 mcg Oral QAC breakfast   loratadine  10 mg Oral Daily   losartan  50 mg Oral Daily   megestrol  40 mg Oral Daily   multivitamin  1 tablet Oral QHS   pantoprazole  40 mg Oral Daily   venlafaxine XR  300 mg Oral Q breakfast   vitamin B-12  500 mcg Oral Daily   Continuous Infusions:  sodium chloride Stopped (10/06/21 1929)     LOS: 6 days    Time spent: 35 minutes.     Elmarie Shiley, MD [facility]   If 7PM-7AM, please contact night-coverage [email or web address]  10/09/2021, 7:42 AM

## 2021-10-10 LAB — HEPATIC FUNCTION PANEL
ALT: 35 U/L (ref 0–44)
AST: 27 U/L (ref 15–41)
Albumin: 2.3 g/dL — ABNORMAL LOW (ref 3.5–5.0)
Alkaline Phosphatase: 97 U/L (ref 38–126)
Bilirubin, Direct: <0.1 mg/dL (ref 0.0–0.2)
Total Bilirubin: 0.7 mg/dL (ref 0.3–1.2)
Total Protein: 5.8 g/dL — ABNORMAL LOW (ref 6.5–8.1)

## 2021-10-10 LAB — RENAL FUNCTION PANEL
Albumin: 2.3 g/dL — ABNORMAL LOW (ref 3.5–5.0)
Anion gap: 13 (ref 5–15)
BUN: 20 mg/dL (ref 6–20)
CO2: 25 mmol/L (ref 22–32)
Calcium: 8.5 mg/dL — ABNORMAL LOW (ref 8.9–10.3)
Chloride: 96 mmol/L — ABNORMAL LOW (ref 98–111)
Creatinine, Ser: 2.11 mg/dL — ABNORMAL HIGH (ref 0.44–1.00)
GFR, Estimated: 27 mL/min — ABNORMAL LOW (ref >60)
Glucose, Bld: 134 mg/dL — ABNORMAL HIGH (ref 70–99)
Phosphorus: 3 mg/dL (ref 2.5–4.6)
Potassium: 3.6 mmol/L (ref 3.5–5.1)
Sodium: 134 mmol/L — ABNORMAL LOW (ref 135–145)

## 2021-10-10 LAB — BLOOD GAS, ARTERIAL
Acid-Base Excess: 0.7 mmol/L (ref 0.0–2.0)
Bicarbonate: 24.8 mmol/L (ref 20.0–28.0)
Drawn by: 519031
FIO2: 28
O2 Saturation: 91.6 %
Patient temperature: 37
pCO2 arterial: 40.4 mmHg (ref 32.0–48.0)
pH, Arterial: 7.406 (ref 7.350–7.450)
pO2, Arterial: 62.7 mmHg — ABNORMAL LOW (ref 83.0–108.0)

## 2021-10-10 LAB — GRAM STAIN

## 2021-10-10 LAB — CYTOLOGY - NON PAP

## 2021-10-10 LAB — LACTATE DEHYDROGENASE: LDH: 199 U/L — ABNORMAL HIGH (ref 98–192)

## 2021-10-10 MED ORDER — IPRATROPIUM-ALBUTEROL 0.5-2.5 (3) MG/3ML IN SOLN
3.0000 mL | Freq: Two times a day (BID) | RESPIRATORY_TRACT | Status: DC
  Administered 2021-10-10 – 2021-10-12 (×3): 3 mL via RESPIRATORY_TRACT
  Filled 2021-10-10 (×3): qty 3

## 2021-10-10 NOTE — Progress Notes (Signed)
RT NOTES: ABG obtained and sent to lab. Lab tech notified.  

## 2021-10-10 NOTE — Progress Notes (Signed)
Physical Therapy Treatment Patient Details Name: [name] MRN: [id number] DOB: 05-17-1961 Today's Date: 10/10/2021   History of Present Illness Pt is a 61 y.o. female admitted 10/03/21 with dyspnea. Workup for HTN urgency, flash pulmonary edema; pt required ICU admission for nitro drip. PMH includes ESRD (HD MWF), HTN, HLD, COPD (wears 2L O2 baseline), bipolar disorder, chronic pain. Of note, recent admission 09/29/21-10/01/21 for volume overload, COPD exacerbation.    PT Comments    Pt longsitting in bed on entry, with lunch on tray table. PT encourages pt to get up to recliner for lunch. Pt reports she is not eating her lunch today so lunch tray removed. Pt requires increased encouragement but ultimately gets up and quickly walks around foot of bed to sit in recliner. Pt refuses any further ambulation as she is tired. Once sitting in recliner pt asks where her pot roast went. PT replaces lunch tray and pt happily starts eating. D/c plans remain appropriate at this time. PT will continue to follow acutely.     Recommendations for follow up therapy are one component of a multi-disciplinary discharge planning process, led by the attending physician.  Recommendations may be updated based on patient status, additional functional criteria and insurance authorization.  Follow Up Recommendations  Home health PT     Assistance Recommended at Discharge Frequent or constant Supervision/Assistance  Patient can return home with the following A little help with walking and/or transfers;A little help with bathing/dressing/bathroom;Assistance with cooking/housework;Assist for transportation;Help with stairs or ramp for entrance;Direct supervision/assist for medications management;Direct supervision/assist for financial management   Equipment Recommendations  Wheelchair (measurements PT);Wheelchair cushion (measurements PT)       Precautions / Restrictions Precautions Precautions: Fall;Other  (comment) Precaution Comments: Watch HR, wears 2L O2 baseline Restrictions Weight Bearing Restrictions: No     Mobility  Bed Mobility Overal bed mobility: Independent             General bed mobility comments: longsitting in bed on entry, moves to EoB with no assistance    Transfers Overall transfer level: Needs assistance Equipment used: None Transfers: Sit to/from Stand Sit to Stand: Min guard           General transfer comment: min guard for safety, refuses equipment "I don't need that thing"    Ambulation/Gait Ambulation/Gait assistance: Min guard Gait Distance (Feet): 12 Feet Assistive device: None Gait Pattern/deviations: Decreased stride length;Step-through pattern;Wide base of support;Trunk flexed Gait velocity: decreased Gait velocity interpretation: <1.31 ft/sec, indicative of household ambulator   General Gait Details: min guard to walk around foot of bed to sit up in recliner, refuses any further ambulation, requires assist to manage O2 line          Balance Overall balance assessment: Needs assistance;History of Falls Sitting-balance support: Feet supported;No upper extremity supported Sitting balance-Leahy Scale: Good Sitting balance - Comments: can long sit and reach bilateral feet without assist; not tested at EOB   Standing balance support: During functional activity;No upper extremity supported Standing balance-Leahy Scale: Fair Standing balance comment: dynamic balance improved with single UE support                            Cognition Arousal/Alertness: Awake/alert Behavior During Therapy: WFL for tasks assessed/performed;Impulsive Overall Cognitive Status: No family/caregiver present to determine baseline cognitive functioning Area of Impairment: Problem solving;Safety/judgement;Following commands  Memory: Decreased short-term memory Following Commands: Follows one step commands with increased  time Safety/Judgement: Decreased awareness of safety;Decreased awareness of deficits Awareness: Intellectual Problem Solving: Slow processing;Requires verbal cues General Comments: on entry pt reports she is not eating her lunch, so PT removed from tray table, at end of session pt inquires where her lunch went she was hungry, a little impulsive with her movements when she decides to get up           General Comments General comments (skin integrity, edema, etc.): SpO2 on 2L O2 via [place] >94%O2, HR in 100s with mobility      Pertinent Vitals/Pain Pain Assessment: No/denies pain    Home Living Family/patient expects to be discharged to:: Private residence Living Arrangements: Other relatives Available Help at Discharge: Family;Available PRN/intermittently Type of Home: House Home Access: Stairs to enter Entrance Stairs-Rails: Right Entrance Stairs-Number of Steps: 5-6   Home Layout: One level Home Equipment: Cane - single point;Air cabin crew (4 wheels) Additional Comments: Lives with grandson and his family for increased assist        PT Goals (current goals can now be found in the care plan section) Acute Rehab PT Goals Patient Stated Goal: go home PT Goal Formulation: With patient Time For Goal Achievement: 10/22/21 Potential to Achieve Goals: Good Progress towards PT goals: Progressing toward goals    Frequency    Min 3X/week      PT Plan Current plan remains appropriate       AM-PAC PT "6 Clicks" Mobility   Outcome Measure  Help needed turning from your back to your side while in a flat bed without using bedrails?: None Help needed moving from lying on your back to sitting on the side of a flat bed without using bedrails?: None Help needed moving to and from a bed to a chair (including a wheelchair)?: A Little Help needed standing up from a chair using your arms (e.g., wheelchair or bedside chair)?: A Little Help needed to walk in hospital room?: A  Lot Help needed climbing 3-5 steps with a railing? : A Lot 6 Click Score: 18    End of Session Equipment Utilized During Treatment: Oxygen Activity Tolerance: Patient limited by fatigue Patient left: with call bell/phone within reach;in chair Nurse Communication: Mobility status PT Visit Diagnosis: Other abnormalities of gait and mobility (R26.89);Muscle weakness (generalized) (M62.81)     Time: 4098-1191 PT Time Calculation (min) (ACUTE ONLY): 17 min  Charges:  $Therapeutic Activity: 8-22 mins                     Jahmire Ruffins B. Migdalia Dk PT, DPT Acute Rehabilitation Services Pager [phone] Office [phone]    [place] 10/10/2021, 12:01 PM

## 2021-10-10 NOTE — Progress Notes (Signed)
Pt receives out-pt HD at [facility] on MWF. Pt arrives at 11:30 for 11:50 chair time. Will assist as needed.  Melven Sartorius Renal Navigator [phone]

## 2021-10-10 NOTE — Progress Notes (Signed)
Nutrition Follow-up  DOCUMENTATION CODES:   Severe malnutrition in context of chronic illness  INTERVENTION:   Continue Boost Breeze po TID, each supplement provides 250 kcal and 9 grams of protein.  Continue Renal MVI daily.  Vitamin C 500 mg PO BID for low vitamin C level of 0.2.  NUTRITION DIAGNOSIS:   Severe Malnutrition related to chronic illness (ESRD/HD, COPD on  home oxygen, CHF) as evidenced by severe fat depletion, severe muscle depletion.  Ongoing   GOAL:   Patient will meet greater than or equal to 90% of their needs  Progressing   MONITOR:   PO intake, Supplement acceptance, Weight trends, Labs  REASON FOR ASSESSMENT:   Rounds (CRRT)    ASSESSMENT:   61 yo female admitted with acute respiratory failure secondary to acute pulmonary edema with underlying COPD. hypertensive urgency requiring Cleviprex. PMH includes ESRD/HD, HTN, HLD, HLD, COPD, chronic respiratory failure on 2L [place], anxiety, bipolar disorder chronic pain, tobacco abuse, IBS, esophageal stricture, hiatal hernia  S/P iHD 1/9 with 2 L UF. Patient remains on a dysphagia 3 diet with thin liquids.  Meal intakes: 25-100%  Patient reports that she has been eating well. She thinks she ordered 2 trays for lunch today. Upon further investigation, she ordered coffee in addition to her meal tray that she had already ordered. She said she loves the [employer] and has been drinking 5-6 per day. Per RN, she seems confused at times.   Labs reviewed. Na 134  Medications reviewed and include vitamin C 500 mg PO BID, calcitriol, Phoslo, Aranesp, folic acid, Megace, Rena-vit, Protonix, vitamin B-12 500 mcg PO daily.  Weight is trending back down.  Admission weight 40.8 kg Currently 35.9 kg   Diet Order:   Diet Order             DIET DYS 3 Room service appropriate? Yes; Fluid consistency: Thin; Fluid restriction: 1200 mL Fluid  Diet effective now                   EDUCATION NEEDS:   Not  appropriate for education at this time  Skin:  Skin Assessment: Reviewed RN Assessment  Last BM:  1/9 type 4  Height:   Ht Readings from Last 1 Encounters:  10/04/21 5\' 1"  (1.549 m)    Weight:   Wt Readings from Last 1 Encounters:  10/10/21 35.9 kg    BMI:  Body mass index is 14.96 kg/m.  Estimated Nutritional Needs:   Kcal:  1500-1700 kcals  Protein:  75-95 g  Fluid:  1000 mL plus UOP    Lucas Mallow RD, LDN, CNSC Please refer to Amion for contact information.

## 2021-10-10 NOTE — Progress Notes (Signed)
Patient started on overnight pulse oximetry for the night with oxygen set at 2lpm.

## 2021-10-10 NOTE — Progress Notes (Signed)
[facility] Progress Note   Subjective: Seen in room up in chair. Denies SOB. AFib on monitor rate low 100s. HD tomorrow on schedule  Objective Vitals:   10/10/21 0452 10/10/21 0826 10/10/21 1000 10/10/21 1022  BP: (!) 165/76   (!) 162/70  Pulse: 96  98 (!) 104  Resp: 16     Temp: 99.2 F (37.3 C)  98.9 F (37.2 C)   TempSrc: Oral  Oral   SpO2: 94% 95% 96% 98%  Weight: 35.9 kg     Height:       Physical Exam General: Chronically ill appearing female, looks older than stated age.  Heart: S1,S2 irreg. No M/R/G Lungs: Decreased in bases otherwise CTAB. No WOB.  Abdomen: NABS, NT, ND Extremities: No LE edema.  Dialysis Access: RIJ TDC drsg intact. L AVG + T/B   Additional Objective Labs: Basic Metabolic Panel: Recent Labs  Lab 10/08/21 0604 10/09/21 0105 10/10/21 0103  NA 132* 135 134*  K 3.6 4.2 3.6  CL 98 100 96*  CO2 28 27 25   GLUCOSE 112* 99 134*  BUN 18 34* 20  CREATININE 1.84* 3.11* 2.11*  CALCIUM 7.8* 7.8* 8.5*  PHOS 3.1 3.8 3.0   Liver Function Tests: Recent Labs  Lab 10/03/21 2023 10/04/21 1603 10/09/21 0105 10/10/21 0103 10/10/21 1031  AST 41  --   --   --  27  ALT 55*  --   --   --  35  ALKPHOS 158*  --   --   --  97  BILITOT 1.2  --   --   --  0.7  PROT 6.4*  --   --   --  5.8*  ALBUMIN 3.5   < > 2.0* 2.3* 2.3*   < > = values in this interval not displayed.   No results for input(s): LIPASE, AMYLASE in the last 168 hours. CBC: Recent Labs  Lab 10/03/21 2023 10/03/21 2029 10/04/21 0126 10/04/21 1833 10/05/21 0542 10/06/21 0116 10/09/21 0105  WBC 14.1*  --  7.6 11.2* 11.4* 10.7* 7.3  NEUTROABS 13.5*  --   --   --   --   --  5.9  HGB 8.4*   < > 7.0* 9.6* 9.7* 9.8* 7.9*  HCT 26.7*   < > 22.3* 29.2* 29.4* 30.5* 24.3*  MCV 97.8  --  97.4 95.1 97.0 97.8 96.8  PLT 291  --  229 246 214 193 225   < > = values in this interval not displayed.   Blood Culture    Component Value Date/Time   SDES FLUID PLEURAL 10/09/2021  1516   SDES FLUID PLEURAL 10/09/2021 1516   SPECREQUEST BOTTLES DRAWN AEROBIC AND ANAEROBIC 10/09/2021 1516   SPECREQUEST NONE 10/09/2021 1516   CULT  10/09/2021 1516    NO GROWTH < 24 HOURS Performed at [facility], [place] [address] [place], [place] [address]    REPTSTATUS PENDING 10/09/2021 1516   REPTSTATUS 10/10/2021 FINAL 10/09/2021 1516    Cardiac Enzymes: No results for input(s): CKTOTAL, CKMB, CKMBINDEX, TROPONINI in the last 168 hours. CBG: Recent Labs  Lab 10/04/21 0001 10/04/21 1942  GLUCAP 114* 99   Iron Studies: No results for input(s): IRON, TIBC, TRANSFERRIN, FERRITIN in the last 72 hours. @lablastinr3 @ Studies/Results: DG Chest 1 View  Result Date: 10/09/2021 CLINICAL DATA:  Status post right thoracentesis. EXAM: CHEST  1 VIEW COMPARISON:  October 08, 2021. FINDINGS: No pneumothorax status post right thoracentesis. Minimal right pleural effusion  is noted. IMPRESSION: No pneumothorax status post right thoracentesis. Electronically Signed   By: Marijo Conception M.D.   On: 10/09/2021 15:05   DG Chest 2 View  Result Date: 10/08/2021 CLINICAL DATA:  Respiratory distress.  Cough. EXAM: CHEST - 2 VIEW COMPARISON:  10/04/2021, CT 10/01/2021 FINDINGS: Right-sided dialysis catheter remains in place. Grossly unchanged cardiomegaly. Worsening right but slight improvement in left pleural effusion, as well as associated bibasilar opacities. Diffuse interstitial opacities suspicious for pulmonary edema, mildly improved. No pneumothorax. IMPRESSION: 1. Worsening right but slight improvement in left pleural effusion, as well as associated basilar opacities. 2. Stable cardiomegaly. Mildly improved interstitial opacities likely improving edema. Electronically Signed   By: Keith Rake M.D.   On: 10/08/2021 17:37   IR THORACENTESIS ASP PLEURAL SPACE W/IMG GUIDE  Result Date: 10/09/2021 INDICATION: Patient history of end-stage renal disease on hemodialysis, COPD. Admitted for  respiratory failure found to have flash pulmonary edema with small right-sided pleural effusion. Request is for therapeutic and diagnostic thoracentesis. EXAM: ULTRASOUND GUIDED DIAGNOSTIC AND THERAPEUTIC RIGHT-SIDED THORACENTESIS MEDICATIONS: Lidocaine 1% 10 mL COMPLICATIONS: None immediate. PROCEDURE: An ultrasound guided thoracentesis was thoroughly discussed with the patient and questions answered. The benefits, risks, alternatives and complications were also discussed. The patient understands and wishes to proceed with the procedure. Written consent was obtained. Ultrasound was performed to localize and mark an adequate pocket of fluid in the right chest. The area was then prepped and draped in the normal sterile fashion. 1% Lidocaine was used for local anesthesia. Under ultrasound guidance a 19 gauge, 7-cm, Yueh catheter was introduced. Thoracentesis was performed. The catheter was removed and a dressing applied. FINDINGS: A total of approximately 280 mL of straw-colored fluid was removed. Samples were sent to the laboratory as requested by the clinical team. IMPRESSION: Successful ultrasound guided diagnostic and therapeutic right-sided thoracentesis yielding 280 mL of pleural fluid. Read by: Rushie Nyhan, NP Electronically Signed   By: Sandi Mariscal M.D.   On: 10/09/2021 15:18   Medications:  sodium chloride Stopped (10/06/21 1929)   cefTRIAXone (ROCEPHIN)  IV Stopped (10/09/21 1629)    amLODipine  10 mg Oral Daily   arformoterol  15 mcg Nebulization BID   vitamin C  500 mg Oral BID   atorvastatin  80 mg Oral QHS   budesonide (PULMICORT) nebulizer solution  0.25 mg Nebulization BID   calcitRIOL  0.5 mcg Oral Q M,W,F-HD   calcium acetate  667 mg Oral TID WC   Chlorhexidine Gluconate Cloth  6 each Topical Q0600   [START ON 10/12/2021] darbepoetin (ARANESP) injection - DIALYSIS  100 mcg Subcutaneous Q Thu-HD   ezetimibe  10 mg Oral Daily   feeding supplement  237 mL Oral TID BM   folic acid   1 mg Oral Daily   guaiFENesin  600 mg Oral BID   ipratropium-albuterol  3 mL Nebulization Q6H   isosorbide mononitrate  60 mg Oral Daily   linaclotide  72 mcg Oral QAC breakfast   loratadine  10 mg Oral Daily   losartan  50 mg Oral Daily   megestrol  40 mg Oral Daily   multivitamin  1 tablet Oral QHS   pantoprazole  40 mg Oral Daily   venlafaxine XR  300 mg Oral Q breakfast   vitamin B-12  500 mcg Oral Daily     OP HD: MWF GO   3h 15min  40.8kg  3K/2.5 bath  Hep none  TDC/ LUA AVG done 09/14/21  - calcitriol  0.5 mcg po tiw  - mircera 75 mcg on 09/15/21     Assessment/Plan:  AHRF/ pulm edema / severe COPD - UF as tol, lowest wt here 35.9 kg Hypertensive urgency - with flash pulm edema. Increased isosorbide mononitrate to 60 mg qd and started losartan 25 mg qd and titrating to 50 mg daily.  COPD - per pulmonology. Unsure of her baseline respiratory status but suspect she has limited reserve ESRD - HD MWF. S/p CRRT 1/4 - 1/6 and then iHD on 1/7. Next HD 10/11/2021.  Anemia of Chronic Kidney Disease: note Hb drop.  S/p PRBC's on 10/04/21.  Aranesp 100 mcg weekly on thursdays MBD ckd - back on phoslo 1 tablet with meals. On calcitriol     Julann Mcgilvray H. Walta [place] NP-C 10/10/2021, 12:28 PM  [employer] [phone]

## 2021-10-10 NOTE — Progress Notes (Signed)
RT went back to pt room to give pt 0200 breathing treatment and to see if pt was ready for bipap, pt refused. RN made aware.

## 2021-10-10 NOTE — Progress Notes (Signed)
PROGRESS NOTE    [name]  VVO:[id number] DOB: 01-20-1961 DOA: 10/03/2021 PCP: Bernerd Limbo, MD   Brief Narrative: 61 year old with past medical history significant for ESRD on hemodialysis MWF, hypertension, hyperlipidemia, COPD, chronic hypoxic respiratory failure 2 L of oxygen who presents on 1/4 with worsening shortness of breath.  Patient was found to have hypertension urgency with flash pulmonary edema. Patient was a started on nitroglycerin drip, BiPAP admitted to ICU, she received urgent dialysis.  Her condition improved she was transferred to hospitalist service 1/7.  Of note patient had a recent hospitalization from 09/29/2021 through 10/01/2021 for dyspnea related to volume overload andpossibleCOPDexacerbation.  She has not missed any hemodialysis.    Assessment & Plan:   Principal Problem:   Hypertensive urgency  1-Acute on chronic hypoxic respiratory failure/hypertensive emergency, acute flash pulmonary edema -Component also of COPD exacerbation. -Patient received urgent hemodialysis and required BiPAP on admission. -Back on chronic 2 L of oxygen -Continue to have shortness of breath, cough.  X-ray showed worsening right pleural effusion.   -Underwent  diagnostic and therapeutic thoracentesis 1/09 yielding only 280 ml -Started  IV ceftriaxone -pleural effusion probably transudate LDH ratio 0.5, protein ratio 0.5. Follow final fluid culture results.  -Cm is working on BIPAP for Home. Will get ABG, and nocturnal pulse oxymetry.   2-Hypertension Emergency required nitroglycerin drip. Continue Norvasc, Imdur and Cozaar  ESRD on hemodialysis MWF TDC/LUE UA AV graft on 09/14/2021 Appreciate nephrology assistance, currently getting hemodialysis  Acute on chronic anemia: Combination anemia of chronic disease and question slow GI loss Patient had a recent endoscopy colonoscopy and capsule endoscopy showed normal esophagus stomach small bowel.  Colon: Erosion versus  polyps nonbleeding Continue with PPI Received 1 unit of packed red blood cells 1/4 Hb yesterday 7.9 monitor.   History of bipolar disorder, anxiety, chronic pain Continue Effexor trazodone Percocet  Falls: She will need home health PT   Severe protein malnutrition in setting of chronic illness,  breeze ordered  Tachycardia; check EKG>   Nutrition Problem: Severe Malnutrition Etiology: chronic illness (ESRD/HD, COPD on  home oxygen, CHF)    Signs/Symptoms: severe fat depletion, severe muscle depletion    Interventions: Boost Breeze, MVI, Refer to RD note for recommendations  Estimated body mass index is 14.96 kg/m as calculated from the following:   Height as of this encounter: 5\' 1"  (1.549 m).   Weight as of this encounter: 35.9 kg.   DVT prophylaxis: SCD Code Status: DNR Family Communication: care discussed with patient.  Disposition Plan:  Status is: Inpatient  Remains inpatient appropriate because: Management pleural effusion, HTN emergency         Consultants:  Nephrology   Procedures:  HD  Antimicrobials:  Ceftriaxone   Subjective: She is still complaining of dyspnea. Didn't use BIPAP last night. Per nurse patient appears more confuse today.  Objective: Vitals:   10/10/21 0452 10/10/21 0826 10/10/21 1000 10/10/21 1022  BP: (!) 165/76   (!) 162/70  Pulse: 96  98 (!) 104  Resp: 16     Temp: 99.2 F (37.3 C)  98.9 F (37.2 C)   TempSrc: Oral  Oral   SpO2: 94% 95% 96% 98%  Weight: 35.9 kg     Height:        Intake/Output Summary (Last 24 hours) at 10/10/2021 1444 Last data filed at 10/10/2021 1300 Gross per 24 hour  Intake 945.48 ml  Output 300 ml  Net 645.48 ml    [employer]  10/09/21 0847 10/09/21 1300 10/10/21 0452  Weight: 38.3 kg 36.3 kg 35.9 kg    Examination:  General exam: NAD Respiratory system: BL ronchus Cardiovascular system: S 1, S 2  RRR Gastrointestinal system: BS present, soft , nt Central nervous system:  Alert, follows command Extremities: No edema   Data Reviewed: I have personally reviewed following labs and imaging studies  CBC: Recent Labs  Lab 10/03/21 2023 10/03/21 2029 10/04/21 0126 10/04/21 1833 10/05/21 0542 10/06/21 0116 10/09/21 0105  WBC 14.1*  --  7.6 11.2* 11.4* 10.7* 7.3  NEUTROABS 13.5*  --   --   --   --   --  5.9  HGB 8.4*   < > 7.0* 9.6* 9.7* 9.8* 7.9*  HCT 26.7*   < > 22.3* 29.2* 29.4* 30.5* 24.3*  MCV 97.8  --  97.4 95.1 97.0 97.8 96.8  PLT 291  --  229 246 214 193 225   < > = values in this interval not displayed.    Basic Metabolic Panel: Recent Labs  Lab 10/04/21 0126 10/04/21 1603 10/05/21 0542 10/05/21 1636 10/06/21 0116 10/07/21 0048 10/08/21 0604 10/09/21 0105 10/10/21 0103  NA 132*   < > 135   < > 136 127* 132* 135 134*  K 4.7   < > 4.3   < > 4.6 4.8 3.6 4.2 3.6  CL 95*   < > 102   < > 100 94* 98 100 96*  CO2 25   < > 22   < > 27 23 28 27 25   GLUCOSE 208*   < > 81   < > 94 75 112* 99 134*  BUN 46*   < > 21*   < > 15 39* 18 34* 20  CREATININE 2.74*   < > 1.34*   < > 1.16* 2.57* 1.84* 3.11* 2.11*  CALCIUM 8.6*   < > 8.3*   < > 8.5* 8.1* 7.8* 7.8* 8.5*  MG 1.8  --  2.4  --  2.3 1.9  --   --   --   PHOS 3.9   < > 2.6   < > 3.6 4.4 3.1 3.8 3.0   < > = values in this interval not displayed.    GFR: Estimated Creatinine Clearance: 16.5 mL/min (A) (by C-G formula based on SCr of 2.11 mg/dL (H)). Liver Function Tests: Recent Labs  Lab 10/03/21 2023 10/04/21 1603 10/07/21 0048 10/08/21 0604 10/09/21 0105 10/10/21 0103 10/10/21 1031  AST 41  --   --   --   --   --  27  ALT 55*  --   --   --   --   --  35  ALKPHOS 158*  --   --   --   --   --  97  BILITOT 1.2  --   --   --   --   --  0.7  PROT 6.4*  --   --   --   --   --  5.8*  ALBUMIN 3.5   < > 2.5* 2.1* 2.0* 2.3* 2.3*   < > = values in this interval not displayed.    No results for input(s): LIPASE, AMYLASE in the last 168 hours. No results for input(s): AMMONIA in the last  168 hours. Coagulation Profile: No results for input(s): INR, PROTIME in the last 168 hours. Cardiac Enzymes: No results for input(s): CKTOTAL, CKMB, CKMBINDEX, TROPONINI in the last 168 hours. BNP (last 3 results)  No results for input(s): PROBNP in the last 8760 hours. HbA1C: No results for input(s): HGBA1C in the last 72 hours. CBG: Recent Labs  Lab 10/04/21 0001 10/04/21 1942  GLUCAP 114* 99    Lipid Profile: Recent Labs    10/08/21 0604  TRIG 50    Thyroid Function Tests: No results for input(s): TSH, T4TOTAL, FREET4, T3FREE, THYROIDAB in the last 72 hours. Anemia Panel: No results for input(s): VITAMINB12, FOLATE, FERRITIN, TIBC, IRON, RETICCTPCT in the last 72 hours. Sepsis Labs: Recent Labs  Lab 10/09/21 0105  PROCALCITON 1.20     Recent Results (from the past 240 hour(s))  MRSA Next Gen by PCR, Nasal     Status: None   Collection Time: 10/04/21  2:49 AM   Specimen: Nasal Mucosa; Nasal Swab  Result Value Ref Range Status   MRSA by PCR Next Gen NOT DETECTED NOT DETECTED Final    Comment: (NOTE) The GeneXpert MRSA Assay (FDA approved for NASAL specimens only), is one component of a comprehensive MRSA colonization surveillance program. It is not intended to diagnose MRSA infection nor to guide or monitor treatment for MRSA infections. Test performance is not FDA approved in patients less than 60 years old. Performed at [facility], [place] [address], [place], [place] [address]   Culture, body fluid w Gram Stain-bottle     Status: None (Preliminary result)   Collection Time: 10/09/21  3:16 PM   Specimen: Fluid  Result Value Ref Range Status   Specimen Description FLUID PLEURAL  Final   Special Requests BOTTLES DRAWN AEROBIC AND ANAEROBIC  Final   Culture   Final    NO GROWTH < 24 HOURS Performed at [facility], [place] [address], [place], [place] [address]    Report Status PENDING  Incomplete  Gram stain     Status: None   Collection Time:  10/09/21  3:16 PM   Specimen: Fluid  Result Value Ref Range Status   Specimen Description FLUID PLEURAL  Final   Special Requests NONE  Final   Gram Stain   Final    FEW WBC PRESENT,BOTH PMN AND MONONUCLEAR NO ORGANISMS SEEN Performed at [facility], [address], [place], [place] [address]    Report Status 10/10/2021 FINAL  Final          Radiology Studies: DG Chest 1 View  Result Date: 10/09/2021 CLINICAL DATA:  Status post right thoracentesis. EXAM: CHEST  1 VIEW COMPARISON:  October 08, 2021. FINDINGS: No pneumothorax status post right thoracentesis. Minimal right pleural effusion is noted. IMPRESSION: No pneumothorax status post right thoracentesis. Electronically Signed   By: Marijo Conception M.D.   On: 10/09/2021 15:05   DG Chest 2 View  Result Date: 10/08/2021 CLINICAL DATA:  Respiratory distress.  Cough. EXAM: CHEST - 2 VIEW COMPARISON:  10/04/2021, CT 10/01/2021 FINDINGS: Right-sided dialysis catheter remains in place. Grossly unchanged cardiomegaly. Worsening right but slight improvement in left pleural effusion, as well as associated bibasilar opacities. Diffuse interstitial opacities suspicious for pulmonary edema, mildly improved. No pneumothorax. IMPRESSION: 1. Worsening right but slight improvement in left pleural effusion, as well as associated basilar opacities. 2. Stable cardiomegaly. Mildly improved interstitial opacities likely improving edema. Electronically Signed   By: Keith Rake M.D.   On: 10/08/2021 17:37   IR THORACENTESIS ASP PLEURAL SPACE W/IMG GUIDE  Result Date: 10/09/2021 INDICATION: Patient history of end-stage renal disease on hemodialysis, COPD. Admitted for respiratory failure found to have flash  pulmonary edema with small right-sided pleural effusion. Request is for therapeutic and diagnostic thoracentesis. EXAM: ULTRASOUND GUIDED DIAGNOSTIC AND THERAPEUTIC RIGHT-SIDED THORACENTESIS MEDICATIONS: Lidocaine 1% 10 mL COMPLICATIONS: None  immediate. PROCEDURE: An ultrasound guided thoracentesis was thoroughly discussed with the patient and questions answered. The benefits, risks, alternatives and complications were also discussed. The patient understands and wishes to proceed with the procedure. Written consent was obtained. Ultrasound was performed to localize and mark an adequate pocket of fluid in the right chest. The area was then prepped and draped in the normal sterile fashion. 1% Lidocaine was used for local anesthesia. Under ultrasound guidance a 19 gauge, 7-cm, Yueh catheter was introduced. Thoracentesis was performed. The catheter was removed and a dressing applied. FINDINGS: A total of approximately 280 mL of straw-colored fluid was removed. Samples were sent to the laboratory as requested by the clinical team. IMPRESSION: Successful ultrasound guided diagnostic and therapeutic right-sided thoracentesis yielding 280 mL of pleural fluid. Read by: Rushie Nyhan, NP Electronically Signed   By: Sandi Mariscal M.D.   On: 10/09/2021 15:18        Scheduled Meds:  amLODipine  10 mg Oral Daily   arformoterol  15 mcg Nebulization BID   vitamin C  500 mg Oral BID   atorvastatin  80 mg Oral QHS   budesonide (PULMICORT) nebulizer solution  0.25 mg Nebulization BID   calcitRIOL  0.5 mcg Oral Q M,W,F-HD   calcium acetate  667 mg Oral TID WC   Chlorhexidine Gluconate Cloth  6 each Topical Q0600   [START ON 10/12/2021] darbepoetin (ARANESP) injection - DIALYSIS  100 mcg Subcutaneous Q Thu-HD   ezetimibe  10 mg Oral Daily   feeding supplement  237 mL Oral TID BM   folic acid  1 mg Oral Daily   guaiFENesin  600 mg Oral BID   ipratropium-albuterol  3 mL Nebulization BID   isosorbide mononitrate  60 mg Oral Daily   linaclotide  72 mcg Oral QAC breakfast   loratadine  10 mg Oral Daily   losartan  50 mg Oral Daily   megestrol  40 mg Oral Daily   multivitamin  1 tablet Oral QHS   pantoprazole  40 mg Oral Daily   venlafaxine XR  300 mg  Oral Q breakfast   vitamin B-12  500 mcg Oral Daily   Continuous Infusions:  sodium chloride Stopped (10/06/21 1929)   cefTRIAXone (ROCEPHIN)  IV 1 g (10/10/21 1402)     LOS: 7 days    Time spent: 35 minutes.     Elmarie Shiley, MD [facility]   If 7PM-7AM, please contact night-coverage [email or web address]  10/10/2021, 2:44 PM

## 2021-10-10 NOTE — Progress Notes (Signed)
RT went to pt room to place pt on bipap for the night, pt wasn't ready, RN aware and stated she would call when pt was ready.

## 2021-10-11 ENCOUNTER — Encounter (HOSPITAL_COMMUNITY): Payer: Self-pay

## 2021-10-11 LAB — CBC
HCT: 23.9 % — ABNORMAL LOW (ref 36.0–46.0)
Hemoglobin: 7.6 g/dL — ABNORMAL LOW (ref 12.0–15.0)
MCH: 31.3 pg (ref 26.0–34.0)
MCHC: 31.8 g/dL (ref 30.0–36.0)
MCV: 98.4 fL (ref 80.0–100.0)
Platelets: 313 10*3/uL (ref 150–400)
RBC: 2.43 MIL/uL — ABNORMAL LOW (ref 3.87–5.11)
RDW: 16.9 % — ABNORMAL HIGH (ref 11.5–15.5)
WBC: 6.9 10*3/uL (ref 4.0–10.5)
nRBC: 0 % (ref 0.0–0.2)

## 2021-10-11 LAB — RENAL FUNCTION PANEL
Albumin: 2.2 g/dL — ABNORMAL LOW (ref 3.5–5.0)
Anion gap: 13 (ref 5–15)
BUN: 39 mg/dL — ABNORMAL HIGH (ref 6–20)
CO2: 22 mmol/L (ref 22–32)
Calcium: 8.8 mg/dL — ABNORMAL LOW (ref 8.9–10.3)
Chloride: 96 mmol/L — ABNORMAL LOW (ref 98–111)
Creatinine, Ser: 3.28 mg/dL — ABNORMAL HIGH (ref 0.44–1.00)
GFR, Estimated: 16 mL/min — ABNORMAL LOW (ref >60)
Glucose, Bld: 82 mg/dL (ref 70–99)
Phosphorus: 4.7 mg/dL — ABNORMAL HIGH (ref 2.5–4.6)
Potassium: 4.4 mmol/L (ref 3.5–5.1)
Sodium: 131 mmol/L — ABNORMAL LOW (ref 135–145)

## 2021-10-11 LAB — TRIGLYCERIDES: Triglycerides: 49 mg/dL (ref <150)

## 2021-10-11 MED ORDER — SODIUM CHLORIDE 0.9 % IV SOLN: 100.0000 mL | INTRAVENOUS | Status: DC | PRN

## 2021-10-11 MED ORDER — ALTEPLASE 2 MG IJ SOLR: 2.0000 mg | Freq: Once | INTRAMUSCULAR | Status: DC | PRN

## 2021-10-11 MED ORDER — PENTAFLUOROPROP-TETRAFLUOROETH EX AERO: 1.0000 "application " | INHALATION_SPRAY | CUTANEOUS | Status: DC | PRN

## 2021-10-11 MED ORDER — DARBEPOETIN ALFA 150 MCG/0.3ML IJ SOSY: 150.0000 ug | PREFILLED_SYRINGE | INTRAMUSCULAR | Status: DC

## 2021-10-11 MED ORDER — LIDOCAINE HCL (PF) 1 % IJ SOLN: 5.0000 mL | INTRAMUSCULAR | Status: DC | PRN

## 2021-10-11 MED ORDER — PROSOURCE PLUS PO LIQD
30.0000 mL | Freq: Two times a day (BID) | ORAL | Status: DC
  Administered 2021-10-11 – 2021-10-12 (×2): 30 mL via ORAL
  Filled 2021-10-11: qty 30

## 2021-10-11 MED ORDER — LIDOCAINE-PRILOCAINE 2.5-2.5 % EX CREA: 1.0000 "application " | TOPICAL_CREAM | CUTANEOUS | Status: DC | PRN

## 2021-10-11 MED ORDER — DARBEPOETIN ALFA 150 MCG/0.3ML IJ SOSY
PREFILLED_SYRINGE | INTRAMUSCULAR | Status: AC
  Administered 2021-10-11: 150 ug via SUBCUTANEOUS
  Filled 2021-10-11: qty 0.3

## 2021-10-11 MED ORDER — HEPARIN SODIUM (PORCINE) 1000 UNIT/ML DIALYSIS: 1000.0000 [IU] | INTRAMUSCULAR | Status: DC | PRN

## 2021-10-11 MED ORDER — CALCITRIOL 0.25 MCG PO CAPS
0.2500 ug | ORAL_CAPSULE | ORAL | Status: DC
  Administered 2021-10-11: 0.25 ug via ORAL
  Filled 2021-10-11: qty 1

## 2021-10-11 NOTE — Progress Notes (Signed)
PROGRESS NOTE    [name]  WUJ:[id number] DOB: April 19, 1961 DOA: 10/03/2021 PCP: Bernerd Limbo, MD    Chief Complaint  Patient presents with   Respiratory Distress    Brief Narrative:  61 year old with past medical history significant for ESRD on hemodialysis MWF, hypertension, hyperlipidemia, COPD, chronic hypoxic respiratory failure 2 L of oxygen who presents on 1/4 with worsening shortness of breath.  Patient was found to have hypertension urgency with flash pulmonary edema. Patient was a started on nitroglycerin drip, BiPAP admitted to ICU, she received urgent dialysis.  Her condition improved she was transferred to hospitalist service 1/7.   Of note patient had a recent hospitalization from 09/29/2021 through 10/01/2021 for dyspnea related to volume overload andpossibleCOPDexacerbation.  She has not missed any hemodialysis.   Assessment & Plan:   Principal Problem:   Hypertensive urgency   Acute on chronic hypoxic respiratory failure Secondary to flash pulmonary edema from acute hypertensive emergency with a component of COPD exacerbation  Patient received urgent HD and is currently requiring BiPAP. Patient is also on 2 L of nasal cannula oxygen during the day. Patient also underwent Thora centesis on 10/09/2021 yielding about 280 mL of fluid probably transudate from volume overload. So far cultures have been negative.  On IV antibiotics empirically. Patient appears to require BiPAP at night.   Hypertension Continue with Norvasc Imdur and Cozaar.    End-stage renal disease on dialysis Monday Wednesday Friday Patient has a TDC/left upper extremity AV graft Nephrology on board appreciate recommendations   Hyperlipidemia    COPD no wheezing heard on exam today. Continue with bronchodilators as needed.    History of bipolar disorder, anxiety and chronic pain syndrome Resume home medications.   Severe protein malnutrition in the setting of chronic  illness Dietary on board.    DVT prophylaxis: scd's Code Status: DNR Family Communication: None at bedside.  Disposition:   Status is: Inpatient  Remains inpatient appropriate because: unsafe d/c due to respiratory issues.        Consultants:  Nephrology.  IR  Procedures: Thoracentesis   Antimicrobials:  Antibiotics Given (last 72 hours)     Date/Time Action Medication Dose Rate   10/09/21 1555 New Bag/Given   cefTRIAXone (ROCEPHIN) 1 g in sodium chloride 0.9 % 100 mL IVPB 1 g 200 mL/hr   10/10/21 1402 New Bag/Given   cefTRIAXone (ROCEPHIN) 1 g in sodium chloride 0.9 % 100 mL IVPB 1 g 200 mL/hr        Subjective: Sob, exhausted, not feeling good.   Objective: Vitals:   10/11/21 1030 10/11/21 1100 10/11/21 1202 10/11/21 1222  BP: (!) 151/71 (!) 158/72 (!) 185/87 (!) 155/75  Pulse: (!) 108 (!) 107 (!) 107 (!) 110  Resp: (!) 25 (!) 26  15  Temp:    98.1 F (36.7 C)  TempSrc:    Oral  SpO2: 95% 95%  94%  Weight:      Height:        Intake/Output Summary (Last 24 hours) at 10/11/2021 1247 Last data filed at 10/10/2021 2117 Gross per 24 hour  Intake 480 ml  Output --  Net 480 ml   Filed Weights   10/10/21 0452 10/11/21 0453 10/11/21 0729  Weight: 35.9 kg 37 kg 36.1 kg    Examination:  General exam: Appears calm and comfortable  Respiratory system: Clear to auscultation. Respiratory effort normal. Cardiovascular system: S1 & S2 heard,tachycardic, no JVD.  Gastrointestinal system: Abdomen is nondistended, soft and nontender. Normal  bowel sounds heard. Central nervous system: Alert and oriented. No focal neurological deficits. Extremities: Symmetric 5 x 5 power. Skin: No rashes, lesions or ulcers Psychiatry: Mood & affect appropriate.     Data Reviewed: I have personally reviewed following labs and imaging studies  CBC: Recent Labs  Lab 10/04/21 1833 10/05/21 0542 10/06/21 0116 10/09/21 0105 10/11/21 0618  WBC 11.2* 11.4* 10.7* 7.3 6.9   NEUTROABS  --   --   --  5.9  --   HGB 9.6* 9.7* 9.8* 7.9* 7.6*  HCT 29.2* 29.4* 30.5* 24.3* 23.9*  MCV 95.1 97.0 97.8 96.8 98.4  PLT 246 214 193 225 176    Basic Metabolic Panel: Recent Labs  Lab 10/05/21 0542 10/05/21 1636 10/06/21 0116 10/07/21 0048 10/08/21 0604 10/09/21 0105 10/10/21 0103 10/11/21 0618  NA 135   < > 136 127* 132* 135 134* 131*  K 4.3   < > 4.6 4.8 3.6 4.2 3.6 4.4  CL 102   < > 100 94* 98 100 96* 96*  CO2 22   < > 27 23 28 27 25 22   GLUCOSE 81   < > 94 75 112* 99 134* 82  BUN 21*   < > 15 39* 18 34* 20 39*  CREATININE 1.34*   < > 1.16* 2.57* 1.84* 3.11* 2.11* 3.28*  CALCIUM 8.3*   < > 8.5* 8.1* 7.8* 7.8* 8.5* 8.8*  MG 2.4  --  2.3 1.9  --   --   --   --   PHOS 2.6   < > 3.6 4.4 3.1 3.8 3.0 4.7*   < > = values in this interval not displayed.    GFR: Estimated Creatinine Clearance: 10.7 mL/min (A) (by C-G formula based on SCr of 3.28 mg/dL (H)).  Liver Function Tests: Recent Labs  Lab 10/08/21 0604 10/09/21 0105 10/10/21 0103 10/10/21 1031 10/11/21 0618  AST  --   --   --  27  --   ALT  --   --   --  35  --   ALKPHOS  --   --   --  97  --   BILITOT  --   --   --  0.7  --   PROT  --   --   --  5.8*  --   ALBUMIN 2.1* 2.0* 2.3* 2.3* 2.2*    CBG: Recent Labs  Lab 10/04/21 1942  GLUCAP 99     Recent Results (from the past 240 hour(s))  MRSA Next Gen by PCR, Nasal     Status: None   Collection Time: 10/04/21  2:49 AM   Specimen: Nasal Mucosa; Nasal Swab  Result Value Ref Range Status   MRSA by PCR Next Gen NOT DETECTED NOT DETECTED Final    Comment: (NOTE) The GeneXpert MRSA Assay (FDA approved for NASAL specimens only), is one component of a comprehensive MRSA colonization surveillance program. It is not intended to diagnose MRSA infection nor to guide or monitor treatment for MRSA infections. Test performance is not FDA approved in patients less than 6 years old. Performed at [facility], [place] [address] [place],  [place] [address]   Culture, body fluid w Gram Stain-bottle     Status: None (Preliminary result)   Collection Time: 10/09/21  3:16 PM   Specimen: Fluid  Result Value Ref Range Status   Specimen Description FLUID PLEURAL  Final   Special Requests BOTTLES DRAWN AEROBIC AND ANAEROBIC  Final  Culture   Final    NO GROWTH < 24 HOURS Performed at [facility], [place] [address], [place], [place] [address]    Report Status PENDING  Incomplete  Gram stain     Status: None   Collection Time: 10/09/21  3:16 PM   Specimen: Fluid  Result Value Ref Range Status   Specimen Description FLUID PLEURAL  Final   Special Requests NONE  Final   Gram Stain   Final    FEW WBC PRESENT,BOTH PMN AND MONONUCLEAR NO ORGANISMS SEEN Performed at [facility], [address], [place], [place] [address]    Report Status 10/10/2021 FINAL  Final         Radiology Studies: DG Chest 1 View  Result Date: 10/09/2021 CLINICAL DATA:  Status post right thoracentesis. EXAM: CHEST  1 VIEW COMPARISON:  October 08, 2021. FINDINGS: No pneumothorax status post right thoracentesis. Minimal right pleural effusion is noted. IMPRESSION: No pneumothorax status post right thoracentesis. Electronically Signed   By: Marijo Conception M.D.   On: 10/09/2021 15:05   IR THORACENTESIS ASP PLEURAL SPACE W/IMG GUIDE  Result Date: 10/09/2021 INDICATION: Patient history of end-stage renal disease on hemodialysis, COPD. Admitted for respiratory failure found to have flash pulmonary edema with small right-sided pleural effusion. Request is for therapeutic and diagnostic thoracentesis. EXAM: ULTRASOUND GUIDED DIAGNOSTIC AND THERAPEUTIC RIGHT-SIDED THORACENTESIS MEDICATIONS: Lidocaine 1% 10 mL COMPLICATIONS: None immediate. PROCEDURE: An ultrasound guided thoracentesis was thoroughly discussed with the patient and questions answered. The benefits, risks, alternatives and complications were also discussed. The patient understands and wishes to  proceed with the procedure. Written consent was obtained. Ultrasound was performed to localize and mark an adequate pocket of fluid in the right chest. The area was then prepped and draped in the normal sterile fashion. 1% Lidocaine was used for local anesthesia. Under ultrasound guidance a 19 gauge, 7-cm, Yueh catheter was introduced. Thoracentesis was performed. The catheter was removed and a dressing applied. FINDINGS: A total of approximately 280 mL of straw-colored fluid was removed. Samples were sent to the laboratory as requested by the clinical team. IMPRESSION: Successful ultrasound guided diagnostic and therapeutic right-sided thoracentesis yielding 280 mL of pleural fluid. Read by: Rushie Nyhan, NP Electronically Signed   By: Sandi Mariscal M.D.   On: 10/09/2021 15:18        Scheduled Meds:  (feeding supplement) PROSource Plus  30 mL Oral BID BM   amLODipine  10 mg Oral Daily   arformoterol  15 mcg Nebulization BID   vitamin C  500 mg Oral BID   atorvastatin  80 mg Oral QHS   budesonide (PULMICORT) nebulizer solution  0.25 mg Nebulization BID   calcitRIOL  0.25 mcg Oral Q M,W,F-HD   calcium acetate  667 mg Oral TID WC   Chlorhexidine Gluconate Cloth  6 each Topical Q0600   darbepoetin (ARANESP) injection - DIALYSIS  150 mcg Subcutaneous Q Wed-HD   ezetimibe  10 mg Oral Daily   feeding supplement  237 mL Oral TID BM   folic acid  1 mg Oral Daily   guaiFENesin  600 mg Oral BID   ipratropium-albuterol  3 mL Nebulization BID   isosorbide mononitrate  60 mg Oral Daily   linaclotide  72 mcg Oral QAC breakfast   loratadine  10 mg Oral Daily   losartan  50 mg Oral Daily   megestrol  40 mg Oral Daily   multivitamin  1 tablet Oral QHS  pantoprazole  40 mg Oral Daily   venlafaxine XR  300 mg Oral Q breakfast   vitamin B-12  500 mcg Oral Daily   Continuous Infusions:  sodium chloride Stopped (10/06/21 1929)   cefTRIAXone (ROCEPHIN)  IV 1 g (10/10/21 1402)     LOS: 8 days     Time spent: 38 minutes     Hosie Poisson, MD [facility]   To contact the attending provider between 7A-7P or the covering provider during after hours 7P-7A, please log into the web site [email or web address] and access using universal  password for that web site. If you do not have the password, please call the hospital operator.  10/11/2021, 12:47 PM

## 2021-10-11 NOTE — Progress Notes (Signed)
Pt placed on overnight pulse ox study for the night.  Pt is currently on 2 lpm nasal canula

## 2021-10-11 NOTE — Plan of Care (Signed)
  Problem: Education: Goal: Knowledge of General Education information will improve Description Including pain rating scale, medication(s)/side effects and non-pharmacologic comfort measures Outcome: Progressing   

## 2021-10-11 NOTE — Progress Notes (Signed)
[facility] Progress Note   Subjective: Seen on HD. Initially HR and BP very high but both have stablized as HD has progressed. Still hypertensive. On RA. Denies SOB.     Objective Vitals:   10/11/21 0900 10/11/21 0930 10/11/21 1000 10/11/21 1030  BP: (!) 187/83 (!) 171/57 (!) 165/82 (!) 151/71  Pulse: 97 99 (!) 102   Resp: (!) 23 19 18    Temp:      TempSrc:      SpO2: 94% 91% 93%   Weight:      Height:       Physical Exam General: Chronically ill appearing female, looks older than stated age.  Heart: S1,S2 irreg. No M/R/G Lungs: Decreased in bases otherwise CTAB. No WOB.  Abdomen: NABS, NT, ND Extremities: No LE edema.  Dialysis Access: RIJ TDC drsg intact. L AVG + T/B    Additional Objective Labs: Basic Metabolic Panel: Recent Labs  Lab 10/09/21 0105 10/10/21 0103 10/11/21 0618  NA 135 134* 131*  K 4.2 3.6 4.4  CL 100 96* 96*  CO2 27 25 22   GLUCOSE 99 134* 82  BUN 34* 20 39*  CREATININE 3.11* 2.11* 3.28*  CALCIUM 7.8* 8.5* 8.8*  PHOS 3.8 3.0 4.7*   Liver Function Tests: Recent Labs  Lab 10/10/21 0103 10/10/21 1031 10/11/21 0618  AST  --  27  --   ALT  --  35  --   ALKPHOS  --  97  --   BILITOT  --  0.7  --   PROT  --  5.8*  --   ALBUMIN 2.3* 2.3* 2.2*   No results for input(s): LIPASE, AMYLASE in the last 168 hours. CBC: Recent Labs  Lab 10/04/21 1833 10/05/21 0542 10/06/21 0116 10/09/21 0105  WBC 11.2* 11.4* 10.7* 7.3  NEUTROABS  --   --   --  5.9  HGB 9.6* 9.7* 9.8* 7.9*  HCT 29.2* 29.4* 30.5* 24.3*  MCV 95.1 97.0 97.8 96.8  PLT 246 214 193 225   Blood Culture    Component Value Date/Time   SDES FLUID PLEURAL 10/09/2021 1516   SDES FLUID PLEURAL 10/09/2021 1516   SPECREQUEST BOTTLES DRAWN AEROBIC AND ANAEROBIC 10/09/2021 1516   SPECREQUEST NONE 10/09/2021 1516   CULT  10/09/2021 1516    NO GROWTH < 24 HOURS Performed at [facility] [address] [place], [place] [address]    REPTSTATUS PENDING 10/09/2021  1516   REPTSTATUS 10/10/2021 FINAL 10/09/2021 1516    Cardiac Enzymes: No results for input(s): CKTOTAL, CKMB, CKMBINDEX, TROPONINI in the last 168 hours. CBG: Recent Labs  Lab 10/04/21 1942  GLUCAP 99   Iron Studies: No results for input(s): IRON, TIBC, TRANSFERRIN, FERRITIN in the last 72 hours. @lablastinr3 @ Studies/Results: DG Chest 1 View  Result Date: 10/09/2021 CLINICAL DATA:  Status post right thoracentesis. EXAM: CHEST  1 VIEW COMPARISON:  October 08, 2021. FINDINGS: No pneumothorax status post right thoracentesis. Minimal right pleural effusion is noted. IMPRESSION: No pneumothorax status post right thoracentesis. Electronically Signed   By: Marijo Conception M.D.   On: 10/09/2021 15:05   IR THORACENTESIS ASP PLEURAL SPACE W/IMG GUIDE  Result Date: 10/09/2021 INDICATION: Patient history of end-stage renal disease on hemodialysis, COPD. Admitted for respiratory failure found to have flash pulmonary edema with small right-sided pleural effusion. Request is for therapeutic and diagnostic thoracentesis. EXAM: ULTRASOUND GUIDED DIAGNOSTIC AND THERAPEUTIC RIGHT-SIDED THORACENTESIS MEDICATIONS: Lidocaine 1% 10 mL COMPLICATIONS: None immediate. PROCEDURE: An ultrasound guided  thoracentesis was thoroughly discussed with the patient and questions answered. The benefits, risks, alternatives and complications were also discussed. The patient understands and wishes to proceed with the procedure. Written consent was obtained. Ultrasound was performed to localize and mark an adequate pocket of fluid in the right chest. The area was then prepped and draped in the normal sterile fashion. 1% Lidocaine was used for local anesthesia. Under ultrasound guidance a 19 gauge, 7-cm, Yueh catheter was introduced. Thoracentesis was performed. The catheter was removed and a dressing applied. FINDINGS: A total of approximately 280 mL of straw-colored fluid was removed. Samples were sent to the laboratory as requested by  the clinical team. IMPRESSION: Successful ultrasound guided diagnostic and therapeutic right-sided thoracentesis yielding 280 mL of pleural fluid. Read by: Rushie Nyhan, NP Electronically Signed   By: Sandi Mariscal M.D.   On: 10/09/2021 15:18   Medications:  sodium chloride Stopped (10/06/21 1929)   sodium chloride     sodium chloride     cefTRIAXone (ROCEPHIN)  IV 1 g (10/10/21 1402)    amLODipine  10 mg Oral Daily   arformoterol  15 mcg Nebulization BID   vitamin C  500 mg Oral BID   atorvastatin  80 mg Oral QHS   budesonide (PULMICORT) nebulizer solution  0.25 mg Nebulization BID   calcitRIOL  0.5 mcg Oral Q M,W,F-HD   calcium acetate  667 mg Oral TID WC   Chlorhexidine Gluconate Cloth  6 each Topical Q0600   [START ON 10/12/2021] darbepoetin (ARANESP) injection - DIALYSIS  100 mcg Subcutaneous Q Thu-HD   ezetimibe  10 mg Oral Daily   feeding supplement  237 mL Oral TID BM   folic acid  1 mg Oral Daily   guaiFENesin  600 mg Oral BID   ipratropium-albuterol  3 mL Nebulization BID   isosorbide mononitrate  60 mg Oral Daily   linaclotide  72 mcg Oral QAC breakfast   loratadine  10 mg Oral Daily   losartan  50 mg Oral Daily   megestrol  40 mg Oral Daily   multivitamin  1 tablet Oral QHS   pantoprazole  40 mg Oral Daily   venlafaxine XR  300 mg Oral Q breakfast   vitamin B-12  500 mcg Oral Daily     OP HD: MWF GO   3h 36min  40.8kg  3K/2.5 bath  Hep none  TDC/ LUA AVG done 09/14/21  - calcitriol 0.5 mcg po tiw  - mircera 75 mcg on 09/15/21     Assessment/Plan:  Acute Flash Pulmonary Edema: Resolved with HD. Continue lowering volume as tolerated. Nadir wt 35.9 kg. Per primary. Hypertensive urgency - Still hypertensive but improved with medication adjustments and lowering volume. Increased isosorbide mononitrate to 60 mg qd and started losartan 25 mg qd and titrating to 50 mg daily.  COPD - per pulmonology. Unsure of her baseline respiratory status but suspect she has limited  reserve. R Pleural Effusion: Thoracentesis 01/09 yield 280cc, probably transudate.  ESRD - HD MWF. S/p CRRT 1/4 - 1/6 and then iHD on 1/7. Next HD 10/13/2021.  Anemia of Chronic Kidney Disease: note Hb drop.  S/p PRBC's on 10/04/21. Now on MWF schedule. HGB down to 7.9 on 01/09. Todays CBC results not available. Increase Aranesp to 150 mcg IV and give today.  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 Decrease VDRA. PO4 at goal. Has resumed binders. Nutrition: Albumin low. Add protein supps.     Asaiah Scarber H. Garv Kuechle NP-C 10/11/2021, 11:03  AM  [employer] [phone]

## 2021-10-11 NOTE — TOC Progression Note (Addendum)
Transition of Care [facility]) - Progression Note    Patient Details  Name: [name] MRN: [id number] Date of Birth: [birth year]  [facility] CM/SW Contact  Graves-Bigelow, Ocie Cornfield, RN Phone Number: 10/11/2021, 11:51 AM  Clinical Narrative:  Case Manager received call from [employer] that the patient will not qualify for BIPAP- Per PC02 patient is not hypercapnic and insurance will not approve BIPAP. Information provided to Attending. Patient has oxygen in the home via [employer] at 3 Liters. Case Manager will continue to follow for additional transition of care needs.   Expected Discharge Plan: [place] Barriers to Discharge: Continued Medical Work up  Expected Discharge Plan and Services Expected Discharge Plan: [place] In-house Referral: NA Discharge Planning Services: CM Consult Post Acute Care Choice: Resumption of Svcs/PTA Provider, Home Health Living arrangements for the past 2 months: Single Family Home                     HH Arranged: PT, OT [facility] Agency: [place] Date [facility] Agency Contacted: 10/04/21 Time [place]: 1620 Representative spoke with at [place]: [place]      Readmission Risk Interventions Readmission Risk Prevention Plan 10/04/2021  Transportation Screening Complete  Medication Review [employer]) Complete  PCP or Specialist appointment within 3-5 days of discharge Complete  HRI or [place] Complete  SW Recovery Care/Counseling Consult Complete  [place] Not Applicable  Some recent data might be hidden

## 2021-10-12 DIAGNOSIS — J441 Chronic obstructive pulmonary disease with (acute) exacerbation: Secondary | ICD-10-CM

## 2021-10-12 LAB — RENAL FUNCTION PANEL
Albumin: 1.8 g/dL — ABNORMAL LOW (ref 3.5–5.0)
Anion gap: 11 (ref 5–15)
BUN: 27 mg/dL — ABNORMAL HIGH (ref 6–20)
CO2: 29 mmol/L (ref 22–32)
Calcium: 8.5 mg/dL — ABNORMAL LOW (ref 8.9–10.3)
Chloride: 95 mmol/L — ABNORMAL LOW (ref 98–111)
Creatinine, Ser: 2.29 mg/dL — ABNORMAL HIGH (ref 0.44–1.00)
GFR, Estimated: 24 mL/min — ABNORMAL LOW (ref >60)
Glucose, Bld: 88 mg/dL (ref 70–99)
Phosphorus: 3.2 mg/dL (ref 2.5–4.6)
Potassium: 4.1 mmol/L (ref 3.5–5.1)
Sodium: 135 mmol/L (ref 135–145)

## 2021-10-12 LAB — BLOOD GAS, VENOUS
Acid-Base Excess: 7 mmol/L — ABNORMAL HIGH (ref 0.0–2.0)
Bicarbonate: 31.3 mmol/L — ABNORMAL HIGH (ref 20.0–28.0)
Drawn by: 5287
FIO2: 32
O2 Saturation: 39.6 %
Patient temperature: 37.3
pCO2, Ven: 48.6 mmHg (ref 44.0–60.0)
pH, Ven: 7.426 (ref 7.250–7.430)
pO2, Ven: <31 mmHg — CL (ref 32.0–45.0)

## 2021-10-12 MED ORDER — FOLIC ACID 1 MG PO TABS: 1.0000 mg | ORAL_TABLET | Freq: Every day | ORAL | Status: AC

## 2021-10-12 MED ORDER — CALCIUM ACETATE (PHOS BINDER) 667 MG PO CAPS: 667.0000 mg | ORAL_CAPSULE | Freq: Three times a day (TID) | ORAL | 3 refills | Status: AC

## 2021-10-12 MED ORDER — LOSARTAN POTASSIUM 50 MG PO TABS: 50.0000 mg | ORAL_TABLET | Freq: Every day | ORAL | 1 refills | Status: AC

## 2021-10-12 MED ORDER — ASCORBIC ACID 500 MG PO TABS: 500.0000 mg | ORAL_TABLET | Freq: Two times a day (BID) | ORAL | Status: AC

## 2021-10-12 MED ORDER — GUAIFENESIN 100 MG/5ML PO LIQD
5.0000 mL | ORAL | 0 refills | Status: AC | PRN
Start: 2021-10-12 — End: ?

## 2021-10-12 MED ORDER — ISOSORBIDE MONONITRATE ER 60 MG PO TB24
60.0000 mg | ORAL_TABLET | Freq: Every day | ORAL | 2 refills | Status: AC
Start: 2021-10-13 — End: ?

## 2021-10-12 NOTE — Plan of Care (Signed)
  Problem: Education: Goal: Knowledge of General Education information will improve Description: Including pain rating scale, medication(s)/side effects and non-pharmacologic comfort measures Outcome: Adequate for Discharge   

## 2021-10-12 NOTE — Progress Notes (Addendum)
Contacted attending regarding possible d/c date. Pt is due HD tomorrow. Will await response.   Melven Sartorius Renal Navigator [phone]  Addendum at 1:47 pm: Pt's RN advised navigator that MD plans to d/c pt today. Contacted [name] and spoke to [employer]. Clinic advised that pt will d/c today and will resume care tomorrow.

## 2021-10-12 NOTE — Progress Notes (Signed)
Physical Therapy Treatment Patient Details Name: [name] MRN: [id number] DOB: 1960-12-16 Today's Date: 10/12/2021   History of Present Illness Pt is a 61 y.o. female admitted 10/03/21 with dyspnea. Workup for HTN urgency, flash pulmonary edema; pt required ICU admission for nitro drip. PMH includes ESRD (HD MWF), HTN, HLD, COPD (wears 2L O2 baseline), bipolar disorder, chronic pain. Of note, recent admission 09/29/21-10/01/21 for volume overload, COPD exacerbation.    PT Comments    Pt tearful as she has difficulty with remembering things, this morning she can not remember if she has ordered breakfast but she is hungry so she thinks she hasn't. Once reassured pt agreeable to ambulation with therapy today. Pt able to ambulate in hallway with RW and contact guard assist, requiring standing rest breaks for 4/4 DoE however is able to maintain SpO2 >90%O2 with ambulation. D/c plans remain appropriate at this time. PT will continue to follow acutely.     Recommendations for follow up therapy are one component of a multi-disciplinary discharge planning process, led by the attending physician.  Recommendations may be updated based on patient status, additional functional criteria and insurance authorization.  Follow Up Recommendations  Home health PT     Assistance Recommended at Discharge Frequent or constant Supervision/Assistance  Patient can return home with the following A little help with walking and/or transfers;A little help with bathing/dressing/bathroom;Assistance with cooking/housework;Assist for transportation;Help with stairs or ramp for entrance;Direct supervision/assist for medications management;Direct supervision/assist for financial management   Equipment Recommendations  Wheelchair (measurements PT);Wheelchair cushion (measurements PT)    Recommendations for Other Services       Precautions / Restrictions Precautions Precautions: Fall;Other (comment) Precaution Comments:  Watch HR, wears 2L O2 baseline Restrictions Weight Bearing Restrictions: No     Mobility  Bed Mobility Overal bed mobility: Independent             General bed mobility comments: longsitting in bed on entry, moves to EoB with no assistance    Transfers Overall transfer level: Needs assistance Equipment used: None Transfers: Sit to/from Stand Sit to Stand: Min guard           General transfer comment: min guard for safety, refuses equipment "I don't need that thing"    Ambulation/Gait Ambulation/Gait assistance: Min guard;Min assist Gait Distance (Feet): 180 Feet Assistive device: Rolling walker (2 wheels) Gait Pattern/deviations: Decreased stride length;Step-through pattern;Wide base of support;Trunk flexed;Shuffle Gait velocity: decreased Gait velocity interpretation: <1.31 ft/sec, indicative of household ambulator   General Gait Details: light contact guard assist progressing to min guard for ambulation withRW, pt requires 2x standing rest breaks for 4/4 DoE          Balance Overall balance assessment: Needs assistance;History of Falls Sitting-balance support: Feet supported;No upper extremity supported Sitting balance-Leahy Scale: Good Sitting balance - Comments: can long sit and reach bilateral feet without assist; not tested at EOB   Standing balance support: During functional activity;No upper extremity supported Standing balance-Leahy Scale: Fair Standing balance comment: dynamic balance improved with single UE support                            Cognition Arousal/Alertness: Awake/alert Behavior During Therapy: WFL for tasks assessed/performed;Impulsive Overall Cognitive Status: No family/caregiver present to determine baseline cognitive functioning Area of Impairment: Problem solving;Safety/judgement;Following commands                     Memory: Decreased short-term memory Following Commands:  Follows one step commands with  increased time Safety/Judgement: Decreased awareness of safety;Decreased awareness of deficits Awareness: Intellectual Problem Solving: Slow processing;Requires verbal cues General Comments: pt reports being very hungry on entry, when asked if she ordered breakfast, she says yes, no, i don't know. PT request automatically ordered tray           General Comments General comments (skin integrity, edema, etc.): requires 3L O2 via [place] for maintaining SpO2 >90%O2, HR at rest 109bpm increasing to max noted 129 bpm with ambulation      Pertinent Vitals/Pain Pain Assessment: No/denies pain     PT Goals (current goals can now be found in the care plan section) Acute Rehab PT Goals Patient Stated Goal: go home PT Goal Formulation: With patient Time For Goal Achievement: 10/22/21 Potential to Achieve Goals: Good Progress towards PT goals: Progressing toward goals    Frequency    Min 3X/week      PT Plan Current plan remains appropriate       AM-PAC PT "6 Clicks" Mobility   Outcome Measure  Help needed turning from your back to your side while in a flat bed without using bedrails?: None Help needed moving from lying on your back to sitting on the side of a flat bed without using bedrails?: None Help needed moving to and from a bed to a chair (including a wheelchair)?: A Little Help needed standing up from a chair using your arms (e.g., wheelchair or bedside chair)?: A Little Help needed to walk in hospital room?: A Lot Help needed climbing 3-5 steps with a railing? : A Lot 6 Click Score: 18    End of Session Equipment Utilized During Treatment: Oxygen Activity Tolerance: Patient limited by fatigue Patient left: with call bell/phone within reach;in chair Nurse Communication: Mobility status PT Visit Diagnosis: Other abnormalities of gait and mobility (R26.89);Muscle weakness (generalized) (M62.81)     Time: 9741-6384 PT Time Calculation (min) (ACUTE ONLY): 19  min  Charges:  $Gait Training: 8-22 mins                     Breland Trouten B. Migdalia Dk PT, DPT Acute Rehabilitation Services Pager [phone] Office [phone]    [place] 10/12/2021, 11:19 AM

## 2021-10-12 NOTE — Care Management Important Message (Signed)
Important Message  Patient Details  Name: [name] MRN: [id number] Date of Birth: Apr 07, 1961   Medicare Important Message Given:  Yes     Shelda Altes 10/12/2021, 8:10 AM

## 2021-10-12 NOTE — Progress Notes (Signed)
Date and time results received: 10/12/21 1305 (use smartphrase ".now" to insert current time)  Test: venous blood gas Critical Value: pO2  Name of Provider Notified: Dr. Karleen Hampshire   Orders Received? Or Actions Taken?:  None.

## 2021-10-12 NOTE — TOC Transition Note (Addendum)
Transition of Care [facility]) - CM/SW Discharge Note   Patient Details  Name: [name] MRN: [id number] Date of Birth: Feb 07, 1961  Transition of Care [facility]) CM/SW Contact:  Bethena Roys, RN Phone Number: 10/12/2021, 3:05 PM   Clinical Narrative: Patient will discharge home with home health Physical and Occupational Therapy with [facility]. Patient in need of durable medical equipment (DME) portable oxygen tank for travel home. [employer] to deliver DME 02 to the room. Patient has a concentrator in the home. Grandson to provide transportation home. No further needs from Case Manager at this time.     10-12-21 1611 Grandsons arrived for transport home and states that oxygen tubing is broken at home. Case Manager called the [employer] Liaison to speak with family. [employer] will service the machine tonight. Portable tank will be delivered to the room for the patient to travel safely home.   Barriers to Discharge: No Barriers Identified   Patient Goals and CMS Choice Patient states their goals for this hospitalization and ongoing recovery are:: to return home      Discharge Plan and Services In-house Referral: NA Discharge Planning Services: CM Consult Post Acute Care Choice: Resumption of Svcs/PTA Provider, Home Health                    HH Arranged: PT, OT [facility] Agency: [place] Date [facility] Agency Contacted: 10/04/21 Time [place]: 1620 Representative spoke with at [place]: [place]   Readmission Risk Interventions Readmission Risk Prevention Plan 10/04/2021  Transportation Screening Complete  Medication Review Press photographer) Complete  PCP or Specialist appointment within 3-5 days of discharge Complete  HRI or [place] Complete  SW Recovery Care/Counseling Consult Complete  [place] Not Applicable  Some recent data might be hidden

## 2021-10-12 NOTE — Progress Notes (Signed)
[facility] Progress Note   Subjective: Very emotional today. Wants to go home. Unhappy with hospital. Next HD 10/13/2021    Objective Vitals:   10/11/21 2305 10/12/21 0522 10/12/21 0742 10/12/21 0940  BP: 121/69 131/73  (!) 153/64  Pulse: (!) 111 (!) 103    Resp: 20 20    Temp: 99.3 F (37.4 C) 98.8 F (37.1 C)    TempSrc: Oral Oral    SpO2: 95% 96% 98% 95%  Weight:  36.5 kg    Height:       Physical Exam General: Chronically ill appearing female, looks older than stated age.  Heart: S1,S2 irreg. No M/R/G Lungs: Decreased in bases otherwise CTAB. No WOB.  Abdomen: NABS, NT, ND Extremities: No LE edema.  Dialysis Access: RIJ TDC drsg intact. L AVG + T/B   Additional Objective Labs: Basic Metabolic Panel: Recent Labs  Lab 10/10/21 0103 10/11/21 0618 10/12/21 0247  NA 134* 131* 135  K 3.6 4.4 4.1  CL 96* 96* 95*  CO2 25 22 29   GLUCOSE 134* 82 88  BUN 20 39* 27*  CREATININE 2.11* 3.28* 2.29*  CALCIUM 8.5* 8.8* 8.5*  PHOS 3.0 4.7* 3.2   Liver Function Tests: Recent Labs  Lab 10/10/21 1031 10/11/21 0618 10/12/21 0247  AST 27  --   --   ALT 35  --   --   ALKPHOS 97  --   --   BILITOT 0.7  --   --   PROT 5.8*  --   --   ALBUMIN 2.3* 2.2* 1.8*   No results for input(s): LIPASE, AMYLASE in the last 168 hours. CBC: Recent Labs  Lab 10/06/21 0116 10/09/21 0105 10/11/21 0618  WBC 10.7* 7.3 6.9  NEUTROABS  --  5.9  --   HGB 9.8* 7.9* 7.6*  HCT 30.5* 24.3* 23.9*  MCV 97.8 96.8 98.4  PLT 193 225 313   Blood Culture    Component Value Date/Time   SDES FLUID PLEURAL 10/09/2021 1516   SDES FLUID PLEURAL 10/09/2021 1516   SPECREQUEST BOTTLES DRAWN AEROBIC AND ANAEROBIC 10/09/2021 1516   SPECREQUEST NONE 10/09/2021 1516   CULT  10/09/2021 1516    NO GROWTH 2 DAYS Performed at [facility], [place] [address], [place], [place] [address]    REPTSTATUS PENDING 10/09/2021 1516   REPTSTATUS 10/10/2021 FINAL 10/09/2021 1516    Cardiac  Enzymes: No results for input(s): CKTOTAL, CKMB, CKMBINDEX, TROPONINI in the last 168 hours. CBG: No results for input(s): GLUCAP in the last 168 hours. Iron Studies: No results for input(s): IRON, TIBC, TRANSFERRIN, FERRITIN in the last 72 hours. @lablastinr3 @ Studies/Results: No results found. Medications:  sodium chloride Stopped (10/06/21 1929)   cefTRIAXone (ROCEPHIN)  IV 1 g (10/11/21 1527)    (feeding supplement) PROSource Plus  30 mL Oral BID BM   amLODipine  10 mg Oral Daily   arformoterol  15 mcg Nebulization BID   vitamin C  500 mg Oral BID   atorvastatin  80 mg Oral QHS   budesonide (PULMICORT) nebulizer solution  0.25 mg Nebulization BID   calcitRIOL  0.25 mcg Oral Q M,W,F-HD   calcium acetate  667 mg Oral TID WC   Chlorhexidine Gluconate Cloth  6 each Topical Q0600   darbepoetin (ARANESP) injection - DIALYSIS  150 mcg Subcutaneous Q Wed-HD   ezetimibe  10 mg Oral Daily   feeding supplement  237 mL Oral TID BM   folic acid  1 mg Oral  Daily   guaiFENesin  600 mg Oral BID   ipratropium-albuterol  3 mL Nebulization BID   isosorbide mononitrate  60 mg Oral Daily   linaclotide  72 mcg Oral QAC breakfast   loratadine  10 mg Oral Daily   losartan  50 mg Oral Daily   megestrol  40 mg Oral Daily   multivitamin  1 tablet Oral QHS   pantoprazole  40 mg Oral Daily   venlafaxine XR  300 mg Oral Q breakfast   vitamin B-12  500 mcg Oral Daily     OP HD: MWF GO   3h 10min  40.8kg  3K/2.5 bath  Hep none  TDC/ LUA AVG done 09/14/21  - calcitriol 0.5 mcg po tiw  - mircera 75 mcg on 09/15/21     Assessment/Plan:  Acute Flash Pulmonary Edema: Resolved with HD. Continue lowering volume as tolerated. Nadir wt 35.9 kg. Per primary. Hypertensive urgency - Hypertension improved with medication adjustments and lowering volume. Increased isosorbide mononitrate to 60 mg qd and started losartan 25 mg qd and titrating to 50 mg daily.  COPD - per pulmonology. Unsure of her baseline  respiratory status but suspect she has limited reserve. R Pleural Effusion: Thoracentesis 01/09 yield 280cc, probably transudate.  ESRD - HD MWF. S/p CRRT 1/4 - 1/6 and then iHD on 1/7. Next HD 10/13/2021.  Anemia of Chronic Kidney Disease: note Hb drop.  S/p PRBC's on 10/04/21. Now on MWF schedule. HGB down to 7.9 on 10/11/21. Increased Aranesp to 150 mcg IV and given 10/11/2021.  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 Decrease VDRA. PO4 at goal. Has resumed binders. Nutrition: Albumin low. Add protein supps.    Disposition: Stable for discharge from Nephrology stand point.     Tyson Parkison H. Mallie Giambra NP-C 10/12/2021, 10:40 AM  [employer] [phone]

## 2021-10-14 ENCOUNTER — Telehealth: Payer: Self-pay | Admitting: Physician Assistant

## 2021-10-14 ENCOUNTER — Encounter: Payer: Self-pay | Admitting: Physician Assistant

## 2021-10-14 LAB — CULTURE, BODY FLUID W GRAM STAIN -BOTTLE: Culture: NO GROWTH

## 2021-10-14 NOTE — Telephone Encounter (Signed)
Transition of care contact from inpatient facility  Date of Discharge: 08/12/22 Date of Contact: 08/24/22 Method of contact: Phone  Attempted to contact patient to discuss transition of care from inpatient admission. Patient did not answer the phone. The number was not reachable on 1/13 or 1/24. Will follow up with patient at her dialysis center.   Anice Paganini, PA-C 10/14/2021, 10:45 AM  [facility] Pager: [phone]

## 2021-10-17 NOTE — Discharge Summary (Signed)
Physician Discharge Summary  [name] GQQ:[id number] DOB: 29-Aug-1961 DOA: 10/03/2021  PCP: Bernerd Limbo, MD  Admit date: 10/03/2021 Discharge date: 10/12/2021  Admitted From: Home.  Disposition:  Home.   Recommendations for Outpatient Follow-up:  Follow up with PCP in 1-2 weeks Please obtain BMP/CBC in one week   Discharge Condition:stable.  CODE STATUS:DNR Diet recommendation: Heart Healthy   Brief/Interim Summary: 61 year old with past medical history significant for ESRD on hemodialysis MWF, hypertension, hyperlipidemia, COPD, chronic hypoxic respiratory failure 2 L of oxygen who presents on 1/4 with worsening shortness of breath.  Patient was found to have hypertension urgency with flash pulmonary edema. Patient was a started on nitroglycerin drip, BiPAP admitted to ICU, she received urgent dialysis.  Her condition improved she was transferred to hospitalist service 1/7.   Of note patient had a recent hospitalization from 09/29/2021 through 10/01/2021 for dyspnea related to volume overload andpossibleCOPDexacerbation.  She has not missed any hemodialysis.    Discharge Diagnoses:  Principal Problem:   Hypertensive urgency  Acute on chronic hypoxic respiratory failure Secondary to flash pulmonary edema from acute hypertensive emergency with a component of COPD exacerbation   Patient received urgent HD and is currently requiring BiPAP. Patient is also on 2 L of nasal cannula oxygen during the day. Patient also underwent Thora centesis on 10/09/2021 yielding about 280 mL of fluid probably transudate from volume overload. So far cultures have been negative.  Patient appears to require BiPAP at night.     Hypertension Continue with Norvasc Imdur and Cozaar.       End-stage renal disease on dialysis Monday Wednesday Friday Patient has a TDC/left upper extremity AV graft Nephrology on board appreciate recommendations     Hyperlipidemia       COPD no wheezing heard on  exam today. Continue with bronchodilators as needed.       History of bipolar disorder, anxiety and chronic pain syndrome Resume home medications.     Severe protein malnutrition in the setting of chronic illness Dietary on board.      Discharge Instructions  Discharge Instructions     Ambulatory referral to Pulmonology   Complete by: As directed    Reason for referral: Asthma/COPD   Diet - low sodium heart healthy   Complete by: As directed    Increase activity slowly   Complete by: As directed    No wound care   Complete by: As directed       Allergies as of 10/12/2021       Reactions   Doxycycline Anaphylaxis, Hives   Hydralazine Shortness Of Breath, Rash   Patient reports "couldn't breath"   Aspirin Other (See Comments)   Internal bleeding- MD SAID to not take this   Hydrocodone Nausea And Vomiting   Ibuprofen Other (See Comments)   Caused internal bleeding   Tylenol [acetaminophen] Other (See Comments)   MD told the patient to not take this   Iohexol Itching, Other (See Comments)   Pt has itching nose after iv contrast injection        Medication List     TAKE these medications    albuterol 108 (90 Base) MCG/ACT inhaler Commonly known as: VENTOLIN HFA Inhale 2 puffs into the lungs every 6 (six) hours as needed for wheezing or shortness of breath (asthma).   albuterol (2.5 MG/3ML) 0.083% nebulizer solution Commonly known as: PROVENTIL Take 3 mLs (2.5 mg total) by nebulization every 2 (two) hours as needed for shortness of breath.  ALPRAZolam 1 MG tablet Commonly known as: XANAX Take 1 mg by mouth 2 (two) times daily as needed for anxiety.   amLODipine 10 MG tablet Commonly known as: NORVASC TAKE 1 TABLET BY MOUTH ONCE DAILY   ascorbic acid 500 MG tablet Commonly known as: VITAMIN C Take 1 tablet (500 mg total) by mouth 2 (two) times daily.   atorvastatin 80 MG tablet Commonly known as: LIPITOR Take 80 mg by mouth at bedtime.    azelastine 0.1 % nasal spray Commonly known as: ASTELIN Place 1 spray into both nostrils daily as needed for allergies (seasonal allergies).   CALCIUM 600 + D PO Take 1 tablet by mouth daily.   calcium acetate 667 MG capsule Commonly known as: PHOSLO Take 1 capsule (667 mg total) by mouth 3 (three) times daily with meals. What changed:  how much to take when to take this additional instructions   cetirizine 10 MG tablet Commonly known as: ZYRTEC Take 10 mg by mouth daily as needed for allergies.   Ensure Take 237 mLs by mouth 2 (two) times daily between meals.   ezetimibe 10 MG tablet Commonly known as: ZETIA TAKE 1 TABLET BY MOUTH ONCE A DAY   folic acid 1 MG tablet Commonly known as: FOLVITE Take 1 tablet (1 mg total) by mouth daily.   guaiFENesin 100 MG/5ML liquid Commonly known as: ROBITUSSIN Take 5 mLs by mouth every 4 (four) hours as needed for cough or to loosen phlegm.   isosorbide mononitrate 60 MG 24 hr tablet Commonly known as: IMDUR Take 1 tablet (60 mg total) by mouth daily. What changed:  medication strength how much to take   Linzess 72 MCG capsule Generic drug: linaclotide TAKE 1 CAPSULE BY MOUTH ONCE DAILY BEFORE BREAKFAST What changed: See the new instructions.   losartan 50 MG tablet Commonly known as: COZAAR Take 1 tablet (50 mg total) by mouth daily.   megestrol 40 MG tablet Commonly known as: MEGACE Take 40 mg by mouth daily.   mometasone-formoterol 100-5 MCG/ACT Aero Commonly known as: DULERA Take 2 puffs first thing in am and then another 2 puffs about 12 hours later. What changed:  how much to take how to take this when to take this additional instructions   multivitamin Tabs tablet Take 1 tablet by mouth at bedtime.   nitroGLYCERIN 0.4 MG SL tablet Commonly known as: NITROSTAT Place 1 tablet (0.4 mg total) under the tongue every 5 (five) minutes as needed for chest pain.   ondansetron 4 MG disintegrating  tablet Commonly known as: ZOFRAN-ODT Take 1 tablet (4 mg total) by mouth every 8 (eight) hours as needed for nausea or vomiting. What changed:  when to take this additional instructions   oxyCODONE-acetaminophen 5-325 MG tablet Commonly known as: PERCOCET/ROXICET Take 1 tablet by mouth 2 (two) times daily as needed for pain.   pantoprazole 40 MG tablet Commonly known as: PROTONIX Take 1 tablet (40 mg total) by mouth daily.   polyethylene glycol powder 17 GM/SCOOP powder Commonly known as: MiraLax Take 17 g by mouth 2 (two) times daily as needed for moderate constipation or mild constipation.   predniSONE 20 MG tablet Commonly known as: DELTASONE Take 2 tablets (40 mg total) by mouth daily with breakfast.   promethazine 25 MG tablet Commonly known as: PHENERGAN Take 25 mg by mouth every 6 (six) hours as needed for nausea or vomiting. Alternate with Ondansetron   traZODone 100 MG tablet Commonly known as: DESYREL Take 50-100 mg  by mouth at bedtime as needed for sleep.   venlafaxine XR 150 MG 24 hr capsule Commonly known as: Effexor XR Take 2 capsules (300 mg total) by mouth daily with breakfast.   vitamin B-12 500 MCG tablet Commonly known as: CYANOCOBALAMIN Take 500 mcg by mouth daily.        Follow-up Information     [facility] Follow up.   Why: Home Bipap Contact information: [place] [address] [phone]         [facility] Follow up.   Specialty: Home Health Services Why: Physical and Occupational Therapy-office to call with visit times. Contact information: [address] [place] [address] [phone]                Allergies  Allergen Reactions   Doxycycline Anaphylaxis and Hives   Hydralazine Shortness Of Breath and Rash    Patient reports "couldn't breath"   Aspirin Other (See Comments)    Internal bleeding- MD SAID to not take this   Hydrocodone Nausea And Vomiting    Ibuprofen Other (See Comments)    Caused internal bleeding   Tylenol [Acetaminophen] Other (See Comments)    MD told the patient to not take this   Iohexol Itching and Other (See Comments)    Pt has itching nose after iv contrast injection    Consultations: None.    Procedures/Studies: CT ABDOMEN PELVIS WO CONTRAST  Result Date: 09/18/2021 CLINICAL DATA:  Abdominal pain, acute, nonlocalized. Shortness of breath for 3-4 weeks with hypoxia. Missed dialysis EXAM: CT ABDOMEN AND PELVIS WITHOUT CONTRAST TECHNIQUE: Multidetector CT imaging of the abdomen and pelvis was performed following the standard protocol without IV contrast. COMPARISON:  08/16/2021 FINDINGS: Lower chest: Small bilateral pleural effusions with associated atelectasis or consolidation within the lingula and bilateral lower lobes. Mild cardiomegaly. Extensive coronary artery calcification. Trace pericardial effusion. Relative hypoattenuation of the cardiac blood pool indicative of anemia. Hepatobiliary: Unremarkable unenhanced appearance of the liver. No focal liver lesion identified. Gallbladder within normal limits. No hyperdense gallstone. No biliary dilatation. Pancreas: Grossly unremarkable, although evaluation is limited in the absence of intravenous contrast and relative paucity of intra-fat. Spleen: Normal in size without focal abnormality. Adrenals/Urinary Tract: Unremarkable adrenal glands. Advanced left renal atrophy. No renal stone or hydronephrosis. Urinary bladder unremarkable for the degree of distension. Stomach/Bowel: Evaluation limited in the absence of intravenous or enteric contrast. Stomach is within normal limits. No evidence of bowel wall thickening, distention, or inflammatory changes. Vascular/Lymphatic: Severe, age advanced atherosclerotic calcifications throughout the aortoiliac axis. Bilateral iliac stents. No abdominopelvic lymphadenopathy is evident. Reproductive: Status post hysterectomy. No adnexal masses.  Other: No free fluid. No abdominopelvic fluid collection. No pneumoperitoneum. No abdominal wall hernia. Musculoskeletal: Diffuse anasarca.  No acute osseous abnormality. IMPRESSION: 1. Small bilateral pleural effusions with associated atelectasis or consolidation within the lingula and bilateral lower lobes. 2. No acute findings identified within the abdomen or pelvis. 3. Diffuse anasarca. 4. Severe, age advanced atherosclerotic calcifications throughout the aortoiliac axis (ICD10-I70.0). Electronically Signed   By: Davina Poke D.O.   On: 09/18/2021 14:16   DG Chest 1 View  Result Date: 10/09/2021 CLINICAL DATA:  Status post right thoracentesis. EXAM: CHEST  1 VIEW COMPARISON:  October 08, 2021. FINDINGS: No pneumothorax status post right thoracentesis. Minimal right pleural effusion is noted. IMPRESSION: No pneumothorax status post right thoracentesis. Electronically Signed   By: Marijo Conception M.D.   On: 10/09/2021 15:05  DG Chest 2 View  Result Date: 10/08/2021 CLINICAL DATA:  Respiratory distress.  Cough. EXAM: CHEST - 2 VIEW COMPARISON:  10/04/2021, CT 10/01/2021 FINDINGS: Right-sided dialysis catheter remains in place. Grossly unchanged cardiomegaly. Worsening right but slight improvement in left pleural effusion, as well as associated bibasilar opacities. Diffuse interstitial opacities suspicious for pulmonary edema, mildly improved. No pneumothorax. IMPRESSION: 1. Worsening right but slight improvement in left pleural effusion, as well as associated basilar opacities. 2. Stable cardiomegaly. Mildly improved interstitial opacities likely improving edema. Electronically Signed   By: Keith Rake M.D.   On: 10/08/2021 17:37   DG Chest 2 View  Result Date: 09/18/2021 CLINICAL DATA:  Shortness of breath EXAM: CHEST - 2 VIEW COMPARISON:  Chest radiograph dated August 18, 2021. FINDINGS: The heart is enlarged. Small bilateral pleural effusions. Bibasilar opacities which may represent  pulmonary edema or infiltrate. Right IJ access double-lumen catheter is noted. The osseous structures are unremarkable. IMPRESSION: Stable cardiomegaly with small bilateral pleural effusions. Bibasilar opacities which may represent atelectasis or infiltrate. Electronically Signed   By: Keane Police D.O.   On: 09/18/2021 14:00   CT Angio Chest Pulmonary Embolism (PE) W or WO Contrast  Result Date: 10/01/2021 CLINICAL DATA:  61 year old female with history of positive D-dimer. Evaluate for pulmonary embolism. EXAM: CT ANGIOGRAPHY CHEST WITH CONTRAST TECHNIQUE: Multidetector CT imaging of the chest was performed using the standard protocol during bolus administration of intravenous contrast. Multiplanar CT image reconstructions and MIPs were obtained to evaluate the vascular anatomy. CONTRAST:  38mL OMNIPAQUE IOHEXOL 350 MG/ML SOLN COMPARISON:  No priors. FINDINGS: Cardiovascular: Study is limited by considerable patient respiratory motion. With these limitations in mind, there is no central, lobar or segmental sized pulmonary embolism. Smaller subsegmental sized emboli cannot be completely excluded. Heart size is mildly enlarged. There is no significant pericardial fluid, thickening or pericardial calcification. There is aortic atherosclerosis, as well as atherosclerosis of the great vessels of the mediastinum and the coronary arteries, including calcified atherosclerotic plaque in the left main, left anterior descending and right coronary arteries. Calcifications of the mitral annulus. Mediastinum/Nodes: No pathologically enlarged mediastinal or hilar lymph nodes. Esophagus is unremarkable in appearance. No axillary lymphadenopathy. Lungs/Pleura: Small bilateral pleural effusions lying dependently with some areas of passive subsegmental atelectasis in the lower lobes of the lungs bilaterally. No acute consolidative airspace disease. Mild diffuse bronchial wall thickening with mild centrilobular and paraseptal  emphysema. Upper Abdomen: Atherosclerotic calcifications are noted in the abdominal aorta. Musculoskeletal: Old healed fracture of the right mid clavicle with fibrous union and chronic posttraumatic deformity. There are no aggressive appearing lytic or blastic lesions noted in the visualized portions of the skeleton. Review of the MIP images confirms the above findings. IMPRESSION: 1. Mildly limited study demonstrating no evidence of clinically significant central, lobar or segmental sized pulmonary embolism. 2. Small bilateral pleural effusions with some passive subsegmental atelectasis in the lower lobes of the lungs bilaterally. 3. Cardiomegaly. 4. Aortic atherosclerosis, in addition to left main and 2 vessel coronary artery disease. Please note that although the presence of coronary artery calcium documents the presence of coronary artery disease, the severity of this disease and any potential stenosis cannot be assessed on this non-gated CT examination. Assessment for potential risk factor modification, dietary therapy or pharmacologic therapy may be warranted, if clinically indicated. 5. Mild diffuse bronchial wall thickening with mild centrilobular and paraseptal emphysema; imaging findings suggestive of underlying COPD. Aortic Atherosclerosis (ICD10-I70.0) and Emphysema (ICD10-J43.9). Electronically Signed   By: Quillian Quince  Entrikin M.D.   On: 10/01/2021 13:39   DG Chest Port 1 View  Result Date: 10/04/2021 CLINICAL DATA:  Shortness of breath.  Respiratory distress. EXAM: PORTABLE CHEST 1 VIEW COMPARISON:  Chest x-ray 10/06/2021. FINDINGS: Dialysis catheter in stable position. Stable cardiomegaly. Diffuse bilateral pulmonary infiltrates/edema again noted. Small bilateral pleural effusions again noted. Chest is unchanged from prior exam. No pneumothorax. IMPRESSION: 1.  Dialysis catheter in stable position. 2. Stable cardiomegaly. Diffuse bilateral pulmonary infiltrates/edema again noted. Small bilateral  pleural effusions again noted. No interim change from prior exam. Electronically Signed   By: Marcello Moores  Register M.D.   On: 10/04/2021 06:39   DG Chest Portable 1 View  Result Date: 10/03/2021 CLINICAL DATA:  Dyspnea, COPD exacerbation EXAM: PORTABLE CHEST 1 VIEW COMPARISON:  09/29/2021 FINDINGS: Single frontal view of the chest demonstrates stable right internal jugular dialysis catheter. Cardiac silhouette is enlarged but stable. Increased central vascular congestion, as well as progressive interstitial and ground-glass opacities, bibasilar consolidation, and bilateral pleural effusions. No pneumothorax. IMPRESSION: 1. Findings most consistent with worsening volume status and progressive pulmonary edema. Electronically Signed   By: Randa Ngo M.D.   On: 10/03/2021 20:40   DG Chest Port 1 View  Result Date: 09/29/2021 CLINICAL DATA:  chest pain, SOB EXAM: PORTABLE CHEST 1 VIEW COMPARISON:  09/18/2021 chest radiograph. FINDINGS: Right internal jugular central venous catheter terminates over the cavoatrial junction. Stable cardiomediastinal silhouette with mild cardiomegaly. No pneumothorax. Small bilateral pleural effusions, left greater than right, stable. Cephalization of the pulmonary vasculature without overt pulmonary edema. Patchy bibasilar lung consolidation with associated volume loss, most prominent at the medial lung bases, unchanged. IMPRESSION: 1. Stable cardiomegaly without overt pulmonary edema. 2. Stable small bilateral pleural effusions, left greater than right. 3. Stable patchy bibasilar lung consolidation with associated volume loss, favor atelectasis, difficult to exclude a component of pneumonia. Electronically Signed   By: Ilona Sorrel M.D.   On: 09/29/2021 08:23   IR THORACENTESIS ASP PLEURAL SPACE W/IMG GUIDE  Result Date: 10/09/2021 INDICATION: Patient history of end-stage renal disease on hemodialysis, COPD. Admitted for respiratory failure found to have flash pulmonary edema  with small right-sided pleural effusion. Request is for therapeutic and diagnostic thoracentesis. EXAM: ULTRASOUND GUIDED DIAGNOSTIC AND THERAPEUTIC RIGHT-SIDED THORACENTESIS MEDICATIONS: Lidocaine 1% 10 mL COMPLICATIONS: None immediate. PROCEDURE: An ultrasound guided thoracentesis was thoroughly discussed with the patient and questions answered. The benefits, risks, alternatives and complications were also discussed. The patient understands and wishes to proceed with the procedure. Written consent was obtained. Ultrasound was performed to localize and mark an adequate pocket of fluid in the right chest. The area was then prepped and draped in the normal sterile fashion. 1% Lidocaine was used for local anesthesia. Under ultrasound guidance a 19 gauge, 7-cm, Yueh catheter was introduced. Thoracentesis was performed. The catheter was removed and a dressing applied. FINDINGS: A total of approximately 280 mL of straw-colored fluid was removed. Samples were sent to the laboratory as requested by the clinical team. IMPRESSION: Successful ultrasound guided diagnostic and therapeutic right-sided thoracentesis yielding 280 mL of pleural fluid. Read by: Rushie Nyhan, NP Electronically Signed   By: Sandi Mariscal M.D.   On: 10/09/2021 15:18      Subjective:  No new complaints.  Discharge Exam: Vitals:   10/12/21 0742 10/12/21 0940  BP:  (!) 153/64  Pulse:    Resp:    Temp:    SpO2: 98% 95%   Vitals:   10/11/21 2305 10/12/21 0522 10/12/21 0742 10/12/21  0940  BP: 121/69 131/73  (!) 153/64  Pulse: (!) 111 (!) 103    Resp: 20 20    Temp: 99.3 F (37.4 C) 98.8 F (37.1 C)    TempSrc: Oral Oral    SpO2: 95% 96% 98% 95%  Weight:  36.5 kg    Height:        General: Pt is alert, awake, not in acute distress Cardiovascular: RRR, S1/S2 +, no rubs, no gallops Respiratory: CTA bilaterally, no wheezing, no rhonchi Abdominal: Soft, NT, ND, bowel sounds + Extremities: no edema, no cyanosis    The  results of significant diagnostics from this hospitalization (including imaging, microbiology, ancillary and laboratory) are listed below for reference.     Microbiology: Recent Results (from the past 240 hour(s))  Culture, body fluid w Gram Stain-bottle     Status: None   Collection Time: 10/09/21  3:16 PM   Specimen: Fluid  Result Value Ref Range Status   Specimen Description FLUID PLEURAL  Final   Special Requests BOTTLES DRAWN AEROBIC AND ANAEROBIC  Final   Culture   Final    NO GROWTH 5 DAYS Performed at [facility], [place] [address] [place], [place] [address]    Report Status 10/14/2021 FINAL  Final  Gram stain     Status: None   Collection Time: 10/09/21  3:16 PM   Specimen: Fluid  Result Value Ref Range Status   Specimen Description FLUID PLEURAL  Final   Special Requests NONE  Final   Gram Stain   Final    FEW WBC PRESENT,BOTH PMN AND MONONUCLEAR NO ORGANISMS SEEN Performed at [facility], [address] [place], [place] [address]    Report Status 10/10/2021 FINAL  Final     Labs: BNP (last 3 results) Recent Labs    08/16/21 0740  BNP 814.4*   Basic Metabolic Panel: Recent Labs  Lab 10/11/21 0618 10/12/21 0247  NA 131* 135  K 4.4 4.1  CL 96* 95*  CO2 22 29  GLUCOSE 82 88  BUN 39* 27*  CREATININE 3.28* 2.29*  CALCIUM 8.8* 8.5*  PHOS 4.7* 3.2   Liver Function Tests: Recent Labs  Lab 10/11/21 0618 10/12/21 0247  ALBUMIN 2.2* 1.8*   No results for input(s): LIPASE, AMYLASE in the last 168 hours. No results for input(s): AMMONIA in the last 168 hours. CBC: Recent Labs  Lab 10/11/21 0618  WBC 6.9  HGB 7.6*  HCT 23.9*  MCV 98.4  PLT 313   Cardiac Enzymes: No results for input(s): CKTOTAL, CKMB, CKMBINDEX, TROPONINI in the last 168 hours. BNP: Invalid input(s): POCBNP CBG: No results for input(s): GLUCAP in the last 168 hours. D-Dimer No results for input(s): DDIMER in the last 72 hours. Hgb A1c No results for input(s):  HGBA1C in the last 72 hours. Lipid Profile No results for input(s): CHOL, HDL, LDLCALC, TRIG, CHOLHDL, LDLDIRECT in the last 72 hours. Thyroid function studies No results for input(s): TSH, T4TOTAL, T3FREE, THYROIDAB in the last 72 hours.  Invalid input(s): FREET3 Anemia work up No results for input(s): VITAMINB12, FOLATE, FERRITIN, TIBC, IRON, RETICCTPCT in the last 72 hours. Urinalysis    Component Value Date/Time   COLORURINE YELLOW 08/20/2021 0140   APPEARANCEUR CLEAR 08/20/2021 0140   APPEARANCEUR Cloudy (A) 03/22/2021 1432   LABSPEC 1.012 08/20/2021 0140   LABSPEC 1.016 06/07/2012 1350   PHURINE 5.0 08/20/2021 0140   GLUCOSEU 50 (A) 08/20/2021 0140   GLUCOSEU Negative 06/07/2012 1350  HGBUR SMALL (A) 08/20/2021 0140   BILIRUBINUR NEGATIVE 08/20/2021 0140   BILIRUBINUR Negative 03/22/2021 1432   BILIRUBINUR Negative 06/07/2012 1350   KETONESUR NEGATIVE 08/20/2021 0140   PROTEINUR 100 (A) 08/20/2021 0140   UROBILINOGEN 0.2 06/28/2014 1518   NITRITE NEGATIVE 08/20/2021 0140   LEUKOCYTESUR NEGATIVE 08/20/2021 0140   LEUKOCYTESUR Negative 06/07/2012 1350   Sepsis Labs Invalid input(s): PROCALCITONIN,  WBC,  LACTICIDVEN Microbiology Recent Results (from the past 240 hour(s))  Culture, body fluid w Gram Stain-bottle     Status: None   Collection Time: 10/09/21  3:16 PM   Specimen: Fluid  Result Value Ref Range Status   Specimen Description FLUID PLEURAL  Final   Special Requests BOTTLES DRAWN AEROBIC AND ANAEROBIC  Final   Culture   Final    NO GROWTH 5 DAYS Performed at [facility], [place] [address], [place], [place] [address]    Report Status 10/14/2021 FINAL  Final  Gram stain     Status: None   Collection Time: 10/09/21  3:16 PM   Specimen: Fluid  Result Value Ref Range Status   Specimen Description FLUID PLEURAL  Final   Special Requests NONE  Final   Gram Stain   Final    FEW WBC PRESENT,BOTH PMN AND MONONUCLEAR NO ORGANISMS SEEN Performed at [facility], [address], [place], [place] [address]    Report Status 10/10/2021 FINAL  Final     Time coordinating discharge: 38 minutes.   SIGNED:   Hosie Poisson, MD  [facility]

## 2021-10-18 ENCOUNTER — Other Ambulatory Visit: Payer: Self-pay

## 2021-10-18 ENCOUNTER — Inpatient Hospital Stay (HOSPITAL_COMMUNITY)
Admission: EM | Admit: 2021-10-18 | Discharge: 2021-10-21 | DRG: 377 | Disposition: A | Discharge status: Hospice | Payer: Medicare Other | Attending: Family Medicine | Admitting: Family Medicine

## 2021-10-18 ENCOUNTER — Encounter (HOSPITAL_COMMUNITY): Payer: Self-pay

## 2021-10-18 ENCOUNTER — Encounter (HOSPITAL_COMMUNITY): Payer: Self-pay | Admitting: Family Medicine

## 2021-10-18 DIAGNOSIS — K5520 Angiodysplasia of colon without hemorrhage: Secondary | ICD-10-CM | POA: Diagnosis present

## 2021-10-18 DIAGNOSIS — K5521 Angiodysplasia of colon with hemorrhage: Secondary | ICD-10-CM | POA: Diagnosis present

## 2021-10-18 DIAGNOSIS — Z7951 Long term (current) use of inhaled steroids: Secondary | ICD-10-CM | POA: Diagnosis not present

## 2021-10-18 DIAGNOSIS — Z886 Allergy status to analgesic agent status: Secondary | ICD-10-CM

## 2021-10-18 DIAGNOSIS — F319 Bipolar disorder, unspecified: Secondary | ICD-10-CM | POA: Diagnosis present

## 2021-10-18 DIAGNOSIS — G8929 Other chronic pain: Secondary | ICD-10-CM | POA: Diagnosis present

## 2021-10-18 DIAGNOSIS — D62 Acute posthemorrhagic anemia: Secondary | ICD-10-CM | POA: Diagnosis present

## 2021-10-18 DIAGNOSIS — Z79899 Other long term (current) drug therapy: Secondary | ICD-10-CM

## 2021-10-18 DIAGNOSIS — M898X9 Other specified disorders of bone, unspecified site: Secondary | ICD-10-CM | POA: Diagnosis present

## 2021-10-18 DIAGNOSIS — J449 Chronic obstructive pulmonary disease, unspecified: Secondary | ICD-10-CM | POA: Diagnosis present

## 2021-10-18 DIAGNOSIS — F419 Anxiety disorder, unspecified: Secondary | ICD-10-CM | POA: Diagnosis present

## 2021-10-18 DIAGNOSIS — D649 Anemia, unspecified: Secondary | ICD-10-CM | POA: Diagnosis not present

## 2021-10-18 DIAGNOSIS — I132 Hypertensive heart and chronic kidney disease with heart failure and with stage 5 chronic kidney disease, or end stage renal disease: Secondary | ICD-10-CM | POA: Diagnosis present

## 2021-10-18 DIAGNOSIS — Z885 Allergy status to narcotic agent status: Secondary | ICD-10-CM | POA: Diagnosis not present

## 2021-10-18 DIAGNOSIS — Z91041 Radiographic dye allergy status: Secondary | ICD-10-CM

## 2021-10-18 DIAGNOSIS — Z681 Body mass index (BMI) 19 or less, adult: Secondary | ICD-10-CM | POA: Diagnosis not present

## 2021-10-18 DIAGNOSIS — D631 Anemia in chronic kidney disease: Secondary | ICD-10-CM | POA: Diagnosis present

## 2021-10-18 DIAGNOSIS — Z515 Encounter for palliative care: Secondary | ICD-10-CM

## 2021-10-18 DIAGNOSIS — Z992 Dependence on renal dialysis: Secondary | ICD-10-CM

## 2021-10-18 DIAGNOSIS — N186 End stage renal disease: Secondary | ICD-10-CM | POA: Diagnosis present

## 2021-10-18 DIAGNOSIS — K921 Melena: Secondary | ICD-10-CM | POA: Diagnosis present

## 2021-10-18 DIAGNOSIS — E43 Unspecified severe protein-calorie malnutrition: Secondary | ICD-10-CM | POA: Diagnosis present

## 2021-10-18 DIAGNOSIS — K551 Chronic vascular disorders of intestine: Secondary | ICD-10-CM | POA: Diagnosis present

## 2021-10-18 DIAGNOSIS — Z9981 Dependence on supplemental oxygen: Secondary | ICD-10-CM | POA: Diagnosis not present

## 2021-10-18 DIAGNOSIS — Z66 Do not resuscitate: Secondary | ICD-10-CM | POA: Diagnosis not present

## 2021-10-18 DIAGNOSIS — G894 Chronic pain syndrome: Secondary | ICD-10-CM | POA: Diagnosis present

## 2021-10-18 DIAGNOSIS — I2510 Atherosclerotic heart disease of native coronary artery without angina pectoris: Secondary | ICD-10-CM | POA: Diagnosis present

## 2021-10-18 DIAGNOSIS — K922 Gastrointestinal hemorrhage, unspecified: Principal | ICD-10-CM | POA: Diagnosis present

## 2021-10-18 DIAGNOSIS — Z881 Allergy status to other antibiotic agents status: Secondary | ICD-10-CM | POA: Diagnosis not present

## 2021-10-18 DIAGNOSIS — Z7952 Long term (current) use of systemic steroids: Secondary | ICD-10-CM | POA: Diagnosis not present

## 2021-10-18 DIAGNOSIS — Z8541 Personal history of malignant neoplasm of cervix uteri: Secondary | ICD-10-CM

## 2021-10-18 DIAGNOSIS — R64 Cachexia: Secondary | ICD-10-CM | POA: Diagnosis present

## 2021-10-18 LAB — I-STAT CHEM 8, ED
BUN: 33 mg/dL — ABNORMAL HIGH (ref 6–20)
Calcium, Ion: 1.11 mmol/L — ABNORMAL LOW (ref 1.15–1.40)
Chloride: 95 mmol/L — ABNORMAL LOW (ref 98–111)
Creatinine, Ser: 3.1 mg/dL — ABNORMAL HIGH (ref 0.44–1.00)
Glucose, Bld: 99 mg/dL (ref 70–99)
HCT: 15 % — ABNORMAL LOW (ref 36.0–46.0)
Hemoglobin: 5.1 g/dL — CL (ref 12.0–15.0)
Potassium: 4.1 mmol/L (ref 3.5–5.1)
Sodium: 134 mmol/L — ABNORMAL LOW (ref 135–145)
TCO2: 27 mmol/L (ref 22–32)

## 2021-10-18 LAB — CBC WITH DIFFERENTIAL/PLATELET
Abs Immature Granulocytes: 0.02 10*3/uL (ref 0.00–0.07)
Basophils Absolute: 0 10*3/uL (ref 0.0–0.1)
Basophils Relative: 0 %
Eosinophils Absolute: 0.2 10*3/uL (ref 0.0–0.5)
Eosinophils Relative: 2 %
HCT: 14.4 % — ABNORMAL LOW (ref 36.0–46.0)
Hemoglobin: 4.4 g/dL — CL (ref 12.0–15.0)
Immature Granulocytes: 0 %
Lymphocytes Relative: 9 %
Lymphs Abs: 0.8 10*3/uL (ref 0.7–4.0)
MCH: 31.9 pg (ref 26.0–34.0)
MCHC: 30.6 g/dL (ref 30.0–36.0)
MCV: 104.3 fL — ABNORMAL HIGH (ref 80.0–100.0)
Monocytes Absolute: 0.5 10*3/uL (ref 0.1–1.0)
Monocytes Relative: 6 %
Neutro Abs: 7.7 10*3/uL (ref 1.7–7.7)
Neutrophils Relative %: 83 %
Platelets: 381 10*3/uL (ref 150–400)
RBC: 1.38 MIL/uL — ABNORMAL LOW (ref 3.87–5.11)
RDW: 19.7 % — ABNORMAL HIGH (ref 11.5–15.5)
WBC: 9.3 10*3/uL (ref 4.0–10.5)
nRBC: 0 % (ref 0.0–0.2)

## 2021-10-18 LAB — COMPREHENSIVE METABOLIC PANEL
ALT: 27 U/L (ref 0–44)
AST: 28 U/L (ref 15–41)
Albumin: 2.3 g/dL — ABNORMAL LOW (ref 3.5–5.0)
Alkaline Phosphatase: 80 U/L (ref 38–126)
Anion gap: 10 (ref 5–15)
BUN: 34 mg/dL — ABNORMAL HIGH (ref 6–20)
CO2: 27 mmol/L (ref 22–32)
Calcium: 7.9 mg/dL — ABNORMAL LOW (ref 8.9–10.3)
Chloride: 98 mmol/L (ref 98–111)
Creatinine, Ser: 2.91 mg/dL — ABNORMAL HIGH (ref 0.44–1.00)
GFR, Estimated: 18 mL/min — ABNORMAL LOW (ref >60)
Glucose, Bld: 101 mg/dL — ABNORMAL HIGH (ref 70–99)
Potassium: 4.1 mmol/L (ref 3.5–5.1)
Sodium: 135 mmol/L (ref 135–145)
Total Bilirubin: 0.2 mg/dL — ABNORMAL LOW (ref 0.3–1.2)
Total Protein: 5.1 g/dL — ABNORMAL LOW (ref 6.5–8.1)

## 2021-10-18 LAB — HEPATITIS B SURFACE ANTIGEN: Hepatitis B Surface Ag: NONREACTIVE

## 2021-10-18 LAB — HEMOGLOBIN AND HEMATOCRIT, BLOOD
HCT: 27.4 % — ABNORMAL LOW (ref 36.0–46.0)
Hemoglobin: 9.4 g/dL — ABNORMAL LOW (ref 12.0–15.0)

## 2021-10-18 LAB — PREPARE RBC (CROSSMATCH)

## 2021-10-18 LAB — HEPATITIS B SURFACE ANTIBODY,QUALITATIVE: Hep B S Ab: NONREACTIVE

## 2021-10-18 MED ORDER — SODIUM CHLORIDE 0.9 % IV SOLN
100.0000 mL | INTRAVENOUS | Status: DC | PRN
Start: 2021-10-18 — End: 2021-10-19

## 2021-10-18 MED ORDER — HEPARIN SODIUM (PORCINE) 1000 UNIT/ML DIALYSIS
1000.0000 [IU] | INTRAMUSCULAR | Status: DC | PRN
  Filled 2021-10-18: qty 1

## 2021-10-18 MED ORDER — SODIUM CHLORIDE 0.9% FLUSH
3.0000 mL | INTRAVENOUS | Status: DC | PRN
Start: 2021-10-18 — End: 2021-10-21

## 2021-10-18 MED ORDER — SODIUM CHLORIDE 0.9 % IV SOLN
10.0000 mL/h | Freq: Once | INTRAVENOUS | Status: AC
  Administered 2021-10-18: 10 mL/h via INTRAVENOUS

## 2021-10-18 MED ORDER — SODIUM CHLORIDE 0.9% FLUSH
3.0000 mL | Freq: Two times a day (BID) | INTRAVENOUS | Status: DC
  Administered 2021-10-18 – 2021-10-21 (×6): 3 mL via INTRAVENOUS

## 2021-10-18 MED ORDER — LIDOCAINE-PRILOCAINE 2.5-2.5 % EX CREA
1.0000 "application " | TOPICAL_CREAM | CUTANEOUS | Status: DC | PRN
  Filled 2021-10-18: qty 5

## 2021-10-18 MED ORDER — CHLORHEXIDINE GLUCONATE CLOTH 2 % EX PADS: 6.0000 | MEDICATED_PAD | Freq: Every day | CUTANEOUS | Status: DC

## 2021-10-18 MED ORDER — MEGESTROL ACETATE 40 MG PO TABS
40.0000 mg | ORAL_TABLET | Freq: Every day | ORAL | Status: DC
  Administered 2021-10-19 – 2021-10-21 (×3): 40 mg via ORAL
  Filled 2021-10-18 (×5): qty 1

## 2021-10-18 MED ORDER — ALPRAZOLAM 0.5 MG PO TABS
1.0000 mg | ORAL_TABLET | Freq: Two times a day (BID) | ORAL | Status: DC | PRN
  Administered 2021-10-18 – 2021-10-21 (×4): 1 mg via ORAL
  Filled 2021-10-18: qty 2
  Filled 2021-10-18: qty 4
  Filled 2021-10-18 (×2): qty 2

## 2021-10-18 MED ORDER — VENLAFAXINE HCL ER 75 MG PO CP24
300.0000 mg | ORAL_CAPSULE | Freq: Every day | ORAL | Status: DC
  Administered 2021-10-19 – 2021-10-21 (×3): 300 mg via ORAL
  Filled 2021-10-18: qty 2
  Filled 2021-10-18 (×3): qty 4

## 2021-10-18 MED ORDER — MOMETASONE FURO-FORMOTEROL FUM 100-5 MCG/ACT IN AERO
2.0000 | INHALATION_SPRAY | Freq: Two times a day (BID) | RESPIRATORY_TRACT | Status: DC
  Administered 2021-10-18 – 2021-10-21 (×6): 2 via RESPIRATORY_TRACT
  Filled 2021-10-18 (×2): qty 8.8

## 2021-10-18 MED ORDER — OXYCODONE-ACETAMINOPHEN 5-325 MG PO TABS
1.0000 | ORAL_TABLET | Freq: Two times a day (BID) | ORAL | Status: DC | PRN
Start: 2021-10-18 — End: 2021-10-21
  Administered 2021-10-18 – 2021-10-21 (×4): 1 via ORAL
  Filled 2021-10-18 (×4): qty 1

## 2021-10-18 MED ORDER — HYDROMORPHONE HCL 1 MG/ML IJ SOLN
0.5000 mg | Freq: Once | INTRAMUSCULAR | Status: AC
  Administered 2021-10-18: 0.5 mg via INTRAVENOUS
  Filled 2021-10-18: qty 1

## 2021-10-18 MED ORDER — PENTAFLUOROPROP-TETRAFLUOROETH EX AERO: 1.0000 "application " | INHALATION_SPRAY | CUTANEOUS | Status: DC | PRN

## 2021-10-18 MED ORDER — PANTOPRAZOLE SODIUM 40 MG PO TBEC
40.0000 mg | DELAYED_RELEASE_TABLET | Freq: Every day | ORAL | Status: DC
  Administered 2021-10-19 – 2021-10-21 (×3): 40 mg via ORAL
  Filled 2021-10-18 (×3): qty 1

## 2021-10-18 MED ORDER — TRAZODONE HCL 50 MG PO TABS
50.0000 mg | ORAL_TABLET | Freq: Every evening | ORAL | Status: DC | PRN
  Administered 2021-10-19: 100 mg via ORAL
  Administered 2021-10-20: 50 mg via ORAL
  Filled 2021-10-18 (×2): qty 2

## 2021-10-18 MED ORDER — ALBUTEROL SULFATE (2.5 MG/3ML) 0.083% IN NEBU: 2.5000 mg | INHALATION_SOLUTION | RESPIRATORY_TRACT | Status: DC | PRN

## 2021-10-18 MED ORDER — ALTEPLASE 2 MG IJ SOLR: 2.0000 mg | Freq: Once | INTRAMUSCULAR | Status: DC | PRN

## 2021-10-18 MED ORDER — AMLODIPINE BESYLATE 10 MG PO TABS
10.0000 mg | ORAL_TABLET | Freq: Every day | ORAL | Status: DC
Start: 2021-10-18 — End: 2021-10-21
  Administered 2021-10-19 – 2021-10-21 (×3): 10 mg via ORAL
  Filled 2021-10-18 (×3): qty 1

## 2021-10-18 MED ORDER — SODIUM CHLORIDE 0.9 % IV SOLN: 250.0000 mL | INTRAVENOUS | Status: DC | PRN

## 2021-10-18 MED ORDER — LIDOCAINE HCL (PF) 1 % IJ SOLN: 5.0000 mL | INTRAMUSCULAR | Status: DC | PRN

## 2021-10-18 NOTE — ED Provider Triage Note (Signed)
Emergency Medicine Provider Triage Evaluation Note  [name] , a 61 y.o. female  was evaluated in triage.  Pt complains of rectal bleeding, low hgb. Pt has had intermittent rectal bleeding x5 yrs. Restarted bleeding on Monday, 2 days ago. Blood is bright red. Has associated lower abd pain. GI doctor is Dr. Nicholaus Corolla. ESRD patient, told most recent hgb is 5 and to come to the ER  Review of Systems  Positive: Abd pain, rectal bleeding, low hgb Negative: fever  Physical Exam  BP 124/60 (BP Location: Left Arm)    Pulse 89    Temp 98.2 F (36.8 C) (Oral)    Resp 18    SpO2 100%  Gen:   Awake, no distress   Resp:  Normal effort  MSK:   Moves extremities without difficulty  Other:  Ttp of lower abd bilaterally  Medical Decision Making  Medically screening exam initiated at 9:28 AM.  Appropriate orders placed.  [name] was informed that the remainder of the evaluation will be completed by another provider, this initial triage assessment does not replace that evaluation, and the importance of remaining in the ED until their evaluation is complete.  labs   Franchot [place], [place] 10/18/21 [id number]

## 2021-10-18 NOTE — ED Triage Notes (Signed)
Pt. Stated, I was sent here by dialysis cause my HGB is a 5. Im suppose to have dialysis today but they told me to come here. Im also having some increased stomach discomfort,

## 2021-10-18 NOTE — Progress Notes (Signed)
Patient's 2nd unit of blood was completed at [facility]. Vitals taken stable. No Blood rxn observed.

## 2021-10-18 NOTE — ED Notes (Signed)
Pt family member came to nurse's station reporting pt in 10/10 pain, states 'I think she is going to crawl out of that bed and start ripping stuff up." Earlier delay in medication d/t pharmacy verification. Pt has been asking for "dilaula medicine" since arrival, has now received two doses of pain medicine and PO xanax. Pt O2 sat 90% on 2L after dilaudid admin, titrated to 3LPM and encouraged deep breathing. Monitoring closely, pt drowsy but rouses easily. No distress noted. Blood continues to infuse.

## 2021-10-18 NOTE — Consult Note (Addendum)
[facility] Consult: 10:41 AM 10/18/2021  LOS: 0 days    Referring Provider: Dr Vanita Panda in ED  Primary Care Physician:  Bernerd Limbo, MD Primary Gastroenterologist:  Dr. Ardis Hughs.      Reason for Consultation:  Hgb 4.4.  Hematochezia.   HPI: [name] is a 61 y.o. female.  PMH ESRD, HD MWF since 08/2021.  CAD, MI 2015.  Aortic stenosis.  COPD on home oxygen.  Hypothyroidism.   Bipolar d/O.   Peripheral artery disease, mesenteric ischemia, s/p SMA, aortic, bilateral common iliac stenting 11/2020.  Plavix, ASA stopped due to chronic GI blood loss and anemia requiring transfusions.  Hx colon obstruction, colon resection 2008.  Diverticulitis.  Linzess for chronic constipation.  Chronic abd pain and nausea.   Colon polyps (HP and SSA).  GERD.  Multiple episodes of hematochezia requiring inpatient admission attributed to colonic and SB AVMs.  Most recent colon/endoscopies as follows: 03/2021 colonoscopy for polyp surveillance, history piecemeal resection of SSP (08/2020), chronic abdominal pain/severe vasculopathy, colonic AVMs ((08/2020).  Dr. Ardis Hughs resected a 8 mm sigmoid polyp.  Noted left colon tics. 06/2021 SBE: For hematochezia, melena, anemia.  Nonobstructing Schatzki ring.  3 cm HH.  Stomach, duodenum, proximal jejunum normal.  Tattoo placed at distal extent of exam.  Dr. Rush Landmark suspected bleeding could have been diverticular versus ischemic in origin. 08/2021 SBE/VCE placement: Normal esophagus, stomach, duodenum, examined jejunum. 08/18/2021 VCE: No bleeding or lesions suspicious for source of bleeding in small bowel.  Erythema in colon, ?  Erosion vs polyp.  No blood in colon or small bowel   08/2021 inpt GI consult for Hgb 7.5 (PRBC x1), black stool, stable chronic abd pain/nausea (for > 9 years).   This during admission for flare COPD.  LFTs elevated with [place] CT showing normal liver, normal biliary tree, viral/autoimmune/metabollic liver dz markers and hepatitis serologies negative.   Dr Rush Landmark chose not to pursue additional upper/lower endoscopy.   2 admissions after this in early January for COPD, hypertensive urgency/flash pulm edema.  Thoracentesis  on 1/9.  Notes of severe prot cal malnutrition.  Pall care consult of 12/31 w plans to continue HD but DNR status established.    Pt passing multiple episodes of bloody/clotted stool starting 2 days ago.  No nausea or vomiting.  Says her appetite is good in general.  Stable generalized abdominal pain.  Pt sent from outpatient dialysis unit due to anemia.  Did not undergo dialysis session.  After arrival in the ED has developed severe left abdominal pain. Hgb 4.4 to 5.1.   Was 7.6 on  1/11, ten d ago.   Potassium normal.  Past Medical History:  Diagnosis Date   Aortic stenosis    abominal aorta, s/p distal aortic stent 09/28/18, 12/01/20   Bipolar 1 disorder (HCC)    Blood transfusion without reported diagnosis    Carpal tunnel syndrome    Cataract    forming   Cervical cancer ([place]) 1985   CHF (congestive heart failure) ([place])    Chronic kidney disease (CKD), stage V ([place])  COPD (chronic obstructive pulmonary disease) ([place]) 2000   Coronary artery disease    Diverticulitis 2008   GERD (gastroesophageal reflux disease)    Glaucoma    HLD (hyperlipidemia) 2013   Hypertension 2013   Hypovitaminosis D    IBS (irritable bowel syndrome) 2008   PAD (peripheral artery disease) (HCC)    Pancreatitis 10/2011   RLS (restless legs syndrome)    Schizophrenia (HCC)    Skin cancer    Small bowel obstruction ([place]) 2008   Thyroid disease     Past Surgical History:  Procedure Laterality Date   ABDOMINAL ANGIOGRAM N/A 09/07/2014   Procedure: ABDOMINAL ANGIOGRAM;  Surgeon: Laverda Page, MD;  Location: [facility] CATH LAB;  Service:  Cardiovascular;  Laterality: N/A;   ABDOMINAL AORTOGRAM W/LOWER EXTREMITY N/A 12/28/2020   Procedure: ABDOMINAL AORTOGRAM W/LOWER EXTREMITY;  Surgeon: Cherre Robins, MD;  Location: [facility] CV LAB;  Service: Cardiovascular;  Laterality: N/A;   APPENDECTOMY  10/01/1994   AV FISTULA PLACEMENT Left 09/14/2021   Procedure: INSERTION OF ARTERIOVENOUS LEFT UPPER GORE-TEX GRAFT ARM;  Surgeon: Cherre Robins, MD;  Location: [facility] OR;  Service: Vascular;  Laterality: Left;   BIOPSY  08/06/2020   Procedure: BIOPSY;  Surgeon: Jackquline Denmark, MD;  Location: [facility] ENDOSCOPY;  Service: Endoscopy;;   COLON SURGERY  10/01/2006   6 inches of colon removed due to obstruction   COLONOSCOPY WITH PROPOFOL N/A 10/25/2016   Procedure: COLONOSCOPY WITH PROPOFOL;  Surgeon: Milus Banister, MD;  Location: [facility] ENDOSCOPY;  Service: Endoscopy;  Laterality: N/A;   COLONOSCOPY WITH PROPOFOL N/A 08/07/2020   Procedure: COLONOSCOPY WITH PROPOFOL;  Surgeon: Jackquline Denmark, MD;  Location: [facility];  Service: Endoscopy;  Laterality: N/A;   COLONOSCOPY WITH PROPOFOL N/A 08/27/2020   Procedure: COLONOSCOPY WITH PROPOFOL;  Surgeon: Lavena Bullion, DO;  Location: [facility];  Service: Gastroenterology;  Laterality: N/A;   CORONARY ANGIOPLASTY WITH STENT PLACEMENT  09/07/2014   "2"   ENTEROSCOPY N/A 08/27/2020   Procedure: ENTEROSCOPY;  Surgeon: Lavena Bullion, DO;  Location: [facility];  Service: Gastroenterology;  Laterality: N/A;  Push enteroscopy    ENTEROSCOPY N/A 06/12/2021   Procedure: ENTEROSCOPY;  Surgeon: Rush Landmark Telford Nab., MD;  Location: [facility];  Service: Gastroenterology;  Laterality: N/A;   ENTEROSCOPY N/A 08/18/2021   Procedure: ENTEROSCOPY;  Surgeon: Jerene Bears, MD;  Location: [facility];  Service: Gastroenterology;  Laterality: N/A;   ESOPHAGOGASTRODUODENOSCOPY (EGD) WITH PROPOFOL N/A 08/06/2020   Procedure: ESOPHAGOGASTRODUODENOSCOPY (EGD) WITH PROPOFOL;  Surgeon: Jackquline Denmark, MD;   [facility];  Service: Endoscopy;  Laterality: N/A;   GIVENS CAPSULE STUDY N/A 08/24/2020   Procedure: GIVENS CAPSULE STUDY;  Surgeon: Lavena Bullion, DO;  Location: [facility];  Service: Gastroenterology;  Laterality: N/A;   GIVENS CAPSULE STUDY N/A 08/18/2021   Procedure: GIVENS CAPSULE;  Surgeon: Jerene Bears, MD;  Location: [facility];  Service: Gastroenterology;  Laterality: N/A;   HOT HEMOSTASIS N/A 08/07/2020   Procedure: HOT HEMOSTASIS (ARGON PLASMA COAGULATION/BICAP);  Surgeon: Jackquline Denmark, MD;  Location: [facility];  Service: Endoscopy;  Laterality: N/A;   HOT HEMOSTASIS N/A 08/27/2020   Procedure: HOT HEMOSTASIS (ARGON PLASMA COAGULATION/BICAP);  Surgeon: Lavena Bullion, DO;  [facility];  Service: Gastroenterology;  Laterality: N/A;   IR FLUORO GUIDE CV LINE RIGHT  08/21/2021   IR THORACENTESIS ASP PLEURAL SPACE W/IMG GUIDE  10/09/2021   IR US GUIDE VASC ACCESS RIGHT  08/21/2021   LEFT HEART CATH AND CORONARY  ANGIOGRAPHY N/A 03/25/2018   Procedure: LEFT HEART CATH AND CORONARY ANGIOGRAPHY;  Surgeon: Nigel Mormon, MD;  Location: [facility] LAB;  Service: Cardiovascular;  Laterality: N/A;   LEFT HEART CATH AND CORONARY ANGIOGRAPHY N/A 05/09/2021   Procedure: LEFT HEART CATH AND CORONARY ANGIOGRAPHY;  Surgeon: Adrian Prows, MD;  Location: [facility] LAB;  Service: Cardiovascular;  Laterality: N/A;   LEFT HEART CATHETERIZATION WITH CORONARY ANGIOGRAM N/A 09/07/2014   Procedure: LEFT HEART CATHETERIZATION WITH CORONARY ANGIOGRAM;  Surgeon: Laverda Page, MD;  Location: [facility] CATH LAB;  Service: Cardiovascular;  Laterality: N/A;   LOWER EXTREMITY ANGIOGRAPHY  10/29/2017   Procedure: Lower Extremity Angiography;  Surgeon: Adrian Prows, MD;  Location: [facility] CV LAB;  Service: Cardiovascular;;   PERIPHERAL VASCULAR CATHETERIZATION N/A 11/01/2015   Procedure: Renal Angiography;  Surgeon: Adrian Prows, MD;  Location: [facility] LAB;   Service: Cardiovascular;  Laterality: N/A;   PERIPHERAL VASCULAR CATHETERIZATION N/A 04/10/2016   Procedure: Renal Angiography;  Surgeon: Adrian Prows, MD;  Location: [facility] CV LAB;  Service: Cardiovascular;  Laterality: N/A;   PERIPHERAL VASCULAR CATHETERIZATION  04/10/2016   Procedure: Peripheral Vascular Intervention;  Surgeon: Adrian Prows, MD;  Location: [facility] LAB;  Service: Cardiovascular;;   PERIPHERAL VASCULAR INTERVENTION  10/29/2017   Procedure: PERIPHERAL VASCULAR INTERVENTION;  Surgeon: Adrian Prows, MD;  Location: [facility] LAB;  Service: Cardiovascular;;   PERIPHERAL VASCULAR INTERVENTION Bilateral 12/28/2020   Procedure: PERIPHERAL VASCULAR INTERVENTION;  Surgeon: Cherre Robins, MD;  Location: [facility];  Service: Cardiovascular;  Laterality: Bilateral;   POLYPECTOMY  08/07/2020   Procedure: POLYPECTOMY;  Surgeon: Jackquline Denmark, MD;  [facility];  Service: Endoscopy;;   POLYPECTOMY  08/27/2020   Procedure: POLYPECTOMY;  Surgeon: Lavena Bullion, DO;  [facility]: [facility];  Service: Gastroenterology;;   POLYPECTOMY     RENAL ANGIOGRAPHY N/A 10/29/2017   Procedure: RENAL ANGIOGRAPHY;  Surgeon: Adrian Prows, MD;  Location: [facility] LAB;  Service: Cardiovascular;  Laterality: N/A;   RENAL ANGIOGRAPHY N/A 05/10/2020   Procedure: RENAL ANGIOGRAPHY;  Surgeon: Nigel Mormon, MD;  Location: [facility] LAB;  Service: Cardiovascular;  Laterality: N/A;   RIGHT OOPHORECTOMY Right 10/01/1994   SUBMUCOSAL TATTOO INJECTION  06/12/2021   Procedure: SUBMUCOSAL TATTOO INJECTION;  Surgeon: Irving Copas., MD;  [facility]: [facility];  Service: Gastroenterology;;   TOTAL ABDOMINAL HYSTERECTOMY  10/02/1995   UPPER GASTROINTESTINAL ENDOSCOPY      Prior to Admission medications   Medication Sig Start Date End Date Taking? Authorizing Provider  albuterol (PROVENTIL) (2.5 MG/3ML) 0.083% nebulizer solution Take 3 mLs (2.5 mg total) by  nebulization every 2 (two) hours as needed for shortness of breath. 10/01/21   Geradine Girt, DO  albuterol (VENTOLIN HFA) 108 (90 Base) MCG/ACT inhaler Inhale 2 puffs into the lungs every 6 (six) hours as needed for wheezing or shortness of breath (asthma). 08/22/21   Aline August, MD  ALPRAZolam Duanne Moron) 1 MG tablet Take 1 mg by mouth 2 (two) times daily as needed for anxiety. 04/15/17   [provider]  amLODipine (NORVASC) 10 MG tablet TAKE 1 TABLET BY MOUTH ONCE DAILY Patient taking differently: Take 10 mg by mouth daily. 04/13/21   Cantwell, Celeste C, PA-C  ascorbic acid (VITAMIN C) 500 MG tablet Take 1 tablet (500 mg total) by mouth 2 (two) times daily. 10/12/21   Hosie Poisson, MD  atorvastatin (LIPITOR) 80 MG tablet Take 80 mg by  mouth at bedtime.    [provider]  azelastine (ASTELIN) 0.1 % nasal spray Place 1 spray into both nostrils daily as needed for allergies (seasonal allergies). 06/24/19   [provider]  calcium acetate (PHOSLO) 667 MG capsule Take 1 capsule (667 mg total) by mouth 3 (three) times daily with meals. 10/12/21   Hosie Poisson, MD  Calcium Carb-Cholecalciferol (CALCIUM 600 + D PO) Take 1 tablet by mouth daily.    [provider]  cetirizine (ZYRTEC) 10 MG tablet Take 10 mg by mouth daily as needed for allergies.    [provider]  Ensure (ENSURE) Take 237 mLs by mouth 2 (two) times daily between meals.    [provider]  ezetimibe (ZETIA) 10 MG tablet TAKE 1 TABLET BY MOUTH ONCE A DAY Patient taking differently: Take 10 mg by mouth daily. 05/03/21   Cantwell, Celeste C, PA-C  folic acid (FOLVITE) 1 MG tablet Take 1 tablet (1 mg total) by mouth daily. 10/13/21   Hosie Poisson, MD  guaiFENesin (ROBITUSSIN) 100 MG/5ML liquid Take 5 mLs by mouth every 4 (four) hours as needed for cough or to loosen phlegm. 10/12/21   Hosie Poisson, MD  isosorbide mononitrate (IMDUR) 60 MG 24 hr tablet Take 1 tablet (60 mg total) by mouth  daily. 10/13/21   Hosie Poisson, MD  LINZESS 72 MCG capsule TAKE 1 CAPSULE BY MOUTH ONCE DAILY BEFORE BREAKFAST Patient taking differently: Take 72 mcg by mouth daily before breakfast. 08/03/21   Levin Erp, PA  losartan (COZAAR) 50 MG tablet Take 1 tablet (50 mg total) by mouth daily. 10/13/21   Hosie Poisson, MD  megestrol (MEGACE) 40 MG tablet Take 40 mg by mouth daily.    [provider]  mometasone-formoterol (DULERA) 100-5 MCG/ACT AERO Take 2 puffs first thing in am and then another 2 puffs about 12 hours later. Patient taking differently: Inhale 2 puffs into the lungs every 12 (twelve) hours. 04/26/17   Tanda Rockers, MD  multivitamin (RENA-VIT) TABS tablet Take 1 tablet by mouth at bedtime. 09/21/21   Mercy Riding, MD  nitroGLYCERIN (NITROSTAT) 0.4 MG SL tablet Place 1 tablet (0.4 mg total) under the tongue every 5 (five) minutes as needed for chest pain. 10/15/19   Miquel Dunn, NP  ondansetron (ZOFRAN-ODT) 4 MG disintegrating tablet Take 1 tablet (4 mg total) by mouth every 8 (eight) hours as needed for nausea or vomiting. Patient taking differently: Take 4 mg by mouth daily. Alternate with Promethazine 07/25/21   Hayden Rasmussen, MD  oxyCODONE-acetaminophen (PERCOCET/ROXICET) 5-325 MG tablet Take 1 tablet by mouth 2 (two) times daily as needed for pain. 09/13/21 09/13/22  [provider]  pantoprazole (PROTONIX) 40 MG tablet Take 1 tablet (40 mg total) by mouth daily. 08/22/21   Aline August, MD  polyethylene glycol powder (MIRALAX) 17 GM/SCOOP powder Take 17 g by mouth 2 (two) times daily as needed for moderate constipation or mild constipation. 09/21/21   Mercy Riding, MD  predniSONE (DELTASONE) 20 MG tablet Take 2 tablets (40 mg total) by mouth daily with breakfast. 10/02/21   Geradine Girt, DO  promethazine (PHENERGAN) 25 MG tablet Take 25 mg by mouth every 6 (six) hours as needed for nausea or vomiting. Alternate with Ondansetron 08/16/20    [provider]  traZODone (DESYREL) 100 MG tablet Take 50-100 mg by mouth at bedtime as needed for sleep.    [provider]  venlafaxine XR (EFFEXOR XR) 150  MG 24 hr capsule Take 2 capsules (300 mg total) by mouth daily with breakfast. 05/12/21   Arrien, Jimmy Picket, MD  vitamin B-12 (CYANOCOBALAMIN) 500 MCG tablet Take 500 mcg by mouth daily.    [provider]    Scheduled Meds:  Infusions:  sodium chloride     PRN Meds:    Allergies as of 10/18/2021 - Review Complete 10/18/2021  Allergen Reaction Noted   Doxycycline Anaphylaxis and Hives    Hydralazine Shortness Of Breath and Rash 08/16/2020   Aspirin Other (See Comments) 10/16/2011   Hydrocodone Nausea And Vomiting 08/11/2021   Ibuprofen Other (See Comments) 07/25/2021   Tylenol [acetaminophen] Other (See Comments) 07/25/2021   Iohexol Itching and Other (See Comments) 10/16/2011    Family History  Problem Relation Age of Onset   Other Mother        many bowel obstructions   Heart disease Mother    Colon polyps Mother    Kidney cancer Father    Bone cancer Father    Diabetes Father    Heart disease Father    Heart disease Brother    Colon cancer Paternal Grandfather    Diabetes Daughter    Esophageal cancer Neg Hx    Rectal cancer Neg Hx    Stomach cancer Neg Hx     Social History   Socioeconomic History   Marital status: Divorced    Spouse name: Not on file   Number of children: 1   Years of education: Not on file   Highest education level: Not on file  Occupational History   Occupation: disabled  Tobacco Use   Smoking status: Former    Packs/day: 1.50    Years: 40.00    Pack years: 60.00    Types: Cigarettes    Quit date: 08/2021    Years since quitting: 0.2   Smokeless tobacco: Never   Tobacco comments:    She has cheated  Vaping Use   Vaping Use: Former   Substances: THC  Substance and Sexual Activity   Alcohol use: No   Drug use: Yes    Frequency: 3.0 times  per week    Types: Marijuana    Comment: not much lately   Sexual activity: Yes    Birth control/protection: Post-menopausal, Surgical    Comment: Hysterectomy  Other Topics Concern   Not on file  Social History Narrative   Not on file   Social Determinants of Health   Financial Resource Strain: Not on file  Food Insecurity: Not on file  Transportation Needs: Not on file  Physical Activity: Not on file  Stress: Not on file  Social Connections: Not on file  Intimate Partner Violence: Not on file    REVIEW OF SYSTEMS: Constitutional: Feeling some fatigue and weakness. ENT:  No nose bleeds Pulm: Shortness of breath or cough. CV:  No palpitations, no LE edema.  No chest pain. GU:  No hematuria, no frequency GI: See HPI. Heme: Other than the hematochezia, no other unusual bleeding or bruising. Transfusions: Multiple transfusions over the past 1.5 years Neuro:  No headaches, no peripheral tingling or numbness.  No syncope, no seizures. MS: Normally walks with the assistance of a walker. Derm:  No itching, no rash or sores.  Endocrine:  No sweats or chills.  No polyuria or dysuria Immunization: Reviewed. Travel:  None recently.   PHYSICAL EXAM: Vital signs in last 24 hours: Vitals:   10/18/21 0925  BP: 124/60  Pulse: 89  Resp:  18  Temp: 98.2 F (36.8 C)  SpO2: 100%   Wt Readings from Last 3 Encounters:  10/12/21 36.5 kg  10/01/21 39.6 kg  09/20/21 39 kg    General: Patient is cachectic, chronically/severely ill appearing with ashen coloring Head: No facial asymmetry or swelling.  No signs of head trauma. Eyes: No scleral icterus or conjunctival pallor.  EOMI. Ears: Not hard of hearing.   Nose: No congestion or discharge Mouth: Dentures in place, not removed for exam.  Mucosa is moist, pink, clear.  Tongue midline. Neck: No JVD, no masses, no thyromegaly Lungs: Diminished but clear breath sounds bilaterally.  No labored breathing.  No cough.  Raspy vocal  quality. Heart: RRR.  No MRG.  S1, S2 present. Abdomen: Soft without distention.  Bowel sounds present without high-pitched or tinkling bowel sounds.  Tenderness focused in the left mid to upper abdomen.  Did not have guarding or rebound with light pressure exam..   Rectal: Deferred.  Patient insisted on getting pain meds before she would consent to DRE. Musc/Skeltl: Thin arms and legs.  No joint redness or swelling or gross deformities/contractures. Extremities: 1+ pedal edema.  Feet are warm.  LUE AV fistula. Neurologic: Alert.  Oriented x3.  Moves all 4 limbs without tremor, strength not tested.  No gross deficits. Skin: Sallow/ashen coloring.  No significant purpura or bruising. Nodes: No cervical adenopathy Psych: Anxious, somewhat agitated.  Overall cooperative.  Intake/Output from previous day: No intake/output data recorded. Intake/Output this shift: No intake/output data recorded.  LAB RESULTS: Recent Labs    10/18/21 0936 10/18/21 0944  WBC 9.3  --   HGB 4.4* 5.1*  HCT 14.4* 15.0*  PLT 381  --    BMET Lab Results  Component Value Date   NA 134 (L) 10/18/2021   NA 135 10/18/2021   NA 135 10/12/2021   K 4.1 10/18/2021   K 4.1 10/18/2021   K 4.1 10/12/2021   CL 95 (L) 10/18/2021   CL 98 10/18/2021   CL 95 (L) 10/12/2021   CO2 27 10/18/2021   CO2 29 10/12/2021   CO2 22 10/11/2021   GLUCOSE 99 10/18/2021   GLUCOSE 101 (H) 10/18/2021   GLUCOSE 88 10/12/2021   BUN 33 (H) 10/18/2021   BUN 34 (H) 10/18/2021   BUN 27 (H) 10/12/2021   CREATININE 3.10 (H) 10/18/2021   CREATININE 2.91 (H) 10/18/2021   CREATININE 2.29 (H) 10/12/2021   CALCIUM 7.9 (L) 10/18/2021   CALCIUM 8.5 (L) 10/12/2021   CALCIUM 8.8 (L) 10/11/2021   LFT Recent Labs    10/18/21 0936  PROT 5.1*  ALBUMIN 2.3*  AST 28  ALT 27  ALKPHOS 80  BILITOT 0.2*   PT/INR Lab Results  Component Value Date   INR 0.9 08/15/2021   INR 0.9 06/10/2021   INR 1.0 08/24/2020   Hepatitis Panel No  results for input(s): HEPBSAG, HCVAB, HEPAIGM, HEPBIGM in the last 72 hours. C-Diff No components found for: CDIFF Lipase     Component Value Date/Time   LIPASE 34 09/18/2021 1317   LIPASE 509 (H) 06/07/2012 1350      RADIOLOGY STUDIES: No results found.   IMPRESSION:      Hematochezia, began 2 days ago, last episode yesterday evening.  Initially stable chronic abdominal pain but since arrival in the ED this morning experiencing intense left abdominal pain.   Etiologies include bowel ischemia, diverticular bleed.  Given described volume of hematochezia, AVMs possible but less likely.  Issues w  constipation treated w linzess, not recently an issue so stercoral ulcer less likely.  Has undergone multiple upper/lower endoscopies, capsule endoscopies over the past few years with findings noted above.  Daily meds include Protonix 40 mg daily.       Acute blood loss anemia on top of anemia of chronic kidney disease.  Outpt meds include P50, Folic acid.  Per recent dialysis note she recently received Venofer and might start ESA.  2 PRBCs ordered.      ESRD.  HD MWF.  Has not had HD for today (Wednesday).    Periph artery disease, mesenteric ischemia.  11/2020 SMA, aortic, bilateral common iliac stenting.    Bipolar d/O, hx schizophrenia.  Significant anxiety currently.      Chronic MS and Abd pain.  Prn percocet up to 2 x daily.      COPD w recent flares.  Med list from 1/12 discharge includes Prednisone 40 mg daily (no mention of taper).       PLAN:        Per Dr. Silverio Decamp.   Azucena Freed  10/18/2021, 10:41 AM Phone [phone]   Attending physician's note  I have taken a history, reviewed the chart and examined the patient. I performed a substantive portion of this encounter, including complete performance of at least one of the key components, in conjunction with the APP. I agree with the APP's note, impression and recommendations.    61 year old female with multiple  comorbidities including end-stage renal disease on hemodialysis, CAD, arctic stenosis, COPD on home O2, peripheral vascular disease, mesenteric ischemia s/p SMA stent She was previously on aspirin and Plavix, was discontinued due to recurrent anemia and transfusion requirement  Patient presented with hematochezia and hemoglobin 4.4 She has had extensive GI evaluation, most recent endoscopic evaluation was within the past 3 months  Most likely etiology for hematochezia based on history is likely ischemic colitis.  She has not had any bowel movement since arrival to ER Continue to monitor and supportive care with MAP>60 PRBC transfusion to goal hemoglobin >7  No plan for repeat endoscopic evaluation  End-stage renal disease on hemodialysis, management per nephrology  Please consult palliative care to discuss goals of care   The patient was provided an opportunity to ask questions and all were answered. The patient agreed with the plan and demonstrated an understanding of the instructions.  Damaris Hippo , MD [phone]

## 2021-10-18 NOTE — H&P (Signed)
History and Physical    [name] RKY:[id number] DOB: 11-12-1960 DOA: 10/18/2021  PCP: Bernerd Limbo, MD  Patient coming from: Home  Chief Complaint: Bright red blood per rectum  HPI: [name] is a 61 y.o. female with medical history significant of end-stage renal disease hemodialysis Monday Wednesday Friday, coronary artery disease, aortic stenosis, bipolar disorder, chronic mesenteric ischemia, status post SMA aortic and bilateral common iliac stenting, status post colon resection, chronic small bowel AVMs comes in with bright red blood per rectum and melanotic stool for the last 2 days.  Patient has frequent GI bleeds and has required multiple transfusions in the past.  She denies any fevers.  She denies any nausea vomiting.  She has had at least 6-10 bowel movements that have been bloody over the last 48 hours.  She denies any pain.  Patient found to have a hemoglobin of 4.47 days ago was 7.6.  Patient referred for admission for acute GI bleed.  She is a dialysis patient is compliant with it.  Nephrology has also been called to see patient consultation.  She is had multiple colonoscopies in the past.    Review of Systems: As per HPI otherwise 10 point review of systems negative.   Past Medical History:  Diagnosis Date   Aortic stenosis    abominal aorta, s/p distal aortic stent 09/28/18, 12/01/20   Bipolar 1 disorder (HCC)    Blood transfusion without reported diagnosis    Carpal tunnel syndrome    Cataract    forming   Cervical cancer ([place]) 1985   CHF (congestive heart failure) ([place])    Chronic kidney disease (CKD), stage V ([place])    COPD (chronic obstructive pulmonary disease) ([place]) 2000   Coronary artery disease    Diverticulitis 2008   GERD (gastroesophageal reflux disease)    Glaucoma    HLD (hyperlipidemia) 2013   Hypertension 2013   Hypovitaminosis D    IBS (irritable bowel syndrome) 2008   PAD (peripheral artery disease) (HCC)    Pancreatitis 10/2011    RLS (restless legs syndrome)    Schizophrenia ([place])    Skin cancer    Small bowel obstruction ([place]) 2008   Thyroid disease     Past Surgical History:  Procedure Laterality Date   ABDOMINAL ANGIOGRAM N/A 09/07/2014   Procedure: ABDOMINAL ANGIOGRAM;  Surgeon: Laverda Page, MD;  Location: [facility] CATH LAB;  Service: Cardiovascular;  Laterality: N/A;   ABDOMINAL AORTOGRAM W/LOWER EXTREMITY N/A 12/28/2020   Procedure: ABDOMINAL AORTOGRAM W/LOWER EXTREMITY;  Surgeon: Cherre Robins, MD;  Location: [place] CV LAB;  Service: Cardiovascular;  Laterality: N/A;   APPENDECTOMY  10/01/1994   AV FISTULA PLACEMENT Left 09/14/2021   Procedure: INSERTION OF ARTERIOVENOUS LEFT UPPER GORE-TEX GRAFT ARM;  Surgeon: Cherre Robins, MD;  Location: [facility] OR;  Service: Vascular;  Laterality: Left;   BIOPSY  08/06/2020   Procedure: BIOPSY;  Surgeon: Jackquline Denmark, MD;  [facility]: [facility] ENDOSCOPY;  Service: Endoscopy;;   COLON SURGERY  10/01/2006   6 inches of colon removed due to obstruction   COLONOSCOPY WITH PROPOFOL N/A 10/25/2016   Procedure: COLONOSCOPY WITH PROPOFOL;  Surgeon: Milus Banister, MD;  [facility] ENDOSCOPY;  Service: Endoscopy;  Laterality: N/A;   COLONOSCOPY WITH PROPOFOL N/A 08/07/2020   Procedure: COLONOSCOPY WITH PROPOFOL;  Surgeon: Jackquline Denmark, MD;  [facility];  Service: Endoscopy;  Laterality: N/A;   COLONOSCOPY WITH PROPOFOL N/A 08/27/2020   Procedure: COLONOSCOPY WITH PROPOFOL;  Surgeon: Bryan Lemma,  Dominic Pea, DO;  [facility];  Service: Gastroenterology;  Laterality: N/A;   CORONARY ANGIOPLASTY WITH STENT PLACEMENT  09/07/2014   "2"   ENTEROSCOPY N/A 08/27/2020   Procedure: ENTEROSCOPY;  Surgeon: Lavena Bullion, DO;  [facility];  Service: Gastroenterology;  Laterality: N/A;  Push enteroscopy    ENTEROSCOPY N/A 06/12/2021   Procedure: ENTEROSCOPY;  Surgeon: Rush Landmark Telford Nab., MD;  [facility];  Service: Gastroenterology;   Laterality: N/A;   ENTEROSCOPY N/A 08/18/2021   Procedure: ENTEROSCOPY;  Surgeon: Jerene Bears, MD;  [facility];  Service: Gastroenterology;  Laterality: N/A;   ESOPHAGOGASTRODUODENOSCOPY (EGD) WITH PROPOFOL N/A 08/06/2020   Procedure: ESOPHAGOGASTRODUODENOSCOPY (EGD) WITH PROPOFOL;  Surgeon: Jackquline Denmark, MD;  [facility];  Service: Endoscopy;  Laterality: N/A;   GIVENS CAPSULE STUDY N/A 08/24/2020   Procedure: GIVENS CAPSULE STUDY;  Surgeon: Lavena Bullion, DO;  [facility];  Service: Gastroenterology;  Laterality: N/A;   GIVENS CAPSULE STUDY N/A 08/18/2021   Procedure: GIVENS CAPSULE;  Surgeon: Jerene Bears, MD;  [facility];  Service: Gastroenterology;  Laterality: N/A;   HOT HEMOSTASIS N/A 08/07/2020   Procedure: HOT HEMOSTASIS (ARGON PLASMA COAGULATION/BICAP);  Surgeon: Jackquline Denmark, MD;  Location: [facility];  Service: Endoscopy;  Laterality: N/A;   HOT HEMOSTASIS N/A 08/27/2020   Procedure: HOT HEMOSTASIS (ARGON PLASMA COAGULATION/BICAP);  Surgeon: Lavena Bullion, DO;  [facility];  Service: Gastroenterology;  Laterality: N/A;   IR FLUORO GUIDE CV LINE RIGHT  08/21/2021   IR THORACENTESIS ASP PLEURAL SPACE W/IMG GUIDE  10/09/2021   IR US GUIDE VASC ACCESS RIGHT  08/21/2021   LEFT HEART CATH AND CORONARY ANGIOGRAPHY N/A 03/25/2018   Procedure: LEFT HEART CATH AND CORONARY ANGIOGRAPHY;  Surgeon: Nigel Mormon, MD;  Location: [place] CV LAB;  Service: Cardiovascular;  Laterality: N/A;   LEFT HEART CATH AND CORONARY ANGIOGRAPHY N/A 05/09/2021   Procedure: LEFT HEART CATH AND CORONARY ANGIOGRAPHY;  Surgeon: Adrian Prows, MD;  Location: [place] CV LAB;  Service: Cardiovascular;  Laterality: N/A;   LEFT HEART CATHETERIZATION WITH CORONARY ANGIOGRAM N/A 09/07/2014   Procedure: LEFT HEART CATHETERIZATION WITH CORONARY ANGIOGRAM;  Surgeon: Laverda Page, MD;  Location: [facility] CATH LAB;  Service: Cardiovascular;   Laterality: N/A;   LOWER EXTREMITY ANGIOGRAPHY  10/29/2017   Procedure: Lower Extremity Angiography;  Surgeon: Adrian Prows, MD;  Location: [place] CV LAB;  Service: Cardiovascular;;   PERIPHERAL VASCULAR CATHETERIZATION N/A 11/01/2015   Procedure: Renal Angiography;  Surgeon: Adrian Prows, MD;  Location: [place] CV LAB;  Service: Cardiovascular;  Laterality: N/A;   PERIPHERAL VASCULAR CATHETERIZATION N/A 04/10/2016   Procedure: Renal Angiography;  Surgeon: Adrian Prows, MD;  Location: [place] CV LAB;  Service: Cardiovascular;  Laterality: N/A;   PERIPHERAL VASCULAR CATHETERIZATION  04/10/2016   Procedure: Peripheral Vascular Intervention;  Surgeon: Adrian Prows, MD;  Location: [place] CV LAB;  Service: Cardiovascular;;   PERIPHERAL VASCULAR INTERVENTION  10/29/2017   Procedure: PERIPHERAL VASCULAR INTERVENTION;  Surgeon: Adrian Prows, MD;  Location: [place] CV LAB;  Service: Cardiovascular;;   PERIPHERAL VASCULAR INTERVENTION Bilateral 12/28/2020   Procedure: PERIPHERAL VASCULAR INTERVENTION;  Surgeon: Cherre Robins, MD;  Location: [place] CV LAB;  Service: Cardiovascular;  Laterality: Bilateral;   POLYPECTOMY  08/07/2020   Procedure: POLYPECTOMY;  Surgeon: Jackquline Denmark, MD;  [facility];  Service: Endoscopy;;   POLYPECTOMY  08/27/2020   Procedure: POLYPECTOMY;  Surgeon: Lavena Bullion, DO;  [facility];  Service: Gastroenterology;;   POLYPECTOMY     RENAL ANGIOGRAPHY N/A 10/29/2017   Procedure: RENAL ANGIOGRAPHY;  Surgeon: Adrian Prows, MD;  Location: [place] CV LAB;  Service: Cardiovascular;  Laterality: N/A;   RENAL ANGIOGRAPHY N/A 05/10/2020   Procedure: RENAL ANGIOGRAPHY;  Surgeon: Nigel Mormon, MD;  Location: [place] CV LAB;  Service: Cardiovascular;  Laterality: N/A;   RIGHT OOPHORECTOMY Right 10/01/1994   SUBMUCOSAL TATTOO INJECTION  06/12/2021   Procedure: SUBMUCOSAL TATTOO INJECTION;  Surgeon: Irving Copas., MD;   [facility];  Service: Gastroenterology;;   TOTAL ABDOMINAL HYSTERECTOMY  10/02/1995   UPPER GASTROINTESTINAL ENDOSCOPY       reports that she quit smoking about 2 months ago. Her smoking use included cigarettes. She has a 60.00 pack-year smoking history. She has never used smokeless tobacco. She reports current drug use. Frequency: 3.00 times per week. Drug: Marijuana. She reports that she does not drink alcohol.  Allergies  Allergen Reactions   Doxycycline Anaphylaxis and Hives   Hydralazine Shortness Of Breath and Rash    Patient reports "couldn't breath"   Aspirin Other (See Comments)    Internal bleeding- MD SAID to not take this   Hydrocodone Nausea And Vomiting   Ibuprofen Other (See Comments)    Caused internal bleeding   Tylenol [Acetaminophen] Other (See Comments)    MD told the patient to not take this   Iohexol Itching and Other (See Comments)    Pt has itching nose after iv contrast injection    Family History  Problem Relation Age of Onset   Other Mother        many bowel obstructions   Heart disease Mother    Colon polyps Mother    Kidney cancer Father    Bone cancer Father    Diabetes Father    Heart disease Father    Heart disease Brother    Colon cancer Paternal Grandfather    Diabetes Daughter    Esophageal cancer Neg Hx    Rectal cancer Neg Hx    Stomach cancer Neg Hx     Prior to Admission medications   Medication Sig Start Date End Date Taking? Authorizing Provider  albuterol (PROVENTIL) (2.5 MG/3ML) 0.083% nebulizer solution Take 3 mLs (2.5 mg total) by nebulization every 2 (two) hours as needed for shortness of breath. 10/01/21   Geradine Girt, DO  albuterol (VENTOLIN HFA) 108 (90 Base) MCG/ACT inhaler Inhale 2 puffs into the lungs every 6 (six) hours as needed for wheezing or shortness of breath (asthma). 08/22/21   Aline August, MD  ALPRAZolam Duanne Moron) 1 MG tablet Take 1 mg by mouth 2 (two) times daily as needed for anxiety. 04/15/17    [provider]  amLODipine (NORVASC) 10 MG tablet TAKE 1 TABLET BY MOUTH ONCE DAILY Patient taking differently: Take 10 mg by mouth daily. 04/13/21   Cantwell, Celeste C, PA-C  ascorbic acid (VITAMIN C) 500 MG tablet Take 1 tablet (500 mg total) by mouth 2 (two) times daily. 10/12/21   Hosie Poisson, MD  atorvastatin (LIPITOR) 80 MG tablet Take 80 mg by mouth at bedtime.    [provider]  azelastine (ASTELIN) 0.1 % nasal spray Place 1 spray into both nostrils daily as needed for allergies (seasonal allergies). 06/24/19   [provider]  calcium acetate (PHOSLO) 667 MG capsule Take 1 capsule (667 mg total) by mouth 3 (three) times daily with meals. 10/12/21   Hosie Poisson, MD  Calcium Carb-Cholecalciferol (CALCIUM 600 + D PO) Take 1 tablet by mouth daily.    [provider]  cetirizine (ZYRTEC) 10 MG tablet Take 10 mg by mouth daily as needed for allergies.    [provider]  Ensure (ENSURE) Take 237 mLs by mouth 2 (two) times daily between meals.    [provider]  ezetimibe (ZETIA) 10 MG tablet TAKE 1 TABLET BY MOUTH ONCE A DAY Patient taking differently: Take 10 mg by mouth daily. 05/03/21   Cantwell, Celeste C, PA-C  folic acid (FOLVITE) 1 MG tablet Take 1 tablet (1 mg total) by mouth daily. 10/13/21   Hosie Poisson, MD  guaiFENesin (ROBITUSSIN) 100 MG/5ML liquid Take 5 mLs by mouth every 4 (four) hours as needed for cough or to loosen phlegm. 10/12/21   Hosie Poisson, MD  isosorbide mononitrate (IMDUR) 60 MG 24 hr tablet Take 1 tablet (60 mg total) by mouth daily. 10/13/21   Hosie Poisson, MD  LINZESS 72 MCG capsule TAKE 1 CAPSULE BY MOUTH ONCE DAILY BEFORE BREAKFAST Patient taking differently: Take 72 mcg by mouth daily before breakfast. 08/03/21   Levin Erp, PA  losartan (COZAAR) 50 MG tablet Take 1 tablet (50 mg total) by mouth daily. 10/13/21   Hosie Poisson, MD  megestrol (MEGACE) 40 MG tablet Take 40 mg by mouth daily.     [provider]  mometasone-formoterol (DULERA) 100-5 MCG/ACT AERO Take 2 puffs first thing in am and then another 2 puffs about 12 hours later. Patient taking differently: Inhale 2 puffs into the lungs every 12 (twelve) hours. 04/26/17   Tanda Rockers, MD  multivitamin (RENA-VIT) TABS tablet Take 1 tablet by mouth at bedtime. 09/21/21   Mercy Riding, MD  nitroGLYCERIN (NITROSTAT) 0.4 MG SL tablet Place 1 tablet (0.4 mg total) under the tongue every 5 (five) minutes as needed for chest pain. 10/15/19   Miquel Dunn, NP  ondansetron (ZOFRAN-ODT) 4 MG disintegrating tablet Take 1 tablet (4 mg total) by mouth every 8 (eight) hours as needed for nausea or vomiting. Patient taking differently: Take 4 mg by mouth daily. Alternate with Promethazine 07/25/21   Hayden Rasmussen, MD  oxyCODONE-acetaminophen (PERCOCET/ROXICET) 5-325 MG tablet Take 1 tablet by mouth 2 (two) times daily as needed for pain. 09/13/21 09/13/22  [provider]  pantoprazole (PROTONIX) 40 MG tablet Take 1 tablet (40 mg total) by mouth daily. 08/22/21   Aline August, MD  polyethylene glycol powder (MIRALAX) 17 GM/SCOOP powder Take 17 g by mouth 2 (two) times daily as needed for moderate constipation or mild constipation. 09/21/21   Mercy Riding, MD  predniSONE (DELTASONE) 20 MG tablet Take 2 tablets (40 mg total) by mouth daily with breakfast. 10/02/21   Geradine Girt, DO  promethazine (PHENERGAN) 25 MG tablet Take 25 mg by mouth every 6 (six) hours as needed for nausea or vomiting. Alternate with Ondansetron 08/16/20   [provider]  traZODone (DESYREL) 100 MG tablet Take 50-100 mg by mouth at bedtime as needed for sleep.    [provider]  venlafaxine XR (EFFEXOR XR) 150 MG 24 hr capsule Take 2 capsules (300 mg total) by mouth daily with breakfast. 05/12/21   Arrien, Jimmy Picket, MD  vitamin B-12 (CYANOCOBALAMIN) 500 MCG tablet Take 500 mcg by mouth daily.    [provider]    Physical Exam: Vitals:   10/18/21 1115 10/18/21 1130 10/18/21 1145 10/18/21 1200  BP: 138/66 136/62 133/61 Marland Kitchen)  144/64  Pulse: 86 84 80 84  Resp: (!) 21 14 13  (!) 21  Temp:  98.3 F (36.8 C)    TempSrc:  Oral    SpO2: 100% 98% 99% 100%      Constitutional: NAD, calm, comfortable very underweight Vitals:   10/18/21 1115 10/18/21 1130 10/18/21 1145 10/18/21 1200  BP: 138/66 136/62 133/61 (!) 144/64  Pulse: 86 84 80 84  Resp: (!) 21 14 13  (!) 21  Temp:  98.3 F (36.8 C)    TempSrc:  Oral    SpO2: 100% 98% 99% 100%   Eyes: PERRL, lids and conjunctivae normal ENMT: Mucous membranes are moist. Posterior pharynx clear of any exudate or lesions.Normal dentition.  Neck: normal, supple, no masses, no thyromegaly Respiratory: clear to auscultation bilaterally, no wheezing, no crackles. Normal respiratory effort. No accessory muscle use.  Cardiovascular: Regular rate and rhythm, no murmurs / rubs / gallops. No extremity edema. 2+ pedal pulses. No carotid bruits.  Abdomen: no tenderness, no masses palpated. No hepatosplenomegaly. Bowel sounds positive.  Musculoskeletal: no clubbing / cyanosis. No joint deformity upper and lower extremities. Good ROM, no contractures. Normal muscle tone.  Skin: no rashes, lesions, ulcers. No induration Neurologic: CN 2-12 grossly intact. Sensation intact, DTR normal. Strength 5/5 in all 4.  Psychiatric: Normal judgment and insight. Alert and oriented x 3. Normal mood.    Labs on Admission: I have personally reviewed following labs and imaging studies  CBC: Recent Labs  Lab 10/18/21 0936 10/18/21 0944  WBC 9.3  --   NEUTROABS 7.7  --   HGB 4.4* 5.1*  HCT 14.4* 15.0*  MCV 104.3*  --   PLT 381  --    Basic Metabolic Panel: Recent Labs  Lab 10/12/21 0247 10/18/21 0936 10/18/21 0944  NA 135 135 134*  K 4.1 4.1 4.1  CL 95* 98 95*  CO2 29 27  --   GLUCOSE 88 101* 99  BUN 27* 34* 33*  CREATININE 2.29* 2.91* 3.10*  CALCIUM  8.5* 7.9*  --   PHOS 3.2  --   --    GFR: Estimated Creatinine Clearance: 11.3 mL/min (A) (by C-G formula based on SCr of 3.1 mg/dL (H)). Liver Function Tests: Recent Labs  Lab 10/12/21 0247 10/18/21 0936  AST  --  28  ALT  --  27  ALKPHOS  --  80  BILITOT  --  0.2*  PROT  --  5.1*  ALBUMIN 1.8* 2.3*   No results for input(s): LIPASE, AMYLASE in the last 168 hours. No results for input(s): AMMONIA in the last 168 hours. Coagulation Profile: No results for input(s): INR, PROTIME in the last 168 hours. Cardiac Enzymes: No results for input(s): CKTOTAL, CKMB, CKMBINDEX, TROPONINI in the last 168 hours. BNP (last 3 results) No results for input(s): PROBNP in the last 8760 hours. HbA1C: No results for input(s): HGBA1C in the last 72 hours. CBG: No results for input(s): GLUCAP in the last 168 hours. Lipid Profile: No results for input(s): CHOL, HDL, LDLCALC, TRIG, CHOLHDL, LDLDIRECT in the last 72 hours. Thyroid Function Tests: No results for input(s): TSH, T4TOTAL, FREET4, T3FREE, THYROIDAB in the last 72 hours. Anemia Panel: No results for input(s): VITAMINB12, FOLATE, FERRITIN, TIBC, IRON, RETICCTPCT in the last 72 hours. Urine analysis:    Component Value Date/Time   COLORURINE YELLOW 08/20/2021 0140   APPEARANCEUR CLEAR 08/20/2021 0140   APPEARANCEUR Cloudy (A) 03/22/2021 1432   LABSPEC 1.012 08/20/2021 0140   LABSPEC 1.016 06/07/2012  1350   PHURINE 5.0 08/20/2021 0140   GLUCOSEU 50 (A) 08/20/2021 0140   GLUCOSEU Negative 06/07/2012 1350   HGBUR SMALL (A) 08/20/2021 0140   BILIRUBINUR NEGATIVE 08/20/2021 0140   BILIRUBINUR Negative 03/22/2021 1432   BILIRUBINUR Negative 06/07/2012 1350   KETONESUR NEGATIVE 08/20/2021 0140   PROTEINUR 100 (A) 08/20/2021 0140   UROBILINOGEN 0.2 06/28/2014 1518   NITRITE NEGATIVE 08/20/2021 0140   LEUKOCYTESUR NEGATIVE 08/20/2021 0140   LEUKOCYTESUR Negative 06/07/2012 1350   Sepsis Labs:  !!!!!!!!!!!!!!!!!!!!!!!!!!!!!!!!!!!!!!!!!!!! @LABRCNTIP (procalcitonin:4,lacticidven:4) ) Recent Results (from the past 240 hour(s))  Culture, body fluid w Gram Stain-bottle     Status: None   Collection Time: 10/09/21  3:16 PM   Specimen: Fluid  Result Value Ref Range Status   Specimen Description FLUID PLEURAL  Final   Special Requests BOTTLES DRAWN AEROBIC AND ANAEROBIC  Final   Culture   Final    NO GROWTH 5 DAYS Performed at [facility], [place] [address] [place], [place] [address]    Report Status 10/14/2021 FINAL  Final  Gram stain     Status: None   Collection Time: 10/09/21  3:16 PM   Specimen: Fluid  Result Value Ref Range Status   Specimen Description FLUID PLEURAL  Final   Special Requests NONE  Final   Gram Stain   Final    FEW WBC PRESENT,BOTH PMN AND MONONUCLEAR NO ORGANISMS SEEN Performed at [facility], [address] [place], [place] [address]    Report Status 10/10/2021 FINAL  Final     Radiological Exams on Admission: No results found.  EKG: Independently reviewed.  Normal sinus rhythm no acute changes  Assessment/Plan 61 year old female with history of chronic ischemic bowel, multiple AVMs, chronic GI bleed, end-stage renal disease on dialysis comes in with acute lower GI bleed  Principal Problem:    Acute blood loss anemia-hemoglobin 4.  Transfuse 2 units of blood.  Vital signs stable.  GI consulted low [place] to see.  Patient has not had any bleeding since arrival to the ED.  Keep n.p.o. except meds not on any anticoagulants or antiplatelets chronically.  Active Problems:    Acute GI bleeding-as above.    Angiodysplasia of colon-noted    Chronic mesenteric ischemia (HCC)-noted    COPD (chronic obstructive pulmonary disease) (HCC)-lungs clear at this time continue chronic inhalers    Chronic pain disorder-continue chronic pain meds    Further recommendations pending over hospital course   DVT prophylaxis: SCDs Code Status:  DNR Family Communication: Friend in room Disposition Plan: 1 to 3 days Consults called: GI Admission status: Admission   Ahlaya Ende A MD [facility]  If 7PM-7AM, please contact night-coverage [email or web address] Password [facility]  10/18/2021, 12:14 PM

## 2021-10-18 NOTE — Progress Notes (Addendum)
[facility] Progress Note   Interim History:  Discharged from [facility] 1/12 following admit with flash pulmonary edema in the setting of HTN urgency. EDW was lowered to 36.5kg. At her dialysis center was found to have Hgb 5.6 and she was instructed to go the ED.  Hgb was 4.4>5.1  on arrival and she is admitted for transfusion and further evaluation. GI has been consulted.    Subjective:  Seen and examined in the ED. Transfusion in progress. Endorses bloody stools intermittently for last 2-3 days. None this morning, but c/o left sided abdominal pain. Asking for pain meds.    Objective Vitals:   10/18/21 1130 10/18/21 1145 10/18/21 1200 10/18/21 1245  BP: 136/62 133/61 (!) 144/64 (!) 153/68  Pulse: 84 80 84 87  Resp: 14 13 (!) 21 (!) 25  Temp: 98.3 F (36.8 C)     TempSrc: Oral     SpO2: 98% 99% 100% 99%   Physical Exam General: Chronically ill appearing, thin, nad  Heart: S1,S2 irreg. No M/R/G Lungs: Decreased in bases otherwise CTAB. No WOB.  Abdomen: soft, non-distended, tender to palpation LUE Extremities: No LE edema.  Dialysis Access: RIJ TDC drsg intact. L AVG +bruit    Additional Objective Labs: Basic Metabolic Panel: Recent Labs  Lab 10/12/21 0247 10/18/21 0936 10/18/21 0944  NA 135 135 134*  K 4.1 4.1 4.1  CL 95* 98 95*  CO2 29 27  --   GLUCOSE 88 101* 99  BUN 27* 34* 33*  CREATININE 2.29* 2.91* 3.10*  CALCIUM 8.5* 7.9*  --   PHOS 3.2  --   --     Liver Function Tests: Recent Labs  Lab 10/12/21 0247 10/18/21 0936  AST  --  28  ALT  --  27  ALKPHOS  --  80  BILITOT  --  0.2*  PROT  --  5.1*  ALBUMIN 1.8* 2.3*    No results for input(s): LIPASE, AMYLASE in the last 168 hours. CBC: Recent Labs  Lab 10/18/21 0936 10/18/21 0944  WBC 9.3  --   NEUTROABS 7.7  --   HGB 4.4* 5.1*  HCT 14.4* 15.0*  MCV 104.3*  --   PLT 381  --     Blood Culture    Component Value Date/Time   SDES FLUID PLEURAL 10/09/2021 1516   SDES FLUID  PLEURAL 10/09/2021 1516   SPECREQUEST BOTTLES DRAWN AEROBIC AND ANAEROBIC 10/09/2021 1516   SPECREQUEST NONE 10/09/2021 1516   CULT  10/09/2021 1516    NO GROWTH 5 DAYS Performed at [facility], [place] [address], [place], [place] [address]    REPTSTATUS 10/14/2021 FINAL 10/09/2021 1516   REPTSTATUS 10/10/2021 FINAL 10/09/2021 1516    Cardiac Enzymes: No results for input(s): CKTOTAL, CKMB, CKMBINDEX, TROPONINI in the last 168 hours. CBG: No results for input(s): GLUCAP in the last 168 hours. Iron Studies: No results for input(s): IRON, TIBC, TRANSFERRIN, FERRITIN in the last 72 hours. @lablastinr3 @ Studies/Results: No results found. Medications:  sodium chloride      amLODipine  10 mg Oral Daily   megestrol  40 mg Oral Daily   mometasone-formoterol  2 puff Inhalation Q12H   pantoprazole  40 mg Oral Daily   sodium chloride flush  3 mL Intravenous Q12H   [START ON 10/19/2021] venlafaxine XR  300 mg Oral Q breakfast     OP HD: MWF GO   3h 33min  36.5kg   3K/2.5 bath  Hep none  TDC/  LUA AVG done 09/14/21  - calcitriol 0.5 mcg po tiw  - mircera 75 mcg on 09/15/21     Assessment/Plan:  Hematochezia/ABLA   - Hgb 4.4 on admission. 2 U prbcs ordered. GI consulted --No plans for further endoscopy per notes.  ESRD - HD MWF. HD today or tomorrow am  depending on inpatient dialysis census/staffing.  Access --Using AVG at OP center -will cannulate here.  HTN/volume  - Continue home meds. UF to EDW as tolerated.  Anemia of CKD - Got Aranesp 150 on 10/11/21. Redose with next HD.  MBD --Corr Ca at goal. Check Phos. Continue home binders,calcitriol.  COPD -Unsure of her baseline respiratory status but suspect she has limited reserve.   Lynnda Child PA-C [facility] 10/18/2021,1:33 PM

## 2021-10-18 NOTE — ED Provider Notes (Addendum)
[facility] EMERGENCY DEPARTMENT Provider Note   CSN: [id number] Arrival date & time: 10/18/21  3382     History  Chief Complaint  Patient presents with   Rectal Bleeding   Abdominal Pain    [name] is a 61 y.o. female.  HPI Patient with multiple medical issues including end-stage renal disease, chronic mesenteric ischemia, prior SMA stent presents from dialysis due to concern for weakness, anemia.  Patient has recent hospitalization, seemingly has resumed her dialysis schedule, but today worsened after her blood counts were found to be critically abnormal with a hemoglobin value of 5.  Patient has some abdominal discomfort, this is seemingly unchanged from baseline, no report of new syncope, fall.    Home Medications Prior to Admission medications   Medication Sig Start Date End Date Taking? Authorizing Provider  albuterol (PROVENTIL) (2.5 MG/3ML) 0.083% nebulizer solution Take 3 mLs (2.5 mg total) by nebulization every 2 (two) hours as needed for shortness of breath. 10/01/21   Geradine Girt, DO  albuterol (VENTOLIN HFA) 108 (90 Base) MCG/ACT inhaler Inhale 2 puffs into the lungs every 6 (six) hours as needed for wheezing or shortness of breath (asthma). 08/22/21   Aline August, MD  ALPRAZolam Duanne Moron) 1 MG tablet Take 1 mg by mouth 2 (two) times daily as needed for anxiety. 04/15/17   [provider]  amLODipine (NORVASC) 10 MG tablet TAKE 1 TABLET BY MOUTH ONCE DAILY Patient taking differently: Take 10 mg by mouth daily. 04/13/21   Cantwell, Celeste C, PA-C  ascorbic acid (VITAMIN C) 500 MG tablet Take 1 tablet (500 mg total) by mouth 2 (two) times daily. 10/12/21   Hosie Poisson, MD  atorvastatin (LIPITOR) 80 MG tablet Take 80 mg by mouth at bedtime.    [provider]  azelastine (ASTELIN) 0.1 % nasal spray Place 1 spray into both nostrils daily as needed for allergies (seasonal allergies). 06/24/19   [provider]  calcium  acetate (PHOSLO) 667 MG capsule Take 1 capsule (667 mg total) by mouth 3 (three) times daily with meals. 10/12/21   Hosie Poisson, MD  Calcium Carb-Cholecalciferol (CALCIUM 600 + D PO) Take 1 tablet by mouth daily.    [provider]  cetirizine (ZYRTEC) 10 MG tablet Take 10 mg by mouth daily as needed for allergies.    [provider]  Ensure (ENSURE) Take 237 mLs by mouth 2 (two) times daily between meals.    [provider]  ezetimibe (ZETIA) 10 MG tablet TAKE 1 TABLET BY MOUTH ONCE A DAY Patient taking differently: Take 10 mg by mouth daily. 05/03/21   Cantwell, Celeste C, PA-C  folic acid (FOLVITE) 1 MG tablet Take 1 tablet (1 mg total) by mouth daily. 10/13/21   Hosie Poisson, MD  guaiFENesin (ROBITUSSIN) 100 MG/5ML liquid Take 5 mLs by mouth every 4 (four) hours as needed for cough or to loosen phlegm. 10/12/21   Hosie Poisson, MD  isosorbide mononitrate (IMDUR) 60 MG 24 hr tablet Take 1 tablet (60 mg total) by mouth daily. 10/13/21   Hosie Poisson, MD  LINZESS 72 MCG capsule TAKE 1 CAPSULE BY MOUTH ONCE DAILY BEFORE BREAKFAST Patient taking differently: Take 72 mcg by mouth daily before breakfast. 08/03/21   Levin Erp, PA  losartan (COZAAR) 50 MG tablet Take 1 tablet (50 mg total) by mouth daily. 10/13/21   Hosie Poisson, MD  megestrol (MEGACE) 40 MG tablet Take 40 mg by mouth daily.    [provider]  mometasone-formoterol (DULERA) 100-5 MCG/ACT AERO Take 2 puffs first thing in am and then another 2 puffs about 12 hours later. Patient taking differently: Inhale 2 puffs into the lungs every 12 (twelve) hours. 04/26/17   Tanda Rockers, MD  multivitamin (RENA-VIT) TABS tablet Take 1 tablet by mouth at bedtime. 09/21/21   Mercy Riding, MD  nitroGLYCERIN (NITROSTAT) 0.4 MG SL tablet Place 1 tablet (0.4 mg total) under the tongue every 5 (five) minutes as needed for chest pain. 10/15/19   Miquel Dunn, NP  ondansetron (ZOFRAN-ODT) 4 MG  disintegrating tablet Take 1 tablet (4 mg total) by mouth every 8 (eight) hours as needed for nausea or vomiting. Patient taking differently: Take 4 mg by mouth daily. Alternate with Promethazine 07/25/21   Hayden Rasmussen, MD  oxyCODONE-acetaminophen (PERCOCET/ROXICET) 5-325 MG tablet Take 1 tablet by mouth 2 (two) times daily as needed for pain. 09/13/21 09/13/22  [provider]  pantoprazole (PROTONIX) 40 MG tablet Take 1 tablet (40 mg total) by mouth daily. 08/22/21   Aline August, MD  polyethylene glycol powder (MIRALAX) 17 GM/SCOOP powder Take 17 g by mouth 2 (two) times daily as needed for moderate constipation or mild constipation. 09/21/21   Mercy Riding, MD  predniSONE (DELTASONE) 20 MG tablet Take 2 tablets (40 mg total) by mouth daily with breakfast. 10/02/21   Geradine Girt, DO  promethazine (PHENERGAN) 25 MG tablet Take 25 mg by mouth every 6 (six) hours as needed for nausea or vomiting. Alternate with Ondansetron 08/16/20   [provider]  traZODone (DESYREL) 100 MG tablet Take 50-100 mg by mouth at bedtime as needed for sleep.    [provider]  venlafaxine XR (EFFEXOR XR) 150 MG 24 hr capsule Take 2 capsules (300 mg total) by mouth daily with breakfast. 05/12/21   Arrien, Jimmy Picket, MD  vitamin B-12 (CYANOCOBALAMIN) 500 MCG tablet Take 500 mcg by mouth daily.    [provider]      Allergies    Doxycycline, Hydralazine, Aspirin, Hydrocodone, Ibuprofen, Tylenol [acetaminophen], and Iohexol    Review of Systems   Review of Systems  Constitutional:        Per HPI, otherwise negative  HENT:         Per HPI, otherwise negative  Respiratory:         Per HPI, otherwise negative  Cardiovascular:        Per HPI, otherwise negative  Gastrointestinal:  Negative for vomiting.  Endocrine:       Negative aside from HPI  Genitourinary:        Neg aside from HPI   Musculoskeletal:        Per HPI, otherwise negative  Skin: Negative.    Allergic/Immunologic: Positive for immunocompromised state.  Neurological:  Positive for weakness. Negative for syncope.   Physical Exam Updated Vital Signs BP 124/60 (BP Location: Left Arm)    Pulse 89    Temp 98.2 F (36.8 C) (Oral)    Resp 18    SpO2 100%  Physical Exam  ED Results / Procedures / Treatments   Labs (all labs ordered are listed, but only abnormal results are displayed) Labs Reviewed  CBC WITH DIFFERENTIAL/PLATELET - Abnormal; Notable for the following components:      Result Value   RBC 1.38 (*)    Hemoglobin 4.4 (*)    HCT 14.4 (*)    MCV 104.3 (*)    RDW 19.7 (*)  All other components within normal limits  I-STAT CHEM 8, ED - Abnormal; Notable for the following components:   Sodium 134 (*)    Chloride 95 (*)    BUN 33 (*)    Creatinine, Ser 3.10 (*)    Calcium, Ion 1.11 (*)    Hemoglobin 5.1 (*)    HCT 15.0 (*)    All other components within normal limits  COMPREHENSIVE METABOLIC PANEL  TYPE AND SCREEN    EKG EKG Interpretation  Date/Time:  Wednesday October 18 2021 09:42:21 EST Ventricular Rate:  88 PR Interval:  136 QRS Duration: 74 QT Interval:  356 QTC Calculation: 430 R Axis:   84 Text Interpretation: Normal sinus rhythm Minimal voltage criteria for LVH, may be normal variant ( Sokolow-Lyon ) Artifact Borderline ECG Confirmed by Carmin Muskrat [phone]) on 10/18/2021 10:01:56 AM  Radiology No results found.  Procedures Procedures    Medications Ordered in ED Medications - No data to display  ED Course/ Medical Decision Making/ A&P With concern for ongoing bleed patient was placed on continuous cardiac monitor, pulse oximetry. Cardiac 90s sinus normal Pulse ox 100% 3 L via nasal cannula.  10:54 AM I have discussed her case with both our nephrology colleagues and our gastroenterology colleagues for consultation, assistance with patient care. On reviewing the patient's chart is clear she was just discharged from our facility after  presenting floridly sick.  During that hospitalization she required ICU care, CRRT.  On further chart review the patient has extensive documentation of GI bleed, multiple colonoscopies, chronic anemia and bleeding going back ureters, with multiple GI evaluations, without obvious source identified for her bleeding.  Update: Patient and family member aware of all findings, need for transfusions which she is amenable.  With her ongoing pain in addition to transfusion patient received IV Dilaudid.                         This adult female presents with weakness, near syncope, no fall in the context of ongoing GI bleeding, both melena and bright red blood.  Patient found to have critically abnormal hemoglobin value and as an outpatient, corroborated here hemoglobin 4.4 requiring initiation of transfusion of multiple units of blood.  Patient's abdomen is only tender, no guarding, no distention, and she has known ischemia as well as SMA stent, most likely contributing to her ongoing discomfort.  With multiple prior colonoscopies, CT scan, additional imaging not currently indicated.  Patient required resuscitation with blood as above, continuous monitoring. After discussion with multiple specialists, patient admitted following conversation with our internal medicine colleagues for monitoring, management, transfusion, dialysis.        Final Clinical Impression(s) / ED Diagnoses Final diagnoses:  Acute GI bleeding  Symptomatic anemia   CRITICAL CARE Performed by: Carmin Muskrat Total critical care time: 45 minutes Critical care time was exclusive of separately billable procedures and treating other patients. Critical care was necessary to treat or prevent imminent or life-threatening deterioration. Critical care was time spent personally by me on the following activities: development of treatment plan with patient and/or surrogate as well as nursing, discussions with consultants, evaluation of  patient's response to treatment, examination of patient, obtaining history from patient or surrogate, ordering and performing treatments and interventions, ordering and review of laboratory studies, ordering and review of radiographic studies, pulse oximetry and re-evaluation of patient's condition.    Carmin Muskrat, MD 10/18/21 1057    Carmin Muskrat, MD 10/18/21 1116

## 2021-10-19 DIAGNOSIS — K5520 Angiodysplasia of colon without hemorrhage: Secondary | ICD-10-CM | POA: Diagnosis not present

## 2021-10-19 DIAGNOSIS — G894 Chronic pain syndrome: Secondary | ICD-10-CM | POA: Diagnosis not present

## 2021-10-19 DIAGNOSIS — D649 Anemia, unspecified: Secondary | ICD-10-CM | POA: Diagnosis not present

## 2021-10-19 DIAGNOSIS — K551 Chronic vascular disorders of intestine: Secondary | ICD-10-CM | POA: Diagnosis not present

## 2021-10-19 LAB — TYPE AND SCREEN
ABO/RH(D): O NEG
Antibody Screen: NEGATIVE
Unit division: 0
Unit division: 0

## 2021-10-19 LAB — BPAM RBC
Blood Product Expiration Date: 202301242359
Blood Product Expiration Date: 202301242359
ISSUE DATE / TIME: 202301181110
ISSUE DATE / TIME: 202301181553
Unit Type and Rh: 9500
Unit Type and Rh: 9500

## 2021-10-19 LAB — BASIC METABOLIC PANEL
Anion gap: 13 (ref 5–15)
BUN: 36 mg/dL — ABNORMAL HIGH (ref 6–20)
CO2: 25 mmol/L (ref 22–32)
Calcium: 8.5 mg/dL — ABNORMAL LOW (ref 8.9–10.3)
Chloride: 99 mmol/L (ref 98–111)
Creatinine, Ser: 3.36 mg/dL — ABNORMAL HIGH (ref 0.44–1.00)
GFR, Estimated: 15 mL/min — ABNORMAL LOW (ref >60)
Glucose, Bld: 67 mg/dL — ABNORMAL LOW (ref 70–99)
Potassium: 4.2 mmol/L (ref 3.5–5.1)
Sodium: 137 mmol/L (ref 135–145)

## 2021-10-19 LAB — CBC
HCT: 26.9 % — ABNORMAL LOW (ref 36.0–46.0)
Hemoglobin: 9.1 g/dL — ABNORMAL LOW (ref 12.0–15.0)
MCH: 30.4 pg (ref 26.0–34.0)
MCHC: 33.8 g/dL (ref 30.0–36.0)
MCV: 90 fL (ref 80.0–100.0)
Platelets: 368 10*3/uL (ref 150–400)
RBC: 2.99 MIL/uL — ABNORMAL LOW (ref 3.87–5.11)
RDW: 20.6 % — ABNORMAL HIGH (ref 11.5–15.5)
WBC: 10.4 10*3/uL (ref 4.0–10.5)
nRBC: 0 % (ref 0.0–0.2)

## 2021-10-19 NOTE — Progress Notes (Signed)
Pt receives out-pt HD at [facility] on MWF. Pt arrives at 11:30 for 11:50 chair time. Will assist as needed.  Melven Sartorius Renal Navigator [phone]

## 2021-10-19 NOTE — Progress Notes (Signed)
PROGRESS NOTE    [name]  KZS:[id number] DOB: May 08, 1961 DOA: 10/18/2021 PCP: Bernerd Limbo, MD   Brief Narrative:  [name] is a 61 y.o. female with medical history significant of end-stage renal disease on HD, MWF, CAD, AS, bipolar disorder, chronic mesenteric ischemia, status post SMA aortic and bilateral common iliac stenting, status post colon resection, chronic small bowel AVMs comes in with bright red blood per rectum and melanotic stool for the last 2 days.  Patient has frequent GI bleeds and has required multiple transfusions in the past.  She denies any fevers.  She denies any nausea vomiting.  She has had at least 6-10 bowel movements that have been bloody over the last 48 hours.  She denies any pain.  Patient found to have a hemoglobin of 4.4, 7 days ago was 7.6.  Patient referred for admission for acute GI bleed.    Assessment & Plan:   Principal Problem:   Acute blood loss anemia Active Problems:   Acute GI bleeding   GIB (gastrointestinal bleeding)   Angiodysplasia of colon   Chronic mesenteric ischemia (HCC)   COPD (chronic obstructive pulmonary disease) (HCC)   Chronic pain disorder  Acute blood loss anemia/hematochezia/history of chronic small bowel AVMs and possible ischemic colitis: She has history of  mesenteric ischemia s/p SMA stent. presented with hemoglobin of 4.4.  Received 2 unit appears a transfusion, hemoglobin over 9.  Seen by GI.  It appears that due to recurrent GI bleed, GI does not have any plans to do scopes again.  She has had multiple scopes in the past.  We will monitor.  Awaiting GI to clarify plans.  Per GI recommendations, I have consulted palliative care.  Continue Protonix p.o. 40 mg daily.  ESRD on HD: Nephrology on board.  Essential hypertension: Controlled.  Continue amlodipine.  DVT prophylaxis: SCDs Start: 10/18/21 1220   Code Status: DNR  Family Communication:  None present at bedside.  Plan of care discussed with patient  in length and he/she verbalized understanding and agreed with it.  Status is: Inpatient  Remains inpatient appropriate because: Needs plan clarification from GI.       Estimated body mass index is 16.58 kg/m as calculated from the following:   Height as of 10/04/21: 5\' 1"  (1.549 m).   Weight as of this encounter: 39.8 kg.    Nutritional Assessment: Body mass index is 16.58 kg/m.Marland Kitchen Seen by dietician.  I agree with the assessment and plan as outlined below: Nutrition Status:        . Skin Assessment: I have examined the patient's skin and I agree with the wound assessment as performed by the wound care RN as outlined below:    Consultants:  GI  Procedures:  None  Antimicrobials:  Anti-infectives (From admission, onward)    None         Subjective: Patient seen and examined in dialysis unit.  She complains of abdominal pain which is constant.  She claims that this is 10 out of 10 however patient appears to be extremely comfortable for that amount of pain so I doubt it.  No other complaint.  She has not had any further rectal bleeding since admission.  Objective: Vitals:   10/19/21 1030 10/19/21 1100 10/19/21 1130 10/19/21 1200  BP: (!) 189/90 (!) 186/92 (!) 186/68 (!) 172/73  Pulse: 86 87 91 (!) 157  Resp: (!) 22 18 19 20   Temp:      TempSrc:  SpO2: 100% 100% 100% 100%  Weight:        Intake/Output Summary (Last 24 hours) at 10/19/2021 1206 Last data filed at 10/18/2021 2300 Gross per 24 hour  Intake 804 ml  Output 200 ml  Net 604 ml   Filed Weights   10/19/21 0910  Weight: 39.8 kg    Examination:  General exam: Appears calm and comfortable  Respiratory system: Clear to auscultation. Respiratory effort normal. Cardiovascular system: S1 & S2 heard, RRR. No JVD, murmurs, rubs, gallops or clicks. No pedal edema. Gastrointestinal system: Abdomen is nondistended, soft and very mild umbilical tenderness. No organomegaly or masses felt. Normal bowel  sounds heard. Central nervous system: Alert and oriented. No focal neurological deficits. Extremities: Symmetric 5 x 5 power. Skin: No rashes, lesions or ulcers Psychiatry: Judgement and insight appear normal. Mood & affect flat   Data Reviewed: I have personally reviewed following labs and imaging studies  CBC: Recent Labs  Lab 10/18/21 0936 10/18/21 0944 10/18/21 2219 10/19/21 0409  WBC 9.3  --   --  10.4  NEUTROABS 7.7  --   --   --   HGB 4.4* 5.1* 9.4* 9.1*  HCT 14.4* 15.0* 27.4* 26.9*  MCV 104.3*  --   --  90.0  PLT 381  --   --  591   Basic Metabolic Panel: Recent Labs  Lab 10/18/21 0936 10/18/21 0944 10/19/21 0108  NA 135 134* 137  K 4.1 4.1 4.2  CL 98 95* 99  CO2 27  --  25  GLUCOSE 101* 99 67*  BUN 34* 33* 36*  CREATININE 2.91* 3.10* 3.36*  CALCIUM 7.9*  --  8.5*   GFR: Estimated Creatinine Clearance: 11.3 mL/min (A) (by C-G formula based on SCr of 3.36 mg/dL (H)). Liver Function Tests: Recent Labs  Lab 10/18/21 0936  AST 28  ALT 27  ALKPHOS 80  BILITOT 0.2*  PROT 5.1*  ALBUMIN 2.3*   No results for input(s): LIPASE, AMYLASE in the last 168 hours. No results for input(s): AMMONIA in the last 168 hours. Coagulation Profile: No results for input(s): INR, PROTIME in the last 168 hours. Cardiac Enzymes: No results for input(s): CKTOTAL, CKMB, CKMBINDEX, TROPONINI in the last 168 hours. BNP (last 3 results) No results for input(s): PROBNP in the last 8760 hours. HbA1C: No results for input(s): HGBA1C in the last 72 hours. CBG: No results for input(s): GLUCAP in the last 168 hours. Lipid Profile: No results for input(s): CHOL, HDL, LDLCALC, TRIG, CHOLHDL, LDLDIRECT in the last 72 hours. Thyroid Function Tests: No results for input(s): TSH, T4TOTAL, FREET4, T3FREE, THYROIDAB in the last 72 hours. Anemia Panel: No results for input(s): VITAMINB12, FOLATE, FERRITIN, TIBC, IRON, RETICCTPCT in the last 72 hours. Sepsis Labs: No results for input(s):  PROCALCITON, LATICACIDVEN in the last 168 hours.  Recent Results (from the past 240 hour(s))  Culture, body fluid w Gram Stain-bottle     Status: None   Collection Time: 10/09/21  3:16 PM   Specimen: Fluid  Result Value Ref Range Status   Specimen Description FLUID PLEURAL  Final   Special Requests BOTTLES DRAWN AEROBIC AND ANAEROBIC  Final   Culture   Final    NO GROWTH 5 DAYS Performed at [facility], [place] [address], [place], [place] [address]    Report Status 10/14/2021 FINAL  Final  Gram stain     Status: None   Collection Time: 10/09/21  3:16 PM   Specimen: Fluid  Result  Value Ref Range Status   Specimen Description FLUID PLEURAL  Final   Special Requests NONE  Final   Gram Stain   Final    FEW WBC PRESENT,BOTH PMN AND MONONUCLEAR NO ORGANISMS SEEN Performed at [facility], [address] [place], [place] [address]    Report Status 10/10/2021 FINAL  Final     Radiology Studies: No results found.  Scheduled Meds:  amLODipine  10 mg Oral Daily   Chlorhexidine Gluconate Cloth  6 each Topical Q0600   megestrol  40 mg Oral Daily   mometasone-formoterol  2 puff Inhalation Q12H   pantoprazole  40 mg Oral Daily   sodium chloride flush  3 mL Intravenous Q12H   venlafaxine XR  300 mg Oral Q breakfast   Continuous Infusions:  sodium chloride     sodium chloride     sodium chloride       LOS: 1 day   Time spent: 35 minutes  Darliss Cheney, MD [facility]  10/19/2021, 12:06 PM  Please page via Shea Evans and do not message via secure chat for anything urgent. Secure chat can be used for anything non urgent.  How to contact the [facility] Attending or Consulting provider [employer] or covering provider during after hours [place], for this patient?  Check the care team in [facility] and look for a) attending/consulting [facility] provider listed and b) the [facility] team listed. Page or secure chat [phone]. Log into [email or web address] and use [place]'s universal password to access. If you  do not have the password, please contact the hospital operator. Locate the [facility] provider you are looking for under [facility] and page to a number that you can be directly reached. If you still have difficulty reaching the provider, please page the [facility] (Director on Call) for the Hospitalists listed on amion for assistance.

## 2021-10-19 NOTE — Progress Notes (Addendum)
Progress Note   Subjective  Chief Complaint: hematochezia, anemia  Patient getting dialysis with blood when seen. His last bowel movement was yesterday morning, did have bright red blood with bowel movement, states moderate amount. Denies any further bowel movements. Has nausea without vomiting, has diffuse unchanged abdominal discomfort. Patient tearful and states she is very tired of doing this and frustrated that there is not a clear source for bleeding.    Objective   Vital signs in last 24 hours: Temp:  [97.5 F (36.4 C)-98.7 F (37.1 C)] 97.5 F (36.4 C) (01/19 0910) Pulse Rate:  [80-91] 86 (01/19 1030) Resp:  [13-30] 22 (01/19 1030) BP: (133-189)/(61-90) 189/90 (01/19 1030) SpO2:  [95 %-100 %] 100 % (01/19 1030) Weight:  [39.8 kg] 39.8 kg (01/19 0910)    General: Patient is cachectic, chronically/severely ill appearing with ashen coloring Lungs: CTAB Heart: RRR.  No MRG.   Abdomen: Soft without distention.  Bowel sounds present. Tenderness throughout AB, no rebound. Extremities: 1+ pedal edema.  Feet are warm.  LUE AV fistula. Neurologic: Alert.  Oriented x3.  Psych: Anxious, tearful.  Overall cooperative.  Intake/Output from previous day: 01/18 0701 - 01/19 0700 In: 804 [Blood:804] Out: 200 [Urine:200] Intake/Output this shift: No intake/output data recorded.  Lab Results: Recent Labs    10/18/21 0936 10/18/21 0944 10/18/21 2219 10/19/21 0409  WBC 9.3  --   --  10.4  HGB 4.4* 5.1* 9.4* 9.1*  HCT 14.4* 15.0* 27.4* 26.9*  PLT 381  --   --  368   BMET Recent Labs    10/18/21 0936 10/18/21 0944 10/19/21 0108  NA 135 134* 137  K 4.1 4.1 4.2  CL 98 95* 99  CO2 27  --  25  GLUCOSE 101* 99 67*  BUN 34* 33* 36*  CREATININE 2.91* 3.10* 3.36*  CALCIUM 7.9*  --  8.5*   LFT Recent Labs    10/18/21 0936  PROT 5.1*  ALBUMIN 2.3*  AST 28  ALT 27  ALKPHOS 80  BILITOT 0.2*   PT/INR No results for input(s): LABPROT, INR in the last 72  hours.  Studies/Results: No results found.    Impression/Plan:   Hematochezia and patient with chronic anemia, end-stage renal disease on dialysis, that is undergone multiple upper and lower endoscopies, including capsule endoscopies over the past few years.  Continue Protonix 40 mg daily Patient has not had another bowel movement since yesterday evening, hemoglobin appears stable.  At this time with extensive previous work-up most recent endoscopy evaluation past 3 months, continue conservative, supportive treatment.  Recommendations per Dr. Silverio Decamp.   Acute on chronic anemia of chronic kidney disease.   Outpt meds include Q65, Folic acid.  Per recent dialysis note she recently received Venofer and might start ESA.  WBC 10.4 HGB 9.1 MCV 90.0 Platelets 368 Iron 60 Ferritin 1,304   ESRD.  HD MWF.  Getting HD today    Periph artery disease, mesenteric ischemia.  11/2020 SMA, aortic, bilateral common iliac stenting.   Bipolar d/O, hx schizophrenia.   Significant anxiety currently.     Chronic MS and Abd pain.   Prn percocet up to 2 x daily.     COPD w recent flares.   Med list from 1/12 discharge includes Prednisone 40 mg daily (no mention of taper).      Future Appointments  Date Time Provider [place]  11/22/2021  8:50 AM Milus Banister, MD [facility]  LOS: 1 day   Vladimir Crofts  10/19/2021, 11:13 AM   Attending physician's note   I have taken a history, reviewed the chart and examined the patient. I performed a substantive portion of this encounter, including complete performance of at least one of the key components, in conjunction with the APP. I agree with the APP's note, impression and recommendations.    Patient says she is tired of undergoing dialysis and she cannot handle it anymore She feels she did put up a good 5 but she does not want to continue dealing with her health problems.  She is interested in meeting with palliative care to  discuss goals of care.  Hemoglobin increased from 4.4 to 9 with just 2 units PRBC, likely there was a component of volume shift No evidence of active GI hemorrhage No plan for repeat endoscopic evaluation at this point  GI will sign off but available if needed, please call with any questions  The patient was provided an opportunity to ask questions and all were answered. The patient agreed with the plan and demonstrated an understanding of the instructions.   Damaris Hippo , MD [phone]

## 2021-10-19 NOTE — Progress Notes (Signed)
[facility] Progress Note   Interval/subjective history: Patient continues to feel poorly today.  Overall just feels very tired and having some abdominal pain.  Tolerating dialysis this morning.   Objective Vitals:   10/19/21 0806 10/19/21 0910 10/19/21 1000 10/19/21 1030  BP: (!) 186/83 (!) 175/85 (!) 185/86 (!) 189/90  Pulse: 84  86 86  Resp: 18 (!) 30 17 (!) 22  Temp: 98.2 F (36.8 C) (!) 97.5 F (36.4 C)    TempSrc: Oral     SpO2: 98% 99% 100% 100%  Weight:  39.8 kg     Physical Exam General: Chronically ill appearing, thin, nad  Heart: Normal rate, no obvious murmurs Lungs: Bilateral chest rise with no increased work of breathing Abdomen: soft, non-distended, tender to light palpation in multiple areas Extremities: No LE edema.,  Warm and well perfused Dialysis Access: RIJ TDC drsg intact. L AVG +bruit    Additional Objective Labs: Basic Metabolic Panel: Recent Labs  Lab 10/18/21 0936 10/18/21 0944 10/19/21 0108  NA 135 134* 137  K 4.1 4.1 4.2  CL 98 95* 99  CO2 27  --  25  GLUCOSE 101* 99 67*  BUN 34* 33* 36*  CREATININE 2.91* 3.10* 3.36*  CALCIUM 7.9*  --  8.5*   Liver Function Tests: Recent Labs  Lab 10/18/21 0936  AST 28  ALT 27  ALKPHOS 80  BILITOT 0.2*  PROT 5.1*  ALBUMIN 2.3*   No results for input(s): LIPASE, AMYLASE in the last 168 hours. CBC: Recent Labs  Lab 10/18/21 0936 10/18/21 0944 10/18/21 2219 10/19/21 0409  WBC 9.3  --   --  10.4  NEUTROABS 7.7  --   --   --   HGB 4.4* 5.1* 9.4* 9.1*  HCT 14.4* 15.0* 27.4* 26.9*  MCV 104.3*  --   --  90.0  PLT 381  --   --  368   Blood Culture    Component Value Date/Time   SDES FLUID PLEURAL 10/09/2021 1516   SDES FLUID PLEURAL 10/09/2021 1516   SPECREQUEST BOTTLES DRAWN AEROBIC AND ANAEROBIC 10/09/2021 1516   SPECREQUEST NONE 10/09/2021 1516   CULT  10/09/2021 1516    NO GROWTH 5 DAYS Performed at [facility], [place] [address], [place], [place] [address]     REPTSTATUS 10/14/2021 FINAL 10/09/2021 1516   REPTSTATUS 10/10/2021 FINAL 10/09/2021 1516    Cardiac Enzymes: No results for input(s): CKTOTAL, CKMB, CKMBINDEX, TROPONINI in the last 168 hours. CBG: No results for input(s): GLUCAP in the last 168 hours. Iron Studies: No results for input(s): IRON, TIBC, TRANSFERRIN, FERRITIN in the last 72 hours. @lablastinr3 @ Studies/Results: No results found. Medications:  sodium chloride     sodium chloride     sodium chloride      amLODipine  10 mg Oral Daily   Chlorhexidine Gluconate Cloth  6 each Topical Q0600   megestrol  40 mg Oral Daily   mometasone-formoterol  2 puff Inhalation Q12H   pantoprazole  40 mg Oral Daily   sodium chloride flush  3 mL Intravenous Q12H   venlafaxine XR  300 mg Oral Q breakfast     OP HD: MWF GO   3h 8min  36.5kg   3K/2.5 bath  Hep none  TDC/ LUA AVG done 09/14/21  - calcitriol 0.5 mcg po tiw  - mircera 75 mcg on 09/15/21     Assessment/Plan:  Hematochezia/ABLA   - Hgb 4.4 on admission s/p 2 units. GI with  no plans for further intervention at this time ESRD - HD MWF. Hd today and likely plan for HD again on Saturday. Resume MWF schedule next week. Access --Using AVG at OP center -will cannulate here. Has tdc in place HTN/volume  - Continue home meds. UF to EDW as tolerated.  Anemia of CKD - Got Aranesp 150 on 10/11/21. 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 Current phos at goal so can hold binders. Continue calcitriol. COPD -Unsure of her baseline respiratory status but suspect she has limited reserve. GOC: She has a lot of pain and overall debilitation.  Agree with palliative care involvement to help balance goals regarding comfort and medical care.   [name]  10/19/2021,11:01 AM

## 2021-10-19 NOTE — Progress Notes (Signed)
Pt refusing to wear her tele even after being advised still will not wear MD notfied

## 2021-10-19 NOTE — Progress Notes (Signed)
PT Cancellation Note  Patient Details Name: [name] MRN: [id number] DOB: [birth year]   Cancelled Treatment:    Reason Eval/Treat Not Completed: Patient at procedure or test/unavailable Pt currently in HD. Will follow up as schedule allows.   Lou Miner, DPT  Acute Rehabilitation Services  Pager: [phone] Office: [phone]    Rudean Hitt 10/19/2021, 12:13 PM

## 2021-10-19 NOTE — Progress Notes (Signed)
OT Cancellation Note  Patient Details Name: [name] MRN: [id number] DOB: June 13, 1961   Cancelled Treatment:    Reason Eval/Treat Not Completed: Patient at procedure or test/ unavailable- currently in HD. Will follow up as able.   Jolaine Artist, OT Acute Rehabilitation Services Pager [phone] Office [phone]   Delight Stare 10/19/2021, 12:37 PM

## 2021-10-19 NOTE — Evaluation (Signed)
Occupational Therapy Evaluation Patient Details Name: [name] MRN: [id number] DOB: 20-Jan-1961 Today's Date: 10/19/2021   History of Present Illness Pt is a 61 y.o. female admitted 10/18/21 with GIB. Recent admissions for HTN urgency, flash pulmonary edema; pt required ICU admission for nitro drip, also for volume overload and COPD exacerbation. Palliative Care has been consulted this admission. PMH includes ESRD (HD MWF), HTN, HLD, COPD (wears 2L O2 baseline), bipolar disorder, chronic pain.   Clinical Impression   Pt was furniture walking and had assistance for bathing by her granddaughter as needed. She stated as soon as OT walked in the room that she did not want to wear telemetry, "Whats's the point, I'm going to die anyway," RN notified. Pt presents with generalized weakness and mild balance deficits. She needs up to min assist for OOB mobility due to rushing with urgency to use bathroom and weakness. Daughter in room and tearful, sent chat to MD to call her at her request. No further OT needs.      Recommendations for follow up therapy are one component of a multi-disciplinary discharge planning process, led by the attending physician.  Recommendations may be updated based on patient status, additional functional criteria and insurance authorization.   Follow Up Recommendations  No OT follow up (pt requesting palliative care/hospice)    Assistance Recommended at Discharge Frequent or constant Supervision/Assistance  Patient can return home with the following A little help with walking and/or transfers;A little help with bathing/dressing/bathroom;Assistance with cooking/housework;Help with stairs or ramp for entrance;Assist for transportation    Functional Status Assessment  Patient has had a recent decline in their functional status and/or demonstrates limited ability to make significant improvements in function in a reasonable and predictable amount of time  Equipment  Recommendations  BSC/3in1    Recommendations for Other Services       Precautions / Restrictions Precautions Precautions: Fall Precaution Comments: 2L 02 at baseline Restrictions Weight Bearing Restrictions: No      Mobility Bed Mobility Overal bed mobility: Modified Independent             General bed mobility comments: HOB up    Transfers Overall transfer level: Needs assistance   Transfers: Sit to/from Stand Sit to Stand: Min guard           General transfer comment: min guard, becomes more unsafe when rushing to bathroom      Balance Overall balance assessment: Needs assistance, History of Falls Sitting-balance support: Feet supported, No upper extremity supported Sitting balance-Leahy Scale: Good     Standing balance support: During functional activity, No upper extremity supported Standing balance-Leahy Scale: Fair Standing balance comment: poor dynamic, fair static                           ADL either performed or assessed with clinical judgement   ADL Overall ADL's : Needs assistance/impaired Eating/Feeding: Independent;Sitting   Grooming: Wash/dry hands;Standing;Min guard   Upper Body Bathing: Sitting;Supervision/ safety   Lower Body Bathing: Minimal assistance;Sit to/from stand   Upper Body Dressing : Set up;Sitting   Lower Body Dressing: Minimal assistance   Toilet Transfer: Minimal assistance;Ambulation;BSC/3in1   Toileting- Water quality scientist and Hygiene: Min guard;Sit to/from stand       Functional mobility during ADLs: Minimal assistance       Vision Baseline Vision/History: 1 Wears glasses Ability to See in Adequate Light: 0 Adequate       Perception  Praxis      Pertinent Vitals/Pain Pain Assessment Pain Assessment: No/denies pain     Hand Dominance Right   Extremity/Trunk Assessment Upper Extremity Assessment Upper Extremity Assessment: Generalized weakness   Lower Extremity  Assessment Lower Extremity Assessment: Defer to PT evaluation   Cervical / Trunk Assessment Cervical / Trunk Assessment: Kyphotic   Communication Communication Communication: No difficulties   Cognition Arousal/Alertness: Awake/alert Behavior During Therapy: WFL for tasks assessed/performed, Impulsive Overall Cognitive Status: Impaired/Different from baseline                           Safety/Judgement: Decreased awareness of safety     General Comments: pt refusing telemetry, RN notified, pt stating she was told there was nothing else that could be done for her     General Comments       Exercises     Shoulder Instructions      Home Living Family/patient expects to be discharged to:: Private residence Living Arrangements: Other relatives Available Help at Discharge: Family;Available PRN/intermittently Type of Home: House Home Access: Stairs to enter [employer] of Steps: 5-6 Entrance Stairs-Rails: Right Home Layout: One level     Bathroom Shower/Tub: Teacher, early years/pre: Standard     Home Equipment: Cane - single point;Shower seat;Rollator (4 wheels)          Prior Functioning/Environment Prior Level of Function : Independent/Modified Independent             Mobility Comments: furniture walks or has assist of family ADLs Comments: Sits on edge of tub or toilet to perform bird bathing at sink, granddaughter assists if needed. Granddaughter also assists with med management        OT Problem List:        OT Treatment/Interventions:      OT Goals(Current goals can be found in the care plan section)    OT Frequency:      Co-evaluation              AM-PAC OT "6 Clicks" Daily Activity     Outcome Measure Help from another person eating meals?: None Help from another person taking care of personal grooming?: A Little Help from another person toileting, which includes using toliet, bedpan, or urinal?: A  Little Help from another person bathing (including washing, rinsing, drying)?: A Little Help from another person to put on and taking off regular upper body clothing?: None Help from another person to put on and taking off regular lower body clothing?: A Little 6 Click Score: 20   End of Session Equipment Utilized During Treatment: Gait belt Nurse Communication: Other (comment) (refusing telemetry)  Activity Tolerance: Patient limited by fatigue Patient left: in chair;with call bell/phone within reach;with chair alarm set;with family/visitor present  OT Visit Diagnosis: Unsteadiness on feet (R26.81)                Time: 1435-1450 OT Time Calculation (min): 15 min Charges:  OT General Charges $OT Visit: 1 Visit OT Evaluation $OT Eval Low Complexity: 1 Low  Nestor Lewandowsky, OTR/L Acute Rehabilitation Services Pager: [phone] Office: [phone]  Malka So 10/19/2021, 3:16 PM

## 2021-10-19 NOTE — Evaluation (Addendum)
I agree with the following treatment note after review of the documentation. This session was performed under the supervision of a licensed clinician.   Lou Miner, DPT  Acute Rehabilitation Services  Pager: [phone]    Physical Therapy Evaluation Patient Details Name: ENZLEY KITCHENS MRN: [id number] DOB: 05-Jul-1961 Today's Date: 10/19/2021  History of Present Illness  Pt is a 61 y.o. female admitted with GI Bleed.  Pt requesting consult with palliative care. PMH includes ESRD (HD MWF), HTN, HLD, COPD (wears 2L O2 baseline), bipolar disorder, chronic pain.   Clinical Impression  Pt presents with diagnosis above and impairments below. Pt able to perform transfers and gait at a mod I to supervision level. Pt tearful during session and wanting to pursue palliative services; MD notified. Secondary to decision to pursue palliative care, pt requesting for PT to sign off. Will need DME below. Please re-consult if needs change.     Recommendations for follow up therapy are one component of a multi-disciplinary discharge planning process, led by the attending physician.  Recommendations may be updated based on patient status, additional functional criteria and insurance authorization.  Follow Up Recommendations No PT follow up (pt requesting palliative services)    Assistance Recommended at Discharge PRN  Patient can return home with the following  A little help with walking and/or transfers;A little help with bathing/dressing/bathroom;Assistance with cooking/housework;Assist for transportation;Help with stairs or ramp for entrance;Direct supervision/assist for medications management;Direct supervision/assist for financial management    Equipment Recommendations BSC/3in1  Recommendations for Other Services  Other (comment) (palliative consult)    Functional Status Assessment Patient has had a recent decline in their functional status and demonstrates the ability to make significant  improvements in function in a reasonable and predictable amount of time.     Precautions / Restrictions Precautions Precautions: Fall Precaution Comments: Monitor O2 levels Restrictions Weight Bearing Restrictions: No      Mobility  Bed Mobility               General bed mobility comments: Sitting in chair    Transfers Overall transfer level: Modified independent                      Ambulation/Gait Ambulation/Gait assistance: Supervision Gait Distance (Feet): 30 Feet Assistive device: None Gait Pattern/deviations: Step-through pattern Gait velocity: decreased     General Gait Details: Supervision for safety. No overt LOB noted. Educated about using rollator for safety with mobility at home.  Stairs            Wheelchair Mobility    Modified Rankin (Stroke Patients Only)       Balance Overall balance assessment: Needs assistance Sitting-balance support: Feet supported, No upper extremity supported Sitting balance-Leahy Scale: Good     Standing balance support: During functional activity, No upper extremity supported Standing balance-Leahy Scale: Good                               Pertinent Vitals/Pain Pain Assessment Pain Intervention(s): Limited activity within patient's tolerance, Monitored during session    [place] expects to be discharged to:: Private residence Living Arrangements: Other relatives Available Help at Discharge: Family;Available PRN/intermittently Type of Home: House Home Access: Stairs to enter Entrance Stairs-Rails: Right Entrance Stairs-Number of Steps: 5-6   Home Layout: One level Home Equipment: Cane - single point;Rollator (4 wheels);Shower seat Additional Comments: Lives with grandson and boyfriend who can assist  her.    Prior Function Prior Level of Function : Needs assist       Physical Assist : ADLs (physical)     Mobility Comments: furniture walks or has assist of  family ADLs Comments: Sits on edge of tub or toilet to perform bird bathing at sink, granddaughter assists if needed. Granddaughter also assists with med management     Hand Dominance   Dominant Hand: Right    Extremity/Trunk Assessment   Upper Extremity Assessment Upper Extremity Assessment: Defer to OT evaluation    Lower Extremity Assessment Lower Extremity Assessment: Overall WFL for tasks assessed    Cervical / Trunk Assessment Cervical / Trunk Assessment: Kyphotic  Communication   Communication: No difficulties  Cognition  Arousal/Alertness: Awake/alert Behavior During Therapy: WFL for tasks assessed/performed Overall Cognitive Status: Within Functional Limits for tasks assessed                                              General Comments General comments (skin integrity, edema, etc.): Pt's daughter present and both she and daughter tearful. Requesting palliative services; MD notified.    Exercises     Assessment/Plan    PT Assessment Patient does not need any further PT services  PT Problem List         PT Treatment Interventions      PT Goals (Current goals can be found in the Care Plan section)  Acute Rehab PT Goals Patient Stated Goal: return home, consult with palliative care PT Goal Formulation: With patient/family Time For Goal Achievement: 10/19/21 Potential to Achieve Goals: Good    Frequency       Co-evaluation               AM-PAC PT "6 Clicks" Mobility  Outcome Measure Help needed turning from your back to your side while in a flat bed without using bedrails?: None Help needed moving from lying on your back to sitting on the side of a flat bed without using bedrails?: None Help needed moving to and from a bed to a chair (including a wheelchair)?: None Help needed standing up from a chair using your arms (e.g., wheelchair or bedside chair)?: None Help needed to walk in hospital room?: A Little Help needed  climbing 3-5 steps with a railing? : A Little 6 Click Score: 22    End of Session Equipment Utilized During Treatment: Oxygen Activity Tolerance: No increased pain;Patient tolerated treatment well Patient left: with call bell/phone within reach;in chair Nurse Communication: Mobility status;Other (comment) (pt requesting palliative services) PT Visit Diagnosis: Other abnormalities of gait and mobility (R26.89)    Time: 8502-7741 PT Time Calculation (min) (ACUTE ONLY): 15 min   Charges:   PT Evaluation $PT Eval Moderate Complexity: 1 Mod          Quenton Fetter, SPT   [place] Khayden Herzberg 10/19/2021, 4:38 PM

## 2021-10-20 LAB — CBC WITH DIFFERENTIAL/PLATELET
Abs Immature Granulocytes: 0.02 10*3/uL (ref 0.00–0.07)
Basophils Absolute: 0.1 10*3/uL (ref 0.0–0.1)
Basophils Relative: 1 %
Eosinophils Absolute: 0.2 10*3/uL (ref 0.0–0.5)
Eosinophils Relative: 2 %
HCT: 26 % — ABNORMAL LOW (ref 36.0–46.0)
Hemoglobin: 8.8 g/dL — ABNORMAL LOW (ref 12.0–15.0)
Immature Granulocytes: 0 %
Lymphocytes Relative: 8 %
Lymphs Abs: 0.7 10*3/uL (ref 0.7–4.0)
MCH: 31.2 pg (ref 26.0–34.0)
MCHC: 33.8 g/dL (ref 30.0–36.0)
MCV: 92.2 fL (ref 80.0–100.0)
Monocytes Absolute: 0.7 10*3/uL (ref 0.1–1.0)
Monocytes Relative: 8 %
Neutro Abs: 7.3 10*3/uL (ref 1.7–7.7)
Neutrophils Relative %: 81 %
Platelets: 344 10*3/uL (ref 150–400)
RBC: 2.82 MIL/uL — ABNORMAL LOW (ref 3.87–5.11)
RDW: 19.3 % — ABNORMAL HIGH (ref 11.5–15.5)
WBC: 9.1 10*3/uL (ref 4.0–10.5)
nRBC: 0 % (ref 0.0–0.2)

## 2021-10-20 LAB — HEPATITIS B SURFACE ANTIBODY, QUANTITATIVE: Hep B S AB Quant (Post): 4.1 m[IU]/mL — ABNORMAL LOW (ref >9.9)

## 2021-10-20 MED ORDER — TRAMADOL HCL 50 MG PO TABS
50.0000 mg | ORAL_TABLET | Freq: Once | ORAL | Status: AC
  Administered 2021-10-20: 50 mg via ORAL
  Filled 2021-10-20: qty 1

## 2021-10-20 NOTE — Progress Notes (Signed)
Mobility Specialist Progress Note:   10/20/21 1120  Mobility  Activity Ambulated independently in room  Level of Assistance Independent  Assistive Device None  Distance Ambulated (ft) 100 ft  Activity Response Tolerated well  $Mobility charge 1 Mobility   Pt in better spirits today. Agreed to ambulate in room. No AD used this am, pt displayed increased steadiness. Pt back in bed with family members present for palliative meeting.  Nelta Numbers Mobility Specialist  Phone [phone]

## 2021-10-20 NOTE — Plan of Care (Signed)
  Problem: Safety: Goal: Ability to remain free from injury will improve Outcome: Progressing   

## 2021-10-20 NOTE — Care Management Important Message (Signed)
Important Message  Patient Details  Name: [name] MRN: [id number] Date of Birth: October 04, 1960   Medicare Important Message Given:  Yes     Hannah Beat 10/20/2021, 1:49 PM

## 2021-10-20 NOTE — Progress Notes (Signed)
PROGRESS NOTE    [name]  YQI:[id number] DOB: 20-Feb-1961 DOA: 10/18/2021 PCP: Bernerd Limbo, MD   Brief Narrative:  [name] is a 61 y.o. female with medical history significant of end-stage renal disease on HD, MWF, CAD, AS, bipolar disorder, chronic mesenteric ischemia, status post SMA aortic and bilateral common iliac stenting, status post colon resection, chronic small bowel AVMs comes in with bright red blood per rectum and melanotic stool for the last 2 days.  Patient has frequent GI bleeds and has required multiple transfusions in the past.  She denies any fevers.  She denies any nausea vomiting.  She has had at least 6-10 bowel movements that have been bloody over the last 48 hours.  She denies any pain.  Patient found to have a hemoglobin of 4.4, 7 days ago was 7.6.  Patient referred for admission for acute GI bleed.    Assessment & Plan:   Principal Problem:   Acute blood loss anemia Active Problems:   Acute GI bleeding   GIB (gastrointestinal bleeding)   Angiodysplasia of colon   Chronic mesenteric ischemia (HCC)   Symptomatic anemia   COPD (chronic obstructive pulmonary disease) (HCC)   Chronic pain disorder  Acute blood loss anemia/hematochezia/history of chronic small bowel AVMs and possible ischemic colitis: She has history of  mesenteric ischemia s/p SMA stent. presented with hemoglobin of 4.4.  Received 2 unit appears a transfusion, hemoglobin 8.8.  Seen by GI.  It appears that due to recurrent GI bleed, GI does not have any plans to do scopes again.  She has had multiple scopes in the past.  We will monitor.   Per GI recommendations, I have consulted palliative care.  Continue Protonix p.o. 40 mg daily.  ESRD on HD: Nephrology on board.  She tells me that she is tired of having dialysis 3 times a week.  She would want to continue dialysis however she is wondering if this can be done only twice a week.  After talking to her nephrologist, they said there is  no guarantee however they would talk to the outpatient dialysis unit director to see if this is possible.  Essential hypertension: Controlled.  Continue amlodipine.  GOC: I was informed that patient has decided to go home with hospice however during my personal conversation with the patient, she says that she is not ready for hospice but she would like to have palliative care at home and she would like to have a discussion with palliative care and would like to have family members present as well.  Relative care has been consulted, we are waiting for them to see this patient and formulate the plan before we discharge her.  DVT prophylaxis: SCDs Start: 10/18/21 1220   Code Status: DNR  Family Communication: Boyfriend present at bedside.  Plan of care discussed with patient in length and he/she verbalized understanding and agreed with it.  Status is: Inpatient  Remains inpatient appropriate because: Needs to meet with palliative care.       Estimated body mass index is 16.12 kg/m as calculated from the following:   Height as of 10/04/21: 5\' 1"  (1.549 m).   Weight as of this encounter: 38.7 kg.    Nutritional Assessment: Body mass index is 16.12 kg/m.Marland Kitchen Seen by dietician.  I agree with the assessment and plan as outlined below: Nutrition Status:        . Skin Assessment: I have examined the patient's skin and I agree with the wound  assessment as performed by the wound care RN as outlined below:    Consultants:  GI  Procedures:  None  Antimicrobials:  Anti-infectives (From admission, onward)    None         Subjective: Patient seen and examined.  Sitting in the bed eating her breakfast.  She has no complaints.  Boyfriend at the bedside.  Objective: Vitals:   10/19/21 2034 10/20/21 0530 10/20/21 0820 10/20/21 0826  BP: (!) 133/92 (!) 155/75 (!) 150/78   Pulse: (!) 102 100 (!) 106   Resp: 17 16 19    Temp: 99.8 F (37.7 C) 98.4 F (36.9 C) 98.3 F (36.8 C)    TempSrc: Oral Oral Oral   SpO2: 100% 96% 99% 97%  Weight:        Intake/Output Summary (Last 24 hours) at 10/20/2021 1418 Last data filed at 10/20/2021 0820 Gross per 24 hour  Intake 460 ml  Output 0 ml  Net 460 ml    Filed Weights   10/19/21 0910 10/19/21 1315  Weight: 39.8 kg 38.7 kg    Examination:  General exam: Appears calm and comfortable  Respiratory system: Clear to auscultation. Respiratory effort normal. Cardiovascular system: S1 & S2 heard, RRR. No JVD, murmurs, rubs, gallops or clicks. No pedal edema. Gastrointestinal system: Abdomen is nondistended, soft and nontender. No organomegaly or masses felt. Normal bowel sounds heard. Central nervous system: Alert and oriented. No focal neurological deficits. Extremities: Symmetric 5 x 5 power. Skin: No rashes, lesions or ulcers.  Psychiatry: Judgement and insight appear normal. Mood & affect appropriate.   Data Reviewed: I have personally reviewed following labs and imaging studies  CBC: Recent Labs  Lab 10/18/21 0936 10/18/21 0944 10/18/21 2219 10/19/21 0409 10/20/21 1022  WBC 9.3  --   --  10.4 9.1  NEUTROABS 7.7  --   --   --  7.3  HGB 4.4* 5.1* 9.4* 9.1* 8.8*  HCT 14.4* 15.0* 27.4* 26.9* 26.0*  MCV 104.3*  --   --  90.0 92.2  PLT 381  --   --  368 465    Basic Metabolic Panel: Recent Labs  Lab 10/18/21 0936 10/18/21 0944 10/19/21 0108  NA 135 134* 137  K 4.1 4.1 4.2  CL 98 95* 99  CO2 27  --  25  GLUCOSE 101* 99 67*  BUN 34* 33* 36*  CREATININE 2.91* 3.10* 3.36*  CALCIUM 7.9*  --  8.5*    GFR: Estimated Creatinine Clearance: 11 mL/min (A) (by C-G formula based on SCr of 3.36 mg/dL (H)). Liver Function Tests: Recent Labs  Lab 10/18/21 0936  AST 28  ALT 27  ALKPHOS 80  BILITOT 0.2*  PROT 5.1*  ALBUMIN 2.3*    No results for input(s): LIPASE, AMYLASE in the last 168 hours. No results for input(s): AMMONIA in the last 168 hours. Coagulation Profile: No results for input(s): INR,  PROTIME in the last 168 hours. Cardiac Enzymes: No results for input(s): CKTOTAL, CKMB, CKMBINDEX, TROPONINI in the last 168 hours. BNP (last 3 results) No results for input(s): PROBNP in the last 8760 hours. HbA1C: No results for input(s): HGBA1C in the last 72 hours. CBG: No results for input(s): GLUCAP in the last 168 hours. Lipid Profile: No results for input(s): CHOL, HDL, LDLCALC, TRIG, CHOLHDL, LDLDIRECT in the last 72 hours. Thyroid Function Tests: No results for input(s): TSH, T4TOTAL, FREET4, T3FREE, THYROIDAB in the last 72 hours. Anemia Panel: No results for input(s): VITAMINB12, FOLATE, FERRITIN, TIBC, IRON,  RETICCTPCT in the last 72 hours. Sepsis Labs: No results for input(s): PROCALCITON, LATICACIDVEN in the last 168 hours.  No results found for this or any previous visit (from the past 240 hour(s)).    Radiology Studies: No results found.  Scheduled Meds:  amLODipine  10 mg Oral Daily   Chlorhexidine Gluconate Cloth  6 each Topical Q0600   megestrol  40 mg Oral Daily   mometasone-formoterol  2 puff Inhalation Q12H   pantoprazole  40 mg Oral Daily   sodium chloride flush  3 mL Intravenous Q12H   venlafaxine XR  300 mg Oral Q breakfast   Continuous Infusions:  sodium chloride       LOS: 2 days   Time spent: 27 minutes  Darliss Cheney, MD [facility]  10/20/2021, 2:18 PM  Please page via [place] and do not message via secure chat for anything urgent. Secure chat can be used for anything non urgent.  How to contact the [facility] Attending or Consulting provider [place] or covering provider during after hours [place], for this patient?  Check the care team in [facility] and look for a) attending/consulting TR[facility] provider listed and b) the [facility] team listed. Page or secure chat [phone]. Log into [email or web address] and use 's universal password to access. If you do not have the password, please contact the hospital operator. Locate the [facility] provider you are looking for  under [facility] and page to a number that you can be directly reached. If you still have difficulty reaching the provider, please page the [facility] (Director on Call) for the Hospitalists listed on amion for assistance.

## 2021-10-20 NOTE — Progress Notes (Signed)
[facility] Progress Note   Subjective:    Seen and examined patient at bedside. Denies SOB, CP, and N/V. Per patient, is awaiting family meeting with Palliative Care today for ongoing discussion on goals of care. Tolerated HD with net UF 1.8L. Plan for HD 1/21.  Objective Vitals:   10/19/21 2034 10/20/21 0530 10/20/21 0820 10/20/21 0826  BP: (!) 133/92 (!) 155/75 (!) 150/78   Pulse: (!) 102 100 (!) 106   Resp: 17 16 19    Temp: 99.8 F (37.7 C) 98.4 F (36.9 C) 98.3 F (36.8 C)   TempSrc: Oral Oral Oral   SpO2: 100% 96% 99% 97%  Weight:       Physical Exam General: Chronically ill-appearing; NAD Heart: S1 and S2; No murmurs, gallops, or rubs Lungs: Diminished at bases; clear upper lobes Abdomen: Soft and non-tender Extremities: No edema BLLE Dialysis Access: RIJ TDC; L AVG (+) Bruit/Thrill   Filed Weights   10/19/21 0910 10/19/21 1315  Weight: 39.8 kg 38.7 kg    Intake/Output Summary (Last 24 hours) at 10/20/2021 0923 Last data filed at 10/20/2021 0820 Gross per 24 hour  Intake 460 ml  Output 1877 ml  Net -1417 ml    Additional Objective Labs: Basic Metabolic Panel: Recent Labs  Lab 10/18/21 0936 10/18/21 0944 10/19/21 0108  NA 135 134* 137  K 4.1 4.1 4.2  CL 98 95* 99  CO2 27  --  25  GLUCOSE 101* 99 67*  BUN 34* 33* 36*  CREATININE 2.91* 3.10* 3.36*  CALCIUM 7.9*  --  8.5*   Liver Function Tests: Recent Labs  Lab 10/18/21 0936  AST 28  ALT 27  ALKPHOS 80  BILITOT 0.2*  PROT 5.1*  ALBUMIN 2.3*   No results for input(s): LIPASE, AMYLASE in the last 168 hours. CBC: Recent Labs  Lab 10/18/21 0936 10/18/21 0944 10/18/21 2219 10/19/21 0409  WBC 9.3  --   --  10.4  NEUTROABS 7.7  --   --   --   HGB 4.4* 5.1* 9.4* 9.1*  HCT 14.4* 15.0* 27.4* 26.9*  MCV 104.3*  --   --  90.0  PLT 381  --   --  368   Blood Culture    Component Value Date/Time   SDES FLUID PLEURAL 10/09/2021 1516   SDES FLUID PLEURAL 10/09/2021 1516    SPECREQUEST BOTTLES DRAWN AEROBIC AND ANAEROBIC 10/09/2021 1516   SPECREQUEST NONE 10/09/2021 1516   CULT  10/09/2021 1516    NO GROWTH 5 DAYS Performed at [facility], [place] [address] [place], [place] [address]    REPTSTATUS 10/14/2021 FINAL 10/09/2021 1516   REPTSTATUS 10/10/2021 FINAL 10/09/2021 1516    Cardiac Enzymes: No results for input(s): CKTOTAL, CKMB, CKMBINDEX, TROPONINI in the last 168 hours. CBG: No results for input(s): GLUCAP in the last 168 hours. Iron Studies: No results for input(s): IRON, TIBC, TRANSFERRIN, FERRITIN in the last 72 hours. Lab Results  Component Value Date   INR 0.9 08/15/2021   INR 0.9 06/10/2021   INR 1.0 08/24/2020   Studies/Results: No results found.  Medications:  sodium chloride      amLODipine  10 mg Oral Daily   Chlorhexidine Gluconate Cloth  6 each Topical Q0600   megestrol  40 mg Oral Daily   mometasone-formoterol  2 puff Inhalation Q12H   pantoprazole  40 mg Oral Daily   sodium chloride flush  3 mL Intravenous Q12H   venlafaxine XR  300 mg Oral  Q breakfast    Dialysis Orders: MWF GOC 3h 70min  36.5kg   3K/2.5 bath  Hep none  TDC/ LUA AVG done 09/14/21  - calcitriol 0.5 mcg po tiw  - mircera 75 mcg on 09/15/21   Assessment/Plan: Hematochezia/ABLA   - Hgb 4.4 on admission s/p 2 units. Hgb now 9.1. GI with no plans for further intervention at this time ESRD - HD MWF. Hd today and likely plan for HD again on Saturday 10/21/21. Resume MWF schedule next week. Access --Using AVG at OP center -will cannulate here. Has tdc in place HTN/volume  - Continue home meds. UF to EDW as tolerated.  Anemia of CKD - Got Aranesp 150 on 10/11/21. Plan to dose at next HD 10/21/21. MBD --Corr Ca at goal. Current phos at goal so can hold binders. Continue calcitriol. COPD -Unsure of her baseline respiratory status but suspect she has limited reserve. GOC: She has a lot of pain and overall debilitation.  Appreciate palliative care involvement  to help balance goals regarding comfort and medical care. Per patient, is suppose to have a family meeting with palliative care today.  Tobie Poet, NP [facility] 10/20/2021,9:23 AM  LOS: 2 days

## 2021-10-21 DIAGNOSIS — Z515 Encounter for palliative care: Secondary | ICD-10-CM

## 2021-10-21 LAB — RENAL FUNCTION PANEL
Albumin: 2.5 g/dL — ABNORMAL LOW (ref 3.5–5.0)
Anion gap: 10 (ref 5–15)
BUN: 45 mg/dL — ABNORMAL HIGH (ref 6–20)
CO2: 26 mmol/L (ref 22–32)
Calcium: 8.3 mg/dL — ABNORMAL LOW (ref 8.9–10.3)
Chloride: 97 mmol/L — ABNORMAL LOW (ref 98–111)
Creatinine, Ser: 4.11 mg/dL — ABNORMAL HIGH (ref 0.44–1.00)
GFR, Estimated: 12 mL/min — ABNORMAL LOW (ref >60)
Glucose, Bld: 107 mg/dL — ABNORMAL HIGH (ref 70–99)
Phosphorus: 5.1 mg/dL — ABNORMAL HIGH (ref 2.5–4.6)
Potassium: 4.1 mmol/L (ref 3.5–5.1)
Sodium: 133 mmol/L — ABNORMAL LOW (ref 135–145)

## 2021-10-21 LAB — CBC
HCT: 24.4 % — ABNORMAL LOW (ref 36.0–46.0)
Hemoglobin: 8 g/dL — ABNORMAL LOW (ref 12.0–15.0)
MCH: 30.8 pg (ref 26.0–34.0)
MCHC: 32.8 g/dL (ref 30.0–36.0)
MCV: 93.8 fL (ref 80.0–100.0)
Platelets: 325 10*3/uL (ref 150–400)
RBC: 2.6 MIL/uL — ABNORMAL LOW (ref 3.87–5.11)
RDW: 19.1 % — ABNORMAL HIGH (ref 11.5–15.5)
WBC: 8.4 10*3/uL (ref 4.0–10.5)
nRBC: 0 % (ref 0.0–0.2)

## 2021-10-21 NOTE — Progress Notes (Signed)
° °  Hydrologist [facility]) Hospital Liaison: RN note    Notified by Transition of Care Manger of patient/family request for [facility] services at home after discharge. Chart and patient information under review by [facility] physician. Hospice eligibility approved.     Colleague Venia Carbon spoke with patient to initiate education related to hospice philosophy, services and team approach to care. Keshia verbalized understanding of information given. Per discussion, plan is for discharge to home by PTAR.     Please send signed and completed DNR form home with patient/family. Patient will need prescriptions for discharge comfort medications.     DME needs have been discussed, patient currently has the following equipment in the home: PRN oxygen.  Patient/family requests the following DME for delivery to the home: none. Patient will discharge to her daughters address:  [address], [place] [address]. Eustaquio Maize is the family member to contact to arrange time of delivery.     [facility]erral Center aware of the above. Please notify [facility] when patient is ready to leave the unit at discharge. (Call [phone] or [phone] after 5pm.) [facility] information and contact numbers given to  [facility].      Please call with any hospice related questions.     Thank you for this referral.     Clementeen Hoof, BSN, [facility] (listed on [facility] under Hospice and [phone]  [phone]

## 2021-10-21 NOTE — TOC Progression Note (Addendum)
Transition of Care [facility]) - Progression Note    Patient Details  Name: [name] MRN: [id number] Date of Birth: [birth year]  Transition of Care [facility]) CM/SW Contact  Zenon Mayo, RN Phone Number: 10/21/2021, 11:48 AM  Clinical Narrative:    NCM got notified this patient is going home with Hospice today with [facility].  [name] is working with patient.  NCM spoke with Nurse, she states patient is on oxygen 3 liters chronically.  Her boyfriend is in the room who could transport her home.  Melissa with [facility] will call me back.  NCM spoke with daughter, she states patient is going to address of [address] [place] [address].  She will need oxygen tank to go home with.        Expected Discharge Plan and Services                                                 Social Determinants of Health (SDOH) Interventions    Readmission Risk Interventions Readmission Risk Prevention Plan 10/04/2021  Transportation Screening Complete  Medication Review [employer]) Complete  PCP or Specialist appointment within 3-5 days of discharge Complete  HRI or [place] Complete  SW Recovery Care/Counseling Consult Complete  [place] Not Applicable  Some recent data might be hidden

## 2021-10-21 NOTE — Discharge Summary (Signed)
[place] Discharge Summary  [name]:[id number] DOB: [birth year] DOA: 10/18/2021  PCP: Bernerd Limbo, MD  Admit date: 10/18/2021 Discharge date: 10/21/2021 30 Day Unplanned Readmission Risk Score    Flowsheet Row ED to Hosp-Admission (Current) from 10/18/2021 in [place]  30 Day Unplanned Readmission Risk Score (%) 56.51 Filed at 10/21/2021 1200       This score is the patient's risk of an unplanned readmission within 30 days of being discharged (0 -100%). The score is based on dignosis, age, lab data, medications, orders, and past utilization.   Low:  0-14.9   Medium: 15-21.9   High: 22-29.9   Extreme: 30 and above          Admitted From: Home Disposition: Home with hospice  Recommendations for Outpatient Follow-up:  Follow up with PCP in 1-2 weeks Please obtain BMP/CBC in one week Please follow up with your PCP on the following pending results: Unresulted Labs (From admission, onward)     Start     Ordered   10/19/21 0920  Resp Panel by RT-PCR (Flu A&B, Covid) Nasopharyngeal Swab  (Tier 2 - Symptomatic/asymptomatic)  Once,   R        10/19/21 0919              Home Health: None Equipment/Devices: None  Discharge Condition: Guarded CODE STATUS: DNR Diet recommendation: Regular or what ever patient pleased with  Subjective: Seen and examined.  She has no complaints.  Multiple family members at the bedside including daughter, 2 grandsons and 1 grand daughter-in-law.  Patient is in good spirits.  She has made her decision to go home with hospice.  Brief/Interim Summary: [name] is a 61 y.o. female with medical history significant of end-stage renal disease on HD, MWF, CAD, AS, bipolar disorder, chronic mesenteric ischemia, status post SMA aortic and bilateral common iliac stenting, status post colon resection, chronic small bowel AVMs comes in with bright red blood per rectum and melanotic stool for the last  2 days.  Patient has frequent GI bleeds and has required multiple transfusions in the past. Patient found to have a hemoglobin of 4.4, whereas it was 7.6, 7 days ago.  Patient referred for admission for acute GI bleed.     Acute blood loss anemia/hematochezia/history of chronic small bowel AVMs and possible ischemic colitis: She has history of  mesenteric ischemia s/p SMA stent.  Received 2 unit appears a transfusion, hemoglobin 8.8.  Seen by GI.  Due to patient having multiple scopes done, GI decided not to pursue any further endoscopies during this hospitalization.  Patient's hemoglobin has remained stable since she received 2 units of PRBC transfusion.   ESRD on HD: Nephrology on board.  She received her dialysis here.  She was due for dialysis today but she refused that.   Essential hypertension: Controlled.  Continue amlodipine.   GOC: Patient decided to forego dialysis and instead go home with hospice.  She had a meeting with the palliative care as well as nephrologist today.  She declined dialysis today.  She does not need any DME.  She wants to go home today.  She is being discharged.  Discharge plan was discussed with all the family members at bedside.  Discharge Diagnoses:  Principal Problem:   Acute blood loss anemia Active Problems:   Acute GI bleeding   GIB (gastrointestinal bleeding)   Angiodysplasia of colon   Chronic mesenteric ischemia (HCC)   Symptomatic anemia  COPD (chronic obstructive pulmonary disease) ([place])   Encounter for home hospice care   Chronic pain disorder    Discharge Instructions   Allergies as of 10/21/2021       Reactions   Doxycycline Anaphylaxis, Hives   Hydralazine Shortness Of Breath, Rash   Patient reports "couldn't breath"   Aspirin Other (See Comments)   Internal bleeding- MD SAID to not take this   Hydrocodone Nausea And Vomiting   Ibuprofen Other (See Comments)   Caused internal bleeding   Tylenol [acetaminophen] Other (See Comments)    MD told the patient to not take this   Iohexol Itching, Other (See Comments)   Pt has itching nose after iv contrast injection        Medication List     TAKE these medications    albuterol 108 (90 Base) MCG/ACT inhaler Commonly known as: VENTOLIN HFA Inhale 2 puffs into the lungs every 6 (six) hours as needed for wheezing or shortness of breath (asthma).   albuterol (2.5 MG/3ML) 0.083% nebulizer solution Commonly known as: PROVENTIL Take 3 mLs (2.5 mg total) by nebulization every 2 (two) hours as needed for shortness of breath.   ALPRAZolam 1 MG tablet Commonly known as: XANAX Take 1 mg by mouth 2 (two) times daily as needed for anxiety.   amLODipine 10 MG tablet Commonly known as: NORVASC TAKE 1 TABLET BY MOUTH ONCE DAILY   ascorbic acid 500 MG tablet Commonly known as: VITAMIN C Take 1 tablet (500 mg total) by mouth 2 (two) times daily.   atorvastatin 80 MG tablet Commonly known as: LIPITOR Take 80 mg by mouth at bedtime.   azelastine 0.1 % nasal spray Commonly known as: ASTELIN Place 1 spray into both nostrils daily as needed for allergies (seasonal allergies).   CALCIUM 600 + D PO Take 1 tablet by mouth daily.   calcium acetate 667 MG capsule Commonly known as: PHOSLO Take 1 capsule (667 mg total) by mouth 3 (three) times daily with meals.   cetirizine 10 MG tablet Commonly known as: ZYRTEC Take 10 mg by mouth daily as needed for allergies.   Ensure Take 237 mLs by mouth 2 (two) times daily between meals.   ezetimibe 10 MG tablet Commonly known as: ZETIA TAKE 1 TABLET BY MOUTH ONCE A DAY   folic acid 1 MG tablet Commonly known as: FOLVITE Take 1 tablet (1 mg total) by mouth daily.   guaiFENesin 100 MG/5ML liquid Commonly known as: ROBITUSSIN Take 5 mLs by mouth every 4 (four) hours as needed for cough or to loosen phlegm.   isosorbide mononitrate 60 MG 24 hr tablet Commonly known as: IMDUR Take 1 tablet (60 mg total) by mouth daily.    labetalol 100 MG tablet Commonly known as: NORMODYNE Take 200 mg by mouth 2 (two) times daily.   Linzess 72 MCG capsule Generic drug: linaclotide TAKE 1 CAPSULE BY MOUTH ONCE DAILY BEFORE BREAKFAST What changed: See the new instructions.   losartan 50 MG tablet Commonly known as: COZAAR Take 1 tablet (50 mg total) by mouth daily.   megestrol 40 MG tablet Commonly known as: MEGACE Take 40 mg by mouth daily.   mometasone-formoterol 100-5 MCG/ACT Aero Commonly known as: DULERA Take 2 puffs first thing in am and then another 2 puffs about 12 hours later. What changed:  how much to take how to take this when to take this additional instructions   multivitamin Tabs tablet Take 1 tablet by mouth at bedtime.  nitroGLYCERIN 0.4 MG SL tablet Commonly known as: NITROSTAT Place 1 tablet (0.4 mg total) under the tongue every 5 (five) minutes as needed for chest pain.   ondansetron 4 MG disintegrating tablet Commonly known as: ZOFRAN-ODT Take 1 tablet (4 mg total) by mouth every 8 (eight) hours as needed for nausea or vomiting. What changed:  when to take this additional instructions   oxyCODONE-acetaminophen 5-325 MG tablet Commonly known as: PERCOCET/ROXICET Take 1 tablet by mouth 2 (two) times daily as needed for pain.   pantoprazole 40 MG tablet Commonly known as: PROTONIX Take 1 tablet (40 mg total) by mouth daily.   polyethylene glycol powder 17 GM/SCOOP powder Commonly known as: MiraLax Take 17 g by mouth 2 (two) times daily as needed for moderate constipation or mild constipation.   predniSONE 20 MG tablet Commonly known as: DELTASONE Take 2 tablets (40 mg total) by mouth daily with breakfast.   promethazine 25 MG tablet Commonly known as: PHENERGAN Take 25 mg by mouth every 6 (six) hours as needed for nausea or vomiting. Alternate with Ondansetron   traZODone 100 MG tablet Commonly known as: DESYREL Take 50-100 mg by mouth at bedtime as needed for sleep.    venlafaxine XR 150 MG 24 hr capsule Commonly known as: Effexor XR Take 2 capsules (300 mg total) by mouth daily with breakfast.   vitamin B-12 500 MCG tablet Commonly known as: CYANOCOBALAMIN Take 500 mcg by mouth daily.        Follow-up Information     Bernerd Limbo, MD Follow up in 1 week(s).   Specialty: Family Medicine Contact information: [address] [place] [address] [phone]                Allergies  Allergen Reactions   Doxycycline Anaphylaxis and Hives   Hydralazine Shortness Of Breath and Rash    Patient reports "couldn't breath"   Aspirin Other (See Comments)    Internal bleeding- MD SAID to not take this   Hydrocodone Nausea And Vomiting   Ibuprofen Other (See Comments)    Caused internal bleeding   Tylenol [Acetaminophen] Other (See Comments)    MD told the patient to not take this   Iohexol Itching and Other (See Comments)    Pt has itching nose after iv contrast injection    Consultations: Nephrology and GI   Procedures/Studies: DG Chest 1 View  Result Date: 10/09/2021 CLINICAL DATA:  Status post right thoracentesis. EXAM: CHEST  1 VIEW COMPARISON:  October 08, 2021. FINDINGS: No pneumothorax status post right thoracentesis. Minimal right pleural effusion is noted. IMPRESSION: No pneumothorax status post right thoracentesis. Electronically Signed   By: Marijo Conception M.D.   On: 10/09/2021 15:05   DG Chest 2 View  Result Date: 10/08/2021 CLINICAL DATA:  Respiratory distress.  Cough. EXAM: CHEST - 2 VIEW COMPARISON:  10/04/2021, CT 10/01/2021 FINDINGS: Right-sided dialysis catheter remains in place. Grossly unchanged cardiomegaly. Worsening right but slight improvement in left pleural effusion, as well as associated bibasilar opacities. Diffuse interstitial opacities suspicious for pulmonary edema, mildly improved. No pneumothorax. IMPRESSION: 1. Worsening right but slight improvement in left pleural effusion, as well as  associated basilar opacities. 2. Stable cardiomegaly. Mildly improved interstitial opacities likely improving edema. Electronically Signed   By: Keith Rake M.D.   On: 10/08/2021 17:37   CT Angio Chest Pulmonary Embolism (PE) W or WO Contrast  Result Date: 10/01/2021 CLINICAL DATA:  61 year old female with history of positive D-dimer.  Evaluate for pulmonary embolism. EXAM: CT ANGIOGRAPHY CHEST WITH CONTRAST TECHNIQUE: Multidetector CT imaging of the chest was performed using the standard protocol during bolus administration of intravenous contrast. Multiplanar CT image reconstructions and MIPs were obtained to evaluate the vascular anatomy. CONTRAST:  49mL OMNIPAQUE IOHEXOL 350 MG/ML SOLN COMPARISON:  No priors. FINDINGS: Cardiovascular: Study is limited by considerable patient respiratory motion. With these limitations in mind, there is no central, lobar or segmental sized pulmonary embolism. Smaller subsegmental sized emboli cannot be completely excluded. Heart size is mildly enlarged. There is no significant pericardial fluid, thickening or pericardial calcification. There is aortic atherosclerosis, as well as atherosclerosis of the great vessels of the mediastinum and the coronary arteries, including calcified atherosclerotic plaque in the left main, left anterior descending and right coronary arteries. Calcifications of the mitral annulus. Mediastinum/Nodes: No pathologically enlarged mediastinal or hilar lymph nodes. Esophagus is unremarkable in appearance. No axillary lymphadenopathy. Lungs/Pleura: Small bilateral pleural effusions lying dependently with some areas of passive subsegmental atelectasis in the lower lobes of the lungs bilaterally. No acute consolidative airspace disease. Mild diffuse bronchial wall thickening with mild centrilobular and paraseptal emphysema. Upper Abdomen: Atherosclerotic calcifications are noted in the abdominal aorta. Musculoskeletal: Old healed fracture of the right  mid clavicle with fibrous union and chronic posttraumatic deformity. There are no aggressive appearing lytic or blastic lesions noted in the visualized portions of the skeleton. Review of the MIP images confirms the above findings. IMPRESSION: 1. Mildly limited study demonstrating no evidence of clinically significant central, lobar or segmental sized pulmonary embolism. 2. Small bilateral pleural effusions with some passive subsegmental atelectasis in the lower lobes of the lungs bilaterally. 3. Cardiomegaly. 4. Aortic atherosclerosis, in addition to left main and 2 vessel coronary artery disease. Please note that although the presence of coronary artery calcium documents the presence of coronary artery disease, the severity of this disease and any potential stenosis cannot be assessed on this non-gated CT examination. Assessment for potential risk factor modification, dietary therapy or pharmacologic therapy may be warranted, if clinically indicated. 5. Mild diffuse bronchial wall thickening with mild centrilobular and paraseptal emphysema; imaging findings suggestive of underlying COPD. Aortic Atherosclerosis (ICD10-I70.0) and Emphysema (ICD10-J43.9). Electronically Signed   By: Vinnie Langton M.D.   On: 10/01/2021 13:39   DG Chest Port 1 View  Result Date: 10/04/2021 CLINICAL DATA:  Shortness of breath.  Respiratory distress. EXAM: PORTABLE CHEST 1 VIEW COMPARISON:  Chest x-ray 10/06/2021. FINDINGS: Dialysis catheter in stable position. Stable cardiomegaly. Diffuse bilateral pulmonary infiltrates/edema again noted. Small bilateral pleural effusions again noted. Chest is unchanged from prior exam. No pneumothorax. IMPRESSION: 1.  Dialysis catheter in stable position. 2. Stable cardiomegaly. Diffuse bilateral pulmonary infiltrates/edema again noted. Small bilateral pleural effusions again noted. No interim change from prior exam. Electronically Signed   By: Marcello Moores  Register M.D.   On: 10/04/2021 06:39   DG  Chest Portable 1 View  Result Date: 10/03/2021 CLINICAL DATA:  Dyspnea, COPD exacerbation EXAM: PORTABLE CHEST 1 VIEW COMPARISON:  09/29/2021 FINDINGS: Single frontal view of the chest demonstrates stable right internal jugular dialysis catheter. Cardiac silhouette is enlarged but stable. Increased central vascular congestion, as well as progressive interstitial and ground-glass opacities, bibasilar consolidation, and bilateral pleural effusions. No pneumothorax. IMPRESSION: 1. Findings most consistent with worsening volume status and progressive pulmonary edema. Electronically Signed   By: Randa Ngo M.D.   On: 10/03/2021 20:40   DG Chest Port 1 View  Result Date: 09/29/2021 CLINICAL DATA:  chest pain,  SOB EXAM: PORTABLE CHEST 1 VIEW COMPARISON:  09/18/2021 chest radiograph. FINDINGS: Right internal jugular central venous catheter terminates over the cavoatrial junction. Stable cardiomediastinal silhouette with mild cardiomegaly. No pneumothorax. Small bilateral pleural effusions, left greater than right, stable. Cephalization of the pulmonary vasculature without overt pulmonary edema. Patchy bibasilar lung consolidation with associated volume loss, most prominent at the medial lung bases, unchanged. IMPRESSION: 1. Stable cardiomegaly without overt pulmonary edema. 2. Stable small bilateral pleural effusions, left greater than right. 3. Stable patchy bibasilar lung consolidation with associated volume loss, favor atelectasis, difficult to exclude a component of pneumonia. Electronically Signed   By: Ilona Sorrel M.D.   On: 09/29/2021 08:23   IR THORACENTESIS ASP PLEURAL SPACE W/IMG GUIDE  Result Date: 10/09/2021 INDICATION: Patient history of end-stage renal disease on hemodialysis, COPD. Admitted for respiratory failure found to have flash pulmonary edema with small right-sided pleural effusion. Request is for therapeutic and diagnostic thoracentesis. EXAM: ULTRASOUND GUIDED DIAGNOSTIC AND THERAPEUTIC  RIGHT-SIDED THORACENTESIS MEDICATIONS: Lidocaine 1% 10 mL COMPLICATIONS: None immediate. PROCEDURE: An ultrasound guided thoracentesis was thoroughly discussed with the patient and questions answered. The benefits, risks, alternatives and complications were also discussed. The patient understands and wishes to proceed with the procedure. Written consent was obtained. Ultrasound was performed to localize and mark an adequate pocket of fluid in the right chest. The area was then prepped and draped in the normal sterile fashion. 1% Lidocaine was used for local anesthesia. Under ultrasound guidance a 19 gauge, 7-cm, Yueh catheter was introduced. Thoracentesis was performed. The catheter was removed and a dressing applied. FINDINGS: A total of approximately 280 mL of straw-colored fluid was removed. Samples were sent to the laboratory as requested by the clinical team. IMPRESSION: Successful ultrasound guided diagnostic and therapeutic right-sided thoracentesis yielding 280 mL of pleural fluid. Read by: Rushie Nyhan, NP Electronically Signed   By: Sandi Mariscal M.D.   On: 10/09/2021 15:18     Discharge Exam: Vitals:   10/21/21 0744 10/21/21 0906  BP: (!) 171/78   Pulse: (!) 102 (!) 103  Resp: 16 18  Temp: 98.6 F (37 C)   SpO2: 95% 96%   Vitals:   10/20/21 2032 10/21/21 0537 10/21/21 0744 10/21/21 0906  BP: (!) 160/76 (!) 162/75 (!) 171/78   Pulse: 98 (!) 101 (!) 102 (!) 103  Resp: 18 18 16 18   Temp: 98.5 F (36.9 C) 98.3 F (36.8 C) 98.6 F (37 C)   TempSrc: Oral Oral Oral   SpO2: 98% 99% 95% 96%  Weight:        General: Pt is alert, awake, not in acute distress Cardiovascular: RRR, S1/S2 +, no rubs, no gallops Respiratory: CTA bilaterally, no wheezing, no rhonchi Abdominal: Soft, NT, ND, bowel sounds + Extremities: no edema, no cyanosis    The results of significant diagnostics from this hospitalization (including imaging, microbiology, ancillary and laboratory) are listed below  for reference.     Microbiology: No results found for this or any previous visit (from the past 240 hour(s)).   Labs: BNP (last 3 results) Recent Labs    08/16/21 0740  BNP 284.1*   Basic Metabolic Panel: Recent Labs  Lab 10/18/21 0936 10/18/21 0944 10/19/21 0108 10/21/21 0617  NA 135 134* 137 133*  K 4.1 4.1 4.2 4.1  CL 98 95* 99 97*  CO2 27  --  25 26  GLUCOSE 101* 99 67* 107*  BUN 34* 33* 36* 45*  CREATININE 2.91* 3.10* 3.36* 4.11*  CALCIUM  7.9*  --  8.5* 8.3*  PHOS  --   --   --  5.1*   Liver Function Tests: Recent Labs  Lab 10/18/21 0936 10/21/21 0617  AST 28  --   ALT 27  --   ALKPHOS 80  --   BILITOT 0.2*  --   PROT 5.1*  --   ALBUMIN 2.3* 2.5*   No results for input(s): LIPASE, AMYLASE in the last 168 hours. No results for input(s): AMMONIA in the last 168 hours. CBC: Recent Labs  Lab 10/18/21 0936 10/18/21 0944 10/18/21 2219 10/19/21 0409 10/20/21 1022 10/21/21 0617  WBC 9.3  --   --  10.4 9.1 8.4  NEUTROABS 7.7  --   --   --  7.3  --   HGB 4.4* 5.1* 9.4* 9.1* 8.8* 8.0*  HCT 14.4* 15.0* 27.4* 26.9* 26.0* 24.4*  MCV 104.3*  --   --  90.0 92.2 93.8  PLT 381  --   --  368 344 325   Cardiac Enzymes: No results for input(s): CKTOTAL, CKMB, CKMBINDEX, TROPONINI in the last 168 hours. BNP: Invalid input(s): POCBNP CBG: No results for input(s): GLUCAP in the last 168 hours. D-Dimer No results for input(s): DDIMER in the last 72 hours. Hgb A1c No results for input(s): HGBA1C in the last 72 hours. Lipid Profile No results for input(s): CHOL, HDL, LDLCALC, TRIG, CHOLHDL, LDLDIRECT in the last 72 hours. Thyroid function studies No results for input(s): TSH, T4TOTAL, T3FREE, THYROIDAB in the last 72 hours.  Invalid input(s): FREET3 Anemia work up No results for input(s): VITAMINB12, FOLATE, FERRITIN, TIBC, IRON, RETICCTPCT in the last 72 hours. Urinalysis    Component Value Date/Time   COLORURINE YELLOW 08/20/2021 0140   APPEARANCEUR CLEAR  08/20/2021 0140   APPEARANCEUR Cloudy (A) 03/22/2021 1432   LABSPEC 1.012 08/20/2021 0140   LABSPEC 1.016 06/07/2012 1350   PHURINE 5.0 08/20/2021 0140   GLUCOSEU 50 (A) 08/20/2021 0140   GLUCOSEU Negative 06/07/2012 1350   HGBUR SMALL (A) 08/20/2021 0140   BILIRUBINUR NEGATIVE 08/20/2021 0140   BILIRUBINUR Negative 03/22/2021 1432   BILIRUBINUR Negative 06/07/2012 1350   KETONESUR NEGATIVE 08/20/2021 0140   PROTEINUR 100 (A) 08/20/2021 0140   UROBILINOGEN 0.2 06/28/2014 1518   NITRITE NEGATIVE 08/20/2021 0140   LEUKOCYTESUR NEGATIVE 08/20/2021 0140   LEUKOCYTESUR Negative 06/07/2012 1350   Sepsis Labs Invalid input(s): PROCALCITONIN,  WBC,  LACTICIDVEN Microbiology No results found for this or any previous visit (from the past 240 hour(s)).   Time coordinating discharge: Over 30 minutes  SIGNED:   Darliss Cheney, MD  [facility] 10/21/2021, 12:24 PM  If 7PM-7AM, please contact night-coverage [email or web address]

## 2021-10-21 NOTE — Progress Notes (Signed)
Mobility Specialist Progress Note:   10/21/21 1030  Mobility  Activity Ambulated independently in room  Level of Assistance Independent  Assistive Device None  Distance Ambulated (ft) 100 ft  Activity Response Tolerated well  $Mobility charge 1 Mobility   Pt agreeable to ambulate in room. Became tearful talking about prognosis/palliative meeting this morning. Pt successfully redirected, left in good spirits sitting EOB.   Nelta Numbers Mobility Specialist  Phone [phone]

## 2021-10-21 NOTE — Consult Note (Signed)
Palliative Medicine Inpatient Consult Note  Consulting Provider: Delfina Redwood  Reason for consult:   [facility] Consult  Reason for Consult? GOC.  Multiple hospitalizations due to GI bleed.   Please let patient know when you will be visiting, she wants her family with her for the discussion. Patient would like to discuss stopping dialysis/hospice   HPI:  Per intake H&P --> [name] is a 61 y.o. female with medical history significant of end-stage renal disease on HD, MWF, CAD, AS, bipolar disorder, chronic mesenteric ischemia, status post SMA aortic and bilateral common iliac stenting, status post colon resection, chronic small bowel AVMs comes in with bright red blood per rectum and melanotic stool for the last 2 days.  Patient has frequent GI bleeds and has required multiple transfusions in the past.  She denies any fevers.  She denies any nausea vomiting.  She has had at least 6-10 bowel movements that have been bloody over the last 48 hours.  She denies any pain.  Patient found to have a hemoglobin of 4.4, 7 days ago was 7.6.  Patient referred for admission for acute GI bleed.    Palliative care has been asked to get involved to discuss possibility of stopping dialysis.   Clinical Assessment/Goals of Care:  *Please note that this is a verbal dictation therefore any spelling or grammatical errors are due to the "[facility]" system interpretation.  I have reviewed medical records including EPIC notes, labs and imaging, received report from bedside RN, assessed the patient who is lying in bed in NAD.    I met with [name] to further discuss diagnosis prognosis, GOC, EOL wishes, disposition and options.   I introduced Palliative Medicine as specialized medical care for people living with serious illness. It focuses on providing relief from the symptoms and stress of a serious illness. The goal is to improve quality  of life for both the patient and the family.  Medical History Review and Understanding:  [name] shares the knowledge of her medical conditions inclusive of her end-stage renal disease, coronary artery disease, and recurrent gastrointestinal bleeding.  [name] expresses to me that since she is started dialysis her quality of life has been poor and it has caused her a great deal of pain and suffering.  She expresses to me that she does not know if she can continue with this measure as she does not feel its improving the quality of life she is living.  Social History:  We discussed a brief life review of the patient.  [name] has a boyfriend who she has been with for the past 18 years.  She has one daughter, who she does not have a good relationship with. She tells me her daughter has struggled with substance abuse for many years. She has 4 grandsons by this daughter. She essentially raised the oldest 2 of these grandsons, who are now ages 17 and 25.    Functional and Nutritional State:  [name] lives in her home with her 66 year old grandson and her significant other.   As far as functional status, she is ambulatory but uses a wheelchair to get around her house. She reports it is becoming increasingly difficult to care for herself. She wants to hire a private caregiver to come to her house 2-3 days per week for a few hours  Advance Directives: A detailed discussion was had today regarding advanced directives.  No, she has never completed a  living will or healthcare power of attorney documents.  Code Status: Concepts specific to code status were reviewed.  She is a DO NOT RESUSCITATE/DO NOT INTUBATE CODE STATUS.  Goals for the Future:  To stop dialysis.  To be out of pain.  To not suffer.  Plan to discuss with family at [place].   Discussed the importance of continued conversation with family and their  medical providers regarding overall plan of care and treatment options, ensuring  decisions are within the context of the patients values and GOCs. ____________________________________________  Addendum #1  10AM Meeting -->   I met with [name], her boyfriend, and Dr. Jonnie Finner and Tobie Poet of the nephrology service. Dr. Jonnie Finner and [name] reviewed that her propensity to get volume overloaded is very high and that she is not a candidate for hemodialysis twice a week. Reviewed that she has given dialysis three months of a trial but her body has proven that she can not longer tolerate this.   Reviewed the idea of a comfort focused approach which would entail stopping hemodialysis. She was informed that her time would be short but ideally relief of all pain and suffering would be pursued during that time.  The topic of hospice was broached.   I described hospice as a service for patients who have a life expectancy of 6 months or less.   The goal of hospice is the preservation of dignity and quality at the end phases of life.   Under hospice care, the focus changes from curative to symptom relief.   Patient is interested in hospice though wants her daughter and grandsons to be in favor of this as well. They are > 1 hour late for the meeting.  [name] reviews that the hardest part of leaving this world is no longer being here for the two grandsons she raised. I offered support through therapeutic listening.   ___________________________________________________  Addendum #2  I met with patients daughter and two grandsons at bedside. We reviewed the above information. Discussed the plan for transitioning home on hospice to make the most of what little time she has left.  Patients daughter overwhelmed with emotion - excused herself for sometime. Provided support through a sympathetic ear.  We are all in agreement with the pursuit of home with hospice.  Decision Maker: Patient can make decisions for herself.  SUMMARY OF RECOMMENDATIONS   DNAR/DNI  Patient  would like to stop HD treatments  Patient is interested in going home with hospice  Appreciate TOC arranging home hospice at patients daughters home  Ongoing Palliative support until discharge.  Code Status/Advance Care Planning: DNAR/DNI   Symptom Management:   Palliative Prophylaxis:  Aspiration, Bowel Regimen, Delirium Protocol, Frequent Pain Assessment, Oral Care, Palliative Wound Care, and Turn Reposition  Additional Recommendations (Limitations, Scope, Preferences): Home with hospice. End of life care.  Psycho-social/Spiritual:  Desire for further Chaplaincy support: No Additional Recommendations: Education on end of life with ESRD   Prognosis: Very poor. Limited to < 2 weeks  Discharge Planning: Discharge to daughters home with hospice.  Vitals:   10/21/21 0744 10/21/21 0906  BP: (!) 171/78   Pulse: (!) 102 (!) 103  Resp: 16 18  Temp: 98.6 F (37 C)   SpO2: 95% 96%    Intake/Output Summary (Last 24 hours) at 10/21/2021 0955 Last data filed at 10/20/2021 1825 Gross per 24 hour  Intake 590 ml  Output --  Net 590 ml   Last Weight  Most recent update: 10/19/2021  2:26 PM    Weight  38.7 kg (85 lb 5.1 oz)            Gen:  NAD HEENT: moist mucous membranes CV: Regular rate and rhythm PULM: clear to auscultation bilaterally ABD: soft/nontender EXT: No edema Neuro: Alert and oriented x3  PPS: 60%   This conversation/these recommendations were discussed with patient primary care team, Dr. Doristine Bosworth  Total Time: 164 minutes  MDM High  Medical Decision Making:4 #/Complex Problems: 4                     Data Reviewed: 4                Management:4 (1-Straightforward, 2-Low, 3-Moderate, 4-High) ______________________________________________________ [facility] Team Team Cell Phone: [phone] Please utilize secure chat with additional questions, if there is no response within 30 minutes please call the above  phone number  Palliative Medicine Team providers are available by phone from 7am to 7pm daily and can be reached through the team cell phone.  Should this patient require assistance outside of these hours, please call the patient's attending physician.

## 2021-10-21 NOTE — Progress Notes (Signed)
[facility] Progress Note   Subjective:   pt wants to go home , no more dialysis, had long discussion today w/ Pall care team and the pt  Objective Vitals:   10/20/21 2032 10/21/21 0537 10/21/21 0744 10/21/21 0906  BP: (!) 160/76 (!) 162/75 (!) 171/78   Pulse: 98 (!) 101 (!) 102 (!) 103  Resp: 18 18 16 18   Temp: 98.5 F (36.9 C) 98.3 F (36.8 C) 98.6 F (37 C)   TempSrc: Oral Oral Oral   SpO2: 98% 99% 95% 96%  Weight:       Physical Exam General: Chronically ill-appearing; NAD Heart: S1 and S2; No murmurs, gallops, or rubs Lungs: Diminished at bases; clear upper lobes Abdomen: Soft and non-tender Extremities: No edema BLLE Dialysis Access: RIJ TDC; L AVG (+) Bruit/Thrill   Filed Weights   10/19/21 0910 10/19/21 1315  Weight: 39.8 kg 38.7 kg    Intake/Output Summary (Last 24 hours) at 10/21/2021 1408 Last data filed at 10/20/2021 1825 Gross per 24 hour  Intake 590 ml  Output --  Net 590 ml     Additional Objective Labs: Basic Metabolic Panel: Recent Labs  Lab 10/18/21 0936 10/18/21 0944 10/19/21 0108 10/21/21 0617  NA 135 134* 137 133*  K 4.1 4.1 4.2 4.1  CL 98 95* 99 97*  CO2 27  --  25 26  GLUCOSE 101* 99 67* 107*  BUN 34* 33* 36* 45*  CREATININE 2.91* 3.10* 3.36* 4.11*  CALCIUM 7.9*  --  8.5* 8.3*  PHOS  --   --   --  5.1*    Liver Function Tests: Recent Labs  Lab 10/18/21 0936 10/21/21 0617  AST 28  --   ALT 27  --   ALKPHOS 80  --   BILITOT 0.2*  --   PROT 5.1*  --   ALBUMIN 2.3* 2.5*    No results for input(s): LIPASE, AMYLASE in the last 168 hours. CBC: Recent Labs  Lab 10/18/21 0936 10/18/21 0944 10/19/21 0409 10/20/21 1022 10/21/21 0617  WBC 9.3  --  10.4 9.1 8.4  NEUTROABS 7.7  --   --  7.3  --   HGB 4.4*   < > 9.1* 8.8* 8.0*  HCT 14.4*   < > 26.9* 26.0* 24.4*  MCV 104.3*  --  90.0 92.2 93.8  PLT 381  --  368 344 325   < > = values in this interval not displayed.    Blood Culture    Component Value  Date/Time   SDES FLUID PLEURAL 10/09/2021 1516   SDES FLUID PLEURAL 10/09/2021 1516   SPECREQUEST BOTTLES DRAWN AEROBIC AND ANAEROBIC 10/09/2021 1516   SPECREQUEST NONE 10/09/2021 1516   CULT  10/09/2021 1516    NO GROWTH 5 DAYS Performed at [facility], [place] [address] [place], [place] [address]    REPTSTATUS 10/14/2021 FINAL 10/09/2021 1516   REPTSTATUS 10/10/2021 FINAL 10/09/2021 1516    Cardiac Enzymes: No results for input(s): CKTOTAL, CKMB, CKMBINDEX, TROPONINI in the last 168 hours. CBG: No results for input(s): GLUCAP in the last 168 hours. Iron Studies: No results for input(s): IRON, TIBC, TRANSFERRIN, FERRITIN in the last 72 hours. Lab Results  Component Value Date   INR 0.9 08/15/2021   INR 0.9 06/10/2021   INR 1.0 08/24/2020   Studies/Results: No results found.  Medications:  sodium chloride      amLODipine  10 mg Oral Daily   Chlorhexidine Gluconate Cloth  6 each  Topical Q0600   megestrol  40 mg Oral Daily   mometasone-formoterol  2 puff Inhalation Q12H   pantoprazole  40 mg Oral Daily   sodium chloride flush  3 mL Intravenous Q12H   venlafaxine XR  300 mg Oral Q breakfast    Dialysis Orders: MWF GOC 3h 65min  36.5kg   3K/2.5 bath  Hep none  TDC/ LUA AVG done 09/14/21  - calcitriol 0.5 mcg po tiw  - mircera 75 mcg on 09/15/21   Assessment/Plan: ESRD - HD MWF. Patient is stopping dialysis, she has not tolerated it well. She is going home w/ hospice/ comfort care.  If she wants her HD cath removed (cannot be done over the weekend) she should call her OP HD unit and we can arrange for removal during the week.  No further HD, comfort care. Will sign off.  Anemia of CKD  MBD ckd COPD - poor reserve, frequent admits since starting HD for resp distress GOC: as above  Kelly Splinter, MD 10/21/2021, 2:11 PM

## 2021-10-21 NOTE — Progress Notes (Addendum)
Patient declining to go to Hemodialysis today. MD made aware and planning to reach out to family to set up meeting ASAP.

## 2021-10-21 NOTE — Progress Notes (Signed)
SATURATION QUALIFICATIONS: (This note is used to comply with regulatory documentation for home oxygen)  Patient Saturations on Room Air at Rest = 97%  Patient Saturations on Room Air while Ambulating = 93%  

## 2021-10-21 NOTE — Progress Notes (Signed)
Patient and daughter present for discharge teaching, patient eager to go home. Has already heard that someone will be by at 1400 on Monday to remove tunneled catheter. Patient OK to transport home without oxygen. Daughter providing transportation home.

## 2021-10-27 ENCOUNTER — Other Ambulatory Visit (HOSPITAL_COMMUNITY): Payer: Self-pay | Admitting: Hematology

## 2021-10-27 DIAGNOSIS — N186 End stage renal disease: Secondary | ICD-10-CM

## 2021-10-30 ENCOUNTER — Inpatient Hospital Stay (HOSPITAL_COMMUNITY): Admission: RE | Admit: 2021-10-30 | Payer: Medicare Other | Source: Ambulatory Visit

## 2021-11-01 DEATH — deceased

## 2021-11-09 ENCOUNTER — Encounter (HOSPITAL_COMMUNITY): Payer: Self-pay

## 2021-11-22 ENCOUNTER — Ambulatory Visit: Payer: Medicare Other | Admitting: Gastroenterology

## 2022-12-31 IMAGING — CT CT ABD-PELV W/O CM
2 of 4 series · 16 of 46 positions shown, 18 images · non-contrast
Comparison: 07/25/2021

CLINICAL DATA: Acute abdominal pain with rectal bleeding, initial
encounter

EXAM:
CT ABDOMEN AND PELVIS WITHOUT CONTRAST
TECHNIQUE: Multidetector CT imaging of the abdomen and pelvis was performed
following the standard protocol without IV contrast.

[Series 2: axial st · axial · 0.66mm/px · z∈[+790,+1110]mm · 13 of 72 slices shown, 15 images]
[im 4/72  soft-tissue]
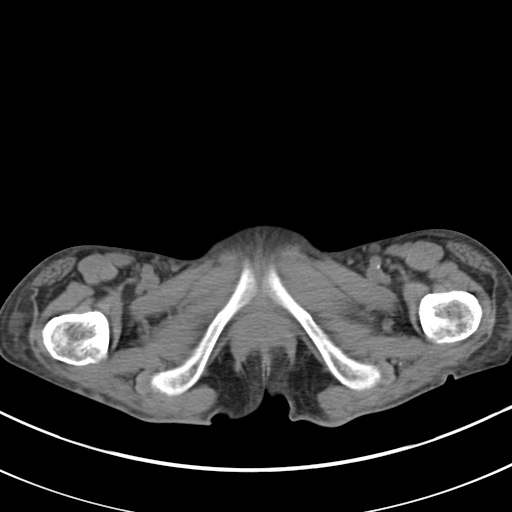
[im 4/72  bone]
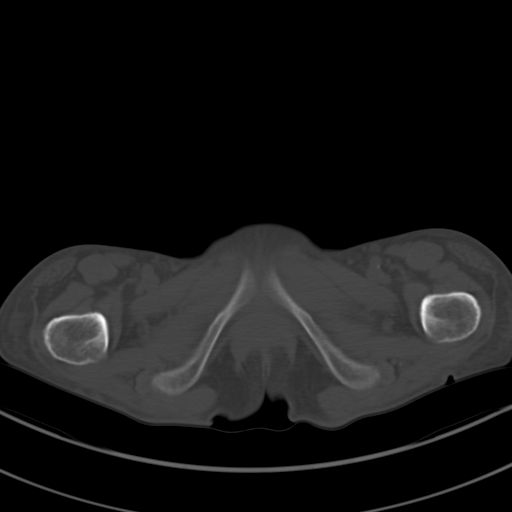
[im 11/72  soft-tissue]
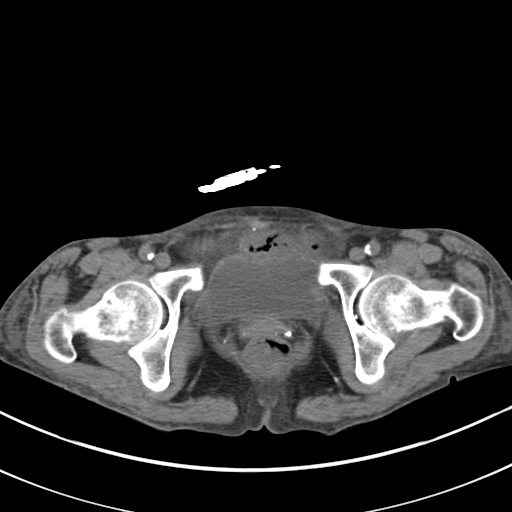
[im 14/72  soft-tissue]
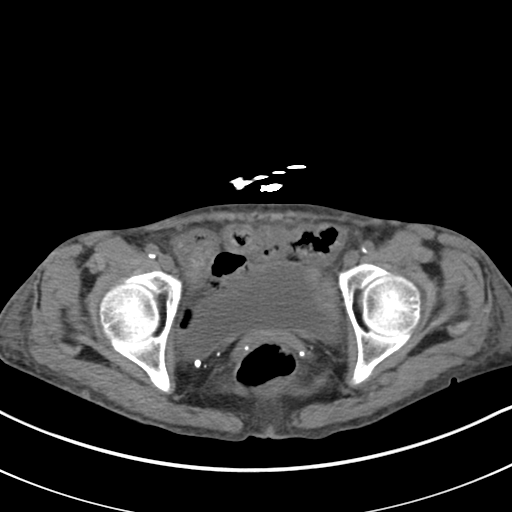
[im 21/72  soft-tissue]
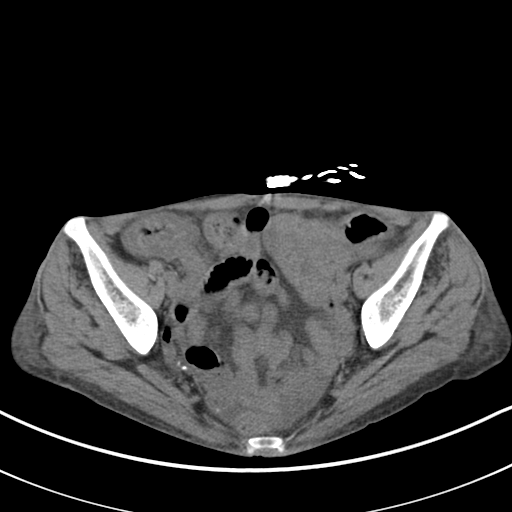
[im 24/72  soft-tissue]
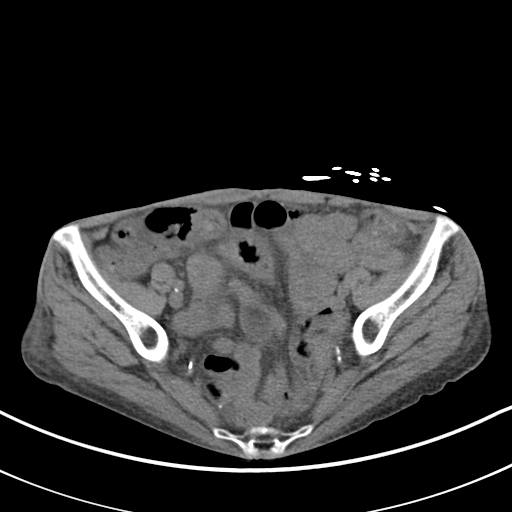
[im 31/72  soft-tissue]
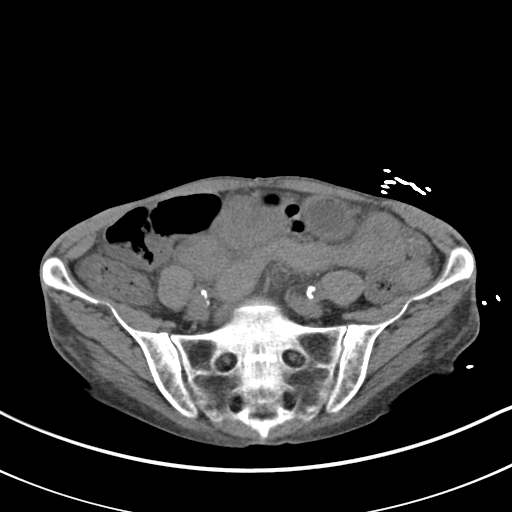
[im 38/72  soft-tissue]
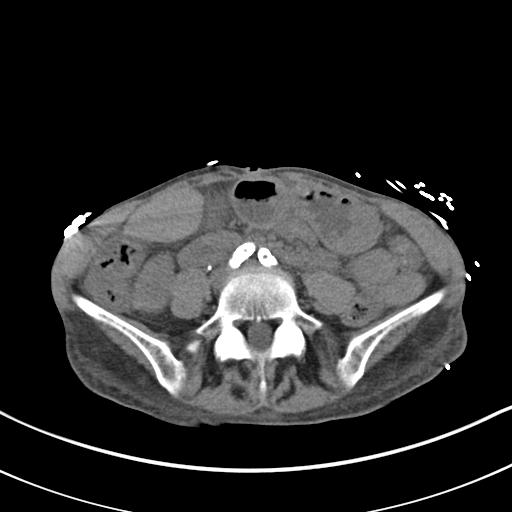
[im 41/72  soft-tissue]
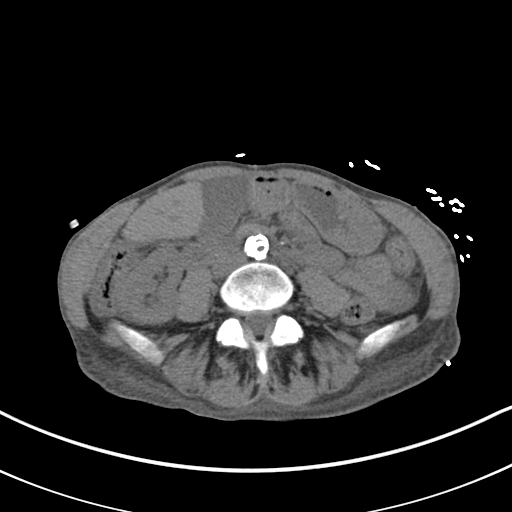
[im 48/72  soft-tissue]
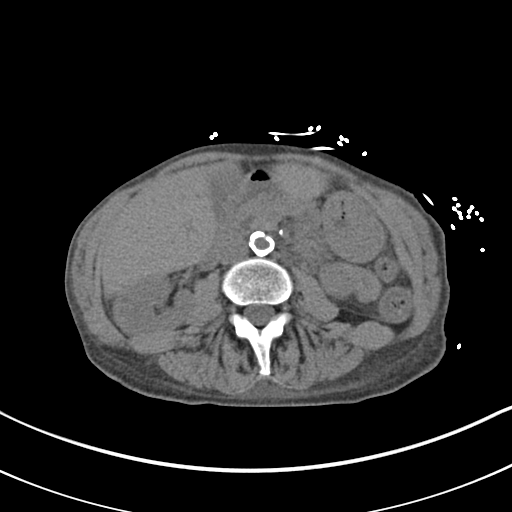
[im 48/72  bone]
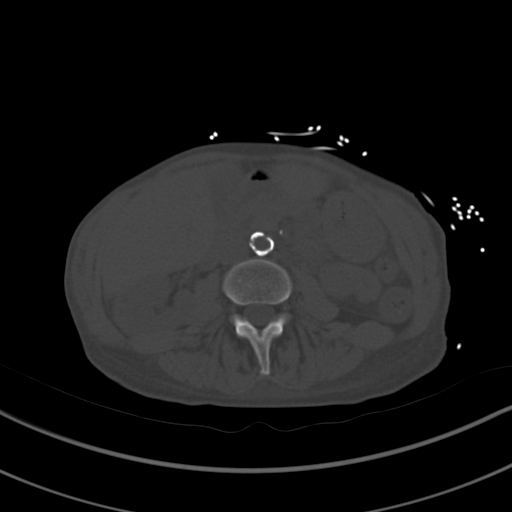
[im 51/72  soft-tissue]
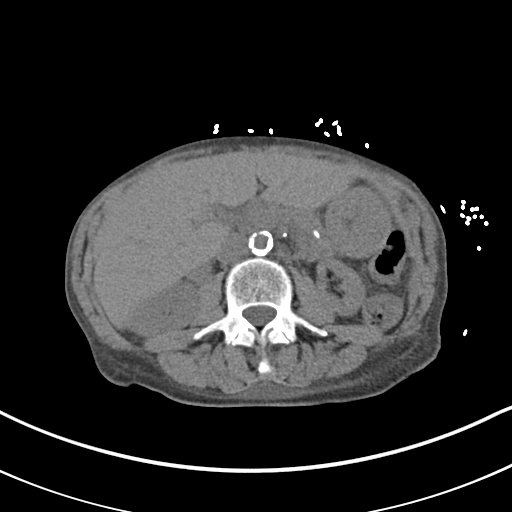
[im 58/72  soft-tissue]
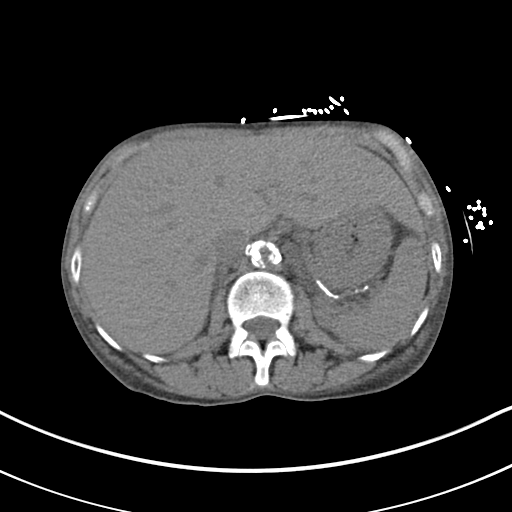
[im 61/72  soft-tissue]
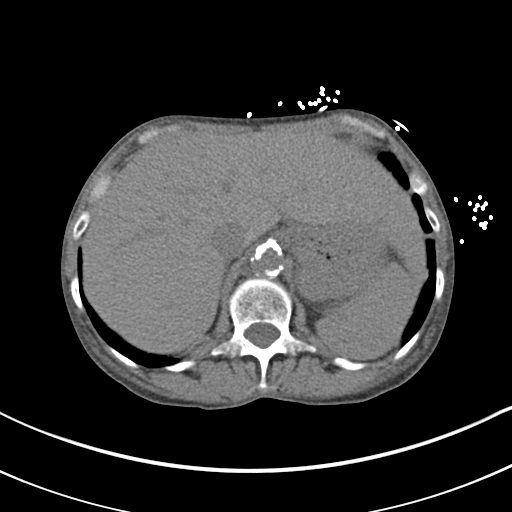
[im 68/72  soft-tissue]
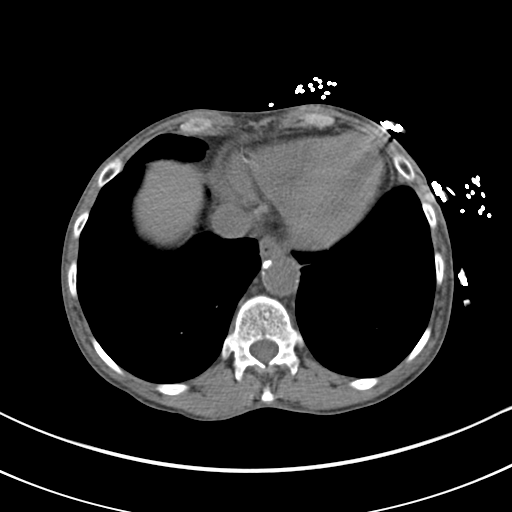

[Series 4: coronal st · coronal · 0.63mm/px · 3 of 115 slices shown]
[im 39/115  soft-tissue]
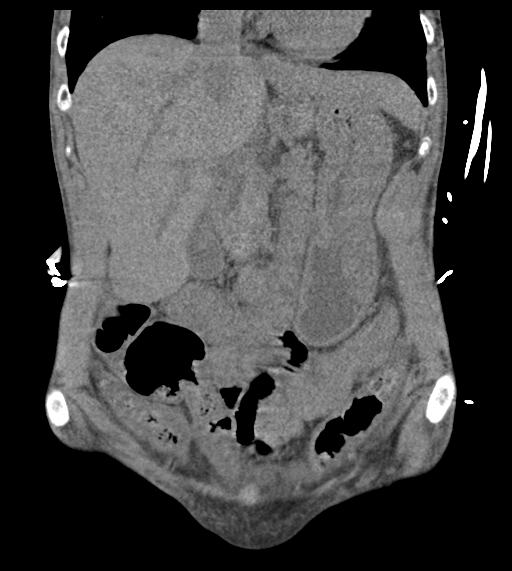
[im 51/115  soft-tissue]
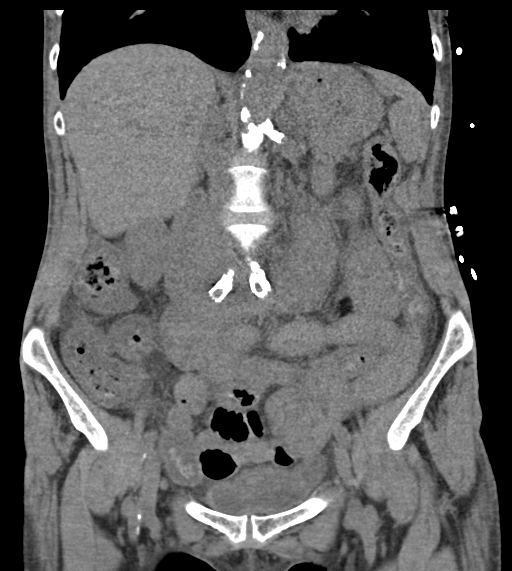
[im 64/115  soft-tissue]
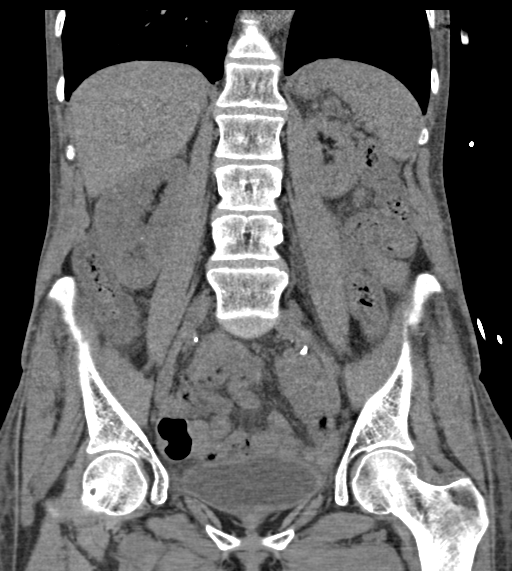

[16 of 46 positions shown; findings below may reference images not displayed]

FINDINGS: Lower chest: Mild atelectatic changes are noted in the left lung
base.

Hepatobiliary: No focal liver abnormality is seen. No gallstones,
gallbladder wall thickening, or biliary dilatation.

Pancreas: Unremarkable. No pancreatic ductal dilatation or
surrounding inflammatory changes.

Spleen: Normal in size without focal abnormality.

Adrenals/Urinary Tract: Adrenal glands are within normal limits. No
renal calculi or obstructive changes are seen. The bladder is
partially distended.

Stomach/Bowel: Mild wall thickening is noted within the colon which
may be related to incomplete distension although the possibility of
mild colitis could not be totally excluded given the patient's
clinical history. Changes of prior appendectomy are seen. Small
bowel and stomach appear within normal limits.

Vascular/Lymphatic: Diffuse atherosclerotic calcifications are
noted. Kissing iliac stents are seen. No significant lymphadenopathy
is noted.

Reproductive: Status post hysterectomy. No adnexal masses.

Other: No abdominal wall hernia or abnormality. No abdominopelvic
ascites.

Musculoskeletal: No acute or significant osseous findings.
IMPRESSION: Mild wall thickening within the colon as described. This may
represent some mild colitis.

No other focal abnormality is noted.

## 2022-12-31 IMAGING — DX DG CHEST 1V PORT
1 series · 1 of 1 positions shown · non-contrast
Comparison: August 15, 2021.

CLINICAL DATA: Chest pain.

EXAM:
PORTABLE CHEST 1 VIEW

[chest ap]
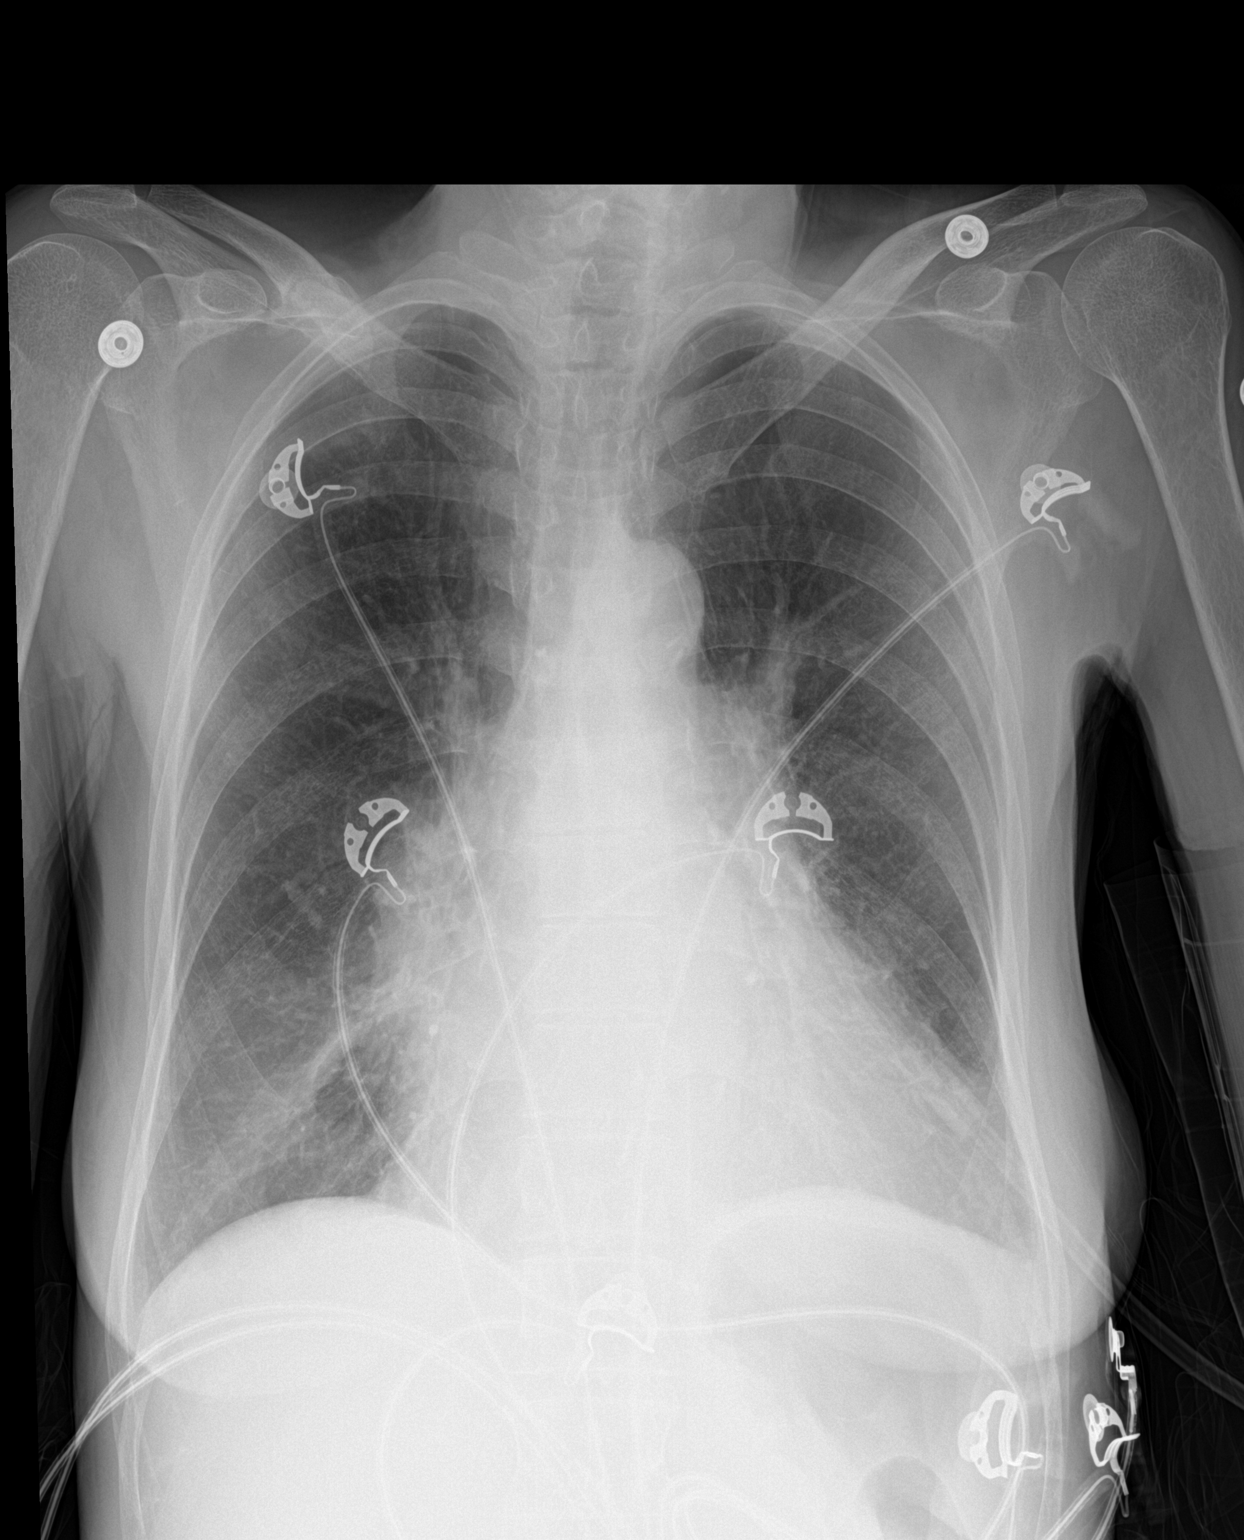

[1 of 1 positions shown; findings below may reference images not displayed]

FINDINGS: Stable cardiomegaly. No pneumothorax or pleural effusion is noted.
Left lung is clear. Stable right infrahilar opacity is noted
concerning for atelectasis or infiltrate. Bony thorax is
unremarkable.
IMPRESSION: Stable right infrahilar opacity is noted concerning for atelectasis
or possibly infiltrate.

## 2023-05-15 NOTE — Telephone Encounter (Signed)
Done
